# Patient Record
Sex: Female | Born: 1960 | Race: Black or African American | Hispanic: No | Smoking: Never smoker
Health system: Southern US, Community
[De-identification: ages and names within clinical notes are randomized; demographics above are authoritative.]

## PROBLEM LIST (undated history)

## (undated) DIAGNOSIS — K449 Diaphragmatic hernia without obstruction or gangrene: Secondary | ICD-10-CM

## (undated) DIAGNOSIS — K589 Irritable bowel syndrome without diarrhea: Secondary | ICD-10-CM

## (undated) DIAGNOSIS — K219 Gastro-esophageal reflux disease without esophagitis: Secondary | ICD-10-CM

## (undated) DIAGNOSIS — H66009 Acute suppurative otitis media without spontaneous rupture of ear drum, unspecified ear: Secondary | ICD-10-CM

## (undated) DIAGNOSIS — E8881 Metabolic syndrome: Secondary | ICD-10-CM

## (undated) DIAGNOSIS — M1990 Unspecified osteoarthritis, unspecified site: Secondary | ICD-10-CM

## (undated) DIAGNOSIS — I2510 Atherosclerotic heart disease of native coronary artery without angina pectoris: Secondary | ICD-10-CM

## (undated) DIAGNOSIS — D649 Anemia, unspecified: Secondary | ICD-10-CM

## (undated) DIAGNOSIS — T7840XA Allergy, unspecified, initial encounter: Secondary | ICD-10-CM

## (undated) DIAGNOSIS — R0600 Dyspnea, unspecified: Secondary | ICD-10-CM

## (undated) DIAGNOSIS — R160 Hepatomegaly, not elsewhere classified: Secondary | ICD-10-CM

## (undated) DIAGNOSIS — I5033 Acute on chronic diastolic (congestive) heart failure: Secondary | ICD-10-CM

## (undated) DIAGNOSIS — U071 COVID-19: Secondary | ICD-10-CM

## (undated) DIAGNOSIS — G43009 Migraine without aura, not intractable, without status migrainosus: Secondary | ICD-10-CM

## (undated) DIAGNOSIS — F419 Anxiety disorder, unspecified: Secondary | ICD-10-CM

## (undated) DIAGNOSIS — I5032 Chronic diastolic (congestive) heart failure: Secondary | ICD-10-CM

## (undated) DIAGNOSIS — G4733 Obstructive sleep apnea (adult) (pediatric): Secondary | ICD-10-CM

## (undated) DIAGNOSIS — K648 Other hemorrhoids: Secondary | ICD-10-CM

## (undated) DIAGNOSIS — J189 Pneumonia, unspecified organism: Secondary | ICD-10-CM

## (undated) DIAGNOSIS — J329 Chronic sinusitis, unspecified: Secondary | ICD-10-CM

## (undated) DIAGNOSIS — G4730 Sleep apnea, unspecified: Secondary | ICD-10-CM

## (undated) DIAGNOSIS — G5600 Carpal tunnel syndrome, unspecified upper limb: Secondary | ICD-10-CM

## (undated) DIAGNOSIS — I10 Essential (primary) hypertension: Secondary | ICD-10-CM

## (undated) DIAGNOSIS — I5030 Unspecified diastolic (congestive) heart failure: Secondary | ICD-10-CM

## (undated) DIAGNOSIS — K5792 Diverticulitis of intestine, part unspecified, without perforation or abscess without bleeding: Secondary | ICD-10-CM

## (undated) DIAGNOSIS — Z8679 Personal history of other diseases of the circulatory system: Secondary | ICD-10-CM

## (undated) DIAGNOSIS — E785 Hyperlipidemia, unspecified: Secondary | ICD-10-CM

## (undated) DIAGNOSIS — J449 Chronic obstructive pulmonary disease, unspecified: Secondary | ICD-10-CM

## (undated) DIAGNOSIS — U099 Post covid-19 condition, unspecified: Secondary | ICD-10-CM

## (undated) DIAGNOSIS — K760 Fatty (change of) liver, not elsewhere classified: Secondary | ICD-10-CM

## (undated) HISTORY — DX: Fatty (change of) liver, not elsewhere classified: K76.0

## (undated) HISTORY — DX: Allergy, unspecified, initial encounter: T78.40XA

## (undated) HISTORY — DX: Morbid (severe) obesity due to excess calories: E66.01

## (undated) HISTORY — DX: Gastro-esophageal reflux disease without esophagitis: K21.9

## (undated) HISTORY — DX: Chronic diastolic (congestive) heart failure: I50.32

## (undated) HISTORY — DX: Unspecified diastolic (congestive) heart failure: I50.30

## (undated) HISTORY — DX: Essential (primary) hypertension: I10

## (undated) HISTORY — DX: Anxiety disorder, unspecified: F41.9

## (undated) HISTORY — PX: UVULOPALATOPHARYNGOPLASTY: SHX827

## (undated) HISTORY — PX: TONSILLECTOMY: SUR1361

## (undated) HISTORY — PX: TUBAL LIGATION: SHX77

## (undated) HISTORY — PX: PARTIAL HYSTERECTOMY: SHX80

## (undated) HISTORY — DX: Atherosclerotic heart disease of native coronary artery without angina pectoris: I25.10

## (undated) HISTORY — DX: Carpal tunnel syndrome, unspecified upper limb: G56.00

## (undated) HISTORY — DX: Irritable bowel syndrome, unspecified: K58.9

## (undated) HISTORY — PX: NASAL SINUS SURGERY: SHX719

## (undated) HISTORY — DX: Obstructive sleep apnea (adult) (pediatric): G47.33

## (undated) HISTORY — PX: ABDOMINAL HYSTERECTOMY: SHX81

## (undated) HISTORY — DX: Diverticulitis of intestine, part unspecified, without perforation or abscess without bleeding: K57.92

## (undated) HISTORY — DX: Other hemorrhoids: K64.8

## (undated) HISTORY — DX: Unspecified osteoarthritis, unspecified site: M19.90

## (undated) HISTORY — DX: Diaphragmatic hernia without obstruction or gangrene: K44.9

## (undated) HISTORY — DX: Hepatomegaly, not elsewhere classified: R16.0

## (undated) HISTORY — PX: COLONOSCOPY: SHX174

## (undated) HISTORY — DX: COVID-19: U07.1

## (undated) HISTORY — DX: Hyperlipidemia, unspecified: E78.5

## (undated) HISTORY — DX: Anemia, unspecified: D64.9

## (undated) HISTORY — DX: Migraine without aura, not intractable, without status migrainosus: G43.009

## (undated) SURGERY — Surgical Case
Anesthesia: *Unknown

---

## 1996-06-22 ENCOUNTER — Encounter: Payer: Self-pay | Admitting: Internal Medicine

## 1997-07-06 ENCOUNTER — Emergency Department (HOSPITAL_COMMUNITY): Admission: EM | Admit: 1997-07-06 | Discharge: 1997-07-06 | Payer: Self-pay | Admitting: Emergency Medicine

## 1997-08-07 ENCOUNTER — Emergency Department (HOSPITAL_COMMUNITY): Admission: EM | Admit: 1997-08-07 | Discharge: 1997-08-07 | Payer: Self-pay

## 1998-02-11 ENCOUNTER — Emergency Department (HOSPITAL_COMMUNITY): Admission: EM | Admit: 1998-02-11 | Discharge: 1998-02-11 | Payer: Self-pay | Admitting: Emergency Medicine

## 1998-04-05 ENCOUNTER — Emergency Department (HOSPITAL_COMMUNITY): Admission: EM | Admit: 1998-04-05 | Discharge: 1998-04-05 | Payer: Self-pay | Admitting: Emergency Medicine

## 1998-04-20 ENCOUNTER — Emergency Department (HOSPITAL_COMMUNITY): Admission: EM | Admit: 1998-04-20 | Discharge: 1998-04-20 | Payer: Self-pay | Admitting: Emergency Medicine

## 1998-04-20 ENCOUNTER — Encounter: Payer: Self-pay | Admitting: Emergency Medicine

## 1998-05-06 ENCOUNTER — Emergency Department (HOSPITAL_COMMUNITY): Admission: EM | Admit: 1998-05-06 | Discharge: 1998-05-06 | Payer: Self-pay | Admitting: Emergency Medicine

## 1998-06-07 ENCOUNTER — Emergency Department (HOSPITAL_COMMUNITY): Admission: EM | Admit: 1998-06-07 | Discharge: 1998-06-07 | Payer: Self-pay | Admitting: Emergency Medicine

## 1998-08-18 ENCOUNTER — Encounter: Payer: Self-pay | Admitting: Endocrinology

## 1998-08-18 ENCOUNTER — Emergency Department (HOSPITAL_COMMUNITY): Admission: EM | Admit: 1998-08-18 | Discharge: 1998-08-18 | Payer: Self-pay | Admitting: Emergency Medicine

## 1998-11-23 ENCOUNTER — Encounter: Payer: Self-pay | Admitting: Internal Medicine

## 1998-11-23 ENCOUNTER — Encounter: Admission: RE | Admit: 1998-11-23 | Discharge: 1998-11-23 | Payer: Self-pay | Admitting: Internal Medicine

## 1999-04-01 ENCOUNTER — Emergency Department (HOSPITAL_COMMUNITY): Admission: EM | Admit: 1999-04-01 | Discharge: 1999-04-01 | Payer: Self-pay | Admitting: Emergency Medicine

## 1999-05-27 ENCOUNTER — Emergency Department (HOSPITAL_COMMUNITY): Admission: EM | Admit: 1999-05-27 | Discharge: 1999-05-27 | Payer: Self-pay | Admitting: Emergency Medicine

## 1999-06-19 ENCOUNTER — Emergency Department (HOSPITAL_COMMUNITY): Admission: EM | Admit: 1999-06-19 | Discharge: 1999-06-19 | Payer: Self-pay | Admitting: Internal Medicine

## 1999-11-29 ENCOUNTER — Ambulatory Visit (HOSPITAL_COMMUNITY): Admission: RE | Admit: 1999-11-29 | Discharge: 1999-11-30 | Payer: Self-pay | Admitting: *Deleted

## 1999-11-29 ENCOUNTER — Encounter: Payer: Self-pay | Admitting: *Deleted

## 1999-12-04 ENCOUNTER — Ambulatory Visit (HOSPITAL_BASED_OUTPATIENT_CLINIC_OR_DEPARTMENT_OTHER): Admission: RE | Admit: 1999-12-04 | Discharge: 1999-12-04 | Payer: Self-pay | Admitting: Internal Medicine

## 2000-01-19 ENCOUNTER — Emergency Department (HOSPITAL_COMMUNITY): Admission: EM | Admit: 2000-01-19 | Discharge: 2000-01-19 | Payer: Self-pay | Admitting: *Deleted

## 2000-01-20 ENCOUNTER — Emergency Department (HOSPITAL_COMMUNITY): Admission: EM | Admit: 2000-01-20 | Discharge: 2000-01-20 | Payer: Self-pay | Admitting: *Deleted

## 2000-01-31 ENCOUNTER — Emergency Department (HOSPITAL_COMMUNITY): Admission: EM | Admit: 2000-01-31 | Discharge: 2000-02-01 | Payer: Self-pay | Admitting: Emergency Medicine

## 2000-01-31 ENCOUNTER — Encounter: Payer: Self-pay | Admitting: Emergency Medicine

## 2001-04-13 ENCOUNTER — Encounter: Payer: Self-pay | Admitting: Internal Medicine

## 2001-04-13 ENCOUNTER — Ambulatory Visit (HOSPITAL_COMMUNITY): Admission: RE | Admit: 2001-04-13 | Discharge: 2001-04-13 | Payer: Self-pay | Admitting: Internal Medicine

## 2001-05-19 ENCOUNTER — Encounter: Payer: Self-pay | Admitting: Otolaryngology

## 2001-05-19 ENCOUNTER — Encounter: Admission: RE | Admit: 2001-05-19 | Discharge: 2001-05-19 | Payer: Self-pay | Admitting: Otolaryngology

## 2002-02-24 ENCOUNTER — Encounter: Admission: RE | Admit: 2002-02-24 | Discharge: 2002-02-24 | Payer: Self-pay | Admitting: Otolaryngology

## 2002-02-24 ENCOUNTER — Encounter: Payer: Self-pay | Admitting: Otolaryngology

## 2002-05-10 ENCOUNTER — Encounter: Payer: Self-pay | Admitting: Endocrinology

## 2002-05-10 ENCOUNTER — Ambulatory Visit (HOSPITAL_COMMUNITY): Admission: RE | Admit: 2002-05-10 | Discharge: 2002-05-10 | Payer: Self-pay | Admitting: Endocrinology

## 2002-07-19 ENCOUNTER — Encounter: Admission: RE | Admit: 2002-07-19 | Discharge: 2002-08-17 | Payer: Self-pay | Admitting: Orthopedic Surgery

## 2003-01-17 ENCOUNTER — Ambulatory Visit (HOSPITAL_BASED_OUTPATIENT_CLINIC_OR_DEPARTMENT_OTHER): Admission: RE | Admit: 2003-01-17 | Discharge: 2003-01-17 | Payer: Self-pay | Admitting: Critical Care Medicine

## 2003-01-17 ENCOUNTER — Encounter: Payer: Self-pay | Admitting: Pulmonary Disease

## 2003-04-23 ENCOUNTER — Emergency Department (HOSPITAL_COMMUNITY): Admission: EM | Admit: 2003-04-23 | Discharge: 2003-04-23 | Payer: Self-pay | Admitting: Emergency Medicine

## 2003-04-29 ENCOUNTER — Encounter (INDEPENDENT_AMBULATORY_CARE_PROVIDER_SITE_OTHER): Payer: Self-pay | Admitting: *Deleted

## 2003-04-29 ENCOUNTER — Ambulatory Visit (HOSPITAL_COMMUNITY): Admission: RE | Admit: 2003-04-29 | Discharge: 2003-04-30 | Payer: Self-pay | Admitting: Otolaryngology

## 2003-11-04 ENCOUNTER — Encounter: Payer: Self-pay | Admitting: Internal Medicine

## 2003-11-25 ENCOUNTER — Encounter: Admission: RE | Admit: 2003-11-25 | Discharge: 2003-11-25 | Payer: Self-pay | Admitting: Internal Medicine

## 2003-12-12 ENCOUNTER — Ambulatory Visit: Payer: Self-pay | Admitting: Endocrinology

## 2003-12-14 ENCOUNTER — Ambulatory Visit: Payer: Self-pay | Admitting: Endocrinology

## 2004-01-07 ENCOUNTER — Ambulatory Visit: Payer: Self-pay | Admitting: Family Medicine

## 2004-01-09 ENCOUNTER — Ambulatory Visit: Payer: Self-pay | Admitting: Endocrinology

## 2004-02-10 ENCOUNTER — Ambulatory Visit: Payer: Self-pay | Admitting: Endocrinology

## 2004-02-27 ENCOUNTER — Ambulatory Visit: Payer: Self-pay | Admitting: Endocrinology

## 2004-03-12 ENCOUNTER — Ambulatory Visit (HOSPITAL_COMMUNITY): Admission: RE | Admit: 2004-03-12 | Discharge: 2004-03-12 | Payer: Self-pay | Admitting: Internal Medicine

## 2004-03-12 ENCOUNTER — Ambulatory Visit: Payer: Self-pay | Admitting: Internal Medicine

## 2004-03-30 ENCOUNTER — Ambulatory Visit: Payer: Self-pay | Admitting: Cardiology

## 2004-03-31 ENCOUNTER — Ambulatory Visit: Payer: Self-pay | Admitting: Internal Medicine

## 2004-04-16 ENCOUNTER — Ambulatory Visit: Payer: Self-pay | Admitting: Cardiology

## 2004-04-23 ENCOUNTER — Ambulatory Visit: Payer: Self-pay | Admitting: Endocrinology

## 2004-05-16 ENCOUNTER — Encounter: Admission: RE | Admit: 2004-05-16 | Discharge: 2004-05-16 | Payer: Self-pay | Admitting: Endocrinology

## 2004-06-08 ENCOUNTER — Ambulatory Visit: Payer: Self-pay | Admitting: Endocrinology

## 2004-06-15 ENCOUNTER — Ambulatory Visit: Payer: Self-pay | Admitting: Cardiology

## 2004-06-28 ENCOUNTER — Ambulatory Visit: Payer: Self-pay | Admitting: Internal Medicine

## 2004-06-29 ENCOUNTER — Ambulatory Visit: Payer: Self-pay

## 2004-06-29 ENCOUNTER — Ambulatory Visit: Payer: Self-pay | Admitting: Cardiology

## 2004-07-06 ENCOUNTER — Ambulatory Visit: Payer: Self-pay | Admitting: Cardiology

## 2004-07-06 ENCOUNTER — Ambulatory Visit: Payer: Self-pay

## 2004-07-09 ENCOUNTER — Ambulatory Visit: Payer: Self-pay | Admitting: Cardiology

## 2004-07-16 ENCOUNTER — Ambulatory Visit: Payer: Self-pay | Admitting: Cardiology

## 2004-07-23 ENCOUNTER — Ambulatory Visit: Payer: Self-pay | Admitting: Cardiology

## 2004-07-31 ENCOUNTER — Ambulatory Visit: Payer: Self-pay | Admitting: Cardiology

## 2004-08-03 ENCOUNTER — Ambulatory Visit: Payer: Self-pay | Admitting: Endocrinology

## 2004-08-10 ENCOUNTER — Ambulatory Visit: Payer: Self-pay | Admitting: Endocrinology

## 2004-08-10 ENCOUNTER — Ambulatory Visit: Payer: Self-pay

## 2004-08-15 ENCOUNTER — Ambulatory Visit: Payer: Self-pay | Admitting: Cardiology

## 2004-08-17 ENCOUNTER — Ambulatory Visit: Payer: Self-pay | Admitting: Cardiology

## 2004-08-27 ENCOUNTER — Ambulatory Visit: Payer: Self-pay

## 2004-09-07 ENCOUNTER — Ambulatory Visit: Payer: Self-pay | Admitting: Pulmonary Disease

## 2004-09-10 ENCOUNTER — Ambulatory Visit: Payer: Self-pay | Admitting: Internal Medicine

## 2004-11-14 ENCOUNTER — Ambulatory Visit: Payer: Self-pay | Admitting: Endocrinology

## 2004-11-28 ENCOUNTER — Ambulatory Visit: Payer: Self-pay | Admitting: Endocrinology

## 2004-12-07 ENCOUNTER — Ambulatory Visit: Payer: Self-pay | Admitting: Cardiology

## 2005-01-14 ENCOUNTER — Emergency Department (HOSPITAL_COMMUNITY): Admission: EM | Admit: 2005-01-14 | Discharge: 2005-01-14 | Payer: Self-pay | Admitting: Emergency Medicine

## 2005-01-15 ENCOUNTER — Ambulatory Visit: Payer: Self-pay | Admitting: Endocrinology

## 2005-02-26 ENCOUNTER — Ambulatory Visit: Payer: Self-pay | Admitting: Endocrinology

## 2005-02-28 ENCOUNTER — Ambulatory Visit: Payer: Self-pay | Admitting: Internal Medicine

## 2005-03-11 ENCOUNTER — Ambulatory Visit: Payer: Self-pay | Admitting: Internal Medicine

## 2005-04-20 ENCOUNTER — Ambulatory Visit: Payer: Self-pay | Admitting: Family Medicine

## 2005-05-21 ENCOUNTER — Ambulatory Visit: Payer: Self-pay | Admitting: Internal Medicine

## 2005-06-07 ENCOUNTER — Ambulatory Visit: Payer: Self-pay | Admitting: Cardiology

## 2005-06-12 ENCOUNTER — Ambulatory Visit: Payer: Self-pay | Admitting: Endocrinology

## 2005-06-19 ENCOUNTER — Ambulatory Visit: Payer: Self-pay | Admitting: Cardiology

## 2005-08-02 ENCOUNTER — Ambulatory Visit: Payer: Self-pay | Admitting: Endocrinology

## 2005-08-07 ENCOUNTER — Ambulatory Visit: Payer: Self-pay | Admitting: Endocrinology

## 2005-08-23 ENCOUNTER — Ambulatory Visit: Payer: Self-pay | Admitting: Pulmonary Disease

## 2005-08-26 ENCOUNTER — Ambulatory Visit: Payer: Self-pay | Admitting: Internal Medicine

## 2005-08-30 ENCOUNTER — Ambulatory Visit: Payer: Self-pay | Admitting: Internal Medicine

## 2005-09-03 ENCOUNTER — Ambulatory Visit: Payer: Self-pay

## 2005-09-20 ENCOUNTER — Ambulatory Visit: Payer: Self-pay | Admitting: Cardiology

## 2005-11-11 ENCOUNTER — Ambulatory Visit: Payer: Self-pay | Admitting: Endocrinology

## 2005-12-11 ENCOUNTER — Ambulatory Visit: Payer: Self-pay | Admitting: Cardiology

## 2006-01-11 ENCOUNTER — Ambulatory Visit: Payer: Self-pay | Admitting: Internal Medicine

## 2006-01-13 ENCOUNTER — Ambulatory Visit: Payer: Self-pay | Admitting: Internal Medicine

## 2006-03-03 ENCOUNTER — Ambulatory Visit: Payer: Self-pay | Admitting: Endocrinology

## 2006-03-29 ENCOUNTER — Ambulatory Visit: Payer: Self-pay | Admitting: Family Medicine

## 2006-04-14 ENCOUNTER — Ambulatory Visit: Payer: Self-pay | Admitting: Endocrinology

## 2006-05-12 ENCOUNTER — Ambulatory Visit: Payer: Self-pay | Admitting: Endocrinology

## 2006-06-09 ENCOUNTER — Encounter: Payer: Self-pay | Admitting: Endocrinology

## 2006-06-09 DIAGNOSIS — G4733 Obstructive sleep apnea (adult) (pediatric): Secondary | ICD-10-CM | POA: Insufficient documentation

## 2006-06-09 DIAGNOSIS — I10 Essential (primary) hypertension: Secondary | ICD-10-CM | POA: Insufficient documentation

## 2006-06-09 DIAGNOSIS — J45909 Unspecified asthma, uncomplicated: Secondary | ICD-10-CM | POA: Insufficient documentation

## 2006-06-09 DIAGNOSIS — J309 Allergic rhinitis, unspecified: Secondary | ICD-10-CM | POA: Insufficient documentation

## 2006-06-09 DIAGNOSIS — G5600 Carpal tunnel syndrome, unspecified upper limb: Secondary | ICD-10-CM | POA: Insufficient documentation

## 2006-06-09 DIAGNOSIS — I252 Old myocardial infarction: Secondary | ICD-10-CM | POA: Insufficient documentation

## 2006-06-09 HISTORY — DX: Carpal tunnel syndrome, unspecified upper limb: G56.00

## 2006-08-04 ENCOUNTER — Ambulatory Visit: Payer: Self-pay | Admitting: Internal Medicine

## 2006-08-08 ENCOUNTER — Ambulatory Visit: Payer: Self-pay | Admitting: Endocrinology

## 2006-08-08 LAB — CONVERTED CEMR LAB
ALT: 20 units/L (ref 0–35)
AST: 23 units/L (ref 0–37)
Albumin: 3.3 g/dL — ABNORMAL LOW (ref 3.5–5.2)
Alkaline Phosphatase: 46 units/L (ref 39–117)
BUN: 9 mg/dL (ref 6–23)
Basophils Absolute: 0.1 10*3/uL (ref 0.0–0.1)
Basophils Relative: 0.6 % (ref 0.0–1.0)
Bilirubin Urine: NEGATIVE
Bilirubin, Direct: 0.1 mg/dL (ref 0.0–0.3)
CO2: 31 meq/L (ref 19–32)
Calcium: 9 mg/dL (ref 8.4–10.5)
Chloride: 104 meq/L (ref 96–112)
Cholesterol: 196 mg/dL (ref 0–200)
Creatinine, Ser: 0.5 mg/dL (ref 0.4–1.2)
Creatinine,U: 112.3 mg/dL
Eosinophils Absolute: 0.3 10*3/uL (ref 0.0–0.6)
Eosinophils Relative: 3.3 % (ref 0.0–5.0)
GFR calc Af Amer: 170 mL/min
GFR calc non Af Amer: 141 mL/min
Glucose, Bld: 94 mg/dL (ref 70–99)
HCT: 36.5 % (ref 36.0–46.0)
HDL: 42.9 mg/dL (ref >39.0)
Hemoglobin, Urine: NEGATIVE
Hemoglobin: 12.8 g/dL (ref 12.0–15.0)
Hgb A1c MFr Bld: 5.9 % (ref 4.6–6.0)
Ketones, ur: NEGATIVE mg/dL
LDL Cholesterol: 126 mg/dL — ABNORMAL HIGH (ref 0–99)
Leukocytes, UA: NEGATIVE
Lymphocytes Relative: 31 % (ref 12.0–46.0)
MCHC: 35.1 g/dL (ref 30.0–36.0)
MCV: 90.5 fL (ref 78.0–100.0)
Microalb Creat Ratio: 4.5 mg/g (ref 0.0–30.0)
Microalb, Ur: 0.5 mg/dL (ref 0.0–1.9)
Monocytes Absolute: 0.5 10*3/uL (ref 0.2–0.7)
Monocytes Relative: 4.9 % (ref 3.0–11.0)
Neutro Abs: 5.4 10*3/uL (ref 1.4–7.7)
Neutrophils Relative %: 60.2 % (ref 43.0–77.0)
Nitrite: NEGATIVE
Platelets: 257 10*3/uL (ref 150–400)
Potassium: 3.3 meq/L — ABNORMAL LOW (ref 3.5–5.1)
RBC: 4.03 M/uL (ref 3.87–5.11)
RDW: 12.8 % (ref 11.5–14.6)
Sodium: 141 meq/L (ref 135–145)
Specific Gravity, Urine: >=1.03 (ref 1.000–1.03)
TSH: 2.08 microintl units/mL (ref 0.35–5.50)
Total Bilirubin: 1 mg/dL (ref 0.3–1.2)
Total CHOL/HDL Ratio: 4.6
Total Protein, Urine: NEGATIVE mg/dL
Total Protein: 7.5 g/dL (ref 6.0–8.3)
Triglycerides: 134 mg/dL (ref 0–149)
Urine Glucose: NEGATIVE mg/dL
Urobilinogen, UA: 0.2 (ref 0.0–1.0)
VLDL: 27 mg/dL (ref 0–40)
WBC: 9.2 10*3/uL (ref 4.5–10.5)
pH: 5.5 (ref 5.0–8.0)

## 2006-08-11 ENCOUNTER — Ambulatory Visit: Payer: Self-pay | Admitting: Endocrinology

## 2006-08-19 ENCOUNTER — Ambulatory Visit: Payer: Self-pay | Admitting: Pulmonary Disease

## 2006-09-01 ENCOUNTER — Ambulatory Visit: Payer: Self-pay | Admitting: Endocrinology

## 2006-10-02 ENCOUNTER — Encounter: Payer: Self-pay | Admitting: Internal Medicine

## 2006-10-02 DIAGNOSIS — E785 Hyperlipidemia, unspecified: Secondary | ICD-10-CM | POA: Insufficient documentation

## 2006-10-04 ENCOUNTER — Ambulatory Visit: Payer: Self-pay | Admitting: Endocrinology

## 2006-10-21 ENCOUNTER — Ambulatory Visit: Payer: Self-pay | Admitting: Internal Medicine

## 2006-10-21 ENCOUNTER — Encounter: Payer: Self-pay | Admitting: Internal Medicine

## 2006-10-31 ENCOUNTER — Ambulatory Visit: Payer: Self-pay | Admitting: Internal Medicine

## 2006-10-31 ENCOUNTER — Encounter: Payer: Self-pay | Admitting: Internal Medicine

## 2006-10-31 DIAGNOSIS — M109 Gout, unspecified: Secondary | ICD-10-CM | POA: Insufficient documentation

## 2006-10-31 DIAGNOSIS — M722 Plantar fascial fibromatosis: Secondary | ICD-10-CM | POA: Insufficient documentation

## 2006-10-31 DIAGNOSIS — G43009 Migraine without aura, not intractable, without status migrainosus: Secondary | ICD-10-CM | POA: Insufficient documentation

## 2006-11-14 ENCOUNTER — Ambulatory Visit: Payer: Self-pay | Admitting: Pulmonary Disease

## 2006-11-21 ENCOUNTER — Encounter: Payer: Self-pay | Admitting: Internal Medicine

## 2006-12-13 ENCOUNTER — Ambulatory Visit: Payer: Self-pay | Admitting: Internal Medicine

## 2006-12-13 DIAGNOSIS — H6500 Acute serous otitis media, unspecified ear: Secondary | ICD-10-CM | POA: Insufficient documentation

## 2006-12-13 DIAGNOSIS — J019 Acute sinusitis, unspecified: Secondary | ICD-10-CM | POA: Insufficient documentation

## 2007-01-29 ENCOUNTER — Ambulatory Visit: Payer: Self-pay | Admitting: Endocrinology

## 2007-01-29 DIAGNOSIS — H60399 Other infective otitis externa, unspecified ear: Secondary | ICD-10-CM | POA: Insufficient documentation

## 2007-01-30 ENCOUNTER — Telehealth (INDEPENDENT_AMBULATORY_CARE_PROVIDER_SITE_OTHER): Payer: Self-pay | Admitting: *Deleted

## 2007-01-30 LAB — CONVERTED CEMR LAB: Hgb A1c MFr Bld: 5.8 % (ref 4.6–6.0)

## 2007-02-04 ENCOUNTER — Ambulatory Visit: Payer: Self-pay | Admitting: Endocrinology

## 2007-02-04 ENCOUNTER — Telehealth (INDEPENDENT_AMBULATORY_CARE_PROVIDER_SITE_OTHER): Payer: Self-pay | Admitting: *Deleted

## 2007-02-04 DIAGNOSIS — H66009 Acute suppurative otitis media without spontaneous rupture of ear drum, unspecified ear: Secondary | ICD-10-CM | POA: Insufficient documentation

## 2007-02-04 HISTORY — DX: Acute suppurative otitis media without spontaneous rupture of ear drum, unspecified ear: H66.009

## 2007-02-05 ENCOUNTER — Telehealth (INDEPENDENT_AMBULATORY_CARE_PROVIDER_SITE_OTHER): Payer: Self-pay | Admitting: *Deleted

## 2007-02-05 ENCOUNTER — Ambulatory Visit: Payer: Self-pay | Admitting: Internal Medicine

## 2007-02-06 ENCOUNTER — Telehealth (INDEPENDENT_AMBULATORY_CARE_PROVIDER_SITE_OTHER): Payer: Self-pay | Admitting: *Deleted

## 2007-02-07 ENCOUNTER — Ambulatory Visit: Payer: Self-pay | Admitting: Internal Medicine

## 2007-02-07 DIAGNOSIS — T887XXA Unspecified adverse effect of drug or medicament, initial encounter: Secondary | ICD-10-CM | POA: Insufficient documentation

## 2007-03-03 ENCOUNTER — Telehealth (INDEPENDENT_AMBULATORY_CARE_PROVIDER_SITE_OTHER): Payer: Self-pay | Admitting: *Deleted

## 2007-03-03 ENCOUNTER — Emergency Department (HOSPITAL_COMMUNITY): Admission: EM | Admit: 2007-03-03 | Discharge: 2007-03-04 | Payer: Self-pay | Admitting: Emergency Medicine

## 2007-03-05 ENCOUNTER — Ambulatory Visit: Payer: Self-pay | Admitting: Cardiology

## 2007-03-06 ENCOUNTER — Encounter: Admission: RE | Admit: 2007-03-06 | Discharge: 2007-03-06 | Payer: Self-pay | Admitting: Obstetrics and Gynecology

## 2007-03-06 LAB — CONVERTED CEMR LAB
BUN: 15 mg/dL (ref 6–23)
CO2: 32 meq/L (ref 19–32)
Calcium: 9.3 mg/dL (ref 8.4–10.5)
Chloride: 97 meq/L (ref 96–112)
Creatinine, Ser: 0.7 mg/dL (ref 0.4–1.2)
GFR calc Af Amer: 115 mL/min
GFR calc non Af Amer: 95 mL/min
Glucose, Bld: 95 mg/dL (ref 70–99)
Potassium: 3 meq/L — ABNORMAL LOW (ref 3.5–5.1)
Sodium: 137 meq/L (ref 135–145)

## 2007-03-13 ENCOUNTER — Encounter: Admission: RE | Admit: 2007-03-13 | Discharge: 2007-03-13 | Payer: Self-pay | Admitting: Obstetrics and Gynecology

## 2007-03-13 ENCOUNTER — Ambulatory Visit: Payer: Self-pay

## 2007-03-16 ENCOUNTER — Ambulatory Visit: Payer: Self-pay | Admitting: Internal Medicine

## 2007-03-16 LAB — CONVERTED CEMR LAB
BUN: 17 mg/dL (ref 6–23)
CO2: 29 meq/L (ref 19–32)
Calcium: 9.2 mg/dL (ref 8.4–10.5)
Chloride: 100 meq/L (ref 96–112)
Creatinine, Ser: 0.7 mg/dL (ref 0.4–1.2)
GFR calc Af Amer: 115 mL/min
GFR calc non Af Amer: 95 mL/min
Glucose, Bld: 96 mg/dL (ref 70–99)
Magnesium: 1.9 mg/dL (ref 1.5–2.5)
Potassium: 3.1 meq/L — ABNORMAL LOW (ref 3.5–5.1)
Sodium: 138 meq/L (ref 135–145)

## 2007-03-20 ENCOUNTER — Telehealth: Payer: Self-pay | Admitting: Endocrinology

## 2007-03-20 ENCOUNTER — Ambulatory Visit: Payer: Self-pay

## 2007-04-04 ENCOUNTER — Ambulatory Visit: Payer: Self-pay | Admitting: Family Medicine

## 2007-04-10 ENCOUNTER — Ambulatory Visit: Payer: Self-pay | Admitting: Cardiology

## 2007-04-10 ENCOUNTER — Encounter: Admission: RE | Admit: 2007-04-10 | Discharge: 2007-04-10 | Payer: Self-pay | Admitting: Otolaryngology

## 2007-04-10 LAB — CONVERTED CEMR LAB
BUN: 18 mg/dL (ref 6–23)
CO2: 34 meq/L — ABNORMAL HIGH (ref 19–32)
Calcium: 9.4 mg/dL (ref 8.4–10.5)
Chloride: 94 meq/L — ABNORMAL LOW (ref 96–112)
Creatinine, Ser: 0.8 mg/dL (ref 0.4–1.2)
GFR calc Af Amer: 98 mL/min
GFR calc non Af Amer: 81 mL/min
Glucose, Bld: 120 mg/dL — ABNORMAL HIGH (ref 70–99)
Potassium: 2.6 meq/L — CL (ref 3.5–5.1)
Sodium: 137 meq/L (ref 135–145)

## 2007-04-13 ENCOUNTER — Ambulatory Visit: Payer: Self-pay | Admitting: Cardiology

## 2007-04-13 LAB — CONVERTED CEMR LAB: Potassium: 3 meq/L — ABNORMAL LOW (ref 3.5–5.1)

## 2007-04-16 ENCOUNTER — Ambulatory Visit: Payer: Self-pay | Admitting: Cardiology

## 2007-04-16 LAB — CONVERTED CEMR LAB
Potassium: 3 meq/L — ABNORMAL LOW (ref 3.5–5.1)
Potassium: 2.7 meq/L — CL (ref 3.5–5.1)

## 2007-04-17 ENCOUNTER — Ambulatory Visit: Payer: Self-pay | Admitting: Endocrinology

## 2007-04-21 ENCOUNTER — Ambulatory Visit: Payer: Self-pay | Admitting: Cardiology

## 2007-04-27 ENCOUNTER — Ambulatory Visit: Payer: Self-pay | Admitting: Endocrinology

## 2007-04-29 ENCOUNTER — Ambulatory Visit: Payer: Self-pay | Admitting: Endocrinology

## 2007-05-29 ENCOUNTER — Ambulatory Visit: Payer: Self-pay | Admitting: Endocrinology

## 2007-06-04 ENCOUNTER — Telehealth: Payer: Self-pay | Admitting: Endocrinology

## 2007-06-05 ENCOUNTER — Telehealth: Payer: Self-pay | Admitting: Family Medicine

## 2007-06-05 ENCOUNTER — Telehealth (INDEPENDENT_AMBULATORY_CARE_PROVIDER_SITE_OTHER): Payer: Self-pay | Admitting: *Deleted

## 2007-07-17 ENCOUNTER — Telehealth (INDEPENDENT_AMBULATORY_CARE_PROVIDER_SITE_OTHER): Payer: Self-pay | Admitting: *Deleted

## 2007-07-19 ENCOUNTER — Encounter: Payer: Self-pay | Admitting: Endocrinology

## 2007-07-20 ENCOUNTER — Ambulatory Visit: Payer: Self-pay | Admitting: Endocrinology

## 2007-07-20 ENCOUNTER — Telehealth (INDEPENDENT_AMBULATORY_CARE_PROVIDER_SITE_OTHER): Payer: Self-pay | Admitting: *Deleted

## 2007-07-20 DIAGNOSIS — R209 Unspecified disturbances of skin sensation: Secondary | ICD-10-CM | POA: Insufficient documentation

## 2007-07-20 DIAGNOSIS — I509 Heart failure, unspecified: Secondary | ICD-10-CM | POA: Insufficient documentation

## 2007-07-22 LAB — CONVERTED CEMR LAB
Calcium: 9.2 mg/dL (ref 8.4–10.5)
Creatinine, Ser: 0.7 mg/dL (ref 0.4–1.2)
Folate: 6.6 ng/mL
GFR calc Af Amer: 115 mL/min
Glucose, Bld: 88 mg/dL (ref 70–99)
Pro B Natriuretic peptide (BNP): 15 pg/mL (ref 0.0–100.0)
Sodium: 139 meq/L (ref 135–145)

## 2007-07-30 ENCOUNTER — Ambulatory Visit: Payer: Self-pay | Admitting: Cardiology

## 2007-07-30 ENCOUNTER — Telehealth: Payer: Self-pay | Admitting: Endocrinology

## 2007-07-30 LAB — CONVERTED CEMR LAB
Calcium: 9.2 mg/dL (ref 8.4–10.5)
Creatinine, Ser: 0.7 mg/dL (ref 0.4–1.2)
GFR calc Af Amer: 115 mL/min
Glucose, Bld: 103 mg/dL — ABNORMAL HIGH (ref 70–99)
Sodium: 139 meq/L (ref 135–145)

## 2007-08-05 ENCOUNTER — Ambulatory Visit: Payer: Self-pay | Admitting: Endocrinology

## 2007-08-05 DIAGNOSIS — E876 Hypokalemia: Secondary | ICD-10-CM | POA: Insufficient documentation

## 2007-08-05 DIAGNOSIS — S40269A Insect bite (nonvenomous) of unspecified shoulder, initial encounter: Secondary | ICD-10-CM | POA: Insufficient documentation

## 2007-08-05 DIAGNOSIS — W57XXXA Bitten or stung by nonvenomous insect and other nonvenomous arthropods, initial encounter: Secondary | ICD-10-CM

## 2007-08-07 LAB — CONVERTED CEMR LAB
BUN: 12 mg/dL (ref 6–23)
Calcium: 9.4 mg/dL (ref 8.4–10.5)
GFR calc Af Amer: 115 mL/min
GFR calc non Af Amer: 95 mL/min
Potassium: 3.5 meq/L (ref 3.5–5.1)

## 2007-08-15 ENCOUNTER — Ambulatory Visit: Payer: Self-pay | Admitting: Family Medicine

## 2007-09-17 ENCOUNTER — Ambulatory Visit: Payer: Self-pay | Admitting: Endocrinology

## 2007-09-17 LAB — CONVERTED CEMR LAB
ALT: 22 units/L (ref 0–35)
AST: 25 units/L (ref 0–37)
Basophils Relative: 0.9 % (ref 0.0–3.0)
Bilirubin, Direct: 0.1 mg/dL (ref 0.0–0.3)
CO2: 29 meq/L (ref 19–32)
Calcium: 8.9 mg/dL (ref 8.4–10.5)
Chloride: 107 meq/L (ref 96–112)
Creatinine, Ser: 0.6 mg/dL (ref 0.4–1.2)
Eosinophils Relative: 3.6 % (ref 0.0–5.0)
Glucose, Bld: 107 mg/dL — ABNORMAL HIGH (ref 70–99)
Hemoglobin: 12.1 g/dL (ref 12.0–15.0)
Ketones, ur: NEGATIVE mg/dL
LDL Cholesterol: 116 mg/dL — ABNORMAL HIGH (ref 0–99)
Leukocytes, UA: NEGATIVE
Lymphocytes Relative: 31 % (ref 12.0–46.0)
Monocytes Relative: 5 % (ref 3.0–12.0)
Neutro Abs: 5.3 10*3/uL (ref 1.4–7.7)
Neutrophils Relative %: 59.5 % (ref 43.0–77.0)
Nitrite: NEGATIVE
RBC: 3.7 M/uL — ABNORMAL LOW (ref 3.87–5.11)
Specific Gravity, Urine: 1.025 (ref 1.000–1.03)
TSH: 1.56 microintl units/mL (ref 0.35–5.50)
Total Bilirubin: 0.9 mg/dL (ref 0.3–1.2)
Total CHOL/HDL Ratio: 4
Total Protein: 7.4 g/dL (ref 6.0–8.3)
Triglycerides: 145 mg/dL (ref 0–149)
Urobilinogen, UA: 0.2 (ref 0.0–1.0)
WBC: 8.8 10*3/uL (ref 4.5–10.5)
pH: 5.5 (ref 5.0–8.0)

## 2007-09-21 ENCOUNTER — Ambulatory Visit: Payer: Self-pay | Admitting: Endocrinology

## 2007-09-24 ENCOUNTER — Encounter: Payer: Self-pay | Admitting: Endocrinology

## 2007-10-01 ENCOUNTER — Encounter (INDEPENDENT_AMBULATORY_CARE_PROVIDER_SITE_OTHER): Payer: Self-pay | Admitting: *Deleted

## 2007-10-01 ENCOUNTER — Ambulatory Visit: Payer: Self-pay | Admitting: Endocrinology

## 2007-10-12 ENCOUNTER — Telehealth (INDEPENDENT_AMBULATORY_CARE_PROVIDER_SITE_OTHER): Payer: Self-pay | Admitting: *Deleted

## 2007-10-13 ENCOUNTER — Telehealth (INDEPENDENT_AMBULATORY_CARE_PROVIDER_SITE_OTHER): Payer: Self-pay | Admitting: *Deleted

## 2007-10-13 ENCOUNTER — Ambulatory Visit: Payer: Self-pay | Admitting: Endocrinology

## 2007-10-19 ENCOUNTER — Ambulatory Visit: Payer: Self-pay | Admitting: Endocrinology

## 2007-10-29 ENCOUNTER — Ambulatory Visit: Payer: Self-pay | Admitting: Internal Medicine

## 2007-10-29 LAB — CONVERTED CEMR LAB
Eosinophils Absolute: 0.3 10*3/uL (ref 0.0–0.7)
Eosinophils Relative: 3.6 % (ref 0.0–5.0)
HCT: 34.4 % — ABNORMAL LOW (ref 36.0–46.0)
IgE (Immunoglobulin E), Serum: 30.5 intl units/mL (ref 0.0–180.0)
MCV: 94.1 fL (ref 78.0–100.0)
Monocytes Absolute: 0.6 10*3/uL (ref 0.1–1.0)
Neutrophils Relative %: 58.6 % (ref 43.0–77.0)
Platelets: 272 10*3/uL (ref 150–400)
RDW: 13 % (ref 11.5–14.6)
WBC: 9 10*3/uL (ref 4.5–10.5)

## 2007-11-21 ENCOUNTER — Ambulatory Visit: Payer: Self-pay | Admitting: Family Medicine

## 2007-12-22 ENCOUNTER — Ambulatory Visit: Payer: Self-pay | Admitting: Endocrinology

## 2007-12-22 LAB — CONVERTED CEMR LAB: Hgb A1c MFr Bld: 6.2 % — ABNORMAL HIGH (ref 4.6–6.0)

## 2007-12-25 ENCOUNTER — Telehealth: Payer: Self-pay | Admitting: Endocrinology

## 2007-12-26 ENCOUNTER — Ambulatory Visit: Payer: Self-pay | Admitting: Family Medicine

## 2008-01-05 ENCOUNTER — Ambulatory Visit: Payer: Self-pay | Admitting: Family Medicine

## 2008-03-11 ENCOUNTER — Ambulatory Visit: Payer: Self-pay | Admitting: Endocrinology

## 2008-03-18 ENCOUNTER — Ambulatory Visit: Payer: Self-pay | Admitting: Endocrinology

## 2008-04-09 ENCOUNTER — Telehealth: Payer: Self-pay | Admitting: Family Medicine

## 2008-04-11 ENCOUNTER — Ambulatory Visit: Payer: Self-pay | Admitting: Endocrinology

## 2008-04-11 ENCOUNTER — Telehealth: Payer: Self-pay | Admitting: Family Medicine

## 2008-04-11 DIAGNOSIS — M79609 Pain in unspecified limb: Secondary | ICD-10-CM | POA: Insufficient documentation

## 2008-04-15 ENCOUNTER — Encounter: Admission: RE | Admit: 2008-04-15 | Discharge: 2008-04-15 | Payer: Self-pay | Admitting: Obstetrics and Gynecology

## 2008-04-15 ENCOUNTER — Ambulatory Visit: Payer: Self-pay

## 2008-04-27 ENCOUNTER — Ambulatory Visit: Payer: Self-pay | Admitting: Pulmonary Disease

## 2008-05-03 ENCOUNTER — Telehealth (INDEPENDENT_AMBULATORY_CARE_PROVIDER_SITE_OTHER): Payer: Self-pay | Admitting: *Deleted

## 2008-05-05 ENCOUNTER — Ambulatory Visit: Payer: Self-pay | Admitting: Endocrinology

## 2008-05-05 ENCOUNTER — Ambulatory Visit: Payer: Self-pay | Admitting: Internal Medicine

## 2008-05-08 ENCOUNTER — Encounter: Payer: Self-pay | Admitting: Endocrinology

## 2008-05-09 ENCOUNTER — Encounter: Payer: Self-pay | Admitting: Endocrinology

## 2008-05-20 ENCOUNTER — Telehealth (INDEPENDENT_AMBULATORY_CARE_PROVIDER_SITE_OTHER): Payer: Self-pay | Admitting: *Deleted

## 2008-07-14 ENCOUNTER — Encounter: Payer: Self-pay | Admitting: Pulmonary Disease

## 2008-07-19 ENCOUNTER — Telehealth: Payer: Self-pay | Admitting: Pulmonary Disease

## 2008-09-05 ENCOUNTER — Telehealth: Payer: Self-pay | Admitting: Cardiology

## 2008-09-14 ENCOUNTER — Ambulatory Visit: Payer: Self-pay | Admitting: Cardiology

## 2008-09-19 ENCOUNTER — Ambulatory Visit: Payer: Self-pay | Admitting: Cardiology

## 2008-09-21 LAB — CONVERTED CEMR LAB
Calcium: 9.7 mg/dL (ref 8.4–10.5)
Creatinine, Ser: 0.9 mg/dL (ref 0.4–1.2)
GFR calc non Af Amer: 85.29 mL/min (ref >60)
Sodium: 140 meq/L (ref 135–145)

## 2008-09-22 ENCOUNTER — Ambulatory Visit: Payer: Self-pay | Admitting: Cardiology

## 2008-09-22 LAB — CONVERTED CEMR LAB
Chloride: 101 meq/L (ref 96–112)
GFR calc non Af Amer: 136.18 mL/min (ref >60)
Potassium: 3.2 meq/L — ABNORMAL LOW (ref 3.5–5.1)
Sodium: 138 meq/L (ref 135–145)

## 2008-09-30 ENCOUNTER — Ambulatory Visit: Payer: Self-pay | Admitting: Cardiology

## 2008-10-05 LAB — CONVERTED CEMR LAB
CO2: 22 meq/L (ref 19–32)
Calcium: 9.6 mg/dL (ref 8.4–10.5)
Chloride: 104 meq/L (ref 96–112)
Glucose, Bld: 91 mg/dL (ref 70–99)
Potassium: 3.3 meq/L — ABNORMAL LOW (ref 3.5–5.3)
Sodium: 140 meq/L (ref 135–145)

## 2008-11-08 ENCOUNTER — Telehealth: Payer: Self-pay | Admitting: Cardiology

## 2008-11-12 ENCOUNTER — Emergency Department (HOSPITAL_COMMUNITY): Admission: EM | Admit: 2008-11-12 | Discharge: 2008-11-12 | Payer: Self-pay | Admitting: Emergency Medicine

## 2008-11-22 ENCOUNTER — Telehealth (INDEPENDENT_AMBULATORY_CARE_PROVIDER_SITE_OTHER): Payer: Self-pay | Admitting: *Deleted

## 2008-12-08 ENCOUNTER — Telehealth (INDEPENDENT_AMBULATORY_CARE_PROVIDER_SITE_OTHER): Payer: Self-pay | Admitting: *Deleted

## 2009-02-25 ENCOUNTER — Emergency Department (HOSPITAL_COMMUNITY): Admission: EM | Admit: 2009-02-25 | Discharge: 2009-02-25 | Payer: Self-pay | Admitting: Emergency Medicine

## 2009-02-27 ENCOUNTER — Telehealth (INDEPENDENT_AMBULATORY_CARE_PROVIDER_SITE_OTHER): Payer: Self-pay | Admitting: *Deleted

## 2009-03-01 ENCOUNTER — Ambulatory Visit: Payer: Self-pay | Admitting: Pulmonary Disease

## 2009-03-01 ENCOUNTER — Telehealth: Payer: Self-pay | Admitting: Internal Medicine

## 2009-03-01 DIAGNOSIS — J189 Pneumonia, unspecified organism: Secondary | ICD-10-CM

## 2009-03-01 HISTORY — DX: Pneumonia, unspecified organism: J18.9

## 2009-03-02 ENCOUNTER — Telehealth (INDEPENDENT_AMBULATORY_CARE_PROVIDER_SITE_OTHER): Payer: Self-pay | Admitting: *Deleted

## 2009-03-13 ENCOUNTER — Ambulatory Visit: Payer: Self-pay | Admitting: Pulmonary Disease

## 2009-03-16 ENCOUNTER — Encounter (INDEPENDENT_AMBULATORY_CARE_PROVIDER_SITE_OTHER): Payer: Self-pay | Admitting: *Deleted

## 2009-04-10 ENCOUNTER — Ambulatory Visit: Payer: Self-pay | Admitting: Cardiology

## 2009-05-10 ENCOUNTER — Encounter: Admission: RE | Admit: 2009-05-10 | Discharge: 2009-05-10 | Payer: Self-pay | Admitting: Obstetrics and Gynecology

## 2009-05-11 ENCOUNTER — Emergency Department (HOSPITAL_COMMUNITY): Admission: EM | Admit: 2009-05-11 | Discharge: 2009-05-11 | Payer: Self-pay | Admitting: Emergency Medicine

## 2009-05-17 ENCOUNTER — Encounter: Admission: RE | Admit: 2009-05-17 | Discharge: 2009-05-17 | Payer: Self-pay | Admitting: Obstetrics and Gynecology

## 2009-09-04 ENCOUNTER — Emergency Department (HOSPITAL_COMMUNITY): Admission: EM | Admit: 2009-09-04 | Discharge: 2009-09-04 | Payer: Self-pay | Admitting: Emergency Medicine

## 2009-10-30 ENCOUNTER — Encounter: Admission: RE | Admit: 2009-10-30 | Discharge: 2009-10-30 | Payer: Self-pay | Admitting: Obstetrics and Gynecology

## 2009-11-02 ENCOUNTER — Encounter (INDEPENDENT_AMBULATORY_CARE_PROVIDER_SITE_OTHER): Payer: Self-pay | Admitting: *Deleted

## 2009-11-02 ENCOUNTER — Emergency Department (HOSPITAL_COMMUNITY)
Admission: EM | Admit: 2009-11-02 | Discharge: 2009-11-02 | Payer: Self-pay | Source: Home / Self Care | Admitting: Emergency Medicine

## 2009-11-03 ENCOUNTER — Telehealth: Payer: Self-pay | Admitting: Internal Medicine

## 2009-11-06 ENCOUNTER — Ambulatory Visit: Payer: Self-pay | Admitting: Gastroenterology

## 2009-11-06 DIAGNOSIS — R1012 Left upper quadrant pain: Secondary | ICD-10-CM | POA: Insufficient documentation

## 2009-11-07 ENCOUNTER — Telehealth: Payer: Self-pay | Admitting: Physician Assistant

## 2009-11-09 ENCOUNTER — Telehealth: Payer: Self-pay | Admitting: Internal Medicine

## 2009-12-02 ENCOUNTER — Emergency Department (HOSPITAL_COMMUNITY): Admission: EM | Admit: 2009-12-02 | Discharge: 2009-12-02 | Payer: Self-pay | Admitting: Emergency Medicine

## 2009-12-25 ENCOUNTER — Ambulatory Visit: Payer: Self-pay | Admitting: Cardiology

## 2010-01-02 ENCOUNTER — Emergency Department (HOSPITAL_COMMUNITY)
Admission: EM | Admit: 2010-01-02 | Discharge: 2010-01-02 | Payer: Self-pay | Source: Home / Self Care | Admitting: Emergency Medicine

## 2010-01-02 ENCOUNTER — Telehealth: Payer: Self-pay | Admitting: Cardiology

## 2010-01-12 ENCOUNTER — Emergency Department (HOSPITAL_COMMUNITY)
Admission: EM | Admit: 2010-01-12 | Discharge: 2010-01-12 | Payer: Self-pay | Source: Home / Self Care | Admitting: Emergency Medicine

## 2010-01-16 ENCOUNTER — Emergency Department (HOSPITAL_COMMUNITY)
Admission: EM | Admit: 2010-01-16 | Discharge: 2010-01-16 | Payer: Self-pay | Source: Home / Self Care | Admitting: Emergency Medicine

## 2010-02-10 ENCOUNTER — Encounter: Payer: Self-pay | Admitting: Endocrinology

## 2010-02-10 ENCOUNTER — Encounter: Payer: Self-pay | Admitting: Internal Medicine

## 2010-02-12 ENCOUNTER — Emergency Department (HOSPITAL_COMMUNITY)
Admission: EM | Admit: 2010-02-12 | Discharge: 2010-02-12 | Payer: Self-pay | Source: Home / Self Care | Admitting: Emergency Medicine

## 2010-02-18 LAB — CONVERTED CEMR LAB
CO2: 30 meq/L (ref 19–32)
Calcium: 9.2 mg/dL (ref 8.4–10.5)
Creatinine, Ser: 0.6 mg/dL (ref 0.4–1.2)
Sodium: 141 meq/L (ref 135–145)

## 2010-02-20 NOTE — Progress Notes (Signed)
Summary: DRUG INTERACTION  Phone Note Call from Patient Call back at [phone]   Caller: [facility] Folks Call For: [email or web address] Reason for Call: Talk to Nurse Summary of Call: DRUG INTERACTION --- Avelox 400mg  , pt still has 1 day left on zpack they are on.  Do you want pt to delay Avelox? Initial call taken by: Eugene Gavia,  March 01, 2009 4:26 PM  Follow-up for Phone Call        ok, she can have doxycycline 100mg  by mouth two times a day x 6 days after meals instead of avelox Follow-up by: Kalman Shan MD,  March 01, 2009 4:35 PM  Additional Follow-up for Phone Call Additional follow up Details #1::        pharmacy notified. Carron Curie CMA  March 01, 2009 4:47 PM

## 2010-02-20 NOTE — Letter (Signed)
Summary: Appointment - Missed  [facility]     [place], [place]    Phone:   Fax:      March 16, 2009 MRN: [id number]   [facility] [address] [place], [place]  [address]   Dear Ms. Funez,  Our records indicate you missed your appointment on 03-14-2009  with  Dr.  Antoine Poche    It is very important that we reach you to reschedule this appointment. We look forward to participating in your health care needs. Please contact us at the number listed above at your earliest convenience to reschedule this appointment.     Sincerely,     Lorne Skeens   [facility] Scheduling Team

## 2010-02-20 NOTE — Progress Notes (Signed)
Summary: Wants to be seen this evening   Phone Note Call from Patient Call back at Home Phone [phone]   Call For: Dr Marina Goodell  Summary of Call: Left side of abd hurts says she went to ER. Wants to be seen this evening.Told me she had a Colon25yr ago with Dr Marina Goodell but I only see the one in [facility] back in 2005 no recent office visit since 2002.  Initial call taken by: Leanor Kail [facility],  November 03, 2009 9:33 AM  Follow-up for Phone Call        pt has had continued abd pain since ER visit. She says she was told she has Colitis.  She says she had colon with Dr Marina Goodell 1 year ago but no record of it is in [place] or EMR.  The char has been ordered.  An appt was offered to the pt for today with Amy but she declined due to transportation issues.  She was scheduled for Monday. Follow-up by: Chales Abrahams CMA Duncan Dull),  November 03, 2009 9:56 AM

## 2010-02-20 NOTE — Assessment & Plan Note (Signed)
Summary: HFU/ COUGH/ RECENT PNA ///kp   Visit Type:  Hospital Follow-up Copy to:  Dr Valma Cava ER Primary Provider/Referring Provider:  Dr Antoine Poche - cardiology., Dr. Shelle Iron - sleep  CC:  Pt here for hospital follow up. Pt c/o intermittent productive cough with small amounts of thick yellow mucus. Pt c/o rib pain from coughing .  History of Present Illness: IOV 03/01/2009. 50 year old obese, non-smoking female, with family hx of asthma, OSA and with prior hx of atypical chest pains and very minimal CAD. Went to [place] [facility] on 02/25/2009 with sudden onset of cough with sputum for 2 days and chills.  cXR showed RLL pneumonia. Was given 1 dose of IM antibioic and discharged on Zpak (today is day 5 of zpak).Iin office she could not complete her spirometry due to cough and in walking her she got wheezy.  Given doxy --> due to qT interaction with avelox & z-pak.  March 13, 2009 4:01 PM  Took doxy  - did not help, green phlegm +, occasional whezing but overall better. No fevers, chills, dyspnea improved  Current Medications (verified): 1)  Catapres 0.2 Mg  Tabs (Clonidine Hcl) .... Two Times A Day 2)  Furosemide 40 Mg Tabs (Furosemide) .... One By Mouth Two Times A Day 3)  Potassium Chloride Crys Cr 20 Meq Cr-Tabs (Potassium Chloride Crys Cr) .... Take 2 Tablets Daily 4)  Hyzaar 50-12.5 Mg Tabs (Losartan Potassium-Hctz) .... One By Mouth Once Daily 5)  Proair Hfa 108 (90 Base) Mcg/act Aers (Albuterol Sulfate) .... As Needed  Allergies (verified): 1)  ! Sulfa 2)  ! Neomycin 3)  ! Cipro 4)  ! Vicodin 5)  ! * Cortisporn 6)  ! * Hydrocortisone 7)  Doxycycline  Past History:  Past Medical History: Last updated: 09/14/2008 ROUTINE GENERAL MEDICAL [email or web address]  CARE FACL (ICD-V70.0) INSECT BITE, UPPER ARM (ICD-912.4) HYPOKALEMIA (ICD-276.8) NUMBNESS (ICD-782.0) CHF (ICD-428.0) (Preserved EF) UNS ADVRS EFF UNS RX MEDICINAL&BIOLOGICAL SBSTNC (ICD-995.20) OTITIS MEDIA, SUPPURATIVE, ACUTE,  BILATERAL (ICD-382.00) EXTERNAL OTITIS (ICD-380.10) SINUSITIS- ACUTE-NOS (ICD-461.9) OTITIS MEDIA, SEROUS, ACUTE (ICD-381.01) PLANTAR FASCIITIS (ICD-728.71) FAMILY HISTORY OF CAD FEMALE 1ST DEGREE RELATIVE <50 (ICD-V17.3) COMMON MIGRAINE (ICD-346.10) GOUT (ICD-274.9) MORBID OBESITY (ICD-278.01) DIABETES MELLITUS, TYPE II (ICD-250.00) HYPERLIPIDEMIA (ICD-272.4) STATUS, STERILIZATION, TUBAL LIGATION (ICD-V26.51) OBSTRUCTIVE SLEEP APNEA (ICD-327.23)- Dr. 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  The patient denies anorexia, fever, weight loss, weight gain, vision loss, decreased hearing, hoarseness, chest pain, syncope, dyspnea on exertion, peripheral edema, headaches, hemoptysis, abdominal pain, melena, hematochezia, severe indigestion/heartburn, hematuria, muscle weakness, suspicious skin lesions, difficulty walking, depression, unusual weight change, and abnormal bleeding.    Vital Signs:  Patient profile:   50 year old female Height:      63 inches Weight:      293 pounds O2 Sat:      97 % on Room air Temp:     97.8 degrees F oral Pulse rate:   76 / minute BP sitting:  160 / 94  (left arm) Cuff size:   large  Vitals Entered By: Zackery Barefoot CMA (March 13, 2009 3:41 PM)  O2 Flow:   Room air CC: Pt here for hospital follow up. Pt c/o intermittent productive cough with small amounts of thick yellow mucus. Pt c/o rib pain from coughing  Comments Medications reviewed with patient Verified contact number and pharmacy with patient Zackery Barefoot CMA  March 13, 2009 3:43 PM    Physical Exam  Additional Exam:  General: A/Ox3; pleasant and cooperative, NAD, obese SKIN: no rash, lesions NODES: no lymphadenopathy HEENT: [place]/AT, EOM- WNL, Conjuctivae- clear, PERRLA, TM-WNL, Nose- clear, Throat- clear and wnl, Melampatti III,  no stridor or erythema NECK: ,fat,JVD- none, normal carotid impulses w/o bruits Thyroid-  CHEST: Clear to P&A, decreased rt base HEART: RRR, no m/g/r heard ABDOMEN:  ZOX:WRUE, nl pulses, no edema         CXR  Procedure date:  03/13/2009  Findings:      Findings: There is cardiomegaly with vascular congestion.  No focal opacities currently.  No effusions or evidence of edema.  No acute bony abnormality.   IMPRESSION: Cardiomegaly, vascular congestion.  Impression & Recommendations:  Problem # 1:  PNEUMONIA, ORGAN UNSPECIFIED (ICD-486) -resolved on CXR but remians symptomatic, fear inadequate Abx Rx - will use avelox x 5 more days. The following medications were removed from the medication list:    Azithromycin 250 Mg Tabs (Azithromycin) .[name]... 2 tablets first day, one tablet daily until gone Her updated medication list for this problem includes:    Avelox 400 Mg Tabs (Moxifloxacin hcl) ..... Once daily  Orders: T-2 View CXR (71020TC) Est. Patient Level III (45409) Prescription Created Electronically [phone])  Problem # 2:  ASTHMA (ICD-493.90) no bspasm today Use cheratussin for cough. rescue inhaler as needed   Medications Added to Medication List This Visit: 1)  Potassium Chloride Crys Cr 20 Meq Cr-tabs (Potassium chloride crys cr) .... Take 2 tablets daily 2)  Avelox 400 Mg Tabs (Moxifloxacin hcl) .... Once daily 3)   Cheratussin Ac 100-10 Mg/84ml Syrp (Guaifenesin-codeine) .... 2.5-5 ml by mouth two times a day as needed  Patient Instructions: 1)  Please schedule a follow-up appointment in 2 months with dr Shelle Iron 2)  Take avelox x 5 days 3)  Cough syrup with codeine at bedtime  Prescriptions: CHERATUSSIN AC 100-10 MG/5ML SYRP (GUAIFENESIN-CODEINE) 2.5-5 ml by mouth two times a day as needed  #120 x 0   Entered and Authorized by:   Comer Locket Vassie Loll MD   Signed by:   Comer Locket Vassie Loll MD on 03/13/2009   Method used:   Print then Give to Patient   RxID:   [id number] AVELOX 400 MG TABS (MOXIFLOXACIN HCL) once daily  #5 x 0   Entered and Authorized by:   Comer Locket. Vassie Loll MD   Signed by:   Comer Locket Vassie Loll MD on 03/13/2009   Method used:   Electronically to        [id number] (retail)       [address] [place], [place]  [address]       Ph: [phone]       Fax: [phone]   RxID:   [id number]

## 2010-02-20 NOTE — Procedures (Signed)
Summary: [facility] COLON   Colonoscopy  Procedure date:  11/04/2003  Findings:      Location:  [facility].   Patient Name: [name], [name] MRN:  Procedure Procedures: Colonoscopy CPT: [phone].  Personnel: Endoscopist: Wilhemina Bonito. Marina Goodell, MD.  Referred By: Rosalyn Gess. Norins, MD.  Exam Location: Exam performed in Outpatient Clinic. Outpatient  Patient Consent: Procedure, Alternatives, Risks and Benefits discussed, consent obtained, from patient. Consent was obtained by the RN.  Indications Symptoms: Hematochezia.  History  Current Medications: Patient is not currently taking Coumadin.  Comments: Normal colonoscopy in 1998 Pre-Exam Physical: Performed Nov 04, 2003. Entire physical exam was normal.  Exam Exam: Extent of exam reached: Cecum, extent intended: Cecum.  The cecum was identified by appendiceal orifice and IC valve. Patient position: on left side. Colon retroflexion performed. Images taken. ASA Classification: II. Tolerance: excellent.  Monitoring: Pulse and BP monitoring, Oximetry used. Supplemental O2 given.  Colon Prep Used Miralax for colon prep. Prep results: excellent.  Sedation Meds: Patient assessed and found to be appropriate for moderate (conscious) sedation. Fentanyl 100 mcg. given IV. Versed 8 mg. given IV.  Findings NORMAL EXAM: Cecum to Rectum. Comments: small internal hemorrhoids.    Comments: NO POLYPS SEEN Assessment Normal examination.  Events  Unplanned Interventions: No intervention was required.  Unplanned Events: There were no complications. Plans Disposition: After procedure patient sent to recovery. After recovery patient sent home.  Comments: RETURN TO THE CARE OF DR.NORINS  cc: Rosalyn Gess. Norins, MD  This report was created from the original endoscopy report, which was reviewed and signed by the above listed endoscopist.

## 2010-02-20 NOTE — Assessment & Plan Note (Signed)
Summary: PULM CONST- PNEUMONIA //kp   Visit Type:  Follow-up Copy to:  Dr Valma Cava ER Primary Provider/Referring Provider:  Dr Antoine Poche - cardiology., Dr. Shelle Iron - sleep  CC:  Pt here for hfu for PNA. Pt state sshe feels better. Pt still has productive cough. .  History of Present Illness: IOV 03/01/2009. 50 year old obese, non-smoking female, with family hx of asthma, and with prior hx of atypical chest pains and very minimal CAD. Went to [facility] ER on 02/25/2009 with sudden onset of cough with sputum for 2 days. Cough associated with green yellow sputum at onse and wheeze and chills. Cough made worse by lying down.  Also had associated rest chest pain in central area that started along with cough and made worse by cough. Did not have associated fever, hemoptysis, edema, weight loss, rash, nausea, vomit, and diarrhea. In ER had cXR and it showed RLL pneumonia (see results section). Was reportedly told she was wheezing on exam as well. Was given nebs and discharged with diagnosis of 'pneumonia' and 'acute bronchitis'. Was given 1 dose of IM antibioic and discharged on Zpak (today is day 5 of zpak). Today she is here for post ER followup. States she is a bit better overall in terms of cough and chest pain but only modestly. However, spoutum is more productive now and when she walks gets wheezy which is new. In fact, in office she could not complete her spirometry due to cough and in walking her she got wheezy.   CardioPerfect Spirometry  ID: [id number] Patient: [name], [name] DOB: 1961/01/01 Age: 50 Years Old Sex: Female Race: Black Physician: Minus Breeding MD Height: 63 Weight: 296.50 Status: Unconfirmed Past Medical History:  ROUTINE GENERAL MEDICAL [email or web address]  CARE FACL (ICD-V70.0) INSECT BITE, UPPER ARM (ICD-912.4) HYPOKALEMIA (ICD-276.8) NUMBNESS (ICD-782.0) CHF (ICD-428.0) (Preserved EF) UNS ADVRS EFF UNS RX MEDICINAL&BIOLOGICAL SBSTNC (ICD-995.20) OTITIS MEDIA, SUPPURATIVE,  ACUTE, BILATERAL (ICD-382.00) EXTERNAL OTITIS (ICD-380.10) SINUSITIS- ACUTE-NOS (ICD-461.9) OTITIS MEDIA, SEROUS, ACUTE (ICD-381.01) PLANTAR FASCIITIS (ICD-728.71) FAMILY HISTORY OF CAD FEMALE 1ST DEGREE RELATIVE <50 (ICD-V17.3) COMMON MIGRAINE (ICD-346.10) GOUT (ICD-274.9) MORBID OBESITY (ICD-278.01) DIABETES MELLITUS, TYPE II (ICD-250.00) HYPERLIPIDEMIA (ICD-272.4) STATUS, STERILIZATION, TUBAL LIGATION (ICD-V26.51) OBSTRUCTIVE SLEEP APNEA (ICD-327.23)- Dr. Shelle Iron, UPPP CARPAL TUNNEL SYNDROME (ICD-354.0) MYOCARDIAL INFARCTION, HX OF (ICD-412) HYPERTENSION (ICD-401.9) CORONARY ARTERY DISEASE (ICD-414.00) (Non obstructive) ASTHMA (ICD-493.90) ALLERGIC RHINITIS (ICD-477.9)- Recorded: 03/01/2009 3:48 PM  Parameter  Measured Predicted %Predicted FVC     1.52        2.78        54.70 FEV1     1.38        2.24        61.60 FEV1%   90.53        81.46        111.10 PEF    2.14        6.03        35.50   Interpretation:  Current Medications (verified): 1)  Catapres 0.2 Mg  Tabs (Clonidine Hcl) .... Two Times A Day 2)  Furosemide 40 Mg Tabs (Furosemide) .... One By Mouth Two Times A Day 3)  Potassium Chloride Crys Cr 20 Meq Cr-Tabs (Potassium Chloride Crys Cr) .... Take 5 Tablets Daily 4)  Hyzaar 50-12.5 Mg Tabs (Losartan Potassium-Hctz) .... One By Mouth Once Daily 5)  Azithromycin 250 Mg Tabs (Azithromycin) .... 2 Tablets First Day, One Tablet Daily Until Gone 6)  Proair Hfa 108 (90 Base) Mcg/act Aers (Albuterol Sulfate) .... As Needed  Allergies (verified):  1)  ! Sulfa 2)  ! Neomycin 3)  ! Cipro 4)  ! Vicodin 5)  ! * Cortisporn 6)  ! 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 ROUTINE GENERAL MEDICAL EXAM@[email or web address]  CARE FACL (ICD-V70.0) INSECT BITE, UPPER ARM (ICD-912.4) HYPOKALEMIA (ICD-276.8) NUMBNESS (ICD-782.0) CHF (ICD-428.0) (Preserved EF) UNS ADVRS EFF UNS RX MEDICINAL&BIOLOGICAL SBSTNC (ICD-995.20) OTITIS MEDIA, SUPPURATIVE, ACUTE, BILATERAL (ICD-382.00) EXTERNAL OTITIS (ICD-380.10) SINUSITIS- ACUTE-NOS (ICD-461.9) OTITIS MEDIA, SEROUS, ACUTE (ICD-381.01) PLANTAR FASCIITIS (ICD-728.71) FAMILY HISTORY OF CAD FEMALE 1ST DEGREE RELATIVE <50 (ICD-V17.3) COMMON MIGRAINE (ICD-346.10) GOUT (ICD-274.9) MORBID OBESITY (ICD-278.01) DIABETES MELLITUS, TYPE II (ICD-250.00) HYPERLIPIDEMIA (ICD-272.4) STATUS, STERILIZATION, TUBAL LIGATION (ICD-V26.51) OBSTRUCTIVE SLEEP APNEA (ICD-327.23)- Dr. Shelle Iron, UPPP CARPAL TUNNEL SYNDROME (ICD-354.0) MYOCARDIAL INFARCTION, HX OF (ICD-412) HYPERTENSION (ICD-401.9) CORONARY ARTERY DISEASE (ICD-414.00) (Non obstructive) ASTHMA (ICD-493.90) ALLERGIC RHINITIS (ICD-477.9)-  Past Surgical History: Reviewed history from 09/13/2008 and no changes required. Tubal ligation Tonsillectomy/ UPPP Hysterectomy Caesarean section Sinus surgery  Family History: Reviewed history from 09/13/2008 and no changes required. Family History of CAD Female 1st degree relative <50 Aunt- breast cancer Mother- heart disease Allergies Asthma- sister  Social History: Reviewed history from 09/13/2008 and no changes required. 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  The patient denies  non-productive cough, coughing up blood, irregular heartbeats, acid heartburn, indigestion, loss of appetite, abdominal pain, difficulty swallowing, sore throat, tooth/dental problems, nasal congestion/difficulty breathing through nose, itching, ear ache, anxiety, depression, joint stiffness or pain, rash, and fever.    Vital Signs:  Patient profile:   50 year old female Height:      63 inches Weight:      296.50 pounds O2 Sat:      93 % on Room air Temp:     99.5 degrees F oral Pulse rate:   94 / minute BP sitting:   140 / 82  (right arm) Cuff size:   large  Vitals Entered By: Carron Curie CMA (March 01, 2009 3:17 PM)  O2 Flow:  Room air CC: Pt here for hfu for PNA. Pt state sshe feels better. Pt still has productive cough.  Comments Medications reviewed with patient Carron Curie CMA  March 01, 2009 3:20 PM Daytime phone number verified with patient.    Physical Exam  General:  morbidly obese female. COughs periodically Head:  normocephalic and atraumatic Ears:  rt ear canal is red but both drums are normal Nose:  no skin breakdown or pressure necrosis from the cpap mask Mouth:  no deformity or lesionsMelampatti Class III and Melampatti Class IV.   Neck:  no masses, thyromegaly, or abnormal cervical nodes. Short neck Chest Wall:  no deformities noted Lungs:  decreased BS bilateral and rales R base.   Heart:  regular rate and rhythm, S1, S2 without murmurs, rubs, gallops, or clicks Abdomen:  bowel sounds positive; abdomen soft and non-tender without  masses, or organomegaly Msk:  no deformity or scoliosis noted with normal posture Pulses:  pulses normal Extremities:  no clubbing, cyanosis, edema, or deformity noted Neurologic:  CN II-XII grossly intact with normal reflexes, coordination, muscle strength and tone Skin:  intact without lesions or rashes Cervical Nodes:  no significant adenopathy Psych:  alert and cooperative; normal mood and affect; normal  attention span and concentration   CXR  Procedure date:  02/25/2009  Findings:       Comparison: 03/03/2007    Findings: Heart size is at the upper limits of normal.  The aorta   is unfolded.  There is bronchial thickening consistent with   bronchitis.  I think there is patchy density in the lower lobes,   right more than left, consistent with mild pneumonia.  No dense   consolidation.  No effusions.  Bony structures are unremarkable.    IMPRESSION:   Bronchitis and patchy lower lobe pneumonia, right more than left.    Read By:  Thomasenia Sales,  M.D.   Released By:  Thomasenia Sales,  M.D.  Comments:      reviewed and agree  MISC. Report  Procedure date:  09/22/2008  Findings:      Findings  Result Name                              Result     Abnl   Normal Range     Units      Perf. Loc.  Sodium (NA)                              138               135-145          mEq/L  Potassium (K)                            3.2        l      3.5-5.1          mEq/L  Chloride                                 101               96-112           mEq/L  CO2                                      29                19-32            mEq/L  Glucose                                  103        h      70-99            mg/dL  BUN  14                6-23             mg/dL  Creatinine                               0.6               0.4-1.2          mg/dL  Calcium                                  8.9               8.4-10.5         mg/dL  GFR - [facility]                        136.18            >60              mL/min  Pulmonary Function Test Date: 03/01/2009 3:48 PM Gender: Female  Pre-Spirometry FVC    Value: 1.52 L/min   % Pred: 54.70 % FEV1    Value: 1.38 L     Pred: 2.24 L     % Pred: 61.60 % FEV1/FVC  Value: 90.53 %     % Pred: 111.10 %  Impression & Recommendations:  Problem # 1:  PNEUMONIA, ORGAN UNSPECIFIED (ICD-486) Assessment New  SHe is s/p RLL  pneumonia from 02/25/2009. However, clinically she is not much better with zpak and is wheezing on exertion and still with green sputum. She could not coopertae with spirometry for me to test if she would need steroids but she wheezed when we walked her in office. Bsae on this I will chnage her antibiotic to 6days of avelox and do prednisone burst (1 dose depot medrol now and then 8d prednisone from tomorow). IF she gets worse, then she will come in or go to er or call us. ROV in 10 days. AT follwoup need to esgtabish if she has asthma or not.  Her updated medication list for this problem includes:    Azithromycin 250 Mg Tabs (Azithromycin) .[name]... 2 tablets first day, one tablet daily until gone    Avelox 400 Mg Tabs (Moxifloxacin hcl) ..... By mouth daily  Orders: Est. Patient Level IV (32355)  Medications Added to Medication List This Visit: 1)  Azithromycin 250 Mg Tabs (Azithromycin) .... 2 tablets first day, one tablet daily until gone 2)  Proair Hfa 108 (90 Base) Mcg/act Aers (Albuterol sulfate) .... As needed 3)  Prednisone 10 Mg Tabs (Prednisone) .... 4 tabs daily x2 days, then 3 tablets daily x 2 days, then 2 tablets daily x2 days, then 1 tablet daily x2 days, then stop 4)  Avelox 400 Mg Tabs (Moxifloxacin hcl) .... By mouth daily  Patient Instructions: 1)  WE will give you 1 shot depot medrol 80mg  IM today 2)  Tomorow start prednisone taper for another 8 days 3)  Stop Zpak 4)  STart Avelox from today one daily for 6 days - take sample 5)  Return to see NP [name] or Dr Shelle Iron in 10 days 6)  Come sooner or call or go to ER if worse Prescriptions: AVELOX 400 MG  TABS (MOXIFLOXACIN HCL) By mouth daily  #  6 x 0   Entered and Authorized by:   Kalman Shan MD   Signed by:   Kalman Shan MD on 03/01/2009   Method used:   Electronically to        [facility] #[id number]* (retail)       [address] [place], [place]  [address]       Ph: [phone]       Fax: [phone]    RxID:   [id number] PREDNISONE 10 MG  TABS (PREDNISONE) 4 tabs daily x2 days, then 3 tablets daily x 2 days, then 2 tablets daily x2 days, then 1 tablet daily x2 days, then stop  #20 x 0   Entered and Authorized by:   Kalman Shan MD   Signed by:   Kalman Shan MD on 03/01/2009   Method used:   Electronically to        [facility] #[id number]* (retail)       [address] [place], [place]  [address]       Ph: [phone]       Fax: [phone]   RxID:   [id number]     Appended Document: PULM CONST- PNEUMONIA //kp Ambulatory Pulse Oximetry  Resting; HR__90___    02 Sat___97__  Lap1 (185 feet)   HR_108____   02 Sat___92__ Lap2 (185 feet)   HR__108___   02 Sat__92___    Lap3 (185 feet)   HR_____   02 Sat_____  ___Test Completed without Difficulty __x_Test Stopped due RW:[id number] to stop due to sob, wheezing and fatigue    Appended Document: depo injection     Clinical Lists Changes  Orders: Added new Service order of Depo- Medrol 80mg  (J1040) - Signed Added new Service order of Admin of Therapeutic Inj  intramuscular or subcutaneous (19509) - Signed       Medication Administration  Injection # 1:    Medication: Depo- Medrol 80mg     Diagnosis: PNEUMONIA, ORGAN UNSPECIFIED (ICD-486)    Route: IM    Site: RUOQ gluteus    Exp Date: 10/11    Lot #: [id number]    Mfr: teva    Patient tolerated injection without complications    Given by: Carron Curie CMA (March 01, 2009 5:24 PM)  Orders Added: 1)  Depo- Medrol 80mg  [J1040] 2)  Admin of Therapeutic Inj  intramuscular or subcutaneous [80998]

## 2010-02-20 NOTE — Progress Notes (Signed)
Summary: needs f/u w/ kc  Phone Note Call from Patient Call back at Home Phone [phone]   Caller: Patient Call For: clance Summary of Call: pt states that she needs to be seen for f/u. pt was at [facility] er this weekend diagnosed with pna. was told to f/u w/ her dr that she sees for sleep apnea. i'm not sure if this should be a new consult w/ kc or what. please advise and i will make this appt for pt.  Initial call taken by: e  Follow-up for Phone Call        this is already kc's pt for sleep so ok to schedule pt to see him for sleep.  Philipp Deputy [facility]  February 27, 2009 2:45 PM   Nicholos Johns is talking to pt now pt states she needs to be seen for pulmonary so i explained to [place] if it is for pulmonary it will be a new consult for 1st available doc. Follow-up by: Philipp Deputy CMA,  February 27, 2009 3:28 PM

## 2010-02-20 NOTE — Procedures (Signed)
Summary: Colonoscopy/[facility]  Colonoscopy/[facility]   Imported By: Sherian Rein 11/07/2009 11:32:55  _____________________________________________________________________  External Attachment:    Type:   Image     Comment:   External Document

## 2010-02-20 NOTE — Assessment & Plan Note (Signed)
Summary: abd pain f/u ER visit/pl                   perry pt   History of Present Illness Visit Type: Follow-up Visit Primary GI MD: Yancey Flemings MD Primary Provider: Deboraha Sprang [facility] Complaint: LUQ sharp pain that started last Thursday History of Present Illness:   PLEASANT 50 YO FEMALE KNOWN TO DR. PERRY FROM COLONOSCOPY IN 2005 WHICH WAS NORMAL.SHE HAS ALSO HAD AN EGD IN 1998-NORMAL. SHE COMES IN TODAY AFTER AN ER VISIT LAST WEEK WITH ABRUPT ONSET OF LUQ PAIN ON 11/02/09. SHE HAD LABS WHICH WERE UNREMARKABLE AND A CT ABD/PELVIS-ALSO NEGATIVE. SHE WAS GIVEN TYLENOL #3 WHICH HELPS BUT THE PAIN PERSISTS. SHE C/O INCREASED PAIN WITH MOVEMENT, UNCOMFORATBLE SLEEPING, WORSE WITH LAUGHING, COUGHING, ETC. HAS NOT NOTICED ANY CHANGE WITH by mouth INTAKE. NO N/V, APPETITE FINE. HER STOOLS HAVE BEEN A BIT  LOOSER, NO MELENA OR HEME. SHE RELATES A SIMILAR EPISODE IN JAN 2011- HOSPITALIZED IN [place] WITH PAIN AND WAS TOLD SHE HAD DIVERTICULITIS.RECORDS NOT AVAILABLE. SHE HAS NOT HAD ANY RECENT FALL OR INJURY BUT REMEMBERS REACHING FOR A CHILD AT HER SCHOOL TRYING TO PREVENT HIM FROM RUNNING IN TO SOMETHING AND WONDERS IF SHE PULLED SOMETHING THEN.   GI Review of Systems    Reports abdominal pain and  bloating.     Location of  Abdominal pain: LUQ.    Denies acid reflux, belching, chest pain, dysphagia with liquids, dysphagia with solids, heartburn, loss of appetite, nausea, vomiting, vomiting blood, weight loss, and  weight gain.      Reports diarrhea and  diverticulosis.     Denies anal fissure, black tarry stools, change in bowel habit, constipation, fecal incontinence, heme positive stool, hemorrhoids, irritable bowel syndrome, jaundice, light color stool, liver problems, rectal bleeding, and  rectal pain.   Current Medications (verified): 1)  Catapres 0.2 Mg  Tabs (Clonidine Hcl) .... Two Times A Day 2)  Furosemide 40 Mg Tabs (Furosemide) .... One By Mouth Once Daily 3)   Potassium Chloride Crys Cr 20 Meq Cr-Tabs (Potassium Chloride Crys Cr) .... Take 2 Tablets Daily 4)  Hyzaar 50-12.5 Mg Tabs (Losartan Potassium-Hctz) .... One By Mouth Once Daily 5)  Tylenol With Codeine #3 300-30 Mg Tabs (Acetaminophen-Codeine) .... As Needed For Pain  Allergies: 1)  ! Sulfa 2)  ! Neomycin 3)  ! Cipro 4)  ! * Cortisporn 5)  ! * Hydrocortisone 6)  Doxycycline  Past History:  Past Medical History: ROUTINE GENERAL MEDICAL EXAM@[email or web address]  CARE FACL (ICD-V70.0) INSECT BITE, UPPER ARM (ICD-912.4) HYPOKALEMIA (ICD-276.8) CHF (ICD-428.0) (Preserved EF) UNS ADVRS EFF UNS RX MEDICINAL&BIOLOGICAL SBSTNC (ICD-995.20) OTITIS MEDIA, SUPPURATIVE, ACUTE, BILATERAL (ICD-382.00) EXTERNAL OTITIS (ICD-380.10) SINUSITIS- ACUTE-NOS (ICD-461.9) OTITIS MEDIA, SEROUS, ACUTE (ICD-381.01) PLANTAR FASCIITIS (ICD-728.71) FAMILY HISTORY OF CAD FEMALE 1ST DEGREE RELATIVE <50 (ICD-V17.3) COMMON MIGRAINE (ICD-346.10) GOUT (ICD-274.9) MORBID OBESITY (ICD-278.01) DIABETES MELLITUS, TYPE II (ICD-250.00) HYPERLIPIDEMIA (ICD-272.4) STATUS, STERILIZATION, TUBAL LIGATION (ICD-V26.51) OBSTRUCTIVE SLEEP APNEA (ICD-327.23)- Dr. Shelle Iron, UPPP CARPAL TUNNEL SYNDROME (ICD-354.0) MYOCARDIAL INFARCTION, HX OF (ICD-412) HYPERTENSION (ICD-401.9) CORONARY ARTERY DISEASE (ICD-414.00) (Non obstructive) ASTHMA (ICD-493.90) ALLERGIC RHINITIS (ICD-477.9)-  Past Surgical History: Tubal ligation Tonsillectomy/ UPPP Hysterectomy partial Caesarean section Sinus surgery  Family History: Reviewed history from 09/13/2008 and no changes required. Family History of CAD Female 1st degree relative <50 Aunt- breast cancer Mother- heart disease Allergies Asthma- sister  Social History: Reviewed history from 09/13/2008 and no changes required. 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  The patient denies anemia, anxiety-new, arthritis/joint pain, back pain, blood in urine, breast changes/lumps, change in vision, confusion, cough, coughing up blood, depression-new, fainting, fatigue, fever, headaches-new, hearing problems, heart murmur, heart rhythm changes, itching, menstrual pain, muscle pains/cramps, night sweats, nosebleeds, pregnancy symptoms, skin rash, sore throat, swelling of feet/legs, swollen lymph glands, thirst - excessive , urination - excessive , urination changes/pain, urine leakage, vision changes, and voice change.         SEE HPI  Vital Signs:  Patient profile:   50 year old female Height:      63 inches Weight:      300.50 pounds BMI:     53.42 Pulse rate:   84 / minute Pulse rhythm:   regular BP sitting:   168 / 96  (left arm) Cuff size:   large  Vitals Entered By: June McMurray CMA Duncan Dull) (November 06, 2009 3:15 PM)  Physical Exam  General:  Well developed, well nourished, no acute distress. Head:  Normocephalic and atraumatic. Eyes:  PERRLA, no icterus. Lungs:  Clear throughout to auscultation. Heart:  Regular rate and rhythm; no murmurs, rubs,  or bruits. Abdomen:  OBESE, PROTUBERANT, NONTENDER, NO MASS OR HSM,BS+ SHE IS EXQUISITELY TENDER OVER THE LOWER RIBS ON THE LEFT, NO RASH Rectal:  NOT DONE Msk:  EXQUISITE TENDERNESS ALONG LEFT LOWER RIBS ANTERIORLY AND INTO FLANK. MOVEMENT  SLOW  Neurologic:  Alert and  oriented x4;  grossly normal neurologically. Psych:  Alert and cooperative. Normal mood and affect.  Impression & Recommendations:  Problem # 1:  ABDOMINAL PAIN-LUQ (ICD-789.02) Assessment New 50 YO FEMALE WITH 5-6 DAY HX OF INTENSE LUQ PAIN WITH NEGATIVE CT ABD/PELVIS AND UNREMARKABLE LABS 11/02/09. HER PAIN IS MUSCULOSKELETAL- R/O STRAIN, COSTOCHONDRITIS-POSSIBLE NON-ERUPTED SHINGLES. REFILL TYLENOL #3 PREDNISONE DOSE PAK X 6 DAYS MOIST HEAT/REST PT TO CALL BACK IN 7-10 DAYS IF NOT  SIGNIFICANTLY BETTER, OR IF DEVELOPS  RASH. FOLLOW UP WITH PCP IF THE PROBLEM IS NOT GI RELATED.  Patient Instructions: 1)  We will fax the Tylenol 3 prescription to [facility] 2)  We phoned in a prescription also for  Sterapred dosepak. 3)  Use Moist heat in the right upper abdominal area. 4)  Call us in 1 week if symtoms worsen. 5)  Copy sent to : [facility]. 6)  The medication list was reviewed and reconciled.  All changed / newly prescribed medications were explained.  A complete medication list was provided to the patient / caregiver.  Prescriptions: STERAPRED DOSEPAK, 10 MG, Take as directed for 6 days  # 1 dosepak x 0   Entered by:   Lowry Ram NCMA   Authorized by:   Sammuel Cooper PA-c   Signed by:   Sammuel Cooper PA-c on 11/06/2009   Method used:   Telephoned to ...       [facility] #[id number]* (retail)       [address] [place], [place]  [address]       Ph: [phone]       Fax: [phone]   RxID:   [id number] TYLENOL WITH CODEINE #3 300-30 MG TABS (ACETAMINOPHEN-CODEINE) as needed for pain  #30 x 0   Entered by:   Lowry Ram NCMA   Authorized  by:   Sammuel Cooper PA-c   Signed by:   Lowry Ram NCMA on 11/06/2009   Method used:   Printed then faxed to ...       [facility] #[id number]* (retail)       [address] [place], [place]  [address]       Ph: [phone]       Fax: [phone]   RxID:   [id number]

## 2010-02-20 NOTE — Progress Notes (Signed)
Summary: med concern   Phone Note Call from Patient Call back at [facility] Phone [phone]   Caller: Patient Call For: Dr. Marina Goodell Reason for Call: Talk to Nurse Summary of Call: Prednisone not helping Initial call taken by: Vallarie Mare,  November 09, 2009 9:18 AM  Follow-up for Phone Call        Pt continues to have sharp pains on the left side.  The pain becomes sharp with movement but is constant.  She is having normal bowel movements and is not running a fever, no nausea or vomiting.  The pain is no worse just no better.  She says the Tylenol #3 is not helping only makes her sleep.  Follow-up by: Chales Abrahams CMA Duncan Dull),  November 09, 2009 9:27 AM  Additional Follow-up for Phone Call Additional follow up Details #1::        HER PAIN IS MUSCULOSKELETAL,NOT GI. WOULD FINISH THE PREDNISONE DOSE PAK. SHE CAN TAKKE AN ANTIINFLAMMATORY LIKE ALEVE TWICE DAILY WITH FOOD. THIS IS LIKELY GOING TO BE A FEW WEEKS RESOLVING.SHE SHOULD SEE HER PRIMARY MD FOR FURTHER ADVICE. Additional Follow-up by: Peterson Ao,  November 09, 2009 10:06 AM    Additional Follow-up for Phone Call Additional follow up Details #2::    Pt given the above orders from Amy and she verbalized understanding and will call the PCP. Follow-up by: Chales Abrahams CMA Duncan Dull),  November 09, 2009 10:11 AM

## 2010-02-20 NOTE — Assessment & Plan Note (Signed)
Summary: rov. gd  Medications Added FUROSEMIDE 40 MG TABS (FUROSEMIDE) one by mouth once daily        Visit Type:  Follow-up Primary Provider:  [facility]  CC:  chest pain.  History of Present Illness: The patient presents for followup of chest pain. She was seen in the emergency room on February 5 for this. However, she was thought to have pneumonia and bronchitis. There was no evidence of cardiac involvement. She has since been treated for this. She says that she was hospitalized in [place] since I last saw her. She reports an allergic reaction to mushrooms. She also reports that her potassium was 2.0 necessitating hospitalization and IV potassium.  Since being seen in the emergency room in February she's had no acute complaints. She's had no presyncope or syncope. She's had no new shortness of breath, PND or orthopnea. She's had no chest discomfort, neck or   Problems Prior to Update: 1)  Pneumonia, Organ Unspecified  (ICD-486) 2)  Leg Pain, Bilateral  (ICD-729.5) 3)  Otitis Media, Suppurative, Acute  (ICD-382.00) 4)  Sinusitis- Acute-nos  (ICD-461.9) 5)  Routine General Medical Exam@health  Care Facl  (ICD-V70.0) 6)  Insect Bite, Upper Arm  (ICD-912.4) 7)  Hypokalemia  (ICD-276.8) 8)  Numbness  (ICD-782.0) 9)  CHF  (ICD-428.0) 10)  Uns Advrs Eff Uns Rx Medicinal&biological Sbstnc  (ICD-995.20) 11)  Otitis Media, Suppurative, Acute, Bilateral  (ICD-382.00) 12)  External Otitis  (ICD-380.10) 13)  Sinusitis- Acute-nos  (ICD-461.9) 14)  Otitis Media, Serous, Acute  (ICD-381.01) 15)  Plantar Fasciitis  (ICD-728.71) 16)  Family History of Cad Female 1st Degree Relative <50  (ICD-V17.3) 17)  Common Migraine  (ICD-346.10) 18)  Gout  (ICD-274.9) 19)  Morbid Obesity  (ICD-278.01) 20)  Diabetes Mellitus, Type II  (ICD-250.00) 21)  Hyperlipidemia  (ICD-272.4) 22)  Status, Sterilization, Tubal Ligation  (ICD-V26.51) 23)  Obstructive Sleep Apnea  (ICD-327.23) 24)   Carpal Tunnel Syndrome  (ICD-354.0) 25)  Myocardial Infarction, Hx of  (ICD-412) 26)  Hypertension  (ICD-401.9) 27)  Coronary Artery Disease  (ICD-414.00) 28)  Asthma  (ICD-493.90) 29)  Allergic Rhinitis  (ICD-477.9)  Current Medications (verified): 1)  Catapres 0.2 Mg  Tabs (Clonidine Hcl) .... Two Times A Day 2)  Furosemide 40 Mg Tabs (Furosemide) .... One By Mouth Once Daily 3)  Potassium Chloride Crys Cr 20 Meq Cr-Tabs (Potassium Chloride Crys Cr) .... Take 2 Tablets Daily 4)  Hyzaar 50-12.5 Mg Tabs (Losartan Potassium-Hctz) .... One By Mouth Once Daily  Allergies: 1)  ! Sulfa 2)  ! Neomycin 3)  ! Cipro 4)  ! Vicodin 5)  ! * Cortisporn 6)  ! * Hydrocortisone 7)  Doxycycline  Past History:  Past Medical History: Reviewed history from 09/14/2008 and no changes required. ROUTINE GENERAL MEDICAL EXAM@[email or web address]  CARE FACL (ICD-V70.0) INSECT BITE, UPPER ARM (ICD-912.4) HYPOKALEMIA (ICD-276.8) NUMBNESS (ICD-782.0) CHF (ICD-428.0) (Preserved EF) UNS ADVRS EFF UNS RX MEDICINAL&BIOLOGICAL SBSTNC (ICD-995.20) OTITIS MEDIA, SUPPURATIVE, ACUTE, BILATERAL (ICD-382.00) EXTERNAL OTITIS (ICD-380.10) SINUSITIS- ACUTE-NOS (ICD-461.9) OTITIS MEDIA, SEROUS, ACUTE (ICD-381.01) PLANTAR FASCIITIS (ICD-728.71) FAMILY HISTORY OF CAD FEMALE 1ST DEGREE RELATIVE <50 (ICD-V17.3) COMMON MIGRAINE (ICD-346.10) GOUT (ICD-274.9) MORBID OBESITY (ICD-278.01) DIABETES MELLITUS, TYPE II (ICD-250.00) HYPERLIPIDEMIA (ICD-272.4) STATUS, STERILIZATION, TUBAL LIGATION (ICD-V26.51) OBSTRUCTIVE SLEEP APNEA (ICD-327.23)- Dr. Shelle Iron, UPPP CARPAL TUNNEL SYNDROME (ICD-354.0) MYOCARDIAL INFARCTION, HX OF (ICD-412) HYPERTENSION (ICD-401.9) CORONARY ARTERY DISEASE (ICD-414.00) (Non obstructive) ASTHMA (ICD-493.90) ALLERGIC RHINITIS (ICD-477.9)-  Past Surgical History: Reviewed history from 09/13/2008 and no changes required. Tubal ligation  Tonsillectomy/ UPPP Hysterectomy Caesarean section Sinus surgery  Review  of Systems       As stated in the HPI and negative for all other systems.   Vital Signs:  Patient profile:   50 year old female Height:      63 inches Weight:      289 pounds BMI:     51.38 Pulse rate:   69 / minute BP sitting:   138 / 79  (left arm) Cuff size:   large  Vitals Entered By: Oswald Hillock (April 10, 2009 4:33 PM)  Physical Exam  General:  Well developed, well nourished, in no acute distress. Head:  normocephalic and atraumatic Eyes:  PERRLA/EOM intact; conjunctiva and lids normal. Neck:  Neck supple, no JVD. No masses, thyromegaly or abnormal cervical nodes. Chest Wall:  no deformities noted Lungs:  Clear bilaterally to auscultation and percussion. Heart:  Non-displaced PMI, chest non-tender; regular rate and rhythm, S1, S2 without murmurs, rubs or gallops. Carotid upstroke normal, no bruit. Normal abdominal aortic size, no bruits. Femorals normal pulses, no bruits. Pedals normal pulses. No edema, no varicosities. Abdomen:  Bowel sounds positive; abdomen soft and non-tender without masses, organomegaly, or hernias noted. No hepatosplenomegaly, morbidly obese Msk:  Back normal, normal gait. Muscle strength and tone normal. Extremities:  No clubbing or cyanosis. Neurologic:  Alert and oriented x 3. Skin:  Intact without lesions or rashes. Psych:  Normal affect.   EKG  Procedure date:  04/10/2009  Findings:      sinus rhythm, rate 69, axis within normal limits, intervals within normal limits, no acute ST-T wave changes.  Impression & Recommendations:  Problem # 1:  CHF (ICD-428.0) The patient has had CHF with a well-preserved ejection fraction. At this point no further cardiovascular testing is suggested. She will continue the meds as listed. Orders: TLB-BMP (Basic Metabolic Panel-BMET) (80048-METABOL)  Problem # 2:  HYPERTENSION (ICD-401.9) Her hypertension seems to be well treated. She will remain on the meds as listed. I will draw basic metabolic  profile today given her significant problems with hypokalemia.  Problem # 3:  MORBID OBESITY (ICD-278.01) She understands the need to lose weight with diet and exercise.  Other Orders: EKG w/ Interpretation (93000)  Patient Instructions: 1)  Your physician recommends that you schedule a follow-up appointment in: 12 months with Dr Antoine Poche 2)  Your physician recommends that you continue on your current medications as directed. Please refer to the Current Medication list given to you today.

## 2010-02-20 NOTE — Progress Notes (Signed)
Summary: Medication   Phone Note From Pharmacy   Caller: [facility] [phone] Call For: Mike Gip  Summary of Call: Calling about directions for Tylenol#3 Initial call taken by: Karna Christmas,  November 07, 2009 12:33 PM  Follow-up for Phone Call        I spoke to the pharmacist and advised him the the PA Zalika Tieszen wants to prescribe the Tylenol 3,take 1 tab by mouth every 4-6 hours as needed for pain.  Follow-up by: Joselyn Glassman,  November 07, 2009 1:20 PM

## 2010-02-20 NOTE — Progress Notes (Signed)
Summary: ? about medication-LMTCB x 1  Phone Note Call from Patient   Caller: Patient Call For: ramaswamy Summary of Call: pt want to no if dr Marchelle Gearing want her to take both antibiotics Initial call taken by: Rickard Patience,  March 02, 2009 8:50 AM  Follow-up for Phone Call        Looks like MR wanted pt to d/c zpack and he gave her avelox. Then, avelox was changed to doxycycline due to potentail interaction.  So, she needs to just be taking doxycycline. LMOMTCB Vernie Murders  March 02, 2009 10:30 AM   Spoke with pt and made aware of the above.  Pt will take the doxy only.  Follow-up by: Vernie Murders,  March 02, 2009 10:39 AM

## 2010-02-20 NOTE — Procedures (Signed)
Summary: Colonoscopy/[place]  Colonoscopy/   Imported By: Sherian Rein 11/07/2009 11:31:18  _____________________________________________________________________  External Attachment:    Type:   Image     Comment:   External Document

## 2010-02-22 NOTE — Progress Notes (Signed)
Summary: why she was told she had enlarge heart   Phone Note Call from Patient Call back at Home Phone [phone]   Caller: Patient Reason for Call: Talk to Nurse Summary of Call: pt went to [facility] this am . was told by md that pt has enlarge heart wondering why she was never told.  Initial call taken by: Lorne Skeens,  January 02, 2010 12:01 PM  Follow-up for Phone Call        Spoke with pt and explaied to her what the term "enlarged heart"comes from and that we have known for some time of the cardiomyopathy. Reviewed with her that she may recall being told she has a congestive heart failure or cardiomyothy/cardiomegaly which she does remember.  Review diagonisis - she denies any other questions and is aware to follow up in 03/2010 Follow-up by: Charolotte Capuchin, RN,  January 02, 2010 2:26 PM

## 2010-02-26 ENCOUNTER — Ambulatory Visit (INDEPENDENT_AMBULATORY_CARE_PROVIDER_SITE_OTHER): Payer: No Typology Code available for payment source | Admitting: Pulmonary Disease

## 2010-02-26 ENCOUNTER — Encounter: Payer: Self-pay | Admitting: Pulmonary Disease

## 2010-02-26 ENCOUNTER — Other Ambulatory Visit: Payer: Self-pay | Admitting: Pulmonary Disease

## 2010-02-26 DIAGNOSIS — J019 Acute sinusitis, unspecified: Secondary | ICD-10-CM

## 2010-02-26 DIAGNOSIS — J209 Acute bronchitis, unspecified: Secondary | ICD-10-CM | POA: Insufficient documentation

## 2010-02-27 ENCOUNTER — Ambulatory Visit (INDEPENDENT_AMBULATORY_CARE_PROVIDER_SITE_OTHER)
Admission: RE | Admit: 2010-02-27 | Discharge: 2010-02-27 | Disposition: A | Discharge status: Home / Self Care | Payer: No Typology Code available for payment source | Source: Ambulatory Visit | Attending: Pulmonary Disease | Admitting: Pulmonary Disease

## 2010-02-27 DIAGNOSIS — J019 Acute sinusitis, unspecified: Secondary | ICD-10-CM

## 2010-02-28 ENCOUNTER — Telehealth (INDEPENDENT_AMBULATORY_CARE_PROVIDER_SITE_OTHER): Payer: Self-pay | Admitting: *Deleted

## 2010-03-01 ENCOUNTER — Telehealth (INDEPENDENT_AMBULATORY_CARE_PROVIDER_SITE_OTHER): Payer: Self-pay | Admitting: *Deleted

## 2010-03-08 NOTE — Progress Notes (Signed)
Summary: pain med  LMTCBX1  Phone Note Call from Patient Call back at Home Phone [phone] P PH     Caller: Patient Call For: Clance Summary of Call: Pt would like something called in for pain.  pt currently has a sinus infection and c/o bad headaches and facial pressure.  pt has tried tylenol and advil with no relief of symptoms.  KC, please advise if we can call something in for her.  thanks.  Aundra Millet Reynolds LPN  February 28, 2010 9:34 AM  Initial call taken by: Arman Filter LPN,  February 28, 2010 9:34 AM  Follow-up for Phone Call        can try afrin 2 sprays each nostril am and pm for 3 DAYS ONLY.  THEN STOP. ok to call in vicodan 5mg   one every 6 hrs if needed for pain # 10, no fills. Follow-up by: Barbaraann Share MD,  February 28, 2010 10:16 AM  Additional Follow-up for Phone Call Additional follow up Details #1::        LMOMTCBX1.  Aundra Millet Reynolds LPN  February 28, 2010 10:19 AM     Additional Follow-up for Phone Call Additional follow up Details #2::    Spoke with pt and notified of the above recs per [facility].  Pt verbalized understanding.  Rx for vicodin called to pharm.  Follow-up by: Vernie Murders,  February 28, 2010 10:31 AM  New/Updated Medications: VICODIN 5-500 MG TABS (HYDROCODONE-ACETAMINOPHEN) 1 by mouth every 6 hrs as needed for pain Prescriptions: VICODIN 5-500 MG TABS (HYDROCODONE-ACETAMINOPHEN) 1 by mouth every 6 hrs as needed for pain  #10 x 0   Entered by:   Vernie Murders   Authorized by:   Barbaraann Share MD   Signed by:   Vernie Murders on 02/28/2010   Method used:   Telephoned to ...       [facility] [address] [phone]* (retail)       [address]       [place], [place]  [address]       Ph: [phone]       Fax: [phone]   RxID:   [id number]

## 2010-03-08 NOTE — Assessment & Plan Note (Signed)
Summary: acute sick visit for sinobronchitis   Copy to:  Dr Valma Cava ER Primary Provider/Referring Provider:  [facility] at [facility]  CC:  Sick visit.   Went to ER 2 weeks ago- had cxr and was dx with bronchitis- given steroid taper and albuterol hfa.   Pt c/o coughing up yellow to brown colored sputum and tightness in chest and "fluid in ears." .  History of Present Illness: the pt comes in today for an acute sick visit.  I follow her for osa, and have never seen her for pulmonary issues.  She has known chronic sinusitis, and has been followed by ENT in the past.  The question has been raised whether she may have asthma, but has never had testing.  Her medical record indicates multiple episodes of acute sinusitis, otitis, and bronchitis.  She comes in today for an acute sick visit with c/o sinus congestion, chest congestion, cough with purulent mucus.  She is unable to blow mucus from her nose, but feels very congested there.  She uses flonase regularly, as well as saline sinus rinses.  She has not had imaging of her sinuses or ENT f/u in quite some time.  She was recently in the ER approx 3 weeks ago where they gave her a prednisone pulse and albuterol inhaler for "asthma".  Her cxr at that time shows pulmonary venous htn, most c/w her chronic chf.  There was no pna seen.    Current Medications (verified): 1)  Catapres 0.2 Mg  Tabs (Clonidine Hcl) .... Two Times A Day 2)  Furosemide 40 Mg Tabs (Furosemide) .... One By Mouth Once Daily 3)  Potassium Chloride Crys Cr 20 Meq Cr-Tabs (Potassium Chloride Crys Cr) .... Take 2 Tablets Daily 4)  Hyzaar 50-12.5 Mg Tabs (Losartan Potassium-Hctz) .... One By Mouth Once Daily 5)  Ventolin Hfa 108 (90 Base) Mcg/act  Aers (Albuterol Sulfate) .[name].. 1-2 Puffs Every 4-6 Hours As Needed 6)  Flonase 50 Mcg/act  Susp (Fluticasone Propionate) .... Two Puffs Each Nostril Daily  Allergies (verified): 1)  ! Sulfa 2)  ! Neomycin 3)  ! Cipro 4)  ! *  Cortisporn 5)  ! * Hydrocortisone 6)  Doxycycline  Past History:  Past medical, surgical, family and social histories (including risk factors) reviewed, and no changes noted (except as noted below).  Past Medical History: Reviewed history from 11/06/2009 and no changes required. ROUTINE GENERAL MEDICAL EXAM@[email or web address]  CARE FACL (ICD-V70.0) INSECT BITE, UPPER ARM (ICD-912.4) HYPOKALEMIA (ICD-276.8) CHF (ICD-428.0) (Preserved EF) UNS ADVRS EFF UNS RX MEDICINAL&BIOLOGICAL SBSTNC (ICD-995.20) OTITIS MEDIA, SUPPURATIVE, ACUTE, BILATERAL (ICD-382.00) EXTERNAL OTITIS (ICD-380.10) SINUSITIS- ACUTE-NOS (ICD-461.9) OTITIS MEDIA, SEROUS, ACUTE (ICD-381.01) PLANTAR FASCIITIS (ICD-728.71) FAMILY HISTORY OF CAD FEMALE 1ST DEGREE RELATIVE <50 (ICD-V17.3) COMMON MIGRAINE (ICD-346.10) GOUT (ICD-274.9) MORBID OBESITY (ICD-278.01) DIABETES MELLITUS, TYPE II (ICD-250.00) HYPERLIPIDEMIA (ICD-272.4) STATUS, STERILIZATION, TUBAL LIGATION (ICD-V26.51) OBSTRUCTIVE SLEEP APNEA (ICD-327.23)- Dr. Shelle Iron, UPPP CARPAL TUNNEL SYNDROME (ICD-354.0) MYOCARDIAL INFARCTION, HX OF (ICD-412) HYPERTENSION (ICD-401.9) CORONARY ARTERY DISEASE (ICD-414.00) (Non obstructive) ASTHMA (ICD-493.90) ALLERGIC RHINITIS (ICD-477.9)-  Past Surgical History: Reviewed history from 11/06/2009 and no changes required. Tubal ligation Tonsillectomy/ UPPP Hysterectomy partial Caesarean section Sinus surgery  Family History: Reviewed history from 09/13/2008 and no changes required. Family History of CAD Female 1st degree relative <50 Aunt- breast cancer Mother- heart disease Allergies Asthma- sister  Social History: Reviewed history from 09/13/2008 and no changes required. 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  The patient denies shortness  of breath with activity, shortness of breath at rest, non-productive cough, coughing up blood, chest pain, irregular heartbeats, acid heartburn, indigestion, loss of appetite, weight change, abdominal pain, difficulty swallowing, sore throat, tooth/dental problems, nasal congestion/difficulty breathing through nose, sneezing, itching, anxiety, depression, hand/feet swelling, joint stiffness or pain, rash, and fever.    Vital Signs:  Patient profile:   50 year old female Height:      63 inches Weight:      302.25 pounds BMI:     53.73 O2 Sat:      95 % on Room air Temp:     97.8 degrees F oral Pulse rate:   96 / minute BP sitting:   160 / 94  (left arm) Cuff size:   large  Vitals Entered By: Arman Filter LPN (February 26, 2010 3:19 PM)  O2 Flow:  Room air CC: Sick visit.   Went to ER 2 weeks ago- had cxr and was dx with bronchitis- given steroid taper and albuterol hfa.   Pt c/o coughing up yellow to brown colored sputum and tightness in chest and "fluid in ears."  Comments Medications reviewed with patient Arman Filter LPN  February 26, 2010 3:21 PM    Physical Exam  General:  obese female in nad Nose:  deviated septum to left with mucosal edema and crusting Mouth:  clear, no exudates Lungs:  totally clear to auscultation Heart:  rrr, no mrg Extremities:  mild edema, no cyanosis  Neurologic:  alert, oriented, moves all 4.   Impression & Recommendations:  Problem # 1:  ACUTE BRONCHITIS (ICD-466.0) the pt is coughing up purulent mucus and has chest congestion, but this all started in her sinuses by her history.  Will treat with a course of abx for this.  She has no bronchospasm on exam, and has never really been tested for possible asthma.  Will arrange for f/u in 2 weeks with full pfts.    Problem # 2:  SINUSITIS- ACUTE-NOS (ICD-461.9) the pt has a history of chronic sinusitis, and currently has symptoms that suggest acute on chronic disease.  Will check ct sinuses, and if  abnormal, will need to go back to ENT.  I have asked her to continue with nasal ICS and sinus rinses.  Problem # 3:  OBSTRUCTIVE SLEEP APNEA (ICD-327.23) she is wearing cpap compliantly.  Stressed to her the need to work on weight reduction.  Medications Added to Medication List This Visit: 1)  Ventolin Hfa 108 (90 Base) Mcg/act Aers (Albuterol sulfate) .[name].. 1-2 puffs every 4-6 hours as needed 2)  Flonase 50 Mcg/act Susp (Fluticasone propionate) .... Two puffs each nostril daily 3)  Augmentin 875-125 Mg Tabs (Amoxicillin-pot clavulanate) .... By mouth twice daily  Other Orders: Est. Patient Level IV (57846) Pulmonary Referral (Pulmonary) Radiology Referral (Radiology)  Patient Instructions: 1)  will treat with augmentin 875mg  one am and pm on full stomach and large glass of water. 2)  continue on your sinus rinses and flonase  3)  will check a scan of your sinuses 4)  will see you back in 2 weeks with breathing tests to see if you have asthma.   Prescriptions: AUGMENTIN 875-125 MG  TABS (AMOXICILLIN-POT CLAVULANATE) By mouth twice daily  #20 x 0   Entered and Authorized by:   Barbaraann Share MD   Signed by:   Mellody Dance  Harle Stanford MD on 02/26/2010   Method used:   Print then Give to Patient   RxID:   [id number]

## 2010-03-13 ENCOUNTER — Ambulatory Visit: Payer: No Typology Code available for payment source | Admitting: Pulmonary Disease

## 2010-03-14 NOTE — Progress Notes (Signed)
Summary: Cough medicine > try robitussin dm  Phone Note Call from Patient Call back at Home Phone [phone]   Caller: Patient Call For: ramaswamy Summary of Call: Patient needs something prescribed for cough.  Pharmacy is [facility].  Please call patient to inform. Initial call taken by: Leonette Monarch,  March 01, 2010 3:09 PM  Follow-up for Phone Call        [facility] x1 Michel Bickers [facility]  March 01, 2010 3:54 PM  Called, spoke with pt.  She was seen by [facility] on 02/26/10 and was given augmentin.  She has started this but requesting something for cough.  States cough is worse at night.    [facility] [address] [facility] -- prefers no tesselon pearles bc they do not work  KC, pls advise.  Thanks! Follow-up by: Gweneth Dimitri RN,  March 02, 2010 5:27 PM  Additional Follow-up for Phone Call Additional follow up Details #1::        can take robitussin dm 10cc up to every 6 hrs if needed.  Cough should get better as her sinuses are treated.  If she continues to have a lot of sinus symptoms, will need to see ENT. Additional Follow-up by: Barbaraann Share MD,  March 05, 2010 4:37 PM    Additional Follow-up for Phone Call Additional follow up Details #2::    Called, spoke with pt.  She  was informed of above recs per [facility] and verbalized understanding. Follow-up by: Gweneth Dimitri RN,  March 05, 2010 4:47 PM

## 2010-03-27 ENCOUNTER — Emergency Department (HOSPITAL_COMMUNITY)
Admission: EM | Admit: 2010-03-27 | Discharge: 2010-03-27 | Disposition: A | Discharge status: Home / Self Care | Payer: No Typology Code available for payment source | Attending: Emergency Medicine | Admitting: Emergency Medicine

## 2010-03-27 ENCOUNTER — Ambulatory Visit: Payer: No Typology Code available for payment source | Admitting: Pulmonary Disease

## 2010-03-27 ENCOUNTER — Emergency Department (HOSPITAL_COMMUNITY): Payer: No Typology Code available for payment source

## 2010-03-27 DIAGNOSIS — R059 Cough, unspecified: Secondary | ICD-10-CM | POA: Insufficient documentation

## 2010-03-27 DIAGNOSIS — I10 Essential (primary) hypertension: Secondary | ICD-10-CM | POA: Insufficient documentation

## 2010-03-27 DIAGNOSIS — J40 Bronchitis, not specified as acute or chronic: Secondary | ICD-10-CM | POA: Insufficient documentation

## 2010-03-27 DIAGNOSIS — R05 Cough: Secondary | ICD-10-CM | POA: Insufficient documentation

## 2010-03-27 DIAGNOSIS — R062 Wheezing: Secondary | ICD-10-CM | POA: Insufficient documentation

## 2010-04-02 LAB — URINALYSIS, ROUTINE W REFLEX MICROSCOPIC
Bilirubin Urine: NEGATIVE
Nitrite: NEGATIVE
Specific Gravity, Urine: 1.013 (ref 1.005–1.030)
Urobilinogen, UA: 1 mg/dL (ref 0.0–1.0)
pH: 6.5 (ref 5.0–8.0)

## 2010-04-03 LAB — URINE CULTURE
Colony Count: >=100000
Culture  Setup Time: 201112131616

## 2010-04-03 LAB — URINE MICROSCOPIC-ADD ON

## 2010-04-03 LAB — URINALYSIS, ROUTINE W REFLEX MICROSCOPIC
Nitrite: NEGATIVE
Protein, ur: 30 mg/dL — AB
Urobilinogen, UA: 0.2 mg/dL (ref 0.0–1.0)

## 2010-04-05 LAB — CBC
HCT: 38.8 % (ref 36.0–46.0)
MCHC: 33.8 g/dL (ref 30.0–36.0)
MCV: 93.1 fL (ref 78.0–100.0)
RDW: 14 % (ref 11.5–15.5)

## 2010-04-05 LAB — URINALYSIS, ROUTINE W REFLEX MICROSCOPIC
Glucose, UA: NEGATIVE mg/dL
Hgb urine dipstick: NEGATIVE
Ketones, ur: NEGATIVE mg/dL
Protein, ur: NEGATIVE mg/dL

## 2010-04-05 LAB — DIFFERENTIAL
Lymphocytes Relative: 28 % (ref 12–46)
Lymphs Abs: 3.2 10*3/uL (ref 0.7–4.0)
Monocytes Relative: 7 % (ref 3–12)
Neutro Abs: 7 10*3/uL (ref 1.7–7.7)
Neutrophils Relative %: 62 % (ref 43–77)

## 2010-04-05 LAB — COMPREHENSIVE METABOLIC PANEL
BUN: 9 mg/dL (ref 6–23)
Calcium: 9.1 mg/dL (ref 8.4–10.5)
Glucose, Bld: 88 mg/dL (ref 70–99)
Total Protein: 7.9 g/dL (ref 6.0–8.3)

## 2010-04-18 ENCOUNTER — Other Ambulatory Visit: Payer: Self-pay | Admitting: Obstetrics and Gynecology

## 2010-04-18 DIAGNOSIS — Z1231 Encounter for screening mammogram for malignant neoplasm of breast: Secondary | ICD-10-CM

## 2010-05-07 ENCOUNTER — Encounter: Payer: Self-pay | Admitting: Pulmonary Disease

## 2010-05-08 ENCOUNTER — Ambulatory Visit: Payer: No Typology Code available for payment source | Admitting: Pulmonary Disease

## 2010-05-14 ENCOUNTER — Ambulatory Visit: Payer: No Typology Code available for payment source

## 2010-05-18 ENCOUNTER — Ambulatory Visit: Payer: No Typology Code available for payment source

## 2010-05-22 ENCOUNTER — Ambulatory Visit
Admission: RE | Admit: 2010-05-22 | Discharge: 2010-05-22 | Disposition: A | Discharge status: Home / Self Care | Payer: No Typology Code available for payment source | Source: Ambulatory Visit | Attending: Obstetrics and Gynecology | Admitting: Obstetrics and Gynecology

## 2010-05-22 DIAGNOSIS — Z1231 Encounter for screening mammogram for malignant neoplasm of breast: Secondary | ICD-10-CM

## 2010-05-24 ENCOUNTER — Ambulatory Visit: Payer: No Typology Code available for payment source | Admitting: Cardiology

## 2010-06-02 ENCOUNTER — Emergency Department (HOSPITAL_COMMUNITY)
Admission: EM | Admit: 2010-06-02 | Discharge: 2010-06-02 | Disposition: A | Discharge status: Home / Self Care | Payer: No Typology Code available for payment source | Attending: Emergency Medicine | Admitting: Emergency Medicine

## 2010-06-02 DIAGNOSIS — J3489 Other specified disorders of nose and nasal sinuses: Secondary | ICD-10-CM | POA: Insufficient documentation

## 2010-06-02 DIAGNOSIS — J069 Acute upper respiratory infection, unspecified: Secondary | ICD-10-CM | POA: Insufficient documentation

## 2010-06-02 DIAGNOSIS — I10 Essential (primary) hypertension: Secondary | ICD-10-CM | POA: Insufficient documentation

## 2010-06-05 NOTE — Assessment & Plan Note (Signed)
[facility]                            CARDIOLOGY OFFICE NOTE   [name], [name]                         MRN:          [id number]  DATE:03/05/2007                            DOB:          18-Sep-1960    PRIMARY:  Corwin Levins, MD.   REASON FOR PRESENTATION:  Evaluate patient with chest pain.   HISTORY OF PRESENT ILLNESS:  The patient developed some left arm  heaviness on Sunday.  On Monday she went to school and developed some  sharp chest pains, this was on the right side of her chest.  She took  some clonidine.  She went to lay down and actually felt like she was  gasping for air.  She went to the he ER, and the chest x-ray  demonstrated some questionable mild vascular congestion.  The emergency  room physician increased her Lasix from 40 b.i.d. to 80 b.i.d..  She was  also given a higher dose of potassium.  I do note that her potassium was  3.2 while she was seen in the emergency room.  Her D-dimer was negative.  I did not see a BMP level.  Point of care markers x1 were negative.   Since being home, the patient has had none of the chest discomfort.  She  thinks that her breathing is better than it had been.  She has not been  doing any exerting activity, but with her activities of daily living.  She denies any shortness of breath.  She did not have any PND or  orthopnea.  She is not having any chest pressure, neck or arm  discomfort.  She had not had any palpitations, presyncope or syncope.   PAST MEDICAL HISTORY:  1. Trivial coronary artery disease and catheterization in 2001.  2. Mild dyslipidemia.  3. Obesity.  4. Sleep apnea with CPAP.  5. Asthma.  6. Hypertension.  7. Migraines.  8. Sinus surgery.  9. Tonsillectomy.  10.C-section.   ALLERGIES:  SULFA, NEOMYCIN, CIPROFLOXACIN, VICODIN, and DOXYCYCLINE.   MEDICATIONS:  1. K-Dur 20 mEq b.i.d.  2. Clonidine 0.3 mg b.i.d.  3. Diovan HCT 160/12.5 b.i.d.  4. Lasix 80 mg b.i.d.   REVIEW OF SYSTEMS:  As stated in the HPI, otherwise negative for any  other systems.   PHYSICAL EXAMINATION:  The patient is in no acute distress.  Blood pressure 132/85, heart rate 92 and regular, weight 280 pounds,  body mass index 47.  HEENT:  Eyes are unremarkable.  Pupils equal, round, react to light.  Fundi not visualized.  Oral mucosa unremarkable.  NECK:  No jugular distention at 45 degrees.  Carotid upstroke brisk and  symmetrical.  No bruits, no thyromegaly.  LYMPHATICS:  No cervical, axillary, or inguinal adenopathy.  LUNGS:  Clear to auscultation bilaterally.  BACK:  No costovertebral angle tenderness.  CHEST:  Unremarkable.  HEART:  PMI not displaced or sustained, S1-S2 within normal limits.  No  S3-S4, no clicks, no rubs, no murmurs.  ABDOMEN:  Morbidly obese, and positive bowel sounds.  Normal in  frequency and pitch.  No bruits,  no rebound, no guarding in the midline.  No pulsatile mass.  No hepatosplenomegaly.  SKIN:  No rashes.  EXTREMITIES:  2+ pulses, edema, no cyanosis, no clubbing.  NEURO:  Oriented to person, place and time.  Cranial nerves II-XII  grossly intact, motor grossly intact.   EKG sinus rhythm, rate 92, right axis deviation, poor anterior R wave  progression, possible left atrial enlargement.  No acute ST-T wave  changes.   ASSESSMENT/PLAN:  1. The patient's chest discomfort is somewhat atypical.  I have like      to walk her on a treadmill, but she was unable to do this in the      past for unclear reasons.  She will need an adenosine Cardiolite.      Further evaluation will be based on these results.  2. Shortness of breath.  The patient did have some vague evidence of      edema on chest x-ray, and feels better after a higher dose of      diuretic.  She has high blood pressure, and this certainly is a set      up for diastolic dysfunction, which I think is probably playing a      role.  I am concerned about her potassium.  We will check a  BMET      today.  However, provided that her blood work tolerates it, and she      feels okay with it, I would continue the 80 mg b.i.d. of Lasix.      She will continue at this dose of potassium.  She needs weight      control and blood pressure control.  3. Hypertension.  Blood pressure is controlled today.  She needs      therapeutic lifestyle changes to maintain this.  4. Sleep apnea.  She is on CPAP and will continue this.   FOLLOWUP:  We will see the patient, again, in 3 months or sooner if  needed.     Rollene Rotunda, MD, [facility]  Electronically Signed    JH/MedQ  DD: 03/05/2007  DT: 03/07/2007  Job #: [id number]   cc:   Corwin Levins, MD

## 2010-06-05 NOTE — Letter (Signed)
April 28, 2007     RE:  [name], [name]  MRN:  [id number]  /  DOB:  April 30, 1960   To Whom It May Concern,   I recently saw [name] for evaluation of chest discomfort.  This is a  very atypical chest discomfort.  She did have a stress perfusion study  demonstrating that she had normal left ventricular function and no  evidence of obstructive coronary disease.  In the past, she has had a  catheterization which has demonstrated only minimal asymptomatic  coronary disease.  This is not uncommon in the general population.  This  should in no way preclude the patient from qualifying for insurance.   If you have any questions about this letter, please call my office.  Thank you for your attention in this matter.    Sincerely,      Rollene Rotunda, MD, [facility]  Electronically Signed    JH/MedQ  DD: 04/28/2007  DT: 04/29/2007  Job #: [id number]

## 2010-06-05 NOTE — Assessment & Plan Note (Signed)
[facility]                            CARDIOLOGY OFFICE NOTE   NAME:[name], [name]                       MRN:          [id number]  DATE:07/30/2007                            DOB:          20-Feb-1960    PRIMARY CARE PHYSICIAN:  Corwin Levins, MD.   REASON FOR PRESENTATION:  Evaluate the patient with chest pain and  shortness of breath.   HISTORY OF PRESENT ILLNESS:  The patient presents for followup of the  above.  At the last visit, I increased her diuretic because of some  shortness of breath and evidence of diastolic dysfunction.  She says she  has been less short of breath since that time.  She has had no PND or  orthopnea.  She has had none of the chest discomfort that she had in  February.  At that time, I did send her for a stress perfusion study  which demonstrated no evidence of ischemia or infarct.  She has had  continued weight gain which does not appear to be fluid.  She has had  pain in her legs.  This seems to be sharp pain radiating down from her  hips.  The patient did see Dr. Everardo All recently, and her blood pressures  are apparently running low.  He reduced her clonidine to 0.2 mg twice a  day.   PAST MEDICAL HISTORY:  1. Trivial coronary artery disease on catheterization in 2001.  2. Mild dyslipidemia.  3. Obesity.  4. Sleep apnea with CPAP.  5. Asthma.  6. Hypertension.  7. Migraines.  8. Sinus surgery.  9. Tonsillectomy.  10.C-section.   ALLERGIES:  1. SULFA.  2. NEOMYCIN.  3. CIPROFLOXACIN.  4. VICODIN.  5. DOXYCYCLINE.   MEDICATIONS:  1. Lasix 80 mg b.i.d.  2. Diovan HCT 160/12.5 b.i.d.  3. K-Dur 20 mEq b.i.d.  4. Clonidine 0.2 mg b.i.d..   REVIEW OF SYSTEMS:  As stated in the HPI and otherwise negative for  other systems.   PHYSICAL EXAMINATION:  GENERAL:  The patient is in no acute distress.  VITAL SIGNS:  Blood pressure 158/78, heart rate 94 and regular, weight  291 pounds, and body mass index 52.  NECK:  No obvious jugular venous distention at 45 degrees.  Carotid  upstrokes brisk and symmetrical.  No bruits.  No thyromegaly.  LYMPHATICS:  No adenopathy.  LUNGS:  Clear to auscultation bilaterally.  BACK:  No costovertebral angle tenderness.  CHEST:  Normal.  HEART:  PMI not displaced or sustained, S1 and S2 within normal.  No S3,  no S4, no clicks, no rubs, and no murmurs.  ABDOMEN:  Morbidly obese.  Positive bowel sounds.  Normal in frequency  and pitch.  No bruits, no rebound, no guarding, no midline pulsatile  mass, no hepatomegaly, and no splenomegaly.  SKIN:  No rashes and no nodules.  EXTREMITIES:  Pulses 2+.  No edema, no cyanosis, and no clubbing.  NEUROLOGIC:  Oriented to person, place, and time.  Cranial nerves II  through XII grossly intact.  Motor grossly intact.   ASSESSMENT AND PLAN:  1. Diastolic heart failure.  The patient has a well-preserved ejection      fraction but evidence of volume overload in the past.  She is doing      better with a higher dose of diuretic.  Control of her blood      pressure and management of her weight will be key.  She also needs      salt restriction which we have discussed.  She is going to get a      BMET today.  Provided this is okay, I will make no change in her      medical regimen.  2. Hypertension.  Blood pressure is slightly elevated today.  However,      she recently had her medications adjusted by Dr. Everardo All.  I will      make any other adjustments but would defer to one physician to      manage this.  3. Obesity.  This is the patient's most significant problem in my      mind.  However, she continues to gain weight rather than lose.  I      do not think she is going to do well long-term with her medical      problems.  Given this, I encourage weight loss with diet and      exercise.  4. Followup.  I will see her back in 1 year or sooner if needed.     Rollene Rotunda, MD, [facility]  Electronically Signed    JH/MedQ   DD: 07/30/2007  DT: 07/31/2007  Job #: [id number]

## 2010-06-08 NOTE — Assessment & Plan Note (Signed)
[facility]                                 ON-CALL NOTE   NAME:BURNSDenae, [name]                         MRN:          [id number]  DATE:03/29/2006                            DOB:          December 17, 1960    Patient of Dr. Everardo All.  Complaining of bilateral ear infection.  She  called back this morning from [phone] saying she would come into the  clinic to be evaluated.     Lelon Perla, DO  Electronically Signed    Shawnie Dapper  DD: 03/29/2006  DT: 03/29/2006  Job #: [id number]   cc:   Gregary Signs A. Everardo All, MD

## 2010-06-08 NOTE — H&P (Signed)
NAME:  [name], [name]                          ACCOUNT NO.:  [id number]   MEDICAL RECORD NO.:  [id number]                   PATIENT TYPE:  OIB   LOCATION:  3311                                 FACILITY:  [facility]   PHYSICIAN:  Hermelinda Medicus, M.D.                DATE OF BIRTH:  1960-04-02   DATE OF ADMISSION:  04/29/2003  DATE OF DISCHARGE:                                HISTORY & PHYSICAL   This patient is a 50 year old female who has had previous  palatopharyngoplasty.  She, however, has also had a severe sleep apnea  problem and has been on CPAP.  She has been very fatigued with the CPAP, has  not worked well for her, and she would like to try to further advance this  to see if she can avoid the CPAP in the future.  She has gained considerable  amount of weight in the past and now weighs 302 pounds.  She has also been  evaluated by Dr. Marcelyn Bruins and has had a full evaluation done March 10, 2003.  We did a second polysomnogram on January 17, 2003, and she came  up with deep sleep time of 32%, a 79% lowest O2 saturation.  CPAP titration  study reveals good control, but she has an obstructive sleep apnea of 18 cm  of water which is considerable pressure forcing air within her nose.  She is  in an effort to try to improve her CPAP functioning, and, with this, we hope  we can get her back to a more normal situation.  If we can get her to have  more effective CPA and lose some of the weight that she has gained recently,  then we can improved her whole lifestyle.   Her further problem is that she has had a coronary workup where she had a  history of hypertension and hyperlipidemia, had a cardiac catheterization  because of chest pain, and was found to have trivial coronary artery disease  with a normal left ventricular systolic function.  The ejection fraction was  greater than 60%.  No further cardiac workup was recommended, but she was  then referred to me for ENT further sleep  apnea surgery to see if she could  be free to the CPAP or at least set the stage so it would work more  effectively.   Her past history is remarkable for the fact that she takes Allegra D.  She  takes Diovan, hydrochlorothiazide 160.  She has been on Augmentin recently  for a sinus infection and takes Advil.   ALLERGIES:  SULFA, NEOMYCIN, and HYDROCORTISONE CREAM.   PAST SURGICAL HISTORY:  1. She has had a hysterectomy.  2. She has also had previous sinus surgery in an effort to clear her sinus     status.  3. Tubal ligation.  4. T&A.  5. Cardiac catheterization done in 2001 and then in  2004.   PHYSICAL EXAMINATION:  VITAL SIGNS:  Blood pressure 182/98.  She is 5 feet 3  inches and weights 302.  Respiratory rate 20, heart rate 98.  HEENT:  Ears are clear.  Tympanic membranes are clear.  The nose is clear  except she has very large turbinates and obstructive nasal airway.  She has  had septal surgery in the past, and her septum is quite straight.  CARDIAC:  No __________ murmurs, or gallops.  CHEST:  Clear.  No rales, rhonchi, or wheezes.  ABDOMEN:  Considerably obese.  EXTREMITIES:  Unremarkable.   INITIAL DIAGNOSES:  1. Sleep apnea.  2. History of palatopharyngoplasty.  3. History of sinusitis, having had functional endoscopic sinus surgery and     a septal reconstruction.  4. History of CPAP that has failed.  5. History of trivial coronary artery disease with normal left ventricular     function.  6. History of hysterectomy and bilateral tubal ligation.  7. History of hypercholesterolemia.                                                Hermelinda Medicus, M.D.    JC/MEDQ  D:  04/29/2003  T:  04/29/2003  Job:  [id number]   cc:   Gregary Signs A. Everardo All, M.D. [facility]   Marcelyn Bruins, M.D. [facility]

## 2010-06-08 NOTE — Assessment & Plan Note (Signed)
[facility]                                 ON-CALL NOTE   NAME:BURNSLoraina, [name]                         MRN:          [id number]  DATE:03/01/2006                            DOB:          December 02, 1960    [phone]   Patient of Dr. Everardo All.  Patient calling because she has a head  congestion and scratchy throat.  She has had it for 1 day.  Says she has  hypertension, does not know what over-the-counter medication to take.   I explained to her this is most likely a viral syndrome.  The treatment  is symptomatic with Tylenol, lots of liquids, and no over-the-counter  cough and cold and preparations because of her hypertension.     Jeffrey A. Tawanna Cooler, MD  Electronically Signed    JAT/MedQ  DD: 03/01/2006  DT: 03/01/2006  Job #: [id number]

## 2010-06-08 NOTE — Assessment & Plan Note (Signed)
[facility]                                 ON-CALL NOTE   NAME:BURNSRidhima, Golberg                       MRN:          [id number]  DATE:03/29/2006                            DOB:          August 24, 1960    She called at 8:16 a.m. on March 29, 2006 from [phone].  This is a  patient of Dr. George Hugh.  She called stating she has had ear pain for  over a month.  Was seen once and given Ceftin, and said she could not  get in Friday because of school, so was told to call today to come in to  [facility].  Patient will come in at 9 o'clock to be evaluated.     Lelon Perla, DO  Electronically Signed    Shawnie Dapper  DD: 03/29/2006  DT: 03/29/2006  Job #: [id number]   cc:   Gregary Signs A. Everardo All, MD

## 2010-06-08 NOTE — Assessment & Plan Note (Signed)
[facility]                                 ON-CALL NOTE   NAME:BURNSLavana, Huckeba                       MRN:          [id number]  DATE:01/11/2006                            DOB:          1960-08-03    Phone number [phone].  Patient of Dr. Everardo All.   She called on December 22 about 8 a.m.  [name] is having problems  with her foot.  She cannot really walk on it, it is a little bit  swollen, not really red.  She has a history of gout in the past.  She  did not really injure it.   PLAN:  She is going to come into the office today at about 11:30, and we  will take a look at it.     Karie Schwalbe, MD  Electronically Signed    RIL/MedQ  DD: 01/11/2006  DT: 01/12/2006  Job #: [id number]   cc:   Gregary Signs A. Everardo All, MD

## 2010-06-08 NOTE — Assessment & Plan Note (Signed)
[facility]                              CARDIOLOGY OFFICE NOTE   NAME:[name] [name] NEIDHARDT                       MRN:          [id number]  DATE:08/26/2005                            DOB:          1960/12/07    [name] [name] is a 50 year old female patient followed by Dr. Antoine Poche with a  history of trivial coronary disease by catheterization in 2001.  She  presents to the office today for followup.  The patient has had chronic  chest pain for quite some time now.  She was evaluated with a stress Myoview  study back in June of 2006.  This raised the question of a possibility of  some very localized mild ischemia in the apex.  Dr. Antoine Poche thought this  was low risk and she was treated medically.  The patient notes that she  recently saw Dr. Revonda Standard who told her cholesterol was high and started her  on Pravachol.  She wanted to make a followup appointment today as she  thought she may need to be set up for cardiac catheterization.  In talking  to her, her chest pain is about the same as it has been for quite some time  now.  She notes that she will occasionally awaken in the middle of the night  with this pain.  It is a sharp type of pain that lasts maybe 5 minutes.  She  feels it in her legs.  She denies any associated nausea or diaphoresis.  She  denies any shortness of breath.  She denies any syncope or presyncope.  She  says that she sleeps on 2-3 pillows, but denies any paroxysmal nocturnal  dyspnea.   CURRENT MEDICATIONS:  Lasix 40 mg twice a day, Diovan 160/12.5 mg b.i.d.,  Clonidine 0.3 mg b.i.d., K-Dur 20 mEq daily, Topamax 100 mg 2 q.h.s.[name]   ALLERGIES:  SULFA, ERYTHROMYCIN, CIPRO AND VICODIN.   SOCIAL HISTORY:  The patient denies any tobacco or alcohol abuse.   REVIEW OF SYSTEMS:  Per HPI.  She does admit to water brash symptoms.  Denies any dysphagia or odynophagia.  No hematochezia, hematuria or dysuria.  The rest of her review of symptoms are  negative.   PHYSICAL EXAMINATION:  GENERAL:  She is a well-nourished, well-developed  female in no acute distress.  VITAL SIGNS:  Blood pressure 160/69.  Pulse 64.  Weight 250 pounds.  HEENT:  Unremarkable.  NECK:  Without JVD, __________  1-2.  CARDIOVASCULAR:  Regular rate and rhythm.  Without murmurs, clicks, rubs or  gallops.  LUNGS:  Clear to auscultation bilaterally.  ABDOMEN:  Soft.  Normoactive bowel sounds.  Positive epigastric tenderness  to deep palpation, which is mild.  No organomegaly.  EXTREMITIES:  Without edema.  Calves are soft, nontender.  NEUROLOGIC:  She is alert and oriented x3.  Cranial nerves II-XII are  grossly intact.  No focal deficits noted.  SKIN:  Warm and dry.   Electrocardiogram reveals sinus rhythm with a heart rate of 69.  No  significant change in previous tracing.   IMPRESSION:  1.  Chest  discomfort.  2.  Trivial coronary disease by catheterization 2001.      1.  Low risk Myoview scan in June 2006.  3.  Good left ventricular function.  4.  Dyslipidemia.      1.  Statin recently started.  5.  Treated hypertension.  6.  Sleep apnea on CPAP.  7.  Asthma.  8.  Obesity.  9.  Migraine headaches.   PLAN:  The patient has symptoms of atypical chest pain.  These are the same  chest pains that she has had for quite some time now.  She has noted some  worsening symptoms here of late.  She does sometimes get it with exertion,  but she also gets it at rest.  She does admit to some water brash symptoms.  She has had acid reflux in the past.  I think it is worthwhile starting her  on a proton pump inhibitor at this point in time.  I discussed her case with  Dr. Antoine Poche as well.  I considered whether or not we should set her up with  a cardiac CT, but we are concerned that this would be abnormal given her  history of trivial CAD in the past.  Will go ahead and set her up for a  stress Myoview scan since it has been more than a year since she has had  her  last one.  I will make sure that she is on an aspirin a day.  Will place her  on Protonix 40 mg a day.  She will continue her other medications the same  at this point.  I will bring her back in followup with Dr. Antoine Poche in the  next 3-4 weeks or sooner if necessary.                                  [name] Newcomer, PA-C                            Rollene Rotunda, MD, [facility]   SW/MedQ  DD:  08/26/2005  DT:  08/26/2005  Job #:  [id number]   cc:   Gregary Signs A. Everardo All, MD

## 2010-06-08 NOTE — Assessment & Plan Note (Signed)
[facility] [place]                              CARDIOLOGY OFFICE NOTE   NAME:[name], [name]                       MRN:          [id number]  DATE:09/20/2005                            DOB:          21-Feb-1960    The primary is Corwin Levins, MD   REASON FOR PRESENTATION:  Evaluate patient for chest pain.   HISTORY OF PRESENT ILLNESS:  The patient was seen earlier this month for  chest discomfort.  It was atypical.  She had a stress perfusion study, which  demonstrated no evidence of ischemia or infarct.  The EF was 73%.  Since  then she said she has had no further chest discomfort.  She walks.  She does  not get any chest pressure, neck discomfort or arm discomfort.  She is not  complaining of palpitations, no presyncope or syncope.  She says she is  having constipation and weight gain and believes this to be related to her  Pravachol and is stopping this medication.   PAST MEDICAL HISTORY:  1. Trivial coronary artery disease on catheterization in 2001.  2. Mild dyslipidemia.  3. Obesity.  4. Sleep apnea with CPAP.  5. Asthma.  6. Hypertension.  7. Migraines.  8. Sinus surgery.  9. Tonsillectomy.  10.C-section.   ALLERGIES:  SULFA, NEOMYCIN, CIPROFLOXACIN, VICODIN, DOXYCYCLINE.   MEDICATIONS:  1. Lasix 40 mg b.i.d.  2. Diovan 160/12.5 mg b.i.d.  3. Clonidine 0.2 mg b.i.d.  4. K-Dur.  5. Topamax.  6. Aspirin.  7. Pravachol 40 mg q.h.s. (the patient stopped).   REVIEW OF SYSTEMS:  As stated in the HPI, otherwise negative for other  systems.   PHYSICAL EXAMINATION:  GENERAL:  The patient is in no distress.  VITAL SIGNS:  Blood pressure 124/79, heart rate 57 and regular, weight 255  pounds.  NECK:  No jugular venous distention.  Wave form within normal limits.  Carotid upstroke brisk and symmetric.  No bruits.  LUNGS:  Clear to auscultation bilaterally.  CHEST:  Unremarkable.  CARDIAC:  PMI not displaced or sustained.  S1 and S2 within  normal limits,  no S3, no S4, no murmurs.  ABDOMEN:  Obese, positive bowel sounds, without rebound or guarding.  EXTREMITIES:  2+ pulses, no edema.   ASSESSMENT AND PLAN:  1. Chest discomfort.  The patient had trivial coronary disease 6 years ago      on catheterization.  She had a negative stress perfusion study.  Her      symptoms are atypical.  There is no objective evidence of ischemia.  No      further cardiovascular testing is suggested.  She will follow up with      Dr. Everardo All if these complaints continue.  2. Hypertension.  Blood pressure is controlled.  She will continue      medications as listed.  3. Obesity.  She understands that you lose weight with diet and exercise.  4. Dyslipidemia.  She is not willing to take any statins.  She might be      able to control this with diet  and will follow with Dr. Everardo All.  5. Follow-up will be in this clinic as needed.                               Rollene Rotunda, MD, [facility]    JH/MedQ  DD:  09/20/2005  DT:  09/20/2005  Job #:  [id number]   cc:   Gregary Signs A. Everardo All, MD

## 2010-06-08 NOTE — Op Note (Signed)
NAME:  [name], [name]                          ACCOUNT NO.:  [id number]   MEDICAL RECORD NO.:  [id number]                   PATIENT TYPE:  OIB   LOCATION:                                       FACILITY:  [facility]   PHYSICIAN:  Hermelinda Medicus, M.D.                DATE OF BIRTH:  1960/04/05   DATE OF PROCEDURE:  DATE OF DISCHARGE:                                 OPERATIVE REPORT   This patient is a 50 year old female who now weighs 302 pounds and is having  extreme difficulty with her CPAP.  She has it turned up to 18 cm/H2O.  She  has had septal and sinus surgery in the past, but she has very large  turbinates blocking her nose.  She has also had a tonsillectomy in the past  and yet has considerable scar tissue around her palate and we will try to  improve this situation.   PREOPERATIVE DIAGNOSES:  1. Sleep apnea, continuous positive airway pressure (CPAP) failed, with     morbid obesity.  2. 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   POSTOPERATIVE DIAGNOSES:  1. Sleep apnea, CPAP failed, with morbid obesity.  2. 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   OPERATION:  A palatopharyngoplasty with reduction of turbinates.   OPERATOR:  Dr. Hermelinda Medicus.   ANESTHESIA:  General endotracheal with Dr. [name] Balsam.   PROCEDURE:  Patient placed in the supine position.  Under general  endotracheal anesthesia, the nose was anesthetized 1% Xylocaine with  epinephrine and topical cocaine 200 mg.  The turbinates were reduced  severely to the lateral in an effort to gain some further space;  however,  we did not remove any mucous membrane in effort to keep the nose well  humidified.  We were able to gain a great deal of space here.  The septum  was also straight, so the turbinate reduction would be all that we would do  here.  Intranasal dressing was placed.  We then approached the palatal  region where the tonsils had been removed, but the palate was low,  redundant, and there was considerable scar tissue and fibrous adhesions  around the tonsillar region.  We removed these fibrous adhesions which  limited her oropharynx and then did a palatopharyngoplasty where we shaved  all the mucous membrane but made some vertical cuts also in the palate each  side of the uvula.  We also trimmed the uvula as this was enlarged.  We  closed the mucous membrane of the uvula using 5-0 plain catgut.  Once this  was completed, the palate was considerably higher  and more space was afforded here in this area.  The nasopharynx was  suctioned. The oropharynx and stomach were suctioned.  The nasal dressing  was removed, and anesthesia trumpets were placed.  The patient tolerated the  procedure well and is doing well postop.  Hermelinda Medicus, M.D.    JC/MEDQ  D:  04/29/2003  T:  04/29/2003  Job:  [id number]   cc:   Gregary Signs A. Everardo All, M.D. [facility]   Marcelyn Bruins, M.D. [facility]

## 2010-06-08 NOTE — Assessment & Plan Note (Signed)
[facility]                              CARDIOLOGY OFFICE NOTE   NAME:Charlotte, [name]                       MRN:          [id number]  DATE:12/11/2005                            DOB:          October 08, 1960    PRIMARY CARE PHYSICIAN:  Corwin Levins, MD   REASON FOR PRESENTATION:  Evaluate patient with hypertension.   HISTORY OF PRESENT ILLNESS:  The patient returns for followup. She has had  no problems since I last saw her.  That is, she has had no chest pain or  shortness of breath.  She has had no palpitations, presyncope, or syncope.  She has had some burning in her stomach.   PAST MEDICAL HISTORY:  1. Trivial coronary artery disease in catheterization in 2001.  2. Mild dyslipidemia.  3. Obesity.  4. Sleep apnea with CPAP.  5. Asthma.  6. Hypertension.  7. Migraines.  8. Sinus surgery.  9. Tonsillectomy.  10.C section.   ALLERGIES:  SULFA, NEOMYCIN, CIPROFLOXACIN, VICODIN, DOXYCYCLINE.   MEDICATIONS:  1. Lasix 40 mg b.i.d.  2. Diovan/hydrochlorothiazide 160/12.5 b.i.d.  3. Clonidine 0.3 b.i.d.  4. Topamax 200 mg daily.   REVIEW OF SYSTEMS:  As stated in the HPI, otherwise negative for other  systems.   PHYSICAL EXAMINATION:  GENERAL:  The patient is in no distress.  VITAL SIGNS: Blood pressure 126/78, heart rate 59 and regular.  NECK:  No jugular venous distention at 45 degrees.  Carotid upstroke brisk  and symmetric.  No thyromegaly.  LYMPHATICS:  Normal.  LUNGS: Clear to auscultation bilaterally.  HEART: PMI not displaced or sustained.  S1 and S2 within normal limits.  No  S3, no S4, no murmurs.  ABDOMEN: Obese, positive bowel sounds normal in frequency and pitch.  No  bruits, rebound, guarding, and no midline pulsatile mass, no organomegaly.  SKIN: No rashes, no nodules.  EXTREMITIES: 2+ pulses, no edema.   EKG: Right axis deviation, low voltage in the limb leads, poor anterior R  wave progression, no acute ST-T wave  changes.   ASSESSMENT AND PLAN:  1. Hypertension.  Blood pressure is well controlled.  2. No further cardiovascular testing is suggested.  3. She can continue with this medication and therapeutic lifestyle      changes.  4. Followup:  Will see her back as needed in this clinic.     Rollene Rotunda, MD, [facility]  Electronically Signed    JH/MedQ  DD: 12/11/2005  DT: 12/11/2005  Job #: [id number]   cc:   Corwin Levins, MD

## 2010-06-08 NOTE — Discharge Summary (Signed)
[place]  Patient:    [name], [name]                       MRN: [id number] Adm. Date:  [id number] Disc. Date: 11/30/99 Attending:  Daisey Must Dictator:   Joellyn Rued, P.A.C. CC:         Corwin Levins, M.D. [facility]   Referring Physician Discharge [place]  DATE OF BIRTH:  12-06-1960  SUMMARY OF HISTORY:  Ms. Gintz is a 50 year old black female who is referred to cardiology for exertional chest discomfort and equivocal Persantine Cardiolite study.  She describes a one-year history of exertional dyspnea and associated chest pressure with occasional right upper extremity tingling and associated nausea and vomiting.  She has not had any progression of symptoms over the recent months.  Her symptoms are relieved within five minutes with rest.  She has not had any rest or nocturnal symptoms.  A Persantine Cardiolite was performed, but due to the patients size, it was felt to be limited.  There is an anterior apical defect, suggestive of breast attenuation and small apical redistribution, and it was recommended that she had diagnostic coronary angiogram for exclusion of ischemic heart disease.  It was also felt that she should have a follow-up mammogram in regards to increased activity in the left anterior chest.  Her risk factors for coronary disease are notable for hypertension, obesity, and chronic orthopnea.  LABORATORY DATA:  Admission PT was 12.2.  Sodium 138, potassium 3.4, BUN 8, creatinine 0.8.  PTT 26.  H&H 13.5 and 40.5, normal indices, platelets 314, WBC 12.5.  EKG shows normal sinus rhythm.  HOSPITAL COURSE:  Cardiac catheterization was performed on November 8 by Dr. Chales Abrahams.  According to the progress notes, her catheterization showed trivial coronary artery disease and an ejection fraction of 60%.  It was felt that her discomfort was not cardiac in etiology.  Post catheterization, she was complaining of cough and pleuritic chest  discomfort under her left breast.  It was noted that just prior to admission, she was begun on antibiotics for a recent upper respiratory infection.  An ABG was performed on room air that showed a pH of 7.46, PCO2 32.3, PO2 127.  Initially scheduled a CT and mammogram; however, after reviewing the chart on November 9, Dr. Gerri Spore felt that her symptoms were related to her upper respiratory infection, and with a normal ABG, a CT scan was not necessary, and she had already had an outpatient mammogram arranged for Tuesday.  Thus, it is felt that she can be discharged home.  DISCHARGE DIAGNOSES: 1. Noncardiac chest discomfort, trivial coronary artery disease with normal    ejection fraction on catheterization. 2. Recent upper respiratory infection, currently on antibiotics.  DISPOSITION:  She is discharged home.  MEDICATIONS:  She is asked to continue her home medications.  These include: 1. Biaxin XL 1000 mg q.d. 2. Lasix 40 mg q.d. 3. K-Dur was increased to 20 mEq b.i.d. 4. Flonase as previously. 5. Coated aspirin 325 q.d.  ACTIVITY:  She was advised no lifting, driving, sexual activity, or heavy exertion for two days.  DIET:  Maintain low salt/fat/cholesterol diet.  WOUND CARE:  If she had any problems with her catheterization site, she was asked to call our office immediately.  FOLLOW-UP:  She was asked to keep her appointment for her mammogram on Tuesday at [place].  She will also call Dr. Jonny Ruiz this afternoon to arrange a  follow-up appointment.DD:  11/30/99 TD:  11/30/99 Job: [id number]

## 2010-06-08 NOTE — Cardiovascular Report (Signed)
[place]  Patient:    [name], [name]                       MRN: [id number] Proc. Date: 11/29/99 Adm. Date:  [id number] Attending:  Daisey Must CC:         Rollene Rotunda, M.D. [facility] Darrelyn Hillock, M.D. [facility]   Cardiac Catheterization  PROCEDURES PERFORMED: 1. Left heart catheterization. 2. Left ventriculogram. 3. Selective coronary angiography.  DIAGNOSES: 1. Trivial coronary artery disease by angiogram. 2. Normal left ventricular systolic function.  INDICATIONS:  [name] is a 50 year old black female with a family history of coronary artery disease, who presents with a one-year history of progressive exertional chest discomfort and dyspnea.  The patient has also had occasional right upper extremity tingling, nausea and vomiting.  These symptoms have progressed some over the last several months and appear to be relieved by rest.  She underwent a stress imaging study suggesting an apical defect. Breast attenuation could not be excluded.  Due to symptoms and equivocal stress imaging study she presents now for further assessment of her coronary anatomy.  TECHNIQUE:  After informed consent was obtained, the patient was brought to the cardiac catheterization lab where both groins were sterilely prepped and draped.  Lidocaine 1% was used to infiltrate the right groin.  A 6 French sheath placed into the right femoral vein after this was cannulated and a SmartNeedle was used to identify the right femoral artery.  A 6 French sheath was then placed in this vessel.  A 6 [place] and JR4 catheters were then used to engage the left and right coronary arteries and selective angiography performed in various injections using manual injections of contrast.  A 6 French pigtail catheter was then advanced to the left ventricle and a left ventriculogram performed using power injections of contrast.  At the termination of the case, the catheters  and sheaths were removed and manual pressure applied until adequate hemostasis was achieved.  The patient tolerated the procedure well and was transferred to the floor in stable condition.  FINDINGS:  Findings are as follows:  Left main trunk:  The left main trunk is large caliber vessel, short, angiographically normal.  Left anterior descending:  This is a large caliber vessel that provides three small diagonal branches.  The LAD has trivial irregularities.  Left circumflex artery:  This is a medium caliber vessel that consists of two marginal branches that arise ______ in the vessel and a diminutive AV circumflex artery.  The left circumflex system has trivial irregularities.  Right coronary artery:  This is a medium caliber vessel that provides a small posterior descending artery and posterior ventricular branch in its terminal segment.  The right coronary artery has luminal irregularities.  LEFT VENTRICULOGRAM:  Normal end-systolic and end-diastolic dimensions. Overall left ventricular function is well preserved, ejection fraction of greater than 60%.  No mitral regurgitation.  LV pressure is 152/10, aortic is 152/95, LVEDP equals 28.  ASSESSMENT AND PLAN:  [name] is a 50 year old lady with trivial coronary artery disease and normal left ventricular function by angiogram.  At this point other causes of her symptoms will be investigated and attention paid to risk factor modification. DD:  11/29/99 TD:  11/29/99 Job: [id number]

## 2010-06-19 ENCOUNTER — Encounter: Payer: Self-pay | Admitting: Cardiology

## 2010-06-20 ENCOUNTER — Emergency Department (HOSPITAL_COMMUNITY): Payer: No Typology Code available for payment source

## 2010-06-20 ENCOUNTER — Emergency Department (HOSPITAL_COMMUNITY)
Admission: EM | Admit: 2010-06-20 | Discharge: 2010-06-20 | Disposition: A | Discharge status: Home / Self Care | Payer: No Typology Code available for payment source | Attending: Emergency Medicine | Admitting: Emergency Medicine

## 2010-06-20 DIAGNOSIS — R05 Cough: Secondary | ICD-10-CM | POA: Insufficient documentation

## 2010-06-20 DIAGNOSIS — J40 Bronchitis, not specified as acute or chronic: Secondary | ICD-10-CM | POA: Insufficient documentation

## 2010-06-20 DIAGNOSIS — R059 Cough, unspecified: Secondary | ICD-10-CM | POA: Insufficient documentation

## 2010-06-20 DIAGNOSIS — R062 Wheezing: Secondary | ICD-10-CM | POA: Insufficient documentation

## 2010-06-20 DIAGNOSIS — I10 Essential (primary) hypertension: Secondary | ICD-10-CM | POA: Insufficient documentation

## 2010-06-21 ENCOUNTER — Telehealth: Payer: Self-pay | Admitting: Pulmonary Disease

## 2010-06-21 ENCOUNTER — Telehealth: Payer: Self-pay | Admitting: Cardiology

## 2010-06-21 DIAGNOSIS — I10 Essential (primary) hypertension: Secondary | ICD-10-CM

## 2010-06-21 MED ORDER — CLONIDINE HCL 0.2 MG PO TABS: 0.2000 mg | ORAL_TABLET | Freq: Two times a day (BID) | ORAL | Status: DC

## 2010-06-21 NOTE — Telephone Encounter (Signed)
clondine 0.2 mg- [facility] on [address].   [phone].

## 2010-06-21 NOTE — Telephone Encounter (Signed)
Pt advised we do not have any samples of albuterol inhalers at this time. Carron Curie, CMA

## 2010-06-30 ENCOUNTER — Emergency Department (HOSPITAL_COMMUNITY)
Admission: EM | Admit: 2010-06-30 | Discharge: 2010-06-30 | Disposition: A | Discharge status: Home / Self Care | Payer: No Typology Code available for payment source | Attending: Emergency Medicine | Admitting: Emergency Medicine

## 2010-06-30 DIAGNOSIS — I10 Essential (primary) hypertension: Secondary | ICD-10-CM | POA: Insufficient documentation

## 2010-06-30 DIAGNOSIS — R059 Cough, unspecified: Secondary | ICD-10-CM | POA: Insufficient documentation

## 2010-06-30 DIAGNOSIS — R05 Cough: Secondary | ICD-10-CM | POA: Insufficient documentation

## 2010-06-30 DIAGNOSIS — R071 Chest pain on breathing: Secondary | ICD-10-CM | POA: Insufficient documentation

## 2010-07-03 ENCOUNTER — Ambulatory Visit: Payer: No Typology Code available for payment source | Admitting: Cardiology

## 2010-07-06 ENCOUNTER — Encounter: Payer: Self-pay | Admitting: Cardiology

## 2010-07-13 ENCOUNTER — Encounter: Payer: Self-pay | Admitting: Pulmonary Disease

## 2010-07-16 ENCOUNTER — Ambulatory Visit: Payer: No Typology Code available for payment source | Admitting: Pulmonary Disease

## 2010-07-28 ENCOUNTER — Emergency Department (HOSPITAL_COMMUNITY)
Admission: EM | Admit: 2010-07-28 | Discharge: 2010-07-28 | Disposition: A | Discharge status: Home / Self Care | Payer: No Typology Code available for payment source | Attending: Emergency Medicine | Admitting: Emergency Medicine

## 2010-07-28 ENCOUNTER — Emergency Department (HOSPITAL_COMMUNITY): Payer: No Typology Code available for payment source

## 2010-07-28 DIAGNOSIS — J4489 Other specified chronic obstructive pulmonary disease: Secondary | ICD-10-CM | POA: Insufficient documentation

## 2010-07-28 DIAGNOSIS — S139XXA Sprain of joints and ligaments of unspecified parts of neck, initial encounter: Secondary | ICD-10-CM | POA: Insufficient documentation

## 2010-07-28 DIAGNOSIS — T148XXA Other injury of unspecified body region, initial encounter: Secondary | ICD-10-CM | POA: Insufficient documentation

## 2010-07-28 DIAGNOSIS — I10 Essential (primary) hypertension: Secondary | ICD-10-CM | POA: Insufficient documentation

## 2010-07-28 DIAGNOSIS — Y92410 Unspecified street and highway as the place of occurrence of the external cause: Secondary | ICD-10-CM | POA: Insufficient documentation

## 2010-07-28 DIAGNOSIS — J449 Chronic obstructive pulmonary disease, unspecified: Secondary | ICD-10-CM | POA: Insufficient documentation

## 2010-07-28 DIAGNOSIS — M546 Pain in thoracic spine: Secondary | ICD-10-CM | POA: Insufficient documentation

## 2010-08-18 ENCOUNTER — Emergency Department (HOSPITAL_COMMUNITY)
Admission: EM | Admit: 2010-08-18 | Discharge: 2010-08-18 | Disposition: A | Discharge status: Home / Self Care | Payer: No Typology Code available for payment source | Attending: Emergency Medicine | Admitting: Emergency Medicine

## 2010-08-18 DIAGNOSIS — R51 Headache: Secondary | ICD-10-CM | POA: Insufficient documentation

## 2010-08-18 DIAGNOSIS — J329 Chronic sinusitis, unspecified: Secondary | ICD-10-CM | POA: Insufficient documentation

## 2010-08-18 DIAGNOSIS — I10 Essential (primary) hypertension: Secondary | ICD-10-CM | POA: Insufficient documentation

## 2010-08-18 DIAGNOSIS — H9209 Otalgia, unspecified ear: Secondary | ICD-10-CM | POA: Insufficient documentation

## 2010-09-20 ENCOUNTER — Encounter: Payer: Self-pay | Admitting: Cardiology

## 2010-09-21 NOTE — Progress Notes (Signed)
This encounter was created in error - please disregard.

## 2010-09-25 ENCOUNTER — Ambulatory Visit: Payer: Self-pay | Admitting: Cardiology

## 2010-10-04 ENCOUNTER — Emergency Department (HOSPITAL_COMMUNITY)
Admission: EM | Admit: 2010-10-04 | Discharge: 2010-10-04 | Disposition: A | Discharge status: Home / Self Care | Payer: No Typology Code available for payment source | Attending: Emergency Medicine | Admitting: Emergency Medicine

## 2010-10-04 DIAGNOSIS — H81319 Aural vertigo, unspecified ear: Secondary | ICD-10-CM | POA: Insufficient documentation

## 2010-10-04 DIAGNOSIS — I10 Essential (primary) hypertension: Secondary | ICD-10-CM | POA: Insufficient documentation

## 2010-10-12 LAB — DIFFERENTIAL
Basophils Relative: 0
Monocytes Absolute: 0.6
Monocytes Relative: 6
Neutro Abs: 6.3

## 2010-10-12 LAB — CBC
Hemoglobin: 12
MCHC: 34.2
RBC: 3.77 — ABNORMAL LOW

## 2010-10-12 LAB — BASIC METABOLIC PANEL
CO2: 30
Calcium: 8.6
Chloride: 99
GFR calc Af Amer: >60
Sodium: 134 — ABNORMAL LOW

## 2010-10-12 LAB — D-DIMER, QUANTITATIVE: D-Dimer, Quant: 0.36

## 2010-10-12 LAB — POCT CARDIAC MARKERS
CKMB, poc: 1.8
Troponin i, poc: <0.05

## 2010-10-25 ENCOUNTER — Encounter: Payer: Self-pay | Admitting: Cardiology

## 2010-10-26 ENCOUNTER — Emergency Department (HOSPITAL_COMMUNITY): Payer: No Typology Code available for payment source

## 2010-10-26 ENCOUNTER — Emergency Department (HOSPITAL_COMMUNITY)
Admission: EM | Admit: 2010-10-26 | Discharge: 2010-10-26 | Disposition: A | Discharge status: Home / Self Care | Payer: No Typology Code available for payment source | Attending: Emergency Medicine | Admitting: Emergency Medicine

## 2010-10-26 DIAGNOSIS — I10 Essential (primary) hypertension: Secondary | ICD-10-CM | POA: Insufficient documentation

## 2010-10-26 DIAGNOSIS — R059 Cough, unspecified: Secondary | ICD-10-CM | POA: Insufficient documentation

## 2010-10-26 DIAGNOSIS — J40 Bronchitis, not specified as acute or chronic: Secondary | ICD-10-CM | POA: Insufficient documentation

## 2010-10-26 DIAGNOSIS — R05 Cough: Secondary | ICD-10-CM | POA: Insufficient documentation

## 2010-11-10 ENCOUNTER — Emergency Department (HOSPITAL_COMMUNITY)
Admission: EM | Admit: 2010-11-10 | Discharge: 2010-11-10 | Disposition: A | Discharge status: Home / Self Care | Payer: No Typology Code available for payment source | Attending: Emergency Medicine | Admitting: Emergency Medicine

## 2010-11-10 DIAGNOSIS — I10 Essential (primary) hypertension: Secondary | ICD-10-CM | POA: Insufficient documentation

## 2010-11-10 DIAGNOSIS — R05 Cough: Secondary | ICD-10-CM | POA: Insufficient documentation

## 2010-11-10 DIAGNOSIS — J40 Bronchitis, not specified as acute or chronic: Secondary | ICD-10-CM | POA: Insufficient documentation

## 2010-11-10 DIAGNOSIS — R059 Cough, unspecified: Secondary | ICD-10-CM | POA: Insufficient documentation

## 2010-11-29 ENCOUNTER — Ambulatory Visit: Payer: Self-pay | Admitting: Cardiology

## 2010-12-03 ENCOUNTER — Encounter: Payer: Self-pay | Admitting: Cardiology

## 2010-12-03 ENCOUNTER — Ambulatory Visit: Payer: Self-pay | Admitting: Cardiology

## 2010-12-03 ENCOUNTER — Ambulatory Visit (INDEPENDENT_AMBULATORY_CARE_PROVIDER_SITE_OTHER): Payer: No Typology Code available for payment source | Admitting: Cardiology

## 2010-12-03 DIAGNOSIS — G4733 Obstructive sleep apnea (adult) (pediatric): Secondary | ICD-10-CM

## 2010-12-03 DIAGNOSIS — I10 Essential (primary) hypertension: Secondary | ICD-10-CM

## 2010-12-03 DIAGNOSIS — R9431 Abnormal electrocardiogram [ECG] [EKG]: Secondary | ICD-10-CM

## 2010-12-03 MED ORDER — LOSARTAN POTASSIUM-HCTZ 100-25 MG PO TABS: 1.0000 | ORAL_TABLET | Freq: Every day | ORAL | Status: DC

## 2010-12-03 MED ORDER — POTASSIUM CHLORIDE ER 10 MEQ PO TBCR: 10.0000 meq | EXTENDED_RELEASE_TABLET | Freq: Every day | ORAL | Status: DC

## 2010-12-03 NOTE — Assessment & Plan Note (Signed)
Wt Readings from Last 3 Encounters:  12/03/10 297 lb (134.718 kg)  02/26/10 302 lb 4 oz (137.1 kg)  11/06/09 300 lb 8 oz (136.306 kg)  She understands the need to lose weight with diet and exercise.

## 2010-12-03 NOTE — Assessment & Plan Note (Addendum)
Her blood pressure is not at all at target.  We discussed therapeutic lifestyle changes to include weight loss.  She needs increased medications and I will increase her Hyzaar to 100/25. She will get a basic metabolic profile in 2 weeks. I have instructed her to get a blood pressure diary. She will get a blood pressure check in 2 weeks as well. As her potassium tends to run low I will give her 10 mEq daily.  Of note she is on the spironolactone strictly for treatment of hypertension.

## 2010-12-03 NOTE — Progress Notes (Signed)
HPI The patient presents for follow up of HTN.  Since I last saw her she has done well.  She is working full time as a Runner, broadcasting/film/video.  The patient denies any new symptoms such as chest discomfort, neck or arm discomfort. There has been no new shortness of breath, PND or orthopnea. There have been no reported palpitations, presyncope or syncope.  She is not exercising.  She has lost no weight.  Allergies  Allergen Reactions  . Ciprofloxacin   . Doxycycline     REACTION: n/v  . Hydrocortisone     REACTION: rash  . Neomycin   . Sulfonamide Derivatives     Current Outpatient Prescriptions  Medication Sig Dispense Refill  . albuterol (VENTOLIN HFA) 108 (90 BASE) MCG/ACT inhaler Inhale 2 puffs into the lungs every 6 (six) hours as needed.        . cloNIDine (CATAPRES) 0.2 MG tablet Take 1 tablet (0.2 mg total) by mouth 2 (two) times daily.  60 tablet  11  . fluticasone (FLONASE) 50 MCG/ACT nasal spray 2 sprays by Nasal route daily.        [name] HYDROcodone-acetaminophen (VICODIN) 5-500 MG per tablet Take 1 tablet by mouth every 6 (six) hours as needed.        [name] losartan-hydrochlorothiazide (HYZAAR) 50-12.5 MG per tablet Take 1 tablet by mouth daily.          Past Medical History  Diagnosis Date  . Hypokalemia   . CHF (congestive heart failure)   . Unspecified adverse effect of unspecified drug, medicinal and biological substance   . Otitis media   . External otitis   . Sinusitis   . Plantar fasciitis   . Family history of ischemic heart disease   . Common migraine   . Morbid obesity   . Hyperlipidemia   . OSA (obstructive sleep apnea)   . Carpal tunnel syndrome   . Hypertension   . CAD (coronary artery disease)   . Asthma   . Allergic rhinitis   . Routine general medical examination at a health care facility   . Shoulder and upper arm, insect bite, nonvenomous, without mention of infection   . Unspecified adverse effect of unspecified drug, medicinal and biological substance   . Acute  serous otitis media   . Diabetes mellitus     type 2  . Tubal ligation status   . Old myocardial infarction     Past Surgical History  Procedure Date  . Tubal ligation   . Tonsillectomy   . Uvulopalatopharyngoplasty   . Cesarean section   . Nasal sinus surgery   . Partial hysterectomy     ROS:  As stated in the HPI and negative for all other systems.  PHYSICAL EXAM BP 196/99  Pulse 59  Ht 5\' 4"  (1.626 m)  Wt 297 lb (134.718 kg)  BMI 50.98 kg/m2 GENERAL:  Well appearing HEENT:  Pupils equal round and reactive, fundi not visualized, oral mucosa unremarkable NECK:  No jugular venous distention, waveform within normal limits, carotid upstroke brisk and symmetric, no bruits, no thyromegaly LYMPHATICS:  No cervical, inguinal adenopathy LUNGS:  Clear to auscultation bilaterally BACK:  No CVA tenderness CHEST:  Unremarkable HEART:  PMI not displaced or sustained,S1 and S2 within normal limits, no S3, no S4, no clicks, no rubs, no murmurs ABD:  Flat, positive bowel sounds normal in frequency in pitch, no bruits, no rebound, no guarding, no midline pulsatile mass, no hepatomegaly, no splenomegaly, obese EXT:  2  plus pulses throughout, no edema, no cyanosis no clubbing SKIN:  No rashes no nodules NEURO:  Cranial nerves II through XII grossly intact, motor grossly intact throughout PSYCH:  Cognitively intact, oriented to person place and time   EKG:  Sinus rhythm, rate 62, poor anterior R wave progression, lateral T wave inversions. Rate 62 12/03/2010   ASSESSMENT AND PLAN

## 2010-12-03 NOTE — Assessment & Plan Note (Signed)
She uses CPAP 

## 2010-12-03 NOTE — Assessment & Plan Note (Signed)
She has new T wave inversion in her lateral leads compared with previous. I suspect this is hypertension. She has no new symptoms. Once her blood pressure is controlled I would like to order an exercise treadmill test.

## 2010-12-03 NOTE — Patient Instructions (Addendum)
Please increase your Hyzaar (losartan - HCTZ) to 100/25 mg a day Please start potassium Chloride 10 MEQ a day  Have blood work and a blood pressure check in 2 weeks  Follow up with Dr Antoine Poche Tereso Newcomer, PA in 1 month

## 2010-12-13 ENCOUNTER — Other Ambulatory Visit: Payer: Self-pay | Admitting: Cardiology

## 2010-12-17 ENCOUNTER — Ambulatory Visit (INDEPENDENT_AMBULATORY_CARE_PROVIDER_SITE_OTHER): Payer: No Typology Code available for payment source | Admitting: *Deleted

## 2010-12-17 DIAGNOSIS — E876 Hypokalemia: Secondary | ICD-10-CM

## 2010-12-17 DIAGNOSIS — I10 Essential (primary) hypertension: Secondary | ICD-10-CM

## 2010-12-17 LAB — BASIC METABOLIC PANEL
CO2: 26 mEq/L (ref 19–32)
Calcium: 8.9 mg/dL (ref 8.4–10.5)
Chloride: 104 mEq/L (ref 96–112)
Creatinine, Ser: 0.6 mg/dL (ref 0.4–1.2)
Sodium: 140 mEq/L (ref 135–145)

## 2010-12-18 MED ORDER — POTASSIUM CHLORIDE ER 10 MEQ PO TBCR: 20.0000 meq | EXTENDED_RELEASE_TABLET | Freq: Every day | ORAL | Status: DC

## 2010-12-26 ENCOUNTER — Other Ambulatory Visit: Payer: No Typology Code available for payment source | Admitting: *Deleted

## 2011-01-07 ENCOUNTER — Ambulatory Visit (INDEPENDENT_AMBULATORY_CARE_PROVIDER_SITE_OTHER): Payer: No Typology Code available for payment source | Admitting: Cardiology

## 2011-01-07 ENCOUNTER — Ambulatory Visit: Payer: No Typology Code available for payment source | Admitting: Cardiology

## 2011-01-07 ENCOUNTER — Encounter: Payer: Self-pay | Admitting: Cardiology

## 2011-01-07 VITALS — BP 150/70 | HR 72 | Ht 64.0 in | Wt 306.8 lb

## 2011-01-07 DIAGNOSIS — I10 Essential (primary) hypertension: Secondary | ICD-10-CM

## 2011-01-07 DIAGNOSIS — I428 Other cardiomyopathies: Secondary | ICD-10-CM

## 2011-01-07 DIAGNOSIS — I509 Heart failure, unspecified: Secondary | ICD-10-CM

## 2011-01-07 NOTE — Progress Notes (Signed)
HPI The patient presents for follow up of HTN.  At the last visit I increased her Hyzaar. Her blood pressure appears to be better controlled today. I don't have any readings from home. She has tolerated the medication without any presyncope or syncope. She did have hypokalemia on followup labs. We supplemented this.  The patient denies any new symptoms such as chest discomfort, neck or arm discomfort. There has been no new shortness of breath, PND or orthopnea. There have been no reported palpitations, presyncope or syncope.   Allergies  Allergen Reactions  . Ciprofloxacin   . Doxycycline     REACTION: n/v  . Hydrocortisone     REACTION: rash  . Neomycin   . Sulfonamide Derivatives     Current Outpatient Prescriptions  Medication Sig Dispense Refill  . albuterol (VENTOLIN HFA) 108 (90 BASE) MCG/ACT inhaler Inhale 2 puffs into the lungs every 6 (six) hours as needed.        . cloNIDine (CATAPRES) 0.2 MG tablet Take 1 tablet (0.2 mg total) by mouth 2 (two) times daily.  60 tablet  11  . fluticasone (FLONASE) 50 MCG/ACT nasal spray 2 sprays by Nasal route daily.        [name] losartan-hydrochlorothiazide (HYZAAR) 100-25 MG per tablet Take 1 tablet by mouth daily.  30 tablet  11  . potassium chloride (K-DUR) 10 MEQ tablet Take 2 tablets (20 mEq total) by mouth daily.  60 tablet  11    Past Medical History  Diagnosis Date  . Hypokalemia   . CHF (congestive heart failure)   . Unspecified adverse effect of unspecified drug, medicinal and biological substance   . Otitis media   . External otitis   . Sinusitis   . Plantar fasciitis   . Family history of ischemic heart disease   . Common migraine   . Morbid obesity   . Hyperlipidemia   . OSA (obstructive sleep apnea)   . Carpal tunnel syndrome   . Hypertension   . CAD (coronary artery disease)   . Asthma   . Allergic rhinitis   . Routine general medical examination at a health care facility   . Shoulder and upper arm, insect bite,  nonvenomous, without mention of infection   . Unspecified adverse effect of unspecified drug, medicinal and biological substance   . Acute serous otitis media   . Diabetes mellitus     type 2  . Tubal ligation status   . Old myocardial infarction     Past Surgical History  Procedure Date  . Tubal ligation   . Tonsillectomy   . Uvulopalatopharyngoplasty   . Cesarean section   . Nasal sinus surgery   . Partial hysterectomy     ROS:  As stated in the HPI and negative for all other systems.  PHYSICAL EXAM BP 150/70  Pulse 72  Ht 5\' 4"  (1.626 m)  Wt 306 lb 12.8 oz (139.164 kg)  BMI 52.66 kg/m2 GENERAL:  Well appearing HEENT:  Pupils equal round and reactive, fundi not visualized, oral mucosa unremarkable NECK:  No jugular venous distention, waveform within normal limits, carotid upstroke brisk and symmetric, no bruits, no thyromegaly LYMPHATICS:  No cervical, inguinal adenopathy LUNGS:  Clear to auscultation bilaterally BACK:  No CVA tenderness CHEST:  Unremarkable HEART:  PMI not displaced or sustained,S1 and S2 within normal limits, no S3, no S4, no clicks, no rubs, no murmurs ABD:  Flat, positive bowel sounds normal in frequency in pitch, no bruits, no rebound,  no guarding, no midline pulsatile mass, no hepatomegaly, no splenomegaly, obese EXT:  2 plus pulses throughout, no edema, no cyanosis no clubbing SKIN:  No rashes no nodules NEURO:  Cranial nerves II through XII grossly intact, motor grossly intact throughout PSYCH:  Cognitively intact, oriented to person place and time   ASSESSMENT AND PLAN

## 2011-01-07 NOTE — Assessment & Plan Note (Signed)
She seems to be euvolemic. I have not seen a recent estimation of her ejection fraction. I would like to reassess this. 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 I would like to clarify this.

## 2011-01-07 NOTE — Assessment & Plan Note (Signed)
Her blood pressure is much better today. I will check a basic metabolic profile. I would like her to keep a blood pressure diary or to get it checked where she works. The next step might be the addition of spironolactone. I reported in error that she was on this in the last dictation.

## 2011-01-07 NOTE — Assessment & Plan Note (Signed)
She reports that she is compliant with her CPAP. She needs weight loss.

## 2011-01-07 NOTE — Assessment & Plan Note (Signed)
This is her most significant problem. She should continue with management of this.

## 2011-01-07 NOTE — Patient Instructions (Addendum)
Please have blood work today. (BMP)  Your physician has requested that you have an echocardiogram. Echocardiography is a painless test that uses sound waves to create images of your heart. It provides your doctor with information about the size and shape of your heart and how well your heart's chambers and valves are working. This procedure takes approximately one hour. There are no restrictions for this procedure.  The current medical regimen is effective;  continue present plan and medications.  Follow up will be based on the results of your testing.

## 2011-01-08 ENCOUNTER — Other Ambulatory Visit: Payer: Self-pay | Admitting: *Deleted

## 2011-01-08 DIAGNOSIS — I10 Essential (primary) hypertension: Secondary | ICD-10-CM

## 2011-01-08 DIAGNOSIS — E876 Hypokalemia: Secondary | ICD-10-CM

## 2011-01-08 LAB — BASIC METABOLIC PANEL
Chloride: 103 mEq/L (ref 96–112)
GFR: 105.94 mL/min (ref >60.00)
Glucose, Bld: 78 mg/dL (ref 70–99)
Potassium: 3.2 mEq/L — ABNORMAL LOW (ref 3.5–5.1)
Sodium: 139 mEq/L (ref 135–145)

## 2011-01-08 MED ORDER — POTASSIUM CHLORIDE CRYS ER 20 MEQ PO TBCR: 40.0000 meq | EXTENDED_RELEASE_TABLET | Freq: Every day | ORAL | Status: DC

## 2011-01-13 ENCOUNTER — Emergency Department (HOSPITAL_COMMUNITY)
Admission: EM | Admit: 2011-01-13 | Discharge: 2011-01-13 | Disposition: A | Discharge status: Home / Self Care | Payer: No Typology Code available for payment source | Attending: Emergency Medicine | Admitting: Emergency Medicine

## 2011-01-13 ENCOUNTER — Encounter (HOSPITAL_COMMUNITY): Payer: Self-pay

## 2011-01-13 DIAGNOSIS — R51 Headache: Secondary | ICD-10-CM | POA: Insufficient documentation

## 2011-01-13 DIAGNOSIS — J329 Chronic sinusitis, unspecified: Secondary | ICD-10-CM | POA: Insufficient documentation

## 2011-01-13 DIAGNOSIS — I252 Old myocardial infarction: Secondary | ICD-10-CM | POA: Insufficient documentation

## 2011-01-13 DIAGNOSIS — I2510 Atherosclerotic heart disease of native coronary artery without angina pectoris: Secondary | ICD-10-CM | POA: Insufficient documentation

## 2011-01-13 MED ORDER — AMOXICILLIN-POT CLAVULANATE 875-125 MG PO TABS: 1.0000 | ORAL_TABLET | Freq: Two times a day (BID) | ORAL | Status: AC

## 2011-01-13 MED ORDER — HYDROCODONE-HOMATROPINE 5-1.5 MG/5ML PO SYRP: 5.0000 mL | ORAL_SOLUTION | Freq: Four times a day (QID) | ORAL | Status: AC | PRN

## 2011-01-13 NOTE — ED Provider Notes (Signed)
Medical screening examination/treatment/procedure(s) were performed by non-physician practitioner and as supervising physician I was immediately available for consultation/collaboration.    Celene Kras, MD 01/13/11 [phone]

## 2011-01-13 NOTE — ED Provider Notes (Signed)
History     CSN: [id number]  Arrival date & time 01/13/11  4098   First MD Initiated Contact with Patient 01/13/11 [phone]      Chief Complaint  Patient presents with  . Facial Pain  . Nasal Congestion    (Consider location/radiation/quality/duration/timing/severity/associated sxs/prior treatment) HPI Comments: Chronic sinusitis and that this occurrence began about 7 days ago.  She has been using over-the-counter medications including oral decongestants, Nasonex, Flonase, and NSAIDs.  Patient states that she starting to have facial pain from the pressure on her frontal sinuses.  Patient denies any change in her vision, fevers, night sweats, chills.  Patient is a 50 y.o. female presenting with sinusitis. The history is provided by the patient.  Sinusitis  This is a chronic problem. The current episode started more than 1 week ago. The problem has been gradually worsening. There has been no fever. The pain is at a severity of 8/10. The pain has been constant since onset. Associated symptoms include congestion, ear pain and sinus pressure. Pertinent negatives include no chills, no sweats, no hoarse voice, no sore throat, no swollen glands, no cough and no shortness of breath. She has tried aspirin (Decongestants) for the symptoms. The treatment provided no relief.    Past Medical History  Diagnosis Date  . Hypokalemia   . CHF (congestive heart failure)   . Unspecified adverse effect of unspecified drug, medicinal and biological substance   . Otitis media   . External otitis   . Sinusitis   . Plantar fasciitis   . Family history of ischemic heart disease   . Common migraine   . Morbid obesity   . Hyperlipidemia   . OSA (obstructive sleep apnea)   . Carpal tunnel syndrome   . Hypertension   . CAD (coronary artery disease)   . Asthma   . Allergic rhinitis   . Routine general medical examination at a health care facility   . Shoulder and upper arm, insect bite, nonvenomous, without  mention of infection   . Unspecified adverse effect of unspecified drug, medicinal and biological substance   . Acute serous otitis media   . Diabetes mellitus     type 2  . Tubal ligation status   . Old myocardial infarction     Past Surgical History  Procedure Date  . Tubal ligation   . Tonsillectomy   . Uvulopalatopharyngoplasty   . Cesarean section   . Nasal sinus surgery   . Partial hysterectomy     Family History  Problem Relation Age of Onset  . Coronary artery disease      1st degree relatvie <50  . Breast cancer      aunt- ? paternal or maternal  . Asthma Sister   . Heart disease Mother     History  Substance Use Topics  . Smoking status: Never Smoker   . Smokeless tobacco: Never Used  . Alcohol Use: No    OB History    Grav Para Term Preterm Abortions TAB SAB Ect Mult Living                  Review of Systems  Constitutional: Negative for chills.  HENT: Positive for ear pain, congestion and sinus pressure. Negative for sore throat and hoarse voice.   Respiratory: Negative for cough and shortness of breath.   All other systems reviewed and are negative.    Allergies  Doxycycline; Hydrocortisone; Ciprofloxacin; Neomycin; and Sulfonamide derivatives  Home Medications   Current  Outpatient Rx  Name Route Sig Dispense Refill  . ALBUTEROL SULFATE HFA 108 (90 BASE) MCG/ACT IN AERS Inhalation Inhale 2 puffs into the lungs every 6 (six) hours as needed. For shortness of breath.    . CLONIDINE HCL 0.2 MG PO TABS Oral Take 0.2 mg by mouth 2 (two) times daily.      [name] DM-GUAIFENESIN ER 30-600 MG PO TB12 Oral Take 1 tablet by mouth every 12 (twelve) hours as needed. For congestion.     [name] FLUTICASONE PROPIONATE 50 MCG/ACT NA SUSP Nasal Place 2 sprays into the nose daily.     [name] LOSARTAN POTASSIUM-HCTZ 100-25 MG PO TABS Oral Take 1 tablet by mouth every morning.      [name] NAPROXEN SODIUM 220 MG PO TABS Oral Take 440 mg by mouth every 8 (eight) hours as needed. For  headache or pain.     [name] POTASSIUM CHLORIDE CRYS CR 20 MEQ PO TBCR Oral Take 20 mEq by mouth 2 (two) times daily.      . AMOXICILLIN-POT CLAVULANATE 875-125 MG PO TABS Oral Take 1 tablet by mouth every 12 (twelve) hours. 24 tablet 0  . HYDROCODONE-HOMATROPINE 5-1.5 MG/5ML PO SYRP Oral Take 5 mLs by mouth every 6 (six) hours as needed for cough or pain. 120 mL 0    BP 156/78  Pulse 64  Temp(Src) 97.9 F (36.6 C) (Oral)  Resp 18  SpO2 99%  Physical Exam  Constitutional: She is oriented to person, place, and time. She appears well-nourished. No distress.  HENT:  Head: Normocephalic and atraumatic. No trismus in the jaw.  Right Ear: External ear normal. No drainage or tenderness. No mastoid tenderness.  Left Ear: External ear normal. No drainage or tenderness. No mastoid tenderness.  Nose: Rhinorrhea present. No sinus tenderness, nasal deformity or nasal septal hematoma. Right sinus exhibits maxillary sinus tenderness and frontal sinus tenderness. Left sinus exhibits maxillary sinus tenderness and frontal sinus tenderness.  Mouth/Throat: Uvula is midline, oropharynx is clear and moist and mucous membranes are normal. No uvula swelling. No oropharyngeal exudate.  Eyes: Conjunctivae and EOM are normal. Right eye exhibits no discharge. Left eye exhibits no discharge. No scleral icterus.  Neck: Normal range of motion. Neck supple.  Cardiovascular: Normal rate, regular rhythm and normal heart sounds.   Pulmonary/Chest: Effort normal and breath sounds normal. No stridor. No respiratory distress. She has no wheezes. She exhibits no tenderness.  Abdominal: Soft. There is no tenderness.  Musculoskeletal: Normal range of motion.  Neurological: She is alert and oriented to person, place, and time.  Skin: Skin is warm and dry. No rash noted. She is not diaphoretic.  Psychiatric: She has a normal mood and affect. Her behavior is normal.    ED Course  Procedures (including critical care time)  Labs  Reviewed - No data to display No results found.   1. Sinusitis     Patient being treated with Augmentin for her sinusitis.  She's been given instructions to followup with her primary care Dr.doctor.  Sinusitis         [place], [place] 01/13/11 1448

## 2011-01-13 NOTE — ED Notes (Signed)
Pt complains of sinus pressure and congestion for one week

## 2011-01-21 ENCOUNTER — Ambulatory Visit (HOSPITAL_COMMUNITY): Payer: No Typology Code available for payment source | Attending: Cardiology | Admitting: Radiology

## 2011-01-21 ENCOUNTER — Other Ambulatory Visit (INDEPENDENT_AMBULATORY_CARE_PROVIDER_SITE_OTHER): Payer: No Typology Code available for payment source | Admitting: *Deleted

## 2011-01-21 DIAGNOSIS — I10 Essential (primary) hypertension: Secondary | ICD-10-CM

## 2011-01-21 DIAGNOSIS — E876 Hypokalemia: Secondary | ICD-10-CM

## 2011-01-21 DIAGNOSIS — E785 Hyperlipidemia, unspecified: Secondary | ICD-10-CM | POA: Insufficient documentation

## 2011-01-21 DIAGNOSIS — I428 Other cardiomyopathies: Secondary | ICD-10-CM | POA: Insufficient documentation

## 2011-01-21 LAB — BASIC METABOLIC PANEL
CO2: 28 mEq/L (ref 19–32)
Chloride: 104 mEq/L (ref 96–112)
Glucose, Bld: 118 mg/dL — ABNORMAL HIGH (ref 70–99)
Sodium: 140 mEq/L (ref 135–145)

## 2011-01-24 ENCOUNTER — Telehealth: Payer: Self-pay | Admitting: Cardiology

## 2011-01-24 NOTE — Telephone Encounter (Signed)
Reviewed results of 2 D Echo with pt.  She is aware that Dr Antoine Poche has not reviewed the results at this time.  Pt reports that on 01/21/11 she had chest pain on the left side of her chest under her breast that lasted from 1:30am to 8 pm.  She states the pains came every 2 minutes and they took her breath when it occurred.  She took "hydrocodone liquid" without relieve and later took Aleve that eased her pain.  She reports no further chest pain or SOB since that time. Of note:  Pt is asking about her records and why they state she has had an MI in the past.  I can not find any documentation demonstrating an MI and in fact pt had a cardiac cath in 2001 with trivial CAD noted.  The first mention of MI is 10/31/2006 in a note from Dr Oliver Barre.  I can not find anything in the hospital records supporting a history of MI.  Will review with Dr Antoine Poche.

## 2011-01-24 NOTE — Telephone Encounter (Signed)
New Msg: pt calling wanting to know results of pt ECHO. Pt also wanted to discuss some things that stated pt were present in pt medical record that pt wasn't aware of. Please return pt call to discuss further.

## 2011-01-28 NOTE — Telephone Encounter (Signed)
Medical records aware and they will look into the dictations being corrected.

## 2011-01-28 NOTE — Telephone Encounter (Signed)
Pt aware that there is no history of MI and that I will contact medical records to see what can be done about correcting this in her chart.  She was instructed to follow up with PCP for her non-cardiac chest discomfort.

## 2011-01-28 NOTE — Telephone Encounter (Signed)
No history of an MI.  Echo and labs reviewed.  Follow with primary MD for any chest pain as described.

## 2011-02-03 ENCOUNTER — Emergency Department (HOSPITAL_COMMUNITY)
Admission: EM | Admit: 2011-02-03 | Discharge: 2011-02-03 | Disposition: A | Discharge status: Home / Self Care | Payer: No Typology Code available for payment source | Attending: Emergency Medicine | Admitting: Emergency Medicine

## 2011-02-03 ENCOUNTER — Encounter (HOSPITAL_COMMUNITY): Payer: Self-pay | Admitting: Adult Health

## 2011-02-03 ENCOUNTER — Emergency Department (HOSPITAL_COMMUNITY): Payer: No Typology Code available for payment source

## 2011-02-03 ENCOUNTER — Other Ambulatory Visit: Payer: Self-pay

## 2011-02-03 DIAGNOSIS — R600 Localized edema: Secondary | ICD-10-CM

## 2011-02-03 DIAGNOSIS — Z79899 Other long term (current) drug therapy: Secondary | ICD-10-CM | POA: Insufficient documentation

## 2011-02-03 DIAGNOSIS — I2510 Atherosclerotic heart disease of native coronary artery without angina pectoris: Secondary | ICD-10-CM | POA: Insufficient documentation

## 2011-02-03 DIAGNOSIS — R059 Cough, unspecified: Secondary | ICD-10-CM | POA: Insufficient documentation

## 2011-02-03 DIAGNOSIS — E876 Hypokalemia: Secondary | ICD-10-CM

## 2011-02-03 DIAGNOSIS — I252 Old myocardial infarction: Secondary | ICD-10-CM | POA: Insufficient documentation

## 2011-02-03 DIAGNOSIS — I10 Essential (primary) hypertension: Secondary | ICD-10-CM | POA: Insufficient documentation

## 2011-02-03 DIAGNOSIS — E119 Type 2 diabetes mellitus without complications: Secondary | ICD-10-CM | POA: Insufficient documentation

## 2011-02-03 DIAGNOSIS — R609 Edema, unspecified: Secondary | ICD-10-CM | POA: Insufficient documentation

## 2011-02-03 DIAGNOSIS — E785 Hyperlipidemia, unspecified: Secondary | ICD-10-CM | POA: Insufficient documentation

## 2011-02-03 DIAGNOSIS — R0602 Shortness of breath: Secondary | ICD-10-CM | POA: Insufficient documentation

## 2011-02-03 DIAGNOSIS — M7989 Other specified soft tissue disorders: Secondary | ICD-10-CM | POA: Insufficient documentation

## 2011-02-03 DIAGNOSIS — R05 Cough: Secondary | ICD-10-CM | POA: Insufficient documentation

## 2011-02-03 DIAGNOSIS — I509 Heart failure, unspecified: Secondary | ICD-10-CM | POA: Insufficient documentation

## 2011-02-03 LAB — CBC
Hemoglobin: 12.9 g/dL (ref 12.0–15.0)
MCH: 30.1 pg (ref 26.0–34.0)
MCV: 90 fL (ref 78.0–100.0)
RBC: 4.29 MIL/uL (ref 3.87–5.11)

## 2011-02-03 LAB — POCT I-STAT TROPONIN I

## 2011-02-03 LAB — BASIC METABOLIC PANEL
BUN: 11 mg/dL (ref 6–23)
Calcium: 9.3 mg/dL (ref 8.4–10.5)
Creatinine, Ser: 0.5 mg/dL (ref 0.50–1.10)
GFR calc non Af Amer: >90 mL/min (ref >90)
Glucose, Bld: 97 mg/dL (ref 70–99)
Potassium: 3 mEq/L — ABNORMAL LOW (ref 3.5–5.1)

## 2011-02-03 LAB — DIFFERENTIAL
Eosinophils Absolute: 0.3 10*3/uL (ref 0.0–0.7)
Eosinophils Relative: 3 % (ref 0–5)
Lymphs Abs: 2.8 10*3/uL (ref 0.7–4.0)
Monocytes Absolute: 0.5 10*3/uL (ref 0.1–1.0)
Monocytes Relative: 5 % (ref 3–12)

## 2011-02-03 MED ORDER — IOHEXOL 300 MG/ML  SOLN
100.0000 mL | Freq: Once | INTRAMUSCULAR | Status: AC | PRN
  Administered 2011-02-03: 100 mL via INTRAVENOUS

## 2011-02-03 MED ORDER — POTASSIUM CHLORIDE 10 MEQ/100ML IV SOLN
10.0000 meq | Freq: Once | INTRAVENOUS | Status: AC
  Administered 2011-02-03: 10 meq via INTRAVENOUS
  Filled 2011-02-03: qty 100

## 2011-02-03 NOTE — ED Notes (Signed)
Pt ambulatory to BR without assistance.  

## 2011-02-03 NOTE — ED Provider Notes (Signed)
History     CSN: [id number]  Arrival date & time 02/03/11  1213   First MD Initiated Contact with Patient 02/03/11 1421      Chief Complaint  Patient presents with  . Cough    (Consider location/radiation/quality/duration/timing/severity/associated sxs/prior treatment) HPI Comments: Patient reports that she has been increasingly short of breath for the past week.  She states that she becomes short of breath with exertion.  She reports that she flew to [place] one week ago and returned earlier today.  She denies any chest pain.  She denies PND, orthopnea, or palpitations.  She also reports that she has had a productive cough for the past 5 days, which is becoming progressively worse.  She has had bilateral swelling of lower extremities for the past week also.  She currently does not smoke.   She has a history of MI and CHF documented in the chart.  However, she had a recent Echocardiogram done on 01/21/11 which showed a EF of 65-70%.  Her PCP is Dr. Alwyn Ren.  She also sees Dr. Caprice Red with [facility].  Patient is not on any hormone replacement therapy, no recent surgery, no prior history of DVT/PE.  Patient is a 51 y.o. female presenting with cough. The history is provided by the patient.  Cough This is a new problem. Episode onset: 5 days ago. The cough is productive of sputum. There has been no fever. Associated symptoms include shortness of breath. Pertinent negatives include no chest pain, no chills and no wheezing.    Past Medical History  Diagnosis Date  . Hypokalemia   . CHF (congestive heart failure)   . Unspecified adverse effect of unspecified drug, medicinal and biological substance   . Otitis media   . External otitis   . Sinusitis   . Plantar fasciitis   . Family history of ischemic heart disease   . Common migraine   . Morbid obesity   . Hyperlipidemia   . OSA (obstructive sleep apnea)   . Carpal tunnel syndrome   . Hypertension   . CAD (coronary artery  disease)   . Asthma   . Allergic rhinitis   . Routine general medical examination at a health care facility   . Shoulder and upper arm, insect bite, nonvenomous, without mention of infection   . Unspecified adverse effect of unspecified drug, medicinal and biological substance   . Acute serous otitis media   . Diabetes mellitus     type 2  . Tubal ligation status   . Old myocardial infarction     Past Surgical History  Procedure Date  . Tubal ligation   . Tonsillectomy   . Uvulopalatopharyngoplasty   . Cesarean section   . Nasal sinus surgery   . Partial hysterectomy     Family History  Problem Relation Age of Onset  . Coronary artery disease      1st degree relatvie <50  . Breast cancer      aunt- ? paternal or maternal  . Asthma Sister   . Heart disease Mother     History  Substance Use Topics  . Smoking status: Never Smoker   . Smokeless tobacco: Never Used  . Alcohol Use: No    OB History    Grav Para Term Preterm Abortions TAB SAB Ect Mult Living                  Review of Systems  Constitutional: Negative for fever and chills.  Respiratory:  Positive for cough and shortness of breath. Negative for wheezing.   Cardiovascular: Positive for leg swelling. Negative for chest pain.  Gastrointestinal: Negative for nausea and vomiting.  Neurological: Negative for dizziness, syncope and light-headedness.    Allergies  Doxycycline; Hydrocortisone; Ciprofloxacin; Neomycin; and Sulfonamide derivatives  Home Medications   Current Outpatient Rx  Name Route Sig Dispense Refill  . ALBUTEROL SULFATE HFA 108 (90 BASE) MCG/ACT IN AERS Inhalation Inhale 2 puffs into the lungs every 6 (six) hours as needed. For shortness of breath.    . CLONIDINE HCL 0.2 MG PO TABS Oral Take 0.2 mg by mouth 2 (two) times daily.      [name] DM-GUAIFENESIN ER 30-600 MG PO TB12 Oral Take 1 tablet by mouth every 12 (twelve) hours as needed. For congestion.    [name] FLUTICASONE PROPIONATE 50 MCG/ACT  NA SUSP Nasal Place 2 sprays into the nose daily.     [name] LOSARTAN POTASSIUM-HCTZ 100-25 MG PO TABS Oral Take 1 tablet by mouth every morning.      [name] NAPROXEN SODIUM 220 MG PO TABS Oral Take 440 mg by mouth every 8 (eight) hours as needed. For headache or pain.     [name] POTASSIUM CHLORIDE CRYS ER 20 MEQ PO TBCR Oral Take 20 mEq by mouth 2 (two) times daily.        BP 144/80  Pulse 99  Temp(Src) 99.1 F (37.3 C) (Oral)  Resp 20  SpO2 94%  Physical Exam  Nursing note and vitals reviewed. Constitutional: She is oriented to person, place, and time. She appears well-developed and well-nourished. No distress.  HENT:  Head: Normocephalic and atraumatic.  Right Ear: Tympanic membrane normal.  Left Ear: Tympanic membrane normal.  Nose: Nose normal.  Mouth/Throat: Uvula is midline, oropharynx is clear and moist and mucous membranes are normal.  Cardiovascular: Normal rate, regular rhythm and normal heart sounds.        1+ pitting edema bilaterally  Pulmonary/Chest: Effort normal and breath sounds normal. No respiratory distress. She has no wheezes.  Abdominal: Soft.  Neurological: She is alert and oriented to person, place, and time.  Skin: Skin is warm and dry. She is not diaphoretic.  Psychiatric: She has a normal mood and affect.    ED Course  Procedures (including critical care time)  Labs Reviewed - No data to display Dg Chest 2 View  02/03/2011  *RADIOLOGY REPORT*  Clinical Data: Congestion.  Morbid obesity.  CHEST - 2 VIEW  Comparison: 10/26/2010  Findings: Lateral view degraded by patient arm position.  Mild lower thoracic spondylosis. Midline trachea.  Mild cardiomegaly. No pleural effusion or pneumothorax.  Mild interstitial thickening is chronic. Clear lungs.  IMPRESSION: Cardiomegaly with chronic interstitial thickening.  Cannot exclude mild pulmonary venous congestion.  No evidence of pneumonia.  Original Report Authenticated By: Consuello Bossier, M.D.     No diagnosis  found.  Patient signed out to Lorenz Coaster, PA-C who assumes care of patient.  Patient awaiting CT angiogram of her chest.  MDM  Patient with recent travel and increasing shortness of breath.  Therefore, feel that she is at risk for PE.  Do not feel that patient can be adequately screened with d-dimer.  Therefore, CT angiogram was ordered to rule out PE.        Pascal Lux [facility] 02/03/11 1614

## 2011-02-03 NOTE — ED Notes (Signed)
MD at bedside. 

## 2011-02-03 NOTE — ED Notes (Signed)
Reports coughing up brown mucus since Wednesday, denies chest pain  And SOB.

## 2011-02-03 NOTE — ED Provider Notes (Signed)
4:30 PM Pt care assumed by myself at shift change. She is in the emergency department for evaluation of cough (new), progressive exertional dyspnea (new), and lower extremity edema (of which she has a hx). Recent prolonged air travel. Labs indicate hypokalemia and supplementation is ordered. She is awaiting a CT angiogram of the chest r/o PE as she is not a candidate for D-dimer r/o. On my assessment, she is resting comfortably in wheelchair, NAD. Heart RRR. Lungs CTAB. Abd soft, NT. 1+ pitting BLE edema. MAEW. Speech clear.   5:00 PM  Date: 02/03/2011  Rate: 69  Rhythm: sinus  QRS Axis: normal  Intervals: normal  ST/T Wave abnormalities: normal  Conduction Disutrbances:none  Narrative Interpretation: Anterior q-waves present on prior ECG  Old EKG Reviewed: unchanged    6:56 PM No pulmonary embolus identified on CTA. Will check pulseOx on ambulation and re-assess.    7:55 PM Pt with 100% O2 saturation on room air while ambulating, reports to me that she did not experience dyspnea on ambulation and overall feels better than when she came in to the emergency department. She is in no distress and appears well. Denies any current CP, SOB, N/V, chills. WIll be d/c home with instructions to follow-up closely with her primary doctor for re-evaluation. Advised to return to ED for any concerning symptom change/worsening.  [address] [place], [place] 02/03/11 [phone]

## 2011-02-03 NOTE — ED Provider Notes (Signed)
She is seen examined. Patient notes recent travel history as well as dyspnea on exertion. Concern for pulmonary embolism, patient to have chest CT to rule out pulmonary embolism  Toy Baker, MD 02/03/11 1520

## 2011-02-06 NOTE — ED Provider Notes (Signed)
Medical screening examination/treatment/procedure(s) were performed by non-physician practitioner and as supervising physician I was immediately available for consultation/collaboration.  Toy Baker, MD 02/06/11 [phone]

## 2011-02-06 NOTE — ED Provider Notes (Signed)
Medical screening examination/treatment/procedure(s) were performed by non-physician practitioner and as supervising physician I was immediately available for consultation/collaboration.  Lilymae Swiech T Saleen Peden, MD 02/06/11 1658 

## 2011-02-20 ENCOUNTER — Telehealth: Payer: Self-pay | Admitting: Cardiology

## 2011-02-20 NOTE — Telephone Encounter (Addendum)
New problem Pt said she needs rx for lasix called to [facility] please let her know

## 2011-02-20 NOTE — Telephone Encounter (Signed)
Per pt - she is having edema in feet and ankles bilaterally.  Has had it for about one week.  She denies any other symptoms.  She is requesting Furosemide 40 mg to be called in to her pharmacy.  I do not see this on her medication list however she reports she takes it as needed.  Will review with Dr Antoine Poche and call patient back with new orders.

## 2011-02-22 MED ORDER — FUROSEMIDE 40 MG PO TABS: ORAL_TABLET | ORAL | Status: DC

## 2011-02-22 NOTE — Telephone Encounter (Signed)
Pt aware of orders

## 2011-02-22 NOTE — Telephone Encounter (Signed)
OK to take Lasix 40 mg daily prn swelling.  Disp number 30.  3 refills. She should take 10 meq of KCL po daily with Lasix.  Disp number 3 with 3 refills.  If she takes this more than 3 days she needs to come in for a BMET.

## 2011-04-07 ENCOUNTER — Emergency Department (HOSPITAL_COMMUNITY)
Admission: EM | Admit: 2011-04-07 | Discharge: 2011-04-07 | Disposition: A | Discharge status: Home / Self Care | Payer: No Typology Code available for payment source | Attending: Emergency Medicine | Admitting: Emergency Medicine

## 2011-04-07 ENCOUNTER — Encounter (HOSPITAL_COMMUNITY): Payer: Self-pay

## 2011-04-07 DIAGNOSIS — I2510 Atherosclerotic heart disease of native coronary artery without angina pectoris: Secondary | ICD-10-CM | POA: Insufficient documentation

## 2011-04-07 DIAGNOSIS — Z881 Allergy status to other antibiotic agents status: Secondary | ICD-10-CM | POA: Insufficient documentation

## 2011-04-07 DIAGNOSIS — I10 Essential (primary) hypertension: Secondary | ICD-10-CM | POA: Insufficient documentation

## 2011-04-07 DIAGNOSIS — J45909 Unspecified asthma, uncomplicated: Secondary | ICD-10-CM | POA: Insufficient documentation

## 2011-04-07 DIAGNOSIS — J329 Chronic sinusitis, unspecified: Secondary | ICD-10-CM

## 2011-04-07 DIAGNOSIS — I252 Old myocardial infarction: Secondary | ICD-10-CM | POA: Insufficient documentation

## 2011-04-07 DIAGNOSIS — E119 Type 2 diabetes mellitus without complications: Secondary | ICD-10-CM | POA: Insufficient documentation

## 2011-04-07 DIAGNOSIS — G4733 Obstructive sleep apnea (adult) (pediatric): Secondary | ICD-10-CM | POA: Insufficient documentation

## 2011-04-07 DIAGNOSIS — Z836 Family history of other diseases of the respiratory system: Secondary | ICD-10-CM | POA: Insufficient documentation

## 2011-04-07 DIAGNOSIS — I509 Heart failure, unspecified: Secondary | ICD-10-CM | POA: Insufficient documentation

## 2011-04-07 DIAGNOSIS — Z882 Allergy status to sulfonamides status: Secondary | ICD-10-CM | POA: Insufficient documentation

## 2011-04-07 DIAGNOSIS — J309 Allergic rhinitis, unspecified: Secondary | ICD-10-CM | POA: Insufficient documentation

## 2011-04-07 DIAGNOSIS — Z8249 Family history of ischemic heart disease and other diseases of the circulatory system: Secondary | ICD-10-CM | POA: Insufficient documentation

## 2011-04-07 DIAGNOSIS — Z809 Family history of malignant neoplasm, unspecified: Secondary | ICD-10-CM | POA: Insufficient documentation

## 2011-04-07 DIAGNOSIS — E785 Hyperlipidemia, unspecified: Secondary | ICD-10-CM | POA: Insufficient documentation

## 2011-04-07 MED ORDER — AMOXICILLIN-POT CLAVULANATE 875-125 MG PO TABS: 1.0000 | ORAL_TABLET | Freq: Two times a day (BID) | ORAL | Status: AC

## 2011-04-07 NOTE — Discharge Instructions (Signed)

## 2011-04-07 NOTE — ED Notes (Signed)
Patient here with cough and congestion x 2 weeks. Reports frontal headache and sinus drainage for same. Taking zyrtec and flonase w/o relief

## 2011-04-07 NOTE — ED Provider Notes (Signed)
History     CSN: [id number]  Arrival date & time 04/07/11  5621   First MD Initiated Contact with Patient 04/07/11 [phone]      Chief Complaint  Patient presents with  . Nasal Congestion    (Consider location/radiation/quality/duration/timing/severity/associated sxs/prior treatment) HPI Comments: To me, the patient reports that she has a long-standing history of sinus problems has been using Allegra, Flonase, saline spray 4 proximally 2 weeks or slightly longer. Patient reports that she has a frontal headache, but no blurred vision, visual loss. She's had some subjective fevers. She reports that she feels like she is having postnasal drip and some drainage and she reports that she has a lot of swelling around her left posterior lower jaw and cheek. She denies any specific dental pain. She feels like her ears are muffled but no hearing loss. She denies a stiff neck or rash. She reports in the past, when she has a sinus infection she was treated with Augmentin for 2-3 weeks. She is not seen her primary care physician or ENT physician for her symptoms. She denies any chest pain or shortness of breath to me. She did tell the nurse that she's had some coughing as well. Patient has had a history of bronchitis in the past and uses a inhaler at home as needed. She reports that she has not required use of the inhaler recently.  The history is provided by the patient.    Past Medical History  Diagnosis Date  . Hypokalemia   . CHF (congestive heart failure)   . Unspecified adverse effect of unspecified drug, medicinal and biological substance   . Otitis media   . External otitis   . Sinusitis   . Plantar fasciitis   . Family history of ischemic heart disease   . Common migraine   . Morbid obesity   . Hyperlipidemia   . OSA (obstructive sleep apnea)   . Carpal tunnel syndrome   . Hypertension   . CAD (coronary artery disease)   . Asthma   . Allergic rhinitis   . Routine general medical  examination at a health care facility   . Shoulder and upper arm, insect bite, nonvenomous, without mention of infection   . Unspecified adverse effect of unspecified drug, medicinal and biological substance   . Acute serous otitis media   . Diabetes mellitus     type 2  . Tubal ligation status   . Old myocardial infarction     Past Surgical History  Procedure Date  . Tubal ligation   . Tonsillectomy   . Uvulopalatopharyngoplasty   . Cesarean section   . Nasal sinus surgery   . Partial hysterectomy     Family History  Problem Relation Age of Onset  . Coronary artery disease      1st degree relatvie <50  . Breast cancer      aunt- ? paternal or maternal  . Asthma Sister   . Heart disease Mother     History  Substance Use Topics  . Smoking status: Never Smoker   . Smokeless tobacco: Never Used  . Alcohol Use: No    OB History    Grav Para Term Preterm Abortions TAB SAB Ect Mult Living                  Review of Systems  Constitutional: Positive for fever. Negative for chills.  HENT: Positive for congestion and sinus pressure. Negative for neck pain, neck stiffness and dental problem.  Eyes: Negative for photophobia, pain and visual disturbance.  Respiratory: Positive for cough. Negative for chest tightness and shortness of breath.   Cardiovascular: Negative for chest pain.  Gastrointestinal: Negative for nausea and vomiting.  Neurological: Positive for headaches. Negative for weakness and numbness.    Allergies  Doxycycline; Hydrocortisone; Ciprofloxacin; Neomycin; and Sulfonamide derivatives  Home Medications   Current Outpatient Rx  Name Route Sig Dispense Refill  . ALBUTEROL SULFATE HFA 108 (90 BASE) MCG/ACT IN AERS Inhalation Inhale 2 puffs into the lungs every 6 (six) hours as needed. For shortness of breath.    . CLONIDINE HCL 0.2 MG PO TABS Oral Take 0.2 mg by mouth 2 (two) times daily.      [name] FEXOFENADINE-PSEUDOEPHED ER 180-240 MG PO TB24 Oral Take  1 tablet by mouth every morning.    [name] FLUTICASONE PROPIONATE 50 MCG/ACT NA SUSP Nasal Place 2 sprays into the nose daily.     . FUROSEMIDE 40 MG PO TABS Oral Take 40 mg by mouth daily as needed. For swelling or fluid retention.    . GUAIFENESIN ER 600 MG PO TB12 Oral Take 600 mg by mouth every evening.    . IBUPROFEN 200 MG PO TABS Oral Take 800 mg by mouth every 6 (six) hours as needed. For headache.    [name] LOSARTAN POTASSIUM-HCTZ 100-25 MG PO TABS Oral Take 1 tablet by mouth every morning.      [name] OXYMETAZOLINE HCL 0.05 % NA SOLN Nasal Place 2 sprays into the nose 2 (two) times daily as needed. For nasal congestion.    [name] POTASSIUM CHLORIDE CRYS ER 20 MEQ PO TBCR Oral Take 40 mEq by mouth daily.     . AMOXICILLIN-POT CLAVULANATE 875-125 MG PO TABS Oral Take 1 tablet by mouth every 12 (twelve) hours. 28 tablet 0    BP 186/108  Pulse 74  Temp(Src) 98.7 F (37.1 C) (Oral)  Resp 20  Ht 5\' 4"  (1.626 m)  SpO2 95%  Physical Exam  Vitals reviewed. Constitutional: She appears well-developed and well-nourished. No distress.  HENT:  Head: Normocephalic and atraumatic.  Right Ear: Hearing and external ear normal.  Left Ear: Hearing and tympanic membrane normal.  Nose: No mucosal edema or rhinorrhea. Right sinus exhibits frontal sinus tenderness. Right sinus exhibits no maxillary sinus tenderness. Left sinus exhibits maxillary sinus tenderness and frontal sinus tenderness.  Mouth/Throat: Uvula is midline, oropharynx is clear and moist and mucous membranes are normal.  Eyes: Conjunctivae and EOM are normal. Pupils are equal, round, and reactive to light. No scleral icterus.    ED Course  Procedures (including critical care time)  Labs Reviewed - No data to display No results found.   1. Sinusitis       MDM  HTN, but no evidence of end organ failuer.  PT with sinus infectious symptoms, HA, chills and sinus pressure for >2 weeks, will start on oral abx and refer her back to her ENT for  follow up.         Gavin Pound. Keelen Quevedo, MD 04/11/11 1653

## 2011-05-09 ENCOUNTER — Encounter (HOSPITAL_COMMUNITY): Payer: Self-pay | Admitting: Emergency Medicine

## 2011-05-09 ENCOUNTER — Emergency Department (HOSPITAL_COMMUNITY)
Admission: EM | Admit: 2011-05-09 | Discharge: 2011-05-09 | Disposition: A | Discharge status: Home / Self Care | Payer: No Typology Code available for payment source | Attending: Emergency Medicine | Admitting: Emergency Medicine

## 2011-05-09 DIAGNOSIS — I252 Old myocardial infarction: Secondary | ICD-10-CM | POA: Insufficient documentation

## 2011-05-09 DIAGNOSIS — I509 Heart failure, unspecified: Secondary | ICD-10-CM | POA: Insufficient documentation

## 2011-05-09 DIAGNOSIS — E785 Hyperlipidemia, unspecified: Secondary | ICD-10-CM | POA: Insufficient documentation

## 2011-05-09 DIAGNOSIS — I2510 Atherosclerotic heart disease of native coronary artery without angina pectoris: Secondary | ICD-10-CM | POA: Insufficient documentation

## 2011-05-09 DIAGNOSIS — J45909 Unspecified asthma, uncomplicated: Secondary | ICD-10-CM | POA: Insufficient documentation

## 2011-05-09 DIAGNOSIS — J3489 Other specified disorders of nose and nasal sinuses: Secondary | ICD-10-CM | POA: Insufficient documentation

## 2011-05-09 DIAGNOSIS — R0981 Nasal congestion: Secondary | ICD-10-CM

## 2011-05-09 DIAGNOSIS — R0982 Postnasal drip: Secondary | ICD-10-CM | POA: Insufficient documentation

## 2011-05-09 MED ORDER — AMOXICILLIN-POT CLAVULANATE 875-125 MG PO TABS
1.0000 | ORAL_TABLET | Freq: Once | ORAL | Status: AC
  Administered 2011-05-09: 1 via ORAL
  Filled 2011-05-09: qty 1

## 2011-05-09 MED ORDER — AMOXICILLIN-POT CLAVULANATE 875-125 MG PO TABS: 1.0000 | ORAL_TABLET | Freq: Two times a day (BID) | ORAL | Status: AC

## 2011-05-09 MED ORDER — PREDNISONE 20 MG PO TABS: 60.0000 mg | ORAL_TABLET | Freq: Every day | ORAL | Status: AC

## 2011-05-09 MED ORDER — PREDNISONE 20 MG PO TABS
60.0000 mg | ORAL_TABLET | ORAL | Status: AC
  Administered 2011-05-09: 60 mg via ORAL
  Filled 2011-05-09: qty 3

## 2011-05-09 NOTE — ED Notes (Signed)
Pt. C/o of congestion x 2 weeks. Reports coughing up yellow phlegm with sore throat and pressure on the left side of her face. States pain is an 8. Pt. States she has been taking Claritin x 2 weeks with no relief. Has also been using Flonase and Mucinex. Denies sob or trouble breathing. Lung sounds clear.

## 2011-05-09 NOTE — Discharge Instructions (Signed)
As we discussed, it is very important to continue having your condition managed by your primary care physician.  Please make sure to take all medication as directed.  Please return to emergency department for any concerning changes in your condition, such as new difficulty breathing difficulty swallowing, chest pain or any other concerning changes.  If your condition is not improved in one week, please be sure to follow up with your specialist, as discussed.

## 2011-05-09 NOTE — ED Provider Notes (Signed)
History     CSN: [id number]  Arrival date & time 05/09/11  4098   First MD Initiated Contact with Patient 05/09/11 [phone]      Chief Complaint  Patient presents with  . Sinusitis    HPI The patient presents with persistent concerns of facial discomfort, rhinorrhea, postnasal drip.  She notes a years long history of sinus issues, for which is taking multiple medication, seen a specialist, and was recently started on Claritin, Flonase, Mucinex.  She has now taken these meds for two weeks with no relief.  She notes that her discomfort is primarily about the superior face and left maxilla.  She denies any new fevers, chills, vomiting, dyspnea, chest pain.   Past Medical History  Diagnosis Date  . Hypokalemia   . CHF (congestive heart failure)   . Unspecified adverse effect of unspecified drug, medicinal and biological substance   . Otitis media   . External otitis   . Sinusitis   . Plantar fasciitis   . Family history of ischemic heart disease   . Common migraine   . Morbid obesity   . Hyperlipidemia   . OSA (obstructive sleep apnea)   . Carpal tunnel syndrome   . Hypertension   . CAD (coronary artery disease)   . Asthma   . Allergic rhinitis   . Routine general medical examination at a health care facility   . Shoulder and upper arm, insect bite, nonvenomous, without mention of infection   . Unspecified adverse effect of unspecified drug, medicinal and biological substance   . Acute serous otitis media   . Tubal ligation status   . Old myocardial infarction     Past Surgical History  Procedure Date  . Tubal ligation   . Tonsillectomy   . Uvulopalatopharyngoplasty   . Cesarean section   . Nasal sinus surgery   . Partial hysterectomy     Family History  Problem Relation Age of Onset  . Coronary artery disease      1st degree relatvie <50  . Breast cancer      aunt- ? paternal or maternal  . Asthma Sister   . Heart disease Mother     History  Substance Use  Topics  . Smoking status: Never Smoker   . Smokeless tobacco: Never Used  . Alcohol Use: No    OB History    Grav Para Term Preterm Abortions TAB SAB Ect Mult Living                  Review of Systems  Constitutional:       HPI  HENT:       HPI otherwise negative  Eyes: Negative.   Respiratory:       HPI, otherwise negative  Cardiovascular:       HPI, otherwise nmegative  Gastrointestinal: Negative for vomiting.  Genitourinary:       HPI, otherwise negative  Musculoskeletal:       HPI, otherwise negative  Skin: Negative.   Neurological: Negative for syncope.    Allergies  Doxycycline; Hydrocortisone; Ciprofloxacin; Neomycin; and Sulfonamide derivatives  Home Medications   Current Outpatient Rx  Name Route Sig Dispense Refill  . ALBUTEROL SULFATE HFA 108 (90 BASE) MCG/ACT IN AERS Inhalation Inhale 2 puffs into the lungs every 6 (six) hours as needed. For shortness of breath.    . AMOXICILLIN-POT CLAVULANATE 875-125 MG PO TABS Oral Take 1 tablet by mouth 2 (two) times daily. 14 tablet 0  .  CLONIDINE HCL 0.2 MG PO TABS Oral Take 0.2 mg by mouth 2 (two) times daily.      [name] FEXOFENADINE-PSEUDOEPHED ER 180-240 MG PO TB24 Oral Take 1 tablet by mouth every morning.    [name] FLUTICASONE PROPIONATE 50 MCG/ACT NA SUSP Nasal Place 2 sprays into the nose daily.     . FUROSEMIDE 40 MG PO TABS Oral Take 40 mg by mouth daily as needed. For swelling or fluid retention.    . GUAIFENESIN ER 600 MG PO TB12 Oral Take 600 mg by mouth every evening.    . IBUPROFEN 200 MG PO TABS Oral Take 800 mg by mouth every 6 (six) hours as needed. For headache.    [name] LOSARTAN POTASSIUM-HCTZ 100-25 MG PO TABS Oral Take 1 tablet by mouth every morning.      [name] OXYMETAZOLINE HCL 0.05 % NA SOLN Nasal Place 2 sprays into the nose 2 (two) times daily as needed. For nasal congestion.    [name] POTASSIUM CHLORIDE CRYS ER 20 MEQ PO TBCR Oral Take 40 mEq by mouth daily.     [name] PREDNISONE 20 MG PO TABS Oral Take 3 tablets (60  mg total) by mouth daily. 18 tablet 0    BP 120/52  Pulse 72  Temp 97.9 F (36.6 C)  Resp 22  SpO2 98%  Physical Exam  Vitals reviewed. Constitutional: She is oriented to person, place, and time. She appears well-developed and well-nourished. No distress.  HENT:  Head: Normocephalic and atraumatic.  Right Ear: Hearing and external ear normal.  Left Ear: Hearing normal. No drainage, swelling or tenderness. No mastoid tenderness. Tympanic membrane is not injected, not erythematous, not retracted and not bulging. A middle ear effusion is present. No hemotympanum.  Nose: No mucosal edema or rhinorrhea. Right sinus exhibits frontal sinus tenderness. Right sinus exhibits no maxillary sinus tenderness. Left sinus exhibits maxillary sinus tenderness and frontal sinus tenderness.  Mouth/Throat: Uvula is midline, oropharynx is clear and moist and mucous membranes are normal.  Eyes: Conjunctivae and EOM are normal. Pupils are equal, round, and reactive to light. No scleral icterus.  Neck: Neck supple. No rigidity. No tracheal deviation, no edema and no erythema present. No mass present.  Cardiovascular: Normal rate and regular rhythm.   Pulmonary/Chest: Effort normal. No stridor. No respiratory distress. She has no wheezes. She has no rales.  Lymphadenopathy:    She has no cervical adenopathy.  Neurological: She is alert and oriented to person, place, and time.  Skin: Skin is warm and dry.    ED Course  Procedures (including critical care time)  Labs Reviewed - No data to display No results found.   1. Sinus congestion       MDM  This obese female with chronic sinus issues, now presents with ongoing discomfort.  On exam the patient is in no distress with unremarkable vital signs, mild left middle ear effusion.  The patient has seemingly done a reasonable job of the attempting medical therapy for this acute on chronic complaint.  She requests ABX, although she was counseled on the  prevalence of viral causes of her condition.  Absent any acute findings, abnormal VS, patient description of dyspnea or other life-threatening condition, she was d/c in stable condition to f/u w PMD and ENT (as needed).       Gerhard Munch, MD 05/09/11 [phone]

## 2011-06-21 ENCOUNTER — Other Ambulatory Visit: Payer: Self-pay | Admitting: Cardiology

## 2011-07-01 ENCOUNTER — Telehealth: Payer: Self-pay | Admitting: Cardiology

## 2011-07-01 DIAGNOSIS — M79606 Pain in leg, unspecified: Secondary | ICD-10-CM

## 2011-07-01 DIAGNOSIS — E876 Hypokalemia: Secondary | ICD-10-CM

## 2011-07-01 NOTE — Telephone Encounter (Signed)
New Problem:    Patient called in because her potassium chloride SA (K-DUR,KLOR-CON) 20 MEQ tablet is not working because her legs are still aching.  Please call back.

## 2011-07-01 NOTE — Telephone Encounter (Signed)
Patient states her potassium pills are not working because she  continues to have LE pain. Patient was in the ER with diagnosis of cough, hypokalemia and lower extremities pain on 02/03/11, labs were done then.  Potassium level was 3.0 , level was treated . She was recommended to have labs re checked. Patient has not have blood work since then Patient is taken  potasium 40 meg daily. She said today has taken up to 80 MEQ  because of LE pain. Patient said she know her body and she knows that the potassium medication is not working because she continue having  leg pain . Patient is aware to come for labs tomorrow AM for   BMET. Appointment and orders are in, Patient aware.

## 2011-07-02 ENCOUNTER — Telehealth: Payer: Self-pay | Admitting: Cardiology

## 2011-07-02 ENCOUNTER — Emergency Department (HOSPITAL_COMMUNITY)
Admission: EM | Admit: 2011-07-02 | Discharge: 2011-07-02 | Disposition: A | Discharge status: Home / Self Care | Payer: No Typology Code available for payment source | Attending: Emergency Medicine | Admitting: Emergency Medicine

## 2011-07-02 ENCOUNTER — Encounter (HOSPITAL_COMMUNITY): Payer: Self-pay | Admitting: *Deleted

## 2011-07-02 ENCOUNTER — Other Ambulatory Visit: Payer: No Typology Code available for payment source

## 2011-07-02 DIAGNOSIS — J45909 Unspecified asthma, uncomplicated: Secondary | ICD-10-CM | POA: Insufficient documentation

## 2011-07-02 DIAGNOSIS — I10 Essential (primary) hypertension: Secondary | ICD-10-CM

## 2011-07-02 DIAGNOSIS — H6690 Otitis media, unspecified, unspecified ear: Secondary | ICD-10-CM

## 2011-07-02 DIAGNOSIS — E785 Hyperlipidemia, unspecified: Secondary | ICD-10-CM | POA: Insufficient documentation

## 2011-07-02 DIAGNOSIS — Z79899 Other long term (current) drug therapy: Secondary | ICD-10-CM

## 2011-07-02 DIAGNOSIS — I509 Heart failure, unspecified: Secondary | ICD-10-CM | POA: Insufficient documentation

## 2011-07-02 DIAGNOSIS — G4733 Obstructive sleep apnea (adult) (pediatric): Secondary | ICD-10-CM | POA: Insufficient documentation

## 2011-07-02 DIAGNOSIS — I2510 Atherosclerotic heart disease of native coronary artery without angina pectoris: Secondary | ICD-10-CM | POA: Insufficient documentation

## 2011-07-02 DIAGNOSIS — J329 Chronic sinusitis, unspecified: Secondary | ICD-10-CM

## 2011-07-02 LAB — DIFFERENTIAL
Basophils Relative: 0 % (ref 0–1)
Monocytes Absolute: 0.4 10*3/uL (ref 0.1–1.0)
Monocytes Relative: 5 % (ref 3–12)
Neutro Abs: 4.6 10*3/uL (ref 1.7–7.7)

## 2011-07-02 LAB — CBC
HCT: 37.7 % (ref 36.0–46.0)
Hemoglobin: 12.7 g/dL (ref 12.0–15.0)
MCHC: 33.7 g/dL (ref 30.0–36.0)

## 2011-07-02 LAB — COMPREHENSIVE METABOLIC PANEL
Albumin: 3.2 g/dL — ABNORMAL LOW (ref 3.5–5.2)
BUN: 12 mg/dL (ref 6–23)
CO2: 26 mEq/L (ref 19–32)
Chloride: 100 mEq/L (ref 96–112)
Creatinine, Ser: 0.56 mg/dL (ref 0.50–1.10)
GFR calc Af Amer: >90 mL/min (ref >90)
GFR calc non Af Amer: >90 mL/min (ref >90)
Total Bilirubin: 0.4 mg/dL (ref 0.3–1.2)

## 2011-07-02 LAB — POCT I-STAT, CHEM 8
BUN: 12 mg/dL (ref 6–23)
Calcium, Ion: 1.17 mmol/L (ref 1.12–1.32)
Chloride: 102 mEq/L (ref 96–112)
Glucose, Bld: 111 mg/dL — ABNORMAL HIGH (ref 70–99)

## 2011-07-02 MED ORDER — FEXOFENADINE-PSEUDOEPHED ER 60-120 MG PO TB12: 1.0000 | ORAL_TABLET | Freq: Two times a day (BID) | ORAL | Status: DC

## 2011-07-02 MED ORDER — AMOXICILLIN-POT CLAVULANATE 250-125 MG PO TABS: 1.0000 | ORAL_TABLET | Freq: Two times a day (BID) | ORAL | Status: AC

## 2011-07-02 MED ORDER — FLUTICASONE PROPIONATE 50 MCG/ACT NA SUSP: 2.0000 | Freq: Every day | NASAL | Status: DC

## 2011-07-02 MED ORDER — POTASSIUM CHLORIDE ER 10 MEQ PO CPCR: ORAL_CAPSULE | ORAL | Status: DC

## 2011-07-02 NOTE — Telephone Encounter (Signed)
Will have to address with Dr Antoine Poche and call pt back.  Pt taking KDur 80 meq daily

## 2011-07-02 NOTE — Telephone Encounter (Signed)
Pt went to hospital.

## 2011-07-02 NOTE — ED Notes (Signed)
Pt states she is having nasal congestion and she feels as if her ears are stopped up. Pt states she is having mucus and this is been cont's for 10 days. Pt also c/o leg cramping she states she thinks her potassium maybe high

## 2011-07-02 NOTE — ED Provider Notes (Signed)
History     CSN: [id number]  Arrival date & time 07/02/11  0930   First MD Initiated Contact with Patient 07/02/11 1014      Chief Complaint  Patient presents with  . Nasal Congestion    (Consider location/radiation/quality/duration/timing/severity/associated sxs/prior treatment) HPI Comments: Patient with a history of chronic sinusitis, CHF, hypertension, and asthma presents emergency department with a chief complaint of sinus congestion.  Onset of symptoms was 12 days ago and included nasal congestion, headache, sinus pressure, and bilateral ear pain.  Patient denies any tinnitus, fever, night sweats, chills, change in vision, shortness of breath, cough, hemoptysis, chest pain, wheezing.  In addition patient states that she has some bilateral upper thigh cramping.  She states this happens when her potassium is to elevated.  Patient has no other complaints this time.  The history is provided by the patient.    Past Medical History  Diagnosis Date  . Hypokalemia   . CHF (congestive heart failure)   . Unspecified adverse effect of unspecified drug, medicinal and biological substance   . Otitis media   . External otitis   . Sinusitis   . Plantar fasciitis   . Family history of ischemic heart disease   . Common migraine   . Morbid obesity   . Hyperlipidemia   . OSA (obstructive sleep apnea)   . Carpal tunnel syndrome   . Hypertension   . CAD (coronary artery disease)   . Asthma   . Allergic rhinitis   . Routine general medical examination at a health care facility   . Shoulder and upper arm, insect bite, nonvenomous, without mention of infection   . Unspecified adverse effect of unspecified drug, medicinal and biological substance   . Acute serous otitis media   . Tubal ligation status   . Old myocardial infarction     Past Surgical History  Procedure Date  . Tubal ligation   . Tonsillectomy   . Uvulopalatopharyngoplasty   . Cesarean section   . Nasal sinus surgery     . Partial hysterectomy     Family History  Problem Relation Age of Onset  . Coronary artery disease      1st degree relatvie <50  . Breast cancer      aunt- ? paternal or maternal  . Asthma Sister   . Heart disease Mother     History  Substance Use Topics  . Smoking status: Never Smoker   . Smokeless tobacco: Never Used  . Alcohol Use: No    OB History    Grav Para Term Preterm Abortions TAB SAB Ect Mult Living                  Review of Systems  Constitutional: Negative for fever, chills, appetite change and fatigue.  HENT: Positive for congestion, rhinorrhea and sinus pressure. Negative for hearing loss, ear pain, nosebleeds, sore throat, sneezing, trouble swallowing, neck stiffness, voice change, postnasal drip, tinnitus and ear discharge.   Eyes: Negative for photophobia and visual disturbance.  Respiratory: Negative for apnea, cough, choking, chest tightness, shortness of breath, wheezing and stridor.   Cardiovascular: Negative for chest pain, palpitations and leg swelling.  Gastrointestinal: Negative for nausea, vomiting, abdominal pain, diarrhea and constipation.  Genitourinary: Negative for dysuria, urgency and flank pain.  Musculoskeletal: Negative for myalgias.  Neurological: Positive for headaches. Negative for dizziness, seizures, syncope, weakness, light-headedness and numbness.  Psychiatric/Behavioral: Negative for behavioral problems and confusion.  All other systems reviewed and are negative.  Allergies  Doxycycline; Ciprofloxacin; Hydrocortisone; Neomycin; and Sulfonamide derivatives  Home Medications   Current Outpatient Rx  Name Route Sig Dispense Refill  . ACETAMINOPHEN 500 MG PO TABS Oral Take 500 mg by mouth every 6 (six) hours as needed. For pain    . ALBUTEROL SULFATE HFA 108 (90 BASE) MCG/ACT IN AERS Inhalation Inhale 2 puffs into the lungs every 6 (six) hours as needed. For shortness of breath.    . CLONIDINE HCL 0.2 MG PO TABS Oral Take  0.2 mg by mouth 2 (two) times daily.      . FUROSEMIDE 40 MG PO TABS Oral Take 40 mg by mouth daily as needed. For swelling or fluid retention.    . GUAIFENESIN ER 600 MG PO TB12 Oral Take 600 mg by mouth every evening.    [name] LOSARTAN POTASSIUM-HCTZ 100-25 MG PO TABS Oral Take 1 tablet by mouth every morning.      [name] POTASSIUM CHLORIDE CRYS ER 20 MEQ PO TBCR Oral Take 80 mEq by mouth daily.     [name] OXYMETAZOLINE HCL 0.05 % NA SOLN Nasal Place 2 sprays into the nose 2 (two) times daily as needed. For nasal congestion.      BP 148/79  Pulse 73  Temp(Src) 98.8 F (37.1 C) (Oral)  Resp 18  SpO2 94%  Physical Exam  Constitutional: She is oriented to person, place, and time. She appears well-developed and well-nourished. No distress.  HENT:  Head: Normocephalic and atraumatic. No trismus in the jaw.  Right Ear: No drainage or tenderness. No mastoid tenderness.  Left Ear: No drainage or tenderness. No mastoid tenderness.  Nose: Rhinorrhea present. No sinus tenderness.  Mouth/Throat: Uvula is midline, oropharynx is clear and moist and mucous membranes are normal. No uvula swelling. No oropharyngeal exudate.       Ext canals erythematous bilaterally. No bulging TM. Uvula midline, airway intact, oropharynx clear and moist. ttp over frontal and maxillary sinuses  Eyes: Conjunctivae and EOM are normal. Right eye exhibits no discharge. Left eye exhibits no discharge. No scleral icterus.  Neck: Normal range of motion. Neck supple.  Cardiovascular: Normal rate, regular rhythm and normal heart sounds.   Pulmonary/Chest: Effort normal and breath sounds normal. No stridor. No respiratory distress. She has no wheezes. She exhibits no tenderness.  Abdominal: Soft. There is no tenderness.  Musculoskeletal: Normal range of motion.  Neurological: She is alert and oriented to person, place, and time.  Skin: Skin is warm and dry. No rash noted. She is not diaphoretic.  Psychiatric: She has a normal mood and  affect. Her behavior is normal.    ED Course  Procedures (including critical care time)  Labs Reviewed  POCT I-STAT, CHEM 8 - Abnormal; Notable for the following:    Potassium 3.4 (*)    Glucose, Bld 111 (*)    All other components within normal limits  CBC  DIFFERENTIAL  COMPREHENSIVE METABOLIC PANEL   No results found.   No diagnosis found.    MDM  Sinusitis, otitis   Pt will be discharged with symptomatic treatment & abx verbalizes understanding and is agreeable with plan. Pt is hemodynamically stable & in NAD prior to dc.Advised to follow up w her ENT Dr. Hilma Favors, PA-C 07/02/11 1030

## 2011-07-02 NOTE — Telephone Encounter (Signed)
Pt aware and she will increase K= as ordered - she will be blood work in 10 days.

## 2011-07-02 NOTE — ED Provider Notes (Signed)
Medical screening examination/treatment/procedure(s) were performed by non-physician practitioner and as supervising physician I was immediately available for consultation/collaboration.   Lyanne Co, MD 07/02/11 [phone]

## 2011-07-02 NOTE — Telephone Encounter (Signed)
New msg Pt called and said she had potassium level done at ED. She wanted to know should she up potassium dose.

## 2011-07-02 NOTE — Telephone Encounter (Signed)
Increase KDur to 120 meq daily and repeat the BMET in 10 days.

## 2011-07-02 NOTE — ED Notes (Signed)
Pt states "the congestion has been going on 12 days, I took the sudafed PE and Mucinex"

## 2011-07-12 ENCOUNTER — Other Ambulatory Visit: Payer: No Typology Code available for payment source

## 2011-09-28 ENCOUNTER — Emergency Department (HOSPITAL_COMMUNITY): Payer: No Typology Code available for payment source

## 2011-09-28 ENCOUNTER — Encounter (HOSPITAL_COMMUNITY): Payer: Self-pay | Admitting: Emergency Medicine

## 2011-09-28 ENCOUNTER — Emergency Department (HOSPITAL_COMMUNITY)
Admission: EM | Admit: 2011-09-28 | Discharge: 2011-09-28 | Disposition: A | Discharge status: Home / Self Care | Payer: No Typology Code available for payment source | Attending: Emergency Medicine | Admitting: Emergency Medicine

## 2011-09-28 DIAGNOSIS — I10 Essential (primary) hypertension: Secondary | ICD-10-CM | POA: Insufficient documentation

## 2011-09-28 DIAGNOSIS — Z803 Family history of malignant neoplasm of breast: Secondary | ICD-10-CM | POA: Insufficient documentation

## 2011-09-28 DIAGNOSIS — J45909 Unspecified asthma, uncomplicated: Secondary | ICD-10-CM | POA: Insufficient documentation

## 2011-09-28 DIAGNOSIS — M25569 Pain in unspecified knee: Secondary | ICD-10-CM | POA: Insufficient documentation

## 2011-09-28 DIAGNOSIS — M25469 Effusion, unspecified knee: Secondary | ICD-10-CM | POA: Insufficient documentation

## 2011-09-28 DIAGNOSIS — Z825 Family history of asthma and other chronic lower respiratory diseases: Secondary | ICD-10-CM | POA: Insufficient documentation

## 2011-09-28 DIAGNOSIS — Z8249 Family history of ischemic heart disease and other diseases of the circulatory system: Secondary | ICD-10-CM | POA: Insufficient documentation

## 2011-09-28 DIAGNOSIS — I2510 Atherosclerotic heart disease of native coronary artery without angina pectoris: Secondary | ICD-10-CM | POA: Insufficient documentation

## 2011-09-28 DIAGNOSIS — Z91040 Latex allergy status: Secondary | ICD-10-CM | POA: Insufficient documentation

## 2011-09-28 DIAGNOSIS — M25462 Effusion, left knee: Secondary | ICD-10-CM

## 2011-09-28 DIAGNOSIS — G4733 Obstructive sleep apnea (adult) (pediatric): Secondary | ICD-10-CM | POA: Insufficient documentation

## 2011-09-28 DIAGNOSIS — Z888 Allergy status to other drugs, medicaments and biological substances status: Secondary | ICD-10-CM | POA: Insufficient documentation

## 2011-09-28 DIAGNOSIS — Z881 Allergy status to other antibiotic agents status: Secondary | ICD-10-CM | POA: Insufficient documentation

## 2011-09-28 DIAGNOSIS — M25562 Pain in left knee: Secondary | ICD-10-CM

## 2011-09-28 DIAGNOSIS — Z882 Allergy status to sulfonamides status: Secondary | ICD-10-CM | POA: Insufficient documentation

## 2011-09-28 HISTORY — DX: Sleep apnea, unspecified: G47.30

## 2011-09-28 MED ORDER — MELOXICAM 7.5 MG PO TABS: 7.5000 mg | ORAL_TABLET | Freq: Every day | ORAL | Status: DC

## 2011-09-28 NOTE — ED Notes (Signed)
Swelling to left knee for 2 weeks, no known injury, pain with ambulation, and flexion.

## 2011-09-28 NOTE — ED Notes (Signed)
Pt refusing knee sleeve due to sleeve rolling down on her leg. Irving Burton Pa notified and aware of situation no new orders at this time

## 2011-09-28 NOTE — ED Provider Notes (Signed)
History     CSN: [id number]  Arrival date & time 09/28/11  4098   First MD Initiated Contact with Patient 09/28/11 0805      Chief Complaint  Patient presents with  . Knee Pain    (Consider location/radiation/quality/duration/timing/severity/associated sxs/prior treatment) HPI Comments: Patient reports she has had two weeks of left knee pain and swelling.  Pain is located over the left lateral knee.  States the pain is deep and throbbing, worse at night.  Also worse with palpation and ambulation.  Denies any injury to the knee.  No prior knee surgeries, no hx gout, no other arthropathies.  Pt has been taking tylenol, ibuprofen, and aleve, which have helped with the pain.  Denies fevers, chills, recent illness, numbness or weakness of the joint.   Pt also notes that when she swallows she feels like she has fluid in her ears.  Denies ear pain, sore throat, difficulty swallowing or breathing.  Does admit to post-nasal drip, denies sinus pressure or congestion.     The history is provided by the patient.    Past Medical History  Diagnosis Date  . Hypokalemia   . CHF (congestive heart failure)   . Unspecified adverse effect of unspecified drug, medicinal and biological substance   . Otitis media   . External otitis   . Sinusitis   . Plantar fasciitis   . Family history of ischemic heart disease   . Common migraine   . Morbid obesity   . Hyperlipidemia   . OSA (obstructive sleep apnea)   . Carpal tunnel syndrome   . Hypertension   . CAD (coronary artery disease)   . Asthma   . Allergic rhinitis   . Routine general medical examination at a health care facility   . Shoulder and upper arm, insect bite, nonvenomous, without mention of infection   . Unspecified adverse effect of unspecified drug, medicinal and biological substance   . Acute serous otitis media   . Tubal ligation status   . Old myocardial infarction   . CPAP (continuous positive airway pressure) dependence   .  Sleep apnea     Past Surgical History  Procedure Date  . Tubal ligation   . Tonsillectomy   . Uvulopalatopharyngoplasty   . Cesarean section   . Nasal sinus surgery   . Partial hysterectomy     Family History  Problem Relation Age of Onset  . Coronary artery disease      1st degree relatvie <50  . Breast cancer      aunt- ? paternal or maternal  . Asthma Sister   . Heart disease Mother     History  Substance Use Topics  . Smoking status: Never Smoker   . Smokeless tobacco: Never Used  . Alcohol Use: No    OB History    Grav Para Term Preterm Abortions TAB SAB Ect Mult Living                  Review of Systems  Constitutional: Negative for fever and chills.  HENT: Negative for ear pain, sore throat and trouble swallowing.   Respiratory: Negative for cough.   Genitourinary: Negative for vaginal discharge.  Neurological: Negative for weakness and numbness.    Allergies  Doxycycline; Latex; Ciprofloxacin; Hydrocortisone; Neomycin; and Sulfonamide derivatives  Home Medications   Current Outpatient Rx  Name Route Sig Dispense Refill  . ACETAMINOPHEN 500 MG PO TABS Oral Take 500-1,000 mg by mouth every 6 (six) hours as  needed. For pain    . CLONIDINE HCL 0.2 MG PO TABS Oral Take 0.2 mg by mouth 2 (two) times daily.     [name] FEXOFENADINE HCL 180 MG PO TABS Oral Take 180 mg by mouth daily as needed. For allergies    . FLUTICASONE PROPIONATE 50 MCG/ACT NA SUSP Nasal Place 2 sprays into the nose daily as needed. For allergies    . FUROSEMIDE 40 MG PO TABS Oral Take 80 mg by mouth daily as needed. For swelling or fluid retention.    . GUAIFENESIN ER 600 MG PO TB12 Oral Take 600 mg by mouth 2 (two) times daily as needed. For congestion    . LOSARTAN POTASSIUM-HCTZ 100-25 MG PO TABS Oral Take 1 tablet by mouth every morning.     [name] OXYMETAZOLINE HCL 0.05 % NA SOLN Nasal Place 2 sprays into the nose 2 (two) times daily as needed. For nasal congestion.    [name] POTASSIUM CHLORIDE  CRYS ER 20 MEQ PO TBCR Oral Take 80 mEq by mouth daily.       BP 124/67  Pulse 83  Temp 98.5 F (36.9 C) (Oral)  Resp 18  SpO2 97%  Physical Exam  Nursing note and vitals reviewed. Constitutional: She appears well-developed and well-nourished. No distress.  HENT:  Head: Normocephalic and atraumatic.  Right Ear: Tympanic membrane and external ear normal.  Left Ear: Tympanic membrane and external ear normal.  Mouth/Throat: Oropharynx is clear and moist. No oropharyngeal exudate.  Neck: Neck supple.  Pulmonary/Chest: Effort normal.  Musculoskeletal:       Right knee: Normal.       Left knee: She exhibits effusion. She exhibits normal range of motion, no ecchymosis, no deformity, no laceration, no erythema and normal alignment.       Legs: Neurological: She is alert.  Skin: She is not diaphoretic.  Lower extremities: sensation intact, distal pulses intact.    ED Course  Procedures (including critical care time)  Labs Reviewed - No data to display Dg Knee Complete 4 Views Left  09/28/2011  *RADIOLOGY REPORT*  Clinical Data: Left knee pain.  No known injury.  LEFT KNEE - COMPLETE 4+ VIEW  Comparison: None  Findings: There are tricompartmental degenerative changes with joint space narrowing and osteophytic spurring.  No acute fracture or osteochondral lesion.  There is a suprapatellar knee joint effusion.  IMPRESSION:  1.  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 2.  Suprapatellar knee joint effusion.   Original Report Authenticated By: P. Loralie Champagne, M.D.    8:49 AM Discussed patient with Dr Fonnie Jarvis.  1. Left knee pain   2. Knee effusion, left       MDM  Pt with nontraumatic knee effusion in the setting of tricompatmental degenerative changes on xray.  Pt is afebrile, nontoxic.  Knee is only mildly tender over inferolateral aspect.  No erythema or warmth.  Doubt septic joint or gout.  Neurovascularly intact.  Discussed with Dr Fonnie Jarvis - given  degenerative changes on xray, 2 weeks of consistent symptoms, no fever, no clinical concern for septic joint, will not perform arthrocentesis today.  Pt has PCP, previously seen at [facility] primary care and is currently between PCPs, does see [facility].  I have encouraged her to follow up with the [facility] primary care office and find a new provider for close follow up.  Recommended RICE treatment, ordered knee sleeve, though patient is morbidly obese and the sleeve "rolls down" and may not be effective.  D/C home with mobic.  Pt informed that she should stop taking ibuprofen and aleve.  Discussed all results with patient.  Pt given return precautions.  Pt verbalizes understanding and agrees with plan.           [place], [place] 09/28/11 [phone]

## 2011-09-28 NOTE — ED Provider Notes (Signed)
Medical screening examination/treatment/procedure(s) were performed by non-physician practitioner and as supervising physician I was immediately available for consultation/collaboration.   Aliani Caccavale M Sky Primo, MD 09/28/11 2043 

## 2011-11-16 ENCOUNTER — Emergency Department (HOSPITAL_COMMUNITY)
Admission: EM | Admit: 2011-11-16 | Discharge: 2011-11-16 | Disposition: A | Discharge status: Home / Self Care | Payer: No Typology Code available for payment source | Attending: Emergency Medicine | Admitting: Emergency Medicine

## 2011-11-16 ENCOUNTER — Encounter (HOSPITAL_COMMUNITY): Payer: Self-pay | Admitting: Emergency Medicine

## 2011-11-16 DIAGNOSIS — Z87828 Personal history of other (healed) physical injury and trauma: Secondary | ICD-10-CM | POA: Insufficient documentation

## 2011-11-16 DIAGNOSIS — Z8709 Personal history of other diseases of the respiratory system: Secondary | ICD-10-CM | POA: Insufficient documentation

## 2011-11-16 DIAGNOSIS — Z79899 Other long term (current) drug therapy: Secondary | ICD-10-CM | POA: Insufficient documentation

## 2011-11-16 DIAGNOSIS — I252 Old myocardial infarction: Secondary | ICD-10-CM | POA: Insufficient documentation

## 2011-11-16 DIAGNOSIS — Z8639 Personal history of other endocrine, nutritional and metabolic disease: Secondary | ICD-10-CM | POA: Insufficient documentation

## 2011-11-16 DIAGNOSIS — I10 Essential (primary) hypertension: Secondary | ICD-10-CM | POA: Insufficient documentation

## 2011-11-16 DIAGNOSIS — R05 Cough: Secondary | ICD-10-CM | POA: Insufficient documentation

## 2011-11-16 DIAGNOSIS — R059 Cough, unspecified: Secondary | ICD-10-CM | POA: Insufficient documentation

## 2011-11-16 DIAGNOSIS — Z862 Personal history of diseases of the blood and blood-forming organs and certain disorders involving the immune mechanism: Secondary | ICD-10-CM | POA: Insufficient documentation

## 2011-11-16 DIAGNOSIS — J45909 Unspecified asthma, uncomplicated: Secondary | ICD-10-CM | POA: Insufficient documentation

## 2011-11-16 DIAGNOSIS — E785 Hyperlipidemia, unspecified: Secondary | ICD-10-CM | POA: Insufficient documentation

## 2011-11-16 DIAGNOSIS — I509 Heart failure, unspecified: Secondary | ICD-10-CM | POA: Insufficient documentation

## 2011-11-16 DIAGNOSIS — I2510 Atherosclerotic heart disease of native coronary artery without angina pectoris: Secondary | ICD-10-CM | POA: Insufficient documentation

## 2011-11-16 DIAGNOSIS — J019 Acute sinusitis, unspecified: Secondary | ICD-10-CM | POA: Insufficient documentation

## 2011-11-16 DIAGNOSIS — G5600 Carpal tunnel syndrome, unspecified upper limb: Secondary | ICD-10-CM | POA: Insufficient documentation

## 2011-11-16 MED ORDER — AMOXICILLIN-POT CLAVULANATE 875-125 MG PO TABS: 1.0000 | ORAL_TABLET | Freq: Two times a day (BID) | ORAL | Status: DC

## 2011-11-16 NOTE — ED Provider Notes (Signed)
History     CSN: [id number]  Arrival date & time 11/16/11  8469   First MD Initiated Contact with Patient 11/16/11 [phone]      Chief Complaint  Patient presents with  . Nasal Congestion  . Cough    (Consider location/radiation/quality/duration/timing/severity/associated sxs/prior treatment) HPIBertha DERIYAH KUNATH is a 51 y.o. female presents with a nine-day history of facial pressure and nasal congestion. Patient has tried several over-the-counter remedies including antihistamines, phenylephrine, pseudoephedrine, guaifenesin without relief. Over the course of the last 2 days, her sinus drainage has turned green, it is foul tasting, it is increased in volume and is your teacher her throat. She acknowledges having chills yesterday. Her facial pressure is over the maxillary and frontal sinuses. Pain is moderate, nonradiating, she is not complaining about headache, no stiff neck, no vomiting, abdominal pain or diarrhea.    Past Medical History  Diagnosis Date  . Hypokalemia   . CHF (congestive heart failure)   . Unspecified adverse effect of unspecified drug, medicinal and biological substance   . Otitis media   . External otitis   . Sinusitis   . Plantar fasciitis   . Family history of ischemic heart disease   . Common migraine   . Morbid obesity   . Hyperlipidemia   . OSA (obstructive sleep apnea)   . Carpal tunnel syndrome   . Hypertension   . CAD (coronary artery disease)   . Asthma   . Allergic rhinitis   . Routine general medical examination at a health care facility   . Shoulder and upper arm, insect bite, nonvenomous, without mention of infection(912.4)   . Unspecified adverse effect of unspecified drug, medicinal and biological substance   . Acute serous otitis media   . Tubal ligation status   . Old myocardial infarction   . CPAP (continuous positive airway pressure) dependence   . Sleep apnea     Past Surgical History  Procedure Date  . Tubal ligation   .  Tonsillectomy   . Uvulopalatopharyngoplasty   . Cesarean section   . Nasal sinus surgery   . Partial hysterectomy     Family History  Problem Relation Age of Onset  . Coronary artery disease      1st degree relatvie <50  . Breast cancer      aunt- ? paternal or maternal  . Asthma Sister   . Heart disease Mother     History  Substance Use Topics  . Smoking status: Never Smoker   . Smokeless tobacco: Never Used  . Alcohol Use: No    OB History    Grav Para Term Preterm Abortions TAB SAB Ect Mult Living                  Review of Systems At least 10pt or greater review of systems completed and are negative except where specified in the HPI.  Allergies  Doxycycline; Latex; Ciprofloxacin; Hydrocortisone; Neomycin; and Sulfonamide derivatives  Home Medications   Current Outpatient Rx  Name Route Sig Dispense Refill  . ACETAMINOPHEN 500 MG PO TABS Oral Take 500-1,000 mg by mouth every 6 (six) hours as needed. For pain    . AMOXICILLIN-POT CLAVULANATE 875-125 MG PO TABS Oral Take 1 tablet by mouth every 12 (twelve) hours. 14 tablet 0  . CLONIDINE HCL 0.2 MG PO TABS Oral Take 0.2 mg by mouth 2 (two) times daily.     [name] FEXOFENADINE HCL 180 MG PO TABS Oral Take 180 mg by mouth  daily as needed. For allergies    . FLUTICASONE PROPIONATE 50 MCG/ACT NA SUSP Nasal Place 2 sprays into the nose daily as needed. For allergies    . FUROSEMIDE 40 MG PO TABS Oral Take 80 mg by mouth daily as needed. For swelling or fluid retention.    . GUAIFENESIN ER 600 MG PO TB12 Oral Take 600 mg by mouth 2 (two) times daily as needed. For congestion    . LOSARTAN POTASSIUM-HCTZ 100-25 MG PO TABS Oral Take 1 tablet by mouth every morning.     [name] MELOXICAM 7.5 MG PO TABS Oral Take 1 tablet (7.5 mg total) by mouth daily. 30 tablet 0  . OXYMETAZOLINE HCL 0.05 % NA SOLN Nasal Place 2 sprays into the nose 2 (two) times daily as needed. For nasal congestion.    [name] POTASSIUM CHLORIDE CRYS ER 20 MEQ PO TBCR Oral  Take 80 mEq by mouth daily.       BP 190/91  Pulse 65  Temp 98 F (36.7 C) (Oral)  Resp 20  SpO2 98%  Physical Exam  Nursing notes reviewed.  Electronic medical record reviewed. VITAL SIGNS:   Filed Vitals:   11/16/11 0756  BP: 190/91  Pulse: 65  Temp: 98 F (36.7 C)  TempSrc: Oral  Resp: 20  SpO2: 98%   CONSTITUTIONAL: Awake, oriented, appears non-toxic HENT: Atraumatic, normocephalic, oral mucosa pink and moist, airway patent. Nares patent with yellow discharge. Tenderness to percussion over maxillary and frontal sinuses. External ears normal, scant fluid behind left TM. Otherwise TMs are normal without erythema. EYES: Conjunctiva clear, EOMI, PERRLA NECK: Trachea midline, non-tender, supple CARDIOVASCULAR: Normal heart rate, Normal rhythm, No murmurs, rubs, gallops PULMONARY/CHEST: Clear to auscultation, no rhonchi, wheezes, or rales. Symmetrical breath sounds. Non-tender. ABDOMINAL: Non-distended, soft, non-tender - no rebound or guarding.  BS normal. NEUROLOGIC: Non-focal, moving all four extremities, no gross sensory or motor deficits. EXTREMITIES: No clubbing, cyanosis, or edema SKIN: Warm, Dry, No erythema, No rash  ED Course  Procedures (including critical care time)  Labs Reviewed - No data to display No results found.   1. Acute sinusitis       MDM  AMMY LIENHARD is a 51 y.o. female presenting with acute sinusitis. Patient has an AAO-HNS sinusitis score of 5.  Patient has tried multiple over-the-counter remedies including her Allegra without relief. Due to the change acutely of her drainage - feel antibiotic treatment with Augmentin is appropriate at this time. She is nontoxic, afebrile, vital signs stable  - she is hypertensive however is on medication for that. No complaints of severe headache or chest pain.     Followup with primary care physician as needed.   I explained the diagnosis and have given explicit precautions to return to the ER including  severe headache, stiff neck, elevated fever, severe diarrhea, bloody diarrhea or abdominal pain or any other new or worsening symptoms. The patient understands and accepts the medical plan as it's been dictated and I have answered their questions. Discharge instructions concerning home care and prescriptions have been given.  The patient is STABLE and is discharged to home in good condition.          Jones Skene, MD 11/16/11 [phone]

## 2011-11-16 NOTE — ED Notes (Signed)
Pt presents w/ 10 day hx of facial pressure and nasal congestion. Now w/ 2 day hx of coughing up green mucous, continues to have face pressure and nasal congestion. Rx'ed w/ Tylenol Sinus, Mussinex and Allegra at home w/o any improvement in symptoms.

## 2011-12-08 ENCOUNTER — Encounter (HOSPITAL_COMMUNITY): Payer: Self-pay | Admitting: Emergency Medicine

## 2011-12-08 ENCOUNTER — Emergency Department (HOSPITAL_COMMUNITY)
Admission: EM | Admit: 2011-12-08 | Discharge: 2011-12-08 | Disposition: A | Discharge status: Home / Self Care | Payer: No Typology Code available for payment source | Attending: Emergency Medicine | Admitting: Emergency Medicine

## 2011-12-08 DIAGNOSIS — Z8669 Personal history of other diseases of the nervous system and sense organs: Secondary | ICD-10-CM | POA: Insufficient documentation

## 2011-12-08 DIAGNOSIS — Z8739 Personal history of other diseases of the musculoskeletal system and connective tissue: Secondary | ICD-10-CM | POA: Insufficient documentation

## 2011-12-08 DIAGNOSIS — J019 Acute sinusitis, unspecified: Secondary | ICD-10-CM | POA: Insufficient documentation

## 2011-12-08 DIAGNOSIS — Z79899 Other long term (current) drug therapy: Secondary | ICD-10-CM | POA: Insufficient documentation

## 2011-12-08 DIAGNOSIS — I10 Essential (primary) hypertension: Secondary | ICD-10-CM | POA: Insufficient documentation

## 2011-12-08 DIAGNOSIS — I509 Heart failure, unspecified: Secondary | ICD-10-CM | POA: Insufficient documentation

## 2011-12-08 DIAGNOSIS — J3489 Other specified disorders of nose and nasal sinuses: Secondary | ICD-10-CM | POA: Insufficient documentation

## 2011-12-08 DIAGNOSIS — J45909 Unspecified asthma, uncomplicated: Secondary | ICD-10-CM | POA: Insufficient documentation

## 2011-12-08 DIAGNOSIS — G5600 Carpal tunnel syndrome, unspecified upper limb: Secondary | ICD-10-CM | POA: Insufficient documentation

## 2011-12-08 DIAGNOSIS — I2510 Atherosclerotic heart disease of native coronary artery without angina pectoris: Secondary | ICD-10-CM | POA: Insufficient documentation

## 2011-12-08 DIAGNOSIS — E785 Hyperlipidemia, unspecified: Secondary | ICD-10-CM | POA: Insufficient documentation

## 2011-12-08 MED ORDER — AMOXICILLIN-POT CLAVULANATE 875-125 MG PO TABS: 1.0000 | ORAL_TABLET | Freq: Two times a day (BID) | ORAL | Status: DC

## 2011-12-08 MED ORDER — FLUTICASONE PROPIONATE 50 MCG/ACT NA SUSP: 2.0000 | Freq: Every day | NASAL | Status: DC

## 2011-12-08 NOTE — ED Notes (Signed)
Pt has sx of sinus pressure x 1 week. Hx of recurrent infections

## 2011-12-08 NOTE — ED Provider Notes (Signed)
Medical screening examination/treatment/procedure(s) were performed by non-physician practitioner and as supervising physician I was immediately available for consultation/collaboration. Devoria Albe, MD, Armando Gang   Ward Givens, MD 12/08/11 [phone]

## 2011-12-08 NOTE — ED Provider Notes (Signed)
History     CSN: [id number]  Arrival date & time 12/08/11  1017   First MD Initiated Contact with Patient 12/08/11 1027      Chief Complaint  Patient presents with  . Nasal Congestion    sinus pressure and nasal congestion x1 week  . Headache    (Consider location/radiation/quality/duration/timing/severity/associated sxs/prior treatment) HPI Comments: Patient presents today with a chief complaint of sinus pressure.  She reports that she has had pressure in her frontal sinuses for the past week, which is gradually worsening.  She has also had post nasal drip, congestion, and fullness in her ear.  She was seen in the ED for similar symptoms on 11/16/11.  At that time she was given a course of Augmentin, which she reports helped.  However, the sinus infection returned.  She has a history of recurrent Sinusitis.  She has not taken any medication for this sinus infection.  Patient is a 51 y.o. female presenting with headaches. The history is provided by the patient.  Headache  Pertinent negatives include no fever and no shortness of breath.    Past Medical History  Diagnosis Date  . Hypokalemia   . CHF (congestive heart failure)   . Unspecified adverse effect of unspecified drug, medicinal and biological substance   . Otitis media   . External otitis   . Sinusitis   . Plantar fasciitis   . Family history of ischemic heart disease   . Common migraine   . Morbid obesity   . Hyperlipidemia   . OSA (obstructive sleep apnea)   . Carpal tunnel syndrome   . Hypertension   . CAD (coronary artery disease)   . Asthma   . Allergic rhinitis   . Routine general medical examination at a health care facility   . Shoulder and upper arm, insect bite, nonvenomous, without mention of infection(912.4)   . Unspecified adverse effect of unspecified drug, medicinal and biological substance   . Acute serous otitis media   . Tubal ligation status   . Old myocardial infarction   . CPAP (continuous  positive airway pressure) dependence   . Sleep apnea     Past Surgical History  Procedure Date  . Tubal ligation   . Tonsillectomy   . Uvulopalatopharyngoplasty   . Cesarean section   . Nasal sinus surgery   . Partial hysterectomy     Family History  Problem Relation Age of Onset  . Coronary artery disease      1st degree relatvie <50  . Breast cancer      aunt- ? paternal or maternal  . Asthma Sister   . Heart disease Mother     History  Substance Use Topics  . Smoking status: Never Smoker   . Smokeless tobacco: Never Used  . Alcohol Use: No    OB History    Grav Para Term Preterm Abortions TAB SAB Ect Mult Living                  Review of Systems  Constitutional: Negative for fever and chills.  HENT: Positive for congestion, rhinorrhea and sinus pressure. Negative for neck pain and neck stiffness.        Ear fullness  Eyes: Negative for visual disturbance.  Respiratory: Negative for cough and shortness of breath.   Neurological: Positive for headaches.    Allergies  Doxycycline; Latex; Ciprofloxacin; Hydrocortisone; Neomycin; and Sulfonamide derivatives  Home Medications   Current Outpatient Rx  Name  Route  Sig  Dispense  Refill  . ACETAMINOPHEN 500 MG PO TABS   Oral   Take 500-1,000 mg by mouth every 6 (six) hours as needed. For pain         . CLONIDINE HCL 0.2 MG PO TABS   Oral   Take 0.2 mg by mouth 2 (two) times daily.          [name] FEXOFENADINE HCL 180 MG PO TABS   Oral   Take 180 mg by mouth daily as needed. For allergies         . FUROSEMIDE 40 MG PO TABS   Oral   Take 80 mg by mouth daily as needed. For swelling or fluid retention.         . GUAIFENESIN ER 600 MG PO TB12   Oral   Take 600 mg by mouth 2 (two) times daily as needed. For congestion         . LOSARTAN POTASSIUM-HCTZ 100-25 MG PO TABS   Oral   Take 1 tablet by mouth every morning.          [name] POTASSIUM CHLORIDE CRYS ER 20 MEQ PO TBCR   Oral   Take 40 mEq by  mouth daily.          [name] FLUTICASONE PROPIONATE 50 MCG/ACT NA SUSP   Nasal   Place 2 sprays into the nose daily as needed. For allergies           BP 141/74  Pulse 69  Temp 98.1 F (36.7 C)  Resp 20  SpO2 97%  Physical Exam  Nursing note and vitals reviewed. Constitutional: She appears well-developed and well-nourished.  HENT:  Head: Normocephalic and atraumatic.  Right Ear: Tympanic membrane and ear canal normal.  Left Ear: Tympanic membrane and ear canal normal.  Nose: Mucosal edema and rhinorrhea present. Right sinus exhibits frontal sinus tenderness. Right sinus exhibits no maxillary sinus tenderness. Left sinus exhibits frontal sinus tenderness. Left sinus exhibits no maxillary sinus tenderness.  Mouth/Throat: Uvula is midline, oropharynx is clear and moist and mucous membranes are normal.  Neck: Normal range of motion. Neck supple.  Cardiovascular: Normal rate, regular rhythm and normal heart sounds.   Pulmonary/Chest: Effort normal and breath sounds normal. No respiratory distress. She has no wheezes. She has no rales.  Lymphadenopathy:    She has cervical adenopathy.  Neurological: She is alert.  Skin: Skin is warm and dry.  Psychiatric: She has a normal mood and affect.    ED Course  Procedures (including critical care time)  Labs Reviewed - No data to display No results found.   No diagnosis found.    MDM  Signs and symptoms consistent with Acute Sinusitis.  Patient given Rx for Augmentin and Flonase.  Patient afebrile.  Patient instructed to follow up with PCP if symptoms do not improve.  Return precautions discussed with the patient.        Pascal Lux Gorham, PA-C 12/08/11 [phone]

## 2011-12-30 ENCOUNTER — Other Ambulatory Visit: Payer: Self-pay | Admitting: *Deleted

## 2011-12-30 MED ORDER — LOSARTAN POTASSIUM-HCTZ 100-25 MG PO TABS: 1.0000 | ORAL_TABLET | ORAL | Status: DC

## 2012-01-06 ENCOUNTER — Encounter (HOSPITAL_COMMUNITY): Payer: Self-pay | Admitting: Emergency Medicine

## 2012-01-06 ENCOUNTER — Emergency Department (HOSPITAL_COMMUNITY)
Admission: EM | Admit: 2012-01-06 | Discharge: 2012-01-06 | Disposition: A | Discharge status: Home / Self Care | Payer: No Typology Code available for payment source | Attending: Emergency Medicine | Admitting: Emergency Medicine

## 2012-01-06 DIAGNOSIS — G4733 Obstructive sleep apnea (adult) (pediatric): Secondary | ICD-10-CM | POA: Insufficient documentation

## 2012-01-06 DIAGNOSIS — G43909 Migraine, unspecified, not intractable, without status migrainosus: Secondary | ICD-10-CM | POA: Insufficient documentation

## 2012-01-06 DIAGNOSIS — E785 Hyperlipidemia, unspecified: Secondary | ICD-10-CM | POA: Insufficient documentation

## 2012-01-06 DIAGNOSIS — H6690 Otitis media, unspecified, unspecified ear: Secondary | ICD-10-CM | POA: Insufficient documentation

## 2012-01-06 DIAGNOSIS — I252 Old myocardial infarction: Secondary | ICD-10-CM | POA: Insufficient documentation

## 2012-01-06 DIAGNOSIS — J45909 Unspecified asthma, uncomplicated: Secondary | ICD-10-CM | POA: Insufficient documentation

## 2012-01-06 DIAGNOSIS — G4730 Sleep apnea, unspecified: Secondary | ICD-10-CM | POA: Insufficient documentation

## 2012-01-06 DIAGNOSIS — H6692 Otitis media, unspecified, left ear: Secondary | ICD-10-CM

## 2012-01-06 DIAGNOSIS — Z79899 Other long term (current) drug therapy: Secondary | ICD-10-CM | POA: Insufficient documentation

## 2012-01-06 DIAGNOSIS — R059 Cough, unspecified: Secondary | ICD-10-CM | POA: Insufficient documentation

## 2012-01-06 DIAGNOSIS — I2510 Atherosclerotic heart disease of native coronary artery without angina pectoris: Secondary | ICD-10-CM | POA: Insufficient documentation

## 2012-01-06 DIAGNOSIS — Z9851 Tubal ligation status: Secondary | ICD-10-CM | POA: Insufficient documentation

## 2012-01-06 DIAGNOSIS — I10 Essential (primary) hypertension: Secondary | ICD-10-CM | POA: Insufficient documentation

## 2012-01-06 DIAGNOSIS — R05 Cough: Secondary | ICD-10-CM | POA: Insufficient documentation

## 2012-01-06 DIAGNOSIS — I509 Heart failure, unspecified: Secondary | ICD-10-CM | POA: Insufficient documentation

## 2012-01-06 MED ORDER — AMOXICILLIN-POT CLAVULANATE 875-125 MG PO TABS: 1.0000 | ORAL_TABLET | Freq: Two times a day (BID) | ORAL | Status: DC

## 2012-01-06 MED ORDER — AMOXICILLIN-POT CLAVULANATE 875-125 MG PO TABS
1.0000 | ORAL_TABLET | Freq: Once | ORAL | Status: AC
  Administered 2012-01-06: 1 via ORAL
  Filled 2012-01-06: qty 1

## 2012-01-06 NOTE — ED Notes (Signed)
Pt presenting to ed with c/o left side ear pain x 4 days. Pt states she also has drainage from her throat that's productive thick, white and bitter tasting with odor. Pt denies fever and sore throat at this time.

## 2012-01-06 NOTE — ED Provider Notes (Signed)
History     CSN: [id number]  Arrival date & time 01/06/12  8295   First MD Initiated Contact with Patient 01/06/12 0745      Chief Complaint  Patient presents with  . Otalgia    (Consider location/radiation/quality/duration/timing/severity/associated sxs/prior treatment) The history is provided by the patient.  [name] is a 51 y.o. female hx of otitis media, CHF, hypertension, OSA here presenting with left ear pain. Left ear pain and sinus congestion for 4 days. She has been trying to use Sudafed and nasal saline with minimal improvement. Denies any fevers or chills. She has frequent otitis media and sinusitis and was recently on Augmentin a month ago appear. She also notes that she has been coughing up some whitish sputum. Denies any abdominal pain or vomiting.   Past Medical History  Diagnosis Date  . Hypokalemia   . CHF (congestive heart failure)   . Unspecified adverse effect of unspecified drug, medicinal and biological substance   . Otitis media   . External otitis   . Sinusitis   . Plantar fasciitis   . Family history of ischemic heart disease   . Common migraine   . Morbid obesity   . Hyperlipidemia   . OSA (obstructive sleep apnea)   . Carpal tunnel syndrome   . Hypertension   . CAD (coronary artery disease)   . Asthma   . Allergic rhinitis   . Routine general medical examination at a health care facility   . Shoulder and upper arm, insect bite, nonvenomous, without mention of infection(912.4)   . Unspecified adverse effect of unspecified drug, medicinal and biological substance   . Acute serous otitis media   . Tubal ligation status   . Old myocardial infarction   . CPAP (continuous positive airway pressure) dependence   . Sleep apnea     Past Surgical History  Procedure Date  . Tubal ligation   . Tonsillectomy   . Uvulopalatopharyngoplasty   . Cesarean section   . Nasal sinus surgery   . Partial hysterectomy     Family History  Problem  Relation Age of Onset  . Coronary artery disease      1st degree relatvie <50  . Breast cancer      aunt- ? paternal or maternal  . Asthma Sister   . Heart disease Mother     History  Substance Use Topics  . Smoking status: Never Smoker   . Smokeless tobacco: Never Used  . Alcohol Use: No    OB History    Grav Para Term Preterm Abortions TAB SAB Ect Mult Living                  Review of Systems  HENT: Positive for ear pain.   Respiratory: Positive for cough.   All other systems reviewed and are negative.    Allergies  Doxycycline; Latex; Ciprofloxacin; Hydrocortisone; Neomycin; and Sulfonamide derivatives  Home Medications   Current Outpatient Rx  Name  Route  Sig  Dispense  Refill  . ACETAMINOPHEN 500 MG PO TABS   Oral   Take 500-1,000 mg by mouth every 6 (six) hours as needed. For pain         . AMOXICILLIN-POT CLAVULANATE 875-125 MG PO TABS   Oral   Take 1 tablet by mouth 2 (two) times daily.   20 tablet   0   . CLONIDINE HCL 0.2 MG PO TABS   Oral   Take 0.2 mg by mouth  2 (two) times daily.          [name] FEXOFENADINE HCL 180 MG PO TABS   Oral   Take 180 mg by mouth daily as needed. For allergies         . FLUTICASONE PROPIONATE 50 MCG/ACT NA SUSP   Nasal   Place 2 sprays into the nose daily as needed. For allergies         . FLUTICASONE PROPIONATE 50 MCG/ACT NA SUSP   Nasal   Place 2 sprays into the nose daily.   16 g   2   . FUROSEMIDE 40 MG PO TABS   Oral   Take 80 mg by mouth daily as needed. For swelling or fluid retention.         . GUAIFENESIN ER 600 MG PO TB12   Oral   Take 600 mg by mouth 2 (two) times daily as needed. For congestion         . LOSARTAN POTASSIUM-HCTZ 100-25 MG PO TABS   Oral   Take 1 tablet by mouth every morning.   30 tablet   3   . POTASSIUM CHLORIDE CRYS ER 20 MEQ PO TBCR   Oral   Take 40 mEq by mouth daily.            BP 181/86  Pulse 62  Temp 98.6 F (37 C) (Oral)  Resp 20  SpO2  99%  Physical Exam  Nursing note and vitals reviewed. Constitutional: She is oriented to person, place, and time. She appears well-developed and well-nourished.  HENT:  Head: Normocephalic.  Mouth/Throat: Oropharynx is clear and moist.       L TM with extensive scarring, ? Effusion. R TM nl. OP not red, no tonsils (had tonsillectomy in the past).   Eyes: Conjunctivae normal are normal. Pupils are equal, round, and reactive to light.  Neck: Normal range of motion. Neck supple.       No cervical LAD   Cardiovascular: Normal rate, regular rhythm and normal heart sounds.   Pulmonary/Chest: Effort normal and breath sounds normal. No respiratory distress. She has no wheezes. She has no rales.  Abdominal: Soft. Bowel sounds are normal. She exhibits no distension. There is no tenderness. There is no rebound.  Musculoskeletal: Normal range of motion.  Neurological: She is alert and oriented to person, place, and time.  Skin: Skin is warm and dry.  Psychiatric: She has a normal mood and affect. Her behavior is normal. Judgment and thought content normal.    ED Course  Procedures (including critical care time)  Labs Reviewed - No data to display No results found.   No diagnosis found.    MDM  [name] is a 51 y.o. female here with L ear pain and cough. L ear has chronic scarring so exam is difficult. There may be some effusion and she has multiple otitis media in the past. Will start on a course of augmentin for now. I do not appreciate signs of pneumonia on exam so xray not performed.          Richardean Canal, MD 01/06/12 [phone]

## 2012-02-24 ENCOUNTER — Emergency Department (HOSPITAL_COMMUNITY)
Admission: EM | Admit: 2012-02-24 | Discharge: 2012-02-24 | Disposition: A | Discharge status: Home / Self Care | Payer: No Typology Code available for payment source | Attending: Emergency Medicine | Admitting: Emergency Medicine

## 2012-02-24 ENCOUNTER — Encounter (HOSPITAL_COMMUNITY): Payer: Self-pay | Admitting: Emergency Medicine

## 2012-02-24 ENCOUNTER — Emergency Department (HOSPITAL_COMMUNITY): Payer: No Typology Code available for payment source

## 2012-02-24 DIAGNOSIS — Z9851 Tubal ligation status: Secondary | ICD-10-CM | POA: Insufficient documentation

## 2012-02-24 DIAGNOSIS — IMO0002 Reserved for concepts with insufficient information to code with codable children: Secondary | ICD-10-CM | POA: Insufficient documentation

## 2012-02-24 DIAGNOSIS — J3489 Other specified disorders of nose and nasal sinuses: Secondary | ICD-10-CM | POA: Insufficient documentation

## 2012-02-24 DIAGNOSIS — J45909 Unspecified asthma, uncomplicated: Secondary | ICD-10-CM | POA: Insufficient documentation

## 2012-02-24 DIAGNOSIS — G4733 Obstructive sleep apnea (adult) (pediatric): Secondary | ICD-10-CM | POA: Insufficient documentation

## 2012-02-24 DIAGNOSIS — Z8739 Personal history of other diseases of the musculoskeletal system and connective tissue: Secondary | ICD-10-CM | POA: Insufficient documentation

## 2012-02-24 DIAGNOSIS — Z8669 Personal history of other diseases of the nervous system and sense organs: Secondary | ICD-10-CM | POA: Insufficient documentation

## 2012-02-24 DIAGNOSIS — Z87828 Personal history of other (healed) physical injury and trauma: Secondary | ICD-10-CM | POA: Insufficient documentation

## 2012-02-24 DIAGNOSIS — J069 Acute upper respiratory infection, unspecified: Secondary | ICD-10-CM | POA: Insufficient documentation

## 2012-02-24 DIAGNOSIS — Z8619 Personal history of other infectious and parasitic diseases: Secondary | ICD-10-CM | POA: Insufficient documentation

## 2012-02-24 DIAGNOSIS — Z8709 Personal history of other diseases of the respiratory system: Secondary | ICD-10-CM | POA: Insufficient documentation

## 2012-02-24 DIAGNOSIS — I252 Old myocardial infarction: Secondary | ICD-10-CM | POA: Insufficient documentation

## 2012-02-24 DIAGNOSIS — Z8679 Personal history of other diseases of the circulatory system: Secondary | ICD-10-CM | POA: Insufficient documentation

## 2012-02-24 DIAGNOSIS — I2510 Atherosclerotic heart disease of native coronary artery without angina pectoris: Secondary | ICD-10-CM | POA: Insufficient documentation

## 2012-02-24 DIAGNOSIS — E785 Hyperlipidemia, unspecified: Secondary | ICD-10-CM | POA: Insufficient documentation

## 2012-02-24 DIAGNOSIS — I10 Essential (primary) hypertension: Secondary | ICD-10-CM | POA: Insufficient documentation

## 2012-02-24 DIAGNOSIS — I509 Heart failure, unspecified: Secondary | ICD-10-CM | POA: Insufficient documentation

## 2012-02-24 DIAGNOSIS — Z79899 Other long term (current) drug therapy: Secondary | ICD-10-CM | POA: Insufficient documentation

## 2012-02-24 LAB — CBC WITH DIFFERENTIAL/PLATELET
Basophils Absolute: 0 10*3/uL (ref 0.0–0.1)
Basophils Relative: 0 % (ref 0–1)
Eosinophils Relative: 4 % (ref 0–5)
HCT: 38.1 % (ref 36.0–46.0)
MCHC: 33.6 g/dL (ref 30.0–36.0)
MCV: 89.6 fL (ref 78.0–100.0)
Monocytes Absolute: 0.6 10*3/uL (ref 0.1–1.0)
RDW: 13.8 % (ref 11.5–15.5)

## 2012-02-24 LAB — BASIC METABOLIC PANEL
CO2: 25 mEq/L (ref 19–32)
Calcium: 8.9 mg/dL (ref 8.4–10.5)
Creatinine, Ser: 0.55 mg/dL (ref 0.50–1.10)

## 2012-02-24 MED ORDER — BENZONATATE 200 MG PO CAPS: 200.0000 mg | ORAL_CAPSULE | Freq: Three times a day (TID) | ORAL | Status: DC | PRN

## 2012-02-24 MED ORDER — LOSARTAN POTASSIUM-HCTZ 100-25 MG PO TABS: 1.0000 | ORAL_TABLET | Freq: Every day | ORAL | Status: DC

## 2012-02-24 MED ORDER — AMOXICILLIN 500 MG PO CAPS: 500.0000 mg | ORAL_CAPSULE | Freq: Three times a day (TID) | ORAL | Status: DC

## 2012-02-24 NOTE — ED Notes (Signed)
Patient transported to X-ray 

## 2012-02-24 NOTE — ED Notes (Signed)
Pt states has had a congested cough w/ green/yellow sputum and sore throat x 5 days, also has high blood pressure states took clonidine but not her other blood pressure medication, states she usually gets a headache when BP is high but denies headache.

## 2012-02-24 NOTE — ED Notes (Signed)
Pt complains of coughing x 5 days and sore throat. Pt has elevated blood pressure at this time.

## 2012-02-24 NOTE — ED Provider Notes (Signed)
History     CSN: [id number]  Arrival date & time 02/24/12  1624   First MD Initiated Contact with Patient 02/24/12 1731      Chief Complaint  Patient presents with  . Cough    (Consider location/radiation/quality/duration/timing/severity/associated sxs/prior treatment) HPI  KASUMI DITULLIO is a 52 y.o. female past medical history significant for CHF, CAD, morbid obesity, hypertension complaining of productive, positional cough, nasal congestion and rhinorrhea x 5 days. Denies SOB, Chest pain, palpitations, fever, abdominal pain, nausea vomiting, change in bowel or bladder habits.   Past Medical History  Diagnosis Date  . Hypokalemia   . CHF (congestive heart failure)   . Unspecified adverse effect of unspecified drug, medicinal and biological substance   . Otitis media   . External otitis   . Sinusitis   . Plantar fasciitis   . Family history of ischemic heart disease   . Common migraine   . Morbid obesity   . Hyperlipidemia   . OSA (obstructive sleep apnea)   . Carpal tunnel syndrome   . Hypertension   . CAD (coronary artery disease)   . Asthma   . Allergic rhinitis   . Routine general medical examination at a health care facility   . Shoulder and upper arm, insect bite, nonvenomous, without mention of infection(912.4)   . Unspecified adverse effect of unspecified drug, medicinal and biological substance   . Acute serous otitis media   . Tubal ligation status   . Old myocardial infarction   . CPAP (continuous positive airway pressure) dependence   . Sleep apnea     Past Surgical History  Procedure Date  . Tubal ligation   . Tonsillectomy   . Uvulopalatopharyngoplasty   . Cesarean section   . Nasal sinus surgery   . Partial hysterectomy     Family History  Problem Relation Age of Onset  . Coronary artery disease      1st degree relatvie <50  . Breast cancer      aunt- ? paternal or maternal  . Asthma Sister   . Heart disease Mother     History   Substance Use Topics  . Smoking status: Never Smoker   . Smokeless tobacco: Never Used  . Alcohol Use: No    OB History    Grav Para Term Preterm Abortions TAB SAB Ect Mult Living                  Review of Systems  Constitutional: Negative for fever.  HENT: Positive for rhinorrhea.   Respiratory: Positive for cough. Negative for shortness of breath.   Cardiovascular: Negative for chest pain.  Gastrointestinal: Negative for nausea, vomiting, abdominal pain and diarrhea.  All other systems reviewed and are negative.    Allergies  Doxycycline; Latex; Ciprofloxacin; Hydrocortisone; Neomycin; and Sulfonamide derivatives  Home Medications   Current Outpatient Rx  Name  Route  Sig  Dispense  Refill  . ACETAMINOPHEN 500 MG PO TABS   Oral   Take 500-1,000 mg by mouth every 6 (six) hours as needed. For pain         . CLONIDINE HCL 0.2 MG PO TABS   Oral   Take 0.2 mg by mouth 2 (two) times daily.          [name] FEXOFENADINE HCL 180 MG PO TABS   Oral   Take 180 mg by mouth daily as needed. For allergies         . FLUTICASONE PROPIONATE 50  MCG/ACT NA SUSP   Nasal   Place 2 sprays into the nose daily. For allergies         . LOSARTAN POTASSIUM-HCTZ 100-25 MG PO TABS   Oral   Take 1 tablet by mouth every morning.         [name] PHENYLEPHRINE HCL 10 MG PO TABS   Oral   Take 10 mg by mouth every 4 (four) hours as needed. For congestion         . FUROSEMIDE 40 MG PO TABS   Oral   Take 80 mg by mouth daily as needed. For swelling or fluid retention.           BP 188/98  Pulse 86  Temp 98.7 F (37.1 C) (Oral)  Resp 18  SpO2 98%  Physical Exam  Nursing note and vitals reviewed. Constitutional: She is oriented to person, place, and time. She appears well-developed and well-nourished. No distress.  HENT:  Head: Normocephalic.  Eyes: Conjunctivae normal and EOM are normal. Pupils are equal, round, and reactive to light.       No tenderness to palpation of  maxillary sinuses.  Neck: Normal range of motion. Neck supple.  Cardiovascular: Normal rate, regular rhythm, normal heart sounds and intact distal pulses.   Pulmonary/Chest: Effort normal and breath sounds normal. No stridor. No respiratory distress. She has no wheezes. She has no rales. She exhibits no tenderness.  Abdominal: Soft. Bowel sounds are normal. She exhibits no distension and no mass. There is no tenderness. There is no rebound and no guarding.  Musculoskeletal: Normal range of motion.  Neurological: She is alert and oriented to person, place, and time.  Psychiatric: She has a normal mood and affect.    ED Course  Procedures (including critical care time)  Labs Reviewed - No data to display Dg Chest 2 View  02/24/2012  *RADIOLOGY REPORT*  Clinical Data: Cough and fever.  CHEST - 2 VIEW  Comparison: 02/03/2011  Findings: Cardiomegaly noted with cephalization of blood flow. Thoracic spondylosis is present.  No blunted costophrenic angle to suggest pleural effusion.  Suspected linear subsegmental atelectasis noted in the left lower lobe.  IMPRESSION:  1.  Cardiomegaly with pulmonary venous hypertension. 2.  Left lower lobe subsegmental atelectasis. 3.  Thoracic spondylosis.   Original Report Authenticated By: Gaylyn Rong, M.D.      1. URI, acute       MDM  Patient was Raynaud's, congestion, productive cough.  No other associated symptoms.  At any acute process at this time.  The cough is likely secondary to postnasal drip.  Precautions given patient has excellent outpatient followup.   Pt verbalized understanding and agrees with care plan. Outpatient follow-up and return precautions given.    New Prescriptions   AMOXICILLIN (AMOXIL) 500 MG CAPSULE    Take 1 capsule (500 mg total) by mouth 3 (three) times daily.   BENZONATATE (TESSALON) 200 MG CAPSULE    Take 1 capsule (200 mg total) by mouth 3 (three) times daily as needed for cough.   LOSARTAN-HYDROCHLOROTHIAZIDE  (HYZAAR) 100-25 MG PER TABLET    Take 1 tablet by mouth daily.        Wynetta Emery, PA-C 02/27/12 1230

## 2012-02-27 NOTE — ED Provider Notes (Signed)
Medical screening examination/treatment/procedure(s) were performed by non-physician practitioner and as supervising physician I was immediately available for consultation/collaboration.  Ethelda Chick, MD 02/27/12 [phone]

## 2012-03-08 ENCOUNTER — Emergency Department (HOSPITAL_COMMUNITY): Payer: No Typology Code available for payment source

## 2012-03-08 ENCOUNTER — Encounter (HOSPITAL_COMMUNITY): Payer: Self-pay | Admitting: Emergency Medicine

## 2012-03-08 ENCOUNTER — Emergency Department (HOSPITAL_COMMUNITY)
Admission: EM | Admit: 2012-03-08 | Discharge: 2012-03-08 | Disposition: A | Discharge status: Home / Self Care | Payer: No Typology Code available for payment source | Attending: Emergency Medicine | Admitting: Emergency Medicine

## 2012-03-08 DIAGNOSIS — Z9889 Other specified postprocedural states: Secondary | ICD-10-CM | POA: Insufficient documentation

## 2012-03-08 DIAGNOSIS — G4733 Obstructive sleep apnea (adult) (pediatric): Secondary | ICD-10-CM | POA: Insufficient documentation

## 2012-03-08 DIAGNOSIS — R0602 Shortness of breath: Secondary | ICD-10-CM | POA: Insufficient documentation

## 2012-03-08 DIAGNOSIS — Z862 Personal history of diseases of the blood and blood-forming organs and certain disorders involving the immune mechanism: Secondary | ICD-10-CM | POA: Insufficient documentation

## 2012-03-08 DIAGNOSIS — IMO0002 Reserved for concepts with insufficient information to code with codable children: Secondary | ICD-10-CM | POA: Insufficient documentation

## 2012-03-08 DIAGNOSIS — I509 Heart failure, unspecified: Secondary | ICD-10-CM | POA: Insufficient documentation

## 2012-03-08 DIAGNOSIS — I2510 Atherosclerotic heart disease of native coronary artery without angina pectoris: Secondary | ICD-10-CM | POA: Insufficient documentation

## 2012-03-08 DIAGNOSIS — J45909 Unspecified asthma, uncomplicated: Secondary | ICD-10-CM | POA: Insufficient documentation

## 2012-03-08 DIAGNOSIS — R51 Headache: Secondary | ICD-10-CM | POA: Insufficient documentation

## 2012-03-08 DIAGNOSIS — I10 Essential (primary) hypertension: Secondary | ICD-10-CM | POA: Insufficient documentation

## 2012-03-08 DIAGNOSIS — J069 Acute upper respiratory infection, unspecified: Secondary | ICD-10-CM

## 2012-03-08 DIAGNOSIS — J3489 Other specified disorders of nose and nasal sinuses: Secondary | ICD-10-CM | POA: Insufficient documentation

## 2012-03-08 DIAGNOSIS — I252 Old myocardial infarction: Secondary | ICD-10-CM | POA: Insufficient documentation

## 2012-03-08 DIAGNOSIS — E785 Hyperlipidemia, unspecified: Secondary | ICD-10-CM | POA: Insufficient documentation

## 2012-03-08 DIAGNOSIS — Z8709 Personal history of other diseases of the respiratory system: Secondary | ICD-10-CM | POA: Insufficient documentation

## 2012-03-08 DIAGNOSIS — Z79899 Other long term (current) drug therapy: Secondary | ICD-10-CM | POA: Insufficient documentation

## 2012-03-08 DIAGNOSIS — Z8639 Personal history of other endocrine, nutritional and metabolic disease: Secondary | ICD-10-CM | POA: Insufficient documentation

## 2012-03-08 DIAGNOSIS — Z9981 Dependence on supplemental oxygen: Secondary | ICD-10-CM | POA: Insufficient documentation

## 2012-03-08 DIAGNOSIS — Z8739 Personal history of other diseases of the musculoskeletal system and connective tissue: Secondary | ICD-10-CM | POA: Insufficient documentation

## 2012-03-08 DIAGNOSIS — Z8669 Personal history of other diseases of the nervous system and sense organs: Secondary | ICD-10-CM | POA: Insufficient documentation

## 2012-03-08 DIAGNOSIS — Z8679 Personal history of other diseases of the circulatory system: Secondary | ICD-10-CM | POA: Insufficient documentation

## 2012-03-08 MED ORDER — AEROCHAMBER PLUS FLO-VU MEDIUM MISC
1.0000 | Freq: Once | Status: AC
  Administered 2012-03-08: 1

## 2012-03-08 MED ORDER — ALBUTEROL SULFATE HFA 108 (90 BASE) MCG/ACT IN AERS
2.0000 | INHALATION_SPRAY | RESPIRATORY_TRACT | Status: DC | PRN
  Administered 2012-03-08: 2 via RESPIRATORY_TRACT
  Filled 2012-03-08: qty 6.7

## 2012-03-08 MED ORDER — HYDROCOD POLST-CHLORPHEN POLST 10-8 MG/5ML PO LQCR: 5.0000 mL | Freq: Two times a day (BID) | ORAL | Status: DC | PRN

## 2012-03-08 NOTE — ED Notes (Signed)
Pt reports persistent, productive cough x 2 weeks. Stated that mucus is green. Denies fever. No wheezing noted

## 2012-03-08 NOTE — ED Provider Notes (Signed)
History     CSN: [id number]  Arrival date & time 03/08/12  0272   First MD Initiated Contact with Patient 03/08/12 [phone]      Chief Complaint  Patient presents with  . Cough  . Nasal Congestion    Increased congestion over last week  . Headache    (Consider location/radiation/quality/duration/timing/severity/associated sxs/prior treatment) Patient is a 52 y.o. female presenting with cough and headaches.  Cough Associated symptoms: headaches and shortness of breath   Associated symptoms: no fever and no sore throat   Headache Associated symptoms: cough and sinus pressure   Associated symptoms: no abdominal pain, no fatigue, no fever and no sore throat    patient presents with cough and headache. She's also had URI symptoms for the last week or 2. She's been on antibiotics for it. She reportedly had an x-ray in ER. She states her sputum has turned from white to green. Her fever is cleared up. She's had some sinus drainage. No chest pain. No abdominal pain. The lightheadedness or dizziness.  Past Medical History  Diagnosis Date  . Hypokalemia   . CHF (congestive heart failure)   . Unspecified adverse effect of unspecified drug, medicinal and biological substance   . Otitis media   . External otitis   . Sinusitis   . Plantar fasciitis   . Family history of ischemic heart disease   . Common migraine   . Morbid obesity   . Hyperlipidemia   . OSA (obstructive sleep apnea)   . Carpal tunnel syndrome   . Hypertension   . CAD (coronary artery disease)   . Asthma   . Allergic rhinitis   . Routine general medical examination at a health care facility   . Shoulder and upper arm, insect bite, nonvenomous, without mention of infection(912.4)   . Unspecified adverse effect of unspecified drug, medicinal and biological substance   . Acute serous otitis media   . Tubal ligation status   . Old myocardial infarction   . CPAP (continuous positive airway pressure) dependence   . Sleep  apnea     Past Surgical History  Procedure Laterality Date  . Tubal ligation    . Tonsillectomy    . Uvulopalatopharyngoplasty    . Cesarean section    . Nasal sinus surgery    . Partial hysterectomy      Family History  Problem Relation Age of Onset  . Coronary artery disease      1st degree relatvie <50  . Breast cancer      aunt- ? paternal or maternal  . Asthma Sister   . Heart disease Mother     History  Substance Use Topics  . Smoking status: Never Smoker   . Smokeless tobacco: Never Used  . Alcohol Use: No    OB History   Grav Para Term Preterm Abortions TAB SAB Ect Mult Living                  Review of Systems  Constitutional: Negative for fever and fatigue.  HENT: Positive for sinus pressure. Negative for nosebleeds, sore throat, mouth sores and trouble swallowing.   Respiratory: Positive for cough and shortness of breath.   Gastrointestinal: Negative for abdominal pain.  Genitourinary: Negative for flank pain.  Neurological: Positive for headaches.    Allergies  Doxycycline; Latex; Ciprofloxacin; Hydrocortisone; Neomycin; and Sulfonamide derivatives  Home Medications   Current Outpatient Rx  Name  Route  Sig  Dispense  Refill  . acetaminophen (  TYLENOL) 500 MG tablet   Oral   Take 500-1,000 mg by mouth every 6 (six) hours as needed. For pain         . amoxicillin (AMOXIL) 500 MG capsule   Oral   Take 1 capsule (500 mg total) by mouth 3 (three) times daily.   21 capsule   0   . benzonatate (TESSALON) 200 MG capsule   Oral   Take 1 capsule (200 mg total) by mouth 3 (three) times daily as needed for cough.   20 capsule   0   . cloNIDine (CATAPRES) 0.2 MG tablet   Oral   Take 0.2 mg by mouth 2 (two) times daily.          . fexofenadine (ALLEGRA) 180 MG tablet   Oral   Take 180 mg by mouth daily as needed. For allergies         . fluticasone (FLONASE) 50 MCG/ACT nasal spray   Nasal   Place 2 sprays into the nose daily. For  allergies         . furosemide (LASIX) 40 MG tablet   Oral   Take 80 mg by mouth daily as needed. For swelling or fluid retention.         [name] losartan-hydrochlorothiazide (HYZAAR) 100-25 MG per tablet   Oral   Take 1 tablet by mouth every morning.         [name] losartan-hydrochlorothiazide (HYZAAR) 100-25 MG per tablet   Oral   Take 1 tablet by mouth daily.   30 tablet   0   . phenylephrine (SUDAFED PE) 10 MG TABS   Oral   Take 10 mg by mouth every 4 (four) hours as needed. For congestion           BP 162/76  Pulse 70  Temp(Src) 98.2 F (36.8 C)  SpO2 97%  Physical Exam  Constitutional: She is oriented to person, place, and time. She appears well-developed and well-nourished.  Patient is morbidly obese  HENT:  Head: Normocephalic and atraumatic.  Mouth/Throat: Oropharynx is clear and moist. No oropharyngeal exudate.  Mild tenderness over sinuses.  Eyes: Pupils are equal, round, and reactive to light.  Neck: Normal range of motion. Neck supple.  Cardiovascular: Normal rate and regular rhythm.   Pulmonary/Chest: Effort normal and breath sounds normal.  Abdominal: Soft. Bowel sounds are normal.  Musculoskeletal: Normal range of motion.  Neurological: She is alert and oriented to person, place, and time.    ED Course  Procedures (including critical care time)  Labs Reviewed - No data to display No results found.   No diagnosis found.    MDM  Patient presented after a URI symptoms for the last 2 weeks. It has been on antibiotics. She continues to feel bad. Mild tenderness over the sinuses also. X-ray was repeated and does not show pneumonia. She'll be discharged home follow up with her primary care Dr. No antibiotics at this time, however patient was given an inhaler.        Juliet Rude. Rubin Payor, MD 03/08/12 1534

## 2012-03-09 ENCOUNTER — Telehealth: Payer: Self-pay | Admitting: Pulmonary Disease

## 2012-03-09 NOTE — Telephone Encounter (Signed)
Called, spoke with pt.  Pt states she has been seen a few times recently in the ER d/t recurrent URIs.  She is requesting OV with [facility] this week to follow up on this.  She was last seen by [facility] on Feb 26, 2010.  We have scheduled her to [facility] on tomorrow, Mar 10, 2012 at 9am -- pt aware and voiced no further questions or concerns at this time.

## 2012-03-10 ENCOUNTER — Ambulatory Visit (INDEPENDENT_AMBULATORY_CARE_PROVIDER_SITE_OTHER): Payer: No Typology Code available for payment source | Admitting: Pulmonary Disease

## 2012-03-10 ENCOUNTER — Encounter: Payer: Self-pay | Admitting: Pulmonary Disease

## 2012-03-10 VITALS — BP 130/92 | HR 73 | Temp 98.1°F | Ht 63.0 in | Wt 306.8 lb

## 2012-03-10 DIAGNOSIS — J329 Chronic sinusitis, unspecified: Secondary | ICD-10-CM | POA: Insufficient documentation

## 2012-03-10 DIAGNOSIS — J019 Acute sinusitis, unspecified: Secondary | ICD-10-CM

## 2012-03-10 HISTORY — DX: Chronic sinusitis, unspecified: J32.9

## 2012-03-10 MED ORDER — AMOXICILLIN-POT CLAVULANATE 875-125 MG PO TABS: 1.0000 | ORAL_TABLET | Freq: Two times a day (BID) | ORAL | Status: DC

## 2012-03-10 NOTE — Assessment & Plan Note (Signed)
The patient's history is most consistent with another episode of acute on chronic sinusitis.  She has had multiple ER visits for the same, but has never been treated with an appropriately extended course of antibiotics.  Her lungs are totally clear today on exam, and her history has never suggested asthma.  I tried to get spirometry today for airflow documentation, that she was not able to follow instructions properly despite numerous attempts by different nurses.  I have also discussed with her the importance of using saline rinses.  Finally, I have asked her to followup with her otolaryngologist, and she was under the assumption that he left town because his office moved.

## 2012-03-10 NOTE — Addendum Note (Signed)
Addended by: Nita Sells on: 03/10/2012 09:56 AM   Modules accepted: Orders

## 2012-03-10 NOTE — Progress Notes (Signed)
  Subjective:    Patient ID: [name], female    DOB: 1960-04-30, 52 y.o.   MRN: [id number]  HPI The patient comes in today for acute sick visit.  She was last seen in 2012 where she was having recurrent episodes of acute sinusitis and bronchitis.  She had a CT of her sinuses done which showed acute and chronic sinusitis.  She was referred to otolaryngology, and asked to followup with me in 2 weeks for pulmonary function studies to evaluate for possible airways disease.  The patient never made it back to her appointment, and never followed up with her otolaryngologist Dr. Lazarus Salines.  Most recently, she has had recurrent ER visits with acute sinusitis, and unfortunately has only been treated with 5-7 days of antibiotics.  She was in the emergency room 2 days ago with similar symptoms, and a chest x-ray showed prominent bronchovascular markings, but no acute process.  She's actually had a CT of her chest in January of last year that showed no parenchymal lung disease, and therefore this is not an interstitial process.  The patient is complaining of purulence from her nose, sinus pressure, as well as mild epistaxis.  She is having significant postnasal drip collecting in her throat, and describes classic upper airway pseudo-wheezing.  She is having some chest congestion, and is coughing up purulent mucus as well.  She does have increased shortness of breath, but is morbidly obese.   Review of Systems  Constitutional: Negative for fever and unexpected weight change.  HENT: Negative for ear pain, nosebleeds, congestion, sore throat, rhinorrhea, sneezing, trouble swallowing, dental problem, postnasal drip and sinus pressure.   Eyes: Negative for redness and itching.  Respiratory: Positive for cough, chest tightness, shortness of breath and wheezing.   Cardiovascular: Negative for palpitations and leg swelling.  Gastrointestinal: Negative for nausea and vomiting.  Genitourinary: Negative for dysuria.   Musculoskeletal: Negative for joint swelling.  Skin: Negative for rash.  Neurological: Negative for headaches.  Hematological: Does not bruise/bleed easily.  Psychiatric/Behavioral: Negative for dysphoric mood. The patient is not nervous/anxious.        Objective:   Physical Exam Morbidly obese female in no acute distress Nose with erythematous and edematous mucosa, as well as purulent secretions in the airway. Oropharynx clear without exudates Neck without lymphadenopathy or thyromegaly Chest totally clear to auscultation with no true wheezing Cardiac exam with regular rate and rhythm Lower extremities with mild edema, no cyanosis Alert and oriented, moves all 4 extremities.       Assessment & Plan:

## 2012-03-10 NOTE — Patient Instructions (Addendum)
Will treat with augmentin 875mg  one in am and pm with a large glass of water for 2 weeks. Do neilmed sinus rinses am and pm for next 2 weeks Can stay on mucinex if it helps. You need to schedule an apptm with Dr. Lazarus Salines to keep an eye on your chronic sinus disease.  followup with me in one year for your sleep apnea, or sooner if having worsening breathing issues.

## 2012-03-12 ENCOUNTER — Other Ambulatory Visit: Payer: Self-pay | Admitting: Otolaryngology

## 2012-03-12 DIAGNOSIS — J329 Chronic sinusitis, unspecified: Secondary | ICD-10-CM

## 2012-03-18 ENCOUNTER — Ambulatory Visit
Admission: RE | Admit: 2012-03-18 | Discharge: 2012-03-18 | Disposition: A | Discharge status: Home / Self Care | Payer: No Typology Code available for payment source | Source: Ambulatory Visit | Attending: Otolaryngology | Admitting: Otolaryngology

## 2012-03-18 DIAGNOSIS — J329 Chronic sinusitis, unspecified: Secondary | ICD-10-CM

## 2012-06-29 ENCOUNTER — Other Ambulatory Visit: Payer: Self-pay

## 2012-06-29 MED ORDER — CLONIDINE HCL 0.2 MG PO TABS: 0.2000 mg | ORAL_TABLET | Freq: Two times a day (BID) | ORAL | Status: DC

## 2012-06-29 MED ORDER — FUROSEMIDE 40 MG PO TABS: 80.0000 mg | ORAL_TABLET | Freq: Every day | ORAL | Status: DC | PRN

## 2012-06-29 NOTE — Telephone Encounter (Signed)
..   Requested Prescriptions   Signed Prescriptions Disp Refills  . cloNIDine (CATAPRES) 0.2 MG tablet 60 tablet 6    Sig: Take 1 tablet (0.2 mg total) by mouth 2 (two) times daily.    Authorizing Provider: Rollene Rotunda    Ordering User: Christella Hartigan, Matin Mattioli Judie Petit

## 2012-07-03 ENCOUNTER — Telehealth: Payer: Self-pay | Admitting: Physician Assistant

## 2012-07-03 NOTE — Telephone Encounter (Signed)
Pt called the answering service re: bilateral leg swelling. She reports that her legs became more swollen 3-4 days ago. She takes Lasix PRN for this reason. She denies associates SOB/DOE, PND or orthopnea. She took Lasix as prescribed (80mg ) x 4 days and her swelling improved; however now she notices her legs are weak and "burning." She did not supplement with KCl. She is on Hyzaar. Per prior telephone notes, she has had LE pain in the setting of hypokalemia in the past. Will call in KDur to pharmacy (prior Rx expired), advise to take 40 mEq today and tomorrow, then PRN with Lasix and check BMET on Monday.    Jacqulyn Bath, PA-C 07/03/2012 7:41 PM

## 2012-07-06 ENCOUNTER — Other Ambulatory Visit (INDEPENDENT_AMBULATORY_CARE_PROVIDER_SITE_OTHER): Payer: No Typology Code available for payment source

## 2012-07-06 ENCOUNTER — Telehealth: Payer: Self-pay | Admitting: Cardiology

## 2012-07-06 DIAGNOSIS — Z79899 Other long term (current) drug therapy: Secondary | ICD-10-CM

## 2012-07-06 DIAGNOSIS — M7989 Other specified soft tissue disorders: Secondary | ICD-10-CM

## 2012-07-06 NOTE — Telephone Encounter (Signed)
Pt is aware and will be coming into the office today for bloodwork.

## 2012-07-06 NOTE — Telephone Encounter (Signed)
New problem    Per after hour voice mail     Patient need to have lab work done today - BMET . Call the home left message on home voice mail.

## 2012-07-07 LAB — BASIC METABOLIC PANEL
CO2: 29 mEq/L (ref 19–32)
Calcium: 9.6 mg/dL (ref 8.4–10.5)
Chloride: 99 mEq/L (ref 96–112)
Creatinine, Ser: 1 mg/dL (ref 0.4–1.2)
GFR: 77.07 mL/min (ref >60.00)
Potassium: 2.8 mEq/L — CL (ref 3.5–5.1)

## 2012-07-09 ENCOUNTER — Other Ambulatory Visit: Payer: Self-pay | Admitting: *Deleted

## 2012-07-09 DIAGNOSIS — E876 Hypokalemia: Secondary | ICD-10-CM

## 2012-07-13 ENCOUNTER — Other Ambulatory Visit: Payer: No Typology Code available for payment source

## 2012-07-14 ENCOUNTER — Other Ambulatory Visit: Payer: Self-pay | Admitting: *Deleted

## 2012-07-14 ENCOUNTER — Telehealth: Payer: Self-pay | Admitting: Cardiology

## 2012-07-14 ENCOUNTER — Other Ambulatory Visit (INDEPENDENT_AMBULATORY_CARE_PROVIDER_SITE_OTHER): Payer: No Typology Code available for payment source

## 2012-07-14 DIAGNOSIS — E876 Hypokalemia: Secondary | ICD-10-CM

## 2012-07-14 LAB — BASIC METABOLIC PANEL
Calcium: 9.4 mg/dL (ref 8.4–10.5)
Creatinine, Ser: 0.8 mg/dL (ref 0.4–1.2)

## 2012-07-14 MED ORDER — POTASSIUM CHLORIDE CRYS ER 20 MEQ PO TBCR: EXTENDED_RELEASE_TABLET | ORAL | Status: DC

## 2012-07-14 NOTE — Telephone Encounter (Signed)
New Problem  Pt is calling wanting the results of her lab work.

## 2012-07-14 NOTE — Telephone Encounter (Signed)
Spoke with pt, she reports her tablets are 20 meq and she is taking 8 tablets everyday. Per dr hoichrein she will increase by 20 meq daily. New script sent to pharm. She will return on Friday this week for repeat blood work.

## 2012-07-14 NOTE — Telephone Encounter (Signed)
Patient called for the lab results. Pt K+ level was 3.0 on today's results. Pt  has been taken K-Dur 160 mg  ( takes 8/ 20 mEq pills ) daily  since 07/06/12 as she was told per nurse.. Pt would like to have the Potassium medication to be given IV. Pt  was told by the ER that it will be the only way that it will work fast for her.

## 2012-07-17 ENCOUNTER — Other Ambulatory Visit (INDEPENDENT_AMBULATORY_CARE_PROVIDER_SITE_OTHER): Payer: No Typology Code available for payment source

## 2012-07-17 DIAGNOSIS — E876 Hypokalemia: Secondary | ICD-10-CM

## 2012-07-17 LAB — BASIC METABOLIC PANEL
GFR: 105.32 mL/min (ref >60.00)
Potassium: 3.4 mEq/L — ABNORMAL LOW (ref 3.5–5.1)
Sodium: 139 mEq/L (ref 135–145)

## 2012-08-04 ENCOUNTER — Other Ambulatory Visit: Payer: Self-pay

## 2012-08-04 MED ORDER — LOSARTAN POTASSIUM-HCTZ 100-25 MG PO TABS: 1.0000 | ORAL_TABLET | Freq: Every day | ORAL | Status: DC

## 2012-08-05 ENCOUNTER — Other Ambulatory Visit: Payer: Self-pay

## 2012-08-05 MED ORDER — LOSARTAN POTASSIUM-HCTZ 100-25 MG PO TABS: 1.0000 | ORAL_TABLET | Freq: Every day | ORAL | Status: DC

## 2012-08-23 ENCOUNTER — Encounter (HOSPITAL_COMMUNITY): Payer: Self-pay

## 2012-08-23 ENCOUNTER — Emergency Department (HOSPITAL_COMMUNITY)
Admission: EM | Admit: 2012-08-23 | Discharge: 2012-08-23 | Disposition: A | Discharge status: Home / Self Care | Payer: No Typology Code available for payment source | Attending: Emergency Medicine | Admitting: Emergency Medicine

## 2012-08-23 DIAGNOSIS — Z8679 Personal history of other diseases of the circulatory system: Secondary | ICD-10-CM | POA: Insufficient documentation

## 2012-08-23 DIAGNOSIS — Z8639 Personal history of other endocrine, nutritional and metabolic disease: Secondary | ICD-10-CM | POA: Insufficient documentation

## 2012-08-23 DIAGNOSIS — Z8739 Personal history of other diseases of the musculoskeletal system and connective tissue: Secondary | ICD-10-CM | POA: Insufficient documentation

## 2012-08-23 DIAGNOSIS — Z8709 Personal history of other diseases of the respiratory system: Secondary | ICD-10-CM | POA: Insufficient documentation

## 2012-08-23 DIAGNOSIS — IMO0002 Reserved for concepts with insufficient information to code with codable children: Secondary | ICD-10-CM | POA: Insufficient documentation

## 2012-08-23 DIAGNOSIS — R5381 Other malaise: Secondary | ICD-10-CM | POA: Insufficient documentation

## 2012-08-23 DIAGNOSIS — Z862 Personal history of diseases of the blood and blood-forming organs and certain disorders involving the immune mechanism: Secondary | ICD-10-CM | POA: Insufficient documentation

## 2012-08-23 DIAGNOSIS — Z8669 Personal history of other diseases of the nervous system and sense organs: Secondary | ICD-10-CM | POA: Insufficient documentation

## 2012-08-23 DIAGNOSIS — J029 Acute pharyngitis, unspecified: Secondary | ICD-10-CM | POA: Insufficient documentation

## 2012-08-23 DIAGNOSIS — Z9889 Other specified postprocedural states: Secondary | ICD-10-CM | POA: Insufficient documentation

## 2012-08-23 DIAGNOSIS — I252 Old myocardial infarction: Secondary | ICD-10-CM | POA: Insufficient documentation

## 2012-08-23 DIAGNOSIS — G4733 Obstructive sleep apnea (adult) (pediatric): Secondary | ICD-10-CM | POA: Insufficient documentation

## 2012-08-23 DIAGNOSIS — I2510 Atherosclerotic heart disease of native coronary artery without angina pectoris: Secondary | ICD-10-CM | POA: Insufficient documentation

## 2012-08-23 DIAGNOSIS — Z79899 Other long term (current) drug therapy: Secondary | ICD-10-CM | POA: Insufficient documentation

## 2012-08-23 DIAGNOSIS — R5383 Other fatigue: Secondary | ICD-10-CM | POA: Insufficient documentation

## 2012-08-23 DIAGNOSIS — I509 Heart failure, unspecified: Secondary | ICD-10-CM | POA: Insufficient documentation

## 2012-08-23 DIAGNOSIS — Z9981 Dependence on supplemental oxygen: Secondary | ICD-10-CM | POA: Insufficient documentation

## 2012-08-23 NOTE — ED Notes (Signed)
Patient presents today with swollen throat and swollen lymph nodes. Throat is hot and tender to the touch. Has been coughing up thick yellow sputum. Also complaining of ear ache. Ears have a full feeling. Patient has hx of chronic sinus infections. Complains of pain when swallowing

## 2012-08-23 NOTE — ED Provider Notes (Signed)
CSN: [id number]     Arrival date & time 08/23/12  0714 History     First MD Initiated Contact with Patient 08/23/12 [phone]     Chief Complaint  Patient presents with  . Sore Throat   (Consider location/radiation/quality/duration/timing/severity/associated sxs/prior Treatment) HPI Comments: Patient presents with a four-day history of sore throat. She states it hurts when she swallows. She denies any shortness of breath or significant throat swelling. She feels like her glands are swollen in her neck. She denies any runny nose or nasal congestion. She denies any chest congestion. She states when she clears her throat she had some thick white mucus. She had a subjective fever yesterday. She denies any rashes. She denies any nausea vomiting or myalgias.  Patient is a 52 y.o. female presenting with pharyngitis.  Sore Throat Pertinent negatives include no chest pain, no abdominal pain, no headaches and no shortness of breath.    Past Medical History  Diagnosis Date  . Hypokalemia   . CHF (congestive heart failure)   . Unspecified adverse effect of unspecified drug, medicinal and biological substance   . Otitis media   . External otitis   . Sinusitis   . Plantar fasciitis   . Family history of ischemic heart disease   . Common migraine   . Morbid obesity   . Hyperlipidemia   . OSA (obstructive sleep apnea)   . Carpal tunnel syndrome   . Hypertension   . CAD (coronary artery disease)   . Allergic rhinitis   . Routine general medical examination at a health care facility   . Shoulder and upper arm, insect bite, nonvenomous, without mention of infection(912.4)   . Unspecified adverse effect of unspecified drug, medicinal and biological substance   . Acute serous otitis media   . Tubal ligation status   . Old myocardial infarction   . CPAP (continuous positive airway pressure) dependence   . Sleep apnea    Past Surgical History  Procedure Laterality Date  . Tubal ligation    .  Tonsillectomy    . Uvulopalatopharyngoplasty    . Cesarean section    . Nasal sinus surgery    . Partial hysterectomy     Family History  Problem Relation Age of Onset  . Coronary artery disease      1st degree relatvie <50  . Breast cancer      aunt- ? paternal or maternal  . Asthma Sister   . Heart disease Mother    History  Substance Use Topics  . Smoking status: Never Smoker   . Smokeless tobacco: Never Used  . Alcohol Use: No   OB History   Grav Para Term Preterm Abortions TAB SAB Ect Mult Living                 Review of Systems  Constitutional: Positive for fatigue. Negative for fever, chills and diaphoresis.  HENT: Positive for sore throat. Negative for congestion, rhinorrhea, sneezing, trouble swallowing and voice change.   Eyes: Negative.   Respiratory: Negative for cough, chest tightness and shortness of breath.   Cardiovascular: Negative for chest pain and leg swelling.  Gastrointestinal: Negative for nausea, vomiting, abdominal pain, diarrhea and blood in stool.  Genitourinary: Negative for frequency, hematuria, flank pain and difficulty urinating.  Musculoskeletal: Negative for back pain and arthralgias.  Skin: Negative for rash.  Neurological: Negative for dizziness, speech difficulty, weakness, numbness and headaches.    Allergies  Doxycycline; Latex; Ciprofloxacin; Fish allergy; Hydrocortisone; Neomycin; and  Sulfonamide derivatives  Home Medications   Current Outpatient Rx  Name  Route  Sig  Dispense  Refill  . albuterol (PROVENTIL HFA;VENTOLIN HFA) 108 (90 BASE) MCG/ACT inhaler   Inhalation   Inhale 2 puffs into the lungs every 4 (four) hours as needed for wheezing.         [name] amoxicillin-clavulanate (AUGMENTIN) 875-125 MG per tablet   Oral   Take 1 tablet by mouth 2 (two) times daily. For 2 weeks.   28 tablet   0   . chlorpheniramine-HYDROcodone (TUSSIONEX PENNKINETIC ER) 10-8 MG/5ML LQCR   Oral   Take 5 mLs by mouth every 12 (twelve)  hours as needed.   140 mL   0   . cloNIDine (CATAPRES) 0.2 MG tablet   Oral   Take 1 tablet (0.2 mg total) by mouth 2 (two) times daily.   60 tablet   6   . dextromethorphan-guaiFENesin (MUCINEX DM) 30-600 MG per 12 hr tablet   Oral   Take 1 tablet by mouth every 12 (twelve) hours.         . fluticasone (FLONASE) 50 MCG/ACT nasal spray   Nasal   Place 2 sprays into the nose daily. For allergies         . furosemide (LASIX) 40 MG tablet   Oral   Take 2 tablets (80 mg total) by mouth daily as needed. For swelling or fluid retention.   30 tablet   6   . losartan-hydrochlorothiazide (HYZAAR) 100-25 MG per tablet   Oral   Take 1 tablet by mouth daily.   90 tablet   0     .Marland KitchenPatient needs to contact office to schedule  App ...   . phenylephrine (SUDAFED PE) 10 MG TABS   Oral   Take 10 mg by mouth every 4 (four) hours as needed. For congestion         . potassium chloride SA (K-DUR,KLOR-CON) 20 MEQ tablet      Take 5 tablets every morning and 4 tablets every afternoon   270 tablet   12    BP 115/53  Pulse 93  Temp(Src) 99.2 F (37.3 C) (Oral)  Resp 15  SpO2 99% Physical Exam  Constitutional: She is oriented to person, place, and time. She appears well-developed and well-nourished.  HENT:  Head: Normocephalic and atraumatic.  Eyes: Pupils are equal, round, and reactive to light.  Neck: Normal range of motion. Neck supple.  Mild erythema to the posterior pharynx. There is no exudates. Uvula is midline. There is no trismus.  Cardiovascular: Normal rate, regular rhythm and normal heart sounds.   Pulmonary/Chest: Effort normal and breath sounds normal. No respiratory distress. She has no wheezes. She has no rales. She exhibits no tenderness.  Abdominal: Soft. Bowel sounds are normal. There is no tenderness. There is no rebound and no guarding.  Musculoskeletal: Normal range of motion. She exhibits no edema.  Lymphadenopathy:    She has no cervical adenopathy.   Neurological: She is alert and oriented to person, place, and time.  Skin: Skin is warm and dry. No rash noted.  Psychiatric: She has a normal mood and affect.    ED Course   Procedures (including critical care time)  Results for orders placed during the hospital encounter of 08/23/12  RAPID STREP SCREEN      Result Value Range   Streptococcus, Group A Screen (Direct) NEGATIVE  NEGATIVE   No results found.   No results found. 1. Pharyngitis  MDM  Appetite is negative. Throat culture will be done. Her symptoms are more consistent with a viral pharyngitis. She has no evidence of airway compromise or significant edema. There is no unilateral swelling suspicious for peritonsillar abscess. She was discharged home advised in symptomatic care. She was advised to followup with her primary care physician within 2-3 days if her symptoms are not improving.  Rolan Bucco, MD 08/23/12 0900

## 2012-08-23 NOTE — ED Notes (Signed)
MD at bedside. 

## 2012-08-25 LAB — CULTURE, GROUP A STREP

## 2012-09-29 ENCOUNTER — Other Ambulatory Visit: Payer: Self-pay | Admitting: *Deleted

## 2012-09-29 MED ORDER — LOSARTAN POTASSIUM-HCTZ 100-25 MG PO TABS: 1.0000 | ORAL_TABLET | Freq: Every day | ORAL | Status: DC

## 2012-10-18 ENCOUNTER — Emergency Department (HOSPITAL_COMMUNITY)
Admission: EM | Admit: 2012-10-18 | Discharge: 2012-10-18 | Disposition: A | Discharge status: Home / Self Care | Payer: No Typology Code available for payment source | Attending: Emergency Medicine | Admitting: Emergency Medicine

## 2012-10-18 ENCOUNTER — Encounter (HOSPITAL_COMMUNITY): Payer: Self-pay

## 2012-10-18 DIAGNOSIS — H9209 Otalgia, unspecified ear: Secondary | ICD-10-CM | POA: Insufficient documentation

## 2012-10-18 DIAGNOSIS — I509 Heart failure, unspecified: Secondary | ICD-10-CM | POA: Insufficient documentation

## 2012-10-18 DIAGNOSIS — R059 Cough, unspecified: Secondary | ICD-10-CM | POA: Insufficient documentation

## 2012-10-18 DIAGNOSIS — J019 Acute sinusitis, unspecified: Secondary | ICD-10-CM

## 2012-10-18 DIAGNOSIS — G4733 Obstructive sleep apnea (adult) (pediatric): Secondary | ICD-10-CM | POA: Insufficient documentation

## 2012-10-18 DIAGNOSIS — E785 Hyperlipidemia, unspecified: Secondary | ICD-10-CM | POA: Insufficient documentation

## 2012-10-18 DIAGNOSIS — I10 Essential (primary) hypertension: Secondary | ICD-10-CM | POA: Insufficient documentation

## 2012-10-18 DIAGNOSIS — IMO0002 Reserved for concepts with insufficient information to code with codable children: Secondary | ICD-10-CM | POA: Insufficient documentation

## 2012-10-18 DIAGNOSIS — Z8709 Personal history of other diseases of the respiratory system: Secondary | ICD-10-CM | POA: Insufficient documentation

## 2012-10-18 DIAGNOSIS — Z91040 Latex allergy status: Secondary | ICD-10-CM | POA: Insufficient documentation

## 2012-10-18 DIAGNOSIS — R05 Cough: Secondary | ICD-10-CM | POA: Insufficient documentation

## 2012-10-18 DIAGNOSIS — I2510 Atherosclerotic heart disease of native coronary artery without angina pectoris: Secondary | ICD-10-CM | POA: Insufficient documentation

## 2012-10-18 DIAGNOSIS — Z79899 Other long term (current) drug therapy: Secondary | ICD-10-CM | POA: Insufficient documentation

## 2012-10-18 DIAGNOSIS — E876 Hypokalemia: Secondary | ICD-10-CM | POA: Insufficient documentation

## 2012-10-18 MED ORDER — LEVOFLOXACIN 500 MG PO TABS: 500.0000 mg | ORAL_TABLET | Freq: Every day | ORAL | Status: DC

## 2012-10-18 MED ORDER — CLINDAMYCIN HCL 150 MG PO CAPS: 150.0000 mg | ORAL_CAPSULE | Freq: Three times a day (TID) | ORAL | Status: DC

## 2012-10-18 NOTE — ED Notes (Signed)
She c/o facial (sinus area) pain and pressure x 1 month.  She also mentions occasional, minimally productive cough.  She denies fever/n/v/d and is in no distress.

## 2012-10-18 NOTE — ED Provider Notes (Addendum)
CSN: [id number]     Arrival date & time 10/18/12  4098 History   First MD Initiated Contact with Patient 10/18/12 0747     Chief Complaint  Patient presents with  . Facial Pain   (Consider location/radiation/quality/duration/timing/severity/associated sxs/prior Treatment) HPI Comments: 52 year old female with history of chronic sinusitis presents with worsening sinus pressure/facial pain x1 month. She states she was seen here one month ago and followed with her PCP but her symptoms keep getting worse. She's been having yellow nasal discharge during this time as well. She produces a minimally productive yellow cough. Denies any wheezing or shortness of breath. She states that she is now feeling ear fullness bilaterally as well. Denies any sore throat or neck pain. When asked why her blood pressure is so high she states she always gets elevated blood pressure with being acutely sick. She denies any headaches, neck pain, chest pain, shortness of breath, abdominal pain, or numbness or weakness. She did not take her medicines this morning for her blood pressure  The history is provided by the patient.    Past Medical History  Diagnosis Date  . Hypokalemia   . CHF (congestive heart failure)   . Unspecified adverse effect of unspecified drug, medicinal and biological substance   . Otitis media   . External otitis   . Sinusitis   . Plantar fasciitis   . Family history of ischemic heart disease   . Common migraine   . Morbid obesity   . Hyperlipidemia   . OSA (obstructive sleep apnea)   . Carpal tunnel syndrome   . Hypertension   . CAD (coronary artery disease)   . Allergic rhinitis   . Routine general medical examination at a health care facility   . Shoulder and upper arm, insect bite, nonvenomous, without mention of infection(912.4)   . Unspecified adverse effect of unspecified drug, medicinal and biological substance   . Acute serous otitis media   . Tubal ligation status   . Old  myocardial infarction   . CPAP (continuous positive airway pressure) dependence   . Sleep apnea    Past Surgical History  Procedure Laterality Date  . Tubal ligation    . Tonsillectomy    . Uvulopalatopharyngoplasty    . Cesarean section    . Nasal sinus surgery    . Partial hysterectomy     Family History  Problem Relation Age of Onset  . Coronary artery disease      1st degree relatvie <50  . Breast cancer      aunt- ? paternal or maternal  . Asthma Sister   . Heart disease Mother    History  Substance Use Topics  . Smoking status: Never Smoker   . Smokeless tobacco: Never Used  . Alcohol Use: No   OB History   Grav Para Term Preterm Abortions TAB SAB Ect Mult Living                 Review of Systems  Constitutional: Negative for fever and chills.  HENT: Positive for ear pain, congestion, rhinorrhea and sinus pressure. Negative for sore throat, facial swelling, trouble swallowing and neck pain.   Eyes: Negative for visual disturbance.  Respiratory: Positive for cough. Negative for shortness of breath.   Cardiovascular: Negative for chest pain and leg swelling.  Gastrointestinal: Negative for vomiting and abdominal pain.  Genitourinary: Negative for dysuria.  Musculoskeletal: Negative for back pain.  Neurological: Negative for weakness, numbness and headaches.  All other systems  reviewed and are negative.    Allergies  Doxycycline; Latex; Ciprofloxacin; Fish allergy; Hydrocortisone; Neomycin; and Sulfonamide derivatives  Home Medications   Current Outpatient Rx  Name  Route  Sig  Dispense  Refill  . albuterol (PROVENTIL HFA;VENTOLIN HFA) 108 (90 BASE) MCG/ACT inhaler   Inhalation   Inhale 2 puffs into the lungs every 4 (four) hours as needed for wheezing.         [name] amoxicillin-clavulanate (AUGMENTIN) 875-125 MG per tablet   Oral   Take 1 tablet by mouth 2 (two) times daily. For 2 weeks.   28 tablet   0   . chlorpheniramine-HYDROcodone (TUSSIONEX  PENNKINETIC ER) 10-8 MG/5ML LQCR   Oral   Take 5 mLs by mouth every 12 (twelve) hours as needed.   140 mL   0   . cloNIDine (CATAPRES) 0.2 MG tablet   Oral   Take 1 tablet (0.2 mg total) by mouth 2 (two) times daily.   60 tablet   6   . dextromethorphan-guaiFENesin (MUCINEX DM) 30-600 MG per 12 hr tablet   Oral   Take 1 tablet by mouth every 12 (twelve) hours.         . fluticasone (FLONASE) 50 MCG/ACT nasal spray   Nasal   Place 2 sprays into the nose daily. For allergies         . furosemide (LASIX) 40 MG tablet   Oral   Take 2 tablets (80 mg total) by mouth daily as needed. For swelling or fluid retention.   30 tablet   6   . losartan-hydrochlorothiazide (HYZAAR) 100-25 MG per tablet   Oral   Take 1 tablet by mouth daily.   30 tablet   0     .Marland KitchenPatient needs to contact office to schedule  App ...   . phenylephrine (SUDAFED PE) 10 MG TABS   Oral   Take 10 mg by mouth every 4 (four) hours as needed. For congestion         . potassium chloride SA (K-DUR,KLOR-CON) 20 MEQ tablet      Take 5 tablets every morning and 4 tablets every afternoon   270 tablet   12    BP 221/101  Pulse 71  Temp(Src) 98.1 F (36.7 C) (Oral)  Resp 20  SpO2 98% Physical Exam  Nursing note and vitals reviewed. Constitutional: She is oriented to person, place, and time. She appears well-developed and well-nourished. No distress.  HENT:  Head: Normocephalic and atraumatic.  Right Ear: External ear normal.  Left Ear: External ear normal.  Nose: Right sinus exhibits maxillary sinus tenderness and frontal sinus tenderness. Left sinus exhibits maxillary sinus tenderness and frontal sinus tenderness.  Mouth/Throat: Uvula is midline, oropharynx is clear and moist and mucous membranes are normal. No oropharyngeal exudate.  Eyes: EOM are normal. Pupils are equal, round, and reactive to light. Right eye exhibits no discharge. Left eye exhibits no discharge.  Cardiovascular: Normal rate,  regular rhythm and normal heart sounds.   Pulmonary/Chest: Effort normal and breath sounds normal. She has no wheezes. She has no rales.  Abdominal: Soft. There is no tenderness.  Neurological: She is alert and oriented to person, place, and time. No cranial nerve deficit or sensory deficit. She exhibits normal muscle tone. Gait normal. GCS eye subscore is 4. GCS verbal subscore is 5. GCS motor subscore is 6.  Skin: Skin is warm and dry.    ED Course  Procedures (including critical care time) Labs Review Labs Reviewed -  No data to display Imaging Review No results found.  MDM   1. Acute sinusitis   2. Hypertension    Patient symptoms are consistent with acute on chronic sinusitis. Patient is followed by ENT she gets this often. She states that Dr. Lazarus Salines told her never to receive amoxicillin or Augmentin as she is resistant. Last time she was treated with Clinda and Levaquin with resolution of her symptoms. Will treat with the same regimen for 2 weeks. She states that she is able to take Levaquin without difficulties despite having a Cipro allergy. Also follow up with her ENT during this time. He has no signs of lower respiratory tract disease, has normal lung exam and has no shortness of breath or wheezing. As for her hypertension she states that she did not take her medicines this morning. She denies any signs of endorgan disease such as visual symptoms, headaches, chest pain, abdominal pain, or urinary symptoms. She has a normal exam here, I recommended that she take her morning but pressure medicines as it is possible. If you have any of these other symptoms though she should return immediately to the ER.  Audree Camel, MD 10/18/12 0830

## 2012-10-19 ENCOUNTER — Other Ambulatory Visit: Payer: Self-pay | Admitting: *Deleted

## 2012-10-19 MED ORDER — LOSARTAN POTASSIUM-HCTZ 100-25 MG PO TABS: 1.0000 | ORAL_TABLET | Freq: Every day | ORAL | Status: DC

## 2012-10-23 ENCOUNTER — Other Ambulatory Visit: Payer: Self-pay | Admitting: *Deleted

## 2012-10-23 MED ORDER — LOSARTAN POTASSIUM-HCTZ 100-25 MG PO TABS: 1.0000 | ORAL_TABLET | Freq: Every day | ORAL | Status: DC

## 2012-10-31 ENCOUNTER — Encounter (HOSPITAL_COMMUNITY): Payer: Self-pay | Admitting: Emergency Medicine

## 2012-10-31 ENCOUNTER — Emergency Department (HOSPITAL_COMMUNITY)
Admission: EM | Admit: 2012-10-31 | Discharge: 2012-10-31 | Disposition: A | Discharge status: Home / Self Care | Payer: No Typology Code available for payment source | Attending: Emergency Medicine | Admitting: Emergency Medicine

## 2012-10-31 DIAGNOSIS — I252 Old myocardial infarction: Secondary | ICD-10-CM | POA: Insufficient documentation

## 2012-10-31 DIAGNOSIS — G4730 Sleep apnea, unspecified: Secondary | ICD-10-CM | POA: Insufficient documentation

## 2012-10-31 DIAGNOSIS — Z8669 Personal history of other diseases of the nervous system and sense organs: Secondary | ICD-10-CM | POA: Insufficient documentation

## 2012-10-31 DIAGNOSIS — G43909 Migraine, unspecified, not intractable, without status migrainosus: Secondary | ICD-10-CM | POA: Insufficient documentation

## 2012-10-31 DIAGNOSIS — R071 Chest pain on breathing: Secondary | ICD-10-CM | POA: Insufficient documentation

## 2012-10-31 DIAGNOSIS — R1110 Vomiting, unspecified: Secondary | ICD-10-CM | POA: Insufficient documentation

## 2012-10-31 DIAGNOSIS — Z9989 Dependence on other enabling machines and devices: Secondary | ICD-10-CM | POA: Insufficient documentation

## 2012-10-31 DIAGNOSIS — Z79899 Other long term (current) drug therapy: Secondary | ICD-10-CM | POA: Insufficient documentation

## 2012-10-31 DIAGNOSIS — Z9851 Tubal ligation status: Secondary | ICD-10-CM | POA: Insufficient documentation

## 2012-10-31 DIAGNOSIS — IMO0002 Reserved for concepts with insufficient information to code with codable children: Secondary | ICD-10-CM | POA: Insufficient documentation

## 2012-10-31 DIAGNOSIS — I509 Heart failure, unspecified: Secondary | ICD-10-CM | POA: Insufficient documentation

## 2012-10-31 DIAGNOSIS — I10 Essential (primary) hypertension: Secondary | ICD-10-CM | POA: Insufficient documentation

## 2012-10-31 DIAGNOSIS — Z792 Long term (current) use of antibiotics: Secondary | ICD-10-CM | POA: Insufficient documentation

## 2012-10-31 DIAGNOSIS — Z91040 Latex allergy status: Secondary | ICD-10-CM | POA: Insufficient documentation

## 2012-10-31 DIAGNOSIS — I2510 Atherosclerotic heart disease of native coronary artery without angina pectoris: Secondary | ICD-10-CM | POA: Insufficient documentation

## 2012-10-31 DIAGNOSIS — J329 Chronic sinusitis, unspecified: Secondary | ICD-10-CM

## 2012-10-31 MED ORDER — ONDANSETRON HCL 4 MG PO TABS: 4.0000 mg | ORAL_TABLET | Freq: Four times a day (QID) | ORAL | Status: DC

## 2012-10-31 NOTE — ED Notes (Signed)
Pt from home reports she was seen at this facility and dx with URI x2 weeks ago. Pt c/o "vomiting mucous" and nasal congestion. Pt denies diarrhea, fever. Pt is using Flonase, Sudafed PE, Afrin and Mucinex. Pt is A&O and in NAD

## 2012-10-31 NOTE — ED Provider Notes (Signed)
CSN: [id number]     Arrival date & time 10/31/12  0807 History   First MD Initiated Contact with Patient 10/31/12 [phone]     Chief Complaint  Patient presents with  . Emesis  . Nasal Congestion   (Consider location/radiation/quality/duration/timing/severity/associated sxs/prior Treatment) HPI  This is a 52 year old female with multiple medical problems including chronic sinusitis who presents with posttussive emesis. Patient was seen here on September 28 for chronic sinusitis symptoms. At that time she was given clindamycin and Levaquin. She reports that she began to get better on these antibiotics. She finished them on Thursday. Patient reports 3 days of posttussive emesis which is waking her up at night. She denies any fevers or shortness of breath. She states she coughs a lot and then vomits up white mucus. She is taking Flonase, Sudafed, Afrin, and Mucinex.  She states that she can't sleep at night because of the posttussive emesis.  Patient states that she tried to see her ENT doctor but he did not have any available appointments. She does report anterior chest pain which is sharp in nature during these coughing episodes.  She's currently chest pain-free.  Past Medical History  Diagnosis Date  . Hypokalemia   . CHF (congestive heart failure)   . Unspecified adverse effect of unspecified drug, medicinal and biological substance   . Otitis media   . External otitis   . Sinusitis   . Plantar fasciitis   . Family history of ischemic heart disease   . Common migraine   . Morbid obesity   . Hyperlipidemia   . OSA (obstructive sleep apnea)   . Carpal tunnel syndrome   . Hypertension   . CAD (coronary artery disease)   . Allergic rhinitis   . Routine general medical examination at a health care facility   . Shoulder and upper arm, insect bite, nonvenomous, without mention of infection(912.4)   . Unspecified adverse effect of unspecified drug, medicinal and biological substance   . Acute  serous otitis media   . Tubal ligation status   . Old myocardial infarction   . CPAP (continuous positive airway pressure) dependence   . Sleep apnea    Past Surgical History  Procedure Laterality Date  . Tubal ligation    . Tonsillectomy    . Uvulopalatopharyngoplasty    . Cesarean section    . Nasal sinus surgery    . Partial hysterectomy     Family History  Problem Relation Age of Onset  . Coronary artery disease      1st degree relatvie <50  . Breast cancer      aunt- ? paternal or maternal  . Asthma Sister   . Heart disease Mother    History  Substance Use Topics  . Smoking status: Never Smoker   . Smokeless tobacco: Never Used  . Alcohol Use: No   OB History   Grav Para Term Preterm Abortions TAB SAB Ect Mult Living                 Review of Systems  Constitutional: Negative for fever.  HENT: Positive for ear pain.   Respiratory: Positive for cough. Negative for chest tightness and shortness of breath.   Cardiovascular: Negative for chest pain.  Gastrointestinal: Positive for vomiting. Negative for nausea and abdominal pain.  Genitourinary: Negative for dysuria.  Musculoskeletal: Negative for myalgias.  Neurological: Negative for headaches.  All other systems reviewed and are negative.    Allergies  Doxycycline; Latex; Ciprofloxacin; Fish  allergy; Hydrocortisone; Neomycin; and Sulfonamide derivatives  Home Medications   Current Outpatient Rx  Name  Route  Sig  Dispense  Refill  . albuterol (PROVENTIL HFA;VENTOLIN HFA) 108 (90 BASE) MCG/ACT inhaler   Inhalation   Inhale 2 puffs into the lungs every 4 (four) hours as needed for wheezing.         [name] amoxicillin-clavulanate (AUGMENTIN) 875-125 MG per tablet   Oral   Take 1 tablet by mouth 2 (two) times daily. For 2 weeks.   28 tablet   0   . chlorpheniramine-HYDROcodone (TUSSIONEX PENNKINETIC ER) 10-8 MG/5ML LQCR   Oral   Take 5 mLs by mouth every 12 (twelve) hours as needed.   140 mL   0   .  clindamycin (CLEOCIN) 150 MG capsule   Oral   Take 1 capsule (150 mg total) by mouth 3 (three) times daily.   42 capsule   0   . cloNIDine (CATAPRES) 0.2 MG tablet   Oral   Take 1 tablet (0.2 mg total) by mouth 2 (two) times daily.   60 tablet   6   . dextromethorphan-guaiFENesin (MUCINEX DM) 30-600 MG per 12 hr tablet   Oral   Take 1 tablet by mouth every 12 (twelve) hours.         . fluticasone (FLONASE) 50 MCG/ACT nasal spray   Nasal   Place 2 sprays into the nose daily. For allergies         . furosemide (LASIX) 40 MG tablet   Oral   Take 2 tablets (80 mg total) by mouth daily as needed. For swelling or fluid retention.   30 tablet   6   . levofloxacin (LEVAQUIN) 500 MG tablet   Oral   Take 1 tablet (500 mg total) by mouth daily.   14 tablet   0   . losartan-hydrochlorothiazide (HYZAAR) 100-25 MG per tablet   Oral   Take 1 tablet by mouth daily.   90 tablet   0     No more refills available until patient has been s ...   . ondansetron (ZOFRAN) 4 MG tablet   Oral   Take 1 tablet (4 mg total) by mouth every 6 (six) hours.   12 tablet   0   . phenylephrine (SUDAFED PE) 10 MG TABS   Oral   Take 10 mg by mouth every 4 (four) hours as needed. For congestion         . potassium chloride SA (K-DUR,KLOR-CON) 20 MEQ tablet      Take 5 tablets every morning and 4 tablets every afternoon   270 tablet   12    BP 174/114  Pulse 65  Temp(Src) 98.4 F (36.9 C) (Oral)  Resp 20  SpO2 97% Physical Exam  Nursing note and vitals reviewed. Constitutional: She is oriented to person, place, and time.  Obese, nontoxic, in no acute distress  HENT:  Head: Normocephalic and atraumatic.  Right Ear: External ear normal.  Left Ear: External ear normal.  Nose: Nose normal.  Mouth/Throat: Oropharynx is clear and moist.  Tenderness to palpation over the bilateral maxillary sinuses, no frontal sinus tenderness  Eyes: Pupils are equal, round, and reactive to light.   Neck: Neck supple.  Cardiovascular: Normal rate, regular rhythm and normal heart sounds.   No murmur heard. Pulmonary/Chest: Effort normal and breath sounds normal. No respiratory distress. She has no wheezes.  Abdominal: Soft. There is no tenderness.  Neurological: She is alert and  oriented to person, place, and time.  Skin: Skin is warm and dry. No rash noted.  Psychiatric: She has a normal mood and affect.    ED Course  Procedures (including critical care time) Labs Review Labs Reviewed - No data to display Imaging Review No results found.  EKG Interpretation   None       MDM   1. Chronic sinusitis   2. Post-tussive emesis    This is a 52 year old female who presents with post tussive emesis in the setting of chronic sinusitis. She is nontoxic-appearing on exam. Vital signs are notable for a blood pressure 174/114. Patient is currently taking Sudafed which is likely exacerbating her blood pressures.  She reports taking her blood pressure medications this morning. I encouraged her to discontinue Sudafed use at this time. She is nontoxic and has been afebrile.  I spoke with Dr. Pollyann Kennedy who is on call for [facility] who agrees that given that the patient is afebrile she should not be restarted on antibody to this time. She needs to followup in ENT clinic.  She should call Monday for an appointment. I will give the patient Zofran given her posttussive emesis. At this time I do not feel further workup is necessary and the patient is safe for discharge home.  After history, exam, and medical workup I feel the patient has been appropriately medically screened and is safe for discharge home. Pertinent diagnoses were discussed with the patient. Patient was given return precautions.     Shon Baton, MD 10/31/12 [phone]

## 2012-11-05 ENCOUNTER — Ambulatory Visit (INDEPENDENT_AMBULATORY_CARE_PROVIDER_SITE_OTHER): Payer: No Typology Code available for payment source | Admitting: Internal Medicine

## 2012-11-05 ENCOUNTER — Encounter: Payer: Self-pay | Admitting: Internal Medicine

## 2012-11-05 VITALS — BP 152/90 | HR 67 | Temp 97.2°F | Ht 63.0 in | Wt 316.6 lb

## 2012-11-05 DIAGNOSIS — R05 Cough: Secondary | ICD-10-CM

## 2012-11-05 DIAGNOSIS — R053 Chronic cough: Secondary | ICD-10-CM | POA: Insufficient documentation

## 2012-11-05 DIAGNOSIS — R059 Cough, unspecified: Secondary | ICD-10-CM

## 2012-11-05 MED ORDER — PANTOPRAZOLE SODIUM 40 MG PO TBEC: 40.0000 mg | DELAYED_RELEASE_TABLET | Freq: Every day | ORAL | Status: DC

## 2012-11-05 MED ORDER — TRAMADOL HCL 50 MG PO TABS: ORAL_TABLET | ORAL | Status: DC

## 2012-11-05 MED ORDER — FAMOTIDINE 20 MG PO TABS: ORAL_TABLET | ORAL | Status: DC

## 2012-11-05 MED ORDER — PREDNISONE (PAK) 10 MG PO TABS: ORAL_TABLET | ORAL | Status: DC

## 2012-11-05 MED ORDER — AMOXICILLIN-POT CLAVULANATE 875-125 MG PO TABS: 1.0000 | ORAL_TABLET | Freq: Two times a day (BID) | ORAL | Status: DC

## 2012-11-05 NOTE — Progress Notes (Signed)
Subjective:     Patient ID: [name], female   DOB: 09/25/1960  MRN: [id number]  HPI   21 yobf never smoker pt of Drs Shelle Iron [facility] with recurrent cough attributed to upper airway cough syndrome since her 40's  11/05/2012 1st   office visit/ Jarred Purtee acute work in cc abrupt onset recurrent cough white thick with yellow mixed in  while on flonase then ER 9/28 and 10/11 levaquin and clindamycin and no better and cough to point of vomiting and urinary incont. Cough no better with saba.    No obvious day to day or daytime variabilty or assoc pleuritic  cp or  subjective wheeze overt sinus or hb symptoms. No unusual exp hx or h/o childhood pna/ asthma or knowledge of premature birth.  Sleeping ok without nocturnal  or early am exacerbation  of respiratory  c/o's or need for noct saba. Also denies any obvious fluctuation of symptoms with weather or environmental changes or other aggravating or alleviating factors except as outlined above   Current Medications, Allergies, Complete Past Medical History, Past Surgical History, Family History, and Social History were reviewed in [employer] record.  ROS  The following are not active complaints unless bolded sore throat, dysphagia, dental problems, itching, sneezing,  nasal congestion or excess/ purulent secretions, ear ache,   fever, chills, sweats, unintended wt loss, pleuritic or exertional cp, hemoptysis,  orthopnea pnd or leg swelling, presyncope, palpitations, heartburn, abdominal pain, anorexia, nausea, vomiting, diarrhea  or change in bowel or urinary habits, change in stools or urine, dysuria,hematuria,  rash, arthralgias, visual complaints, headache, numbness weakness or ataxia or problems with walking or coordination,  change in mood/affect or memory.          Review of Systems     Objective:   Physical Exam Hoarse obese  amb bf nad with harsh upper airway cough and prominent pseudowheeze  Wt Readings from  Last 3 Encounters:  11/05/12 316 lb 9.6 oz (143.609 kg)  03/10/12 306 lb 12.8 oz (139.164 kg)  01/07/11 306 lb 12.8 oz (139.164 kg)      HEENT: nl dentition, turbinates, and orophanx. Nl external ear canals without cough reflex   NECK :  without JVD/Nodes/TM/ nl carotid upstrokes bilaterally   LUNGS: no acc muscle use, clear to A and P bilaterally without cough on insp or exp maneuvers   CV:  RRR  no s3 or murmur or increase in P2, no edema   ABD:  soft and nontender with nl excursion in the supine position. No bruits or organomegaly, bowel sounds nl  MS:  warm without deformities, calf tenderness, cyanosis or clubbing  SKIN: warm and dry without lesions    NEURO:  alert, approp, no deficits    Sinus ct 03/18/12  Evidence of chronic sphenoid sinusitis with small fluid levels  in the right sphenoid and right maxillary sinuses.     Assessment:

## 2012-11-05 NOTE — Assessment & Plan Note (Addendum)
Classic Upper airway cough syndrome, so named because it's frequently impossible to sort out how much is  CR/sinusitis with freq throat clearing (which can be related to primary GERD)   vs  causing  secondary (" extra esophageal")  GERD from wide swings in gastric pressure that occur with throat clearing, often  promoting self use of mint and menthol lozenges that reduce the lower esophageal sphincter tone and exacerbate the problem further in a cyclical fashion.   These are the same pts (now being labeled as having "irritable larynx syndrome" by some cough centers) who not infrequently have a history of having failed to tolerate ace inhibitors,  dry powder inhalers or biphosphonates or report having atypical reflux symptoms that don't respond to standard doses of PPI , and are easily confused as having aecopd or asthma flares by even experienced allergists/ pulmonologists.    rec max rx for gerd/ cyclical cough and 14 days of augmentin then regroup re issue of additonal sinus eval/ rx per Clance/Wolicki

## 2012-11-05 NOTE — Patient Instructions (Addendum)
augmentin 875 mg twice daily x 14 days  Prednisone 10 mg take  4 each am x 2 days,   2 each am x 2 days,  1 each am x 2 days and stop   Pantoprazole (protonix) 40 mg   Take 30-60 min before first meal of the day and Pepcid 20 mg one bedtime until return to office - this is the best way to tell whether stomach acid is contributing to your problem.    Take mucinex dm 2  every 12 hours and supplement if needed with  tramadol 50 mg up to 2 every 4 hours to suppress the urge to cough. Swallowing water or using ice chips/non mint and menthol containing candies (such as lifesavers or sugarless jolly ranchers) are also effective.  You should rest your voice and avoid activities that you know make you cough.  Once you have eliminated the cough for 3 straight days try reducing the tramadol first,  then the mucinex dm GERD (REFLUX)  is an extremely common cause of respiratory symptoms, many times with no significant heartburn at all.    It can be treated with medication, but also with lifestyle changes including avoidance of late meals, excessive alcohol, smoking cessation, and avoid fatty foods, chocolate, peppermint, colas, red wine, and acidic juices such as orange juice.  NO MINT OR MENTHOL PRODUCTS SO NO COUGH DROPS  USE SUGARLESS CANDY INSTEAD ( sugarless jolley ranchers or Stover's)  NO OIL BASED VITAMINS - use powdered substitutes.     Please schedule a follow up office visit in 2 weeks, sooner if needed with Dr Shelle Iron to regroup re longterm treatment options to prevent this from recurring

## 2012-11-25 ENCOUNTER — Encounter: Payer: Self-pay | Admitting: Pulmonary Disease

## 2012-11-25 ENCOUNTER — Ambulatory Visit (INDEPENDENT_AMBULATORY_CARE_PROVIDER_SITE_OTHER)
Admission: RE | Admit: 2012-11-25 | Discharge: 2012-11-25 | Disposition: A | Discharge status: Home / Self Care | Payer: No Typology Code available for payment source | Source: Ambulatory Visit | Attending: Pulmonary Disease | Admitting: Pulmonary Disease

## 2012-11-25 ENCOUNTER — Ambulatory Visit (INDEPENDENT_AMBULATORY_CARE_PROVIDER_SITE_OTHER): Payer: No Typology Code available for payment source | Admitting: Pulmonary Disease

## 2012-11-25 VITALS — BP 138/102 | HR 78 | Temp 98.0°F | Ht 63.0 in | Wt 314.0 lb

## 2012-11-25 DIAGNOSIS — R059 Cough, unspecified: Secondary | ICD-10-CM

## 2012-11-25 DIAGNOSIS — R05 Cough: Secondary | ICD-10-CM

## 2012-11-25 MED ORDER — PREDNISONE 10 MG PO TABS: ORAL_TABLET | ORAL | Status: DC

## 2012-11-25 MED ORDER — TRAMADOL HCL 50 MG PO TABS: ORAL_TABLET | ORAL | Status: DC

## 2012-11-25 NOTE — Assessment & Plan Note (Signed)
The patient has had recurrence of her cough with associated shortness of breath.  She has known chronic sinusitis with recurrent flares, and this often leads to her worsening cyclical coughing.  The question has been raised whether she may have reactive airways associated with her chronic sinus disease, or whether she may have true asthma.  It is been in possible to document because she cannot follow instructions for simple spirometry.  Because her cough responds so well to prednisone, it certainly raises the question whether she may have airways disease.  I have always been hesitant to start her on inhaled corticosteroids because of her upper airway component to the cough, but will try her on Qvar with a spacer currently.  We'll also treat her with a prolonged course of prednisone to see if we can improve things.  Finally, I think she needs to continue aggressive treatment for acid reflux disease.

## 2012-11-25 NOTE — Patient Instructions (Addendum)
Will treat with prednisone.  Start 40mg  each day for 3 days, then 30mg  each day for 3 days, then 20mg  each day for 3 days, then 10mg  a day until next visit with me. Qvar , take 2 inhalations am and pm everyday no matter what with spacer.  Rinse mouth well. Stay on your acid reflux medication Can use tramadol for cough until the prednisone kicks in. Will schedule for ct chest to evaluate for bronchiectasis.  followup with me in 2-3 weeks, but call if not getting better.

## 2012-11-25 NOTE — Progress Notes (Signed)
  Subjective:    Patient ID: [name], female    DOB: 1960-08-12, 52 y.o.   MRN: [id number]  HPI Patient comes in today for an acute sick visit.  She has known chronic sinusitis, as well as classic cyclical coughing.  It is been very difficult to sort out whether she may have superimposed asthma, since she has not been able to follow any instructions for spirometry for documentation.  To date, she has not been felt to have asthma, but may possibly have reactive airways associated with acute on chronic sinusitis.  She was recently seen by one of my partners, and treated with Augmentin as well as prednisone.  She states that her cough rapidly improved and resolved while taking the prednisone, and her breathing improved as well.  Since being off the medication, she has had increasing cough with scant mucus, increasing sinus symptoms, and increasing shortness of breath.  She did have a chest x-ray today that showed no evidence of pneumonia, but did show some possible pleural thickening versus overlapping shadows on the lateral view.   Review of Systems  Constitutional: Positive for fatigue. Negative for fever and unexpected weight change.  HENT: Positive for congestion ( chest congestion green mucus). Negative for dental problem, ear pain, nosebleeds, postnasal drip, rhinorrhea, sinus pressure, sneezing, sore throat and trouble swallowing.   Eyes: Negative for redness and itching.  Respiratory: Positive for cough, chest tightness, shortness of breath and wheezing.   Cardiovascular: Positive for chest pain. Negative for palpitations and leg swelling.  Gastrointestinal: Negative for nausea and vomiting.  Genitourinary: Negative for dysuria.  Musculoskeletal: Negative for joint swelling.  Skin: Negative for rash.  Neurological: Negative for headaches.  Hematological: Does not bruise/bleed easily.  Psychiatric/Behavioral: Negative for dysphoric mood. The patient is not nervous/anxious.         Objective:   Physical Exam Morbidly obese female in no acute distress  Nose with erythematous mucosa and mild purulent discharge, but no gross pus. Oropharynx clear Neck without lymphadenopathy or thyromegaly Chest with very loud upper airway wheezing, excellent airflow, no crackles. Cardiac exam with regular rate and rhythm Lower extremities with mild edema, no cyanosis alert and oriented, moves all 4 extremities.       Assessment & Plan:

## 2012-11-30 ENCOUNTER — Encounter: Payer: Self-pay | Admitting: Pulmonary Disease

## 2012-12-01 ENCOUNTER — Ambulatory Visit (INDEPENDENT_AMBULATORY_CARE_PROVIDER_SITE_OTHER)
Admission: RE | Admit: 2012-12-01 | Discharge: 2012-12-01 | Disposition: A | Discharge status: Home / Self Care | Payer: No Typology Code available for payment source | Source: Ambulatory Visit | Attending: Pulmonary Disease | Admitting: Pulmonary Disease

## 2012-12-01 ENCOUNTER — Telehealth: Payer: Self-pay | Admitting: Pulmonary Disease

## 2012-12-01 DIAGNOSIS — R05 Cough: Secondary | ICD-10-CM

## 2012-12-01 DIAGNOSIS — R059 Cough, unspecified: Secondary | ICD-10-CM

## 2012-12-01 NOTE — Telephone Encounter (Signed)
Let her know that if prednisone and qvar did not improve her symptoms, then this is not asthma.  I did review the records from Dr. Lazarus Salines, and he did think she had chronic sinusitis.  He treated her with clinda and levaquin in April for a prolonged period, and she was supposed to f/u with him in 6 weeks for a repeat sinus scan.  Did she ever do this??? I will review ct scan and call her to discuss.

## 2012-12-01 NOTE — Telephone Encounter (Signed)
Spoke with pt and she just finished with Chest Ct.  Pt states that she doesn't feel any better since last week.  Pt still c/o chest hurting with deep breath.  SOB.  Prod cough (white).  Denies fever.  Pt on Pred 30mg /day and Qvar bid and Tramadol prn.  Results of Ct will be ready this afternoon.  Please advise

## 2012-12-02 NOTE — Telephone Encounter (Signed)
Patient call back about the same.

## 2012-12-02 NOTE — Telephone Encounter (Signed)
Noted  

## 2012-12-02 NOTE — Telephone Encounter (Signed)
Called, spoke with pt.  Informed her of below per [facility].  Pt states when she went back to see Dr. Lazarus Salines, he told her that "everything was ok."  Pt reports he didn't mention having a repeat ct sinus scan.  Pt states "I have no reason to lie about this."  She feels people are "passing the buck" as "one is saying this, and one is saying that."  Pt states she is "so tired of this "going back and forth like you are doing an experiment on me" and is thinking about calling the dr she seen up [place] to see if he can figure it out.  Pt was very frustrated with this, but did states she is willing to do another CT Sinus if [facility] wants her to have this done.  Pt was very upset that Dr. Lazarus Salines "told you lies about me" and states that she will call them about this.  Pt states the lady who did her CT yesterday advised 'it does show something is going on that I'm not allowed to tell you."  Pt is requesting [facility] call her with these results today.  Spoke with [facility] about above.  Per [facility], pt will need to come in to discuss this within the next few days.  Called, spoke with pt.  Informed her of this. Pt is in agreement with this.  We have scheduled her to see [facility] on Friday, Nov 14 at 9:30 am.  Pt aware and is in agreement with this plan.  She voiced no further questions or concerns at this time.

## 2012-12-04 ENCOUNTER — Encounter: Payer: Self-pay | Admitting: Pulmonary Disease

## 2012-12-04 ENCOUNTER — Ambulatory Visit (INDEPENDENT_AMBULATORY_CARE_PROVIDER_SITE_OTHER): Payer: No Typology Code available for payment source | Admitting: Pulmonary Disease

## 2012-12-04 ENCOUNTER — Telehealth: Payer: Self-pay | Admitting: Pulmonary Disease

## 2012-12-04 VITALS — HR 67 | Temp 97.8°F | Ht 63.0 in | Wt 315.0 lb

## 2012-12-04 DIAGNOSIS — R059 Cough, unspecified: Secondary | ICD-10-CM

## 2012-12-04 DIAGNOSIS — J329 Chronic sinusitis, unspecified: Secondary | ICD-10-CM

## 2012-12-04 DIAGNOSIS — R05 Cough: Secondary | ICD-10-CM

## 2012-12-04 NOTE — Telephone Encounter (Signed)
I spoke with Almyra Free. She has already spoken with pt. She is going to work on this on Monday.

## 2012-12-04 NOTE — Telephone Encounter (Signed)
Pt has appt with dr Thayer Ohm newman 12/09/12@9 :30am pt is aware [name]

## 2012-12-04 NOTE — Telephone Encounter (Signed)
Called, spoke with Joyce Gross at Dr. Allayne Stack office.  Reports she spoke with someone earlier and advised that they could see pt, but the pt would have to pay her old balance first.  Per Joyce Gross, the person she spoke with advised we would call [facility].  After this conversation, Joyce Gross states she received records on this pt.  She would like to make sure the patient is being taken care of.  Did we call [facility]?  Or, will pt need an appt with Dr. Allayne Stack office?  Joyce Gross states they are closing at 12:30 today.  Please call them back on Monday after 9am.  PCCs, I don't see documentation in the order yet.  Please advise.  Thank you.

## 2012-12-04 NOTE — Telephone Encounter (Signed)
Pt is very mad. She wants to know what she is suppose to do until she hears from dr crossley's office. She wants something now.  Please call pt back.

## 2012-12-04 NOTE — Addendum Note (Signed)
Addended by: Maisie Fus on: 12/04/2012 12:02 PM   Modules accepted: Orders

## 2012-12-04 NOTE — Telephone Encounter (Signed)
Returning call.Juanita S Davis ° °

## 2012-12-04 NOTE — Telephone Encounter (Signed)
Pt returning call can be reached at [phone].[name]

## 2012-12-04 NOTE — Telephone Encounter (Signed)
[name] will call back on Monday [name]

## 2012-12-04 NOTE — Assessment & Plan Note (Signed)
The patient feels the prednisone taper and Qvar has helped her cough, but it continues.  Again, I suspect most of the cough is coming from her chronic sinus disease, and she may have reactive airways disease associated with this.  On the other hand, I cannot totally exclude a component of asthma, but she does have a history of normal spirometry in the past.  I would like to keep her on Qvar for now, and work on her chronic sinus disease.  If this is taken care of, I suspect everything else will improve.

## 2012-12-04 NOTE — Telephone Encounter (Signed)
Pt called back and she reports Almyra Free told her to call back with this # [phone]. She reports Thursday is not work for her. Please advise Almyra Free thanks

## 2012-12-04 NOTE — Telephone Encounter (Signed)
Called this# and they do no participate with her ins and do no do sinus issues either  lmfptcb [name]

## 2012-12-04 NOTE — Assessment & Plan Note (Signed)
CT sinuses 02/2012:  Chronic sinusitis with small A/F levels >> rx by ENT with prolonged augmentin F/u ct sinuses 04/2012:  Persistent sinus thickening >> rx with levaquin and clinda for 30 days.  Told to return for sinus scan in 6 weeks >> no showed for followup visit.   The patient continues to have a cough with purulent mucus, and has known history of chronic sinusitis.  Her CT chest did not show any significant pulmonary pathology, and therefore I think the vast majority of this is coming from her upper airway.  The patient has not been happy with her prior otolaryngologist, and we'll therefore send her for a second opinion.

## 2012-12-04 NOTE — Progress Notes (Signed)
  Subjective:    Patient ID: [name], female    DOB: 1960/12/20, 52 y.o.   MRN: [id number]  HPI Patient comes in today for followup of her chronic cough.  She is felt to have chronic sinusitis as the etiology, and a recent CT chest was unremarkable.  She was recently started on a course of prednisone and Qvar for possible asthma, and the patient thinks this has helped.  She tells me that she has a history of childhood asthma, but this may simply be reactive airways disease related to her chronic sinus disease.  Since the last visit, I have received records from her prior otolaryngologist.  The patient was treated with a prolonged antibiotic course in April, and was supposed to followup in 6 weeks with a CT sinus.  She never did this.   Review of Systems  Constitutional: Negative for fever and unexpected weight change.  HENT: Negative for congestion, dental problem, ear pain, nosebleeds, postnasal drip, rhinorrhea, sinus pressure, sneezing, sore throat and trouble swallowing.   Eyes: Negative for redness and itching.  Respiratory: Positive for cough, shortness of breath and wheezing. Negative for chest tightness.   Cardiovascular: Negative for palpitations and leg swelling.  Gastrointestinal: Negative for nausea and vomiting.  Genitourinary: Negative for dysuria.  Musculoskeletal: Negative for joint swelling.  Skin: Negative for rash.  Neurological: Negative for headaches.  Hematological: Does not bruise/bleed easily.  Psychiatric/Behavioral: Negative for dysphoric mood. The patient is not nervous/anxious.        Objective:   Physical Exam Morbidly obese female in no acute distress Nose without purulent discharge noted Neck without lymphadenopathy or thyromegaly Chest totally clear to auscultation, loud upper airway pseudo-wheezes Cardiac exam with regular rate and rhythm Lower extremities with edema, no cyanosis Alert and oriented, moves all 4 extremities.        Assessment  & Plan:

## 2012-12-04 NOTE — Patient Instructions (Signed)
Continue on qvar 2 puffs am and pm everyday for now, for possible asthma.  I think it is likely you can stop this once we get your sinus disease taken care of. Stay on your acid reflux medication each day.  Will refer you to Dr. Haroldine Laws for a second opinion.  followup with me in 4mos if doing well.

## 2012-12-05 ENCOUNTER — Emergency Department (HOSPITAL_COMMUNITY)
Admission: EM | Admit: 2012-12-05 | Discharge: 2012-12-05 | Disposition: A | Discharge status: Home / Self Care | Payer: No Typology Code available for payment source | Attending: Emergency Medicine | Admitting: Emergency Medicine

## 2012-12-05 ENCOUNTER — Encounter (HOSPITAL_COMMUNITY): Payer: Self-pay | Admitting: Emergency Medicine

## 2012-12-05 DIAGNOSIS — R05 Cough: Secondary | ICD-10-CM | POA: Insufficient documentation

## 2012-12-05 DIAGNOSIS — R0682 Tachypnea, not elsewhere classified: Secondary | ICD-10-CM | POA: Insufficient documentation

## 2012-12-05 DIAGNOSIS — IMO0002 Reserved for concepts with insufficient information to code with codable children: Secondary | ICD-10-CM | POA: Insufficient documentation

## 2012-12-05 DIAGNOSIS — I2510 Atherosclerotic heart disease of native coronary artery without angina pectoris: Secondary | ICD-10-CM | POA: Insufficient documentation

## 2012-12-05 DIAGNOSIS — I509 Heart failure, unspecified: Secondary | ICD-10-CM | POA: Insufficient documentation

## 2012-12-05 DIAGNOSIS — Z79899 Other long term (current) drug therapy: Secondary | ICD-10-CM | POA: Insufficient documentation

## 2012-12-05 DIAGNOSIS — J40 Bronchitis, not specified as acute or chronic: Secondary | ICD-10-CM

## 2012-12-05 DIAGNOSIS — G4733 Obstructive sleep apnea (adult) (pediatric): Secondary | ICD-10-CM | POA: Insufficient documentation

## 2012-12-05 DIAGNOSIS — Z91040 Latex allergy status: Secondary | ICD-10-CM | POA: Insufficient documentation

## 2012-12-05 DIAGNOSIS — R0602 Shortness of breath: Secondary | ICD-10-CM | POA: Insufficient documentation

## 2012-12-05 DIAGNOSIS — R059 Cough, unspecified: Secondary | ICD-10-CM | POA: Insufficient documentation

## 2012-12-05 DIAGNOSIS — E669 Obesity, unspecified: Secondary | ICD-10-CM | POA: Insufficient documentation

## 2012-12-05 DIAGNOSIS — I10 Essential (primary) hypertension: Secondary | ICD-10-CM | POA: Insufficient documentation

## 2012-12-05 DIAGNOSIS — J329 Chronic sinusitis, unspecified: Secondary | ICD-10-CM | POA: Insufficient documentation

## 2012-12-05 DIAGNOSIS — E785 Hyperlipidemia, unspecified: Secondary | ICD-10-CM | POA: Insufficient documentation

## 2012-12-05 MED ORDER — IPRATROPIUM BROMIDE 0.02 % IN SOLN
1.0000 mg | Freq: Once | RESPIRATORY_TRACT | Status: AC
  Administered 2012-12-05: 1 mg via RESPIRATORY_TRACT
  Filled 2012-12-05: qty 5

## 2012-12-05 MED ORDER — IBUPROFEN 800 MG PO TABS
800.0000 mg | ORAL_TABLET | Freq: Once | ORAL | Status: AC
  Administered 2012-12-05: 800 mg via ORAL
  Filled 2012-12-05: qty 1

## 2012-12-05 MED ORDER — ALBUTEROL SULFATE (5 MG/ML) 0.5% IN NEBU
5.0000 mg | INHALATION_SOLUTION | Freq: Once | RESPIRATORY_TRACT | Status: AC
  Administered 2012-12-05: 5 mg via RESPIRATORY_TRACT
  Filled 2012-12-05: qty 1

## 2012-12-05 MED ORDER — ALBUTEROL SULFATE HFA 108 (90 BASE) MCG/ACT IN AERS
2.0000 | INHALATION_SPRAY | Freq: Once | RESPIRATORY_TRACT | Status: AC
  Administered 2012-12-05: 2 via RESPIRATORY_TRACT
  Filled 2012-12-05: qty 6.7

## 2012-12-05 NOTE — ED Notes (Signed)
Pt c/o bilateral ear pain and productive cough x 2 wks.

## 2012-12-05 NOTE — ED Provider Notes (Signed)
CSN: [id number]     Arrival date & time 12/05/12  4098 History   First MD Initiated Contact with Patient 12/05/12 [phone]     Chief Complaint  Patient presents with  . Cough   (Consider location/radiation/quality/duration/timing/severity/associated sxs/prior Treatment) The history is provided by the patient.  [name] is a 52 y.o. female hx of CHF, chronic sinusitis, obesity, OSA here with SOB and cough. She has been having productive cough for the last several months and worse in the last 2 weeks. Also some chronic sinus congestion that is getting worse. The last week she had some bilateral ear pain when she coughs. She has been followup with ENT as well as pulmonology for this. He was diagnosed with chronic sinusitis and just finished 2 weeks of steroids taper.  She is also currently on Qvar as well as Flonase. She is scheduled to see another ENT specialist for second opinion and had a chest x-ray last week that was normal. However over the last few days she has been having more productive cough but denies any fever. She states that her medications hasn't been helping her. Denies chest pain or increased leg swelling.    Past Medical History  Diagnosis Date  . Hypokalemia   . CHF (congestive heart failure)   . Unspecified adverse effect of unspecified drug, medicinal and biological substance   . Otitis media   . External otitis   . Sinusitis   . Plantar fasciitis   . Family history of ischemic heart disease   . Common migraine   . Morbid obesity   . Hyperlipidemia   . OSA (obstructive sleep apnea)   . Carpal tunnel syndrome   . Hypertension   . CAD (coronary artery disease)   . Allergic rhinitis   . Routine general medical examination at a health care facility   . Shoulder and upper arm, insect bite, nonvenomous, without mention of infection(912.4)   . Unspecified adverse effect of unspecified drug, medicinal and biological substance   . Acute serous otitis media   . Tubal  ligation status   . Old myocardial infarction   . CPAP (continuous positive airway pressure) dependence   . Sleep apnea    Past Surgical History  Procedure Laterality Date  . Tubal ligation    . Tonsillectomy    . Uvulopalatopharyngoplasty    . Cesarean section    . Nasal sinus surgery    . Partial hysterectomy     Family History  Problem Relation Age of Onset  . Coronary artery disease      1st degree relatvie <50  . Breast cancer      aunt- ? paternal or maternal  . Asthma Sister   . Heart disease Mother    History  Substance Use Topics  . Smoking status: Never Smoker   . Smokeless tobacco: Never Used  . Alcohol Use: No   OB History   Grav Para Term Preterm Abortions TAB SAB Ect Mult Living                 Review of Systems  Respiratory: Positive for cough.   All other systems reviewed and are negative.    Allergies  Doxycycline; Latex; Ciprofloxacin; Fish allergy; Hydrocortisone; Neomycin; and Sulfonamide derivatives  Home Medications   Current Outpatient Rx  Name  Route  Sig  Dispense  Refill  . albuterol (PROVENTIL HFA;VENTOLIN HFA) 108 (90 BASE) MCG/ACT inhaler   Inhalation   Inhale 2 puffs into the lungs  every 4 (four) hours as needed for wheezing.         . beclomethasone (QVAR) 80 MCG/ACT inhaler   Inhalation   Inhale 2 puffs into the lungs 2 (two) times daily.         . cloNIDine (CATAPRES) 0.2 MG tablet   Oral   Take 1 tablet (0.2 mg total) by mouth 2 (two) times daily.   60 tablet   6   . famotidine (PEPCID) 20 MG tablet      One at bedtime   30 tablet   11   . fluticasone (FLONASE) 50 MCG/ACT nasal spray   Nasal   Place 2 sprays into the nose daily. For allergies         . furosemide (LASIX) 40 MG tablet   Oral   Take 2 tablets (80 mg total) by mouth daily as needed. For swelling or fluid retention.   30 tablet   6   . losartan-hydrochlorothiazide (HYZAAR) 100-25 MG per tablet   Oral   Take 1 tablet by mouth daily.    90 tablet   0     No more refills available until patient has been s ...   . pantoprazole (PROTONIX) 40 MG tablet   Oral   Take 1 tablet (40 mg total) by mouth daily. Take 30-60 min before first meal of the day   30 tablet   2   . potassium chloride SA (K-DUR,KLOR-CON) 20 MEQ tablet      Take 5 tablets every morning and 4 tablets every afternoon   270 tablet   12   . predniSONE (DELTASONE) 10 MG tablet      Take 40 mg x 3days, 30 mg x 3 days, 20 mg x 3 days, then 10 mg daily until next OV (12-11-12)   34 tablet   0   . traMADol (ULTRAM) 50 MG tablet      1-2 every 6 hours as needed for cough or pain   30 tablet   0    SpO2 98% Physical Exam  Nursing note and vitals reviewed. Constitutional: She is oriented to person, place, and time. She appears well-nourished.  Coughing, but talking in full sentences. Obese   HENT:  Head: Normocephalic.  Right Ear: External ear normal.  Left Ear: External ear normal.  Mouth/Throat: Oropharynx is clear and moist.  + bilateral sinus pressure   Eyes: Conjunctivae are normal. Pupils are equal, round, and reactive to light.  Neck: Normal range of motion. Neck supple.  Cardiovascular: Normal rate, regular rhythm and normal heart sounds.   Pulmonary/Chest:  Slightly tachypneic. Wheezing with coughing. No crackles. Not using accessory muscles   Abdominal: Soft. Bowel sounds are normal. She exhibits no distension. There is no tenderness. There is no rebound.  Musculoskeletal: Normal range of motion.  Neurological: She is alert and oriented to person, place, and time.  Skin: Skin is warm and dry.  Psychiatric: She has a normal mood and affect. Her behavior is normal. Judgment and thought content normal.    ED Course  Procedures (including critical care time) Labs Review Labs Reviewed - No data to display Imaging Review No results found.  EKG Interpretation   None       MDM  No diagnosis found. [name] is a 52 y.o.  female here with cough, wheezing. Likely bronchitis vs congestion from chronic sinusitis. She had a cxr several days ago that was unremarkable. Will give albuterol for symptomatic treatment. Not  septic, not concerned for pneumonia.   8:51 AM Felt better with 1 neb. Minimal wheezing now. Likely bronchitis. No need for steroids. Will d/c home with prn albuterol.    Richardean Canal, MD 12/05/12 [phone]

## 2012-12-07 ENCOUNTER — Ambulatory Visit: Payer: No Typology Code available for payment source | Admitting: Cardiology

## 2012-12-07 ENCOUNTER — Telehealth: Payer: Self-pay | Admitting: Pulmonary Disease

## 2012-12-07 NOTE — Telephone Encounter (Signed)
I called and spoke with pt. She reports she went to the ED 12/05/12. Was giving albuterol neb tx. She was giving 4 vials pt reports. She was told to call us to see if she needs this at home. Pt also went to see Dr. Brion Aliment today and was placed on ABX-augmentin x 10 days. Pt reports he listened to her and reports he thought this was asthma. She was also told to call us bc she has been to the ed 4 times. Pt aware she will not get a call back until tomorrow. Please advise KC thanks

## 2012-12-08 NOTE — Telephone Encounter (Signed)
If the pt sees no change in breathing with inhaled steroids and oral prednisone, and has never had airflow obstruction on any of her breathing tests, it is very difficult to call this asthma.  I think it is upper airway issue related to untreated chronic sinus disease.   I would like to see how she does with more aggressive treatment of her sinus disease.

## 2012-12-08 NOTE — Telephone Encounter (Signed)
Spoke with patient- aware of recs from [facility]. Will speak with Dr Allayne Stack office tomorrow. Nothing more needed at this time.

## 2012-12-08 NOTE — Telephone Encounter (Signed)
I spoke with the pt and advised that we have sent a message to Dr. Shelle Iron to address and we will call her before we leave today. Carron Curie, CMA

## 2012-12-08 NOTE — Telephone Encounter (Signed)
Pt is calling back to check on the status of this message left yesterday.  Would like a returned call asap.  Pt can be reached at [phone].  [name]

## 2012-12-11 ENCOUNTER — Ambulatory Visit: Payer: No Typology Code available for payment source | Admitting: Pulmonary Disease

## 2012-12-15 ENCOUNTER — Ambulatory Visit: Payer: No Typology Code available for payment source | Admitting: Pulmonary Disease

## 2012-12-29 ENCOUNTER — Emergency Department (HOSPITAL_COMMUNITY)
Admission: EM | Admit: 2012-12-29 | Discharge: 2012-12-29 | Disposition: A | Discharge status: Home / Self Care | Payer: No Typology Code available for payment source | Attending: Emergency Medicine | Admitting: Emergency Medicine

## 2012-12-29 ENCOUNTER — Encounter (HOSPITAL_COMMUNITY): Payer: Self-pay | Admitting: Emergency Medicine

## 2012-12-29 ENCOUNTER — Emergency Department (HOSPITAL_COMMUNITY): Payer: No Typology Code available for payment source

## 2012-12-29 DIAGNOSIS — Z9889 Other specified postprocedural states: Secondary | ICD-10-CM | POA: Insufficient documentation

## 2012-12-29 DIAGNOSIS — G43009 Migraine without aura, not intractable, without status migrainosus: Secondary | ICD-10-CM | POA: Insufficient documentation

## 2012-12-29 DIAGNOSIS — Z791 Long term (current) use of non-steroidal anti-inflammatories (NSAID): Secondary | ICD-10-CM | POA: Insufficient documentation

## 2012-12-29 DIAGNOSIS — Z91040 Latex allergy status: Secondary | ICD-10-CM | POA: Insufficient documentation

## 2012-12-29 DIAGNOSIS — Z8669 Personal history of other diseases of the nervous system and sense organs: Secondary | ICD-10-CM | POA: Insufficient documentation

## 2012-12-29 DIAGNOSIS — IMO0002 Reserved for concepts with insufficient information to code with codable children: Secondary | ICD-10-CM | POA: Insufficient documentation

## 2012-12-29 DIAGNOSIS — I10 Essential (primary) hypertension: Secondary | ICD-10-CM | POA: Insufficient documentation

## 2012-12-29 DIAGNOSIS — I509 Heart failure, unspecified: Secondary | ICD-10-CM | POA: Insufficient documentation

## 2012-12-29 DIAGNOSIS — Z8739 Personal history of other diseases of the musculoskeletal system and connective tissue: Secondary | ICD-10-CM | POA: Insufficient documentation

## 2012-12-29 DIAGNOSIS — J029 Acute pharyngitis, unspecified: Secondary | ICD-10-CM | POA: Insufficient documentation

## 2012-12-29 DIAGNOSIS — Z79899 Other long term (current) drug therapy: Secondary | ICD-10-CM | POA: Insufficient documentation

## 2012-12-29 DIAGNOSIS — J329 Chronic sinusitis, unspecified: Secondary | ICD-10-CM | POA: Insufficient documentation

## 2012-12-29 DIAGNOSIS — Z862 Personal history of diseases of the blood and blood-forming organs and certain disorders involving the immune mechanism: Secondary | ICD-10-CM | POA: Insufficient documentation

## 2012-12-29 DIAGNOSIS — I2510 Atherosclerotic heart disease of native coronary artery without angina pectoris: Secondary | ICD-10-CM | POA: Insufficient documentation

## 2012-12-29 DIAGNOSIS — I252 Old myocardial infarction: Secondary | ICD-10-CM | POA: Insufficient documentation

## 2012-12-29 DIAGNOSIS — J310 Chronic rhinitis: Secondary | ICD-10-CM

## 2012-12-29 DIAGNOSIS — Z8639 Personal history of other endocrine, nutritional and metabolic disease: Secondary | ICD-10-CM | POA: Insufficient documentation

## 2012-12-29 NOTE — ED Provider Notes (Signed)
CSN: [id number]     Arrival date & time 12/29/12  2536 History   None    Chief Complaint  Patient presents with  . Cough   (Consider location/radiation/quality/duration/timing/severity/associated sxs/prior Treatment) HPI Comments: Ms. Wilinski is a 52 year old woman with a PMH of CHF, obesity, HLD, CAD, allergic rhinitis and OSA.  She presents with a 1 week complaint for B/L ear fullness, maxillary sinus pressure and productive cough.  The cough was initially productive of clear mucous but is now yellow.  She denies fevers, chills, CP/SOB, myalgias, confusion, change in bowel or bladder.  Her son is sick.  She continues to use her albuterol inhaler twice daily and has not increased the use.  She also uses Flonase.    Patient is a 52 y.o. female presenting with cough.  Cough Associated symptoms: sore throat   Associated symptoms: no chest pain, no chills, no fever, no myalgias and no shortness of breath     Past Medical History  Diagnosis Date  . Hypokalemia   . CHF (congestive heart failure)   . Unspecified adverse effect of unspecified drug, medicinal and biological substance   . Otitis media   . External otitis   . Sinusitis   . Plantar fasciitis   . Family history of ischemic heart disease   . Common migraine   . Morbid obesity   . Hyperlipidemia   . OSA (obstructive sleep apnea)   . Carpal tunnel syndrome   . Hypertension   . CAD (coronary artery disease)   . Allergic rhinitis   . Routine general medical examination at a health care facility   . Shoulder and upper arm, insect bite, nonvenomous, without mention of infection(912.4)   . Unspecified adverse effect of unspecified drug, medicinal and biological substance   . Acute serous otitis media   . Tubal ligation status   . Old myocardial infarction   . CPAP (continuous positive airway pressure) dependence   . Sleep apnea    Past Surgical History  Procedure Laterality Date  . Tubal ligation    . Tonsillectomy    .  Uvulopalatopharyngoplasty    . Cesarean section    . Nasal sinus surgery    . Partial hysterectomy     Family History  Problem Relation Age of Onset  . Coronary artery disease      1st degree relatvie <50  . Breast cancer      aunt- ? paternal or maternal  . Asthma Sister   . Heart disease Mother    History  Substance Use Topics  . Smoking status: Never Smoker   . Smokeless tobacco: Never Used  . Alcohol Use: No   OB History   Grav Para Term Preterm Abortions TAB SAB Ect Mult Living                 Review of Systems  Constitutional: Negative for fever, chills and appetite change.  HENT: Positive for congestion, sinus pressure and sore throat.   Respiratory: Positive for cough. Negative for chest tightness and shortness of breath.   Cardiovascular: Negative for chest pain.  Gastrointestinal: Negative for diarrhea and constipation.  Genitourinary: Negative for dysuria.  Musculoskeletal: Negative for myalgias.    Allergies  Doxycycline; Latex; Ciprofloxacin; Fish allergy; Hydrocortisone; Neomycin; and Sulfonamide derivatives  Home Medications   Current Outpatient Rx  Name  Route  Sig  Dispense  Refill  . albuterol (PROVENTIL HFA;VENTOLIN HFA) 108 (90 BASE) MCG/ACT inhaler   Inhalation  Inhale 2 puffs into the lungs every 4 (four) hours as needed for wheezing.         . cloNIDine (CATAPRES) 0.2 MG tablet   Oral   Take 1 tablet (0.2 mg total) by mouth 2 (two) times daily.   60 tablet   6   . famotidine (PEPCID) 20 MG tablet      One at bedtime   30 tablet   11   . fexofenadine-pseudoephedrine (ALLEGRA-D 24) 180-240 MG per 24 hr tablet   Oral   Take 1 tablet by mouth daily.         . fluticasone (FLONASE) 50 MCG/ACT nasal spray   Nasal   Place 2 sprays into the nose daily. For allergies         . furosemide (LASIX) 40 MG tablet   Oral   Take 2 tablets (80 mg total) by mouth daily as needed. For swelling or fluid retention.   30 tablet   6   .  losartan-hydrochlorothiazide (HYZAAR) 100-25 MG per tablet   Oral   Take 1 tablet by mouth daily.   90 tablet   0     No more refills available until patient has been s ...   . naproxen sodium (ANAPROX) 220 MG tablet   Oral   Take 440 mg by mouth 2 (two) times daily with a meal.          BP 149/74  Pulse 76  Temp(Src) 97.9 F (36.6 C)  Resp 20  SpO2 97% Physical Exam  Constitutional: She is oriented to person, place, and time. No distress.  HENT:  Head: Normocephalic and atraumatic.  Right Ear: External ear normal.  Left Ear: External ear normal.  Mouth/Throat: Oropharynx is clear and moist. No oropharyngeal exudate.  maxillary sinuses TTP TMs + cone of light B/L  Eyes: EOM are normal. Pupils are equal, round, and reactive to light. No scleral icterus.  Neck: Neck supple.  Cardiovascular: Normal rate, regular rhythm and normal heart sounds.   Pulmonary/Chest: No respiratory distress. She has no wheezes. She has no rales.  Abdominal: Soft. She exhibits no distension. There is no tenderness.  Musculoskeletal: Normal range of motion. She exhibits no tenderness.  2+ B/L pretibial edema   Lymphadenopathy:    She has no cervical adenopathy.  Neurological: She is alert and oriented to person, place, and time.  Skin: Skin is warm. She is not diaphoretic.  Psychiatric: She has a normal mood and affect. Her behavior is normal.    ED Course  Procedures (including critical care time) Labs Review Labs Reviewed - No data to display Imaging Review No results found.  EKG Interpretation   None       MDM  52 year old woman with PMH of CHF, asthma, OSA reports with 1 week history bilateral ear and sinus pressure as well as productive cough.  Maxillary pressure on exam; ears clear and TMs with good cone of light.  Lungs CTA, no wheezing or crackles.  This is likely viral versus allergic rhinosinusitis.  Will obtain CXR to rule out lower respiratory infection/inflammation.     CXR - no acute cardiopulmonary abnormality. She is stable for discharge home.   Patient advised to continue Flonase and and Allegra D.  She has been advised to follow-up with her PCP if her symptoms do not resolved in the next few days.  She has been advised to return to the ED with worsening or new symptoms including fever/chills, facial  swelling, purulent nasal discharge.   Evelena Peat, DO 12/29/12 1040

## 2012-12-29 NOTE — ED Provider Notes (Signed)
I saw and evaluated the patient, reviewed the resident's note and I agree with the findings and plan.  EKG Interpretation   None      This 52 year old female has a history of chronic intermittent sinus congestion and pressure, as well as intermittent cough, last month she had been on steroids for exacerbation of cough with sinus congestion, she was well for a couple weeks and now she has another week of cough and sinus pressure with ear pressure and fullness bilaterally, no nasal discharge, she has no fever and altered mental status no shortness of breath no chest pain her lungs are clear to auscultation unlabored no crackles no wheezing no retractions no rhonchi no accessory muscle usage, speech is normal with pulse oximetry normal on room air, bilateral tympanic membranes are clear.  Hurman Horn, MD 12/31/12 [phone]

## 2012-12-29 NOTE — ED Notes (Signed)
Pt c/o fluid in ears; pressure in ears; cough productive yellow sputum x 1 wk; no fever

## 2013-01-19 ENCOUNTER — Telehealth: Payer: Self-pay | Admitting: Cardiology

## 2013-01-19 MED ORDER — CLONIDINE HCL 0.2 MG PO TABS: 0.2000 mg | ORAL_TABLET | Freq: Two times a day (BID) | ORAL | Status: DC

## 2013-01-19 MED ORDER — LOSARTAN POTASSIUM-HCTZ 100-25 MG PO TABS: 1.0000 | ORAL_TABLET | Freq: Every day | ORAL | Status: DC

## 2013-01-19 NOTE — Telephone Encounter (Signed)
Spoke with pt who states she can not be here 1/8 for her scheduled appt and would like to reschedule.  appt scheduled to 02/26/2013 at 9:30 am.  Pt aware.  Will refill medication until appt.

## 2013-01-19 NOTE — Telephone Encounter (Signed)
New Problem:  Pt is requesting a sooner appt. Pt does not want to see the PA or NP... Pt wants to know what is going to be done with her BP meds. Pt states our office canceled her Nov appt. Pt wants to be seen asap and would like to speak to the nurse about being worked in.

## 2013-01-26 ENCOUNTER — Telehealth: Payer: Self-pay

## 2013-01-26 NOTE — Telephone Encounter (Signed)
Patient called but call was lost i called the patient back to let patient know that her rx was filled on 12/2012 with 1 refill and I also let her know that she need to keep her appointment on 2.6.15 with Dr Percival Spanish

## 2013-01-28 ENCOUNTER — Ambulatory Visit: Payer: No Typology Code available for payment source | Admitting: Cardiology

## 2013-02-09 ENCOUNTER — Emergency Department (HOSPITAL_COMMUNITY): Payer: No Typology Code available for payment source

## 2013-02-09 ENCOUNTER — Encounter (HOSPITAL_COMMUNITY): Payer: Self-pay | Admitting: Emergency Medicine

## 2013-02-09 ENCOUNTER — Telehealth: Payer: Self-pay | Admitting: Cardiology

## 2013-02-09 ENCOUNTER — Emergency Department (HOSPITAL_COMMUNITY)
Admission: EM | Admit: 2013-02-09 | Discharge: 2013-02-09 | Disposition: A | Discharge status: Home / Self Care | Payer: No Typology Code available for payment source | Attending: Emergency Medicine | Admitting: Emergency Medicine

## 2013-02-09 DIAGNOSIS — I252 Old myocardial infarction: Secondary | ICD-10-CM | POA: Insufficient documentation

## 2013-02-09 DIAGNOSIS — R61 Generalized hyperhidrosis: Secondary | ICD-10-CM | POA: Insufficient documentation

## 2013-02-09 DIAGNOSIS — R0989 Other specified symptoms and signs involving the circulatory and respiratory systems: Secondary | ICD-10-CM | POA: Insufficient documentation

## 2013-02-09 DIAGNOSIS — R0609 Other forms of dyspnea: Secondary | ICD-10-CM | POA: Insufficient documentation

## 2013-02-09 DIAGNOSIS — Z79899 Other long term (current) drug therapy: Secondary | ICD-10-CM | POA: Insufficient documentation

## 2013-02-09 DIAGNOSIS — R609 Edema, unspecified: Secondary | ICD-10-CM | POA: Insufficient documentation

## 2013-02-09 DIAGNOSIS — Z91040 Latex allergy status: Secondary | ICD-10-CM | POA: Insufficient documentation

## 2013-02-09 DIAGNOSIS — R0602 Shortness of breath: Secondary | ICD-10-CM | POA: Insufficient documentation

## 2013-02-09 DIAGNOSIS — I10 Essential (primary) hypertension: Secondary | ICD-10-CM | POA: Insufficient documentation

## 2013-02-09 DIAGNOSIS — Z791 Long term (current) use of non-steroidal anti-inflammatories (NSAID): Secondary | ICD-10-CM | POA: Insufficient documentation

## 2013-02-09 DIAGNOSIS — G4733 Obstructive sleep apnea (adult) (pediatric): Secondary | ICD-10-CM | POA: Insufficient documentation

## 2013-02-09 DIAGNOSIS — M1710 Unilateral primary osteoarthritis, unspecified knee: Secondary | ICD-10-CM | POA: Insufficient documentation

## 2013-02-09 DIAGNOSIS — Z9989 Dependence on other enabling machines and devices: Secondary | ICD-10-CM | POA: Insufficient documentation

## 2013-02-09 DIAGNOSIS — IMO0002 Reserved for concepts with insufficient information to code with codable children: Secondary | ICD-10-CM | POA: Insufficient documentation

## 2013-02-09 DIAGNOSIS — Z9981 Dependence on supplemental oxygen: Secondary | ICD-10-CM | POA: Insufficient documentation

## 2013-02-09 DIAGNOSIS — R0600 Dyspnea, unspecified: Secondary | ICD-10-CM

## 2013-02-09 DIAGNOSIS — I2510 Atherosclerotic heart disease of native coronary artery without angina pectoris: Secondary | ICD-10-CM | POA: Insufficient documentation

## 2013-02-09 DIAGNOSIS — I509 Heart failure, unspecified: Secondary | ICD-10-CM | POA: Insufficient documentation

## 2013-02-09 DIAGNOSIS — E119 Type 2 diabetes mellitus without complications: Secondary | ICD-10-CM | POA: Insufficient documentation

## 2013-02-09 DIAGNOSIS — G43009 Migraine without aura, not intractable, without status migrainosus: Secondary | ICD-10-CM | POA: Insufficient documentation

## 2013-02-09 DIAGNOSIS — R5381 Other malaise: Secondary | ICD-10-CM | POA: Insufficient documentation

## 2013-02-09 DIAGNOSIS — R1110 Vomiting, unspecified: Secondary | ICD-10-CM | POA: Insufficient documentation

## 2013-02-09 DIAGNOSIS — R5383 Other fatigue: Secondary | ICD-10-CM

## 2013-02-09 DIAGNOSIS — J811 Chronic pulmonary edema: Secondary | ICD-10-CM | POA: Insufficient documentation

## 2013-02-09 DIAGNOSIS — Z9851 Tubal ligation status: Secondary | ICD-10-CM | POA: Insufficient documentation

## 2013-02-09 LAB — COMPREHENSIVE METABOLIC PANEL
ALBUMIN: 3 g/dL — AB (ref 3.5–5.2)
ALK PHOS: 64 U/L (ref 39–117)
ALT: 19 U/L (ref 0–35)
AST: 28 U/L (ref 0–37)
BUN: 15 mg/dL (ref 6–23)
CALCIUM: 9.1 mg/dL (ref 8.4–10.5)
CO2: 24 mEq/L (ref 19–32)
Chloride: 102 mEq/L (ref 96–112)
Creatinine, Ser: 0.57 mg/dL (ref 0.50–1.10)
GFR calc non Af Amer: >90 mL/min (ref >90)
Glucose, Bld: 120 mg/dL — ABNORMAL HIGH (ref 70–99)
Potassium: 3.6 mEq/L — ABNORMAL LOW (ref 3.7–5.3)
SODIUM: 141 meq/L (ref 137–147)
TOTAL PROTEIN: 7.1 g/dL (ref 6.0–8.3)
Total Bilirubin: 0.3 mg/dL (ref 0.3–1.2)

## 2013-02-09 LAB — CBC WITH DIFFERENTIAL/PLATELET
BASOS ABS: 0 10*3/uL (ref 0.0–0.1)
Basophils Relative: 0 % (ref 0–1)
Eosinophils Absolute: 0.4 10*3/uL (ref 0.0–0.7)
Eosinophils Relative: 4 % (ref 0–5)
HEMATOCRIT: 36.2 % (ref 36.0–46.0)
Hemoglobin: 12 g/dL (ref 12.0–15.0)
LYMPHS PCT: 30 % (ref 12–46)
Lymphs Abs: 3.2 10*3/uL (ref 0.7–4.0)
MCH: 30.3 pg (ref 26.0–34.0)
MCHC: 33.1 g/dL (ref 30.0–36.0)
MCV: 91.4 fL (ref 78.0–100.0)
MONO ABS: 0.6 10*3/uL (ref 0.1–1.0)
Monocytes Relative: 6 % (ref 3–12)
Neutro Abs: 6.4 10*3/uL (ref 1.7–7.7)
Neutrophils Relative %: 60 % (ref 43–77)
Platelets: 273 10*3/uL (ref 150–400)
RBC: 3.96 MIL/uL (ref 3.87–5.11)
RDW: 13.6 % (ref 11.5–15.5)
WBC: 10.5 10*3/uL (ref 4.0–10.5)

## 2013-02-09 LAB — URINALYSIS, ROUTINE W REFLEX MICROSCOPIC
Bilirubin Urine: NEGATIVE
Glucose, UA: NEGATIVE mg/dL
Hgb urine dipstick: NEGATIVE
Ketones, ur: NEGATIVE mg/dL
Leukocytes, UA: NEGATIVE
Nitrite: NEGATIVE
Protein, ur: NEGATIVE mg/dL
SPECIFIC GRAVITY, URINE: 1.013 (ref 1.005–1.030)
UROBILINOGEN UA: 0.2 mg/dL (ref 0.0–1.0)
pH: 6.5 (ref 5.0–8.0)

## 2013-02-09 LAB — POCT I-STAT TROPONIN I: Troponin i, poc: 0.01 ng/mL (ref 0.00–0.08)

## 2013-02-09 LAB — GLUCOSE, CAPILLARY: Glucose-Capillary: 111 mg/dL — ABNORMAL HIGH (ref 70–99)

## 2013-02-09 LAB — D-DIMER, QUANTITATIVE (NOT AT ARMC): D-Dimer, Quant: 0.74 ug/mL-FEU — ABNORMAL HIGH (ref 0.00–0.48)

## 2013-02-09 LAB — TROPONIN I

## 2013-02-09 LAB — PRO B NATRIURETIC PEPTIDE: Pro B Natriuretic peptide (BNP): 193.2 pg/mL — ABNORMAL HIGH (ref 0–125)

## 2013-02-09 MED ORDER — FUROSEMIDE 10 MG/ML IJ SOLN
20.0000 mg | INTRAMUSCULAR | Status: AC
  Administered 2013-02-09: 20 mg via INTRAVENOUS
  Filled 2013-02-09: qty 4

## 2013-02-09 MED ORDER — CLONIDINE HCL 0.1 MG PO TABS
0.3000 mg | ORAL_TABLET | Freq: Once | ORAL | Status: AC
  Administered 2013-02-09: 0.3 mg via ORAL
  Filled 2013-02-09: qty 3

## 2013-02-09 MED ORDER — ALBUTEROL SULFATE HFA 108 (90 BASE) MCG/ACT IN AERS
2.0000 | INHALATION_SPRAY | RESPIRATORY_TRACT | Status: DC | PRN
  Administered 2013-02-09: 2 via RESPIRATORY_TRACT
  Filled 2013-02-09: qty 6.7

## 2013-02-09 MED ORDER — FUROSEMIDE 10 MG/ML IJ SOLN
40.0000 mg | Freq: Once | INTRAMUSCULAR | Status: AC
  Administered 2013-02-09: 40 mg via INTRAVENOUS
  Filled 2013-02-09: qty 4

## 2013-02-09 MED ORDER — IOHEXOL 350 MG/ML SOLN
100.0000 mL | Freq: Once | INTRAVENOUS | Status: AC | PRN
  Administered 2013-02-09: 100 mL via INTRAVENOUS

## 2013-02-09 MED ORDER — ALBUTEROL SULFATE (2.5 MG/3ML) 0.083% IN NEBU
2.5000 mg | INHALATION_SOLUTION | Freq: Once | RESPIRATORY_TRACT | Status: AC
  Administered 2013-02-09: 2.5 mg via RESPIRATORY_TRACT
  Filled 2013-02-09: qty 3

## 2013-02-09 NOTE — Discharge Instructions (Signed)
Your caregiver has diagnosed you as having chest pain that is not specific for one problem, but does not require admission.  You are at low risk for an acute heart condition or other serious illness. Chest pain comes from many different causes.  SEEK IMMEDIATE MEDICAL ATTENTION IF: You have severe chest pain, especially if the pain is crushing or pressure-like and spreads to the arms, back, neck, or jaw, or if you have sweating, nausea (feeling sick to your stomach), or shortness of breath. THIS IS AN EMERGENCY. Don't wait to see if the pain will go away. Get medical help at once. Call 911 or 0 (operator). DO NOT drive yourself to the hospital.  Your chest pain gets worse and does not go away with rest.  You have an attack of chest pain lasting longer than usual, despite rest and treatment with the medications your caregiver has prescribed.  You wake from sleep with chest pain or shortness of breath.  You feel dizzy or faint.  You have chest pain not typical of your usual pain for which you originally saw your caregiver.  Please take you medicaitons as directed.  Hypertension As your heart beats, it forces blood through your arteries. This force is your blood pressure. If the pressure is too high, it is called hypertension (HTN) or high blood pressure. HTN is dangerous because you may have it and not know it. High blood pressure may mean that your heart has to work harder to pump blood. Your arteries may be narrow or stiff. The extra work puts you at risk for heart disease, stroke, and other problems.  Blood pressure consists of two numbers, a higher number over a lower, 110/72, for example. It is stated as "110 over 72." The ideal is below 120 for the top number (systolic) and under 80 for the bottom (diastolic). Write down your blood pressure today. You should pay close attention to your blood pressure if you have certain conditions such as:  Heart failure.  Prior heart  attack.  Diabetes  Chronic kidney disease.  Prior stroke.  Multiple risk factors for heart disease. To see if you have HTN, your blood pressure should be measured while you are seated with your arm held at the level of the heart. It should be measured at least twice. A one-time elevated blood pressure reading (especially in the Emergency Department) does not mean that you need treatment. There may be conditions in which the blood pressure is different between your right and left arms. It is important to see your caregiver soon for a recheck. Most people have essential hypertension which means that there is not a specific cause. This type of high blood pressure may be lowered by changing lifestyle factors such as:  Stress.  Smoking.  Lack of exercise.  Excessive weight.  Drug/tobacco/alcohol use.  Eating less salt. Most people do not have symptoms from high blood pressure until it has caused damage to the body. Effective treatment can often prevent, delay or reduce that damage. TREATMENT  When a cause has been identified, treatment for high blood pressure is directed at the cause. There are a large number of medications to treat HTN. These fall into several categories, and your caregiver will help you select the medicines that are best for you. Medications may have side effects. You should review side effects with your caregiver. If your blood pressure stays high after you have made lifestyle changes or started on medicines,   Your medication(s) may need to  be changed.  Other problems may need to be addressed.  Be certain you understand your prescriptions, and know how and when to take your medicine.  Be sure to follow up with your caregiver within the time frame advised (usually within two weeks) to have your blood pressure rechecked and to review your medications.  If you are taking more than one medicine to lower your blood pressure, make sure you know how and at what times they  should be taken. Taking two medicines at the same time can result in blood pressure that is too low. SEEK IMMEDIATE MEDICAL CARE IF:  You develop a severe headache, blurred or changing vision, or confusion.  You have unusual weakness or numbness, or a faint feeling.  You have severe chest or abdominal pain, vomiting, or breathing problems. MAKE SURE YOU:   Understand these instructions.  Will watch your condition.  Will get help right away if you are not doing well or get worse. Document Released: 01/07/2005 Document Revised: 04/01/2011 Document Reviewed: 08/28/2007 [facility] Patient Information 2014 [place].  Pulmonary Edema Pulmonary edema (PE) is a condition in which fluid collects in the lungs. This makes it hard to breathe. PE may be a result of the heart not pumping very well or a result of injury.  CAUSES   Coronary artery disease causes blockages in the arteries of the heart. This deprives the heart muscle of oxygen and weakens the muscle. A heart attack is a form of coronary artery disease.  High blood pressure causes the heart muscle to work harder than usual. Over time, the heart muscle may get stiff, and it starts to work less efficiently. It may also fatigue and weaken.  Viral infection of the heart (myocarditis) may weaken the heart muscle.  Metabolic conditions such as thyroid disease, excessive alcohol use, certain vitamin deficiencies, or diabetes may also weaken the heart muscle.  Leaky or stiff heart valves may impair normal heart function.  Lung disease may strain the heart muscle.  Excessive demands on the heart such as too much salt or fluid intake.  Failure to take prescribed medicines.  Lung injury from heat or toxins, such as poisonous gas.  Infection in the lungs or other parts of the body.  Fluid overload caused by kidney failure or medicines. SYMPTOMS   Shortness of breath at rest or with exertion.  Grunting, wheezing, or gurgling  while breathing.  Feeling like you cannot get enough air.  Breaths are shallow and fast.  A lot of coughing with frothy or bloody mucus.  Skin may become cool, damp, and turn a pale or bluish color. DIAGNOSIS  Initial diagnosis may be based on your history, symptoms, and a physical examination. Additional tests for PE may include:  Electrocardiography.  Chest X-ray.  Blood tests.  Stress test.  Ultrasound evaluation of the heart (echocardiography).  Evaluation by a heart doctor (cardiologist).  Test of the heart arteries to look for blockages (angiography).  Check of blood oxygen. TREATMENT  Treatment of PE will depend on the underlying cause and will focus first on relieving the symptoms.   Extra oxygen to make breathing easier and assist with removing mucus. This may include breathing treatments or a tube into the lungs and a breathing machine.  Medicine to help the body get rid of extra water, usually through an IV tube.  Medicine to help the heart pump better.  If poor heart function is the cause, treatment may include:  Procedures to open blocked arteries,  repair damaged heart valves, or remove some of the damaged heart muscle.  A pacemaker to help the heart pump with less effort. HOME CARE INSTRUCTIONS   Your health care provider will help you determine what type of exercise program may be helpful. It is important to maintain strength and increase it if possible. Pace your activities to avoid shortness of breath or chest pain. Rest for at least 1 hour before and after meals. Cardiac rehabilitation programs are available in some locations.  Eat a heart healthy diet low in salt, saturated fat, and cholesterol. Ask for help with choices.  Make a list of every medicine, vitamin, or herbal supplement you are taking. Keep the list with you at all times. Show it to your health care provider at every visit and before starting a new medicine. Keep the list up to  date.  Ask your health care provider or pharmacist to help you write a plan or schedule so that you know things about each medicine such as:  Why you are taking it.  The possible side effects.  The best time of day to take it.  Foods to take with it or avoid.  When to stop taking it.  Record your hospital or clinic weight. When you get home, compare it to your scale and record your weight. Then, weigh yourself first thing in the morning daily, and record the weights. You should weigh yourself every morning after you urinate and before you eat breakfast. Wear the same amount of clothing each time you weigh yourself. Provide your health care provider with your weight record. Daily weights are important in the early recognition of excess fluid. Tell your health care provider right away if you have gained 03 lb/1.4 kg in 1 day, 05 lb/2.3 kg in a week or as directed by your health care provider. Your medicines may need to be adjusted.  Blood pressure monitoring should be done as often as directed. You can get a home blood pressure cuff at your drugstore. Record these values and bring them with you for your clinic visits. Notify your health care provider if you become dizzy or lightheaded when standing up.  If you are currently a smoker, it is time to quit. Nicotine makes your heart work harder and is one of the leading causes of cardiac deaths. Do not use nicotine gum or patches before talking to your doctor.  Make a follow-up appointment with your health care provider as directed.  Ask your health care provider for a copy of your latest heart tracing (ECG) and keep a copy with you at all times. SEEK IMMEDIATE MEDICAL CARE IF:   You have severe chest pain, especially if the pain is crushing or pressure-like and spreads to the arms, back, neck, or jaw. THIS IS AN EMERGENCY. Do not wait to see if the pain will go away. Call for local emergency medical help. Do not drive yourself to the  hospital.  You have sweating, feel sick to your stomach (nauseous), or are experiencing shortness of breath.  Your weight increases by 03 lb/1.4 kg in 1 day or 05 lb/2.3 kg in a week.  You notice increasing shortness of breath that is unusual for you. This may happen during rest, sleep, or with activity.  You develop chest pain (angina) or pain that is unusual for you.  You notice more swelling in your hands, feet, ankles or abdomen.  You notice lasting (persistent) dizziness, blurred vision, headache, or unsteadiness.  You begin to  cough up bloody mucus (sputum).  You are unable to sleep because it is hard to breathe.  You begin to feel a "jumping" or "fluttering" sensation (palpitations) in the chest that is unusual for you. MAKE SURE YOU:  Understand these instructions.  Will watch your condition  Will get help right away if you are not doing well or get worse. Document Released: 03/30/2002 Document Revised: 10/28/2012 Document Reviewed: 09/14/2012 [facility] Patient Information 2014 [place].  Shortness of Breath Shortness of breath means you have trouble breathing. Shortness of breath may indicate that you have a medical problem. You should seek immediate medical care for shortness of breath. CAUSES   Not enough oxygen in the air (as with high altitudes or a smoke-filled room).  Short-term (acute) lung disease, including:  Infections, such as pneumonia.  Fluid in the lungs, such as heart failure.  A blood clot in the lungs (pulmonary embolism).  Long-term (chronic) lung diseases.  Heart disease (heart attack, angina, heart failure, and others).  Low red blood cells (anemia).  Poor physical fitness. This can cause shortness of breath when you exercise.  Chest or back injuries or stiffness.  Being overweight.  Smoking.  Anxiety. This can make you feel like you are not getting enough air. DIAGNOSIS  Serious medical problems can usually be found during  your physical exam. Tests may also be done to determine why you are having shortness of breath. Tests may include:  Chest X-rays.  Lung function tests.  Blood tests.  Electrocardiography.  Exercise testing.  Echocardiography.  Imaging scans. Your caregiver may not be able to find a cause for your shortness of breath after your exam. In this case, it is important to have a follow-up exam with your caregiver as directed.  TREATMENT  Treatment for shortness of breath depends on the cause of your symptoms and can vary greatly. HOME CARE INSTRUCTIONS   Do not smoke. Smoking is a common cause of shortness of breath. If you smoke, ask for help to quit.  Avoid being around chemicals or things that may bother your breathing, such as paint fumes and dust.  Rest as needed. Slowly resume your usual activities.  If medicines were prescribed, take them as directed for the full length of time directed. This includes oxygen and any inhaled medicines.  Keep all follow-up appointments as directed by your caregiver. SEEK MEDICAL CARE IF:   Your condition does not improve in the time expected.  You have a hard time doing your normal activities even with rest.  You have any side effects or problems with the medicines prescribed.  You develop any new symptoms. SEEK IMMEDIATE MEDICAL CARE IF:   Your shortness of breath gets worse.  You feel lightheaded, faint, or develop a cough not controlled with medicines.  You start coughing up blood.  You have pain with breathing.  You have chest pain or pain in your arms, shoulders, or abdomen.  You have a fever.  You are unable to walk up stairs or exercise the way you normally do. MAKE SURE YOU:  Understand these instructions.  Will watch your condition.  Will get help right away if you are not doing well or get worse. Document Released: 10/02/2000 Document Revised: 07/09/2011 Document Reviewed: 03/25/2011 [facility] Patient Information  2014 [place].

## 2013-02-09 NOTE — ED Notes (Signed)
Pt states that she went to bed feeling fine, woke up about 2am short of breath and her chest was tight

## 2013-02-09 NOTE — Telephone Encounter (Signed)
New problem   Pt need to speak to nurse concerning her visit to ER on today.

## 2013-02-09 NOTE — ED Notes (Signed)
Ambulated patient o2 96.

## 2013-02-09 NOTE — ED Provider Notes (Signed)
Medical screening examination/treatment/procedure(s) were conducted as a shared visit with non-physician practitioner(s) and myself.  I personally evaluated the patient during the encounter.  EKG Interpretation   None       Patient here with SOB. Seen earlier in the ED - given 40 mg of lasix, had BNP < 200, remainder of labs normal. Returned with persistent SOB. Patient had positive dimer here, negative CT PE study. Another dose of lasix given. Patient with stable vitals, states she is feeling better. Stable for discharge.   Osvaldo Shipper, MD 02/09/13 [phone]

## 2013-02-09 NOTE — Telephone Encounter (Signed)
Calling stating she was in ER this AM as was diagnosed with pulmonary edema.  States they gave her IV Lasix at hospital.  Calling now stating she is more SOB since she got home.  Advised that she needs to go back to ER.  She does state that she has someone with her that can take her.  She understands and will return to ER.

## 2013-02-09 NOTE — ED Provider Notes (Signed)
CSN: [id number]     Arrival date & time 02/09/13  0539 History   First MD Initiated Contact with Patient 02/09/13 [phone]     Chief Complaint  Patient presents with  . Shortness of Breath  . Hypertension   (Consider location/radiation/quality/duration/timing/severity/associated sxs/prior Treatment) HPI   this is a 53 year old female with a past medical history of morbid obesity,obstructive sleep apnea, diabetes mellitus, CHF, hypertension, history of MI, osteoarthritis of the knees.  Patient presents with acute onset dyspnea.  Patient states she was sleeping with her CPAP when she was awoken from sleep at approximately 2 AM with a feeling of being stifled.  The patient sat up and was able to breathe a bit better.  She complains of severe dyspnea with exertion, feeling of left-sided chest pressure, discomfort with deep inhalation, mild nausea.  The patient denies chest pain, wheezing, vomiting, diaphoresis.  She denies any recent history of upper respiratory infection, history of DVT, unilateral leg swelling, exogenous estrogens, smoking, recent surgeries.  The patient denies any worsening dyspnea over the past few weeks.  The patient does state that about one week ago she was begun on Mobic for her knee pain.  She states that her blood pressures have been elevated since that time.  She has been taking her other medications as directed.  The patient denies any recent peripheral edema worse than her usual.  Denies fevers, chills, myalgias, arthralgias. Denies dysuria, flank pain, suprapubic pain, frequency, urgency, or hematuria. Denies headaches, light headedness, weakness, visual disturbances. Denies abdominal pain, nausea, vomiting, diarrhea or constipation.     Past Medical History  Diagnosis Date  . Hypokalemia   . CHF (congestive heart failure)   . Unspecified adverse effect of unspecified drug, medicinal and biological substance   . Otitis media   . External otitis   . Sinusitis   .  Plantar fasciitis   . Family history of ischemic heart disease   . Common migraine   . Morbid obesity   . Hyperlipidemia   . OSA (obstructive sleep apnea)   . Carpal tunnel syndrome   . Hypertension   . CAD (coronary artery disease)   . Allergic rhinitis   . Routine general medical examination at a health care facility   . Shoulder and upper arm, insect bite, nonvenomous, without mention of infection(912.4)   . Unspecified adverse effect of unspecified drug, medicinal and biological substance   . Acute serous otitis media   . Tubal ligation status   . Old myocardial infarction   . CPAP (continuous positive airway pressure) dependence   . Sleep apnea    Past Surgical History  Procedure Laterality Date  . Tubal ligation    . Tonsillectomy    . Uvulopalatopharyngoplasty    . Cesarean section    . Nasal sinus surgery    . Partial hysterectomy     Family History  Problem Relation Age of Onset  . Coronary artery disease      1st degree relatvie <50  . Breast cancer      aunt- ? paternal or maternal  . Asthma Sister   . Heart disease Mother    History  Substance Use Topics  . Smoking status: Never Smoker   . Smokeless tobacco: Never Used  . Alcohol Use: No   OB History   Grav Para Term Preterm Abortions TAB SAB Ect Mult Living                 Review of Systems Ten  systems reviewed and are negative for acute change, except as noted in the HPI.   Allergies  Doxycycline; Latex; Ciprofloxacin; Fish allergy; Hydrocortisone; Neomycin; and Sulfonamide derivatives  Home Medications   Current Outpatient Rx  Name  Route  Sig  Dispense  Refill  . albuterol (PROVENTIL HFA;VENTOLIN HFA) 108 (90 BASE) MCG/ACT inhaler   Inhalation   Inhale 2 puffs into the lungs every 4 (four) hours as needed for wheezing.         . cloNIDine (CATAPRES) 0.2 MG tablet   Oral   Take 1 tablet (0.2 mg total) by mouth 2 (two) times daily.   60 tablet   1   . famotidine (PEPCID) 20 MG  tablet      One at bedtime   30 tablet   11   . fexofenadine-pseudoephedrine (ALLEGRA-D 24) 180-240 MG per 24 hr tablet   Oral   Take 1 tablet by mouth daily.         . fluticasone (FLONASE) 50 MCG/ACT nasal spray   Nasal   Place 2 sprays into the nose daily. For allergies         . furosemide (LASIX) 40 MG tablet   Oral   Take 2 tablets (80 mg total) by mouth daily as needed. For swelling or fluid retention.   30 tablet   6   . losartan-hydrochlorothiazide (HYZAAR) 100-25 MG per tablet   Oral   Take 1 tablet by mouth daily.   30 tablet   1   . naproxen sodium (ANAPROX) 220 MG tablet   Oral   Take 440 mg by mouth 2 (two) times daily with a meal.          BP 211/109  Pulse 73  Temp(Src) 98 F (36.7 C) (Oral)  Resp 32  SpO2 98% Physical Exam Physical Exam  Nursing note and vitals reviewed. Constitutional: She is oriented to person, place, and time.  Morbidly obese female tachypneic, mild respiratory distress. HENT:  Head: Normocephalic and atraumatic.  bilateral Peri-Orbital and facial edema. Eyes: Conjunctivae normal and EOM are normal. Pupils are equal, round, and reactive to light. No scleral icterus.  Neck: Normal range of motion.  Cardiovascular: Normal rate, regular rhythm and normal heart sounds.  Exam reveals no gallop and no friction rub.  Bilateral 1+ pitting peripheral edema.  No calf tenderness. No murmur heard. Pulmonary/Chest: Tachypneic, increased effort, no wheezes, slight crackles in the left lung base. Abdominal: Soft. Bowel sounds are normal. She exhibits no distension and no mass. There is no tenderness. There is no guarding.  Neurological: She is alert and oriented to person, place, and time.  Skin: Skin is warm and dry. She is not diaphoretic.    ED Course  Procedures (including critical care time) Labs Review Labs Reviewed  PRO B NATRIURETIC PEPTIDE - Abnormal; Notable for the following:    Pro B Natriuretic peptide (BNP) 193.2 (*)     All other components within normal limits  COMPREHENSIVE METABOLIC PANEL - Abnormal; Notable for the following:    Potassium 3.6 (*)    Glucose, Bld 120 (*)    Albumin 3.0 (*)    All other components within normal limits  GLUCOSE, CAPILLARY - Abnormal; Notable for the following:    Glucose-Capillary 111 (*)    All other components within normal limits  CBC WITH DIFFERENTIAL  URINALYSIS, ROUTINE W REFLEX MICROSCOPIC  TROPONIN I  POCT I-STAT TROPONIN I   Imaging Review No results found.  EKG Interpretation  None       Date: 02/09/2013  Rate: 67  Rhythm: normal sinus rhythm  QRS Axis: normal  Intervals: normal  ST/T Wave abnormalities: normal  Conduction Disutrbances:none  Narrative Interpretation:   Old EKG Reviewed: unchanged and old anteroseptal infarct    MDM  No diagnosis found. Filed Vitals:   02/09/13 0542  BP: 211/109  Pulse: 73  Temp: 98 F (36.7 C)  TempSrc: Oral  Resp: 32  SpO2: 98%    Patient with acute onset shortness of breath and hypertension Differential includes MI, pulmonary edema, pulmonary embolus, hypertensive emergency. Patient is seen in chair visit with Dr.Nanavati. Awaiting CXR . Labs in place.  EKG unchanged. I personally reviewed the images using our PACS system. PE is very low on my differential. She is Wells low risk. I have discussed with my attending physician who feels we do not need to pursue this work up currently.   Filed Vitals:   02/09/13 0930 02/09/13 1000 02/09/13 1015 02/09/13 1030  BP: 140/67 133/78 134/53 130/59  Pulse: 71 76 74 71  Temp:      TempSrc:      Resp: 18 17 16 15   SpO2: 97% 99% 96% 98%   Patient delta troponin negative. Able to ambulate on RA at 96% 02 sats. Lung sounds clear,normal RR. Pateint looks to be breathing well.  She reports being more comfortable. Her BP has decreased sig.  Mild hyperglycemia. Labs/ekg/cxr otherwise unremarkable. The patient appears reasonably screened and/or  stabilized for discharge and I doubt any other medical condition or other [facility] requiring further screening, evaluation, or treatment in the ED at this time prior to discharge.   Margarita Mail, PA-C 02/10/13 [phone]

## 2013-02-09 NOTE — Discharge Instructions (Signed)
Shortness of Breath  Shortness of breath means you have trouble breathing. Shortness of breath needs medical care right away.  HOME CARE   · Do not smoke.  · Avoid being around chemicals or things (paint fumes, dust) that may bother your breathing.  · Rest as needed. Slowly begin your normal activities.  · Only take medicines as told by your doctor.  · Keep all doctor visits as told.  GET HELP RIGHT AWAY IF:   · Your shortness of breath gets worse.  · You feel lightheaded, pass out (faint), or have a cough that is not helped by medicine.  · You cough up blood.  · You have pain with breathing.  · You have pain in your chest, arms, shoulders, or belly (abdomen).  · You have a fever.  · You cannot walk up stairs or exercise the way you normally do.  · You do not get better in the time expected.  · You have a hard time doing normal activities even with rest.  · You have problems with your medicines.  · You have any new symptoms.  MAKE SURE YOU:  · Understand these instructions.  · Will watch your condition.  · Will get help right away if you are not doing well or get worse.  Document Released: 06/26/2007 Document Revised: 07/09/2011 Document Reviewed: 03/25/2011  Exit[employer]® Patient Information ©2014 [employer].

## 2013-02-09 NOTE — Telephone Encounter (Signed)
Follow up       Pt seen Dr Percival Spanish in the ER and is now having problems breathing. Transferred call to triage.

## 2013-02-09 NOTE — ED Provider Notes (Signed)
CSN: [id number]     Arrival date & time 02/09/13  1422 History   First MD Initiated Contact with Patient 02/09/13 1512     Chief Complaint  Patient presents with  . Shortness of Breath   (Consider location/radiation/quality/duration/timing/severity/associated sxs/prior Treatment) HPI Pt is a morbidly obese 53yo female with hx of CHF, sleep apnea, CAD, and HTN was seen earlier this morning for SOB, shown to have mild interstitial edema.  Pt had a negative troponin, normal EKG, and only mildy elevated BNP.  Pt was discharged home 3 hours PTA. Pt experienced worsening SOB after leaving ED so she called her cardiologist, Dr. Percival Spanish, who advised her to return to the ED. Pt states doctor told her that we "didn't take enough fluid off."  Reports symptoms started just this morning, awaking her from sleep.  Denies previous hospitalization for CHF. Denies hx of DVT, PE, recent surgery. Does report train ride to and from [place] on Jan 8th returned Jan 14th.  Denies increased leg pain or swelling.    Past Medical History  Diagnosis Date  . Hypokalemia   . CHF (congestive heart failure)   . Unspecified adverse effect of unspecified drug, medicinal and biological substance   . Otitis media   . External otitis   . Sinusitis   . Plantar fasciitis   . Family history of ischemic heart disease   . Common migraine   . Morbid obesity   . Hyperlipidemia   . OSA (obstructive sleep apnea)   . Carpal tunnel syndrome   . Hypertension   . CAD (coronary artery disease)   . Allergic rhinitis   . Routine general medical examination at a health care facility   . Shoulder and upper arm, insect bite, nonvenomous, without mention of infection(912.4)   . Unspecified adverse effect of unspecified drug, medicinal and biological substance   . Acute serous otitis media   . Tubal ligation status   . Old myocardial infarction   . CPAP (continuous positive airway pressure) dependence   . Sleep apnea    Past Surgical  History  Procedure Laterality Date  . Tubal ligation    . Tonsillectomy    . Uvulopalatopharyngoplasty    . Cesarean section    . Nasal sinus surgery    . Partial hysterectomy     Family History  Problem Relation Age of Onset  . Coronary artery disease      1st degree relatvie <50  . Breast cancer      aunt- ? paternal or maternal  . Asthma Sister   . Heart disease Mother    History  Substance Use Topics  . Smoking status: Never Smoker   . Smokeless tobacco: Never Used  . Alcohol Use: No   OB History   Grav Para Term Preterm Abortions TAB SAB Ect Mult Living                 Review of Systems  Constitutional: Positive for fatigue. Negative for fever, chills and diaphoresis.  Respiratory: Positive for shortness of breath. Negative for cough.   Cardiovascular: Negative for chest pain.  Gastrointestinal: Negative for nausea, vomiting, abdominal pain and diarrhea.  All other systems reviewed and are negative.    Allergies  Doxycycline; Latex; Ciprofloxacin; Fish allergy; Hydrocortisone; Neomycin; and Sulfonamide derivatives  Home Medications   Current Outpatient Rx  Name  Route  Sig  Dispense  Refill  . acetaminophen (TYLENOL) 325 MG tablet   Oral   Take 650 mg by  mouth every 6 (six) hours as needed.         [name] albuterol (PROVENTIL HFA;VENTOLIN HFA) 108 (90 BASE) MCG/ACT inhaler   Inhalation   Inhale 2 puffs into the lungs every 4 (four) hours as needed for wheezing.         . cloNIDine (CATAPRES) 0.2 MG tablet   Oral   Take 1 tablet (0.2 mg total) by mouth 2 (two) times daily.   60 tablet   1   . fluticasone (FLONASE) 50 MCG/ACT nasal spray   Nasal   Place 2 sprays into the nose daily. For allergies         . furosemide (LASIX) 40 MG tablet   Oral   Take 2 tablets (80 mg total) by mouth daily as needed. For swelling or fluid retention.   30 tablet   6   . HYDROcodone-acetaminophen (NORCO/VICODIN) 5-325 MG per tablet   Oral   Take 1 tablet by  mouth every 6 (six) hours as needed for moderate pain (pain).         [name] losartan-hydrochlorothiazide (HYZAAR) 100-25 MG per tablet   Oral   Take 1 tablet by mouth daily.   30 tablet   1   . Multiple Vitamins-Minerals (MULTIVITAMIN PO)   Oral   Take 1 tablet by mouth daily.         . traMADol (ULTRAM) 50 MG tablet   Oral   Take 50 mg by mouth every 6 (six) hours as needed (pain).          BP 164/74  Pulse 60  Temp(Src) 98.2 F (36.8 C) (Oral)  Resp 21  SpO2 94% Physical Exam  Nursing note and vitals reviewed. Constitutional: She appears well-developed and well-nourished. No distress.  Morbidly obese female, lying in exam bed. Mild increased respiratory effort.  HENT:  Head: Normocephalic and atraumatic.  Eyes: Conjunctivae are normal. No scleral icterus.  Neck: Normal range of motion.  Cardiovascular: Normal rate, regular rhythm and normal heart sounds.   Pulmonary/Chest: Effort normal and breath sounds normal. No respiratory distress. She has no wheezes. She has no rales. She exhibits no tenderness.  Abdominal: Soft. Bowel sounds are normal. She exhibits no distension and no mass. There is no tenderness. There is no rebound and no guarding.  Musculoskeletal: Normal range of motion. She exhibits edema ( 1+ bilateral pitting edema). She exhibits no tenderness.  Neurological: She is alert.  Skin: Skin is warm and dry. She is not diaphoretic.    ED Course  Procedures (including critical care time) Labs Review Labs Reviewed  D-DIMER, QUANTITATIVE - Abnormal; Notable for the following:    D-Dimer, Quant 0.74 (*)    All other components within normal limits   Imaging Review Dg Chest 2 View  02/09/2013   CLINICAL DATA:  Chest tightness, shortness of breath and cough.  EXAM: CHEST  2 VIEW  COMPARISON:  Chest radiograph performed 12/29/2012  FINDINGS: The lungs are well-aerated. Vascular congestion is noted. Mildly increased interstitial markings are seen, raising concern  for mild interstitial edema. There is no evidence of pleural effusion or pneumothorax.  The heart is mildly enlarged. No acute osseous abnormalities are seen.  IMPRESSION: Vascular congestion and mild cardiomegaly, with mildly increased interstitial markings, raising concern for mild interstitial edema.   Electronically Signed   By: Garald Balding M.D.   On: 02/09/2013 07:02    EKG Interpretation   None       MDM   1. SOB (  shortness of breath)    Pt c/o SOB, was worked up earlier this morning, found to have mild interstitial edema.  Medical records reviewed.  Not concerned for ACS at this time or pneumonia. Will work up for PE. Will give 20mg  lasix as cardiologist advised pt we "didn't take off enough fluid."  Will also give albuterol tx and get D-dimer as this was the only lab not performed this morning.  Besides elevated BP, vitals unremarkable, not a large concern for PE, however pt still c/o SOB and has hx of traveling on train 2 weeks ago to and from [place].    Pt does state she feels better after breathing tx and lasix, however D-dimer came back mildly elevated at 0.74  (range: 0.0-0.48)  Will get CT angio chest to check for PE.    CT angio chest: no evidence of pulmonary embolism. No acute chest findings.    All labs/imaging/findings discussed with patient. All questions answered and concerns addressed. Will discharge pt home and have pt f/u with her PCP later this week and to call her cardiologist, Dr. Percival Spanish tomorrow to discuss possible medication change. Rx: albuterol inhaler. Return precautions given. Pt verbalized understanding and agreement with tx plan. Vitals: unremarkable. Discharged in stable condition.    Discussed pt with Dr. Mingo Amber during ED encounter and agrees with plan.     Noland Fordyce, PA-C 02/09/13 [place], PA-C 02/09/13 1857

## 2013-02-09 NOTE — Telephone Encounter (Signed)
Returned call to patient she stated she went to [facility] ER this morning with chest pain.Stated lab work came back normal. Stated ER Dr.advised her to take Lasix 40 mg daily.Patient wanted to know if ok with Dr.Hochrein.Patient has a follow up appointment with Dr.Hochrein 02/26/13.Advised to keep appointment .Message sent to [place].

## 2013-02-09 NOTE — ED Notes (Signed)
Pt states that she was seen here today and was d/c about 3 hrs ago.  Called her primary doctor and told her that she didn't feel any better so they told her to come back to the ER.  Pt c/o SOB.  Her doctor told her that we "didn't take enough fluid off".

## 2013-02-10 ENCOUNTER — Encounter: Payer: Self-pay | Admitting: Internal Medicine

## 2013-02-10 NOTE — Telephone Encounter (Signed)
Error

## 2013-02-10 NOTE — ED Provider Notes (Signed)
Medical screening examination/treatment/procedure(s) were conducted as a shared visit with non-physician practitioner(s) and myself.  I personally evaluated the patient during the encounter.  EKG Interpretation   None       Pt comes in with cc of dib. She is noted to be hypertensive, and reports acute dyspnea. Pt's lung exam eas benign, CXR shows mild congestion. She is breathing a lot comfortable on my reassessment. Doubt PE - as the sx are acute. Will get better BP control. Lasix will be given in the ED Abigail harris to reassess her clinically prior to making a final disposition. If patient looks comfortable. BP better, and she can ambulate - she will be discharged.   Varney Biles, MD 02/10/13 [phone]

## 2013-02-11 ENCOUNTER — Emergency Department (HOSPITAL_COMMUNITY)
Admission: EM | Admit: 2013-02-11 | Discharge: 2013-02-11 | Disposition: A | Discharge status: Home / Self Care | Payer: No Typology Code available for payment source | Attending: Emergency Medicine | Admitting: Emergency Medicine

## 2013-02-11 ENCOUNTER — Emergency Department (HOSPITAL_COMMUNITY): Payer: No Typology Code available for payment source

## 2013-02-11 ENCOUNTER — Telehealth: Payer: Self-pay | Admitting: Internal Medicine

## 2013-02-11 ENCOUNTER — Encounter (HOSPITAL_COMMUNITY): Payer: Self-pay | Admitting: Emergency Medicine

## 2013-02-11 ENCOUNTER — Other Ambulatory Visit: Payer: Self-pay | Admitting: Cardiology

## 2013-02-11 DIAGNOSIS — I2510 Atherosclerotic heart disease of native coronary artery without angina pectoris: Secondary | ICD-10-CM | POA: Insufficient documentation

## 2013-02-11 DIAGNOSIS — I509 Heart failure, unspecified: Secondary | ICD-10-CM | POA: Insufficient documentation

## 2013-02-11 DIAGNOSIS — R1084 Generalized abdominal pain: Secondary | ICD-10-CM | POA: Insufficient documentation

## 2013-02-11 DIAGNOSIS — G8929 Other chronic pain: Secondary | ICD-10-CM | POA: Insufficient documentation

## 2013-02-11 DIAGNOSIS — I10 Essential (primary) hypertension: Secondary | ICD-10-CM | POA: Insufficient documentation

## 2013-02-11 DIAGNOSIS — Z8709 Personal history of other diseases of the respiratory system: Secondary | ICD-10-CM | POA: Insufficient documentation

## 2013-02-11 DIAGNOSIS — R109 Unspecified abdominal pain: Secondary | ICD-10-CM

## 2013-02-11 DIAGNOSIS — Z91040 Latex allergy status: Secondary | ICD-10-CM | POA: Insufficient documentation

## 2013-02-11 DIAGNOSIS — G43009 Migraine without aura, not intractable, without status migrainosus: Secondary | ICD-10-CM | POA: Insufficient documentation

## 2013-02-11 DIAGNOSIS — Z79899 Other long term (current) drug therapy: Secondary | ICD-10-CM | POA: Insufficient documentation

## 2013-02-11 DIAGNOSIS — R079 Chest pain, unspecified: Secondary | ICD-10-CM | POA: Insufficient documentation

## 2013-02-11 DIAGNOSIS — Z9851 Tubal ligation status: Secondary | ICD-10-CM | POA: Insufficient documentation

## 2013-02-11 DIAGNOSIS — I252 Old myocardial infarction: Secondary | ICD-10-CM | POA: Insufficient documentation

## 2013-02-11 DIAGNOSIS — IMO0002 Reserved for concepts with insufficient information to code with codable children: Secondary | ICD-10-CM | POA: Insufficient documentation

## 2013-02-11 DIAGNOSIS — G4733 Obstructive sleep apnea (adult) (pediatric): Secondary | ICD-10-CM | POA: Insufficient documentation

## 2013-02-11 DIAGNOSIS — Z8739 Personal history of other diseases of the musculoskeletal system and connective tissue: Secondary | ICD-10-CM | POA: Insufficient documentation

## 2013-02-11 DIAGNOSIS — R110 Nausea: Secondary | ICD-10-CM | POA: Insufficient documentation

## 2013-02-11 LAB — POCT I-STAT TROPONIN I: TROPONIN I, POC: 0.02 ng/mL (ref 0.00–0.08)

## 2013-02-11 LAB — COMPREHENSIVE METABOLIC PANEL
ALT: 22 U/L (ref 0–35)
AST: 24 U/L (ref 0–37)
Albumin: 4.2 g/dL (ref 3.5–5.2)
Alkaline Phosphatase: 77 U/L (ref 39–117)
BUN: 6 mg/dL (ref 6–23)
CO2: 29 mEq/L (ref 19–32)
CREATININE: 0.57 mg/dL (ref 0.50–1.10)
Calcium: 9.6 mg/dL (ref 8.4–10.5)
Chloride: 96 mEq/L (ref 96–112)
GFR calc Af Amer: >90 mL/min (ref >90)
GFR calc non Af Amer: >90 mL/min (ref >90)
Glucose, Bld: 106 mg/dL — ABNORMAL HIGH (ref 70–99)
Potassium: 3.1 mEq/L — ABNORMAL LOW (ref 3.7–5.3)
Sodium: 140 mEq/L (ref 137–147)
Total Bilirubin: 1 mg/dL (ref 0.3–1.2)
Total Protein: 9.4 g/dL — ABNORMAL HIGH (ref 6.0–8.3)

## 2013-02-11 LAB — URINALYSIS, ROUTINE W REFLEX MICROSCOPIC
BILIRUBIN URINE: NEGATIVE
GLUCOSE, UA: NEGATIVE mg/dL
Hgb urine dipstick: NEGATIVE
KETONES UR: NEGATIVE mg/dL
LEUKOCYTES UA: NEGATIVE
Nitrite: NEGATIVE
PROTEIN: NEGATIVE mg/dL
Specific Gravity, Urine: 1.007 (ref 1.005–1.030)
Urobilinogen, UA: 0.2 mg/dL (ref 0.0–1.0)
pH: 7.5 (ref 5.0–8.0)

## 2013-02-11 LAB — CBC WITH DIFFERENTIAL/PLATELET
BASOS PCT: 0 % (ref 0–1)
Basophils Absolute: 0 10*3/uL (ref 0.0–0.1)
EOS PCT: 2 % (ref 0–5)
Eosinophils Absolute: 0.3 10*3/uL (ref 0.0–0.7)
HEMATOCRIT: 41.5 % (ref 36.0–46.0)
Hemoglobin: 13.9 g/dL (ref 12.0–15.0)
Lymphocytes Relative: 24 % (ref 12–46)
Lymphs Abs: 3.2 10*3/uL (ref 0.7–4.0)
MCH: 31 pg (ref 26.0–34.0)
MCHC: 33.5 g/dL (ref 30.0–36.0)
MCV: 92.6 fL (ref 78.0–100.0)
MONO ABS: 0.8 10*3/uL (ref 0.1–1.0)
MONOS PCT: 6 % (ref 3–12)
NEUTROS ABS: 9.2 10*3/uL — AB (ref 1.7–7.7)
Neutrophils Relative %: 68 % (ref 43–77)
Platelets: 292 10*3/uL (ref 150–400)
RBC: 4.48 MIL/uL (ref 3.87–5.11)
RDW: 14.1 % (ref 11.5–15.5)
WBC: 13.5 10*3/uL — ABNORMAL HIGH (ref 4.0–10.5)

## 2013-02-11 LAB — LIPASE, BLOOD: LIPASE: 23 U/L (ref 11–59)

## 2013-02-11 MED ORDER — LOSARTAN POTASSIUM-HCTZ 100-25 MG PO TABS
1.0000 | ORAL_TABLET | Freq: Every day | ORAL | Status: DC
Start: 2013-02-11 — End: 2013-03-26

## 2013-02-11 MED ORDER — PROMETHAZINE HCL 25 MG PO TABS: 25.0000 mg | ORAL_TABLET | Freq: Three times a day (TID) | ORAL | Status: DC | PRN

## 2013-02-11 MED ORDER — IOHEXOL 300 MG/ML  SOLN
50.0000 mL | Freq: Once | INTRAMUSCULAR | Status: AC | PRN
  Administered 2013-02-11: 50 mL via ORAL

## 2013-02-11 MED ORDER — CLONIDINE HCL 0.2 MG PO TABS: 0.2000 mg | ORAL_TABLET | Freq: Two times a day (BID) | ORAL | Status: DC

## 2013-02-11 MED ORDER — OXYCODONE-ACETAMINOPHEN 5-325 MG PO TABS: 1.0000 | ORAL_TABLET | Freq: Four times a day (QID) | ORAL | Status: DC | PRN

## 2013-02-11 MED ORDER — MORPHINE SULFATE 4 MG/ML IJ SOLN
6.0000 mg | Freq: Once | INTRAMUSCULAR | Status: AC
  Administered 2013-02-11: 6 mg via INTRAVENOUS
  Filled 2013-02-11: qty 2

## 2013-02-11 MED ORDER — IOHEXOL 300 MG/ML  SOLN
100.0000 mL | Freq: Once | INTRAMUSCULAR | Status: AC | PRN
  Administered 2013-02-11: 100 mL via INTRAVENOUS

## 2013-02-11 NOTE — Telephone Encounter (Signed)
Rx sent to [facility] on [address] for Clonidine 0.2 mg, two times daily by mouth and losartan-hydrochlorothiazide 100-25 mg per tablet daily by mouth for 15 day supply until she can afford to pick up her 65 supply from [facility] in early February.

## 2013-02-11 NOTE — Telephone Encounter (Signed)
Patient c/o abdominal pain.  LUQ.  She was scheduled to see Dr. Henrene Pastor for constipation, but she denies constipation.  Is using stool softeners and having daily BM.  She is offered an appt for Nicoletta Ba PA tomorrow at 10:30.

## 2013-02-11 NOTE — Discharge Instructions (Signed)
Followup with your primary care doctor, soon as possible.  Also follow up with her GI doctor.  Return here as needed.  Her CT scan did not show any abnormality

## 2013-02-11 NOTE — ED Notes (Signed)
Patient's son on the way to pick her up Patient in wheelchair out in ED WR

## 2013-02-11 NOTE — ED Notes (Signed)
Pt reports she has been having generalized lower abdominal pain that began on Tuesday. Pt reports having normal bowel movements. Pt reports nausea with PO intake, pt denies emesis or diarrhea. Pt is A/O x4, in NAD, and vital signs are WDL.

## 2013-02-11 NOTE — Telephone Encounter (Signed)
New Problem:  Pt states she has questions about her meds after her recent hospital visit. Pt would like a call back from the nurse.

## 2013-02-11 NOTE — Telephone Encounter (Signed)
Patient states she is out of her BP meds. She was seen in ED on Tuesday for SOB/elevated BP. States she does not have finances for refills of her medications. Wants to know if she should go to ED to get BP meds. This office does not carry samples of these medications. She states she can not afford any medication until January 30th. Medication copay are at a minimum already. Offered to see what resources are there for patient. Patient is experiencing headache and generalized discomfort - "I can tell my pressure is high". Patient has appointment in office on 02/26/13. Offered to assist her in contacting a few resources to see if her medications can be found less expensive. Her insurance benefits only provide patient's medications in 90 day supply and they will not offer them up to be sold in smaller amounts, per pt. (and then verified by pharmacy).  I attempted to call her insurance company, [employer], to reach out to see if there were any programs or resources to assist her with drug coverage or financial assistance. They stated they would not be able to discuss the patient with anyone. I called [employer] in [facility], however they stated they work with Medicare patients only. I called the Education officer, museum for [facility] ED and left VM. Partnership for [facility] only assist patients with no insurance. Called [facility] to do price comparison to [facility] on [facility] price. [facility] prices for the medications through Feb 1st are: $27.39/$11.99.  [facility] prices are: $25.35/$4.00.  Spoke to patient again. Patient states she can afford to pick up the Clonidine from [place]. Encouraged her to check her BP while she is there too. Advised her that if BP is elevated, headache continues or she experiences other symptoms of high blood pressure (dizzy, chest pain, worsening headache), she should proceed to Emergency Department for care.  Patient verbalized understanding and agreement with plan.   Patient voiced appreciation for follow up and assistance. Reiterated importance of going to Emergency Department for elevated BP, especially when other symptoms present, such as her headache.  Patient states she will pick up the Clonidine and check her pressure and she will go to ED if she experiences elevated BP reading or worsening symptoms.  Routed to Dr. Percival Spanish for medication review to see if other meds may be more affordable.

## 2013-02-11 NOTE — ED Provider Notes (Signed)
CSN: [id number]     Arrival date & time 02/11/13  1557 History   First MD Initiated Contact with Patient 02/11/13 1855     Chief Complaint  Patient presents with  . Abdominal Pain  . Chest Pain   (Consider location/radiation/quality/duration/timing/severity/associated sxs/prior Treatment) HPI Patient presents emergency department with abdominal pain, with nausea.  Patient, states, that this is a chronic problem, but is flared up over the last few days.  Patient denies chest pain, shortness breath, headache, blurred vision, weakness, dizziness, fever, or syncope, dysuria, back pain, neck pain rash, or decreased appetite.  The patient, states, that eating and drinking make her nausea, worse.  Patient, states nothing seems to make her condition, better and stated that she was seen in the emergency room x2 for similar pain. Past Medical History  Diagnosis Date  . Hypokalemia   . CHF (congestive heart failure)   . Unspecified adverse effect of unspecified drug, medicinal and biological substance   . Otitis media   . External otitis   . Sinusitis   . Plantar fasciitis   . Family history of ischemic heart disease   . Common migraine   . Morbid obesity   . Hyperlipidemia   . OSA (obstructive sleep apnea)   . Carpal tunnel syndrome   . Hypertension   . CAD (coronary artery disease)   . Allergic rhinitis   . Routine general medical examination at a health care facility   . Shoulder and upper arm, insect bite, nonvenomous, without mention of infection(912.4)   . Unspecified adverse effect of unspecified drug, medicinal and biological substance   . Acute serous otitis media   . Tubal ligation status   . Old myocardial infarction   . CPAP (continuous positive airway pressure) dependence   . Sleep apnea    Past Surgical History  Procedure Laterality Date  . Tubal ligation    . Tonsillectomy    . Uvulopalatopharyngoplasty    . Cesarean section    . Nasal sinus surgery    . Partial  hysterectomy     Family History  Problem Relation Age of Onset  . Coronary artery disease      1st degree relatvie <50  . Breast cancer      aunt- ? paternal or maternal  . Asthma Sister   . Heart disease Mother    History  Substance Use Topics  . Smoking status: Never Smoker   . Smokeless tobacco: Never Used  . Alcohol Use: No   OB History   Grav Para Term Preterm Abortions TAB SAB Ect Mult Living                 Review of Systems All other systems negative except as documented in the HPI. All pertinent positives and negatives as reviewed in the HPI. Allergies  Doxycycline; Latex; Ciprofloxacin; Fish allergy; Hydrocortisone; Neomycin; and Sulfonamide derivatives  Home Medications   Current Outpatient Rx  Name  Route  Sig  Dispense  Refill  . albuterol (PROVENTIL HFA;VENTOLIN HFA) 108 (90 BASE) MCG/ACT inhaler   Inhalation   Inhale 2 puffs into the lungs every 4 (four) hours as needed for wheezing.         . cloNIDine (CATAPRES) 0.2 MG tablet   Oral   Take 1 tablet (0.2 mg total) by mouth 2 (two) times daily.   30 tablet   1   . fluticasone (FLONASE) 50 MCG/ACT nasal spray   Nasal   Place 2 sprays into the  nose daily. For allergies         . furosemide (LASIX) 40 MG tablet   Oral   Take 2 tablets (80 mg total) by mouth daily as needed. For swelling or fluid retention.   30 tablet   6   . HYDROcodone-acetaminophen (NORCO/VICODIN) 5-325 MG per tablet   Oral   Take 1 tablet by mouth every 6 (six) hours as needed for moderate pain (pain).         [name] losartan-hydrochlorothiazide (HYZAAR) 100-25 MG per tablet   Oral   Take 1 tablet by mouth daily.   15 tablet   1   . Multiple Vitamins-Minerals (MULTIVITAMIN PO)   Oral   Take 1 tablet by mouth daily.         . traMADol (ULTRAM) 50 MG tablet   Oral   Take 50 mg by mouth every 6 (six) hours as needed (pain).         [name] acetaminophen (TYLENOL) 325 MG tablet   Oral   Take 650 mg by mouth every 6  (six) hours as needed.          BP 170/87  Pulse 88  Temp(Src) 98.7 F (37.1 C) (Oral)  Resp 20  SpO2 98% Physical Exam  Nursing note and vitals reviewed. Constitutional: She is oriented to person, place, and time. She appears well-developed and well-nourished. No distress.  HENT:  Head: Normocephalic and atraumatic.  Mouth/Throat: Oropharynx is clear and moist.  Eyes: Pupils are equal, round, and reactive to light.  Neck: Normal range of motion. Neck supple.  Cardiovascular: Normal rate, regular rhythm and normal heart sounds.  Exam reveals no gallop and no friction rub.   No murmur heard. Pulmonary/Chest: Effort normal and breath sounds normal. No respiratory distress.  Abdominal: Soft. Normal appearance and bowel sounds are normal. There is generalized tenderness. There is no rigidity, no rebound and no guarding.    Neurological: She is alert and oriented to person, place, and time. She exhibits normal muscle tone. Coordination normal.  Skin: Skin is warm and dry. No rash noted. No erythema.    ED Course  Procedures (including critical care time) Labs Review Labs Reviewed  CBC WITH DIFFERENTIAL - Abnormal; Notable for the following:    WBC 13.5 (*)    Neutro Abs 9.2 (*)    All other components within normal limits  COMPREHENSIVE METABOLIC PANEL - Abnormal; Notable for the following:    Potassium 3.1 (*)    Glucose, Bld 106 (*)    Total Protein 9.4 (*)    All other components within normal limits  URINALYSIS, ROUTINE W REFLEX MICROSCOPIC  LIPASE, BLOOD  POCT I-STAT TROPONIN I   Imaging Review Dg Chest 2 View  02/11/2013   CLINICAL DATA:  Cough, hypertension  EXAM: CHEST  2 VIEW  COMPARISON:  02/09/2013  FINDINGS: Cardiomegaly with slight vascular prominence. No definite CHF, pneumonia, collapse or consolidation. No effusion or pneumothorax. Trachea midline.  IMPRESSION: Cardiomegaly without CHF or pneumonia.  Vascular congestion.   Electronically Signed   By: Daryll Brod M.D.   On: 02/11/2013 17:18   Ct Abdomen Pelvis W Contrast  02/11/2013   CLINICAL DATA:  Left lower quadrant pain for 2 days.  EXAM: CT ABDOMEN AND PELVIS WITH CONTRAST  TECHNIQUE: Multidetector CT imaging of the abdomen and pelvis was performed using the standard protocol following bolus administration of intravenous contrast.  CONTRAST:  125mL OMNIPAQUE IOHEXOL 300 MG/ML  SOLN  COMPARISON:  CT  ABD/PELVIS W CM dated 11/02/2009  FINDINGS: Lower Chest: Clear lung bases. Small hiatal hernia. Moderate cardiomegaly.  Abdomen/Pelvis: Hepatomegaly, 18.5 cm. Possible mild hepatic steatosis. No focal liver lesion. Normal spleen, distal stomach, pancreas. Minimal motion degradation in the mid abdomen. Normal gallbladder, biliary tract, adrenal glands, kidneys. A calcification along the tract of the right ureter is favored to be in the gonadal vein given lack of proximal obstruction. This is unchanged since the prior exam.  No retroperitoneal or retrocrural adenopathy. Scattered colonic diverticula. Normal terminal ileum. Appendix is not visualized but there is no evidence of right lower quadrant inflammation. Normal small bowel without abdominal ascites. No pelvic adenopathy. Hysterectomy. Normal urinary bladder. No adnexal mass or significant free fluid. Tiny fat containing umbilical hernia.  Bones/Musculoskeletal:  No acute osseous abnormality.  IMPRESSION: 1.  No acute process in the abdomen or pelvis. 2. Hepatomegaly.  Probable hepatic steatosis. 3. Small hiatal hernia.   Electronically Signed   By: Abigail Miyamoto M.D.   On: 02/11/2013 22:02    Patient is stable here in the emergency department.  She is feeling better following IV pain medication.  She is asked to followup with her GI Dr. told to return here as needed.  The patient is not having significant findings on CT scan.  This most likely related to her chronic abdominal pain   Brent General, PA-C 02/15/13 1424

## 2013-02-12 ENCOUNTER — Ambulatory Visit: Payer: 59 | Admitting: Physician Assistant

## 2013-02-12 ENCOUNTER — Telehealth: Payer: Self-pay | Admitting: *Deleted

## 2013-02-12 ENCOUNTER — Other Ambulatory Visit: Payer: Self-pay | Admitting: *Deleted

## 2013-02-12 MED ORDER — POTASSIUM CHLORIDE ER 10 MEQ PO TBCR: 10.0000 meq | EXTENDED_RELEASE_TABLET | Freq: Two times a day (BID) | ORAL | Status: DC

## 2013-02-12 NOTE — Telephone Encounter (Signed)
Per Amy Esterwood PA-C, advised the patient her potassium was a little low. We sent a prescription to [facility] for K-Dur 10 MEQ, Take 1 tab twice daily for 5 days.   Also increase your water intake for the constipation.  Take 3 doses of Miralax at one time. Drink 8 oz of water with each dose of 17 grams.  If you move your bowels, then take 1 dose of Miralax daily.  If you don't you can repeat the 3 doses of Miralax either the same day or the next day until you move your bowels.   She said the MD at the ER said she had an infection in the lining of her stomach and he told her to stay on a bland diet for now.  She asked for suggestions as to what she could eat.  I told her, baked potatoes, baked sweet potatoes,  Apple sauce, grits, bananas. Some skinless baked chicken.  Chicken noodle soup.   I told her to keep her appointment with Dr. Henrene Pastor on 03-05-2013 at 9:30 am.   I also told her to call us sooner if she has not mproved.

## 2013-02-14 ENCOUNTER — Telehealth: Payer: Self-pay | Admitting: Internal Medicine

## 2013-02-14 NOTE — Telephone Encounter (Signed)
The patient call with her same abdominal pain. Was recently seen in the ER. Given potassium by our physician assistant. Set up for appointment next month. No severe issues at this time. I recommended that she contact the office tomorrow during regular hours and make arrangements to be seen by an extender this week.

## 2013-02-15 ENCOUNTER — Telehealth: Payer: Self-pay | Admitting: Internal Medicine

## 2013-02-15 MED ORDER — POTASSIUM CHLORIDE ER 10 MEQ PO TBCR
10.0000 meq | EXTENDED_RELEASE_TABLET | Freq: Two times a day (BID) | ORAL | Status: DC
Start: 2013-02-15 — End: 2013-02-19

## 2013-02-15 NOTE — Telephone Encounter (Signed)
OV scheduled, see other phone note.

## 2013-02-15 NOTE — Telephone Encounter (Signed)
Pt scheduled to see Alonza Bogus PA tomorrow at 3pm. Pt aware of appt. Potassium script sent to the incorrect pharmacy. Script sent to [facility] on [address].

## 2013-02-15 NOTE — ED Provider Notes (Signed)
Medical screening examination/treatment/procedure(s) were performed by non-physician practitioner and as supervising physician I was immediately available for consultation/collaboration.  EKG Interpretation    Date/Time:  Thursday February 11 2013 17:00:13 EST Ventricular Rate:  96 PR Interval:  175 QRS Duration: 92 QT Interval:  362 QTC Calculation: 457 R Axis:   141 Text Interpretation:  Sinus rhythm Low voltage with right axis deviation Anteroseptal infarct, old ED PHYSICIAN INTERPRETATION AVAILABLE IN [place] Confirmed by TEST, RECORD ([id number]) on 02/13/2013 12:19:16 PM             Jasper Riling. Alvino Chapel, MD 02/15/13 0932

## 2013-02-16 ENCOUNTER — Encounter: Payer: Self-pay | Admitting: Physician Assistant

## 2013-02-16 ENCOUNTER — Other Ambulatory Visit (INDEPENDENT_AMBULATORY_CARE_PROVIDER_SITE_OTHER): Payer: No Typology Code available for payment source

## 2013-02-16 ENCOUNTER — Ambulatory Visit: Payer: No Typology Code available for payment source | Admitting: Gastroenterology

## 2013-02-16 ENCOUNTER — Ambulatory Visit (INDEPENDENT_AMBULATORY_CARE_PROVIDER_SITE_OTHER): Payer: No Typology Code available for payment source | Admitting: Physician Assistant

## 2013-02-16 VITALS — BP 122/82 | HR 88 | Ht 63.0 in | Wt 302.8 lb

## 2013-02-16 DIAGNOSIS — R109 Unspecified abdominal pain: Secondary | ICD-10-CM

## 2013-02-16 DIAGNOSIS — R1032 Left lower quadrant pain: Secondary | ICD-10-CM

## 2013-02-16 DIAGNOSIS — K5732 Diverticulitis of large intestine without perforation or abscess without bleeding: Secondary | ICD-10-CM

## 2013-02-16 LAB — BASIC METABOLIC PANEL
BUN: 12 mg/dL (ref 6–23)
CALCIUM: 9.4 mg/dL (ref 8.4–10.5)
CO2: 33 mEq/L — ABNORMAL HIGH (ref 19–32)
Chloride: 98 mEq/L (ref 96–112)
Creatinine, Ser: 0.7 mg/dL (ref 0.4–1.2)
GFR: 108.46 mL/min (ref >60.00)
GLUCOSE: 119 mg/dL — AB (ref 70–99)
Potassium: 3.2 mEq/L — ABNORMAL LOW (ref 3.5–5.1)
Sodium: 140 mEq/L (ref 135–145)

## 2013-02-16 LAB — CBC WITH DIFFERENTIAL/PLATELET
BASOS PCT: 0.3 % (ref 0.0–3.0)
Basophils Absolute: 0 10*3/uL (ref 0.0–0.1)
EOS PCT: 2.5 % (ref 0.0–5.0)
Eosinophils Absolute: 0.3 10*3/uL (ref 0.0–0.7)
HEMATOCRIT: 41.3 % (ref 36.0–46.0)
HEMOGLOBIN: 13.8 g/dL (ref 12.0–15.0)
Lymphocytes Relative: 28.1 % (ref 12.0–46.0)
Lymphs Abs: 3.7 10*3/uL (ref 0.7–4.0)
MCHC: 33.4 g/dL (ref 30.0–36.0)
MCV: 91.4 fl (ref 78.0–100.0)
Monocytes Absolute: 1 10*3/uL (ref 0.1–1.0)
Monocytes Relative: 7.6 % (ref 3.0–12.0)
Neutro Abs: 8 10*3/uL — ABNORMAL HIGH (ref 1.4–7.7)
Neutrophils Relative %: 61.5 % (ref 43.0–77.0)
Platelets: 293 10*3/uL (ref 150.0–400.0)
RBC: 4.52 Mil/uL (ref 3.87–5.11)
RDW: 14.2 % (ref 11.5–14.6)
WBC: 13 10*3/uL — ABNORMAL HIGH (ref 4.5–10.5)

## 2013-02-16 MED ORDER — AMOXICILLIN-POT CLAVULANATE 875-125 MG PO TABS: 1.0000 | ORAL_TABLET | Freq: Two times a day (BID) | ORAL | Status: AC

## 2013-02-16 MED ORDER — DICYCLOMINE HCL 10 MG PO CAPS: ORAL_CAPSULE | ORAL | Status: DC

## 2013-02-16 NOTE — Patient Instructions (Signed)
Please go to the basement level to have your labs drawn.  We sent prescriptions to [facility].  1. Augmentin 2. Bentyl ( Dicyclomine)   Soft bland diet.  We made you a follow up appointment with Nicoletta Ba PA-C on 03-01-2013 at 1:30 PM.

## 2013-02-16 NOTE — Progress Notes (Signed)
Subjective:    Patient ID: [name], female    DOB: 07/13/1960, 53 y.o.   MRN: [id number]  HPI  [name] is a pleasant 53 year old African American female known to Dr. Scarlette Shorts. She had remote colonoscopy in 2005 which was normal. She does have history of recurrent sinusitis, hypertension, a mild cardiomyopathy with EF of 65-70%. She also has history of coronary artery disease and previous MI, hypertension, and morbid obesity as well as sleep apnea, and diabetes She had an emergency room visit on 02/11/2013 with abdominal pain and says she has now been hurting for about 9 days. Her pain is worse postprandially and she describes this as aching fairly constant and present in her left lower abdomen. She says if she doesn't eat she feels best. She's not had any documented fever or chills. No diarrhea melena or hematochezia and her bowels have been moving. She denies any nausea or vomiting no dysuria urgency or frequency.  Workup in the emergency room on 02/11/2013 with CT scan of the abdomen and pelvis with contrast showed scattered diverticulosis and hepatomegaly but no acute findings to explain her pain. She also a small umbilical hernia containing fat. Labs showed a WBC elevated at 13.5 hemoglobin 13.9 hematocrit of 41 potassium was 3.1 liver function studies normal.    Review of Systems  Constitutional: Negative.   HENT: Negative.   Eyes: Negative.   Respiratory: Negative.   Cardiovascular: Negative.   Endocrine: Negative.   Genitourinary: Negative.   Musculoskeletal: Negative.   Allergic/Immunologic: Negative.   Psychiatric/Behavioral: Negative.    Outpatient Prescriptions Prior to Visit  Medication Sig Dispense Refill  . acetaminophen (TYLENOL) 325 MG tablet Take 650 mg by mouth every 6 (six) hours as needed.      [name] albuterol (PROVENTIL HFA;VENTOLIN HFA) 108 (90 BASE) MCG/ACT inhaler Inhale 2 puffs into the lungs every 4 (four) hours as needed for wheezing.      . cloNIDine  (CATAPRES) 0.2 MG tablet Take 1 tablet (0.2 mg total) by mouth 2 (two) times daily.  30 tablet  1  . fluticasone (FLONASE) 50 MCG/ACT nasal spray Place 2 sprays into the nose daily. For allergies      . furosemide (LASIX) 40 MG tablet Take 2 tablets (80 mg total) by mouth daily as needed. For swelling or fluid retention.  30 tablet  6  . HYDROcodone-acetaminophen (NORCO/VICODIN) 5-325 MG per tablet Take 1 tablet by mouth every 6 (six) hours as needed for moderate pain (pain).      [name] losartan-hydrochlorothiazide (HYZAAR) 100-25 MG per tablet Take 1 tablet by mouth daily.  15 tablet  1  . Multiple Vitamins-Minerals (MULTIVITAMIN PO) Take 1 tablet by mouth daily.      [name] oxyCODONE-acetaminophen (PERCOCET/ROXICET) 5-325 MG per tablet Take 1 tablet by mouth every 6 (six) hours as needed for severe pain.  15 tablet  0  . potassium chloride (K-DUR) 10 MEQ tablet Take 1 tablet (10 mEq total) by mouth 2 (two) times daily.  10 tablet  0  . traMADol (ULTRAM) 50 MG tablet Take 50 mg by mouth every 6 (six) hours as needed (pain).      . promethazine (PHENERGAN) 25 MG tablet Take 1 tablet (25 mg total) by mouth every 8 (eight) hours as needed for nausea or vomiting.  15 tablet  0   No facility-administered medications prior to visit.   Allergies  Allergen Reactions  . Doxycycline Nausea And Vomiting  . Latex Hives  . Ciprofloxacin  Rash  . Fish Allergy Rash    Only reaction to Mackerel.  No other issues with any type of fish or shellfish currently   . Hydrocortisone Rash  . Neomycin Rash  . Sulfonamide Derivatives Swelling and Rash   Patient Active Problem List   Diagnosis Date Noted  . Cough 11/05/2012  . Acute sinusitis 03/10/2012  . Sinusitis, chronic 03/10/2012  . Abnormal EKG 12/03/2010  . ACUTE BRONCHITIS 02/26/2010  . PNEUMONIA, ORGAN UNSPECIFIED 03/01/2009  . LEG PAIN, BILATERAL 04/11/2008  . HYPOKALEMIA 08/05/2007  . INSECT BITE, UPPER ARM 08/05/2007  . CHF 07/20/2007  . NUMBNESS  07/20/2007  . UNS ADVRS EFF UNS RX MEDICINAL&BIOLOGICAL SBSTNC 02/07/2007  . Acute Suppurative Otitis Media without Spontaneous Rupture of Eardrum 02/04/2007  . EXTERNAL OTITIS 01/29/2007  . OTITIS MEDIA, SEROUS, ACUTE 12/13/2006  . SINUSITIS- ACUTE-NOS 12/13/2006  . GOUT 10/31/2006  . COMMON MIGRAINE 10/31/2006  . PLANTAR FASCIITIS 10/31/2006  . DIABETES MELLITUS, TYPE II 10/02/2006  . HYPERLIPIDEMIA 10/02/2006  . Morbid obesity 10/02/2006  . OBSTRUCTIVE SLEEP APNEA 06/09/2006  . CARPAL TUNNEL SYNDROME 06/09/2006  . HYPERTENSION 06/09/2006  . MYOCARDIAL INFARCTION, HX OF 06/09/2006  . CORONARY ARTERY DISEASE 06/09/2006  . ALLERGIC RHINITIS 06/09/2006   History  Substance Use Topics  . Smoking status: Never Smoker   . Smokeless tobacco: Never Used  . Alcohol Use: No      family history includes Asthma in her sister; Breast cancer in an other family member; Coronary artery disease in an other family member; Heart disease in her mother.  Objective:   Physical Exam well-developed obese African American female in no acute distress, pleasant blood pressure 122/82 pulse 88 height 5 foot 3 weight 302, BMI 53.6. HEENT; nontraumatic normocephalic EOMI PERRLA sclera anicteric, Supple; no JVD, Cardiovascular ;regular rate and rhythm with S1-S2 no murmur or gallop, Pulmonary; clear bilaterally, Abdomen ;morbidly obese soft midline lower incisional scar she is tender in the left lower quadrant left mid quadrant no guarding or rebound no palpable mass or hepatosplenomegaly bowel sounds are present, Rectal; exam not done, Extremities; no clubbing cyanosis or edema skin warm and dry, Psych; mood and affect appropriate        Assessment & Plan:  #81  53 year old female with multiple medical problems with complaints of left lower abdominal pain x9 days. Her exam is most consistent with diverticulitis. She did not have diverticulitis on CT scan a week ago however she did have leukocytosis and her  pain persists and we'll treat for probable diverticulitis. #2 morbid obesity #3 diabetes mellitus #4 sleep apnea #5 coronary artery disease status post MI #6 mild cardiomyopathy #8 hypertension  Plan; start Augmentin 875 mg by mouth twice daily with food x10 days Start bentyl 10 mg 2-3 times daily as needed for cramping and abdominal pain Repeat CBC with differential Repeat Bmet and replace potassium as needed Plan office followup in 10-14 days

## 2013-02-16 NOTE — Progress Notes (Signed)
Agree with initial assessment and plans as outlined 

## 2013-02-26 ENCOUNTER — Encounter: Payer: Self-pay | Admitting: Cardiology

## 2013-02-26 ENCOUNTER — Ambulatory Visit (INDEPENDENT_AMBULATORY_CARE_PROVIDER_SITE_OTHER): Payer: No Typology Code available for payment source | Admitting: Cardiology

## 2013-02-26 VITALS — BP 150/100 | HR 76 | Ht 63.0 in | Wt 307.0 lb

## 2013-02-26 DIAGNOSIS — I10 Essential (primary) hypertension: Secondary | ICD-10-CM

## 2013-02-26 MED ORDER — AMLODIPINE BESYLATE 5 MG PO TABS: 5.0000 mg | ORAL_TABLET | Freq: Every day | ORAL | Status: DC

## 2013-02-26 NOTE — Progress Notes (Signed)
HPI  Pt presents to f/u on her ER visits. Was seen on the 20th for dyspnea, and had CT chest which ruled out PE. CXR showed possible vascular congestion. No changes were made to her med list at that visit. Pt reports that since going to the ER she has been feeling better but is still quite fatigued. She has not had any chest pain since that visit. Has been taking lasix 80mg  daily. Dyspnea and lower extremity edema have also improved. Is urinating well. She does not check her BP at home.  Allergies  Allergen Reactions  . Doxycycline Nausea And Vomiting  . Latex Hives  . Ciprofloxacin Rash  . Fish Allergy Rash    Only reaction to Mackerel.  No other issues with any type of fish or shellfish currently   . Hydrocortisone Rash  . Neomycin Rash  . Sulfonamide Derivatives Swelling and Rash    Current Outpatient Prescriptions  Medication Sig Dispense Refill  . acetaminophen (TYLENOL) 325 MG tablet Take 650 mg by mouth every 6 (six) hours as needed.      [name] albuterol (PROVENTIL HFA;VENTOLIN HFA) 108 (90 BASE) MCG/ACT inhaler Inhale 2 puffs into the lungs every 4 (four) hours as needed for wheezing.      [name] amoxicillin-clavulanate (AUGMENTIN) 875-125 MG per tablet Take 1 tablet by mouth 2 (two) times daily.  20 tablet  0  . cloNIDine (CATAPRES) 0.2 MG tablet Take 1 tablet (0.2 mg total) by mouth 2 (two) times daily.  30 tablet  1  . dicyclomine (BENTYL) 10 MG capsule Take 1 tab 2-3 times daily  90 capsule  1  . Docusate Calcium (STOOL SOFTENER PO) Take by mouth.      . fluticasone (FLONASE) 50 MCG/ACT nasal spray Place 2 sprays into the nose daily. For allergies      . furosemide (LASIX) 40 MG tablet Take 2 tablets (80 mg total) by mouth daily as needed. For swelling or fluid retention.  30 tablet  6  . HYDROcodone-acetaminophen (NORCO/VICODIN) 5-325 MG per tablet Take 1 tablet by mouth every 6 (six) hours as needed for moderate pain (pain).      [name] losartan-hydrochlorothiazide (HYZAAR) 100-25 MG  per tablet Take 1 tablet by mouth daily.  15 tablet  1  . Multiple Vitamins-Minerals (MULTIVITAMIN PO) Take 1 tablet by mouth daily.      . pantoprazole (PROTONIX) 40 MG tablet Take 40 mg by mouth daily.      . potassium chloride (K-DUR,KLOR-CON) 10 MEQ tablet Take 10 mEq by mouth 2 (two) times daily.      . traMADol (ULTRAM) 50 MG tablet Take 50 mg by mouth every 6 (six) hours as needed (pain).       No current facility-administered medications for this visit.    Past Medical History  Diagnosis Date  . Hypokalemia   . CHF (congestive heart failure)   . Unspecified adverse effect of unspecified drug, medicinal and biological substance   . Sinusitis   . Plantar fasciitis   . Common migraine   . Morbid obesity   . Hyperlipidemia   . OSA (obstructive sleep apnea)   . Carpal tunnel syndrome   . Hypertension   . CAD (coronary artery disease)     Trivial by cath 2001  . Allergic rhinitis   . CPAP (continuous positive airway pressure) dependence     Past Surgical History  Procedure Laterality Date  . Tubal ligation    . Tonsillectomy    .  Uvulopalatopharyngoplasty    . Cesarean section    . Nasal sinus surgery    . Partial hysterectomy      ROS:  As stated in the HPI and negative for all other systems.  PHYSICAL EXAM BP 150/100  Pulse 76  Ht 5\' 3"  (1.6 m)  Wt 307 lb (139.254 kg)  BMI 54.40 kg/m2 GENERAL:  Well appearing HEENT:  NCAT NECK:  No jugular venous distention, waveform within normal limits, carotid upstroke brisk and symmetric, no bruits, no thyromegaly LYMPHATICS:  No cervical, inguinal adenopathy LUNGS:  Clear to auscultation bilaterally BACK:  No CVA tenderness CHEST:  Unremarkable HEART:  PMI not displaced or sustained,S1 and S2 within normal limits, no S3, no S4, no clicks, no rubs, no murmurs ABD:  Obese, positive bowel sounds normal in frequency in pitch, no bruits, no rebound, no guarding, no midline pulsatile mass, no hepatomegaly, no  splenomegaly EXT:  2 plus pulses throughout, no edema, no cyanosis no clubbing SKIN:  No rashes no nodules NEURO:  Cranial nerves II through XII grossly intact, motor grossly intact throughout PSYCH:  Cognitively intact, oriented to person place and time   EKG:  Sinus rhythm, rate 96, right axis deviation, probable old anteroseptal microinfarction, intervals within normal limits, no acute ST-T wave changes. 02/14/13.  ASSESSMENT AND PLAN  CHEST PAIN: no further episodes of CP since ED visit. Had trivial CAD by cath in 2001. Doubt significant ischemic disease. Continue to monitor.  DYSPNEA: Improved since ED visit, on lasix 80mg  daily. Mild edema today but no signs of decompensated CHF. Discussed in detail the need to avoid salt.  HTN: BP poorly controlled. Will add amlodipine 5mg  daily and have pt f/u in 1 month to recheck BP. Advised pt get home BP cuff to check her BP outside of office visits.  HYPOKALEMIA: recheck BMET today, supplement K as needed.

## 2013-02-26 NOTE — Patient Instructions (Addendum)
Will obtain labs today and call you with the results (BMET)  ADD NORVASC 5 MG DAILY, RX SENT TO PHARMACY  Your physician recommends that you schedule a follow-up appointment in: [place]  Monitor your blood pressure at home

## 2013-03-01 ENCOUNTER — Ambulatory Visit (INDEPENDENT_AMBULATORY_CARE_PROVIDER_SITE_OTHER): Payer: No Typology Code available for payment source | Admitting: Physician Assistant

## 2013-03-01 ENCOUNTER — Telehealth: Payer: Self-pay | Admitting: *Deleted

## 2013-03-01 ENCOUNTER — Encounter: Payer: Self-pay | Admitting: Physician Assistant

## 2013-03-01 DIAGNOSIS — K5732 Diverticulitis of large intestine without perforation or abscess without bleeding: Secondary | ICD-10-CM

## 2013-03-01 DIAGNOSIS — E876 Hypokalemia: Secondary | ICD-10-CM

## 2013-03-01 LAB — BASIC METABOLIC PANEL
BUN: 11 mg/dL (ref 6–23)
CHLORIDE: 106 meq/L (ref 96–112)
CO2: 28 mEq/L (ref 19–32)
Calcium: 9.3 mg/dL (ref 8.4–10.5)
Creatinine, Ser: 0.7 mg/dL (ref 0.4–1.2)
GFR: 119.9 mL/min (ref >60.00)
Glucose, Bld: 86 mg/dL (ref 70–99)
POTASSIUM: 3 meq/L — AB (ref 3.5–5.1)
SODIUM: 141 meq/L (ref 135–145)

## 2013-03-01 MED ORDER — FLUCONAZOLE 150 MG PO TABS: ORAL_TABLET | ORAL | Status: DC

## 2013-03-01 MED ORDER — AMOXICILLIN-POT CLAVULANATE 875-125 MG PO TABS: 1.0000 | ORAL_TABLET | Freq: Two times a day (BID) | ORAL | Status: AC

## 2013-03-01 NOTE — Telephone Encounter (Signed)
K+ is 3.0 - per Dr Percival Spanish pt to take potassium 80 MEQ today and then take 20 MEQ BID thereafter.  She should repeat lab on Friday.  Left message for pt to call office back to discuss lab results and new medication orders.

## 2013-03-01 NOTE — Telephone Encounter (Signed)
I called the [facility] for a referral for the patient. I left a message for [name] Himmelrich at [phone].

## 2013-03-01 NOTE — Patient Instructions (Addendum)
We sent prescriptions to [facility] [place] 1. augmentin 2. Diflucan for yeast infections  We will call you with a referral to  Nutritionist.

## 2013-03-01 NOTE — Progress Notes (Signed)
Subjective:    Patient ID: [name], female    DOB: 1960/05/04, 53 y.o.   MRN: [id number]  HPI Meredith comes back in today for followup of diverticulitis. She used was seen in the office on January 27 and started on a 10 day course of Augmentin for diverticulitis. She says that she definitely feels better but that the pain is not completely resolved. She has no complaints of fever or chills, no diarrhea melena or hematochezia. She did develop a vaginal candidiasis and treated this with over-the-counter Monistat. Her last colonoscopy was done in 2005 per Dr. Henrene Pastor and this was a normal exam.    Review of Systems  Constitutional: Negative.   HENT: Negative.   Eyes: Negative.   Respiratory: Negative.   Cardiovascular: Negative.   Gastrointestinal: Positive for abdominal pain.  Endocrine: Negative.   Genitourinary: Negative.   Musculoskeletal: Negative.   Skin: Negative.   Allergic/Immunologic: Negative.   Neurological: Negative.   Hematological: Negative.   Psychiatric/Behavioral: Negative.    Outpatient Prescriptions Prior to Visit  Medication Sig Dispense Refill  . albuterol (PROVENTIL HFA;VENTOLIN HFA) 108 (90 BASE) MCG/ACT inhaler Inhale 2 puffs into the lungs every 4 (four) hours as needed for wheezing.      [name] amLODipine (NORVASC) 5 MG tablet Take 1 tablet (5 mg total) by mouth daily.  30 tablet  1  . cloNIDine (CATAPRES) 0.2 MG tablet Take 1 tablet (0.2 mg total) by mouth 2 (two) times daily.  30 tablet  1  . dicyclomine (BENTYL) 10 MG capsule Take 1 tab 2-3 times daily  90 capsule  1  . fluticasone (FLONASE) 50 MCG/ACT nasal spray Place 2 sprays into the nose daily. For allergies      . furosemide (LASIX) 40 MG tablet Take 2 tablets (80 mg total) by mouth daily as needed. For swelling or fluid retention.  30 tablet  6  . losartan-hydrochlorothiazide (HYZAAR) 100-25 MG per tablet Take 1 tablet by mouth daily.  15 tablet  1  . pantoprazole (PROTONIX) 40 MG tablet Take 40 mg  by mouth daily.      [name] acetaminophen (TYLENOL) 325 MG tablet Take 650 mg by mouth every 6 (six) hours as needed.      Mariane Baumgarten Calcium (STOOL SOFTENER PO) Take by mouth.      [name] HYDROcodone-acetaminophen (NORCO/VICODIN) 5-325 MG per tablet Take 1 tablet by mouth every 6 (six) hours as needed for moderate pain (pain).      . Multiple Vitamins-Minerals (MULTIVITAMIN PO) Take 1 tablet by mouth daily.      . potassium chloride (K-DUR,KLOR-CON) 10 MEQ tablet Take 10 mEq by mouth 2 (two) times daily.      . traMADol (ULTRAM) 50 MG tablet Take 50 mg by mouth every 6 (six) hours as needed (pain).       No facility-administered medications prior to visit.   Allergies  Allergen Reactions  . Doxycycline Nausea And Vomiting  . Latex Hives  . Ciprofloxacin Rash  . Fish Allergy Rash    Only reaction to Mackerel.  No other issues with any type of fish or shellfish currently   . Hydrocortisone Rash  . Neomycin Rash  . Sulfonamide Derivatives Swelling and Rash   Patient Active Problem List   Diagnosis Date Noted  . Diverticulitis of colon without hemorrhage 03/01/2013  . Cough 11/05/2012  . Acute sinusitis 03/10/2012  . Sinusitis, chronic 03/10/2012  . Abnormal EKG 12/03/2010  . ACUTE BRONCHITIS 02/26/2010  .  PNEUMONIA, ORGAN UNSPECIFIED 03/01/2009  . LEG PAIN, BILATERAL 04/11/2008  . HYPOKALEMIA 08/05/2007  . CHF 07/20/2007  . NUMBNESS 07/20/2007  . Acute Suppurative Otitis Media without Spontaneous Rupture of Eardrum 02/04/2007  . EXTERNAL OTITIS 01/29/2007  . OTITIS MEDIA, SEROUS, ACUTE 12/13/2006  . SINUSITIS- ACUTE-NOS 12/13/2006  . GOUT 10/31/2006  . COMMON MIGRAINE 10/31/2006  . PLANTAR FASCIITIS 10/31/2006  . DIABETES MELLITUS, TYPE II 10/02/2006  . HYPERLIPIDEMIA 10/02/2006  . Morbid obesity 10/02/2006  . OBSTRUCTIVE SLEEP APNEA 06/09/2006  . CARPAL TUNNEL SYNDROME 06/09/2006  . HYPERTENSION 06/09/2006  . MYOCARDIAL INFARCTION, HX OF 06/09/2006  . CORONARY ARTERY DISEASE  06/09/2006  . ALLERGIC RHINITIS 06/09/2006   History  Substance Use Topics  . Smoking status: Never Smoker   . Smokeless tobacco: Never Used  . Alcohol Use: No   family history includes Asthma in her sister; Breast cancer in an other family member; Coronary artery disease in an other family member; Heart disease in her mother.    Objective:   Physical Exam  well-developed we'll be 5 female in no acute distress, quite pleasant blood pressure 130/82 pulse 66 height 5 foot 3 weight 313. HEENT nontraumatic normocephalic EOMI PERRLA sclera anicteric, Supple no JVD, Cardiovascular regular rate and rhythm with S1-S2 no murmur or gallop, Pulmonary clear bilaterally, Abdomen obese ,soft, she has minimal tenderness in the left lower quadrant there is no guarding or rebound no palpable mass or hepatosplenomegaly bowel sounds are active, Rectal exam not done, Extremities no clubbing cyanosis or edema skin warm and dry, Psych mood and affect appropriate        Assessment & Plan:   #52  53 year old female with acute diverticulitis mild refractory symptoms after 10 days of Augmentin. #2 morbid obesity-BMI 53.6 #3 diabetes mellitus #4 sleep apnea #5 coronary artery disease status post MI #6 mild cardiomyopathy #7 hypertension  Plan; will treat with another 7 days of Augmentin 875 mg by mouth twice daily Have also called and Diflucan 150 mg x1 and repeat in 3 days if needed for candidiasis She will continue prune juice or MiraLax on a daily basis for constipation She will be due for followup colonoscopy at some point in 2015. She will call back to schedule. This will need to be done at the hospital due to BMI

## 2013-03-01 NOTE — Progress Notes (Signed)
Agree with initial assessment and plans 

## 2013-03-02 NOTE — Telephone Encounter (Signed)
Pt to take 120 MEQ for 3 days then decrease to 100 MEQ daily.  Repeat lab work on Monday.

## 2013-03-02 NOTE — Telephone Encounter (Signed)
Called to inform pt of how to take K+ - she now states she is and has been taking 20 MEQ tablets 5 in the am and 4 in the pm. (100 MEQ/80MEQ)  Fredia Beets, RN aware and will discuss with Dr Percival Spanish.

## 2013-03-02 NOTE — Telephone Encounter (Signed)
Per Dr Percival Spanish - pt to take 120 MEQ BID for 2 days then 100 MEQ BID - repeat lab in 1 week.  Pt aware - lab order placed - she will call when she needs a refill.

## 2013-03-02 NOTE — Telephone Encounter (Signed)
Melissa fron the [facility]. She saw the referral I put in and she will call the patient.

## 2013-03-05 ENCOUNTER — Ambulatory Visit: Payer: No Typology Code available for payment source | Admitting: Internal Medicine

## 2013-03-09 ENCOUNTER — Other Ambulatory Visit: Payer: No Typology Code available for payment source

## 2013-03-15 ENCOUNTER — Other Ambulatory Visit (INDEPENDENT_AMBULATORY_CARE_PROVIDER_SITE_OTHER): Payer: No Typology Code available for payment source

## 2013-03-15 ENCOUNTER — Telehealth: Payer: Self-pay | Admitting: Cardiology

## 2013-03-15 DIAGNOSIS — E876 Hypokalemia: Secondary | ICD-10-CM

## 2013-03-15 LAB — BASIC METABOLIC PANEL
BUN: 14 mg/dL (ref 6–23)
CO2: 26 meq/L (ref 19–32)
Calcium: 9.3 mg/dL (ref 8.4–10.5)
Chloride: 105 mEq/L (ref 96–112)
Creatinine, Ser: 0.6 mg/dL (ref 0.4–1.2)
GFR: 147.96 mL/min (ref >60.00)
Glucose, Bld: 116 mg/dL — ABNORMAL HIGH (ref 70–99)
POTASSIUM: 3.7 meq/L (ref 3.5–5.1)
SODIUM: 138 meq/L (ref 135–145)

## 2013-03-15 NOTE — Telephone Encounter (Signed)
New message   Medication recently change . C/O difficulty walking & standing.

## 2013-03-15 NOTE — Telephone Encounter (Signed)
Per pt call - legs are "really hurting, really, really bad".  Reports skin on right foot "really tight"  And feels like she has weights on her ankles when she walks.  Unable to be on feet more than 5 to 10 mins at a time d/t pain.  She has not been taking her Furosemide because the pharmacist told her not to since her K+ was low.  She has not had repeat lab work as ordered but will go today.  Aware we will call back with orders once we receive lab results.  Aware I will forward information to Dr Percival Spanish for his review of her leg pain.

## 2013-03-17 ENCOUNTER — Telehealth: Payer: Self-pay | Admitting: Physician Assistant

## 2013-03-17 NOTE — Telephone Encounter (Signed)
Pt aware of results and will restart Furosemide as directed.

## 2013-03-17 NOTE — Telephone Encounter (Signed)
I spoke to the patient Monday, 03/15/13, evening at 9pm.  She was calling for potassium lab results.  It was WNL.  She reports that she was taking 124meq twice daily but stopped taking her lasix(80mg  PRN) about two weeks ago.  She reports her legs were swelling up.  She does not check her weight on a daily basis.  i instructed her to check her weight daily.  If it increases by 2 pounds in a day or 5 pounds in a week to take the lasix until weight returns to baseline.  She will take 61meq of K+ daily and increase to BID if she takes lasix.  [name] 8:14 AM

## 2013-03-26 ENCOUNTER — Ambulatory Visit (INDEPENDENT_AMBULATORY_CARE_PROVIDER_SITE_OTHER): Payer: No Typology Code available for payment source | Admitting: Physician Assistant

## 2013-03-26 ENCOUNTER — Encounter: Payer: Self-pay | Admitting: Physician Assistant

## 2013-03-26 ENCOUNTER — Telehealth: Payer: Self-pay | Admitting: *Deleted

## 2013-03-26 ENCOUNTER — Other Ambulatory Visit: Payer: No Typology Code available for payment source

## 2013-03-26 VITALS — BP 92/60 | HR 68 | Ht 63.0 in | Wt 311.1 lb

## 2013-03-26 DIAGNOSIS — I10 Essential (primary) hypertension: Secondary | ICD-10-CM

## 2013-03-26 DIAGNOSIS — I5032 Chronic diastolic (congestive) heart failure: Secondary | ICD-10-CM

## 2013-03-26 DIAGNOSIS — E876 Hypokalemia: Secondary | ICD-10-CM

## 2013-03-26 LAB — BASIC METABOLIC PANEL
BUN: 16 mg/dL (ref 6–23)
CHLORIDE: 104 meq/L (ref 96–112)
CO2: 28 meq/L (ref 19–32)
Calcium: 9.1 mg/dL (ref 8.4–10.5)
Creatinine, Ser: 0.6 mg/dL (ref 0.4–1.2)
GFR: 136.43 mL/min (ref >60.00)
Glucose, Bld: 113 mg/dL — ABNORMAL HIGH (ref 70–99)
Potassium: 3.3 mEq/L — ABNORMAL LOW (ref 3.5–5.1)
Sodium: 140 mEq/L (ref 135–145)

## 2013-03-26 MED ORDER — FUROSEMIDE 40 MG PO TABS: 80.0000 mg | ORAL_TABLET | Freq: Every day | ORAL | Status: DC

## 2013-03-26 MED ORDER — LOSARTAN POTASSIUM-HCTZ 50-12.5 MG PO TABS: 1.0000 | ORAL_TABLET | Freq: Every day | ORAL | Status: DC

## 2013-03-26 MED ORDER — POTASSIUM CHLORIDE CRYS ER 20 MEQ PO TBCR: 60.0000 meq | EXTENDED_RELEASE_TABLET | Freq: Two times a day (BID) | ORAL | Status: DC

## 2013-03-26 NOTE — Progress Notes (Signed)
[place], [place], [place]  [id number] Phone: [phone] Fax:  [phone]  Date:  03/26/2013   ID:  [name], DOB 1960/09/22, MRN [id number]  PCP:  No PCP Per Patient  Cardiologist:  Dr. Minus Breeding     History of Present Illness: [name] is a 53 y.o. female with a history of diastolic CHF, HTN, HL, sleep apnea. Cardiac catheterization in 2001 demonstrated no significant CAD. She was seen Dr. Percival Spanish 02/26/13 in follow up after a recent trip to emergency room for chest pain. There was possible vascular congestion on chest x-ray. Chest CT was negative for pulmonary embolism. Patient's blood pressure was markedly elevated. Amlodipine was added to her medical regimen.  She is doing well. She feels much better when she takes Lasix on a daily basis.  She denies LE edema.  No chest pain, dyspnea, syncope.  No orthopnea, PND.    Recent Labs: 02/09/2013: Pro B Natriuretic peptide (BNP) 193.2*  02/11/2013: ALT 22  02/16/2013: Hemoglobin 13.8  03/15/2013: Creatinine 0.6; Potassium 3.7   Wt Readings from Last 3 Encounters:  03/26/13 311 lb 1.9 oz (141.123 kg)  03/01/13 313 lb 9.6 oz (142.248 kg)  02/26/13 307 lb (139.254 kg)     Past Medical History  Diagnosis Date  . Common migraine   . Morbid obesity   . Hyperlipidemia   . OSA (obstructive sleep apnea)     CPAP dependent  . Carpal tunnel syndrome   . Hypertension   . Allergic rhinitis   . CAD (coronary artery disease)     LHC (11/29/1999):  EF 60%; no significant CAD.  [name] Chronic diastolic CHF (congestive heart failure)     a. Echo (01/21/11):  Vigorous LVF, EF 65-70%, no RWMA, Gr 2 DD.     Current Outpatient Prescriptions  Medication Sig Dispense Refill  . albuterol (PROVENTIL HFA;VENTOLIN HFA) 108 (90 BASE) MCG/ACT inhaler Inhale 2 puffs into the lungs every 4 (four) hours as needed for wheezing.      [name] amLODipine (NORVASC) 5 MG tablet Take 1 tablet (5 mg total) by mouth daily.  30 tablet  1  .  cloNIDine (CATAPRES) 0.2 MG tablet Take 1 tablet (0.2 mg total) by mouth 2 (two) times daily.  30 tablet  1  . dicyclomine (BENTYL) 10 MG capsule Take 1 tab 2-3 times daily  90 capsule  1  . fluconazole (DIFLUCAN) 150 MG tablet Take 1 tab as needed for yeast infection.  May repeat in 3 days.  2 tablet  0  . fluticasone (FLONASE) 50 MCG/ACT nasal spray Place 2 sprays into the nose daily. For allergies      . furosemide (LASIX) 40 MG tablet Take 2 tablets (80 mg total) by mouth daily as needed. For swelling or fluid retention.  30 tablet  6  . losartan-hydrochlorothiazide (HYZAAR) 100-25 MG per tablet Take 1 tablet by mouth daily.  15 tablet  1  . pantoprazole (PROTONIX) 40 MG tablet Take 40 mg by mouth daily.      . potassium chloride SA (K-DUR,KLOR-CON) 20 MEQ tablet Take 100 mEq by mouth 2 (two) times daily.       No current facility-administered medications for this visit.    Allergies:   Doxycycline; Latex; Ciprofloxacin; Fish allergy; Hydrocortisone; Neomycin; and Sulfonamide derivatives   Social History:  The patient  reports that she has never smoked. She has never used smokeless tobacco. She reports that she does not drink alcohol or  use illicit drugs.   Family History:  The patient's family history includes Asthma in her sister; Breast cancer in an other family member; Coronary artery disease in an other family member; Heart disease in her mother.   ROS:  Please see the history of present illness.      All other systems reviewed and negative.   PHYSICAL EXAM: VS:  BP 92/60  Pulse 68  Ht 5\' 3"  (1.6 m)  Wt 311 lb 1.9 oz (141.123 kg)  BMI 55.13 kg/m2 Well nourished, well developed, in no acute distress HEENT: normal Neck: I cannot assess JVD Cardiac:  normal S1, S2; RRR; no murmur Lungs:  clear to auscultation bilaterally, no wheezing, rhonchi or rales Abd: soft, nontender, no hepatomegaly Ext: no edema Skin: warm and dry Neuro:  CNs 2-12 intact, no focal abnormalities note       ASSESSMENT AND PLAN:  1. Hypertension:  BP is now low.  She does feel somewhat sluggish.  I will decrease Losartan/HCTZ to 50/12.5 mg QD.  Check BMET in 1 week with BP check. 2. Chronic Diastolic CHF:  Volume stable.  Continue current Rx.  3. Hypokalemia:  Check BMET today and repeat in 1 week. 4. Disposition:  F/u 3 mos with me.  Signed, Richardson Dopp, PA-C  03/26/2013 8:48 AM

## 2013-03-26 NOTE — Telephone Encounter (Signed)
pt notified about lab results and to increase K+ to 60 meq BID, bmet 3/13 when she comes in for BP check. Pt verbalized understanding to Plan of Care, said she does not need K+ refilled at this time.

## 2013-03-26 NOTE — Patient Instructions (Addendum)
DECREASE LOSARTAN/HCTZ TO 50/12.5 MG DAILY; A NEW RX WAS SENT IN TODAY  LAB WORK TODAY; BMET  REPEAT LAB WORK BMET WITH A NURSE VISIT FOR A  BLOOD PRESSURE CHECK TO BE DONE IN 1 WEEK  Your physician recommends that you schedule a follow-up appointment in: [place], [facility]

## 2013-03-31 ENCOUNTER — Emergency Department (HOSPITAL_COMMUNITY): Payer: No Typology Code available for payment source

## 2013-03-31 ENCOUNTER — Emergency Department (HOSPITAL_COMMUNITY)
Admission: EM | Admit: 2013-03-31 | Discharge: 2013-03-31 | Disposition: A | Discharge status: Home / Self Care | Payer: No Typology Code available for payment source | Attending: Emergency Medicine | Admitting: Emergency Medicine

## 2013-03-31 ENCOUNTER — Encounter (HOSPITAL_COMMUNITY): Payer: Self-pay | Admitting: Emergency Medicine

## 2013-03-31 DIAGNOSIS — Z9981 Dependence on supplemental oxygen: Secondary | ICD-10-CM | POA: Insufficient documentation

## 2013-03-31 DIAGNOSIS — M1710 Unilateral primary osteoarthritis, unspecified knee: Secondary | ICD-10-CM | POA: Insufficient documentation

## 2013-03-31 DIAGNOSIS — Z91040 Latex allergy status: Secondary | ICD-10-CM | POA: Insufficient documentation

## 2013-03-31 DIAGNOSIS — IMO0002 Reserved for concepts with insufficient information to code with codable children: Secondary | ICD-10-CM | POA: Insufficient documentation

## 2013-03-31 DIAGNOSIS — Z79899 Other long term (current) drug therapy: Secondary | ICD-10-CM | POA: Insufficient documentation

## 2013-03-31 DIAGNOSIS — Z8709 Personal history of other diseases of the respiratory system: Secondary | ICD-10-CM | POA: Insufficient documentation

## 2013-03-31 DIAGNOSIS — I10 Essential (primary) hypertension: Secondary | ICD-10-CM | POA: Insufficient documentation

## 2013-03-31 DIAGNOSIS — G4733 Obstructive sleep apnea (adult) (pediatric): Secondary | ICD-10-CM | POA: Insufficient documentation

## 2013-03-31 DIAGNOSIS — I5032 Chronic diastolic (congestive) heart failure: Secondary | ICD-10-CM | POA: Insufficient documentation

## 2013-03-31 DIAGNOSIS — I2510 Atherosclerotic heart disease of native coronary artery without angina pectoris: Secondary | ICD-10-CM | POA: Insufficient documentation

## 2013-03-31 DIAGNOSIS — M179 Osteoarthritis of knee, unspecified: Secondary | ICD-10-CM

## 2013-03-31 MED ORDER — HYDROCODONE-ACETAMINOPHEN 5-325 MG PO TABS
2.0000 | ORAL_TABLET | Freq: Four times a day (QID) | ORAL | Status: DC | PRN
Start: 2013-03-31 — End: 2013-04-11

## 2013-03-31 NOTE — ED Provider Notes (Signed)
Medical screening examination/treatment/procedure(s) were performed by non-physician practitioner and as supervising physician I was immediately available for consultation/collaboration.   EKG Interpretation None        Blanchie Dessert, MD 03/31/13 2325

## 2013-03-31 NOTE — ED Provider Notes (Signed)
CSN: [id number]     Arrival date & time 03/31/13  2012 History  This chart was scribed for Junius Creamer, NP, working with Blanchie Dessert, MD, by Sydell Axon, ED Scribe. This patient was seen in room WTR8/WTR8 and the patient's care was started at 10:03 PM.   The history is provided by the patient. No language interpreter was used.   HPI Comments: [name] is a 53 y.o. Female, with a history of chronic arthritis, who presents to the Emergency Department with a chief complaint of L knee pain with associated swelling with onset 1 month. Patient characterizes her pain as sharp/shooting that is constant and non-changing. She states her pain is made worse with movement and ambulation. Patient states she has had injections for this condition and was recommended a knee replacement by an orthopedist. She states she has taken tramadol, diclofenac and applied an icepack with minimal and temporary relief. She states her last injection occurred on 02/03/2013 as a last resort before surgery.  Past Medical History  Diagnosis Date  . Common migraine   . Morbid obesity   . Hyperlipidemia   . OSA (obstructive sleep apnea)     CPAP dependent  . Carpal tunnel syndrome   . Hypertension   . Allergic rhinitis   . CAD (coronary artery disease)     LHC (11/29/1999):  EF 60%; no significant CAD.  [name] Chronic diastolic CHF (congestive heart failure)     a. Echo (01/21/11):  Vigorous LVF, EF 65-70%, no RWMA, Gr 2 DD.    Past Surgical History  Procedure Laterality Date  . Tubal ligation    . Tonsillectomy    . Uvulopalatopharyngoplasty    . Cesarean section    . Nasal sinus surgery    . Partial hysterectomy     Family History  Problem Relation Age of Onset  . Coronary artery disease      1st degree relatvie <50  . Breast cancer      aunt- ? paternal or maternal  . Asthma Sister   . Heart disease Mother    History  Substance Use Topics  . Smoking status: Never Smoker   . Smokeless tobacco: Never  Used  . Alcohol Use: No   OB History   Grav Para Term Preterm Abortions TAB SAB Ect Mult Living                 Review of Systems  Constitutional: Negative for chills.  Respiratory: Negative for shortness of breath.   Gastrointestinal: Negative for nausea and vomiting.  Musculoskeletal: Positive for arthralgias (L knee pain) and joint swelling.  Skin: Negative for rash and wound.  Neurological: Negative for weakness.  All other systems reviewed and are negative.   Allergies  Doxycycline; Latex; Ciprofloxacin; Fish allergy; Hydrocortisone; Neomycin; and Sulfonamide derivatives  Home Medications   Current Outpatient Rx  Name  Route  Sig  Dispense  Refill  . albuterol (PROVENTIL HFA;VENTOLIN HFA) 108 (90 BASE) MCG/ACT inhaler   Inhalation   Inhale 2 puffs into the lungs every 4 (four) hours as needed for wheezing.         [name] amLODipine (NORVASC) 5 MG tablet   Oral   Take 1 tablet (5 mg total) by mouth daily.   30 tablet   1   . amoxicillin-clavulanate (AUGMENTIN) 875-125 MG per tablet   Oral   Take 1 tablet by mouth 2 (two) times daily. For 7 days         .  cloNIDine (CATAPRES) 0.2 MG tablet   Oral   Take 1 tablet (0.2 mg total) by mouth 2 (two) times daily.   30 tablet   1   . diclofenac (VOLTAREN) 75 MG EC tablet   Oral   Take 75 mg by mouth 2 (two) times daily as needed (inflammation).         [name] dicyclomine (BENTYL) 10 MG capsule      Take 1 tab 2-3 times daily   90 capsule   1   . fluconazole (DIFLUCAN) 150 MG tablet      Take 1 tab as needed for yeast infection.  May repeat in 3 days.   2 tablet   0   . furosemide (LASIX) 40 MG tablet   Oral   Take 2 tablets (80 mg total) by mouth daily. For swelling or fluid retention.   60 tablet   11   . ibuprofen (ADVIL,MOTRIN) 200 MG tablet   Oral   Take 400 mg by mouth every 6 (six) hours as needed for moderate pain.         [name] losartan-hydrochlorothiazide (HYZAAR) 50-12.5 MG per tablet   Oral    Take 1 tablet by mouth daily.   30 tablet   11   . pantoprazole (PROTONIX) 40 MG tablet   Oral   Take 40 mg by mouth daily.         . potassium chloride SA (K-DUR,KLOR-CON) 20 MEQ tablet   Oral   Take 80 mEq by mouth 2 (two) times daily.         [name] HYDROcodone-acetaminophen (NORCO/VICODIN) 5-325 MG per tablet   Oral   Take 2 tablets by mouth every 6 (six) hours as needed.   20 tablet   0    Triage Vitals: BP 153/74  Pulse 75  Temp(Src) 98.7 F (37.1 C) (Oral)  Resp 22  Wt 310 lb (140.615 kg)  SpO2 97%  Physical Exam  Nursing note and vitals reviewed. Constitutional: She is oriented to person, place, and time. She appears well-developed and well-nourished. No distress.  HENT:  Head: Normocephalic and atraumatic.  Eyes: EOM are normal.  Neck: Neck supple. No tracheal deviation present.  Cardiovascular: Normal rate.   Pulmonary/Chest: Effort normal. No respiratory distress.  Musculoskeletal: She exhibits tenderness. She exhibits no edema.  Swelling to L knee   Neurological: She is alert and oriented to person, place, and time.  Skin: Skin is warm and dry.  Psychiatric: She has a normal mood and affect. Her behavior is normal.    ED Course  Procedures (including critical care time)  DIAGNOSTIC STUDIES: Oxygen Saturation is 97% on room air, adequate by my interpretation.    COORDINATION OF CARE: 10:06 PM-Discussed the chronicity of patient's condition and likelihood for surgery. Recommended patient to talk with her orthopedist. Treatment plan discussed with patient and patient agrees.  Labs Review Labs Reviewed - No data to display Imaging Review Dg Knee Complete 4 Views Left  03/31/2013   CLINICAL DATA Ongoing left knee pain for 1 year.  Medial knee pain.  EXAM LEFT KNEE - COMPLETE 4+ VIEW  COMPARISON Its are prior  FINDINGS Tricompartmental osteoarthritis is present, severe in the medial compartment. There is no fracture or acute osseous abnormality. No  effusion.  IMPRESSION Severe tricompartmental osteoarthritis, worse in the medial compartment which appears similar to the prior MRI from 2013 allowing for differences in technique.  SIGNATURE  Electronically Signed   By: Arvilla Market.D.  On: 03/31/2013 21:57     EKG Interpretation None      MDM  I recommend patient followup with her orthopedist to again discuss knee replacement.  I recommend she stop taking her common on given her prescription for Vicodin.  This is only a temporary measure Final diagnoses:  Osteoarthritis of knee       I personally performed the services described in this documentation, which was scribed in my presence. The recorded information has been reviewed and is accurate.   Garald Balding, NP 03/31/13 2233

## 2013-03-31 NOTE — Discharge Instructions (Signed)
Wear and Tear Disorders of the Knee (Arthritis, Osteoarthritis)  Everyone will experience wear and tear injuries (arthritis, osteoarthritis) of the knee. These are the changes we all get as we age. They come from the joint stress of daily living. The amount of cartilage damage in your knee and your symptoms determine if you need surgery. Mild problems require approximately two months recovery time. More severe problems take several months to recover. With mild problems, your surgeon may find worn and rough cartilage surfaces. With severe changes, your surgeon may find cartilage that has completely worn away and exposed the bone. Loose bodies of bone and cartilage, bone spurs (excess bone growth), and injuries to the menisci (cushions between the large bones of your leg) are also common. All of these problems can cause pain.  For a mild wear and tear problem, rough cartilage may simply need to be shaved and smoothed. For more severe problems with areas of exposed bone, your surgeon may use an instrument for roughing up the bone surfaces to stimulate new cartilage growth. Loose bodies are usually removed. Torn menisci may be trimmed or repaired.  ABOUT THE ARTHROSCOPIC PROCEDURE  Arthroscopy is a surgical technique. It allows your orthopedic surgeon to diagnose and treat your knee injury with accuracy. The surgeon looks into your knee through a small scope. The scope is like a small (pencil-sized) telescope. Arthroscopy is less invasive than open knee surgery. You can expect a more rapid recovery. After the procedure, you will be moved to a recovery area until most of the effects of the medication have worn off. Your caregiver will discuss the test results with you.  RECOVERY  The severity of the arthritis and the type of procedure performed will determine recovery time. Other important factors include age, physical condition, medical conditions, and the type of rehabilitation program. Strengthening your muscles after  arthroscopy helps guarantee a better recovery. Follow your caregiver's instructions. Use crutches, rest, elevate, ice, and do knee exercises as instructed. Your caregivers will help you and instruct you with exercises and other physical therapy required to regain your mobility, muscle strength, and functioning following surgery. Only take over-the-counter or prescription medicines for pain, discomfort, or fever as directed by your caregiver.   SEEK MEDICAL CARE IF:   · There is increased bleeding (more than a small spot) from the wound.  · You notice redness, swelling, or increasing pain in the wound.  · Pus is coming from wound.  · You develop an unexplained oral temperature above 102° F (38.9° C) , or as your caregiver suggests.  · You notice a foul smell coming from the wound or dressing.  · You have severe pain with motion of the knee.  SEEK IMMEDIATE MEDICAL CARE IF:   · You develop a rash.  · You have difficulty breathing.  · You have any allergic problems.  MAKE SURE YOU:   · Understand these instructions.  · Will watch your condition.  · Will get help right away if you are not doing well or get worse.  Document Released: 01/05/2000 Document Revised: 04/01/2011 Document Reviewed: 06/03/2007  [employer]® Patient Information ©2014 [employer].

## 2013-03-31 NOTE — ED Notes (Signed)
Pt w/ non-traumatic left knee pain x1 week progressively worse, pt w/ arthritis in the left knee which pt has gotten injections to the to assist w/ pain management - pt taking tramadol and voltaren w/o relief. Pain worse w/ movement and ambulation. No redness noted on exam.

## 2013-04-01 ENCOUNTER — Other Ambulatory Visit: Payer: Self-pay

## 2013-04-01 MED ORDER — CLONIDINE HCL 0.2 MG PO TABS: 0.2000 mg | ORAL_TABLET | Freq: Two times a day (BID) | ORAL | Status: DC

## 2013-04-02 ENCOUNTER — Other Ambulatory Visit: Payer: No Typology Code available for payment source

## 2013-04-05 ENCOUNTER — Ambulatory Visit: Payer: No Typology Code available for payment source | Admitting: Pulmonary Disease

## 2013-04-11 ENCOUNTER — Encounter (HOSPITAL_COMMUNITY): Payer: Self-pay | Admitting: Emergency Medicine

## 2013-04-11 ENCOUNTER — Emergency Department (HOSPITAL_COMMUNITY)
Admission: EM | Admit: 2013-04-11 | Discharge: 2013-04-11 | Disposition: A | Discharge status: Home / Self Care | Payer: No Typology Code available for payment source | Attending: Emergency Medicine | Admitting: Emergency Medicine

## 2013-04-11 ENCOUNTER — Emergency Department (HOSPITAL_COMMUNITY): Payer: No Typology Code available for payment source

## 2013-04-11 DIAGNOSIS — G4733 Obstructive sleep apnea (adult) (pediatric): Secondary | ICD-10-CM | POA: Insufficient documentation

## 2013-04-11 DIAGNOSIS — R609 Edema, unspecified: Secondary | ICD-10-CM | POA: Insufficient documentation

## 2013-04-11 DIAGNOSIS — R0602 Shortness of breath: Secondary | ICD-10-CM | POA: Insufficient documentation

## 2013-04-11 DIAGNOSIS — I5032 Chronic diastolic (congestive) heart failure: Secondary | ICD-10-CM | POA: Insufficient documentation

## 2013-04-11 DIAGNOSIS — E785 Hyperlipidemia, unspecified: Secondary | ICD-10-CM | POA: Insufficient documentation

## 2013-04-11 DIAGNOSIS — R059 Cough, unspecified: Secondary | ICD-10-CM | POA: Insufficient documentation

## 2013-04-11 DIAGNOSIS — R062 Wheezing: Secondary | ICD-10-CM | POA: Insufficient documentation

## 2013-04-11 DIAGNOSIS — R05 Cough: Secondary | ICD-10-CM | POA: Insufficient documentation

## 2013-04-11 DIAGNOSIS — I2510 Atherosclerotic heart disease of native coronary artery without angina pectoris: Secondary | ICD-10-CM | POA: Insufficient documentation

## 2013-04-11 DIAGNOSIS — I10 Essential (primary) hypertension: Secondary | ICD-10-CM | POA: Insufficient documentation

## 2013-04-11 DIAGNOSIS — Z91040 Latex allergy status: Secondary | ICD-10-CM | POA: Insufficient documentation

## 2013-04-11 DIAGNOSIS — Z79899 Other long term (current) drug therapy: Secondary | ICD-10-CM | POA: Insufficient documentation

## 2013-04-11 DIAGNOSIS — E669 Obesity, unspecified: Secondary | ICD-10-CM | POA: Insufficient documentation

## 2013-04-11 DIAGNOSIS — J019 Acute sinusitis, unspecified: Secondary | ICD-10-CM | POA: Insufficient documentation

## 2013-04-11 DIAGNOSIS — R0789 Other chest pain: Secondary | ICD-10-CM | POA: Insufficient documentation

## 2013-04-11 DIAGNOSIS — IMO0002 Reserved for concepts with insufficient information to code with codable children: Secondary | ICD-10-CM | POA: Insufficient documentation

## 2013-04-11 LAB — CBC WITH DIFFERENTIAL/PLATELET
Basophils Absolute: 0 10*3/uL (ref 0.0–0.1)
Basophils Relative: 0 % (ref 0–1)
Eosinophils Absolute: 0.3 10*3/uL (ref 0.0–0.7)
Eosinophils Relative: 3 % (ref 0–5)
HCT: 38.3 % (ref 36.0–46.0)
HEMOGLOBIN: 12.7 g/dL (ref 12.0–15.0)
LYMPHS ABS: 2.7 10*3/uL (ref 0.7–4.0)
Lymphocytes Relative: 28 % (ref 12–46)
MCH: 30.6 pg (ref 26.0–34.0)
MCHC: 33.2 g/dL (ref 30.0–36.0)
MCV: 92.3 fL (ref 78.0–100.0)
MONOS PCT: 9 % (ref 3–12)
Monocytes Absolute: 0.9 10*3/uL (ref 0.1–1.0)
NEUTROS PCT: 60 % (ref 43–77)
Neutro Abs: 5.8 10*3/uL (ref 1.7–7.7)
PLATELETS: 259 10*3/uL (ref 150–400)
RBC: 4.15 MIL/uL (ref 3.87–5.11)
RDW: 14.1 % (ref 11.5–15.5)
WBC: 9.8 10*3/uL (ref 4.0–10.5)

## 2013-04-11 LAB — BASIC METABOLIC PANEL
BUN: 11 mg/dL (ref 6–23)
CO2: 27 meq/L (ref 19–32)
Calcium: 9.4 mg/dL (ref 8.4–10.5)
Chloride: 101 mEq/L (ref 96–112)
Creatinine, Ser: 0.65 mg/dL (ref 0.50–1.10)
GFR calc Af Amer: >90 mL/min (ref >90)
GFR calc non Af Amer: >90 mL/min (ref >90)
GLUCOSE: 111 mg/dL — AB (ref 70–99)
POTASSIUM: 3.2 meq/L — AB (ref 3.7–5.3)
SODIUM: 142 meq/L (ref 137–147)

## 2013-04-11 LAB — PRO B NATRIURETIC PEPTIDE: PRO B NATRI PEPTIDE: 25.6 pg/mL (ref 0–125)

## 2013-04-11 LAB — TROPONIN I: Troponin I: <0.3 ng/mL (ref <0.30)

## 2013-04-11 MED ORDER — ACETAMINOPHEN 500 MG PO TABS
1000.0000 mg | ORAL_TABLET | Freq: Once | ORAL | Status: AC
Start: 2013-04-11 — End: 2013-04-11
  Administered 2013-04-11: 1000 mg via ORAL
  Filled 2013-04-11: qty 2

## 2013-04-11 MED ORDER — LIDOCAINE VISCOUS 2 % MT SOLN: 15.0000 mL | Freq: Once | OROMUCOSAL | Status: DC

## 2013-04-11 MED ORDER — BENZOCAINE 20 % MT SOLN: Freq: Once | OROMUCOSAL | Status: DC

## 2013-04-11 MED ORDER — ALBUTEROL SULFATE HFA 108 (90 BASE) MCG/ACT IN AERS
4.0000 | INHALATION_SPRAY | Freq: Once | RESPIRATORY_TRACT | Status: AC
  Administered 2013-04-11: 4 via RESPIRATORY_TRACT
  Filled 2013-04-11: qty 6.7

## 2013-04-11 NOTE — Discharge Instructions (Signed)

## 2013-04-11 NOTE — ED Notes (Signed)
Per tech, she attempted to get lab from pt.  Pt jerked away from her and then began telling her to "get someone else who knows how to stick".  I went in to room and pt was raising voice at me telling me that she was not sticking her again.  That "everyone" gets that vein and she shouldn't have had a problem.

## 2013-04-11 NOTE — ED Notes (Signed)
She c/o left-sided facial/sinus area pain/pressure plus much "yellow" nasal drainage; x 3-4 days.  She also tells me she has had brief intermittent episodes of "stabbing" pain in central chest x 2 days.  She is in no distress.

## 2013-04-11 NOTE — ED Notes (Signed)
Patient refused to sign at discharge and states she will not leave until she talks to Dr Doy Mince to "give him the message from my ENT". Dr Doy Mince notified.

## 2013-04-11 NOTE — ED Notes (Signed)
Patient transported to X-ray 

## 2013-04-11 NOTE — ED Notes (Signed)
Pt ambulated to BR w/o assistance. Pt denied SOB during ambulation. Pt in NAD

## 2013-04-11 NOTE — ED Provider Notes (Signed)
CSN: [id number]     Arrival date & time 04/11/13  0539 History   First MD Initiated Contact with Patient 04/11/13 [phone]     Chief Complaint  Patient presents with  . Facial Pain  . Cough  . Shortness of Breath  . Chest Pain     (Consider location/radiation/quality/duration/timing/severity/associated sxs/prior Treatment) Patient is a 53 y.o. female presenting with URI.  URI Presenting symptoms: congestion, cough and facial pain   Presenting symptoms: no fever (chills and sweats)   Severity:  Moderate Onset quality:  Gradual Duration:  5 days Timing:  Constant Progression:  Worsening Chronicity:  Recurrent Relieved by: neti pot. Ineffective treatments: decongestants, afrin. Associated symptoms: sinus pain   Associated symptoms comment:  Mild cough with wheezing and intermittent chest tightness   Past Medical History  Diagnosis Date  . Common migraine   . Morbid obesity   . Hyperlipidemia   . OSA (obstructive sleep apnea)     CPAP dependent  . Carpal tunnel syndrome   . Hypertension   . Allergic rhinitis   . CAD (coronary artery disease)     LHC (11/29/1999):  EF 60%; no significant CAD.  [name] Chronic diastolic CHF (congestive heart failure)     a. Echo (01/21/11):  Vigorous LVF, EF 65-70%, no RWMA, Gr 2 DD.    Past Surgical History  Procedure Laterality Date  . Tubal ligation    . Tonsillectomy    . Uvulopalatopharyngoplasty    . Cesarean section    . Nasal sinus surgery    . Partial hysterectomy     Family History  Problem Relation Age of Onset  . Coronary artery disease      1st degree relatvie <50  . Breast cancer      aunt- ? paternal or maternal  . Asthma Sister   . Heart disease Mother    History  Substance Use Topics  . Smoking status: Never Smoker   . Smokeless tobacco: Never Used  . Alcohol Use: No   OB History   Grav Para Term Preterm Abortions TAB SAB Ect Mult Living                 Review of Systems  Constitutional: Negative for fever  (chills and sweats).  HENT: Positive for congestion.   Respiratory: Positive for cough and shortness of breath.   Cardiovascular: Positive for chest pain.  All other systems reviewed and are negative.      Allergies  Doxycycline; Latex; Ciprofloxacin; Fish allergy; Hydrocortisone; Neomycin; and Sulfonamide derivatives  Home Medications   Current Outpatient Rx  Name  Route  Sig  Dispense  Refill  . albuterol (PROVENTIL HFA;VENTOLIN HFA) 108 (90 BASE) MCG/ACT inhaler   Inhalation   Inhale 2 puffs into the lungs every 4 (four) hours as needed for wheezing.         [name] amLODipine (NORVASC) 5 MG tablet   Oral   Take 1 tablet (5 mg total) by mouth daily.   30 tablet   1   . cloNIDine (CATAPRES) 0.2 MG tablet   Oral   Take 1 tablet (0.2 mg total) by mouth 2 (two) times daily.   60 tablet   3   . diclofenac (VOLTAREN) 75 MG EC tablet   Oral   Take 75 mg by mouth 2 (two) times daily as needed (inflammation).         [name] dicyclomine (BENTYL) 10 MG capsule      Take 1 tab 2-3 times  daily   90 capsule   1   . fluticasone (FLONASE) 50 MCG/ACT nasal spray   Each Nare   Place 1 spray into both nostrils daily.         . furosemide (LASIX) 40 MG tablet   Oral   Take 2 tablets (80 mg total) by mouth daily. For swelling or fluid retention.   60 tablet   11   . guaifenesin (HUMIBID E) 400 MG TABS tablet   Oral   Take 400 mg by mouth every 4 (four) hours.         [name] ibuprofen (ADVIL,MOTRIN) 200 MG tablet   Oral   Take 400 mg by mouth every 6 (six) hours as needed for moderate pain.         [name] losartan-hydrochlorothiazide (HYZAAR) 50-12.5 MG per tablet   Oral   Take 1 tablet by mouth daily.   30 tablet   11   . oxymetazoline (AFRIN) 0.05 % nasal spray   Each Nare   Place 1 spray into both nostrils 2 (two) times daily.         . phenylephrine (SUDAFED PE) 10 MG TABS tablet   Oral   Take 10 mg by mouth every 4 (four) hours as needed (congestion).         .  potassium chloride SA (K-DUR,KLOR-CON) 20 MEQ tablet   Oral   Take 80 mEq by mouth 2 (two) times daily.         . sodium chloride (OCEAN) 0.65 % SOLN nasal spray   Each Nare   Place 1 spray into both nostrils as needed for congestion.          BP 170/87  Pulse 96  Temp(Src) 98.6 F (37 C)  Resp 25  SpO2 97% Physical Exam  Nursing note and vitals reviewed. Constitutional: She is oriented to person, place, and time. She appears well-developed and well-nourished. No distress.  Obese  HENT:  Head: Normocephalic and atraumatic.  Nose: Right sinus exhibits no maxillary sinus tenderness and no frontal sinus tenderness. Left sinus exhibits maxillary sinus tenderness. Left sinus exhibits no frontal sinus tenderness.  Mouth/Throat: Oropharynx is clear and moist.  Mild facial swelling. Worsen on left.  Eyes: Conjunctivae are normal. Pupils are equal, round, and reactive to light. No scleral icterus.  Neck: Neck supple.  Cardiovascular: Normal rate, regular rhythm, normal heart sounds and intact distal pulses.   No murmur heard. Pulmonary/Chest: Effort normal and breath sounds normal. No stridor. No respiratory distress. She has no rales.  Abdominal: Soft. Bowel sounds are normal. She exhibits no distension. There is no tenderness.  Musculoskeletal: Normal range of motion.  Neurological: She is alert and oriented to person, place, and time.  Skin: Skin is warm and dry. No rash noted.  Psychiatric: She has a normal mood and affect. Her behavior is normal.    ED Course  Procedures (including critical care time) Labs Review Labs Reviewed  BASIC METABOLIC PANEL - Abnormal; Notable for the following:    Potassium 3.2 (*)    Glucose, Bld 111 (*)    All other components within normal limits  CBC WITH DIFFERENTIAL  TROPONIN I  PRO B NATRIURETIC PEPTIDE   Imaging Review Dg Chest 2 View  04/11/2013   CLINICAL DATA:  Cough, fever  EXAM: CHEST  2 VIEW  COMPARISON:  02/11/2013  FINDINGS:  Lungs are clear.  No pleural effusion or pneumothorax.  Heart is top-normal in size.  Degenerative changes of  the visualized thoracolumbar spine.  IMPRESSION: No evidence of acute cardiopulmonary disease.   Electronically Signed   By: Julian Hy M.D.   On: 04/11/2013 10:12  All radiology studies independently viewed by me.      EKG Interpretation   Date/Time:  Sunday April 11 2013 08:49:10 EDT Ventricular Rate:  92 PR Interval:  175 QRS Duration: 84 QT Interval:  365 QTC Calculation: 451 R Axis:   125 Text Interpretation:  Sinus rhythm Right axis deviation Low voltage,  precordial leads Probable anteroseptal infarct, old No significant change  was found Confirmed by [facility]  MD, TREY ([id number]) on 04/11/2013 9:13:11 AM      MDM   Final diagnoses:  Acute sinusitis    53 yo female presenting with the CC of sinus pressure and drainage.  No fevers, but sweats and chills.  Mild cough with some chest tightness.  Had minimal wheeze on exam, but was otherwise well appearing, not distressed, sitting upright on side of stretcher.  I do not think her CP is indicative of ACS or PE.  Her symptoms point strongly towards acute sinusitis.  Likely viral.  Advised supportive care and follow up.  Advised to stop using decongestants     Houston Siren III, MD 04/11/13 [phone]

## 2013-05-07 ENCOUNTER — Other Ambulatory Visit: Payer: Self-pay

## 2013-05-07 MED ORDER — AMLODIPINE BESYLATE 5 MG PO TABS: 5.0000 mg | ORAL_TABLET | Freq: Every day | ORAL | Status: DC

## 2013-05-12 ENCOUNTER — Telehealth: Payer: Self-pay | Admitting: *Deleted

## 2013-05-12 NOTE — Telephone Encounter (Signed)
Needs OV with [facility] for Surgical Clearance per [facility]/W[facility] ([phone])  LMOM x 1

## 2013-05-13 NOTE — Telephone Encounter (Signed)
Patient scheduled for Surgical Clearance with Dr Gwenette Greet 08/09/13 at 10:30a Pt stated that surgery is not until Sept d/t her being in school. Appt reminder mailed to patient per patient request.  Nothing further needed.

## 2013-05-19 ENCOUNTER — Telehealth: Payer: Self-pay | Admitting: Internal Medicine

## 2013-05-19 MED ORDER — AMOXICILLIN-POT CLAVULANATE 875-125 MG PO TABS: 875.0000 mg | ORAL_TABLET | Freq: Two times a day (BID) | ORAL | Status: DC

## 2013-05-19 MED ORDER — AMOXICILLIN-POT CLAVULANATE 875-125 MG PO TABS: 1.0000 | ORAL_TABLET | Freq: Two times a day (BID) | ORAL | Status: DC

## 2013-05-19 NOTE — Telephone Encounter (Signed)
Ok to call in Augmentin 875 mg twice daily x 10 days, and ask her to take a probiotic  for 2 weeks like Align or Culturelle. She should be seen if pain worsens or if pain doesn't resolve  after finishing antibiotics

## 2013-05-19 NOTE — Telephone Encounter (Signed)
Pt states she is having pain in her left side again and thinks she is having a diverticulitis flare. Pt states she saw Amy for this in the past. Please advise.

## 2013-05-19 NOTE — Telephone Encounter (Signed)
Left message for pt to call back.  Spoke with pt and she is aware. Script sent to the pharmacy.

## 2013-05-19 NOTE — Addendum Note (Signed)
Addended by: Rosanne Sack R on: 05/19/2013 01:05 PM   Modules accepted: Orders

## 2013-05-25 ENCOUNTER — Ambulatory Visit: Payer: No Typology Code available for payment source | Admitting: Dietician

## 2013-06-04 ENCOUNTER — Emergency Department (HOSPITAL_COMMUNITY)
Admission: EM | Admit: 2013-06-04 | Discharge: 2013-06-04 | Disposition: A | Discharge status: Home / Self Care | Payer: No Typology Code available for payment source | Attending: Emergency Medicine | Admitting: Emergency Medicine

## 2013-06-04 ENCOUNTER — Encounter (HOSPITAL_COMMUNITY): Payer: Self-pay | Admitting: Emergency Medicine

## 2013-06-04 ENCOUNTER — Emergency Department (HOSPITAL_COMMUNITY): Payer: No Typology Code available for payment source

## 2013-06-04 DIAGNOSIS — I2510 Atherosclerotic heart disease of native coronary artery without angina pectoris: Secondary | ICD-10-CM | POA: Insufficient documentation

## 2013-06-04 DIAGNOSIS — Z79899 Other long term (current) drug therapy: Secondary | ICD-10-CM | POA: Insufficient documentation

## 2013-06-04 DIAGNOSIS — Z91040 Latex allergy status: Secondary | ICD-10-CM | POA: Insufficient documentation

## 2013-06-04 DIAGNOSIS — G4733 Obstructive sleep apnea (adult) (pediatric): Secondary | ICD-10-CM | POA: Insufficient documentation

## 2013-06-04 DIAGNOSIS — IMO0002 Reserved for concepts with insufficient information to code with codable children: Secondary | ICD-10-CM | POA: Insufficient documentation

## 2013-06-04 DIAGNOSIS — Z791 Long term (current) use of non-steroidal anti-inflammatories (NSAID): Secondary | ICD-10-CM | POA: Insufficient documentation

## 2013-06-04 DIAGNOSIS — Z9981 Dependence on supplemental oxygen: Secondary | ICD-10-CM | POA: Insufficient documentation

## 2013-06-04 DIAGNOSIS — Z9889 Other specified postprocedural states: Secondary | ICD-10-CM | POA: Insufficient documentation

## 2013-06-04 DIAGNOSIS — J189 Pneumonia, unspecified organism: Secondary | ICD-10-CM

## 2013-06-04 DIAGNOSIS — G43909 Migraine, unspecified, not intractable, without status migrainosus: Secondary | ICD-10-CM | POA: Insufficient documentation

## 2013-06-04 DIAGNOSIS — J159 Unspecified bacterial pneumonia: Secondary | ICD-10-CM | POA: Insufficient documentation

## 2013-06-04 DIAGNOSIS — I10 Essential (primary) hypertension: Secondary | ICD-10-CM | POA: Insufficient documentation

## 2013-06-04 DIAGNOSIS — I5032 Chronic diastolic (congestive) heart failure: Secondary | ICD-10-CM | POA: Insufficient documentation

## 2013-06-04 DIAGNOSIS — Z9089 Acquired absence of other organs: Secondary | ICD-10-CM | POA: Insufficient documentation

## 2013-06-04 MED ORDER — PREDNISONE (PAK) 10 MG PO TABS: ORAL_TABLET | Freq: Every day | ORAL | Status: DC

## 2013-06-04 MED ORDER — ALBUTEROL SULFATE HFA 108 (90 BASE) MCG/ACT IN AERS
2.0000 | INHALATION_SPRAY | Freq: Once | RESPIRATORY_TRACT | Status: AC
  Administered 2013-06-04: 2 via RESPIRATORY_TRACT
  Filled 2013-06-04: qty 6.7

## 2013-06-04 MED ORDER — ALBUTEROL SULFATE (2.5 MG/3ML) 0.083% IN NEBU
5.0000 mg | INHALATION_SOLUTION | Freq: Once | RESPIRATORY_TRACT | Status: AC
  Administered 2013-06-04: 5 mg via RESPIRATORY_TRACT
  Filled 2013-06-04: qty 6

## 2013-06-04 MED ORDER — HYDROCODONE-HOMATROPINE 5-1.5 MG/5ML PO SYRP: 5.0000 mL | ORAL_SOLUTION | Freq: Four times a day (QID) | ORAL | Status: DC | PRN

## 2013-06-04 MED ORDER — AZITHROMYCIN 250 MG PO TABS: 250.0000 mg | ORAL_TABLET | Freq: Every day | ORAL | Status: DC

## 2013-06-04 NOTE — ED Provider Notes (Signed)
CSN: [id number]     Arrival date & time 06/04/13  1555 History   First MD Initiated Contact with Patient 06/04/13 1610     Chief Complaint  Patient presents with  . Cough     (Consider location/radiation/quality/duration/timing/severity/associated sxs/prior Treatment) HPI  Patient presents with c/o productive cough, SOB, chest soreness with coughing.  She first developed symptoms one week ago with nasal congestion, followed by sore throat, now with cough.  Cough is productive of green and occasionally blood tinged-sputum.  States her "chest feels full of fluid."  Is taking her lasix as prescribed.  Has sick contact in 40 year old granddaughter who was diagnosed with bronchitis last week.  Denies fevers, leg swelling, abdominal pain.   No recent immobilization, no exogenous estrogen, no hx blood clots.   Past Medical History  Diagnosis Date  . Common migraine   . Morbid obesity   . Hyperlipidemia   . OSA (obstructive sleep apnea)     CPAP dependent  . Carpal tunnel syndrome   . Hypertension   . Allergic rhinitis   . CAD (coronary artery disease)     LHC (11/29/1999):  EF 60%; no significant CAD.  [name] Chronic diastolic CHF (congestive heart failure)     a. Echo (01/21/11):  Vigorous LVF, EF 65-70%, no RWMA, Gr 2 DD.    Past Surgical History  Procedure Laterality Date  . Tubal ligation    . Tonsillectomy    . Uvulopalatopharyngoplasty    . Cesarean section    . Nasal sinus surgery    . Partial hysterectomy     Family History  Problem Relation Age of Onset  . Coronary artery disease      1st degree relatvie <50  . Breast cancer      aunt- ? paternal or maternal  . Asthma Sister   . Heart disease Mother    History  Substance Use Topics  . Smoking status: Never Smoker   . Smokeless tobacco: Never Used  . Alcohol Use: No   OB History   Grav Para Term Preterm Abortions TAB SAB Ect Mult Living                 Review of Systems  Constitutional: Negative for fever and  chills.  HENT: Positive for congestion and sore throat.   Eyes: Negative for discharge.  Respiratory: Positive for cough and shortness of breath.   Gastrointestinal: Positive for nausea and vomiting. Negative for abdominal pain, diarrhea, constipation and blood in stool.  Genitourinary: Negative for dysuria, urgency and frequency.  Musculoskeletal: Negative for neck pain and neck stiffness.  Neurological: Positive for light-headedness.  All other systems reviewed and are negative.     Allergies  Doxycycline; Latex; Ciprofloxacin; Fish allergy; Hydrocortisone; Neomycin; and Sulfonamide derivatives  Home Medications   Prior to Admission medications   Medication Sig Start Date End Date Taking? Authorizing Provider  albuterol (PROVENTIL HFA;VENTOLIN HFA) 108 (90 BASE) MCG/ACT inhaler Inhale 2 puffs into the lungs every 4 (four) hours as needed for wheezing.    Historical Provider, MD  amLODipine (NORVASC) 5 MG tablet Take 1 tablet (5 mg total) by mouth daily. 05/07/13   Minus Breeding, MD  diclofenac (VOLTAREN) 75 MG EC tablet Take 75 mg by mouth 2 (two) times daily as needed (inflammation).    Historical Provider, MD  dicyclomine (BENTYL) 10 MG capsule Take 1 tab 2-3 times daily 02/16/13   Amy S Esterwood, PA-C  fluticasone (FLONASE) 50 MCG/ACT nasal spray Place  1 spray into both nostrils daily.    Historical Provider, MD  furosemide (LASIX) 40 MG tablet Take 2 tablets (80 mg total) by mouth daily. For swelling or fluid retention. 03/26/13   Liliane Shi, PA-C  guaifenesin (HUMIBID E) 400 MG TABS tablet Take 400 mg by mouth every 4 (four) hours.    Historical Provider, MD  ibuprofen (ADVIL,MOTRIN) 200 MG tablet Take 400 mg by mouth every 6 (six) hours as needed for moderate pain.    Historical Provider, MD  losartan-hydrochlorothiazide (HYZAAR) 50-12.5 MG per tablet Take 1 tablet by mouth daily. 03/26/13   Liliane Shi, PA-C  oxymetazoline (AFRIN) 0.05 % nasal spray Place 1 spray into both  nostrils 2 (two) times daily.    Historical Provider, MD  phenylephrine (SUDAFED PE) 10 MG TABS tablet Take 10 mg by mouth every 4 (four) hours as needed (congestion).    Historical Provider, MD  potassium chloride SA (K-DUR,KLOR-CON) 20 MEQ tablet Take 80 mEq by mouth 2 (two) times daily.    Historical Provider, MD  sodium chloride (OCEAN) 0.65 % SOLN nasal spray Place 1 spray into both nostrils as needed for congestion.    Historical Provider, MD   BP 111/93  Pulse 112  Temp(Src) 99 F (37.2 C) (Oral)  Resp 20  SpO2 98% Physical Exam  Nursing note and vitals reviewed. Constitutional: She appears well-developed and well-nourished. No distress.  HENT:  Head: Normocephalic and atraumatic.  Eyes: Conjunctivae are normal.  Neck: Neck supple.  Cardiovascular: Normal rate, regular rhythm and intact distal pulses.   Pulmonary/Chest: Effort normal and breath sounds normal. No respiratory distress. She has no wheezes. She has no rales.  Abdominal: Soft. She exhibits no distension. There is no tenderness. There is no rebound and no guarding.  Musculoskeletal: She exhibits no edema.  Neurological: She is alert.  Skin: She is not diaphoretic.    ED Course  Procedures (including critical care time) Labs Review Labs Reviewed - No data to display  Imaging Review Dg Chest 2 View  06/04/2013   CLINICAL DATA:  Shortness of 10 chest pain with productive cough  EXAM: CHEST  2 VIEW  COMPARISON:  DG CHEST 2 VIEW dated 04/11/2013  FINDINGS: The lungs are adequately inflated. The interstitial markings are mildly increased diffusely. This is not a new finding. The cardiopericardial silhouette is mildly enlarged. The pulmonary vascularity is not clearly engorged. There is mild tortuosity of the descending thoracic aorta. There is no pleural effusion or pneumothorax or pneumomediastinum. The observed portions of the bony thorax exhibit no acute abnormalities. There is curvature of the mid thoracic spine with  convexity towards the right.  IMPRESSION: There are diffusely increased interstitial markings bilaterally which are not new. This may reflect an underlying pulmonary fibrotic process, although low-grade compensated CHF or mild interstitial pneumonia could produce similar findings. Given the lack of significant change since previous studies, chronic processes new are felt most likely.   Electronically Signed   By: David  Martinique   On: 06/04/2013 17:55     EKG Interpretation None      Filed Vitals:   06/04/13 1819  BP: 133/70  Pulse: 108  Temp: 98.8 F (37.1 C)  Resp: 18    MDM   Final diagnoses:  CAP (community acquired pneumonia)    Afebrile, nontoxic with 7 days of URI, then cough productive of green and occasionally blood streaked sputum.  She has a known close sick contact with similar symptoms.  She is  low risk for PE.  She actually denies that she has CHF but does take Lasix.  I doubt ACS.  CXR shows what are likely chronic changes but may be small pneumonia.  Will d/c home with z-pak, otherwise treatment for what is clinically bronchitis.  She is tachycardic but this improved with rest, despite albuterol neb being used. D/C home PCP follow up.  Discussed result, findings, treatment, and follow up  with patient.  Pt given return precautions.  Pt verbalizes understanding and agrees with plan.       Clayton Bibles, PA-C 06/04/13 1845

## 2013-06-04 NOTE — ED Notes (Signed)
Pt alert, arrives from home, c/o cough, sob, onset was last week, states "not getting any better", tolerating oral secretions well, resp even unlabored

## 2013-06-04 NOTE — Discharge Instructions (Signed)
Read the information below.  Use the prescribed medication as directed.  Please discuss all new medications with your pharmacist.  You may return to the Emergency Department at any time for worsening condition or any new symptoms that concern you.  If you develop chest pain, worsening shortness of breath, fever, you pass out, or become weak or dizzy, return to the ER for a recheck.      Pneumonia, Adult Pneumonia is an infection of the lungs.  CAUSES Pneumonia may be caused by bacteria or a virus. Usually, these infections are caused by breathing infectious particles into the lungs (respiratory tract). SYMPTOMS   Cough.  Fever.  Chest pain.  Increased rate of breathing.  Wheezing.  Mucus production. DIAGNOSIS  If you have the common symptoms of pneumonia, your caregiver will typically confirm the diagnosis with a chest X-ray. The X-ray will show an abnormality in the lung (pulmonary infiltrate) if you have pneumonia. Other tests of your blood, urine, or sputum may be done to find the specific cause of your pneumonia. Your caregiver may also do tests (blood gases or pulse oximetry) to see how well your lungs are working. TREATMENT  Some forms of pneumonia may be spread to other people when you cough or sneeze. You may be asked to wear a mask before and during your exam. Pneumonia that is caused by bacteria is treated with antibiotic medicine. Pneumonia that is caused by the influenza virus may be treated with an antiviral medicine. Most other viral infections must run their course. These infections will not respond to antibiotics.  PREVENTION A pneumococcal shot (vaccine) is available to prevent a common bacterial cause of pneumonia. This is usually suggested for:  People over 1 years old.  Patients on chemotherapy.  People with chronic lung problems, such as bronchitis or emphysema.  People with immune system problems. If you are over 65 or have a high risk condition, you may  receive the pneumococcal vaccine if you have not received it before. In some countries, a routine influenza vaccine is also recommended. This vaccine can help prevent some cases of pneumonia.You may be offered the influenza vaccine as part of your care. If you smoke, it is time to quit. You may receive instructions on how to stop smoking. Your caregiver can provide medicines and counseling to help you quit. HOME CARE INSTRUCTIONS   Cough suppressants may be used if you are losing too much rest. However, coughing protects you by clearing your lungs. You should avoid using cough suppressants if you can.  Your caregiver may have prescribed medicine if he or she thinks your pneumonia is caused by a bacteria or influenza. Finish your medicine even if you start to feel better.  Your caregiver may also prescribe an expectorant. This loosens the mucus to be coughed up.  Only take over-the-counter or prescription medicines for pain, discomfort, or fever as directed by your caregiver.  Do not smoke. Smoking is a common cause of bronchitis and can contribute to pneumonia. If you are a smoker and continue to smoke, your cough may last several weeks after your pneumonia has cleared.  A cold steam vaporizer or humidifier in your room or home may help loosen mucus.  Coughing is often worse at night. Sleeping in a semi-upright position in a recliner or using a couple pillows under your head will help with this.  Get rest as you feel it is needed. Your body will usually let you know when you need to rest. SEEK  IMMEDIATE MEDICAL CARE IF:   Your illness becomes worse. This is especially true if you are elderly or weakened from any other disease.  You cannot control your cough with suppressants and are losing sleep.  You begin coughing up blood.  You develop pain which is getting worse or is uncontrolled with medicines.  You have a fever.  Any of the symptoms which initially brought you in for treatment  are getting worse rather than better.  You develop shortness of breath or chest pain. MAKE SURE YOU:   Understand these instructions.  Will watch your condition.  Will get help right away if you are not doing well or get worse. Document Released: 01/07/2005 Document Revised: 04/01/2011 Document Reviewed: 03/29/2010 [facility] Patient Information 2014 [place], [place].

## 2013-06-04 NOTE — ED Notes (Signed)
RT informed of breathing tx. Will be down shortly.

## 2013-06-04 NOTE — ED Provider Notes (Signed)
Medical screening examination/treatment/procedure(s) were conducted as a shared visit with non-physician practitioner(s) and myself.  I personally evaluated the patient during the encounter.   EKG Interpretation None     Presents with cough and congestion. Cough is productive of green sputum. Patient has had recent contact with her granddaughter who was diagnosed with bronchitis. Patient hypoxic. Chest x-ray does not show any evidence of fluid overload or pneumonia. Outpatient treatment for bronchitis.  Orpah Greek, MD 06/04/13 [phone]

## 2013-06-09 ENCOUNTER — Other Ambulatory Visit: Payer: Self-pay | Admitting: *Deleted

## 2013-06-09 MED ORDER — AMLODIPINE BESYLATE 5 MG PO TABS: 5.0000 mg | ORAL_TABLET | Freq: Every day | ORAL | Status: DC

## 2013-06-10 ENCOUNTER — Encounter: Payer: Self-pay | Admitting: Adult Health

## 2013-06-10 ENCOUNTER — Ambulatory Visit (INDEPENDENT_AMBULATORY_CARE_PROVIDER_SITE_OTHER): Payer: No Typology Code available for payment source | Admitting: Adult Health

## 2013-06-10 ENCOUNTER — Encounter: Payer: Self-pay | Admitting: Critical Care Medicine

## 2013-06-10 ENCOUNTER — Telehealth: Payer: Self-pay | Admitting: Pulmonary Disease

## 2013-06-10 ENCOUNTER — Ambulatory Visit (INDEPENDENT_AMBULATORY_CARE_PROVIDER_SITE_OTHER)
Admission: RE | Admit: 2013-06-10 | Discharge: 2013-06-10 | Disposition: A | Discharge status: Home / Self Care | Payer: No Typology Code available for payment source | Source: Ambulatory Visit | Attending: Adult Health | Admitting: Adult Health

## 2013-06-10 VITALS — BP 124/86 | HR 73 | Temp 97.8°F | Ht 63.0 in | Wt 313.2 lb

## 2013-06-10 DIAGNOSIS — J189 Pneumonia, unspecified organism: Secondary | ICD-10-CM

## 2013-06-10 DIAGNOSIS — J209 Acute bronchitis, unspecified: Secondary | ICD-10-CM

## 2013-06-10 MED ORDER — LEVALBUTEROL HCL 0.63 MG/3ML IN NEBU
0.6300 mg | INHALATION_SOLUTION | Freq: Once | RESPIRATORY_TRACT | Status: AC
  Administered 2013-06-10: 0.63 mg via RESPIRATORY_TRACT

## 2013-06-10 MED ORDER — AMOXICILLIN-POT CLAVULANATE 875-125 MG PO TABS: 1.0000 | ORAL_TABLET | Freq: Two times a day (BID) | ORAL | Status: AC

## 2013-06-10 NOTE — Assessment & Plan Note (Addendum)
Slow to resolve flare  CXR w/ out acute changes -increased interstitial markings. Recent CT chest 01/2013 showed no acute lung process W/ low lung volumes with patchy areas of atelectasis. Does not appear to be in fluid overload.   Plan   Augmentin 875mg  Twice daily  For 7 days  Mucinex DM Twice daily  As needed  Cough/congestion.  Fluids and rest  Chest xray today .  Begin Symbicort 80/4.29mcg 2 puffs Twice daily  - rinse after use.  Please contact office for sooner follow up if symptoms do not improve or worsen or seek emergency care  follow up Dr. Gwenette Greet in 3-4 weeks and As needed

## 2013-06-10 NOTE — Patient Instructions (Signed)
Augmentin 875mg  Twice daily  For 7 days  Mucinex DM Twice daily  As needed  Cough/congestion.  Fluids and rest  Chest xray today .  Begin Symbicort 80/4.76mcg 2 puffs Twice daily  - rinse after use.  Please contact office for sooner follow up if symptoms do not improve or worsen or seek emergency care  follow up Dr. Gwenette Greet in 3-4 weeks and As needed

## 2013-06-10 NOTE — Telephone Encounter (Signed)
Called and spoke with pt and she stated that she has been trying to get her pantoprazole 40 mg  1 daily refilled since last week.  She stated that the pharmacy has called to get this refilled for her but they have not heard back from [place].  Pt stated that she really needs this refill sent to the pharmacy.  TP please advise if ok to send in the refill for the pt.  Pt was seen by you this am.  Thanks  Allergies  Allergen Reactions  . Doxycycline Nausea And Vomiting  . Latex Hives  . Ciprofloxacin Rash  . Fish Allergy Rash    Only reaction to Mackerel.  No other issues with any type of fish or shellfish currently   . Hydrocortisone Rash  . Neomycin Rash  . Sulfonamide Derivatives Swelling and Rash     Current Outpatient Prescriptions on File Prior to Visit  Medication Sig Dispense Refill  . albuterol (PROVENTIL HFA;VENTOLIN HFA) 108 (90 BASE) MCG/ACT inhaler Inhale 2 puffs into the lungs every 4 (four) hours as needed for wheezing.      [name] amLODipine (NORVASC) 5 MG tablet Take 1 tablet (5 mg total) by mouth daily.  90 tablet  0  . amoxicillin-clavulanate (AUGMENTIN) 875-125 MG per tablet Take 1 tablet by mouth 2 (two) times daily.  14 tablet  0  . diclofenac (VOLTAREN) 75 MG EC tablet Take 75 mg by mouth 2 (two) times daily as needed (inflammation).      [name] dicyclomine (BENTYL) 10 MG capsule Take 1 tab 2-3 times daily  90 capsule  1  . fluticasone (FLONASE) 50 MCG/ACT nasal spray Place 1 spray into both nostrils daily.      . furosemide (LASIX) 40 MG tablet Take 2 tablets (80 mg total) by mouth daily. For swelling or fluid retention.  60 tablet  11  . guaifenesin (HUMIBID E) 400 MG TABS tablet Take 400 mg by mouth every 4 (four) hours.      [name] HYDROcodone-homatropine (HYCODAN) 5-1.5 MG/5ML syrup Take 5 mLs by mouth every 6 (six) hours as needed for cough.  120 mL  0  . losartan-hydrochlorothiazide (HYZAAR) 50-12.5 MG per tablet Take 1 tablet by mouth daily.  30 tablet  11  . oxymetazoline (AFRIN)  0.05 % nasal spray Place 1 spray into both nostrils 2 (two) times daily.      . phenylephrine (SUDAFED PE) 10 MG TABS tablet Take 10 mg by mouth every 4 (four) hours as needed (congestion).      . potassium chloride SA (K-DUR,KLOR-CON) 20 MEQ tablet Take 80 mEq by mouth 2 (two) times daily.      . sodium chloride (OCEAN) 0.65 % SOLN nasal spray Place 1 spray into both nostrils as needed for congestion.       No current facility-administered medications on file prior to visit.

## 2013-06-10 NOTE — Progress Notes (Signed)
   Subjective:    Patient ID: [name], female    DOB: 1961-01-12, 53 y.o.   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 CXR w/ no sign of acute changes, noted diffuse interstitial markings. Says got only minimally improved.  Complains of prod cough with green mucus, wheezing, dyspnea, tightness.  finished zpak and pred pak yesterday.  would like to discuss beginning nebs, says RT in ER said she should be on this at home.  Was started on QVAR last ov but not taking .  Patient denies any sinus congesiton,  hemoptysis, orthopnea, PND, or increased leg swelling .   Review of Systems Constitutional:   No  weight loss, night sweats,  Fevers, chills, fatigue, or  lassitude.  HEENT:   No headaches,  Difficulty swallowing,  Tooth/dental problems, or  Sore throat,                No sneezing, itching, ear ache, nasal congestion, post nasal drip,   CV:  No chest pain,  Orthopnea, PND, swelling in lower extremities, anasarca, dizziness, palpitations, syncope.   GI  No heartburn, indigestion, abdominal pain, nausea, vomiting, diarrhea, change in bowel habits, loss of appetite, bloody stools.   Resp: No shortness of breath with exertion or at rest.  No excess mucus, no productive cough,  No non-productive cough,  No coughing up of blood.  No change in color of mucus.  No wheezing.  No chest wall deformity  Skin: no rash or lesions.  GU: no dysuria, change in color of urine, no urgency or frequency.  No flank pain, no hematuria   MS:  No joint pain or swelling.  No decreased range of motion.  No back pain.  Psych:  No change in mood or affect. No depression or anxiety.  No memory loss.         Objective:   Physical Exam GEN: A/Ox3; pleasant , NAD, obese female   HEENT:  [place]/AT,  EACs-clear, TMs-wnl, NOSE-clear, THROAT-clear, no lesions, no postnasal  drip or exudate noted.   NECK:  Supple w/ fair ROM; no JVD; normal carotid impulses w/o bruits; no thyromegaly or nodules palpated; no lymphadenopathy.  RESP  Diminished BS in bases, w/o, wheezes/ rales/ or rhonchi.no accessory muscle use, no dullness to percussion  CARD:  RRR, no m/r/g  ,tr peripheral edema, pulses intact, no cyanosis or clubbing.  GI:   Soft & nt; nml bowel sounds; no organomegaly or masses detected.  Musco: Warm bil, no deformities or joint swelling noted.   Neuro: alert, no focal deficits noted.    Skin: Warm, no lesions or rashes         Assessment & Plan:

## 2013-06-11 NOTE — Progress Notes (Signed)
Quick Note:  LMTCB x1 per 5.21.15 phone note ______

## 2013-06-11 NOTE — Progress Notes (Signed)
Ov reviewed.  [name], please see if we can get records from ENT  ([place]) about this pt so I can review next week.

## 2013-06-11 NOTE — Telephone Encounter (Signed)
Protonix not active on med list Spoke with TP, okay to add and refill LMOM TCB x1 for pt to make her aware - was also going to notify her of her 5/21 cxr results:  Result Notes    Notes Recorded by Melvenia Needles, NP on 06/11/2013 at 5:15 PM No sign of PNA  Cont w/ ov recs  Mild congestion noted ? Fluid  follow up as planned and As needed  Please contact office for sooner follow up if symptoms do not improve or worsen or seek emergency care        **pt does have Lasix on file...how often does she take?

## 2013-06-15 ENCOUNTER — Telehealth: Payer: Self-pay | Admitting: Adult Health

## 2013-06-15 MED ORDER — PANTOPRAZOLE SODIUM 40 MG PO TBEC: 40.0000 mg | DELAYED_RELEASE_TABLET | Freq: Every day | ORAL | Status: DC

## 2013-06-15 NOTE — Telephone Encounter (Signed)
Results have been explained to patient, pt expressed understanding. Pantoprazole 40mg  - 1 po QD #30 x 6RF sent to [place] Nothing further needed.

## 2013-06-15 NOTE — Telephone Encounter (Signed)
Pt calling for cxr results--made aware of results. Notes Recorded by Melvenia Needles, NP on 06/11/2013 at 5:15 PM No sign of PNA  Cont w/ ov recs  Mild congestion noted ? Fluid  follow up as planned and As needed  Please contact office for sooner follow up if symptoms do not improve or worsen or seek emergency care   Pt refused to make sooner appt with [facility] to f/u on cxr-- patient chose to keep 08/09/13 appt with [facility] at 10:30 Pt states that she is going to be out of town for most of June.  Pantoprazole 40mg  - 1 po QD #30 x 6RF sent to [place]  Nothing further needed.

## 2013-06-18 MED ORDER — PANTOPRAZOLE SODIUM 40 MG PO TBEC: 40.0000 mg | DELAYED_RELEASE_TABLET | Freq: Every day | ORAL | Status: DC

## 2013-06-18 NOTE — Telephone Encounter (Signed)
Received call from pharmacist Heidi at [facility] > pt's insurance dictates that she receive the Pantoprazole given below at #90.  Verbal authorization given to [facility] for this and med list updated for #90 with 1 additional refill.

## 2013-06-18 NOTE — Addendum Note (Signed)
Addended by: Parke Poisson E on: 06/18/2013 12:28 PM   Modules accepted: Orders

## 2013-06-24 NOTE — Progress Notes (Signed)
Cardiology Office Note   Date:  06/25/2013   ID:  [name], DOB 1960-06-22, MRN [id number]  PCP:  No PCP Per Patient  Cardiologist:  Dr. Minus Breeding     History of Present Illness: [name] is a 53 y.o. female with a history of diastolic CHF, HTN, HL, sleep apnea. Cardiac catheterization in 2001 demonstrated no significant CAD. She returns for follow up.  She was recently treated for pneumonia/bronchitis.  She followed up with pulmonary and a repeat CXR demonstrated some vascular congestion but no infiltrate.  She was continued on Augmentin for one more week. She feels like she has been slow to recover. She notes a mild cough.  She denies fever.  She ran out of Lasix for a few days.  Her weight went up 5 lbs.  She resumed this yesterday.  She notes 2 pillow orthopnea and has had what sounds like PND.  No chest pain or syncope.    Recent Labs: 02/11/2013: ALT 22  04/11/2013: Creatinine 0.65; Hemoglobin 12.7; Potassium 3.2*; Pro B Natriuretic peptide (BNP) 25.6   CXR (06/10/13): IMPRESSION: Pulmonary vascular congestion suggesting mild fluid overload.   Wt Readings from Last 3 Encounters:  06/25/13 307 lb (139.254 kg)  06/10/13 313 lb 3.2 oz (142.067 kg)  03/31/13 310 lb (140.615 kg)     Past Medical History  Diagnosis Date  . Common migraine   . Morbid obesity   . Hyperlipidemia   . OSA (obstructive sleep apnea)     CPAP dependent  . Carpal tunnel syndrome   . Hypertension   . Allergic rhinitis   . CAD (coronary artery disease)     LHC (11/29/1999):  EF 60%; no significant CAD.  [name] Chronic diastolic CHF (congestive heart failure)     a. Echo (01/21/11):  Vigorous LVF, EF 65-70%, no RWMA, Gr 2 DD.     Current Outpatient Prescriptions  Medication Sig Dispense Refill  . amLODipine (NORVASC) 5 MG tablet Take 1 tablet (5 mg total) by mouth daily.  90 tablet  0  . diclofenac (VOLTAREN) 75 MG EC tablet Take 75 mg by mouth 2 (two) times daily as needed  (inflammation).      [name] dicyclomine (BENTYL) 10 MG capsule Take 1 tab 2-3 times daily  90 capsule  1  . fluticasone (FLONASE) 50 MCG/ACT nasal spray Place 1 spray into both nostrils daily.      . furosemide (LASIX) 40 MG tablet Take 2 tablets (80 mg total) by mouth daily. For swelling or fluid retention.  60 tablet  11  . guaifenesin (HUMIBID E) 400 MG TABS tablet Take 400 mg by mouth every 4 (four) hours.      [name] losartan-hydrochlorothiazide (HYZAAR) 50-12.5 MG per tablet Take 1 tablet by mouth daily.  30 tablet  11  . pantoprazole (PROTONIX) 40 MG tablet Take 1 tablet (40 mg total) by mouth daily.  90 tablet  1  . potassium chloride SA (K-DUR,KLOR-CON) 20 MEQ tablet Take 80 mEq by mouth 2 (two) times daily.      . sodium chloride (OCEAN) 0.65 % SOLN nasal spray Place 1 spray into both nostrils as needed for congestion.       No current facility-administered medications for this visit.    Allergies:   Doxycycline; Latex; Ciprofloxacin; Fish allergy; Hydrocortisone; Neomycin; and Sulfonamide derivatives   Social History:  The patient  reports that she has never smoked. She has never used smokeless tobacco. She reports that she does  not drink alcohol or use illicit drugs.   Family History:  The patient's family history includes Asthma in her sister; Breast cancer in an other family member; Coronary artery disease in an other family member; Heart disease in her mother.   ROS:  Please see the history of present illness.        All other systems reviewed and negative.   PHYSICAL EXAM: VS:  BP 138/88  Pulse 72  Ht 5\' 3"  (1.6 m)  Wt 307 lb (139.254 kg)  BMI 54.40 kg/m2 Well nourished, well developed, in no acute distress HEENT: normal Neck: I cannot assess JVD Cardiac:  normal S1, S2; RRR; no murmur Lungs:  clear to auscultation bilaterally, no wheezing, rhonchi or rales Abd: soft, nontender, no hepatomegaly Ext: very trace bilateral edema Skin: warm and dry Neuro:  CNs 2-12 intact, no  focal abnormalities note     ECG:  NSR, HR 72, normal axis, NSSTTW changes  ASSESSMENT AND PLAN:  1. Hypertension:  Fair control. 2. Chronic Diastolic CHF:  I believe she is somewhat volume overloaded.  I will increase her Lasix to 80 in AM and 40 in PM for 2 days with extra K+ 40 mEq x 2 days.  Check BMET today.  If K+ running low, I will consider changing HCTZ to Spironolactone with close follow up in renal fxn and K+.    3. Bronchitis:  F/u with pulmonary as planned.  4. Disposition:  F/u with me in 1 month.  Signed, Richardson Dopp, PA-C  06/25/2013 8:44 AM

## 2013-06-25 ENCOUNTER — Telehealth: Payer: Self-pay | Admitting: *Deleted

## 2013-06-25 ENCOUNTER — Encounter: Payer: Self-pay | Admitting: Physician Assistant

## 2013-06-25 ENCOUNTER — Ambulatory Visit (INDEPENDENT_AMBULATORY_CARE_PROVIDER_SITE_OTHER): Payer: No Typology Code available for payment source | Admitting: Physician Assistant

## 2013-06-25 VITALS — BP 138/88 | HR 72 | Ht 63.0 in | Wt 307.0 lb

## 2013-06-25 DIAGNOSIS — I509 Heart failure, unspecified: Secondary | ICD-10-CM

## 2013-06-25 DIAGNOSIS — I10 Essential (primary) hypertension: Secondary | ICD-10-CM

## 2013-06-25 DIAGNOSIS — I5032 Chronic diastolic (congestive) heart failure: Secondary | ICD-10-CM

## 2013-06-25 LAB — BASIC METABOLIC PANEL
BUN: 9 mg/dL (ref 6–23)
CHLORIDE: 102 meq/L (ref 96–112)
CO2: 29 meq/L (ref 19–32)
Calcium: 9.1 mg/dL (ref 8.4–10.5)
Creatinine, Ser: 0.5 mg/dL (ref 0.4–1.2)
GFR: 154.26 mL/min (ref >60.00)
Glucose, Bld: 98 mg/dL (ref 70–99)
Potassium: 3.2 mEq/L — ABNORMAL LOW (ref 3.5–5.1)
SODIUM: 139 meq/L (ref 135–145)

## 2013-06-25 MED ORDER — SPIRONOLACTONE 25 MG PO TABS: 25.0000 mg | ORAL_TABLET | Freq: Every day | ORAL | Status: DC

## 2013-06-25 MED ORDER — LOSARTAN POTASSIUM 50 MG PO TABS: 50.0000 mg | ORAL_TABLET | Freq: Every day | ORAL | Status: DC

## 2013-06-25 NOTE — Telephone Encounter (Signed)
pt notified about lab results and med changes and repeat bmet's with verbal understanding

## 2013-06-25 NOTE — Patient Instructions (Signed)
INCREASE LASIX TO 80 MG IN THE MORNING AND 40 MG IN THE PM FOR 2 DAYS; AFTER THE 2 DAYS GO BACK TO YOUR REGULAR DOSE OF LASIX 80 MG DAILY  TAKE AN EXTRA 40 MEQ OF POTASSIUM FOR 2 DAYS THEN GO BACK TO YOUR REGULAR DOSE OF POTASSIUM OF 22 MEQ TWICE DAILY  LAB WORK TODAY; BMET  Your physician recommends that you schedule a follow-up appointment in: [place], [facility]

## 2013-06-28 ENCOUNTER — Telehealth: Payer: Self-pay | Admitting: *Deleted

## 2013-06-28 ENCOUNTER — Other Ambulatory Visit (INDEPENDENT_AMBULATORY_CARE_PROVIDER_SITE_OTHER): Payer: No Typology Code available for payment source

## 2013-06-28 DIAGNOSIS — I10 Essential (primary) hypertension: Secondary | ICD-10-CM

## 2013-06-28 DIAGNOSIS — I509 Heart failure, unspecified: Secondary | ICD-10-CM

## 2013-06-28 LAB — BASIC METABOLIC PANEL
BUN: 16 mg/dL (ref 6–23)
CHLORIDE: 103 meq/L (ref 96–112)
CO2: 29 meq/L (ref 19–32)
Calcium: 9.4 mg/dL (ref 8.4–10.5)
Creatinine, Ser: 0.5 mg/dL (ref 0.4–1.2)
GFR: 154.26 mL/min (ref >60.00)
GLUCOSE: 100 mg/dL — AB (ref 70–99)
POTASSIUM: 3.3 meq/L — AB (ref 3.5–5.1)
Sodium: 140 mEq/L (ref 135–145)

## 2013-06-28 MED ORDER — POTASSIUM CHLORIDE CRYS ER 20 MEQ PO TBCR: EXTENDED_RELEASE_TABLET | ORAL | Status: DC

## 2013-06-28 NOTE — Telephone Encounter (Signed)
pt advised per Brynda Rim. PA to change K+ to 60 meq AM; 40 meq PM. Pt asked for BMET to be changes to 6/17 from 6/16. I went over the instructions with the pt x 2 with verbal understanding from pt.

## 2013-06-28 NOTE — Telephone Encounter (Signed)
s/w pt about her lab results and advised to increase K+ to 100 meq BID. During my conversation pt states she has only been taking the 80 meq daily and not BID as listed on med list. I advised not to make increase yet to 100 meq BID and let me d/w PA and I will cb to advise of recommendations from [place] said ok and thank you.

## 2013-07-06 ENCOUNTER — Other Ambulatory Visit: Payer: No Typology Code available for payment source

## 2013-07-07 ENCOUNTER — Other Ambulatory Visit: Payer: No Typology Code available for payment source

## 2013-07-09 ENCOUNTER — Ambulatory Visit (INDEPENDENT_AMBULATORY_CARE_PROVIDER_SITE_OTHER): Payer: No Typology Code available for payment source | Admitting: *Deleted

## 2013-07-09 DIAGNOSIS — I10 Essential (primary) hypertension: Secondary | ICD-10-CM

## 2013-07-09 DIAGNOSIS — I509 Heart failure, unspecified: Secondary | ICD-10-CM

## 2013-07-09 LAB — BASIC METABOLIC PANEL
BUN: 17 mg/dL (ref 6–23)
CO2: 22 mEq/L (ref 19–32)
Calcium: 9.6 mg/dL (ref 8.4–10.5)
Chloride: 107 mEq/L (ref 96–112)
Creat: 0.58 mg/dL (ref 0.50–1.10)
GLUCOSE: 120 mg/dL — AB (ref 70–99)
Potassium: 4.1 mEq/L (ref 3.5–5.3)
SODIUM: 136 meq/L (ref 135–145)

## 2013-07-12 ENCOUNTER — Ambulatory Visit: Payer: No Typology Code available for payment source | Admitting: Dietician

## 2013-07-12 ENCOUNTER — Other Ambulatory Visit: Payer: No Typology Code available for payment source

## 2013-07-16 ENCOUNTER — Other Ambulatory Visit: Payer: No Typology Code available for payment source

## 2013-07-22 ENCOUNTER — Ambulatory Visit: Payer: No Typology Code available for payment source | Admitting: Physician Assistant

## 2013-08-09 ENCOUNTER — Ambulatory Visit: Payer: No Typology Code available for payment source | Admitting: Pulmonary Disease

## 2013-08-10 ENCOUNTER — Telehealth: Payer: Self-pay | Admitting: Pulmonary Disease

## 2013-08-10 NOTE — Telephone Encounter (Signed)
I received a surgical clearance for the pt. She had an appt on 08-09-13 but cancelled this. I have LMTCBx1 to get pt rescheduled for this. [place] Bing, CMA

## 2013-08-12 ENCOUNTER — Ambulatory Visit: Payer: No Typology Code available for payment source | Admitting: Physician Assistant

## 2013-08-24 ENCOUNTER — Telehealth: Payer: Self-pay | Admitting: Internal Medicine

## 2013-08-24 NOTE — Telephone Encounter (Signed)
Pt states that she is still having problems with abdominal pain. Pt requests to be seen 09/02/13. Pt scheduled to see Amy Esterwood PA that day at 2pm. Pt aware of appt.

## 2013-08-26 NOTE — Telephone Encounter (Signed)
Pt returned call- [phone]

## 2013-08-26 NOTE — Telephone Encounter (Signed)
LMTCB

## 2013-08-26 NOTE — Telephone Encounter (Signed)
Patient made appt on 9/3 with [facility].

## 2013-08-26 NOTE — Telephone Encounter (Signed)
LMTCBx2. Kassey Laforest, CMA  

## 2013-08-26 NOTE — Telephone Encounter (Signed)
°  Pt returned call- [phone]

## 2013-08-26 NOTE — Telephone Encounter (Signed)
Called and left detailed msg for pt asking her to call back and schedule ov with [facility] if surgical clearance still needed

## 2013-09-02 ENCOUNTER — Ambulatory Visit: Payer: No Typology Code available for payment source | Admitting: Physician Assistant

## 2013-09-17 ENCOUNTER — Ambulatory Visit (INDEPENDENT_AMBULATORY_CARE_PROVIDER_SITE_OTHER): Payer: No Typology Code available for payment source | Admitting: Internal Medicine

## 2013-09-17 ENCOUNTER — Encounter: Payer: Self-pay | Admitting: Internal Medicine

## 2013-09-17 VITALS — BP 140/76 | HR 61 | Temp 98.8°F | Ht 62.5 in | Wt 303.8 lb

## 2013-09-17 DIAGNOSIS — R05 Cough: Secondary | ICD-10-CM

## 2013-09-17 DIAGNOSIS — R059 Cough, unspecified: Secondary | ICD-10-CM

## 2013-09-17 MED ORDER — TRAMADOL HCL 50 MG PO TABS: ORAL_TABLET | ORAL | Status: DC

## 2013-09-17 MED ORDER — PANTOPRAZOLE SODIUM 40 MG PO TBEC: 40.0000 mg | DELAYED_RELEASE_TABLET | Freq: Every day | ORAL | Status: DC

## 2013-09-17 MED ORDER — FAMOTIDINE 20 MG PO TABS
ORAL_TABLET | ORAL | Status: DC
Start: 2013-09-17 — End: 2013-12-28

## 2013-09-17 MED ORDER — PREDNISONE 10 MG PO TABS: ORAL_TABLET | ORAL | Status: DC

## 2013-09-17 MED ORDER — AMOXICILLIN-POT CLAVULANATE 875-125 MG PO TABS: 1.0000 | ORAL_TABLET | Freq: Two times a day (BID) | ORAL | Status: DC

## 2013-09-17 NOTE — Patient Instructions (Addendum)
Augmentin 875 mg twice daily x 10 days  Prednisone 10 mg take  4 each am x 2 days,   2 each am x 2 days,  1 each am x 2 days and stop   Pantoprazole (protonix) 40 mg   Take 30-60 min before first meal of the day and Pepcid 20 mg one bedtime until return to office - this is the best way to tell whether stomach acid is contributing to your problem.    Take mucinex dm 2  every 12 hours and supplement if needed with  tramadol 50 mg up to 2 every 4 hours to suppress the urge to cough. Swallowing water or using ice chips/non mint and menthol containing candies (such as lifesavers or sugarless jolly ranchers) are also effective.  You should rest your voice and avoid activities that you know make you cough.  Once you have eliminated the cough for 3 straight days try reducing the tramadol first,  then the mucinex dm GERD (REFLUX)  is an extremely common cause of respiratory symptoms, many times with no significant heartburn at all.    It can be treated with medication, but also with lifestyle changes including avoidance of late meals, excessive alcohol, smoking cessation, and avoid fatty foods, chocolate, peppermint, colas, red wine, and acidic juices such as orange juice.  NO MINT OR MENTHOL PRODUCTS SO NO COUGH DROPS  USE SUGARLESS CANDY INSTEAD ( sugarless jolley ranchers or Stover's)  NO OIL BASED VITAMINS - use powdered substitutes.   Symbicort 80 via spacer 80 Take 2 puffs first thing in am and then another 2 puffs about 12 hours later via spacer sample    Please schedule a follow up office visit in 2 weeks, sooner if needed with Dr Gwenette Greet to regroup re longterm treatment options to prevent this from recurring

## 2013-09-17 NOTE — Assessment & Plan Note (Addendum)
Sinus Ct 03/18/12: Evidence of chronic sphenoid sinusitis with small fluid levels in the right sphenoid and right maxillary sinuses. CT chest 11/2012:  No bronchiectasis, fibrosis, or other pathological finding.  Arlyce Harman 02/2012: normal +h/o asthma as a child.  +response to prednisone. Started qvar trial 11/2012.> not compliant  09/17/2013 p extensive coaching HFA effectiveness  < 10%   Most likely this is  Classic Upper airway cough syndrome, so named because it's frequently impossible to sort out how much is  CR/sinusitis with freq throat clearing (which can be related to primary GERD)   vs  causing  secondary (" extra esophageal")  GERD from wide swings in gastric pressure that occur with throat clearing, often  promoting self use of mint and menthol lozenges that reduce the lower esophageal sphincter tone and exacerbate the problem further in a cyclical fashion.   These are the same pts (now being labeled as having "irritable larynx syndrome" by some cough centers) who not infrequently have a history of having failed to tolerate ace inhibitors,  dry powder inhalers(or even hfa ICS like symbicort, esp in the higher dose ) or biphosphonates or report having atypical reflux symptoms that don't respond to standard doses of PPI , and are easily confused as having aecopd or asthma flares by even experienced allergists/ pulmonologists.   rec rechallange with  1) intentense gerd rx and continue as maint for now if she'll take it 2) just use symbicort 80 2bid via spacer with low threshold to stop it 3) empirically rx sinus dz with 10 d of augmentin and low threshold to repeat sinus ct  4) control cyclical cough with mucinex dm and tramadol     Each maintenance medication was reviewed in detail including most importantly the difference between maintenance and as needed and under what circumstances the prns are to be used.  Please see instructions for details which were reviewed in writing and the patient  given a copy.

## 2013-09-17 NOTE — Progress Notes (Signed)
Subjective:    Patient ID: [name], female    DOB: [birth year]   MRN: [id number]  Brief patient profile:  27 yobf never smoker  with known hx of Chronic cough, sinusitis and qestion RAD    History of Present Illness  06/10/2013 ER follow up  Was seen in ER on 5/15 for bronchitis , tx w/ Zpack and pred taper. CXR w/ no sign of acute changes, noted diffuse interstitial markings. Says got only minimally improved.  Complains of prod cough with green mucus, wheezing, dyspnea, tightness.  finished zpak and pred pak yesterday.  would like to discuss beginning nebs, says RT in ER said she should be on this at home.  Was started on QVAR last ov but not taking .   rec Augmentin 875mg  Twice daily  For 7 days  Mucinex DM Twice daily  As needed  Cough/congestion.  Fluids and rest   Begin Symbicort 80/4.41mcg 2 puffs Twice daily  - rinse after use.      09/17/2013  Acute extended  ov/Somaly Marteney re: asthma vs uacs Chief Complaint  Patient presents with  . Acute Visit    Pt c/o increased SOB, wheezing and prod cough with large amounts of white sputum x 1 wk.   Sinus problems by Lucia Gaskins x 5 years and problems with cough/wheezing x 3 years. Cough is worse p lie down and try to put on cpap  Much worse x one week but was much better p last ov and when I saw her in 10/2012 and rx as rhinitis/ gerd, not asthma. She has been "maintaining " on an empty symbicort and qvar via spacer. Really only sob at rest when coughing   No obvious day to day or daytime variabilty or assoc  cp or chest tightness, subjective wheeze overt sinus or hb symptoms. No unusual exp hx or h/o childhood pna/ asthma or knowledge of premature birth.   Also denies any obvious fluctuation of symptoms with weather or environmental changes or other aggravating or alleviating factors except as outlined above   Current Medications, Allergies, Complete Past Medical History, Past Surgical History, Family History, and Social History were  reviewed in [employer] record.  ROS  The following are not active complaints unless bolded sore throat, dysphagia, dental problems, itching, sneezing,  nasal congestion or excess/ purulent secretions, ear ache,   fever, chills, sweats, unintended wt loss, pleuritic or exertional cp, hemoptysis,  orthopnea pnd or leg swelling, presyncope, palpitations, heartburn, abdominal pain, anorexia, nausea, vomiting, diarrhea  or change in bowel or urinary habits, change in stools or urine, dysuria,hematuria,  rash, arthralgias, visual complaints, headache, numbness weakness or ataxia or problems with walking or coordination,  change in mood/affect or memory.            Objective:   Physical Exam  GEN: A/Ox3; pleasant , NAD, obese female nad until starts coughing fit to point of gagging.  Wt Readings from Last 3 Encounters:  09/17/13 303 lb 12.8 oz (137.803 kg)  06/25/13 307 lb (139.254 kg)  06/10/13 313 lb 3.2 oz (142.067 kg)      HEENT:  [place]/AT,  EACs-clear, TMs-wnl, NOSE-clear, THROAT-clear, no lesions, no postnasal drip or exudate noted.   NECK:  Supple w/ fair ROM; no JVD; normal carotid impulses w/o bruits; no thyromegaly or nodules palpated; no lymphadenopathy.  RESP  Diminished BS in bases, w/o, wheezes/ rales/ or rhonchi.no accessory muscle use, no dullness to percussion  CARD:  RRR, no m/r/g  ,  tr peripheral edema, pulses intact, no cyanosis or clubbing.  GI:   Soft & nt; nml bowel sounds; no organomegaly or masses detected.  Musco: Warm bil, no deformities or joint swelling noted.   Neuro: alert, no focal deficits noted.    Skin: Warm, no lesions or rashes         Assessment & Plan:

## 2013-09-20 ENCOUNTER — Ambulatory Visit: Payer: No Typology Code available for payment source | Admitting: Physician Assistant

## 2013-09-23 ENCOUNTER — Ambulatory Visit: Payer: No Typology Code available for payment source | Admitting: Pulmonary Disease

## 2013-10-07 ENCOUNTER — Ambulatory Visit: Payer: No Typology Code available for payment source | Admitting: Pulmonary Disease

## 2013-10-18 ENCOUNTER — Other Ambulatory Visit: Payer: Self-pay | Admitting: *Deleted

## 2013-10-18 MED ORDER — POTASSIUM CHLORIDE CRYS ER 20 MEQ PO TBCR: EXTENDED_RELEASE_TABLET | ORAL | Status: DC

## 2013-11-02 ENCOUNTER — Other Ambulatory Visit: Payer: Self-pay | Admitting: *Deleted

## 2013-11-02 ENCOUNTER — Telehealth: Payer: Self-pay

## 2013-11-02 DIAGNOSIS — I10 Essential (primary) hypertension: Secondary | ICD-10-CM

## 2013-11-02 MED ORDER — LOSARTAN POTASSIUM 50 MG PO TABS: 50.0000 mg | ORAL_TABLET | Freq: Every day | ORAL | Status: DC

## 2013-11-02 NOTE — Telephone Encounter (Signed)
Patient call for a refill on her losartan Mindy filled it for 30 days because the patient would cancel her appts and had a lot of no shows. The patient stated that [facility] told [facility] that it had to be a 90 day RX I called [facility] and the Pharm -D told me that it will pay for 30 days. I explain to the patient that [facility] told me that it would pay for 30 days and that she needed to keep her appointment. Do to checking her blood and because Scott changed some of her meds on her last visit

## 2013-11-23 ENCOUNTER — Ambulatory Visit: Payer: No Typology Code available for payment source | Admitting: Physician Assistant

## 2013-12-02 ENCOUNTER — Other Ambulatory Visit: Payer: Self-pay | Admitting: Family

## 2013-12-02 ENCOUNTER — Telehealth: Payer: Self-pay | Admitting: Pulmonary Disease

## 2013-12-02 DIAGNOSIS — J329 Chronic sinusitis, unspecified: Secondary | ICD-10-CM

## 2013-12-02 NOTE — Telephone Encounter (Signed)
Mindy, see if we can get this pt into the office for an OV.   ?next week.

## 2013-12-02 NOTE — Telephone Encounter (Signed)
Called pt. appt scheduled to see [facility] 11/16 at 4:30. Nothing further needed

## 2013-12-06 ENCOUNTER — Ambulatory Visit
Admission: RE | Admit: 2013-12-06 | Discharge: 2013-12-06 | Disposition: A | Discharge status: Home / Self Care | Payer: No Typology Code available for payment source | Source: Ambulatory Visit | Attending: Family | Admitting: Family

## 2013-12-06 ENCOUNTER — Telehealth: Payer: Self-pay | Admitting: Pulmonary Disease

## 2013-12-06 ENCOUNTER — Ambulatory Visit (INDEPENDENT_AMBULATORY_CARE_PROVIDER_SITE_OTHER): Payer: No Typology Code available for payment source | Admitting: Pulmonary Disease

## 2013-12-06 ENCOUNTER — Encounter: Payer: Self-pay | Admitting: Pulmonary Disease

## 2013-12-06 VITALS — BP 122/64 | HR 110 | Temp 97.8°F | Ht 63.0 in

## 2013-12-06 DIAGNOSIS — R059 Cough, unspecified: Secondary | ICD-10-CM

## 2013-12-06 DIAGNOSIS — J329 Chronic sinusitis, unspecified: Secondary | ICD-10-CM

## 2013-12-06 DIAGNOSIS — J0140 Acute pansinusitis, unspecified: Secondary | ICD-10-CM

## 2013-12-06 DIAGNOSIS — R05 Cough: Secondary | ICD-10-CM

## 2013-12-06 MED ORDER — PREDNISONE 10 MG PO TABS: ORAL_TABLET | ORAL | Status: DC

## 2013-12-06 NOTE — Assessment & Plan Note (Signed)
The patient had a CT of her sinuses today that shows acute on chronic sinusitis. He has scattered air-fluid levels and thickening noted. I really think this is the primary issue driving her cough, and not lower airways disease. She has been started on antibiotics, and will add nasal saline rinses to her regimen.

## 2013-12-06 NOTE — Assessment & Plan Note (Signed)
The patient has had acceleration of her cough, but I truly believe this is coming from her upper airway. Her lungs are totally clear today, and she does have some upper airway pseudo wheezing. Her spirometry shows no airflow obstruction even in the face of ongoing symptoms. Because of this, I have asked her to discontinue all of her inhaled medications, which can often irritate her upper airway further. I am concerned about the possibility of ongoing reflux, and we'll therefore treat her with twice a day proton pump inhibitor until we can sort all of this out. Her CT of her sinuses today shows pansinusitis with acute on chronic changes. 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 I would like to see her back in a short time period to see how she is doing.

## 2013-12-06 NOTE — Progress Notes (Signed)
   Subjective:    Patient ID: [name], female    DOB: Mar 08, 1960, 53 y.o.   MRN: [id number]  HPI The patient comes in today for an acute sick visit. She has known acute and chronic sinusitis, with severe upper airway cough that has been ongoing over the last 6 months. It is felt her issues are coming primarily from her upper airway, though we have not been able to exclude completely the possibility of asthma. I have felt that she primarily has reactive airways disease secondary to her chronic sinus disease. She has recently seen escalation of her cough with ongoing postnasal drip and purulence. She had a CT of her sinuses today that shows acute and chronic sinusitis with air-fluid levels noted. I have also done spirometry today that shows no airflow obstruction.   Review of Systems  Constitutional: Negative for fever and unexpected weight change.  HENT: Positive for congestion. Negative for dental problem, ear pain, nosebleeds, postnasal drip, rhinorrhea, sinus pressure, sneezing, sore throat and trouble swallowing.   Eyes: Negative for redness and itching.  Respiratory: Positive for cough, chest tightness, shortness of breath and wheezing.   Cardiovascular: Positive for chest pain. Negative for palpitations and leg swelling.  Gastrointestinal: Negative for nausea and vomiting.  Genitourinary: Negative for dysuria.  Musculoskeletal: Negative for joint swelling.  Skin: Negative for rash.  Neurological: Negative for headaches.  Hematological: Does not bruise/bleed easily.  Psychiatric/Behavioral: Negative for dysphoric mood. The patient is not nervous/anxious.        Objective:   Physical Exam Morbidly obese female in no acute distress Nose without purulence or discharge noted except on the left Neck without lymphadenopathy or thyromegaly Chest totally clear to auscultation, mild upper airway pseudo wheezing Cardiac exam with regular rate and rhythm Lower extremities with mild  edema, no cyanosis Alert and oriented, moves all 4 extremities.       Assessment & Plan:

## 2013-12-06 NOTE — Patient Instructions (Addendum)
Will treat for reflux more aggressively.  Stop pepcid for now, and take nexium 40mg  am and pm before eating.  Finish up your augmentin as prescribed. Will treat with an 8 day course of prednisone to help with your sinus inflammation.  Try using neil med sinus rinses am and pm until you finish your antibiotics. Will stop symbicort since your breathing tests are normal.   Will see you back in 2 weeks to check on things, and we can discuss ENT evaluation further.

## 2013-12-06 NOTE — Telephone Encounter (Signed)
lmomtcb x1 for pt 

## 2013-12-06 NOTE — Telephone Encounter (Signed)
Let pt know that I forgot to give her something for her cough. Ok to call in tramadol 50mg  one every 6 hrs if needed for cough.  #30, no fills.

## 2013-12-07 MED ORDER — TRAMADOL HCL 50 MG PO TABS: 50.0000 mg | ORAL_TABLET | Freq: Four times a day (QID) | ORAL | Status: DC | PRN

## 2013-12-07 NOTE — Telephone Encounter (Signed)
Pt returning call.[name] ° °

## 2013-12-07 NOTE — Telephone Encounter (Signed)
Called spoke with pt. Aware we are calling in tramadol for her. Nothing further needed

## 2013-12-09 ENCOUNTER — Ambulatory Visit: Payer: No Typology Code available for payment source | Admitting: Cardiology

## 2013-12-22 ENCOUNTER — Telehealth: Payer: Self-pay | Admitting: Pulmonary Disease

## 2013-12-22 ENCOUNTER — Ambulatory Visit: Payer: No Typology Code available for payment source | Admitting: Pulmonary Disease

## 2013-12-22 NOTE — Telephone Encounter (Signed)
I think she needs to see ENT, because this is coming from her sinus disease.  Please see if she has seen ENT and who, and call and see if they can get her in for an acute sick visit.  Let them know she has had a recent scan of sinuses.

## 2013-12-22 NOTE — Telephone Encounter (Signed)
Per [place]. This is all related to her sinus disease and she does not need to see him until after she see's ENT. He reports to cancel tomorrow's visit and if she has an ENT doc she needs to see them asap. If not, then we can refer her to ENT. Either way no need for appt with [facility] until she gets evaluated from ENT.  Called pt and LMTCB x1

## 2013-12-22 NOTE — Telephone Encounter (Signed)
Pt ccalled back. She does see ENT and will call them for appt. If she has any problem getting an appt she will call us back. appt cancelled.

## 2013-12-22 NOTE — Telephone Encounter (Signed)
Pt states that she is coughing with thick yellow colored mucus and wheezing. Some chest pain/tightness with cough Denies fever.  Pt reports still using Nexium qd and Tramadol. Completed both Augmentin and Prednisone.   Patient Instructions     Will treat for reflux more aggressively. Stop pepcid for now, and take nexium 40mg  am and pm before eating.  Finish up your augmentin as prescribed. Will treat with an 8 day course of prednisone to help with your sinus inflammation.  Try using neil med sinus rinses am and pm until you finish your antibiotics. Will stop symbicort since your breathing tests are normal.  Will see you back in 2 weeks to check on things, and we can discuss ENT evaluation further.   Allergies  Allergen Reactions  . Doxycycline Nausea And Vomiting  . Latex Hives  . Ciprofloxacin Rash  . Fish Allergy Rash    Only reaction to Mackerel.  No other issues with any type of fish or shellfish currently   . Hydrocortisone Rash  . Neomycin Rash  . Sulfonamide Derivatives Swelling and Rash   Please advise Dr Gwenette Greet. Thanks.

## 2013-12-23 ENCOUNTER — Ambulatory Visit: Payer: No Typology Code available for payment source | Admitting: Pulmonary Disease

## 2013-12-24 ENCOUNTER — Encounter: Payer: Self-pay | Admitting: Pulmonary Disease

## 2013-12-28 ENCOUNTER — Ambulatory Visit (INDEPENDENT_AMBULATORY_CARE_PROVIDER_SITE_OTHER): Payer: No Typology Code available for payment source | Admitting: Physician Assistant

## 2013-12-28 ENCOUNTER — Other Ambulatory Visit: Payer: Self-pay | Admitting: *Deleted

## 2013-12-28 ENCOUNTER — Encounter: Payer: Self-pay | Admitting: Physician Assistant

## 2013-12-28 VITALS — BP 118/70 | HR 120 | Ht 63.0 in | Wt 307.0 lb

## 2013-12-28 DIAGNOSIS — I10 Essential (primary) hypertension: Secondary | ICD-10-CM

## 2013-12-28 DIAGNOSIS — R079 Chest pain, unspecified: Secondary | ICD-10-CM

## 2013-12-28 DIAGNOSIS — R000 Tachycardia, unspecified: Secondary | ICD-10-CM

## 2013-12-28 DIAGNOSIS — I471 Supraventricular tachycardia: Secondary | ICD-10-CM

## 2013-12-28 DIAGNOSIS — R0600 Dyspnea, unspecified: Secondary | ICD-10-CM

## 2013-12-28 DIAGNOSIS — E876 Hypokalemia: Secondary | ICD-10-CM

## 2013-12-28 DIAGNOSIS — I5032 Chronic diastolic (congestive) heart failure: Secondary | ICD-10-CM

## 2013-12-28 MED ORDER — SPIRONOLACTONE 25 MG PO TABS: 25.0000 mg | ORAL_TABLET | Freq: Every day | ORAL | Status: DC

## 2013-12-28 NOTE — Patient Instructions (Signed)
LAB WORK TODAY; BMET, CBC W/DIFF, TSH, BNP AND A STAT D-DIMER  Your physician has requested that you have an echocardiogram. Echocardiography is a painless test that uses sound waves to create images of your heart. It provides your doctor with information about the size and shape of your heart and how well your heart's chambers and valves are working. This procedure takes approximately one hour. There are no restrictions for this procedure.   A chest x-ray AT [facility] [place] takes a picture of the organs and structures inside the chest, including the heart, lungs, and blood vessels. This test can show several things, including, whether the heart is enlarges; whether fluid is building up in the lungs; and whether pacemaker / defibrillator leads are still in place.  Your physician recommends that you schedule a follow-up appointment in: [place], [facility]

## 2013-12-28 NOTE — Progress Notes (Signed)
Cardiology Office Note   Date:  12/28/2013   ID:  [name], DOB [birth year], MRN [id number]  PCP:  Smothers, Andree Elk, NP  Cardiologist:  Dr. Minus Breeding     History of Present Illness: [name] is a 53 y.o. female with a history of diastolic CHF, HTN, HL, sleep apnea. Cardiac catheterization in 2001 demonstrated no significant CAD. I saw her in 06/2013.  She was somewhat volume overloaded. She had recently recovered from acute bronchitis. Lasix was adjusted. Follow-up labs demonstrated hypokalemia. Spironolactone was added to her medical regimen. She has a history of cough with chronic sinus disease. She has seen pulmonology and has been referred to ENT.    In the office today, the patient is noted to be tachycardic. She can tell that her heart rate is fast. She feels that this has been ongoing for the past 2-3 weeks. She does note that she's had increased coughing over the past couple of months with her sinus disease. She has developed some chest discomfort. She notes this more with deep breaths as well as with lying flat. She did take a train to [place] over Thanksgiving. She does note dyspnea with exertion. This may be somewhat worse. She is NYHA 2-2b. Denies syncope. She does note some lightheadedness at times. She sleeps on 3 pillows chronically. She denies significant LE edema.  Recent Labs: 02/11/2013: ALT 22 04/11/2013: Hemoglobin 12.7; Pro B Natriuretic peptide (BNP) 25.6 07/09/2013: BUN 17; Creatinine 0.58; Potassium 4.1; Sodium 136     Wt Readings from Last 3 Encounters:  12/28/13 307 lb (139.254 kg)  09/17/13 303 lb 12.8 oz (137.803 kg)  06/25/13 307 lb (139.254 kg)     Past Medical History  Diagnosis Date  . Common migraine   . Morbid obesity   . Hyperlipidemia   . OSA (obstructive sleep apnea)     CPAP dependent  . Carpal tunnel syndrome   . Hypertension   . Allergic rhinitis   . CAD (coronary artery disease)     LHC (11/29/1999):  EF 60%; no  significant CAD.  [name] Chronic diastolic CHF (congestive heart failure)     a. Echo (01/21/11):  Vigorous LVF, EF 65-70%, no RWMA, Gr 2 DD.     Current Outpatient Prescriptions  Medication Sig Dispense Refill  . amLODipine (NORVASC) 5 MG tablet Take 1 tablet (5 mg total) by mouth daily. 90 tablet 0  . dextromethorphan-guaiFENesin (MUCINEX DM) 30-600 MG per 12 hr tablet Take 1 tablet by mouth every 4 (four) hours.    [name] esomeprazole (NEXIUM) 40 MG capsule Take 40 mg by mouth daily.    . fluticasone (FLONASE) 50 MCG/ACT nasal spray Place 1 spray into both nostrils daily.    . furosemide (LASIX) 40 MG tablet Take 2 tablets (80 mg total) by mouth daily. For swelling or fluid retention. 60 tablet 11  . levofloxacin (LEVAQUIN) 750 MG tablet Take 750 mg by mouth daily. Take for 5 days ONLY    . losartan (COZAAR) 50 MG tablet Take 1 tablet (50 mg total) by mouth daily. 30 tablet 0  . metroNIDAZOLE (FLAGYL) 500 MG tablet Take 500 mg by mouth 2 (two) times daily.     . potassium chloride SA (K-DUR,KLOR-CON) 20 MEQ tablet Take 60 meq in the AM; 40 meq in the PM 150 tablet 0  . sodium chloride (OCEAN) 0.65 % SOLN nasal spray Place 1 spray into both nostrils 2 (two) times daily.     [name]  spironolactone (ALDACTONE) 25 MG tablet Take 1 tablet (25 mg total) by mouth daily. 30 tablet 11  . traMADol (ULTRAM) 50 MG tablet Take 1 tablet (50 mg total) by mouth every 6 (six) hours as needed (for cough). 30 tablet 0   No current facility-administered medications for this visit.     Allergies:   Doxycycline; Latex; Ciprofloxacin; Fish allergy; Hydrocortisone; Neomycin; and Sulfonamide derivatives   Social History:  The patient  reports that she has never smoked. She has never used smokeless tobacco. She reports that she does not drink alcohol or use illicit drugs.   Family History:  The patient's family history includes Asthma in her sister; Breast cancer in an other family member; Coronary artery disease in an other  family member; Heart attack in her mother; Heart disease in her mother; Hypertension in her mother. There is no history of Stroke.    ROS:  Please see the history of present illness.   She notes occasional headaches.   All other systems reviewed and negative.    PHYSICAL EXAM: VS:  BP 118/70 mmHg  Pulse 120  Ht 5\' 3"  (1.6 m)  Wt 307 lb (139.254 kg)  BMI 54.40 kg/m2  SpO2 97% Well nourished, well developed, in no acute distress HEENT: normal Neck: no JVD Cardiac:  normal S1, S2;  RRR; no murmur   Chest: Somewhat tender to palpation Lungs:   clear to auscultation bilaterally, no wheezing, rhonchi or rales Abd: soft, nontender, no hepatomegaly Ext: no edema Skin: warm and dry Neuro:  CNs 2-12 intact, no focal abnormalities noted  EKG:  Sinus tachycardia, HR 109, rightward axis, nonspecific ST-T wave changes      ASSESSMENT AND PLAN:  1.  Chest pain:  Chest discomfort is somewhat atypical. I suspect it is all related to her increased coughing. She is tachycardic. She did take a trip to [place] recently.    -  Obtain echocardiogram as her symptoms are made worse with lying flat.    -  Obtain chest x-ray.    -  Obtain d-dimer. If elevated proceed with chest CTA. 2.  Sinus tachycardia:  I suspect that this is all related to her prolonged sinus illness.    -  Check BMET, CBC, TSH, d-dimer.    -  Obtain echocardiogram. 3.  Chronic diastolic CHF (congestive heart failure):  Volume appears stable. I will obtain a BNP today. Adjust Lasix if necessary. Follow-up echocardiogram, 4.  Essential hypertension:  Controlled. 5.  Hypokalemia:  Obtain follow-up BMET   Disposition:   FU with me in 2 weeks.   Signed, Versie Starks, MHS 12/28/2013 12:13 PM    [facility] [address], [place], [place]  [address] Phone: [phone]; Fax: [phone]

## 2013-12-30 ENCOUNTER — Telehealth: Payer: Self-pay | Admitting: *Deleted

## 2013-12-30 ENCOUNTER — Ambulatory Visit
Admission: RE | Admit: 2013-12-30 | Discharge: 2013-12-30 | Disposition: A | Discharge status: Home / Self Care | Payer: No Typology Code available for payment source | Source: Ambulatory Visit | Attending: Physician Assistant | Admitting: Physician Assistant

## 2013-12-30 ENCOUNTER — Other Ambulatory Visit: Payer: Self-pay | Admitting: *Deleted

## 2013-12-30 ENCOUNTER — Other Ambulatory Visit (INDEPENDENT_AMBULATORY_CARE_PROVIDER_SITE_OTHER): Payer: No Typology Code available for payment source | Admitting: *Deleted

## 2013-12-30 DIAGNOSIS — R0600 Dyspnea, unspecified: Secondary | ICD-10-CM

## 2013-12-30 DIAGNOSIS — I5032 Chronic diastolic (congestive) heart failure: Secondary | ICD-10-CM

## 2013-12-30 DIAGNOSIS — I10 Essential (primary) hypertension: Secondary | ICD-10-CM

## 2013-12-30 DIAGNOSIS — R0602 Shortness of breath: Secondary | ICD-10-CM

## 2013-12-30 DIAGNOSIS — R079 Chest pain, unspecified: Secondary | ICD-10-CM

## 2013-12-30 LAB — BASIC METABOLIC PANEL
BUN: 16 mg/dL (ref 6–23)
CALCIUM: 9.1 mg/dL (ref 8.4–10.5)
CHLORIDE: 98 meq/L (ref 96–112)
CO2: 29 mEq/L (ref 19–32)
Creatinine, Ser: 0.7 mg/dL (ref 0.4–1.2)
GFR: 119.53 mL/min (ref >60.00)
GLUCOSE: 127 mg/dL — AB (ref 70–99)
Potassium: 2.5 mEq/L — CL (ref 3.5–5.1)
SODIUM: 135 meq/L (ref 135–145)

## 2013-12-30 MED ORDER — POTASSIUM CHLORIDE CRYS ER 20 MEQ PO TBCR: EXTENDED_RELEASE_TABLET | ORAL | Status: DC

## 2013-12-30 MED ORDER — LOSARTAN POTASSIUM 50 MG PO TABS: 50.0000 mg | ORAL_TABLET | Freq: Every day | ORAL | Status: DC

## 2013-12-30 NOTE — Telephone Encounter (Signed)
Pt notified about all lab results and med changes. Pt aware to have labs 12/11 @ [facility] on [address]. per [employer]. PA, CBC w/diff, UA, UA Culture, Ferritin, Sttol card, TIBC, CXR. Hold lisinopril, stop lasix, eat 1 banana daily. Pt agreeable to Plan of Care. Pt denies any fever's or chills or dysuria. Pt states she is on prednisone but could not remember the dose. Pt coming in to see Brynda Rim. PA 12/16 12:10, cath cancelled per Brynda Rim. PA, pt aware.

## 2013-12-30 NOTE — Telephone Encounter (Signed)
Patient would like to speak with you regarding her recent office visit. She asked that I send you a message to call her. She can be reached at [phone]. Please advise. Thanks, MI

## 2013-12-31 ENCOUNTER — Telehealth: Payer: Self-pay | Admitting: Physician Assistant

## 2013-12-31 ENCOUNTER — Emergency Department (HOSPITAL_COMMUNITY)
Admission: EM | Admit: 2013-12-31 | Discharge: 2013-12-31 | Discharge status: Absent without leave | Payer: No Typology Code available for payment source

## 2013-12-31 ENCOUNTER — Ambulatory Visit (HOSPITAL_COMMUNITY)
Admission: RE | Admit: 2013-12-31 | Discharge: 2013-12-31 | Disposition: A | Discharge status: Home / Self Care | Payer: No Typology Code available for payment source | Source: Ambulatory Visit | Attending: Physician Assistant | Admitting: Physician Assistant

## 2013-12-31 ENCOUNTER — Telehealth: Payer: Self-pay | Admitting: *Deleted

## 2013-12-31 ENCOUNTER — Other Ambulatory Visit (INDEPENDENT_AMBULATORY_CARE_PROVIDER_SITE_OTHER): Payer: No Typology Code available for payment source

## 2013-12-31 ENCOUNTER — Other Ambulatory Visit: Payer: Self-pay | Admitting: Physician Assistant

## 2013-12-31 DIAGNOSIS — R7989 Other specified abnormal findings of blood chemistry: Secondary | ICD-10-CM

## 2013-12-31 DIAGNOSIS — I10 Essential (primary) hypertension: Secondary | ICD-10-CM

## 2013-12-31 DIAGNOSIS — R0602 Shortness of breath: Secondary | ICD-10-CM

## 2013-12-31 DIAGNOSIS — K760 Fatty (change of) liver, not elsewhere classified: Secondary | ICD-10-CM | POA: Insufficient documentation

## 2013-12-31 DIAGNOSIS — R899 Unspecified abnormal finding in specimens from other organs, systems and tissues: Secondary | ICD-10-CM

## 2013-12-31 LAB — CBC WITH DIFFERENTIAL/PLATELET
BASOS PCT: 0.4 % (ref 0.0–3.0)
Basophils Absolute: 0 10*3/uL (ref 0.0–0.1)
Eosinophils Absolute: 0.5 10*3/uL (ref 0.0–0.7)
Eosinophils Relative: 5.2 % — ABNORMAL HIGH (ref 0.0–5.0)
HCT: 35.6 % — ABNORMAL LOW (ref 36.0–46.0)
HEMOGLOBIN: 11.9 g/dL — AB (ref 12.0–15.0)
LYMPHS PCT: 29.1 % (ref 12.0–46.0)
Lymphs Abs: 2.7 10*3/uL (ref 0.7–4.0)
MCHC: 33.3 g/dL (ref 30.0–36.0)
MCV: 90.4 fl (ref 78.0–100.0)
Monocytes Absolute: 0.8 10*3/uL (ref 0.1–1.0)
Monocytes Relative: 8.9 % (ref 3.0–12.0)
NEUTROS ABS: 5.2 10*3/uL (ref 1.4–7.7)
Neutrophils Relative %: 56.4 % (ref 43.0–77.0)
Platelets: 254 10*3/uL (ref 150.0–400.0)
RBC: 3.94 Mil/uL (ref 3.87–5.11)
RDW: 15 % (ref 11.5–15.5)
WBC: 9.2 10*3/uL (ref 4.0–10.5)

## 2013-12-31 LAB — BASIC METABOLIC PANEL
BUN: 15 mg/dL (ref 6–23)
CHLORIDE: 101 meq/L (ref 96–112)
CO2: 29 meq/L (ref 19–32)
Calcium: 9.2 mg/dL (ref 8.4–10.5)
Creatinine, Ser: 0.6 mg/dL (ref 0.4–1.2)
GFR: 128.47 mL/min (ref >60.00)
Glucose, Bld: 122 mg/dL — ABNORMAL HIGH (ref 70–99)
POTASSIUM: 3.2 meq/L — AB (ref 3.5–5.1)
SODIUM: 137 meq/L (ref 135–145)

## 2013-12-31 LAB — D-DIMER, QUANTITATIVE: D-Dimer, Quant: 1.02 ug/mL-FEU — ABNORMAL HIGH (ref 0.00–0.48)

## 2013-12-31 LAB — TSH: TSH: 2.18 u[IU]/mL (ref 0.35–4.50)

## 2013-12-31 MED ORDER — IOHEXOL 350 MG/ML SOLN
100.0000 mL | Freq: Once | INTRAVENOUS | Status: AC | PRN
  Administered 2013-12-31: 100 mL via INTRAVENOUS

## 2013-12-31 NOTE — Telephone Encounter (Signed)
D-dimer results reviewed with Kerin Ransom, PA. Pt needs CTAngio of chest to rule out PE today. No openings at [facility] today. Radiology at [facility] do as out patient as we are unable to get pre certified prior to scheduling appt.  Pt will need to go to ED at [facility]. I have placed call to pt and left message to call back. Will continue to try to reach pt

## 2013-12-31 NOTE — Telephone Encounter (Signed)
Spoke with pt and gave her instructions from [place], [place]. I stressed to pt importance of doing CT today but she has no transportation tonight so will go tomorrow morning. She would like to have done at [facility] so will go to [facility] ED tomorrow morning.

## 2013-12-31 NOTE — Telephone Encounter (Signed)
Patient thought someone had called her and was returning their call. [name] had talked to her yesterday. Patient had labs this morning drawn. Doctor has not reviewed labs at this point. Informed patient when labs have been reviewed she will receive a call.

## 2013-12-31 NOTE — Telephone Encounter (Signed)
Patient given lab results, per Richardson Dopp, TSH normal, Creatinine normal, Potassium improved, Hemoglobin slightly low, Hemoglobin does not likely explain increased heart rate. However, I would like for her to follow-up with primary care to investigate her anemia further, Continue K+ 80 mEq twice a day and Check BMET Monday or Tuesday next week. Patient coming to get her BMET on Monday when she comes in for her scheduled ECHO. Patient will call PCP, labs have been routed to her PCP. Patient verbalized understanding of instructions. No other questions at this time.

## 2013-12-31 NOTE — Telephone Encounter (Signed)
S/w pt and K+ called in

## 2013-12-31 NOTE — Telephone Encounter (Signed)
New Msg   Pt returning call and can be reached at [phone].

## 2013-12-31 NOTE — Telephone Encounter (Signed)
New Msg   Pt returning call. May be reached at [phone]

## 2014-01-01 ENCOUNTER — Telehealth: Payer: Self-pay | Admitting: Internal Medicine

## 2014-01-01 ENCOUNTER — Emergency Department (HOSPITAL_COMMUNITY): Payer: No Typology Code available for payment source

## 2014-01-01 ENCOUNTER — Encounter (HOSPITAL_COMMUNITY): Payer: Self-pay | Admitting: *Deleted

## 2014-01-01 ENCOUNTER — Emergency Department (HOSPITAL_COMMUNITY)
Admission: EM | Admit: 2014-01-01 | Discharge: 2014-01-01 | Disposition: A | Discharge status: Home / Self Care | Payer: No Typology Code available for payment source | Attending: Emergency Medicine | Admitting: Emergency Medicine

## 2014-01-01 DIAGNOSIS — Z8709 Personal history of other diseases of the respiratory system: Secondary | ICD-10-CM | POA: Diagnosis not present

## 2014-01-01 DIAGNOSIS — Z9981 Dependence on supplemental oxygen: Secondary | ICD-10-CM | POA: Diagnosis not present

## 2014-01-01 DIAGNOSIS — Z7951 Long term (current) use of inhaled steroids: Secondary | ICD-10-CM | POA: Insufficient documentation

## 2014-01-01 DIAGNOSIS — R109 Unspecified abdominal pain: Secondary | ICD-10-CM | POA: Insufficient documentation

## 2014-01-01 DIAGNOSIS — R079 Chest pain, unspecified: Secondary | ICD-10-CM | POA: Diagnosis present

## 2014-01-01 DIAGNOSIS — G4733 Obstructive sleep apnea (adult) (pediatric): Secondary | ICD-10-CM | POA: Insufficient documentation

## 2014-01-01 DIAGNOSIS — I10 Essential (primary) hypertension: Secondary | ICD-10-CM | POA: Insufficient documentation

## 2014-01-01 DIAGNOSIS — R0789 Other chest pain: Secondary | ICD-10-CM | POA: Insufficient documentation

## 2014-01-01 DIAGNOSIS — I2510 Atherosclerotic heart disease of native coronary artery without angina pectoris: Secondary | ICD-10-CM | POA: Insufficient documentation

## 2014-01-01 DIAGNOSIS — Z792 Long term (current) use of antibiotics: Secondary | ICD-10-CM | POA: Diagnosis not present

## 2014-01-01 DIAGNOSIS — E876 Hypokalemia: Secondary | ICD-10-CM | POA: Insufficient documentation

## 2014-01-01 DIAGNOSIS — Z91040 Latex allergy status: Secondary | ICD-10-CM | POA: Diagnosis not present

## 2014-01-01 DIAGNOSIS — I5032 Chronic diastolic (congestive) heart failure: Secondary | ICD-10-CM | POA: Diagnosis not present

## 2014-01-01 DIAGNOSIS — Z79899 Other long term (current) drug therapy: Secondary | ICD-10-CM | POA: Insufficient documentation

## 2014-01-01 LAB — BASIC METABOLIC PANEL
Anion gap: 15 (ref 5–15)
BUN: 13 mg/dL (ref 6–23)
CHLORIDE: 97 meq/L (ref 96–112)
CO2: 27 mEq/L (ref 19–32)
CREATININE: 0.6 mg/dL (ref 0.50–1.10)
Calcium: 10 mg/dL (ref 8.4–10.5)
GFR calc Af Amer: >90 mL/min (ref >90)
GFR calc non Af Amer: >90 mL/min (ref >90)
GLUCOSE: 111 mg/dL — AB (ref 70–99)
Potassium: 3 mEq/L — ABNORMAL LOW (ref 3.7–5.3)
Sodium: 139 mEq/L (ref 137–147)

## 2014-01-01 LAB — CBC
HEMATOCRIT: 39.2 % (ref 36.0–46.0)
Hemoglobin: 13.1 g/dL (ref 12.0–15.0)
MCH: 30.7 pg (ref 26.0–34.0)
MCHC: 33.4 g/dL (ref 30.0–36.0)
MCV: 91.8 fL (ref 78.0–100.0)
Platelets: 253 10*3/uL (ref 150–400)
RBC: 4.27 MIL/uL (ref 3.87–5.11)
RDW: 14.8 % (ref 11.5–15.5)
WBC: 8.9 10*3/uL (ref 4.0–10.5)

## 2014-01-01 MED ORDER — POTASSIUM CHLORIDE CRYS ER 20 MEQ PO TBCR
40.0000 meq | EXTENDED_RELEASE_TABLET | Freq: Once | ORAL | Status: AC
  Administered 2014-01-01: 40 meq via ORAL
  Filled 2014-01-01: qty 2

## 2014-01-01 MED ORDER — POTASSIUM CHLORIDE ER 10 MEQ PO TBCR: 10.0000 meq | EXTENDED_RELEASE_TABLET | Freq: Every day | ORAL | Status: DC

## 2014-01-01 MED ORDER — KETOROLAC TROMETHAMINE 30 MG/ML IJ SOLN
30.0000 mg | Freq: Once | INTRAMUSCULAR | Status: AC
  Administered 2014-01-01: 30 mg via INTRAVENOUS
  Filled 2014-01-01: qty 1

## 2014-01-01 NOTE — Discharge Instructions (Signed)

## 2014-01-01 NOTE — ED Notes (Signed)
MD at bedside. 

## 2014-01-01 NOTE — Telephone Encounter (Signed)
She called because she had undergone CT-PA to evaluate for PE earlier at [facility] but was unaware of results. She still has sharp pleuritic CP worse on deep inspiration. I informed her that the radiology read of the CT was negative for PE. If she had new or worsening symptoms or if she was concerned she should come to the ED for formal evaluation.  Raliegh Ip, MD MPH

## 2014-01-01 NOTE — ED Provider Notes (Signed)
CSN: [id number]     Arrival date & time 01/01/14  0935 History   First MD Initiated Contact with Patient 01/01/14 1111     Chief Complaint  Patient presents with  . Abdominal Pain  . Chest Pain     (Consider location/radiation/quality/duration/timing/severity/associated sxs/prior Treatment) HPI Comments: The patient is a 53 year old female, she has a recent history of a sinus infection which has been treated by her ear nose and throat doctor with Levaquin, Flagyl and a probiotic. She has recently developed right-sided chest pain which was worked up by her doctor, d-dimer was elevated, CT scan was negative for acute pulmonary embolism and the patient was given reassurance. She has now developed several episodes of diarrhea which she describes as watery and was told by her family doctor that she may have a low hemoglobin. She is scheduled for an echocardiogram on Monday. She continues to have a sharp right-sided chest pain which is intermittent, seems to come on with movement, deep breathing and sometimes spontaneously, feels like an electric shock and is very short-lived, less than 1 second in duration. Not associated with fevers nausea vomiting and there is minimal coughing.  Patient is a 53 y.o. female presenting with abdominal pain and chest pain. The history is provided by the patient and medical records.  Abdominal Pain Associated symptoms: chest pain   Chest Pain Associated symptoms: abdominal pain     Past Medical History  Diagnosis Date  . Common migraine   . Morbid obesity   . Hyperlipidemia   . OSA (obstructive sleep apnea)     CPAP dependent  . Carpal tunnel syndrome   . Hypertension   . Allergic rhinitis   . CAD (coronary artery disease)     LHC (11/29/1999):  EF 60%; no significant CAD.  [name] Chronic diastolic CHF (congestive heart failure)     a. Echo (01/21/11):  Vigorous LVF, EF 65-70%, no RWMA, Gr 2 DD.    Past Surgical History  Procedure Laterality Date  . Tubal  ligation    . Tonsillectomy    . Uvulopalatopharyngoplasty    . Cesarean section    . Nasal sinus surgery    . Partial hysterectomy     Family History  Problem Relation Age of Onset  . Coronary artery disease      1st degree relatvie <50  . Breast cancer      aunt- ? paternal or maternal  . Asthma Sister   . Heart disease Mother   . Heart attack Mother   . Hypertension Mother   . Stroke Neg Hx    History  Substance Use Topics  . Smoking status: Never Smoker   . Smokeless tobacco: Never Used  . Alcohol Use: No   OB History    No data available     Review of Systems  Cardiovascular: Positive for chest pain.  Gastrointestinal: Positive for abdominal pain.  All other systems reviewed and are negative.     Allergies  Doxycycline; Latex; Ciprofloxacin; Fish allergy; Hydrocortisone; Neomycin; and Sulfonamide derivatives  Home Medications   Prior to Admission medications   Medication Sig Start Date End Date Taking? Authorizing Provider  dextromethorphan-guaiFENesin (MUCINEX DM) 30-600 MG per 12 hr tablet Take 1 tablet by mouth every 4 (four) hours.   Yes Historical Provider, MD  esomeprazole (NEXIUM) 40 MG capsule Take 40 mg by mouth daily.   Yes Historical Provider, MD  fluticasone (FLONASE) 50 MCG/ACT nasal spray Place 1 spray into both nostrils daily.  Yes Historical Provider, MD  furosemide (LASIX) 40 MG tablet Take 2 tablets (80 mg total) by mouth daily. For swelling or fluid retention. 03/26/13  Yes Scott Joylene Draft, PA-C  lansoprazole (PREVACID) 15 MG capsule Take 15 mg by mouth daily at 12 noon.   Yes Historical Provider, MD  losartan (COZAAR) 50 MG tablet Take 1 tablet (50 mg total) by mouth daily. 12/30/13  Yes Minus Breeding, MD  metroNIDAZOLE (FLAGYL) 500 MG tablet Take 500 mg by mouth 2 (two) times daily.  12/27/13  Yes Historical Provider, MD  potassium chloride SA (K-DUR,KLOR-CON) 20 MEQ tablet Take 80 meq NOW; take 80 meq 3 HOURS LATER 12/30/13  Yes Scott T  Kathlen Mody, PA-C  traMADol (ULTRAM) 50 MG tablet Take 1 tablet (50 mg total) by mouth every 6 (six) hours as needed (for cough). 12/07/13  Yes Kathee Delton, MD  levofloxacin (LEVAQUIN) 750 MG tablet Take 750 mg by mouth daily. Take for 5 days ONLY 12/27/13   Historical Provider, MD  potassium chloride (K-DUR) 10 MEQ tablet Take 1 tablet (10 mEq total) by mouth daily. 01/01/14   Johnna Acosta, MD  spironolactone (ALDACTONE) 25 MG tablet Take 1 tablet (25 mg total) by mouth daily. Patient not taking: Reported on 01/01/2014 12/28/13   Minus Breeding, MD   BP 124/71 mmHg  Pulse 88  Temp(Src) 98.3 F (36.8 C) (Oral)  Resp 20  SpO2 98% Physical Exam  Constitutional: She appears well-developed and well-nourished. No distress.  HENT:  Head: Normocephalic and atraumatic.  Mouth/Throat: Oropharynx is clear and moist. No oropharyngeal exudate.  Eyes: Conjunctivae and EOM are normal. Pupils are equal, round, and reactive to light. Right eye exhibits no discharge. Left eye exhibits no discharge. No scleral icterus.  Neck: Normal range of motion. Neck supple. No JVD present. No thyromegaly present.  Cardiovascular: Normal rate, regular rhythm, normal heart sounds and intact distal pulses.  Exam reveals no gallop and no friction rub.   No murmur heard. Pulmonary/Chest: Effort normal and breath sounds normal. No respiratory distress. She has no wheezes. She has no rales. She exhibits tenderness ( Reproducible right-sided chest tenderness over the lower right ribs, tenderness at the costal margin).  Abdominal: Soft. Bowel sounds are normal. She exhibits no distension and no mass. There is no tenderness.  Morbidly obese, no abdominal tenderness, very soft  Musculoskeletal: Normal range of motion. She exhibits no edema or tenderness.  Lymphadenopathy:    She has no cervical adenopathy.  Neurological: She is alert. Coordination normal.  Skin: Skin is warm and dry. No rash noted. No erythema.  Psychiatric: She  has a normal mood and affect. Her behavior is normal.  Nursing note and vitals reviewed.   ED Course  Procedures (including critical care time) Labs Review Labs Reviewed  BASIC METABOLIC PANEL - Abnormal; Notable for the following:    Potassium 3.0 (*)    Glucose, Bld 111 (*)    All other components within normal limits  CBC    Imaging Review Dg Chest 2 View  01/01/2014   CLINICAL DATA:  Right-sided chest and back pain for 3 days.  Cough.  EXAM: CHEST  2 VIEW  COMPARISON:  12/30/2013  FINDINGS: The heart size and mediastinal contours are within normal limits. Both lungs are clear. The visualized skeletal structures are unremarkable.  IMPRESSION: No active cardiopulmonary disease.   Electronically Signed   By: Earle Gell M.D.   On: 01/01/2014 12:37   Ct Angio Chest W/cm &/or Wo  Cm  12/31/2013   CLINICAL DATA:  53 year old female with shortness of breath and elevated D-dimer. Evaluate for pulmonary embolus  EXAM: CT ANGIOGRAPHY CHEST WITH CONTRAST  TECHNIQUE: Multidetector CT imaging of the chest was performed using the standard protocol during bolus administration of intravenous contrast. Multiplanar CT image reconstructions and MIPs were obtained to evaluate the vascular anatomy.  CONTRAST:  139mL OMNIPAQUE IOHEXOL 350 MG/ML SOLN  COMPARISON:  Chest x-ray obtained yesterday 12/30/2013; most recent prior chest CT 02/09/2013  FINDINGS: Mediastinum: Unremarkable CT appearance of the thyroid gland. No suspicious mediastinal or hilar adenopathy. No soft tissue mediastinal mass. Small hiatal hernia.  Heart/Vascular: Adequate opacification of the pulmonary arteries to the proximal subsegmental level. No central filling defect to suggest acute pulmonary embolus. Conventional 3 vessel aortic arch. No aneurysmal dilatation or evidence of dissection. Borderline cardiomegaly. No pericardial effusion.  Lungs/Pleura: Negative for pleural effusion. The lungs are clear. Subpleural lipomatosis in the inferior  left hemi thorax noted incidentally.  Bones/Soft Tissues: No acute fracture or aggressive appearing lytic or blastic osseous lesion.  Upper Abdomen: Hepatic steatosis. Otherwise, the visualized upper abdomen is unremarkable.  Review of the MIP images confirms the above findings.  IMPRESSION: 1. Negative for pulmonary embolus, pneumonia or other acute cardiopulmonary process. 2. Borderline cardiomegaly. 3. Hepatic steatosis.   Electronically Signed   By: Jacqulynn Cadet M.D.   On: 12/31/2013 20:15     EKG Interpretation   Date/Time:  Saturday January 01 2014 10:35:40 EST Ventricular Rate:  85 PR Interval:  189 QRS Duration: 91 QT Interval:  385 QTC Calculation: 458 R Axis:   99 Text Interpretation:  Sinus rhythm Low voltage with right axis deviation  Anteroseptal infarct, old Confirmed by KNAPP  MD-J, JON ([id number]) on  01/01/2014 10:41:08 AM      MDM   Final diagnoses:  Chest pain  Hypokalemia    The patient has normal vital signs, her EKG is also unremarkable with no signs of ischemia or tachycardia, will check chest x-ray to rule out pneumothorax, will also check CBC to confirm patient reported anemia, she also reports recent hypokalemia. We'll confirm. Otherwise patient appears stable, pain medications ordered as below.  Labs show ongoing hypokalemia, chest x-ray negative, vital signs normal, EKG unremarkable. Patient informed, stable for discharge  Meds given in ED:  Medications  potassium chloride SA (K-DUR,KLOR-CON) CR tablet 40 mEq (not administered)  ketorolac (TORADOL) 30 MG/ML injection 30 mg (30 mg Intravenous Given 01/01/14 1238)    New Prescriptions   POTASSIUM CHLORIDE (K-DUR) 10 MEQ TABLET    Take 1 tablet (10 mEq total) by mouth daily.        Johnna Acosta, MD 01/01/14 [id number]

## 2014-01-01 NOTE — ED Notes (Addendum)
Pt c/o abd pain radiating from naval to bilateral flanks. Sts sometimes it radiates to her chest. Reports shob only when taking a deep breath. Had CT Angio yesterday, neg for PE. Scheduled for echo Monday. Reports pain worse this am 0500, called cardiology, advised to come to ED. Pt also had 4 episodes diarrhea today.

## 2014-01-02 ENCOUNTER — Telehealth: Payer: Self-pay | Admitting: Physician Assistant

## 2014-01-02 NOTE — Telephone Encounter (Signed)
Agree Richardson Dopp, PA-C   01/02/2014 8:47 PM

## 2014-01-03 ENCOUNTER — Other Ambulatory Visit (INDEPENDENT_AMBULATORY_CARE_PROVIDER_SITE_OTHER): Payer: No Typology Code available for payment source | Admitting: *Deleted

## 2014-01-03 ENCOUNTER — Ambulatory Visit (HOSPITAL_COMMUNITY): Payer: No Typology Code available for payment source | Attending: Cardiology | Admitting: Cardiology

## 2014-01-03 ENCOUNTER — Telehealth: Payer: Self-pay | Admitting: *Deleted

## 2014-01-03 ENCOUNTER — Encounter: Payer: Self-pay | Admitting: Physician Assistant

## 2014-01-03 ENCOUNTER — Telehealth: Payer: Self-pay | Admitting: Physician Assistant

## 2014-01-03 DIAGNOSIS — I10 Essential (primary) hypertension: Secondary | ICD-10-CM | POA: Insufficient documentation

## 2014-01-03 DIAGNOSIS — R079 Chest pain, unspecified: Secondary | ICD-10-CM

## 2014-01-03 DIAGNOSIS — I2510 Atherosclerotic heart disease of native coronary artery without angina pectoris: Secondary | ICD-10-CM | POA: Diagnosis not present

## 2014-01-03 DIAGNOSIS — R899 Unspecified abnormal finding in specimens from other organs, systems and tissues: Secondary | ICD-10-CM

## 2014-01-03 DIAGNOSIS — R0600 Dyspnea, unspecified: Secondary | ICD-10-CM

## 2014-01-03 DIAGNOSIS — R000 Tachycardia, unspecified: Secondary | ICD-10-CM | POA: Diagnosis not present

## 2014-01-03 DIAGNOSIS — I509 Heart failure, unspecified: Secondary | ICD-10-CM | POA: Insufficient documentation

## 2014-01-03 DIAGNOSIS — E785 Hyperlipidemia, unspecified: Secondary | ICD-10-CM | POA: Diagnosis not present

## 2014-01-03 LAB — BASIC METABOLIC PANEL
BUN: 15 mg/dL (ref 6–23)
CHLORIDE: 101 meq/L (ref 96–112)
CO2: 29 mEq/L (ref 19–32)
CREATININE: 0.7 mg/dL (ref 0.4–1.2)
Calcium: 9 mg/dL (ref 8.4–10.5)
GFR: 117.47 mL/min (ref >60.00)
Glucose, Bld: 119 mg/dL — ABNORMAL HIGH (ref 70–99)
Potassium: 3.1 mEq/L — ABNORMAL LOW (ref 3.5–5.1)
Sodium: 137 mEq/L (ref 135–145)

## 2014-01-03 MED ORDER — SPIRONOLACTONE 25 MG PO TABS
25.0000 mg | ORAL_TABLET | Freq: Every day | ORAL | Status: DC
Start: 2014-01-03 — End: 2014-01-10

## 2014-01-03 MED ORDER — POTASSIUM CHLORIDE ER 20 MEQ PO TBCR: 100.0000 meq | EXTENDED_RELEASE_TABLET | Freq: Two times a day (BID) | ORAL | Status: DC

## 2014-01-03 NOTE — Telephone Encounter (Signed)
Walk In pt form " Questions about ER Visit this Weekend" gave to Carol/Scott/KM

## 2014-01-03 NOTE — Telephone Encounter (Signed)
Pt notified about echo and lab results with verbal understanding to increase K+ to 100 meq BID, bmet 1 week. Pt needed to rsc appt 12/21 to 12/17 due to will be out of town on 12/21.

## 2014-01-03 NOTE — Telephone Encounter (Signed)
Pt came this AM for an echo she gave a message to the front desk secretary letting her know that she was in the ER this weekend with Muscle spasm of chest wall and wants medication that her GI doctor called in for her and for [place] to check her chart. I called; Pt was made aware that she needs to call her PCP for muscle spasm and her GI doctor for the prescribed medication. Pt's preliminary echo results given . She is aware that her cardiologist have to review the echo results and make recommendations if needed. Pt verbalized understanding.

## 2014-01-03 NOTE — Telephone Encounter (Signed)
Per Nicoletta Ba PA, I can send refill for the Bentyl for the patient.

## 2014-01-03 NOTE — Progress Notes (Signed)
Echo performed. 

## 2014-01-06 ENCOUNTER — Encounter: Payer: No Typology Code available for payment source | Admitting: Physician Assistant

## 2014-01-06 NOTE — Progress Notes (Signed)
This encounter was created in error - please disregard.

## 2014-01-06 NOTE — Progress Notes (Deleted)
Cardiology Office Note   Date:  01/06/2014   ID:  [name], DOB 02/25/1960, MRN [id number]  PCP:  Smothers, Andree Elk, NP  Cardiologist:  Dr. Minus Breeding     History of Present Illness: [name] is a 53 y.o. female with a history of diastolic CHF, HTN, HL, sleep apnea. Cardiac catheterization in 2001 demonstrated no significant CAD. I saw her in 06/2013.  She was somewhat volume overloaded. She had recently recovered from acute bronchitis. Lasix was adjusted. Follow-up labs demonstrated hypokalemia. Spironolactone was added to her medical regimen. She has a history of cough with chronic sinus disease. She has seen pulmonology and has been referred to ENT.    ***In the office today, the patient is noted to be tachycardic. She can tell that her heart rate is fast. She feels that this has been ongoing for the past 2-3 weeks. She does note that she's had increased coughing over the past couple of months with her sinus disease. She has developed some chest discomfort. She notes this more with deep breaths as well as with lying flat. She did take a train to [place] over Thanksgiving. She does note dyspnea with exertion. This may be somewhat worse. She is NYHA 2-2b. Denies syncope. She does note some lightheadedness at times. She sleeps on 3 pillows chronically. She denies significant LE edema.***   Echocardiogram (01/03/14): Study Conclusions  - Left ventricle: The cavity size was normal. Wall thickness was increased in a pattern of moderate LVH. Systolic function was normal. The estimated ejection fraction was in the range of 60% to 65%. Although no diagnostic regional wall motion abnormality was identified, this possibility cannot be completely excluded on the basis of this study. Features are consistent with a pseudonormal left ventricular filling pattern, with concomitant abnormal relaxation and increased filling pressure (grade 2 diastolic dysfunction). - Aortic valve:  Poorly visualized. Probably trileaflet. There was no stenosis. - Mitral valve: Mildly calcified annulus. There was no significant regurgitation. - Left atrium: The atrium was mildly dilated. - Right ventricle: The cavity size was normal. Systolic function was normal. - Pulmonary arteries: No complete TR doppler jet so unable to estimate PA systolic pressure. - Systemic veins: IVC not visualized.  Impressions: - Technically difficult study with poor acoustic windows. Normal LV size with moderate LV hypertrophy. Moderate diastolic dysfunction. EF 60-65%. Normal RV size and systolic function. No significant valvular abnormalities.   Recent Labs: 02/11/2013: ALT 22 04/11/2013: Pro B Natriuretic peptide (BNP) 25.6 12/31/2013: TSH 2.18 01/01/2014: Hemoglobin 13.1 01/03/2014: BUN 15; Creatinine 0.7; Potassium 3.1*; Sodium 137   Radiology: Dg Chest 2 View  01/01/2014    IMPRESSION: No active cardiopulmonary disease.   Electronically Signed   By: Earle Gell M.D.   On: 01/01/2014 12:37   Dg Chest 2 View  12/30/2013   IMPRESSION: No acute cardiopulmonary disease.  Stable mild cardiomegaly.   Electronically Signed   By: Marin Olp M.D.   On: 12/30/2013 08:59   Ct Angio Chest W/cm &/or Wo Cm  12/31/2013 IMPRESSION: 1. Negative for pulmonary embolus, pneumonia or other acute cardiopulmonary process. 2. Borderline cardiomegaly. 3. Hepatic steatosis.   Electronically Signed   By: Jacqulynn Cadet M.D.   On: 12/31/2013 20:15     Wt Readings from Last 3 Encounters:  12/28/13 307 lb (139.254 kg)  09/17/13 303 lb 12.8 oz (137.803 kg)  06/25/13 307 lb (139.254 kg)     Past Medical History  Diagnosis Date  .  Common migraine   . Morbid obesity   . Hyperlipidemia   . OSA (obstructive sleep apnea)     CPAP dependent  . Carpal tunnel syndrome   . Hypertension   . Allergic rhinitis   . CAD (coronary artery disease)     LHC (11/29/1999):  EF 60%; no significant CAD.  [name] Chronic diastolic CHF  (congestive heart failure)     a. Echo (01/21/11):  Vigorous LVF, EF 65-70%, no RWMA, Gr 2 DD. ;  b.  Echo (12/15): Moderate LVH, EF 21-19%, grade 2 diastolic dysfunction, mild LAE, normal RV function    Current Outpatient Prescriptions  Medication Sig Dispense Refill  . dextromethorphan-guaiFENesin (MUCINEX DM) 30-600 MG per 12 hr tablet Take 1 tablet by mouth every 4 (four) hours.    [name] dicyclomine (BENTYL) 10 MG capsule TAKE 1 CAPSULE BY MOUTH 2 TO 3 TIMES DAILY 90 capsule 0  . esomeprazole (NEXIUM) 40 MG capsule Take 40 mg by mouth daily.    . fluticasone (FLONASE) 50 MCG/ACT nasal spray Place 1 spray into both nostrils daily.    . furosemide (LASIX) 40 MG tablet Take 2 tablets (80 mg total) by mouth daily. For swelling or fluid retention. 60 tablet 11  . lansoprazole (PREVACID) 15 MG capsule Take 15 mg by mouth daily at 12 noon.    [name] levofloxacin (LEVAQUIN) 750 MG tablet Take 750 mg by mouth daily. Take for 5 days ONLY    . losartan (COZAAR) 50 MG tablet Take 1 tablet (50 mg total) by mouth daily. 90 tablet 0  . metroNIDAZOLE (FLAGYL) 500 MG tablet Take 500 mg by mouth 2 (two) times daily.     . potassium chloride (K-DUR) 10 MEQ tablet Take 1 tablet (10 mEq total) by mouth daily. 10 tablet 0  . Potassium Chloride ER 20 MEQ TBCR Take 100 mEq by mouth 2 (two) times daily. 280 tablet 11  . spironolactone (ALDACTONE) 25 MG tablet Take 1 tablet (25 mg total) by mouth daily.    . traMADol (ULTRAM) 50 MG tablet Take 1 tablet (50 mg total) by mouth every 6 (six) hours as needed (for cough). 30 tablet 0   No current facility-administered medications for this visit.     Allergies:   Doxycycline; Latex; Ciprofloxacin; Fish allergy; Hydrocortisone; Neomycin; and Sulfonamide derivatives   Social History:  The patient  reports that she has never smoked. She has never used smokeless tobacco. She reports that she does not drink alcohol or use illicit drugs.   Family History:  The patient's family  history includes Asthma in her sister; Breast cancer in an other family member; Coronary artery disease in an other family member; Heart attack in her mother; Heart disease in her mother; Hypertension in her mother. There is no history of Stroke.    ROS:  Please see the history of present illness.  ***  All other systems reviewed and negative.    PHYSICAL EXAM: VS:  There were no vitals taken for this visit. Well nourished, well developed, in no acute distress HEENT: normal Neck: no JVD Cardiac:  normal S1, S2;  RRR; no murmur   Chest: Somewhat tender to palpation Lungs:   clear to auscultation bilaterally, no wheezing, rhonchi or rales Abd: soft, nontender, no hepatomegaly Ext: no edema Skin: warm and dry Neuro:  CNs 2-12 intact, no focal abnormalities noted  EKG:  ***      ASSESSMENT AND PLAN:  1.  Chest pain:  *** 2.  Sinus tachycardia: ***  3.  Chronic diastolic CHF (congestive heart failure):  *** 4.  Essential hypertension:  *** Controlled. 5.  Hypokalemia:  Obtain follow-up BMET***   Disposition:   FU with ***   Signed, Richardson Dopp, PA-C, MHS 01/06/2014 2:32 PM    [facility] [address], [place], [place]  [address] Phone: [phone]; Fax: [phone]

## 2014-01-09 NOTE — Progress Notes (Signed)
Cardiology Office Note   Date:  01/10/2014   ID:  [name], DOB 1960/10/04, MRN [id number]  PCP:  Smothers, Andree Elk, NP  Cardiologist:  Dr. Minus Breeding     History of Present Illness: [name] is a 53 y.o. female with a history of diastolic CHF, HTN, HL, sleep apnea. Cardiac catheterization in 2001 demonstrated no significant CAD.  She has a history of cough with chronic sinus disease. She has seen pulmonology and has been referred to ENT.    I saw her 12/28/13.  She was noted to be tachycardic. She had developed some chest discomfort.  Labs demonstrated significant hypokalemia which was replaced.  DDimer was elevated but CTA of the chest was negative for pulmonary embolism.   Echo demonstrated normal LV and RV function.  She returns for FU.  She is feeling better.  She is now off Levaquin and Flagyl.  She also stopped taking Spironolactone.  She denies chest pain. She does note RUQ pain.  She got a Rx for Bentyl from her GI.  This helps some.  She denies orthopnea, PND. She denies LE edema.  She denies syncope.  She is taking Hydrocodone syrup which is helping her cough.     Echocardiogram (01/03/14): Study Conclusions  - Left ventricle: The cavity size was normal. Wall thickness was increased in a pattern of moderate LVH. Systolic function was normal. The estimated ejection fraction was in the range of 60% to 65%. Although no diagnostic regional  wall motion abnormalitywas identified, this possibility cannot be completely excluded onthe basis of this study. Features are consistent with a pseudonormal left ventricular filling pattern, with concomitant abnormal relaxation and increased filling pressure (grade 2 diastolic dysfunction). - Aortic valve: Poorly visualized. Probably trileaflet. There was no stenosis. - Mitral valve: Mildly calcified annulus. There was no significant regurgitation. - Left atrium: The atrium was mildly dilated. - Right ventricle: The cavity size  was normal. Systolic function was normal. - Pulmonary arteries: No complete TR doppler jet so unable to estimate PA systolic pressure. - Systemic veins: IVC not visualized. - no Pericardial effusion   Recent Labs: 02/11/2013: ALT 22 04/11/2013: Pro B Natriuretic peptide (BNP) 25.6 12/31/2013: TSH 2.18 01/01/2014: Hemoglobin 13.1 01/03/2014: BUN 15; Creatinine 0.7; Potassium 3.1*; Sodium 137  04/11/2013: Troponin I <0.30    Dg Chest 2 View  01/01/2014    IMPRESSION: No active cardiopulmonary disease.   Electronically Signed   By: Earle Gell M.D.   On: 01/01/2014 12:37   Ct Angio Chest W/cm &/or Wo Cm  12/31/2013     IMPRESSION: 1. Negative for pulmonary embolus, pneumonia or other acute cardiopulmonary process. 2. Borderline cardiomegaly. 3. Hepatic steatosis.   Electronically Signed   By: Jacqulynn Cadet M.D.   On: 12/31/2013 20:15       Wt Readings from Last 3 Encounters:  01/10/14 307 lb (139.254 kg)  12/28/13 307 lb (139.254 kg)  09/17/13 303 lb 12.8 oz (137.803 kg)     Past Medical History  Diagnosis Date  . Common migraine   . Morbid obesity   . Hyperlipidemia   . OSA (obstructive sleep apnea)     CPAP dependent  . Carpal tunnel syndrome   . Hypertension   . Allergic rhinitis   . CAD (coronary artery disease)     LHC (11/29/1999):  EF 60%; no significant CAD.  [name] Chronic diastolic CHF (congestive heart failure)     a. Echo (01/21/11):  Vigorous LVF, EF  65-70%, no RWMA, Gr 2 DD. ;  b.  Echo (12/15): Moderate LVH, EF 41-93%, grade 2 diastolic dysfunction, mild LAE, normal RV function    Current Outpatient Prescriptions  Medication Sig Dispense Refill  . chlorpheniramine-HYDROcodone (TUSSIONEX) 10-8 MG/5ML LQCR Take 5 mLs by mouth Nightly.     [name] dextromethorphan-guaiFENesin (MUCINEX DM) 30-600 MG per 12 hr tablet Take 1 tablet by mouth every 4 (four) hours.    [name] dicyclomine (BENTYL) 10 MG capsule TAKE 1 CAPSULE BY MOUTH 2 TO 3 TIMES DAILY 90 capsule 0  . esomeprazole  (NEXIUM) 40 MG capsule Take 40 mg by mouth daily.    . fluticasone (FLONASE) 50 MCG/ACT nasal spray Place 1 spray into both nostrils daily.    . furosemide (LASIX) 40 MG tablet Take 2 tablets (80 mg total) by mouth daily. For swelling or fluid retention. 60 tablet 11  . lansoprazole (PREVACID) 15 MG capsule Take 15 mg by mouth daily at 12 noon.    [name] losartan (COZAAR) 50 MG tablet Take 1 tablet (50 mg total) by mouth daily. 90 tablet 0  . potassium chloride SA (K-DUR,KLOR-CON) 20 MEQ tablet Take 20 mEq by mouth 2 (two) times daily.     [name] spironolactone (ALDACTONE) 25 MG tablet Take 1 tablet (25 mg total) by mouth daily.    . traMADol (ULTRAM) 50 MG tablet Take 1 tablet (50 mg total) by mouth every 6 (six) hours as needed (for cough). 30 tablet 0   No current facility-administered medications for this visit.     Allergies:   Doxycycline; Latex; Ciprofloxacin; Fish allergy; Hydrocortisone; Neomycin; and Sulfonamide derivatives   Social History:  The patient  reports that she has never smoked. She has never used smokeless tobacco. She reports that she does not drink alcohol or use illicit drugs.   Family History:  The patient's family history includes Asthma in her sister; Breast cancer in an other family member; Coronary artery disease in an other family member; Heart attack in her mother; Heart disease in her mother; Hypertension in her mother. There is no history of Stroke.    ROS:  Please see the history of present illness.   No bleeding problems.   All other systems reviewed and negative.    PHYSICAL EXAM: VS:  BP 150/80 mmHg  Pulse 72  Ht 5\' 3"  (1.6 m)  Wt 307 lb (139.254 kg)  BMI 54.40 kg/m2 Well nourished, well developed, in no acute distress HEENT: normal Neck: no JVD Cardiac:  normal S1, S2;  RRR; no murmur   Lungs:   clear to auscultation bilaterally, no wheezing, rhonchi or rales Abd: soft, no hepatomegaly, mild RUQ tenderness to palpation.  Ext: no edema Skin: warm and  dry Neuro:  CNs 2-12 intact, no focal abnormalities noted  EKG:  NSR ,HR 72, rightward axis, low voltage, PRWP, NSSTTW changes, no change since prior tracings (HR improved)   ASSESSMENT AND PLAN:  1.  Chest pain:  Atypical.  Chest CTA neg for PE.  She is really having more RUQ pain than anything else.  I do not think she needs further cardiac workup.  I do think she should FU with GI.  She still has her GB.  She may need evaluation for GB disease contributing to her symptoms.   2.  Sinus tachycardia:  Resolved.  Question if this was related to a combination of her cough and medications she was taking. 3.  Chronic diastolic CHF (congestive heart failure):  Volume stable.  Check BMET today to recheck K+. 4.  Essential hypertension:  No medications yet today.  this is usually well Controlled. 5.  Hypokalemia:  She stopped Spironolactone for unclear reasons.  She will have a repeat BMET today.    Disposition:   FU with me 4 mos and Dr. Minus Breeding 8 mos.    Signed, Versie Starks, MHS 01/10/2014 12:16 PM    [facility] [place], [place], [place]  [address] Phone: [phone]; Fax: [phone]

## 2014-01-10 ENCOUNTER — Ambulatory Visit (INDEPENDENT_AMBULATORY_CARE_PROVIDER_SITE_OTHER): Payer: No Typology Code available for payment source | Admitting: Physician Assistant

## 2014-01-10 ENCOUNTER — Encounter: Payer: Self-pay | Admitting: Physician Assistant

## 2014-01-10 VITALS — BP 150/80 | HR 72 | Ht 63.0 in | Wt 307.0 lb

## 2014-01-10 DIAGNOSIS — I10 Essential (primary) hypertension: Secondary | ICD-10-CM

## 2014-01-10 DIAGNOSIS — R079 Chest pain, unspecified: Secondary | ICD-10-CM

## 2014-01-10 DIAGNOSIS — R000 Tachycardia, unspecified: Secondary | ICD-10-CM

## 2014-01-10 DIAGNOSIS — I471 Supraventricular tachycardia: Secondary | ICD-10-CM

## 2014-01-10 DIAGNOSIS — I5032 Chronic diastolic (congestive) heart failure: Secondary | ICD-10-CM

## 2014-01-10 DIAGNOSIS — E876 Hypokalemia: Secondary | ICD-10-CM

## 2014-01-10 LAB — BASIC METABOLIC PANEL
BUN: 13 mg/dL (ref 6–23)
CO2: 27 mEq/L (ref 19–32)
Calcium: 8.8 mg/dL (ref 8.4–10.5)
Chloride: 106 mEq/L (ref 96–112)
Creatinine, Ser: 0.6 mg/dL (ref 0.4–1.2)
GFR: 128.46 mL/min (ref >60.00)
Glucose, Bld: 84 mg/dL (ref 70–99)
Potassium: 3.7 mEq/L (ref 3.5–5.1)
Sodium: 141 mEq/L (ref 135–145)

## 2014-01-10 NOTE — Patient Instructions (Signed)
LAB WORK TODAY; BMET  PER SCOTT WEAVER, PAC TO FOLLOW UP WITH DR. PERRY ABOUT PAIN ON THE RIGHT SIDE, MAYBE YOU GALLBLADDER  Your physician recommends that you schedule a follow-up appointment in: [facility]  Your physician wants you to follow-up in: [place] DR. HOCHREIN You will receive a reminder letter in the mail two months in advance. If you don't receive a letter, please call our office to schedule the follow-up appointment.

## 2014-01-11 ENCOUNTER — Telehealth: Payer: Self-pay

## 2014-01-11 ENCOUNTER — Other Ambulatory Visit: Payer: Self-pay | Admitting: *Deleted

## 2014-01-11 MED ORDER — POTASSIUM CHLORIDE CRYS ER 20 MEQ PO TBCR: 100.0000 meq | EXTENDED_RELEASE_TABLET | Freq: Two times a day (BID) | ORAL | Status: DC

## 2014-01-11 NOTE — Telephone Encounter (Signed)
SPOKE WITH PATIENT ABOUT LAB RESULTS. PT VERBALIZED UNDERSTANDING THAT RESULTS WERE NORMAL. PT STATED THAT SHE NEEDED MORE POTASSIUM PILLS TO CONTINUE TREATMENT PLAN. NEW SCRIPT SENT TO PHARMACY. PT WILL CONTINUE ON CURRENT TREATMENT PLAN

## 2014-02-01 ENCOUNTER — Ambulatory Visit (INDEPENDENT_AMBULATORY_CARE_PROVIDER_SITE_OTHER): Payer: No Typology Code available for payment source | Admitting: Internal Medicine

## 2014-02-01 ENCOUNTER — Encounter: Payer: Self-pay | Admitting: Internal Medicine

## 2014-02-01 VITALS — BP 150/80 | HR 73 | Ht 63.0 in | Wt 309.0 lb

## 2014-02-01 DIAGNOSIS — K5732 Diverticulitis of large intestine without perforation or abscess without bleeding: Secondary | ICD-10-CM

## 2014-02-01 DIAGNOSIS — K589 Irritable bowel syndrome without diarrhea: Secondary | ICD-10-CM

## 2014-02-01 DIAGNOSIS — R1084 Generalized abdominal pain: Secondary | ICD-10-CM

## 2014-02-01 MED ORDER — RIFAXIMIN 550 MG PO TABS: 550.0000 mg | ORAL_TABLET | Freq: Two times a day (BID) | ORAL | Status: DC

## 2014-02-01 NOTE — Progress Notes (Signed)
HISTORY OF PRESENT ILLNESS:  [name] is a 54 y.o. female with multiple significant medical problems including hypertension, diastolic heart failure, obstructive sleep apnea, hyperlipidemia, and morbid obesity with BMI greater than 50. The patient was last seen in the office February 2015 for possible diverticulitis for which she was treated with Augmentin. However, a few weeks prior to that visit, she had a negative contrast-enhanced CT scan of the abdomen and pelvis. Scattered colonic diverticula noted but no diverticulitis. In any event, she presents today with ongoing complaints since that time. She reports greater than one year history of postprandial abdominal cramping followed by urgency and loose stools. Recently prescribed dicyclomine 10 mg which she takes before meals and notices that this helps. She was told by one of her providers at her problem may be gallbladder as she occasionally notices pain favoring the right side of the abdomen. She denies nausea, vomiting, or weight loss. No hematochezia. Review of outside blood work from December shows normal hemoglobin at 13.1. The patient did undergo complete colonoscopy October 2005. This was normal except for small internal hemorrhoids.  REVIEW OF SYSTEMS:  All non-GI ROS negative except for arthritis, muscle cramps  Past Medical History  Diagnosis Date  . Common migraine   . Morbid obesity   . Hyperlipidemia   . OSA (obstructive sleep apnea)     CPAP dependent  . Carpal tunnel syndrome   . Hypertension   . Allergic rhinitis   . CAD (coronary artery disease)     LHC (11/29/1999):  EF 60%; no significant CAD.  [name] Chronic diastolic CHF (congestive heart failure)     a. Echo (01/21/11):  Vigorous LVF, EF 65-70%, no RWMA, Gr 2 DD. ;  b.  Echo (12/15): Moderate LVH, EF 78-29%, grade 2 diastolic dysfunction, mild LAE, normal RV function  . Diverticulitis     Past Surgical History  Procedure Laterality Date  . Tubal ligation    .  Tonsillectomy    . Uvulopalatopharyngoplasty    . Cesarean section    . Nasal sinus surgery    . Partial hysterectomy      Social History [name]  reports that she has never smoked. She has never used smokeless tobacco. She reports that she does not drink alcohol or use illicit drugs.  family history includes Asthma in her sister; Breast cancer in an other family member; Coronary artery disease in an other family member; Heart attack in her mother; Heart disease in her mother; Hypertension in her mother. There is no history of Stroke.  Allergies  Allergen Reactions  . Doxycycline Nausea And Vomiting  . Latex Hives  . Ciprofloxacin Rash  . Fish Allergy Rash    Only reaction to Mackerel.  No other issues with any type of fish or shellfish currently   . Hydrocortisone Rash  . Neomycin Rash  . Sulfonamide Derivatives Swelling and Rash       PHYSICAL EXAMINATION: Vital signs: BP 150/80 mmHg  Pulse 73  Ht 5\' 3"  (1.6 m)  Wt 309 lb (140.161 kg)  BMI 54.75 kg/m2  SpO2 98% General: Morbidly obese, Well-developed, well-nourished, no acute distress HEENT: Sclerae are anicteric, conjunctiva pink. Oral mucosa intact Lungs: Clear Heart: Regular Abdomen: soft, obese, nontender, nondistended, no obvious ascites, no peritoneal signs, normal bowel sounds. No organomegaly. Extremities: Trace edema bilaterally Psychiatric: alert and oriented x3. Cooperative    ASSESSMENT:  #1. Chronic postprandial abdominal cramping with urgency and loose stools. Picture most consistent with IBS #  2. Known colonic diverticulosis. Query previous bout of diverticulitis (CT negative) #3. Morbid obesity #4. Negative colonoscopy 2005   PLAN:  #1. Schedule abdominal ultrasound to evaluate right-sided pain #2. Continue dicyclomine before meals as needed. Currently taking 10 mg, may take 20 mg if needed. #3. A course of Xifaxan 550 mg by mouth twice a day 2 weeks for diarrhea predominant IBS #4. The  importance of weight loss strongly stressed. #5. Routine GI office follow-up 3 months.

## 2014-02-01 NOTE — Patient Instructions (Signed)
We have sent the following medications to your pharmacy for you to pick up at your convenience:  [place] have been scheduled for an abdominal ultrasound at [facility] (1st floor of hospital) on 02/04/2014 at 9:30am. Please arrive 15 minutes prior to your appointment for registration. Make certain not to have anything to eat or drink 6 hours prior to your appointment. Should you need to reschedule your appointment, please contact radiology at [phone]. This test typically takes about 30 minutes to perform.   Please follow up with Dr. Henrene Pastor in 3 months

## 2014-02-04 ENCOUNTER — Telehealth: Payer: Self-pay

## 2014-02-04 ENCOUNTER — Ambulatory Visit (HOSPITAL_COMMUNITY)
Admission: RE | Admit: 2014-02-04 | Discharge: 2014-02-04 | Disposition: A | Discharge status: Home / Self Care | Payer: No Typology Code available for payment source | Source: Ambulatory Visit | Attending: Internal Medicine | Admitting: Internal Medicine

## 2014-02-04 DIAGNOSIS — R1084 Generalized abdominal pain: Secondary | ICD-10-CM

## 2014-02-04 DIAGNOSIS — K589 Irritable bowel syndrome without diarrhea: Secondary | ICD-10-CM

## 2014-02-04 DIAGNOSIS — K760 Fatty (change of) liver, not elsewhere classified: Secondary | ICD-10-CM | POA: Diagnosis not present

## 2014-02-04 NOTE — Telephone Encounter (Signed)
Pt called back after receiving [place] results over the phone. Pt wants to know if there is a test she can have done to see if her gallbladder is working. Pt states she is still having a lot of pain on her left side. Please advise.

## 2014-02-06 NOTE — Telephone Encounter (Signed)
That test did look at her gallbladder and it was ok.

## 2014-02-07 ENCOUNTER — Other Ambulatory Visit: Payer: Self-pay

## 2014-02-07 ENCOUNTER — Telehealth: Payer: Self-pay | Admitting: Cardiology

## 2014-02-07 DIAGNOSIS — R1084 Generalized abdominal pain: Secondary | ICD-10-CM

## 2014-02-07 NOTE — Telephone Encounter (Signed)
Waiting for response from Dr. Percival Spanish

## 2014-02-07 NOTE — Telephone Encounter (Signed)
Pt scheduled for hida scan at [facility] 02/14/14@9 :30am, pt to arrive there at 9:15am. Pt to be NPO after midnight. Pt aware of appt.

## 2014-02-07 NOTE — Telephone Encounter (Signed)
New problem   Pt want to know status of sx clearance that she stated was faxed over 1.11.16 for knee surgery from [facility]. Please advise pt.

## 2014-02-07 NOTE — Telephone Encounter (Signed)
Pt states she had discussed with cardiologist having a test done that tests the functioning of her gallbladder and wants to know if she can have a test done for this. Please advise.

## 2014-02-07 NOTE — Telephone Encounter (Signed)
My index of suspicion of gallbladder disease is quite low. She is talking about a HIDA scan. Proceed with HIDA scan

## 2014-02-14 ENCOUNTER — Ambulatory Visit (HOSPITAL_COMMUNITY): Payer: No Typology Code available for payment source

## 2014-02-15 NOTE — Telephone Encounter (Signed)
Called no answer LMTCB

## 2014-02-16 NOTE — Telephone Encounter (Signed)
Pt. States knee surgery was moved out to august , pt. Is going to see Richardson Dopp PA before that date and we can then make a decision about surgery clearance, pt stated understanding of instructions

## 2014-02-25 ENCOUNTER — Ambulatory Visit (HOSPITAL_COMMUNITY): Payer: No Typology Code available for payment source

## 2014-03-20 ENCOUNTER — Emergency Department (HOSPITAL_COMMUNITY): Payer: No Typology Code available for payment source

## 2014-03-20 ENCOUNTER — Encounter (HOSPITAL_COMMUNITY): Payer: Self-pay | Admitting: Emergency Medicine

## 2014-03-20 ENCOUNTER — Emergency Department (HOSPITAL_COMMUNITY)
Admission: EM | Admit: 2014-03-20 | Discharge: 2014-03-20 | Disposition: A | Discharge status: Home / Self Care | Payer: No Typology Code available for payment source | Attending: Emergency Medicine | Admitting: Emergency Medicine

## 2014-03-20 DIAGNOSIS — I2510 Atherosclerotic heart disease of native coronary artery without angina pectoris: Secondary | ICD-10-CM | POA: Diagnosis not present

## 2014-03-20 DIAGNOSIS — Z9981 Dependence on supplemental oxygen: Secondary | ICD-10-CM | POA: Diagnosis not present

## 2014-03-20 DIAGNOSIS — J40 Bronchitis, not specified as acute or chronic: Secondary | ICD-10-CM

## 2014-03-20 DIAGNOSIS — I10 Essential (primary) hypertension: Secondary | ICD-10-CM | POA: Insufficient documentation

## 2014-03-20 DIAGNOSIS — I5032 Chronic diastolic (congestive) heart failure: Secondary | ICD-10-CM | POA: Insufficient documentation

## 2014-03-20 DIAGNOSIS — G4733 Obstructive sleep apnea (adult) (pediatric): Secondary | ICD-10-CM | POA: Diagnosis not present

## 2014-03-20 DIAGNOSIS — Z91040 Latex allergy status: Secondary | ICD-10-CM | POA: Insufficient documentation

## 2014-03-20 DIAGNOSIS — Z79899 Other long term (current) drug therapy: Secondary | ICD-10-CM | POA: Insufficient documentation

## 2014-03-20 DIAGNOSIS — Z8719 Personal history of other diseases of the digestive system: Secondary | ICD-10-CM | POA: Diagnosis not present

## 2014-03-20 DIAGNOSIS — R042 Hemoptysis: Secondary | ICD-10-CM | POA: Diagnosis present

## 2014-03-20 MED ORDER — ALBUTEROL SULFATE HFA 108 (90 BASE) MCG/ACT IN AERS: 1.0000 | INHALATION_SPRAY | Freq: Four times a day (QID) | RESPIRATORY_TRACT | Status: DC | PRN

## 2014-03-20 MED ORDER — PREDNISONE 50 MG PO TABS: ORAL_TABLET | ORAL | Status: DC

## 2014-03-20 MED ORDER — IPRATROPIUM-ALBUTEROL 0.5-2.5 (3) MG/3ML IN SOLN
3.0000 mL | Freq: Once | RESPIRATORY_TRACT | Status: AC
  Administered 2014-03-20: 3 mL via RESPIRATORY_TRACT
  Filled 2014-03-20: qty 3

## 2014-03-20 MED ORDER — ALBUTEROL SULFATE HFA 108 (90 BASE) MCG/ACT IN AERS
2.0000 | INHALATION_SPRAY | Freq: Once | RESPIRATORY_TRACT | Status: AC
  Administered 2014-03-20: 2 via RESPIRATORY_TRACT
  Filled 2014-03-20: qty 6.7

## 2014-03-20 MED ORDER — PREDNISONE 20 MG PO TABS
60.0000 mg | ORAL_TABLET | Freq: Once | ORAL | Status: AC
  Administered 2014-03-20: 60 mg via ORAL
  Filled 2014-03-20: qty 3

## 2014-03-20 NOTE — ED Notes (Signed)
Respiratory at bedside.

## 2014-03-20 NOTE — ED Provider Notes (Signed)
CSN: [id number]     Arrival date & time 03/20/14  0706 History   First MD Initiated Contact with Patient 03/20/14 [phone]     Chief Complaint  Patient presents with  . Hemoptysis  . Shortness of Breath     Patient is a 54 y.o. female presenting with cough. The history is provided by the patient.  Cough Cough characteristics:  Productive Severity:  Moderate Onset quality:  Gradual Duration:  8 days Timing:  Intermittent Progression:  Worsening Chronicity:  New Smoker: no   Relieved by:  Nothing Worsened by:  Nothing tried Ineffective treatments: OTC meds. Associated symptoms: shortness of breath   Associated symptoms: no chest pain and no fever   Patient reports 8 days of cough/congestion She reports she has had green sputum This morning she reports she coughed up small amt of blood in her mucous No CP No back pain She reports she "hears wheezing" She reports dyspnea on exertion No new LE edema She denies fever She denies known h/o CHF/CAD/PE  She reports she has seen pulmonology previously but does not use home albuterol  Past Medical History  Diagnosis Date  . Common migraine   . Morbid obesity   . Hyperlipidemia   . OSA (obstructive sleep apnea)     CPAP dependent  . Carpal tunnel syndrome   . Hypertension   . Allergic rhinitis   . CAD (coronary artery disease)     LHC (11/29/1999):  EF 60%; no significant CAD.  [name] Chronic diastolic CHF (congestive heart failure)     a. Echo (01/21/11):  Vigorous LVF, EF 65-70%, no RWMA, Gr 2 DD. ;  b.  Echo (12/15): Moderate LVH, EF 10-27%, grade 2 diastolic dysfunction, mild LAE, normal RV function  . Diverticulitis    Past Surgical History  Procedure Laterality Date  . Tubal ligation    . Tonsillectomy    . Uvulopalatopharyngoplasty    . Cesarean section    . Nasal sinus surgery    . Partial hysterectomy     Family History  Problem Relation Age of Onset  . Coronary artery disease      1st degree relatvie <50  . Breast  cancer      aunt- ? paternal or maternal  . Asthma Sister   . Heart disease Mother   . Heart attack Mother   . Hypertension Mother   . Stroke Neg Hx    History  Substance Use Topics  . Smoking status: Never Smoker   . Smokeless tobacco: Never Used  . Alcohol Use: No   OB History    No data available     Review of Systems  Constitutional: Negative for fever.  HENT: Negative for nosebleeds.   Respiratory: Positive for cough and shortness of breath.   Cardiovascular: Negative for chest pain and leg swelling.  All other systems reviewed and are negative.     Allergies  Doxycycline; Latex; Ciprofloxacin; Fish allergy; Hydrocortisone; Neomycin; and Sulfonamide derivatives  Home Medications   Prior to Admission medications   Medication Sig Start Date End Date Taking? Authorizing Provider  dextromethorphan-guaiFENesin (MUCINEX DM) 30-600 MG per 12 hr tablet Take 1 tablet by mouth 2 (two) times daily.   Yes Historical Provider, MD  dicyclomine (BENTYL) 10 MG capsule TAKE 1 CAPSULE BY MOUTH 2 TO 3 TIMES DAILY 01/03/14  Yes Amy S Esterwood, PA-C  furosemide (LASIX) 40 MG tablet Take 2 tablets (80 mg total) by mouth daily. For swelling or fluid retention. 03/26/13  Yes Liliane Shi, PA-C  losartan (COZAAR) 50 MG tablet Take 1 tablet (50 mg total) by mouth daily. 12/30/13  Yes Minus Breeding, MD  naproxen sodium (ANAPROX) 220 MG tablet Take 220 mg by mouth 2 (two) times daily with a meal.   Yes Historical Provider, MD  potassium chloride SA (K-DUR,KLOR-CON) 20 MEQ tablet Take 5 tablets (100 mEq total) by mouth 2 (two) times daily. 01/11/14  Yes Scott Joylene Draft, PA-C  rifaximin (XIFAXAN) 550 MG TABS tablet Take 1 tablet (550 mg total) by mouth 2 (two) times daily. 02/01/14  Yes Irene Shipper, MD  traMADol (ULTRAM) 50 MG tablet Take 1 tablet (50 mg total) by mouth every 6 (six) hours as needed (for cough). 12/07/13  Yes Kathee Delton, MD   BP 159/82 mmHg  Pulse 86  Temp(Src) 98.4 F  (36.9 C) (Oral)  Resp 20  SpO2 98% Physical Exam CONSTITUTIONAL: Well developed/well nourished, pt is obese HEAD: Normocephalic/atraumatic EYES: EOMI/PERRL ENMT: Mucous membranes moist NECK: supple no meningeal signs SPINE/BACK:entire spine nontender CV: S1/S2 noted, no murmurs/rubs/gallops noted LUNGS: scattered wheeze, no apparent distress ABDOMEN: soft, nontender, no rebound or guarding, bowel sounds noted throughout abdomen GU:no cva tenderness NEURO: Pt is awake/alert/appropriate, moves all extremitiesx4.  No facial droop.   EXTREMITIES: pulses normal/equal, full ROM. No pitting edema noted.  No calf tenderness.  Scattered varicose veins noted SKIN: warm, color normal PSYCH: no abnormalities of mood noted, alert and oriented to situation  ED Course  Procedures   8:07 AM Pt denied known h/o CHF but chart reports h/o diastolic CHF She had negative CT PE study last month, doubt PE at this time CXR pending I have low suspicion for ACS at this time 10:10 AM Pt ambulated without dyspnea/cp She requested nebulizer/prednisone even though only minimal wheeze She is well appearing I have low suspicion for acute PE or decompensated CHF Suspect viral URI (pt had cough/congestion recently) Appropriate for d/c home BP 159/82 mmHg  Pulse 86  Temp(Src) 98.4 F (36.9 C) (Oral)  Resp 20  SpO2 98%  Imaging Review Dg Chest 2 View (if Patient Has Fever And/or Copd)  03/20/2014   CLINICAL DATA:  54 year old female with 2 week history of shortness of breath progressive yesterday.  EXAM: CHEST  2 VIEW  COMPARISON:  Prior chest x-ray 01/01/2014  FINDINGS: Borderline cardiomegaly. Tortuous thoracic aorta. Mild vascular congestion without overt pulmonary edema. No focal airspace consolidation, pleural effusion or pneumothorax. No suspicious pulmonary nodule or mass. No acute osseous abnormality.  IMPRESSION: 1. Borderline cardiomegaly and pulmonary vascular congestion without overt edema. 2.  Stable chest x-ray.   Electronically Signed   By: Jacqulynn Cadet M.D.   On: 03/20/2014 09:07     EKG Interpretation   Date/Time:  Sunday March 20 2014 07:55:59 EST Ventricular Rate:  68 PR Interval:  169 QRS Duration: 99 QT Interval:  422 QTC Calculation: 449 R Axis:   89 Text Interpretation:  Sinus rhythm Low voltage, precordial leads  Anteroseptal infarct, old Nonspecific T abnormalities, lateral leads No  significant change since last tracing Confirmed by Christy Gentles  MD, Guillermo Difrancesco  [phone]) on 03/20/2014 8:02:22 AM     Medications  albuterol (PROVENTIL HFA;VENTOLIN HFA) 108 (90 BASE) MCG/ACT inhaler 2 puff (2 puffs Inhalation Given 03/20/14 0758)  ipratropium-albuterol (DUONEB) 0.5-2.5 (3) MG/3ML nebulizer solution 3 mL (3 mLs Nebulization Given 03/20/14 1005)  predniSONE (DELTASONE) tablet 60 mg (60 mg Oral Given 03/20/14 1002)    MDM   Final diagnoses:  Bronchitis    Nursing notes including past medical history and social history reviewed and considered in documentation xrays/imaging reviewed by myself and considered during evaluation     Sharyon Cable, MD 03/20/14 1013

## 2014-03-20 NOTE — ED Notes (Signed)
MD at bedside. 

## 2014-03-20 NOTE — ED Notes (Signed)
While receiving medication, pt asked "how long after this breathing treatment will I get my discharge papers?"  Pt informed that the MD would need to reassess after the treatment.

## 2014-03-20 NOTE — ED Notes (Signed)
Pt escorted to discharge window. Verbalized understanding discharge instructions. In no acute distress.   

## 2014-03-20 NOTE — ED Notes (Signed)
Patient transported to X-ray 

## 2014-03-20 NOTE — ED Notes (Signed)
Pt reports for the last week she has felt short of breath and this am she cleared her throat and noticed blood in her sputum. Denies fever or asthma. She reports her pulmonologist discontinued her inhaler.

## 2014-03-20 NOTE — ED Notes (Signed)
Prior to providing inhaler, Pt reported that it would "do nothing for me. They always have to do the mask one when I'm upstairs.  My doctor told me to tell everyone that I need the mask one."  After providing inhaler, Pt report "see, it didn't do anything.  When do I get other thing.  Do I have to wait on the doctor?"  This RN informed the Pt that myself and the MD would reassess shortly.  MD notified.    Lung sounds clear and dry cough noted.  Pt reports coughing up "a little blood" this morning.

## 2014-03-21 ENCOUNTER — Telehealth: Payer: Self-pay | Admitting: Pulmonary Disease

## 2014-03-21 MED ORDER — AMOXICILLIN-POT CLAVULANATE 875-125 MG PO TABS: 1.0000 | ORAL_TABLET | Freq: Two times a day (BID) | ORAL | Status: DC

## 2014-03-21 NOTE — Telephone Encounter (Signed)
Let her know that I have reviewed ER visit and cxr.  You cannot diagnosis bronchitis on a cxr, and her xray was actually clear.  More than likely this is all coming from her sinuses again, as it always does.  She does not need inhaler since we have never diagnosed her with asthma, but the prednisone should definitely help with the cough.  I will give her an abx for sinus infection since this has been an ongoing issue for her.  Can try delsym for cough, but let us know if not improving in 48 hrs  Ok to call in augmentin [phone] one bid for 7 days.

## 2014-03-21 NOTE — Telephone Encounter (Signed)
Rx sent to pharmacy.  Patient notified.  Patient will call back if she is not any better.  Nothing further needed.

## 2014-03-21 NOTE — Telephone Encounter (Signed)
Patient says she went to ER yesterday, she was coughing up blood, she was wheezing for 7 days.  CXR showed bronchitis.  Patient says she is on Prednisone taper now.  She needs a rescue inhaler, patient not able to use inhaler properly even with spacer.  Patient needs a cough medication to help her sleep at night.  Patient says she is coughing up green mucus.  She was not given an antibiotic in ER, only Prednisone.  [place], please advise.

## 2014-03-24 ENCOUNTER — Telehealth: Payer: Self-pay | Admitting: Pulmonary Disease

## 2014-03-24 ENCOUNTER — Other Ambulatory Visit: Payer: Self-pay | Admitting: Cardiology

## 2014-03-24 MED ORDER — PREDNISONE 10 MG PO TABS: ORAL_TABLET | ORAL | Status: DC

## 2014-03-24 MED ORDER — FLUCONAZOLE 100 MG PO TABS: 100.0000 mg | ORAL_TABLET | Freq: Every day | ORAL | Status: DC

## 2014-03-24 NOTE — Telephone Encounter (Signed)
Offer prednisone 10 mg   3 daily x 3 days, 2 daily x 3 days, 1 daily x 3 days   # 18           Diflucan 100 mg, # 7, 1 daily

## 2014-03-24 NOTE — Telephone Encounter (Signed)
Rx refill sent to patient pharmacy   

## 2014-03-24 NOTE — Telephone Encounter (Signed)
Spoke with the pt  She states that she has finished her prednisone the ED gave her, still taking the augmentin that [facility] called in  She is overall better, but still has some minimal wheezing and breathing is not quite back to her normal baseline  She is coughing less, but able to produce more sputum, light yellow in color  She c/o vaginal yeast infection from taking abx  She is asking for more prednisone and something for yeast  Allergies  Allergen Reactions  . Doxycycline Nausea And Vomiting  . Latex Hives  . Ciprofloxacin Rash  . Fish Allergy Rash    Only reaction to Mackerel.  No other issues with any type of fish or shellfish currently   . Hydrocortisone Rash  . Neomycin Rash  . Sulfonamide Derivatives Swelling and Rash    Current Outpatient Prescriptions on File Prior to Visit  Medication Sig Dispense Refill  . albuterol (PROVENTIL HFA;VENTOLIN HFA) 108 (90 BASE) MCG/ACT inhaler Inhale 1-2 puffs into the lungs every 6 (six) hours as needed for wheezing or shortness of breath. 1 Inhaler 0  . amoxicillin-clavulanate (AUGMENTIN) 875-125 MG per tablet Take 1 tablet by mouth 2 (two) times daily. 14 tablet 0  . dextromethorphan-guaiFENesin (MUCINEX DM) 30-600 MG per 12 hr tablet Take 1 tablet by mouth 2 (two) times daily.    [name] dicyclomine (BENTYL) 10 MG capsule TAKE 1 CAPSULE BY MOUTH 2 TO 3 TIMES DAILY 90 capsule 0  . furosemide (LASIX) 40 MG tablet Take 2 tablets (80 mg total) by mouth daily. For swelling or fluid retention. 60 tablet 11  . naproxen sodium (ANAPROX) 220 MG tablet Take 220 mg by mouth 2 (two) times daily with a meal.    . potassium chloride SA (K-DUR,KLOR-CON) 20 MEQ tablet Take 5 tablets (100 mEq total) by mouth 2 (two) times daily. 280 tablet 3  . predniSONE (DELTASONE) 50 MG tablet 1 tablet PO QD X4 days 4 tablet 0  . rifaximin (XIFAXAN) 550 MG TABS tablet Take 1 tablet (550 mg total) by mouth 2 (two) times daily. 14 tablet 0  . traMADol (ULTRAM) 50 MG tablet Take 1  tablet (50 mg total) by mouth every 6 (six) hours as needed (for cough). 30 tablet 0   No current facility-administered medications on file prior to visit.    Please advise, thanks!

## 2014-03-24 NOTE — Telephone Encounter (Signed)
Pt informed of Dr Janee Morn recommendations.  Rx sent to pharmacy.

## 2014-04-11 ENCOUNTER — Inpatient Hospital Stay (HOSPITAL_COMMUNITY)
Admission: EM | Admit: 2014-04-11 | Discharge: 2014-04-13 | DRG: 292 | Disposition: A | Discharge status: Home / Self Care | Payer: No Typology Code available for payment source | Attending: Internal Medicine | Admitting: Internal Medicine

## 2014-04-11 ENCOUNTER — Emergency Department (HOSPITAL_COMMUNITY): Payer: No Typology Code available for payment source

## 2014-04-11 ENCOUNTER — Encounter (HOSPITAL_COMMUNITY): Payer: Self-pay | Admitting: Oncology

## 2014-04-11 DIAGNOSIS — T502X5A Adverse effect of carbonic-anhydrase inhibitors, benzothiadiazides and other diuretics, initial encounter: Secondary | ICD-10-CM | POA: Diagnosis present

## 2014-04-11 DIAGNOSIS — I10 Essential (primary) hypertension: Secondary | ICD-10-CM

## 2014-04-11 DIAGNOSIS — E785 Hyperlipidemia, unspecified: Secondary | ICD-10-CM | POA: Diagnosis present

## 2014-04-11 DIAGNOSIS — K588 Other irritable bowel syndrome: Secondary | ICD-10-CM | POA: Diagnosis present

## 2014-04-11 DIAGNOSIS — I509 Heart failure, unspecified: Secondary | ICD-10-CM

## 2014-04-11 DIAGNOSIS — Z6841 Body Mass Index (BMI) 40.0 and over, adult: Secondary | ICD-10-CM | POA: Diagnosis not present

## 2014-04-11 DIAGNOSIS — G4733 Obstructive sleep apnea (adult) (pediatric): Secondary | ICD-10-CM | POA: Diagnosis not present

## 2014-04-11 DIAGNOSIS — M79609 Pain in unspecified limb: Secondary | ICD-10-CM

## 2014-04-11 DIAGNOSIS — E876 Hypokalemia: Secondary | ICD-10-CM | POA: Diagnosis present

## 2014-04-11 DIAGNOSIS — K7581 Nonalcoholic steatohepatitis (NASH): Secondary | ICD-10-CM | POA: Diagnosis present

## 2014-04-11 DIAGNOSIS — R0602 Shortness of breath: Secondary | ICD-10-CM | POA: Diagnosis present

## 2014-04-11 DIAGNOSIS — E662 Morbid (severe) obesity with alveolar hypoventilation: Secondary | ICD-10-CM | POA: Diagnosis present

## 2014-04-11 DIAGNOSIS — I5033 Acute on chronic diastolic (congestive) heart failure: Principal | ICD-10-CM | POA: Diagnosis present

## 2014-04-11 DIAGNOSIS — K5792 Diverticulitis of intestine, part unspecified, without perforation or abscess without bleeding: Secondary | ICD-10-CM | POA: Diagnosis not present

## 2014-04-11 DIAGNOSIS — Z8249 Family history of ischemic heart disease and other diseases of the circulatory system: Secondary | ICD-10-CM

## 2014-04-11 DIAGNOSIS — I5032 Chronic diastolic (congestive) heart failure: Secondary | ICD-10-CM

## 2014-04-11 DIAGNOSIS — Z803 Family history of malignant neoplasm of breast: Secondary | ICD-10-CM | POA: Diagnosis not present

## 2014-04-11 DIAGNOSIS — Z825 Family history of asthma and other chronic lower respiratory diseases: Secondary | ICD-10-CM

## 2014-04-11 DIAGNOSIS — J329 Chronic sinusitis, unspecified: Secondary | ICD-10-CM

## 2014-04-11 DIAGNOSIS — K589 Irritable bowel syndrome without diarrhea: Secondary | ICD-10-CM | POA: Diagnosis present

## 2014-04-11 DIAGNOSIS — I272 Other secondary pulmonary hypertension: Secondary | ICD-10-CM | POA: Diagnosis present

## 2014-04-11 DIAGNOSIS — I2510 Atherosclerotic heart disease of native coronary artery without angina pectoris: Secondary | ICD-10-CM | POA: Diagnosis present

## 2014-04-11 DIAGNOSIS — K760 Fatty (change of) liver, not elsewhere classified: Secondary | ICD-10-CM

## 2014-04-11 DIAGNOSIS — R079 Chest pain, unspecified: Secondary | ICD-10-CM | POA: Diagnosis present

## 2014-04-11 DIAGNOSIS — R0789 Other chest pain: Secondary | ICD-10-CM | POA: Diagnosis not present

## 2014-04-11 DIAGNOSIS — I252 Old myocardial infarction: Secondary | ICD-10-CM

## 2014-04-11 HISTORY — DX: Chronic sinusitis, unspecified: J32.9

## 2014-04-11 HISTORY — DX: Acute on chronic diastolic (congestive) heart failure: I50.33

## 2014-04-11 HISTORY — DX: Acute suppurative otitis media without spontaneous rupture of ear drum, unspecified ear: H66.009

## 2014-04-11 HISTORY — DX: Pneumonia, unspecified organism: J18.9

## 2014-04-11 LAB — URINALYSIS, ROUTINE W REFLEX MICROSCOPIC
Bilirubin Urine: NEGATIVE
Glucose, UA: NEGATIVE mg/dL
Hgb urine dipstick: NEGATIVE
Ketones, ur: NEGATIVE mg/dL
LEUKOCYTES UA: NEGATIVE
NITRITE: NEGATIVE
PROTEIN: NEGATIVE mg/dL
Specific Gravity, Urine: 1.019 (ref 1.005–1.030)
Urobilinogen, UA: 1 mg/dL (ref 0.0–1.0)
pH: 6 (ref 5.0–8.0)

## 2014-04-11 LAB — COMPREHENSIVE METABOLIC PANEL
ALBUMIN: 3 g/dL — AB (ref 3.5–5.2)
ALK PHOS: 68 U/L (ref 39–117)
ALT: 33 U/L (ref 0–35)
AST: 44 U/L — ABNORMAL HIGH (ref 0–37)
Anion gap: 9 (ref 5–15)
BUN: 10 mg/dL (ref 6–23)
CHLORIDE: 101 mmol/L (ref 96–112)
CO2: 25 mmol/L (ref 19–32)
Calcium: 8.4 mg/dL (ref 8.4–10.5)
Creatinine, Ser: 0.71 mg/dL (ref 0.50–1.10)
GFR calc Af Amer: >90 mL/min (ref >90)
GFR calc non Af Amer: >90 mL/min (ref >90)
Glucose, Bld: 117 mg/dL — ABNORMAL HIGH (ref 70–99)
POTASSIUM: 4 mmol/L (ref 3.5–5.1)
SODIUM: 135 mmol/L (ref 135–145)
Total Bilirubin: 1.7 mg/dL — ABNORMAL HIGH (ref 0.3–1.2)
Total Protein: 6.7 g/dL (ref 6.0–8.3)

## 2014-04-11 LAB — I-STAT TROPONIN, ED
TROPONIN I, POC: 0 ng/mL (ref 0.00–0.08)
Troponin i, poc: 0 ng/mL (ref 0.00–0.08)

## 2014-04-11 LAB — CBC WITH DIFFERENTIAL/PLATELET
Basophils Absolute: 0 10*3/uL (ref 0.0–0.1)
Basophils Relative: 0 % (ref 0–1)
Eosinophils Absolute: 0.4 10*3/uL (ref 0.0–0.7)
Eosinophils Relative: 5 % (ref 0–5)
HCT: 39.6 % (ref 36.0–46.0)
Hemoglobin: 12.8 g/dL (ref 12.0–15.0)
LYMPHS PCT: 33 % (ref 12–46)
Lymphs Abs: 2.6 10*3/uL (ref 0.7–4.0)
MCH: 30.5 pg (ref 26.0–34.0)
MCHC: 32.3 g/dL (ref 30.0–36.0)
MCV: 94.5 fL (ref 78.0–100.0)
MONO ABS: 0.6 10*3/uL (ref 0.1–1.0)
Monocytes Relative: 7 % (ref 3–12)
Neutro Abs: 4.3 10*3/uL (ref 1.7–7.7)
Neutrophils Relative %: 55 % (ref 43–77)
Platelets: 200 10*3/uL (ref 150–400)
RBC: 4.19 MIL/uL (ref 3.87–5.11)
RDW: 13.6 % (ref 11.5–15.5)
WBC: 7.8 10*3/uL (ref 4.0–10.5)

## 2014-04-11 LAB — LIPASE, BLOOD: LIPASE: 26 U/L (ref 11–59)

## 2014-04-11 LAB — BRAIN NATRIURETIC PEPTIDE: B Natriuretic Peptide: 31 pg/mL (ref 0.0–100.0)

## 2014-04-11 MED ORDER — DOCUSATE SODIUM 100 MG PO CAPS
300.0000 mg | ORAL_CAPSULE | Freq: Every day | ORAL | Status: DC | PRN
  Administered 2014-04-11: 300 mg via ORAL
  Filled 2014-04-11: qty 3

## 2014-04-11 MED ORDER — PERFLUTREN LIPID MICROSPHERE
1.0000 mL | INTRAVENOUS | Status: AC | PRN
  Filled 2014-04-11: qty 10

## 2014-04-11 MED ORDER — ASPIRIN 81 MG PO CHEW
324.0000 mg | CHEWABLE_TABLET | Freq: Once | ORAL | Status: AC
  Administered 2014-04-11: 324 mg via ORAL
  Filled 2014-04-11: qty 4

## 2014-04-11 MED ORDER — LOSARTAN POTASSIUM 50 MG PO TABS
100.0000 mg | ORAL_TABLET | Freq: Every day | ORAL | Status: DC
  Administered 2014-04-11 – 2014-04-13 (×3): 100 mg via ORAL
  Filled 2014-04-11: qty 2
  Filled 2014-04-11: qty 1
  Filled 2014-04-11 (×2): qty 2

## 2014-04-11 MED ORDER — FUROSEMIDE 10 MG/ML IJ SOLN
80.0000 mg | Freq: Two times a day (BID) | INTRAMUSCULAR | Status: DC
  Administered 2014-04-11 – 2014-04-13 (×5): 80 mg via INTRAVENOUS
  Filled 2014-04-11 (×5): qty 8

## 2014-04-11 MED ORDER — ACETAMINOPHEN 325 MG PO TABS
650.0000 mg | ORAL_TABLET | Freq: Once | ORAL | Status: AC
  Administered 2014-04-11: 650 mg via ORAL
  Filled 2014-04-11: qty 2

## 2014-04-11 MED ORDER — SODIUM CHLORIDE 0.9 % IV SOLN: 250.0000 mL | INTRAVENOUS | Status: DC | PRN

## 2014-04-11 MED ORDER — SODIUM CHLORIDE 0.9 % IJ SOLN: 3.0000 mL | INTRAMUSCULAR | Status: DC | PRN

## 2014-04-11 MED ORDER — RIFAXIMIN 550 MG PO TABS
550.0000 mg | ORAL_TABLET | Freq: Two times a day (BID) | ORAL | Status: DC
  Administered 2014-04-11 – 2014-04-13 (×5): 550 mg via ORAL
  Filled 2014-04-11 (×5): qty 1

## 2014-04-11 MED ORDER — DM-GUAIFENESIN ER 30-600 MG PO TB12
1.0000 | ORAL_TABLET | Freq: Two times a day (BID) | ORAL | Status: DC
  Administered 2014-04-11 – 2014-04-13 (×5): 1 via ORAL
  Filled 2014-04-11 (×5): qty 1

## 2014-04-11 MED ORDER — NITROGLYCERIN 0.4 MG SL SUBL
0.4000 mg | SUBLINGUAL_TABLET | SUBLINGUAL | Status: AC | PRN
  Administered 2014-04-11 (×3): 0.4 mg via SUBLINGUAL
  Filled 2014-04-11: qty 1

## 2014-04-11 MED ORDER — TRAMADOL HCL 50 MG PO TABS
50.0000 mg | ORAL_TABLET | Freq: Four times a day (QID) | ORAL | Status: DC | PRN
  Administered 2014-04-11 (×2): 50 mg via ORAL
  Filled 2014-04-11 (×2): qty 1

## 2014-04-11 MED ORDER — ACETAMINOPHEN 325 MG PO TABS
650.0000 mg | ORAL_TABLET | ORAL | Status: DC | PRN
  Filled 2014-04-11: qty 2

## 2014-04-11 MED ORDER — DICYCLOMINE HCL 10 MG PO CAPS
10.0000 mg | ORAL_CAPSULE | Freq: Three times a day (TID) | ORAL | Status: DC
  Administered 2014-04-11 – 2014-04-13 (×5): 10 mg via ORAL
  Filled 2014-04-11 (×7): qty 1

## 2014-04-11 MED ORDER — SPIRONOLACTONE 12.5 MG HALF TABLET
12.5000 mg | ORAL_TABLET | Freq: Every day | ORAL | Status: DC
  Administered 2014-04-11 – 2014-04-13 (×3): 12.5 mg via ORAL
  Filled 2014-04-11 (×3): qty 1

## 2014-04-11 MED ORDER — HYDROCODONE-ACETAMINOPHEN 5-325 MG PO TABS
1.0000 | ORAL_TABLET | Freq: Once | ORAL | Status: AC
  Administered 2014-04-11: 1 via ORAL
  Filled 2014-04-11: qty 1

## 2014-04-11 MED ORDER — SODIUM CHLORIDE 0.9 % IJ SOLN
3.0000 mL | Freq: Two times a day (BID) | INTRAMUSCULAR | Status: DC
  Administered 2014-04-11 – 2014-04-12 (×3): 3 mL via INTRAVENOUS

## 2014-04-11 MED ORDER — ENOXAPARIN SODIUM 60 MG/0.6ML ~~LOC~~ SOLN
60.0000 mg | SUBCUTANEOUS | Status: DC
  Administered 2014-04-11 – 2014-04-12 (×2): 60 mg via SUBCUTANEOUS
  Filled 2014-04-11 (×3): qty 0.6

## 2014-04-11 MED ORDER — ONDANSETRON HCL 4 MG/2ML IJ SOLN: 4.0000 mg | Freq: Four times a day (QID) | INTRAMUSCULAR | Status: DC | PRN

## 2014-04-11 NOTE — ED Notes (Signed)
Pt was waken up by pain in her chest 7/10 in intensity, tightness in nature.  Pt has had a cold x 8 days.  Pt is speaking in full sentences.

## 2014-04-11 NOTE — ED Provider Notes (Signed)
CSN: [id number]     Arrival date & time 04/11/14  2355 History   First MD Initiated Contact with Patient 04/11/14 0631     Chief Complaint  Patient presents with  . Shortness of Breath     (Consider location/radiation/quality/duration/timing/severity/associated sxs/prior Treatment) HPI  [name] is a 54 y.o. female with past medical history significant for morbid obesity, obstructive sleep apnea, CAD (clean cath in 7322), diastolic CHF with normal EF, hyperlipidemia complaining of acute onset of left-sided, nonradiating chest pain described as "someone standing on my chest" rated at 7 out of 10, onset at 4 AM associated with shortness of breath which woke her from sleep. Patient does not take a daily aspirin. She gave herself a breathing treatment with minimal relief. Never had a pain like before. Patient's no 6 she's had increasing peripheral edema which is now resolved. She normally takes 80 mg of Lasix, she increased her dose to 120 for 4 days ending 3 days ago. Patient has a stable two-pillow orthopnea. She endorses her baseline dyspnea on exertion. She notes a productive cough for 7 days and tactile fever which resolved 6 days ago. Patient denies nausea, vomiting, change in bowel or bladder habits. On review of systems she notes a left flank pain which he states is new for her. No history of kidney stones. No hematuria.  Cardiologist: Hochrein PCP smothers  Past Medical History  Diagnosis Date  . Common migraine   . Morbid obesity   . Hyperlipidemia   . OSA (obstructive sleep apnea)     CPAP dependent  . Carpal tunnel syndrome   . Hypertension   . Allergic rhinitis   . CAD (coronary artery disease)     LHC (11/29/1999):  EF 60%; no significant CAD.  [name] Chronic diastolic CHF (congestive heart failure)     a. Echo (01/21/11):  Vigorous LVF, EF 65-70%, no RWMA, Gr 2 DD. ;  b.  Echo (12/15): Moderate LVH, EF 02-54%, grade 2 diastolic dysfunction, mild LAE, normal RV function  .  Diverticulitis    Past Surgical History  Procedure Laterality Date  . Tubal ligation    . Tonsillectomy    . Uvulopalatopharyngoplasty    . Cesarean section    . Nasal sinus surgery    . Partial hysterectomy     Family History  Problem Relation Age of Onset  . Coronary artery disease      1st degree relatvie <50  . Breast cancer      aunt- ? paternal or maternal  . Asthma Sister   . Heart disease Mother   . Heart attack Mother   . Hypertension Mother   . Stroke Neg Hx    History  Substance Use Topics  . Smoking status: Never Smoker   . Smokeless tobacco: Never Used  . Alcohol Use: No   OB History    No data available     Review of Systems  10 systems reviewed and found to be negative, except as noted in the HPI.   Allergies  Doxycycline; Latex; Ciprofloxacin; Fish allergy; Hydrocortisone; Neomycin; and Sulfonamide derivatives  Home Medications   Prior to Admission medications   Medication Sig Start Date End Date Taking? Authorizing Provider  albuterol (PROVENTIL HFA;VENTOLIN HFA) 108 (90 BASE) MCG/ACT inhaler Inhale 1-2 puffs into the lungs every 6 (six) hours as needed for wheezing or shortness of breath. 03/20/14  Yes Ripley Fraise, MD  dextromethorphan-guaiFENesin [facility] DM) 30-600 MG per 12 hr tablet Take 1 tablet  by mouth 2 (two) times daily.   Yes Historical Provider, MD  dicyclomine (BENTYL) 10 MG capsule TAKE 1 CAPSULE BY MOUTH 2 TO 3 TIMES DAILY 01/03/14  Yes Amy S Esterwood, PA-C  furosemide (LASIX) 40 MG tablet Take 2 tablets (80 mg total) by mouth daily. For swelling or fluid retention. 03/26/13  Yes Scott T Kathlen Mody, PA-C  losartan (COZAAR) 50 MG tablet TAKE 1 TABLET (50 MG TOTAL) BY MOUTH DAILY. 03/24/14  Yes Minus Breeding, MD  naproxen sodium (ANAPROX) 220 MG tablet Take 220 mg by mouth 2 (two) times daily with a meal.   Yes Historical Provider, MD  potassium chloride SA (K-DUR,KLOR-CON) 20 MEQ tablet Take 5 tablets (100 mEq total) by mouth 2 (two) times  daily. 01/11/14  Yes Scott Joylene Draft, PA-C  rifaximin (XIFAXAN) 550 MG TABS tablet Take 1 tablet (550 mg total) by mouth 2 (two) times daily. 02/01/14  Yes Irene Shipper, MD  traMADol (ULTRAM) 50 MG tablet Take 1 tablet (50 mg total) by mouth every 6 (six) hours as needed (for cough). 12/07/13  Yes Kathee Delton, MD  amoxicillin-clavulanate (AUGMENTIN) 875-125 MG per tablet Take 1 tablet by mouth 2 (two) times daily. Patient not taking: Reported on 04/11/2014 03/21/14   Kathee Delton, MD  fluconazole (DIFLUCAN) 100 MG tablet Take 1 tablet (100 mg total) by mouth daily. Patient not taking: Reported on 04/11/2014 03/24/14   Deneise Lever, MD  predniSONE (DELTASONE) 10 MG tablet Take 3 tabs/day for 3 days, 2 tabs/day for 3 days, 1 tab/day for 3 days then stop. Patient not taking: Reported on 04/11/2014 03/24/14   Deneise Lever, MD  predniSONE (DELTASONE) 50 MG tablet 1 tablet PO QD X4 days Patient not taking: Reported on 04/11/2014 03/20/14   Ripley Fraise, MD   BP 110/69 mmHg  Pulse 94  Temp(Src) 98.6 F (37 C) (Oral)  Resp 20  Ht 5\' 3"  (1.6 m)  Wt 280 lb (127.007 kg)  BMI 49.61 kg/m2  SpO2 96% Physical Exam  Constitutional: She is oriented to person, place, and time. She appears well-developed and well-nourished. No distress.  obese  HENT:  Head: Normocephalic.  Eyes: Conjunctivae and EOM are normal. Pupils are equal, round, and reactive to light.  Cardiovascular: Normal rate, regular rhythm and intact distal pulses.   Pulmonary/Chest: Effort normal and breath sounds normal. No stridor. No respiratory distress. She has no wheezes. She has no rales. She exhibits no tenderness.  Abdominal: Soft. Bowel sounds are normal. She exhibits no distension and no mass. There is no tenderness. There is no rebound and no guarding.  Musculoskeletal: Normal range of motion. She exhibits no edema or tenderness.  Neurological: She is alert and oriented to person, place, and time.  Psychiatric: She has a  normal mood and affect.  Nursing note and vitals reviewed.   ED Course  Procedures (including critical care time) Labs Review Labs Reviewed  URINALYSIS, ROUTINE W REFLEX MICROSCOPIC - Abnormal; Notable for the following:    APPearance CLOUDY (*)    All other components within normal limits  COMPREHENSIVE METABOLIC PANEL - Abnormal; Notable for the following:    Glucose, Bld 117 (*)    Albumin 3.0 (*)    AST 44 (*)    Total Bilirubin 1.7 (*)    All other components within normal limits  URINE CULTURE  BRAIN NATRIURETIC PEPTIDE  CBC WITH DIFFERENTIAL/PLATELET  LIPASE, BLOOD  I-STAT TROPOININ, ED  Randolm Idol, ED    Imaging  Review Dg Chest 2 View  04/11/2014   CLINICAL DATA:  Acute onset chest pain.  Congestion past 8 days  EXAM: CHEST  2 VIEW  COMPARISON:  March 20, 2014  FINDINGS: Lungs are clear. Heart is borderline enlarged but stable. Pulmonary vascularity is within normal limits. No adenopathy. No bone lesions. No pneumothorax.  IMPRESSION: Stable borderline cardiomegaly.  No edema or consolidation.   Electronically Signed   By: Lowella Grip III M.D.   On: 04/11/2014 07:20     EKG Interpretation   Date/Time:  Monday April 11 2014 07:22:35 EDT Ventricular Rate:  73 PR Interval:  162 QRS Duration: 101 QT Interval:  396 QTC Calculation: 436 R Axis:   54 Text Interpretation:  Sinus rhythm Borderline low voltage, extremity leads  Nonspecific T abnormalities, lateral leads No significant change since  last tracing Confirmed by DOCHERTY  MD, MEGAN ([id number]) on 04/11/2014 7:56:16  AM      MDM   Final diagnoses:  SOB (shortness of breath)  Chest pain, unspecified chest pain type    Filed Vitals:   04/11/14 0622 04/11/14 0629 04/11/14 0738 04/11/14 0800  BP: 182/87 153/74 133/63 110/69  Pulse: 73 73 94 94  Temp: 97.8 F (36.6 C) 98.6 F (37 C)    TempSrc: Oral Oral    Resp: 20 22 20 20   Height:  5\' 3"  (1.6 m)    Weight:  280 lb (127.007 kg)    SpO2:  98% 97% 97% 96%    Medications  aspirin chewable tablet 324 mg (324 mg Oral Given 04/11/14 0653)  nitroGLYCERIN (NITROSTAT) SL tablet 0.4 mg (0.4 mg Sublingual Given 04/11/14 0739)  acetaminophen (TYLENOL) tablet 650 mg (650 mg Oral Given 04/11/14 0755)  HYDROcodone-acetaminophen (NORCO/VICODIN) 5-325 MG per tablet 1 tablet (1 tablet Oral Given 04/11/14 0900)    [name] is a pleasant 54 y.o. female presenting with left-sided chest pain and left flank pain associated with shortness of breath, this woke her from sleep at 4 AM this morning. Patient does not appear volume overloaded, no edema, lung sounds are clear to auscultation.  Plan full cardiac workup. EKG is unchanged.  Patient reports significant improvement in pain after 3 sublingual nitroglycerin. She has a headache, will give her acetaminophen. Patient reports the left flank pain has now moved to the right side, rated at 5 out of 10, states that her chest pain is 1 out of 10 and that the shortness of breath have completely resolved with nitroglycerin. Blood work, urinalysis, chest x-ray with no acute abnormalities.  Patient is moderate risk by HEART score. Considering that this pain is atypical for her, relieved with nitroglycerin and last cath was >50yrs.  I think she warrants a observation admission.  He admitted to a telemetry bed by Dr. Maurice Small Woodson Macha, PA-C 04/11/14 6203  Ernestina Patches, MD 04/14/14 0040

## 2014-04-11 NOTE — Consult Note (Signed)
CARDIOLOGY CONSULT NOTE   Patient ID: [name] MRN: [id number] DOB/AGE: November 01, 1960 54 y.o.  Admit Date: 04/11/2014  Primary Physician: Theodis Aguas Andree Elk, NP  Primary Cardiologist:   Hochrein   Clinical Summary Ms. Mcnay is a 54 y.o.female. She is admitted with chest heaviness and shortness of breath. She also had some edema. She is been diuresis and feeling much better. We know historically that she has normal systolic function. Her last echo was in 2015. This will be repeated. She has pulmonary difficulties and a cyclical cough. I had a careful discussion with her about salt and fluid intake. She says that she does follow her salt intake. However she drinks significant excess free water. There is no acute EKG change. Troponins are normal so far.   Allergies  Allergen Reactions  . Doxycycline Nausea And Vomiting  . Latex Hives  . Ciprofloxacin Rash  . Fish Allergy Rash    Only reaction to Mackerel.  No other issues with any type of fish or shellfish currently   . Hydrocortisone Rash  . Neomycin Rash  . Sulfonamide Derivatives Swelling and Rash    Medications Scheduled Medications: . dextromethorphan-guaiFENesin  1 tablet Oral BID  . dicyclomine  10 mg Oral TID AC  . enoxaparin (LOVENOX) injection  60 mg Subcutaneous Q24H  . furosemide  80 mg Intravenous BID  . losartan  100 mg Oral Daily  . rifaximin  550 mg Oral BID  . sodium chloride  3 mL Intravenous Q12H  . spironolactone  12.5 mg Oral Daily     Infusions:     PRN Medications:  sodium chloride, acetaminophen, ondansetron (ZOFRAN) IV, sodium chloride, traMADol   Past Medical History  Diagnosis Date  . Common migraine   . Morbid obesity   . Hyperlipidemia   . OSA (obstructive sleep apnea)     CPAP dependent  . Carpal tunnel syndrome   . Hypertension   . Allergic rhinitis   . CAD (coronary artery disease)     LHC (11/29/1999):  EF 60%; no significant CAD.  [name] Chronic diastolic CHF (congestive  heart failure)     a. Echo (01/21/11):  Vigorous LVF, EF 65-70%, no RWMA, Gr 2 DD. ;  b.  Echo (12/15): Moderate LVH, EF 50-09%, grade 2 diastolic dysfunction, mild LAE, normal RV function  . Diverticulitis     Past Surgical History  Procedure Laterality Date  . Tubal ligation    . Tonsillectomy    . Uvulopalatopharyngoplasty    . Cesarean section    . Nasal sinus surgery    . Partial hysterectomy      Family History  Problem Relation Age of Onset  . Coronary artery disease      1st degree relatvie <50  . Breast cancer      aunt- ? paternal or maternal  . Asthma Sister   . Heart disease Mother   . Heart attack Mother   . Hypertension Mother   . Stroke Neg Hx     Social History Ms. Boardman reports that she has never smoked. She has never used smokeless tobacco. Ms. Sterling reports that she does not drink alcohol.  Review of Systems  Patient denies fever, chills, headache, sweats, rash, change in vision, change in hearing, nausea vomiting, urinary symptoms. All other systems are reviewed and are negative.  Physical Examination Blood pressure 146/65, pulse 83, temperature 98 F (36.7 C), temperature source Oral, resp. rate 20, height 5\' 3"  (1.6 m), weight 280  lb (127.007 kg), SpO2 96 %.  Intake/Output Summary (Last 24 hours) at 04/11/14 1549 Last data filed at 04/11/14 1300  Gross per 24 hour  Intake    240 ml  Output      0 ml  Net    240 ml   Patient feels much better as she is diuresis. She is now able to lie flat in bed. Head is atraumatic. Sclera and conjunctiva are normal. She is oriented to person time and place. Affect is normal. Neck exam is quite limited. Lungs reveal scattered rhonchi. Cardiac exam reveals S1 and S2. The abdomen is soft. There is now 1+ peripheral edema. She says that it is already markedly improved. There are no skin rashes. There are no musculoskeletal deformities. She is significantly overweight. Neurologic is grossly intact.  Prior Cardiac  Testing/Procedures  Lab Results  Basic Metabolic Panel:  Recent Labs Lab 04/11/14 0758  NA 135  K 4.0  CL 101  CO2 25  GLUCOSE 117*  BUN 10  CREATININE 0.71  CALCIUM 8.4    Liver Function Tests:  Recent Labs Lab 04/11/14 0758  AST 44*  ALT 33  ALKPHOS 68  BILITOT 1.7*  PROT 6.7  ALBUMIN 3.0*    CBC:  Recent Labs Lab 04/11/14 0703  WBC 7.8  NEUTROABS 4.3  HGB 12.8  HCT 39.6  MCV 94.5  PLT 200    Cardiac Enzymes: No results for input(s): CKTOTAL, CKMB, CKMBINDEX, TROPONINI in the last 168 hours.  BNP: Invalid input(s): POCBNP   Radiology: Dg Chest 2 View  04/11/2014   CLINICAL DATA:  Acute onset chest pain.  Congestion past 8 days  EXAM: CHEST  2 VIEW  COMPARISON:  March 20, 2014  FINDINGS: Lungs are clear. Heart is borderline enlarged but stable. Pulmonary vascularity is within normal limits. No adenopathy. No bone lesions. No pneumothorax.  IMPRESSION: Stable borderline cardiomegaly.  No edema or consolidation.   Electronically Signed   By: Lowella Grip III M.D.   On: 04/11/2014 07:20     ECG: I have reviewed old and current EKGs. There are no acute changes.   Impression and Recommendations    Chest pain     At this time I feel that there is no evidence of an acute coronary syndrome. I believe that her chest heaviness is related to her CHF.    Morbid obesity   Obstructive sleep apnea   LEG PAIN, BILATERAL   Sinusitis, chronic   Diverticulitis    Acute on chronic diastolic CHF (congestive heart failure)      Despite the fact that the patient's chest x-ray did not show any marked abnormalities, I feel that her symptoms are related to volume overload. She had worsening edema. She's had a very significant rapid response with diuresis. There are no significant EKG changes and no significant troponin abnormalities. I would not recommend further inpatient ischemic workup. I had a long discussion with her encouraging her to be careful and limit  both her salt intake and her total fluid intake including water.   Daryel November, MD 04/11/2014, 3:49 PM

## 2014-04-11 NOTE — H&P (Signed)
. [facility] History and Physical  [name] ZOX:[id number] DOB: Jun 21, 1960 DOA: 04/11/2014  Referring physician: ED PCP: Junie Panning, NP  Specialists: Cardiology  Chief Complaint: Shortness of breath chest pain  HPI: 54 y/o ? c prior atypical CP documented on varied OV notes [see 81/19/14 note] Diastolic HF +/- ?Cardiomyopathy, Clean cardiac cath ~ 2001,OSA/OHSS on CPAP, chronic sinusitis + cyclical cough, Remote colonoscopy 2005 wnl, Steatosis on [place] abd 02/04/14, Diverticulitis Rx 02/2013 and possible IBS presented to [facility] 04/11/14 Recent Rx prednisone taper + Augmentin 03/24/14 after emergency room visit 03/20/14 where she had dyspnea on exertion and wheezing States that she started feeling poorly once again 04/04/14, noted lower extremity swelling and discomfort and decided to ? Lasix 80 twice a day-->Lasix 120 twice a day until 3/18. She was having ongoing left cough discomfort with ambulation which resolved subsequent to this increase. Also noted ? Dyspnea on exertion--usually works at a pretty physical and active job at [employer] and is able to walk around the campus in about 5-10 minutes. This has not changed recently. Uses her CPAP for the past 4 years after uvuloplasty~2012 states that device has not been adjusted and also sees Dr. Gwenette Greet as well as Dr. Ernesto Rutherford for cyclical cough as well as? Sinusitis Does not weigh herself at home Ultimately started feeling chest discomfort tightness at 0 400 3/21, pain in the left breast, reproducible with pressure and when she laid down she felt as if she was gasping for air. Placed herself on CPAP, then got up and used albuterol nebulization without much relief Character of pain defined as sharp, constant and started over the weekend on 3/19. In emergency room and given nitroglycerin X2 which completely relieved the pain Notes indicate has had workup in the past for pulmonary embolism as late as December of 2015 and that was  negative  States increasing sputum at around the time that she had the antibiotics given to her on the 29th which was initially white and thick but then turned less tenacious and more yellow.   Emergency room workup EKG sinus rhythm, PR interval 0.20, QRS axis 10, no ST-T wave changes no inversions, there is a chronic V5 and V6 T-wave inversion which is chronic when compared to EKG done in 03/21/58 Chem-7 is normal, point-of-care troponin is normal AST 44, total bilirubin 1.7 CBC and differential normal CXR 2 view = stable borderline cardiomegaly   Review of Systems:   Past Medical History  Diagnosis Date  . Common migraine   . Morbid obesity   . Hyperlipidemia   . OSA (obstructive sleep apnea)     CPAP dependent  . Carpal tunnel syndrome   . Hypertension   . Allergic rhinitis   . CAD (coronary artery disease)     LHC (11/29/1999):  EF 60%; no significant CAD.  [name] Chronic diastolic CHF (congestive heart failure)     a. Echo (01/21/11):  Vigorous LVF, EF 65-70%, no RWMA, Gr 2 DD. ;  b.  Echo (12/15): Moderate LVH, EF 78-29%, grade 2 diastolic dysfunction, mild LAE, normal RV function  . Diverticulitis    Past Surgical History  Procedure Laterality Date  . Tubal ligation    . Tonsillectomy    . Uvulopalatopharyngoplasty    . Cesarean section    . Nasal sinus surgery    . Partial hysterectomy     Social History:  History   Social History Narrative   Charity fundraiser, Oceanographer.   Widowed, lives  with son.    Allergies  Allergen Reactions  . Doxycycline Nausea And Vomiting  . Latex Hives  . Ciprofloxacin Rash  . Fish Allergy Rash    Only reaction to Mackerel.  No other issues with any type of fish or shellfish currently   . Hydrocortisone Rash  . Neomycin Rash  . Sulfonamide Derivatives Swelling and Rash    Family History  Problem Relation Age of Onset  . Coronary artery disease      1st degree relatvie <50  . Breast cancer      aunt- ? paternal or  maternal  . Asthma Sister   . Heart disease Mother   . Heart attack Mother   . Hypertension Mother   . Stroke Neg Hx     Prior to Admission medications   Medication Sig Start Date End Date Taking? Authorizing Provider  albuterol (PROVENTIL HFA;VENTOLIN HFA) 108 (90 BASE) MCG/ACT inhaler Inhale 1-2 puffs into the lungs every 6 (six) hours as needed for wheezing or shortness of breath. 03/20/14  Yes Ripley Fraise, MD  dextromethorphan-guaiFENesin [facility] DM) 30-600 MG per 12 hr tablet Take 1 tablet by mouth 2 (two) times daily.   Yes Historical Provider, MD  dicyclomine (BENTYL) 10 MG capsule TAKE 1 CAPSULE BY MOUTH 2 TO 3 TIMES DAILY 01/03/14  Yes Amy S Esterwood, PA-C  furosemide (LASIX) 40 MG tablet Take 2 tablets (80 mg total) by mouth daily. For swelling or fluid retention. 03/26/13  Yes Scott T Kathlen Mody, PA-C  losartan (COZAAR) 50 MG tablet TAKE 1 TABLET (50 MG TOTAL) BY MOUTH DAILY. 03/24/14  Yes Minus Breeding, MD  naproxen sodium (ANAPROX) 220 MG tablet Take 220 mg by mouth 2 (two) times daily with a meal.   Yes Historical Provider, MD  potassium chloride SA (K-DUR,KLOR-CON) 20 MEQ tablet Take 5 tablets (100 mEq total) by mouth 2 (two) times daily. 01/11/14  Yes Scott Joylene Draft, PA-C  rifaximin (XIFAXAN) 550 MG TABS tablet Take 1 tablet (550 mg total) by mouth 2 (two) times daily. 02/01/14  Yes Irene Shipper, MD  traMADol (ULTRAM) 50 MG tablet Take 1 tablet (50 mg total) by mouth every 6 (six) hours as needed (for cough). 12/07/13  Yes Kathee Delton, MD  amoxicillin-clavulanate (AUGMENTIN) 875-125 MG per tablet Take 1 tablet by mouth 2 (two) times daily. Patient not taking: Reported on 04/11/2014 03/21/14   Kathee Delton, MD  fluconazole (DIFLUCAN) 100 MG tablet Take 1 tablet (100 mg total) by mouth daily. Patient not taking: Reported on 04/11/2014 03/24/14   Deneise Lever, MD  predniSONE (DELTASONE) 10 MG tablet Take 3 tabs/day for 3 days, 2 tabs/day for 3 days, 1 tab/day for 3 days then  stop. Patient not taking: Reported on 04/11/2014 03/24/14   Deneise Lever, MD  predniSONE (DELTASONE) 50 MG tablet 1 tablet PO QD X4 days Patient not taking: Reported on 04/11/2014 03/20/14   Ripley Fraise, MD   Physical Exam: Filed Vitals:   04/11/14 0738 04/11/14 0800 04/11/14 1000 04/11/14 1021  BP: 133/63 110/69 107/86 157/74  Pulse: 94 94 65 67  Temp:      TempSrc:      Resp: 20 20 20 24   Height:      Weight:      SpO2: 97% 96% 97% 96%    Pleasant Body mass index is 49.61 kg/(m^2). female  no pallor no icterus, Mallampati 4 Thick neck precluding JVD exam, no bruit Chest wall tenderness reproducible under left  rest Chest is clear to auscultation S1-S2 no murmur rub or gallop Obese nontender nondistended no rebound no guarding No lower extremity edema, pulses are intact No rash   Labs on Admission:  Basic Metabolic Panel:  Recent Labs Lab 04/11/14 0758  NA 135  K 4.0  CL 101  CO2 25  GLUCOSE 117*  BUN 10  CREATININE 0.71  CALCIUM 8.4   Liver Function Tests:  Recent Labs Lab 04/11/14 0758  AST 44*  ALT 33  ALKPHOS 68  BILITOT 1.7*  PROT 6.7  ALBUMIN 3.0*    Recent Labs Lab 04/11/14 0758  LIPASE 26   No results for input(s): AMMONIA in the last 168 hours. CBC:  Recent Labs Lab 04/11/14 0703  WBC 7.8  NEUTROABS 4.3  HGB 12.8  HCT 39.6  MCV 94.5  PLT 200   Cardiac Enzymes: No results for input(s): CKTOTAL, CKMB, CKMBINDEX, TROPONINI in the last 168 hours.  BNP (last 3 results)  Recent Labs  04/11/14 0703  BNP 31.0    ProBNP (last 3 results) No results for input(s): PROBNP in the last 8760 hours.  CBG: No results for input(s): GLUCAP in the last 168 hours.  Radiological Exams on Admission: Dg Chest 2 View  04/11/2014   CLINICAL DATA:  Acute onset chest pain.  Congestion past 8 days  EXAM: CHEST  2 VIEW  COMPARISON:  March 20, 2014  FINDINGS: Lungs are clear. Heart is borderline enlarged but stable. Pulmonary vascularity is  within normal limits. No adenopathy. No bone lesions. No pneumothorax.  IMPRESSION: Stable borderline cardiomegaly.  No edema or consolidation.   Electronically Signed   By: Lowella Grip III M.D.   On: 04/11/2014 07:20    EKG: Independently reviewed. As above  Assessment/Plan Principal Problem:   Chest pain + multifactorial dyspnea-BNP of 31, echocardiogram pending-prior history of diastolic dysfunction -she has also been diagnosed with cyclical cough, chronic sinusitis and has a possible restrictive component to her breathing. She will need to be ruled out because of a heart score of about 4 by cardiac enzymes and I have asked cardiology to see her in consult as she has last had a cath in 2001 which was clean Her main risk factors are family history, but she tells me that her mother developed a heart attack at age 70 when she gave birth-I'm not really sure what the rest of the details are and we'll have to investigate this further. I have asked cardiology to see her and risk stratify her. We will get a lipid panel--we will allow a diet today I have ordered an echocardiogram to compare with prior I will diuresis with 2-2-1/2 times home dose of her Lasix as 80 mg IV twice a day    Chronic sinusitis + cyclical cough, + upper airway reactivity-usually states that when she has this issue she takes steroids and albuterol inhalers and is better. At some point we may need to enlist the help of pulmonology to determine how to best help her.  I will cc this to her regular pulmonologist Dr. Gwenette Greet as an [place] obesity-her most likely limiting disease is probably this    Obstructive sleep apnea- we will have respiratory auto titrate the patient on CPAP tonight. She may bring her own machine if she is able to   MYOCARDIAL INFARCTION, HX OF-I'm unable to find any objective evidence as to what infarction she had    LEG PAIN, BILATERAL-likely related to obesity as well  as tissue fluid. BNP in her  case    Hepatic steatosis-likely secondary to [place]-We will also start Aldactone as a component of her issues may be steatosis I will get an INR as well in the morning   Presumed full code  No family at bedside  Time spent: [place], [facility] Pager [phone]  If 7PM-7AM, please contact night-coverage [email or web address] Password [facility] 04/11/2014, 10:42 AM

## 2014-04-11 NOTE — ED Notes (Signed)
Patient transported to X-ray 

## 2014-04-11 NOTE — ED Notes (Signed)
MD at bedside. 

## 2014-04-11 NOTE — Progress Notes (Signed)
  Echocardiogram 2D Echocardiogram has been performed.  [name] 04/11/2014, 12:17 PM

## 2014-04-12 DIAGNOSIS — I5033 Acute on chronic diastolic (congestive) heart failure: Principal | ICD-10-CM

## 2014-04-12 DIAGNOSIS — I252 Old myocardial infarction: Secondary | ICD-10-CM

## 2014-04-12 LAB — BASIC METABOLIC PANEL
Anion gap: 9 (ref 5–15)
BUN: 12 mg/dL (ref 6–23)
CHLORIDE: 101 mmol/L (ref 96–112)
CO2: 30 mmol/L (ref 19–32)
CREATININE: 0.73 mg/dL (ref 0.50–1.10)
Calcium: 8.9 mg/dL (ref 8.4–10.5)
GFR calc Af Amer: >90 mL/min (ref >90)
GFR calc non Af Amer: >90 mL/min (ref >90)
Glucose, Bld: 121 mg/dL — ABNORMAL HIGH (ref 70–99)
POTASSIUM: 3.3 mmol/L — AB (ref 3.5–5.1)
Sodium: 140 mmol/L (ref 135–145)

## 2014-04-12 MED ORDER — POTASSIUM CHLORIDE CRYS ER 20 MEQ PO TBCR
40.0000 meq | EXTENDED_RELEASE_TABLET | Freq: Every day | ORAL | Status: DC
  Administered 2014-04-12 – 2014-04-13 (×2): 40 meq via ORAL
  Filled 2014-04-12 (×2): qty 2

## 2014-04-12 NOTE — Care Management Note (Addendum)
    Page 1 of 1   04/13/2014     11:10:02 AM CARE MANAGEMENT NOTE 04/13/2014  Patient:  [name], [name]   Account Number:  [id number]  Date Initiated:  04/12/2014  Documentation initiated by:  Dessa Phi  Subjective/Objective Assessment:   54 y/o f admitted w/CHF.     Action/Plan:   From home.   Anticipated DC Date:  04/13/2014   Anticipated DC Plan:  [place]  CM consult      Choice offered to / List presented to:             Status of service:  Completed, signed off Medicare Important Message given?   (If response is "NO", the following Medicare IM given date fields will be blank) Date Medicare IM given:   Medicare IM given by:   Date Additional Medicare IM given:   Additional Medicare IM given by:    Discharge Disposition:  HOME/SELF CARE  Per UR Regulation:  Reviewed for med. necessity/level of care/duration of stay  If discussed at [place] of Stay Meetings, dates discussed:    Comments:  04/13/14 Dessa Phi RN BSN NCM [phone] d/c home no needs or orders.  04/12/14 Dessa Phi RN BSN NCM [phone] No anticipated d/c needs.

## 2014-04-12 NOTE — Progress Notes (Addendum)
[name] GYK:[id number] DOB: 1960-04-15 DOA: 04/11/2014 PCP: Junie Panning, NP  Brief narrative: 54 y/o ? c prior atypical CP documented on varied OV notes [see 79/39/03 note] Diastolic HF +/- ?Cardiomyopathy, Clean cardiac cath ~ 2001,OSA/OHSS on CPAP, chronic sinusitis + cyclical cough, Remote colonoscopy 2005 wnl, Steatosis on [place] abd 02/04/14, Diverticulitis Rx 02/2013 and possible IBS presented to [facility] 04/11/14 With dyspnea on exertion, wheezing and recent increase in her Lasix dose by herself.  Past medical history-As per Problem list Chart reviewed as below- reviewed  Consultants:  cardiology  Procedures:  none  Antibiotics:  none   Subjective   Doing well. Wants to go home and wants to understand why she is here. I provided her with a copy of her echocardiogram as she did not believe me when I told her she had heart failure --- She had previously told by her primary cardiologist Dr. Percival Spanish that she didn't have heart failure in 12/2013 She is compliant on fluid restriction to some extent She has no further chest pain   Objective    Interim History: none  Telemetry: Sinus tachycardia + pacs nsr   Objective: Filed Vitals:   04/11/14 1405 04/11/14 2100 04/11/14 2141 04/12/14 0516  BP: 146/65 142/65  129/66  Pulse: 83 98 94 63  Temp: 98 F (36.7 C) 98 F (36.7 C)  97.9 F (36.6 C)  TempSrc: Oral Oral  Oral  Resp: 20 19 18 18   Height:      Weight:    141.568 kg (312 lb 1.6 oz)  SpO2: 96% 95% 96% 99%    Intake/Output Summary (Last 24 hours) at 04/12/14 1714 Last data filed at 04/12/14 0852  Gross per 24 hour  Intake    360 ml  Output      0 ml  Net    360 ml    Exam:  General: Morbid obesity, Body mass index is 55.3 kg/(m^2). pleasant Cardiovascular: s1 s2 tachy ~120, cannot appreciate JVD Respiratory: Clinically clear, no added sound Abdomen: Soft nontender nondistended no rebound Skin no lower extremity edema Neuro  intact  Data Reviewed: Basic Metabolic Panel:  Recent Labs Lab 04/11/14 0758 04/12/14 0555  NA 135 140  K 4.0 3.3*  CL 101 101  CO2 25 30  GLUCOSE 117* 121*  BUN 10 12  CREATININE 0.71 0.73  CALCIUM 8.4 8.9   Liver Function Tests:  Recent Labs Lab 04/11/14 0758  AST 44*  ALT 33  ALKPHOS 68  BILITOT 1.7*  PROT 6.7  ALBUMIN 3.0*    Recent Labs Lab 04/11/14 0758  LIPASE 26   No results for input(s): AMMONIA in the last 168 hours. CBC:  Recent Labs Lab 04/11/14 0703  WBC 7.8  NEUTROABS 4.3  HGB 12.8  HCT 39.6  MCV 94.5  PLT 200   Cardiac Enzymes: No results for input(s): CKTOTAL, CKMB, CKMBINDEX, TROPONINI in the last 168 hours. BNP: Invalid input(s): POCBNP CBG: No results for input(s): GLUCAP in the last 168 hours.  Recent Results (from the past 240 hour(s))  Urine culture     Status: None (Preliminary result)   Collection Time: 04/11/14  7:06 AM  Result Value Ref Range Status   Specimen Description URINE, CLEAN CATCH  Final   Special Requests NONE  Final   Colony Count   Final    >=100,000 COLONIES/ML Performed at [facility] Performed at [facility]    Report Status PENDING  Incomplete     Studies:              All Imaging reviewed and is as per above notation   Scheduled Meds: . dextromethorphan-guaiFENesin  1 tablet Oral BID  . dicyclomine  10 mg Oral TID AC  . enoxaparin (LOVENOX) injection  60 mg Subcutaneous Q24H  . furosemide  80 mg Intravenous BID  . losartan  100 mg Oral Daily  . potassium chloride  40 mEq Oral Daily  . rifaximin  550 mg Oral BID  . sodium chloride  3 mL Intravenous Q12H  . spironolactone  12.5 mg Oral Daily   Continuous Infusions:    Assessment/Plan: 1. Acute decompensated grade 2 diastolic dysfunction per echocardiogram documented 3/21-continue IV Lasix 80 twice a day. We will also continue Aldactone 12.5 daily. Because of her tachyarrhythmia  I feel we should start low-dose metoprolol tartrate 12.5 twice a day, continue losartan 100 daily  We will defer to the judgment of cardiology in the morning to make further adjustments. 2. Clean cardiac cath 2001-no current role for repeat cath according to cardiology from an ACS standpoint-feels that patient may benefit from right and left-sided heart cath as the patient most likely has pulmonary hypertension 3. OSA/OHS S on CPAP-continue CPAP per home regimen 4. Hepatic steatosis-continue Aldactone low-dose that was started this admission 5. Prior history diverticulitis-monitor-currently stable. Continue other meds 6. Possible IBS -continue rifaximin 550 twice a day, dicyclomine 10 3 times a day before meals 7. Asymptomatic bacteriuria on clean catch urine-do not treat as no fever  Code Status: Full Family Communication: No family at bedside Disposition Plan: Pending resolution   Verneita Griffes, MD  [facility] Pager [phone] 04/12/2014, 5:14 PM    LOS: 1 day

## 2014-04-13 ENCOUNTER — Encounter (HOSPITAL_COMMUNITY): Payer: Self-pay | Admitting: Internal Medicine

## 2014-04-13 DIAGNOSIS — K589 Irritable bowel syndrome without diarrhea: Secondary | ICD-10-CM

## 2014-04-13 DIAGNOSIS — E876 Hypokalemia: Secondary | ICD-10-CM

## 2014-04-13 DIAGNOSIS — K588 Other irritable bowel syndrome: Secondary | ICD-10-CM | POA: Diagnosis present

## 2014-04-13 DIAGNOSIS — K760 Fatty (change of) liver, not elsewhere classified: Secondary | ICD-10-CM

## 2014-04-13 LAB — BASIC METABOLIC PANEL
ANION GAP: 12 (ref 5–15)
BUN: 17 mg/dL (ref 6–23)
CHLORIDE: 101 mmol/L (ref 96–112)
CO2: 28 mmol/L (ref 19–32)
Calcium: 9.1 mg/dL (ref 8.4–10.5)
Creatinine, Ser: 0.82 mg/dL (ref 0.50–1.10)
GFR calc non Af Amer: 79 mL/min — ABNORMAL LOW (ref >90)
Glucose, Bld: 122 mg/dL — ABNORMAL HIGH (ref 70–99)
POTASSIUM: 3.4 mmol/L — AB (ref 3.5–5.1)
SODIUM: 141 mmol/L (ref 135–145)

## 2014-04-13 LAB — URINE CULTURE

## 2014-04-13 MED ORDER — LOSARTAN POTASSIUM 100 MG PO TABS: 100.0000 mg | ORAL_TABLET | Freq: Every day | ORAL | Status: DC

## 2014-04-13 MED ORDER — ENOXAPARIN SODIUM 80 MG/0.8ML ~~LOC~~ SOLN
70.0000 mg | SUBCUTANEOUS | Status: DC
  Filled 2014-04-13: qty 0.8

## 2014-04-13 MED ORDER — SPIRONOLACTONE 25 MG PO TABS: 12.5000 mg | ORAL_TABLET | Freq: Every day | ORAL | Status: DC

## 2014-04-13 MED ORDER — DICYCLOMINE HCL 10 MG PO CAPS: ORAL_CAPSULE | ORAL | Status: DC

## 2014-04-13 MED ORDER — POTASSIUM CHLORIDE CRYS ER 20 MEQ PO TBCR
20.0000 meq | EXTENDED_RELEASE_TABLET | Freq: Two times a day (BID) | ORAL | Status: DC
Start: 2014-04-13 — End: 2014-04-28

## 2014-04-13 MED ORDER — FUROSEMIDE 40 MG PO TABS: 80.0000 mg | ORAL_TABLET | Freq: Two times a day (BID) | ORAL | Status: DC

## 2014-04-13 MED ORDER — RIFAXIMIN 550 MG PO TABS: 550.0000 mg | ORAL_TABLET | Freq: Two times a day (BID) | ORAL | Status: DC

## 2014-04-13 NOTE — Progress Notes (Signed)
Patient ID: [name], female   DOB: 1960/08/21, 54 y.o.   MRN: [id number]    Subjective:  Wants to go home   Objective:  Filed Vitals:   04/11/14 2141 04/12/14 0516 04/12/14 2104 04/13/14 0433  BP:  129/66 115/69 111/57  Pulse: 94 63 99 85  Temp:  97.9 F (36.6 C) 98.8 F (37.1 C) 98 F (36.7 C)  TempSrc:  Oral Oral Oral  Resp: 18 18 18 18   Height:      Weight:  312 lb 1.6 oz (141.568 kg)  310 lb 14.4 oz (141.023 kg)  SpO2: 96% 99% 99% 97%    Intake/Output from previous day:  Intake/Output Summary (Last 24 hours) at 04/13/14 7510 Last data filed at 04/12/14 2130  Gross per 24 hour  Intake   1020 ml  Output   1450 ml  Net   -430 ml    Physical Exam: Affect appropriate Obese black female  HEENT: normal Neck supple with no adenopathy JVP normal no bruits no thyromegaly Lungs clear with no wheezing and good diaphragmatic motion Heart:  S1/S2 no murmur, no rub, gallop or click PMI normal Abdomen: benighn, BS positve, no tenderness, no AAA no bruit.  No HSM or HJR Distal pulses intact with no bruits Plus one bilateral  edema Neuro non-focal Skin warm and dry No muscular weakness   Lab Results: Basic Metabolic Panel:  Recent Labs  04/12/14 0555 04/13/14 0518  NA 140 141  K 3.3* 3.4*  CL 101 101  CO2 30 28  GLUCOSE 121* 122*  BUN 12 17  CREATININE 0.73 0.82  CALCIUM 8.9 9.1   Liver Function Tests:  Recent Labs  04/11/14 0758  AST 44*  ALT 33  ALKPHOS 68  BILITOT 1.7*  PROT 6.7  ALBUMIN 3.0*    Recent Labs  04/11/14 0758  LIPASE 26   CBC:  Recent Labs  04/11/14 0703  WBC 7.8  NEUTROABS 4.3  HGB 12.8  HCT 39.6  MCV 94.5  PLT 200    Imaging: No results found.  Cardiac Studies:  ECG:  SR low voltage nonspecific ST changes    Telemetry:  NSR 04/13/2014   Echo:  3/21  EF 60-65%  pseudonormal diastolic dysfunction    Medications:   . dextromethorphan-guaiFENesin  1 tablet Oral BID  . dicyclomine  10 mg Oral TID AC  .  enoxaparin (LOVENOX) injection  60 mg Subcutaneous Q24H  . furosemide  80 mg Intravenous BID  . losartan  100 mg Oral Daily  . potassium chloride  40 mEq Oral Daily  . rifaximin  550 mg Oral BID  . sodium chloride  3 mL Intravenous Q12H  . spironolactone  12.5 mg Oral Daily       Assessment/Plan:  CHF:   Also discussed diastolic vs systolic dysfunction with patient.  Her issue is morbid obesity with likely OSA and secondary pulmonary HTN.  D/C with lasix 80 bid And Aldactone 25 mg  She was on a lot of K prior to admission and I would send home on only 20 meQ with BMET to be checked Monday given postassium sparing  Effects of aldactone  Losartan dose increased to 100  Would not add beta blocker until  She is on stable dose of aldactone in case she develops hyperkalemia Dr Percival Spanish can address this as outpatient  Will arrange f/u with him   Jenkins Rouge 04/13/2014, 8:29 AM

## 2014-04-13 NOTE — Discharge Summary (Signed)
Physician Discharge Summary  [name] UUV:[id number] DOB: Sep 09, 1960 DOA: 04/11/2014  PCP: Junie Panning, NP  Admit date: 04/11/2014 Discharge date: 04/13/2014   Recommendations for Outpatient Follow-Up:   1. Dr. Johnsie Cancel will arrange outpatient F/U with Dr. Percival Spanish, the patient's cardiologist. 2. Please check electrolytes post discharge as her medications have been adjusted with the addition of spironolactone.   Discharge Diagnosis:   Principal Problem:    Chest pain secondary to Acute on chronic diastolic CHF (congestive heart failure) Active Problems:    Morbid obesity    Obstructive sleep apnea    Hypokalemia    Asymptomatic bacteriuria    Irritable bowel syndrome    Hepatic steatosis   Discharge Condition: Improved.  Diet recommendation: Low sodium, heart healthy.     History of Present Illness:   The patient is a 54 year old female with a PMH of atypical chest pain, chronic diastolic CHF with echocardiogram 01/03/14 showing an EF of 60-65 percent and grade 2 diastolic dysfunction who was admitted with chest pain and dyspnea in the setting of significant fluid intake.   Hospital Course by Problem:   Principal Problem:   Chest pain secondary to Acute on chronic diastolic CHF (congestive heart failure) - Repeat echocardiogram done 04/11/14 shows EF 60-65 percent with grade 2 diastolic dysfunction, similar to prior. - Troponins negative 2, BNP 31. - Weight 143.38 kg on admission to discharge weight of 141.02 kg with diuresis. - Discharged on Lasix 80 mg twice a day, spironolactone 12.5 mg daily. - No current indication for beta blocker therapy until stable per cardiology recommendations. - Patient was educated about weighing herself daily, adhering to fluid restrictions and salt restrictions to prevent decompensated heart failure.  Active Problems:   Hypokalemia - Secondary to diuretics. Was taking 100 mEq of oral potassium prior to admission. -  Still mildly hypokalemic and although cardiology recommends discharging on 20 mEq daily, would use 20 mEq twice a day. - We'll need close follow-up given introduction of spironolactone which may help her hold onto potassium better.    Morbid obesity / hepatic steatosis - Recommend ongoing diet education and encouragement for weight loss efforts.    Obstructive sleep apnea - Continue C Pap per home regimen.    Irritable bowel syndrome - Continue Bentyl and rifaximin.    Asymptomatic bacteriuria - No antibiotics were prescribed. Cultures positive for Enterobacter.  Medical Consultants:    Dr. Dola Argyle, Cardiology   Discharge Exam:   Filed Vitals:   04/13/14 0433  BP: 111/57  Pulse: 85  Temp: 98 F (36.7 C)  Resp: 18   Filed Vitals:   04/11/14 2141 04/12/14 0516 04/12/14 2104 04/13/14 0433  BP:  129/66 115/69 111/57  Pulse: 94 63 99 85  Temp:  97.9 F (36.6 C) 98.8 F (37.1 C) 98 F (36.7 C)  TempSrc:  Oral Oral Oral  Resp: 18 18 18 18   Height:      Weight:  141.568 kg (312 lb 1.6 oz)  141.023 kg (310 lb 14.4 oz)  SpO2: 96% 99% 99% 97%    Gen:  NAD Cardiovascular:  RRR, No M/R/G Respiratory: Lungs CTAB Gastrointestinal: Abdomen soft, NT/ND with normal active bowel sounds. Extremities: No C/E/C   The results of significant diagnostics from this hospitalization (including imaging, microbiology, ancillary and laboratory) are listed below for reference.     Procedures and Diagnostic Studies:   Dg Chest 2 View 04/11/2014: Stable borderline cardiomegaly.  No edema or consolidation.  2-D echocardiogram 04/11/14: EF 60-65 percent. No regional wall motion abnormalities. Grade 2 diastolic dysfunction.  Labs:   Basic Metabolic Panel:  Recent Labs Lab 04/11/14 0758 04/12/14 0555 04/13/14 0518  NA 135 140 141  K 4.0 3.3* 3.4*  CL 101 101 101  CO2 25 30 28   GLUCOSE 117* 121* 122*  BUN 10 12 17   CREATININE 0.71 0.73 0.82  CALCIUM 8.4 8.9 9.1    GFR Estimated Creatinine Clearance: 107.4 mL/min (by C-G formula based on Cr of 0.82). Liver Function Tests:  Recent Labs Lab 04/11/14 0758  AST 44*  ALT 33  ALKPHOS 68  BILITOT 1.7*  PROT 6.7  ALBUMIN 3.0*    Recent Labs Lab 04/11/14 0758  LIPASE 26   CBC:  Recent Labs Lab 04/11/14 0703  WBC 7.8  NEUTROABS 4.3  HGB 12.8  HCT 39.6  MCV 94.5  PLT 200   Microbiology Recent Results (from the past 240 hour(s))  Urine culture     Status: None   Collection Time: 04/11/14  7:06 AM  Result Value Ref Range Status   Specimen Description URINE, CLEAN CATCH  Final   Special Requests NONE  Final   Colony Count   Final    >=100,000 COLONIES/ML Performed at [facility]    Culture   Final    ENTEROBACTER AEROGENES Performed at [facility]    Report Status 04/13/2014 FINAL  Final   Organism ID, Bacteria ENTEROBACTER AEROGENES  Final      Susceptibility   Enterobacter aerogenes - MIC*    CEFAZOLIN >=64 RESISTANT Resistant     CEFTRIAXONE <=1 SENSITIVE Sensitive     CIPROFLOXACIN <=0.25 SENSITIVE Sensitive     GENTAMICIN <=1 SENSITIVE Sensitive     LEVOFLOXACIN <=0.12 SENSITIVE Sensitive     NITROFURANTOIN 128 RESISTANT Resistant     TOBRAMYCIN <=1 SENSITIVE Sensitive     TRIMETH/SULFA <=20 SENSITIVE Sensitive     PIP/TAZO 8 SENSITIVE Sensitive     * ENTEROBACTER AEROGENES     Discharge Instructions:       Discharge Instructions    (HEART FAILURE PATIENTS) Call MD:  Anytime you have any of the following symptoms: 1) 3 pound weight gain in 24 hours or 5 pounds in 1 week 2) shortness of breath, with or without a dry hacking cough 3) swelling in the hands, feet or stomach 4) if you have to sleep on extra pillows at night in order to breathe.    Complete by:  As directed      Call MD for:  extreme fatigue    Complete by:  As directed      Diet - low sodium heart healthy    Complete by:  As directed      Discharge instructions    Complete by:   As directed   You were treated for heart failure in the hospital.  To prevent exacerbations of your heart failure, it is important that you check your weight at the same time every day, and that if you gain over 2 pounds, OR you develop worsening swelling to the legs, experience more shortness of breath or chest pain, you call your Primary MD immediately.   Follow a heart healthy, low salt diet and restrict your fluid intake to 1200 cc (about 5 cups) a day or less.  Try Ms. Dash for seasoning.     Increase activity slowly    Complete by:  As directed  Medication List    STOP taking these medications        amoxicillin-clavulanate 875-125 MG per tablet  Commonly known as:  AUGMENTIN     fluconazole 100 MG tablet  Commonly known as:  DIFLUCAN     predniSONE 10 MG tablet  Commonly known as:  DELTASONE     predniSONE 50 MG tablet  Commonly known as:  DELTASONE      TAKE these medications        albuterol 108 (90 BASE) MCG/ACT inhaler  Commonly known as:  PROVENTIL HFA;VENTOLIN HFA  Inhale 1-2 puffs into the lungs every 6 (six) hours as needed for wheezing or shortness of breath.     dextromethorphan-guaiFENesin 30-600 MG per 12 hr tablet  Commonly known as:  MUCINEX DM  Take 1 tablet by mouth 2 (two) times daily.     dicyclomine 10 MG capsule  Commonly known as:  BENTYL  TAKE 1 CAPSULE BY MOUTH 2 TO 3 TIMES DAILY for intestinal cramps     furosemide 40 MG tablet  Commonly known as:  LASIX  Take 2 tablets (80 mg total) by mouth 2 (two) times daily. For swelling or fluid retention.     losartan 100 MG tablet  Commonly known as:  COZAAR  Take 1 tablet (100 mg total) by mouth daily.     naproxen sodium 220 MG tablet  Commonly known as:  ANAPROX  Take 220 mg by mouth 2 (two) times daily with a meal.     potassium chloride SA 20 MEQ tablet  Commonly known as:  K-DUR,KLOR-CON  Take 1 tablet (20 mEq total) by mouth 2 (two) times daily.     rifaximin 550 MG  Tabs tablet  Commonly known as:  XIFAXAN  Take 1 tablet (550 mg total) by mouth 2 (two) times daily.     spironolactone 25 MG tablet  Commonly known as:  ALDACTONE  Take 0.5 tablets (12.5 mg total) by mouth daily.     traMADol 50 MG tablet  Commonly known as:  ULTRAM  Take 1 tablet (50 mg total) by mouth every 6 (six) hours as needed (for cough).          Time coordinating discharge: 35 minutes.  Signed:  Naleah Kofoed  Pager [phone] [facility] 04/13/2014, 10:07 AM

## 2014-04-27 ENCOUNTER — Ambulatory Visit (INDEPENDENT_AMBULATORY_CARE_PROVIDER_SITE_OTHER): Payer: No Typology Code available for payment source | Admitting: Physician Assistant

## 2014-04-27 ENCOUNTER — Encounter: Payer: Self-pay | Admitting: Physician Assistant

## 2014-04-27 VITALS — BP 100/70 | HR 100 | Ht 65.5 in | Wt 305.0 lb

## 2014-04-27 DIAGNOSIS — G4733 Obstructive sleep apnea (adult) (pediatric): Secondary | ICD-10-CM

## 2014-04-27 DIAGNOSIS — Z01810 Encounter for preprocedural cardiovascular examination: Secondary | ICD-10-CM

## 2014-04-27 DIAGNOSIS — I10 Essential (primary) hypertension: Secondary | ICD-10-CM | POA: Diagnosis not present

## 2014-04-27 DIAGNOSIS — I5032 Chronic diastolic (congestive) heart failure: Secondary | ICD-10-CM | POA: Diagnosis not present

## 2014-04-27 LAB — BASIC METABOLIC PANEL
BUN: 16 mg/dL (ref 6–23)
CALCIUM: 9.7 mg/dL (ref 8.4–10.5)
CO2: 30 meq/L (ref 19–32)
Chloride: 100 mEq/L (ref 96–112)
Creatinine, Ser: 0.68 mg/dL (ref 0.40–1.20)
GFR: 115.35 mL/min (ref >60.00)
Glucose, Bld: 98 mg/dL (ref 70–99)
POTASSIUM: 3.1 meq/L — AB (ref 3.5–5.1)
SODIUM: 137 meq/L (ref 135–145)

## 2014-04-27 LAB — MAGNESIUM: MAGNESIUM: 1.8 mg/dL (ref 1.5–2.5)

## 2014-04-27 NOTE — Patient Instructions (Addendum)
FOLLOW UP WITH DR. HOCHREIN IN 4 MONTHS  YOU MAY DRINK UP TO 60 OZ OF FLUID PER DAY  LAB WORK TODAY; BMET, MAGNESIUM LEVEL  MAKE SURE TO WEIGH DAILY AND IF YOUR WEIGHT IS UP 3 LB'S IN 1 DAY OR 5 LB'S IN 1 WEEK THEN YOU MAY TAKE AN EXTRA 40 MG LASIX AND CALL THE OFFICE TO LET [place], [place] KNOW [phone]

## 2014-04-27 NOTE — Progress Notes (Signed)
Cardiology Office Note   Date:  04/27/2014   ID:  MICHALLA RINGER, DOB 11-07-1960, MRN [id number]  PCP:  Smothers, Andree Elk, NP  Cardiologist:  Dr. Minus Breeding     Chief Complaint  Patient presents with  . Congestive Heart Failure    follow up     History of Present Illness: [name] is a 54 y.o. female with a hx of diastolic CHF, HTN, HL, sleep apnea. Cardiac catheterization in 2001 demonstrated no significant CAD. She has a history of cough with chronic sinus disease. She has seen pulmonology and has been referred to ENT. I saw her in December 2015 and she was tachycardic and had chest pain.  Labs demonstrated significant hypokalemia which was replaced. DDimer was elevated but CTA of the chest was negative for pulmonary embolism. Echo demonstrated normal LV and RV function.   Admitted in 03/2014 with chest pain in the setting of a/c diastolic HF.  Of note, BNP was normal.  She was diuresed.  She was Discharged on Lasix 80 mg twice a day, spironolactone 12.5 mg daily, K+ 20 mEq Twice daily.  She never FU for a repeat BMET.    Since DC, she has been doing well.  She denies any chest pain or significant DOE. She is able to achieve 4 METs. She sleeps on 2 pillows chronically.  She denies PND.  She sleeps with CPAP nightly.  She denies syncope.  She has noted some LE burning recently.  She has been weighing daily.  Weights have been stable.  She has been restricting her fluids.    Studies/Reports Reviewed Today:  Echo 04/11/14 - Mod LVH. EF 60% to 65%. Wall motion was normal. Grade 2 diastolic dysfunction    Past Medical History  Diagnosis Date  . Common migraine   . Morbid obesity   . Hyperlipidemia   . OSA (obstructive sleep apnea)     CPAP dependent  . Carpal tunnel syndrome   . Hypertension   . Allergic rhinitis   . CAD (coronary artery disease)     LHC (11/29/1999):  EF 60%; no significant CAD.  [name] Chronic diastolic CHF (congestive heart failure)     a. Echo  (01/21/11):  Vigorous LVF, EF 65-70%, no RWMA, Gr 2 DD. ;  b.  Echo (12/15): Moderate LVH, EF 12-75%, grade 2 diastolic dysfunction, mild LAE, normal RV function  . Diverticulitis   . Acute on chronic diastolic CHF (congestive heart failure) 04/11/2014  . Sinusitis, chronic 03/10/2012    CT sinuses 02/2012:  Chronic sinusitis with small A/F levels >> rx by ENT with prolonged augmentin F/u ct sinuses 04/2012:  Persistent sinus thickening >> rx with levaquin and clinda for 30 days.  Told to return for sinus scan in 6 weeks >> no showed for followup visit.  CT sinuses 11/2013:  Acute and chronic sinusitis noted.    [name] PNEUMONIA, ORGAN UNSPECIFIED 03/01/2009    Qualifier: Diagnosis of  By: [name] CMA, [name]    . Acute suppurative otitis media without spontaneous rupture of eardrum 02/04/2007    Centricity Description: OTITIS MEDIA, SUPPURATIVE, ACUTE, BILATERAL Qualifier: Diagnosis of  By: [name] MD, [name]  Centricity Description: OTITIS MEDIA, SUPPURATIVE, ACUTE Qualifier: Diagnosis of  By: [name] MD, [name]   . Carpal tunnel syndrome 06/09/2006    Qualifier: Diagnosis of  By: Larose Kells      Past Surgical History  Procedure Laterality Date  . Tubal ligation    .  Tonsillectomy    . Uvulopalatopharyngoplasty    . Cesarean section    . Nasal sinus surgery    . Partial hysterectomy       Current Outpatient Prescriptions  Medication Sig Dispense Refill  . albuterol (PROVENTIL HFA;VENTOLIN HFA) 108 (90 BASE) MCG/ACT inhaler Inhale 1-2 puffs into the lungs every 6 (six) hours as needed for wheezing or shortness of breath. 1 Inhaler 0  . dextromethorphan-guaiFENesin (MUCINEX DM) 30-600 MG per 12 hr tablet Take 1 tablet by mouth 2 (two) times daily.    [name] dicyclomine (BENTYL) 10 MG capsule TAKE 1 CAPSULE BY MOUTH 2 TO 3 TIMES DAILY for intestinal cramps 90 capsule 0  . furosemide (LASIX) 40 MG tablet Take 2 tablets (80 mg total) by mouth 2 (two) times daily. For swelling or fluid retention. 120  tablet 3  . losartan (COZAAR) 100 MG tablet Take 1 tablet (100 mg total) by mouth daily. 30 tablet 3  . naproxen sodium (ANAPROX) 220 MG tablet Take 220 mg by mouth 2 (two) times daily with a meal.    . potassium chloride SA (K-DUR,KLOR-CON) 20 MEQ tablet Take 1 tablet (20 mEq total) by mouth 2 (two) times daily. 280 tablet 3  . rifaximin (XIFAXAN) 550 MG TABS tablet Take 1 tablet (550 mg total) by mouth 2 (two) times daily. 60 tablet 0  . spironolactone (ALDACTONE) 25 MG tablet Take 0.5 tablets (12.5 mg total) by mouth daily. 30 tablet 3  . traMADol (ULTRAM) 50 MG tablet Take 1 tablet (50 mg total) by mouth every 6 (six) hours as needed (for cough). 30 tablet 0   No current facility-administered medications for this visit.    Allergies:   Doxycycline; Latex; Ciprofloxacin; Fish allergy; Hydrocortisone; Neomycin; and Sulfonamide derivatives    Social History:  The patient  reports that she has never smoked. She has never used smokeless tobacco. She reports that she does not drink alcohol or use illicit drugs.   Family History:  The patient's family history includes Asthma in her sister; Breast cancer in an other family member; Coronary artery disease in an other family member; Heart attack in her mother; Heart disease in her mother; Hypertension in her mother. There is no history of Stroke.    ROS:   Please see the history of present illness.   Review of Systems  Cardiovascular: Positive for chest pain, leg swelling, orthopnea and paroxysmal nocturnal dyspnea.  All other systems reviewed and are negative.    PHYSICAL EXAM: VS:  BP 100/70 mmHg  Pulse 100  Ht 5' 5.5" (1.664 m)  Wt 305 lb (138.347 kg)  BMI 49.96 kg/m2    Wt Readings from Last 3 Encounters:  04/27/14 305 lb (138.347 kg)  04/13/14 310 lb 14.4 oz (141.023 kg)  02/01/14 309 lb (140.161 kg)     GEN: Well nourished, well developed, in no acute distress HEENT: normal Neck: I cannot assess JVD, no masses Cardiac:   Normal S1/S2, RRR; no murmur ,  no rubs or gallops, trace edema  Respiratory:  clear to auscultation bilaterally, no wheezing, rhonchi or rales. GI: soft, nontender, nondistended, + BS MS: no deformity or atrophy Skin: warm and dry  Neuro:  CNs II-XII intact, Strength and sensation are intact Psych: Normal affect   EKG:  EKG is not ordered today.  It demonstrates:   n/a   Recent Labs: 12/31/2013: TSH 2.18 04/11/2014: ALT 33; B Natriuretic Peptide 31.0; Hemoglobin 12.8; Platelets 200 04/13/2014: BUN 17; Creatinine 0.82; Potassium 3.4*;  Sodium 141    Lipid Panel    Component Value Date/Time   CHOL 193 09/17/2007 0920   TRIG 145 09/17/2007 0920   HDL 48.5 09/17/2007 0920   CHOLHDL 4.0 CALC 09/17/2007 0920   VLDL 29 09/17/2007 0920   LDLCALC 116* 09/17/2007 0920      ASSESSMENT AND PLAN:  Chronic diastolic heart failure She is overall stable.  Volume remains stable.  She is doing well with managing her fluids and monitoring her weight.  She will remain on her current dose of Lasix, Spironolactone, K+.  Check BMET, Mg2+ today (complaining of cramps).    Essential hypertension  Controlled.  Continue current regimen.  Obstructive sleep apnea She is adherent to wearing CPAP nightly.  I suspect she has an element of OHS as well.   Pre-operative cardiovascular examination  She needs L TKR in August.  She does not have any unstable cardiac conditions.  The main issue would be to keep a close eye on her volume during surgery.  At this point, she does not require stress testing.     Current medicines are reviewed at length with the patient today.  The patient does not have concerns regarding medicines.  The following changes have been made:  no change  Labs/ tests ordered today include:  Orders Placed This Encounter  Procedures  . Basic Metabolic Panel (BMET)  . Magnesium    Disposition:   FU with Dr. Minus Breeding 4 mos.    Signed, Versie Starks, MHS 04/27/2014 2:46  PM    [facility] [address], [place], [place]  [address] Phone: [phone]; Fax: [phone]

## 2014-04-28 ENCOUNTER — Telehealth: Payer: Self-pay | Admitting: Physician Assistant

## 2014-04-28 DIAGNOSIS — E876 Hypokalemia: Secondary | ICD-10-CM

## 2014-04-28 MED ORDER — POTASSIUM CHLORIDE CRYS ER 20 MEQ PO TBCR: 40.0000 meq | EXTENDED_RELEASE_TABLET | Freq: Two times a day (BID) | ORAL | Status: DC

## 2014-04-28 NOTE — Telephone Encounter (Signed)
Provided lab results and provider notations/medication changes and recommendation. Ordered BMET for next week. Patient verbalized understanding and agreement with changes.

## 2014-04-28 NOTE — Telephone Encounter (Signed)
New Message  Pt returning Rn phone call concerning lab results. Please call back and discuss.

## 2014-04-28 NOTE — Telephone Encounter (Signed)
Left message to call back  

## 2014-05-18 ENCOUNTER — Other Ambulatory Visit: Payer: Self-pay

## 2014-05-18 MED ORDER — SPIRONOLACTONE 25 MG PO TABS
12.5000 mg | ORAL_TABLET | Freq: Every day | ORAL | Status: DC
Start: 2014-05-18 — End: 2015-02-23

## 2014-05-20 ENCOUNTER — Telehealth: Payer: Self-pay | Admitting: Physician Assistant

## 2014-05-20 DIAGNOSIS — I5032 Chronic diastolic (congestive) heart failure: Secondary | ICD-10-CM

## 2014-05-20 NOTE — Telephone Encounter (Signed)
New Message  Pt called to notify Scott's office that she has retained 3 lbs this week. Also to let us know that she has taken extra fluid pill this week. Please call back and discuss.

## 2014-05-23 ENCOUNTER — Other Ambulatory Visit (INDEPENDENT_AMBULATORY_CARE_PROVIDER_SITE_OTHER): Payer: No Typology Code available for payment source | Admitting: *Deleted

## 2014-05-23 DIAGNOSIS — I5032 Chronic diastolic (congestive) heart failure: Secondary | ICD-10-CM

## 2014-05-23 LAB — BASIC METABOLIC PANEL
BUN: 18 mg/dL (ref 6–23)
CO2: 26 mEq/L (ref 19–32)
Calcium: 9.9 mg/dL (ref 8.4–10.5)
Chloride: 100 mEq/L (ref 96–112)
Creatinine, Ser: 1.02 mg/dL (ref 0.40–1.20)
GFR: 72.23 mL/min (ref >60.00)
Glucose, Bld: 140 mg/dL — ABNORMAL HIGH (ref 70–99)
Potassium: 3.8 mEq/L (ref 3.5–5.1)
Sodium: 136 mEq/L (ref 135–145)

## 2014-05-23 MED ORDER — METOLAZONE 2.5 MG PO TABS: 2.5000 mg | ORAL_TABLET | Freq: Every day | ORAL | Status: DC

## 2014-05-23 NOTE — Telephone Encounter (Signed)
Ptcb and said she had gained 2.5-3 lb's per her PCP last week at her physical. She states PCP thought that cardiology may need to put her on another fluid pill. She is on lasix 80 BID, spironolactone 12.5 mg daily. Advised pt Giles off however; I will d/w Truitt Merle, NP who is looking after Scott's things right now. Advised I will cb later today with recommendations. Pt denies any sob, chest pain, she does feel she has swelling. Her weight today is 305.

## 2014-05-23 NOTE — Telephone Encounter (Signed)
BMET today Zaroxolyn 2.5 mg for 2 days BMET in one week. Continue other medicines Salt restriction

## 2014-05-23 NOTE — Telephone Encounter (Signed)
ptcb and has been notified of recommendations per Tera Helper. NP, bmet today, start metolazone 2.5 mg x 2 days, then bmet 5/11. Pt verbalized understanding to plan of care by phone.

## 2014-05-23 NOTE — Telephone Encounter (Signed)
lmptcb on home # , could not lmom on cell voice mail box not set up. Tried to call pt in regards to her call Friday. I did also state I am in the [place] office and one of the nurses in [place] can help.

## 2014-05-23 NOTE — Telephone Encounter (Signed)
lmptcb to go advise of recommendations from Truitt Merle, NP

## 2014-05-26 ENCOUNTER — Telehealth: Payer: Self-pay | Admitting: Internal Medicine

## 2014-06-01 ENCOUNTER — Telehealth: Payer: Self-pay | Admitting: *Deleted

## 2014-06-01 ENCOUNTER — Telehealth: Payer: Self-pay | Admitting: Pulmonary Disease

## 2014-06-01 ENCOUNTER — Other Ambulatory Visit (INDEPENDENT_AMBULATORY_CARE_PROVIDER_SITE_OTHER): Payer: No Typology Code available for payment source

## 2014-06-01 DIAGNOSIS — E876 Hypokalemia: Secondary | ICD-10-CM

## 2014-06-01 LAB — BASIC METABOLIC PANEL
BUN: 16 mg/dL (ref 6–23)
CO2: 29 mEq/L (ref 19–32)
Calcium: 9.5 mg/dL (ref 8.4–10.5)
Chloride: 102 mEq/L (ref 96–112)
Creatinine, Ser: 0.85 mg/dL (ref 0.40–1.20)
GFR: 89.13 mL/min (ref >60.00)
Glucose, Bld: 98 mg/dL (ref 70–99)
POTASSIUM: 3.8 meq/L (ref 3.5–5.1)
Sodium: 138 mEq/L (ref 135–145)

## 2014-06-01 NOTE — Telephone Encounter (Signed)
lmtcb X1 to relay recs to patient.

## 2014-06-01 NOTE — Telephone Encounter (Signed)
Pt notified about lab results by phone with verbal understanding.

## 2014-06-01 NOTE — Telephone Encounter (Signed)
Spoke with pt  She rescheduled ov with [facility] for surgery clearance for 06/23/14  Surgery scheduled for 09/13/14- knee replacement She is asking if [facility] thinks her appt with him is too far away from her surgery date  She is also wanting to know what [facility] will be referring her to since he is leaving  Please advise thanks

## 2014-06-01 NOTE — Telephone Encounter (Signed)
Her scheduled visit is fine. Will discuss with her at visit who to see for followup

## 2014-06-03 ENCOUNTER — Ambulatory Visit: Payer: No Typology Code available for payment source | Admitting: Pulmonary Disease

## 2014-06-03 NOTE — Telephone Encounter (Signed)
Pt aware. Nothing further needed 

## 2014-06-10 ENCOUNTER — Telehealth: Payer: Self-pay | Admitting: Physician Assistant

## 2014-06-10 NOTE — Telephone Encounter (Signed)
Follow Up  Pt c/o medication issue:  1. Name of Medication: Metoprolol 2.5mg   2. How are you currently taking this medication (dosage and times per day)? Pt was only given the med one time   3. Are you having a reaction (difficulty breathing--STAT)?   4. What is your medication issue? Pt called states that she has gained 5 lbs. And was told to call in if this happens again. Pt also states that she is only given 2 pills at a time and she would like to have that increased. Please call    Pt states that the name of her pharmacy was [facility] on [address] the last time [facility] was [facility].

## 2014-06-10 NOTE — Telephone Encounter (Signed)
I cb pt about her weight gain of 5 #. Pt states this has been since yesterday and she took the extra lasix 40 mg yesterday and today. I asked pt why did she not call yesterday, she said she wanted to se eif edema would go down. I advised pt she has HF and that we needed to know this yesterday. I stated dr gone for the weekend already. I advised pt to go to the ED or to Urgent care since this is a significant weight gain. I did schedule pt to see Brynda Rim. PA 06/13/14 @ 9:30, I offered 12:10 though pt did not think she could do that and needed earlier time. I stressed the importance of going to the ED or Urgent Care tonight. Pt said she might not have a ride since her son is not home. I then advised pt to call the On-Call Provider for recommendations if she cannot get a ride to the ED. Pt agreeable to plan of care. I stated that I will d/w Brynda Rim. PA as well.

## 2014-06-12 NOTE — Progress Notes (Signed)
Cardiology Office Note   Date:  06/13/2014   ID:  [name], DOB 05-Dec-1960, MRN [id number]  PCP:  Smothers, Andree Elk, NP  Cardiologist:  Dr. Minus Breeding     Chief Complaint  Patient presents with  . Congestive Heart Failure     History of Present Illness: [name] is a 54 y.o. female with a hx of diastolic CHF, HTN, HL, sleep apnea. Cardiac catheterization in 2001 demonstrated no significant CAD. She has a history of cough with chronic sinus disease. She has seen pulmonology and has been referred to ENT. I saw her in December 2015 and she was tachycardic and had chest pain.  Labs demonstrated significant hypokalemia which was replaced. DDimer was elevated but CTA of the chest was negative for pulmonary embolism. Echo demonstrated normal LV and RV function.   Admitted in 03/2014 with chest pain in the setting of a/c diastolic HF.  Of note, BNP was normal.  She was diuresed.  She was Discharged on Lasix 80 mg twice a day, spironolactone 12.5 mg daily, K+ 20 mEq Twice daily.    She called in recently with a 5 lb weight gain.  She comes in for evaluation.  She increased Lasix on her own to 120 mg twice a day. Her weight is now back to baseline. She resumed her 80 mg dose Twice daily today.  Prior to increasing Lasix, her breathing was worse and she noted orthopnea as well as LE edema. Her breathing is now improved and she is NYHA 2b. Most of her physical limitations are related to knee arthritis. She did note a cough with white sputum. This is resolved. She denies fever or hemoptysis. She sleeps with CPAP. She denies syncope. She denies chest discomfort.  She is still planning on getting a knee replacement in August.    Studies/Reports Reviewed Today:  Echo 04/11/14 - Mod LVH. EF 60% to 65%. Wall motion was normal. Grade 2 diastolic dysfunction    Past Medical History  Diagnosis Date  . Common migraine   . Morbid obesity   . Hyperlipidemia   . OSA (obstructive  sleep apnea)     CPAP dependent  . Carpal tunnel syndrome   . Hypertension   . Allergic rhinitis   . CAD (coronary artery disease)     LHC (11/29/1999):  EF 60%; no significant CAD.  [name] Chronic diastolic CHF (congestive heart failure)     a. Echo (01/21/11):  Vigorous LVF, EF 65-70%, no RWMA, Gr 2 DD. ;  b.  Echo (12/15): Moderate LVH, EF 19-37%, grade 2 diastolic dysfunction, mild LAE, normal RV function  . Diverticulitis   . Acute on chronic diastolic CHF (congestive heart failure) 04/11/2014  . Sinusitis, chronic 03/10/2012    CT sinuses 02/2012:  Chronic sinusitis with small A/F levels >> rx by ENT with prolonged augmentin F/u ct sinuses 04/2012:  Persistent sinus thickening >> rx with levaquin and clinda for 30 days.  Told to return for sinus scan in 6 weeks >> no showed for followup visit.  CT sinuses 11/2013:  Acute and chronic sinusitis noted.    [name] PNEUMONIA, ORGAN UNSPECIFIED 03/01/2009    Qualifier: Diagnosis of  By: [name] CMA, [name]    . Acute suppurative otitis media without spontaneous rupture of eardrum 02/04/2007    Centricity Description: OTITIS MEDIA, SUPPURATIVE, ACUTE, BILATERAL Qualifier: Diagnosis of  By: [name] MD, [name]  Centricity Description: OTITIS MEDIA, SUPPURATIVE, ACUTE Qualifier: Diagnosis of  By: [name] MD,  Sean A   . Carpal tunnel syndrome 06/09/2006    Qualifier: Diagnosis of  By: Larose Kells      Past Surgical History  Procedure Laterality Date  . Tubal ligation    . Tonsillectomy    . Uvulopalatopharyngoplasty    . Cesarean section    . Nasal sinus surgery    . Partial hysterectomy       Current Outpatient Prescriptions  Medication Sig Dispense Refill  . albuterol (PROVENTIL HFA;VENTOLIN HFA) 108 (90 BASE) MCG/ACT inhaler Inhale 1-2 puffs into the lungs every 6 (six) hours as needed for wheezing or shortness of breath. 1 Inhaler 0  . calcium carbonate (TUMS - DOSED IN MG ELEMENTAL CALCIUM) 500 MG chewable tablet Chew 1 tablet by mouth daily.     [name] dextromethorphan-guaiFENesin (MUCINEX DM) 30-600 MG per 12 hr tablet Take 1 tablet by mouth 2 (two) times daily.    [name] dicyclomine (BENTYL) 10 MG capsule TAKE 1 CAPSULE BY MOUTH 2 TO 3 TIMES DAILY for intestinal cramps 90 capsule 0  . Docusate Calcium (STOOL SOFTENER PO) Take 1 tablet by mouth as needed.    . famotidine (PEPCID) 20 MG tablet Take 20 mg by mouth at bedtime.    . furosemide (LASIX) 40 MG tablet Take 2 tablets (80 mg total) by mouth 2 (two) times daily. For swelling or fluid retention. 120 tablet 3  . losartan (COZAAR) 100 MG tablet Take 1 tablet (100 mg total) by mouth daily. 30 tablet 3  . metolazone (ZAROXOLYN) 2.5 MG tablet Take 1 tablet (2.5 mg total) by mouth daily as needed (take only for weight increase that does improve with extra Lasix). Take 30 minutes before morning lasix 30 tablet 1  . naproxen sodium (ANAPROX) 220 MG tablet Take 220 mg by mouth 2 (two) times daily with a meal.    . potassium chloride SA (KLOR-CON M20) 20 MEQ tablet Take 5 tablets (100 mEq total) by mouth daily.    . rifaximin (XIFAXAN) 550 MG TABS tablet Take 1 tablet (550 mg total) by mouth 2 (two) times daily. 60 tablet 0  . spironolactone (ALDACTONE) 25 MG tablet Take 0.5 tablets (12.5 mg total) by mouth daily. 45 tablet 3  . traMADol (ULTRAM) 50 MG tablet Take 1 tablet (50 mg total) by mouth every 6 (six) hours as needed (for cough). 30 tablet 0   No current facility-administered medications for this visit.    Allergies:   Doxycycline; Latex; Ciprofloxacin; Fish allergy; Hydrocortisone; Neomycin; and Sulfonamide derivatives    Social History:  The patient  reports that she has never smoked. She has never used smokeless tobacco. She reports that she does not drink alcohol or use illicit drugs.   Family History:  The patient's family history includes Asthma in her sister; Breast cancer in an other family member; Coronary artery disease in an other family member; Heart attack in her mother; Heart  disease in her mother; Hypertension in her mother. There is no history of Stroke.    ROS:   Please see the history of present illness.   Review of Systems  Constitution: Positive for weight gain.  Cardiovascular: Positive for leg swelling.  Respiratory: Positive for cough and wheezing.   Musculoskeletal: Positive for joint pain.  All other systems reviewed and are negative.    PHYSICAL EXAM: VS:  BP 124/64 mmHg  Pulse 71  Ht 5' 5.5" (1.664 m)  Wt 305 lb 12.8 oz (138.71 kg)  BMI 50.10 kg/m2  Wt Readings from Last 3 Encounters:  06/13/14 305 lb 12.8 oz (138.71 kg)  04/27/14 305 lb (138.347 kg)  04/13/14 310 lb 14.4 oz (141.023 kg)     GEN: Well nourished, well developed, in no acute distress HEENT: normal Neck: I cannot assess JVD, no masses Cardiac:  Normal S1/S2, RRR; no murmur ,  no rubs or gallops, very trace edema  Respiratory:  clear to auscultation bilaterally, no wheezing, rhonchi or rales. GI: soft, nontender, nondistended, + BS MS: no deformity or atrophy Skin: warm and dry  Neuro:  CNs II-XII intact, Strength and sensation are intact Psych: Normal affect   EKG:  EKG is ordered today.  It demonstrates:   NSR, HR 71, normal axis, NSSTTW changes.    Recent Labs: 12/31/2013: TSH 2.18 04/11/2014: ALT 33; B Natriuretic Peptide 31.0; Hemoglobin 12.8; Platelets 200 04/27/2014: Magnesium 1.8 06/13/2014: BUN 15; Creatinine 0.69; Potassium 3.5; Sodium 139    Lipid Panel    Component Value Date/Time   CHOL 193 09/17/2007 0920   TRIG 145 09/17/2007 0920   HDL 48.5 09/17/2007 0920   CHOLHDL 4.0 CALC 09/17/2007 0920   VLDL 29 09/17/2007 0920   LDLCALC 116* 09/17/2007 0920      ASSESSMENT AND PLAN:  Chronic diastolic heart failure Volume stable.  She correctly took extra Lasix and she is back to baseline.  Check BMET today.  We discussed how to take extra Lasix for weight gain.  She had better results with Metolazone in the past.  If she does not get significant  weight reduction with extra Lasix, she may take 1 dose of Metolazone 2.5 mg.  She should call us when she does this given her hx of hypokalemia.     Essential hypertension  Controlled.  Continue current regimen.   Obstructive sleep apnea She is adherent to wearing CPAP nightly.  I suspect she has an element of OHS as well.    Surgical Clearance:  She does not have any unstable cardiac conditions.  She may proceed with her knee surgery at acceptable risk. Close attention will need to be paid to volume status in the peri-operative period.     Current medicines are reviewed at length with the patient today.  Any concerns are as outlined above. The following changes have been made:    Metolazone 2.5 mg as needed  Labs/ tests ordered today include:  Orders Placed This Encounter  Procedures  . Basic metabolic panel  . EKG 12-Lead    Disposition:   FU Dr. Minus Breeding as planned.    Signed, [name], MHS 06/13/2014 10:03 PM    [facility] [address], [place], [place]  [address] Phone: [phone]; Fax: [phone]

## 2014-06-13 ENCOUNTER — Ambulatory Visit (INDEPENDENT_AMBULATORY_CARE_PROVIDER_SITE_OTHER): Payer: No Typology Code available for payment source | Admitting: Physician Assistant

## 2014-06-13 ENCOUNTER — Encounter: Payer: Self-pay | Admitting: Physician Assistant

## 2014-06-13 ENCOUNTER — Telehealth: Payer: Self-pay | Admitting: *Deleted

## 2014-06-13 ENCOUNTER — Other Ambulatory Visit: Payer: Self-pay | Admitting: Internal Medicine

## 2014-06-13 VITALS — BP 124/64 | HR 71 | Ht 65.5 in | Wt 305.8 lb

## 2014-06-13 DIAGNOSIS — I5032 Chronic diastolic (congestive) heart failure: Secondary | ICD-10-CM | POA: Diagnosis not present

## 2014-06-13 DIAGNOSIS — I10 Essential (primary) hypertension: Secondary | ICD-10-CM | POA: Diagnosis not present

## 2014-06-13 DIAGNOSIS — G4733 Obstructive sleep apnea (adult) (pediatric): Secondary | ICD-10-CM

## 2014-06-13 LAB — BASIC METABOLIC PANEL
BUN: 15 mg/dL (ref 6–23)
CALCIUM: 9.5 mg/dL (ref 8.4–10.5)
CHLORIDE: 105 meq/L (ref 96–112)
CO2: 27 mEq/L (ref 19–32)
CREATININE: 0.69 mg/dL (ref 0.40–1.20)
GFR: 113.37 mL/min (ref >60.00)
Glucose, Bld: 107 mg/dL — ABNORMAL HIGH (ref 70–99)
POTASSIUM: 3.5 meq/L (ref 3.5–5.1)
SODIUM: 139 meq/L (ref 135–145)

## 2014-06-13 MED ORDER — POTASSIUM CHLORIDE CRYS ER 20 MEQ PO TBCR: 100.0000 meq | EXTENDED_RELEASE_TABLET | Freq: Every day | ORAL | Status: DC

## 2014-06-13 MED ORDER — METOLAZONE 2.5 MG PO TABS: 2.5000 mg | ORAL_TABLET | Freq: Every day | ORAL | Status: DC | PRN

## 2014-06-13 NOTE — Telephone Encounter (Signed)
See office note from today. Richardson Dopp, PA-C   06/13/2014 5:06 PM

## 2014-06-13 NOTE — Patient Instructions (Signed)
Medication Instructions:  Weigh daily.  If your weight is up 3 lbs or more in one day, take an extra Lasix that day.  If your weight is not back down the next day, take a Metolazone 2.5 mg 30 minutes before the morning Lasix.  If your weight does not come back down, call us.  Labwork: BMET today.  Testing/Procedures: None   Follow-Up: Schedule follow up with Dr. Minus Breeding in July or August.  Any Other Special Instructions Will Be Listed Below (If Applicable).

## 2014-06-13 NOTE — Telephone Encounter (Signed)
lmom lab results ok. If any questions cb [phone].

## 2014-06-14 ENCOUNTER — Telehealth: Payer: Self-pay | Admitting: Cardiology

## 2014-06-14 NOTE — Telephone Encounter (Signed)
Returned call to patient Dr.Hochrein advised does not need antibiotic before dental extractions note faxed to [facility] at fax # [phone].

## 2014-06-14 NOTE — Telephone Encounter (Signed)
Pt. Called in stated she needs an antibiotic because shea having a tooth extracted, please advise

## 2014-06-14 NOTE — Telephone Encounter (Signed)
Pt called in stating that a form needs to be faxed to [facility] indicating that she does not need pre meds prior to having her toothe extracted. The fax number is [phone] and the pt would like to be contacted once this fax has been sent.  Thank you

## 2014-06-14 NOTE — Telephone Encounter (Signed)
She does not need an antibiotic. 

## 2014-06-14 NOTE — Telephone Encounter (Signed)
Pt called in stating that she will be needing an antibiotic because she will be having a tooth extracted on 5/31. Please f/u with the pt   Thanks

## 2014-06-14 NOTE — Telephone Encounter (Signed)
Pt. Informed she didn't need an antibiotic

## 2014-06-16 ENCOUNTER — Telehealth: Payer: Self-pay | Admitting: Physician Assistant

## 2014-06-16 ENCOUNTER — Other Ambulatory Visit: Payer: Self-pay | Admitting: Internal Medicine

## 2014-06-16 NOTE — Telephone Encounter (Signed)
Walk in pt form-[facility]-paper dropped off Carol/Scott back Friday ( 5.27.16) 5.26.16/km

## 2014-06-21 ENCOUNTER — Telehealth: Payer: Self-pay | Admitting: Internal Medicine

## 2014-06-21 MED ORDER — DICYCLOMINE HCL 10 MG PO CAPS: 10.0000 mg | ORAL_CAPSULE | Freq: Three times a day (TID) | ORAL | Status: DC

## 2014-06-21 NOTE — Telephone Encounter (Signed)
Script refilled. Colon needs to be done at [facility]. Left message for pt that I will look at days available and call her back.

## 2014-06-23 ENCOUNTER — Ambulatory Visit: Payer: No Typology Code available for payment source | Admitting: Pulmonary Disease

## 2014-07-07 ENCOUNTER — Telehealth: Payer: Self-pay | Admitting: *Deleted

## 2014-07-07 NOTE — Telephone Encounter (Signed)
Requesting surgical clearance:   1. Type of surgery: Left partial vs total knee replacement  2. Surgeon: Edmonia Lynch  3. Surgical date: 09/13/2014  4. Medications that need to be help: None   Please refer to [facility] office note for clearance  Scott's note from 06/13/14 faxed to Dr Percell Miller and Claiborne Billings at [phone]

## 2014-07-26 NOTE — Telephone Encounter (Signed)
She should have followed up in the office. Have her do that (no overbook, no urgency). We can discuss routine colon cancer screening options, particularly given her high risk body habitus.

## 2014-07-26 NOTE — Telephone Encounter (Signed)
Left message for pt to call back  °

## 2014-07-26 NOTE — Telephone Encounter (Signed)
Pts last colon done 10/2003, recall needs to be done at hospital. Dr. Henrene Pastor is it ok to schedule pt on 08/09/14@[facility] . Please advise.

## 2014-07-27 NOTE — Telephone Encounter (Signed)
Pt scheduled to see Dr. Henrene Pastor 09/21/14@8 :30am. Pt aware of appt.

## 2014-08-16 ENCOUNTER — Ambulatory Visit: Payer: No Typology Code available for payment source | Admitting: Emergency Medicine

## 2014-08-29 ENCOUNTER — Ambulatory Visit: Payer: No Typology Code available for payment source | Admitting: Cardiology

## 2014-08-31 ENCOUNTER — Other Ambulatory Visit: Payer: Self-pay | Admitting: Internal Medicine

## 2014-08-31 ENCOUNTER — Telehealth: Payer: Self-pay | Admitting: Internal Medicine

## 2014-08-31 ENCOUNTER — Other Ambulatory Visit: Payer: Self-pay | Admitting: Cardiology

## 2014-08-31 DIAGNOSIS — I5032 Chronic diastolic (congestive) heart failure: Secondary | ICD-10-CM

## 2014-08-31 DIAGNOSIS — I10 Essential (primary) hypertension: Secondary | ICD-10-CM

## 2014-08-31 MED ORDER — DICYCLOMINE HCL 10 MG PO CAPS: 10.0000 mg | ORAL_CAPSULE | Freq: Three times a day (TID) | ORAL | Status: DC

## 2014-08-31 NOTE — Telephone Encounter (Signed)
Please advise, patient knows that she needs an appointment before medication is refilled.

## 2014-08-31 NOTE — Telephone Encounter (Signed)
Lm on vm - sent Bentyl to [facility]

## 2014-08-31 NOTE — Telephone Encounter (Signed)
°  1. Which medications need to be refilled? Furosemide-pt said she saw Richardson Dopp in [place] said she could not get her medicine,until she see Dr Abbey Chatters needs her medicine,she has CHF  2. Which pharmacy is medication to be sent to?[facility]-[phone]  3. Do they need a 30 day or 90 day supply? #120 and refills  4. Would they like a call back once the medication has been sent to the pharmacy? yes

## 2014-09-01 MED ORDER — FUROSEMIDE 40 MG PO TABS: 80.0000 mg | ORAL_TABLET | Freq: Two times a day (BID) | ORAL | Status: DC

## 2014-09-01 NOTE — Telephone Encounter (Signed)
Pt stated that she is not in [place] right now and can not make an appt at this time .

## 2014-09-01 NOTE — Telephone Encounter (Signed)
°  1. Which medications need to be refilled? Furosemide   2. Which pharmacy is medication to be sent to?[facility] on [address]   3. Do they need a 30 day or 90 day supply? 90  4. Would they like a call back once the medication has been sent to the pharmacy? Yes

## 2014-09-01 NOTE — Telephone Encounter (Signed)
refill sent to the pharmacy electronically

## 2014-09-02 ENCOUNTER — Inpatient Hospital Stay (HOSPITAL_COMMUNITY)
Admission: RE | Admit: 2014-09-02 | Discharge: 2014-09-02 | Disposition: A | Discharge status: Home / Self Care | Payer: No Typology Code available for payment source | Source: Ambulatory Visit

## 2014-09-07 ENCOUNTER — Encounter: Payer: Self-pay | Admitting: Cardiology

## 2014-09-08 ENCOUNTER — Telehealth: Payer: Self-pay | Admitting: Internal Medicine

## 2014-09-09 NOTE — Telephone Encounter (Signed)
Duplicate messgae

## 2014-09-09 NOTE — Telephone Encounter (Signed)
Pt was given xifaxan one time by GI and she took it. She asked her PCP to prescribe it another time. Pt called the other day for a refill on her bentyl because she was having some discomfort on her left side. Pt states she had one xifaxan pill left from her PCP and she took it and her stomach stopped hurting. Discussed with pt that Xifaxan is used to treat diarrhea. Asked pt is she is having diarrhea and she states when she goes to the bathroom it is diarrhea. Dr. Henrene Pastor do you want to refill this Xifaxan? Please advise.

## 2014-09-09 NOTE — Telephone Encounter (Signed)
Refill one time would be okay if she feels this helps her.

## 2014-09-12 MED ORDER — RIFAXIMIN 550 MG PO TABS: 550.0000 mg | ORAL_TABLET | Freq: Two times a day (BID) | ORAL | Status: DC

## 2014-09-12 NOTE — Telephone Encounter (Signed)
Xifaxan refilled per Dr. Henrene Pastor

## 2014-09-13 ENCOUNTER — Encounter (HOSPITAL_COMMUNITY): Admission: RE | Payer: Self-pay | Source: Ambulatory Visit

## 2014-09-13 ENCOUNTER — Inpatient Hospital Stay (HOSPITAL_COMMUNITY)
Admission: RE | Admit: 2014-09-13 | Payer: No Typology Code available for payment source | Source: Ambulatory Visit | Admitting: Orthopedic Surgery

## 2014-09-13 SURGERY — ARTHROPLASTY, KNEE, UNICOMPARTMENTAL
Anesthesia: General | Site: Knee | Laterality: Left

## 2014-09-20 ENCOUNTER — Inpatient Hospital Stay: Admit: 2014-09-20 | Payer: Self-pay | Admitting: Orthopedic Surgery

## 2014-09-20 SURGERY — ARTHROPLASTY, KNEE, TOTAL
Anesthesia: General | Laterality: Left

## 2014-09-21 ENCOUNTER — Ambulatory Visit: Payer: No Typology Code available for payment source | Admitting: Internal Medicine

## 2014-10-13 ENCOUNTER — Other Ambulatory Visit: Payer: Self-pay | Admitting: Cardiology

## 2014-10-13 MED ORDER — LOSARTAN POTASSIUM 100 MG PO TABS: 100.0000 mg | ORAL_TABLET | Freq: Every day | ORAL | Status: DC

## 2014-10-13 NOTE — Telephone Encounter (Signed)
°  1. Which medications need to be refilled? Losartan   2. Which pharmacy is [facility] to [facility] [address]   3. Do they need a 30 day or 90 day supply? 90  4. Would they like a call back once the medication has been sent to the pharmacy? Yes

## 2014-10-13 NOTE — Telephone Encounter (Signed)
Refill submitted to patient's preferred pharmacy. Pt called, VM not set up, unable to LM.

## 2014-10-24 ENCOUNTER — Telehealth: Payer: Self-pay | Admitting: Internal Medicine

## 2014-10-24 ENCOUNTER — Ambulatory Visit: Payer: No Typology Code available for payment source | Admitting: Internal Medicine

## 2014-10-24 NOTE — Telephone Encounter (Signed)
No charge. 

## 2014-10-27 ENCOUNTER — Ambulatory Visit: Payer: No Typology Code available for payment source | Admitting: Cardiology

## 2014-11-02 ENCOUNTER — Other Ambulatory Visit: Payer: Self-pay | Admitting: Cardiology

## 2014-11-07 ENCOUNTER — Telehealth: Payer: Self-pay | Admitting: Cardiology

## 2014-11-07 ENCOUNTER — Other Ambulatory Visit: Payer: Self-pay

## 2014-11-07 MED ORDER — METOLAZONE 2.5 MG PO TABS: 2.5000 mg | ORAL_TABLET | Freq: Every day | ORAL | Status: DC | PRN

## 2014-11-07 NOTE — Telephone Encounter (Signed)
  STAT if patient is at the pharmacy , call can be transferred to refill team.   1. Which medications need to be refilled? metolaovone   2. Which pharmacy/location is medication to be sent to? [facility] on [address]  3. Do they need a 30 day or 90 day supply? 30 day

## 2014-11-12 ENCOUNTER — Other Ambulatory Visit: Payer: Self-pay | Admitting: Interventional Cardiology

## 2014-11-29 ENCOUNTER — Ambulatory Visit (INDEPENDENT_AMBULATORY_CARE_PROVIDER_SITE_OTHER): Payer: No Typology Code available for payment source | Admitting: Cardiology

## 2014-11-29 ENCOUNTER — Encounter: Payer: Self-pay | Admitting: Cardiology

## 2014-11-29 ENCOUNTER — Telehealth: Payer: Self-pay | Admitting: Cardiology

## 2014-11-29 VITALS — BP 158/94 | HR 65 | Ht 63.0 in | Wt 313.5 lb

## 2014-11-29 DIAGNOSIS — R0602 Shortness of breath: Secondary | ICD-10-CM

## 2014-11-29 DIAGNOSIS — Z79899 Other long term (current) drug therapy: Secondary | ICD-10-CM | POA: Diagnosis not present

## 2014-11-29 LAB — BASIC METABOLIC PANEL
BUN: 13 mg/dL (ref 7–25)
CALCIUM: 9.4 mg/dL (ref 8.6–10.4)
CO2: 27 mmol/L (ref 20–31)
Chloride: 106 mmol/L (ref 98–110)
Creat: 0.57 mg/dL (ref 0.50–1.05)
Glucose, Bld: 89 mg/dL (ref 65–99)
POTASSIUM: 3.8 mmol/L (ref 3.5–5.3)
SODIUM: 145 mmol/L (ref 135–146)

## 2014-11-29 MED ORDER — METOLAZONE 2.5 MG PO TABS: 2.5000 mg | ORAL_TABLET | Freq: Every day | ORAL | Status: DC | PRN

## 2014-11-29 NOTE — Patient Instructions (Addendum)
Your physician recommends that you schedule a follow-up appointment in: 1 Month with [place] physician recommends that you return for lab work BMP Today  Your physician has recommended you make the following change in your medication: TAKE Matolazone 2.5 mg taday and Friday, also take an extra tablet of Potassium while taking Metolazone

## 2014-11-29 NOTE — Progress Notes (Signed)
HPI  Pt presents for follow up of follow-up shortness of breath. She has a history of diastolic dysfunction. She was hospitalized in March for this. She has had increase in her weight and she wanted some Zaroxolyn but didn't have a renewal on this. She doesn't weigh herself daily. She does watch her salt however. Her weights are up about 8 pounds since her last visit. She does have some mild increased shortness of breath with this. She's had some increased lower extremity swelling. She denies any chest pressure, neck or arm discomfort. She doesn't any palpitations, presyncope or syncope. He's had no PND or orthopnea.  Allergies  Allergen Reactions  . Doxycycline Nausea And Vomiting  . Latex Hives  . Ciprofloxacin Rash  . Fish Allergy Rash    Only reaction to Mackerel.  No other issues with any type of fish or shellfish currently   . Hydrocortisone Rash  . Neomycin Rash  . Sulfonamide Derivatives Swelling and Rash    Current Outpatient Prescriptions  Medication Sig Dispense Refill  . albuterol (PROVENTIL HFA;VENTOLIN HFA) 108 (90 BASE) MCG/ACT inhaler Inhale 1-2 puffs into the lungs every 6 (six) hours as needed for wheezing or shortness of breath. 1 Inhaler 0  . calcium carbonate (TUMS - DOSED IN MG ELEMENTAL CALCIUM) 500 MG chewable tablet Chew 1 tablet by mouth daily.    [name] dextromethorphan-guaiFENesin (MUCINEX DM) 30-600 MG per 12 hr tablet Take 1 tablet by mouth 2 (two) times daily.    Mariane Baumgarten Calcium (STOOL SOFTENER PO) Take 1 tablet by mouth as needed.    . famotidine (PEPCID) 20 MG tablet Take 20 mg by mouth at bedtime.    . furosemide (LASIX) 40 MG tablet Take 2 tablets (80 mg total) by mouth 2 (two) times daily. For swelling or fluid retention. 360 tablet 3  . losartan (COZAAR) 100 MG tablet Take 1 tablet (100 mg total) by mouth daily. 90 tablet 1  . naproxen sodium (ANAPROX) 220 MG tablet Take 220 mg by mouth 2 (two) times daily with a meal.    . potassium chloride SA  (KLOR-CON M20) 20 MEQ tablet Take 5 tablets (100 mEq total) by mouth daily.    [name] spironolactone (ALDACTONE) 25 MG tablet Take 0.5 tablets (12.5 mg total) by mouth daily. 45 tablet 3   No current facility-administered medications for this visit.    Past Medical History  Diagnosis Date  . Common migraine   . Morbid obesity   . Hyperlipidemia   . OSA (obstructive sleep apnea)     CPAP dependent  . Carpal tunnel syndrome   . Hypertension   . Allergic rhinitis   . CAD (coronary artery disease)     LHC (11/29/1999):  EF 60%; no significant CAD.  [name] Chronic diastolic CHF (congestive heart failure)     a. Echo (01/21/11):  Vigorous LVF, EF 65-70%, no RWMA, Gr 2 DD. ;  b.  Echo (12/15): Moderate LVH, EF 39-76%, grade 2 diastolic dysfunction, mild LAE, normal RV function  . Diverticulitis   . Acute on chronic diastolic CHF (congestive heart failure) 04/11/2014  . Sinusitis, chronic 03/10/2012    CT sinuses 02/2012:  Chronic sinusitis with small A/F levels >> rx by ENT with prolonged augmentin F/u ct sinuses 04/2012:  Persistent sinus thickening >> rx with levaquin and clinda for 30 days.  Told to return for sinus scan in 6 weeks >> no showed for followup visit.  CT sinuses 11/2013:  Acute and chronic sinusitis  noted.    [name] PNEUMONIA, ORGAN UNSPECIFIED 03/01/2009    Qualifier: Diagnosis of  By: [name] CMA, [name]    . Acute suppurative otitis media without spontaneous rupture of eardrum 02/04/2007    Centricity Description: OTITIS MEDIA, SUPPURATIVE, ACUTE, BILATERAL Qualifier: Diagnosis of  By: [name] MD, [name]  Centricity Description: OTITIS MEDIA, SUPPURATIVE, ACUTE Qualifier: Diagnosis of  By: [name] MD, [name]   . Carpal tunnel syndrome 06/09/2006    Qualifier: Diagnosis of  By: Marca Ancona RMA, Lucy    . IBS (irritable bowel syndrome)   . Diastolic heart failure   . Hepatic steatosis   . Hiatal hernia   . Hepatomegaly   . Internal hemorrhoids     Past Surgical History  Procedure Laterality  Date  . Tubal ligation    . Tonsillectomy    . Uvulopalatopharyngoplasty    . Cesarean section    . Nasal sinus surgery    . Partial hysterectomy      ROS:  As stated in the HPI and negative for all other systems.  PHYSICAL EXAM BP 158/94 mmHg  Pulse 65  Ht 5\' 3"  (1.6 m)  Wt 313 lb 8 oz (142.203 kg)  BMI 55.55 kg/m2 GENERAL:  Well appearing NECK:  No jugular venous distention, waveform within normal limits, carotid upstroke brisk and symmetric, no bruits, no thyromegaly LUNGS:  Clear to auscultation bilaterally BACK:  No CVA tenderness CHEST:  Unremarkable HEART:  PMI not displaced or sustained,S1 and S2 within normal limits, no S3, no S4, no clicks, no rubs, no murmurs ABD:  Obese, positive bowel sounds normal in frequency in pitch, no bruits, no rebound, no guarding, no midline pulsatile mass, no hepatomegaly, no splenomegaly EXT:  2 plus pulses throughout, mild /traceedema, no cyanosis no clubbing   EKG:  Sinus rhythm, rate 65, right axis deviation, probable old anteroseptal microinfarction, intervals within normal limits, no acute ST-T wave changes. 11/29/2014   ASSESSMENT AND PLAN   Chronic diastolic heart failure We had a long discussion about this. She's not try to start weighing herself daily. Give her Zaroxolyn 2.5 mg today and again on her next day off school which is Friday. She'll check an basic metabolic profile today. He is to take an extra potassium every time she takes a Futures trader. She's been continue to remain strict salt and let me know if her weights don't come down.  Essential hypertension  Her blood pressure is slightly elevated however, this is unusual and we will follow this  Obstructive sleep apnea She is managed for this.

## 2014-11-29 NOTE — Telephone Encounter (Signed)
Pt explains insurance would not fill further if not written for 90-day dispense - - [facility] - I submitted #90 refill on metolazone. No further needs.  Did instruct pt to call if any further issues or separate concerns.

## 2014-11-29 NOTE — Telephone Encounter (Signed)
Please call, concerning her Matolazone.

## 2014-11-30 ENCOUNTER — Ambulatory Visit: Payer: No Typology Code available for payment source | Admitting: Internal Medicine

## 2014-12-02 ENCOUNTER — Telehealth: Payer: Self-pay | Admitting: Cardiology

## 2014-12-02 ENCOUNTER — Emergency Department (HOSPITAL_COMMUNITY)
Admission: EM | Admit: 2014-12-02 | Discharge: 2014-12-02 | Disposition: A | Discharge status: Home / Self Care | Payer: No Typology Code available for payment source | Attending: Emergency Medicine | Admitting: Emergency Medicine

## 2014-12-02 ENCOUNTER — Encounter (HOSPITAL_COMMUNITY): Payer: Self-pay | Admitting: Emergency Medicine

## 2014-12-02 DIAGNOSIS — Z8719 Personal history of other diseases of the digestive system: Secondary | ICD-10-CM | POA: Diagnosis not present

## 2014-12-02 DIAGNOSIS — Z9981 Dependence on supplemental oxygen: Secondary | ICD-10-CM | POA: Diagnosis not present

## 2014-12-02 DIAGNOSIS — I5033 Acute on chronic diastolic (congestive) heart failure: Secondary | ICD-10-CM | POA: Insufficient documentation

## 2014-12-02 DIAGNOSIS — Z79899 Other long term (current) drug therapy: Secondary | ICD-10-CM | POA: Insufficient documentation

## 2014-12-02 DIAGNOSIS — Z8701 Personal history of pneumonia (recurrent): Secondary | ICD-10-CM | POA: Diagnosis not present

## 2014-12-02 DIAGNOSIS — J329 Chronic sinusitis, unspecified: Secondary | ICD-10-CM | POA: Diagnosis not present

## 2014-12-02 DIAGNOSIS — R51 Headache: Secondary | ICD-10-CM | POA: Diagnosis present

## 2014-12-02 DIAGNOSIS — I10 Essential (primary) hypertension: Secondary | ICD-10-CM | POA: Insufficient documentation

## 2014-12-02 DIAGNOSIS — Z791 Long term (current) use of non-steroidal anti-inflammatories (NSAID): Secondary | ICD-10-CM | POA: Insufficient documentation

## 2014-12-02 DIAGNOSIS — I2510 Atherosclerotic heart disease of native coronary artery without angina pectoris: Secondary | ICD-10-CM | POA: Insufficient documentation

## 2014-12-02 DIAGNOSIS — G4733 Obstructive sleep apnea (adult) (pediatric): Secondary | ICD-10-CM | POA: Diagnosis not present

## 2014-12-02 DIAGNOSIS — Z91040 Latex allergy status: Secondary | ICD-10-CM | POA: Diagnosis not present

## 2014-12-02 MED ORDER — AMOXICILLIN-POT CLAVULANATE 875-125 MG PO TABS: 1.0000 | ORAL_TABLET | Freq: Two times a day (BID) | ORAL | Status: DC

## 2014-12-02 NOTE — Telephone Encounter (Signed)
Returning your call. °

## 2014-12-02 NOTE — Telephone Encounter (Signed)
Patient notified preliminary lab results are all normal. She is aware she will get call back if MD says changes need to be made to her meds.   Routed to [employer] as Juluis Rainier

## 2014-12-02 NOTE — Telephone Encounter (Signed)
LMTCB

## 2014-12-02 NOTE — ED Notes (Signed)
Pt reports facial pain with radiation to both ears along with slight facial redness for 9 days. Pt thinks she may have a sinus infection.

## 2014-12-02 NOTE — Discharge Instructions (Signed)
Your symptoms are likely due to sinusitis. You'll be treated for this with antibiotics. Please follow-up with your doctor in 3 days for reevaluation. Take all of your antibiotics as prescribed. Do not save or share them. Return to ED for any new or worsening symptoms.  Sinusitis, Adult Sinusitis is redness, soreness, and inflammation of the paranasal sinuses. Paranasal sinuses are air pockets within the bones of your face. They are located beneath your eyes, in the middle of your forehead, and above your eyes. In healthy paranasal sinuses, mucus is able to drain out, and air is able to circulate through them by way of your nose. However, when your paranasal sinuses are inflamed, mucus and air can become trapped. This can allow bacteria and other germs to grow and cause infection. Sinusitis can develop quickly and last only a short time (acute) or continue over a long period (chronic). Sinusitis that lasts for more than 12 weeks is considered chronic. CAUSES Causes of sinusitis include:  Allergies.  Structural abnormalities, such as displacement of the cartilage that separates your nostrils (deviated septum), which can decrease the air flow through your nose and sinuses and affect sinus drainage.  Functional abnormalities, such as when the small hairs (cilia) that line your sinuses and help remove mucus do not work properly or are not present. SIGNS AND SYMPTOMS Symptoms of acute and chronic sinusitis are the same. The primary symptoms are pain and pressure around the affected sinuses. Other symptoms include:  Upper toothache.  Earache.  Headache.  Bad breath.  Decreased sense of smell and taste.  A cough, which worsens when you are lying flat.  Fatigue.  Fever.  Thick drainage from your nose, which often is green and may contain pus (purulent).  Swelling and warmth over the affected sinuses. DIAGNOSIS Your health care provider will perform a physical exam. During your exam, your  health care provider may perform any of the following to help determine if you have acute sinusitis or chronic sinusitis:  Look in your nose for signs of abnormal growths in your nostrils (nasal polyps).  Tap over the affected sinus to check for signs of infection.  View the inside of your sinuses using an imaging device that has a light attached (endoscope). If your health care provider suspects that you have chronic sinusitis, one or more of the following tests may be recommended:  Allergy tests.  Nasal culture. A sample of mucus is taken from your nose, sent to a lab, and screened for bacteria.  Nasal cytology. A sample of mucus is taken from your nose and examined by your health care provider to determine if your sinusitis is related to an allergy. TREATMENT Most cases of acute sinusitis are related to a viral infection and will resolve on their own within 10 days. Sometimes, medicines are prescribed to help relieve symptoms of both acute and chronic sinusitis. These may include pain medicines, decongestants, nasal steroid sprays, or saline sprays. However, for sinusitis related to a bacterial infection, your health care provider will prescribe antibiotic medicines. These are medicines that will help kill the bacteria causing the infection. Rarely, sinusitis is caused by a fungal infection. In these cases, your health care provider will prescribe antifungal medicine. For some cases of chronic sinusitis, surgery is needed. Generally, these are cases in which sinusitis recurs more than 3 times per year, despite other treatments. HOME CARE INSTRUCTIONS  Drink plenty of water. Water helps thin the mucus so your sinuses can drain more easily.  Use  a humidifier.  Inhale steam 3-4 times a day (for example, sit in the bathroom with the shower running).  Apply a warm, moist washcloth to your face 3-4 times a day, or as directed by your health care provider.  Use saline nasal sprays to help  moisten and clean your sinuses.  Take medicines only as directed by your health care provider.  If you were prescribed either an antibiotic or antifungal medicine, finish it all even if you start to feel better. SEEK IMMEDIATE MEDICAL CARE IF:  You have increasing pain or severe headaches.  You have nausea, vomiting, or drowsiness.  You have swelling around your face.  You have vision problems.  You have a stiff neck.  You have difficulty breathing.   This information is not intended to replace advice given to you by your health care provider. Make sure you discuss any questions you have with your health care provider.   Document Released: 01/07/2005 Document Revised: 01/28/2014 Document Reviewed: 01/22/2011 Elsevier Interactive Patient Education [employer].

## 2014-12-02 NOTE — Telephone Encounter (Signed)
Spoke with patient and told her recent labs were all WNL

## 2014-12-02 NOTE — ED Provider Notes (Signed)
CSN: [id number]     Arrival date & time 12/02/14  0848 History   First MD Initiated Contact with Patient 12/02/14 [phone]     Chief Complaint  Patient presents with  . Facial Pain     (Consider location/radiation/quality/duration/timing/severity/associated sxs/prior Treatment) HPI [name] is a 54 y.o. female history of heart failure, comes in for evaluation of facial pain. Patient reports for the past 10 days she has had facial pain with warmth and swelling. Discomfort radiates into bilateral ears. She is tried Tylenol, ibuprofen, Mucinex DM without relief of her symptoms. She thinks she may have a sinus infection. She denies any fevers, chills, purulent nasal drainage, sore throat, eye pain, headache, numbness or weakness, chest pain or shortness of breath. Nothing makes her problems worse. No other aggravating or modifying factors. Patient denies any allergies  Past Medical History  Diagnosis Date  . Common migraine   . Morbid obesity ([place])   . Hyperlipidemia   . OSA (obstructive sleep apnea)     CPAP dependent  . Carpal tunnel syndrome   . Hypertension   . Allergic rhinitis   . CAD (coronary artery disease)     LHC (11/29/1999):  EF 60%; no significant CAD.  [name] Chronic diastolic CHF (congestive heart failure) ([place])     a. Echo (01/21/11):  Vigorous LVF, EF 65-70%, no RWMA, Gr 2 DD. ;  b.  Echo (12/15): Moderate LVH, EF 123456, grade 2 diastolic dysfunction, mild LAE, normal RV function  . Diverticulitis   . Acute on chronic diastolic CHF (congestive heart failure) ([place]) 04/11/2014  . Sinusitis, chronic 03/10/2012    CT sinuses 02/2012:  Chronic sinusitis with small A/F levels >> rx by ENT with prolonged augmentin F/u ct sinuses 04/2012:  Persistent sinus thickening >> rx with levaquin and clinda for 30 days.  Told to return for sinus scan in 6 weeks >> no showed for followup visit.  CT sinuses 11/2013:  Acute and chronic sinusitis noted.    [name] PNEUMONIA, ORGAN UNSPECIFIED 03/01/2009   Qualifier: Diagnosis of  By: [name] CMA, [name]    . Acute suppurative otitis media without spontaneous rupture of eardrum 02/04/2007    Centricity Description: OTITIS MEDIA, SUPPURATIVE, ACUTE, BILATERAL Qualifier: Diagnosis of  By: [name] MD, [name]  Centricity Description: OTITIS MEDIA, SUPPURATIVE, ACUTE Qualifier: Diagnosis of  By: [name] MD, [name]   . Carpal tunnel syndrome 06/09/2006    Qualifier: Diagnosis of  By: Marca Ancona RMA, Lucy    . IBS (irritable bowel syndrome)   . Diastolic heart failure ([place])   . Hepatic steatosis   . Hiatal hernia   . Hepatomegaly   . Internal hemorrhoids    Past Surgical History  Procedure Laterality Date  . Tubal ligation    . Tonsillectomy    . Uvulopalatopharyngoplasty    . Cesarean section    . Nasal sinus surgery    . Partial hysterectomy     Family History  Problem Relation Age of Onset  . Coronary artery disease      1st degree relatvie <50  . Breast cancer      aunt- ? paternal or maternal  . Asthma Sister   . Heart disease Mother   . Heart attack Mother   . Hypertension Mother   . Stroke Neg Hx    Social History  Substance Use Topics  . Smoking status: Never Smoker   . Smokeless tobacco: Never Used  . Alcohol Use: No   OB History  No data available     Review of Systems A 10 point review of systems was completed and was negative except for pertinent positives and negatives as mentioned in the history of present illness     Allergies  Doxycycline; Latex; Ciprofloxacin; Fish allergy; Hydrocortisone; Neomycin; and Sulfonamide derivatives  Home Medications   Prior to Admission medications   Medication Sig Start Date End Date Taking? Authorizing Provider  albuterol (PROVENTIL HFA;VENTOLIN HFA) 108 (90 BASE) MCG/ACT inhaler Inhale 1-2 puffs into the lungs every 6 (six) hours as needed for wheezing or shortness of breath. 03/20/14   Ripley Fraise, MD  amoxicillin-clavulanate (AUGMENTIN) 875-125 MG tablet Take 1 tablet  by mouth every 12 (twelve) hours. 12/02/14   Comer Locket, PA-C  calcium carbonate (TUMS - DOSED IN MG ELEMENTAL CALCIUM) 500 MG chewable tablet Chew 1 tablet by mouth daily.    Historical Provider, MD  dextromethorphan-guaiFENesin (MUCINEX DM) 30-600 MG per 12 hr tablet Take 1 tablet by mouth 2 (two) times daily.    Historical Provider, MD  Docusate Calcium (STOOL SOFTENER PO) Take 1 tablet by mouth as needed.    Historical Provider, MD  famotidine (PEPCID) 20 MG tablet Take 20 mg by mouth at bedtime.    Historical Provider, MD  furosemide (LASIX) 40 MG tablet Take 2 tablets (80 mg total) by mouth 2 (two) times daily. For swelling or fluid retention. 09/01/14   Minus Breeding, MD  losartan (COZAAR) 100 MG tablet Take 1 tablet (100 mg total) by mouth daily. 10/13/14   Minus Breeding, MD  metolazone (ZAROXOLYN) 2.5 MG tablet Take 1 tablet (2.5 mg total) by mouth daily as needed. see notes 11/29/14   Minus Breeding, MD  naproxen sodium (ANAPROX) 220 MG tablet Take 220 mg by mouth 2 (two) times daily with a meal.    Historical Provider, MD  potassium chloride SA (KLOR-CON M20) 20 MEQ tablet Take 5 tablets (100 mEq total) by mouth daily. 06/13/14   Liliane Shi, PA-C  spironolactone (ALDACTONE) 25 MG tablet Take 0.5 tablets (12.5 mg total) by mouth daily. 05/18/14   Minus Breeding, MD   BP 164/89 mmHg  Pulse 61  Temp(Src) 98.2 F (36.8 C) (Oral)  Resp 16  SpO2 97% Physical Exam  Constitutional:  Awake, alert, nontoxic appearance.  HENT:  Head: Atraumatic.  Right Ear: External ear normal.  Left Ear: External ear normal.  Mouth/Throat: Oropharynx is clear and moist. No oropharyngeal exudate.  Diffuse tenderness throughout maxillary and frontal sinuses. TMs clear bilaterally  Eyes: Right eye exhibits no discharge. Left eye exhibits no discharge.  Neck: Normal range of motion. Neck supple.  Pulmonary/Chest: Effort normal. She exhibits no tenderness.  Abdominal: Soft. There is no tenderness.  There is no rebound.  Musculoskeletal: She exhibits no tenderness.  Baseline ROM, no obvious new focal weakness.  Lymphadenopathy:    She has no cervical adenopathy.  Neurological:  Mental status and motor strength appears baseline for patient and situation.  Skin: No rash noted.  Psychiatric: She has a normal mood and affect.  Nursing note and vitals reviewed.   ED Course  Procedures (including critical care time) Labs Review Labs Reviewed - No data to display  Imaging Review No results found. I have personally reviewed and evaluated these images and lab results as part of my medical decision-making.   EKG Interpretation None     Meds given in ED:  Medications - No data to display  New Prescriptions   AMOXICILLIN-CLAVULANATE (AUGMENTIN) 875-125 MG TABLET  Take 1 tablet by mouth every 12 (twelve) hours.   Filed Vitals:   12/02/14 0904  BP: 164/89  Pulse: 61  Temp: 98.2 F (36.8 C)  TempSrc: Oral  Resp: 16  SpO2: 97%    MDM  [name] is a 54 y.o. female presents for evaluation of facial pain. Symptoms ongoing for 10 days and consistent with sinusitis. We'll treat empirically with Augmentin. Discussed follow-up with PCP in 3 days for reevaluation. Strict return precautions given. No evidence of other acute or emergent pathology. Overall, patient is well, nontoxic, hemodynamically stable and appropriate for discharge. Final diagnoses:  Sinusitis, unspecified chronicity, unspecified location        Comer Locket, PA-C 12/02/14 0930  Debby Freiberg, MD 12/10/14 [phone]

## 2014-12-02 NOTE — Telephone Encounter (Signed)
Calling to get her lab results from Tuesday

## 2014-12-02 NOTE — Telephone Encounter (Signed)
[name] is returning a call .[name] Please call   Thanks

## 2014-12-30 ENCOUNTER — Ambulatory Visit: Payer: No Typology Code available for payment source | Admitting: Physician Assistant

## 2015-01-02 ENCOUNTER — Telehealth: Payer: Self-pay | Admitting: Pulmonary Disease

## 2015-01-02 NOTE — Telephone Encounter (Signed)
Spoke with patient-she is actually able to see TP on Wednesday 01-04-15 at 9:30am. Nothing more needed at this time.

## 2015-01-02 NOTE — Telephone Encounter (Signed)
[name]- see if you can get her in with me or TP

## 2015-01-02 NOTE — Telephone Encounter (Signed)
Pt needs CPAP tubing/supplies Pt was advised by DME that she need to see Dr Annamaria Boots because they need a face-to-face and a Rx in order to give this to her.  Will send to Dr Annamaria Boots for an appt earlier than January 2017 Pt refused appt offered 01/05/15  Please advise CY. Thanks.

## 2015-01-04 ENCOUNTER — Ambulatory Visit (INDEPENDENT_AMBULATORY_CARE_PROVIDER_SITE_OTHER): Payer: No Typology Code available for payment source | Admitting: Adult Health

## 2015-01-04 ENCOUNTER — Telehealth: Payer: Self-pay | Admitting: Adult Health

## 2015-01-04 ENCOUNTER — Encounter: Payer: Self-pay | Admitting: Adult Health

## 2015-01-04 VITALS — BP 132/82 | HR 63 | Temp 97.7°F | Ht 63.0 in | Wt 306.0 lb

## 2015-01-04 DIAGNOSIS — G4733 Obstructive sleep apnea (adult) (pediatric): Secondary | ICD-10-CM | POA: Diagnosis not present

## 2015-01-04 NOTE — Assessment & Plan Note (Signed)
Wt loss encouarged  

## 2015-01-04 NOTE — Progress Notes (Signed)
Subjective:    Patient ID: [name], female    DOB: October 25, 1960, 54 y.o.   MRN: [id number]  HPI  54 yo female with OSA  Former patient of Dr. Gwenette Greet followed for chronic sinusitis and cough .   TEST  2004   01/04/2015 Follow up : OSA  Pt returns for yearly follow up for sleep apnea.  Says she has been on CPAP or BIPAP for many years.  It appears she was started on CPAP in 2004 . I have looked back through the records but unable to find  When she was changed to BIPAP .  Says she wears BIPAP 6-7 hr each night. Does not miss any nights.  Mask fits fine. DME called and pt is on IPAP/EPAP 15/11 . She does feel her pressures have not been as good lately and she feels more tired during daytime.  Machine does not have a chip . We have requested a download from DME.    Past Medical History  Diagnosis Date  . Common migraine   . Morbid obesity ([place])   . Hyperlipidemia   . OSA (obstructive sleep apnea)     CPAP dependent  . Carpal tunnel syndrome   . Hypertension   . Allergic rhinitis   . CAD (coronary artery disease)     LHC (11/29/1999):  EF 60%; no significant CAD.  [name] Chronic diastolic CHF (congestive heart failure) ([place])     a. Echo (01/21/11):  Vigorous LVF, EF 65-70%, no RWMA, Gr 2 DD. ;  b.  Echo (12/15): Moderate LVH, EF 123456, grade 2 diastolic dysfunction, mild LAE, normal RV function  . Diverticulitis   . Acute on chronic diastolic CHF (congestive heart failure) ([place]) 04/11/2014  . Sinusitis, chronic 03/10/2012    CT sinuses 02/2012:  Chronic sinusitis with small A/F levels >> rx by ENT with prolonged augmentin F/u ct sinuses 04/2012:  Persistent sinus thickening >> rx with levaquin and clinda for 30 days.  Told to return for sinus scan in 6 weeks >> no showed for followup visit.  CT sinuses 11/2013:  Acute and chronic sinusitis noted.    [name] PNEUMONIA, ORGAN UNSPECIFIED 03/01/2009    Qualifier: Diagnosis of  By: [name] CMA, [name]    . Acute suppurative otitis media  without spontaneous rupture of eardrum 02/04/2007    Centricity Description: OTITIS MEDIA, SUPPURATIVE, ACUTE, BILATERAL Qualifier: Diagnosis of  By: [name] MD, [name]  Centricity Description: OTITIS MEDIA, SUPPURATIVE, ACUTE Qualifier: Diagnosis of  By: [name] MD, [name]   . Carpal tunnel syndrome 06/09/2006    Qualifier: Diagnosis of  By: Marca Ancona RMA, Lucy    . IBS (irritable bowel syndrome)   . Diastolic heart failure ([place])   . Hepatic steatosis   . Hiatal hernia   . Hepatomegaly   . Internal hemorrhoids    Current Outpatient Prescriptions on File Prior to Visit  Medication Sig Dispense Refill  . albuterol (PROVENTIL HFA;VENTOLIN HFA) 108 (90 BASE) MCG/ACT inhaler Inhale 1-2 puffs into the lungs every 6 (six) hours as needed for wheezing or shortness of breath. 1 Inhaler 0  . calcium carbonate (TUMS - DOSED IN MG ELEMENTAL CALCIUM) 500 MG chewable tablet Chew 1 tablet by mouth daily.    [name] dextromethorphan-guaiFENesin (MUCINEX DM) 30-600 MG per 12 hr tablet Take 1 tablet by mouth 2 (two) times daily.    Mariane Baumgarten Calcium (STOOL SOFTENER PO) Take 1 tablet by mouth as needed.    . famotidine (PEPCID) 20  MG tablet Take 20 mg by mouth at bedtime.    . furosemide (LASIX) 40 MG tablet Take 2 tablets (80 mg total) by mouth 2 (two) times daily. For swelling or fluid retention. 360 tablet 3  . losartan (COZAAR) 100 MG tablet Take 1 tablet (100 mg total) by mouth daily. 90 tablet 1  . metolazone (ZAROXOLYN) 2.5 MG tablet Take 1 tablet (2.5 mg total) by mouth daily as needed. see notes 90 tablet 1  . naproxen sodium (ANAPROX) 220 MG tablet Take 220 mg by mouth 2 (two) times daily with a meal.    . potassium chloride SA (KLOR-CON M20) 20 MEQ tablet Take 5 tablets (100 mEq total) by mouth daily.    [name] spironolactone (ALDACTONE) 25 MG tablet Take 0.5 tablets (12.5 mg total) by mouth daily. 45 tablet 3   No current facility-administered medications on file prior to visit.     Review of  Systems Constitutional:   No  weight loss, night sweats,  Fevers, chills,  +fatigue, or  lassitude.  HEENT:   No headaches,  Difficulty swallowing,  Tooth/dental problems, or  Sore throat,                No sneezing, itching, ear ache, nasal congestion, post nasal drip,   CV:  No chest pain,  Orthopnea, PND, swelling in lower extremities, anasarca, dizziness, palpitations, syncope.   GI  No heartburn, indigestion, abdominal pain, nausea, vomiting, diarrhea, change in bowel habits, loss of appetite, bloody stools.   Resp: No shortness of breath with exertion or at rest.  No excess mucus, no productive cough,  No non-productive cough,  No coughing up of blood.  No change in color of mucus.  No wheezing.  No chest wall deformity  Skin: no rash or lesions.  GU: no dysuria, change in color of urine, no urgency or frequency.  No flank pain, no hematuria   MS:  No joint pain or swelling.  No decreased range of motion.  No back pain.  Psych:  No change in mood or affect. No depression or anxiety.  No memory loss.         Objective:   Physical Exam  Filed Vitals:   01/04/15 0938  BP: 132/82  Pulse: 63  Temp: 97.7 F (36.5 C)  TempSrc: Oral  Height: 5\' 3"  (1.6 m)  Weight: 306 lb (138.801 kg)  SpO2: 98%    GEN: A/Ox3; pleasant , NAD, obese   HEENT:  [place]/AT,  EACs-clear, TMs-wnl, NOSE-clear, THROAT-clear, no lesions, no postnasal drip or exudate noted. Class 2-3 MP airway   NECK:  Supple w/ fair ROM; no JVD; normal carotid impulses w/o bruits; no thyromegaly or nodules palpated; no lymphadenopathy.  RESP  Clear  P & A; w/o, wheezes/ rales/ or rhonchi.no accessory muscle use, no dullness to percussion  CARD:  RRR, no m/r/g  , tr  peripheral edema, pulses intact, no cyanosis or clubbing.  GI:   Soft & nt; nml bowel sounds; no organomegaly or masses detected.  Musco: Warm bil, no deformities or joint swelling noted.   Neuro: alert, no focal deficits noted.    Skin: Warm, no  lesions or rashes         Assessment & Plan:

## 2015-01-04 NOTE — Telephone Encounter (Addendum)
Spoke with pt. States that [facility] needs her settings for BiPAP. Advised her that we are doing auto setting for 2 weeks. Once this is done then we will determine what her BiPAP needs to be set at. Nothing further was needed.

## 2015-01-04 NOTE — Patient Instructions (Signed)
Continue on BiPAP at night Goals to wear for at least 6 hours each night. Work on weight loss. Do not drive a sleepy Order has been sent to your home care company for a auto BiPAP titration for 2 weeks  Download requested.  Follow up Dr. [name]  In 1 year and As needed

## 2015-01-04 NOTE — Assessment & Plan Note (Signed)
DME contacted , will need download .  Also place on BIPAP autotitration for 2 weeks with download.   Plan  Continue on BiPAP at night Goals to wear for at least 6 hours each night. Work on weight loss. Do not drive a sleepy Order has been sent to your home care company for a auto BiPAP titration for 2 weeks  Download requested.  Follow up Dr. [name]  In 1 year and As needed

## 2015-01-11 ENCOUNTER — Telehealth: Payer: Self-pay | Admitting: Adult Health

## 2015-01-11 MED ORDER — AMOXICILLIN-POT CLAVULANATE 875-125 MG PO TABS: 1.0000 | ORAL_TABLET | Freq: Two times a day (BID) | ORAL | Status: DC

## 2015-01-11 NOTE — Telephone Encounter (Signed)
TP out of office this afternoon. Will send to MW per TP's request.  MW please advise.

## 2015-01-11 NOTE — Telephone Encounter (Signed)
Augmentin 875 mg take one pill twice daily  X 10 days - take at breakfast and supper with large glass of water.  It would help reduce the usual side effects (diarrhea and yeast infections) if you ate cultured yogurt at lunch.  If not 10% better p 10 days needs repeat sinus CT as the record suggests she never cleared it completely before and that may be the source for her recurrent coughing

## 2015-01-11 NOTE — Telephone Encounter (Signed)
Spoke with pt. States that she thinks she has bronchitis. Reports having "chunks of mucus" coming up in her throat when she lays down. Denies cough, chest tightness, SOB, wheezing or fever. Onset was 5 days. Would like to know what TP recommends for this.  TP - please advise. Thanks.

## 2015-01-11 NOTE — Telephone Encounter (Signed)
Spoke with pt. She is aware of MW's recommendation. Rx has been sent in. Nothing further was needed. 

## 2015-01-13 ENCOUNTER — Ambulatory Visit: Payer: No Typology Code available for payment source | Admitting: Physician Assistant

## 2015-01-19 ENCOUNTER — Ambulatory Visit: Payer: No Typology Code available for payment source | Admitting: Internal Medicine

## 2015-02-09 ENCOUNTER — Ambulatory Visit: Payer: No Typology Code available for payment source | Admitting: Internal Medicine

## 2015-02-19 ENCOUNTER — Observation Stay (HOSPITAL_COMMUNITY)
Admission: EM | Admit: 2015-02-19 | Discharge: 2015-02-23 | Disposition: A | Discharge status: Home / Self Care | Payer: No Typology Code available for payment source | Attending: Internal Medicine | Admitting: Internal Medicine

## 2015-02-19 ENCOUNTER — Emergency Department (HOSPITAL_COMMUNITY): Payer: No Typology Code available for payment source

## 2015-02-19 ENCOUNTER — Encounter (HOSPITAL_COMMUNITY): Payer: Self-pay

## 2015-02-19 DIAGNOSIS — R7303 Prediabetes: Secondary | ICD-10-CM | POA: Insufficient documentation

## 2015-02-19 DIAGNOSIS — Z881 Allergy status to other antibiotic agents status: Secondary | ICD-10-CM | POA: Diagnosis not present

## 2015-02-19 DIAGNOSIS — R079 Chest pain, unspecified: Secondary | ICD-10-CM | POA: Diagnosis present

## 2015-02-19 DIAGNOSIS — I110 Hypertensive heart disease with heart failure: Principal | ICD-10-CM | POA: Insufficient documentation

## 2015-02-19 DIAGNOSIS — Z7982 Long term (current) use of aspirin: Secondary | ICD-10-CM | POA: Diagnosis not present

## 2015-02-19 DIAGNOSIS — R51 Headache: Secondary | ICD-10-CM | POA: Insufficient documentation

## 2015-02-19 DIAGNOSIS — R9431 Abnormal electrocardiogram [ECG] [EKG]: Secondary | ICD-10-CM | POA: Diagnosis not present

## 2015-02-19 DIAGNOSIS — Z6841 Body Mass Index (BMI) 40.0 and over, adult: Secondary | ICD-10-CM | POA: Insufficient documentation

## 2015-02-19 DIAGNOSIS — E782 Mixed hyperlipidemia: Secondary | ICD-10-CM | POA: Diagnosis not present

## 2015-02-19 DIAGNOSIS — Z91040 Latex allergy status: Secondary | ICD-10-CM | POA: Insufficient documentation

## 2015-02-19 DIAGNOSIS — Z882 Allergy status to sulfonamides status: Secondary | ICD-10-CM | POA: Insufficient documentation

## 2015-02-19 DIAGNOSIS — E1169 Type 2 diabetes mellitus with other specified complication: Secondary | ICD-10-CM

## 2015-02-19 DIAGNOSIS — I509 Heart failure, unspecified: Secondary | ICD-10-CM

## 2015-02-19 DIAGNOSIS — I5033 Acute on chronic diastolic (congestive) heart failure: Secondary | ICD-10-CM

## 2015-02-19 DIAGNOSIS — I5042 Chronic combined systolic (congestive) and diastolic (congestive) heart failure: Secondary | ICD-10-CM

## 2015-02-19 DIAGNOSIS — R0789 Other chest pain: Secondary | ICD-10-CM

## 2015-02-19 DIAGNOSIS — Z23 Encounter for immunization: Secondary | ICD-10-CM | POA: Diagnosis not present

## 2015-02-19 DIAGNOSIS — Z888 Allergy status to other drugs, medicaments and biological substances status: Secondary | ICD-10-CM | POA: Insufficient documentation

## 2015-02-19 DIAGNOSIS — K760 Fatty (change of) liver, not elsewhere classified: Secondary | ICD-10-CM | POA: Diagnosis not present

## 2015-02-19 DIAGNOSIS — I10 Essential (primary) hypertension: Secondary | ICD-10-CM | POA: Diagnosis present

## 2015-02-19 DIAGNOSIS — I5032 Chronic diastolic (congestive) heart failure: Secondary | ICD-10-CM

## 2015-02-19 DIAGNOSIS — G4733 Obstructive sleep apnea (adult) (pediatric): Secondary | ICD-10-CM | POA: Insufficient documentation

## 2015-02-19 DIAGNOSIS — E876 Hypokalemia: Secondary | ICD-10-CM | POA: Diagnosis not present

## 2015-02-19 DIAGNOSIS — E785 Hyperlipidemia, unspecified: Secondary | ICD-10-CM | POA: Diagnosis present

## 2015-02-19 LAB — TROPONIN I

## 2015-02-19 LAB — CBC
HCT: 37.9 % (ref 36.0–46.0)
HEMOGLOBIN: 12.1 g/dL (ref 12.0–15.0)
MCH: 29.9 pg (ref 26.0–34.0)
MCHC: 31.9 g/dL (ref 30.0–36.0)
MCV: 93.6 fL (ref 78.0–100.0)
PLATELETS: 290 10*3/uL (ref 150–400)
RBC: 4.05 MIL/uL (ref 3.87–5.11)
RDW: 14.8 % (ref 11.5–15.5)
WBC: 8.8 10*3/uL (ref 4.0–10.5)

## 2015-02-19 LAB — HEPATIC FUNCTION PANEL
ALBUMIN: 3.2 g/dL — AB (ref 3.5–5.0)
ALK PHOS: 77 U/L (ref 38–126)
ALT: 20 U/L (ref 14–54)
AST: 24 U/L (ref 15–41)
Bilirubin, Direct: 0.2 mg/dL (ref 0.1–0.5)
Indirect Bilirubin: 0.7 mg/dL (ref 0.3–0.9)
TOTAL PROTEIN: 7.6 g/dL (ref 6.5–8.1)
Total Bilirubin: 0.9 mg/dL (ref 0.3–1.2)

## 2015-02-19 LAB — BASIC METABOLIC PANEL
Anion gap: 9 (ref 5–15)
BUN: 14 mg/dL (ref 6–20)
CHLORIDE: 103 mmol/L (ref 101–111)
CO2: 27 mmol/L (ref 22–32)
CREATININE: 0.56 mg/dL (ref 0.44–1.00)
Calcium: 8.6 mg/dL — ABNORMAL LOW (ref 8.9–10.3)
GFR calc non Af Amer: >60 mL/min (ref >60)
Glucose, Bld: 112 mg/dL — ABNORMAL HIGH (ref 65–99)
Potassium: 3.3 mmol/L — ABNORMAL LOW (ref 3.5–5.1)
Sodium: 139 mmol/L (ref 135–145)

## 2015-02-19 LAB — I-STAT TROPONIN, ED: Troponin i, poc: 0.01 ng/mL (ref 0.00–0.08)

## 2015-02-19 LAB — BRAIN NATRIURETIC PEPTIDE: B Natriuretic Peptide: 52 pg/mL (ref 0.0–100.0)

## 2015-02-19 MED ORDER — NAPROXEN 250 MG PO TABS
250.0000 mg | ORAL_TABLET | Freq: Two times a day (BID) | ORAL | Status: DC
  Administered 2015-02-19 – 2015-02-20 (×2): 250 mg via ORAL
  Filled 2015-02-19 (×4): qty 1

## 2015-02-19 MED ORDER — LOSARTAN POTASSIUM 50 MG PO TABS
100.0000 mg | ORAL_TABLET | Freq: Once | ORAL | Status: AC
  Administered 2015-02-19: 100 mg via ORAL
  Filled 2015-02-19: qty 2

## 2015-02-19 MED ORDER — METOLAZONE 2.5 MG PO TABS: 2.5000 mg | ORAL_TABLET | Freq: Every day | ORAL | Status: DC | PRN

## 2015-02-19 MED ORDER — ACETAMINOPHEN 500 MG PO TABS
1000.0000 mg | ORAL_TABLET | Freq: Once | ORAL | Status: AC
  Administered 2015-02-19: 1000 mg via ORAL
  Filled 2015-02-19: qty 2

## 2015-02-19 MED ORDER — ALBUTEROL SULFATE (2.5 MG/3ML) 0.083% IN NEBU: 2.5000 mg | INHALATION_SOLUTION | Freq: Four times a day (QID) | RESPIRATORY_TRACT | Status: DC | PRN

## 2015-02-19 MED ORDER — SODIUM CHLORIDE 0.9% FLUSH
3.0000 mL | Freq: Two times a day (BID) | INTRAVENOUS | Status: DC
  Administered 2015-02-19 – 2015-02-23 (×9): 3 mL via INTRAVENOUS

## 2015-02-19 MED ORDER — POTASSIUM CHLORIDE CRYS ER 20 MEQ PO TBCR
40.0000 meq | EXTENDED_RELEASE_TABLET | Freq: Three times a day (TID) | ORAL | Status: DC
  Administered 2015-02-19 – 2015-02-23 (×13): 40 meq via ORAL
  Filled 2015-02-19 (×13): qty 2

## 2015-02-19 MED ORDER — ISOSORB DINITRATE-HYDRALAZINE 20-37.5 MG PO TABS
1.0000 | ORAL_TABLET | Freq: Two times a day (BID) | ORAL | Status: DC
  Administered 2015-02-19 – 2015-02-20 (×3): 1 via ORAL
  Filled 2015-02-19 (×3): qty 1

## 2015-02-19 MED ORDER — ACETAMINOPHEN 325 MG PO TABS
650.0000 mg | ORAL_TABLET | ORAL | Status: DC | PRN
  Administered 2015-02-19: 650 mg via ORAL
  Filled 2015-02-19: qty 2

## 2015-02-19 MED ORDER — ASPIRIN 81 MG PO CHEW
324.0000 mg | CHEWABLE_TABLET | Freq: Once | ORAL | Status: AC
  Administered 2015-02-19: 324 mg via ORAL
  Filled 2015-02-19: qty 4

## 2015-02-19 MED ORDER — FUROSEMIDE 10 MG/ML IJ SOLN
80.0000 mg | Freq: Two times a day (BID) | INTRAMUSCULAR | Status: DC
  Administered 2015-02-19 – 2015-02-22 (×6): 80 mg via INTRAVENOUS
  Filled 2015-02-19 (×6): qty 8

## 2015-02-19 MED ORDER — ASPIRIN EC 81 MG PO TBEC
81.0000 mg | DELAYED_RELEASE_TABLET | Freq: Every day | ORAL | Status: DC
  Administered 2015-02-20 – 2015-02-23 (×4): 81 mg via ORAL
  Filled 2015-02-19 (×4): qty 1

## 2015-02-19 MED ORDER — ONDANSETRON HCL 4 MG/2ML IJ SOLN: 4.0000 mg | Freq: Four times a day (QID) | INTRAMUSCULAR | Status: DC | PRN

## 2015-02-19 MED ORDER — DM-GUAIFENESIN ER 30-600 MG PO TB12
1.0000 | ORAL_TABLET | Freq: Two times a day (BID) | ORAL | Status: DC
  Administered 2015-02-19 – 2015-02-23 (×9): 1 via ORAL
  Filled 2015-02-19 (×9): qty 1

## 2015-02-19 MED ORDER — BISACODYL 5 MG PO TBEC: 15.0000 mg | DELAYED_RELEASE_TABLET | Freq: Every day | ORAL | Status: DC | PRN

## 2015-02-19 MED ORDER — LOSARTAN POTASSIUM 50 MG PO TABS
100.0000 mg | ORAL_TABLET | Freq: Every day | ORAL | Status: DC
  Administered 2015-02-20 – 2015-02-23 (×4): 100 mg via ORAL
  Filled 2015-02-19 (×6): qty 2

## 2015-02-19 MED ORDER — NITROGLYCERIN 2 % TD OINT
1.0000 [in_us] | TOPICAL_OINTMENT | Freq: Once | TRANSDERMAL | Status: AC
  Administered 2015-02-19: 1 [in_us] via TOPICAL
  Filled 2015-02-19: qty 1

## 2015-02-19 MED ORDER — SPIRONOLACTONE 25 MG PO TABS
12.5000 mg | ORAL_TABLET | Freq: Every day | ORAL | Status: DC
Start: 2015-02-20 — End: 2015-02-20
  Administered 2015-02-20: 12.5 mg via ORAL
  Filled 2015-02-19: qty 1

## 2015-02-19 MED ORDER — FUROSEMIDE 80 MG PO TABS: 80.0000 mg | ORAL_TABLET | Freq: Two times a day (BID) | ORAL | Status: DC

## 2015-02-19 MED ORDER — SODIUM CHLORIDE 0.9 % IV SOLN: 250.0000 mL | INTRAVENOUS | Status: DC | PRN

## 2015-02-19 MED ORDER — SODIUM CHLORIDE 0.9% FLUSH: 3.0000 mL | INTRAVENOUS | Status: DC | PRN

## 2015-02-19 MED ORDER — MORPHINE SULFATE (PF) 4 MG/ML IV SOLN
4.0000 mg | INTRAVENOUS | Status: DC | PRN
  Administered 2015-02-19 – 2015-02-20 (×3): 4 mg via INTRAVENOUS
  Filled 2015-02-19 (×3): qty 1

## 2015-02-19 NOTE — Consult Note (Addendum)
CARDIOLOGY CONSULT NOTE  Referring Physician: [place] Primary Cardiologist: [place] Reason for Consultation: CP and SOB   HPI:  [name] is a 55 y.o. female with a hx of morbid obesity, diastolic CHF, HTN, HL, sleep apnea. Cardiac catheterization in 2001 demonstrated no significant CAD. We are asked to see due to CP and SOB.   In December 2015 and she was tachycardic and had chest pain. DDimer was elevated but CTA of the chest was negative for pulmonary embolism. Echo demonstrated normal LV and RV function.   Admitted in 03/2014 with chest pain in the setting of a/c diastolic HF. Of note, BNP was normal. EF 60-65 grade 2 DD. RV normal. She was diuresed. She was Discharged on Lasix 80 mg twice a day, spironolactone 12.5 mg daily, K+ 20 mEq Twice daily.   She says her chest pressure began 5 days ago when she was walking from her car into work. Her chest became very heavy and she felt like there was something sitting on it. She felt very short of breath. Once she got into work, she had to rest for about 10 minutes before she could resume her activities. She reports that she has been getting increasing shortness of breath and has had cough productive of white phlegm. She reports she has increased her Lasix dose over the past 4 days but is not getting improvement in her shortness of breath. She  has gone from taking 160 mg of Lasix daily to 200 mg daily. Last night with multiple episodes of orthopnea. Finally awoke with chest pressure  at 3:00 a.m., and sat up for 3 hours. Took an extra fluid pill without relief. Feels like she cannot take a deep breath in. Troponin is normal. BNP 52. Weight up 7 pounds she says  ECG NSR 74. Nonspecific ST-T abnormalities.     Review of Systems:     Cardiac Review of Systems: {Y] = yes [ ]  = no  Chest Pain [  y  ]  Resting SOB [ y  ] Exertional SOB  Blue.Reese  ]  Orthopnea [  ]   Pedal Edema [   ]    Palpitations [  ] Syncope  [  ]   Presyncope [    ]  General Review of Systems: [Y] = yes [  ]=no Constitional: recent weight change Blue.Reese  ]; anorexia [  ]; fatigue [  y]; nausea [  ]; night sweats [  ]; fever [  ]; or chills [  ];                                                                     Eyes : blurred vision [  ]; diplopia [   ]; vision changes [  ];  Amaurosis fugax[  ]; Resp: cough [  ];  wheezing[  ];  hemoptysis[  ];  PND [  ];  GI:  gallstones[  ], vomiting[  ];  dysphagia[  ]; melena[  ];  hematochezia [  ]; heartburn[  ];   GU: kidney stones [  ]; hematuria[  ];   dysuria [  ];  nocturia[  ]; incontinence [  ];  Skin: rash, swelling[  ];, hair loss[  ];  peripheral edema[y  ];  or itching[  ]; Musculosketetal: myalgias[  ];  joint swelling[  ];  joint erythema[  ];  joint pain[y  ];  back pain[  ];  Heme/Lymph: bruising[  ];  bleeding[  ];  anemia[  ];  Neuro: TIA[  ];  headaches[  ];  stroke[  ];  vertigo[  ];  seizures[  ];   paresthesias[  ];  difficulty walking[  ];  Psych:depression[  ]; anxiety[  ];  Endocrine: diabetes[  ];  thyroid dysfunction[  ];  Other:  Past Medical History  Diagnosis Date  . Common migraine   . Morbid obesity ([place])   . Hyperlipidemia   . OSA (obstructive sleep apnea)     CPAP dependent  . Carpal tunnel syndrome   . Hypertension   . Allergic rhinitis   . CAD (coronary artery disease)     LHC (11/29/1999):  EF 60%; no significant CAD.  [name] Chronic diastolic CHF (congestive heart failure) ([place])     a. Echo (01/21/11):  Vigorous LVF, EF 65-70%, no RWMA, Gr 2 DD. ;  b.  Echo (12/15): Moderate LVH, EF 123456, grade 2 diastolic dysfunction, mild LAE, normal RV function  . Diverticulitis   . Acute on chronic diastolic CHF (congestive heart failure) ([place]) 04/11/2014  . Sinusitis, chronic 03/10/2012    CT sinuses 02/2012:  Chronic sinusitis with small A/F levels >> rx by ENT with prolonged augmentin F/u ct sinuses 04/2012:  Persistent sinus thickening >> rx with levaquin and clinda for 30  days.  Told to return for sinus scan in 6 weeks >> no showed for followup visit.  CT sinuses 11/2013:  Acute and chronic sinusitis noted.    [name] PNEUMONIA, ORGAN UNSPECIFIED 03/01/2009    Qualifier: Diagnosis of  By: [name] CMA, [name]    . Acute suppurative otitis media without spontaneous rupture of eardrum 02/04/2007    Centricity Description: OTITIS MEDIA, SUPPURATIVE, ACUTE, BILATERAL Qualifier: Diagnosis of  By: [name] MD, [name]  Centricity Description: OTITIS MEDIA, SUPPURATIVE, ACUTE Qualifier: Diagnosis of  By: [name] MD, [name]   . Carpal tunnel syndrome 06/09/2006    Qualifier: Diagnosis of  By: Marca Ancona RMA, Lucy    . IBS (irritable bowel syndrome)   . Diastolic heart failure ([place])   . Hepatic steatosis   . Hiatal hernia   . Hepatomegaly   . Internal hemorrhoids     Medications Prior to Admission  Medication Sig Dispense Refill  . albuterol (PROVENTIL HFA;VENTOLIN HFA) 108 (90 BASE) MCG/ACT inhaler Inhale 1-2 puffs into the lungs every 6 (six) hours as needed for wheezing or shortness of breath. 1 Inhaler 0  . bisacodyl (DULCOLAX) 5 MG EC tablet Take 15 mg by mouth daily as needed for moderate constipation.    [name] dextromethorphan-guaiFENesin (MUCINEX DM) 30-600 MG per 12 hr tablet Take 1 tablet by mouth 2 (two) times daily.    . furosemide (LASIX) 40 MG tablet Take 2 tablets (80 mg total) by mouth 2 (two) times daily. For swelling or fluid retention. 360 tablet 3  . losartan (COZAAR) 100 MG tablet Take 1 tablet (100 mg total) by mouth daily. 90 tablet 1  . metolazone (ZAROXOLYN) 2.5 MG tablet Take 1 tablet (2.5 mg total) by mouth daily as needed. see notes 90 tablet 1  . naproxen sodium (ANAPROX) 220 MG tablet Take 220 mg by mouth 2 (two) times daily with a meal.    .  potassium chloride SA (KLOR-CON M20) 20 MEQ tablet Take 5 tablets (100 mEq total) by mouth daily.    [name] amoxicillin-clavulanate (AUGMENTIN) 875-125 MG tablet Take 1 tablet by mouth 2 (two) times daily. (Patient not  taking: Reported on 02/19/2015) 20 tablet 0  . spironolactone (ALDACTONE) 25 MG tablet Take 0.5 tablets (12.5 mg total) by mouth daily. (Patient not taking: Reported on 02/19/2015) 45 tablet 3     . [START ON 02/20/2015] aspirin EC  81 mg Oral Daily  . dextromethorphan-guaiFENesin  1 tablet Oral BID  . furosemide  80 mg Oral BID  . isosorbide-hydrALAZINE  1 tablet Oral BID  . losartan  100 mg Oral Daily  . naproxen  250 mg Oral BID WC  . potassium chloride SA  40 mEq Oral TID  . sodium chloride flush  3 mL Intravenous Q12H  . [START ON 02/20/2015] spironolactone  12.5 mg Oral Daily    Infusions:    Allergies  Allergen Reactions  . Doxycycline Nausea And Vomiting  . Latex Hives  . Ciprofloxacin Rash  . Fish Allergy Rash    Only reaction to Mackerel.  No other issues with any type of fish or shellfish currently   . Hydrocortisone Rash  . Neomycin Rash  . Sulfonamide Derivatives Swelling and Rash    Social History   Social History  . Marital Status: Widowed    Spouse Name: N/A  . Number of Children: 2  . Years of Education: N/A   Occupational History  . Retired    Social History Main Topics  . Smoking status: Never Smoker   . Smokeless tobacco: Never Used  . Alcohol Use: No  . Drug Use: No  . Sexual Activity: Not on file   Other Topics Concern  . Not on file   Social History Narrative   Charity fundraiser, Oceanographer.   Widowed, lives with son.    Family History  Problem Relation Age of Onset  . Coronary artery disease      1st degree relatvie <50  . Breast cancer      aunt- ? paternal or maternal  . Asthma Sister   . Heart disease Mother   . Heart attack Mother   . Hypertension Mother   . Stroke Neg Hx     PHYSICAL EXAM: Filed Vitals:   02/19/15 1230 02/19/15 1300  BP: 143/65 191/94  Pulse: 71 63  Temp:  98 F (36.7 C)  Resp: 23 20    No intake or output data in the 24 hours ending 02/19/15 1423  General:  Morbidly obese woman sitting  up on side of bed using her phone Talking in full sentences. No respiratory difficulty HEENT: normal Neck: supple. Thick JVP hard to see appears about 8. Carotids 2+ bilat; no bruits. No lymphadenopathy or thryomegaly appreciated. Cor: PMI nondisplaced. Regular rate & rhythm. 2/6 SEM at rusb Lungs: clear Abdomen: markedly obese soft, nontender, nondistended. Unable to assess for hepatosplenomegaly. No bruits or masses. Good bowel sounds. Extremities: no cyanosis, clubbing, rash, 1+ edema Neuro: alert & oriented x 3, cranial nerves grossly intact. moves all 4 extremities w/o difficulty. Affect pleasant.   Results for orders placed or performed during the hospital encounter of 02/19/15 (from the past 24 hour(s))  Basic metabolic panel     Status: Abnormal   Collection Time: 02/19/15  9:10 AM  Result Value Ref Range   Sodium 139 135 - 145 mmol/L   Potassium 3.3 (L) 3.5 - 5.1 mmol/L  Chloride 103 101 - 111 mmol/L   CO2 27 22 - 32 mmol/L   Glucose, Bld 112 (H) 65 - 99 mg/dL   BUN 14 6 - 20 mg/dL   Creatinine, Ser 0.56 0.44 - 1.00 mg/dL   Calcium 8.6 (L) 8.9 - 10.3 mg/dL   GFR calc non Af Amer >60 >60 mL/min   GFR calc Af Amer >60 >60 mL/min   Anion gap 9 5 - 15  CBC     Status: None   Collection Time: 02/19/15  9:10 AM  Result Value Ref Range   WBC 8.8 4.0 - 10.5 K/uL   RBC 4.05 3.87 - 5.11 MIL/uL   Hemoglobin 12.1 12.0 - 15.0 g/dL   HCT 37.9 36.0 - 46.0 %   MCV 93.6 78.0 - 100.0 fL   MCH 29.9 26.0 - 34.0 pg   MCHC 31.9 30.0 - 36.0 g/dL   RDW 14.8 11.5 - 15.5 %   Platelets 290 150 - 400 K/uL  Brain natriuretic peptide     Status: None   Collection Time: 02/19/15  9:10 AM  Result Value Ref Range   B Natriuretic Peptide 52.0 0.0 - 100.0 pg/mL  I-stat troponin, ED (not at [facility], [facility])     Status: None   Collection Time: 02/19/15  9:15 AM  Result Value Ref Range   Troponin i, poc 0.01 0.00 - 0.08 ng/mL   Comment 3          Troponin I (q 6hr x 3)     Status: None   Collection  Time: 02/19/15 12:20 PM  Result Value Ref Range   Troponin I <0.03 <0.031 ng/mL   Dg Chest 2 View  02/19/2015  CLINICAL DATA:  Midchest pain for 5 days. EXAM: CHEST  2 VIEW COMPARISON:  04/11/2014. FINDINGS: The cardio pericardial silhouette is enlarged. There is pulmonary vascular congestion without overt pulmonary edema. No overt airspace pulmonary edema or focal lung consolidation. No pleural effusion. The visualized bony structures of the thorax are intact. Telemetry leads overlie the chest. IMPRESSION: Stable.  Cardiomegaly with vascular congestion. Electronically Signed   By: Misty Stanley M.D.   On: 02/19/2015 09:59     ASSESSMENT: 1. Chest pain   --normal cath 2001   --ECG and troponin normal 2. Acute on chronic diastolic HF 3. Morbid obesity 4. HTN 5. OSA  6. Hypokalemia  PLAN/DISCUSSION:  I suspect her symptoms are mostly related to mild HF. Her BNP may be artificially low due to her obesity. Will start with lasix 80 IV bid and see how she responds. Will continue to cycle troponins. If troponins + or CP persists may warrant repeat coronary angiography. Continue with CPAP. Continue ASA and statin. No objective evidence of ischemia so would not start heparin. Needs weight loss. Supp K+.  Milon Dethloff,MD 2:47 PM

## 2015-02-19 NOTE — ED Notes (Addendum)
Pt c/o L side chest pressure and SOB x 5 days.  Pain score 8/10.  Pt reports SOB increases w/ exertion and lying.  Hx of sleep apnea, CHF and HTN.  Sts this feels like an exacerbation.  Pt reports "my doctor told me that I should double my Lasix, if my fluid gets bad and I did.  It hasn't helped."

## 2015-02-19 NOTE — ED Notes (Signed)
Pt reports that she has increased her Lasix and have been urinating more frequently and feels that it may have helped some. Pt is A&O and in NAD. Dr Vallery Ridge at bedside

## 2015-02-19 NOTE — ED Provider Notes (Signed)
CSN: [id number]     Arrival date & time 02/19/15  [id number] History   First MD Initiated Contact with Patient 02/19/15 [phone]     Chief Complaint  Patient presents with  . Chest Pain  . Shortness of Breath     (Consider location/radiation/quality/duration/timing/severity/associated sxs/prior Treatment) HPI Patient reports that 5 days ago when she was walking from her car into work, her chest became very heavy and she felt like there was something sitting on it. She felt very short of breath. Once she got into work, she had to rest for about 10 minutes before she could resume her activities. She reports that she has been getting increasing shortness of breath and cough productive of white phlegm. She reports she has a history of congestive heart failure and she thought that was the problem. She reports she has increased her Lasix dose over the past 4 days but is not getting improvement in her shortness of breath. She reports she has gone from taking 160 mg of Lasix daily to 200 mg daily. Patient reported that her weight was 303 pounds when she increased her Lasix dosing and she estimates she's had a 1 pound weight loss. She states she has had swelling in her legs. Chest pain has been intermittent in nature on the left side of the chest. It's more pronounced with shortness of breath. Patient has not taken today's medications. She reports she has been otherwise compliant with regular medications. Past Medical History  Diagnosis Date  . Common migraine   . Morbid obesity ([place])   . Hyperlipidemia   . OSA (obstructive sleep apnea)     CPAP dependent  . Carpal tunnel syndrome   . Hypertension   . Allergic rhinitis   . CAD (coronary artery disease)     LHC (11/29/1999):  EF 60%; no significant CAD.  [name] Chronic diastolic CHF (congestive heart failure) ([place])     a. Echo (01/21/11):  Vigorous LVF, EF 65-70%, no RWMA, Gr 2 DD. ;  b.  Echo (12/15): Moderate LVH, EF 123456, grade 2 diastolic dysfunction, mild  LAE, normal RV function  . Diverticulitis   . Acute on chronic diastolic CHF (congestive heart failure) ([place]) 04/11/2014  . Sinusitis, chronic 03/10/2012    CT sinuses 02/2012:  Chronic sinusitis with small A/F levels >> rx by ENT with prolonged augmentin F/u ct sinuses 04/2012:  Persistent sinus thickening >> rx with levaquin and clinda for 30 days.  Told to return for sinus scan in 6 weeks >> no showed for followup visit.  CT sinuses 11/2013:  Acute and chronic sinusitis noted.    [name] PNEUMONIA, ORGAN UNSPECIFIED 03/01/2009    Qualifier: Diagnosis of  By: [name] CMA, [name]    . Acute suppurative otitis media without spontaneous rupture of eardrum 02/04/2007    Centricity Description: OTITIS MEDIA, SUPPURATIVE, ACUTE, BILATERAL Qualifier: Diagnosis of  By: [name] MD, [name]  Centricity Description: OTITIS MEDIA, SUPPURATIVE, ACUTE Qualifier: Diagnosis of  By: [name] MD, [name]   . Carpal tunnel syndrome 06/09/2006    Qualifier: Diagnosis of  By: Marca Ancona RMA, Lucy    . IBS (irritable bowel syndrome)   . Diastolic heart failure ([place])   . Hepatic steatosis   . Hiatal hernia   . Hepatomegaly   . Internal hemorrhoids    Past Surgical History  Procedure Laterality Date  . Tubal ligation    . Tonsillectomy    . Uvulopalatopharyngoplasty    . Cesarean section    .  Nasal sinus surgery    . Partial hysterectomy     Family History  Problem Relation Age of Onset  . Coronary artery disease      1st degree relatvie <50  . Breast cancer      aunt- ? paternal or maternal  . Asthma Sister   . Heart disease Mother   . Heart attack Mother   . Hypertension Mother   . Stroke Neg Hx    Social History  Substance Use Topics  . Smoking status: Never Smoker   . Smokeless tobacco: Never Used  . Alcohol Use: No   OB History    No data available     Review of Systems  10 Systems reviewed and are negative for acute change except as noted in the HPI.   Allergies  Doxycycline; Latex;  Ciprofloxacin; Fish allergy; Hydrocortisone; Neomycin; and Sulfonamide derivatives  Home Medications   Prior to Admission medications   Medication Sig Start Date End Date Taking? Authorizing Provider  albuterol (PROVENTIL HFA;VENTOLIN HFA) 108 (90 BASE) MCG/ACT inhaler Inhale 1-2 puffs into the lungs every 6 (six) hours as needed for wheezing or shortness of breath. 03/20/14  Yes Ripley Fraise, MD  bisacodyl (DULCOLAX) 5 MG EC tablet Take 15 mg by mouth daily as needed for moderate constipation.   Yes Historical Provider, MD  dextromethorphan-guaiFENesin (MUCINEX DM) 30-600 MG per 12 hr tablet Take 1 tablet by mouth 2 (two) times daily.   Yes Historical Provider, MD  furosemide (LASIX) 40 MG tablet Take 2 tablets (80 mg total) by mouth 2 (two) times daily. For swelling or fluid retention. 09/01/14  Yes Minus Breeding, MD  losartan (COZAAR) 100 MG tablet Take 1 tablet (100 mg total) by mouth daily. 10/13/14  Yes Minus Breeding, MD  metolazone (ZAROXOLYN) 2.5 MG tablet Take 1 tablet (2.5 mg total) by mouth daily as needed. see notes 11/29/14  Yes Minus Breeding, MD  naproxen sodium (ANAPROX) 220 MG tablet Take 220 mg by mouth 2 (two) times daily with a meal.   Yes Historical Provider, MD  potassium chloride SA (KLOR-CON M20) 20 MEQ tablet Take 5 tablets (100 mEq total) by mouth daily. 06/13/14  Yes Scott T Kathlen Mody, PA-C  amoxicillin-clavulanate (AUGMENTIN) 875-125 MG tablet Take 1 tablet by mouth 2 (two) times daily. Patient not taking: Reported on 02/19/2015 01/11/15   Tanda Rockers, MD  spironolactone (ALDACTONE) 25 MG tablet Take 0.5 tablets (12.5 mg total) by mouth daily. Patient not taking: Reported on 02/19/2015 05/18/14   Minus Breeding, MD   BP 139/59 mmHg  Pulse 81  Temp(Src) 98.3 F (36.8 C) (Oral)  Resp 17  SpO2 97% Physical Exam  Constitutional: She is oriented to person, place, and time.  Patient is morbidly obese. She is alert and nontoxic. No acute respiratory distress at rest.   HENT:  Head: Normocephalic and atraumatic.  Mouth/Throat: Oropharynx is clear and moist.  Eyes: EOM are normal. Pupils are equal, round, and reactive to light.  Neck: Neck supple.  Cardiovascular: Normal rate, regular rhythm, normal heart sounds and intact distal pulses.   Pulmonary/Chest: Effort normal and breath sounds normal.  Abdominal: Soft. Bowel sounds are normal. She exhibits no distension. There is no tenderness.  Musculoskeletal: Normal range of motion. She exhibits edema.  Patient has trace edema at bilateral lower extremities.   Neurological: She is alert and oriented to person, place, and time. She has normal strength. Coordination normal. GCS eye subscore is 4. GCS verbal subscore is 5. GCS  motor subscore is 6.  Skin: Skin is warm, dry and intact.  Psychiatric: She has a normal mood and affect.    ED Course  Procedures (including critical care time) Labs Review Labs Reviewed  BASIC METABOLIC PANEL - Abnormal; Notable for the following:    Potassium 3.3 (*)    Glucose, Bld 112 (*)    Calcium 8.6 (*)    All other components within normal limits  CBC  BRAIN NATRIURETIC PEPTIDE  I-STAT TROPOININ, ED    Imaging Review Dg Chest 2 View  02/19/2015  CLINICAL DATA:  Midchest pain for 5 days. EXAM: CHEST  2 VIEW COMPARISON:  04/11/2014. FINDINGS: The cardio pericardial silhouette is enlarged. There is pulmonary vascular congestion without overt pulmonary edema. No overt airspace pulmonary edema or focal lung consolidation. No pleural effusion. The visualized bony structures of the thorax are intact. Telemetry leads overlie the chest. IMPRESSION: Stable.  Cardiomegaly with vascular congestion. Electronically Signed   By: Misty Stanley M.D.   On: 02/19/2015 09:59   I have personally reviewed and evaluated these images and lab results as part of my medical decision-making.   EKG Interpretation   Date/Time:  Sunday February 19 2015 08:52:58 EST Ventricular Rate:  74 PR  Interval:  175 QRS Duration: 89 QT Interval:  399 QTC Calculation: 443 R Axis:   111 Text Interpretation:  Sinus rhythm Right axis deviation Low voltage,  precordial leads Nonspecific T abnormalities, lateral leads agree. no sig  change from previous. Confirmed by Johnney Killian, MD, Jeannie Done [phone]) on  02/19/2015 10:52:54 AM     Consult: His case was reviewed with Dr. Tamala Julian of cardiology. At this time he advises patient be transferred to [facility] for admission to hospitalist. Consult: Hospitalist for admission. MDM   Final diagnoses:  Chest pain, unspecified chest pain type  Acute on chronic congestive heart failure, unspecified congestive heart failure type ([place])  Morbid obesity, unspecified obesity type [facility])   Patient presents with exertional chest pressure and increasing dyspnea. She does have history of congestive heart failure and has increased Lasix doses at home without improvement of symptoms. Since a significant episode of chest pain 5 days ago, her dyspnea has increased and the chest pain has been waxing and waning. Days pressure is not as severe. Patient presents hypertensive but has not taken home medications. Patient has responded positively to nitroglycerin and administration of her losartan. With the nitroglycerin she reported no chest discomfort. She did however insisted it be removed due to headache.     Charlesetta Shanks, MD 02/19/15 [phone]

## 2015-02-19 NOTE — H&P (Signed)
History and Physical:    [name]   [id number] DOB: 1960/08/03 DOA: 02/19/2015  Referring MD/provider: Dr. Johnney Killian PCP: Bartholome Bill, MD   Chief Complaint: Chest pressure  History of Present Illness:   [name] is an 55 y.o. female with a PMH of chronic diastolic CHF, last echo done 03/2014 which showed EF 60-65 percent and grade 2 diastolic dysfunction who presents today for evaluation of chest pressure which began 5 days ago when she was walking from her car into work.  She states that her chest became very heavy and she felt like there was something sitting on it. She felt very short of breath. Once she got into work, she had to rest for about 10 minutes before she could resume her activities. She reports that she has been getting increasing shortness of breath and has had cough productive of white phlegm. She reports she has increased her Lasix dose over the past 4 days but is not getting improvement in her shortness of breath. She reports she has gone from taking 160 mg of Lasix daily to 200 mg daily. Patient reported that her weight was 303 pounds when she increased her Lasix dosing and she estimates she's had a 1 pound weight loss. She states she has had swelling in her legs. The patient awoke with chest pressure last night at 3:00 a.m., and sat up for 3 hours.  Took an extra fluid pill without relief.  Feels like she cannot take a deep breath in.  Taking deep breaths in eases pain. Of note, the patient was taken off Spironolactone 2 months ago by Dr. Kathlen Mody. The EDP consulted Dr. Tamala Julian of cardiology requests transfer to [facility] for further evaluation.  ROS:   Review of Systems  Constitutional: Positive for malaise/fatigue. Negative for fever, chills and weight loss.  Eyes: Negative.   Respiratory: Positive for cough, sputum production and shortness of breath.   Cardiovascular: Positive for chest pain, orthopnea, leg swelling and PND. Negative for palpitations and  claudication.  Gastrointestinal: Negative for heartburn, nausea, vomiting, abdominal pain, diarrhea, constipation, blood in stool and melena.  Genitourinary: Negative.   Musculoskeletal:       Left leg "got numb" today.  Skin: Negative.   Neurological: Positive for tingling, weakness and headaches. Negative for speech change and focal weakness.       Left leg  Endo/Heme/Allergies: Negative.   Psychiatric/Behavioral: Negative.      Past Medical History:   Past Medical History  Diagnosis Date  . Common migraine   . Morbid obesity ([place])   . Hyperlipidemia   . OSA (obstructive sleep apnea)     CPAP dependent  . Carpal tunnel syndrome   . Hypertension   . Allergic rhinitis   . CAD (coronary artery disease)     LHC (11/29/1999):  EF 60%; no significant CAD.  [name] Chronic diastolic CHF (congestive heart failure) ([place])     a. Echo (01/21/11):  Vigorous LVF, EF 65-70%, no RWMA, Gr 2 DD. ;  b.  Echo (12/15): Moderate LVH, EF 123456, grade 2 diastolic dysfunction, mild LAE, normal RV function  . Diverticulitis   . Acute on chronic diastolic CHF (congestive heart failure) ([place]) 04/11/2014  . Sinusitis, chronic 03/10/2012    CT sinuses 02/2012:  Chronic sinusitis with small A/F levels >> rx by ENT with prolonged augmentin F/u ct sinuses 04/2012:  Persistent sinus thickening >> rx with levaquin and clinda for 30 days.  Told to return for  sinus scan in 6 weeks >> no showed for followup visit.  CT sinuses 11/2013:  Acute and chronic sinusitis noted.    [name] PNEUMONIA, ORGAN UNSPECIFIED 03/01/2009    Qualifier: Diagnosis of  By: [name] CMA, [name]    . Acute suppurative otitis media without spontaneous rupture of eardrum 02/04/2007    Centricity Description: OTITIS MEDIA, SUPPURATIVE, ACUTE, BILATERAL Qualifier: Diagnosis of  By: [name] MD, [name]  Centricity Description: OTITIS MEDIA, SUPPURATIVE, ACUTE Qualifier: Diagnosis of  By: [name] MD, [name]   . Carpal tunnel syndrome 06/09/2006    Qualifier:  Diagnosis of  By: Marca Ancona RMA, Lucy    . IBS (irritable bowel syndrome)   . Diastolic heart failure ([place])   . Hepatic steatosis   . Hiatal hernia   . Hepatomegaly   . Internal hemorrhoids     Past Surgical History:   Past Surgical History  Procedure Laterality Date  . Tubal ligation    . Tonsillectomy    . Uvulopalatopharyngoplasty    . Cesarean section    . Nasal sinus surgery    . Partial hysterectomy      Social History:   Social History   Social History  . Marital Status: Widowed    Spouse Name: N/A  . Number of Children: 2  . Years of Education: N/A   Occupational History  . Retired    Social History Main Topics  . Smoking status: Never Smoker   . Smokeless tobacco: Never Used  . Alcohol Use: No  . Drug Use: No  . Sexual Activity: Not on file   Other Topics Concern  . Not on file   Social History Narrative   Charity fundraiser, Oceanographer.   Widowed, lives with son.    Family history:   Family History  Problem Relation Age of Onset  . Coronary artery disease      1st degree relatvie <50  . Breast cancer      aunt- ? paternal or maternal  . Asthma Sister   . Heart disease Mother   . Heart attack Mother   . Hypertension Mother   . Stroke Neg Hx     Allergies   Doxycycline; Latex; Ciprofloxacin; Fish allergy; Hydrocortisone; Neomycin; and Sulfonamide derivatives  Current Medications:   Prior to Admission medications   Medication Sig Start Date End Date Taking? Authorizing Provider  albuterol (PROVENTIL HFA;VENTOLIN HFA) 108 (90 BASE) MCG/ACT inhaler Inhale 1-2 puffs into the lungs every 6 (six) hours as needed for wheezing or shortness of breath. 03/20/14  Yes Ripley Fraise, MD  bisacodyl (DULCOLAX) 5 MG EC tablet Take 15 mg by mouth daily as needed for moderate constipation.   Yes Historical Provider, MD  dextromethorphan-guaiFENesin (MUCINEX DM) 30-600 MG per 12 hr tablet Take 1 tablet by mouth 2 (two) times daily.   Yes Historical  Provider, MD  furosemide (LASIX) 40 MG tablet Take 2 tablets (80 mg total) by mouth 2 (two) times daily. For swelling or fluid retention. 09/01/14  Yes Minus Breeding, MD  losartan (COZAAR) 100 MG tablet Take 1 tablet (100 mg total) by mouth daily. 10/13/14  Yes Minus Breeding, MD  metolazone (ZAROXOLYN) 2.5 MG tablet Take 1 tablet (2.5 mg total) by mouth daily as needed. see notes 11/29/14  Yes Minus Breeding, MD  naproxen sodium (ANAPROX) 220 MG tablet Take 220 mg by mouth 2 (two) times daily with a meal.   Yes Historical Provider, MD  potassium chloride SA (KLOR-CON M20) 20 MEQ tablet  Take 5 tablets (100 mEq total) by mouth daily. 06/13/14  Yes Scott T Kathlen Mody, PA-C  amoxicillin-clavulanate (AUGMENTIN) 875-125 MG tablet Take 1 tablet by mouth 2 (two) times daily. Patient not taking: Reported on 02/19/2015 01/11/15   Tanda Rockers, MD  spironolactone (ALDACTONE) 25 MG tablet Take 0.5 tablets (12.5 mg total) by mouth daily. Patient not taking: Reported on 02/19/2015 05/18/14   Minus Breeding, MD    Physical Exam:   Filed Vitals:   02/19/15 1100 02/19/15 1115 02/19/15 1130 02/19/15 1200  BP: 149/90  139/59 138/65  Pulse: 78 68 81 71  Temp:      TempSrc:      Resp: 20 23 17 22   SpO2: 95% 96% 97% 97%     Physical Exam: Blood pressure 138/65, pulse 71, temperature 98.3 F (36.8 C), temperature source Oral, resp. rate 22, SpO2 97 %. Gen: Mild respiratory distress with movement.  Morbidly obese. Head: Normocephalic, atraumatic. Eyes: PERRL, EOMI, sclerae nonicteric. Mouth: Oropharynx clear, good dentition. Neck: Supple, no thyromegaly, no lymphadenopathy, no jugular venous distention. Chest: Lungs CTAB. CV: Heart sounds are regular.  No M/R/G. Abdomen: Soft, nontender, nondistended with normal active bowel sounds. Extremities: Extremities show 1+ edema. Skin: Warm and dry. Neuro: Alert and oriented times 3; grossly nonfocal. Psych: Mood and affect normal.   Data Review:     Labs: Basic Metabolic Panel:  Recent Labs Lab 02/19/15 0910  NA 139  K 3.3*  CL 103  CO2 27  GLUCOSE 112*  BUN 14  CREATININE 0.56  CALCIUM 8.6*   Liver Function Tests: No results for input(s): AST, ALT, ALKPHOS, BILITOT, PROT, ALBUMIN in the last 168 hours. No results for input(s): LIPASE, AMYLASE in the last 168 hours. No results for input(s): AMMONIA in the last 168 hours. CBC:  Recent Labs Lab 02/19/15 0910  WBC 8.8  HGB 12.1  HCT 37.9  MCV 93.6  PLT 290    Radiographic Studies: Dg Chest 2 View  02/19/2015  CLINICAL DATA:  Midchest pain for 5 days. EXAM: CHEST  2 VIEW COMPARISON:  04/11/2014. FINDINGS: The cardio pericardial silhouette is enlarged. There is pulmonary vascular congestion without overt pulmonary edema. No overt airspace pulmonary edema or focal lung consolidation. No pleural effusion. The visualized bony structures of the thorax are intact. Telemetry leads overlie the chest. IMPRESSION: Stable.  Cardiomegaly with vascular congestion. Electronically Signed   By: Misty Stanley M.D.   On: 02/19/2015 09:59   *I have personally reviewed the images above*  EKG: Independently reviewed. Sinus rhythm at 74 bpm. Nonspecific T-wave abnormalities in lateral leads. No significant change from prior.   Assessment/Plan:   Principal Problem:   Chest pressure/tightness in a patient with Diastolic CHF, acute on chronic ([place]) with abnormal EKG - CXR shows: Vascular congestion.  BNP not elevated at 52. - Evaluate for ACS with EKG/serial troponins. - EKG personally reviewed and is unchanged from baseline. - Last 2 D Echocardiogram done 03/2014 and showed an EF of 60-65 %, grade 2 diastolic dysfunction. - Provide supplemental oxygen to maintain oxygen saturations of >90%. - Patient unable to tolerate nitroglycerin secondary to headache, will start BiDil. - Start lasix at  80 mg IV Q 12.  (Home regimen: 80 mg twice a day) - We'll transfer to [facility] for cardiology  consultation at the request of Dr. Tamala Julian. - Continue spironolactone, Zaroxolyn and Cozaar. Add aspirin.  Active Problems:   ? History of Type 2 diabetes mellitus (HCC)/morbid obesity - Does not appear  to be on any medications to treat diabetes. Last hemoglobin A1c was done in 2009 and was 6.2%. - Repeat hemoglobin A1c.    Hyperlipidemia - Does not appear to be on any medications to treat this. Check fasting lipid panel in the morning.    HYPOKALEMIA - Place on supplementation.    Essential hypertension - Monitor blood pressure. Continue Lasix, Cozaar, Zaroxolyn and spironolactone. - Consider adding a beta blocker.    DVT prophylaxis - Lovenox ordered.  Code Status / Family Communication / Disposition Plan:   Code Status: Full. Family Communication: No family present at the bedside. [id number], Artist, son, his emergency contact. Disposition Plan: Home when source of chest pressure only sedated and fully treated, likely a three-day hospital stay.  Attestation regarding necessity of inpatient status:   The appropriate admission status for this patient is INPATIENT. Inpatient status is judged to be reasonable and necessary in order to provide the required intensity of service to ensure the patient's safety. The patient's presenting symptoms, physical exam findings, and initial radiographic and laboratory data in the context of their chronic comorbidities is felt to place them at high risk for further clinical deterioration. Furthermore, it is not anticipated that the patient will be medically stable for discharge from the hospital within 2 midnights of admission. The following factors support the admission status of inpatient.   -The patient's presenting symptoms include chest pressure/tightness accompanied by shortness of breath. - The worrisome physical exam findings include morbid obesity, dyspnea with minimal exertion. - The initial radiographic and laboratory data are worrisome  because of interstitial edema on chest x-ray, hypokalemia. - The chronic co-morbidities include chronic grade 2 diastolic CHF, morbid obesity, hypertension, hyperlipidemia. - Patient requires inpatient status due to high intensity of service, high risk for further deterioration and high frequency of surveillance required. - I certify that at the point of admission it is my clinical judgment that the patient will require inpatient hospital care spanning beyond 2 midnights from the point of admission.   Time spent: 70 minutes.  RAMA,CHRISTINA [facility] Pager [phone] Cell: [phone]   If 7PM-7AM, please contact night-coverage [email or web address] Password [facility] 02/19/2015, 12:43 PM

## 2015-02-19 NOTE — Progress Notes (Signed)
New pt admission from [facility] ED. Pt brought to the floor in stable condition via [facility]. Vitals taken. Initial Assessment done. All immediate pertinent needs to patient addressed. Patient Guide given to patient. Important safety instructions relating to hospitalization reviewed with patient. Patient verbalized understanding. Will continue to monitor pt.  Maurene Capes RN

## 2015-02-19 NOTE — ED Notes (Signed)
Attempted to call report to 3E at [facility], was told the nurse is at lunch and will call back

## 2015-02-19 NOTE — ED Notes (Signed)
Called [facility] to transport pt. 

## 2015-02-19 NOTE — Progress Notes (Signed)
RT setup patient on 12cmH2O CPAP via [facility]. Patient is tolerating at this time. RT will continue to monitor

## 2015-02-20 ENCOUNTER — Inpatient Hospital Stay (HOSPITAL_COMMUNITY): Payer: No Typology Code available for payment source

## 2015-02-20 DIAGNOSIS — E785 Hyperlipidemia, unspecified: Secondary | ICD-10-CM | POA: Diagnosis not present

## 2015-02-20 DIAGNOSIS — I509 Heart failure, unspecified: Secondary | ICD-10-CM | POA: Diagnosis not present

## 2015-02-20 DIAGNOSIS — I10 Essential (primary) hypertension: Secondary | ICD-10-CM

## 2015-02-20 DIAGNOSIS — R0789 Other chest pain: Secondary | ICD-10-CM | POA: Diagnosis not present

## 2015-02-20 DIAGNOSIS — I5033 Acute on chronic diastolic (congestive) heart failure: Secondary | ICD-10-CM | POA: Diagnosis not present

## 2015-02-20 LAB — BASIC METABOLIC PANEL
Anion gap: 12 (ref 5–15)
BUN: 18 mg/dL (ref 6–20)
CHLORIDE: 101 mmol/L (ref 101–111)
CO2: 27 mmol/L (ref 22–32)
CREATININE: 0.87 mg/dL (ref 0.44–1.00)
Calcium: 8.8 mg/dL — ABNORMAL LOW (ref 8.9–10.3)
GFR calc Af Amer: >60 mL/min (ref >60)
GLUCOSE: 124 mg/dL — AB (ref 65–99)
POTASSIUM: 3.5 mmol/L (ref 3.5–5.1)
SODIUM: 140 mmol/L (ref 135–145)

## 2015-02-20 LAB — LIPID PANEL
CHOL/HDL RATIO: 4.8 ratio
CHOLESTEROL: 169 mg/dL (ref 0–200)
HDL: 35 mg/dL — ABNORMAL LOW (ref >40)
LDL Cholesterol: 72 mg/dL (ref 0–99)
Triglycerides: 310 mg/dL — ABNORMAL HIGH (ref <150)
VLDL: 62 mg/dL — AB (ref 0–40)

## 2015-02-20 LAB — HEMOGLOBIN A1C
Hgb A1c MFr Bld: 5.8 % — ABNORMAL HIGH (ref 4.8–5.6)
Mean Plasma Glucose: 120 mg/dL

## 2015-02-20 LAB — TROPONIN I: Troponin I: <0.03 ng/mL (ref <0.031)

## 2015-02-20 MED ORDER — PERFLUTREN LIPID MICROSPHERE
1.0000 mL | INTRAVENOUS | Status: AC | PRN
  Administered 2015-02-20: 2 mL via INTRAVENOUS
  Filled 2015-02-20: qty 10

## 2015-02-20 MED ORDER — OMEGA-3-ACID ETHYL ESTERS 1 G PO CAPS
1.0000 g | ORAL_CAPSULE | Freq: Two times a day (BID) | ORAL | Status: DC
  Administered 2015-02-20 – 2015-02-23 (×7): 1 g via ORAL
  Filled 2015-02-20 (×7): qty 1

## 2015-02-20 MED ORDER — ROSUVASTATIN CALCIUM 20 MG PO TABS
20.0000 mg | ORAL_TABLET | Freq: Every day | ORAL | Status: DC
  Administered 2015-02-20 – 2015-02-22 (×3): 20 mg via ORAL
  Filled 2015-02-20 (×3): qty 1

## 2015-02-20 MED ORDER — ENOXAPARIN SODIUM 80 MG/0.8ML ~~LOC~~ SOLN
70.0000 mg | SUBCUTANEOUS | Status: DC
  Administered 2015-02-20 – 2015-02-22 (×3): 70 mg via SUBCUTANEOUS
  Filled 2015-02-20 (×3): qty 0.8

## 2015-02-20 MED ORDER — FLUTICASONE PROPIONATE 50 MCG/ACT NA SUSP
2.0000 | Freq: Two times a day (BID) | NASAL | Status: DC | PRN
  Filled 2015-02-20: qty 16

## 2015-02-20 MED ORDER — MORPHINE SULFATE (PF) 2 MG/ML IV SOLN
2.0000 mg | INTRAVENOUS | Status: DC | PRN
  Administered 2015-02-20 – 2015-02-23 (×4): 2 mg via INTRAVENOUS
  Filled 2015-02-20 (×5): qty 1

## 2015-02-20 MED ORDER — TRAMADOL HCL 50 MG PO TABS
50.0000 mg | ORAL_TABLET | Freq: Four times a day (QID) | ORAL | Status: DC | PRN
  Administered 2015-02-20 – 2015-02-21 (×3): 50 mg via ORAL
  Filled 2015-02-20 (×5): qty 1

## 2015-02-20 MED ORDER — SPIRONOLACTONE 25 MG PO TABS
12.5000 mg | ORAL_TABLET | Freq: Two times a day (BID) | ORAL | Status: DC
  Administered 2015-02-20 – 2015-02-23 (×6): 12.5 mg via ORAL
  Filled 2015-02-20 (×6): qty 1

## 2015-02-20 MED ORDER — ISOSORB DINITRATE-HYDRALAZINE 20-37.5 MG PO TABS
1.0000 | ORAL_TABLET | Freq: Three times a day (TID) | ORAL | Status: DC
  Administered 2015-02-20 – 2015-02-23 (×10): 1 via ORAL
  Filled 2015-02-20 (×10): qty 1

## 2015-02-20 NOTE — Progress Notes (Signed)
Subjective:  Breathing much better with diuresis; no chest pain  Objective:   Vital Signs : Filed Vitals:   02/20/15 0036 02/20/15 0531 02/20/15 1129 02/20/15 1131  BP: 149/57 109/44 112/87 121/61  Pulse: 74 63 63   Temp: 97.8 F (36.6 C) 97.4 F (36.3 C) 97.9 F (36.6 C)   TempSrc: Oral Oral Oral   Resp: _0 Height:      Weight:  310 lb 8 oz (140.842 kg)    SpO2: 94% 93% 93%     Intake/Output from previous day:  Intake/Output Summary (Last 24 hours) at 02/20/15 1141 Last data filed at 02/20/15 1117  Gross per 24 hour  Intake    960 ml  Output   3250 ml  Net  -2290 ml    I/O since admission: -2290  Wt Readings from Last 3 Encounters:  02/20/15 310 lb 8 oz (140.842 kg)  01/04/15 306 lb (138.801 kg)  11/29/14 313 lb 8 oz (142.203 kg)    Medications: . aspirin EC  81 mg Oral Daily  . dextromethorphan-guaiFENesin  1 tablet Oral BID  . furosemide  80 mg Intravenous BID  . isosorbide-hydrALAZINE  1 tablet Oral BID  . losartan  100 mg Oral Daily  . naproxen  250 mg Oral BID WC  . potassium chloride SA  40 mEq Oral TID  . sodium chloride flush  3 mL Intravenous Q12H  . spironolactone  12.5 mg Oral Daily       Physical Exam:   General appearance: alert, cooperative and no distress; super morbid obesity Neck: no adenopathy, no carotid bruit, no JVD, supple, symmetrical, trachea midline and thyroid not enlarged, symmetric, no tenderness/mass/nodules Lungs: no wheezing or rhochi Heart: regular rate and rhythm; 1/6 sem; no rub or diastolic murmur Abdomen: significant central adiposity; BS+; nontender Extremities: no edema, redness or tenderness in the calves or thighs Pulses: 2+ and symmetric Neurologic: Grossly normal   Rate: 75  Rhythm: normal sinus rhythm  ECG (independently read by me): NSR at 74; Right axis deviation; nonspecific T changes  Lab Results:   Recent Labs  02/19/15 0910 02/20/15 0440  NA 139 140  K 3.3* 3.5  CL 103 101  CO2  27 27  GLUCOSE 112* 124*  BUN 14 18  CREATININE 0.56 0.87  CALCIUM 8.6* 8.8*    Hepatic Function Latest Ref Rng 02/19/2015 04/11/2014 02/11/2013  Total Protein 6.5 - 8.1 g/dL 7.6 6.7 9.4(H)  Albumin 3.5 - 5.0 g/dL 3.2(L) 3.0(L) 4.2  AST 15 - 41 U/L 24 44(H) 24  ALT 14 - 54 U/L 20 33 22  Alk Phosphatase 38 - 126 U/L 77 68 77  Total Bilirubin 0.3 - 1.2 mg/dL 0.9 1.7(H) 1.0  Bilirubin, Direct 0.1 - 0.5 mg/dL 0.2 - -     Recent Labs  02/19/15 0910  WBC 8.8  HGB 12.1  HCT 37.9  MCV 93.6  PLT 290     Recent Labs  02/19/15 1220 02/19/15 1904 02/19/15 2342  TROPONINI <0.03 <0.03 <0.03    Lab Results  Component Value Date   TSH 2.18 12/31/2013    Recent Labs  02/19/15 1450  HGBA1C 5.8*     Recent Labs  02/19/15 1450  PROT 7.6  ALBUMIN 3.2*  AST 24  ALT 20  ALKPHOS 77  BILITOT 0.9  BILIDIR 0.2  IBILI 0.7   No results for input(s): INR in the last 72 hours. BNP (last 3 results)  Recent Labs  04/11/14 0703 02/19/15 0910  BNP 31.0 52.0    ProBNP (last 3 results) No results for input(s): PROBNP in the last 8760 hours.   Lipid Panel     Component Value Date/Time   CHOL 169 02/20/2015 0440   TRIG 310* 02/20/2015 0440   HDL 35* 02/20/2015 0440   CHOLHDL 4.8 02/20/2015 0440   VLDL 62* 02/20/2015 0440   LDLCALC 72 02/20/2015 0440     Imaging:  Dg Chest 2 View  02/19/2015  CLINICAL DATA:  Midchest pain for 5 days. EXAM: CHEST  2 VIEW COMPARISON:  04/11/2014. FINDINGS: The cardio pericardial silhouette is enlarged. There is pulmonary vascular congestion without overt pulmonary edema. No overt airspace pulmonary edema or focal lung consolidation. No pleural effusion. The visualized bony structures of the thorax are intact. Telemetry leads overlie the chest. IMPRESSION: Stable.  Cardiomegaly with vascular congestion. Electronically Signed   By: Misty Stanley M.D.   On: 02/19/2015 09:59      Assessment/Plan:   Principal Problem:   Diastolic CHF,  acute on chronic [facility]) Active Problems:   Type 2 diabetes mellitus (HCC)   Hyperlipidemia   HYPOKALEMIA   Morbid obesity (HCC)   Essential hypertension   Abnormal EKG   Chest tightness or pressure  1. Diastolic CHF acute on chronic 2. Morbid Obesity 3. OSA on CPAP 4. Essential HTN 5. Hypokalemia: 3.3 to 3.5 6. Mixed hyperlipidemia  Excellent diuresis with net -2290 since admission.  Suspect diastolic heart failure contributing to symptoms; no recurrent chest pain.  Prior to admission, patient had experienced PND and orthopnea for several nights. Slept much better last evening.  Will DC NSAID Rx. Will increase spironolactone to 12.5 mg bid. Continue lasix;  change bidil to tid from bid. For echo to re-assess LV systolic and diastolic function. Will start statin and fish oil with atherogenic dyslipidemic lipid panel.  Troy Sine, MD, [facility] 02/20/2015, 11:41 AM

## 2015-02-20 NOTE — Progress Notes (Addendum)
  Echocardiogram 2D Echocardiogram with Definity has been performed.  Jennette Dubin 02/20/2015, 3:12 PM

## 2015-02-20 NOTE — Care Management Note (Signed)
Case Management Note  Patient Details  Name: [name] MRN: [id number] Date of Birth: 19-May-1960  Subjective/Objective:        CHF, SOB, Chest pain            Action/Plan:  UR review complete. NCM will continue to follow for dc needs.   Expected Discharge Date:  02/21/2015              Expected Discharge Plan:  Home/Self Care  In-House Referral:  NA  Discharge planning Services  CM Consult  Post Acute Care Choice:  NA Choice offered to:  NA  DME Arranged:  N/A DME Agency:  NA  HH Arranged:  NA HH Agency:  NA  Status of Service:  Completed, signed off  Medicare Important Message Given:    Date Medicare IM Given:    Medicare IM give by:    Date Additional Medicare IM Given:    Additional Medicare Important Message give by:     If discussed at [place] of Stay Meetings, dates discussed:    Additional Comments:  Erenest Rasher, RN 02/20/2015, 11:31 AM

## 2015-02-20 NOTE — Progress Notes (Addendum)
[facility] Progress Note   [name]  [id number]  DOB: December 06, 1960  DOA: 02/19/2015 PCP: Bartholome Bill, MD  Brief narrative: [name] is a 55 y.o. female with grade 2 diastolic dysfunction and morbid obesity who presents with progressive shortness of breath noted on exertion. Despite increasing her daily Lasix dose, her dyspnea continued. She woke up last night with chest pressure which did not improve and she therefore presented to the hospital. She was admitted for CHF exacerbation. She states that she was taken off of spironolactone about 2 months ago.  Subjective: Chest tightness has resolved. No dyspnea at rest. Has pedal edema and a mild dry cough.  Assessment/Plan: Principal Problem:   Chest tightness expected to be secondary to Diastolic CHF, acute on chronic  -continue to diuresis per cardiology-in negative balance -Troponin 3 sets negative -Continue BiDil, Lasix, metolazone, Aldactone and potassium -Naproxen (mentioned on home medication list) should be discontinued completely  Active Problems: Glucose intolerance  -Last A1c 6.2-A1c yesterday 5.8     HYPOKALEMIA --Replaced-continue to follow     Morbid obesity -Body mass index is 55.02 kg/(m^2).    Essential hypertension -Normotensive-continue BiDil and losartan  Hypertriglyceridemia/low HDL -Has been started on fish oil and statin   Antibiotics: Anti-infectives    None     Code Status:     Code Status Orders        Start     Ordered   02/19/15 1342  Full code   Continuous     02/19/15 1341    Code Status History    Date Active Date Inactive Code Status Order ID Comments User Context   04/11/2014 11:14 AM 04/13/2014  1:30 PM Full Code [id number]  Nita Sells, MD Inpatient     Family Communication: Disposition Plan:  home in 1-2 days DVT prophylaxis: Lovenox  Consultants:  cardiology Procedures:     Objective: Filed Weights   02/19/15 1300 02/20/15 0531   Weight: 139.4 kg (307 lb 5.1 oz) 140.842 kg (310 lb 8 oz)    Intake/Output Summary (Last 24 hours) at 02/20/15 1213 Last data filed at 02/20/15 1117  Gross per 24 hour  Intake    960 ml  Output   3250 ml  Net  -2290 ml     Vitals Filed Vitals:   02/20/15 0036 02/20/15 0531 02/20/15 1129 02/20/15 1131  BP: 149/57 109/44 112/87 121/61  Pulse: 74 63 63   Temp: 97.8 F (36.6 C) 97.4 F (36.3 C) 97.9 F (36.6 C)   TempSrc: Oral Oral Oral   Resp: 19 18 18    Height:      Weight:  140.842 kg (310 lb 8 oz)    SpO2: 94% 93% 93%     Exam:  General:  Pt is alert, not in acute distress  HEENT: No icterus, No thrush, oral mucosa moist  Cardiovascular: regular rate and rhythm, S1/S2 No murmur  Respiratory: clear to auscultation bilaterally   Abdomen: Soft, +Bowel sounds, non tender, non distended, no guarding  MSK: No cyanosis or clubbing- + pedal edema   Data Reviewed: Basic Metabolic Panel:  Recent Labs Lab 02/19/15 0910 02/20/15 0440  NA 139 140  K 3.3* 3.5  CL 103 101  CO2 27 27  GLUCOSE 112* 124*  BUN 14 18  CREATININE 0.56 0.87  CALCIUM 8.6* 8.8*   Liver Function Tests:  Recent Labs Lab 02/19/15 1450  AST 24  ALT 20  ALKPHOS 77  BILITOT 0.9  PROT  7.6  ALBUMIN 3.2*   No results for input(s): LIPASE, AMYLASE in the last 168 hours. No results for input(s): AMMONIA in the last 168 hours. CBC:  Recent Labs Lab 02/19/15 0910  WBC 8.8  HGB 12.1  HCT 37.9  MCV 93.6  PLT 290   Cardiac Enzymes:  Recent Labs Lab 02/19/15 1220 02/19/15 1904 02/19/15 2342  TROPONINI <0.03 <0.03 <0.03   BNP (last 3 results)  Recent Labs  04/11/14 0703 02/19/15 0910  BNP 31.0 52.0    ProBNP (last 3 results) No results for input(s): PROBNP in the last 8760 hours.  CBG: No results for input(s): GLUCAP in the last 168 hours.  No results found for this or any previous visit (from the past 240 hour(s)).   Studies: Dg Chest 2 View  02/19/2015   CLINICAL DATA:  Midchest pain for 5 days. EXAM: CHEST  2 VIEW COMPARISON:  04/11/2014. FINDINGS: The cardio pericardial silhouette is enlarged. There is pulmonary vascular congestion without overt pulmonary edema. No overt airspace pulmonary edema or focal lung consolidation. No pleural effusion. The visualized bony structures of the thorax are intact. Telemetry leads overlie the chest. IMPRESSION: Stable.  Cardiomegaly with vascular congestion. Electronically Signed   By: Misty Stanley M.D.   On: 02/19/2015 09:59    Scheduled Meds:  Scheduled Meds: . aspirin EC  81 mg Oral Daily  . dextromethorphan-guaiFENesin  1 tablet Oral BID  . furosemide  80 mg Intravenous BID  . isosorbide-hydrALAZINE  1 tablet Oral TID  . losartan  100 mg Oral Daily  . omega-3 acid ethyl esters  1 g Oral BID  . potassium chloride SA  40 mEq Oral TID  . rosuvastatin  20 mg Oral q1800  . sodium chloride flush  3 mL Intravenous Q12H  . spironolactone  12.5 mg Oral BID   Continuous Infusions:   Time spent on care of this patient:  35  min   [place], MD 02/20/2015, 12:13 PM  LOS: 1 day   [facility] Office  [phone] Pager - Text Page per [email or web address] If 7PM-7AM, please contact night-coverage [email or web address]

## 2015-02-21 DIAGNOSIS — R0789 Other chest pain: Secondary | ICD-10-CM | POA: Diagnosis not present

## 2015-02-21 DIAGNOSIS — E785 Hyperlipidemia, unspecified: Secondary | ICD-10-CM | POA: Diagnosis not present

## 2015-02-21 DIAGNOSIS — I10 Essential (primary) hypertension: Secondary | ICD-10-CM | POA: Diagnosis not present

## 2015-02-21 DIAGNOSIS — I5033 Acute on chronic diastolic (congestive) heart failure: Secondary | ICD-10-CM | POA: Diagnosis not present

## 2015-02-21 LAB — BASIC METABOLIC PANEL
Anion gap: 7 (ref 5–15)
BUN: 19 mg/dL (ref 6–20)
CALCIUM: 8.8 mg/dL — AB (ref 8.9–10.3)
CO2: 26 mmol/L (ref 22–32)
Chloride: 105 mmol/L (ref 101–111)
Creatinine, Ser: 0.7 mg/dL (ref 0.44–1.00)
GFR calc Af Amer: >60 mL/min (ref >60)
GLUCOSE: 121 mg/dL — AB (ref 65–99)
Potassium: 3.9 mmol/L (ref 3.5–5.1)
SODIUM: 138 mmol/L (ref 135–145)

## 2015-02-21 MED ORDER — LORATADINE 10 MG PO TABS
10.0000 mg | ORAL_TABLET | Freq: Every day | ORAL | Status: DC
  Administered 2015-02-21 – 2015-02-23 (×3): 10 mg via ORAL
  Filled 2015-02-21 (×3): qty 1

## 2015-02-21 NOTE — Progress Notes (Signed)
[facility] Progress Note   [name]  [id number]  DOB: 12-06-1960  DOA: 02/19/2015 PCP: Bartholome Bill, MD  Brief narrative: [name] is a 55 y.o. female with grade 2 diastolic dysfunction and morbid obesity who presents with progressive shortness of breath noted on exertion. Despite increasing her daily Lasix dose, her dyspnea continued. She woke up last night with chest pressure which did not improve and she therefore presented to the hospital. She was admitted for CHF exacerbation. She states that she was taken off of spironolactone about 2 months ago.  Subjective: Chest tightness has not recurred. Has a headache and ear fullness. No fever or cough.   Assessment/Plan: Principal Problem:   Chest tightness expected to be secondary to Diastolic CHF, acute on chronic  -continue to diuresis per cardiology-in negative balance by about 4 L -Troponin 3 sets negative -Continue BiDil, Lasix, metolazone, Aldactone and potassium -Naproxen (mentioned on home medication list) should be discontinued completely  Active Problems: Frontal headache and ear fullness - possibly sinus? Allergies- tympanic membranes apear normal on exam - already on Flonase - add Claritin  Glucose intolerance  -Last A1c 6.2-A1c yesterday 5.8     HYPOKALEMIA --Replaced-continue to follow     Morbid obesity -Body mass index is 54.4 kg/(m^2).    Essential hypertension -Normotensive-continue BiDil and losartan  Hypertriglyceridemia/low HDL -Has been started on fish oil and statin   Antibiotics: Anti-infectives    None     Code Status:     Code Status Orders        Start     Ordered   02/19/15 1342  Full code   Continuous     02/19/15 1341    Code Status History    Date Active Date Inactive Code Status Order ID Comments User Context   04/11/2014 11:14 AM 04/13/2014  1:30 PM Full Code [id number]  Nita Sells, MD Inpatient     Family Communication: Disposition  Plan:  home in 1-2 days DVT prophylaxis: Lovenox  Consultants:  cardiology Procedures:     Objective: Filed Weights   02/19/15 1300 02/20/15 0531 02/21/15 0536  Weight: 139.4 kg (307 lb 5.1 oz) 140.842 kg (310 lb 8 oz) 139.254 kg (307 lb)    Intake/Output Summary (Last 24 hours) at 02/21/15 1632 Last data filed at 02/21/15 1325  Gross per 24 hour  Intake   1062 ml  Output   2902 ml  Net  -1840 ml     Vitals Filed Vitals:   02/21/15 0116 02/21/15 0536 02/21/15 0832 02/21/15 1243  BP: 107/45 125/71 129/68 123/75  Pulse: 75 76 70 87  Temp: 98.6 F (37 C) 98.4 F (36.9 C) 99 F (37.2 C) 98.3 F (36.8 C)  TempSrc: Oral Oral Oral Oral  Resp: 18 18 18 18   Height:      Weight:  139.254 kg (307 lb)    SpO2: 98% 97% 92% 96%    Exam:  General:  Pt is alert, not in acute distress  HEENT: No icterus, No thrush, oral mucosa moist  Cardiovascular: regular rate and rhythm, S1/S2 No murmur  Respiratory: clear to auscultation bilaterally   Abdomen: Soft, +Bowel sounds, non tender, non distended, no guarding  MSK: No cyanosis or clubbing- + pedal edema   Data Reviewed: Basic Metabolic Panel:  Recent Labs Lab 02/19/15 0910 02/20/15 0440 02/21/15 0444  NA 139 140 138  K 3.3* 3.5 3.9  CL 103 101 105  CO2 27 27 26   GLUCOSE  112* 124* 121*  BUN 14 18 19   CREATININE 0.56 0.87 0.70  CALCIUM 8.6* 8.8* 8.8*   Liver Function Tests:  Recent Labs Lab 02/19/15 1450  AST 24  ALT 20  ALKPHOS 77  BILITOT 0.9  PROT 7.6  ALBUMIN 3.2*   No results for input(s): LIPASE, AMYLASE in the last 168 hours. No results for input(s): AMMONIA in the last 168 hours. CBC:  Recent Labs Lab 02/19/15 0910  WBC 8.8  HGB 12.1  HCT 37.9  MCV 93.6  PLT 290   Cardiac Enzymes:  Recent Labs Lab 02/19/15 1220 02/19/15 1904 02/19/15 2342  TROPONINI <0.03 <0.03 <0.03   BNP (last 3 results)  Recent Labs  04/11/14 0703 02/19/15 0910  BNP 31.0 52.0    ProBNP (last 3  results) No results for input(s): PROBNP in the last 8760 hours.  CBG: No results for input(s): GLUCAP in the last 168 hours.  No results found for this or any previous visit (from the past 240 hour(s)).   Studies: No results found.  Scheduled Meds:  Scheduled Meds: . aspirin EC  81 mg Oral Daily  . dextromethorphan-guaiFENesin  1 tablet Oral BID  . enoxaparin (LOVENOX) injection  70 mg Subcutaneous Q24H  . furosemide  80 mg Intravenous BID  . isosorbide-hydrALAZINE  1 tablet Oral TID  . loratadine  10 mg Oral Daily  . losartan  100 mg Oral Daily  . omega-3 acid ethyl esters  1 g Oral BID  . potassium chloride SA  40 mEq Oral TID  . rosuvastatin  20 mg Oral q1800  . sodium chloride flush  3 mL Intravenous Q12H  . spironolactone  12.5 mg Oral BID   Continuous Infusions:   Time spent on care of this patient:  35  min   Cunningham, MD 02/21/2015, 4:32 PM  LOS: 2 days   [facility] Office  [phone] Pager - Text Page per [email or web address] If 7PM-7AM, please contact night-coverage [email or web address]

## 2015-02-21 NOTE — Progress Notes (Signed)
Subjective:  Breathing much better with diuresis; no chest pain  Objective:   Vital Signs : Filed Vitals:   02/21/15 0116 02/21/15 0536 02/21/15 0832 02/21/15 1243  BP: 107/45 125/71 129/68 123/75  Pulse: 75 76 70 87  Temp: 98.6 F (37 C) 98.4 F (36.9 C) 99 F (37.2 C) 98.3 F (36.8 C)  TempSrc: Oral Oral Oral Oral  Resp: _0 Height:      Weight:  307 lb (139.254 kg)    SpO2: 98% 97% 92% 96%    Intake/Output from previous day:  Intake/Output Summary (Last 24 hours) at 02/21/15 1441 Last data filed at 02/21/15 1325  Gross per 24 hour  Intake   1062 ml  Output   2902 ml  Net  -1840 ml    I/O since admission: -4090  Wt Readings from Last 3 Encounters:  02/21/15 307 lb (139.254 kg)  01/04/15 306 lb (138.801 kg)  11/29/14 313 lb 8 oz (142.203 kg)    Medications: . aspirin EC  81 mg Oral Daily  . dextromethorphan-guaiFENesin  1 tablet Oral BID  . enoxaparin (LOVENOX) injection  70 mg Subcutaneous Q24H  . furosemide  80 mg Intravenous BID  . isosorbide-hydrALAZINE  1 tablet Oral TID  . loratadine  10 mg Oral Daily  . losartan  100 mg Oral Daily  . omega-3 acid ethyl esters  1 g Oral BID  . potassium chloride SA  40 mEq Oral TID  . rosuvastatin  20 mg Oral q1800  . sodium chloride flush  3 mL Intravenous Q12H  . spironolactone  12.5 mg Oral BID       Physical Exam:   General appearance: alert, cooperative and no distress; super morbid obesity Neck: no adenopathy, no carotid bruit, no JVD, supple, symmetrical, trachea midline and thyroid not enlarged, symmetric, no tenderness/mass/nodules Lungs: no wheezing or rhochi Heart: regular rate and rhythm; 1/6 sem; no rub or diastolic murmur Abdomen: significant central adiposity; BS+; nontender Extremities: no edema, redness or tenderness in the calves or thighs Pulses: 2+ and symmetric Neurologic: Grossly normal   Rate: 70  Rhythm: normal sinus rhythm  ECG (independently read by me): NSR at 74;  Right axis deviation; nonspecific T changes  Lab Results:   Recent Labs  02/19/15 0910 02/20/15 0440 02/21/15 0444  NA 139 140 138  K 3.3* 3.5 3.9  CL 103 101 105  CO2 _1 GLUCOSE 112* 124* 121*  BUN _2 CREATININE 0.56 0.87 0.70  CALCIUM 8.6* 8.8* 8.8*    Hepatic Function Latest Ref Rng 02/19/2015 04/11/2014 02/11/2013  Total Protein 6.5 - 8.1 g/dL 7.6 6.7 9.4(H)  Albumin 3.5 - 5.0 g/dL 3.2(L) 3.0(L) 4.2  AST 15 - 41 U/L 24 44(H) 24  ALT 14 - 54 U/L 20 33 22  Alk Phosphatase 38 - 126 U/L 77 68 77  Total Bilirubin 0.3 - 1.2 mg/dL 0.9 1.7(H) 1.0  Bilirubin, Direct 0.1 - 0.5 mg/dL 0.2 - -     Recent Labs  02/19/15 0910  WBC 8.8  HGB 12.1  HCT 37.9  MCV 93.6  PLT 290     Recent Labs  02/19/15 1220 02/19/15 1904 02/19/15 2342  TROPONINI <0.03 <0.03 <0.03    Lab Results  Component Value Date   TSH 2.18 12/31/2013    Recent Labs  02/19/15 1450  HGBA1C 5.8*     Recent Labs  02/19/15 1450  PROT 7.6  ALBUMIN 3.2*  AST 24  ALT 20  ALKPHOS 77  BILITOT 0.9  BILIDIR 0.2  IBILI 0.7   No results for input(s): INR in the last 72 hours. BNP (last 3 results)  Recent Labs  04/11/14 0703 02/19/15 0910  BNP 31.0 52.0    ProBNP (last 3 results) No results for input(s): PROBNP in the last 8760 hours.   Lipid Panel     Component Value Date/Time   CHOL 169 02/20/2015 0440   TRIG 310* 02/20/2015 0440   HDL 35* 02/20/2015 0440   CHOLHDL 4.8 02/20/2015 0440   VLDL 62* 02/20/2015 0440   LDLCALC 72 02/20/2015 0440     Imaging:  No results found.    Assessment/Plan:   Principal Problem:   Diastolic CHF, acute on chronic (HCC) Active Problems:   Type 2 diabetes mellitus (HCC)   Hyperlipidemia   HYPOKALEMIA   Morbid obesity (HCC)   Essential hypertension   Abnormal EKG   Chest tightness or pressure   1. Diastolic CHF acute on chronic; excellent diuresis - 4090 since admision. 2. Morbid Obesity 3. OSA on CPAP 4.  Essential HTN 5. Hypokalemia: 3.3 to 3.5 to 3.9 today 6. Mixed hyperlipidemia  Excellent diuresis with net -4090 since admission.  Acute on chronic diastolic heart failure contributing to symptoms; no recurrent chest pain.  Prior to admission, patient had experienced PND and orthopnea for several nights. Slept much better last evening.  Tolerating increase nitrates/hydralazine and increased spironolactone.  Cr 0.7. Now on statin. No myalgias.   Troy Sine, MD, [facility] 02/21/2015, 2:41 PM

## 2015-02-22 DIAGNOSIS — R9431 Abnormal electrocardiogram [ECG] [EKG]: Secondary | ICD-10-CM | POA: Diagnosis not present

## 2015-02-22 DIAGNOSIS — I5033 Acute on chronic diastolic (congestive) heart failure: Secondary | ICD-10-CM | POA: Diagnosis not present

## 2015-02-22 DIAGNOSIS — R079 Chest pain, unspecified: Secondary | ICD-10-CM

## 2015-02-22 DIAGNOSIS — I509 Heart failure, unspecified: Secondary | ICD-10-CM | POA: Diagnosis not present

## 2015-02-22 DIAGNOSIS — R0789 Other chest pain: Secondary | ICD-10-CM | POA: Diagnosis not present

## 2015-02-22 DIAGNOSIS — I5040 Unspecified combined systolic (congestive) and diastolic (congestive) heart failure: Secondary | ICD-10-CM | POA: Diagnosis not present

## 2015-02-22 DIAGNOSIS — I10 Essential (primary) hypertension: Secondary | ICD-10-CM | POA: Diagnosis not present

## 2015-02-22 LAB — BASIC METABOLIC PANEL
Anion gap: 10 (ref 5–15)
BUN: 18 mg/dL (ref 6–20)
CHLORIDE: 104 mmol/L (ref 101–111)
CO2: 25 mmol/L (ref 22–32)
CREATININE: 0.72 mg/dL (ref 0.44–1.00)
Calcium: 9.2 mg/dL (ref 8.9–10.3)
GFR calc Af Amer: >60 mL/min (ref >60)
GFR calc non Af Amer: >60 mL/min (ref >60)
GLUCOSE: 114 mg/dL — AB (ref 65–99)
POTASSIUM: 4.5 mmol/L (ref 3.5–5.1)
Sodium: 139 mmol/L (ref 135–145)

## 2015-02-22 MED ORDER — FUROSEMIDE 40 MG PO TABS
60.0000 mg | ORAL_TABLET | Freq: Two times a day (BID) | ORAL | Status: DC
  Administered 2015-02-22 – 2015-02-23 (×2): 60 mg via ORAL
  Filled 2015-02-22 (×2): qty 1

## 2015-02-22 MED ORDER — OXYCODONE-ACETAMINOPHEN 5-325 MG PO TABS
1.0000 | ORAL_TABLET | ORAL | Status: DC | PRN
  Administered 2015-02-22 (×3): 1 via ORAL
  Administered 2015-02-23 (×2): 2 via ORAL
  Filled 2015-02-22 (×2): qty 2
  Filled 2015-02-22 (×3): qty 1

## 2015-02-22 MED ORDER — INFLUENZA VAC SPLIT QUAD 0.5 ML IM SUSY
0.5000 mL | PREFILLED_SYRINGE | INTRAMUSCULAR | Status: AC
  Administered 2015-02-23: 0.5 mL via INTRAMUSCULAR
  Filled 2015-02-22: qty 0.5

## 2015-02-22 NOTE — Progress Notes (Signed)
Patient states she takes 80 mg PO lasix at home and was requesting home dosage (now taking 60mg  BID). PA notified. Noted that aldactone is increased to BID instead of daily. PA stated will allow Cardiologist to address in the morning.

## 2015-02-22 NOTE — Progress Notes (Signed)
Subjective:  Breathing much better with diuresis; no chest pain  Objective:   Vital Signs : Filed Vitals:   02/21/15 1243 02/21/15 2014 02/21/15 2239 02/22/15 0621  BP: 123/75 123/66  123/55  Pulse: 87  89 67  Temp: 98.3 F (36.8 C) 98.3 F (36.8 C)  98.4 F (36.9 C)  TempSrc: Oral Oral  Oral  Resp: _0 Height:      Weight:    305 lb 3.2 oz (138.438 kg)  SpO2: 96% 96% 96% 95%    Intake/Output from previous day:  Intake/Output Summary (Last 24 hours) at 02/22/15 1126 Last data filed at 02/22/15 1108  Gross per 24 hour  Intake    825 ml  Output   3050 ml  Net  -2225 ml    I/O since admission: -6287  Wt Readings from Last 3 Encounters:  02/22/15 305 lb 3.2 oz (138.438 kg)  01/04/15 306 lb (138.801 kg)  11/29/14 313 lb 8 oz (142.203 kg)    Medications: . aspirin EC  81 mg Oral Daily  . dextromethorphan-guaiFENesin  1 tablet Oral BID  . enoxaparin (LOVENOX) injection  70 mg Subcutaneous Q24H  . furosemide  80 mg Intravenous BID  . [START ON 02/23/2015] Influenza vac split quadrivalent PF  0.5 mL Intramuscular Tomorrow-1000  . isosorbide-hydrALAZINE  1 tablet Oral TID  . loratadine  10 mg Oral Daily  . losartan  100 mg Oral Daily  . omega-3 acid ethyl esters  1 g Oral BID  . potassium chloride SA  40 mEq Oral TID  . rosuvastatin  20 mg Oral q1800  . sodium chloride flush  3 mL Intravenous Q12H  . spironolactone  12.5 mg Oral BID       Physical Exam:   General appearance: alert, cooperative and no distress; super morbid obesity Neck: no adenopathy, no carotid bruit, no JVD, supple, symmetrical, trachea midline and thyroid not enlarged, symmetric, no tenderness/mass/nodules Lungs: no wheezing or rhochi Heart: regular rate and rhythm; 1/6 sem; no rub or diastolic murmur Abdomen: significant central adiposity; BS+; nontender Extremities: no edema, redness or tenderness in the calves or thighs Pulses: 2+ and symmetric Neurologic: Grossly normal   Rate: 70  Rhythm: normal sinus rhythm  ECG (independently read by me): NSR at 74; Right axis deviation; nonspecific T changes  Lab Results:   Recent Labs  02/20/15 0440 02/21/15 0444 02/22/15 0420  NA 140 138 139  K 3.5 3.9 4.5  CL 101 105 104  CO2 _1 GLUCOSE 124* 121* 114*  BUN _2 CREATININE 0.87 0.70 0.72  CALCIUM 8.8* 8.8* 9.2    Hepatic Function Latest Ref Rng 02/19/2015 04/11/2014 02/11/2013  Total Protein 6.5 - 8.1 g/dL 7.6 6.7 9.4(H)  Albumin 3.5 - 5.0 g/dL 3.2(L) 3.0(L) 4.2  AST 15 - 41 U/L 24 44(H) 24  ALT 14 - 54 U/L 20 33 22  Alk Phosphatase 38 - 126 U/L 77 68 77  Total Bilirubin 0.3 - 1.2 mg/dL 0.9 1.7(H) 1.0  Bilirubin, Direct 0.1 - 0.5 mg/dL 0.2 - -    No results for input(s): WBC, NEUTROABS, HGB, HCT, MCV, PLT in the last 72 hours.   Recent Labs  02/19/15 1220 02/19/15 1904 02/19/15 2342  TROPONINI <0.03 <0.03 <0.03    Lab Results  Component Value Date   TSH 2.18 12/31/2013    Recent Labs  02/19/15 1450  HGBA1C 5.8*     Recent Labs  02/19/15 1450  PROT 7.6  ALBUMIN 3.2*  AST 24  ALT 20  ALKPHOS 77  BILITOT 0.9  BILIDIR 0.2  IBILI 0.7   No results for input(s): INR in the last 72 hours. BNP (last 3 results)  Recent Labs  04/11/14 0703 02/19/15 0910  BNP 31.0 52.0    ProBNP (last 3 results) No results for input(s): PROBNP in the last 8760 hours.   Lipid Panel     Component Value Date/Time   CHOL 169 02/20/2015 0440   TRIG 310* 02/20/2015 0440   HDL 35* 02/20/2015 0440   CHOLHDL 4.8 02/20/2015 0440   VLDL 62* 02/20/2015 0440   LDLCALC 72 02/20/2015 0440     Imaging:  No results found.    Assessment/Plan:   Principal Problem:   Diastolic CHF, acute on chronic (HCC) Active Problems:   Type 2 diabetes mellitus (HCC)   Hyperlipidemia   HYPOKALEMIA   Morbid obesity (HCC)   Essential hypertension   Abnormal EKG   Chest tightness or pressure   1. Diastolic CHF acute on chronic; excellent  diuresis - 6287 since admision. 2. Morbid Obesity 3. OSA on CPAP 4. Essential HTN 5. Hypokalemia: 3.3 to 3.5 to 3.9 -->4.5 6. Mixed hyperlipidemia  Excellent diuresis with net -6287 since admission.  Acute on chronic diastolic heart failure contributing to symptoms; no recurrent chest pain.  Prior to admission, patient had experienced PND and orthopnea for several nights. Slept much better last evening.  Tolerating increase nitrates/hydralazine and increased spironolactone.  Cr 0.72. Now on statin. No myalgias.  Would change iv furosemide to oral. She had been on 40 mg PTA but will change to 60 mg po bid today; Keep tonight and probable dc in am.   Troy Sine, MD, [facility] 02/22/2015, 11:26 AM

## 2015-02-22 NOTE — Progress Notes (Signed)
[facility]                                                                              Patient Demographics  [name], is a 55 y.o. female, DOB - 29-Oct-1960, [id number]  Admit date - 02/19/2015   Admitting Physician Venetia Maxon Rama, MD  Outpatient Primary MD for the patient is Bartholome Bill, MD  LOS - 3   Chief Complaint  Patient presents with  . Chest Pain  . Shortness of Breath       Brief HPI   [name] is a 55 y.o. female with grade 2 diastolic dysfunction and morbid obesity who presents with progressive shortness of breath noted on exertion. Despite increasing her daily Lasix dose, her dyspnea continued. She woke up last night with chest pressure which did not improve and she therefore presented to the hospital. She was admitted for CHF exacerbation. She states that she was taken off of spironolactone about 2 months ago.   Assessment & Plan   Principal Problem:  Acute on chronic diastolic CHF , chest Tightness -continue to diuresis per cardiology -Negative balance of 6.28 L, weight down from 310-> 305lbs -Troponin 3 sets negative -Continue BiDil, Lasix, metolazone, Aldactone and potassium -Naproxen (mentioned on home medication list) should be discontinued completely - 2-D echo showed EF of 60-65%, mild LVH  Active Problems: Frontal headache and ear fullness - possibly sinus? Allergies- tympanic membranes apear normal on exam -  continue Claritin, Flonase - Patient also reporting that pain worse after taking BiDil  Glucose intolerance  Hemoglobin A1c 5.8   HYPOKALEMIA --Replaced-continue to follow    Morbid obesity -Body mass index is 54.4 kg/(m^2). Patient counseled strongly on diet and weight control, regular exercise   Essential hypertension -Normotensive-continue BiDil and losartan  Hypertriglyceridemia/low HDL -Has been started on fish oil and statin    Code Status: Full code  Family Communication:  Discussed in detail with the patient, all imaging results, lab results explained to the patient  and sister at the bedside    Disposition Plan:   Time Spent in minutes   25 minutes  Procedures  echo  Consults   cards  DVT Prophylaxis  Lovenox  Medications  Scheduled Meds: . aspirin EC  81 mg Oral Daily  . dextromethorphan-guaiFENesin  1 tablet Oral BID  . enoxaparin (LOVENOX) injection  70 mg Subcutaneous Q24H  . furosemide  60 mg Oral BID  . [START ON 02/23/2015] Influenza vac split quadrivalent PF  0.5 mL Intramuscular Tomorrow-1000  . isosorbide-hydrALAZINE  1 tablet Oral TID  . loratadine  10 mg Oral Daily  . losartan  100 mg Oral Daily  . omega-3 acid ethyl esters  1 g Oral BID  . potassium chloride SA  40 mEq Oral TID  . rosuvastatin  20 mg Oral q1800  . sodium chloride flush  3 mL Intravenous Q12H  . spironolactone  12.5 mg Oral BID   Continuous Infusions:  PRN Meds:.sodium chloride, acetaminophen, albuterol, bisacodyl, fluticasone, metolazone, morphine injection, ondansetron (ZOFRAN) IV, oxyCODONE-acetaminophen, sodium chloride flush   Antibiotics   Anti-infectives    None  Subjective:   [name] was seen and examined today.Complaining of headache, no chest pain. Shortness of breath is improving. Patient denies dizziness,  abdominal pain, N/V/D/C, new weakness, numbess, tingling. No acute events overnight.    Objective:   Blood pressure 128/71, pulse 88, temperature 97.8 F (36.6 C), temperature source Oral, resp. rate 20, height 5\' 3"  (1.6 m), weight 138.438 kg (305 lb 3.2 oz), SpO2 97 %.  Wt Readings from Last 3 Encounters:  02/22/15 138.438 kg (305 lb 3.2 oz)  01/04/15 138.801 kg (306 lb)  11/29/14 142.203 kg (313 lb 8 oz)     Intake/Output Summary (Last 24 hours) at 02/22/15 1343 Last data filed at 02/22/15 1108  Gross per 24 hour  Intake    603 ml  Output   2800 ml  Net  -2197 ml    Exam  General: Alert and oriented x 3,  NAD  HEENT:  PERRLA, EOMI, Anicteric Sclera, mucous membranes moist.   Neck: Supple, no JVD, no masses  CVS: S1 S2 auscultated, no rubs, murmurs or gallops. Regular rate and rhythm.  Respiratory: Clear to auscultation bilaterally, no wheezing, rales or rhonchi  Abdomen:  morbidly obese, Soft, nontender, nondistended, + bowel sounds  Ext: no cyanosis clubbing, 2 + edema  Neuro: AAOx3, Cr N's II- XII. Strength 5/5 upper and lower extremities bilaterally  Skin: No rashes  Psych: Normal affect and demeanor, alert and oriented x3    Data Review   Micro Results No results found for this or any previous visit (from the past 240 hour(s)).  Radiology Reports Dg Chest 2 View  02/19/2015  CLINICAL DATA:  Midchest pain for 5 days. EXAM: CHEST  2 VIEW COMPARISON:  04/11/2014. FINDINGS: The cardio pericardial silhouette is enlarged. There is pulmonary vascular congestion without overt pulmonary edema. No overt airspace pulmonary edema or focal lung consolidation. No pleural effusion. The visualized bony structures of the thorax are intact. Telemetry leads overlie the chest. IMPRESSION: Stable.  Cardiomegaly with vascular congestion. Electronically Signed   By: Misty Stanley M.D.   On: 02/19/2015 09:59    CBC  Recent Labs Lab 02/19/15 0910  WBC 8.8  HGB 12.1  HCT 37.9  PLT 290  MCV 93.6  MCH 29.9  MCHC 31.9  RDW 14.8    Chemistries   Recent Labs Lab 02/19/15 0910 02/19/15 1450 02/20/15 0440 02/21/15 0444 02/22/15 0420  NA 139  --  140 138 139  K 3.3*  --  3.5 3.9 4.5  CL 103  --  101 105 104  CO2 27  --  27 26 25   GLUCOSE 112*  --  124* 121* 114*  BUN 14  --  18 19 18   CREATININE 0.56  --  0.87 0.70 0.72  CALCIUM 8.6*  --  8.8* 8.8* 9.2  AST  --  24  --   --   --   ALT  --  20  --   --   --   ALKPHOS  --  77  --   --   --   BILITOT  --  0.9  --   --   --     ------------------------------------------------------------------------------------------------------------------ estimated creatinine clearance is 107.6 mL/min (by C-G formula based on Cr of 0.72). ------------------------------------------------------------------------------------------------------------------  Recent Labs  02/19/15 1450  HGBA1C 5.8*   ------------------------------------------------------------------------------------------------------------------  Recent Labs  02/20/15 0440  CHOL 169  HDL 35*  LDLCALC 72  TRIG 310*  CHOLHDL 4.8   ------------------------------------------------------------------------------------------------------------------ No results for  input(s): TSH, T4TOTAL, T3FREE, THYROIDAB in the last 72 hours.  Invalid input(s): FREET3 ------------------------------------------------------------------------------------------------------------------ No results for input(s): VITAMINB12, FOLATE, FERRITIN, TIBC, IRON, RETICCTPCT in the last 72 hours.  Coagulation profile No results for input(s): INR, PROTIME in the last 168 hours.  No results for input(s): DDIMER in the last 72 hours.  Cardiac Enzymes  Recent Labs Lab 02/19/15 1220 02/19/15 1904 02/19/15 2342  TROPONINI <0.03 <0.03 <0.03   ------------------------------------------------------------------------------------------------------------------ Invalid input(s): POCBNP  No results for input(s): GLUCAP in the last 72 hours.   Colby Catanese M.D. [facility] 02/22/2015, 1:43 PM  Pager: [phone] Between 7am to 7pm - call Pager - [phone]  After 7pm go to [email or web address] - password [email or web address]  Call night coverage person covering after 7pm

## 2015-02-23 ENCOUNTER — Other Ambulatory Visit: Payer: Self-pay | Admitting: Cardiology

## 2015-02-23 DIAGNOSIS — E876 Hypokalemia: Secondary | ICD-10-CM

## 2015-02-23 DIAGNOSIS — I5040 Unspecified combined systolic (congestive) and diastolic (congestive) heart failure: Secondary | ICD-10-CM | POA: Diagnosis not present

## 2015-02-23 DIAGNOSIS — E785 Hyperlipidemia, unspecified: Secondary | ICD-10-CM | POA: Diagnosis not present

## 2015-02-23 DIAGNOSIS — R0789 Other chest pain: Secondary | ICD-10-CM | POA: Diagnosis not present

## 2015-02-23 DIAGNOSIS — I10 Essential (primary) hypertension: Secondary | ICD-10-CM | POA: Diagnosis not present

## 2015-02-23 DIAGNOSIS — R9431 Abnormal electrocardiogram [ECG] [EKG]: Secondary | ICD-10-CM | POA: Diagnosis not present

## 2015-02-23 DIAGNOSIS — I5033 Acute on chronic diastolic (congestive) heart failure: Secondary | ICD-10-CM | POA: Diagnosis not present

## 2015-02-23 MED ORDER — POTASSIUM CHLORIDE CRYS ER 20 MEQ PO TBCR: 40.0000 meq | EXTENDED_RELEASE_TABLET | Freq: Three times a day (TID) | ORAL | Status: DC

## 2015-02-23 MED ORDER — FLUTICASONE PROPIONATE 50 MCG/ACT NA SUSP: 2.0000 | Freq: Two times a day (BID) | NASAL | Status: DC | PRN

## 2015-02-23 MED ORDER — LORATADINE 10 MG PO TABS: 10.0000 mg | ORAL_TABLET | Freq: Every day | ORAL | Status: DC

## 2015-02-23 MED ORDER — OXYCODONE-ACETAMINOPHEN 5-325 MG PO TABS: 1.0000 | ORAL_TABLET | ORAL | Status: DC | PRN

## 2015-02-23 MED ORDER — ASPIRIN 81 MG PO TBEC: 81.0000 mg | DELAYED_RELEASE_TABLET | Freq: Every day | ORAL | Status: DC

## 2015-02-23 MED ORDER — ROSUVASTATIN CALCIUM 20 MG PO TABS: 20.0000 mg | ORAL_TABLET | Freq: Every day | ORAL | Status: DC

## 2015-02-23 MED ORDER — FUROSEMIDE 40 MG PO TABS: 80.0000 mg | ORAL_TABLET | Freq: Two times a day (BID) | ORAL | Status: DC

## 2015-02-23 MED ORDER — ISOSORB DINITRATE-HYDRALAZINE 20-37.5 MG PO TABS: 1.0000 | ORAL_TABLET | Freq: Three times a day (TID) | ORAL | Status: DC

## 2015-02-23 MED ORDER — OMEGA-3-ACID ETHYL ESTERS 1 G PO CAPS: 1.0000 g | ORAL_CAPSULE | Freq: Two times a day (BID) | ORAL | Status: DC

## 2015-02-23 MED ORDER — SPIRONOLACTONE 25 MG PO TABS: 12.5000 mg | ORAL_TABLET | Freq: Two times a day (BID) | ORAL | Status: DC

## 2015-02-23 NOTE — Progress Notes (Signed)
Pt placed on Cpap 12 CMH20.  Tolerating well. No issues to report. Rt to monitor.

## 2015-02-23 NOTE — Progress Notes (Addendum)
Patient Name: [name] Date of Encounter: 02/23/2015   SUBJECTIVE  Feeling well. No chest pain, sob or palpitations.   CURRENT MEDS . aspirin EC  81 mg Oral Daily  . dextromethorphan-guaiFENesin  1 tablet Oral BID  . enoxaparin (LOVENOX) injection  70 mg Subcutaneous Q24H  . furosemide  60 mg Oral BID  . isosorbide-hydrALAZINE  1 tablet Oral TID  . loratadine  10 mg Oral Daily  . losartan  100 mg Oral Daily  . omega-3 acid ethyl esters  1 g Oral BID  . potassium chloride SA  40 mEq Oral TID  . rosuvastatin  20 mg Oral q1800  . sodium chloride flush  3 mL Intravenous Q12H  . spironolactone  12.5 mg Oral BID    OBJECTIVE  Filed Vitals:   02/22/15 1214 02/22/15 2052 02/23/15 0029 02/23/15 0627  BP: 128/71 122/74 123/65 127/65  Pulse: 88 103 99 73  Temp: 97.8 F (36.6 C) 98.6 F (37 C)  98.5 F (36.9 C)  TempSrc: Oral Oral  Oral  Resp: 20 18 22 18   Height:      Weight:    304 lb (137.893 kg)  SpO2: 97% 98% 96% 98%    Intake/Output Summary (Last 24 hours) at 02/23/15 1103 Last data filed at 02/23/15 1024  Gross per 24 hour  Intake   1310 ml  Output   3450 ml  Net  -2140 ml   Filed Weights   02/21/15 0536 02/22/15 0621 02/23/15 0627  Weight: 307 lb (139.254 kg) 305 lb 3.2 oz (138.438 kg) 304 lb (137.893 kg)    PHYSICAL EXAM  General: Pleasant, NAD. Neuro: Alert and oriented X 3. Moves all extremities spontaneously. Psych: Normal affect. HEENT:  Normal  Neck: Supple without bruits or JVD. Lungs:  Resp regular and unlabored, CTA. Heart: RRR no s3, s4, or murmurs. Abdomen: Soft, non-tender, non-distended, BS + x 4.  Extremities: No clubbing, cyanosis or edema. DP/PT/Radials 2+ and equal bilaterally.  Accessory Clinical Findings  CBC No results for input(s): WBC, NEUTROABS, HGB, HCT, MCV, PLT in the last 72 hours. Basic Metabolic Panel  Recent Labs  02/21/15 0444 02/22/15 0420  NA 138 139  K 3.9 4.5  CL 105 104  CO2 26 25  GLUCOSE 121* 114*    BUN 19 18  CREATININE 0.70 0.72  CALCIUM 8.8* 9.2    TELE  Sinus rhythm   Radiology/Studies  Dg Chest 2 View  02/19/2015  CLINICAL DATA:  Midchest pain for 5 days. EXAM: CHEST  2 VIEW COMPARISON:  04/11/2014. FINDINGS: The cardio pericardial silhouette is enlarged. There is pulmonary vascular congestion without overt pulmonary edema. No overt airspace pulmonary edema or focal lung consolidation. No pleural effusion. The visualized bony structures of the thorax are intact. Telemetry leads overlie the chest. IMPRESSION: Stable.  Cardiomegaly with vascular congestion. Electronically Signed   By: Misty Stanley M.D.   On: 02/19/2015 09:59    ASSESSMENT AND PLAN Principal Problem:   Diastolic CHF, acute on chronic (HCC) Active Problems:   Type 2 diabetes mellitus (HCC)   Hyperlipidemia   HYPOKALEMIA   Morbid obesity (HCC)   Essential hypertension   Abnormal EKG   Chest tightness or pressure   Acute on chronic congestive heart failure (HCC)   Pain in the chest    Plan: diuresed negative 7.7L with 3lb weight loss. Initially diuresed with IV lasix 80mg  BID then changed to po lasix 60mg  BID yesterday. She was taking lasix po  80mg  BID at home. Prior to admission, patient had experienced PND and orthopnea for several nights. Creatinine and BP are stable. Tolerating increase nitrates/hydralazine and increased spironolactone.   MD to decide discharge dose of lasix. Likely need 80mg  BID. F/u has been scheduled with Dr. Percival Spanish.   Jarrett Soho PA-C Pager [phone]   Patient seen and examined. Agree with assessment and plan. I/O -[id number].  Weight reduced from 310 to 304.   Breathing well. According to pharmacy she was on lasix 80 mg bid at home dose.   OK to dc today with lasix 80 mg bid oral dose, increased nitrates/hydralazine and spironolactone. F/U in office with Dr. Percival Spanish.    Troy Sine, MD, [facility] 02/23/2015 11:13 AM

## 2015-02-23 NOTE — Evaluation (Signed)
Physical Therapy Evaluation Patient Details Name: [name] MRN: [id number] DOB: April 27, 1960 Today's Date: 02/23/2015   History of Present Illness  Patient is a 55 y/o female with hx of HTN, HLD, OSA, CAD, CHF presents with SOB on exertion and chest pressure. Admitted for CHF exacerbation. CXR shows: Vascular congestion.  Clinical Impression  Patient presents with dyspnea on exertion, decreased endurance, activity tolerance and chronic left knee pain impacting mobility. Tolerated ambulation with supervision progressing to Mod I. Requires rest breaks due to SOB. Education re: getting on home exercise program and performing short bouts of activity at home with longer rest breaks to improve tolerance. Discussed pulmonary rehab to improve endurance and activity tolerance. Pt does not require further skilled therapy services as pt plans to d/c home today. Pt functioning close to baseline and safe to d/c home with family. Discharge from therapy.    Follow Up Recommendations Outpatient PT (pulmonary rehab)    Equipment Recommendations  None recommended by PT    Recommendations for Other Services       Precautions / Restrictions Precautions Precautions: None Restrictions Weight Bearing Restrictions: No      Mobility  Bed Mobility               General bed mobility comments: Sitting EOB upon PT arrival.   Transfers Overall transfer level: Needs assistance Equipment used: None Transfers: Sit to/from Stand Sit to Stand: Modified independent (Device/Increase time)         General transfer comment: Able to stand without assist. Stood from EOB x1, from toilet x1.   Ambulation/Gait Ambulation/Gait assistance: Supervision;Modified independent (Device/Increase time) Ambulation Distance (Feet): 150 Feet Assistive device: None Gait Pattern/deviations: Step-through pattern;Decreased stride length;Wide base of support Gait velocity: decreased   General Gait Details: Slow, steady,  "waddling like" gait pattern due to wide BoS. 3/4 DOE - reports as premorbid. 2 standing rest breaks. HR up to 139 bpm. Resolved quickly with rest break.  Stairs            Wheelchair Mobility    Modified Rankin (Stroke Patients Only)       Balance Overall balance assessment: Needs assistance Sitting-balance support: Feet supported;No upper extremity supported Sitting balance-Leahy Scale: Normal     Standing balance support: During functional activity Standing balance-Leahy Scale: Good                               Pertinent Vitals/Pain Pain Assessment: Faces Faces Pain Scale: Hurts little more Pain Location: left knee- chronic Pain Descriptors / Indicators: Sore Pain Intervention(s): Monitored during session;Repositioned    Home Living Family/patient expects to be discharged to:: Private residence Living Arrangements: Children Available Help at Discharge: Family;Available PRN/intermittently Type of Home: House Home Access: Level entry     Home Layout: One level Home Equipment: None      Prior Function Level of Independence: Independent         Comments: Works as Chief Technology Officer. Drives.      Hand Dominance        Extremity/Trunk Assessment   Upper Extremity Assessment: Defer to OT evaluation           Lower Extremity Assessment: Overall WFL for tasks assessed      Cervical / Trunk Assessment: Normal  Communication   Communication: No difficulties  Cognition Arousal/Alertness: Awake/alert Behavior During Therapy: WFL for tasks assessed/performed Overall Cognitive Status: Within Functional Limits for tasks assessed  General Comments General comments (skin integrity, edema, etc.): Daughter present in room. Discussed exercise program, activity modification. safety techniques at home, energy conservation techniques, pulmonary rehab etc.     Exercises        Assessment/Plan    PT  Assessment All further PT needs can be met in the next venue of care  PT Diagnosis Difficulty walking   PT Problem List    PT Treatment Interventions     PT Goals (Current goals can be found in the Care Plan section) Acute Rehab PT Goals Patient Stated Goal: to be able to walk more PT Goal Formulation: All assessment and education complete, DC therapy    Frequency     Barriers to discharge        Co-evaluation               End of Session   Activity Tolerance: Patient tolerated treatment well Patient left: in bed;with call bell/phone within reach;with family/visitor present Nurse Communication: Mobility status    Functional Assessment Tool Used: clinical judgment Functional Limitation: Mobility: Walking and moving around Mobility: Walking and Moving Around Current Status (P1225): At least 1 percent but less than 20 percent impaired, limited or restricted Mobility: Walking and Moving Around Goal Status [phone]): At least 1 percent but less than 20 percent impaired, limited or restricted Mobility: Walking and Moving Around Discharge Status [phone]): At least 1 percent but less than 20 percent impaired, limited or restricted    Time: 1145-1201 PT Time Calculation (min) (ACUTE ONLY): 16 min   Charges:   PT Evaluation $PT Eval Moderate Complexity: 1 Procedure     PT G Codes:   PT G-Codes **NOT FOR INPATIENT CLASS** Functional Assessment Tool Used: clinical judgment Functional Limitation: Mobility: Walking and moving around Mobility: Walking and Moving Around Current Status (G5271): At least 1 percent but less than 20 percent impaired, limited or restricted Mobility: Walking and Moving Around Goal Status [phone]): At least 1 percent but less than 20 percent impaired, limited or restricted Mobility: Walking and Moving Around Discharge Status [phone]): At least 1 percent but less than 20 percent impaired, limited or restricted    [place] 02/23/2015, 12:12 PM Wray Kearns, [place], DPT [phone]

## 2015-02-23 NOTE — Progress Notes (Signed)
Orders received for pt discharge.  Discharge summary printed and reviewed with pt.  Explained medication regimen, and pt had no further questions at this time.  IV removed and site remains clean, dry, intact.  Telemetry removed.  Pt in stable condition and awaiting transport. 

## 2015-02-23 NOTE — Discharge Summary (Signed)
Physician Discharge Summary   Patient ID: [name] MRN: [id number] DOB/AGE: Mar 18, 1960 55 y.o.  Admit date: 02/19/2015 Discharge date: 02/23/2015  Primary Care Physician:  Bartholome Bill, MD  Discharge Diagnoses:    . Diastolic CHF, acute on chronic ([place]) . Abnormal EKG . Chest tightness or pressure . Essential hypertension . Hyperlipidemia . HYPOKALEMIA . Morbid obesity ([place])  Consults: Cardiology   Recommendations for Outpatient Follow-up:  1. Outpatient PT 2. Please repeat BMET at next visit   DIET: Heart healthy diet    Allergies:   Allergies  Allergen Reactions  . Doxycycline Nausea And Vomiting  . Latex Hives  . Ciprofloxacin Rash  . Hydrocortisone Rash  . Neomycin Rash  . Sulfonamide Derivatives Swelling and Rash     DISCHARGE MEDICATIONS: Current Discharge Medication List    START taking these medications   Details  aspirin EC 81 MG EC tablet Take 1 tablet (81 mg total) by mouth daily. Qty: 30 tablet, Refills: 3    fluticasone (FLONASE) 50 MCG/ACT nasal spray Place 2 sprays into both nostrils 2 (two) times daily as needed for allergies or rhinitis. Qty: 16 g, Refills: 2    isosorbide-hydrALAZINE (BIDIL) 20-37.5 MG tablet Take 1 tablet by mouth 3 (three) times daily. Qty: 90 tablet, Refills: 3    loratadine (CLARITIN) 10 MG tablet Take 1 tablet (10 mg total) by mouth daily. Qty: 30 tablet, Refills: 3    omega-3 acid ethyl esters (LOVAZA) 1 g capsule Take 1 capsule (1 g total) by mouth 2 (two) times daily. Qty: 60 capsule, Refills: 3    oxyCODONE-acetaminophen (PERCOCET/ROXICET) 5-325 MG tablet Take 1 tablet by mouth every 4 (four) hours as needed for moderate pain or severe pain. Qty: 30 tablet, Refills: 0    rosuvastatin (CRESTOR) 20 MG tablet Take 1 tablet (20 mg total) by mouth at bedtime. Qty: 30 tablet, Refills: 3      CONTINUE these medications which have CHANGED   Details  furosemide (LASIX) 40 MG tablet Take 2 tablets  (80 mg total) by mouth 2 (two) times daily. For swelling or fluid retention. Qty: 360 tablet, Refills: 3   Associated Diagnoses: Essential hypertension; Chronic diastolic heart failure (HCC)    potassium chloride SA (KLOR-CON M20) 20 MEQ tablet Take 2 tablets (40 mEq total) by mouth 3 (three) times daily. Qty: 180 tablet, Refills: 3    spironolactone (ALDACTONE) 25 MG tablet Take 0.5 tablets (12.5 mg total) by mouth 2 (two) times daily. Qty: 60 tablet, Refills: 3      CONTINUE these medications which have NOT CHANGED   Details  albuterol (PROVENTIL HFA;VENTOLIN HFA) 108 (90 BASE) MCG/ACT inhaler Inhale 1-2 puffs into the lungs every 6 (six) hours as needed for wheezing or shortness of breath. Qty: 1 Inhaler, Refills: 0    bisacodyl (DULCOLAX) 5 MG EC tablet Take 15 mg by mouth daily as needed for moderate constipation.    dextromethorphan-guaiFENesin (MUCINEX DM) 30-600 MG per 12 hr tablet Take 1 tablet by mouth 2 (two) times daily.    losartan (COZAAR) 100 MG tablet Take 1 tablet (100 mg total) by mouth daily. Qty: 90 tablet, Refills: 1    metolazone (ZAROXOLYN) 2.5 MG tablet Take 1 tablet (2.5 mg total) by mouth daily as needed. see notes Qty: 90 tablet, Refills: 1      STOP taking these medications     naproxen sodium (ANAPROX) 220 MG tablet      amoxicillin-clavulanate (AUGMENTIN) 875-125 MG tablet  Brief H and P: For complete details please refer to admission H and P, but in brief [name] is a 55 y.o. female with grade 2 diastolic dysfunction and morbid obesity who presents with progressive shortness of breath noted on exertion. Despite increasing her daily Lasix dose, her dyspnea continued. She woke up last night with chest pressure which did not improve and she therefore presented to the hospital. She was admitted for CHF exacerbation. She states that she was taken off of spironolactone about 2 months ago.  Hospital Course:  Acute on chronic diastolic  CHF , chest Tightness - Cardiology was consulted and patient was placed on aggressive IV diuresis with Lasix. -Negative balance of 7.78  L, weight down from 310-> 304lbs -Troponin 3 sets negative -Continue BiDil, Lasix, metolazone, Aldactone and potassium. Spironolactone was increased to 12.5 mg twice a day -Naproxen (mentioned on home medication list) should be discontinued completely - 2-D echo showed EF of 60-65%, mild LVH  Frontal headache and ear fullness improved - possibly sinus? Allergies- tympanic membranes apear normal on exam - continue Claritin, Flonase - Patient also reporting that pain worse after taking BiDil  Glucose intolerance  Hemoglobin A1c 5.8   HYPOKALEMIA --Replaced-continue to follow    Morbid obesity -Body mass index is 54.4 kg/(m^2). Patient counseled strongly on diet and weight control, regular exercise   Essential hypertension -Normotensive-continue BiDil and losartan  Hypertriglyceridemia/low HDL -Has been started on fish oil and statin   Day of Discharge BP 117/55 mmHg  Pulse 91  Temp(Src) 98 F (36.7 C) (Oral)  Resp 18  Ht 5\' 3"  (1.6 m)  Wt 137.893 kg (304 lb)  BMI 53.86 kg/m2  SpO2 97%  Physical Exam: General: Alert and awake oriented x3 not in any acute distress. HEENT: anicteric sclera, pupils reactive to light and accommodation CVS: S1-S2 clear no murmur rubs or gallops Chest: clear to auscultation bilaterally, no wheezing rales or rhonchi Abdomen: Morbid obesity, soft nontender, nondistended, normal bowel sounds Extremities: no cyanosis, clubbing or edema noted bilaterally Neuro: Cranial nerves II-XII intact, no focal neurological deficits   The results of significant diagnostics from this hospitalization (including imaging, microbiology, ancillary and laboratory) are listed below for reference.    LAB RESULTS: Basic Metabolic Panel:  Recent Labs Lab 02/21/15 0444 02/22/15 0420  NA 138 139  K 3.9 4.5  CL 105 104   CO2 26 25  GLUCOSE 121* 114*  BUN 19 18  CREATININE 0.70 0.72  CALCIUM 8.8* 9.2   Liver Function Tests:  Recent Labs Lab 02/19/15 1450  AST 24  ALT 20  ALKPHOS 77  BILITOT 0.9  PROT 7.6  ALBUMIN 3.2*   No results for input(s): LIPASE, AMYLASE in the last 168 hours. No results for input(s): AMMONIA in the last 168 hours. CBC:  Recent Labs Lab 02/19/15 0910  WBC 8.8  HGB 12.1  HCT 37.9  MCV 93.6  PLT 290   Cardiac Enzymes:  Recent Labs Lab 02/19/15 1904 02/19/15 2342  TROPONINI <0.03 <0.03   BNP: Invalid input(s): POCBNP CBG: No results for input(s): GLUCAP in the last 168 hours.  Significant Diagnostic Studies:  Dg Chest 2 View  02/19/2015  CLINICAL DATA:  Midchest pain for 5 days. EXAM: CHEST  2 VIEW COMPARISON:  04/11/2014. FINDINGS: The cardio pericardial silhouette is enlarged. There is pulmonary vascular congestion without overt pulmonary edema. No overt airspace pulmonary edema or focal lung consolidation. No pleural effusion. The visualized bony structures of the thorax are intact.  Telemetry leads overlie the chest. IMPRESSION: Stable.  Cardiomegaly with vascular congestion. Electronically Signed   By: Misty Stanley M.D.   On: 02/19/2015 09:59    2D ECHO: Study Conclusions  - Left ventricle: The cavity size was normal. Wall thickness was increased in a pattern of mild LVH. Systolic function was normal. The estimated ejection fraction was in the range of 60% to 65%.  Disposition and Follow-up: Discharge Instructions    (HEART FAILURE PATIENTS) Call MD:  Anytime you have any of the following symptoms: 1) 3 pound weight gain in 24 hours or 5 pounds in 1 week 2) shortness of breath, with or without a dry hacking cough 3) swelling in the hands, feet or stomach 4) if you have to sleep on extra pillows at night in order to breathe.    Complete by:  As directed      Diet - low sodium heart healthy    Complete by:  As directed      Increase activity  slowly    Complete by:  As directed             DISPOSITION: Home   DISCHARGE FOLLOW-UP Follow-up Information    Follow up with Bartholome Bill, MD. Schedule an appointment as soon as possible for a visit in 2 weeks.   Specialty:  Family Medicine   Why:  for hospital follow-up   Contact information:   [address] [place] [address] [phone]       Follow up with Minus Breeding, MD On 03/10/2015.   Specialty:  Cardiology   Why:  @4 :30pm for post hospital   Contact information:   [place] [address] [phone]        Time spent on Discharge: 35 minutes  Signed:   RAI,RIPUDEEP M.D. [facility] 02/23/2015, 1:14 PM Pager: [phone]

## 2015-02-24 NOTE — Telephone Encounter (Signed)
Rx request sent to pharmacy.  

## 2015-03-01 ENCOUNTER — Ambulatory Visit: Payer: No Typology Code available for payment source | Admitting: Internal Medicine

## 2015-03-10 ENCOUNTER — Ambulatory Visit (INDEPENDENT_AMBULATORY_CARE_PROVIDER_SITE_OTHER): Payer: No Typology Code available for payment source | Admitting: Cardiology

## 2015-03-10 ENCOUNTER — Encounter: Payer: Self-pay | Admitting: Cardiology

## 2015-03-10 VITALS — BP 102/62 | HR 92 | Ht 63.0 in | Wt 296.2 lb

## 2015-03-10 DIAGNOSIS — I10 Essential (primary) hypertension: Secondary | ICD-10-CM

## 2015-03-10 MED ORDER — ADULT BLOOD PRESSURE CUFF LG KIT: 1.0000 | PACK | Freq: Every day | Status: DC

## 2015-03-10 NOTE — Progress Notes (Signed)
HPI  Pt presents for follow up of follow-up of acute on chronic diastolic HF.  The patient was hospitalized earlier this month with acute on chronic heart failure. I reviewed these hospital records. He was hypertensive. She was sent home on BiDil. She did have a headache with this. However, this has now improved. She finally got a scale and is weighing herself. She says she is avoiding salt. It seems like her weight at home is about 292 pounds for her "dry" weight. She's not having any new chest pressure, neck or arm discomfort. She's not having any new palpitations, presyncope or syncope. She has no new PND or orthopnea.scomfort. She doesn't any palpitations, presyncope or syncope. He's had no PND or orthopnea.  Allergies  Allergen Reactions  . Doxycycline Nausea And Vomiting  . Latex Hives  . Ciprofloxacin Rash  . Hydrocortisone Rash  . Neomycin Rash  . Sulfonamide Derivatives Swelling and Rash    Current Outpatient Prescriptions  Medication Sig Dispense Refill  . albuterol (PROVENTIL HFA;VENTOLIN HFA) 108 (90 BASE) MCG/ACT inhaler Inhale 1-2 puffs into the lungs every 6 (six) hours as needed for wheezing or shortness of breath. 1 Inhaler 0  . aspirin EC 81 MG EC tablet Take 1 tablet (81 mg total) by mouth daily. 30 tablet 3  . bisacodyl (DULCOLAX) 5 MG EC tablet Take 15 mg by mouth daily as needed for moderate constipation.    . fluticasone (FLONASE) 50 MCG/ACT nasal spray Place 2 sprays into both nostrils 2 (two) times daily as needed for allergies or rhinitis. 16 g 2  . furosemide (LASIX) 40 MG tablet Take 2 tablets (80 mg total) by mouth 2 (two) times daily. For swelling or fluid retention. 360 tablet 3  . isosorbide-hydrALAZINE (BIDIL) 20-37.5 MG tablet Take 1 tablet by mouth 3 (three) times daily. 90 tablet 3  . losartan (COZAAR) 100 MG tablet TAKE 1 TABLET (100 MG TOTAL) BY MOUTH DAILY. 90 tablet 0  . metolazone (ZAROXOLYN) 2.5 MG tablet Take 1 tablet (2.5 mg total) by mouth  daily as needed. see notes 90 tablet 1  . omega-3 acid ethyl esters (LOVAZA) 1 g capsule Take 1 capsule (1 g total) by mouth 2 (two) times daily. 60 capsule 3  . potassium chloride SA (KLOR-CON M20) 20 MEQ tablet Take 2 tablets (40 mEq total) by mouth 3 (three) times daily. 180 tablet 3  . rosuvastatin (CRESTOR) 20 MG tablet Take 1 tablet (20 mg total) by mouth at bedtime. 30 tablet 3  . spironolactone (ALDACTONE) 25 MG tablet Take 0.5 tablets (12.5 mg total) by mouth 2 (two) times daily. 60 tablet 3   No current facility-administered medications for this visit.    Past Medical History  Diagnosis Date  . Common migraine   . Morbid obesity ([place])   . Hyperlipidemia   . OSA (obstructive sleep apnea)     CPAP dependent  . Carpal tunnel syndrome   . Hypertension   . Allergic rhinitis   . CAD (coronary artery disease)     LHC (11/29/1999):  EF 60%; no significant CAD.  [name] Chronic diastolic CHF (congestive heart failure) ([place])     a. Echo (01/21/11):  Vigorous LVF, EF 65-70%, no RWMA, Gr 2 DD. ;  b.  Echo (12/15): Moderate LVH, EF 123456, grade 2 diastolic dysfunction, mild LAE, normal RV function  . Diverticulitis   . Acute on chronic diastolic CHF (congestive heart failure) ([place]) 04/11/2014  . Sinusitis, chronic 03/10/2012  CT sinuses 02/2012:  Chronic sinusitis with small A/F levels >> rx by ENT with prolonged augmentin F/u ct sinuses 04/2012:  Persistent sinus thickening >> rx with levaquin and clinda for 30 days.  Told to return for sinus scan in 6 weeks >> no showed for followup visit.  CT sinuses 11/2013:  Acute and chronic sinusitis noted.    [name] PNEUMONIA, ORGAN UNSPECIFIED 03/01/2009    Qualifier: Diagnosis of  By: [name] CMA, [name]    . Acute suppurative otitis media without spontaneous rupture of eardrum 02/04/2007    Centricity Description: OTITIS MEDIA, SUPPURATIVE, ACUTE, BILATERAL Qualifier: Diagnosis of  By: [name] MD, [name]  Centricity Description: OTITIS MEDIA, SUPPURATIVE,  ACUTE Qualifier: Diagnosis of  By: [name] MD, [name]   . Carpal tunnel syndrome 06/09/2006    Qualifier: Diagnosis of  By: Marca Ancona RMA, Lucy    . IBS (irritable bowel syndrome)   . Diastolic heart failure ([place])   . Hepatic steatosis   . Hiatal hernia   . Hepatomegaly   . Internal hemorrhoids     Past Surgical History  Procedure Laterality Date  . Tubal ligation    . Tonsillectomy    . Uvulopalatopharyngoplasty    . Cesarean section    . Nasal sinus surgery    . Partial hysterectomy      ROS:  As stated in the HPI and negative for all other systems.  PHYSICAL EXAM BP 102/62 mmHg  Pulse 92  Ht 5\' 3"  (1.6 m)  Wt 296 lb 4 oz (134.378 kg)  BMI 52.49 kg/m2 GENERAL:  Well appearing NECK:  No jugular venous distention, waveform within normal limits, carotid upstroke brisk and symmetric, no bruits, no thyromegaly LUNGS:  Clear to auscultation bilaterally BACK:  No CVA tenderness CHEST:  Unremarkable HEART:  PMI not displaced or sustained,S1 and S2 within normal limits, no S3, no S4, no clicks, no rubs, no murmurs ABD:  Obese, positive bowel sounds normal in frequency in pitch, no bruits, no rebound, no guarding, no midline pulsatile mass, no hepatomegaly, no splenomegaly EXT:  2 plus pulses throughout, no edema, no cyanosis no clubbing    ASSESSMENT AND PLAN   Chronic diastolic heart failure I reviewed her hospital records. I reviewed with her daily weights and when necessary dosing of her diuretic. At this point no change in her therapy as otherwise indicated. Dry weight is around 292 pounds.  Essential hypertension  Her blood pressure is currently controlled. I gave her prescription for blood pressure cuff.  Obstructive sleep apnea I instructed her to follow-up with pulmonary who has been managing this.  Morbid Obesity She understands the need to lose weight with diet and exercise.

## 2015-03-10 NOTE — Patient Instructions (Signed)
Medication Instructions:   TAKE 40 MG OF FUROSEMIDE IF WEIGHT IS UP 3 LBS IN ONE DAY  Follow-Up:  Your physician recommends that you schedule a follow-up appointment in: [facility]

## 2015-03-13 ENCOUNTER — Telehealth: Payer: Self-pay | Admitting: Cardiology

## 2015-03-13 DIAGNOSIS — I10 Essential (primary) hypertension: Secondary | ICD-10-CM

## 2015-03-13 MED ORDER — ADULT BLOOD PRESSURE CUFF LG KIT: 1.0000 | PACK | Freq: Every day | Status: DC

## 2015-03-13 NOTE — Telephone Encounter (Signed)
Spoke with patient and informed her that a printed prescription for the BP cuff can be picked up or mailed to her. She prefers it to be mailed. Will be given to MD tomorrow 2/21 to have signed.

## 2015-03-13 NOTE — Telephone Encounter (Signed)
Pt called in stating that a prescription for a Blood pressure cuff was called in to a pharmacy and she was told by the pharmacy that they do not take care of that. It needed to be sent to a medical supply company. Please f/u with the pt  Thanks

## 2015-03-25 ENCOUNTER — Telehealth: Payer: Self-pay | Admitting: Physician Assistant

## 2015-03-25 NOTE — Telephone Encounter (Signed)
The patient called to report her weight went up from 298 to 300 #.  She took metolazone 2.5 yesterday and this morning her weight was 301.   She also is feeling tired and dizzy with position change.  I told her no metolazone tomorrow and to call me back in the morning.  Tarri Fuller  PAC

## 2015-03-26 ENCOUNTER — Telehealth: Payer: Self-pay | Admitting: Physician Assistant

## 2015-03-26 NOTE — Telephone Encounter (Signed)
I spoke to [name] today. Her weight is down to 296 pounds. She also says she's been coughing up some thick mucus and 40she has sinusitis because of sinus pressure that she is experiencing. She been taking some Mucinex and Flonase for that.  Recommended if it's not cleared up in 7-10 days then see her primary doctor.  Tarri Fuller PAC

## 2015-03-27 ENCOUNTER — Emergency Department (HOSPITAL_COMMUNITY): Payer: No Typology Code available for payment source

## 2015-03-27 ENCOUNTER — Ambulatory Visit: Payer: No Typology Code available for payment source | Admitting: Internal Medicine

## 2015-03-27 ENCOUNTER — Encounter (HOSPITAL_COMMUNITY): Payer: Self-pay | Admitting: *Deleted

## 2015-03-27 ENCOUNTER — Inpatient Hospital Stay (HOSPITAL_COMMUNITY): Payer: No Typology Code available for payment source

## 2015-03-27 ENCOUNTER — Inpatient Hospital Stay (HOSPITAL_COMMUNITY)
Admission: EM | Admit: 2015-03-27 | Discharge: 2015-03-31 | DRG: 292 | Disposition: A | Discharge status: Home / Self Care | Payer: No Typology Code available for payment source | Attending: Internal Medicine | Admitting: Internal Medicine

## 2015-03-27 ENCOUNTER — Telehealth: Payer: Self-pay | Admitting: Cardiology

## 2015-03-27 DIAGNOSIS — Z79899 Other long term (current) drug therapy: Secondary | ICD-10-CM | POA: Diagnosis not present

## 2015-03-27 DIAGNOSIS — E785 Hyperlipidemia, unspecified: Secondary | ICD-10-CM | POA: Diagnosis present

## 2015-03-27 DIAGNOSIS — E871 Hypo-osmolality and hyponatremia: Secondary | ICD-10-CM | POA: Diagnosis present

## 2015-03-27 DIAGNOSIS — I5031 Acute diastolic (congestive) heart failure: Secondary | ICD-10-CM | POA: Diagnosis not present

## 2015-03-27 DIAGNOSIS — Z825 Family history of asthma and other chronic lower respiratory diseases: Secondary | ICD-10-CM

## 2015-03-27 DIAGNOSIS — J440 Chronic obstructive pulmonary disease with acute lower respiratory infection: Secondary | ICD-10-CM | POA: Diagnosis present

## 2015-03-27 DIAGNOSIS — Z90710 Acquired absence of both cervix and uterus: Secondary | ICD-10-CM

## 2015-03-27 DIAGNOSIS — Z8249 Family history of ischemic heart disease and other diseases of the circulatory system: Secondary | ICD-10-CM

## 2015-03-27 DIAGNOSIS — J209 Acute bronchitis, unspecified: Secondary | ICD-10-CM | POA: Diagnosis present

## 2015-03-27 DIAGNOSIS — R1310 Dysphagia, unspecified: Secondary | ICD-10-CM

## 2015-03-27 DIAGNOSIS — R0600 Dyspnea, unspecified: Secondary | ICD-10-CM | POA: Insufficient documentation

## 2015-03-27 DIAGNOSIS — E876 Hypokalemia: Secondary | ICD-10-CM | POA: Diagnosis present

## 2015-03-27 DIAGNOSIS — D72829 Elevated white blood cell count, unspecified: Secondary | ICD-10-CM | POA: Diagnosis present

## 2015-03-27 DIAGNOSIS — I509 Heart failure, unspecified: Secondary | ICD-10-CM

## 2015-03-27 DIAGNOSIS — I5033 Acute on chronic diastolic (congestive) heart failure: Secondary | ICD-10-CM | POA: Diagnosis present

## 2015-03-27 DIAGNOSIS — J329 Chronic sinusitis, unspecified: Secondary | ICD-10-CM | POA: Diagnosis present

## 2015-03-27 DIAGNOSIS — I10 Essential (primary) hypertension: Secondary | ICD-10-CM | POA: Diagnosis present

## 2015-03-27 DIAGNOSIS — G4733 Obstructive sleep apnea (adult) (pediatric): Secondary | ICD-10-CM | POA: Diagnosis present

## 2015-03-27 DIAGNOSIS — R0609 Other forms of dyspnea: Secondary | ICD-10-CM | POA: Insufficient documentation

## 2015-03-27 DIAGNOSIS — N179 Acute kidney failure, unspecified: Secondary | ICD-10-CM | POA: Diagnosis present

## 2015-03-27 DIAGNOSIS — J441 Chronic obstructive pulmonary disease with (acute) exacerbation: Secondary | ICD-10-CM | POA: Diagnosis present

## 2015-03-27 DIAGNOSIS — Z6841 Body Mass Index (BMI) 40.0 and over, adult: Secondary | ICD-10-CM | POA: Diagnosis not present

## 2015-03-27 DIAGNOSIS — I2510 Atherosclerotic heart disease of native coronary artery without angina pectoris: Secondary | ICD-10-CM | POA: Diagnosis present

## 2015-03-27 DIAGNOSIS — I5032 Chronic diastolic (congestive) heart failure: Secondary | ICD-10-CM | POA: Diagnosis present

## 2015-03-27 DIAGNOSIS — E66813 Obesity, class 3: Secondary | ICD-10-CM | POA: Diagnosis present

## 2015-03-27 LAB — TSH: TSH: 1.546 u[IU]/mL (ref 0.350–4.500)

## 2015-03-27 LAB — URINALYSIS, ROUTINE W REFLEX MICROSCOPIC
BILIRUBIN URINE: NEGATIVE
GLUCOSE, UA: NEGATIVE mg/dL
HGB URINE DIPSTICK: NEGATIVE
Ketones, ur: NEGATIVE mg/dL
Leukocytes, UA: NEGATIVE
Nitrite: NEGATIVE
PH: 6 (ref 5.0–8.0)
Protein, ur: NEGATIVE mg/dL
SPECIFIC GRAVITY, URINE: 1.017 (ref 1.005–1.030)

## 2015-03-27 LAB — RAPID URINE DRUG SCREEN, HOSP PERFORMED
Amphetamines: NOT DETECTED
BARBITURATES: NOT DETECTED
Benzodiazepines: NOT DETECTED
Cocaine: NOT DETECTED
Opiates: POSITIVE — AB
TETRAHYDROCANNABINOL: NOT DETECTED

## 2015-03-27 LAB — CBC WITH DIFFERENTIAL/PLATELET
Basophils Absolute: 0 10*3/uL (ref 0.0–0.1)
Basophils Relative: 0 %
Eosinophils Absolute: 0.2 10*3/uL (ref 0.0–0.7)
Eosinophils Relative: 1 %
HEMATOCRIT: 38.3 % (ref 36.0–46.0)
HEMOGLOBIN: 12.4 g/dL (ref 12.0–15.0)
LYMPHS ABS: 2.9 10*3/uL (ref 0.7–4.0)
LYMPHS PCT: 18 %
MCH: 30.2 pg (ref 26.0–34.0)
MCHC: 32.4 g/dL (ref 30.0–36.0)
MCV: 93.4 fL (ref 78.0–100.0)
Monocytes Absolute: 0.8 10*3/uL (ref 0.1–1.0)
Monocytes Relative: 5 %
NEUTROS ABS: 12.6 10*3/uL — AB (ref 1.7–7.7)
NEUTROS PCT: 76 %
Platelets: 231 10*3/uL (ref 150–400)
RBC: 4.1 MIL/uL (ref 3.87–5.11)
RDW: 13.8 % (ref 11.5–15.5)
WBC: 16.5 10*3/uL — AB (ref 4.0–10.5)

## 2015-03-27 LAB — BASIC METABOLIC PANEL
ANION GAP: 12 (ref 5–15)
BUN: 46 mg/dL — ABNORMAL HIGH (ref 6–20)
CHLORIDE: 88 mmol/L — AB (ref 101–111)
CO2: 32 mmol/L (ref 22–32)
Calcium: 9.1 mg/dL (ref 8.9–10.3)
Creatinine, Ser: 1.57 mg/dL — ABNORMAL HIGH (ref 0.44–1.00)
GFR calc non Af Amer: 36 mL/min — ABNORMAL LOW (ref >60)
GFR, EST AFRICAN AMERICAN: 41 mL/min — AB (ref >60)
GLUCOSE: 133 mg/dL — AB (ref 65–99)
Potassium: 2.7 mmol/L — CL (ref 3.5–5.1)
Sodium: 132 mmol/L — ABNORMAL LOW (ref 135–145)

## 2015-03-27 LAB — EXPECTORATED SPUTUM ASSESSMENT W REFEX TO RESP CULTURE

## 2015-03-27 LAB — CBC
HEMATOCRIT: 39.6 % (ref 36.0–46.0)
HEMOGLOBIN: 12.9 g/dL (ref 12.0–15.0)
MCH: 30.5 pg (ref 26.0–34.0)
MCHC: 32.6 g/dL (ref 30.0–36.0)
MCV: 93.6 fL (ref 78.0–100.0)
Platelets: 230 10*3/uL (ref 150–400)
RBC: 4.23 MIL/uL (ref 3.87–5.11)
RDW: 13.8 % (ref 11.5–15.5)
WBC: 16.5 10*3/uL — AB (ref 4.0–10.5)

## 2015-03-27 LAB — CREATININE, SERUM
CREATININE: 1.3 mg/dL — AB (ref 0.44–1.00)
GFR, EST AFRICAN AMERICAN: 52 mL/min — AB (ref >60)
GFR, EST NON AFRICAN AMERICAN: 45 mL/min — AB (ref >60)

## 2015-03-27 LAB — BRAIN NATRIURETIC PEPTIDE: B NATRIURETIC PEPTIDE 5: 20 pg/mL (ref 0.0–100.0)

## 2015-03-27 LAB — EXPECTORATED SPUTUM ASSESSMENT W GRAM STAIN, RFLX TO RESP C

## 2015-03-27 LAB — TROPONIN I: Troponin I: <0.03 ng/mL (ref <0.031)

## 2015-03-27 MED ORDER — IPRATROPIUM-ALBUTEROL 0.5-2.5 (3) MG/3ML IN SOLN
3.0000 mL | Freq: Four times a day (QID) | RESPIRATORY_TRACT | Status: DC
  Administered 2015-03-27: 3 mL via RESPIRATORY_TRACT
  Filled 2015-03-27: qty 3

## 2015-03-27 MED ORDER — IPRATROPIUM-ALBUTEROL 0.5-2.5 (3) MG/3ML IN SOLN: 3.0000 mL | RESPIRATORY_TRACT | Status: DC | PRN

## 2015-03-27 MED ORDER — DIPHENHYDRAMINE HCL 50 MG/ML IJ SOLN: 25.0000 mg | Freq: Four times a day (QID) | INTRAMUSCULAR | Status: DC | PRN

## 2015-03-27 MED ORDER — ACETAMINOPHEN 325 MG PO TABS: 650.0000 mg | ORAL_TABLET | Freq: Four times a day (QID) | ORAL | Status: DC | PRN

## 2015-03-27 MED ORDER — PERFLUTREN LIPID MICROSPHERE
1.0000 mL | INTRAVENOUS | Status: DC | PRN
  Administered 2015-03-27: 4 mL via INTRAVENOUS
  Filled 2015-03-27: qty 10

## 2015-03-27 MED ORDER — BISACODYL 5 MG PO TBEC
15.0000 mg | DELAYED_RELEASE_TABLET | Freq: Every day | ORAL | Status: DC | PRN
  Administered 2015-03-27: 15 mg via ORAL
  Filled 2015-03-27: qty 3

## 2015-03-27 MED ORDER — FUROSEMIDE 10 MG/ML IJ SOLN
40.0000 mg | Freq: Two times a day (BID) | INTRAMUSCULAR | Status: DC
Start: 2015-03-28 — End: 2015-03-30
  Administered 2015-03-28 – 2015-03-29 (×4): 40 mg via INTRAVENOUS
  Filled 2015-03-27 (×4): qty 4

## 2015-03-27 MED ORDER — ISOSORB DINITRATE-HYDRALAZINE 20-37.5 MG PO TABS
1.0000 | ORAL_TABLET | Freq: Three times a day (TID) | ORAL | Status: DC
  Administered 2015-03-27 – 2015-03-30 (×12): 1 via ORAL
  Filled 2015-03-27 (×12): qty 1

## 2015-03-27 MED ORDER — FLUTICASONE PROPIONATE 50 MCG/ACT NA SUSP
2.0000 | Freq: Two times a day (BID) | NASAL | Status: DC | PRN
  Filled 2015-03-27: qty 16

## 2015-03-27 MED ORDER — ROSUVASTATIN CALCIUM 20 MG PO TABS
20.0000 mg | ORAL_TABLET | Freq: Every day | ORAL | Status: DC
  Administered 2015-03-27 – 2015-03-30 (×4): 20 mg via ORAL
  Filled 2015-03-27 (×4): qty 1

## 2015-03-27 MED ORDER — ONDANSETRON HCL 4 MG/2ML IJ SOLN: 4.0000 mg | Freq: Four times a day (QID) | INTRAMUSCULAR | Status: DC | PRN

## 2015-03-27 MED ORDER — CARVEDILOL 3.125 MG PO TABS
3.1250 mg | ORAL_TABLET | Freq: Two times a day (BID) | ORAL | Status: DC
  Administered 2015-03-27 – 2015-03-31 (×9): 3.125 mg via ORAL
  Filled 2015-03-27 (×9): qty 1

## 2015-03-27 MED ORDER — PERFLUTREN LIPID MICROSPHERE
INTRAVENOUS | Status: AC
  Filled 2015-03-27: qty 10

## 2015-03-27 MED ORDER — ENOXAPARIN SODIUM 80 MG/0.8ML ~~LOC~~ SOLN
65.0000 mg | SUBCUTANEOUS | Status: DC
  Administered 2015-03-27 – 2015-03-30 (×4): 65 mg via SUBCUTANEOUS
  Filled 2015-03-27 (×4): qty 0.8

## 2015-03-27 MED ORDER — TRAMADOL HCL 50 MG PO TABS
50.0000 mg | ORAL_TABLET | Freq: Four times a day (QID) | ORAL | Status: DC | PRN
  Administered 2015-03-27 – 2015-03-31 (×2): 50 mg via ORAL
  Filled 2015-03-27 (×2): qty 1

## 2015-03-27 MED ORDER — ASPIRIN EC 81 MG PO TBEC
81.0000 mg | DELAYED_RELEASE_TABLET | Freq: Every day | ORAL | Status: DC
  Administered 2015-03-27 – 2015-03-30 (×4): 81 mg via ORAL
  Filled 2015-03-27 (×4): qty 1

## 2015-03-27 MED ORDER — HYDROMORPHONE HCL 1 MG/ML IJ SOLN
1.0000 mg | INTRAMUSCULAR | Status: DC | PRN
  Administered 2015-03-27 – 2015-03-30 (×11): 1 mg via INTRAVENOUS
  Filled 2015-03-27 (×11): qty 1

## 2015-03-27 MED ORDER — SPIRONOLACTONE 25 MG PO TABS
12.5000 mg | ORAL_TABLET | Freq: Two times a day (BID) | ORAL | Status: DC
  Administered 2015-03-27 – 2015-03-31 (×9): 12.5 mg via ORAL
  Filled 2015-03-27 (×9): qty 1

## 2015-03-27 MED ORDER — SODIUM CHLORIDE 0.9% FLUSH
3.0000 mL | Freq: Two times a day (BID) | INTRAVENOUS | Status: DC
  Administered 2015-03-27 – 2015-03-30 (×7): 3 mL via INTRAVENOUS

## 2015-03-27 MED ORDER — POTASSIUM CHLORIDE CRYS ER 20 MEQ PO TBCR
40.0000 meq | EXTENDED_RELEASE_TABLET | Freq: Three times a day (TID) | ORAL | Status: DC
  Administered 2015-03-27 – 2015-03-30 (×12): 40 meq via ORAL
  Filled 2015-03-27 (×12): qty 2

## 2015-03-27 MED ORDER — GUAIFENESIN ER 600 MG PO TB12
600.0000 mg | ORAL_TABLET | Freq: Two times a day (BID) | ORAL | Status: DC
  Administered 2015-03-27 – 2015-03-30 (×7): 600 mg via ORAL
  Filled 2015-03-27 (×7): qty 1

## 2015-03-27 MED ORDER — ENOXAPARIN SODIUM 40 MG/0.4ML ~~LOC~~ SOLN
40.0000 mg | SUBCUTANEOUS | Status: DC
Start: 2015-03-27 — End: 2015-03-27

## 2015-03-27 MED ORDER — ONDANSETRON HCL 4 MG PO TABS: 4.0000 mg | ORAL_TABLET | Freq: Four times a day (QID) | ORAL | Status: DC | PRN

## 2015-03-27 MED ORDER — DEXTROSE 5 % IV SOLN
500.0000 mg | INTRAVENOUS | Status: DC
  Administered 2015-03-27 – 2015-03-29 (×3): 500 mg via INTRAVENOUS
  Filled 2015-03-27 (×3): qty 500

## 2015-03-27 MED ORDER — DEXTROSE 5 % IV SOLN
1.0000 g | INTRAVENOUS | Status: DC
  Administered 2015-03-27 – 2015-03-30 (×4): 1 g via INTRAVENOUS
  Filled 2015-03-27 (×4): qty 10

## 2015-03-27 MED ORDER — POTASSIUM CHLORIDE CRYS ER 20 MEQ PO TBCR
40.0000 meq | EXTENDED_RELEASE_TABLET | Freq: Once | ORAL | Status: DC
  Filled 2015-03-27: qty 2

## 2015-03-27 MED ORDER — FUROSEMIDE 10 MG/ML IJ SOLN: 20.0000 mg | Freq: Every day | INTRAMUSCULAR | Status: DC

## 2015-03-27 NOTE — Consult Note (Signed)
CARDIOLOGY CONSULT NOTE   Patient ID: [name] MRN: [id number] DOB/AGE: [birth year] 57 y.o.  Admit date: 03/27/2015  Primary Physician   Bartholome Bill, MD Primary Cardiologist   Dr Percival Spanish Reason for Consultation   CHF  [id number] [name] is a 55 y.o. year old female with a history of D-CHF, HTN, morbid obesity, OSA on CPAP, minimal CAD by remote cath. Dry weight about 292 lbs.  Phone notes over the weekend regarding weight gain to 301>>metolazone 2.5 mg>>light-headed and metolazone held, cough w/ thick mucus and sinus pressure. Symptoms worsened and pt came to ER.  [name] states that she is doing her best to be compliant with dietary and fluid restrictions. She drinks 12-4 ounce glasses of water daily. She has not been eating any foods with salt added, and is using Mrs. Dash when she cooks. She states her by mouth intake has been poor recently because she has not felt like eating. She had some watermelon and grapes the other day. But not much else. She is compliant with her medications.  She describes orthopnea, PND, and states that even with CPAP, she could not get her breath very well. She has not had significant lower extremity edema. Her weight this morning on her home scales was 294 pounds    her only other major complaint is that of sinus congestion and headache from this. She has not had palpitations or any bleeding issues. She has IBS and struggles with constipation. She does 5000 steps a day during the week and about 2000 per day on the weekends.   Past Medical History  Diagnosis Date  . Common migraine   . Morbid obesity ([place])   . Hyperlipidemia   . OSA (obstructive sleep apnea)     CPAP dependent  . Carpal tunnel syndrome   . Hypertension   . Allergic rhinitis   . CAD (coronary artery disease)     LHC (11/29/1999):  EF 60%; no significant CAD.  [name] Chronic diastolic CHF (congestive heart failure) ([place])     a. Echo (01/21/11):  Vigorous LVF, EF  65-70%, no RWMA, Gr 2 DD. ;  b.  Echo (12/15): Moderate LVH, EF 71-24%, grade 2 diastolic dysfunction, mild LAE, normal RV function  . Diverticulitis   . Acute on chronic diastolic CHF (congestive heart failure) ([place]) 04/11/2014  . Sinusitis, chronic 03/10/2012    CT sinuses 02/2012:  Chronic sinusitis with small A/F levels >> rx by ENT with prolonged augmentin F/u ct sinuses 04/2012:  Persistent sinus thickening >> rx with levaquin and clinda for 30 days.  Told to return for sinus scan in 6 weeks >> no showed for followup visit.  CT sinuses 11/2013:  Acute and chronic sinusitis noted.    [name] PNEUMONIA, ORGAN UNSPECIFIED 03/01/2009    Qualifier: Diagnosis of  By: [name] CMA, [name]    . Acute suppurative otitis media without spontaneous rupture of eardrum 02/04/2007    Centricity Description: OTITIS MEDIA, SUPPURATIVE, ACUTE, BILATERAL Qualifier: Diagnosis of  By: [name] MD, [name]  Centricity Description: OTITIS MEDIA, SUPPURATIVE, ACUTE Qualifier: Diagnosis of  By: [name] MD, [name]   . Carpal tunnel syndrome 06/09/2006    Qualifier: Diagnosis of  By: Marca Ancona RMA, Lucy    . IBS (irritable bowel syndrome)   . Diastolic heart failure ([place])   . Hepatic steatosis   . Hiatal hernia   . Hepatomegaly   . Internal hemorrhoids      Past Surgical  History  Procedure Laterality Date  . Tubal ligation    . Tonsillectomy    . Uvulopalatopharyngoplasty    . Cesarean section    . Nasal sinus surgery    . Partial hysterectomy      Allergies  Allergen Reactions  . Sulfonamide Derivatives Anaphylaxis, Swelling and Rash    Swelling tongue and ear   . Doxycycline Nausea And Vomiting  . Latex Hives  . Ciprofloxacin Rash  . Hydrocortisone Rash  . Neomycin Rash    I have reviewed the patient's current medications . potassium chloride  40 mEq Oral Once       Medication Sig  albuterol (PROVENTIL HFA;VENTOLIN HFA) 108 (90 BASE) MCG/ACT inhaler Inhale 1-2 puffs into the lungs every 6 (six) hours as  needed for wheezing or shortness of breath.  aspirin EC 81 MG EC tablet Take 1 tablet (81 mg total) by mouth daily.  bisacodyl (DULCOLAX) 5 MG EC tablet Take 15 mg by mouth daily as needed for moderate constipation.  Blood Pressure Monitoring (ADULT BLOOD PRESSURE CUFF LG) KIT 1 kit by Does not apply route daily.  dextromethorphan-guaiFENesin (MUCINEX DM) 30-600 MG 12hr tablet Take 1 tablet by mouth every 12 (twelve) hours.  fluticasone (FLONASE) 50 MCG/ACT nasal spray Place 2 sprays into both nostrils 2 (two) times daily as needed for allergies or rhinitis.  furosemide (LASIX) 40 MG tablet Take 2 tablets (80 mg total) by mouth 2 (two) times daily. For swelling or fluid retention.  isosorbide-hydrALAZINE (BIDIL) 20-37.5 MG tablet Take 1 tablet by mouth 3 (three) times daily.  losartan (COZAAR) 100 MG tablet TAKE 1 TABLET (100 MG TOTAL) BY MOUTH DAILY.  metolazone (ZAROXOLYN) 2.5 MG tablet Take 1 tablet (2.5 mg total) by mouth daily as needed. see notes Patient taking differently: Take 2.5 mg by mouth daily as needed (for fluid).   omega-3 acid ethyl esters (LOVAZA) 1 g capsule Take 1 capsule (1 g total) by mouth 2 (two) times daily. Patient taking differently: Take 1 g by mouth 3 (three) times daily.   potassium chloride SA (KLOR-CON M20) 20 MEQ tablet Take 2 tablets (40 mEq total) by mouth 3 (three) times daily.  rosuvastatin (CRESTOR) 20 MG tablet Take 1 tablet (20 mg total) by mouth at bedtime.  spironolactone (ALDACTONE) 25 MG tablet Take 0.5 tablets (12.5 mg total) by mouth 2 (two) times daily.  topiramate (TOPAMAX) 25 MG tablet Take 25 mg by mouth 2 (two) times daily as needed (for headaches).      Social History   Social History  . Marital Status: Widowed    Spouse Name: N/A  . Number of Children: 2  . Years of Education: N/A   Occupational History  . Retired    Social History Main Topics  . Smoking status: Never Smoker   . Smokeless tobacco: Never Used  . Alcohol Use: No  .  Drug Use: No  . Sexual Activity: Not on file   Other Topics Concern  . Not on file   Social History Narrative   Charity fundraiser, Oceanographer.   Widowed, lives with son.    Family Status  Relation Status Death Age  . Mother Alive   . Father Alive    Family History  Problem Relation Age of Onset  . Coronary artery disease      1st degree relatvie <50  . Breast cancer      aunt- ? paternal or maternal  . Asthma Sister   . Heart disease Mother   .  Heart attack Mother   . Hypertension Mother   . Stroke Neg Hx      ROS:  Full 14 point review of systems complete and found to be negative unless listed above.  Physical Exam: Blood pressure 119/70, pulse 111, temperature 98.2 F (36.8 C), temperature source Oral, resp. rate 27, weight 298 lb 6.4 oz (135.353 kg), SpO2 98 %.  General: Well developed, well nourished, female in no acute distress at rest  Head: Eyes PERRLA, No xanthomas.   Normocephalic and atraumatic, oropharynx without edema or exudate. Dentition: good  Lungs: decreased breath sounds bases, few rales  Heart: HRRR S1 S2, no rub/gallop,  2/6 murmur, pulses are 2+ all 4  extrem.   Neck: No carotid bruits. No lymphadenopathy.  JVD - could not assess secondary to body habitus . Abdomen: Bowel sounds present, abdomen soft and non-tender without masses or hernias noted. Msk:  No spine or cva tenderness. No weakness, no joint deformities or effusions. Extremities: No clubbing or cyanosis.  No  edema.  Neuro: Alert and oriented X 3. No focal deficits noted. Psych:  Good affect, responds appropriately Skin: No rashes or lesions noted.  Labs:   Lab Results  Component Value Date   WBC 16.5* 03/27/2015   HGB 12.4 03/27/2015   HCT 38.3 03/27/2015   MCV 93.4 03/27/2015   PLT 231 03/27/2015    Recent Labs Lab 03/27/15 0652  NA 132*  K 2.7*  CL 88*  CO2 32  BUN 46*  CREATININE 1.57*  CALCIUM 9.1  GLUCOSE 133*    Recent Labs  03/27/15 0652  TROPONINI  <0.03   B NATRIURETIC PEPTIDE  Date/Time Value Ref Range Status  03/27/2015 06:52 AM 20.0 0.0 - 100.0 pg/mL Final  02/19/2015 09:10 AM 52.0 0.0 - 100.0 pg/mL Final   Lab Results  Component Value Date   CHOL 169 02/20/2015   HDL 35* 02/20/2015   LDLCALC 72 02/20/2015   TRIG 310* 02/20/2015    Echo: 02/20/2015 - Left ventricle: The cavity size was normal. Wall thickness was increased in a pattern of mild LVH. Systolic function was normal. The estimated ejection fraction was in the range of 60% to 65%.  ECG: 03/27/2015 SR, rate 94, IVCD w/ QRS 114 ms  Radiology:  Dg Chest 2 View 03/27/2015  CLINICAL DATA:  Worsening cough, congestion, shortness of breath, and low grade fever since Friday. EXAM: CHEST  2 VIEW COMPARISON:  02/19/2015 FINDINGS: Normal heart size and pulmonary vascularity. No focal airspace disease or consolidation in the lungs. No blunting of costophrenic angles. No pneumothorax. Mediastinal contours appear intact. Degenerative changes in the spine and shoulders. Tortuous aorta. IMPRESSION: No active cardiopulmonary disease. Electronically Signed   By: Lucienne Capers M.D.   On: 03/27/2015 06:34   Ct Chest Wo Contrast 03/27/2015  CLINICAL DATA:  Shortness of breath with productive cough. Oral swelling. EXAM: CT CHEST WITHOUT CONTRAST TECHNIQUE: Multidetector CT imaging of the chest was performed following the standard protocol without IV contrast. COMPARISON:  12/31/2013 FINDINGS: The central airways are patent. The lungs are clear. There is no pleural effusion or pneumothorax. There are no pathologically enlarged axillary, hilar or mediastinal lymph nodes. The heart size is normal. There is no pericardial effusion. The thoracic aorta is normal in caliber. Review of bone windows demonstrates no focal lytic or sclerotic lesions. Limited non-contrast images of the upper abdomen were obtained. The adrenal glands appear normal. There is a small hiatal hernia. IMPRESSION: No acute  cardiopulmonary disease for  his Electronically Signed   By: Kathreen Devoid   On: 03/27/2015 09:37     ASSESSMENT AND PLAN:   The patient was seen today by Dr Johnsie Cancel, the patient evaluated and the data reviewed.   1. Acute on chronic diastolic CHF:  - Her weight is minimally above her dry weight. She has no lower extremity edema, no CHF on her chest x-ray or chest CT and her BNP is normal. - Her BUN and creatinine are elevated above her baseline. - I do not feel she is significantly volume overloaded at this time and would not diurese.  2. Hypokalemia: - She has been given 40 mEq 1. - additional 40 mEq is ordered - Recheck per primary care, ordered  3. Shortness of breath - I am not finding volume overload on exam. - There is no significant abnormality by chest x-ray or chest CT - However, her white count is elevated with a mild left shift - She has been working to increase her activity so I am not sure that she has significant risk factors for PE - As her weight has not significantly changed, and her symptoms are sudden in onset, obesity hypoventilation seems unlikely -  per IM  Active Problems:   Acute on chronic diastolic CHF (congestive heart failure), NYHA class 2 ([place])   SignedRosaria Ferries, PA-C 03/27/2015 9:01 AM Beeper [phone]  Co-Sign MD  Patient examined chart reviewed.  Obese black female Complaining of feeling cold and chills Headache and sinus congestion.  At dry weight CXR no CHF and BNP normal no signs of Diastolic heart failure.  Would further w/u infection with chills and elevated WBC.  CT with no  PE or acute chest findings.  No further cardiac w/u needed  Will sign off  Jenkins Rouge

## 2015-03-27 NOTE — ED Notes (Signed)
Walked in hall PT stat at 97% pulse 117

## 2015-03-27 NOTE — ED Notes (Signed)
Patient had an additional complaint of intermittent right sided body numbness x2

## 2015-03-27 NOTE — ED Provider Notes (Signed)
CSN: [id number]     Arrival date & time 03/27/15  [id number] History   First MD Initiated Contact with Patient 03/27/15 [phone]     Chief Complaint  Patient presents with  . Shortness of Breath     (Consider location/radiation/quality/duration/timing/severity/associated sxs/prior Treatment) HPI Patient reports that she's been short of breath for several days now. She reports that she has increased her Lasix dose over the weekend.  She states however she continues to feel short of breath. She does not have a  Chest pain.  Shortness of breath is worse with lying flat. She reports she is getting some amount of cough  That is mild. No fever. No increased swelling of her legs. She reports she has generalized headache and would like some pain medicine for it.No fever or neck stiffness. Past Medical History  Diagnosis Date  . Common migraine   . Morbid obesity ([place])   . Hyperlipidemia   . OSA (obstructive sleep apnea)     CPAP dependent  . Carpal tunnel syndrome   . Hypertension   . Allergic rhinitis   . CAD (coronary artery disease)     LHC (11/29/1999):  EF 60%; no significant CAD.  [name] Chronic diastolic CHF (congestive heart failure) ([place])     a. Echo (01/21/11):  Vigorous LVF, EF 65-70%, no RWMA, Gr 2 DD. ;  b.  Echo (12/15): Moderate LVH, EF 123456, grade 2 diastolic dysfunction, mild LAE, normal RV function  . Diverticulitis   . Acute on chronic diastolic CHF (congestive heart failure) ([place]) 04/11/2014  . Sinusitis, chronic 03/10/2012    CT sinuses 02/2012:  Chronic sinusitis with small A/F levels >> rx by ENT with prolonged augmentin F/u ct sinuses 04/2012:  Persistent sinus thickening >> rx with levaquin and clinda for 30 days.  Told to return for sinus scan in 6 weeks >> no showed for followup visit.  CT sinuses 11/2013:  Acute and chronic sinusitis noted.    [name] PNEUMONIA, ORGAN UNSPECIFIED 03/01/2009    Qualifier: Diagnosis of  By: [name] CMA, [name]    . Acute suppurative otitis media without  spontaneous rupture of eardrum 02/04/2007    Centricity Description: OTITIS MEDIA, SUPPURATIVE, ACUTE, BILATERAL Qualifier: Diagnosis of  By: [name] MD, [name]  Centricity Description: OTITIS MEDIA, SUPPURATIVE, ACUTE Qualifier: Diagnosis of  By: [name] MD, [name]   . Carpal tunnel syndrome 06/09/2006    Qualifier: Diagnosis of  By: Marca Ancona RMA, Lucy    . IBS (irritable bowel syndrome)   . Diastolic heart failure ([place])   . Hepatic steatosis   . Hiatal hernia   . Hepatomegaly   . Internal hemorrhoids    Past Surgical History  Procedure Laterality Date  . Tubal ligation    . Tonsillectomy    . Uvulopalatopharyngoplasty    . Cesarean section    . Nasal sinus surgery    . Partial hysterectomy     Family History  Problem Relation Age of Onset  . Coronary artery disease      1st degree relatvie <50  . Breast cancer      aunt- ? paternal or maternal  . Asthma Sister   . Heart disease Mother   . Heart attack Mother   . Hypertension Mother   . Stroke Neg Hx    Social History  Substance Use Topics  . Smoking status: Never Smoker   . Smokeless tobacco: Never Used  . Alcohol Use: No   OB History    No  data available     Review of Systems 10 Systems reviewed and are negative for acute change except as noted in the HPI.    Allergies  Sulfonamide derivatives; Doxycycline; Latex; Ciprofloxacin; Hydrocortisone; and Neomycin  Home Medications   Prior to Admission medications   Medication Sig Start Date End Date Taking? Authorizing Provider  albuterol (PROVENTIL HFA;VENTOLIN HFA) 108 (90 BASE) MCG/ACT inhaler Inhale 1-2 puffs into the lungs every 6 (six) hours as needed for wheezing or shortness of breath. 03/20/14  Yes Ripley Fraise, MD  aspirin EC 81 MG EC tablet Take 1 tablet (81 mg total) by mouth daily. 02/23/15  Yes Ripudeep Krystal Eaton, MD  dextromethorphan-guaiFENesin (MUCINEX DM) 30-600 MG 12hr tablet Take 1 tablet by mouth every 12 (twelve) hours.   Yes Historical Provider, MD   fluticasone (FLONASE) 50 MCG/ACT nasal spray Place 2 sprays into both nostrils 2 (two) times daily as needed for allergies or rhinitis. 02/23/15  Yes Ripudeep Krystal Eaton, MD  furosemide (LASIX) 40 MG tablet Take 2 tablets (80 mg total) by mouth 2 (two) times daily. For swelling or fluid retention. 02/23/15  Yes Ripudeep Krystal Eaton, MD  isosorbide-hydrALAZINE (BIDIL) 20-37.5 MG tablet Take 1 tablet by mouth 3 (three) times daily. 02/23/15  Yes Ripudeep Krystal Eaton, MD  losartan (COZAAR) 100 MG tablet TAKE 1 TABLET (100 MG TOTAL) BY MOUTH DAILY. 02/24/15  Yes Minus Breeding, MD  metolazone (ZAROXOLYN) 2.5 MG tablet Take 1 tablet (2.5 mg total) by mouth daily as needed. see notes Patient taking differently: Take 2.5 mg by mouth daily as needed (for fluid).  11/29/14  Yes Minus Breeding, MD  omega-3 acid ethyl esters (LOVAZA) 1 g capsule Take 1 capsule (1 g total) by mouth 2 (two) times daily. Patient taking differently: Take 1 g by mouth 3 (three) times daily.  02/23/15  Yes Ripudeep Krystal Eaton, MD  rosuvastatin (CRESTOR) 20 MG tablet Take 1 tablet (20 mg total) by mouth at bedtime. 02/23/15  Yes Ripudeep Krystal Eaton, MD  spironolactone (ALDACTONE) 25 MG tablet Take 0.5 tablets (12.5 mg total) by mouth 2 (two) times daily. 02/23/15  Yes Ripudeep Krystal Eaton, MD  topiramate (TOPAMAX) 25 MG tablet Take 25 mg by mouth 2 (two) times daily as needed (for headaches).  03/03/15  Yes Historical Provider, MD  cefUROXime (CEFTIN) 500 MG tablet Take 1 tablet (500 mg total) by mouth 2 (two) times daily with a meal. 03/31/15   Kelvin Cellar, MD  potassium chloride SA (KLOR-CON M20) 20 MEQ tablet 2 tablets three times daily and one extra tablet as needed 03/31/15   Minus Breeding, MD  traMADol (ULTRAM) 50 MG tablet Take 1 tablet (50 mg total) by mouth every 6 (six) hours as needed for moderate pain. 03/31/15   Kelvin Cellar, MD   BP 137/86 mmHg  Pulse 84  Temp(Src) 98.2 F (36.8 C) (Oral)  Resp 20  Ht 5\' 3"  (1.6 m)  Wt 296 lb (134.265 kg)  BMI 52.45 kg/m2   SpO2 95% Physical Exam  Constitutional: She is oriented to person, place, and time.  Patient is morbidly obese. Nontoxic alert. No respiratory distress. Mental status is clear.  HENT:  Head: Normocephalic and atraumatic.  Right Ear: External ear normal.  Left Ear: External ear normal.  Mouth/Throat: Oropharyngeal exudate present.  Eyes: EOM are normal. Pupils are equal, round, and reactive to light.  Neck: Neck supple.  Cardiovascular: Normal rate, regular rhythm, normal heart sounds and intact distal pulses.   Pulmonary/Chest: Effort normal  and breath sounds normal.  Abdominal: Soft. Bowel sounds are normal. She exhibits no distension. There is no tenderness.  Musculoskeletal: She exhibits no edema or tenderness.  Neurological: She is alert and oriented to person, place, and time. No cranial nerve deficit. She exhibits normal muscle tone. Coordination normal.  Skin: Skin is warm and dry.  Psychiatric: She has a normal mood and affect.    ED Course  Procedures (including critical care time) Labs Review Labs Reviewed  BASIC METABOLIC PANEL - Abnormal; Notable for the following:    Sodium 132 (*)    Potassium 2.7 (*)    Chloride 88 (*)    Glucose, Bld 133 (*)    BUN 46 (*)    Creatinine, Ser 1.57 (*)    GFR calc non Af Amer 36 (*)    GFR calc Af Amer 41 (*)    All other components within normal limits  CBC WITH DIFFERENTIAL/PLATELET - Abnormal; Notable for the following:    WBC 16.5 (*)    Neutro Abs 12.6 (*)    All other components within normal limits  CBC - Abnormal; Notable for the following:    WBC 16.5 (*)    All other components within normal limits  CREATININE, SERUM - Abnormal; Notable for the following:    Creatinine, Ser 1.30 (*)    GFR calc non Af Amer 45 (*)    GFR calc Af Amer 52 (*)    All other components within normal limits  BASIC METABOLIC PANEL - Abnormal; Notable for the following:    Sodium 134 (*)    Potassium 2.8 (*)    Chloride 92 (*)     Glucose, Bld 135 (*)    BUN 30 (*)    All other components within normal limits  CBC - Abnormal; Notable for the following:    WBC 22.9 (*)    Hemoglobin 11.6 (*)    All other components within normal limits  URINE RAPID DRUG SCREEN, HOSP PERFORMED - Abnormal; Notable for the following:    Opiates POSITIVE (*)    All other components within normal limits  MAGNESIUM - Abnormal; Notable for the following:    Magnesium 2.5 (*)    All other components within normal limits  BASIC METABOLIC PANEL - Abnormal; Notable for the following:    Potassium 2.9 (*)    Chloride 89 (*)    CO2 34 (*)    Glucose, Bld 144 (*)    BUN 26 (*)    All other components within normal limits  BASIC METABOLIC PANEL - Abnormal; Notable for the following:    Chloride 95 (*)    Glucose, Bld 124 (*)    BUN 22 (*)    All other components within normal limits  CBC WITH DIFFERENTIAL/PLATELET - Abnormal; Notable for the following:    WBC 16.2 (*)    RBC 3.81 (*)    Hemoglobin 11.5 (*)    HCT 35.6 (*)    Neutro Abs 10.6 (*)    Monocytes Absolute 1.2 (*)    All other components within normal limits  CBC - Abnormal; Notable for the following:    WBC 11.7 (*)    RBC 3.78 (*)    Hemoglobin 11.3 (*)    HCT 35.8 (*)    All other components within normal limits  BASIC METABOLIC PANEL - Abnormal; Notable for the following:    Chloride 98 (*)    Glucose, Bld 119 (*)    BUN  24 (*)    All other components within normal limits  GLUCOSE, CAPILLARY - Abnormal; Notable for the following:    Glucose-Capillary 170 (*)    All other components within normal limits  CULTURE, EXPECTORATED SPUTUM-ASSESSMENT  CULTURE, RESPIRATORY (NON-EXPECTORATED)  URINE CULTURE  C DIFFICILE QUICK SCREEN W PCR REFLEX  TROPONIN I  BRAIN NATRIURETIC PEPTIDE  TSH  URINALYSIS, ROUTINE W REFLEX MICROSCOPIC (NOT AT [facility])  LEGIONELLA ANTIGEN, URINE  STREP PNEUMONIAE URINARY ANTIGEN  INFLUENZA PANEL BY PCR (TYPE A & B, H1N1)    Imaging  Review No results found. I have personally reviewed and evaluated these images and lab results as part of my medical decision-making.   EKG Interpretation   Date/Time:  Monday March 27 2015 05:53:54 EST Ventricular Rate:  94 PR Interval:  187 QRS Duration: 114 QT Interval:  398 QTC Calculation: 498 R Axis:   82 Text Interpretation:  Sinus rhythm Borderline intraventricular conduction  delay Low voltage with right axis deviation Borderline prolonged QT  interval no acute changes No significant change since last tracing  Confirmed by Kathrynn Humble, MD, Thelma Comp [phone]) on 03/27/2015 7:15:21 AM     Consult: Hospitalist consult for admission. MDM   Final diagnoses:  AKI (acute kidney injury) ([place])  Acute diastolic CHF (congestive heart failure), NYHA class 2 ([facility])  Hypokalemia   Patient with history of congestive heart failure. Increasing dyspnea despite increased Lasix consumption over weekend. Was admitted for dyspnea with severe comorbid illness and obesity.   Charlesetta Shanks, MD 04/02/15 1352

## 2015-03-27 NOTE — ED Notes (Signed)
Patient is alert and oriented x4.  She is complaining of shortness of breath with a productive cough and oral swelling that has been on going for a week.  Patient has a history of CHF.  Currently she has a headache 8 of 10 pain

## 2015-03-27 NOTE — Telephone Encounter (Signed)
Paged by Ms.Junkin. She reports ongoing SOB, weight up to 301 despite being down to 296 yesterday with metolazone. Describing orthopnea and dyspnea with walking around the house. Also having ongoing cough with sputum production. She believes she has gotten worse over the past few days. We discussed that she should be seen today and that if she was feeling up to it she could wait to be seen during normal office hours. She said that she felt too poorly and indicated she would rather go to the ER now for evaluation.

## 2015-03-27 NOTE — H&P (Addendum)
[facility] History and Physical  HIRA TRENT OFB:[id number] DOB: Jul 19, 1960 DOA: 03/27/2015  Referring physician: ED physician, Dr. Leonides Schanz  PCP: Bartholome Bill, MD   Chief Complaint:   HPI:  Patient is 55 year old female with known history of diastolic CHF, hypertension, morbid obesity, obstructive sleep apnea on CPAP, presented to [facility] ED with main concern of several days duration of progressively worsening dyspnea, productive cough of white and yellow sputum, subjective chills, difficulty swallowing due to phlegm, weight gain over 10 pounds. Patient denies fevers, no sick contacts or exposures. Reports she was otherwise feeling at her baseline. Currently denies chest pain, no specific urinary or abdominal concerns. She also denies lightheadedness, dizziness, dictations, headaches.  In emergency department, patient noted to be hemodynamically stable, heart rate up to 110, respiratory rate up to 27, blood pressure 165/53. WBCs 16 K, potassium 2.7,  creatinine 1.57. Cardiology consulted and TRH asked to admit for further evaluation.  Assessment and Plan:  1. Dyspnea due to Acute on chronic diastolic CHF, ? Acute COPD with bronchitis, in pt with known OSA/OHS - Per cardiology patient not significantly volume overloaded - pt certainly wheezing on exam so COPD is possible etiology  - Echocardiogram has been requested will follow-up on results - Placed on Lasix 40 mg IV BID - Weight on admission to 295 pounds, monitor daily weights, strict intake and output - CT chest pending - place on BD's scheduled and as needed, pt does not want steroids, says she is allergic to it  - place on empiric Zithromax and Rocephin for now, follow up on sputum analysis   2. Hypokalemia: - She has been given 40 mEq 1. - additional 40 mEq is ordered - BMP in the morning  3. Leukocytosis, ? COPD with bronchitis  - Unclear etiology, CT chest pending - We'll place on empiric antibiotics as noted  above  - Sputum analysis requested - Reevaluate clinical status in the morning  4. Morbid obesity - Body mass index is 52.3 kg/(m^2).  5. Acute kidney injury - Creatinine trending down, monitor with daily BMP while inpatient  6. Hyponatremia   - Mild, repeat BMP in the morning   7. Essential HTN - continue home medical regimen   8. Dysphagia - SLP requested   Lovenox SQ for DVT prophylaxis   Radiological Exams on Admission: Dg Chest 2 View 03/27/2015  No active cardiopulmonary disease.   Code Status: Full Family Communication: Pt at bedside Disposition Plan: Admit for further evaluation    Mart Piggs [facility] [phone]   Review of Systems:  Constitutional: Negative for fever, chills. Negative for diaphoresis.  HENT: Negative for hearing loss, tinnitus and ear discharge.   Eyes: Negative for blurred vision, double vision, photophobia, pain, discharge and redness.  Respiratory: Per history of present illness Cardiovascular: Negative for claudication and leg swelling.  Gastrointestinal: Negative for nausea, vomiting and abdominal pain.  Genitourinary: Negative for dysuria, urgency, frequency, hematuria and flank pain.  Musculoskeletal: Negative for myalgias, back pain, joint pain and falls.  Skin: Negative for itching and rash.  Neurological: Negative for dizziness and weakness.  Endo/Heme/Allergies: Negative for environmental allergies and polydipsia. Does not bruise/bleed easily.  Psychiatric/Behavioral: Negative for suicidal ideas. The patient is not nervous/anxious.      Past Medical History  Diagnosis Date  . Common migraine   . Morbid obesity ([place])   . Hyperlipidemia   . OSA (obstructive sleep apnea)     CPAP dependent  . Carpal tunnel syndrome   .  Hypertension   . Allergic rhinitis   . CAD (coronary artery disease)     LHC (11/29/1999):  EF 60%; no significant CAD.  [name] Chronic diastolic CHF (congestive heart failure) ([place])     a. Echo (01/21/11):  Vigorous  LVF, EF 65-70%, no RWMA, Gr 2 DD. ;  b.  Echo (12/15): Moderate LVH, EF 84-16%, grade 2 diastolic dysfunction, mild LAE, normal RV function  . Diverticulitis   . Acute on chronic diastolic CHF (congestive heart failure) ([place]) 04/11/2014  . Sinusitis, chronic 03/10/2012    CT sinuses 02/2012:  Chronic sinusitis with small A/F levels >> rx by ENT with prolonged augmentin F/u ct sinuses 04/2012:  Persistent sinus thickening >> rx with levaquin and clinda for 30 days.  Told to return for sinus scan in 6 weeks >> no showed for followup visit.  CT sinuses 11/2013:  Acute and chronic sinusitis noted.    [name] PNEUMONIA, ORGAN UNSPECIFIED 03/01/2009    Qualifier: Diagnosis of  By: [name] CMA, [name]    . Acute suppurative otitis media without spontaneous rupture of eardrum 02/04/2007    Centricity Description: OTITIS MEDIA, SUPPURATIVE, ACUTE, BILATERAL Qualifier: Diagnosis of  By: [name] MD, [name]  Centricity Description: OTITIS MEDIA, SUPPURATIVE, ACUTE Qualifier: Diagnosis of  By: [name] MD, [name]   . Carpal tunnel syndrome 06/09/2006    Qualifier: Diagnosis of  By: Marca Ancona RMA, Lucy    . IBS (irritable bowel syndrome)   . Diastolic heart failure ([place])   . Hepatic steatosis   . Hiatal hernia   . Hepatomegaly   . Internal hemorrhoids     Past Surgical History  Procedure Laterality Date  . Tubal ligation    . Tonsillectomy    . Uvulopalatopharyngoplasty    . Cesarean section    . Nasal sinus surgery    . Partial hysterectomy      Social History:  reports that she has never smoked. She has never used smokeless tobacco. She reports that she does not drink alcohol or use illicit drugs.  Allergies  Allergen Reactions  . Doxycycline Nausea And Vomiting  . Latex Hives  . Ciprofloxacin Rash  . Hydrocortisone Rash  . Neomycin Rash  . Sulfonamide Derivatives Swelling and Rash    Family History  Problem Relation Age of Onset  . Coronary artery disease      1st degree relatvie <50  . Breast  cancer      aunt- ? paternal or maternal  . Asthma Sister   . Heart disease Mother   . Heart attack Mother   . Hypertension Mother   . Stroke Neg Hx     Prior to Admission medications   Medication Sig Start Date End Date Taking? Authorizing Provider  albuterol (PROVENTIL HFA;VENTOLIN HFA) 108 (90 BASE) MCG/ACT inhaler Inhale 1-2 puffs into the lungs every 6 (six) hours as needed for wheezing or shortness of breath. 03/20/14   Ripley Fraise, MD  aspirin EC 81 MG EC tablet Take 1 tablet (81 mg total) by mouth daily. 02/23/15   Ripudeep Krystal Eaton, MD  bisacodyl (DULCOLAX) 5 MG EC tablet Take 15 mg by mouth daily as needed for moderate constipation.    Historical Provider, MD  Blood Pressure Monitoring (ADULT BLOOD PRESSURE CUFF LG) KIT 1 kit by Does not apply route daily. 03/13/15   Minus Breeding, MD  fluticasone (FLONASE) 50 MCG/ACT nasal spray Place 2 sprays into both nostrils 2 (two) times daily as needed for allergies or rhinitis. 02/23/15  Ripudeep Krystal Eaton, MD  furosemide (LASIX) 40 MG tablet Take 2 tablets (80 mg total) by mouth 2 (two) times daily. For swelling or fluid retention. 02/23/15   Ripudeep Krystal Eaton, MD  isosorbide-hydrALAZINE (BIDIL) 20-37.5 MG tablet Take 1 tablet by mouth 3 (three) times daily. 02/23/15   Ripudeep Krystal Eaton, MD  losartan (COZAAR) 100 MG tablet TAKE 1 TABLET (100 MG TOTAL) BY MOUTH DAILY. 02/24/15   Minus Breeding, MD  metolazone (ZAROXOLYN) 2.5 MG tablet Take 1 tablet (2.5 mg total) by mouth daily as needed. see notes 11/29/14   Minus Breeding, MD  omega-3 acid ethyl esters (LOVAZA) 1 g capsule Take 1 capsule (1 g total) by mouth 2 (two) times daily. 02/23/15   Ripudeep Krystal Eaton, MD  potassium chloride SA (KLOR-CON M20) 20 MEQ tablet Take 2 tablets (40 mEq total) by mouth 3 (three) times daily. 02/23/15   Ripudeep Krystal Eaton, MD  rosuvastatin (CRESTOR) 20 MG tablet Take 1 tablet (20 mg total) by mouth at bedtime. 02/23/15   Ripudeep Krystal Eaton, MD  spironolactone (ALDACTONE) 25 MG tablet Take 0.5  tablets (12.5 mg total) by mouth 2 (two) times daily. 02/23/15   Ripudeep Krystal Eaton, MD    Physical Exam: Filed Vitals:   03/27/15 6578 03/27/15 0730 03/27/15 0800 03/27/15 0828  BP:  106/57 115/55 119/70  Pulse:    111  Temp:      TempSrc:      Resp:  _0 Weight: 135.353 kg (298 lb 6.4 oz)     SpO2:    98%    Physical Exam  Constitutional: Appears well-developed and well-nourished, morbidly obese HENT: Normocephalic. External right and left ear normal. Oropharynx is clear and moist.  Eyes: Conjunctivae and EOM are normal. PERRLA, no scleral icterus.  Neck: Normal ROM. Neck supple. No JVD. No tracheal deviation. No thyromegaly.  CVS: RRR, S1/S2 +,  no gallops, no carotid bruit.  Pulmonary: diminished breath sounds at bases with expiratory wheezing  Abdominal: Soft. BS +,  no distension, tenderness, rebound or guarding.  Musculoskeletal: Normal range of motion.  Lymphadenopathy: No lymphadenopathy noted, cervical, inguinal. Neuro: Alert. Normal reflexes, muscle tone coordination. No cranial nerve deficit. Skin: Skin is warm and dry. No rash noted. Not diaphoretic. No erythema. No pallor.  Psychiatric: Normal mood and affect. Behavior, judgment, thought content normal.   Labs on Admission:  Basic Metabolic Panel:  Recent Labs Lab 03/27/15 0652  NA 132*  K 2.7*  CL 88*  CO2 32  GLUCOSE 133*  BUN 46*  CREATININE 1.57*  CALCIUM 9.1   CBC:  Recent Labs Lab 03/27/15 0652  WBC 16.5*  NEUTROABS 12.6*  HGB 12.4  HCT 38.3  MCV 93.4  PLT 231   Cardiac Enzymes:  Recent Labs Lab 03/27/15 0652  TROPONINI <0.03    EKG: Pending   If 7PM-7AM, please contact night-coverage [email or web address] Password [email or web address] 03/27/2015, 8:29 AM

## 2015-03-27 NOTE — Progress Notes (Signed)
Echocardiogram 2D Echocardiogram with Definity has been performed.  [name] 03/27/2015, 3:35 PM

## 2015-03-27 NOTE — ED Notes (Signed)
Patient ambulated to bathroom with assistance. Patient ambulated to room unassisted.

## 2015-03-27 NOTE — ED Notes (Signed)
Patient transported to X-ray 

## 2015-03-28 ENCOUNTER — Inpatient Hospital Stay (HOSPITAL_COMMUNITY): Payer: No Typology Code available for payment source

## 2015-03-28 LAB — BASIC METABOLIC PANEL
Anion gap: 11 (ref 5–15)
Anion gap: 13 (ref 5–15)
BUN: 30 mg/dL — AB (ref 6–20)
BUN: 26 mg/dL — AB (ref 6–20)
CHLORIDE: 92 mmol/L — AB (ref 101–111)
CHLORIDE: 89 mmol/L — AB (ref 101–111)
CO2: 31 mmol/L (ref 22–32)
CO2: 34 mmol/L — AB (ref 22–32)
Calcium: 9.2 mg/dL (ref 8.9–10.3)
Calcium: 9.5 mg/dL (ref 8.9–10.3)
Creatinine, Ser: 0.79 mg/dL (ref 0.44–1.00)
Creatinine, Ser: 0.77 mg/dL (ref 0.44–1.00)
GFR calc Af Amer: >60 mL/min (ref >60)
GFR calc Af Amer: >60 mL/min (ref >60)
GFR calc non Af Amer: >60 mL/min (ref >60)
GLUCOSE: 135 mg/dL — AB (ref 65–99)
GLUCOSE: 144 mg/dL — AB (ref 65–99)
POTASSIUM: 2.8 mmol/L — AB (ref 3.5–5.1)
POTASSIUM: 2.9 mmol/L — AB (ref 3.5–5.1)
SODIUM: 136 mmol/L (ref 135–145)
Sodium: 134 mmol/L — ABNORMAL LOW (ref 135–145)

## 2015-03-28 LAB — CBC
HCT: 36.5 % (ref 36.0–46.0)
Hemoglobin: 11.6 g/dL — ABNORMAL LOW (ref 12.0–15.0)
MCH: 30 pg (ref 26.0–34.0)
MCHC: 31.8 g/dL (ref 30.0–36.0)
MCV: 94.3 fL (ref 78.0–100.0)
PLATELETS: 225 10*3/uL (ref 150–400)
RBC: 3.87 MIL/uL (ref 3.87–5.11)
RDW: 14.1 % (ref 11.5–15.5)
WBC: 22.9 10*3/uL — AB (ref 4.0–10.5)

## 2015-03-28 LAB — C DIFFICILE QUICK SCREEN W PCR REFLEX
C DIFFICLE (CDIFF) ANTIGEN: NEGATIVE
C Diff interpretation: NEGATIVE
C Diff toxin: NEGATIVE

## 2015-03-28 LAB — LEGIONELLA ANTIGEN, URINE

## 2015-03-28 LAB — STREP PNEUMONIAE URINARY ANTIGEN: Strep Pneumo Urinary Antigen: NEGATIVE

## 2015-03-28 LAB — MAGNESIUM: Magnesium: 2.5 mg/dL — ABNORMAL HIGH (ref 1.7–2.4)

## 2015-03-28 MED ORDER — POTASSIUM CHLORIDE CRYS ER 20 MEQ PO TBCR
40.0000 meq | EXTENDED_RELEASE_TABLET | Freq: Two times a day (BID) | ORAL | Status: AC
  Administered 2015-03-28 (×2): 40 meq via ORAL
  Filled 2015-03-28 (×2): qty 2

## 2015-03-28 MED ORDER — ALBUTEROL SULFATE (2.5 MG/3ML) 0.083% IN NEBU
2.5000 mg | INHALATION_SOLUTION | Freq: Two times a day (BID) | RESPIRATORY_TRACT | Status: DC
  Administered 2015-03-28 – 2015-03-30 (×6): 2.5 mg via RESPIRATORY_TRACT
  Filled 2015-03-28 (×6): qty 3

## 2015-03-28 MED ORDER — IOHEXOL 300 MG/ML  SOLN
100.0000 mL | Freq: Once | INTRAMUSCULAR | Status: AC | PRN
  Administered 2015-03-28: 100 mL via INTRAVENOUS

## 2015-03-28 MED ORDER — IOHEXOL 300 MG/ML  SOLN
25.0000 mL | INTRAMUSCULAR | Status: AC
  Administered 2015-03-28: 25 mL via ORAL

## 2015-03-28 NOTE — Progress Notes (Signed)
Patient ID: [name], female   DOB: 09-18-1960, 55 y.o.   MRN: [id number]  [facility] PROGRESS NOTE  [name] [id number] DOB: Dec 25, 1960 DOA: 03/27/2015 PCP: Bartholome Bill, MD   Brief narrative:    Patient is 55 year old female with known history of diastolic CHF, hypertension, morbid obesity, obstructive sleep apnea on CPAP, presented to [facility] ED with main concern of several days duration of progressively worsening dyspnea, productive cough of white and yellow sputum, subjective chills, difficulty swallowing due to phlegm, weight gain over 10 pounds. Patient denies fevers, no sick contacts or exposures. Reports she was otherwise feeling at her baseline. Currently denies chest pain, no specific urinary or abdominal concerns. She also denies lightheadedness, dizziness, dictations, headaches.  In emergency department, patient noted to be hemodynamically stable, heart rate up to 110, respiratory rate up to 27, blood pressure 165/53. WBCs 16 K, potassium 2.7, creatinine 1.57. Cardiology consulted and TRH asked to admit for further evaluation.  Assessment/Plan:    1. Dyspnea due to Acute on chronic diastolic CHF, ? Acute COPD with bronchitis, in pt with known OSA/OHS - Per cardiology patient not significantly volume overloaded - pt was wheezing on exam on admission so COPD considered possible etiology  - Echocardiogram has been requested will follow-up on results - Placed on Lasix 40 mg IV BID, cr is stable and WNL this AM, plan to change to PO Lasix in next 24 hours  - Weight on admission to 295 pounds --> 292 lbs this AM - continue to monitor daily weights, strict intake and output - continue BD's scheduled and as needed - continue Zithromax and Rocephin day #/5  2. Hypokalemia, severe - She has been given 40 mEq 1. - continue to supplement, Mg is WNL  - BMP in the morning  3. Leukocytosis, ? COPD with bronchitis  - Unclear etiology, CT abd pending, C. Diff  pending  - UA and CT chest clear - some diarrhea this AM so C. Diff requested   4. Morbid obesity - Body mass index is 52.3 kg/(m^2).  5. Acute kidney injury - Creatinine trending down and is WNL   6. Hyponatremia  - Mild, repeat BMP in the morning   7. Essential HTN - continue home medical regimen   8. Dysphagia - SLP requested   Lovenox SQ for DVT prophylaxis   Code Status: Full.  Family Communication:  plan of care discussed with the patient Disposition Plan: Home when stable.   IV access:  Peripheral IV  Procedures and diagnostic studies:    Dg Chest 2 View 03/27/2015  No active cardiopulmonary disease.   Ct Chest Wo Contrast 03/27/2015   No acute cardiopulmonary disease  Medical Consultants:  Cardiology - signed off   Other Consultants:  PT SLP  IAnti-Infectives:   Zithromax 3/6 --> Rocephin 3/6 -->  [name], MD  [facility] Pager [phone]  If 7PM-7AM, please contact night-coverage [email or web address] Password [email or web address] 03/28/2015, 12:19 PM   LOS: 1 day   HPI/Subjective: No events overnight.   Objective: Filed Vitals:   03/27/15 2237 03/28/15 0553 03/28/15 0603 03/28/15 1055  BP:  134/75    Pulse: 91 90    Temp:  98.5 F (36.9 C)    TempSrc:  Oral    Resp: 22 20    Height:      Weight:   132.9 kg (292 lb 15.9 oz)   SpO2:  96%  94%    Intake/Output Summary (Last 24 hours) at 03/28/15 1219  Last data filed at 03/28/15 0554  Gross per 24 hour  Intake    300 ml  Output    725 ml  Net   -425 ml    Exam:   General:  Pt is alert, follows commands appropriately, not in acute distress  Cardiovascular: Regular rate and rhythm, no rubs, no gallops  Respiratory: Clear to auscultation bilaterally, diminished breath sounds bilaterally with mild exp wheezing  Abdomen: Soft, non tender, non distended, bowel sounds present, no guarding  Extremities: pulses DP and PT palpable bilaterally  Neuro: Grossly nonfocal  Data Reviewed: Basic Metabolic  Panel:  Recent Labs Lab 03/27/15 0652 03/27/15 1028 03/28/15 0428  NA 132*  --  134*  K 2.7*  --  2.8*  CL 88*  --  92*  CO2 32  --  31  GLUCOSE 133*  --  135*  BUN 46*  --  30*  CREATININE 1.57* 1.30* 0.79  CALCIUM 9.1  --  9.2  MG  --   --  2.5*   CBC:  Recent Labs Lab 03/27/15 0652 03/27/15 1028 03/28/15 0428  WBC 16.5* 16.5* 22.9*  NEUTROABS 12.6*  --   --   HGB 12.4 12.9 11.6*  HCT 38.3 39.6 36.5  MCV 93.4 93.6 94.3  PLT 231 230 225   Cardiac Enzymes:  Recent Labs Lab 03/27/15 0652  TROPONINI <0.03    Recent Results (from the past 240 hour(s))  Culture, sputum-assessment     Status: None   Collection Time: 03/27/15 12:50 PM  Result Value Ref Range Status   Specimen Description SPUTUM  Final   Special Requests NONE  Final   Sputum evaluation   Final    THIS SPECIMEN IS ACCEPTABLE. RESPIRATORY CULTURE REPORT TO FOLLOW.   Report Status 03/27/2015 FINAL  Final  Culture, respiratory (NON-Expectorated)     Status: None (Preliminary result)   Collection Time: 03/27/15  1:00 PM  Result Value Ref Range Status   Specimen Description SPUTUM  Final   Special Requests NONE  Final   Gram Stain   Final    ABUNDANT WBC PRESENT, PREDOMINANTLY PMN MODERATE SQUAMOUS EPITHELIAL CELLS PRESENT ABUNDANT GRAM POSITIVE COCCI IN PAIRS IN CHAINS FEW GRAM NEGATIVE RODS THIS SPECIMEN IS ACCEPTABLE FOR SPUTUM CULTURE Performed at [facility]    Culture PENDING  Incomplete   Report Status PENDING  Incomplete     Scheduled Meds: . albuterol  2.5 mg Nebulization BID  . aspirin EC  81 mg Oral Daily  . azithromycin  500 mg Intravenous Q24H  . carvedilol  3.125 mg Oral BID WC  . cefTRIAXone (ROCEPHIN)  IV  1 g Intravenous Q24H  . enoxaparin (LOVENOX) injection  65 mg Subcutaneous Q24H  . furosemide  40 mg Intravenous Q12H  . guaiFENesin  600 mg Oral BID  . iohexol  25 mL Oral Q1 Hr x 2  . isosorbide-hydrALAZINE  1 tablet Oral TID  . potassium chloride SA  40 mEq  Oral TID  . potassium chloride  40 mEq Oral BID  . rosuvastatin  20 mg Oral QHS  . sodium chloride flush  3 mL Intravenous Q12H  . spironolactone  12.5 mg Oral BID   Continuous Infusions:

## 2015-03-28 NOTE — Evaluation (Signed)
Clinical/Bedside Swallow Evaluation Patient Details  Name: SHAHERAH FERRIS MRN: [id number] Date of Birth: [birth year]  Today's Date: 03/28/2015 Time: SLP Start Time (ACUTE ONLY): 77 SLP Stop Time (ACUTE ONLY): 1000 SLP Time Calculation (min) (ACUTE ONLY): 25 min  Past Medical History:  Past Medical History  Diagnosis Date  . Common migraine   . Morbid obesity ([place])   . Hyperlipidemia   . OSA (obstructive sleep apnea)     CPAP dependent  . Carpal tunnel syndrome   . Hypertension   . Allergic rhinitis   . CAD (coronary artery disease)     LHC (11/29/1999):  EF 60%; no significant CAD.  [name] Chronic diastolic CHF (congestive heart failure) ([place])     a. Echo (01/21/11):  Vigorous LVF, EF 65-70%, no RWMA, Gr 2 DD. ;  b.  Echo (12/15): Moderate LVH, EF 123456, grade 2 diastolic dysfunction, mild LAE, normal RV function  . Diverticulitis   . Acute on chronic diastolic CHF (congestive heart failure) ([place]) 04/11/2014  . Sinusitis, chronic 03/10/2012    CT sinuses 02/2012:  Chronic sinusitis with small A/F levels >> rx by ENT with prolonged augmentin F/u ct sinuses 04/2012:  Persistent sinus thickening >> rx with levaquin and clinda for 30 days.  Told to return for sinus scan in 6 weeks >> no showed for followup visit.  CT sinuses 11/2013:  Acute and chronic sinusitis noted.    [name] PNEUMONIA, ORGAN UNSPECIFIED 03/01/2009    Qualifier: Diagnosis of  By: [name] CMA, [name]    . Acute suppurative otitis media without spontaneous rupture of eardrum 02/04/2007    Centricity Description: OTITIS MEDIA, SUPPURATIVE, ACUTE, BILATERAL Qualifier: Diagnosis of  By: [name] MD, [name]  Centricity Description: OTITIS MEDIA, SUPPURATIVE, ACUTE Qualifier: Diagnosis of  By: [name] MD, [name]   . Carpal tunnel syndrome 06/09/2006    Qualifier: Diagnosis of  By: Marca Ancona RMA, Lucy    . IBS (irritable bowel syndrome)   . Diastolic heart failure ([place])   . Hepatic steatosis   . Hiatal hernia   . Hepatomegaly   . Internal  hemorrhoids    Past Surgical History:  Past Surgical History  Procedure Laterality Date  . Tubal ligation    . Tonsillectomy    . Uvulopalatopharyngoplasty    . Cesarean section    . Nasal sinus surgery    . Partial hysterectomy     HPI:  55 yo female adm to [facility] with CHF.  PMH + for sleep apnea- uses CPAP, COPD, oral swelling, CHF, small hiatal hernia.  Pt has reflux issues food dependent -  she reports she takes Tums when symptoms occur - infrequently.      Assessment / Plan / Recommendation Clinical Impression  Pt reports sudden onset of dysphagia Friday associated with throat pain with swallowing.  Pt CN exam unremarkable except sluggish palatal elevation but pt reports h/o uvulectomy surgery.  No indications of airway compromise with intake - nor complaint of residuals with icee or juice.  Pt politely declined to consume solids.  She admits to sensation of pills lodging in throat that require more liquids to clear.  Advised her to consider taking them with food if helpful.  Given negative CN exam and sudden onset of deficits, suspect possible edema and sinus drainage contributing to dysphagia symptoms and except resolution as pt medically improves.  Pt reports cold liquids easier for her to manage/soothing.  Advised pt to continue diet with precautions = consuming what she is able.  Advised her to monitor swallowing and if it does not improve to alert MD.  No followup indicated.      Aspiration Risk  No limitations    Diet Recommendation Regular;Thin liquid   Liquid Administration via: Straw;Cup Medication Administration: Whole meds with liquid Supervision: Patient able to self feed Compensations: Slow rate;Small sips/bites Postural Changes: Seated upright at 90 degrees;Remain upright for at least 30 minutes after po intake    Other  Recommendations Oral Care Recommendations: Oral care BID Other Recommendations: Clarify dietary restrictions   Follow up Recommendations  None     Frequency and Duration            Prognosis        Swallow Study   General Date of Onset: 03/28/15 HPI: 55 yo female adm to [facility] with CHF.  PMH + for sleep apnea- uses CPAP, COPD, oral swelling, CHF, small hiatal hernia.  Pt has reflux issues food dependent -  she reports she takes Tums when symptoms occur - infrequently.    Type of Study: Bedside Swallow Evaluation Diet Prior to this Study: Regular;Thin liquids Temperature Spikes Noted: Yes (low grade) Respiratory Status: Room air History of Recent Intubation: No Behavior/Cognition: Alert;Cooperative;Pleasant mood Oral Cavity Assessment: Within Functional Limits Oral Care Completed by SLP: No Oral Cavity - Dentition: Adequate natural dentition Vision: Functional for self-feeding Self-Feeding Abilities: Able to feed self Patient Positioning: Upright in bed Baseline Vocal Quality: Low vocal intensity Volitional Cough: Strong Volitional Swallow: Able to elicit    Oral/Motor/Sensory Function Overall Oral Motor/Sensory Function: Within functional limits   Ice Chips Ice chips: Not tested   Thin Liquid Thin Liquid: Within functional limits Presentation: Self Fed;Straw    Nectar Thick Nectar Thick Liquid: Not tested   Honey Thick Honey Thick Liquid: Not tested   Puree Puree: Within functional limits Presentation: Self Fed;Spoon Other Comments: strawberry icee   Solid   GO   Solid: Not tested        Luanna Salk, [facility] SLP [phone]

## 2015-03-29 LAB — BASIC METABOLIC PANEL
ANION GAP: 10 (ref 5–15)
BUN: 22 mg/dL — ABNORMAL HIGH (ref 6–20)
CHLORIDE: 95 mmol/L — AB (ref 101–111)
CO2: 31 mmol/L (ref 22–32)
Calcium: 9.1 mg/dL (ref 8.9–10.3)
Creatinine, Ser: 0.81 mg/dL (ref 0.44–1.00)
GFR calc non Af Amer: >60 mL/min (ref >60)
Glucose, Bld: 124 mg/dL — ABNORMAL HIGH (ref 65–99)
POTASSIUM: 3.7 mmol/L (ref 3.5–5.1)
SODIUM: 136 mmol/L (ref 135–145)

## 2015-03-29 LAB — CBC WITH DIFFERENTIAL/PLATELET
Basophils Absolute: 0 10*3/uL (ref 0.0–0.1)
Basophils Relative: 0 %
EOS ABS: 0.4 10*3/uL (ref 0.0–0.7)
Eosinophils Relative: 2 %
HEMATOCRIT: 35.6 % — AB (ref 36.0–46.0)
HEMOGLOBIN: 11.5 g/dL — AB (ref 12.0–15.0)
LYMPHS ABS: 3.9 10*3/uL (ref 0.7–4.0)
LYMPHS PCT: 24 %
MCH: 30.2 pg (ref 26.0–34.0)
MCHC: 32.3 g/dL (ref 30.0–36.0)
MCV: 93.4 fL (ref 78.0–100.0)
Monocytes Absolute: 1.2 10*3/uL — ABNORMAL HIGH (ref 0.1–1.0)
Monocytes Relative: 8 %
NEUTROS ABS: 10.6 10*3/uL — AB (ref 1.7–7.7)
NEUTROS PCT: 66 %
Platelets: 219 10*3/uL (ref 150–400)
RBC: 3.81 MIL/uL — AB (ref 3.87–5.11)
RDW: 13.7 % (ref 11.5–15.5)
WBC: 16.2 10*3/uL — AB (ref 4.0–10.5)

## 2015-03-29 LAB — URINE CULTURE

## 2015-03-29 LAB — INFLUENZA PANEL BY PCR (TYPE A & B)
H1N1 flu by pcr: NOT DETECTED
INFLAPCR: NEGATIVE
Influenza B By PCR: NEGATIVE

## 2015-03-29 NOTE — Progress Notes (Signed)
Patient ID: ARLOA EWALD, female   DOB: 1960-05-26, 55 y.o.   MRN: [id number]  [facility] PROGRESS NOTE  MALLIE BARISH [id number] DOB: Sep 10, 1960 DOA: 03/27/2015 PCP: Bartholome Bill, MD   Brief narrative:    Patient is 55 year old female with known history of diastolic CHF, hypertension, morbid obesity, obstructive sleep apnea on CPAP, presented to [facility] ED with main concern of several days duration of progressively worsening dyspnea, productive cough of white and yellow sputum, subjective chills, difficulty swallowing due to phlegm, weight gain over 10 pounds. Patient denies fevers, no sick contacts or exposures. Reports she was otherwise feeling at her baseline. Currently denies chest pain, no specific urinary or abdominal concerns. She also denies lightheadedness, dizziness, dictations, headaches.  In emergency department, patient noted to be hemodynamically stable, heart rate up to 110, respiratory rate up to 27, blood pressure 165/53. WBCs 16 K, potassium 2.7, creatinine 1.57. Cardiology consulted and [facility] asked to admit for further evaluation.  Assessment/Plan:    1. Dyspnea due to Acute on chronic diastolic CHF, ? Acute COPD with bronchitis, in pt with known OSA/OHS - Per cardiology patient not significantly volume overloaded - pt was wheezing on exam on admission so COPD considered possible etiology  - Echocardiogram has been done but image quality very limited and not all cardiac fields visualized well  - Placed on Lasix 40 mg IV BID, cr is stable and WNL this AM, plan to change to PO Lasix in AM - Weight on admission to 295 pounds --> 292 --> 293 lbs this AM - continue to monitor daily weights, strict intake and output - continue BD's scheduled and as needed - continue Zithromax and Rocephin day #3/5  2. Hypokalemia, severe - continue to supplement as indicated, Mg is WNL  - BMP in the morning  3. Leukocytosis, ? COPD with bronchitis  - Unclear etiology, CT abd  unremarkable, C. Diff negative  - UA and CT chest clear - prelim sputum culture with ? Strep, so possibly contributing - will continue Zithromax and rocephin for now day #3 and will follow up on final sputum analysis   4. Morbid obesity - Body mass index is 52.3 kg/(m^2).  5. Acute kidney injury - Creatinine remains WNL   6. Hyponatremia  - Mild, resolved  7. Essential HTN - continue home medical regimen   8. Dysphagia - resolved   Lovenox SQ for DVT prophylaxis   Code Status: Full.  Family Communication:  plan of care discussed with the patient Disposition Plan: Home by 3/10.   IV access:  Peripheral IV  Procedures and diagnostic studies:    Dg Chest 2 View 03/27/2015  No active cardiopulmonary disease.   Ct Chest Wo Contrast 03/27/2015   No acute cardiopulmonary disease  Medical Consultants:  Cardiology - signed off   Other Consultants:  PT SLP  IAnti-Infectives:   Zithromax 3/6 --> Rocephin 3/6 -->  [name], MD  [facility] Pager [phone]  If 7PM-7AM, please contact night-coverage [email or web address] Password [email or web address] 03/29/2015, 10:07 AM   LOS: 2 days   HPI/Subjective: No events overnight. Reports nausea this AM.  Objective: Filed Vitals:   03/28/15 2247 03/29/15 0313 03/29/15 0655 03/29/15 1002  BP:   128/73   Pulse: 87  81   Temp:   97.5 F (36.4 C)   TempSrc:   Oral   Resp: 20  20   Height:      Weight:  133.3 kg (293 lb 14 oz)    SpO2:  97%  98% 99%    Intake/Output Summary (Last 24 hours) at 03/29/15 1007 Last data filed at 03/29/15 0310  Gross per 24 hour  Intake    540 ml  Output   1160 ml  Net   -620 ml    Exam:   General:  Pt is alert, follows commands appropriately, not in acute distress  Cardiovascular: Regular rate and rhythm, no rubs, no gallops  Respiratory: Clear to auscultation bilaterally, diminished breath sounds bilaterally, no wheezing   Abdomen: Soft, non tender, non distended, bowel sounds present, no  guarding  Extremities: pulses DP and PT palpable bilaterally  Neuro: Grossly nonfocal  Data Reviewed: Basic Metabolic Panel:  Recent Labs Lab 03/27/15 0652 03/27/15 1028 03/28/15 0428 03/28/15 1155 03/29/15 0453  NA 132*  --  134* 136 136  K 2.7*  --  2.8* 2.9* 3.7  CL 88*  --  92* 89* 95*  CO2 32  --  31 34* 31  GLUCOSE 133*  --  135* 144* 124*  BUN 46*  --  30* 26* 22*  CREATININE 1.57* 1.30* 0.79 0.77 0.81  CALCIUM 9.1  --  9.2 9.5 9.1  MG  --   --  2.5*  --   --    CBC:  Recent Labs Lab 03/27/15 0652 03/27/15 1028 03/28/15 0428 03/29/15 0453  WBC 16.5* 16.5* 22.9* 16.2*  NEUTROABS 12.6*  --   --  10.6*  HGB 12.4 12.9 11.6* 11.5*  HCT 38.3 39.6 36.5 35.6*  MCV 93.4 93.6 94.3 93.4  PLT 231 230 225 219   Cardiac Enzymes:  Recent Labs Lab 03/27/15 0652  TROPONINI <0.03    Recent Results (from the past 240 hour(s))  Culture, sputum-assessment     Status: None   Collection Time: 03/27/15 12:50 PM  Result Value Ref Range Status   Specimen Description SPUTUM  Final   Special Requests NONE  Final   Sputum evaluation   Final    THIS SPECIMEN IS ACCEPTABLE. RESPIRATORY CULTURE REPORT TO FOLLOW.   Report Status 03/27/2015 FINAL  Final  Culture, respiratory (NON-Expectorated)     Status: None (Preliminary result)   Collection Time: 03/27/15  1:00 PM  Result Value Ref Range Status   Specimen Description SPUTUM  Final   Special Requests NONE  Final   Gram Stain   Final    ABUNDANT WBC PRESENT, PREDOMINANTLY PMN MODERATE SQUAMOUS EPITHELIAL CELLS PRESENT ABUNDANT GRAM POSITIVE COCCI IN PAIRS IN CHAINS FEW GRAM NEGATIVE RODS THIS SPECIMEN IS ACCEPTABLE FOR SPUTUM CULTURE Performed at [facility]    Culture   Final    ABUNDANT GROUP A STREP (S.PYOGENES) ISOLATED Note: Beta hemolytic streptococci are predictably susceptible to penicillin and other beta lactams. Susceptibility testing not routinely performed. Performed at [facility]     Report Status PENDING  Incomplete  C difficile quick scan w PCR reflex     Status: None   Collection Time: 03/28/15  8:45 PM  Result Value Ref Range Status   C Diff antigen NEGATIVE NEGATIVE Final   C Diff toxin NEGATIVE NEGATIVE Final   C Diff interpretation Negative for toxigenic C. difficile  Final     Scheduled Meds: . albuterol  2.5 mg Nebulization BID  . aspirin EC  81 mg Oral Daily  . azithromycin  500 mg Intravenous Q24H  . carvedilol  3.125 mg Oral BID WC  . cefTRIAXone (ROCEPHIN)  IV  1 g Intravenous Q24H  . enoxaparin (LOVENOX) injection  65 mg Subcutaneous Q24H  . furosemide  40 mg Intravenous Q12H  . guaiFENesin  600 mg Oral BID  . isosorbide-hydrALAZINE  1 tablet Oral TID  . potassium chloride SA  40 mEq Oral TID  . rosuvastatin  20 mg Oral QHS  . sodium chloride flush  3 mL Intravenous Q12H  . spironolactone  12.5 mg Oral BID   Continuous Infusions:

## 2015-03-29 NOTE — Evaluation (Signed)
Physical Therapy One Time Evaluation Patient Details Name: [name] MRN: [id number] DOB: Mar 19, 1960 Today's Date: 03/29/2015   History of Present Illness  55 year old female with known history of diastolic CHF, hypertension, morbid obesity, obstructive sleep apnea on CPAP admitted 03/27/15 with dyspnea due to acute on chronic diastolic CHF  Clinical Impression  Patient evaluated by Physical Therapy with no further acute PT needs identified. All education has been completed and the patient has no further questions.  See below for any follow-up Physical Therapy or equipment needs. PT is signing off. Thank you for this referral.     Follow Up Recommendations No PT follow up    Equipment Recommendations  None recommended by PT    Recommendations for Other Services       Precautions / Restrictions Precautions Precautions: None      Mobility  Bed Mobility Overal bed mobility: Modified Independent                Transfers Overall transfer level: Needs assistance Equipment used: None Transfers: Sit to/from Stand Sit to Stand: Supervision            Ambulation/Gait Ambulation/Gait assistance: Supervision Ambulation Distance (Feet): 160 Feet Assistive device: None Gait Pattern/deviations: Step-through pattern;Wide base of support     General Gait Details: slow but steady gait, 2 rest breaks standing due to fatigue, SpO2 briefly dropped to 85% on room air however quickly increased back to 90s (mostly remained 95% or above room air)  Stairs            Wheelchair Mobility    Modified Rankin (Stroke Patients Only)       Balance                                             Pertinent Vitals/Pain Pain Assessment: No/denies pain    Home Living Family/patient expects to be discharged to:: Private residence Living Arrangements: Children Available Help at Discharge: Family;Available PRN/intermittently (alone during the day) Type of  Home: House Home Access: Stairs to enter   [employer] of Steps: a couple Home Layout: One level Home Equipment: None      Prior Function Level of Independence: Independent               Hand Dominance        Extremity/Trunk Assessment               Lower Extremity Assessment: Generalized weakness         Communication   Communication: No difficulties  Cognition Arousal/Alertness: Awake/alert Behavior During Therapy: WFL for tasks assessed/performed Overall Cognitive Status: Within Functional Limits for tasks assessed                      General Comments      Exercises        Assessment/Plan    PT Assessment Patent does not need any further PT services  PT Diagnosis Difficulty walking   PT Problem List    PT Treatment Interventions     PT Goals (Current goals can be found in the Care Plan section) Acute Rehab PT Goals PT Goal Formulation: All assessment and education complete, DC therapy    Frequency     Barriers to discharge        Co-evaluation  End of Session   Activity Tolerance: Patient tolerated treatment well Patient left: in bed;with call bell/phone within reach;with bed alarm set           Time: [id number] PT Time Calculation (min) (ACUTE ONLY): 18 min   Charges:   PT Evaluation $PT Eval Low Complexity: 1 Procedure     PT G Codes:        [name],KATHrine E 03/29/2015, 3:37 PM Carmelia Bake, PT, DPT 03/29/2015 Pager: [phone]

## 2015-03-29 NOTE — Progress Notes (Signed)
Pt is NIV at this time tolerating it well.  

## 2015-03-30 DIAGNOSIS — R0600 Dyspnea, unspecified: Secondary | ICD-10-CM | POA: Insufficient documentation

## 2015-03-30 DIAGNOSIS — R0609 Other forms of dyspnea: Secondary | ICD-10-CM | POA: Insufficient documentation

## 2015-03-30 DIAGNOSIS — D72829 Elevated white blood cell count, unspecified: Secondary | ICD-10-CM

## 2015-03-30 DIAGNOSIS — E876 Hypokalemia: Secondary | ICD-10-CM

## 2015-03-30 LAB — CBC
HEMATOCRIT: 35.8 % — AB (ref 36.0–46.0)
HEMOGLOBIN: 11.3 g/dL — AB (ref 12.0–15.0)
MCH: 29.9 pg (ref 26.0–34.0)
MCHC: 31.6 g/dL (ref 30.0–36.0)
MCV: 94.7 fL (ref 78.0–100.0)
Platelets: 238 10*3/uL (ref 150–400)
RBC: 3.78 MIL/uL — AB (ref 3.87–5.11)
RDW: 13.7 % (ref 11.5–15.5)
WBC: 11.7 10*3/uL — AB (ref 4.0–10.5)

## 2015-03-30 LAB — CULTURE, RESPIRATORY W GRAM STAIN

## 2015-03-30 LAB — CULTURE, RESPIRATORY

## 2015-03-30 LAB — BASIC METABOLIC PANEL
ANION GAP: 11 (ref 5–15)
BUN: 24 mg/dL — ABNORMAL HIGH (ref 6–20)
CHLORIDE: 98 mmol/L — AB (ref 101–111)
CO2: 30 mmol/L (ref 22–32)
Calcium: 9.4 mg/dL (ref 8.9–10.3)
Creatinine, Ser: 0.8 mg/dL (ref 0.44–1.00)
GFR calc non Af Amer: >60 mL/min (ref >60)
Glucose, Bld: 119 mg/dL — ABNORMAL HIGH (ref 65–99)
Potassium: 3.9 mmol/L (ref 3.5–5.1)
Sodium: 139 mmol/L (ref 135–145)

## 2015-03-30 LAB — GLUCOSE, CAPILLARY: Glucose-Capillary: 170 mg/dL — ABNORMAL HIGH (ref 65–99)

## 2015-03-30 MED ORDER — GI COCKTAIL ~~LOC~~
30.0000 mL | Freq: Once | ORAL | Status: AC
  Administered 2015-03-30: 30 mL via ORAL
  Filled 2015-03-30: qty 30

## 2015-03-30 MED ORDER — FUROSEMIDE 40 MG PO TABS
80.0000 mg | ORAL_TABLET | Freq: Two times a day (BID) | ORAL | Status: DC
Start: 2015-03-30 — End: 2015-03-31
  Administered 2015-03-30 – 2015-03-31 (×3): 80 mg via ORAL
  Filled 2015-03-30 (×3): qty 2

## 2015-03-30 MED ORDER — FAMOTIDINE 20 MG PO TABS
20.0000 mg | ORAL_TABLET | Freq: Two times a day (BID) | ORAL | Status: DC
  Administered 2015-03-30 (×2): 20 mg via ORAL
  Filled 2015-03-30 (×2): qty 1

## 2015-03-30 MED ORDER — FAMOTIDINE 20 MG PO TABS
20.0000 mg | ORAL_TABLET | Freq: Once | ORAL | Status: AC
  Administered 2015-03-30: 20 mg via ORAL
  Filled 2015-03-30: qty 1

## 2015-03-30 MED ORDER — ALUM & MAG HYDROXIDE-SIMETH 200-200-20 MG/5ML PO SUSP
30.0000 mL | ORAL | Status: DC | PRN
  Filled 2015-03-30: qty 30

## 2015-03-30 MED ORDER — AZITHROMYCIN 250 MG PO TABS
500.0000 mg | ORAL_TABLET | ORAL | Status: DC
  Administered 2015-03-30: 500 mg via ORAL
  Filled 2015-03-30: qty 2

## 2015-03-30 NOTE — Progress Notes (Signed)
Patient ID: ELAIYA NOSAL, female DOB: 13-Sep-1960, 55 y.o. MRN: [id number]  [facility] PROGRESS NOTE  ADAMARY BAYNE [id number] DOB: February 14, 1960 DOA: 03/27/2015 PCP: Bartholome Bill, MD   Brief narrative:    Patient is 55 year old female with known history of diastolic CHF, hypertension, morbid obesity, obstructive sleep apnea on CPAP, presented to [facility] ED with main concern of several days duration of progressively worsening dyspnea, productive cough of white and yellow sputum, subjective chills, difficulty swallowing due to phlegm, weight gain over 10 pounds. Patient denies fevers, no sick contacts or exposures. Reports she was otherwise feeling at her baseline. Currently denies chest pain, no specific urinary or abdominal concerns. She also denies lightheadedness, dizziness, dictations, headaches.  In emergency department, patient noted to be hemodynamically stable, heart rate up to 110, respiratory rate up to 27, blood pressure 165/53. WBCs 16 K, potassium 2.7, creatinine 1.57. Cardiology consulted and TR[facility] asked to admit for further evaluation.  Assessment/Plan:    1. Dyspnea due to Acute on chronic diastolic CHF, ? Acute COPD with bronchitis, in pt with known OSA/OHS - Per cardiology patient not significantly volume overloaded  - pt was wheezing on exam on admission so COPD considered possible etiology  - Echocardiogram has been done but image quality very limited and not all cardiac fields visualized well  - Placed on Lasix 40 mg IV BID, cr is stable and WNL this AM, Lasix converted back to PO - Weight on admission to 295 pounds --> 292 --> 293 lbs this AM, stable - continue to monitor daily weights, strict intake and output; net neg 2855 cc - continue BD's scheduled and as needed - continue Zithromax and Rocephin day #4/5  2. Hypokalemia, severe - continue to supplement as indicated, Mg is WNL  - BMP in the morning  3. Leukocytosis, ? COPD with bronchitis   - Unclear etiology, CT abd unremarkable, C. Diff negative  - UA and CT chest clear - prelim sputum culture with ? Strep, so possibly contributing - will continue Zithromax and rocephin for now day #4 and will follow up on final sputum analysis   4. Morbid obesity - Body mass index is 52.3 kg/(m^2).  5. Acute kidney injury - Creatinine remains WNL   6. Hyponatremia  - Mild, resolved  7. Essential HTN - continue home medical regimen   8. Dysphagia - resolved   Lovenox SQ for DVT prophylaxis   Code Status: Full.  Family Communication: plan of care discussed with the patient Disposition Plan: Home 3/10.   IV access:  Peripheral IV  Procedures and diagnostic studies:   Dg Chest 2 View 03/27/2015 No active cardiopulmonary disease.   Ct Chest Wo Contrast 03/27/2015 No acute cardiopulmonary disease  Medical Consultants:  Cardiology - signed off   Other Consultants:  PT SLP  IAnti-Infectives:   Zithromax 3/6 --> Rocephin 3/6 -->   HPI/Subjective: No events overnight. Reports heartburn this AM.  Objective: Filed Vitals:   03/28/15 2247 03/29/15 0313 03/29/15 0655 03/29/15 1002  BP:   128/73   Pulse: 87  81   Temp:   97.5 F (36.4 C)   TempSrc:   Oral   Resp: 20  20   Height:      Weight:  133.3 kg (293 lb 14 oz)    SpO2: 97%  98% 99%    Intake/Output Summary (Last 24 hours) at 03/29/15 1007 Last data filed at 03/29/15 0310  Gross per 24 hour  Intake  540 ml  Output  1160 ml  Net  -620 ml    Exam:   General: Pt is alert, follows commands appropriately, not in acute distress  Cardiovascular: Regular rate and rhythm, no rubs, no gallops  Respiratory: Clear to auscultation bilaterally, diminished breath sounds bilaterally, no wheezing   Abdomen: Soft, non tender, non distended, bowel sounds present, no guarding  Extremities: pulses DP and PT palpable  bilaterally  Neuro: Grossly nonfocal  Data Reviewed: Basic Metabolic Panel:  Last Labs      Recent Labs Lab 03/27/15 0652 03/27/15 1028 03/28/15 0428 03/28/15 1155 03/29/15 0453  NA 132* --  134* 136 136  K 2.7* --  2.8* 2.9* 3.7  CL 88* --  92* 89* 95*  CO2 32 --  31 34* 31  GLUCOSE 133* --  135* 144* 124*  BUN 46* --  30* 26* 22*  CREATININE 1.57* 1.30* 0.79 0.77 0.81  CALCIUM 9.1 --  9.2 9.5 9.1  MG --  --  2.5* --  --      CBC:  Last Labs      Recent Labs Lab 03/27/15 0652 03/27/15 1028 03/28/15 0428 03/29/15 0453  WBC 16.5* 16.5* 22.9* 16.2*  NEUTROABS 12.6* --  --  10.6*  HGB 12.4 12.9 11.6* 11.5*  HCT 38.3 39.6 36.5 35.6*  MCV 93.4 93.6 94.3 93.4  PLT 231 230 225 219     Cardiac Enzymes:  Last Labs      Recent Labs Lab 03/27/15 0652  TROPONINI <0.03      Recent Results (from the past 240 hour(s))  Culture, sputum-assessment Status: None   Collection Time: 03/27/15 12:50 PM  Result Value Ref Range Status   Specimen Description SPUTUM  Final   Special Requests NONE  Final   Sputum evaluation   Final    THIS SPECIMEN IS ACCEPTABLE. RESPIRATORY CULTURE REPORT TO FOLLOW.   Report Status 03/27/2015 FINAL  Final  Culture, respiratory (NON-Expectorated) Status: None (Preliminary result)   Collection Time: 03/27/15 1:00 PM  Result Value Ref Range Status   Specimen Description SPUTUM  Final   Special Requests NONE  Final   Gram Stain   Final    ABUNDANT WBC PRESENT, PREDOMINANTLY PMN MODERATE SQUAMOUS EPITHELIAL CELLS PRESENT ABUNDANT GRAM POSITIVE COCCI IN PAIRS IN CHAINS FEW GRAM NEGATIVE RODS THIS SPECIMEN IS ACCEPTABLE FOR SPUTUM CULTURE Performed at [facility]    Culture   Final    ABUNDANT GROUP A STREP (S.PYOGENES) ISOLATED Note: Beta hemolytic  streptococci are predictably susceptible to penicillin and other beta lactams. Susceptibility testing not routinely performed. Performed at [facility]    Report Status PENDING  Incomplete  C difficile quick scan w PCR reflex Status: None   Collection Time: 03/28/15 8:45 PM  Result Value Ref Range Status   C Diff antigen NEGATIVE NEGATIVE Final   C Diff toxin NEGATIVE NEGATIVE Final   C Diff interpretation Negative for toxigenic C. difficile  Final     Scheduled Meds: . albuterol 2.5 mg Nebulization BID  . aspirin EC 81 mg Oral Daily  . azithromycin 500 mg Intravenous Q24H  . carvedilol 3.125 mg Oral BID WC  . cefTRIAXone (ROCEPHIN) IV 1 g Intravenous Q24H  . enoxaparin (LOVENOX) injection 65 mg Subcutaneous Q24H  . furosemide 40 mg Intravenous Q12H  . guaiFENesin 600 mg Oral BID  . isosorbide-hydrALAZINE 1 tablet Oral TID  . potassium chloride SA 40 mEq Oral TID  . rosuvastatin 20 mg Oral QHS  .  sodium chloride flush 3 mL Intravenous Q12H  . spironolactone 12.5 mg Oral BID   Continuous Infusions:

## 2015-03-30 NOTE — Progress Notes (Signed)
PHARMACIST - PHYSICIAN COMMUNICATION DR:   Opyd CONCERNING: Antibiotic IV to Oral Route Change Policy  RECOMMENDATION: This patient is receiving Azithromycin by the intravenous route.  Based on criteria approved by the Pharmacy and Therapeutics Committee, the antibiotic(s) is/are being converted to the equivalent oral dose form(s).   DESCRIPTION: These criteria include:  Patient being treated for a respiratory tract infection, urinary tract infection, cellulitis or clostridium difficile associated diarrhea if on metronidazole  The patient is not neutropenic and does not exhibit a GI malabsorption state  The patient is eating (either orally or via tube) and/or has been taking other orally administered medications for a least 24 hours  The patient is improving clinically and has a Tmax < 100.5  If you have questions about this conversion, please contact the Pharmacy Department  []   [phone] )  [facility] PharmD, [place] Pager [phone] 03/30/2015 1:32 PM

## 2015-03-31 ENCOUNTER — Telehealth: Payer: Self-pay | Admitting: Cardiology

## 2015-03-31 ENCOUNTER — Other Ambulatory Visit: Payer: Self-pay | Admitting: Internal Medicine

## 2015-03-31 ENCOUNTER — Telehealth: Payer: Self-pay | Admitting: Internal Medicine

## 2015-03-31 DIAGNOSIS — R0600 Dyspnea, unspecified: Secondary | ICD-10-CM

## 2015-03-31 DIAGNOSIS — G4733 Obstructive sleep apnea (adult) (pediatric): Secondary | ICD-10-CM

## 2015-03-31 DIAGNOSIS — E876 Hypokalemia: Secondary | ICD-10-CM

## 2015-03-31 DIAGNOSIS — E871 Hypo-osmolality and hyponatremia: Secondary | ICD-10-CM

## 2015-03-31 DIAGNOSIS — N179 Acute kidney failure, unspecified: Secondary | ICD-10-CM

## 2015-03-31 DIAGNOSIS — J441 Chronic obstructive pulmonary disease with (acute) exacerbation: Secondary | ICD-10-CM

## 2015-03-31 MED ORDER — ALBUTEROL SULFATE (2.5 MG/3ML) 0.083% IN NEBU: 2.5000 mg | INHALATION_SOLUTION | RESPIRATORY_TRACT | Status: DC | PRN

## 2015-03-31 MED ORDER — POTASSIUM CHLORIDE CRYS ER 20 MEQ PO TBCR: EXTENDED_RELEASE_TABLET | ORAL | Status: DC

## 2015-03-31 MED ORDER — CEFUROXIME AXETIL 500 MG PO TABS: 500.0000 mg | ORAL_TABLET | Freq: Two times a day (BID) | ORAL | Status: DC

## 2015-03-31 MED ORDER — TRAMADOL HCL 50 MG PO TABS: 50.0000 mg | ORAL_TABLET | Freq: Four times a day (QID) | ORAL | Status: DC | PRN

## 2015-03-31 NOTE — Telephone Encounter (Signed)
Follow Up  Pt called back to discuss potassium.

## 2015-03-31 NOTE — Telephone Encounter (Signed)
Spoke with pt, questions regarding potassium and lab work answered.

## 2015-03-31 NOTE — Telephone Encounter (Signed)
Pt missed appt last week due to being in the hospital. Appt rescheduled to Tuesday at 9:15am. Pt aware of appt.

## 2015-03-31 NOTE — Discharge Summary (Addendum)
Physician Discharge Summary  [name] LPF:[id number] DOB: 07-Nov-1960 DOA: 03/27/2015  PCP: Bartholome Bill, MD  Admit date: 03/27/2015 Discharge date: 04/11/2015  Time spent: 35 minutes  Recommendations for Outpatient Follow-up:  1. Please follow-up on volume status, she was discharged on diuretics 2. Please follow up on BMP on hospital follow-up visit 3. During this hospitalization he was treated for community-acquired pneumonia/COPD   Discharge Diagnoses:  Principal Problem:   Acute on chronic diastolic CHF (congestive heart failure), NYHA class 2 (HCC) Active Problems:   Hyperlipidemia   Obstructive sleep apnea   Essential hypertension   Sinusitis, chronic   Morbid obesity due to excess calories (HCC)   Dysphagia   Hyponatremia   Acute kidney injury (HCC)   Hypokalemia   Leukocytosis   COPD exacerbation (HCC)   Dyspnea   Bronchitis, chronic obstructive w acute bronchitis ([place])   Discharge Condition: Stable  Diet recommendation: Heart healthy  Filed Weights   03/29/15 0313 03/30/15 0614 03/31/15 0549  Weight: 133.3 kg (293 lb 14 oz) 133.131 kg (293 lb 8 oz) 134.265 kg (296 lb)    History of present illness:  Patient is 55 year old female with known history of diastolic CHF, hypertension, morbid obesity, obstructive sleep apnea on CPAP, presented to [facility] ED with main concern of several days duration of progressively worsening dyspnea, productive cough of white and yellow sputum, subjective chills, difficulty swallowing due to phlegm, weight gain over 10 pounds. Patient denies fevers, no sick contacts or exposures. Reports she was otherwise feeling at her baseline. Currently denies chest pain, no specific urinary or abdominal concerns. She also denies lightheadedness, dizziness, dictations, headaches.  In emergency department, patient noted to be hemodynamically stable, heart rate up to 110, respiratory rate up to 27, blood pressure 165/53. WBCs 16 K, potassium 2.7,  creatinine 1.57. Cardiology consulted and [facility] asked to admit for further evaluation.   Hospital Course:  1. Dyspnea due to Acute on chronic diastolic CHF, ? Acute COPD with bronchitis, in pt with known OSA/OHS - Per cardiology patient not significantly volume overloaded  - pt was wheezing on exam on admission so COPD considered possible etiology  - Echocardiogram has been done but image quality very limited and not all cardiac fields visualized well  - Placed initially on Lasix 40 mg IV BID, cr is stable and converted to her home diuretic regimen during this hospitalization - On day of discharge he had a net negative fluid balance of 3.8 L - Renal function remains stable, on 03/30/2015 had a creatinine of 0.8 and BUN of 24  2. Hypokalemia, severe - continue to supplement as indicated, Mg is WNL  - BMP in the morning  3. Leukocytosis, ? COPD with bronchitis  - Unclear etiology, CT abd unremarkable, C. Diff negative  - UA and CT chest clear -During this hospitalization she was treated with IV azithromycin and ceftriaxone -She showed clinical improvement, by 03/30/2015 her white count had come down to 11,700 from 22,900 on 03/28/2015.  4. Morbid obesity - Body mass index is 52.3 kg/(m^2).  5. Acute kidney injury - Creatinine remains WNL   6. Hyponatremia  - Mild, resolved  7. Essential HTN - continue home medical regimen   Procedures:  Transthoracic echocardiogram Impression: - Extremely poor acoustic windows limit study.  With echocontrast LVEF and RVEF appear to be normal. Cannot fully  evaluate all regional wall segments.  Transthoracic echocardiography. M-mode, complete 2D, spectral Doppler, and color Doppler. Birthdate: Patient birthdate: 05/10/1960. Age: Patient is  55 yr old. Sex: Gender: female. BMI: 52.9 kg/m^2. Blood pressure: 119/70 Patient status: Inpatient. Study date: Study date: 03/27/2015. Study time: 01:59 PM. Location:  Bedside.  Consultations:  Cardiology  Discharge Exam: Filed Vitals:   03/30/15 2242 03/31/15 0549  BP: 138/70 137/86  Pulse: 88 84  Temp: 98.4 F (36.9 C) 98.2 F (36.8 C)  Resp: 20 20     General: Pt is alert, follows commands appropriately, not in acute distress  Cardiovascular: Regular rate and rhythm, no rubs, no gallops  Respiratory: Clear to auscultation bilaterally, diminished breath sounds bilaterally, no wheezing   Abdomen: Soft, non tender, non distended, bowel sounds present, no guarding  Extremities: pulses DP and PT palpable bilaterally  Neuro: Grossly nonfocal  Discharge Instructions   Discharge Instructions    Call MD for:  difficulty breathing, headache or visual disturbances    Complete by:  As directed      Call MD for:  extreme fatigue    Complete by:  As directed      Call MD for:  hives    Complete by:  As directed      Call MD for:  persistant dizziness or light-headedness    Complete by:  As directed      Call MD for:  persistant nausea and vomiting    Complete by:  As directed      Call MD for:  redness, tenderness, or signs of infection (pain, swelling, redness, odor or green/yellow discharge around incision site)    Complete by:  As directed      Call MD for:  severe uncontrolled pain    Complete by:  As directed      Call MD for:  temperature >100.4    Complete by:  As directed      Call MD for:    Complete by:  As directed      Diet - low sodium heart healthy    Complete by:  As directed      Increase activity slowly    Complete by:  As directed           Discharge Medication List as of 03/31/2015  9:07 AM    START taking these medications   Details  cefUROXime (CEFTIN) 500 MG tablet Take 1 tablet (500 mg total) by mouth 2 (two) times daily with a meal., Starting 03/31/2015, Until Discontinued, Print    traMADol (ULTRAM) 50 MG tablet Take 1 tablet (50 mg total) by mouth every 6 (six) hours as needed for moderate pain.,  Starting 03/31/2015, Until Discontinued, Print      CONTINUE these medications which have NOT CHANGED   Details  aspirin EC 81 MG EC tablet Take 1 tablet (81 mg total) by mouth daily., Starting 02/23/2015, Until Discontinued, Print    dextromethorphan-guaiFENesin (MUCINEX DM) 30-600 MG 12hr tablet Take 1 tablet by mouth every 12 (twelve) hours., Until Discontinued, Historical Med    fluticasone (FLONASE) 50 MCG/ACT nasal spray Place 2 sprays into both nostrils 2 (two) times daily as needed for allergies or rhinitis., Starting 02/23/2015, Until Discontinued, Print    furosemide (LASIX) 40 MG tablet Take 2 tablets (80 mg total) by mouth 2 (two) times daily. For swelling or fluid retention., Starting 02/23/2015, Until Discontinued, Print    isosorbide-hydrALAZINE (BIDIL) 20-37.5 MG tablet Take 1 tablet by mouth 3 (three) times daily., Starting 02/23/2015, Until Discontinued, Print    losartan (COZAAR) 100 MG tablet TAKE 1 TABLET (100 MG TOTAL) BY MOUTH DAILY.,  Normal    metolazone (ZAROXOLYN) 2.5 MG tablet Take 1 tablet (2.5 mg total) by mouth daily as needed. see notes, Starting 11/29/2014, Until Discontinued, Normal    rosuvastatin (CRESTOR) 20 MG tablet Take 1 tablet (20 mg total) by mouth at bedtime., Starting 02/23/2015, Until Discontinued, Print    spironolactone (ALDACTONE) 25 MG tablet Take 0.5 tablets (12.5 mg total) by mouth 2 (two) times daily., Starting 02/23/2015, Until Discontinued, Print    topiramate (TOPAMAX) 25 MG tablet Take 25 mg by mouth 2 (two) times daily as needed (for headaches). , Starting 03/03/2015, Until Discontinued, Historical Med    albuterol (PROVENTIL HFA;VENTOLIN HFA) 108 (90 BASE) MCG/ACT inhaler Inhale 1-2 puffs into the lungs every 6 (six) hours as needed for wheezing or shortness of breath., Starting 03/20/2014, Until Discontinued, Print    omega-3 acid ethyl esters (LOVAZA) 1 g capsule Take 1 capsule (1 g total) by mouth 2 (two) times daily., Starting 02/23/2015, Until  Discontinued, Print    potassium chloride SA (KLOR-CON M20) 20 MEQ tablet Take 2 tablets (40 mEq total) by mouth 3 (three) times daily., Starting 02/23/2015, Until Discontinued, Print      STOP taking these medications     bisacodyl (DULCOLAX) 5 MG EC tablet      Blood Pressure Monitoring (ADULT BLOOD PRESSURE CUFF LG) KIT        Allergies  Allergen Reactions  . Sulfonamide Derivatives Anaphylaxis, Swelling and Rash    Swelling tongue and ear   . Doxycycline Nausea And Vomiting  . Latex Hives  . Ciprofloxacin Rash  . Hydrocortisone Rash  . Neomycin Rash   Follow-up Information    Follow up with Scarlette Shorts, MD In 4 days.   Specialty:  Gastroenterology   Contact information:   [address] [place] [address] [phone]       Follow up with Bartholome Bill, MD In 1 week.   Specialty:  Family Medicine   Contact information:   [address] [place] [address] [phone]        The results of significant diagnostics from this hospitalization (including imaging, microbiology, ancillary and laboratory) are listed below for reference.    Significant Diagnostic Studies: Dg Chest 2 View  03/27/2015  CLINICAL DATA:  Worsening cough, congestion, shortness of breath, and low grade fever since Friday. EXAM: CHEST  2 VIEW COMPARISON:  02/19/2015 FINDINGS: Normal heart size and pulmonary vascularity. No focal airspace disease or consolidation in the lungs. No blunting of costophrenic angles. No pneumothorax. Mediastinal contours appear intact. Degenerative changes in the spine and shoulders. Tortuous aorta. IMPRESSION: No active cardiopulmonary disease. Electronically Signed   By: Lucienne Capers M.D.   On: 03/27/2015 06:34   Ct Chest Wo Contrast  03/27/2015  CLINICAL DATA:  Shortness of breath with productive cough. Oral swelling. EXAM: CT CHEST WITHOUT CONTRAST TECHNIQUE: Multidetector CT imaging of the chest was performed following the standard protocol without  IV contrast. COMPARISON:  12/31/2013 FINDINGS: The central airways are patent. The lungs are clear. There is no pleural effusion or pneumothorax. There are no pathologically enlarged axillary, hilar or mediastinal lymph nodes. The heart size is normal. There is no pericardial effusion. The thoracic aorta is normal in caliber. Review of bone windows demonstrates no focal lytic or sclerotic lesions. Limited non-contrast images of the upper abdomen were obtained. The adrenal glands appear normal. There is a small hiatal hernia. IMPRESSION: No acute cardiopulmonary disease for his Electronically Signed   By: Elbert Ewings  Patel   On: 03/27/2015 09:37   Ct Abdomen Pelvis W Contrast  03/28/2015  CLINICAL DATA:  Leukocytosis, fever, chills. Blood in the stool starting 2 weeks ago. EXAM: CT ABDOMEN AND PELVIS WITH CONTRAST TECHNIQUE: Multidetector CT imaging of the abdomen and pelvis was performed using the standard protocol following bolus administration of intravenous contrast. CONTRAST:  16m OMNIPAQUE IOHEXOL 300 MG/ML  SOLN COMPARISON:  February 11, 2013 FINDINGS: Lower chest:  No acute findings. Hepatobiliary: No masses or other significant abnormality. The gallbladder is normal. There is mild diffuse low density of the liver. Pancreas: No mass, inflammatory changes, or other significant abnormality. Spleen: Within normal limits in size and appearance. Adrenals/Urinary Tract: No masses identified. No evidence of hydronephrosis. The bladder is normal. Stomach/Bowel: No evidence of obstruction, inflammatory process, or abnormal fluid collections. The appendix is not seen but no inflammation is noted around the cecum to suggest appendicitis. Vascular/Lymphatic: No pathologically enlarged lymph nodes. No evidence of abdominal aortic aneurysm. Reproductive: No mass or other significant abnormality. Patient status post prior hysterectomy. Other: Right gonadal vein calcifications is identified on image 45 unchanged compared to  prior CT. There is umbilical herniation of mesenteric fat. Musculoskeletal:  No acute abnormality is identified in the bones. IMPRESSION: No acute abnormality identified in the abdomen and pelvis. Fatty infiltration of liver. Electronically Signed   By: WAbelardo DieselM.D.   On: 03/28/2015 16:40    Microbiology: No results found for this or any previous visit (from the past 240 hour(s)).   Labs: Basic Metabolic Panel: No results for input(s): NA, K, CL, CO2, GLUCOSE, BUN, CREATININE, CALCIUM, MG, PHOS in the last 168 hours. Liver Function Tests: No results for input(s): AST, ALT, ALKPHOS, BILITOT, PROT, ALBUMIN in the last 168 hours. No results for input(s): LIPASE, AMYLASE in the last 168 hours. No results for input(s): AMMONIA in the last 168 hours. CBC: No results for input(s): WBC, NEUTROABS, HGB, HCT, MCV, PLT in the last 168 hours. Cardiac Enzymes: No results for input(s): CKTOTAL, CKMB, CKMBINDEX, TROPONINI in the last 168 hours. BNP: BNP (last 3 results)  Recent Labs  02/19/15 0910 03/27/15 0652  BNP 52.0 20.0    ProBNP (last 3 results) No results for input(s): PROBNP in the last 8760 hours.  CBG: No results for input(s): GLUCAP in the last 168 hours.     Signed:  ZKelvin CellarMD.  [facility] 04/11/2015, 4:01 PM

## 2015-03-31 NOTE — Telephone Encounter (Signed)
Left message for pt to call back  °

## 2015-03-31 NOTE — Telephone Encounter (Signed)
Pt was just discharged from the hospital today, She was told to call here,her potassium is low and they increased her potassium medicine.Please call to advise.

## 2015-04-04 ENCOUNTER — Encounter: Payer: Self-pay | Admitting: Internal Medicine

## 2015-04-04 ENCOUNTER — Inpatient Hospital Stay: Payer: No Typology Code available for payment source | Admitting: Adult Health

## 2015-04-04 ENCOUNTER — Ambulatory Visit (INDEPENDENT_AMBULATORY_CARE_PROVIDER_SITE_OTHER): Payer: No Typology Code available for payment source | Admitting: Pulmonary Disease

## 2015-04-04 ENCOUNTER — Encounter: Payer: Self-pay | Admitting: Pulmonary Disease

## 2015-04-04 ENCOUNTER — Telehealth: Payer: Self-pay | Admitting: Pulmonary Disease

## 2015-04-04 ENCOUNTER — Ambulatory Visit (INDEPENDENT_AMBULATORY_CARE_PROVIDER_SITE_OTHER): Payer: No Typology Code available for payment source | Admitting: Internal Medicine

## 2015-04-04 VITALS — BP 122/76 | HR 61 | Ht 63.0 in | Wt 298.0 lb

## 2015-04-04 VITALS — BP 122/76 | HR 72 | Ht 63.0 in | Wt 298.0 lb

## 2015-04-04 DIAGNOSIS — K5909 Other constipation: Secondary | ICD-10-CM

## 2015-04-04 DIAGNOSIS — G4733 Obstructive sleep apnea (adult) (pediatric): Secondary | ICD-10-CM

## 2015-04-04 DIAGNOSIS — Z1211 Encounter for screening for malignant neoplasm of colon: Secondary | ICD-10-CM | POA: Diagnosis not present

## 2015-04-04 DIAGNOSIS — K219 Gastro-esophageal reflux disease without esophagitis: Secondary | ICD-10-CM

## 2015-04-04 DIAGNOSIS — R059 Cough, unspecified: Secondary | ICD-10-CM

## 2015-04-04 DIAGNOSIS — J329 Chronic sinusitis, unspecified: Secondary | ICD-10-CM | POA: Diagnosis not present

## 2015-04-04 DIAGNOSIS — R05 Cough: Secondary | ICD-10-CM | POA: Diagnosis not present

## 2015-04-04 DIAGNOSIS — K648 Other hemorrhoids: Secondary | ICD-10-CM | POA: Diagnosis not present

## 2015-04-04 DIAGNOSIS — J441 Chronic obstructive pulmonary disease with (acute) exacerbation: Secondary | ICD-10-CM

## 2015-04-04 MED ORDER — ALBUTEROL SULFATE (2.5 MG/3ML) 0.083% IN NEBU: 2.5000 mg | INHALATION_SOLUTION | Freq: Four times a day (QID) | RESPIRATORY_TRACT | Status: DC | PRN

## 2015-04-04 NOTE — Addendum Note (Signed)
Addended by: Parke Poisson E on: 04/04/2015 04:46 PM   Modules accepted: Orders

## 2015-04-04 NOTE — Patient Instructions (Signed)
Was started on Pepcid 20 mg twice a day. Continue using the Flonase. We'll start you on albuterol neb as it to be used as needed.  We will contact [facility] to get the autodownload from the [place].  Return to clinic in 6 months

## 2015-04-04 NOTE — Progress Notes (Signed)
Subjective:    Patient ID: [name], female    DOB: [birth year], 55 y.o.   MRN: [id number]  PROBLEM LIST Chronic cough. Sinusitis GERD ? Reactive airway disease Recurrent diastolic heart failure. OSA on BiPAP 15/11 Obesity  HPI [name] is here for follow-up. She has chronic cough which is thought to be secondary to sinusitis, GERD, upper airway syndrome. She may have reactive airway disease but spirometry in the past showed only varying degrees of restriction and no obstruction. She is on BiPAP 15/11 for her sleep apnea. An autodownload was requested from [facility] but not done yet.  She has been hospitalized twice this year already from 1/29 to 02/23/15 and from 3/6 to 03/31/15 acute on chronic diastolic heart failure. In office today she does not report any dyspnea. She does have chronic cough and reports thin white mucus production.  DATA: Chest x-ray 03/27/15 No acute cardiopulmonary disease  PFTs 12/06/13 FVC 1.7 (66%) FEV1 1.52 (72%] F/F 88  PFTs 03/10/12 FVC 0.4 (50%) FEV1 0.39 [18%) F/F 98  CTA 12/31/13 No PE, no pulmonary infiltrate or acute cardiopulmonary process.   CT sinus 12/06/13 Airfluid levels in maxillary, sphenoid sinuses. Mild ethmoid and inferior maxillary sinus mucosal thickening  Social History: She is a never smoker, no alcohol, drug use.  Family History: Mother-heart disease, hypertension. Sister-asthma  Past Medical History  Diagnosis Date  . Common migraine   . Morbid obesity ([place])   . Hyperlipidemia   . OSA (obstructive sleep apnea)     CPAP dependent  . Carpal tunnel syndrome   . Hypertension   . Allergic rhinitis   . CAD (coronary artery disease)     LHC (11/29/1999):  EF 60%; no significant CAD.  [name] Chronic diastolic CHF (congestive heart failure) ([place])     a. Echo (01/21/11):  Vigorous LVF, EF 65-70%, no RWMA, Gr 2 DD. ;  b.  Echo (12/15): Moderate LVH, EF 123456, grade 2 diastolic dysfunction, mild LAE, normal RV function  .  Diverticulitis   . Acute on chronic diastolic CHF (congestive heart failure) ([place]) 04/11/2014  . Sinusitis, chronic 03/10/2012    CT sinuses 02/2012:  Chronic sinusitis with small A/F levels >> rx by ENT with prolonged augmentin F/u ct sinuses 04/2012:  Persistent sinus thickening >> rx with levaquin and clinda for 30 days.  Told to return for sinus scan in 6 weeks >> no showed for followup visit.  CT sinuses 11/2013:  Acute and chronic sinusitis noted.    [name] PNEUMONIA, ORGAN UNSPECIFIED 03/01/2009    Qualifier: Diagnosis of  By: [name] CMA, [name]    . Acute suppurative otitis media without spontaneous rupture of eardrum 02/04/2007    Centricity Description: OTITIS MEDIA, SUPPURATIVE, ACUTE, BILATERAL Qualifier: Diagnosis of  By: [name] MD, [name]  Centricity Description: OTITIS MEDIA, SUPPURATIVE, ACUTE Qualifier: Diagnosis of  By: [name] MD, [name]   . Carpal tunnel syndrome 06/09/2006    Qualifier: Diagnosis of  By: Marca Ancona RMA, Lucy    . IBS (irritable bowel syndrome)   . Diastolic heart failure ([place])   . Hepatic steatosis   . Hiatal hernia   . Hepatomegaly   . Internal hemorrhoids     Current outpatient prescriptions:  .  aspirin EC 81 MG EC tablet, Take 1 tablet (81 mg total) by mouth daily., Disp: 30 tablet, Rfl: 3 .  dextromethorphan-guaiFENesin (MUCINEX DM) 30-600 MG 12hr tablet, Take 1 tablet by mouth every 12 (twelve) hours., Disp: , Rfl:  .  fluticasone (FLONASE) 50 MCG/ACT nasal spray, Place 2 sprays into both nostrils 2 (two) times daily as needed for allergies or rhinitis., Disp: 16 g, Rfl: 2 .  furosemide (LASIX) 40 MG tablet, Take 2 tablets (80 mg total) by mouth 2 (two) times daily. For swelling or fluid retention., Disp: 360 tablet, Rfl: 3 .  isosorbide-hydrALAZINE (BIDIL) 20-37.5 MG tablet, Take 1 tablet by mouth 3 (three) times daily., Disp: 90 tablet, Rfl: 3 .  losartan (COZAAR) 100 MG tablet, TAKE 1 TABLET (100 MG TOTAL) BY MOUTH DAILY., Disp: 90 tablet, Rfl: 0 .   metolazone (ZAROXOLYN) 2.5 MG tablet, Take 1 tablet (2.5 mg total) by mouth daily as needed. see notes (Patient taking differently: Take 2.5 mg by mouth daily as needed (for fluid). ), Disp: 90 tablet, Rfl: 1 .  omega-3 acid ethyl esters (LOVAZA) 1 g capsule, Take 1 capsule (1 g total) by mouth 2 (two) times daily. (Patient taking differently: Take 1 g by mouth 3 (three) times daily. ), Disp: 60 capsule, Rfl: 3 .  potassium chloride SA (KLOR-CON M20) 20 MEQ tablet, 2 tablets three times daily and one extra tablet as needed, Disp: 217 tablet, Rfl: 3 .  rosuvastatin (CRESTOR) 20 MG tablet, Take 1 tablet (20 mg total) by mouth at bedtime., Disp: 30 tablet, Rfl: 3 .  spironolactone (ALDACTONE) 25 MG tablet, Take 0.5 tablets (12.5 mg total) by mouth 2 (two) times daily., Disp: 60 tablet, Rfl: 3 .  topiramate (TOPAMAX) 25 MG tablet, Take 25 mg by mouth 2 (two) times daily as needed (for headaches). , Disp: , Rfl:  .  traMADol (ULTRAM) 50 MG tablet, Take 1 tablet (50 mg total) by mouth every 6 (six) hours as needed for moderate pain., Disp: 15 tablet, Rfl: 0 .  albuterol (PROVENTIL HFA;VENTOLIN HFA) 108 (90 BASE) MCG/ACT inhaler, Inhale 1-2 puffs into the lungs every 6 (six) hours as needed for wheezing or shortness of breath. (Patient not taking: Reported on 04/04/2015), Disp: 1 Inhaler, Rfl: 0  Review of Systems Cough, white sputum production. Denies any dyspnea, wheezing, hemoptysis. Denies any nausea, vomiting, diarrhea, constipation Denies any fevers, chills, loss of weight, loss of appetite. Denies any chest pain, palpitation. All other ROS are negative.     Objective:   Physical Exam Blood pressure 122/76, pulse 61, height 5\' 3"  (1.6 m), weight 298 lb (135.172 kg), SpO2 98 %. Gen: Obese, No apparent distress Neuro: No gross focal deficits. Neck: No JVD, lymphadenopathy, thyromegaly. RS: Clear, No wheeze or crackles CVS: S1-S2 heard, no murmurs rubs gallops. Abdomen: Soft, positive bowel  sounds. Extremities: No edema.    Assessment & Plan:  #1 Acute resp failure She's had recurrent respiratory failure secondary to diastolic heart failure. She was recently discharged last week and is maintaining her weight with Lasix and has follow-up with cardiology.   #2 Chronic cough Secondary to chronic sinusitis, GERD. She is on Flonase but has stopped taking her acid suppression medications.  I will resume her pepcid 20 mg bid. If her symptoms persist then she may need an ENT referral. She may have reactive airway disease but is unable to use the inhalers correctly as she cannot "inhale". I will switch her to nebulizers  #3 OSA on Bipap I dont have her sleep study to review and cannot determine why she is on Bipap. This may be due to her heart failure. I will get the autotitration dowload from [facility] to review the pressures she is getting.  Plan: - Continue  flonase, start pepcid - Start albuterol nebs - Review Bipap setting with [facility]  Marshell Garfinkel MD [facility] Pager [phone] If no answer or after 3pm call: [phone] 04/04/2015, 11:36 AM

## 2015-04-04 NOTE — Telephone Encounter (Addendum)
Have called [facility] and spoken with RT Elmyra Ricks who reported that they have ATC pt to pick up her old machine but pt was out of town.  Per Elmyra Ricks pt does qualify for a new machine and she will attempt to contact pt again regarding the above and will also work on obtaining auto-titration - results to be forwarded to PM.  Albuterol neb has been sent to pt's local pharmacy   Called spoke with patient to discuss the above.  Pt voiced her understanding, however she would like to receive her neb machine tonight.  Advised pt will speak with our liaison to see if this can be accomodated.  Called spoke with [facility] @ [place] [phone]) who reported that pt can receive her machine today.  Insurance and pt's contact number verified w/ Mandy.    Called spoke with patient, she is aware of the above.  Nothing further needed; will sign off.

## 2015-04-04 NOTE — Patient Instructions (Signed)
You will receive a phone call from [facility] within a week or so to help you get started with your Cologuard

## 2015-04-04 NOTE — Progress Notes (Signed)
HISTORY OF PRESENT ILLNESS:  [name] is a 55 y.o. female with MULTIPLE SIGNIFICANT medical problems including but not limited to morbid obesity with a BMI of greater than 50, hypertension, congestive heart failure, obstructive sleep apnea requiring CPAP, and hyperlipidemia. Recently hospitalized with community-acquired pneumonia (hospital discharge summary, CT scan, and laboratories reviewed). Patient presents today with a chief complaint of constipation. As well, she is interested in screening colonoscopy. She was last evaluated in this office for chronic postprandial cramping with loose stools. See that dictation for details. Patient reports that she has chronic stable constipation. Takes 3 stool softeners daily. Occasional milk of magnesia. At times, she does notice blood on the tissue with straining. She has known to have hemorrhoids. Patient actually underwent complete colonoscopy October 2005 to evaluate rectal bleeding. At that time examination was normal except for internal hemorrhoids. Recent CT scan of the abdomen and pelvis was without acute abnormalities. Incidental umbilical hernia noted. Discharge laboratories with creatinine 0.8 and white blood cell count 11.7. Hemoglobin 11.3. Patient's GI review of systems is remarkable for intermittent problems with acid reflux and bloating.  REVIEW OF SYSTEMS:  All non-GI ROS negative except for sinus and allergy, arthritis, cough, headaches, shortness of breath, ankle edema  Past Medical History  Diagnosis Date  . Common migraine   . Morbid obesity ([place])   . Hyperlipidemia   . OSA (obstructive sleep apnea)     CPAP dependent  . Carpal tunnel syndrome   . Hypertension   . Allergic rhinitis   . CAD (coronary artery disease)     LHC (11/29/1999):  EF 60%; no significant CAD.  [name] Chronic diastolic CHF (congestive heart failure) ([place])     a. Echo (01/21/11):  Vigorous LVF, EF 65-70%, no RWMA, Gr 2 DD. ;  b.  Echo (12/15): Moderate LVH, EF  123456, grade 2 diastolic dysfunction, mild LAE, normal RV function  . Diverticulitis   . Acute on chronic diastolic CHF (congestive heart failure) ([place]) 04/11/2014  . Sinusitis, chronic 03/10/2012    CT sinuses 02/2012:  Chronic sinusitis with small A/F levels >> rx by ENT with prolonged augmentin F/u ct sinuses 04/2012:  Persistent sinus thickening >> rx with levaquin and clinda for 30 days.  Told to return for sinus scan in 6 weeks >> no showed for followup visit.  CT sinuses 11/2013:  Acute and chronic sinusitis noted.    [name] PNEUMONIA, ORGAN UNSPECIFIED 03/01/2009    Qualifier: Diagnosis of  By: [name] CMA, [name]    . Acute suppurative otitis media without spontaneous rupture of eardrum 02/04/2007    Centricity Description: OTITIS MEDIA, SUPPURATIVE, ACUTE, BILATERAL Qualifier: Diagnosis of  By: [name] MD, [name]  Centricity Description: OTITIS MEDIA, SUPPURATIVE, ACUTE Qualifier: Diagnosis of  By: [name] MD, [name]   . Carpal tunnel syndrome 06/09/2006    Qualifier: Diagnosis of  By: Marca Ancona RMA, Lucy    . IBS (irritable bowel syndrome)   . Diastolic heart failure ([place])   . Hepatic steatosis   . Hiatal hernia   . Hepatomegaly   . Internal hemorrhoids     Past Surgical History  Procedure Laterality Date  . Tubal ligation    . Tonsillectomy    . Uvulopalatopharyngoplasty    . Cesarean section    . Nasal sinus surgery    . Partial hysterectomy      Social History LATIVIA WENDORF  reports that she has never smoked. She has never used smokeless tobacco. She reports that  she does not drink alcohol or use illicit drugs.  family history includes Asthma in her sister; Heart attack in her mother; Heart disease in her mother; Hypertension in her mother. There is no history of Stroke.  Allergies  Allergen Reactions  . Sulfonamide Derivatives Anaphylaxis, Swelling and Rash    Swelling tongue and ear   . Doxycycline Nausea And Vomiting  . Latex Hives  . Ciprofloxacin Rash  . Hydrocortisone  Rash  . Neomycin Rash       PHYSICAL EXAMINATION: Vital signs: BP 122/76 mmHg  Pulse 72  Ht 5\' 3"  (1.6 m)  Wt 298 lb (135.172 kg)  BMI 52.80 kg/m2  Constitutional: Obese, unhealthy appearing, no acute distress Psychiatric: alert and oriented x3, cooperative Eyes: extraocular movements intact, anicteric, conjunctiva pink Mouth: oral pharynx moist, no lesions Neck: supple without thyromegaly Lymph: no lymphadenopathy Cardiovascular: heart regular rate and rhythm, no murmur Lungs: clear to auscultation bilaterally with distant breath sounds Abdomen: soft, obese, nontender, nondistended, no obvious ascites, no peritoneal signs, normal bowel sounds, no organomegaly. Small umbilical hernia Rectal: Deferred Extremities: no clubbing cyanosis or significant lower extremity edema bilaterally Skin: no lesions on visible extremities Neuro: No focal deficits. Cranial nerves intact. Normal DTRs.    ASSESSMENT:  1. Functional constipation 2. Rare minor rectal bleeding likely secondary to known hemorrhoids 3. Colon cancer screening. Baseline risk for neoplasia. High risk for procedure 4. IBS 5. GERD 6. MULTIPLE SIGNIFICANT medical problems ongoing with recent pneumonia    PLAN:  1. Recommend continuing stool softeners for constipation. Could use milk of magnesia more regularly to achieve 1-2 bowel movements daily. Hold for diarrhea 2. Discuss colon cancer screening strategies. Given her high risk for procedural work, will start with Cologuard. Explained her the basic background of this test and plans regarding positive and negative results 3. Reflux precautions with attention to weight loss 4. Ongoing general medical care with PCP and multiple specialists 25 minutes was spent face-to-face with the patient. Greater than 50% of the time use for counseling regarding her multiple GI complaints and colon cancer screening strategy

## 2015-04-06 ENCOUNTER — Telehealth: Payer: Self-pay | Admitting: Pulmonary Disease

## 2015-04-06 NOTE — Telephone Encounter (Signed)
Called [facility] and [name] was NA. Called [facility] ([facility]). They are needing an order with pt BIPAP settings, etc sent over to them. They are faxing over pt BIPAP titration report for Dr. Vaughan Browner to review prior to placing this order. Will hold until report is received.

## 2015-04-06 NOTE — Telephone Encounter (Signed)
Download received and given to [place]. Please advise Dr. Vaughan Browner thanks

## 2015-04-07 ENCOUNTER — Ambulatory Visit (INDEPENDENT_AMBULATORY_CARE_PROVIDER_SITE_OTHER): Payer: No Typology Code available for payment source | Admitting: Internal Medicine

## 2015-04-07 ENCOUNTER — Encounter: Payer: Self-pay | Admitting: Internal Medicine

## 2015-04-07 VITALS — BP 124/68 | HR 69 | Ht 63.0 in | Wt 297.0 lb

## 2015-04-07 DIAGNOSIS — R05 Cough: Secondary | ICD-10-CM

## 2015-04-07 DIAGNOSIS — R058 Other specified cough: Secondary | ICD-10-CM

## 2015-04-07 MED ORDER — PREDNISONE 10 MG PO TABS: ORAL_TABLET | ORAL | Status: DC

## 2015-04-07 MED ORDER — AMOXICILLIN-POT CLAVULANATE 875-125 MG PO TABS: 1.0000 | ORAL_TABLET | Freq: Two times a day (BID) | ORAL | Status: DC

## 2015-04-07 MED ORDER — PANTOPRAZOLE SODIUM 40 MG PO TBEC: 40.0000 mg | DELAYED_RELEASE_TABLET | Freq: Every day | ORAL | Status: DC

## 2015-04-07 MED ORDER — FAMOTIDINE 20 MG PO TABS: ORAL_TABLET | ORAL | Status: DC

## 2015-04-07 MED ORDER — TRAMADOL HCL 50 MG PO TABS: 50.0000 mg | ORAL_TABLET | Freq: Four times a day (QID) | ORAL | Status: DC | PRN

## 2015-04-07 NOTE — Progress Notes (Signed)
Subjective:    Patient ID: [name], female    DOB: [birth year]   MRN: [id number]  Brief patient profile:  55 yobf never smoker  with known hx of Chronic cough, sinusitis and qestion RAD    History of Present Illness  06/10/2013 ER follow up  Was seen in ER on 5/15 for bronchitis , tx w/ Zpack and pred taper. CXR w/ no sign of acute changes, noted diffuse interstitial markings. Says got only minimally improved.  Complains of prod cough with green mucus, wheezing, dyspnea, tightness.  finished zpak and pred pak yesterday.  would like to discuss beginning nebs, says RT in ER said she should be on this at home.  Was started on QVAR last ov but not taking .   rec Augmentin 875mg  Twice daily  For 7 days  Mucinex DM Twice daily  As needed  Cough/congestion.  Fluids and rest   Begin Symbicort 80/4.53mcg 2 puffs Twice daily  - rinse after use.      09/17/2013  Acute extended  ov/Vallorie Niccoli re: asthma vs uacs Chief Complaint  Patient presents with  . Acute Visit    Pt c/o increased SOB, wheezing and prod cough with large amounts of white sputum x 1 wk.   Sinus problems by Lucia Gaskins x 5 years and problems with cough/wheezing x 3 years. Cough is worse p lie down and try to put on cpap  Much worse x one week but was much better p last ov and when I saw her in 10/2012 and rx as rhinitis/ gerd, not asthma. She has been "maintaining " on an empty symbicort and qvar via spacer. Really only sob at rest when coughing  rec Augmentin 875 mg twice daily x 10 days Prednisone 10 mg take  4 each am x 2 days,   2 each am x 2 days,  1 each am x 2 days and stop  Pantoprazole (protonix) 40 mg   Take 30-60 min before first meal of the day and Pepcid 20 mg one bedtime until return to office - this is the best way to tell whether stomach acid is contributing to your problem.   Take mucinex dm 2  every 12 hours and supplement if needed with  tramadol 50 mg up to 2 every 4 hours  GERD diet   Symbicort 80 via spacer  80 Take 2 puffs first thing in am and then another 2 puffs about 12 hours later via spacer sample    04/07/2015 acute extended ov/Charron Coultas re: chronic cough / min hemoptysis  Chief Complaint  Patient presents with  . Acute Visit    Pt c/o with blood streaked sputum- started this am. She has some nasal congestion.    cough since Jan 2017 mostly dry / hacking day > noct assoc with nasal congestion but no epistaxis or excess/ purulent sputum or mucus plugs    No obvious day to day or daytime variabilty or assoc sob unless coughing or  cp or chest tightness, subjective wheeze overt sinus or hb symptoms. No unusual exp hx or h/o childhood pna/ asthma or knowledge of premature birth.   Also denies any obvious fluctuation of symptoms with weather or environmental changes or other aggravating or alleviating factors except as outlined above   Current Medications, Allergies, Complete Past Medical History, Past Surgical History, Family History, and Social History were reviewed in [employer] record.  ROS  The following are not active complaints unless bolded sore throat,  dysphagia, dental problems, itching, sneezing,  nasal congestion or excess/ purulent secretions, ear ache,   fever, chills, sweats, unintended wt loss, pleuritic or exertional cp, hemoptysis,  orthopnea pnd or leg swelling, presyncope, palpitations, heartburn, abdominal pain, anorexia, nausea, vomiting, diarrhea  or change in bowel or urinary habits, change in stools or urine, dysuria,hematuria,  rash, arthralgias, visual complaints, headache, numbness weakness or ataxia or problems with walking or coordination,  change in mood/affect or memory.            Objective:   Physical Exam  GEN: A/Ox3; pleasant , NAD, massively obese female nad  Harsh upper airway cough   04/07/2015        297   09/17/13 303 lb 12.8 oz (137.803 kg)  06/25/13 307 lb (139.254 kg)  06/10/13 313 lb 3.2 oz (142.067 kg)      HEENT:  [place]/AT,   EACs-clear, TMs-wnl, NOSE-clear, THROAT-clear, no lesions, no postnasal drip or exudate noted.   NECK:  Supple w/ fair ROM; no JVD; normal carotid impulses w/o bruits; no thyromegaly or nodules palpated; no lymphadenopathy.  RESP  Diminished BS in bases, w/o, wheezes/ rales/ or rhonchi.no accessory muscle use, no dullness to percussion  CARD:  RRR, no m/r/g  ,tr peripheral edema, pulses intact, no cyanosis or clubbing.  GI:   Soft & nt; nml bowel sounds; no organomegaly or masses detected.  Musco: Warm bil, no deformities or joint swelling noted.   Neuro: alert, no focal deficits noted.    Skin: Warm, no lesions or rashes      I personally reviewed images and agree with radiology impression as follows:  CT Chest   03/27/15 No acute cardiopulmonary disease           Assessment & Plan:

## 2015-04-07 NOTE — Telephone Encounter (Signed)
Dr. Mannam please advise. °

## 2015-04-07 NOTE — Patient Instructions (Signed)
Augmentin 875 mg take one pill twice daily  X 10 days - take at breakfast and supper with large glass of water.  It would help reduce the usual side effects (diarrhea and yeast infections) if you ate cultured yogurt at lunch.   Prednisone 10 mg take  4 each am x 2 days,   2 each am x 2 days,  1 each am x 2 days and stop   Pantoprazole (protonix) 40 mg   Take 30-60 min before first meal of the day and Pepcid 20 mg one bedtime until return to office - this is the best way to tell whether stomach acid is contributing to your problem.    Take mucinex dm 2  every 12 hours and supplement if needed with  tramadol 50 mg up to 2 every 4 hours to suppress the urge to cough. Swallowing water or using ice chips/non mint and menthol containing candies (such as lifesavers or sugarless jolly ranchers) are also effective.  You should rest your voice and avoid activities that you know make you cough.  Once you have eliminated the cough for 3 straight days try reducing the tramadol first,  then the mucinex dm GERD (REFLUX)  is an extremely common cause of respiratory symptoms, many times with no significant heartburn at all.    It can be treated with medication, but also with lifestyle changes including avoidance of late meals, excessive alcohol, smoking cessation, and avoid fatty foods, chocolate, peppermint, colas, red wine, and acidic juices such as orange juice.  NO MINT OR MENTHOL PRODUCTS SO NO COUGH DROPS  USE SUGARLESS CANDY INSTEAD ( sugarless jolley ranchers or Stover's)  NO OIL BASED VITAMINS - use powdered substitutes.  Keep previous appointment to see Dr Vaughan Browner

## 2015-04-07 NOTE — Telephone Encounter (Signed)
Autodownload load reviewed. Good compliance.  Continue auto level Bipap at current settings.

## 2015-04-07 NOTE — Telephone Encounter (Signed)
Called and spoke with pt. Informed of PM's recs. She c/o a prod cough with brown mucus, sinus drainage/congestion. Denies any chest congestion/tightness, SOB, wheezing, fever, nausea or vomiting. Offered ov with MW on 04/07/15. Scheduled her with MW on 04/07/15 at 3:30pm. She voiced understanding and had no further questions.

## 2015-04-11 DIAGNOSIS — R05 Cough: Secondary | ICD-10-CM | POA: Insufficient documentation

## 2015-04-11 DIAGNOSIS — J440 Chronic obstructive pulmonary disease with acute lower respiratory infection: Secondary | ICD-10-CM | POA: Diagnosis present

## 2015-04-11 DIAGNOSIS — R058 Other specified cough: Secondary | ICD-10-CM | POA: Insufficient documentation

## 2015-04-11 NOTE — Assessment & Plan Note (Signed)
Body mass index is 52.62   Lab Results  Component Value Date   TSH 1.546 03/27/2015     Contributing to gerd tendency/ doe/reviewed the need and the process to achieve and maintain neg calorie balance > defer f/u primary care including intermittently monitoring thyroid status

## 2015-04-11 NOTE — Assessment & Plan Note (Signed)
The most common causes of chronic cough in immunocompetent adults include the following: upper airway cough syndrome (UACS), previously referred to as postnasal drip syndrome (PNDS), which is caused by variety of rhinosinus conditions; (2) asthma; (3) GERD; (4) chronic bronchitis from cigarette smoking or other inhaled environmental irritants; (5) nonasthmatic eosinophilic bronchitis; and (6) bronchiectasis.   These conditions, singly or in combination, have accounted for up to 94% of the causes of chronic cough in prospective studies.   Other conditions have constituted no >6% of the causes in prospective studies These have included bronchogenic carcinoma, chronic interstitial pneumonia, sarcoidosis, left ventricular failure, ACEI-induced cough, and aspiration from a condition associated with pharyngeal dysfunction.    Chronic cough is often simultaneously caused by more than one condition. A single cause has been found from 38 to 82% of the time, multiple causes from 18 to 62%. Multiply caused cough has been the result of three diseases up to 42% of the time.       Based on hx and exam, this is most likely:  Classic Upper airway cough syndrome, so named because it's frequently impossible to sort out how much is  CR/sinusitis with freq throat clearing (which can be related to primary GERD)   vs  causing  secondary (" extra esophageal")  GERD from wide swings in gastric pressure that occur with throat clearing, often  promoting self use of mint and menthol lozenges that reduce the lower esophageal sphincter tone and exacerbate the problem further in a cyclical fashion.   These are the same pts (now being labeled as having "irritable larynx syndrome" by some cough centers) who not infrequently have a history of having failed to tolerate ace inhibitors,  dry powder inhalers or biphosphonates or report having atypical reflux symptoms that don't respond to standard doses of PPI , and are easily confused as  having aecopd or asthma flares by even experienced allergists/ pulmonologists.   The first step is to maximize acid suppression / treat for possible sinusitis and eliminate cyclical coughing then regroup with Dr Vaughan Browner, perhaps with sinus ct next step.  I had an extended discussion with the patient reviewing all relevant studies completed to date and  lasting 15 to 20 minutes of a 25 minute visit on the following ongoing concerns:   Each maintenance medication was reviewed in detail including most importantly the difference between maintenance and prns and under what circumstances the prns are to be triggered using an action plan format that is not reflected in the computer generated alphabetically organized AVS.    Please see instructions for details which were reviewed in writing and the patient given a copy highlighting the part that I personally wrote and discussed at today's ov.   See instructions for specific recommendations which were reviewed directly with the patient who was given a copy with highlighter outlining the key components.

## 2015-04-14 ENCOUNTER — Ambulatory Visit: Payer: No Typology Code available for payment source | Admitting: Physician Assistant

## 2015-04-23 NOTE — Progress Notes (Signed)
HPI  Pt presents for follow up of follow-up of acute on chronic diastolic HF.  The patient was hospitalized earlier this month with acute on chronic heart failure.  This was probably multifactorial with some thought that she also had bronchitis.  I reviewed these hospital records.  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 Also since she got out of the hospital her youngest sister died suddenly of pneumonia. The patient actually is having some weakness and fatigue but she's not having any new shortness of breath, PND or orthopnea. She's not having any palpitations, presyncope or syncope. She's had some slow steady weight loss and is down 25 pounds from her peak. She is trying to avoid salt. She started to limit her fluids more carefully. Of note she did have an echo in the hospital and her EF was said to be normal although these were poor acoustic windows.   Allergies  Allergen Reactions  . Sulfonamide Derivatives Anaphylaxis, Swelling and Rash    Swelling tongue and ear   . Doxycycline Nausea And Vomiting  . Latex Hives  . Ciprofloxacin Rash  . Hydrocortisone Rash  . Neomycin Rash    Current Outpatient Prescriptions  Medication Sig Dispense Refill  . albuterol (PROVENTIL) (2.5 MG/3ML) 0.083% nebulizer solution Take 3 mLs (2.5 mg total) by nebulization every 6 (six) hours as needed for wheezing or shortness of breath (Dx: J44.1). 75 mL 3  . aspirin EC 81 MG EC tablet Take 1 tablet (81 mg total) by mouth daily. 30 tablet 3  . dextromethorphan-guaiFENesin (MUCINEX DM) 30-600 MG 12hr tablet Take 1 tablet by mouth every 12 (twelve) hours.    . famotidine (PEPCID) 20 MG tablet One at bedtime    . fluticasone (FLONASE) 50 MCG/ACT nasal spray Place 2 sprays into both nostrils 2 (two) times daily as needed for allergies or rhinitis. 16 g 2  . furosemide (LASIX) 40 MG tablet Take 2 tablets (80 mg total) by mouth 2 (two) times  daily. For swelling or fluid retention. 360 tablet 3  . isosorbide-hydrALAZINE (BIDIL) 20-37.5 MG tablet Take 1 tablet by mouth 3 (three) times daily. 90 tablet 3  . Linaclotide (LINZESS) 145 MCG CAPS capsule Take 145 mcg by mouth daily.    [name] losartan (COZAAR) 100 MG tablet TAKE 1 TABLET (100 MG TOTAL) BY MOUTH DAILY. 90 tablet 0  . metolazone (ZAROXOLYN) 2.5 MG tablet Take 1 tablet (2.5 mg total) by mouth daily as needed. see notes (Patient taking differently: Take 2.5 mg by mouth daily as needed (for fluid). ) 90 tablet 1  . pantoprazole (PROTONIX) 40 MG tablet Take 1 tablet (40 mg total) by mouth daily. Take 30-60 min before first meal of the day 30 tablet 2  . potassium chloride SA (KLOR-CON M20) 20 MEQ tablet 2 tablets three times daily and one extra tablet as needed 217 tablet 3  . rosuvastatin (CRESTOR) 20 MG tablet Take 1 tablet (20 mg total) by mouth at bedtime. 30 tablet 3  . spironolactone (ALDACTONE) 25 MG tablet Take 0.5 tablets (12.5 mg total) by mouth 2 (two) times daily. 60 tablet 3  . topiramate (TOPAMAX) 25 MG tablet Take 25 mg by mouth 2 (two) times daily as needed (for headaches).     . traMADol (ULTRAM) 50 MG tablet Take 1 tablet (50 mg total) by mouth every 6 (six) hours as needed for moderate pain. 15 tablet 0  No current facility-administered medications for this visit.    Past Medical History  Diagnosis Date  . Common migraine   . Morbid obesity ([place])   . Hyperlipidemia   . OSA (obstructive sleep apnea)     CPAP dependent  . Carpal tunnel syndrome   . Hypertension   . Allergic rhinitis   . CAD (coronary artery disease)     LHC (11/29/1999):  EF 60%; no significant CAD.  [name] Chronic diastolic CHF (congestive heart failure) ([place])     a. Echo (01/21/11):  Vigorous LVF, EF 65-70%, no RWMA, Gr 2 DD. ;  b.  Echo (12/15): Moderate LVH, EF 123456, grade 2 diastolic dysfunction, mild LAE, normal RV function  . Diverticulitis   . Acute on chronic diastolic CHF (congestive  heart failure) ([place]) 04/11/2014  . Sinusitis, chronic 03/10/2012    CT sinuses 02/2012:  Chronic sinusitis with small A/F levels >> rx by ENT with prolonged augmentin F/u ct sinuses 04/2012:  Persistent sinus thickening >> rx with levaquin and clinda for 30 days.  Told to return for sinus scan in 6 weeks >> no showed for followup visit.  CT sinuses 11/2013:  Acute and chronic sinusitis noted.    [name] PNEUMONIA, ORGAN UNSPECIFIED 03/01/2009    Qualifier: Diagnosis of  By: [name] CMA, [name]    . Acute suppurative otitis media without spontaneous rupture of eardrum 02/04/2007    Centricity Description: OTITIS MEDIA, SUPPURATIVE, ACUTE, BILATERAL Qualifier: Diagnosis of  By: [name] MD, [name]  Centricity Description: OTITIS MEDIA, SUPPURATIVE, ACUTE Qualifier: Diagnosis of  By: [name] MD, [name]   . Carpal tunnel syndrome 06/09/2006    Qualifier: Diagnosis of  By: Marca Ancona RMA, Lucy    . IBS (irritable bowel syndrome)   . Diastolic heart failure ([place])   . Hepatic steatosis   . Hiatal hernia   . Hepatomegaly   . Internal hemorrhoids     Past Surgical History  Procedure Laterality Date  . Tubal ligation    . Tonsillectomy    . Uvulopalatopharyngoplasty    . Cesarean section    . Nasal sinus surgery    . Partial hysterectomy      ROS:  As stated in the HPI and negative for all other systems.  PHYSICAL EXAM BP 120/60 mmHg  Pulse 88  Ht 5\' 3"  (1.6 m)  Wt 289 lb (131.09 kg)  BMI 51.21 kg/m2 GENERAL:  Well appearing NECK:  No jugular venous distention, waveform within normal limits, carotid upstroke brisk and symmetric, no bruits, no thyromegaly LUNGS:  Clear to auscultation bilaterally BACK:  No CVA tenderness CHEST:  Unremarkable HEART:  PMI not displaced or sustained,S1 and S2 within normal limits, no S3, no S4, no clicks, no rubs, no murmurs ABD:  Obese, positive bowel sounds normal in frequency in pitch, no bruits, no rebound, no guarding, no midline pulsatile mass, no hepatomegaly, no  splenomegaly EXT:  2 plus pulses throughout, no edema, no cyanosis no clubbing    ASSESSMENT AND PLAN   Chronic diastolic heart failure I reviewed her hospital records. I reviewed with her daily weights and when necessary dosing of her diuretic. At this point no change in her therapy as otherwise indicated.   We reviewed fluid restriction.  Essential hypertension  The blood pressure is at target. No change in medications is indicated. We will continue with therapeutic lifestyle changes (TLC).  Obstructive sleep apnea I instructed her to follow-up with pulmonary who has been managing this.  Morbid Obesity  She understands the need to lose weight with diet and exercise.   I applauded her weight loss.  Hypokalemia She did have this in the hospital and I will check a basic metabolic profile.  Hospital records reviewed

## 2015-04-24 ENCOUNTER — Ambulatory Visit (INDEPENDENT_AMBULATORY_CARE_PROVIDER_SITE_OTHER): Payer: No Typology Code available for payment source | Admitting: Cardiology

## 2015-04-24 ENCOUNTER — Ambulatory Visit: Payer: No Typology Code available for payment source | Admitting: Physician Assistant

## 2015-04-24 ENCOUNTER — Encounter: Payer: Self-pay | Admitting: Cardiology

## 2015-04-24 VITALS — BP 120/60 | HR 88 | Ht 63.0 in | Wt 289.0 lb

## 2015-04-24 DIAGNOSIS — Z79899 Other long term (current) drug therapy: Secondary | ICD-10-CM

## 2015-04-24 LAB — BASIC METABOLIC PANEL
BUN: 30 mg/dL — AB (ref 7–25)
CALCIUM: 9.5 mg/dL (ref 8.6–10.4)
CO2: 28 mmol/L (ref 20–31)
Chloride: 100 mmol/L (ref 98–110)
Creat: 1.07 mg/dL — ABNORMAL HIGH (ref 0.50–1.05)
GLUCOSE: 156 mg/dL — AB (ref 65–99)
POTASSIUM: 3.2 mmol/L — AB (ref 3.5–5.3)
Sodium: 139 mmol/L (ref 135–146)

## 2015-04-24 NOTE — Patient Instructions (Signed)
Your physician wants you to follow-up in: 4 Months. You will receive a reminder letter in the mail two months in advance. If you don't receive a letter, please call our office to schedule the follow-up appointment.  Your physician recommends that you return for lab work BMP

## 2015-04-25 ENCOUNTER — Telehealth: Payer: Self-pay | Admitting: Internal Medicine

## 2015-04-25 ENCOUNTER — Other Ambulatory Visit: Payer: Self-pay

## 2015-04-25 DIAGNOSIS — E876 Hypokalemia: Secondary | ICD-10-CM

## 2015-04-26 NOTE — Telephone Encounter (Signed)
We do not carry samples of Linzess  TRIED TO CONTACT PT  [facility] TIME-WARNER PHONE SERVICE NOT ACCEPTING MESSAGES  We do not carry samples of Linzess  We can give her a savings card

## 2015-05-03 ENCOUNTER — Encounter: Payer: Self-pay | Admitting: Internal Medicine

## 2015-05-08 ENCOUNTER — Telehealth: Payer: Self-pay | Admitting: Internal Medicine

## 2015-05-08 NOTE — Telephone Encounter (Signed)
Patient claims that even with a copay card price for Linzess would be $100.  I told her I would contact the rep and make sure that was accurate

## 2015-05-12 NOTE — Telephone Encounter (Signed)
Upon further review of patient's chart, I realized that she has only been prescribed Linzess by her PCP, Dr. Luciana Axe.  However, Lennie Odor, the rep, also works with [employer] and will investigate ways to help patient get this medication.  I spoke with Rip Harbour, Dr. Merilyn Baba CMA, and told her that Wille Glaser would call her so they could continue trying to help patient.  She agreed.

## 2015-05-29 ENCOUNTER — Other Ambulatory Visit: Payer: Self-pay

## 2015-06-01 ENCOUNTER — Ambulatory Visit (INDEPENDENT_AMBULATORY_CARE_PROVIDER_SITE_OTHER): Payer: No Typology Code available for payment source | Admitting: Internal Medicine

## 2015-06-01 ENCOUNTER — Encounter: Payer: Self-pay | Admitting: Internal Medicine

## 2015-06-01 ENCOUNTER — Other Ambulatory Visit (INDEPENDENT_AMBULATORY_CARE_PROVIDER_SITE_OTHER): Payer: No Typology Code available for payment source

## 2015-06-01 VITALS — BP 130/68 | HR 91 | Ht 63.0 in | Wt 294.0 lb

## 2015-06-01 DIAGNOSIS — R05 Cough: Secondary | ICD-10-CM

## 2015-06-01 DIAGNOSIS — R058 Other specified cough: Secondary | ICD-10-CM

## 2015-06-01 DIAGNOSIS — I5033 Acute on chronic diastolic (congestive) heart failure: Secondary | ICD-10-CM

## 2015-06-01 DIAGNOSIS — E876 Hypokalemia: Secondary | ICD-10-CM | POA: Diagnosis not present

## 2015-06-01 LAB — CBC WITH DIFFERENTIAL/PLATELET
BASOS ABS: 0 10*3/uL (ref 0.0–0.1)
Basophils Relative: 0.2 % (ref 0.0–3.0)
EOS ABS: 0.4 10*3/uL (ref 0.0–0.7)
Eosinophils Relative: 2.9 % (ref 0.0–5.0)
HEMATOCRIT: 33.3 % — AB (ref 36.0–46.0)
HEMOGLOBIN: 11.4 g/dL — AB (ref 12.0–15.0)
LYMPHS PCT: 27.8 % (ref 12.0–46.0)
Lymphs Abs: 3.6 10*3/uL (ref 0.7–4.0)
MCHC: 34.2 g/dL (ref 30.0–36.0)
MCV: 89.6 fl (ref 78.0–100.0)
Monocytes Absolute: 0.8 10*3/uL (ref 0.1–1.0)
Monocytes Relative: 5.8 % (ref 3.0–12.0)
Neutro Abs: 8.3 10*3/uL — ABNORMAL HIGH (ref 1.4–7.7)
Neutrophils Relative %: 63.3 % (ref 43.0–77.0)
Platelets: 276 10*3/uL (ref 150.0–400.0)
RBC: 3.71 Mil/uL — AB (ref 3.87–5.11)
RDW: 15.6 % — ABNORMAL HIGH (ref 11.5–15.5)
WBC: 13.1 10*3/uL — ABNORMAL HIGH (ref 4.0–10.5)

## 2015-06-01 LAB — BASIC METABOLIC PANEL
BUN: 28 mg/dL — ABNORMAL HIGH (ref 6–23)
CALCIUM: 9.6 mg/dL (ref 8.4–10.5)
CO2: 29 mEq/L (ref 19–32)
CREATININE: 1.04 mg/dL (ref 0.40–1.20)
Chloride: 98 mEq/L (ref 96–112)
GFR: 70.36 mL/min (ref >60.00)
GLUCOSE: 109 mg/dL — AB (ref 70–99)
Potassium: 3.2 mEq/L — ABNORMAL LOW (ref 3.5–5.1)
SODIUM: 138 meq/L (ref 135–145)

## 2015-06-01 LAB — NITRIC OXIDE: Nitric Oxide: 8

## 2015-06-01 MED ORDER — MONTELUKAST SODIUM 10 MG PO TABS: 10.0000 mg | ORAL_TABLET | Freq: Every day | ORAL | Status: DC

## 2015-06-01 MED ORDER — PREDNISONE 10 MG PO TABS: ORAL_TABLET | ORAL | Status: DC

## 2015-06-01 NOTE — Patient Instructions (Addendum)
Please see patient coordinator before you leave today  to schedule sinus CT  Please remember to go to the lab   department downstairs for your tests - we will call you with the results when they are available.     Prednisone 10 mg take  4 each am x 2 days,   2 each am x 2 days,  1 each am x 2 days and stop   Continue Pantoprazole (protonix) 40 mg   Take 30-60 min before first meal of the day and Pepcid 20 mg one bedtime until return    Add montelukast 10 mg at bedtime   Please schedule a follow up office visit in 6 weeks, call sooner if needed

## 2015-06-01 NOTE — Progress Notes (Signed)
Subjective:    Patient ID: [name], female    DOB: Mar 22, 1960   MRN: [id number]  Brief patient profile:  54 yobf never smoker  with known hx of Chronic cough, sinusitis and ? Asthma with recurrent severe cough since her 40's "every few months"    History of Present Illness  06/10/2013 ER follow up  Was seen in ER on 5/15 for bronchitis , tx w/ Zpack and pred taper. CXR w/ no sign of acute changes, noted diffuse interstitial markings. Says got only minimally improved.  Complains of prod cough with green mucus, wheezing, dyspnea, tightness.  finished zpak and pred pak yesterday.  would like to discuss beginning nebs, says RT in ER said she should be on this at home.  Was started on QVAR last ov but not taking .   rec Augmentin 875mg  Twice daily  For 7 days  Mucinex DM Twice daily  As needed  Cough/congestion.  Fluids and rest   Begin Symbicort 80/4.31mcg 2 puffs Twice daily  - rinse after use.      09/17/2013  Acute extended  ov/Jacqulynn Shappell re: asthma vs uacs Chief Complaint  Patient presents with  . Acute Visit    Pt c/o increased SOB, wheezing and prod cough with large amounts of white sputum x 1 wk.   Sinus problems by Lucia Gaskins x 2012  years and problems with cough/wheezing x a decade. Cough is worse p lie down and try to put on cpap  Much worse x one week but was much better p last ov and when I saw her in 10/2012 and rx as rhinitis/ gerd, not asthma. She has been "maintaining " on an empty symbicort and qvar via spacer. Really only sob at rest when coughing  rec Augmentin 875 mg twice daily x 10 days Prednisone 10 mg take  4 each am x 2 days,   2 each am x 2 days,  1 each am x 2 days and stop  Pantoprazole (protonix) 40 mg   Take 30-60 min before first meal of the day and Pepcid 20 mg one bedtime until return to office - this is the best way to tell whether stomach acid is contributing to your problem.   Take mucinex dm 2  every 12 hours and supplement if needed with  tramadol 50  mg up to 2 every 4 hours  GERD diet   Symbicort 80 via spacer 80 Take 2 puffs first thing in am and then another 2 puffs about 12 hours later via spacer sample    04/07/2015 acute extended ov/Chandelle Harkey re: chronic cough / min hemoptysis  Chief Complaint  Patient presents with  . Acute Visit    Pt c/o with blood streaked sputum- started this am. She has some nasal congestion.   cough recurrent since Jan 2017 mostly dry / hacking day > noct assoc with nasal congestion but no epistaxis or excess/ purulent sputum or mucus plugs   rec Augmentin 875 mg take one pill twice daily  X 10 days -  Prednisone 10 mg take  4 each am x 2 days,   2 each am x 2 days,  1 each am x 2 days and stop  Pantoprazole (protonix) 40 mg   Take 30-60 min before first meal of the day and Pepcid 20 mg one bedtime until return to office  Take mucinex dm 2  every 12 hours and supplement if needed with  tramadol 50 mg up to 2 every 4  hours to suppress the urge to cough. Swallowing water or using ice chips/non mint and menthol containing candies (such as lifesavers or sugarless jolly ranchers) are also effective.  You should rest your voice and avoid activities that you know make you cough. GERD diet   06/01/2015  Acute extended ov/Lemoyne Nestor re: recurrent cough maint on acid suppression/ diet  Chief Complaint  Patient presents with  . Acute Visit    Pt c/o waking up in the night "throwing up mucus" x 5 days. She also c/o wheezing- using neb 2 x daily on average.   did get a lot  better p last ov, no need for albuterol neb then worse x 2 weeks  Cough is now noct/ worse with CPAP   No obvious day to day or daytime variabilty or assoc sob unless coughing or  cp or chest tightness, subjective wheeze overt sinus or hb symptoms. No unusual exp hx or h/o childhood pna/ asthma or knowledge of premature birth.   Also denies any obvious fluctuation of symptoms with weather or environmental changes or other aggravating or alleviating factors  except as outlined above   Current Medications, Allergies, Complete Past Medical History, Past Surgical History, Family History, and Social History were reviewed in [employer] record.  ROS  The following are not active complaints unless bolded sore throat, dysphagia, dental problems, itching, sneezing,  nasal congestion or excess/ purulent secretions, ear ache,   fever, chills, sweats, unintended wt loss, pleuritic or exertional cp, hemoptysis,  orthopnea pnd or leg swelling, presyncope, palpitations, heartburn, abdominal pain, anorexia, nausea, vomiting, diarrhea  or change in bowel or urinary habits, change in stools or urine, dysuria,hematuria,  rash, arthralgias, visual complaints, headache, numbness weakness or ataxia or problems with walking or coordination,  change in mood/affect or memory.            Objective:   Physical Exam  GEN: A/Ox3; pleasant , NAD, massively obese female nad    06/01/2015        294   04/07/2015        297   09/17/13 303 lb 12.8 oz (137.803 kg)  06/25/13 307 lb (139.254 kg)  06/10/13 313 lb 3.2 oz (142.067 kg)   Vital signs reviewed     HEENT:  [place]/AT,  EACs-clear, TMs-wnl, NOSE-clear, THROAT-clear, no lesions, no postnasal drip or exudate noted.   NECK:  Supple w/ fair ROM; no JVD; normal carotid impulses w/o bruits; no thyromegaly or nodules palpated; no lymphadenopathy.  RESP  Diminished BS in bases, w/o, wheezes/ rales/ or rhonchi.no accessory muscle use, no dullness to percussion  CARD:  RRR, no m/r/g  ,  1+  lower ext bilateral pitting edema/ pulses intact, no cyanosis or clubbing.  GI:   Soft & nt; nml bowel sounds; no organomegaly or masses detected.  Musco: Warm bil, no deformities or joint swelling noted.   Neuro: alert, no focal deficits noted.    Skin: Warm, no lesions or rashes      I personally reviewed images and agree with radiology impression as follows:  CT Chest   03/27/15 No acute cardiopulmonary  disease    Labs ordered/ reviewed:      Chemistry      Component Value Date/Time   NA 138 06/01/2015 1303   K 3.2* 06/01/2015 1303   CL 98 06/01/2015 1303   CO2 29 06/01/2015 1303   BUN 28* 06/01/2015 1303   CREATININE 1.04 06/01/2015 1303   CREATININE 1.07* 04/24/2015  1218      Component Value Date/Time   CALCIUM 9.6 06/01/2015 1303   ALKPHOS 77 02/19/2015 1450   AST 24 02/19/2015 1450   ALT 20 02/19/2015 1450   BILITOT 0.9 02/19/2015 1450        Lab Results  Component Value Date   WBC 13.1* 06/01/2015   HGB 11.4* 06/01/2015   HCT 33.3* 06/01/2015   MCV 89.6 06/01/2015   PLT 276.0 06/01/2015       EOS                       0.4     Lab Results  Component Value Date   TSH 1.546 03/27/2015                  Assessment & Plan:

## 2015-06-02 ENCOUNTER — Telehealth: Payer: Self-pay | Admitting: Internal Medicine

## 2015-06-02 NOTE — Telephone Encounter (Signed)
Gave appt info. She states she will need to reschedule CT. I gave her phone # to [facility] so she can call to reschedule. Nothing further needed.

## 2015-06-02 NOTE — Telephone Encounter (Signed)
lmtcb [name] ° °

## 2015-06-02 NOTE — Assessment & Plan Note (Signed)
Seems well compensated but hypokalemic, will add one extra kdur and rec f/u

## 2015-06-02 NOTE — Assessment & Plan Note (Addendum)
-   Spirometry 06/01/2015  No obstruction/ poor effort/ reproducibility  NO  06/01/2015   =  8 Allergy profile 06/01/2015 >  Eos 0.4 /  IgE   - trial of singulair 06/01/2015 >>>  Recurrent refractory cough c/w DDX of  difficult airways management almost all start with A and  include Adherence, Ace Inhibitors, Acid Reflux, Active Sinus Disease, Alpha 1 Antitripsin deficiency, Anxiety masquerading as Airways dz,  ABPA,  Allergy(esp in young), Aspiration (esp in elderly), Adverse effects of meds,  Active smokers, A bunch of PE's (a small clot burden can't cause this syndrome unless there is already severe underlying pulm or vascular dz with poor reserve) plus two Bs  = Bronchiectasis and Beta blocker use..and one C= CHF   Adherence is always the initial "prime suspect" and is a multilayered concern that requires a "trust but verify" approach in every patient - starting with knowing how to use medications, especially inhalers, correctly, keeping up with refills and understanding the fundamental difference between maintenance and prns vs those medications only taken for a very short course and then stopped and not refilled.  - def need a trust but verify approach here -  To keep things simple, I have asked the patient to first separate medicines that are perceived as maintenance, that is to be taken daily "no matter what", from those medicines that are taken on only on an as-needed basis and I have given the patient examples of both, and then return to see our NP to generate a  detailed  medication calendar which should be followed until the next physician sees the patient and updates it.    ? Allergy > check allergy profile, start singulair  ? Asthma > very very unlikely based on failure to resp to qvar/symbicort and NO so low rules out allergic asthma  ? Active sinus dz/ has h/o > check sinus CT but hold abx for now   ? Acid (or non-acid) GERD > always difficult to exclude as up to 75% of pts in some series  report no assoc GI/ Heartburn symptoms> rec continue max (24h)  acid suppression and diet restrictions/ reviewed     ? chf > appears well compensated, no evidence of cardiac asthma  I had an extended discussion with the patient reviewing all relevant studies completed to date and  lasting 25 minutes of a 40  minute extended acute office visit    Each maintenance medication was reviewed in detail including most importantly the difference between maintenance and prns and under what circumstances the prns are to be triggered using an action plan format that is not reflected in the computer generated alphabetically organized AVS.    Please see instructions for details which were reviewed in writing and the patient given a copy highlighting the part that I personally wrote and discussed at today's ov.

## 2015-06-02 NOTE — Progress Notes (Signed)
Quick Note:  Spoke with pt and notified of results per Dr. Wert. Pt verbalized understanding and denied any questions.  ______ 

## 2015-06-02 NOTE — Assessment & Plan Note (Signed)
Body mass index is 52.09    Lab Results  Component Value Date   TSH 1.546 03/27/2015     Contributing to gerd tendency/ doe/reviewed the need and the process to achieve and maintain neg calorie balance > defer f/u primary care including intermittently monitoring thyroid status

## 2015-06-02 NOTE — Telephone Encounter (Signed)
Called spoke with pt. She states that the CT that was scheduled 07/06/15 will not work. She states that due to work she will need to reschedule the CT. She explained that Wednesday are better days for her due to her work schedule. I explained to her that I would send a message to [facility] and they would contact her to reschedule. She voiced understanding and had no further questions.   Will route to [facility]

## 2015-06-05 ENCOUNTER — Inpatient Hospital Stay: Admission: RE | Admit: 2015-06-05 | Payer: No Typology Code available for payment source | Source: Ambulatory Visit

## 2015-06-05 ENCOUNTER — Other Ambulatory Visit: Payer: Self-pay | Admitting: *Deleted

## 2015-06-05 LAB — RESPIRATORY ALLERGY PROFILE REGION II ~~LOC~~
Allergen, Cedar tree, t12: <0.1 kU/L
Allergen, Comm Silver Birch, t9: <0.1 kU/L
Allergen, Cottonwood, t14: <0.1 kU/L
Allergen, Mouse Urine Protein, e78: <0.1 kU/L
Allergen, Oak,t7: <0.1 kU/L
Bermuda Grass: <0.1 kU/L
Box Elder IgE: <0.1 kU/L
Cat Dander: <0.1 kU/L
Cladosporium Herbarum: <0.1 kU/L
Cockroach: <0.1 kU/L
Dog Dander: <0.1 kU/L
Elm IgE: <0.1 kU/L
IgE (Immunoglobulin E), Serum: 35 kU/L (ref <115)
Sheep Sorrel IgE: <0.1 kU/L

## 2015-06-05 MED ORDER — ISOSORB DINITRATE-HYDRALAZINE 20-37.5 MG PO TABS: 1.0000 | ORAL_TABLET | Freq: Three times a day (TID) | ORAL | Status: DC

## 2015-06-05 NOTE — Progress Notes (Signed)
Quick Note:  Spoke with pt and notified of results per Dr. Wert. Pt verbalized understanding and denied any questions.  ______ 

## 2015-06-07 ENCOUNTER — Ambulatory Visit (INDEPENDENT_AMBULATORY_CARE_PROVIDER_SITE_OTHER)
Admission: RE | Admit: 2015-06-07 | Discharge: 2015-06-07 | Disposition: A | Discharge status: Home / Self Care | Payer: No Typology Code available for payment source | Source: Ambulatory Visit | Attending: Internal Medicine | Admitting: Internal Medicine

## 2015-06-07 DIAGNOSIS — R05 Cough: Secondary | ICD-10-CM | POA: Diagnosis not present

## 2015-06-07 NOTE — Progress Notes (Signed)
Quick Note:  Spoke with pt and notified of results per Dr. Wert. Pt verbalized understanding and denied any questions.  ______ 

## 2015-06-09 ENCOUNTER — Telehealth: Payer: Self-pay

## 2015-06-09 NOTE — Telephone Encounter (Signed)
Patient was ordered a cologuard test on 04-04-2015 during her office visit. This test was cancelled by [employer] due to inactivity.

## 2015-06-09 NOTE — Telephone Encounter (Signed)
Please inform patient

## 2015-06-09 NOTE — Telephone Encounter (Signed)
Pt contacted. She state she will call the [phone] number provided and get them to resent her sample kit.  

## 2015-06-12 ENCOUNTER — Telehealth: Payer: Self-pay | Admitting: Cardiology

## 2015-06-12 NOTE — Telephone Encounter (Signed)
New MEssage  Pt c/o feeling "hot" and sluggish. Pt requested to speak w/ RN. Please call back and discuss.

## 2015-06-12 NOTE — Telephone Encounter (Signed)
Follow up   Pt is calling because she wants a new prescription for a generic brand of Bidil unknown of how many mg  Please call pt

## 2015-06-12 NOTE — Telephone Encounter (Signed)
SPOKE TO PATIENT - SHE STATES MEDICATION FOR BIDIL IS $300 FOR A 90 DAY SUPPLY SHE ONLY HAS 1 DAY SUPPLY LEFT SHE STATES SHE WILL NEED THE GENERIC DOSE TO AFFORD MEDICATION.   [place] HO T, BLURRY VISION ON Friday  and Saturday - patient states she increase intake by using some Pedialyte and Gatorade Weight is is till under 300 lbs - today's weight is 291 , yesterday 290  she states she has been taking furosemide and used metolazone on Sunday. When she started urine was dark but day it is clear. Patient states she is on fluid restriction of 3 bottles a day.  Will defer  Dr Percival Spanish to change BIDIL- AND IF ANY CHANGES TO MEDICATION

## 2015-06-13 MED ORDER — ISOSORBIDE DINITRATE 20 MG PO TABS: 20.0000 mg | ORAL_TABLET | Freq: Three times a day (TID) | ORAL | Status: DC

## 2015-06-13 MED ORDER — HYDRALAZINE HCL 25 MG PO TABS: 37.5000 mg | ORAL_TABLET | Freq: Three times a day (TID) | ORAL | Status: DC

## 2015-06-13 NOTE — Telephone Encounter (Signed)
Patient has copay card good for $25/month, but her insurance requires she use mail order and they won't honor card. Will switch to isosorbide and hydralazine tid until fall when patient states she will have new insurance

## 2015-06-13 NOTE — Telephone Encounter (Signed)
I will send to our pharmacy to see if they can help.

## 2015-06-13 NOTE — Telephone Encounter (Signed)
Follow Up   Pt is calling to follow up on the previous message. Pt states that today is her last day taking the medication. Please call

## 2015-06-26 ENCOUNTER — Other Ambulatory Visit: Payer: Self-pay | Admitting: Cardiology

## 2015-06-28 ENCOUNTER — Ambulatory Visit: Payer: No Typology Code available for payment source | Admitting: Internal Medicine

## 2015-07-03 ENCOUNTER — Telehealth: Payer: Self-pay | Admitting: Cardiology

## 2015-07-03 ENCOUNTER — Telehealth: Payer: Self-pay | Admitting: Internal Medicine

## 2015-07-03 ENCOUNTER — Other Ambulatory Visit: Payer: Self-pay | Admitting: Physician Assistant

## 2015-07-03 MED ORDER — ASPIRIN 81 MG PO TBEC: 81.0000 mg | DELAYED_RELEASE_TABLET | Freq: Every day | ORAL | Status: DC

## 2015-07-03 MED ORDER — ASPIRIN 81 MG PO TBEC
81.0000 mg | DELAYED_RELEASE_TABLET | Freq: Every day | ORAL | Status: DC
Start: 2015-07-03 — End: 2015-07-03

## 2015-07-03 NOTE — Telephone Encounter (Signed)
Can add in daily MiraLAX, as much as needed, to achieve daily bowel movement

## 2015-07-03 NOTE — Telephone Encounter (Signed)
Returned call to pt. She was requesting to have refills for Aspirin sent to her pharmacy. Rx sent to verified pharmacy.

## 2015-07-03 NOTE — Telephone Encounter (Signed)
Spoke with pt and she is aware.

## 2015-07-03 NOTE — Telephone Encounter (Signed)
New Message  Pt requested to speak w/ RN concerning her Rx for Aspirin. Please call back and discuss.

## 2015-07-03 NOTE — Telephone Encounter (Signed)
Patient of Dr. Henrene Pastor Sent to me by mistake. I will forward

## 2015-07-03 NOTE — Telephone Encounter (Signed)
Left message for pt to call back.  Pt states her PCP prescribed linzess 145 daily and that she has been on this for about a month and a half. States it has stopped working and she wants to know what to do. Asked if pt had called her PCP and she states she was told to call our office. Pt states she took MOM on Saturday and did have a BM on Sunday. Please advise.

## 2015-07-27 ENCOUNTER — Other Ambulatory Visit: Payer: Self-pay | Admitting: Internal Medicine

## 2015-07-31 ENCOUNTER — Encounter: Payer: Self-pay | Admitting: Internal Medicine

## 2015-07-31 ENCOUNTER — Ambulatory Visit (INDEPENDENT_AMBULATORY_CARE_PROVIDER_SITE_OTHER): Payer: No Typology Code available for payment source | Admitting: Internal Medicine

## 2015-07-31 VITALS — BP 122/84 | HR 83 | Ht 63.0 in | Wt 295.0 lb

## 2015-07-31 DIAGNOSIS — J45991 Cough variant asthma: Secondary | ICD-10-CM

## 2015-07-31 DIAGNOSIS — R05 Cough: Secondary | ICD-10-CM

## 2015-07-31 DIAGNOSIS — R058 Other specified cough: Secondary | ICD-10-CM

## 2015-07-31 MED ORDER — PREDNISONE 10 MG PO TABS: ORAL_TABLET | ORAL | Status: DC

## 2015-07-31 MED ORDER — BUDESONIDE-FORMOTEROL FUMARATE 80-4.5 MCG/ACT IN AERO: INHALATION_SPRAY | RESPIRATORY_TRACT | Status: DC

## 2015-07-31 MED ORDER — TRAMADOL HCL 50 MG PO TABS: 50.0000 mg | ORAL_TABLET | Freq: Four times a day (QID) | ORAL | Status: DC | PRN

## 2015-07-31 NOTE — Progress Notes (Signed)
Subjective:   Patient ID: [name], female    DOB: 17-Jun-1960   MRN: [id number]  Brief patient profile:  11 yobf never smoker  with known hx of Chronic cough, sinusitis and ? Asthma with recurrent severe cough since her 40's "every few months"    History of Present Illness  06/10/2013 ER follow up  Was seen in ER on 5/15 for bronchitis , tx w/ Zpack and pred taper. CXR w/ no sign of acute changes, noted diffuse interstitial markings. Says got only minimally improved.  Complains of prod cough with green mucus, wheezing, dyspnea, tightness.  finished zpak and pred pak yesterday.  would like to discuss beginning nebs, says RT in ER said she should be on this at home.  Was started on QVAR last ov but not taking .   rec Augmentin 875mg  Twice daily  For 7 days  Mucinex DM Twice daily  As needed  Cough/congestion.  Fluids and rest   Begin Symbicort 80/4.30mcg 2 puffs Twice daily  - rinse after use.      09/17/2013  Acute extended  ov/Querida Beretta re: asthma vs uacs Chief Complaint  Patient presents with  . Acute Visit    Pt c/o increased SOB, wheezing and prod cough with large amounts of white sputum x 1 wk.   Sinus problems by Lucia Gaskins x 2012  years and problems with cough/wheezing x a decade. Cough is worse p lie down and try to put on cpap  Much worse x one week but was much better p last ov and when I saw her in 10/2012 and rx as rhinitis/ gerd, not asthma. She has been "maintaining " on an empty symbicort and qvar via spacer. Really only sob at rest when coughing  rec Augmentin 875 mg twice daily x 10 days Prednisone 10 mg take  4 each am x 2 days,   2 each am x 2 days,  1 each am x 2 days and stop  Pantoprazole (protonix) 40 mg   Take 30-60 min before first meal of the day and Pepcid 20 mg one bedtime until return to office - this is the best way to tell whether stomach acid is contributing to your problem.   Take mucinex dm 2  every 12 hours and supplement if needed with  tramadol 50 mg  up to 2 every 4 hours  GERD diet   Symbicort 80 via spacer 80 Take 2 puffs first thing in am and then another 2 puffs about 12 hours later via spacer sample    04/07/2015 acute extended ov/Eldredge Veldhuizen re: chronic cough / min hemoptysis  Chief Complaint  Patient presents with  . Acute Visit    Pt c/o with blood streaked sputum- started this am. She has some nasal congestion.   cough recurrent since Jan 2017 mostly dry / hacking day > noct assoc with nasal congestion but no epistaxis or excess/ purulent sputum or mucus plugs   rec Augmentin 875 mg take one pill twice daily  X 10 days -  Prednisone 10 mg take  4 each am x 2 days,   2 each am x 2 days,  1 each am x 2 days and stop  Pantoprazole (protonix) 40 mg   Take 30-60 min before first meal of the day and Pepcid 20 mg one bedtime until return to office  Take mucinex dm 2  every 12 hours and supplement if needed with  tramadol 50 mg up to 2 every 4 hours  to suppress the urge to cough. GERD diet   06/01/2015  Acute extended ov/Maylon Sailors re: recurrent cough maint on acid suppression/ diet  Chief Complaint  Patient presents with  . Acute Visit    Pt c/o waking up in the night "throwing up mucus" x 5 days. She also c/o wheezing- using neb 2 x daily on average.   did get a lot  better p last ov, no need for albuterol neb then worse x 2 weeks prior to OV   Cough is now noct/ worse with CPAP  rec schedule sinus CT> min debris sphenoid sinus  Please remember to go to the lab   department  Prednisone 10 mg take  4 each am x 2 days,   2 each am x 2 days,  1 each am x 2 days and stop  Continue Pantoprazole (protonix) 40 mg   Take 30-60 min before first meal of the day and Pepcid 20 mg one bedtime until return   Add montelukast 10 mg at bedtime     07/31/2015  f/u ov/Sharah Finnell re: chronic cough ? transiently better p prednisone x 2 weeks / off symbicort / on singulair Chief Complaint  Patient presents with  . Follow-up    Increased cough and SOB x 2 wks.  Cough is prod with yellow to white sputum "feels like there is something in my throat".  She is using albuterol neb 2 x daily on average.   cough onset was Jan 2017 / ? Only really better until tramadol runs out?   No obvious day to day or daytime variabilty or assoc sob unless coughing or  cp or chest tightness, subjective wheeze overt sinus or hb symptoms. No unusual exp hx or h/o childhood pna/ asthma or knowledge of premature birth.   Also denies any obvious fluctuation of symptoms with weather or environmental changes or other aggravating or alleviating factors except as outlined above   Current Medications, Allergies, Complete Past Medical History, Past Surgical History, Family History, and Social History were reviewed in [employer] record.  ROS  The following are not active complaints unless bolded sore throat, dysphagia, dental problems, itching, sneezing,  nasal congestion or excess/ purulent secretions, ear ache,   fever, chills, sweats, unintended wt loss, pleuritic or exertional cp, hemoptysis,  orthopnea pnd or leg swelling, presyncope, palpitations, heartburn, abdominal pain, anorexia, nausea, vomiting, diarrhea  or change in bowel or urinary habits, change in stools or urine, dysuria,hematuria,  rash, arthralgias, visual complaints, headache, numbness weakness or ataxia or problems with walking or coordination,  change in mood/affect or memory.            Objective:   Physical Exam  GEN: A/Ox3; pleasant , NAD, massively obese female nad    06/01/2015        294  > 07/31/2015   295  04/07/2015        297   09/17/13 303 lb 12.8 oz (137.803 kg)  06/25/13 307 lb (139.254 kg)  06/10/13 313 lb 3.2 oz (142.067 kg)   Vital signs reviewed     HEENT:  [place]/AT,  EACs-clear, TMs-wnl, NOSE-clear, THROAT-clear, no lesions, no postnasal drip or exudate noted.   NECK:  Supple w/ fair ROM; no JVD; normal carotid impulses w/o bruits; no thyromegaly or nodules palpated;  no lymphadenopathy.  RESP  Diminished BS in bases, w/o, wheezes/ rales/ or rhonchi.no accessory muscle use, no dullness to percussion  CARD:  RRR, no m/r/g  ,  1+  lower ext bilateral pitting edema/ pulses intact, no cyanosis or clubbing.  GI:   Soft & nt; nml bowel sounds; no organomegaly or masses detected.  Musco: Warm bil, no deformities or joint swelling noted.   Neuro: alert, no focal deficits noted.    Skin: Warm, no lesions or rashes      I personally reviewed images and agree with radiology impression as follows:  CT Chest   03/27/15 No acute cardiopulmonary disease    Labs ordered/ reviewed:      Chemistry      Component Value Date/Time   NA 138 06/01/2015 1303   K 3.2* 06/01/2015 1303   CL 98 06/01/2015 1303   CO2 29 06/01/2015 1303   BUN 28* 06/01/2015 1303   CREATININE 1.04 06/01/2015 1303   CREATININE 1.07* 04/24/2015 1218      Component Value Date/Time   CALCIUM 9.6 06/01/2015 1303   ALKPHOS 77 02/19/2015 1450   AST 24 02/19/2015 1450   ALT 20 02/19/2015 1450   BILITOT 0.9 02/19/2015 1450        Lab Results  Component Value Date   WBC 13.1* 06/01/2015   HGB 11.4* 06/01/2015   HCT 33.3* 06/01/2015   MCV 89.6 06/01/2015   PLT 276.0 06/01/2015       EOS                       0.4     Lab Results  Component Value Date   TSH 1.546 03/27/2015                  Assessment & Plan:

## 2015-07-31 NOTE — Patient Instructions (Addendum)
Plan A = Automatic =  symbicort 80 Take 2 puffs first thing in am and then another 2 puffs about 12 hours later - add the spacer if needed - stop it if the cough comes back again while on symbicort  Prednisone 10 mg take  4 each am x 2 days,   2 each am x 2 days,  1 each am x 2 days and stop   Plan B = Backup Only use your albuterol as a rescue medication to be used if you can't catch your breath by resting or doing a relaxed purse lip breathing pattern.  - The less you use it, the better it will work when you need it. - Ok to use the inhaler up to 2 puffs  every 4 hours if you must but call for appointment if use goes up over your usual need - Don't leave home without it !!  (think of it like the spare tire for your car)    For drainage / throat tickle try take CHLORPHENIRAMINE  4 mg - take one every 4 hours as needed - available over the counter- may cause drowsiness so start with just a bedtime dose or two and see how you tolerate it before trying in daytime    Take delsym two tsp every 12 hours and supplement if needed with  tramadol 50 mg up to 2 every 4 hours to suppress the urge to cough. Swallowing water or using ice chips/non mint and menthol containing candies (such as lifesavers or sugarless jolly ranchers) are also effective.  You should rest your voice and avoid activities that you know make you cough.  Once you have eliminated the cough for 3 straight days try stopping the tramadol first,  then the delsym as tolerated.    See [name] NP 4 weeks with all your medications, even over the counter meds, separated in two separate bags, the ones you take no matter what vs the ones you stop once you feel better and take only as needed when you feel you need them.   [name]  will generate for you a new user friendly medication calendar that will put [place] all on the same page re: your medication use.     Without this process, it simply isn't possible to assure that we are providing  your outpatient  care  with  the attention to detail we feel you deserve.   If we cannot assure that you're getting that kind of care,  then we cannot manage your problem effectively from this clinic.  Once you have seen [name] and we are sure that we're all on the same page with your medication use she will arrange follow up with me.

## 2015-08-03 ENCOUNTER — Telehealth: Payer: Self-pay | Admitting: Internal Medicine

## 2015-08-03 NOTE — Telephone Encounter (Signed)
Received PA for Symbicort 80/4.53mcg. Called [facility] >> [phone]. Pt ID #: [id number]. Spoke with Mo. PA has been initiated and sent for clinical review. We will have an approval or denial in 24-48 hours. Will route to [place] for follow up.

## 2015-08-05 NOTE — Assessment & Plan Note (Signed)
07/31/2015  After extensive coaching HFA effectiveness =    25% > try symbicort 80 2bid and add spacer   If cough flares on symbicort when not taking tramadol this would be good evidence she does not have asthma but would probably need MCT to be sure.

## 2015-08-05 NOTE — Assessment & Plan Note (Signed)
Body mass index is 52.27 kg/(m^2).  Lab Results  Component Value Date   TSH 1.546 03/27/2015     Contributing to gerd tendency/ doe/reviewed the need and the process to achieve and maintain neg calorie balance > defer f/u primary care including intermittently monitoring thyroid status

## 2015-08-05 NOTE — Assessment & Plan Note (Addendum)
NO  06/01/2015   =  8 - Spirometry 06/01/2015  No obstruction/ poor effort/ reproducibility  Allergy profile 06/01/2015 >  Eos 0.4 /  IgE  35 neg RAST - trial of singulair 06/01/2015 >>> - Sinus CT 06/07/2015 > No definite sinusitis. Only a minimal amount of debris is noted within the right partition of the sphenoid sinu  Still not clear as to what extent this is uacs vs asthma vs both but we won't get the answer if she doesn't follow the instructions more diligently and stop the tramadol after 3-5 days of complete suppression   I had an extended discussion with the patient reviewing all relevant studies completed to date and  lasting 15 to 20 minutes of a 25 minute visit on the following ongoing concerns:   The standardized cough guidelines published in Chest by Lissa Morales in 2006 are still the best available and consist of a multiple step process (up to 12!) , not a single office visit,  and are intended  to address this problem logically,  with an alogrithm dependent on response to empiric treatment at  each progressive step  to determine a specific diagnosis with  minimal addtional testing needed. Therefore if adherence is an issue or can't be accurately verified,  it's very unlikely the standard evaluation and treatment will be successful here.    Furthermore, response to therapy (other than acute cough suppression, which should only be used short term with avoidance of narcotic containing cough syrups if possible), can be a gradual process for which the patient is not likely to  perceive immediate benefit.  Unlike going to an eye doctor where the best perscription is almost always the first one and is immediately effective, this is almost never the case in the management of chronic cough syndromes. Therefore the patient needs to commit up front to consistently adhere to recommendations  for up to 6 weeks of therapy directed at the likely underlying problem(s) before the response can be reasonably  evaluated.     [name]

## 2015-08-08 MED ORDER — MOMETASONE FURO-FORMOTEROL FUM 100-5 MCG/ACT IN AERO
2.0000 | INHALATION_SPRAY | Freq: Two times a day (BID) | RESPIRATORY_TRACT | Status: DC
Start: 2015-08-08 — End: 2015-08-25

## 2015-08-08 NOTE — Telephone Encounter (Signed)
Called [facility] and spoke with Katharine Look. Symbicort has been denied. Pt must try and fail one of the covered alternatives. Covered alternatives are Advair, Breo and Dulera.  MW - please advise. Thanks.

## 2015-08-08 NOTE — Telephone Encounter (Signed)
Spoke with pt and advised that Symbicort denied. MW requests to change to [facility]. Pt agrees. Rx sent. Nothing further needed.

## 2015-08-08 NOTE — Telephone Encounter (Signed)
dulera 100 Take 2 puffs first thing in am and then another 2 puffs about 12 hours later.

## 2015-08-11 ENCOUNTER — Other Ambulatory Visit: Payer: Self-pay | Admitting: Family Medicine

## 2015-08-11 DIAGNOSIS — Z1231 Encounter for screening mammogram for malignant neoplasm of breast: Secondary | ICD-10-CM

## 2015-08-14 ENCOUNTER — Other Ambulatory Visit: Payer: Self-pay | Admitting: Family Medicine

## 2015-08-14 DIAGNOSIS — R229 Localized swelling, mass and lump, unspecified: Secondary | ICD-10-CM

## 2015-08-16 ENCOUNTER — Ambulatory Visit
Admission: RE | Admit: 2015-08-16 | Discharge: 2015-08-16 | Disposition: A | Discharge status: Home / Self Care | Payer: 59 | Source: Ambulatory Visit | Attending: Family Medicine | Admitting: Family Medicine

## 2015-08-16 ENCOUNTER — Other Ambulatory Visit: Payer: No Typology Code available for payment source

## 2015-08-16 ENCOUNTER — Ambulatory Visit: Payer: No Typology Code available for payment source

## 2015-08-16 DIAGNOSIS — R229 Localized swelling, mass and lump, unspecified: Secondary | ICD-10-CM

## 2015-08-23 ENCOUNTER — Telehealth: Payer: Self-pay | Admitting: Hematology and Oncology

## 2015-08-23 ENCOUNTER — Encounter: Payer: Self-pay | Admitting: Hematology and Oncology

## 2015-08-23 NOTE — Telephone Encounter (Signed)
Appointment scheduled with Alvy Bimler on 8/4 at 1:30pm. Patient awware to arrive 30 minutes early. Demographics verified. Letter to the referring.

## 2015-08-23 NOTE — Progress Notes (Signed)
HPI  Pt presents for follow up of follow-up of acute on chronic diastolic HF.  The patient was hospitalized in March with acute on chronic heart failure.  This was probably multifactorial with some thought that she also had bronchitis.  She says that since that time she has had some dyspnea. She's had mostly cough with mucous that she has not been able to clear.    She is seeing Dr. Melvyn [name].  She wonders if her fluid medicines working as she is up about 5 pounds. She doesn't think she urinates as much as she should.  She is watching her fluid intake and her salt intake.she is not describing chest pressure, neck or arm discomfort. She's not having any palpitations, presyncope or syncope.   Allergies  Allergen Reactions  . Sulfonamide Derivatives Anaphylaxis, Swelling and Rash    Swelling tongue and ear   . Doxycycline Nausea And Vomiting  . Latex Hives  . Ciprofloxacin Rash  . Hydrocortisone Rash  . Neomycin Rash    Current Outpatient Prescriptions  Medication Sig Dispense Refill  . albuterol (PROVENTIL) (2.5 MG/3ML) 0.083% nebulizer solution Take 3 mLs (2.5 mg total) by nebulization every 6 (six) hours as needed for wheezing or shortness of breath (Dx: J44.1). 75 mL 3  . aspirin 81 MG EC tablet Take 1 tablet (81 mg total) by mouth daily. 90 tablet 3  . dextromethorphan-guaiFENesin (MUCINEX DM) 30-600 MG 12hr tablet Take 1 tablet by mouth 2 (two) times daily as needed.     . famotidine (PEPCID) 20 MG tablet One at bedtime (Patient taking differently: prn)    . hydrALAZINE (APRESOLINE) 25 MG tablet Take 1.5 tablets (37.5 mg total) by mouth 3 (three) times daily. 135 tablet 5  . isosorbide dinitrate (ISORDIL) 20 MG tablet Take 1 tablet (20 mg total) by mouth 3 (three) times daily. 90 tablet 5  . losartan (COZAAR) 100 MG tablet TAKE 1 TABLET (100 MG TOTAL) BY MOUTH DAILY. 90 tablet 0  . metolazone (ZAROXOLYN) 2.5 MG tablet Take 1 tablet (2.5 mg total) by mouth daily as needed. see notes  (Patient taking differently: Take 2.5 mg by mouth daily as needed (for fluid). ) 90 tablet 1  . montelukast (SINGULAIR) 10 MG tablet Take 1 tablet (10 mg total) by mouth at bedtime. (Patient taking differently: Take 10 mg by mouth as needed. ) 30 tablet 11  . pantoprazole (PROTONIX) 40 MG tablet Take 1 tablet (40 mg total) by mouth daily. Take 30-60 min before first meal of the day 30 tablet 2  . potassium chloride SA (KLOR-CON M20) 20 MEQ tablet 2 tablets three times daily and one extra tablet as needed 217 tablet 3  . spironolactone (ALDACTONE) 25 MG tablet Take 0.5 tablets (12.5 mg total) by mouth 2 (two) times daily. 60 tablet 3  . topiramate (TOPAMAX) 25 MG tablet Take 25 mg by mouth 2 (two) times daily as needed (for headaches).     . traMADol (ULTRAM) 50 MG tablet Take 1 tablet (50 mg total) by mouth every 6 (six) hours as needed for moderate pain. 40 tablet 0  . torsemide (DEMADEX) 20 MG tablet Take 2 tablets (40 mg total) by mouth 2 (two) times daily. 120 tablet 9   No current facility-administered medications for this visit.     Past Medical History:  Diagnosis Date  . Acute on chronic diastolic CHF (congestive heart failure) ([place]) 04/11/2014  . Acute suppurative otitis media without spontaneous rupture of eardrum 02/04/2007  Centricity Description: OTITIS MEDIA, SUPPURATIVE, ACUTE, BILATERAL Qualifier: Diagnosis of  By: [name] MD, [name]  Centricity Description: OTITIS MEDIA, SUPPURATIVE, ACUTE Qualifier: Diagnosis of  By: [name] MD, [name]   . Allergic rhinitis   . CAD (coronary artery disease)    LHC (11/29/1999):  EF 60%; no significant CAD.  [name] Carpal tunnel syndrome   . Carpal tunnel syndrome 06/09/2006   Qualifier: Diagnosis of  By: Marca Ancona RMA, Lucy    . Chronic diastolic CHF (congestive heart failure) ([place])    a. Echo (01/21/11):  Vigorous LVF, EF 65-70%, no RWMA, Gr 2 DD. ;  b.  Echo (12/15): Moderate LVH, EF 123456, grade 2 diastolic dysfunction, mild LAE, normal RV function    . Common migraine   . Diastolic heart failure ([place])   . Diverticulitis   . Hepatic steatosis   . Hepatomegaly   . Hiatal hernia   . Hyperlipidemia   . Hypertension   . IBS (irritable bowel syndrome)   . Internal hemorrhoids   . Morbid obesity ([place])   . OSA (obstructive sleep apnea)    CPAP dependent  . PNEUMONIA, ORGAN UNSPECIFIED 03/01/2009   Qualifier: Diagnosis of  By: [name] CMA, [name]    . Sinusitis, chronic 03/10/2012   CT sinuses 02/2012:  Chronic sinusitis with small A/F levels >> rx by ENT with prolonged augmentin F/u ct sinuses 04/2012:  Persistent sinus thickening >> rx with levaquin and clinda for 30 days.  Told to return for sinus scan in 6 weeks >> no showed for followup visit.  CT sinuses 11/2013:  Acute and chronic sinusitis noted.      Past Surgical History:  Procedure Laterality Date  . CESAREAN SECTION    . NASAL SINUS SURGERY    . PARTIAL HYSTERECTOMY    . TONSILLECTOMY    . TUBAL LIGATION    . UVULOPALATOPHARYNGOPLASTY      ROS:  As stated in the HPI and negative for all other systems.  PHYSICAL EXAM BP 137/78   Pulse 85   Ht 5\' 3"  (1.6 m)   Wt 297 lb 9.6 oz (135 kg)   BMI 52.72 kg/m  GENERAL:  Well appearing NECK:  No jugular venous distention, waveform within normal limits, carotid upstroke brisk and symmetric, no bruits, no thyromegaly LUNGS:  Clear to auscultation bilaterally BACK:  No CVA tenderness CHEST:  Unremarkable HEART:  PMI not displaced or sustained,S1 and S2 within normal limits, no S3, no S4, no clicks, no rubs, no murmurs ABD:  Obese, positive bowel sounds normal in frequency in pitch, no bruits, no rebound, no guarding, no midline pulsatile mass, no hepatomegaly, no splenomegaly EXT:  2 plus pulses throughout, no edema, no cyanosis no clubbing    ASSESSMENT AND PLAN   Chronic diastolic heart failure I reviewed her hospital records.  I am going to change her to Torsemide and stop the Lasix.  I did see that her potassium was  low at the last check but her potassium was increased.  I will check a BMET today and again in two weeks.  She will continue with her daily weights.    Essential hypertension  The blood pressure is at target. No change in medications is indicated. We will continue with therapeutic lifestyle changes (TLC).  Obstructive sleep apnea I instructed her to follow-up with pulmonary who has been managing this.  Morbid Obesity She understands the need to lose weight with diet and exercise.    Hypokalemia She did have this  in the hospital and I will check a basic metabolic profile.  Hospital records reviewed

## 2015-08-25 ENCOUNTER — Encounter: Payer: Self-pay | Admitting: Cardiology

## 2015-08-25 ENCOUNTER — Encounter: Payer: Self-pay | Admitting: Hematology and Oncology

## 2015-08-25 ENCOUNTER — Ambulatory Visit (INDEPENDENT_AMBULATORY_CARE_PROVIDER_SITE_OTHER): Payer: 59 | Admitting: Cardiology

## 2015-08-25 ENCOUNTER — Telehealth: Payer: Self-pay | Admitting: Cardiology

## 2015-08-25 ENCOUNTER — Ambulatory Visit (HOSPITAL_BASED_OUTPATIENT_CLINIC_OR_DEPARTMENT_OTHER): Payer: 59 | Admitting: Hematology and Oncology

## 2015-08-25 VITALS — BP 137/78 | HR 85 | Ht 63.0 in | Wt 297.6 lb

## 2015-08-25 DIAGNOSIS — Z79899 Other long term (current) drug therapy: Secondary | ICD-10-CM

## 2015-08-25 DIAGNOSIS — J45991 Cough variant asthma: Secondary | ICD-10-CM | POA: Diagnosis not present

## 2015-08-25 DIAGNOSIS — R0602 Shortness of breath: Secondary | ICD-10-CM | POA: Diagnosis not present

## 2015-08-25 DIAGNOSIS — E876 Hypokalemia: Secondary | ICD-10-CM

## 2015-08-25 DIAGNOSIS — D72829 Elevated white blood cell count, unspecified: Secondary | ICD-10-CM | POA: Diagnosis not present

## 2015-08-25 DIAGNOSIS — J329 Chronic sinusitis, unspecified: Secondary | ICD-10-CM

## 2015-08-25 DIAGNOSIS — I5032 Chronic diastolic (congestive) heart failure: Secondary | ICD-10-CM

## 2015-08-25 DIAGNOSIS — I10 Essential (primary) hypertension: Secondary | ICD-10-CM

## 2015-08-25 LAB — BASIC METABOLIC PANEL
BUN: 12 mg/dL (ref 7–25)
CHLORIDE: 100 mmol/L (ref 98–110)
CO2: 24 mmol/L (ref 20–31)
Calcium: 9 mg/dL (ref 8.6–10.4)
Creat: 0.81 mg/dL (ref 0.50–1.05)
Glucose, Bld: 108 mg/dL — ABNORMAL HIGH (ref 65–99)
POTASSIUM: 3.1 mmol/L — AB (ref 3.5–5.3)
SODIUM: 140 mmol/L (ref 135–146)

## 2015-08-25 MED ORDER — TORSEMIDE 20 MG PO TABS: 40.0000 mg | ORAL_TABLET | Freq: Two times a day (BID) | ORAL | 9 refills | Status: DC

## 2015-08-25 MED ORDER — LOSARTAN POTASSIUM 100 MG PO TABS: 100.0000 mg | ORAL_TABLET | Freq: Every day | ORAL | 1 refills | Status: DC

## 2015-08-25 MED ORDER — LOSARTAN POTASSIUM 100 MG PO TABS
100.0000 mg | ORAL_TABLET | Freq: Every day | ORAL | 1 refills | Status: DC
Start: 2015-08-25 — End: 2015-08-25

## 2015-08-25 NOTE — Telephone Encounter (Signed)
rx sent to pharmacy

## 2015-08-25 NOTE — Patient Instructions (Signed)
Medication Instructions:  STOP Furosemide and START Torsemide 40 mg twice a day  Labwork: BMP today and BMP in 2 weeks  Testing/Procedures: NONE  Follow-Up: Your physician recommends that you schedule a follow-up appointment in: 2 Months with Suanne Marker or Lurena Joiner   Any Other Special Instructions Will Be Listed Below (If Applicable).   If you need a refill on your cardiac medications before your next appointment, please call your pharmacy.

## 2015-08-25 NOTE — Telephone Encounter (Signed)
New Message   *STAT* If patient is at the pharmacy, call can be transferred to refill team.   1. Which medications need to be refilled? (please list name of each medication and dose if known) Losartan  2. Which pharmacy/location (including street and city if local pharmacy) is medication to be sent to? [facility] [place], [phone]  3. Do they need a 30 day or 90 day supply?  90 day  They're stating there aren't any more refills left, pt has only 5-6 pills left.

## 2015-08-25 NOTE — Assessment & Plan Note (Signed)
She have chronic sinusitis and had recent CT done. She follows with ENT surgeon who is managing her sinus drainage conservatively This is a most likely cause of her chronic, recurrent leukocytosis

## 2015-08-25 NOTE — Assessment & Plan Note (Signed)
She has chronic intermittent leukocytosis likely reactive in nature from infection and chronic nasal drainage. This is a benign, reactive and she does not need further follow-up or workup

## 2015-08-25 NOTE — Assessment & Plan Note (Signed)
She was evaluated in the pulmonology office and have chronic reflux/GERD which probably exacerbated her cough. Again, as above, recommend proton pump inhibitor

## 2015-08-25 NOTE — Progress Notes (Signed)
[place] NOTE  Patient Care Team: [name] Arn Medal, MD as PCP - General (Family Medicine)  CHIEF COMPLAINTS/PURPOSE OF CONSULTATION:  Chronic intermittent leukocytosis  HISTORY OF PRESENTING ILLNESS:  [name] 55 y.o. female is here because of elevated WBC.  She was found to have abnormal CBC from routine blood work monitoring. According to electronic records, she have chronic intermittent leukocytosis since October 2011. Her blood count fluctuated from 7.8-22.9 She denies recent infection. The last prescription antibiotics was less than 3 months ago There is reported symptoms of chronic sinus congestion & nasal drainage and chronic cough. She denies urinary frequency/urgency or dysuria, diarrhea, joint swelling/pain or abnormal skin rash. Her cough is worse at nighttime when she tried July down. It is typically nonproductive in nature. She has extensive evaluation on this. She had CT imaging study done several months ago which show evidence of hiatal hernia but no significant lung infiltrate She had CT sinus done recently which show evidence of sinusitis  She had no prior history or diagnosis of cancer. Her age appropriate screening programs are up-to-date. The patient has no prior diagnosis of autoimmune disease and was not prescribed corticosteroids related products. She is a non-smoker  MEDICAL HISTORY:  Past Medical History:  Diagnosis Date  . Acute on chronic diastolic CHF (congestive heart failure) ([place]) 04/11/2014  . Acute suppurative otitis media without spontaneous rupture of eardrum 02/04/2007   Centricity Description: OTITIS MEDIA, SUPPURATIVE, ACUTE, BILATERAL Qualifier: Diagnosis of  By: [name] MD, [name]  Centricity Description: OTITIS MEDIA, SUPPURATIVE, ACUTE Qualifier: Diagnosis of  By: [name] MD, [name]   . Allergic rhinitis   . CAD (coronary artery disease)    LHC (11/29/1999):  EF 60%; no significant CAD.  [name] Carpal tunnel syndrome    . Carpal tunnel syndrome 06/09/2006   Qualifier: Diagnosis of  By: Marca Ancona RMA, Lucy    . Chronic diastolic CHF (congestive heart failure) ([place])    a. Echo (01/21/11):  Vigorous LVF, EF 65-70%, no RWMA, Gr 2 DD. ;  b.  Echo (12/15): Moderate LVH, EF 123456, grade 2 diastolic dysfunction, mild LAE, normal RV function  . Common migraine   . Diastolic heart failure ([place])   . Diverticulitis   . Hepatic steatosis   . Hepatomegaly   . Hiatal hernia   . Hyperlipidemia   . Hypertension   . IBS (irritable bowel syndrome)   . Internal hemorrhoids   . Morbid obesity ([place])   . OSA (obstructive sleep apnea)    CPAP dependent  . PNEUMONIA, ORGAN UNSPECIFIED 03/01/2009   Qualifier: Diagnosis of  By: [name] CMA, [name]    . Sinusitis, chronic 03/10/2012   CT sinuses 02/2012:  Chronic sinusitis with small A/F levels >> rx by ENT with prolonged augmentin F/u ct sinuses 04/2012:  Persistent sinus thickening >> rx with levaquin and clinda for 30 days.  Told to return for sinus scan in 6 weeks >> no showed for followup visit.  CT sinuses 11/2013:  Acute and chronic sinusitis noted.      SURGICAL HISTORY: Past Surgical History:  Procedure Laterality Date  . CESAREAN SECTION    . NASAL SINUS SURGERY    . PARTIAL HYSTERECTOMY    . TONSILLECTOMY    . TUBAL LIGATION    . UVULOPALATOPHARYNGOPLASTY      SOCIAL HISTORY: Social History   Social History  . Marital status: Widowed    Spouse name: N/A  . Number of children: 2  .  Years of education: N/A   Occupational History  . Retired    Social History Main Topics  . Smoking status: Never Smoker  . Smokeless tobacco: Never Used  . Alcohol use No  . Drug use: No  . Sexual activity: Not on file   Other Topics Concern  . Not on file   Social History Narrative   Charity fundraiser, Oceanographer.   Widowed, lives with son.    FAMILY HISTORY: Family History  Problem Relation Age of Onset  . Heart disease Mother   . Heart attack Mother    . Hypertension Mother   . Coronary artery disease      1st degree relatvie <50  . Breast cancer      aunt- ? paternal or maternal  . Asthma Sister   . Stroke Neg Hx     ALLERGIES:  is allergic to sulfonamide derivatives; doxycycline; latex; ciprofloxacin; hydrocortisone; and neomycin.  MEDICATIONS:  Current Outpatient Prescriptions  Medication Sig Dispense Refill  . albuterol (PROVENTIL) (2.5 MG/3ML) 0.083% nebulizer solution Take 3 mLs (2.5 mg total) by nebulization every 6 (six) hours as needed for wheezing or shortness of breath (Dx: J44.1). 75 mL 3  . aspirin 81 MG EC tablet Take 1 tablet (81 mg total) by mouth daily. 90 tablet 3  . dextromethorphan-guaiFENesin (MUCINEX DM) 30-600 MG 12hr tablet Take 1 tablet by mouth 2 (two) times daily as needed.     . famotidine (PEPCID) 20 MG tablet One at bedtime (Patient taking differently: prn)    . hydrALAZINE (APRESOLINE) 25 MG tablet Take 1.5 tablets (37.5 mg total) by mouth 3 (three) times daily. 135 tablet 5  . isosorbide dinitrate (ISORDIL) 20 MG tablet Take 1 tablet (20 mg total) by mouth 3 (three) times daily. 90 tablet 5  . losartan (COZAAR) 100 MG tablet Take 1 tablet (100 mg total) by mouth daily. 90 tablet 1  . metolazone (ZAROXOLYN) 2.5 MG tablet Take 1 tablet (2.5 mg total) by mouth daily as needed. see notes (Patient taking differently: Take 2.5 mg by mouth daily as needed (for fluid). ) 90 tablet 1  . montelukast (SINGULAIR) 10 MG tablet Take 1 tablet (10 mg total) by mouth at bedtime. (Patient taking differently: Take 10 mg by mouth as needed. ) 30 tablet 11  . pantoprazole (PROTONIX) 40 MG tablet Take 1 tablet (40 mg total) by mouth daily. Take 30-60 min before first meal of the day 30 tablet 2  . potassium chloride SA (KLOR-CON M20) 20 MEQ tablet 2 tablets three times daily and one extra tablet as needed 217 tablet 3  . spironolactone (ALDACTONE) 25 MG tablet Take 0.5 tablets (12.5 mg total) by mouth 2 (two) times daily. 60  tablet 3  . topiramate (TOPAMAX) 25 MG tablet Take 25 mg by mouth 2 (two) times daily as needed (for headaches).     . torsemide (DEMADEX) 20 MG tablet Take 2 tablets (40 mg total) by mouth 2 (two) times daily. 120 tablet 9  . traMADol (ULTRAM) 50 MG tablet Take 1 tablet (50 mg total) by mouth every 6 (six) hours as needed for moderate pain. 40 tablet 0   No current facility-administered medications for this visit.     REVIEW OF SYSTEMS:   Constitutional: Denies fevers, chills or abnormal night sweats Eyes: Denies blurriness of vision, double vision or watery eyes Ears, nose, mouth, throat, and face: Denies mucositis or sore throat Cardiovascular: Denies palpitation, chest discomfort or lower extremity swelling Gastrointestinal:  Denies nausea, heartburn or change in bowel habits Skin: Denies abnormal skin rashes Lymphatics: Denies new lymphadenopathy or easy bruising Neurological:Denies numbness, tingling or new weaknesses Behavioral/Psych: Mood is stable, no new changes  All other systems were reviewed with the patient and are negative.  PHYSICAL EXAMINATION: ECOG PERFORMANCE STATUS: 1 - Symptomatic but completely ambulatory  Vitals:   08/25/15 1347  BP: 126/77  Pulse: 97  Resp: 20  Temp: 98.8 F (37.1 C)   Filed Weights   08/25/15 1347  Weight: 295 lb (133.8 kg)    GENERAL:alert, no distress and comfortable. She is morbidly obese SKIN: skin color, texture, turgor are normal, no rashes or significant lesions EYES: normal, conjunctiva are pink and non-injected, sclera clear OROPHARYNX:no exudate, no erythema and lips, buccal mucosa, and tongue normal  NECK: supple, thyroid normal size, non-tender, without nodularity LYMPH:  no palpable lymphadenopathy in the cervical, axillary or inguinal LUNGS: clear to auscultation and percussion with normal breathing effort HEART: regular rate & rhythm and no murmurs and no lower extremity edema ABDOMEN:abdomen soft, non-tender and  normal bowel sounds Musculoskeletal:no cyanosis of digits and no clubbing  PSYCH: alert & oriented x 3 with fluent speech NEURO: no focal motor/sensory deficits  LABORATORY DATA:  I have reviewed the data as listed Recent Results (from the past 2160 hour(s))  Nitric oxide     Status: None   Collection Time: 06/01/15 12:58 PM  Result Value Ref Range   Nitric Oxide 8   CBC with Differential/Platelet     Status: Abnormal   Collection Time: 06/01/15  1:00 PM  Result Value Ref Range   WBC 13.1 (H) 4.0 - 10.5 K/uL   RBC 3.71 (L) 3.87 - 5.11 Mil/uL   Hemoglobin 11.4 (L) 12.0 - 15.0 g/dL   HCT 33.3 (L) 36.0 - 46.0 %   MCV 89.6 78.0 - 100.0 fl   MCHC 34.2 30.0 - 36.0 g/dL   RDW 15.6 (H) 11.5 - 15.5 %   Platelets 276.0 150.0 - 400.0 K/uL   Neutrophils Relative % 63.3 43.0 - 77.0 %   Lymphocytes Relative 27.8 12.0 - 46.0 %   Monocytes Relative 5.8 3.0 - 12.0 %   Eosinophils Relative 2.9 0.0 - 5.0 %   Basophils Relative 0.2 0.0 - 3.0 %   Neutro Abs 8.3 (H) 1.4 - 7.7 K/uL   Lymphs Abs 3.6 0.7 - 4.0 K/uL   Monocytes Absolute 0.8 0.1 - 1.0 K/uL   Eosinophils Absolute 0.4 0.0 - 0.7 K/uL   Basophils Absolute 0.0 0.0 - 0.1 K/uL  Resp Allergy Profile Regn2DC DE MD  VA     Status: None   Collection Time: 06/01/15  1:00 PM  Result Value Ref Range   Allergen, D pternoyssinus,d7 <0.10 kU/L   D. farinae <0.10 kU/L   Cat Dander <0.10 kU/L   Dog Dander <0.10 kU/L   [place] Grass <0.10 kU/L   Timothy Grass <0.10 kU/L   Johnson Grass <0.10 kU/L   Cockroach <0.10 kU/L   Penicillium Notatum <0.10 kU/L   Cladosporium Herbarum <0.10 kU/L   Aspergillus fumigatus, m3 <0.10 kU/L   Alternaria Alternata <0.10 kU/L   Box Elder IgE <0.10 kU/L   Allergen, Comm Silver Wendee Copp, t9 <0.10 kU/L   Allergen, Cedar tree, t12 <0.10 kU/L   Allergen, Oak,t7 <0.10 kU/L   Elm IgE <0.10 kU/L   Allergen, Cottonwood, t14 <0.10 kU/L   Pecan/Hickory Tree IgE <0.10 kU/L   Allergen, Mulberry, t76 <0.10 kU/L   Common  Ragweed <0.10 kU/L   Rough Pigweed  IgE <0.10 kU/L   Sheep Sorrel IgE <0.10 kU/L   IgE (Immunoglobulin E), Serum 35 <115 kU/L   Allergen, Mouse Urine Protein, e78 <0.10 kU/L    Comment:   Footnotes:  (1)        ----------------------------------------------------      Class   Specific IgE (kU/L)   Level      ----------------------------------------------------      0       <0.10                 Absent or undetectable      0/1     0.10  -   0.34        Equivocal/Borderline      1       0.35  -   0.69        Low      2       0.70  -   3.49        Moderate      3       3.50  -  17.49        High      4       17.50 -  49.99        Very High      5       50.00 - 100.00        Very High      6       >100.00               Very High   Basic metabolic panel     Status: Abnormal   Collection Time: 06/01/15  1:03 PM  Result Value Ref Range   Sodium 138 135 - 145 mEq/L   Potassium 3.2 (L) 3.5 - 5.1 mEq/L   Chloride 98 96 - 112 mEq/L   CO2 29 19 - 32 mEq/L   Glucose, Bld 109 (H) 70 - 99 mg/dL   BUN 28 (H) 6 - 23 mg/dL   Creatinine, Ser 1.04 0.40 - 1.20 mg/dL   Calcium 9.6 8.4 - 10.5 mg/dL   GFR 70.36 >60.00 mL/min    RADIOGRAPHIC STUDIES: I review her prior CT scan I have personally reviewed the radiological images as listed and agreed with the findings in the report. [place] Extrem Low Left Ltd  Result Date: 08/16/2015 CLINICAL DATA:  Palpable abnormality in the left lateral thigh proximally in this patient of large body habitus EXAM: ULTRASOUND left LOWER EXTREMITY LIMITED TECHNIQUE: Ultrasound examination of the lower extremity soft tissues was performed in the area of clinical concern. COMPARISON:  None FINDINGS: Ultrasound over the area in question was performed. By ultrasound there are at least 4 oval areas in the region of the palpable abnormality. These lesions are less echogenic than adjacent fat, but they could represent lipomas. No internal blood flow is demonstrated. Most likely  these represent a benign process possibly fat necrosis as well. No suspicious features are seen by ultrasound. Followup clinically is recommended. IMPRESSION: At the site of the palpable abnormality there are at least 4 oval soft tissue structures present without internal blood flow. No suspicious ultrasound features are seen and these most likely are benign, representing either somewhat atypical lipomas or possibly areas of fat necrosis. Electronically Signed   By: Ivar Drape M.D.   On: 08/16/2015 12:12   ASSESSMENT & PLAN  Leukocytosis She has  chronic intermittent leukocytosis likely reactive in nature from infection and chronic nasal drainage. This is a benign, reactive and she does not need further follow-up or workup  Sinusitis, chronic She have chronic sinusitis and had recent CT done. She follows with ENT surgeon who is managing her sinus drainage conservatively This is a most likely cause of her chronic, recurrent leukocytosis  Morbid obesity due to excess calories ([place]) She is morbidly obese and has significant reflux. CT imaging showed possible hiatal hernia. We discussed treatment with proton pump inhibitor and recommend weight loss program through her primary care doctor  Cough variant asthma She was evaluated in the pulmonology office and have chronic reflux/GERD which probably exacerbated her cough. Again, as above, recommend proton pump inhibitor   No orders of the defined types were placed in this encounter.   All questions were answered. The patient knows to call the clinic with any problems, questions or concerns. I spent 30 minutes counseling the patient face to face. The total time spent in the appointment was 40 minutes and more than 50% was on counseling.     [facility], Shateria Paternostro, MD 08/25/2015 6:14 PM

## 2015-08-25 NOTE — Assessment & Plan Note (Signed)
She is morbidly obese and has significant reflux. CT imaging showed possible hiatal hernia. We discussed treatment with proton pump inhibitor and recommend weight loss program through her primary care doctor

## 2015-08-28 ENCOUNTER — Telehealth: Payer: Self-pay | Admitting: *Deleted

## 2015-08-28 ENCOUNTER — Encounter: Payer: No Typology Code available for payment source | Admitting: Adult Health

## 2015-08-28 DIAGNOSIS — Z79899 Other long term (current) drug therapy: Secondary | ICD-10-CM

## 2015-08-28 MED ORDER — POTASSIUM CHLORIDE CRYS ER 20 MEQ PO TBCR: EXTENDED_RELEASE_TABLET | ORAL | 6 refills | Status: DC

## 2015-08-28 NOTE — Telephone Encounter (Signed)
Pt aware of her blood work result, new rx for potassium send into pt pharmacy, BMP ordered.

## 2015-08-28 NOTE — Telephone Encounter (Signed)
-----   Message from Minus Breeding, MD sent at 08/27/2015 11:05 AM EDT ----- She needs to increase her potassium by taking an extra 20 meq of KCL twice daily in addition to what she is already taking.  Move up the next BMET in 7 days.   Call Ms. Goodall with the results and send results to Bartholome Bill, MD

## 2015-08-29 ENCOUNTER — Telehealth: Payer: Self-pay | Admitting: *Deleted

## 2015-08-29 NOTE — Telephone Encounter (Signed)
-----   Message from Minus Breeding, MD sent at 08/29/2015  8:46 AM EDT ----- Have it done Friday but we need to have a way of getting in touch with her.  ----- Message ----- From: Vennie Homans Sent: 08/28/2015   2:28 PM To: Minus Breeding, MD  You want pt to have blood work 7 days after her increase in potassium, pt stated she is leaving to go out of town this weekend and she won't be back until after Labor day. Can pt have blood work done this Friday or should she wait until she get back?

## 2015-08-29 NOTE — Telephone Encounter (Signed)
Spoke with pt to get blood work done on Friday, pt stated her cell # is the number to reach her.

## 2015-09-07 ENCOUNTER — Other Ambulatory Visit: Payer: Self-pay | Admitting: Cardiology

## 2015-09-07 NOTE — Telephone Encounter (Signed)
Ph[facility] [place] for [facility] day supply w/no refills.  Patient notified.

## 2015-09-07 NOTE — Telephone Encounter (Signed)
°*  STAT* If patient is at the pharmacy, call can be transferred to refill team.   1. Which medications need to be refilled? (please list name of each medication and dose if known) Hydralazine 25mg    2. Which pharmacy/location (including street and city if local pharmacy) is [facility] to [facility] [place] [facility] ph[facility][phone]  3. Do they need a 30 day or 90 day supply?90  Needs a new prescription sent .. ( she has only one pill left ) would like someone to call heer to let her know that it has been sent to that pharmacy

## 2015-09-23 ENCOUNTER — Other Ambulatory Visit: Payer: Self-pay | Admitting: Cardiology

## 2015-09-26 NOTE — Telephone Encounter (Signed)
Rx(s) sent to pharmacy electronically.  

## 2015-09-29 ENCOUNTER — Other Ambulatory Visit: Payer: Self-pay

## 2015-09-29 ENCOUNTER — Telehealth: Payer: Self-pay | Admitting: Cardiology

## 2015-09-29 MED ORDER — HYDRALAZINE HCL 25 MG PO TABS: 37.5000 mg | ORAL_TABLET | Freq: Three times a day (TID) | ORAL | 1 refills | Status: DC

## 2015-09-29 NOTE — Telephone Encounter (Signed)
New message    Pharmacist states that the pt needs a 90 day supply. Please call.

## 2015-10-04 ENCOUNTER — Other Ambulatory Visit: Payer: Self-pay | Admitting: Cardiology

## 2015-10-04 NOTE — Telephone Encounter (Signed)
°*  STAT* If patient is at the pharmacy, call can be transferred to refill team.   1. Which medications need to be refilled? (please list name of each medication and dose if known) Need prescription for 90 days so insurance will pay-Hydralazine  2. Which pharmacy/location (including street and city if local pharmacy) is [phone]  3. Do they need a 30 day or 90 day supply? 90 days

## 2015-10-05 ENCOUNTER — Other Ambulatory Visit: Payer: Self-pay

## 2015-10-05 MED ORDER — HYDRALAZINE HCL 25 MG PO TABS: ORAL_TABLET | ORAL | 3 refills | Status: DC

## 2015-10-05 NOTE — Telephone Encounter (Signed)
CALLED TO VERIFY PHARMACY, PT IS BACK IN [place], [facility] [address].

## 2015-10-07 ENCOUNTER — Telehealth: Payer: Self-pay | Admitting: Pulmonary Disease

## 2015-10-07 NOTE — Telephone Encounter (Signed)
Cough worse, cannot use CPAP machine Requesting codeine cough syrup  - OK not to use CPAP until cough better -asked to use delsym 85ml bid -Office visit on Monday - [name] to decide if [facility] for codeine

## 2015-10-09 ENCOUNTER — Encounter: Payer: Self-pay | Admitting: Adult Health

## 2015-10-09 ENCOUNTER — Other Ambulatory Visit: Payer: Self-pay | Admitting: Cardiology

## 2015-10-09 ENCOUNTER — Other Ambulatory Visit: Payer: Self-pay | Admitting: *Deleted

## 2015-10-09 ENCOUNTER — Ambulatory Visit (INDEPENDENT_AMBULATORY_CARE_PROVIDER_SITE_OTHER): Payer: 59 | Admitting: Adult Health

## 2015-10-09 DIAGNOSIS — J45991 Cough variant asthma: Secondary | ICD-10-CM

## 2015-10-09 DIAGNOSIS — R058 Other specified cough: Secondary | ICD-10-CM

## 2015-10-09 DIAGNOSIS — R05 Cough: Secondary | ICD-10-CM | POA: Diagnosis not present

## 2015-10-09 MED ORDER — MONTELUKAST SODIUM 10 MG PO TABS: 10.0000 mg | ORAL_TABLET | Freq: Every day | ORAL | 2 refills | Status: DC

## 2015-10-09 MED ORDER — AMOXICILLIN-POT CLAVULANATE 875-125 MG PO TABS: 1.0000 | ORAL_TABLET | Freq: Two times a day (BID) | ORAL | 0 refills | Status: DC

## 2015-10-09 MED ORDER — PROMETHAZINE-CODEINE 6.25-10 MG/5ML PO SYRP: 5.0000 mL | ORAL_SOLUTION | Freq: Four times a day (QID) | ORAL | 0 refills | Status: DC | PRN

## 2015-10-09 MED ORDER — ALBUTEROL SULFATE (2.5 MG/3ML) 0.083% IN NEBU: 2.5000 mg | INHALATION_SOLUTION | Freq: Four times a day (QID) | RESPIRATORY_TRACT | 3 refills | Status: DC | PRN

## 2015-10-09 NOTE — Patient Instructions (Addendum)
Augmentin 875mg  Twice daily  For 7 days ,take with food Mucinex DM Twice daily  As needed  Cough/congestion  Phenergan w/ codeine 1 tsp every 6hr As needed  Cough , may make you sleepy Saline nasal rinses As needed   follow up Dr. [name]  In 3 months and As needed   Please contact office for sooner follow up if symptoms do not improve or worsen or seek emergency care

## 2015-10-09 NOTE — Progress Notes (Signed)
Subjective:    Patient ID: [name], female    DOB: 1960/05/02, 55 y.o.   MRN: [id number]  HPI 55 yo female with chronic cough , ?asthma and OSA on CPAP   10/09/2015 Acute OV  Pt presents for an acute office visit.  Complains of 1 week of thick mucus with yellow color, sob, and wheezing .  Has been using delsym without any help.  Cough is keeping her up at night .  Hard to wear CPAP mask due to coughing.  No recent travel or abx use.  Denies chest pain, orthopnea, edema or hemoptyiss. No fever.r      Past Medical History:  Diagnosis Date  . Acute on chronic diastolic CHF (congestive heart failure) ([place]) 04/11/2014  . Acute suppurative otitis media without spontaneous rupture of eardrum 02/04/2007   Centricity Description: OTITIS MEDIA, SUPPURATIVE, ACUTE, BILATERAL Qualifier: Diagnosis of  By: [name] MD, [name]  Centricity Description: OTITIS MEDIA, SUPPURATIVE, ACUTE Qualifier: Diagnosis of  By: [name] MD, [name]   . Allergic rhinitis   . CAD (coronary artery disease)    LHC (11/29/1999):  EF 60%; no significant CAD.  [name] Carpal tunnel syndrome   . Carpal tunnel syndrome 06/09/2006   Qualifier: Diagnosis of  By: Marca Ancona RMA, Lucy    . Chronic diastolic CHF (congestive heart failure) ([place])    a. Echo (01/21/11):  Vigorous LVF, EF 65-70%, no RWMA, Gr 2 DD. ;  b.  Echo (12/15): Moderate LVH, EF 123456, grade 2 diastolic dysfunction, mild LAE, normal RV function  . Common migraine   . Diastolic heart failure ([place])   . Diverticulitis   . Hepatic steatosis   . Hepatomegaly   . Hiatal hernia   . Hyperlipidemia   . Hypertension   . IBS (irritable bowel syndrome)   . Internal hemorrhoids   . Morbid obesity ([place])   . OSA (obstructive sleep apnea)    CPAP dependent  . PNEUMONIA, ORGAN UNSPECIFIED 03/01/2009   Qualifier: Diagnosis of  By: [name] CMA, [name]    . Sinusitis, chronic 03/10/2012   CT sinuses 02/2012:  Chronic sinusitis with small A/F levels >> rx by ENT with  prolonged augmentin F/u ct sinuses 04/2012:  Persistent sinus thickening >> rx with levaquin and clinda for 30 days.  Told to return for sinus scan in 6 weeks >> no showed for followup visit.  CT sinuses 11/2013:  Acute and chronic sinusitis noted.     Current Outpatient Prescriptions on File Prior to Visit  Medication Sig Dispense Refill  . albuterol (PROVENTIL) (2.5 MG/3ML) 0.083% nebulizer solution Take 3 mLs (2.5 mg total) by nebulization every 6 (six) hours as needed for wheezing or shortness of breath (Dx: J44.1). 75 mL 3  . aspirin 81 MG EC tablet Take 1 tablet (81 mg total) by mouth daily. 90 tablet 3  . dextromethorphan-guaiFENesin (MUCINEX DM) 30-600 MG 12hr tablet Take 1 tablet by mouth 2 (two) times daily as needed.     . famotidine (PEPCID) 20 MG tablet One at bedtime (Patient taking differently: prn)    . hydrALAZINE (APRESOLINE) 25 MG tablet TAKE 1&1/2 TABLET BY MOUTH 3 TIMES A DAY 405 tablet 3  . isosorbide dinitrate (ISORDIL) 20 MG tablet Take 1 tablet (20 mg total) by mouth 3 (three) times daily. 90 tablet 5  . losartan (COZAAR) 100 MG tablet TAKE 1 TABLET (100 MG TOTAL) BY MOUTH DAILY. 90 tablet 1  . metolazone (ZAROXOLYN) 2.5 MG tablet Take 1 tablet (  2.5 mg total) by mouth daily as needed. see notes (Patient taking differently: Take 2.5 mg by mouth daily as needed (for fluid). ) 90 tablet 1  . montelukast (SINGULAIR) 10 MG tablet Take 1 tablet (10 mg total) by mouth at bedtime. (Patient taking differently: Take 10 mg by mouth as needed. ) 30 tablet 11  . pantoprazole (PROTONIX) 40 MG tablet Take 1 tablet (40 mg total) by mouth daily. Take 30-60 min before first meal of the day 30 tablet 2  . potassium chloride SA (KLOR-CON M20) 20 MEQ tablet 3 tablets three times daily 270 tablet 6  . spironolactone (ALDACTONE) 25 MG tablet Take 0.5 tablets (12.5 mg total) by mouth 2 (two) times daily. 60 tablet 3  . topiramate (TOPAMAX) 25 MG tablet Take 25 mg by mouth 2 (two) times daily as  needed (for headaches).     . torsemide (DEMADEX) 20 MG tablet Take 2 tablets (40 mg total) by mouth 2 (two) times daily. 120 tablet 9  . traMADol (ULTRAM) 50 MG tablet Take 1 tablet (50 mg total) by mouth every 6 (six) hours as needed for moderate pain. 40 tablet 0   No current facility-administered medications on file prior to visit.      Review of Systems Constitutional:   No  weight loss, night sweats,  Fevers, chills, +fatigue, or  lassitude.  HEENT:   No headaches,  Difficulty swallowing,  Tooth/dental problems, or  Sore throat,                No sneezing, itching, ear ache, + nasal congestion, post nasal drip,   CV:  No chest pain,  Orthopnea, PND, swelling in lower extremities, anasarca, dizziness, palpitations, syncope.   GI  No heartburn, indigestion, abdominal pain, nausea, vomiting, diarrhea, change in bowel habits, loss of appetite, bloody stools.   Resp:    No chest wall deformity  Skin: no rash or lesions.  GU: no dysuria, change in color of urine, no urgency or frequency.  No flank pain, no hematuria   MS:  No joint pain or swelling.  No decreased range of motion.  No back pain.  Psych:  No change in mood or affect. No depression or anxiety.  No memory loss.         Objective:   Physical Exam Vitals:   10/09/15 1643  BP: 114/74  Pulse: 83  Temp: 98.4 F (36.9 C)  TempSrc: Oral  SpO2: 97%  Weight: 290 lb (131.5 kg)  Height: 5\' 3"  (1.6 m)   .GEN: A/Ox3; pleasant , NAD, obese    HEENT:  [place]/AT,  EACs-clear, TMs-wnl, NOSE-clear drainage , THROAT-clear, no lesions, no postnasal drip or exudate noted.   NECK:  Supple w/ fair ROM; no JVD; normal carotid impulses w/o bruits; no thyromegaly or nodules palpated; no lymphadenopathy.    RESP  Clear  P & A; w/o, wheezes/ rales/ or rhonchi. no accessory muscle use, no dullness to percussion  CARD:  RRR, no m/r/g  , no peripheral edema, pulses intact, no cyanosis or clubbing.  GI:   Soft & nt; nml bowel  sounds; no organomegaly or masses detected.   Musco: Warm bil, no deformities or joint swelling noted.   Neuro: alert, no focal deficits noted.    Skin: Warm, no lesions or rashes  Charlotte Griego NP-C  [facility]  10/09/2015        Assessment & Plan:

## 2015-10-09 NOTE — Addendum Note (Signed)
Addended by: Osa Craver on: 10/09/2015 05:14 PM   Modules accepted: Orders

## 2015-10-09 NOTE — Assessment & Plan Note (Signed)
Flare with URI   Plan  Patient Instructions  Augmentin 875mg  Twice daily  For 7 days ,take with food Mucinex DM Twice daily  As needed  Cough/congestion  Phenergan w/ codeine 1 tsp every 6hr As needed  Cough , may make you sleepy Saline nasal rinses As needed   follow up Dr. [name]  In 3 months and As needed   Please contact office for sooner follow up if symptoms do not improve or worsen or seek emergency care

## 2015-10-09 NOTE — Progress Notes (Signed)
Chart and office note reviewed in detail  > agree with a/p as outlined    

## 2015-10-10 MED ORDER — SPIRONOLACTONE 25 MG PO TABS: 12.5000 mg | ORAL_TABLET | Freq: Two times a day (BID) | ORAL | 3 refills | Status: DC

## 2015-10-10 NOTE — Telephone Encounter (Signed)
Rx has been sent to the pharmacy electronically. ° °

## 2015-10-12 ENCOUNTER — Telehealth: Payer: Self-pay | Admitting: Pulmonary Disease

## 2015-10-12 ENCOUNTER — Telehealth: Payer: Self-pay

## 2015-10-12 DIAGNOSIS — R058 Other specified cough: Secondary | ICD-10-CM

## 2015-10-12 DIAGNOSIS — R05 Cough: Secondary | ICD-10-CM

## 2015-10-12 NOTE — Telephone Encounter (Signed)
Last ov on 10/09/15 Instructions      Return in about 3 months (around 01/08/2016) for Follow up with Dr. [name].  Augmentin 875mg  Twice daily  For 7 days ,take with food Mucinex DM Twice daily  As needed  Cough/congestion  Phenergan w/ codeine 1 tsp every 6hr As needed  Cough , may make you sleepy Saline nasal rinses As needed   follow up Dr. [name]  In 3 months and As needed   Please contact office for sooner follow up if symptoms do not improve or worsen or seek emergency care    Pt called c/o wheezing at times and prod cough with yellow mucus. Denies any sinus drainage/pressure, chest congestion/tightness, fever, nausea or vomiting. She states she will complete the Augmentin on 10/14/15 that was given at her ov on 10/09/15 and is currently taking Mucinex DM. Verified pharmacy as [facility] on [address]. Pt would like a message sent to TP for further recs. She voiced understanding and had no further questions.   TP please advise

## 2015-10-12 NOTE — Telephone Encounter (Signed)
atc pt to relay recs, pt's vm not yet set up.  Wcb.

## 2015-10-12 NOTE — Telephone Encounter (Signed)
Would finish abx , if still having sx on Monday call back , can extend abx  And consider adding prednisone if needed Please contact office for sooner follow up if symptoms do not improve or worsen or seek emergency care

## 2015-10-12 NOTE — Telephone Encounter (Signed)
Called by Berenice Primas who relates that she has been on Augmentin, Mucinex and inhaled bronchodilator per instructions when seen in clinic Monday. She states that she is not getting better. She still c/o cough and SOB associated with some wheezing. She does have a history of grade II diastolic heart failure. It is hard to adequately evaluate her over the telephone as there may be some element of heart failure involved as well.   I informed her that if she was not improved with the antibiotic regimen that she is currently on  that she needed to come to the emergency department to be evaluated for possible hospital admission and IV antibiotics and steroids. She appears to be reluctant to come to the ED and she will see how she feels later tonight.

## 2015-10-13 NOTE — Telephone Encounter (Signed)
See other phone note dated 10/12/15. Pt call answering service. Per TP response: [name] S Parrett, NP      8:52 AM  Note    Needs ov to sort out  Please contact office for sooner follow up if symptoms do not improve or worsen or seek emergency care    ---   ATC pt, NA, VM not set up. WCB

## 2015-10-13 NOTE — Telephone Encounter (Addendum)
ATC pt, NA, VM not set up. WCB See phone note 10/12/15

## 2015-10-13 NOTE — Telephone Encounter (Signed)
Needs ov to sort out  Please contact office for sooner follow up if symptoms do not improve or worsen or seek emergency care

## 2015-10-14 MED ORDER — ALBUTEROL SULFATE (2.5 MG/3ML) 0.083% IN NEBU: 2.5000 mg | INHALATION_SOLUTION | Freq: Four times a day (QID) | RESPIRATORY_TRACT | 3 refills | Status: DC | PRN

## 2015-10-14 MED ORDER — MONTELUKAST SODIUM 10 MG PO TABS: 10.0000 mg | ORAL_TABLET | Freq: Every day | ORAL | 2 refills | Status: DC

## 2015-10-14 MED ORDER — AMOXICILLIN-POT CLAVULANATE 875-125 MG PO TABS: 1.0000 | ORAL_TABLET | Freq: Two times a day (BID) | ORAL | 0 refills | Status: AC

## 2015-10-14 MED ORDER — AMOXICILLIN-POT CLAVULANATE 875-125 MG PO TABS: 1.0000 | ORAL_TABLET | Freq: Two times a day (BID) | ORAL | 0 refills | Status: DC

## 2015-10-14 NOTE — Telephone Encounter (Signed)
Call received from Ms. Hogge re augmentin, albuterol and singulair. She needs them sent to [facility], not [facility].  Rx sent per last note from our office.

## 2015-10-17 ENCOUNTER — Encounter: Payer: 59 | Admitting: Adult Health

## 2015-10-27 ENCOUNTER — Ambulatory Visit: Payer: 59 | Admitting: Physician Assistant

## 2015-10-29 ENCOUNTER — Emergency Department (HOSPITAL_COMMUNITY)
Admission: EM | Admit: 2015-10-29 | Discharge: 2015-10-29 | Disposition: A | Discharge status: Home / Self Care | Payer: No Typology Code available for payment source | Attending: Emergency Medicine | Admitting: Emergency Medicine

## 2015-10-29 ENCOUNTER — Encounter (HOSPITAL_COMMUNITY): Payer: Self-pay | Admitting: Emergency Medicine

## 2015-10-29 ENCOUNTER — Emergency Department (HOSPITAL_COMMUNITY): Payer: No Typology Code available for payment source

## 2015-10-29 DIAGNOSIS — I2510 Atherosclerotic heart disease of native coronary artery without angina pectoris: Secondary | ICD-10-CM | POA: Diagnosis not present

## 2015-10-29 DIAGNOSIS — Z91040 Latex allergy status: Secondary | ICD-10-CM | POA: Insufficient documentation

## 2015-10-29 DIAGNOSIS — I110 Hypertensive heart disease with heart failure: Secondary | ICD-10-CM | POA: Diagnosis not present

## 2015-10-29 DIAGNOSIS — R0602 Shortness of breath: Secondary | ICD-10-CM | POA: Diagnosis present

## 2015-10-29 DIAGNOSIS — R7989 Other specified abnormal findings of blood chemistry: Secondary | ICD-10-CM | POA: Insufficient documentation

## 2015-10-29 DIAGNOSIS — Z7982 Long term (current) use of aspirin: Secondary | ICD-10-CM | POA: Diagnosis not present

## 2015-10-29 DIAGNOSIS — I5033 Acute on chronic diastolic (congestive) heart failure: Secondary | ICD-10-CM | POA: Insufficient documentation

## 2015-10-29 DIAGNOSIS — J449 Chronic obstructive pulmonary disease, unspecified: Secondary | ICD-10-CM | POA: Insufficient documentation

## 2015-10-29 LAB — CBC
HEMATOCRIT: 36 % (ref 36.0–46.0)
HEMOGLOBIN: 11.8 g/dL — AB (ref 12.0–15.0)
MCH: 30.3 pg (ref 26.0–34.0)
MCHC: 32.8 g/dL (ref 30.0–36.0)
MCV: 92.3 fL (ref 78.0–100.0)
Platelets: 308 10*3/uL (ref 150–400)
RBC: 3.9 MIL/uL (ref 3.87–5.11)
RDW: 14.4 % (ref 11.5–15.5)
WBC: 12 10*3/uL — ABNORMAL HIGH (ref 4.0–10.5)

## 2015-10-29 LAB — URINALYSIS, ROUTINE W REFLEX MICROSCOPIC
BILIRUBIN URINE: NEGATIVE
Glucose, UA: NEGATIVE mg/dL
HGB URINE DIPSTICK: NEGATIVE
Ketones, ur: NEGATIVE mg/dL
Leukocytes, UA: NEGATIVE
Nitrite: NEGATIVE
PH: 6 (ref 5.0–8.0)
Protein, ur: NEGATIVE mg/dL
SPECIFIC GRAVITY, URINE: 1.01 (ref 1.005–1.030)

## 2015-10-29 LAB — D-DIMER, QUANTITATIVE: D-Dimer, Quant: 1.08 ug/mL-FEU — ABNORMAL HIGH (ref 0.00–0.50)

## 2015-10-29 LAB — COMPREHENSIVE METABOLIC PANEL
ALBUMIN: 3.9 g/dL (ref 3.5–5.0)
ALK PHOS: 81 U/L (ref 38–126)
ALT: 24 U/L (ref 14–54)
ANION GAP: 10 (ref 5–15)
AST: 25 U/L (ref 15–41)
BUN: 28 mg/dL — ABNORMAL HIGH (ref 6–20)
CALCIUM: 9.6 mg/dL (ref 8.9–10.3)
CO2: 29 mmol/L (ref 22–32)
Chloride: 96 mmol/L — ABNORMAL LOW (ref 101–111)
Creatinine, Ser: 1.23 mg/dL — ABNORMAL HIGH (ref 0.44–1.00)
GFR calc Af Amer: 56 mL/min — ABNORMAL LOW (ref >60)
GFR calc non Af Amer: 48 mL/min — ABNORMAL LOW (ref >60)
GLUCOSE: 163 mg/dL — AB (ref 65–99)
Potassium: 2.9 mmol/L — ABNORMAL LOW (ref 3.5–5.1)
SODIUM: 135 mmol/L (ref 135–145)
Total Bilirubin: 0.8 mg/dL (ref 0.3–1.2)
Total Protein: 9 g/dL — ABNORMAL HIGH (ref 6.5–8.1)

## 2015-10-29 LAB — LIPASE, BLOOD: LIPASE: 72 U/L — AB (ref 11–51)

## 2015-10-29 LAB — TROPONIN I: Troponin I: <0.03 ng/mL (ref <0.03)

## 2015-10-29 LAB — BRAIN NATRIURETIC PEPTIDE: B Natriuretic Peptide: 25.2 pg/mL (ref 0.0–100.0)

## 2015-10-29 MED ORDER — IOPAMIDOL (ISOVUE-370) INJECTION 76%
100.0000 mL | Freq: Once | INTRAVENOUS | Status: AC | PRN
  Administered 2015-10-29: 100 mL via INTRAVENOUS

## 2015-10-29 MED ORDER — POTASSIUM CHLORIDE CRYS ER 20 MEQ PO TBCR
40.0000 meq | EXTENDED_RELEASE_TABLET | Freq: Once | ORAL | Status: AC
  Administered 2015-10-29: 40 meq via ORAL
  Filled 2015-10-29: qty 2

## 2015-10-29 MED ORDER — ALBUTEROL SULFATE (2.5 MG/3ML) 0.083% IN NEBU
5.0000 mg | INHALATION_SOLUTION | Freq: Once | RESPIRATORY_TRACT | Status: AC
  Administered 2015-10-29: 5 mg via RESPIRATORY_TRACT
  Filled 2015-10-29: qty 6

## 2015-10-29 MED ORDER — SODIUM CHLORIDE 0.9 % IV BOLUS (SEPSIS)
1000.0000 mL | Freq: Once | INTRAVENOUS | Status: AC
  Administered 2015-10-29: 1000 mL via INTRAVENOUS

## 2015-10-29 NOTE — ED Notes (Signed)
Patient transported to CT 

## 2015-10-29 NOTE — Discharge Instructions (Signed)
Continue taking your home medications as prescribed. I recommend using her home albuterol treatments as needed for shortness of breath/wheezing. Continue to drink fluids at home to remain hydrated. I recommend calling your primary care provider's office tomorrow morning to schedule follow-up appointment to be seen in the next 48 hours regarding her shortness of breath. I recommended having her labs rechecked due to your elevated creatinine seen in the ED today at a value of 1.23. Her potassium should also be rechecked (K 2.9 in the ED).  Please return to the Emergency Department if symptoms worsen or new onset of fever, headache, lightheadedness, dizziness, chest pain, difficulty breathing, coughing up blood, numbness, tickling, weakness, syncope, leg swelling.

## 2015-10-29 NOTE — ED Provider Notes (Signed)
[place] DEPT Provider Note   CSN: [id number] Arrival date & time: 10/29/15  1148     History   Chief Complaint Chief Complaint  Patient presents with  . Shortness of Breath    HPI [name] is a 55 y.o. female.  Patient is a 37 showed female with history of hypertension, hyperlipidemia, OSA, CAD and CHF who presents the ED with complaint of shortness of breath. Patient reports over the past 2 weeks she has had shortness of breath which she notes is worse when laying supine or while ambulating. Endorses dyspnea on exertion. She notes she was seen by her PCP initially and was diagnosed with a URI and started on Augmentin. She notes due to her symptoms not improving after the first week of antibiotics they added on another week of Augmentin. Patient reports completing her antibiotics to ask days ago but notes she continued to have shortness of breath. Endorses associated wheezing, productive cough with clear sputum. She notes she has been taking her albuterol neb treatments at home with mild intermittent relief. Patient states she gave herself an neb treatment around 9 AM this morning with improvement of her shortness of breath and denies any wheezing since arrival to the ED. Denies fever, chills, headache, nasal congestion, rhinorrhea, ear pain, sore throat, hemoptysis, chest pain, palpitations, abdominal pain, nausea, vomiting, diarrhea, urinary symptoms, leg swelling, lightheadedness, dizziness. Patient denies any recent hospitalizations or surgeries, denies any recent immobilization, denies history of DVT, denies active cancer.      Past Medical History:  Diagnosis Date  . Acute on chronic diastolic CHF (congestive heart failure) ([place]) 04/11/2014  . Acute suppurative otitis media without spontaneous rupture of eardrum 02/04/2007   Centricity Description: OTITIS MEDIA, SUPPURATIVE, ACUTE, BILATERAL Qualifier: Diagnosis of  By: [name] MD, [name]  Centricity Description: OTITIS  MEDIA, SUPPURATIVE, ACUTE Qualifier: Diagnosis of  By: [name] MD, [name]   . Allergic rhinitis   . CAD (coronary artery disease)    LHC (11/29/1999):  EF 60%; no significant CAD.  [name] Carpal tunnel syndrome   . Carpal tunnel syndrome 06/09/2006   Qualifier: Diagnosis of  By: Marca Ancona RMA, Lucy    . Chronic diastolic CHF (congestive heart failure) ([place])    a. Echo (01/21/11):  Vigorous LVF, EF 65-70%, no RWMA, Gr 2 DD. ;  b.  Echo (12/15): Moderate LVH, EF 123456, grade 2 diastolic dysfunction, mild LAE, normal RV function  . Common migraine   . Diastolic heart failure ([place])   . Diverticulitis   . Hepatic steatosis   . Hepatomegaly   . Hiatal hernia   . Hyperlipidemia   . Hypertension   . IBS (irritable bowel syndrome)   . Internal hemorrhoids   . Morbid obesity ([place])   . OSA (obstructive sleep apnea)    CPAP dependent  . PNEUMONIA, ORGAN UNSPECIFIED 03/01/2009   Qualifier: Diagnosis of  By: [name] CMA, [name]    . Sinusitis, chronic 03/10/2012   CT sinuses 02/2012:  Chronic sinusitis with small A/F levels >> rx by ENT with prolonged augmentin F/u ct sinuses 04/2012:  Persistent sinus thickening >> rx with levaquin and clinda for 30 days.  Told to return for sinus scan in 6 weeks >> no showed for followup visit.  CT sinuses 11/2013:  Acute and chronic sinusitis noted.      Patient Active Problem List   Diagnosis Date Noted  . Cough variant asthma 07/31/2015  . Upper airway cough syndrome 04/11/2015  . Severe obesity (  BMI >= 40) ([place]) 04/11/2015  . Bronchitis, chronic obstructive w acute bronchitis ([place]) 04/11/2015  . Dyspnea   . Acute on chronic diastolic CHF (congestive heart failure), NYHA class 2 ([place]) 03/27/2015  . Morbid obesity due to excess calories ([place]) 03/27/2015  . Dysphagia 03/27/2015  . Hyponatremia 03/27/2015  . Acute kidney injury ([place]) 03/27/2015  . Hypokalemia 03/27/2015  . Leukocytosis 03/27/2015  . COPD exacerbation ([place]) 03/27/2015  . Sinusitis, chronic  03/10/2012  . Hyperlipidemia 10/02/2006  . Obstructive sleep apnea 06/09/2006  . Essential hypertension 06/09/2006    Past Surgical History:  Procedure Laterality Date  . CESAREAN SECTION    . NASAL SINUS SURGERY    . PARTIAL HYSTERECTOMY    . TONSILLECTOMY    . TUBAL LIGATION    . UVULOPALATOPHARYNGOPLASTY      OB History    No data available       Home Medications    Prior to Admission medications   Medication Sig Start Date End Date Taking? Authorizing Provider  albuterol (PROVENTIL) (2.5 MG/3ML) 0.083% nebulizer solution Take 3 mLs (2.5 mg total) by nebulization every 6 (six) hours as needed for wheezing or shortness of breath (Dx: J44.1). 10/14/15  Yes Juanito Doom, MD  aspirin 81 MG EC tablet Take 1 tablet (81 mg total) by mouth daily. 07/03/15  Yes Almyra Deforest, PA  BIDIL 20-37.5 MG tablet Take 1 tablet by mouth 3 (three) times daily. 09/11/15  Yes Historical Provider, MD  budesonide-formoterol (SYMBICORT) 80-4.5 MCG/ACT inhaler Inhale 2 puffs into the lungs 2 (two) times daily as needed (For shortness of breath.).    Yes Historical Provider, MD  chlorpheniramine (CHLOR-TRIMETON) 4 MG tablet Take 4 mg by mouth at bedtime as needed for allergies.   Yes Historical Provider, MD  dextromethorphan-guaiFENesin (MUCINEX DM) 30-600 MG 12hr tablet Take 1 tablet by mouth 2 (two) times daily as needed.    Yes Historical Provider, MD  famotidine (PEPCID) 20 MG tablet One at bedtime Patient taking differently: Take 20 mg by mouth 2 (two) times daily as needed for heartburn.  04/07/15  Yes Tanda Rockers, MD  LITTLE NOSES SALINE NA Place 1 spray into the nose as needed (For congestion.).    Yes Historical Provider, MD  losartan (COZAAR) 100 MG tablet TAKE 1 TABLET (100 MG TOTAL) BY MOUTH DAILY. 09/26/15  Yes Minus Breeding, MD  meclizine (ANTIVERT) 25 MG tablet Take 25 mg by mouth 3 (three) times daily as needed for dizziness.   Yes Historical Provider, MD  metolazone (ZAROXOLYN) 2.5 MG  tablet Take 1 tablet (2.5 mg total) by mouth daily as needed. see notes Patient taking differently: Take 2.5 mg by mouth daily as needed (For fluid.).  11/29/14  Yes Minus Breeding, MD  montelukast (SINGULAIR) 10 MG tablet Take 1 tablet (10 mg total) by mouth at bedtime. 10/14/15  Yes Juanito Doom, MD  pantoprazole (PROTONIX) 40 MG tablet Take 1 tablet (40 mg total) by mouth daily. Take 30-60 min before first meal of the day 04/07/15  Yes Tanda Rockers, MD  potassium chloride SA (K-DUR,KLOR-CON) 20 MEQ tablet TAKE 2 TABLET BY MOUTH THREE TIMES DAILY AND 1 TABLET EXTRA AS NEEDED Patient taking differently: TAKE 2 TABLET BY MOUTH THREE TIMES DAILY AND 1 TABLET EXTRA AS NEEDED FOR POTASSIUM LOSS. 10/10/15  Yes Minus Breeding, MD  promethazine-codeine (PHENERGAN WITH CODEINE) 6.25-10 MG/5ML syrup Take 5 mLs by mouth every 6 (six) hours as needed for cough. 10/09/15  Yes [name]  S Parrett, NP  spironolactone (ALDACTONE) 25 MG tablet Take 0.5 tablets (12.5 mg total) by mouth 2 (two) times daily. 10/10/15  Yes Minus Breeding, MD  Tetrahydroz-Dextran-PEG-Povid (EYE DROPS ADVANCED RELIEF OP) Place 1 drop into both eyes as needed (For eye irritation.).   Yes Historical Provider, MD  topiramate (TOPAMAX) 25 MG tablet Take 25 mg by mouth 2 (two) times daily as needed (for headaches).  03/03/15  Yes Historical Provider, MD  torsemide (DEMADEX) 20 MG tablet Take 2 tablets (40 mg total) by mouth 2 (two) times daily. 08/25/15 11/23/15 Yes Minus Breeding, MD  traMADol (ULTRAM) 50 MG tablet Take 1 tablet (50 mg total) by mouth every 6 (six) hours as needed for moderate pain. 07/31/15  Yes Tanda Rockers, MD    Family History Family History  Problem Relation Age of Onset  . Heart disease Mother   . Heart attack Mother   . Hypertension Mother   . Coronary artery disease      1st degree relatvie <50  . Breast cancer      aunt- ? paternal or maternal  . Asthma Sister   . Stroke Neg Hx     Social History Social  History  Substance Use Topics  . Smoking status: Never Smoker  . Smokeless tobacco: Never Used  . Alcohol use No     Allergies   Sulfonamide derivatives; Doxycycline; Latex; Ciprofloxacin; Fish allergy; Hydrocortisone; and Neomycin   Review of Systems Review of Systems  Respiratory: Positive for cough (productive), shortness of breath and wheezing.   All other systems reviewed and are negative.    Physical Exam Updated Vital Signs BP 107/90   Pulse 94   Temp 98.1 F (36.7 C) (Oral)   Resp 18   SpO2 97%   Physical Exam  Constitutional: She is oriented to person, place, and time. She appears well-developed and well-nourished.  Morbidly obese female who does not appear in respiratory distress.  HENT:  Head: Normocephalic and atraumatic.  Nose: Nose normal. Right sinus exhibits no maxillary sinus tenderness and no frontal sinus tenderness. Left sinus exhibits no maxillary sinus tenderness and no frontal sinus tenderness.  Mouth/Throat: Uvula is midline, oropharynx is clear and moist and mucous membranes are normal. No oropharyngeal exudate, posterior oropharyngeal edema, posterior oropharyngeal erythema or tonsillar abscesses. No tonsillar exudate.  Eyes: Conjunctivae and EOM are normal. Pupils are equal, round, and reactive to light. Right eye exhibits no discharge. Left eye exhibits no discharge. No scleral icterus.  Neck: Normal range of motion. Neck supple.  Cardiovascular: Normal rate, regular rhythm, normal heart sounds and intact distal pulses.   HR 93  Pulmonary/Chest: Effort normal and breath sounds normal. No respiratory distress. She has no wheezes. She has no rales. She exhibits no tenderness.  Abdominal: Soft. Bowel sounds are normal. She exhibits no distension and no mass. There is no tenderness. There is no rebound and no guarding.  Protuberant abdomen  Musculoskeletal: Normal range of motion. She exhibits no edema.  Neurological: She is alert and oriented to  person, place, and time.  Skin: Skin is warm and dry. Capillary refill takes less than 2 seconds.  Nursing note and vitals reviewed.    ED Treatments / Results  Labs (all labs ordered are listed, but only abnormal results are displayed) Labs Reviewed  LIPASE, BLOOD - Abnormal; Notable for the following:       Result Value   Lipase 72 (*)    All other components within normal limits  COMPREHENSIVE METABOLIC PANEL - Abnormal; Notable for the following:    Potassium 2.9 (*)    Chloride 96 (*)    Glucose, Bld 163 (*)    BUN 28 (*)    Creatinine, Ser 1.23 (*)    Total Protein 9.0 (*)    GFR calc non Af Amer 48 (*)    GFR calc Af Amer 56 (*)    All other components within normal limits  CBC - Abnormal; Notable for the following:    WBC 12.0 (*)    Hemoglobin 11.8 (*)    All other components within normal limits  D-DIMER, QUANTITATIVE (NOT AT [facility]) - Abnormal; Notable for the following:    D-Dimer, Quant 1.08 (*)    All other components within normal limits  URINALYSIS, ROUTINE W REFLEX MICROSCOPIC (NOT AT [facility])  BRAIN NATRIURETIC PEPTIDE  TROPONIN I    EKG  EKG Interpretation None       Radiology Dg Chest 2 View  Result Date: 10/29/2015 CLINICAL DATA:  Shortness of breath. EXAM: CHEST  2 VIEW COMPARISON:  March 27, 2015 FINDINGS: The heart size and mediastinal contours are within normal limits. Both lungs are clear. The visualized skeletal structures are unremarkable. IMPRESSION: No active cardiopulmonary disease. Electronically Signed   By: Dorise Bullion III M.D   On: 10/29/2015 12:29   Ct Angio Chest Pe W And/or Wo Contrast  Result Date: 10/29/2015 CLINICAL DATA:  Subacute onset of shortness of breath and right-sided abdominal pain. Initial encounter. EXAM: CT ANGIOGRAPHY CHEST WITH CONTRAST TECHNIQUE: Multidetector CT imaging of the chest was performed using the standard protocol during bolus administration of intravenous contrast. Multiplanar CT image reconstructions  and MIPs were obtained to evaluate the vascular anatomy. CONTRAST:  100 mL of Isovue 370 IV contrast COMPARISON:  CT of the chest performed 03/27/2015, and chest radiograph performed earlier today at 12:21 p.m. FINDINGS: Cardiovascular:  There is no evidence of pulmonary embolus. The heart is unremarkable in appearance. Scattered coronary artery calcifications seen. The heart is normal in size. The thoracic aorta is grossly unremarkable. The great vessels are within normal limits. Mediastinum/Nodes: The mediastinum is unremarkable in appearance. No mediastinal lymphadenopathy is seen. No pericardial effusion is identified. The visualized portions of the thyroid gland are unremarkable. Lungs/Pleura: The lungs appear clear bilaterally. No focal consolidation, pleural effusion or pneumothorax is seen. No masses are seen. Upper Abdomen: The spleen and visualized portions of the liver are unremarkable. The visualized portions of the pancreas, adrenal glands and left kidney are within normal limits. A tiny hiatal hernia is noted. Musculoskeletal: No acute osseous abnormalities are identified. The visualized musculature is unremarkable in appearance. Review of the MIP images confirms the above findings. IMPRESSION: 1. No evidence of pulmonary embolus. 2. Lungs clear bilaterally. 3. Scattered coronary artery calcifications seen. 4. Tiny hiatal hernia noted. Electronically Signed   By: Garald Balding M.D.   On: 10/29/2015 21:14    Procedures Procedures (including critical care time)  Medications Ordered in ED Medications  albuterol (PROVENTIL) (2.5 MG/3ML) 0.083% nebulizer solution 5 mg (5 mg Nebulization Given 10/29/15 1319)  potassium chloride SA (K-DUR,KLOR-CON) CR tablet 40 mEq (40 mEq Oral Given 10/29/15 1636)  sodium chloride 0.9 % bolus 1,000 mL (1,000 mLs Intravenous New Bag/Given 10/29/15 1752)  iopamidol (ISOVUE-370) 76 % injection 100 mL (100 mLs Intravenous Contrast Given 10/29/15 2036)     Initial  Impression / Assessment and Plan / ED Course  I have reviewed the triage vital signs and the  nursing notes.  Pertinent labs & imaging results that were available during my care of the patient were reviewed by me and considered in my medical decision making (see chart for details).  Clinical Course   Patient presents with shortness of breath that has been present for the past 2 weeks. She states she was initially seen by her PCP, diagnosed with URI and given Augmentin for 2 weeks. Denies improvement. Endorses associated intermittent wheezing and cough. Denies fever or chest pain. VSS. Exam unremarkable. Lungs clear to auscultation bilaterally.  Troponin negative. BMP 25. Potassium 2.9, patient given oral potassium supplement in the ED. Creatinine mildly elevated at 1.23 (chart review shows baseline 0.8-1.0). WBC 12 (chart review shows mild leukocytosis at baseline). Chest x-ray negative. EKG showed sinus tachycardia, rate 106, ST elevation noted in inferior leads appears chronic and unchanged from prior. Discussed case with Dr. Sherry Ruffing who evaluated the patient in the ED. Will order d-dimer to evaluate for PE.   On reevaluation patient is resting comfortably in bed and reports her shortness of breath has improved. Denies any pain or complaints at this time.  VSS, HR 88. D-dimer elevated at 1.08. CT chest angiogram ordered for further evaluation.  CT chest negative for PE. On reevaluation patient is resting comfortably in bed and continues to report improvement of her breathing. VSS. Discussed results and plan for discharge with patient. Plan to discharge patient home with symptomatically treatment advised her to continue taking her home medications as prescribed including using her albuterol inhaler as needed for shortness of breath/wheezing. Advised patient to follow up with her PCP in the next 48 hours to have her labs rechecked regarding her elevated creatinine and hypokalemia. Discussed strict return  precautions.  Final Clinical Impressions(s) / ED Diagnoses   Final diagnoses:  SOB (shortness of breath)  Elevated serum creatinine    New Prescriptions New Prescriptions   No medications on file     Nona Dell, [place] 10/29/15 2123    Gwenyth Allegra Tegeler, MD 10/30/15 [phone]

## 2015-10-29 NOTE — ED Triage Notes (Signed)
Pt c/o SOB onset several weeks ago, right side abdominal pain onset 2 days ago. No emesis, diarrhea. Pt being treated for URI, finished Augmentin Tuesday. Pt took albuterol nebulizer tx today at 0900. Pt having problems with her "sleep apnea" machine.

## 2015-10-30 ENCOUNTER — Ambulatory Visit: Payer: 59 | Admitting: Physician Assistant

## 2015-11-04 ENCOUNTER — Other Ambulatory Visit: Payer: Self-pay | Admitting: Internal Medicine

## 2015-11-06 ENCOUNTER — Other Ambulatory Visit: Payer: Self-pay | Admitting: Family Medicine

## 2015-11-06 DIAGNOSIS — R600 Localized edema: Secondary | ICD-10-CM

## 2015-11-07 ENCOUNTER — Ambulatory Visit (INDEPENDENT_AMBULATORY_CARE_PROVIDER_SITE_OTHER): Payer: 59 | Admitting: Physician Assistant

## 2015-11-07 ENCOUNTER — Encounter: Payer: Self-pay | Admitting: Physician Assistant

## 2015-11-07 ENCOUNTER — Telehealth: Payer: Self-pay | Admitting: Adult Health

## 2015-11-07 VITALS — BP 116/72 | HR 98 | Ht 63.0 in | Wt 293.8 lb

## 2015-11-07 DIAGNOSIS — I5032 Chronic diastolic (congestive) heart failure: Secondary | ICD-10-CM

## 2015-11-07 DIAGNOSIS — I739 Peripheral vascular disease, unspecified: Secondary | ICD-10-CM

## 2015-11-07 DIAGNOSIS — I10 Essential (primary) hypertension: Secondary | ICD-10-CM

## 2015-11-07 DIAGNOSIS — E876 Hypokalemia: Secondary | ICD-10-CM

## 2015-11-07 MED ORDER — PANTOPRAZOLE SODIUM 40 MG PO TBEC: DELAYED_RELEASE_TABLET | ORAL | 3 refills | Status: DC

## 2015-11-07 NOTE — Patient Instructions (Addendum)
Medication Instructions:  Your physician recommends that you continue on your current medications as directed. Please refer to the Current Medication list given to you today.  Labwork: None-will request labs from pcp  Testing/Procedures: Your physician has requested that you have a lower extremity arterial duplex. This test is an ultrasound of the arteries in the legs. It looks at arterial blood flow in the legs. Allow one hour for Lower Arterial scans. There are no restrictions or special instructions   Follow-Up: Your physician wants you to follow-up in: 6 months with Dr Percival Spanish. You will receive a reminder letter in the mail two months in advance. If you don't receive a letter, please call our office to schedule the follow-up appointment.   Any Other Special Instructions Will Be Listed Below (If Applicable).  Continue Checking your weight Continue your low sodium diet  Continue Volume management  If you need a refill on your cardiac medications before your next appointment, please call your pharmacy.

## 2015-11-07 NOTE — Progress Notes (Signed)
Cardiology Office Note   Date:  11/07/2015   ID:  JEVONNE FRASE, DOB 1960/03/15, MRN [id number]  PCP:  Bartholome Bill, MD  Cardiologist:  Dr Percival Spanish 08/2015 Charlotte Ferries, PA-C   Chief Complaint  Patient presents with  . Follow-up    medication evaluation    History of Present Illness: [name] is a 55 y.o. female with a history of HTN, HLD, OSA, D-CHF, no sig CAD at cath 2001, ?asthma  09/18 Rexene Edison NP saw for URI, started on Aumentin, albuterol MDI and Mucinex, codeine cough syrup 10/08 ER visit for SOB, K+ 2.9, CT neg PE, No clear cause found symptoms improved by discharge  [name] presents for Medication management.  Her breathing has begun to improve. She feels like her respiratory status is at baseline. She is weighing herself daily and takes metolazone about once a week when her weight goes up 3 pounds. She is eating less and watching her fluid intake. She gets thirsty at times, but will eat Dorcas Mcmurray Fruit, which helps. She dislikes taking the metolazone because it makes her so tired.   She was trying to do PFTs at the pulmonologist's office, but could not complete them. 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 She has not had any exertional chest pain. She reports nighttime burning and numbness in her legs and feet at times. She also notes a cramping pain in her calves when she walks. When she eats too much, she gets constipated. She is managing this by eating less. She hopes to lose weight.   Past Medical History:  Diagnosis Date  . Acute on chronic diastolic CHF (congestive heart failure) ([place]) 04/11/2014  . Acute suppurative otitis media without spontaneous rupture of eardrum 02/04/2007   Centricity Description: OTITIS MEDIA, SUPPURATIVE, ACUTE, BILATERAL Qualifier: Diagnosis of  By:  [name] MD, [name]  Centricity Description: OTITIS MEDIA, SUPPURATIVE, ACUTE Qualifier: Diagnosis of  By: [name] MD, [name]   . Allergic rhinitis   . CAD (coronary artery disease)    LHC (11/29/1999):  EF 60%; no significant CAD.  [name] Carpal tunnel syndrome   . Carpal tunnel syndrome 06/09/2006   Qualifier: Diagnosis of  By: Marca Ancona RMA, Lucy    . Chronic diastolic CHF (congestive heart failure) ([place])    a. Echo (01/21/11):  Vigorous LVF, EF 65-70%, no RWMA, Gr 2 DD. ;  b.  Echo (12/15): Moderate LVH, EF 123456, grade 2 diastolic dysfunction, mild LAE, normal RV function  . Common migraine   . Diastolic heart failure ([place])   . Diverticulitis   . Hepatic steatosis   . Hepatomegaly   . Hiatal hernia   . Hyperlipidemia   . Hypertension   . IBS (irritable bowel syndrome)   . Internal hemorrhoids   . Morbid obesity ([place])   . OSA (obstructive sleep apnea)    CPAP dependent  . PNEUMONIA, ORGAN UNSPECIFIED 03/01/2009   Qualifier: Diagnosis of  By: [name] CMA, [name]    . Sinusitis, chronic 03/10/2012   CT sinuses 02/2012:  Chronic sinusitis with small A/F levels >> rx by ENT with prolonged augmentin F/u ct sinuses 04/2012:  Persistent sinus thickening >> rx with levaquin and clinda for 30 days.  Told to return for sinus scan in 6 weeks >> no showed for followup  visit.  CT sinuses 11/2013:  Acute and chronic sinusitis noted.      Past Surgical History:  Procedure Laterality Date  . CESAREAN SECTION    . NASAL SINUS SURGERY    . PARTIAL HYSTERECTOMY    . TONSILLECTOMY    . TUBAL LIGATION    . UVULOPALATOPHARYNGOPLASTY      Current Outpatient Prescriptions  Medication Sig Dispense Refill  . albuterol (PROVENTIL) (2.5 MG/3ML) 0.083% nebulizer solution Take 3 mLs (2.5 mg total) by nebulization every 6 (six) hours as needed for wheezing or shortness of breath (Dx: J44.1). 75 mL 3  . aspirin 81 MG EC tablet Take 1 tablet (81 mg total) by mouth daily. 90 tablet 3  . BIDIL 20-37.5 MG tablet Take 1  tablet by mouth 3 (three) times daily.    . budesonide-formoterol (SYMBICORT) 80-4.5 MCG/ACT inhaler Inhale 2 puffs into the lungs 2 (two) times daily as needed (For shortness of breath.).     [name] chlorpheniramine (CHLOR-TRIMETON) 4 MG tablet Take 4 mg by mouth at bedtime as needed for allergies.    [name] dextromethorphan-guaiFENesin (MUCINEX DM) 30-600 MG 12hr tablet Take 1 tablet by mouth 2 (two) times daily as needed.     . famotidine (PEPCID) 20 MG tablet One at bedtime (Patient taking differently: Take 20 mg by mouth 2 (two) times daily as needed for heartburn. )    . LITTLE NOSES SALINE NA Place 1 spray into the nose as needed (For congestion.).     [name] losartan (COZAAR) 100 MG tablet TAKE 1 TABLET (100 MG TOTAL) BY MOUTH DAILY. 90 tablet 1  . meclizine (ANTIVERT) 25 MG tablet Take 25 mg by mouth 3 (three) times daily as needed for dizziness.    . metolazone (ZAROXOLYN) 2.5 MG tablet Take 1 tablet (2.5 mg total) by mouth daily as needed. see notes (Patient taking differently: Take 2.5 mg by mouth daily as needed (For fluid.). ) 90 tablet 1  . montelukast (SINGULAIR) 10 MG tablet Take 1 tablet (10 mg total) by mouth at bedtime. 90 tablet 2  . pantoprazole (PROTONIX) 40 MG tablet TAKE 1 TABLET BY MOUTH DAILY, TAKE 30 TO 60 MINUTES BEFORE FIRST MEAL OF THE DAY 30 tablet 5  . potassium chloride SA (K-DUR,KLOR-CON) 20 MEQ tablet TAKE 2 TABLET BY MOUTH THREE TIMES DAILY AND 1 TABLET EXTRA AS NEEDED (Patient taking differently: TAKE 2 TABLET BY MOUTH THREE TIMES DAILY AND 1 TABLET EXTRA AS NEEDED FOR POTASSIUM LOSS.) 217 tablet 2  . promethazine-codeine (PHENERGAN WITH CODEINE) 6.25-10 MG/5ML syrup Take 5 mLs by mouth every 6 (six) hours as needed for cough. 200 mL 0  . spironolactone (ALDACTONE) 25 MG tablet Take 0.5 tablets (12.5 mg total) by mouth 2 (two) times daily. 90 tablet 3  . Tetrahydroz-Dextran-PEG-Povid (EYE DROPS ADVANCED RELIEF OP) Place 1 drop into both eyes as needed (For eye irritation.).    [name]  topiramate (TOPAMAX) 25 MG tablet Take 25 mg by mouth 2 (two) times daily as needed (for headaches).     . torsemide (DEMADEX) 20 MG tablet Take 2 tablets (40 mg total) by mouth 2 (two) times daily. 120 tablet 9  . traMADol (ULTRAM) 50 MG tablet Take 1 tablet (50 mg total) by mouth every 6 (six) hours as needed for moderate pain. 40 tablet 0   No current facility-administered medications for this visit.     Allergies:   Sulfonamide derivatives; Doxycycline; Latex; Ciprofloxacin; Fish allergy; Hydrocortisone; and Neomycin    Social History:  The patient  reports that she has never smoked. She has never used smokeless tobacco. She reports that she does not drink alcohol or use drugs.   Family History:  The patient's family history includes Asthma in her sister; Heart attack in her mother; Heart disease in her mother; Hypertension in her mother.    ROS:  Please see the history of present illness. All other systems are reviewed and negative.    PHYSICAL EXAM: VS:  BP 116/72   Pulse 98   Ht 5\' 3"  (1.6 m)   Wt 293 lb 12.8 oz (133.3 kg)   BMI 52.04 kg/m  , BMI Body mass index is 52.04 kg/m. GEN: Well nourished, well developed, female in no acute distress  HEENT: normal for age  Neck: no JVDSeen but difficult to assess secondary to body habitus, no carotid bruit, no masses Cardiac: RRR; faint murmur, no rubs, or gallops Respiratory:  Decreased BS bases but clear bilaterally, normal work of breathing GI: soft, nontender, nondistended, + BS MS: no deformity or atrophy; no edema; distal pulses are 2+ in all 4 extremities   Skin: warm and dry, no rash Neuro:  Strength and sensation are intact Psych: euthymic mood, full affect   EKG:  EKG is not ordered today.  Recent Labs: 03/27/2015: TSH 1.546 03/28/2015: Magnesium 2.5 10/29/2015: ALT 24; B Natriuretic Peptide 25.2; BUN 28; Creatinine, Ser 1.23; Hemoglobin 11.8; Platelets 308; Potassium 2.9; Sodium 135    Lipid Panel    Component  Value Date/Time   CHOL 169 02/20/2015 0440   TRIG 310 (H) 02/20/2015 0440   HDL 35 (L) 02/20/2015 0440   CHOLHDL 4.8 02/20/2015 0440   VLDL 62 (H) 02/20/2015 0440   LDLCALC 72 02/20/2015 0440     Wt Readings from Last 3 Encounters:  11/07/15 293 lb 12.8 oz (133.3 kg)  10/09/15 290 lb (131.5 kg)  08/25/15 295 lb (133.8 kg)     Other studies Reviewed: Additional studies/ records that were reviewed today include: Office notes, hospital records and testing.  ASSESSMENT AND PLAN:  1.  Chronic diastolic CHF: Her weight is up 3 pounds in the past month, but the patient did not take metolazone this a.m. because of her appointment here today. She is encouraged to continue to be compliant with the Demadex twice a day and metolazone when necessary.  2. Hypokalemia: She requires significant potassium supplementation and is currently taking 100 mEq daily. Her potassium level was 2.9 in a recent ER visit and 100 milliequivalents may not be enough with the metolazone being taken when necessary. She is to obtain copies of the recent labs from her PCP.  3. Claudication symptoms: The burning and numbness she describes at bedtime sound more like neuropathy. However, the cramping pain she gets in her calves with ambulation is concerning. She has palpable distal pulses, but they are a little weak. Her primary care physician was going to obtain lower extremity arterial Doppler's and ABIs. We will be happy to obtain these in our office in follow-up on the results.  4. Hypertension: Her blood pressures under good control now. She had some headaches initially when she started taking the BiDil, but those have improved. She understands the need to take the diuretics despite her high potassium needs. She is not having any side effects from the losartan. Continue current therapy.  Current medicines are reviewed at length with the patient today.  The patient does not have concerns regarding medicines.  The following  changes have been  made:  no change  Labs/ tests ordered today include:   lower extremity arterial Dopplers  Disposition:   FU with Dr. Percival Spanish  Signed, Charlotte Ferries, PA-C  11/07/2015 10:26 AM    [place] Phone: [phone]; Fax: [phone]  This note was written with the assistance of speech recognition software. Please excuse any transcriptional errors.

## 2015-11-07 NOTE — Telephone Encounter (Signed)
Patient states that she did not receive her Pantoprazole.  Noticed that medication was sent to [facility] instead of [facility].  Patient's insurance requires that patient get medications 90 days at [facility].  Rx sent to [facility].  Patient aware.  Nothing further needed.

## 2015-11-20 ENCOUNTER — Ambulatory Visit
Admission: RE | Admit: 2015-11-20 | Discharge: 2015-11-20 | Disposition: A | Discharge status: Home / Self Care | Payer: 59 | Source: Ambulatory Visit | Attending: Family Medicine | Admitting: Family Medicine

## 2015-11-20 DIAGNOSIS — Z1231 Encounter for screening mammogram for malignant neoplasm of breast: Secondary | ICD-10-CM

## 2015-11-27 ENCOUNTER — Encounter (HOSPITAL_COMMUNITY): Payer: 59

## 2015-11-28 ENCOUNTER — Other Ambulatory Visit: Payer: Self-pay | Admitting: Cardiology

## 2015-11-29 ENCOUNTER — Other Ambulatory Visit: Payer: Self-pay | Admitting: Physician Assistant

## 2015-11-29 ENCOUNTER — Ambulatory Visit (HOSPITAL_COMMUNITY)
Admission: RE | Admit: 2015-11-29 | Discharge: 2015-11-29 | Disposition: A | Discharge status: Home / Self Care | Payer: 59 | Source: Ambulatory Visit | Attending: Cardiovascular Disease | Admitting: Cardiovascular Disease

## 2015-11-29 DIAGNOSIS — I739 Peripheral vascular disease, unspecified: Secondary | ICD-10-CM | POA: Diagnosis not present

## 2015-11-29 DIAGNOSIS — I10 Essential (primary) hypertension: Secondary | ICD-10-CM | POA: Insufficient documentation

## 2015-11-29 DIAGNOSIS — E785 Hyperlipidemia, unspecified: Secondary | ICD-10-CM | POA: Diagnosis not present

## 2015-11-29 DIAGNOSIS — J449 Chronic obstructive pulmonary disease, unspecified: Secondary | ICD-10-CM | POA: Insufficient documentation

## 2015-12-03 ENCOUNTER — Other Ambulatory Visit: Payer: Self-pay | Admitting: Internal Medicine

## 2015-12-03 ENCOUNTER — Other Ambulatory Visit: Payer: Self-pay | Admitting: Pulmonary Disease

## 2015-12-03 ENCOUNTER — Other Ambulatory Visit: Payer: Self-pay | Admitting: Cardiology

## 2015-12-03 DIAGNOSIS — I5032 Chronic diastolic (congestive) heart failure: Secondary | ICD-10-CM

## 2015-12-03 DIAGNOSIS — I10 Essential (primary) hypertension: Secondary | ICD-10-CM

## 2015-12-04 ENCOUNTER — Other Ambulatory Visit: Payer: Self-pay | Admitting: Internal Medicine

## 2015-12-06 ENCOUNTER — Encounter (HOSPITAL_COMMUNITY): Payer: Self-pay

## 2015-12-06 ENCOUNTER — Emergency Department (HOSPITAL_COMMUNITY)
Admission: EM | Admit: 2015-12-06 | Discharge: 2015-12-06 | Disposition: A | Discharge status: Home / Self Care | Payer: No Typology Code available for payment source | Attending: Emergency Medicine | Admitting: Emergency Medicine

## 2015-12-06 ENCOUNTER — Emergency Department (HOSPITAL_COMMUNITY): Payer: No Typology Code available for payment source

## 2015-12-06 DIAGNOSIS — J449 Chronic obstructive pulmonary disease, unspecified: Secondary | ICD-10-CM | POA: Diagnosis not present

## 2015-12-06 DIAGNOSIS — Z7982 Long term (current) use of aspirin: Secondary | ICD-10-CM | POA: Insufficient documentation

## 2015-12-06 DIAGNOSIS — I110 Hypertensive heart disease with heart failure: Secondary | ICD-10-CM | POA: Insufficient documentation

## 2015-12-06 DIAGNOSIS — Z91040 Latex allergy status: Secondary | ICD-10-CM | POA: Diagnosis not present

## 2015-12-06 DIAGNOSIS — I5033 Acute on chronic diastolic (congestive) heart failure: Secondary | ICD-10-CM | POA: Diagnosis not present

## 2015-12-06 DIAGNOSIS — R109 Unspecified abdominal pain: Secondary | ICD-10-CM

## 2015-12-06 DIAGNOSIS — R1084 Generalized abdominal pain: Secondary | ICD-10-CM | POA: Diagnosis not present

## 2015-12-06 DIAGNOSIS — I2510 Atherosclerotic heart disease of native coronary artery without angina pectoris: Secondary | ICD-10-CM | POA: Diagnosis not present

## 2015-12-06 DIAGNOSIS — R1031 Right lower quadrant pain: Secondary | ICD-10-CM | POA: Diagnosis present

## 2015-12-06 LAB — COMPREHENSIVE METABOLIC PANEL
ALT: 18 U/L (ref 14–54)
AST: 28 U/L (ref 15–41)
Albumin: 3.4 g/dL — ABNORMAL LOW (ref 3.5–5.0)
Alkaline Phosphatase: 76 U/L (ref 38–126)
Anion gap: 8 (ref 5–15)
BUN: 15 mg/dL (ref 6–20)
CO2: 26 mmol/L (ref 22–32)
Calcium: 9 mg/dL (ref 8.9–10.3)
Chloride: 104 mmol/L (ref 101–111)
Creatinine, Ser: 0.94 mg/dL (ref 0.44–1.00)
GFR calc Af Amer: >60 mL/min (ref >60)
GFR calc non Af Amer: >60 mL/min (ref >60)
Glucose, Bld: 140 mg/dL — ABNORMAL HIGH (ref 65–99)
Potassium: 3.4 mmol/L — ABNORMAL LOW (ref 3.5–5.1)
Sodium: 138 mmol/L (ref 135–145)
Total Bilirubin: 0.8 mg/dL (ref 0.3–1.2)
Total Protein: 7.5 g/dL (ref 6.5–8.1)

## 2015-12-06 LAB — URINALYSIS, ROUTINE W REFLEX MICROSCOPIC
Bilirubin Urine: NEGATIVE
Glucose, UA: NEGATIVE mg/dL
Hgb urine dipstick: NEGATIVE
Ketones, ur: NEGATIVE mg/dL
Leukocytes, UA: NEGATIVE
Nitrite: NEGATIVE
Protein, ur: NEGATIVE mg/dL
Specific Gravity, Urine: 1.016 (ref 1.005–1.030)
pH: 6 (ref 5.0–8.0)

## 2015-12-06 LAB — CBC
HCT: 31.4 % — ABNORMAL LOW (ref 36.0–46.0)
Hemoglobin: 10.4 g/dL — ABNORMAL LOW (ref 12.0–15.0)
MCH: 30.6 pg (ref 26.0–34.0)
MCHC: 33.1 g/dL (ref 30.0–36.0)
MCV: 92.4 fL (ref 78.0–100.0)
Platelets: 280 10*3/uL (ref 150–400)
RBC: 3.4 MIL/uL — ABNORMAL LOW (ref 3.87–5.11)
RDW: 13.9 % (ref 11.5–15.5)
WBC: 10.2 10*3/uL (ref 4.0–10.5)

## 2015-12-06 LAB — LIPASE, BLOOD: Lipase: 54 U/L — ABNORMAL HIGH (ref 11–51)

## 2015-12-06 LAB — POC URINE PREG, ED: Preg Test, Ur: NEGATIVE

## 2015-12-06 MED ORDER — IOPAMIDOL (ISOVUE-300) INJECTION 61%
100.0000 mL | Freq: Once | INTRAVENOUS | Status: AC | PRN
  Administered 2015-12-06: 100 mL via INTRAVENOUS

## 2015-12-06 MED ORDER — IOPAMIDOL (ISOVUE-300) INJECTION 61%
30.0000 mL | Freq: Once | INTRAVENOUS | Status: AC | PRN
  Administered 2015-12-06: 30 mL via ORAL

## 2015-12-06 MED ORDER — MORPHINE SULFATE (PF) 4 MG/ML IV SOLN
4.0000 mg | Freq: Once | INTRAVENOUS | Status: AC
  Administered 2015-12-06: 4 mg via INTRAVENOUS
  Filled 2015-12-06: qty 1

## 2015-12-06 NOTE — ED Notes (Signed)
Patient transported to CT 

## 2015-12-06 NOTE — ED Notes (Signed)
Pt wants to take her home meds, OK per PA. Pt given ginger ale per her request.

## 2015-12-06 NOTE — ED Triage Notes (Signed)
She reports right-sided abd. Pain x 2 days. She states this is very similar to pain she felt about a year ago, at which time she was dx with diverticulitis.  She denies vomiting/diarrhea and is in no distress.

## 2015-12-06 NOTE — ED Notes (Signed)
Pt refused discharge vital signs, she is upset that she is being discharged, sts she called her PCP and they questioned why wasn't she admitted. Explained to pt based on her exam, blood work and CT results, pt does not need admission.

## 2015-12-06 NOTE — Discharge Instructions (Signed)
Please read attached information. If you experience any new or worsening signs or symptoms please return to the emergency room for evaluation. Please follow-up with your primary care provider or specialist as discussed.  °

## 2015-12-06 NOTE — ED Provider Notes (Signed)
[facility] DEPT Provider Note   CSN: [id number] Arrival date & time: 12/06/15  0712     History   Chief Complaint Chief Complaint  Patient presents with  . Abdominal Pain    HPI [name] is a 55 y.o. female.  HPI   17 YOF presents today with abdominal pain. Pt started Having symptoms around 70 at 3 AM patient reports that she was sleeping and was woken up with a sharp pain in her right side. She notes both right upper quadrant and right lower quadrant abdominal pain. She reports this feels similar to previous episode of diverticulitis, but that was located on her left side. Patient denies any nausea or vomiting, denies any chest pain or shortness of breath. She denied any dysuria, fever, chills, constipation, diarrhea. She reports a history of acid reflux, thought initially that that was the cause of her symptoms, taken an antacid which did not improve her symptoms. She reports the pain is worse with palpation, worse with different positioning.    Past Medical History:  Diagnosis Date  . Acute on chronic diastolic CHF (congestive heart failure) ([place]) 04/11/2014  . Acute suppurative otitis media without spontaneous rupture of eardrum 02/04/2007   Centricity Description: OTITIS MEDIA, SUPPURATIVE, ACUTE, BILATERAL Qualifier: Diagnosis of  By: [name] MD, [name]  Centricity Description: OTITIS MEDIA, SUPPURATIVE, ACUTE Qualifier: Diagnosis of  By: [name] MD, [name]   . Allergic rhinitis   . CAD (coronary artery disease)    LHC (11/29/1999):  EF 60%; no significant CAD.  [name] Carpal tunnel syndrome   . Carpal tunnel syndrome 06/09/2006   Qualifier: Diagnosis of  By: Marca Ancona RMA, Lucy    . Chronic diastolic CHF (congestive heart failure) ([place])    a. Echo (01/21/11):  Vigorous LVF, EF 65-70%, no RWMA, Gr 2 DD. ;  b.  Echo (12/15): Moderate LVH, EF 123456, grade 2 diastolic dysfunction, mild LAE, normal RV function  . Common migraine   . Diastolic heart failure ([place])   .  Diverticulitis   . Hepatic steatosis   . Hepatomegaly   . Hiatal hernia   . Hyperlipidemia   . Hypertension   . IBS (irritable bowel syndrome)   . Internal hemorrhoids   . Morbid obesity ([place])   . OSA (obstructive sleep apnea)    CPAP dependent  . PNEUMONIA, ORGAN UNSPECIFIED 03/01/2009   Qualifier: Diagnosis of  By: [name] CMA, [name]    . Sinusitis, chronic 03/10/2012   CT sinuses 02/2012:  Chronic sinusitis with small A/F levels >> rx by ENT with prolonged augmentin F/u ct sinuses 04/2012:  Persistent sinus thickening >> rx with levaquin and clinda for 30 days.  Told to return for sinus scan in 6 weeks >> no showed for followup visit.  CT sinuses 11/2013:  Acute and chronic sinusitis noted.      Patient Active Problem List   Diagnosis Date Noted  . Cough variant asthma 07/31/2015  . Upper airway cough syndrome 04/11/2015  . Severe obesity (BMI >= 40) ([place]) 04/11/2015  . Bronchitis, chronic obstructive w acute bronchitis ([place]) 04/11/2015  . Dyspnea   . Acute on chronic diastolic CHF (congestive heart failure), NYHA class 2 ([place]) 03/27/2015  . Morbid obesity due to excess calories ([place]) 03/27/2015  . Dysphagia 03/27/2015  . Hyponatremia 03/27/2015  . Acute kidney injury ([place]) 03/27/2015  . Hypokalemia 03/27/2015  . Leukocytosis 03/27/2015  . COPD exacerbation ([place]) 03/27/2015  . Sinusitis, chronic 03/10/2012  . Hyperlipidemia 10/02/2006  .  Obstructive sleep apnea 06/09/2006  . Essential hypertension 06/09/2006    Past Surgical History:  Procedure Laterality Date  . CESAREAN SECTION    . NASAL SINUS SURGERY    . PARTIAL HYSTERECTOMY    . TONSILLECTOMY    . TUBAL LIGATION    . UVULOPALATOPHARYNGOPLASTY      OB History    No data available       Home Medications    Prior to Admission medications   Medication Sig Start Date End Date Taking? Authorizing Provider  albuterol (PROVENTIL) (2.5 MG/3ML) 0.083% nebulizer solution USE 3MLS (1 VIAL ) VIA NEBULIZER EVERY 6  HOURS FOR SHORTNESS OF BREATH OR WHEEZING 12/04/15  Yes Juanito Doom, MD  aspirin 81 MG EC tablet Take 1 tablet (81 mg total) by mouth daily. 07/03/15  Yes Almyra Deforest, PA  BIDIL 20-37.5 MG tablet Take 1 tablet by mouth 3 (three) times daily. 09/11/15  Yes Historical Provider, MD  budesonide-formoterol (SYMBICORT) 80-4.5 MCG/ACT inhaler Inhale 2 puffs into the lungs 2 (two) times daily as needed (For shortness of breath.).    Yes Historical Provider, MD  chlorpheniramine (CHLOR-TRIMETON) 4 MG tablet Take 4 mg by mouth at bedtime as needed for allergies.   Yes Historical Provider, MD  famotidine (PEPCID) 20 MG tablet One at bedtime Patient taking differently: Take 20 mg by mouth 2 (two) times daily as needed for heartburn.  04/07/15  Yes Tanda Rockers, MD  lactulose (CHRONULAC) 10 GM/15ML solution Take 15 mLs by mouth 2 (two) times daily as needed for constipation. 12/04/15  Yes Historical Provider, MD  linaclotide (LINZESS) 145 MCG CAPS capsule Take 145 mcg by mouth daily as needed (constipation).   Yes Historical Provider, MD  LITTLE NOSES SALINE NA Place 1 spray into the nose every other day.    Yes Historical Provider, MD  losartan (COZAAR) 100 MG tablet TAKE 1 TABLET (100 MG TOTAL) BY MOUTH DAILY. 09/26/15  Yes Minus Breeding, MD  meclizine (ANTIVERT) 25 MG tablet Take 25 mg by mouth 3 (three) times daily as needed for dizziness.   Yes Historical Provider, MD  metolazone (ZAROXOLYN) 2.5 MG tablet TAKE 1 TABLET DAILY 30 MIN. BEFORE LASIX(TAKE ONLY IF WEIGHT INCREASE NOT IMPROVED WITH LASIX) 11/28/15  Yes Minus Breeding, MD  montelukast (SINGULAIR) 10 MG tablet Take 1 tablet (10 mg total) by mouth at bedtime. 10/14/15  Yes Juanito Doom, MD  pantoprazole (PROTONIX) 40 MG tablet TAKE 1 TABLET BY MOUTH DAILY, TAKE 30 TO 60 MINUTES BEFORE FIRST MEAL OF THE DAY 11/07/15  Yes Tanda Rockers, MD  potassium chloride SA (K-DUR,KLOR-CON) 20 MEQ tablet TAKE 2 TABLET BY MOUTH THREE TIMES DAILY AND 1 TABLET  EXTRA AS NEEDED Patient taking differently: TAKE 2 TABLET BY MOUTH THREE TIMES DAILY AND 1 TABLET EXTRA AS NEEDED FOR POTASSIUM LOSS. 10/10/15  Yes Minus Breeding, MD  spironolactone (ALDACTONE) 25 MG tablet Take 0.5 tablets (12.5 mg total) by mouth 2 (two) times daily. 10/10/15  Yes Minus Breeding, MD  Tetrahydroz-Dextran-PEG-Povid (EYE DROPS ADVANCED RELIEF OP) Place 1 drop into both eyes as needed (For eye irritation.).   Yes Historical Provider, MD  topiramate (TOPAMAX) 25 MG tablet Take 25 mg by mouth 2 (two) times daily as needed (for headaches).  03/03/15  Yes Historical Provider, MD  torsemide (DEMADEX) 20 MG tablet Take 2 tablets (40 mg total) by mouth 2 (two) times daily. 08/25/15 12/06/15 Yes Minus Breeding, MD  traMADol (ULTRAM) 50 MG tablet Take 1 tablet (  50 mg total) by mouth every 6 (six) hours as needed for moderate pain. 07/31/15  Yes Tanda Rockers, MD  dicyclomine (BENTYL) 10 MG capsule TAKE 1 CAPSULE(10 MG) BY MOUTH FOUR TIMES DAILY BEFORE MEALS AND AT BEDTIME Patient not taking: Reported on 12/06/2015 12/04/15   Irene Shipper, MD  promethazine-codeine [facility] WITH CODEINE) 6.25-10 MG/5ML syrup Take 5 mLs by mouth every 6 (six) hours as needed for cough. Patient not taking: Reported on 12/06/2015 10/09/15   Melvenia Needles, NP    Family History Family History  Problem Relation Age of Onset  . Heart disease Mother   . Heart attack Mother   . Hypertension Mother   . Coronary artery disease      1st degree relatvie <50  . Breast cancer      aunt- ? paternal or maternal  . Asthma Sister   . Stroke Neg Hx     Social History Social History  Substance Use Topics  . Smoking status: Never Smoker  . Smokeless tobacco: Never Used  . Alcohol use No     Allergies   Sulfonamide derivatives; Doxycycline; Latex; Ciprofloxacin; Fish allergy; Hydrocortisone; and Neomycin   Review of Systems Review of Systems  All other systems reviewed and are negative.    Physical  Exam Updated Vital Signs BP 105/55   Pulse 70   Temp 98.5 F (36.9 C) (Oral)   Resp 16   SpO2 97%   Physical Exam  Constitutional: She is oriented to person, place, and time. She appears well-developed and well-nourished.  HENT:  Head: Normocephalic and atraumatic.  Eyes: Conjunctivae are normal. Pupils are equal, round, and reactive to light. Right eye exhibits no discharge. Left eye exhibits no discharge. No scleral icterus.  Neck: Normal range of motion. No JVD present. No tracheal deviation present.  Pulmonary/Chest: Effort normal. No stridor.  Abdominal:  Diffuse abdominal TTP, Worse in RLQ  Neurological: She is alert and oriented to person, place, and time. Coordination normal.  Psychiatric: She has a normal mood and affect. Her behavior is normal. Judgment and thought content normal.  Nursing note and vitals reviewed.    ED Treatments / Results  Labs (all labs ordered are listed, but only abnormal results are displayed) Labs Reviewed  LIPASE, BLOOD - Abnormal; Notable for the following:       Result Value   Lipase 54 (*)    All other components within normal limits  COMPREHENSIVE METABOLIC PANEL - Abnormal; Notable for the following:    Potassium 3.4 (*)    Glucose, Bld 140 (*)    Albumin 3.4 (*)    All other components within normal limits  CBC - Abnormal; Notable for the following:    RBC 3.40 (*)    Hemoglobin 10.4 (*)    HCT 31.4 (*)    All other components within normal limits  URINALYSIS, ROUTINE W REFLEX MICROSCOPIC (NOT AT [facility]) - Abnormal; Notable for the following:    APPearance CLOUDY (*)    All other components within normal limits  POC URINE PREG, ED    EKG  EKG Interpretation None       Radiology Ct Abdomen Pelvis W Contrast  Result Date: 12/06/2015 CLINICAL DATA:  Acute right lower quadrant abdominal pain. EXAM: CT ABDOMEN AND PELVIS WITH CONTRAST TECHNIQUE: Multidetector CT imaging of the abdomen and pelvis was performed using the  standard protocol following bolus administration of intravenous contrast. CONTRAST:  130mL ISOVUE-300 IOPAMIDOL (ISOVUE-300) INJECTION 61% COMPARISON:  CT scan of March 28, 2015. FINDINGS: Lower chest: Visualized lung bases are unremarkable. Hepatobiliary: No gallstones are noted.  Liver appears normal. Pancreas: Normal. Spleen: Normal. Adrenals/Urinary Tract: Adrenal glands and kidneys appear normal. No hydronephrosis or renal obstruction is noted. No renal or ureteral calculi are noted. Urinary bladder appears normal. Stomach/Bowel: The appendix appears normal. There is no evidence of bowel obstruction. Vascular/Lymphatic: Stable phlebolith is noted in right ovarian vein. No significant adenopathy is noted. Reproductive: Status post hysterectomy.  Ovaries are unremarkable. Other: Stable fat containing periumbilical hernia is noted. Musculoskeletal: Degenerative changes seen involving the lower lumbar spine. IMPRESSION: Stable fat containing periumbilical hernia. No acute abnormality seen in the abdomen or pelvis. Electronically Signed   By: Marijo Conception, M.D.   On: 12/06/2015 10:38    Procedures Procedures (including critical care time)  Medications Ordered in ED Medications  morphine 4 MG/ML injection 4 mg (4 mg Intravenous Given 12/06/15 0919)  iopamidol (ISOVUE-300) 61 % injection 30 mL (30 mLs Oral Contrast Given 12/06/15 0852)  iopamidol (ISOVUE-300) 61 % injection 100 mL (100 mLs Intravenous Contrast Given 12/06/15 1005)     Initial Impression / Assessment and Plan / ED Course  I have reviewed the triage vital signs and the nursing notes.  Pertinent labs & imaging results that were available during my care of the patient were reviewed by me and considered in my medical decision making (see chart for details).  Clinical Course      Final Clinical Impressions(s) / ED Diagnoses   Final diagnoses:  Abdominal pain, unspecified abdominal location   Labs:CBC, CMP, lipase,  urinalysis  Imaging:CT abdomen and pelvis  Consults:  Therapeutics:  Discharge Meds:   Assessment/Plan:   55 year old female presents today with initial complaint of abdominal pain. Patient reports this was 10 out of 10, she was diffusely tender upon my initial evaluation. She was more tender in the right lower quadrant. Patient is afebrile nontoxic in no acute distress. Basic labs and CT scan ordered. Patient's labs returned showing no significant abnormality, CT scan showed no significant abnormality. I discussed the patient's results with her, she reports that this is diverticulitis. She has no signs or symptoms that would be consistent with diverticulitis. Upon reevaluation patient had tenderness along the right lateral ribs and soft tissue, uncertain etiology of patient's pain today, likely musculoskeletal. Patient was encouraged to take Tylenol, she became very upset and told me that she needed real pain medication. Patient appeared to be in no acute distress, I informed her that that would not be appropriate in this setting, she again continued to be upset and requested to see a medical doctor. I informed Dr. Wilson Singer about patient's care and all relevant information, evaluated patient and agreed to my assessment and plan.   Looking through the [name] drug database patient has numerous prescription of Ultram most recently filled on 1020 749D tramadol, again on 1010 by another provider for another 90 tramadol. Patient is displaying drug-seeking behavior. No acute life-threatening etiology require further evaluation or management here in the ED setting. She discharged home with primary care follow-up.    New Prescriptions Discharge Medication List as of 12/06/2015 12:14 PM       Okey Regal, PA-C 12/06/15 1412    Virgel Manifold, MD 12/09/15 1057

## 2015-12-07 ENCOUNTER — Telehealth: Payer: Self-pay | Admitting: Adult Health

## 2015-12-07 MED ORDER — FAMOTIDINE 20 MG PO TABS: ORAL_TABLET | ORAL | 1 refills | Status: DC

## 2015-12-07 NOTE — Telephone Encounter (Signed)
Last ov 9.18.17 w/ TP Called spoke with patient, verified requested medication name/strength/instructions, pharmacy and quantity. Rx sent Nothing further needed; will sign off

## 2016-01-08 ENCOUNTER — Encounter: Payer: Self-pay | Admitting: Adult Health

## 2016-01-08 ENCOUNTER — Ambulatory Visit (INDEPENDENT_AMBULATORY_CARE_PROVIDER_SITE_OTHER): Payer: No Typology Code available for payment source | Admitting: Adult Health

## 2016-01-08 DIAGNOSIS — J45991 Cough variant asthma: Secondary | ICD-10-CM

## 2016-01-08 DIAGNOSIS — J328 Other chronic sinusitis: Secondary | ICD-10-CM | POA: Diagnosis not present

## 2016-01-08 MED ORDER — FLUTICASONE PROPIONATE 50 MCG/ACT NA SUSP: 2.0000 | Freq: Every day | NASAL | 1 refills | Status: DC

## 2016-01-08 NOTE — Assessment & Plan Note (Signed)
Compensated without flare  Patient's medications were reviewed today and patient education was given. Computerized medication calendar was adjusted/completed   Plan  . Patient Instructions  Continue on current regimen  Follow med calendar closely and bring to each visit .  Follow up with Dr. [name]  In 3-4 months and As needed

## 2016-01-08 NOTE — Progress Notes (Signed)
Subjective:    Patient ID: [name], female    DOB: [birth year], 55 y.o.   MRN: [id number]  HPI 55 yo female with chronic cough , ?asthma and OSA on CPAP   01/08/2016 Follow up : Asthma /UACS./OSA  Pt returns for 3 month follow up and med review  We reviewed all her meds and organized them into a med calendar with pt education   Says asthma is doing okay with no flare of wheezing  Does get winded intermittently especially with prolonged walking.   Chronic cough is doing okay , worse at night .  Tessalon helps her cough .  No fever or discolored mucus .   Does have stuffy nose and drainage on/off. Worse in mornings.  Would like refill of flonase as this helps. Ran out of this.       Past Medical History:  Diagnosis Date  . Acute on chronic diastolic CHF (congestive heart failure) ([place]) 04/11/2014  . Acute suppurative otitis media without spontaneous rupture of eardrum 02/04/2007   Centricity Description: OTITIS MEDIA, SUPPURATIVE, ACUTE, BILATERAL Qualifier: Diagnosis of  By: [name] MD, [name]  Centricity Description: OTITIS MEDIA, SUPPURATIVE, ACUTE Qualifier: Diagnosis of  By: [name] MD, [name]   . Allergic rhinitis   . CAD (coronary artery disease)    LHC (11/29/1999):  EF 60%; no significant CAD.  [name] Carpal tunnel syndrome   . Carpal tunnel syndrome 06/09/2006   Qualifier: Diagnosis of  By: Marca Ancona RMA, Lucy    . Chronic diastolic CHF (congestive heart failure) ([place])    a. Echo (01/21/11):  Vigorous LVF, EF 65-70%, no RWMA, Gr 2 DD. ;  b.  Echo (12/15): Moderate LVH, EF 123456, grade 2 diastolic dysfunction, mild LAE, normal RV function  . Common migraine   . Diastolic heart failure ([place])   . Diverticulitis   . Hepatic steatosis   . Hepatomegaly   . Hiatal hernia   . Hyperlipidemia   . Hypertension   . IBS (irritable bowel syndrome)   . Internal hemorrhoids   . Morbid obesity ([place])   . OSA (obstructive sleep apnea)    CPAP dependent  . PNEUMONIA, ORGAN  UNSPECIFIED 03/01/2009   Qualifier: Diagnosis of  By: [name] CMA, [name]    . Sinusitis, chronic 03/10/2012   CT sinuses 02/2012:  Chronic sinusitis with small A/F levels >> rx by ENT with prolonged augmentin F/u ct sinuses 04/2012:  Persistent sinus thickening >> rx with levaquin and clinda for 30 days.  Told to return for sinus scan in 6 weeks >> no showed for followup visit.  CT sinuses 11/2013:  Acute and chronic sinusitis noted.     Current Outpatient Prescriptions on File Prior to Visit  Medication Sig Dispense Refill  . albuterol (PROVENTIL) (2.5 MG/3ML) 0.083% nebulizer solution USE 3MLS (1 VIAL ) VIA NEBULIZER EVERY 6 HOURS FOR SHORTNESS OF BREATH OR WHEEZING 75 mL 3  . aspirin 81 MG EC tablet Take 1 tablet (81 mg total) by mouth daily. 90 tablet 3  . BIDIL 20-37.5 MG tablet Take 1 tablet by mouth 3 (three) times daily.    . budesonide-formoterol (SYMBICORT) 80-4.5 MCG/ACT inhaler Inhale 2 puffs into the lungs 2 (two) times daily as needed (For shortness of breath.).     [name] chlorpheniramine (CHLOR-TRIMETON) 4 MG tablet Take 4 mg by mouth at bedtime as needed for allergies.    [name] dicyclomine (BENTYL) 10 MG capsule TAKE 1 CAPSULE(10 MG) BY MOUTH FOUR TIMES DAILY BEFORE  MEALS AND AT BEDTIME 90 capsule 0  . famotidine (PEPCID) 20 MG tablet One at bedtime 90 tablet 1  . lactulose (CHRONULAC) 10 GM/15ML solution Take 15 mLs by mouth 2 (two) times daily as needed for constipation.    [name] linaclotide (LINZESS) 145 MCG CAPS capsule Take 145 mcg by mouth daily as needed (constipation).    [name] LITTLE NOSES SALINE NA Place 1 spray into the nose every other day.     . losartan (COZAAR) 100 MG tablet TAKE 1 TABLET (100 MG TOTAL) BY MOUTH DAILY. 90 tablet 1  . meclizine (ANTIVERT) 25 MG tablet Take 25 mg by mouth 3 (three) times daily as needed for dizziness.    . metolazone (ZAROXOLYN) 2.5 MG tablet TAKE 1 TABLET DAILY 30 MIN. BEFORE LASIX(TAKE ONLY IF WEIGHT INCREASE NOT IMPROVED WITH LASIX) 30 tablet 2  .  montelukast (SINGULAIR) 10 MG tablet Take 1 tablet (10 mg total) by mouth at bedtime. 90 tablet 2  . pantoprazole (PROTONIX) 40 MG tablet TAKE 1 TABLET BY MOUTH DAILY, TAKE 30 TO 60 MINUTES BEFORE FIRST MEAL OF THE DAY 90 tablet 3  . potassium chloride SA (K-DUR,KLOR-CON) 20 MEQ tablet TAKE 2 TABLET BY MOUTH THREE TIMES DAILY AND 1 TABLET EXTRA AS NEEDED (Patient taking differently: TAKE 2 TABLET BY MOUTH THREE TIMES DAILY AND 1 TABLET EXTRA AS NEEDED FOR POTASSIUM LOSS.) 217 tablet 2  . promethazine-codeine (PHENERGAN WITH CODEINE) 6.25-10 MG/5ML syrup Take 5 mLs by mouth every 6 (six) hours as needed for cough. 200 mL 0  . spironolactone (ALDACTONE) 25 MG tablet Take 0.5 tablets (12.5 mg total) by mouth 2 (two) times daily. 90 tablet 3  . Tetrahydroz-Dextran-PEG-Povid (EYE DROPS ADVANCED RELIEF OP) Place 1 drop into both eyes as needed (For eye irritation.).    [name] topiramate (TOPAMAX) 25 MG tablet Take 25 mg by mouth 2 (two) times daily as needed (for headaches).     . traMADol (ULTRAM) 50 MG tablet Take 1 tablet (50 mg total) by mouth every 6 (six) hours as needed for moderate pain. 40 tablet 0  . torsemide (DEMADEX) 20 MG tablet Take 2 tablets (40 mg total) by mouth 2 (two) times daily. 120 tablet 9   No current facility-administered medications on file prior to visit.      Review of Systems Constitutional:   No  weight loss, night sweats,  Fevers, chills, +fatigue, or  lassitude.  HEENT:   No headaches,  Difficulty swallowing,  Tooth/dental problems, or  Sore throat,                No sneezing, itching, ear ache, + nasal congestion, post nasal drip,   CV:  No chest pain,  Orthopnea, PND, swelling in lower extremities, anasarca, dizziness, palpitations, syncope.   GI  No heartburn, indigestion, abdominal pain, nausea, vomiting, diarrhea, change in bowel habits, loss of appetite, bloody stools.   Resp:    No chest wall deformity  Skin: no rash or lesions.  GU: no dysuria, change in  color of urine, no urgency or frequency.  No flank pain, no hematuria   MS:  No joint pain or swelling.  No decreased range of motion.  No back pain.  Psych:  No change in mood or affect. No depression or anxiety.  No memory loss.         Objective:   Physical Exam Vitals:   01/08/16 0927  BP: 114/66  Pulse: 76  SpO2: 96%  Weight: (!) 300 lb  3.2 oz (136.2 kg)  Height: 5\' 3"  (1.6 m)   GEN: A/Ox3; pleasant , NAD, obese    HEENT:  [place]/AT,  EACs-clear, TMs-wnl, NOSE-clear, THROAT-clear, no lesions, no postnasal drip or exudate noted. Class 2-3 MP airway   NECK:  Supple w/ fair ROM; no JVD; normal carotid impulses w/o bruits; no thyromegaly or nodules palpated; no lymphadenopathy.    RESP  Clear  P & A; w/o, wheezes/ rales/ or rhonchi. no accessory muscle use, no dullness to percussion  CARD:  RRR, no m/r/g  , tr peripheral edema, pulses intact, no cyanosis or clubbing.  GI:   Soft & nt; nml bowel sounds; no organomegaly or masses detected.   Musco: Warm bil, no deformities or joint swelling noted.   Neuro: alert, no focal deficits noted.    Skin: Warm, no lesions or rashes  Margie Brink NP-C  [facility]  01/08/2016       Assessment & Plan:

## 2016-01-08 NOTE — Patient Instructions (Signed)
Continue on current regimen .  Follow med calendar closely and bring to each visit.  Follow up with Dr. Wert  In 3-4 months and As needed   

## 2016-01-08 NOTE — Addendum Note (Signed)
Addended by: Parke Poisson E on: 01/08/2016 10:09 AM   Modules accepted: Orders

## 2016-01-08 NOTE — Progress Notes (Signed)
Chart and office note reviewed in detail  > agree with a/p as outlined    

## 2016-01-08 NOTE — Assessment & Plan Note (Signed)
CR - restart flonase  May use saline nasal rinses  Patient's medications were reviewed today and patient education was given. Computerized medication calendar was adjusted/completed   Plan  Patient Instructions  Continue on current regimen  Follow med calendar closely and bring to each visit .  Follow up with Dr. [name]  In 3-4 months and As needed

## 2016-01-17 ENCOUNTER — Other Ambulatory Visit: Payer: Self-pay | Admitting: Cardiology

## 2016-01-24 ENCOUNTER — Telehealth: Payer: Self-pay | Admitting: Internal Medicine

## 2016-01-24 DIAGNOSIS — G4733 Obstructive sleep apnea (adult) (pediatric): Secondary | ICD-10-CM

## 2016-01-24 NOTE — Telephone Encounter (Signed)
PM  Please Advise-  Pt. Is aware that you would not be in the office the rest of the week.  Spoke with pt and informed her that we have not seen her in 6 months and around this time you wanted her to return to follow up on her BiPAP, pt. Currently does not have any upcoming appointments to follow up on her sleep, I informed the pt. That it would be good to for her to get a follow up appointment but she wanted to see first if an order for her to receive a new machine can be placed first.

## 2016-01-29 NOTE — Telephone Encounter (Signed)
PM please advise. Thanks  

## 2016-01-30 NOTE — Telephone Encounter (Signed)
Ok to keep appt with MW as scheduled. Make sure we can get the machine download on day of visit.

## 2016-01-30 NOTE — Telephone Encounter (Signed)
When do you want her to follow up?  She has a scheduled appt with MW (Pulm f/u) on 04/08/16.   Order placed for BiPAP machine.  Spoke with [employer] to get current settings of BiPAP from most recent Auto Titration -- 20/18, PS 3  Please advise Dr Vaughan Browner. Thanks.

## 2016-01-30 NOTE — Telephone Encounter (Signed)
Ok to place order for Bipap first. Please make sure she has follow up in clinic as well. Thanks

## 2016-01-30 NOTE — Telephone Encounter (Signed)
lmtcb for pt.  

## 2016-01-31 NOTE — Telephone Encounter (Signed)
Pt returning call and says please leave msg told her that we tried to leave msg before and there was no option, she says yes it was, anyway please call her back.[name]

## 2016-01-31 NOTE — Telephone Encounter (Signed)
Spoke with the pt and notified her PM says to keep scheduled ov with MW  Nothing further needed

## 2016-01-31 NOTE — Telephone Encounter (Signed)
Attempted to contact pt. No answer, no option to leave a message. Will try back.  

## 2016-02-05 ENCOUNTER — Telehealth: Payer: Self-pay | Admitting: Pulmonary Disease

## 2016-02-05 NOTE — Telephone Encounter (Signed)
Pt states she spoke with [facility], who states the order that was placed from our office on 01-30-16, was to repair pt's machine and not replace it. I explained to pt exactly what we had sent to [facility]. Pt states she then received another call from [facility], who states that they had received our order for a new cpap machine. Nothing further needed.

## 2016-02-06 ENCOUNTER — Telehealth: Payer: Self-pay | Admitting: Internal Medicine

## 2016-02-06 NOTE — Telephone Encounter (Signed)
Can't treat a cough like this over the phone and using stronger abx risks C diff so needs ov first with all meds in hand and go from there/ only other option is go to UC or back to PCP  In future for flare of resp symptoms of any source she should be or one of the NP's so we don't get caught in same situation

## 2016-02-06 NOTE — Telephone Encounter (Signed)
Spoke with pt, who states she has had a prod cough with yellow mucus since christmas. Pt seen her PCP on 01/23/16 and was dx with bronchitis. Pt was given zpak with no improvement. Pt is requesting an stronger abx to be sent in for this lingering cough. Pt denies any other symptoms  Pt taking mucinex & flonase bid with no improvement.  MW please advise. Thanks.    Instructions      Return in about 3 months (around 01/08/2016) for Follow up with Dr. [name].  Augmentin 875mg  Twice daily  For 7 days ,take with food Mucinex DM Twice daily  As needed  Cough/congestion  Phenergan w/ codeine 1 tsp every 6hr As needed  Cough , may make you sleepy Saline nasal rinses As needed   follow up Dr. [name]  In 3 months and As needed   Please contact office for sooner follow up if symptoms do not improve or worsen or seek emergency care

## 2016-02-06 NOTE — Telephone Encounter (Signed)
No that's fine

## 2016-02-06 NOTE — Telephone Encounter (Signed)
Pt aware of office visit.  Will close encounter.

## 2016-02-06 NOTE — Addendum Note (Signed)
Addended by: Doroteo Glassman D on: 02/06/2016 11:08 AM   Modules accepted: Orders

## 2016-02-06 NOTE — Telephone Encounter (Signed)
Spoke with pt and made her aware of MW's recommendations.  Pt was offered an appointment for Thursday with SG @ 11:30. Pt states she needed an appointment after 3:30 due to not having a ride. Pt has been scheduled with TP on 02-13-16 @ 4:30. MW please advise if this is okay, or if you would like to fit pt in your schedule this week. Thanks.

## 2016-02-13 ENCOUNTER — Ambulatory Visit: Payer: No Typology Code available for payment source | Admitting: Adult Health

## 2016-02-25 NOTE — Progress Notes (Signed)
HPI  Pt presents for follow up of follow-up of acute on chronic diastolic HF.   Since I last saw her she has done well.  She has intermittent SOB but no acute decompensated symptoms.  The patient denies any new symptoms such as  neck or arm discomfort. There has been no new shortness of breath, PND or orthopnea. There have been no reported palpitations, presyncope or syncope.  Allergies  Allergen Reactions  . Sulfonamide Derivatives Anaphylaxis, Swelling, Rash and Other (See Comments)    Swelling tongue and ear   . Doxycycline Nausea And Vomiting  . Latex Hives  . Ciprofloxacin Rash  . Fish Allergy Rash and Other (See Comments)    Only reaction to Mackerel.  No other issues with any type of fish or shellfish currently   . Hydrocortisone Rash  . Neomycin Rash    Current Outpatient Prescriptions  Medication Sig Dispense Refill  . Ascorbic Acid (VITAMIN C) 100 MG tablet Take 100 mg by mouth daily.    [name] aspirin 81 MG EC tablet Take 1 tablet (81 mg total) by mouth daily. 90 tablet 3  . benzonatate (TESSALON) 100 MG capsule Take by mouth 3 (three) times daily as needed for cough.    [name] BIDIL 20-37.5 MG tablet Take 1 tablet by mouth 3 (three) times daily.    . budesonide-formoterol (SYMBICORT) 80-4.5 MCG/ACT inhaler Inhale 2 puffs into the lungs 2 (two) times daily as needed (For shortness of breath.).     [name] chlorpheniramine (CHLOR-TRIMETON) 4 MG tablet Take 4 mg by mouth every 4 (four) hours as needed for allergies.     [name] dicyclomine (BENTYL) 10 MG capsule TAKE 1 CAPSULE(10 MG) BY MOUTH FOUR TIMES DAILY BEFORE MEALS AND AT BEDTIME 90 capsule 0  . famotidine (PEPCID) 20 MG tablet One at bedtime 90 tablet 1  . fluticasone (FLONASE) 50 MCG/ACT nasal spray Place 2 sprays into both nostrils daily. 48 g 1  . lactulose (CHRONULAC) 10 GM/15ML solution Take 15 mLs by mouth 2 (two) times daily as needed for constipation.    [name] linaclotide (LINZESS) 145 MCG CAPS capsule Take 145 mcg by mouth daily as  needed (constipation).    [name] losartan (COZAAR) 100 MG tablet TAKE 1 TABLET (100 MG TOTAL) BY MOUTH DAILY. 90 tablet 1  . meclizine (ANTIVERT) 25 MG tablet Take 25 mg by mouth 3 (three) times daily as needed for dizziness.    . metolazone (ZAROXOLYN) 2.5 MG tablet TAKE 1 TABLET DAILY 30 MIN. BEFORE LASIX(TAKE ONLY IF WEIGHT INCREASE NOT IMPROVED WITH LASIX) 90 tablet 1  . montelukast (SINGULAIR) 10 MG tablet Take 1 tablet (10 mg total) by mouth at bedtime. 90 tablet 2  . pantoprazole (PROTONIX) 40 MG tablet TAKE 1 TABLET BY MOUTH DAILY, TAKE 30 TO 60 MINUTES BEFORE FIRST MEAL OF THE DAY 90 tablet 3  . potassium chloride SA (K-DUR,KLOR-CON) 20 MEQ tablet Take 60 mEq by mouth 3 (three) times daily.    . pramoxine-hydrocortisone (EPIFOAM) 1-1 % foam Use as directed    . spironolactone (ALDACTONE) 25 MG tablet Take 0.5 tablets (12.5 mg total) by mouth 2 (two) times daily. 90 tablet 3  . topiramate (TOPAMAX) 25 MG tablet Take 25 mg by mouth 2 (two) times daily as needed (for headaches).     . torsemide (DEMADEX) 20 MG tablet Take 2 tablets (40 mg total) by mouth 2 (two) times daily. 120 tablet 9  . traMADol (ULTRAM) 50 MG tablet Take 1 tablet (  50 mg total) by mouth every 6 (six) hours as needed for moderate pain. 40 tablet 0   No current facility-administered medications for this visit.     Past Medical History:  Diagnosis Date  . Acute on chronic diastolic CHF (congestive heart failure) ([place]) 04/11/2014  . Acute suppurative otitis media without spontaneous rupture of eardrum 02/04/2007   Centricity Description: OTITIS MEDIA, SUPPURATIVE, ACUTE, BILATERAL Qualifier: Diagnosis of  By: [name] MD, [name]  Centricity Description: OTITIS MEDIA, SUPPURATIVE, ACUTE Qualifier: Diagnosis of  By: [name] MD, [name]   . Allergic rhinitis   . CAD (coronary artery disease)    LHC (11/29/1999):  EF 60%; no significant CAD.  [name] Carpal tunnel syndrome   . Carpal tunnel syndrome 06/09/2006   Qualifier: Diagnosis of   By: Marca Ancona RMA, Lucy    . Chronic diastolic CHF (congestive heart failure) ([place])    a. Echo (01/21/11):  Vigorous LVF, EF 65-70%, no RWMA, Gr 2 DD. ;  b.  Echo (12/15): Moderate LVH, EF 123456, grade 2 diastolic dysfunction, mild LAE, normal RV function  . Common migraine   . Diastolic heart failure ([place])   . Diverticulitis   . Hepatic steatosis   . Hepatomegaly   . Hiatal hernia   . Hyperlipidemia   . Hypertension   . IBS (irritable bowel syndrome)   . Internal hemorrhoids   . Morbid obesity ([place])   . OSA (obstructive sleep apnea)    CPAP dependent  . PNEUMONIA, ORGAN UNSPECIFIED 03/01/2009   Qualifier: Diagnosis of  By: [name] CMA, [name]    . Sinusitis, chronic 03/10/2012   CT sinuses 02/2012:  Chronic sinusitis with small A/F levels >> rx by ENT with prolonged augmentin F/u ct sinuses 04/2012:  Persistent sinus thickening >> rx with levaquin and clinda for 30 days.  Told to return for sinus scan in 6 weeks >> no showed for followup visit.  CT sinuses 11/2013:  Acute and chronic sinusitis noted.      Past Surgical History:  Procedure Laterality Date  . CESAREAN SECTION    . NASAL SINUS SURGERY    . PARTIAL HYSTERECTOMY    . TONSILLECTOMY    . TUBAL LIGATION    . UVULOPALATOPHARYNGOPLASTY      ROS:    As stated in the HPI and negative for all other systems.  PHYSICAL EXAM BP 122/64   Pulse 88   Ht 5\' 3"  (1.6 m)   Wt (!) 303 lb (137.4 kg)   BMI 53.67 kg/m  GENERAL:  Well appearing NECK:  No jugular venous distention, waveform within normal limits, carotid upstroke brisk and symmetric, no bruits, no thyromegaly LUNGS:  Clear to auscultation bilaterally CHEST:  Unremarkable HEART:  PMI not displaced or sustained,S1 and S2 within normal limits, no S3, no S4, no clicks, no rubs, no murmurs ABD:  Obese, positive bowel sounds normal in frequency in pitch, no bruits, no rebound, no guarding, no midline pulsatile mass, no hepatomegaly, no splenomegaly EXT:  2 plus pulses  throughout, no edema, no cyanosis no clubbing    ASSESSMENT AND PLAN   Chronic diastolic heart failure She seems to be euvolemic.  She has no decompensated symptoms.  She understands salt and fluid management.  She had a potassium that was recently checked by renal and it was 3.4.  She is going to increase the potassium containing foods in her diet.  She has this followed by renal and Bartholome Bill, MD  Essential hypertension  The  blood pressure is at target. No change in medications is indicated. We will continue with therapeutic lifestyle changes (TLC).  Obstructive sleep apnea I instructed her to follow-up with pulmonary who has been managing this.  She did complain of phlegm when she wears her CPAP.    Morbid Obesity She understands the need to lose weight with diet and exercise.

## 2016-02-26 ENCOUNTER — Encounter: Payer: Self-pay | Admitting: Cardiology

## 2016-02-26 ENCOUNTER — Other Ambulatory Visit: Payer: Self-pay | Admitting: Cardiology

## 2016-02-26 ENCOUNTER — Ambulatory Visit (INDEPENDENT_AMBULATORY_CARE_PROVIDER_SITE_OTHER): Payer: Managed Care, Other (non HMO) | Admitting: Cardiology

## 2016-02-26 VITALS — BP 122/64 | HR 88 | Ht 63.0 in | Wt 303.0 lb

## 2016-02-26 DIAGNOSIS — R0602 Shortness of breath: Secondary | ICD-10-CM

## 2016-02-26 DIAGNOSIS — J441 Chronic obstructive pulmonary disease with (acute) exacerbation: Secondary | ICD-10-CM

## 2016-02-26 NOTE — Patient Instructions (Addendum)
Medication Instructions:  Continue current medications  Labwork: None Ordered  Testing/Procedures: None Ordered  Follow-Up:  You have been referred to Dr Vaughan Browner Pulmonologist  Your physician recommends that you schedule a follow-up appointment in: 3 Months with Suanne Marker   Any Other Special Instructions Will Be Listed Below (If Applicable).   If you need a refill on your cardiac medications before your next appointment, please call your pharmacy.

## 2016-02-27 NOTE — Telephone Encounter (Signed)
Furosemide changed to torsemide 08/2015

## 2016-03-05 ENCOUNTER — Other Ambulatory Visit: Payer: Self-pay | Admitting: Cardiology

## 2016-03-05 NOTE — Telephone Encounter (Signed)
REFILL 

## 2016-04-01 ENCOUNTER — Encounter: Payer: Self-pay | Admitting: Internal Medicine

## 2016-04-02 ENCOUNTER — Telehealth: Payer: Self-pay | Admitting: Adult Health

## 2016-04-02 DIAGNOSIS — G4733 Obstructive sleep apnea (adult) (pediatric): Secondary | ICD-10-CM

## 2016-04-02 NOTE — Telephone Encounter (Signed)
Spoke with Jonni Sanger at [facility], states that pt called [facility] stating that she is not tolerating her new bipap pressure well.  Pt just received a new bipap with pressure of 18-20, pressure support of 3.  Since getting new machine pt has had to take mask off frequently through the night d/t "getting too much air in".  Pt is requesting that we lower pressure settings.    Pt's last bipap pressure settings were set at Auto 14-20, pressure support of 4.   Download printed off of [employer].    MW please advise on recs.  Thanks!   Last avs from 10/09/15 ov with TP-    Return in about 3 months (around 01/08/2016) for Follow up with Dr. [name].  Augmentin 875mg  Twice daily  For 7 days ,take with food Mucinex DM Twice daily  As needed  Cough/congestion  Phenergan w/ codeine 1 tsp every 6hr As needed  Cough , may make you sleepy Saline nasal rinses As needed   follow up Dr. [name]  In 3 months and As needed   Please contact office for sooner follow up if symptoms do not improve or worsen or seek emergency care

## 2016-04-02 NOTE — Telephone Encounter (Signed)
Download 04/01/16  60% use on  Vauto  20 I/ 18 e and PS 1 with AHI  1.6 > rec refer  to sleep medicine as Dr Gwenette Greet is no longer with practice   In meantime ok to change back to previous settings

## 2016-04-02 NOTE — Telephone Encounter (Signed)
Order placed for new bipap settings. lmtcb X1 to schedule office visit with a sleep doctor.  Wcb.

## 2016-04-03 NOTE — Telephone Encounter (Signed)
Called and spoke to pt. Pt already has a sleep consult appt with Dr. [name], pt aware. Nothing further needed at this time.

## 2016-04-08 ENCOUNTER — Ambulatory Visit: Payer: No Typology Code available for payment source | Admitting: Internal Medicine

## 2016-04-10 ENCOUNTER — Ambulatory Visit: Payer: Managed Care, Other (non HMO) | Admitting: Pulmonary Disease

## 2016-04-11 ENCOUNTER — Ambulatory Visit: Payer: Managed Care, Other (non HMO) | Admitting: Pulmonary Disease

## 2016-04-12 ENCOUNTER — Encounter (HOSPITAL_COMMUNITY): Payer: Self-pay | Admitting: *Deleted

## 2016-04-12 ENCOUNTER — Emergency Department (HOSPITAL_COMMUNITY)
Admission: EM | Admit: 2016-04-12 | Discharge: 2016-04-13 | Disposition: A | Discharge status: Home / Self Care | Payer: Managed Care, Other (non HMO) | Attending: Emergency Medicine | Admitting: Emergency Medicine

## 2016-04-12 ENCOUNTER — Emergency Department (HOSPITAL_COMMUNITY): Payer: Managed Care, Other (non HMO)

## 2016-04-12 DIAGNOSIS — Z79899 Other long term (current) drug therapy: Secondary | ICD-10-CM | POA: Diagnosis not present

## 2016-04-12 DIAGNOSIS — Z91040 Latex allergy status: Secondary | ICD-10-CM | POA: Diagnosis not present

## 2016-04-12 DIAGNOSIS — I2510 Atherosclerotic heart disease of native coronary artery without angina pectoris: Secondary | ICD-10-CM | POA: Insufficient documentation

## 2016-04-12 DIAGNOSIS — I5033 Acute on chronic diastolic (congestive) heart failure: Secondary | ICD-10-CM | POA: Diagnosis not present

## 2016-04-12 DIAGNOSIS — J449 Chronic obstructive pulmonary disease, unspecified: Secondary | ICD-10-CM | POA: Insufficient documentation

## 2016-04-12 DIAGNOSIS — Z7982 Long term (current) use of aspirin: Secondary | ICD-10-CM | POA: Insufficient documentation

## 2016-04-12 DIAGNOSIS — K219 Gastro-esophageal reflux disease without esophagitis: Secondary | ICD-10-CM | POA: Diagnosis not present

## 2016-04-12 DIAGNOSIS — I110 Hypertensive heart disease with heart failure: Secondary | ICD-10-CM | POA: Diagnosis not present

## 2016-04-12 DIAGNOSIS — R1084 Generalized abdominal pain: Secondary | ICD-10-CM | POA: Diagnosis present

## 2016-04-12 DIAGNOSIS — E876 Hypokalemia: Secondary | ICD-10-CM

## 2016-04-12 LAB — CBC
HEMATOCRIT: 35.4 % — AB (ref 36.0–46.0)
HEMOGLOBIN: 11.4 g/dL — AB (ref 12.0–15.0)
MCH: 29.3 pg (ref 26.0–34.0)
MCHC: 32.2 g/dL (ref 30.0–36.0)
MCV: 91 fL (ref 78.0–100.0)
Platelets: 327 10*3/uL (ref 150–400)
RBC: 3.89 MIL/uL (ref 3.87–5.11)
RDW: 14.3 % (ref 11.5–15.5)
WBC: 11.3 10*3/uL — ABNORMAL HIGH (ref 4.0–10.5)

## 2016-04-12 LAB — TROPONIN I

## 2016-04-12 LAB — COMPREHENSIVE METABOLIC PANEL
ALK PHOS: 83 U/L (ref 38–126)
ALT: 20 U/L (ref 14–54)
ANION GAP: 13 (ref 5–15)
AST: 30 U/L (ref 15–41)
Albumin: 3.7 g/dL (ref 3.5–5.0)
BUN: 34 mg/dL — ABNORMAL HIGH (ref 6–20)
CALCIUM: 9.1 mg/dL (ref 8.9–10.3)
CO2: 29 mmol/L (ref 22–32)
CREATININE: 1.12 mg/dL — AB (ref 0.44–1.00)
Chloride: 90 mmol/L — ABNORMAL LOW (ref 101–111)
GFR calc non Af Amer: 53 mL/min — ABNORMAL LOW (ref >60)
Glucose, Bld: 141 mg/dL — ABNORMAL HIGH (ref 65–99)
Potassium: 2.6 mmol/L — CL (ref 3.5–5.1)
Sodium: 132 mmol/L — ABNORMAL LOW (ref 135–145)
TOTAL PROTEIN: 8.4 g/dL — AB (ref 6.5–8.1)
Total Bilirubin: 0.8 mg/dL (ref 0.3–1.2)

## 2016-04-12 LAB — URINALYSIS, ROUTINE W REFLEX MICROSCOPIC
Bilirubin Urine: NEGATIVE
GLUCOSE, UA: NEGATIVE mg/dL
Hgb urine dipstick: NEGATIVE
Ketones, ur: NEGATIVE mg/dL
LEUKOCYTES UA: NEGATIVE
NITRITE: NEGATIVE
PH: 6 (ref 5.0–8.0)
Protein, ur: NEGATIVE mg/dL
Specific Gravity, Urine: 1.008 (ref 1.005–1.030)

## 2016-04-12 LAB — LIPASE, BLOOD: Lipase: 26 U/L (ref 11–51)

## 2016-04-12 LAB — MAGNESIUM: Magnesium: 1.9 mg/dL (ref 1.7–2.4)

## 2016-04-12 LAB — BRAIN NATRIURETIC PEPTIDE: B Natriuretic Peptide: 14.5 pg/mL (ref 0.0–100.0)

## 2016-04-12 MED ORDER — SPIRONOLACTONE 12.5 MG HALF TABLET
12.5000 mg | ORAL_TABLET | Freq: Once | ORAL | Status: AC
  Administered 2016-04-12: 12.5 mg via ORAL
  Filled 2016-04-12: qty 1

## 2016-04-12 MED ORDER — SODIUM CHLORIDE 0.9 % IV SOLN
30.0000 meq | Freq: Once | INTRAVENOUS | Status: AC
  Administered 2016-04-12: 30 meq via INTRAVENOUS
  Filled 2016-04-12: qty 15

## 2016-04-12 MED ORDER — POTASSIUM CHLORIDE CRYS ER 20 MEQ PO TBCR
80.0000 meq | EXTENDED_RELEASE_TABLET | Freq: Once | ORAL | Status: AC
  Administered 2016-04-12: 80 meq via ORAL
  Filled 2016-04-12: qty 4

## 2016-04-12 MED ORDER — TORSEMIDE 20 MG PO TABS
40.0000 mg | ORAL_TABLET | Freq: Every day | ORAL | Status: DC
Start: 2016-04-13 — End: 2016-04-13

## 2016-04-12 MED ORDER — GI COCKTAIL ~~LOC~~
30.0000 mL | Freq: Once | ORAL | Status: AC
  Administered 2016-04-12: 30 mL via ORAL
  Filled 2016-04-12: qty 30

## 2016-04-12 MED ORDER — SODIUM CHLORIDE 0.9 % IV SOLN: 30.0000 meq | Freq: Once | INTRAVENOUS | Status: DC

## 2016-04-12 MED ORDER — ISOSORB DINITRATE-HYDRALAZINE 20-37.5 MG PO TABS
1.0000 | ORAL_TABLET | Freq: Once | ORAL | Status: AC
  Administered 2016-04-12: 1 via ORAL
  Filled 2016-04-12: qty 1

## 2016-04-12 NOTE — ED Notes (Signed)
Delay in blood draw due to IVK  not completed.

## 2016-04-12 NOTE — ED Notes (Signed)
Troponin redrawn and sent to lab

## 2016-04-12 NOTE — ED Provider Notes (Signed)
[facility] DEPT Provider Note   CSN: [id number] Arrival date & time: 04/12/16  1357     History   Chief Complaint Chief Complaint  Patient presents with  . Shortness of Breath  . Abdominal Pain    HPI [name] is a 56 y.o. female.  HPI  56 year old female who presents with abdominal pain and dyspnea. History of chronic diastolic Heart failure, COPD, hypertension, hyperlipidemia, IBS and morbid obesity. Has a history of partial hysterectomy, cesarean section and tubal ligation. States that 3 days ago was feeling constipated, but it was relieved after taking MiraLAX. Afterwards, she began to feel intermittent abdominal cramping which feels like spasms consistent with that of her IBS. Pain comes and goes. Not associated with eating. States that she took Bentyl which did not improve her symptoms. Also describes burning abdominal pain diffusely not improved by her home famotidine. States that whenever she would eat or drink something she would start gagging, and feel short of breath like food was clogging her airway. Sleeps with CPAP, but also states that she did wake up feeling short of breath overnight. He feels abdominal distention but denies any lower extremity edema. No chest pain, fevers, coughing. Had a normal bowel movement today. No dysuria or urinary frequency.   Past Medical History:  Diagnosis Date  . Acute on chronic diastolic CHF (congestive heart failure) ([place]) 04/11/2014  . Acute suppurative otitis media without spontaneous rupture of eardrum 02/04/2007   Centricity Description: OTITIS MEDIA, SUPPURATIVE, ACUTE, BILATERAL Qualifier: Diagnosis of  By: [name] MD, [name]  Centricity Description: OTITIS MEDIA, SUPPURATIVE, ACUTE Qualifier: Diagnosis of  By: [name] MD, [name]   . Allergic rhinitis   . CAD (coronary artery disease)    LHC (11/29/1999):  EF 60%; no significant CAD.  [name] Carpal tunnel syndrome   . Carpal tunnel syndrome 06/09/2006   Qualifier: Diagnosis of   By: Marca Ancona RMA, Lucy    . Chronic diastolic CHF (congestive heart failure) ([place])    a. Echo (01/21/11):  Vigorous LVF, EF 65-70%, no RWMA, Gr 2 DD. ;  b.  Echo (12/15): Moderate LVH, EF 75-91%, grade 2 diastolic dysfunction, mild LAE, normal RV function  . Common migraine   . Diastolic heart failure ([place])   . Diverticulitis   . Hepatic steatosis   . Hepatomegaly   . Hiatal hernia   . Hyperlipidemia   . Hypertension   . IBS (irritable bowel syndrome)   . Internal hemorrhoids   . Morbid obesity ([place])   . OSA (obstructive sleep apnea)    CPAP dependent  . PNEUMONIA, ORGAN UNSPECIFIED 03/01/2009   Qualifier: Diagnosis of  By: [name] CMA, [name]    . Sinusitis, chronic 03/10/2012   CT sinuses 02/2012:  Chronic sinusitis with small A/F levels >> rx by ENT with prolonged augmentin F/u ct sinuses 04/2012:  Persistent sinus thickening >> rx with levaquin and clinda for 30 days.  Told to return for sinus scan in 6 weeks >> no showed for followup visit.  CT sinuses 11/2013:  Acute and chronic sinusitis noted.      Patient Active Problem List   Diagnosis Date Noted  . Cough variant asthma 07/31/2015  . Upper airway cough syndrome 04/11/2015  . Severe obesity (BMI >= 40) ([place]) 04/11/2015  . Bronchitis, chronic obstructive w acute bronchitis ([place]) 04/11/2015  . Dyspnea   . Acute on chronic diastolic CHF (congestive heart failure), NYHA class 2 ([place]) 03/27/2015  . Morbid obesity due to  excess calories ([place]) 03/27/2015  . Dysphagia 03/27/2015  . Hyponatremia 03/27/2015  . Acute kidney injury ([place]) 03/27/2015  . Hypokalemia 03/27/2015  . Leukocytosis 03/27/2015  . COPD exacerbation ([place]) 03/27/2015  . Sinusitis, chronic 03/10/2012  . Hyperlipidemia 10/02/2006  . Obstructive sleep apnea 06/09/2006  . Essential hypertension 06/09/2006    Past Surgical History:  Procedure Laterality Date  . CESAREAN SECTION    . NASAL SINUS SURGERY    . PARTIAL HYSTERECTOMY    . TONSILLECTOMY    . TUBAL  LIGATION    . UVULOPALATOPHARYNGOPLASTY      OB History    No data available       Home Medications    Prior to Admission medications   Medication Sig Start Date End Date Taking? Authorizing Provider  Ascorbic Acid (VITAMIN C) 100 MG tablet Take 100 mg by mouth daily.   Yes Historical Provider, MD  aspirin 81 MG EC tablet Take 1 tablet (81 mg total) by mouth daily. 07/03/15  Yes Almyra Deforest, PA  benzonatate (TESSALON) 100 MG capsule Take by mouth 3 (three) times daily as needed for cough.   Yes Historical Provider, MD  BIDIL 20-37.5 MG tablet Take 1 tablet by mouth 3 (three) times daily. 09/11/15  Yes Historical Provider, MD  budesonide-formoterol (SYMBICORT) 80-4.5 MCG/ACT inhaler Inhale 2 puffs into the lungs 2 (two) times daily as needed (For shortness of breath.).    Yes Historical Provider, MD  chlorpheniramine (CHLOR-TRIMETON) 4 MG tablet Take 4 mg by mouth every 4 (four) hours as needed for allergies.    Yes Historical Provider, MD  dicyclomine (BENTYL) 10 MG capsule TAKE 1 CAPSULE(10 MG) BY MOUTH FOUR TIMES DAILY BEFORE MEALS AND AT BEDTIME 12/04/15  Yes Irene Shipper, MD  famotidine (PEPCID) 20 MG tablet One at bedtime 12/07/15  Yes Tanda Rockers, MD  fluticasone [facility] 50 MCG/ACT nasal spray Place 2 sprays into both nostrils daily. 01/08/16  Yes [name] S Parrett, NP  lactulose (CHRONULAC) 10 GM/15ML solution Take 15 mLs by mouth 2 (two) times daily as needed for constipation. 12/04/15  Yes Historical Provider, MD  linaclotide (LINZESS) 145 MCG CAPS capsule Take 145 mcg by mouth daily as needed (constipation).   Yes Historical Provider, MD  losartan (COZAAR) 100 MG tablet TAKE 1 TABLET (100 MG TOTAL) BY MOUTH DAILY. 03/05/16  Yes Minus Breeding, MD  meclizine (ANTIVERT) 25 MG tablet Take 25 mg by mouth 3 (three) times daily as needed for dizziness.   Yes Historical Provider, MD  metolazone (ZAROXOLYN) 2.5 MG tablet TAKE 1 TABLET DAILY 30 MIN. BEFORE LASIX(TAKE ONLY IF WEIGHT INCREASE  NOT IMPROVED WITH LASIX) Patient taking differently: TAKE 1 TABLET DAILY AS NEEDED FOR WEIGHT GAIN OR LEG SWELLING. 01/17/16  Yes Minus Breeding, MD  montelukast (SINGULAIR) 10 MG tablet Take 1 tablet (10 mg total) by mouth at bedtime. 10/14/15  Yes Juanito Doom, MD  pantoprazole (PROTONIX) 40 MG tablet TAKE 1 TABLET BY MOUTH DAILY, TAKE 30 TO 60 MINUTES BEFORE FIRST MEAL OF THE DAY 11/07/15  Yes Tanda Rockers, MD  potassium chloride SA (K-DUR,KLOR-CON) 20 MEQ tablet Take 60 mEq by mouth 3 (three) times daily.   Yes Historical Provider, MD  pramoxine-hydrocortisone (EPIFOAM) 1-1 % foam Apply 1 applicator topically 3 (three) times daily as needed (hemorrhoids). Use as directed    Yes Historical Provider, MD  spironolactone (ALDACTONE) 25 MG tablet Take 0.5 tablets (12.5 mg total) by mouth 2 (two) times daily. 10/10/15  Yes  Minus Breeding, MD  topiramate (TOPAMAX) 25 MG tablet Take 25 mg by mouth 2 (two) times daily as needed (for headaches).  03/03/15  Yes Historical Provider, MD  torsemide (DEMADEX) 20 MG tablet Take 2 tablets (40 mg total) by mouth 2 (two) times daily. 08/25/15 04/12/16 Yes Minus Breeding, MD  traMADol (ULTRAM) 50 MG tablet Take 1 tablet (50 mg total) by mouth every 6 (six) hours as needed for moderate pain. 07/31/15  Yes Tanda Rockers, MD    Family History Family History  Problem Relation Age of Onset  . Heart disease Mother   . Heart attack Mother   . Hypertension Mother   . Coronary artery disease      1st degree relatvie <50  . Breast cancer      aunt- ? paternal or maternal  . Asthma Sister   . Stroke Neg Hx     Social History Social History  Substance Use Topics  . Smoking status: Never Smoker  . Smokeless tobacco: Never Used  . Alcohol use No     Allergies   Sulfonamide derivatives; Doxycycline; Latex; Ciprofloxacin; Fish allergy; Hydrocortisone; and Neomycin   Review of Systems Review of Systems 10/14 systems reviewed and are negative other than  those stated in the HPI   Physical Exam Updated Vital Signs BP (!) 124/56 (BP Location: Right Arm)   Pulse 100   Temp 98.1 F (36.7 C) (Oral)   Resp 20   Ht 5\' 3"  (1.6 m)   Wt 290 lb (131.5 kg)   SpO2 98%   BMI 51.37 kg/m   Physical Exam Physical Exam  Nursing note and vitals reviewed. Constitutional: chronically ill appearing, well nourished, non-toxic, and in no acute distress Head: Normocephalic and atraumatic.  Mouth/Throat: Oropharynx is clear and moist.  Neck: Normal range of motion. Neck supple.  Cardiovascular: Normal rate and regular rhythm.   Pulmonary/Chest: Effort normal and breath sounds normal.  Abdominal: Soft. Morbidly obese. There is no significant tenderness to palpation, although reports burning pain diffusely. There is no rebound and no guarding.  Musculoskeletal: Normal range of motion. No LE edema Neurological: Alert, no facial droop, fluent speech, moves all extremities symmetrically Skin: Skin is warm and dry.  Psychiatric: Cooperative   ED Treatments / Results  Labs (all labs ordered are listed, but only abnormal results are displayed) Labs Reviewed  COMPREHENSIVE METABOLIC PANEL - Abnormal; Notable for the following:       Result Value   Sodium 132 (*)    Potassium 2.6 (*)    Chloride 90 (*)    Glucose, Bld 141 (*)    BUN 34 (*)    Creatinine, Ser 1.12 (*)    Total Protein 8.4 (*)    GFR calc non Af Amer 53 (*)    All other components within normal limits  CBC - Abnormal; Notable for the following:    WBC 11.3 (*)    Hemoglobin 11.4 (*)    HCT 35.4 (*)    All other components within normal limits  URINALYSIS, ROUTINE W REFLEX MICROSCOPIC - Abnormal; Notable for the following:    Color, Urine STRAW (*)    All other components within normal limits  POTASSIUM - Abnormal; Notable for the following:    Potassium 3.2 (*)    All other components within normal limits  LIPASE, BLOOD  BRAIN NATRIURETIC PEPTIDE  MAGNESIUM  TROPONIN I     EKG  EKG Interpretation  Date/Time:  Friday April 12 2016 18:14:25  EDT Ventricular Rate:  97 PR Interval:    QRS Duration: 94 QT Interval:  370 QTC Calculation: 470 R Axis:   127 Text Interpretation:  Sinus rhythm Low voltage with right axis deviation Probable anteroseptal infarct, old Confirmed by Yeira Gulden MD, Luby Seamans [phone]) on 04/12/2016 11:56:40 PM       Radiology Dg Chest 2 View  Result Date: 04/12/2016 CLINICAL DATA:  Chronic shortness of Breath EXAM: CHEST  2 VIEW COMPARISON:  10/29/2015 FINDINGS: Cardiac shadow is mildly enlarged but stable. The lungs are well aerated bilaterally. No focal infiltrate or sizable effusion is seen. Degenerative changes of thoracic spine are noted. IMPRESSION: No active cardiopulmonary disease. Electronically Signed   By: Inez Catalina M.D.   On: 04/12/2016 14:47    Procedures Procedures (including critical care time)  Medications Ordered in ED Medications  torsemide (DEMADEX) tablet 40 mg (not administered)  gi cocktail (Maalox,Lidocaine,Donnatal) (30 mLs Oral Given 04/12/16 1539)  potassium chloride SA (K-DUR,KLOR-CON) CR tablet 80 mEq (80 mEq Oral Given 04/12/16 1627)  potassium chloride 30 mEq in sodium chloride 0.9 % 265 mL (KCL MULTIRUN) IVPB (30 mEq Intravenous Given 04/12/16 1953)  isosorbide-hydrALAZINE (BIDIL) 20-37.5 MG per tablet 1 tablet (1 tablet Oral Given 04/12/16 2248)  spironolactone (ALDACTONE) tablet 12.5 mg (12.5 mg Oral Given 04/12/16 2248)     Initial Impression / Assessment and Plan / ED Course  I have reviewed the triage vital signs and the nursing notes.  Pertinent labs & imaging results that were available during my care of the patient were reviewed by me and considered in my medical decision making (see chart for details).     56 year old female who presents with intermittent abdominal pain and shortness of breath when eating. Well appearing with stable vitals. Soft and nonsurgical abdomen. No significant tenderness on  exam. No concerns for serious intraabdominal process at this time. Clinically w/o symptoms of obstruction and exam not concerning for infection such as diverticulitis or appendicitis or other acute processes. Does not look fluid overloaded. CXR visualized and w/o edema or infiltrate. BNP normal. Not likely CHF symptoms. Symptoms more suggestive of reflux indigestion as dyspnea only occurs while eating. Given GI cocktail and feels significantly improved. Has been eating drinking normally in ED w/o complaints.   With hypokalemia, and some QTc prolongation. This is improved after repletion and repeat potassium 3.2 which is her baseline. Felt to be stable for discharge home. Strict return and follow-up instructions reviewed. She expressed understanding of all discharge instructions and felt comfortable with the plan of care.    Final Clinical Impressions(s) / ED Diagnoses   Final diagnoses:  Hypokalemia  Gastroesophageal reflux disease, esophagitis presence not specified    New Prescriptions Discharge Medication List as of 04/13/2016 12:28 AM       Forde Dandy, MD 04/13/16 [phone]

## 2016-04-12 NOTE — ED Triage Notes (Signed)
Pt complains of abdominal pain and swelling for the past 4 days, shortness of breath since yesterday. Pt took a breathing treatment yesterday and 1 hour prior to arrival to ED, which she states helped.

## 2016-04-13 LAB — POTASSIUM: POTASSIUM: 3.2 mmol/L — AB (ref 3.5–5.1)

## 2016-04-13 NOTE — Discharge Instructions (Signed)
Please follow-up with your pcp or heart doctor for potassium recheck in 1 week. Continue taking your medications as prescribed. Return for worsening symptoms, including fever, intractable vomiting, or any other symptoms concerning to you.

## 2016-04-17 ENCOUNTER — Telehealth: Payer: Self-pay | Admitting: Internal Medicine

## 2016-04-17 ENCOUNTER — Ambulatory Visit: Payer: Managed Care, Other (non HMO) | Admitting: Internal Medicine

## 2016-04-17 NOTE — Telephone Encounter (Signed)
Pt states she has been seeing her PCP for IBS constipation and it is not working. States she was on linzess for 1 month but then she started bleeding. PCP then told her to take miralax daily but she doesn't think she should have to do this, states she has to take 2-3 doses to have a BM. Also states her colon starts having spasms. Pt scheduled to see Amy Esterwood PA 05/01/16@2pm , pt aware of appt.

## 2016-04-22 ENCOUNTER — Other Ambulatory Visit: Payer: Self-pay | Admitting: Cardiology

## 2016-04-22 NOTE — Telephone Encounter (Signed)
Rx request sent to pharmacy.  

## 2016-05-01 ENCOUNTER — Ambulatory Visit (INDEPENDENT_AMBULATORY_CARE_PROVIDER_SITE_OTHER): Payer: Managed Care, Other (non HMO) | Admitting: Physician Assistant

## 2016-05-01 ENCOUNTER — Encounter: Payer: Self-pay | Admitting: Physician Assistant

## 2016-05-01 ENCOUNTER — Other Ambulatory Visit (INDEPENDENT_AMBULATORY_CARE_PROVIDER_SITE_OTHER): Payer: Managed Care, Other (non HMO)

## 2016-05-01 VITALS — BP 100/64 | HR 88 | Ht 63.0 in | Wt 309.4 lb

## 2016-05-01 DIAGNOSIS — K602 Anal fissure, unspecified: Secondary | ICD-10-CM | POA: Diagnosis not present

## 2016-05-01 DIAGNOSIS — K5900 Constipation, unspecified: Secondary | ICD-10-CM | POA: Diagnosis not present

## 2016-05-01 LAB — BASIC METABOLIC PANEL
BUN: 17 mg/dL (ref 6–23)
CHLORIDE: 102 meq/L (ref 96–112)
CO2: 27 mEq/L (ref 19–32)
CREATININE: 0.99 mg/dL (ref 0.40–1.20)
Calcium: 9.2 mg/dL (ref 8.4–10.5)
GFR: 74.24 mL/min (ref >60.00)
Glucose, Bld: 98 mg/dL (ref 70–99)
POTASSIUM: 3.9 meq/L (ref 3.5–5.1)
SODIUM: 139 meq/L (ref 135–145)

## 2016-05-01 LAB — TSH: TSH: 1.05 u[IU]/mL (ref 0.35–4.50)

## 2016-05-01 MED ORDER — PEG-KCL-NACL-NASULF-NA ASC-C 100 G PO SOLR: 1.0000 | ORAL | 0 refills | Status: DC

## 2016-05-01 MED ORDER — DILTIAZEM GEL 2 %: 1.0000 "application " | Freq: Two times a day (BID) | CUTANEOUS | 3 refills | Status: DC

## 2016-05-01 NOTE — Progress Notes (Signed)
Subjective:    Patient ID: [name], female    DOB: 03-05-1960, 56 y.o.   MRN: [id number]  HPI [name] is a pleasant 56 year old African-American female known to Dr. Scarlette Shorts [facility] She was last seen in our office about a year ago with complaints of mild constipation and chronic GERD. Colonoscopy was discussed but she was felt to be higher risk for sedation and decision was made to screen with Cologuard. Patient did not follow through with Cologuard screening. She says she decided she did not want to do that, and  wants to have a colonoscopy. She states that she discussed this with her cardiologist Dr. Johnney Ou , who also advised she should be considered for colonoscopy. She has history of hypertension, morbid obesity, with BMI of 54, congestive heart failure with normal EF, obstructive sleep apnea with no oxygen use, COPD, history of chronic renal insufficiency. She did have colonoscopy in 2005 which was a normal exam with the exception of small internal hemorrhoids. Patient says she has been having worsening problems with constipation over the past year. She is now having a bowel movement every 2-4 days. She says if she goes more than a couple of days between bowel movements she gets lower abdominal cramping and pain and is very uncomfortable. She says over the past month she has been having anorectal pain "swelling" internally. She has been having pain with defecation. She had been seen by her PCP and had been given Proctofoam in the past which did not seem to help. She was given a another hemorrhoid cream which caused burning with application. She is currently using 2 doses of MiraLAX daily in about 20 ounces of fluid and hasn't noticed any significant improvement. Did try Linzess, briefly but caused diarrhea. She restarted and says it didn't work well so stopped it. She has been seeing some intermittent bright red blood on the tissue. She is on dicyclomine which she uses for cramping 4 times  daily. No prior abdominal surgery. Family history negative for colon cancer and IBD as far she is aware.   Review of Systems Pertinent positive and negative review of systems were noted in the above HPI section.  All other review of systems was otherwise negative.  Outpatient Encounter Prescriptions as of 05/01/2016  Medication Sig  . aspirin 81 MG EC tablet Take 1 tablet (81 mg total) by mouth daily.  [name] BIDIL 20-37.5 MG tablet Take 1 tablet by mouth 3 (three) times daily.  . budesonide-formoterol (SYMBICORT) 80-4.5 MCG/ACT inhaler Inhale 2 puffs into the lungs 2 (two) times daily as needed (For shortness of breath.).   [name] chlorpheniramine (CHLOR-TRIMETON) 4 MG tablet Take 4 mg by mouth every 4 (four) hours as needed for allergies.   [name] dicyclomine (BENTYL) 10 MG capsule TAKE 1 CAPSULE(10 MG) BY MOUTH FOUR TIMES DAILY BEFORE MEALS AND AT BEDTIME  . famotidine (PEPCID) 20 MG tablet One at bedtime  . fluticasone (FLONASE) 50 MCG/ACT nasal spray Place 2 sprays into both nostrils daily.  [name] losartan (COZAAR) 100 MG tablet TAKE 1 TABLET (100 MG TOTAL) BY MOUTH DAILY.  . meclizine (ANTIVERT) 25 MG tablet Take 25 mg by mouth 3 (three) times daily as needed for dizziness.  . metolazone (ZAROXOLYN) 2.5 MG tablet TAKE 1 TABLET DAILY 30 MIN. BEFORE LASIX(TAKE ONLY IF WEIGHT INCREASE NOT IMPROVED WITH LASIX) (Patient taking differently: TAKE 1 TABLET DAILY AS NEEDED FOR WEIGHT GAIN OR LEG SWELLING.)  . montelukast (SINGULAIR) 10 MG tablet Take 1 tablet (  10 mg total) by mouth at bedtime.  . pantoprazole (PROTONIX) 40 MG tablet TAKE 1 TABLET BY MOUTH DAILY, TAKE 30 TO 60 MINUTES BEFORE FIRST MEAL OF THE DAY  . polyethylene glycol powder (GLYCOLAX/MIRALAX) powder Take 68 g by mouth daily.  . potassium chloride SA (K-DUR,KLOR-CON) 20 MEQ tablet TAKE 2 TABLET BY MOUTH THREE TIMES DAILY AND 1 TABLET EXTRA AS NEEDED  . spironolactone (ALDACTONE) 25 MG tablet Take 0.5 tablets (12.5 mg total) by mouth 2 (two) times  daily.  [name] topiramate (TOPAMAX) 25 MG tablet Take 25 mg by mouth 2 (two) times daily as needed (for headaches).   . traMADol (ULTRAM) 50 MG tablet Take 1 tablet (50 mg total) by mouth every 6 (six) hours as needed for moderate pain.  [name] diltiazem 2 % GEL Apply 1 application topically 2 (two) times daily.  . peg 3350 powder (MOVIPREP) 100 g SOLR Take 1 kit (200 g total) by mouth as directed.  . torsemide (DEMADEX) 20 MG tablet Take 2 tablets (40 mg total) by mouth 2 (two) times daily.  . [DISCONTINUED] Ascorbic Acid (VITAMIN C) 100 MG tablet Take 100 mg by mouth daily.  . [DISCONTINUED] benzonatate (TESSALON) 100 MG capsule Take by mouth 3 (three) times daily as needed for cough.  . [DISCONTINUED] lactulose (CHRONULAC) 10 GM/15ML solution Take 15 mLs by mouth 2 (two) times daily as needed for constipation.  . [DISCONTINUED] linaclotide (LINZESS) 145 MCG CAPS capsule Take 145 mcg by mouth daily as needed (constipation).  . [DISCONTINUED] potassium chloride SA (K-DUR,KLOR-CON) 20 MEQ tablet Take 60 mEq by mouth 3 (three) times daily.  . [DISCONTINUED] pramoxine-hydrocortisone (EPIFOAM) 1-1 % foam Apply 1 applicator topically 3 (three) times daily as needed (hemorrhoids). Use as directed    No facility-administered encounter medications on file as of 05/01/2016.    Allergies  Allergen Reactions  . Sulfonamide Derivatives Anaphylaxis, Swelling, Rash and Other (See Comments)    Swelling tongue and ear   . Doxycycline Nausea And Vomiting  . Latex Hives  . Ciprofloxacin Rash  . Fish Allergy Rash and Other (See Comments)    Only reaction to Mackerel.  No other issues with any type of fish or shellfish currently   . Hydrocortisone Rash  . Neomycin Rash   Patient Active Problem List   Diagnosis Date Noted  . Cough variant asthma 07/31/2015  . Upper airway cough syndrome 04/11/2015  . Severe obesity (BMI >= 40) ([place]) 04/11/2015  . Bronchitis, chronic obstructive w acute bronchitis ([place]) 04/11/2015    . Dyspnea   . Acute on chronic diastolic CHF (congestive heart failure), NYHA class 2 ([place]) 03/27/2015  . Morbid obesity due to excess calories ([place]) 03/27/2015  . Dysphagia 03/27/2015  . Hyponatremia 03/27/2015  . Acute kidney injury ([place]) 03/27/2015  . Hypokalemia 03/27/2015  . Leukocytosis 03/27/2015  . COPD exacerbation ([place]) 03/27/2015  . Sinusitis, chronic 03/10/2012  . Hyperlipidemia 10/02/2006  . Obstructive sleep apnea 06/09/2006  . Essential hypertension 06/09/2006   Social History   Social History  . Marital status: Widowed    Spouse name: N/A  . Number of children: 2  . Years of education: N/A   Occupational History  . Retired    Social History Main Topics  . Smoking status: Never Smoker  . Smokeless tobacco: Never Used  . Alcohol use No  . Drug use: No  . Sexual activity: Not on file   Other Topics Concern  . Not on file   Social History Narrative  Charity fundraiser, Oceanographer.   Widowed, lives with son.    Ms. Delgadillo family history includes Asthma in her sister; Heart attack in her mother; Heart disease in her mother; Hypertension in her mother.      Objective:    Vitals:   05/01/16 1352  BP: 100/64  Pulse: 88    Physical Exam well-developed morbidly obese African-American female in no acute distress, pleasant blood pressure 100/64, pulse 88, height 5 foot 3, weight 309, BMI 54.8. HEENT; nontraumatic normocephalic EOMI PERRLA sclera anicteric, Cardiovascular ;regular rate and rhythm with S1-S2 no murmur or gallop, Pulmonary; clear bilaterally, Abdomen ;soft, she is mild tenderness in the lower abdomen and left lower quadrant there is no guarding or rebound no palpable mass or hepatosplenomegaly bowel sounds present, Rectal ;exam no external hemorrhoids noted she has a small sentinel tag and is exquisitely tender on anal exam anteriorly consistent with an anal fissure she could not tolerate further digital exam, Extremities ;no clubbing  cyanosis or edema skin warm and dry, Neuropsych ;mood and affect appropriate       Assessment & Plan:   #70 56 year old female with worsening constipation 1 year probably combination of functional and medication induced, on multiple meds Rule out occult colon lesion #2 anal pain consistent with anal fissure on exam #3 morbid obesity-BMI 54 #4 sleep apnea with CPAP used no oxygen #5 congestive heart failure/normal EF #6 hypertension   #7 chronic renal insufficiency #8 COPD  Plan; Patient will be scheduled for colonoscopy with Dr. Henrene Pastor at [facility] due to BMI over 50. (Procedure scheduled for Thursday ,June 28/hospital week). Procedure discussed in detail with patient including risks and benefits and she is agreeable to proceed.  Start diltiazem gel 2% apply to anal fissure 3-4 times daily, also advised to start recticare/lidocaine 5% 3-4 times daily as needed for discomfort and We discussed the natural course of prolonged healing with anal fissures and advised her to expect this to take 3-4 months to completely clear  She will continue MiraLAX, but increase to 17 g in 8 ounces of water 3 doses daily, and if this is ineffective 4 doses daily.  BMET, TSH.     Amy Genia Harold PA-C 05/01/2016   Cc: Bartholome Bill, MD

## 2016-05-01 NOTE — Patient Instructions (Signed)
You have been scheduled for a colonoscopy. Please follow written instructions given to you at your visit today.  Please pick up your prep supplies at the pharmacy within the next 1-3 days. If you use inhalers (even only as needed), please bring them with you on the day of your procedure. Your physician has requested that you go to www.[email or web address].com and enter the access code given to you at your visit today. This web site gives a general overview about your procedure. However, you should still follow specific instructions given to you by our office regarding your preparation for the procedure.  We have sent a prescription for Diltiazem 2% gel to [facility]. You should apply a pea size amount to your rectum three times daily x 6-8 weeks.  [facility]'s information is below: Address: [address], [place] [address]  [phone]  Increase Miralax to 2 doses twice daily.   Please purchase the following medications over the counter and take as directed: Reticare apply to anus 3-4 daily as needed for discomfort.   Your physician has requested that you go to the basement for lab work before leaving today.

## 2016-05-02 ENCOUNTER — Telehealth: Payer: Self-pay | Admitting: Physician Assistant

## 2016-05-02 MED ORDER — NITROGLYCERIN 0.4 % RE OINT: 1.0000 "application " | TOPICAL_OINTMENT | Freq: Two times a day (BID) | RECTAL | 3 refills | Status: DC

## 2016-05-02 NOTE — Telephone Encounter (Signed)
Spoke to patient and sent Rx to pharmacy.

## 2016-05-02 NOTE — Progress Notes (Signed)
Assessment and plans for colonoscopy at hospital in high risk patient noted

## 2016-05-02 NOTE — Telephone Encounter (Signed)
Patient states the [place] Lighter is too expensive over 700 and Nitroglycerin is 40. Patient is wanting alternative options. Best call back [phone].

## 2016-05-02 NOTE — Telephone Encounter (Signed)
Can try Rectiv 0.4% nitroglycerin ointment twice daily x 10 weeks

## 2016-05-03 NOTE — Telephone Encounter (Signed)
Spoke to patient she states her Rx was only $40 and she did get it from the pharmacy. She also would like to cancel her colonoscopy appointment because her daughter would like to be with her and she is not available until July. Either the 9th-16th or 20th-31st. I informed her that we did not have the hospital schedule for July yet and I will call her back probably at the end of the month.

## 2016-05-20 ENCOUNTER — Telehealth: Payer: Self-pay | Admitting: Adult Health

## 2016-05-20 ENCOUNTER — Telehealth: Payer: Self-pay | Admitting: Cardiology

## 2016-05-20 NOTE — Telephone Encounter (Signed)
Can't help her over the phone as already on all the usual meds > ov with all meds in hand asap, ok to add on

## 2016-05-20 NOTE — Telephone Encounter (Signed)
Pt c/o worsening chest congestion, prod cough with clear/white mucus X3 days.  Denies fever, chest pain, sinus congestion.    Pt taking mucinex dm, robitussin dm- these are causing nausea.  Verified pt is taking these meds with food.  Pt also using neb tx.  Requesting further recs.    Pt uses [facility] on [address].    MW please advise on recs.  Thanks!   10/09/15 AVS with TP: Instructions     Return in about 3 months (around 01/08/2016) for Follow up with Dr. [name].  Augmentin 875mg  Twice daily  For 7 days ,take with food Mucinex DM Twice daily  As needed  Cough/congestion  Phenergan w/ codeine 1 tsp every 6hr As needed  Cough , may make you sleepy Saline nasal rinses As needed   follow up Dr. [name]  In 3 months and As needed   Please contact office for sooner follow up if symptoms do not improve or worsen or seek emergency care

## 2016-05-20 NOTE — Telephone Encounter (Signed)
Spoke with pt, c/o worsening chest congestion, prod cough with clear/white mucus X3 days.  Denies fever, chest pain, sinus congestion.  pt has already called pulmonary. Message noted per Dr [name] message:Can't help her over the phone as already on all the usual meds > ov with all meds in hand asap, ok to add on. Informed pt and told her to call there ASAP, pt verbalizes understanding

## 2016-05-20 NOTE — Telephone Encounter (Signed)
Patient calling, states that she is "spitting out clear fluid." Patient also states that she has " a pill for emergencies for extra fluid." Patient would like to know if she needs to take that medication. Thanks.

## 2016-05-20 NOTE — Telephone Encounter (Signed)
Spoke with pt, aware of recs.  Scheduled to see MW tomorrow at 12:00.  Nothing further needed.

## 2016-05-21 ENCOUNTER — Ambulatory Visit (INDEPENDENT_AMBULATORY_CARE_PROVIDER_SITE_OTHER): Payer: Managed Care, Other (non HMO) | Admitting: Internal Medicine

## 2016-05-21 ENCOUNTER — Encounter: Payer: Self-pay | Admitting: Internal Medicine

## 2016-05-21 VITALS — BP 106/60 | HR 99 | Ht 63.0 in | Wt 298.8 lb

## 2016-05-21 DIAGNOSIS — R05 Cough: Secondary | ICD-10-CM

## 2016-05-21 DIAGNOSIS — J329 Chronic sinusitis, unspecified: Secondary | ICD-10-CM

## 2016-05-21 DIAGNOSIS — R058 Other specified cough: Secondary | ICD-10-CM

## 2016-05-21 DIAGNOSIS — J45991 Cough variant asthma: Secondary | ICD-10-CM | POA: Diagnosis not present

## 2016-05-21 MED ORDER — MECLIZINE HCL 25 MG PO TABS: 25.0000 mg | ORAL_TABLET | Freq: Three times a day (TID) | ORAL | 11 refills | Status: DC | PRN

## 2016-05-21 MED ORDER — BENZONATATE 200 MG PO CAPS: 200.0000 mg | ORAL_CAPSULE | Freq: Three times a day (TID) | ORAL | 0 refills | Status: DC | PRN

## 2016-05-21 MED ORDER — TRAMADOL HCL 50 MG PO TABS: 50.0000 mg | ORAL_TABLET | Freq: Four times a day (QID) | ORAL | 0 refills | Status: DC | PRN

## 2016-05-21 NOTE — Assessment & Plan Note (Signed)
Body mass index is 52.93 kg/m.  -  trending up Lab Results  Component Value Date   TSH 1.05 05/01/2016     Contributing to gerd risk/ doe/reviewed the need and the process to achieve and maintain neg calorie balance > defer f/u primary care including intermittently monitoring thyroid status

## 2016-05-21 NOTE — Assessment & Plan Note (Signed)
Refractory since Jan 2017  NO  06/01/2015   =  8 - Spirometry 06/01/2015  No obstruction/ poor effort/ reproducibility  Allergy profile 06/01/2015 >  Eos 0.4 /  IgE  35 neg RAST - trial of singulair 06/01/2015 >>> - Sinus CT 06/07/2015 > No definite sinusitis. Only a minimal amount of debris is noted within the right partition of the sphenoid sinus.  - Re CT sinus 05/21/2016 >>>    I had an extended discussion with the patient reviewing all relevant studies completed to date and  lasting 25 minutes of a 40  minute acute  visit addressing acute on chronic  severe non-specific but potentially very serious refractory respiratory symptoms of uncertain and potentially multiple  etiologies.   Explained chronic cough philosphy: The standardized cough guidelines published in Chest by Lissa Morales in 2006 are still the best available and consist of a multiple step process (up to 12!) , not a single office visit,  and are intended  to address this problem logically,  with an alogrithm dependent on response to empiric treatment at  each progressive step  to determine a specific diagnosis with  minimal addtional testing needed. Therefore if adherence is an issue or can't be accurately verified,  it's very unlikely the standard evaluation and treatment will be successful here.    Furthermore, response to therapy (other than acute cough suppression, which should only be used short term with avoidance of narcotic containing cough syrups if possible), can be a gradual process for which the patient is not likely to  perceive immediate benefit.  Unlike going to an eye doctor where the best perscription is almost always the first one and is immediately effective, this is almost never the case in the management of chronic cough syndromes. Therefore the patient needs to commit up front to consistently adhere to recommendations  for up to 6 weeks of therapy directed at the likely underlying problem(s) before the response can  be reasonably evaluated.   Will need to first return with all meds in hand using a trust but verify approach to confirm accurate Medication  Reconciliation The principal here is that until we are certain that the  patients are doing what we've asked, it makes no sense to ask them to do more.   In meantime repeat the sinus ct to complete the w/u  Consider trial of gabapentin to reduce dependence on tramadol  Each maintenance medication was reviewed in detail including most importantly the difference between maintenance and prns and under what circumstances the prns are to be triggered using an action plan format that is not reflected in the computer generated alphabetically organized AVS.    Please see AVS for specific instructions unique to this office visit that I personally wrote and verbalized to the the pt in detail and then reviewed with pt  by my nurse highlighting any changes in therapy/plan of care  recommended at today's visit.

## 2016-05-21 NOTE — Patient Instructions (Signed)
Please see patient coordinator before you leave today  to schedule sinus CT   See calendar for specific medication instructions and bring it back for each and every office visit for every healthcare provider you see.  Without it,  you may not receive the best quality medical care that we feel you deserve.  You will note that the calendar groups together  your maintenance  medications that are timed at particular times of the day.  Think of this as your checklist for what your doctor has instructed you to do until your next evaluation to see what benefit  there is  to staying on a consistent group of medications intended to keep you well.  The other group at the bottom is entirely up to you to use as you see fit  for specific symptoms that may arise between visits that require you to treat them on an as needed basis.  Think of this as your action plan or "what if" list.   Separating the top medications from the bottom group is fundamental to providing you adequate care going forward.     See [name] NP win 4 weeks with all your medications, even over the counter meds, separated in two separate bags, the ones you take no matter what vs the ones you stop once you feel better and take only as needed when you feel you need them.   [name]  will generate for you a new user friendly medication calendar that will put [place] all on the same page re: your medication use.     Without this process, it simply isn't possible to assure that we are providing  your outpatient care  with  the attention to detail we feel you deserve.   If we cannot assure that you're getting that kind of care,  then we cannot manage your problem effectively from this clinic.  Once you have seen [name] and we are sure that we're all on the same page with your medication use she will arrange follow up with me.

## 2016-05-21 NOTE — Assessment & Plan Note (Signed)
07/31/2015  After extensive coaching HFA effectiveness =    25% > try symbicort 80 2bid and add spacer  - pt off symbicort 05/21/2016 x one month s any discernible airflow obst on spirometry > leave off   Very unlikely this is asthma/ cough variant or otherwise

## 2016-05-21 NOTE — Progress Notes (Signed)
Subjective:   Patient ID: [name], female    DOB: [birth year]   MRN: [id number]  Brief patient profile:  26 yobf never smoker  with known hx of Chronic cough, sinusitis and ? Asthma with recurrent severe cough since her 40's "every few months"    History of Present Illness  06/10/2013 ER follow up  Was seen in ER on 5/15 for bronchitis , tx w/ Zpack and pred taper. CXR w/ no sign of acute changes, noted diffuse interstitial markings. Says got only minimally improved.  Complains of prod cough with green mucus, wheezing, dyspnea, tightness.  finished zpak and pred pak yesterday.  would like to discuss beginning nebs, says RT in ER said she should be on this at home.  Was started on QVAR last ov but not taking .   rec Augmentin 875mg  Twice daily  For 7 days  Mucinex DM Twice daily  As needed  Cough/congestion.  Fluids and rest   Begin Symbicort 80/4.32mcg 2 puffs Twice daily  - rinse after use.      09/17/2013  Acute extended  ov/[name] re: asthma vs uacs Chief Complaint  Patient presents with  . Acute Visit    Pt c/o increased SOB, wheezing and prod cough with large amounts of white sputum x 1 wk.   Sinus problems by Lucia Gaskins x 2012  years and problems with cough/wheezing x a decade. Cough is worse p lie down and try to put on cpap  Much worse x one week but was much better p last ov and when I saw her in 10/2012 and rx as rhinitis/ gerd, not asthma. She has been "maintaining " on an empty symbicort and qvar via spacer. Really only sob at rest when coughing  rec Augmentin 875 mg twice daily x 10 days Prednisone 10 mg take  4 each am x 2 days,   2 each am x 2 days,  1 each am x 2 days and stop  Pantoprazole (protonix) 40 mg   Take 30-60 min before first meal of the day and Pepcid 20 mg one bedtime until return to office - this is the best way to tell whether stomach acid is contributing to your problem.   Take mucinex dm 2  every 12 hours and supplement if needed with  tramadol 50 mg  up to 2 every 4 hours  GERD diet   Symbicort 80 via spacer 80 Take 2 puffs first thing in am and then another 2 puffs about 12 hours later via spacer sample    04/07/2015 acute extended ov/[name] re: chronic cough / min hemoptysis  Chief Complaint  Patient presents with  . Acute Visit    Pt c/o with blood streaked sputum- started this am. She has some nasal congestion.   cough recurrent since Jan 2017 mostly dry / hacking day > noct assoc with nasal congestion but no epistaxis or excess/ purulent sputum or mucus plugs   rec Augmentin 875 mg take one pill twice daily  X 10 days -  Prednisone 10 mg take  4 each am x 2 days,   2 each am x 2 days,  1 each am x 2 days and stop  Pantoprazole (protonix) 40 mg   Take 30-60 min before first meal of the day and Pepcid 20 mg one bedtime until return to office  Take mucinex dm 2  every 12 hours and supplement if needed with  tramadol 50 mg up to 2 every 4 hours  to suppress the urge to cough. GERD diet   06/01/2015  Acute extended ov/[name] re: recurrent cough maint on acid suppression/ diet  Chief Complaint  Patient presents with  . Acute Visit    Pt c/o waking up in the night "throwing up mucus" x 5 days. She also c/o wheezing- using neb 2 x daily on average.   did get a lot  better p last ov, no need for albuterol neb then worse x 2 weeks prior to OV   Cough is now noct/ worse with CPAP  rec schedule sinus CT> min debris sphenoid sinus  Please remember to go to the lab   department  Prednisone 10 mg take  4 each am x 2 days,   2 each am x 2 days,  1 each am x 2 days and stop  Continue Pantoprazole (protonix) 40 mg   Take 30-60 min before first meal of the day and Pepcid 20 mg one bedtime until return   Add montelukast 10 mg at bedtime     07/31/2015  f/u ov/[name] re: chronic cough ? transiently better p prednisone x 2 weeks / off symbicort / on singulair Chief Complaint  Patient presents with  . Follow-up    Increased cough and SOB x 2 wks.  Cough is prod with yellow to white sputum "feels like there is something in my throat".  She is using albuterol neb 2 x daily on average.   cough onset was Jan 2017 / ? Only really better until tramadol runs out?   rec Plan A = Automatic =  symbicort 80 Take 2 puffs first thing in am and then another 2 puffs about 12 hours later - add the spacer if needed - stop it if the cough comes back again while on symbicort Prednisone 10 mg take  4 each am x 2 days,   2 each am x 2 days,  1 each am x 2 days and stop  Plan B = Backup Only use your albuterol as a rescue medication  For drainage / throat tickle try take CHLORPHENIRAMINE  4 mg      05/21/2016  Acute exteneded  ov/[name] re:  Refractory cough since Jan 2017 : uacs/ very little evidence for asthma  Chief Complaint  Patient presents with  . Acute Visit    Increased cough with light yellow sputum x 6 days. She is also having increased SOB and wheezing.   only really better on tramadol/ non adherent with symb/ using alb qid but did not disclose it previously Cough to point of gag/ vomit at hs worse sob/coughing fits x one week   Did not bring meds or med calendar as instructed    No obvious day to day or daytime variability or assoc  mucus plugs or hemoptysis or cp or chest tightness,  or overt sinus or hb symptoms. No unusual exp hx or h/o childhood pna/ asthma or knowledge of premature birth.   Also denies any obvious fluctuation of symptoms with weather or environmental changes or other aggravating or alleviating factors except as outlined above   Current Medications, Allergies, Complete Past Medical History, Past Surgical History, Family History, and Social History were reviewed in [employer] record.  ROS  The following are not active complaints unless bolded sore throat, dysphagia, dental problems, itching, sneezing,  nasal congestion or excess/ purulent secretions, ear ache,   fever, chills, sweats, unintended wt  loss, classically pleuritic or exertional cp,  orthopnea pnd  or leg swelling, presyncope, palpitations, abdominal pain, anorexia, nausea, vomiting, diarrhea  or change in bowel or bladder habits, change in stools or urine, dysuria,hematuria,  rash, arthralgias, visual complaints, headache, numbness, weakness or ataxia or problems with walking or coordination,  change in mood/affect or memory.             Objective:   Physical Exam  amb massively obese bf nad/ flat affect   06/01/2015        294  > 07/31/2015   295 >   05/21/2016   298  04/07/2015        297   09/17/13 303 lb 12.8 oz (137.803 kg)  06/25/13 307 lb (139.254 kg)  06/10/13 313 lb 3.2 oz (142.067 kg)   Vital signs reviewed   - Note on arrival 02 sats  96% on RA      HEENT:  [place]/AT,  EACs-clear, TMs-wnl, NOSE-clear, THROAT-clear, no lesions, no postnasal drip or exudate noted.   NECK:  Supple w/ fair ROM; no JVD; normal carotid impulses w/o bruits; no thyromegaly or nodules palpated; no lymphadenopathy.    RESP  Completely clear to A and P with no cough on insp/exp   CARD:  RRR, no m/r/g  ,  Trace   lower ext bilateral sym  pitting edema/ pulses intact, no cyanosis or clubbing.  GI:   Soft & nt; nml bowel sounds; no organomegaly or masses detected.   Musco: Warm bil, no deformities or joint swelling noted.   Neuro: alert, no focal deficits noted.    Skin: Warm, no lesions or rashes       I personally reviewed images and agree with radiology impression as follows:  CXR:   04/12/16 No active cardiopulmonary disease.                       Assessment & Plan:

## 2016-05-21 NOTE — Assessment & Plan Note (Signed)
CT sinuses 02/2012:  Chronic sinusitis with small A/F levels >> rx by ENT with prolonged augmentin F/u ct sinuses 04/2012:  Persistent sinus thickening >> rx with levaquin and clinda for 30 days.  Told to return for sinus scan in 6 weeks >> no showed for followup visit.  CT sinuses 11/2013:  Acute and chronic sinusitis noted.   - repeat CT sinus indicated for refractory hs cough > ordered

## 2016-05-22 ENCOUNTER — Other Ambulatory Visit: Payer: Managed Care, Other (non HMO)

## 2016-05-22 ENCOUNTER — Ambulatory Visit: Payer: Managed Care, Other (non HMO) | Admitting: Internal Medicine

## 2016-05-23 ENCOUNTER — Ambulatory Visit
Admission: RE | Admit: 2016-05-23 | Discharge: 2016-05-23 | Disposition: A | Discharge status: Home / Self Care | Payer: Managed Care, Other (non HMO) | Source: Ambulatory Visit | Attending: Internal Medicine | Admitting: Internal Medicine

## 2016-05-23 ENCOUNTER — Telehealth: Payer: Self-pay | Admitting: Internal Medicine

## 2016-05-23 DIAGNOSIS — J329 Chronic sinusitis, unspecified: Secondary | ICD-10-CM

## 2016-05-23 NOTE — Telephone Encounter (Signed)
Spoke with pt, states she was told by her insurance that there is "no date" on her CT scan ordered..I advised pt that I would call [facility] to see what further is needed regarding this. Called [facility], states that our office will need to call [employer] to see what is needed for CT to be authorized, as it is not currently authorized. Per [place], they will be calling pt to cx today's appt and reschedule CT.    Forwarding to PCC's to follow up on- PCC's please advise.  Thanks.

## 2016-05-23 NOTE — Telephone Encounter (Signed)
There is a Therapist, sports from [employer] on this referral I have called the patient and made her aware.

## 2016-05-23 NOTE — Telephone Encounter (Signed)
Patient is calling back about this stating [facility] is calling her.  CB is [phone]. Marked as URGENT.

## 2016-05-24 ENCOUNTER — Telehealth: Payer: Self-pay | Admitting: Internal Medicine

## 2016-05-24 DIAGNOSIS — R05 Cough: Secondary | ICD-10-CM

## 2016-05-24 DIAGNOSIS — R058 Other specified cough: Secondary | ICD-10-CM

## 2016-05-24 MED ORDER — CEFDINIR 300 MG PO CAPS: 300.0000 mg | ORAL_CAPSULE | Freq: Two times a day (BID) | ORAL | 0 refills | Status: DC

## 2016-05-24 NOTE — Telephone Encounter (Signed)
MW  Please Advise-  Pt called and stated she is still not better. She states she still has a productive cough with thick yellowish phlegm,slight wheezing,coughing spells causing her increase sob,giving herself a breathing treatment to try to help,sinus pressure, Denies fever,chest tightness   Allergies  Allergen Reactions  . Sulfonamide Derivatives Anaphylaxis, Swelling, Rash and Other (See Comments)    Swelling tongue and ear   . Doxycycline Nausea And Vomiting  . Latex Hives  . Ciprofloxacin Rash  . Fish Allergy Rash and Other (See Comments)    Only reaction to Mackerel.  No other issues with any type of fish or shellfish currently   . Hydrocortisone Rash  . Neomycin Rash   Editor: Tanda Rockers, MD (Physician)    Please see patient coordinator before you leave today  to schedule sinus CT   See calendar for specific medication instructions and bring it back for each and every office visit for every healthcare provider you see.  Without it,  you may not receive the best quality medical care that we feel you deserve.  You will note that the calendar groups together  your maintenance  medications that are timed at particular times of the day.  Think of this as your checklist for what your doctor has instructed you to do until your next evaluation to see what benefit  there is  to staying on a consistent group of medications intended to keep you well.  The other group at the bottom is entirely up to you to use as you see fit  for specific symptoms that may arise between visits that require you to treat them on an as needed basis.  Think of this as your action plan or "what if" list.   Separating the top medications from the bottom group is fundamental to providing you adequate care going forward.     See [name] NP win 4 weeks with all your medications, even over the counter meds, separated in two separate bags, the ones you take no matter what vs the ones you stop once you feel  better and take only as needed when you feel you need them.   [name]  will generate for you a new user friendly medication calendar that will put [place] all on the same page re: your medication use.     Without this process, it simply isn't possible to assure that we are providing  your outpatient care  with  the attention to detail we feel you deserve.   If we cannot assure that you're getting that kind of care,  then we cannot manage your problem effectively from this clinic.  Once you have seen [name] and we are sure that we're all on the same page with your medication use she will arrange follow up with me.

## 2016-05-24 NOTE — Telephone Encounter (Signed)
No further recs then

## 2016-05-24 NOTE — Telephone Encounter (Signed)
Based on her CT sinus which is negative the best option for cough and face pain  is gabapentin  100 tid   For yellow mucus can do omnicef 300 mg bid x 10 days only   Can't do any thing further over the phone until returns for full medication reconciliation as rec at last ov

## 2016-05-24 NOTE — Progress Notes (Signed)
Spoke with pt and notified of results per Dr. Wert. Pt verbalized understanding and denied any questions. 

## 2016-05-24 NOTE — Telephone Encounter (Signed)
Called and spoke with pt and she is aware of MW recs.  Nothing further is needed 

## 2016-05-24 NOTE — Telephone Encounter (Signed)
Pt is aware of MW's recommendations. Pt states she has been on gabapentin previously and it was d/c by her GI and urologist due to her dx of IBS. Rx for omnicef has been sent to preferred pharmacy.  MW please advise. Thanks.

## 2016-06-19 ENCOUNTER — Ambulatory Visit: Payer: Managed Care, Other (non HMO) | Admitting: Student

## 2016-06-20 ENCOUNTER — Encounter: Payer: Managed Care, Other (non HMO) | Admitting: Adult Health

## 2016-06-20 ENCOUNTER — Telehealth: Payer: Self-pay | Admitting: Cardiology

## 2016-06-20 ENCOUNTER — Ambulatory Visit: Payer: Managed Care, Other (non HMO) | Admitting: Pulmonary Disease

## 2016-06-20 NOTE — Telephone Encounter (Signed)
New Message     Pt is very tired and sluggish and having hot flashes , does she need to go to ER.  She gave herself a breathing treatment and it helped a little bit. Pt states she is not having any chest pains or sob, but she has not felt well for 3 days and thinks its her potassium

## 2016-06-20 NOTE — Telephone Encounter (Signed)
Spoke with patient and she stated she was just feeling sluggish and tired, no other symptoms. Per patient she is taking 9 K+ a day  Advised patient to call PCP to see if she could be evaluated

## 2016-06-21 ENCOUNTER — Telehealth: Payer: Self-pay | Admitting: Cardiology

## 2016-06-21 ENCOUNTER — Ambulatory Visit: Payer: Managed Care, Other (non HMO) | Admitting: *Deleted

## 2016-06-21 VITALS — BP 117/71 | HR 87

## 2016-06-21 DIAGNOSIS — I10 Essential (primary) hypertension: Secondary | ICD-10-CM

## 2016-06-21 DIAGNOSIS — Z79899 Other long term (current) drug therapy: Secondary | ICD-10-CM

## 2016-06-21 MED ORDER — BLOOD PRESSURE MONITORING DEVI: 1.0000 | Freq: Every morning | 0 refills | Status: DC

## 2016-06-21 MED ORDER — LOSARTAN POTASSIUM 100 MG PO TABS: 50.0000 mg | ORAL_TABLET | Freq: Every day | ORAL | 2 refills | Status: DC

## 2016-06-21 NOTE — Telephone Encounter (Signed)
Patient would like to come by the office for [place] to get an accurate bp reading. Okay given for her to come to the office for check.

## 2016-06-21 NOTE — Telephone Encounter (Signed)
Spoke to patient-aware rx was sentfor BP device.  Also states she was instructed to have her cardiologist look at her blood work from yesterday ASAP as her kidney function was elevated.  Routed to Dr. Percival Spanish to review.  (Labs in care everywhere)

## 2016-06-21 NOTE — Telephone Encounter (Signed)
Patient arrived for BP check-Bp Right arm 127/76 HR 91 Left arm 117/71 HR .  Patient took 1/2 losartan and Bidil this AM.     Spoke to Dr. Percival Spanish, advised to continue 1/2 tablet (50mg ) losartan and continue current dose of Bidil.   Patient verbalized understanding.  Spoke with patient in depth in regards to HF and prevention but also staying hydrated as well.  Patient aware to follow 1.5L-2L daily and restrict salt intake.  Also advised to continue to monitor BP and HR.  Also offerred first avalaible appt with APP-appt made at checkout.    Patient also requesting RX for blood pressure cuff.  Rx sent to pharmacy to see if can assist in getting machine.

## 2016-06-21 NOTE — Progress Notes (Signed)
Patient arrived for BP check-Bp Right arm 127/76 HR 91 Left arm 117/71 HR 87 .  Patient took 1/2 losartan and Bidil this AM.     Spoke to Dr. Percival Spanish, advised to continue 1/2 tablet (50mg ) losartan and continue current dose of Bidil.   Patient verbalized understanding.  Spoke with patient in depth in regards to HF and prevention but also staying hydrated as well.  Patient aware to follow 1.5L-2L daily and restrict salt intake.  Also advised to continue to monitor BP and HR.  Also offerred first avalaible appt with APP-appt made at checkout.    Patient also requesting RX for blood pressure cuff.  Rx sent to pharmacy to see if can assist in getting machine.

## 2016-06-21 NOTE — Telephone Encounter (Signed)
Pt verbalized that she is to call in her bp reading   136/60  Hr 115

## 2016-06-21 NOTE — Telephone Encounter (Signed)
New message      Pt c/o BP issue: STAT if pt c/o blurred vision, one-sided weakness or slurred speech  1. What are your last 5 BP readings?  54/53 and 98/56 at PCP office yesterday (pt stayed at PCP office for 2 hrs trying to get bp up) 2. Are you having any other symptoms (ex. Dizziness, headache, blurred vision, passed out)?  Fatigue 3. What is your BP issue?  Bp is too low.  PCP instructed pt to not take bp meds today.  Please call before noon today

## 2016-06-21 NOTE — Telephone Encounter (Signed)
Per Dr. Percival Spanish who reviewed labs from PCP-hold diuretics at this time and repeat BMET on Tuesday.  Patient aware and will have labs drawn at [facility].   Order placed and appt scheduled.

## 2016-06-21 NOTE — Telephone Encounter (Signed)
Follow up      Pt forgot to get presc for bp machine so that she can check her bp at home.  Also, pt received a phone call from Loretto Parrett's office stating that her kidney function is very low and want pt to have Dr Percival Spanish look at the lab and call pt to adjust medication if needed.  Please call pt

## 2016-06-21 NOTE — Telephone Encounter (Signed)
Spoke with pt, she has not taken any of her medications since yesterday morning. Cautioned patient about increasing fluid intake due to heart failure. She denies SOB, edema or weight gain. She is unable to check her bp at home and will go to the pharmacy to check it around 12 noon today. Discussed with dr hochrein, patient instructed to take her bidil and 1/2 of the dose of losartan this morning. She will call after checking her bp to let us know what it is so we can figure out what to do about her medications over the weekend.

## 2016-06-23 NOTE — Telephone Encounter (Signed)
This has been addressed with the patient and her meds adjusted and she is to have repeat labs early this week.

## 2016-06-24 ENCOUNTER — Telehealth: Payer: Self-pay | Admitting: Cardiology

## 2016-06-24 NOTE — Telephone Encounter (Signed)
Returned call to patient. Charlotte Deer, RN instructions from 6/1 regarding medications. Patient states she was told to have lab work on Tuesday 6/5 (scheduled for [employer]). Patient aware she will not need to be fasting.   She also states she cannot make her appt on 6/7 with [place], [place] @ 1030 as she does not have a ride. She has an appt with B. Stader, PA on 6/18. She will wait results of labs to see if MD would like her to come in sooner than 6/18.

## 2016-06-24 NOTE — Telephone Encounter (Signed)
LMTCB

## 2016-06-24 NOTE — Telephone Encounter (Signed)
New message    Pt is calling for RN. She is asking for a call back.

## 2016-06-24 NOTE — Telephone Encounter (Signed)
New message    Pt is calling for call back from RN. She states she spoke with [facility] about instructions and she has some questions.

## 2016-06-25 ENCOUNTER — Other Ambulatory Visit: Payer: Managed Care, Other (non HMO)

## 2016-06-25 NOTE — Telephone Encounter (Signed)
Patient here for lab work, bp 160/60. She feels okay but does have a small amount of swelling in her ankles. She was told to continue her medications as currently taking them until we call her. Patient voiced understanding  Will make dr hochrein aware.

## 2016-06-25 NOTE — Telephone Encounter (Addendum)
Spoke with pt, she wants to have her bp checked when she comes for lab work today. She reports no SOB or edema but her weight is up about 2 lbs. Will forward to [address] as she is coming there for lab work.

## 2016-06-26 ENCOUNTER — Emergency Department (HOSPITAL_COMMUNITY)
Admission: EM | Admit: 2016-06-26 | Discharge: 2016-06-26 | Disposition: A | Discharge status: Home / Self Care | Payer: Managed Care, Other (non HMO) | Attending: Emergency Medicine | Admitting: Emergency Medicine

## 2016-06-26 ENCOUNTER — Emergency Department (HOSPITAL_COMMUNITY): Payer: Managed Care, Other (non HMO)

## 2016-06-26 ENCOUNTER — Encounter (HOSPITAL_COMMUNITY): Payer: Self-pay

## 2016-06-26 ENCOUNTER — Telehealth (HOSPITAL_COMMUNITY): Payer: Self-pay | Admitting: Cardiology

## 2016-06-26 DIAGNOSIS — J441 Chronic obstructive pulmonary disease with (acute) exacerbation: Secondary | ICD-10-CM | POA: Diagnosis not present

## 2016-06-26 DIAGNOSIS — I2510 Atherosclerotic heart disease of native coronary artery without angina pectoris: Secondary | ICD-10-CM | POA: Insufficient documentation

## 2016-06-26 DIAGNOSIS — R1900 Intra-abdominal and pelvic swelling, mass and lump, unspecified site: Secondary | ICD-10-CM

## 2016-06-26 DIAGNOSIS — Z91040 Latex allergy status: Secondary | ICD-10-CM | POA: Diagnosis not present

## 2016-06-26 DIAGNOSIS — Z7982 Long term (current) use of aspirin: Secondary | ICD-10-CM | POA: Insufficient documentation

## 2016-06-26 DIAGNOSIS — R600 Localized edema: Secondary | ICD-10-CM | POA: Insufficient documentation

## 2016-06-26 DIAGNOSIS — I5033 Acute on chronic diastolic (congestive) heart failure: Secondary | ICD-10-CM | POA: Diagnosis not present

## 2016-06-26 DIAGNOSIS — R1909 Other intra-abdominal and pelvic swelling, mass and lump: Secondary | ICD-10-CM | POA: Diagnosis not present

## 2016-06-26 DIAGNOSIS — R0602 Shortness of breath: Secondary | ICD-10-CM | POA: Diagnosis present

## 2016-06-26 DIAGNOSIS — I110 Hypertensive heart disease with heart failure: Secondary | ICD-10-CM | POA: Diagnosis not present

## 2016-06-26 DIAGNOSIS — R05 Cough: Secondary | ICD-10-CM | POA: Insufficient documentation

## 2016-06-26 DIAGNOSIS — Z79899 Other long term (current) drug therapy: Secondary | ICD-10-CM | POA: Diagnosis not present

## 2016-06-26 DIAGNOSIS — R531 Weakness: Secondary | ICD-10-CM | POA: Insufficient documentation

## 2016-06-26 DIAGNOSIS — R059 Cough, unspecified: Secondary | ICD-10-CM

## 2016-06-26 LAB — CBC
HCT: 33.3 % — ABNORMAL LOW (ref 36.0–46.0)
Hemoglobin: 10.6 g/dL — ABNORMAL LOW (ref 12.0–15.0)
MCH: 29.8 pg (ref 26.0–34.0)
MCHC: 31.8 g/dL (ref 30.0–36.0)
MCV: 93.5 fL (ref 78.0–100.0)
PLATELETS: 283 10*3/uL (ref 150–400)
RBC: 3.56 MIL/uL — ABNORMAL LOW (ref 3.87–5.11)
RDW: 13.8 % (ref 11.5–15.5)
WBC: 10.9 10*3/uL — AB (ref 4.0–10.5)

## 2016-06-26 LAB — URINALYSIS, ROUTINE W REFLEX MICROSCOPIC
Bilirubin Urine: NEGATIVE
GLUCOSE, UA: NEGATIVE mg/dL
Hgb urine dipstick: NEGATIVE
KETONES UR: NEGATIVE mg/dL
Leukocytes, UA: NEGATIVE
Nitrite: NEGATIVE
PH: 5 (ref 5.0–8.0)
PROTEIN: NEGATIVE mg/dL
Specific Gravity, Urine: 1.02 (ref 1.005–1.030)

## 2016-06-26 LAB — BASIC METABOLIC PANEL
Anion gap: 9 (ref 5–15)
BUN: 14 mg/dL (ref 6–20)
CALCIUM: 9.2 mg/dL (ref 8.9–10.3)
CO2: 20 mmol/L — ABNORMAL LOW (ref 22–32)
CREATININE: 0.83 mg/dL (ref 0.44–1.00)
Chloride: 107 mmol/L (ref 101–111)
GFR calc Af Amer: >60 mL/min (ref >60)
GLUCOSE: 107 mg/dL — AB (ref 65–99)
Potassium: 4.1 mmol/L (ref 3.5–5.1)
SODIUM: 136 mmol/L (ref 135–145)

## 2016-06-26 LAB — I-STAT TROPONIN, ED
Troponin i, poc: 0 ng/mL (ref 0.00–0.08)
Troponin i, poc: 0 ng/mL (ref 0.00–0.08)

## 2016-06-26 LAB — BRAIN NATRIURETIC PEPTIDE: B NATRIURETIC PEPTIDE 5: 31.5 pg/mL (ref 0.0–100.0)

## 2016-06-26 MED ORDER — IOPAMIDOL (ISOVUE-300) INJECTION 61%
INTRAVENOUS | Status: AC
  Administered 2016-06-26: 100 mL
  Filled 2016-06-26: qty 100

## 2016-06-26 MED ORDER — OXYCODONE-ACETAMINOPHEN 5-325 MG PO TABS
1.0000 | ORAL_TABLET | Freq: Once | ORAL | Status: AC
  Administered 2016-06-26: 1 via ORAL
  Filled 2016-06-26: qty 1

## 2016-06-26 MED ORDER — FUROSEMIDE 10 MG/ML IJ SOLN
40.0000 mg | Freq: Once | INTRAMUSCULAR | Status: AC
  Administered 2016-06-26: 40 mg via INTRAVENOUS
  Filled 2016-06-26: qty 4

## 2016-06-26 MED ORDER — OXYCODONE-ACETAMINOPHEN 5-325 MG PO TABS: 1.0000 | ORAL_TABLET | Freq: Four times a day (QID) | ORAL | 0 refills | Status: AC | PRN

## 2016-06-26 NOTE — Telephone Encounter (Signed)
Patient called in to triage w/complaints of shortness of breath. Returned call to patient and she is currently in route to [facility] ED. Outreach to nurse first made to notify them of patient's impending arrival.

## 2016-06-26 NOTE — ED Notes (Signed)
ED Provider at bedside. 

## 2016-06-26 NOTE — Telephone Encounter (Signed)
Returned call to patient. She states she is on her way to [facility] ED for evaluation. Advised patient that I will call [place] and notify them of her impending arrival.   Called and notified nurse first at [facility] ED

## 2016-06-26 NOTE — ED Triage Notes (Signed)
Patient complains of 1 week of increased chest heaviness and shortness of breath with increased edema since seeing doctor last week and they told her to stop all her daily meds for her weakness. Speaking complete sentences on assessment.

## 2016-06-26 NOTE — Telephone Encounter (Signed)
New message   Pt c/o Shortness Of Breath: STAT if SOB developed within the last 24 hours or pt is noticeably SOB on the phone  1. Are you currently SOB (can you hear that pt is SOB on the phone)? yes  2. How long have you been experiencing SOB? Pt states Dr. Percival Spanish took her off of all of her meds and now she is having a very hard time breathing. She would like someone to call her back. She was advised that when she came in for labs that if she began having trouble breathing - to call our office.  3. Are you SOB when sitting or when up moving around? All the time  4. Are you currently experiencing any other symptoms? no  Tried to reach triage 3x no answer, sending message

## 2016-06-26 NOTE — ED Notes (Signed)
Pt returns from radiology pt cont. On monitor.

## 2016-06-26 NOTE — ED Provider Notes (Signed)
[facility] DEPT Provider Note   CSN: [id number] Arrival date & time: 06/26/16  0932     History   Chief Complaint No chief complaint on file.   HPI [name] is a 56 y.o. female.  HPI  56 y.o. female with a hx of Diastolic CHF, CAD, HTN, HLD, , presents to the Emergency Department today complaining of increased chest heaviness and shortness of breath x 1 week. Worse with exertion. Denies chest pain currently. No N/V. No diaphoresis. Pt states she was seen by both PCP and Cardiology in the past week for similar symptoms. Pt states she was told to stop CHF medications on Thursday. Normally on Torsemide 20mg  BID. Pt states swelling has gotten worse in BLE. Shortness of breath getting worse. No fevers. No URI symptoms. Does endorse abdominal pain on lower right side. No dysuria, but states she has odor with urine as well as dark urine color. No vaginal bleeding/discharge. No other symptoms noted.    Past Medical History:  Diagnosis Date  . Acute on chronic diastolic CHF (congestive heart failure) ([place]) 04/11/2014  . Acute suppurative otitis media without spontaneous rupture of eardrum 02/04/2007   Centricity Description: OTITIS MEDIA, SUPPURATIVE, ACUTE, BILATERAL Qualifier: Diagnosis of  By: [name] MD, [name]  Centricity Description: OTITIS MEDIA, SUPPURATIVE, ACUTE Qualifier: Diagnosis of  By: [name] MD, [name]   . Allergic rhinitis   . CAD (coronary artery disease)    LHC (11/29/1999):  EF 60%; no significant CAD.  [name] Carpal tunnel syndrome   . Carpal tunnel syndrome 06/09/2006   Qualifier: Diagnosis of  By: Marca Ancona RMA, Lucy    . Chronic diastolic CHF (congestive heart failure) ([place])    a. Echo (01/21/11):  Vigorous LVF, EF 65-70%, no RWMA, Gr 2 DD. ;  b.  Echo (12/15): Moderate LVH, EF 73-22%, grade 2 diastolic dysfunction, mild LAE, normal RV function  . Common migraine   . Diastolic heart failure ([place])   . Diverticulitis   . Hepatic steatosis   . Hepatomegaly   . Hiatal  hernia   . Hyperlipidemia   . Hypertension   . IBS (irritable bowel syndrome)   . Internal hemorrhoids   . Morbid obesity ([place])   . OSA (obstructive sleep apnea)    CPAP dependent  . PNEUMONIA, ORGAN UNSPECIFIED 03/01/2009   Qualifier: Diagnosis of  By: [name] CMA, [name]    . Sinusitis, chronic 03/10/2012   CT sinuses 02/2012:  Chronic sinusitis with small A/F levels >> rx by ENT with prolonged augmentin F/u ct sinuses 04/2012:  Persistent sinus thickening >> rx with levaquin and clinda for 30 days.  Told to return for sinus scan in 6 weeks >> no showed for followup visit.  CT sinuses 11/2013:  Acute and chronic sinusitis noted.      Patient Active Problem List   Diagnosis Date Noted  . Cough variant asthma 07/31/2015  . Upper airway cough syndrome 04/11/2015  . Severe obesity (BMI >= 40) ([place]) 04/11/2015  . Bronchitis, chronic obstructive w acute bronchitis ([place]) 04/11/2015  . Dyspnea   . Acute on chronic diastolic CHF (congestive heart failure), NYHA class 2 ([place]) 03/27/2015  . Morbid obesity due to excess calories ([place]) 03/27/2015  . Dysphagia 03/27/2015  . Hyponatremia 03/27/2015  . Acute kidney injury ([place]) 03/27/2015  . Hypokalemia 03/27/2015  . Leukocytosis 03/27/2015  . COPD exacerbation ([place]) 03/27/2015  . Sinusitis, chronic 03/10/2012  . Hyperlipidemia 10/02/2006  . Obstructive sleep apnea 06/09/2006  . Essential hypertension  06/09/2006    Past Surgical History:  Procedure Laterality Date  . CESAREAN SECTION    . NASAL SINUS SURGERY    . PARTIAL HYSTERECTOMY    . TONSILLECTOMY    . TUBAL LIGATION    . UVULOPALATOPHARYNGOPLASTY      OB History    No data available       Home Medications    Prior to Admission medications   Medication Sig Start Date End Date Taking? Authorizing Provider  albuterol (ACCUNEB) 1.25 MG/3ML nebulizer solution Take 1 ampule by nebulization every 6 (six) hours as needed for wheezing.    [provider]  aspirin 81 MG  EC tablet Take 1 tablet (81 mg total) by mouth daily. 07/03/15   Almyra Deforest, PA  benzonatate (TESSALON) 200 MG capsule Take 1 capsule (200 mg total) by mouth 3 (three) times daily as needed for cough. 05/21/16   Tanda Rockers, MD  BIDIL 20-37.5 MG tablet Take 1 tablet by mouth 3 (three) times daily. 09/11/15   [provider]  Blood Pressure Monitoring DEVI 1 each by Does not apply route every morning. 06/21/16   Minus Breeding, MD  budesonide-formoterol (SYMBICORT) 80-4.5 MCG/ACT inhaler Inhale 2 puffs into the lungs 2 (two) times daily as needed (For shortness of breath.).     [provider]  cefdinir (OMNICEF) 300 MG capsule Take 1 capsule (300 mg total) by mouth 2 (two) times daily. 05/24/16   Tanda Rockers, MD  chlorpheniramine (CHLOR-TRIMETON) 4 MG tablet Take 4 mg by mouth every 4 (four) hours as needed for allergies.     [provider]  dicyclomine (BENTYL) 10 MG capsule TAKE 1 CAPSULE(10 MG) BY MOUTH FOUR TIMES DAILY BEFORE MEALS AND AT BEDTIME 12/04/15   Irene Shipper, MD  diltiazem 2 % GEL Apply 1 application topically 2 (two) times daily. 05/01/16   Esterwood, Amy S, PA-C  famotidine (PEPCID) 20 MG tablet One at bedtime 12/07/15   Tanda Rockers, MD  fluticasone [facility]) 50 MCG/ACT nasal spray Place 2 sprays into both nostrils daily. 01/08/16   Parrett, Fonnie Mu, NP  losartan (COZAAR) 100 MG tablet Take 0.5 tablets (50 mg total) by mouth daily. 06/21/16   Minus Breeding, MD  meclizine (ANTIVERT) 25 MG tablet Take 1 tablet (25 mg total) by mouth 3 (three) times daily as needed for dizziness. 05/21/16   Tanda Rockers, MD  metolazone (ZAROXOLYN) 2.5 MG tablet TAKE 1 TABLET DAILY 30 MIN. BEFORE LASIX(TAKE ONLY IF WEIGHT INCREASE NOT IMPROVED WITH LASIX) Patient taking differently: TAKE 1 TABLET DAILY AS NEEDED FOR WEIGHT GAIN OR LEG SWELLING. 01/17/16   Minus Breeding, MD  montelukast (SINGULAIR) 10 MG tablet Take 1 tablet (10 mg total) by mouth at bedtime. 10/14/15    Juanito Doom, MD  pantoprazole (PROTONIX) 40 MG tablet TAKE 1 TABLET BY MOUTH DAILY, TAKE 30 TO 60 MINUTES BEFORE FIRST MEAL OF THE DAY 11/07/15   Tanda Rockers, MD  polyethylene glycol powder (GLYCOLAX/MIRALAX) powder Take 68 g by mouth daily.    [provider]  potassium chloride SA (K-DUR,KLOR-CON) 20 MEQ tablet TAKE 2 TABLET BY MOUTH THREE TIMES DAILY AND 1 TABLET EXTRA AS NEEDED 04/22/16   Minus Breeding, MD  Probiotic Product (PROBIOTIC PO) Take 1 tablet by mouth daily.    [provider]  spironolactone (ALDACTONE) 25 MG tablet Take 0.5 tablets (12.5 mg total) by mouth 2 (two) times daily. 10/10/15   Minus Breeding, MD  topiramate (  TOPAMAX) 25 MG tablet Take 25 mg by mouth 2 (two) times daily as needed (for headaches).  03/03/15   [provider]  torsemide (DEMADEX) 20 MG tablet Take 2 tablets (40 mg total) by mouth 2 (two) times daily. 08/25/15 05/21/16  Minus Breeding, MD  traMADol (ULTRAM) 50 MG tablet Take 1 tablet (50 mg total) by mouth every 6 (six) hours as needed for moderate pain. 05/21/16   Tanda Rockers, MD    Family History Family History  Problem Relation Age of Onset  . Heart disease Mother   . Heart attack Mother   . Hypertension Mother   . Coronary artery disease Unknown        1st degree relatvie <50  . Breast cancer Unknown        aunt- ? paternal or maternal  . Asthma Sister   . Stroke Neg Hx     Social History Social History  Substance Use Topics  . Smoking status: Never Smoker  . Smokeless tobacco: Never Used  . Alcohol use No     Allergies   Sulfonamide derivatives; Doxycycline; Latex; Ciprofloxacin; Fish allergy; Hydrocortisone; and Neomycin   Review of Systems Review of Systems ROS reviewed and all are negative for acute change except as noted in the HPI.  Physical Exam Updated Vital Signs BP 137/78 (BP Location: Left Arm)   Pulse 65   Temp 99 F (37.2 C) (Oral)   Resp 20   Ht 5\' 3"  (1.6 m)   Wt (!) 140.6  kg (310 lb)   SpO2 99%   BMI 54.91 kg/m   Physical Exam  Constitutional: She is oriented to person, place, and time. Vital signs are normal. She appears well-developed and well-nourished.  NAD  HENT:  Head: Normocephalic and atraumatic.  Right Ear: Hearing normal.  Left Ear: Hearing normal.  Eyes: Conjunctivae and EOM are normal. Pupils are equal, round, and reactive to light.  Neck: Normal range of motion. Neck supple.  Cardiovascular: Normal rate, regular rhythm, normal heart sounds and intact distal pulses.   Pulmonary/Chest: Effort normal. She has decreased breath sounds in the right upper field, the right lower field, the left upper field and the left lower field. She has no wheezes. She has no rhonchi. She has no rales.  Abdominal: Soft. Bowel sounds are normal. There is no tenderness. There is no rigidity, no rebound, no guarding, no CVA tenderness, no tenderness at McBurney's point and negative Murphy's sign.  Morbidly obese.   Musculoskeletal: Normal range of motion.  3+ pitting edema bilaterally to knee  Neurological: She is alert and oriented to person, place, and time.  Skin: Skin is warm and dry.  Psychiatric: She has a normal mood and affect. Her speech is normal and behavior is normal. Thought content normal.  Nursing note and vitals reviewed.  ED Treatments / Results  Labs (all labs ordered are listed, but only abnormal results are displayed) Labs Reviewed  BASIC METABOLIC PANEL - Abnormal; Notable for the following:       Result Value   CO2 20 (*)    Glucose, Bld 107 (*)    All other components within normal limits  CBC - Abnormal; Notable for the following:    WBC 10.9 (*)    RBC 3.56 (*)    Hemoglobin 10.6 (*)    HCT 33.3 (*)    All other components within normal limits  BRAIN NATRIURETIC PEPTIDE  URINALYSIS, ROUTINE W REFLEX MICROSCOPIC  I-STAT TROPOININ, ED  Randolm Idol, ED    EKG  EKG Interpretation  Date/Time:  Wednesday June 26 2016  09:35:54 EDT Ventricular Rate:  61 PR Interval:  170 QRS Duration: 86 QT Interval:  396 QTC Calculation: 398 R Axis:   101 Text Interpretation:  Normal sinus rhythm Rightward axis Low voltage QRS Cannot rule out Anterior infarct , age undetermined Abnormal ECG Confirmed by Charlesetta Shanks [phone]) on 06/26/2016 9:47:52 AM Also confirmed by Charlesetta Shanks [phone]), editor Drema Pry [phone])  on 06/26/2016 11:44:25 AM       Radiology Dg Chest 2 View  Result Date: 06/26/2016 CLINICAL DATA:  Chest pain and shortness of breath for several days. EXAM: CHEST  2 VIEW COMPARISON:  04/12/2016 FINDINGS: The heart size and mediastinal contours are within normal limits. Both lungs are clear. The visualized skeletal structures are unremarkable. IMPRESSION: Stable exam.  No active cardiopulmonary disease. Electronically Signed   By: Earle Gell M.D.   On: 06/26/2016 10:20   Ct Abdomen Pelvis W Contrast  Result Date: 06/26/2016 CLINICAL DATA:  Right flank pain over the last week with nausea and vomiting. EXAM: CT ABDOMEN AND PELVIS WITH CONTRAST TECHNIQUE: Multidetector CT imaging of the abdomen and pelvis was performed using the standard protocol following bolus administration of intravenous contrast. CONTRAST:  129mL ISOVUE-300 IOPAMIDOL (ISOVUE-300) INJECTION 61% COMPARISON:  12/06/2015 and multiple previous as distant as 2011. FINDINGS: Lower chest: No active disease in the lower chest. Small hiatal hernia. Prominent pleural fat. Hepatobiliary: No focal liver lesion. Tiny echogenic focus in the gallbladder that could be sludge or tiny stone. No CT evidence of cholecystitis. Pancreas: Normal Spleen: Normal Adrenals/Urinary Tract: Adrenal glands are normal except for a tiny adenoma on the left. Left kidney is normal. No cyst, mass, stone or hydronephrosis. Right renal parenchyma appears normal. There is mild fullness the right renal collecting system. There is a 10 x 5 mm calcification in the  retroperitoneum on the right at the level of the lower pole the right kidney. This has been present previously. I think this could be in the gonadal vein or could possibly be a chronic proximal ureteral stone. It very difficult to separate this out on the imaging due to the proximity of the ureter in the retroperitoneal veins. No stone in the bladder. Stomach/Bowel: Periumbilical hernia containing fat. No significant bowel finding. Vascular/Lymphatic: Aorta and IVC appear normal. No retroperitoneal mass or adenopathy. Reproductive: Previous hysterectomy.  No pelvic mass. Other: No free fluid or air. Musculoskeletal: Ordinary lower lumbar degenerative changes. IMPRESSION: Chronic 10 x 5 mm calcification in the right retroperitoneum at the level of the lower pole of the right kidney. Difficult to determine if this is in a vein or in the ureter. Previously, this was presumed to be venous. There is mild fullness the right renal collecting system which was not seen previously, elevating the possibility that this could be in the ureter. An additional possibility would be that even if this were in a vein, it is exerting mild extrinsic mass effect upon the ureter. No other finding to explain the presenting symptoms. Electronically Signed   By: Nelson Chimes M.D.   On: 06/26/2016 12:35    Procedures Procedures (including critical care time)  Medications Ordered in ED Medications  furosemide (LASIX) injection 40 mg (not administered)  iopamidol (ISOVUE-300) 61 % injection (not administered)   Initial Impression / Assessment and Plan / ED Course  I have reviewed the triage vital signs and the nursing notes.  Pertinent labs &  imaging results that were available during my care of the patient were reviewed by me and considered in my medical decision making (see chart for details).  Final Clinical Impressions(s) / ED Diagnoses  {I have reviewed and evaluated the relevant laboratory values. {I have reviewed and  evaluated the relevant imaging studies. {I have interpreted the relevant EKG. {I have reviewed the relevant previous healthcare records. {I have reviewed EMS Documentation. {I obtained HPI from historian. {Patient discussed with supervising physician.  ED Course:  Assessment: Pt is a 56 y.o. female with hx Diastolic CHF, CAD, HTN, HLD who presents with shortness of breath and weakness x 1 week. Seen by PCP and Cardiology for same. Pt was told to stop Torsemide on Thursday, but I see no records of this. No fevers. No N/V. No CP. No URI symptoms. Does endorse right sided abdominal pain. No vaginal bleeding/discharge. On exam, pt in NAD. Nontoxic/nonseptic appearing. VSS. Afebrile. Lungs CTA. Heart RRR. Abdomen nontender soft. 3+ pitting edema noted BLE to knees. CBC with mild leukocytosis. BMP unremarkable. Trop negative x2. BNP WNL. CXR unremarkable. CT abdomen/Pelvis showed 10x34mm calcification to right retroperitoneum, vein vs ureter. Could explain pt abdominal discomfort symptoms on right aspect. UA negative. Given 40mg  IV Lasix in ED. I have reviewed the [place] Controlled Substance Reporting System. Given Rx #10 Percocet. Plan is to DC home with follow up to PCP. Encouraged to take Lasix. At time of discharge, Patient is in no acute distress. Vital Signs are stable. Patient is able to ambulate. Patient able to tolerate PO.   Disposition/Plan:  DC Home Additional Verbal discharge instructions given and discussed with patient.  Pt Instructed to f/u with PCP in the next week for evaluation and treatment of symptoms. Return precautions given Pt acknowledges and agrees with plan  Supervising Physician Charlesetta Shanks, MD  Final diagnoses:  Bilateral lower extremity edema  Weakness  Cough  Retroperitoneal mass    New Prescriptions New Prescriptions   No medications on file       Shary Decamp, Hershal Coria 06/26/16 [place], [place], MD 07/01/16 [phone]

## 2016-06-26 NOTE — Discharge Instructions (Addendum)
Please read and follow all provided instructions.  Your diagnoses today include:  1. Bilateral lower extremity edema   2. Weakness   3. Cough   4. Retroperitoneal mass     Tests performed today include: Vital signs. See below for your results today.   CT Abdomen/Pelvis IMPRESSION: Chronic 10 x 5 mm calcification in the right retroperitoneum at the level of the lower pole of the right kidney. Difficult to determine if this is in a vein or in the ureter. Previously, this was presumed to be venous. There is mild fullness the right renal collecting system which was not seen previously, elevating the possibility that this could be in the ureter. An additional possibility would be that even if this were in a vein, it is exerting mild extrinsic mass effect upon the ureter.  Medications prescribed:  Take as prescribed. Continue withFluid pill   Home care instructions:  Follow any educational materials contained in this packet.  Follow-up instructions: Please follow-up with your primary care provider for further evaluation of symptoms and treatment   Return instructions:  Please return to the Emergency Department if you do not get better, if you get worse, or new symptoms OR  - Fever (temperature greater than 101.82F)  - Bleeding that does not stop with holding pressure to the area    -Severe pain (please note that you may be more sore the day after your accident)  - Chest Pain  - Difficulty breathing  - Severe nausea or vomiting  - Inability to tolerate food and liquids  - Passing out  - Skin becoming red around your wounds  - Change in mental status (confusion or lethargy)  - New numbness or weakness    Please return if you have any other emergent concerns.  Additional Information:  Your vital signs today were: BP (!) 114/53    Pulse 70    Temp 99 F (37.2 C) (Oral)    Resp 19    Ht 5\' 3"  (1.6 m)    Wt (!) 140.6 kg (310 lb)    SpO2 99%    BMI 54.91 kg/m  If your blood  pressure (BP) was elevated above 135/85 this visit, please have this repeated by your doctor within one month. ---------------

## 2016-06-27 ENCOUNTER — Encounter: Payer: Managed Care, Other (non HMO) | Admitting: Adult Health

## 2016-06-27 ENCOUNTER — Telehealth: Payer: Self-pay | Admitting: Cardiology

## 2016-06-27 ENCOUNTER — Ambulatory Visit: Payer: Managed Care, Other (non HMO) | Admitting: Physician Assistant

## 2016-06-27 ENCOUNTER — Telehealth: Payer: Self-pay | Admitting: Internal Medicine

## 2016-06-27 MED ORDER — BENZONATATE 200 MG PO CAPS: 200.0000 mg | ORAL_CAPSULE | Freq: Three times a day (TID) | ORAL | 3 refills | Status: DC | PRN

## 2016-06-27 NOTE — Telephone Encounter (Signed)
Spoke with pt, her weight is down from 312 to 303 lb and her swelling is gone. She has a cough that she was told was from the reflux from the fluid being drained off so quickly. She will take the protonix for that. She was given IV lasix in the ER and took 40 mg of torsemide last night and was told to f/u with cardiology today to find out how to take her medications from here. Will forward to dr hochrein.

## 2016-06-27 NOTE — Telephone Encounter (Signed)
Discussed with hao meng pa, the patient was instructed to restart torsemide as previously taking until seen 07-08-16. She is to call if her weight does not continue to go down.

## 2016-06-27 NOTE — Telephone Encounter (Signed)
That's fine - needs refills x 3

## 2016-06-27 NOTE — Telephone Encounter (Signed)
rx sent to preferred pharmacy.  Pt aware.  Nothing further needed.  

## 2016-06-27 NOTE — Telephone Encounter (Signed)
Spoke with pt. She is requesting a refill on Benzonatate. States that she is still having issues with chest tightness and rib pain from coughing. Pt has an upcoming appointment with TP for a med calendar.  MW - please advise. Thanks.  Patient Instructions    Please see patient coordinator before you leave today  to schedule sinus CT   See calendar for specific medication instructions and bring it back for each and every office visit for every healthcare provider you see.  Without it,  you may not receive the best quality medical care that we feel you deserve.  You will note that the calendar groups together  your maintenance  medications that are timed at particular times of the day.  Think of this as your checklist for what your doctor has instructed you to do until your next evaluation to see what benefit  there is  to staying on a consistent group of medications intended to keep you well.  The other group at the bottom is entirely up to you to use as you see fit  for specific symptoms that may arise between visits that require you to treat them on an as needed basis.  Think of this as your action plan or "what if" list.   Separating the top medications from the bottom group is fundamental to providing you adequate care going forward.     See [name] NP win 4 weeks with all your medications, even over the counter meds, separated in two separate bags, the ones you take no matter what vs the ones you stop once you feel better and take only as needed when you feel you need them.   [name]  will generate for you a new user friendly medication calendar that will put [place] all on the same page re: your medication use.     Without this process, it simply isn't possible to assure that we are providing  your outpatient care  with  the attention to detail we feel you deserve.   If we cannot assure that you're getting that kind of care,  then we cannot manage your problem effectively from this  clinic.  Once you have seen [name] and we are sure that we're all on the same page with your medication use she will arrange follow up with me.

## 2016-06-27 NOTE — Telephone Encounter (Signed)
New Message  Pt call requesting to speak with RN. Pt went to the ER for SOB. Pt states she was taken off her Fluid pill... Pt would like to discuss how she needs to take current medication. Pt also states she was to get lab results read to her. Please call back to discuss.

## 2016-06-28 ENCOUNTER — Telehealth: Payer: Self-pay | Admitting: Cardiology

## 2016-06-28 MED ORDER — BLOOD PRESSURE MONITOR/ARM DEVI: 1.0000 | Freq: Every day | 0 refills | Status: DC

## 2016-06-28 NOTE — Telephone Encounter (Signed)
Pt calling regarding wrong rx being sent to [facility] , she needed a BP cuff and was sent rx for diabetic needles, pt would like a call when done and to get lab results

## 2016-06-28 NOTE — Telephone Encounter (Signed)
Resent rx

## 2016-07-07 NOTE — Progress Notes (Signed)
Cardiology Office Note    Date:  07/08/2016   ID:  KRISTA GODSIL, DOB 17-Jun-1960, MRN [id number]  PCP:  Bartholome Bill, MD  Cardiologist: Previously Dr. Percival Spanish --> Switching to Dr. Oval Linsey  Chief Complaint  Patient presents with  . Follow-up    4 months    History of Present Illness:    [name] is a 56 y.o. female with past medical history of chronic diastolic CHF, HTN, HLD, OSA, and morbid obeseity who presents to the office today for 42-month follow-up.   She was last examined by Dr. Percival Spanish in 02/2016 and reported doing well from a cardiac perspective at that time. Reported intermittent episodes of dyspnea on exertion but no acute changes in her symptoms. Weight was stable at 303 lbs, therefore she was continued on her current dose of Torsemide 40mg  BID along with PRN Metolazone.   She developed acute dyspnea on 06/26/2016 and went to [facility] ED for further evaluation. Her Torsemide had been held since 06/21/2016 for elevated creatinine (these labs are not available for review). Labs at the time of her ER visit showed a BNP of 31, creatinine 0.83, with initial and delta troponin values being negative. CXR showed no active cardiopulmonary disease. Abdominal CT showed a chronic 10 x 5 mm calcification in the right retroperitoneum at the level of the lower pole of the right kidney. She was given IV Lasix and informed to follow-up with Cardiology and her PCP for further evaluation.  In talking with the patient today, she reports doing well since her recent ER visit. She was evaluated by Nephrology and restarted on Torsemide 40 mg twice a day. Since resuming this, her weight has decreased from 313 lbs to 303 lbs which is close to her baseline. She reports significant improvement in her orthopnea and lower extremity edema. She denies any recent chest discomfort, palpitations, dyspnea on exertion, or presyncope.   She was previously on BiDil but is unable to afford this  secondary to her co-pay of over $200 per month. She has been switched to Hydralazine 37.5mg  TID and Isordil Dinitrate 20mg  TID. She reports mild dizziness after taking her afternoon dose but this resolves within several minutes.    Past Medical History:  Diagnosis Date  . Acute on chronic diastolic CHF (congestive heart failure) ([place]) 04/11/2014  . Acute suppurative otitis media without spontaneous rupture of eardrum 02/04/2007   Centricity Description: OTITIS MEDIA, SUPPURATIVE, ACUTE, BILATERAL Qualifier: Diagnosis of  By: [name] MD, [name]  Centricity Description: OTITIS MEDIA, SUPPURATIVE, ACUTE Qualifier: Diagnosis of  By: [name] MD, [name]   . Allergic rhinitis   . CAD (coronary artery disease)    LHC (11/29/1999):  EF 60%; no significant CAD.  [name] Carpal tunnel syndrome   . Carpal tunnel syndrome 06/09/2006   Qualifier: Diagnosis of  By: Marca Ancona RMA, Lucy    . Chronic diastolic CHF (congestive heart failure) ([place])    a. Echo (01/21/11): Vigorous LVF, EF 65-70%, no RWMA, Gr 2 DD. b.  Echo (12/15): Moderate LVH, EF 27-03%, grade 2 diastolic dysfunction, mild LAE, normal RV function c. 01/2015: echo showing EF of 60-65% with mild LVH  . Common migraine   . Diastolic heart failure ([place])   . Diverticulitis   . Hepatic steatosis   . Hepatomegaly   . Hiatal hernia   . Hyperlipidemia   . Hypertension   . IBS (irritable bowel syndrome)   . Internal hemorrhoids   . Morbid obesity ([place])   .  OSA (obstructive sleep apnea)    CPAP dependent  . PNEUMONIA, ORGAN UNSPECIFIED 03/01/2009   Qualifier: Diagnosis of  By: [name] CMA, [name]    . Sinusitis, chronic 03/10/2012   CT sinuses 02/2012:  Chronic sinusitis with small A/F levels >> rx by ENT with prolonged augmentin F/u ct sinuses 04/2012:  Persistent sinus thickening >> rx with levaquin and clinda for 30 days.  Told to return for sinus scan in 6 weeks >> no showed for followup visit.  CT sinuses 11/2013:  Acute and chronic sinusitis noted.       Past Surgical History:  Procedure Laterality Date  . CESAREAN SECTION    . NASAL SINUS SURGERY    . PARTIAL HYSTERECTOMY    . TONSILLECTOMY    . TUBAL LIGATION    . UVULOPALATOPHARYNGOPLASTY      Current Medications: Outpatient Medications Prior to Visit  Medication Sig Dispense Refill  . albuterol (ACCUNEB) 1.25 MG/3ML nebulizer solution Take 1 ampule by nebulization every 6 (six) hours as needed for wheezing.    . benzonatate (TESSALON) 200 MG capsule Take 1 capsule (200 mg total) by mouth 3 (three) times daily as needed for cough. 30 capsule 3  . Blood Pressure Monitoring (BLOOD PRESSURE MONITOR/ARM) DEVI 1 each by Does not apply route daily. 1 Device 0  . chlorpheniramine (CHLOR-TRIMETON) 4 MG tablet Take 4 mg by mouth every 4 (four) hours as needed for allergies.     [name] dicyclomine (BENTYL) 10 MG capsule TAKE 1 CAPSULE(10 MG) BY MOUTH FOUR TIMES DAILY BEFORE MEALS AND AT BEDTIME 90 capsule 0  . diltiazem 2 % GEL Apply 1 application topically 2 (two) times daily. 1 Package 3  . famotidine (PEPCID) 20 MG tablet One at bedtime (Patient taking differently: Take 20 mg by mouth at bedtime. One at bedtime) 90 tablet 1  . fluticasone (FLONASE) 50 MCG/ACT nasal spray Place 2 sprays into both nostrils daily. (Patient taking differently: Place 2 sprays into both nostrils daily as needed for allergies. ) 48 g 1  . loratadine (CLARITIN) 10 MG tablet Take 10 mg by mouth daily.    [name] losartan (COZAAR) 100 MG tablet Take 0.5 tablets (50 mg total) by mouth daily. 90 tablet 2  . meclizine (ANTIVERT) 25 MG tablet Take 1 tablet (25 mg total) by mouth 3 (three) times daily as needed for dizziness. 30 tablet 11  . metolazone (ZAROXOLYN) 2.5 MG tablet TAKE 1 TABLET DAILY 30 MIN. BEFORE LASIX(TAKE ONLY IF WEIGHT INCREASE NOT IMPROVED WITH LASIX) 90 tablet 1  . montelukast (SINGULAIR) 10 MG tablet Take 1 tablet (10 mg total) by mouth at bedtime. 90 tablet 2  . pantoprazole (PROTONIX) 40 MG tablet TAKE 1  TABLET BY MOUTH DAILY, TAKE 30 TO 60 MINUTES BEFORE FIRST MEAL OF THE DAY 90 tablet 3  . polyethylene glycol powder (GLYCOLAX/MIRALAX) powder Take 68 g by mouth daily.    . potassium chloride SA (K-DUR,KLOR-CON) 20 MEQ tablet TAKE 2 TABLET BY MOUTH THREE TIMES DAILY AND 1 TABLET EXTRA AS NEEDED 217 tablet 1  . spironolactone (ALDACTONE) 25 MG tablet Take 0.5 tablets (12.5 mg total) by mouth 2 (two) times daily. 90 tablet 3  . topiramate (TOPAMAX) 25 MG tablet Take 25 mg by mouth 2 (two) times daily as needed (for headaches).     . traMADol (ULTRAM) 50 MG tablet Take 1 tablet (50 mg total) by mouth every 6 (six) hours as needed for moderate pain. 40 tablet 0  . aspirin 81  MG EC tablet Take 1 tablet (81 mg total) by mouth daily. 90 tablet 3  . BIDIL 20-37.5 MG tablet Take 1 tablet by mouth 3 (three) times daily.    [name] torsemide (DEMADEX) 20 MG tablet Take 2 tablets (40 mg total) by mouth 2 (two) times daily. 120 tablet 9  . budesonide-formoterol (SYMBICORT) 80-4.5 MCG/ACT inhaler Inhale 2 puffs into the lungs 2 (two) times daily as needed (For shortness of breath.).      No facility-administered medications prior to visit.      Allergies:   Sulfonamide derivatives; Doxycycline; Latex; Ciprofloxacin; Fish allergy; Hydrocortisone; and Neomycin   Social History   Social History  . Marital status: Widowed    Spouse name: N/A  . Number of children: 2  . Years of education: N/A   Occupational History  . Retired    Social History Main Topics  . Smoking status: Never Smoker  . Smokeless tobacco: Never Used  . Alcohol use No  . Drug use: No  . Sexual activity: Not Asked   Other Topics Concern  . None   Social History Narrative   Charity fundraiser, Oceanographer.   Widowed, lives with son.     Family History:  The patient's family history includes Asthma in her sister; Heart attack in her mother; Heart disease in her mother; Hypertension in her mother.   Review of Systems:    Please see the history of present illness.     General:  No chills, fever, night sweats or weight changes.  Cardiovascular:  No chest pain, dyspnea on exertion, edema, orthopnea, palpitations, paroxysmal nocturnal dyspnea. Dermatological: No rash, lesions/masses Respiratory: No cough, dyspnea Urologic: No hematuria, dysuria Abdominal:   No nausea, vomiting, diarrhea, bright red blood per rectum, melena, or hematemesis Neurologic:  No visual changes, wkns, changes in mental status. Positive for dizziness.   All other systems reviewed and are otherwise negative except as noted above.   Physical Exam:    VS:  BP 136/89   Pulse 90   Ht 5\' 3"  (1.6 m)   Wt (!) 303 lb 12.8 oz (137.8 kg)   BMI 53.82 kg/m    General: Well developed, morbidly obese African American female appearing in no acute distress. Head: Normocephalic, atraumatic, sclera non-icteric, no xanthomas, nares are without discharge.  Neck: No carotid bruits. JVD is difficult to assess secondary to body habitus. Lungs: Respirations regular and unlabored, without wheezes or rales.  Heart: Regular rate and rhythm. No S3 or S4.  No murmur, no rubs, or gallops appreciated. Abdomen: Soft, non-tender, non-distended with normoactive bowel sounds. No hepatomegaly. No rebound/guarding. No obvious abdominal masses. Msk:  Strength and tone appear normal for age. No joint deformities or effusions. Extremities: No clubbing or cyanosis. No lower extremity edema.  Distal pedal pulses are 2+ bilaterally. Neuro: Alert and oriented X 3. Moves all extremities spontaneously. No focal deficits noted. Psych:  Responds to questions appropriately with a normal affect. Skin: No rashes or lesions noted  Wt Readings from Last 3 Encounters:  07/08/16 (!) 303 lb 12.8 oz (137.8 kg)  06/26/16 (!) 310 lb (140.6 kg)  05/21/16 298 lb 12.8 oz (135.5 kg)     Studies/Labs Reviewed:   EKG:  EKG is not ordered today. EKG from 06/26/2016 is reviewed which shows  NSR, HR 61, with no acute ST or T-wave changes.   Recent Labs: 04/12/2016: ALT 20; Magnesium 1.9 05/01/2016: TSH 1.05 06/26/2016: B Natriuretic Peptide 31.5; BUN 14; Creatinine, Ser 0.83; Hemoglobin  10.6; Platelets 283; Potassium 4.1; Sodium 136   Lipid Panel    Component Value Date/Time   CHOL 169 02/20/2015 0440   TRIG 310 (H) 02/20/2015 0440   HDL 35 (L) 02/20/2015 0440   CHOLHDL 4.8 02/20/2015 0440   VLDL 62 (H) 02/20/2015 0440   LDLCALC 72 02/20/2015 0440    Additional studies/ records that were reviewed today include:   Echocardiogram: 03/2015 Study Conclusions  - Left ventricle: The cavity size was normal. Doppler parameters   are consistent with abnormal left ventricular relaxation (grade 1   diastolic dysfunction). - Impressions: Extremely poor acoustic windows limit study.   With echocontrast LVEF and RVEF appear to be normal. Cannot fully   evaluate all regional wall segments.  Impressions: - Extremely poor acoustic windows limit study.   With echocontrast LVEF and RVEF appear to be normal. Cannot fully   evaluate all regional wall segments.   Echocardiogram: 02/20/2015 Study Conclusions  - Left ventricle: The cavity size was normal. Wall thickness was   increased in a pattern of mild LVH. Systolic function was normal.   The estimated ejection fraction was in the range of 60% to 65%.  Assessment:    1. Chronic diastolic CHF (congestive heart failure), NYHA class 2 ([place])   2. Medication management   3. Essential hypertension   4. Obstructive sleep apnea   5. CKD (chronic kidney disease) stage 3, GFR 30-59 ml/min      Plan:   In order of problems listed above:  1. Chronic Diastolic CHF - echo in 57/8469 showed a preserved EF of 60-65% with mild LVH. Repeat in 03/2015 showed a normal EF with Grade 1 DD (study was technically difficult). - she has been restarted on Torsemide 40 mg BID by her Nephrologist. She does not appear volume overloaded by  physical examination and weight has remained stable at 303 lbs.  - sodium and fluid restriction reviewed with the patient. Continue current medication regimen with plans to recheck a BMET next week.  2. HTN - BP at 136/89 during today's visit. - continue Hydralazine/Isordil 37.5-20mg  TID (separate medications as she is unable to afford Bi-Dil), Losartan 100mg  daily, and Spironolactone 12.5mg  BID.   3. OSA - continue CPAP.   4. Stage 3 CKD - creatinine stable at 0.83 when last checked on 06/26/2016. - recheck BMET next week. Will forward results to her Nephrologist, Dr. Hollie Salk.   5. Morbid Obesity - BMI at 53.82. - reports difficulty with exercise due to significant joint pain. Healthy food options were reviewed with the patient.   Medication Adjustments/Labs and Tests Ordered: Current medicines are reviewed at length with the patient today.  Concerns regarding medicines are outlined above.  Medication changes, Labs and Tests ordered today are listed in the Patient Instructions below. Patient Instructions  Medication Instructions:  Your physician recommends that you continue on your current medications as directed. Please refer to the Current Medication list given to you today.  Labwork: Please return for lab work in [place] ([facility])-you do not have to be fasting.  Lab is closed 12:45-1:45.  Follow-Up: Your physician recommends that you schedule a follow-up appointment in: 3 months with Dr. Oval Linsey.  Any Other Special Instructions Will Be Listed Below (If Applicable).  If you need a refill on your cardiac medications before your next appointment, please call your pharmacy.  Signed, Erma Heritage, PA-C  07/08/2016 4:00 PM    [facility] [place], [place],  [place]  [address] Phone: [phone]; Fax: [phone]  [address], [place], [place] [address] Phone: [phone]

## 2016-07-08 ENCOUNTER — Encounter: Payer: Self-pay | Admitting: Student

## 2016-07-08 ENCOUNTER — Other Ambulatory Visit: Payer: Self-pay | Admitting: Nephrology

## 2016-07-08 ENCOUNTER — Other Ambulatory Visit: Payer: Self-pay | Admitting: Physician Assistant

## 2016-07-08 ENCOUNTER — Ambulatory Visit (INDEPENDENT_AMBULATORY_CARE_PROVIDER_SITE_OTHER): Payer: Managed Care, Other (non HMO) | Admitting: Student

## 2016-07-08 VITALS — BP 136/89 | HR 90 | Ht 63.0 in | Wt 303.8 lb

## 2016-07-08 DIAGNOSIS — Z79899 Other long term (current) drug therapy: Secondary | ICD-10-CM

## 2016-07-08 DIAGNOSIS — I5032 Chronic diastolic (congestive) heart failure: Secondary | ICD-10-CM

## 2016-07-08 DIAGNOSIS — G4733 Obstructive sleep apnea (adult) (pediatric): Secondary | ICD-10-CM

## 2016-07-08 DIAGNOSIS — I10 Essential (primary) hypertension: Secondary | ICD-10-CM

## 2016-07-08 DIAGNOSIS — N183 Chronic kidney disease, stage 3 unspecified: Secondary | ICD-10-CM | POA: Insufficient documentation

## 2016-07-08 MED ORDER — ISOSORBIDE DINITRATE 20 MG PO TABS: 20.0000 mg | ORAL_TABLET | Freq: Three times a day (TID) | ORAL | 3 refills | Status: DC

## 2016-07-08 MED ORDER — HYDRALAZINE HCL 25 MG PO TABS: 37.5000 mg | ORAL_TABLET | Freq: Three times a day (TID) | ORAL | 3 refills | Status: DC

## 2016-07-08 NOTE — Telephone Encounter (Signed)
Please review for refill, Thanks !  

## 2016-07-08 NOTE — Patient Instructions (Signed)
Medication Instructions:  Your physician recommends that you continue on your current medications as directed. Please refer to the Current Medication list given to you today.  Labwork: Please return for lab work in [place] ([facility])-you do not have to be fasting.  Lab is closed 12:45-1:45.   Follow-Up: Your physician recommends that you schedule a follow-up appointment in: 3 months with Dr. Oval Linsey.   Any Other Special Instructions Will Be Listed Below (If Applicable).     If you need a refill on your cardiac medications before your next appointment, please call your pharmacy.

## 2016-07-08 NOTE — Telephone Encounter (Signed)
NL

## 2016-07-09 ENCOUNTER — Other Ambulatory Visit: Payer: Self-pay | Admitting: Nephrology

## 2016-07-09 DIAGNOSIS — I129 Hypertensive chronic kidney disease with stage 1 through stage 4 chronic kidney disease, or unspecified chronic kidney disease: Secondary | ICD-10-CM

## 2016-07-09 DIAGNOSIS — E876 Hypokalemia: Secondary | ICD-10-CM

## 2016-07-09 DIAGNOSIS — E278 Other specified disorders of adrenal gland: Secondary | ICD-10-CM

## 2016-07-09 DIAGNOSIS — N201 Calculus of ureter: Secondary | ICD-10-CM

## 2016-07-12 ENCOUNTER — Telehealth: Payer: Self-pay | Admitting: Internal Medicine

## 2016-07-12 NOTE — Telephone Encounter (Signed)
Let pt know that she should take any medications that she has to take as early in the am of her colon with a small sip of water.

## 2016-07-16 ENCOUNTER — Telehealth: Payer: Self-pay

## 2016-07-16 ENCOUNTER — Telehealth: Payer: Self-pay | Admitting: Internal Medicine

## 2016-07-16 ENCOUNTER — Encounter (HOSPITAL_COMMUNITY): Payer: Self-pay | Admitting: *Deleted

## 2016-07-16 NOTE — Telephone Encounter (Signed)
Dr. Henrene Pastor I stated that she was taking protonix BID but she is taking it daily, sorry Could she try taking it BID?

## 2016-07-16 NOTE — Telephone Encounter (Signed)
Pt states she thinks she is having increased problems with her reflux. She is having increased burning in her stomach and states that when she is eating she coughs up some of the food and some phlegm. Pt is currently taking protonix 40mg  BID. She is scheduled for colon at hospital with Dr. Henrene Pastor on Thursday. Please advise.

## 2016-07-16 NOTE — Telephone Encounter (Signed)
Spoke with patient. She states that she has been throwing up clear phlegm since last Saturday. She is not able to eat because as soon as she eats, she starts throwing up again. After each episode, she has increased SOB. Denied any symptoms of CP or fever. Her stomach does not hurt as well. She has not taken any OTC medications.    MW, please advise. Patient wishes to use [facility] on [address]. Thanks!

## 2016-07-16 NOTE — Telephone Encounter (Signed)
Patient called very upset with the response to her triage call from Dr. Henrene Pastor. She feels that her problems with vomiting and reflux were minimized by Dr. Henrene Pastor because she is overweight and that he does not want to offer any alternative treatments because of it.  She states she is well aware that she is overweight and is working hard to lose weight, she has recently lost 25 lbs.   She feels that she was not offered any additional recommendations except "lose weight" when she has a current problem.  She went on to say that the delivery of the instructions to lose weight should come with additional advice. She asks that I cancel the appt with Dr. Henrene Pastor she has for a colonoscopy at [facility] on 07/18/16.  Central scheduling is closed for the evening, I will cancel the procedure in the morning.  She would like to change MDs in the practice.  She requests to see Dr. Silverio Decamp. Dr. Henrene Pastor and Dr. Silverio Decamp do you approve of transfer?

## 2016-07-16 NOTE — Telephone Encounter (Signed)
lmtcb x1 for pt. 

## 2016-07-16 NOTE — Telephone Encounter (Signed)
She is a month over due for f/u which would have included accurate and meaningful med reconciliation which may well have addressed this problem earlier  but bottom line is I can't treat her over the phone and would need with all meds in hand to sort out what she needs so if can't make it until ov > PCP/ UC or ER will have to see her in interim

## 2016-07-16 NOTE — Telephone Encounter (Signed)
Spoke with pt and she is aware, states she has lost 25 pounds. States she will call back tomorrow and let us know if she will proceed with colonoscopy. States she does not fell that great.

## 2016-07-16 NOTE — Telephone Encounter (Signed)
At this point the only thing that she can do for her reflux in addition to taking her medication is significant weight loss. Keep plans for colonoscopy

## 2016-07-16 NOTE — Telephone Encounter (Signed)
Yes and weight loss

## 2016-07-17 ENCOUNTER — Encounter: Payer: Self-pay | Admitting: Internal Medicine

## 2016-07-17 ENCOUNTER — Ambulatory Visit (INDEPENDENT_AMBULATORY_CARE_PROVIDER_SITE_OTHER): Payer: Managed Care, Other (non HMO) | Admitting: Internal Medicine

## 2016-07-17 VITALS — BP 104/60 | HR 97 | Ht 63.0 in | Wt 299.2 lb

## 2016-07-17 DIAGNOSIS — J45991 Cough variant asthma: Secondary | ICD-10-CM

## 2016-07-17 DIAGNOSIS — R058 Other specified cough: Secondary | ICD-10-CM

## 2016-07-17 DIAGNOSIS — R05 Cough: Secondary | ICD-10-CM | POA: Diagnosis not present

## 2016-07-17 NOTE — Assessment & Plan Note (Signed)
Body mass index is 53 kg/m.  -  trending up still  Lab Results  Component Value Date   TSH 1.05 05/01/2016     Contributing to gerd risk/ doe/reviewed the need and the process to achieve and maintain neg calorie balance > defer f/u primary care including intermittently monitoring thyroid status

## 2016-07-17 NOTE — Telephone Encounter (Signed)
Patient contacted again about the change approval. She will come in and see Tye Savoy RNP on 07/26/16 at 11:00.  She is offered earlier appts, but has multiple other appts and is not available until 07/26/16

## 2016-07-17 NOTE — Assessment & Plan Note (Signed)
07/31/2015  After extensive coaching HFA effectiveness =    25% > try symbicort 80 2bid and add spacer  - pt off symbicort 05/21/2016 x one month s any discernible airflow obst on spirometry > leave off   Flare of cough with perfectly clear lungs 07/17/2016 s any saba on day of ov > no steroids or inhalers needed at this point, the latter simply a trigger for more cough, the former for more wt gain   see avs for instructions unique to this ov

## 2016-07-17 NOTE — Assessment & Plan Note (Addendum)
NO  06/01/2015   =  8 - Spirometry 06/01/2015  No obstruction/ poor effort/ reproducibility  Allergy profile 06/01/2015 >  Eos 0.4 /  IgE  35 neg RAST - trial of singulair 06/01/2015 >>> - Sinus CT 06/07/2015 > No definite sinusitis. Only a minimal amount of debris is noted within the right partition of the sphenoid sinus. 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     Each maintenance medication was reviewed in detail including most importantly the difference between maintenance and as needed and under what circumstances the prns are to be used. This was done in the context of a medication calendar review which provided the patient with a user-friendly unambiguous mechanism for medication administration and reconciliation and provides an action plan for all active problems. It is critical that this be shown to every doctor  for modification during the office visit if necessary so the patient can use it as a working document.      F/u as planned with with all meds in hand using a trust but verify approach to confirm accurate Medication  Reconciliation The principal here is that until we are certain that the  patients are doing what we've asked, it makes no sense to ask them to do more.

## 2016-07-17 NOTE — Telephone Encounter (Signed)
Ok, please schedule follow up appt

## 2016-07-17 NOTE — Progress Notes (Signed)
Subjective:   Patient ID: ROMAYNE TICAS, female    DOB: 17-Jun-1960   MRN: [id number]  Brief patient profile:  11 yobf never smoker  with known hx of Chronic cough, sinusitis and ? Asthma with recurrent severe cough since her 40's "every few months"    History of Present Illness  06/10/2013 ER follow up  Was seen in ER on 5/15 for bronchitis , tx w/ Zpack and pred taper. CXR w/ no sign of acute changes, noted diffuse interstitial markings. Says got only minimally improved.  Complains of prod cough with green mucus, wheezing, dyspnea, tightness.  finished zpak and pred pak yesterday.  would like to discuss beginning nebs, says RT in ER said she should be on this at home.  Was started on QVAR last ov but not taking .   rec Augmentin 875mg  Twice daily  For 7 days  Mucinex DM Twice daily  As needed  Cough/congestion.  Fluids and rest   Begin Symbicort 80/4.30mcg 2 puffs Twice daily  - rinse after use.      09/17/2013  Acute extended  ov/Ahniya Mitchum re: asthma vs uacs Chief Complaint  Patient presents with  . Acute Visit    Pt c/o increased SOB, wheezing and prod cough with large amounts of white sputum x 1 wk.   Sinus problems by Lucia Gaskins x 2012  years and problems with cough/wheezing x a decade. Cough is worse p lie down and try to put on cpap  Much worse x one week but was much better p last ov and when I saw her in 10/2012 and rx as rhinitis/ gerd, not asthma. She has been "maintaining " on an empty symbicort and qvar via spacer. Really only sob at rest when coughing  rec Augmentin 875 mg twice daily x 10 days Prednisone 10 mg take  4 each am x 2 days,   2 each am x 2 days,  1 each am x 2 days and stop  Pantoprazole (protonix) 40 mg   Take 30-60 min before first meal of the day and Pepcid 20 mg one bedtime until return to office - this is the best way to tell whether stomach acid is contributing to your problem.   Take mucinex dm 2  every 12 hours and supplement if needed with  tramadol 50 mg  up to 2 every 4 hours  GERD diet   Symbicort 80 via spacer 80 Take 2 puffs first thing in am and then another 2 puffs about 12 hours later via spacer sample    04/07/2015 acute extended ov/Arin Vanosdol re: chronic cough / min hemoptysis  Chief Complaint  Patient presents with  . Acute Visit    Pt c/o with blood streaked sputum- started this am. She has some nasal congestion.   cough recurrent since Jan 2017 mostly dry / hacking day > noct assoc with nasal congestion but no epistaxis or excess/ purulent sputum or mucus plugs   rec Augmentin 875 mg take one pill twice daily  X 10 days -  Prednisone 10 mg take  4 each am x 2 days,   2 each am x 2 days,  1 each am x 2 days and stop  Pantoprazole (protonix) 40 mg   Take 30-60 min before first meal of the day and Pepcid 20 mg one bedtime until return to office  Take mucinex dm 2  every 12 hours and supplement if needed with  tramadol 50 mg up to 2 every 4 hours  to suppress the urge to cough. GERD diet   06/01/2015  Acute extended ov/Cherylee Rawlinson re: recurrent cough maint on acid suppression/ diet  Chief Complaint  Patient presents with  . Acute Visit    Pt c/o waking up in the night "throwing up mucus" x 5 days. She also c/o wheezing- using neb 2 x daily on average.   did get a lot  better p last ov, no need for albuterol neb then worse x 2 weeks prior to OV   Cough is now noct/ worse with CPAP  rec schedule sinus CT> min debris sphenoid sinus  Please remember to go to the lab   department  Prednisone 10 mg take  4 each am x 2 days,   2 each am x 2 days,  1 each am x 2 days and stop  Continue Pantoprazole (protonix) 40 mg   Take 30-60 min before first meal of the day and Pepcid 20 mg one bedtime until return   Add montelukast 10 mg at bedtime     07/31/2015  f/u ov/Blondell Laperle re: chronic cough ? transiently better p prednisone x 2 weeks / off symbicort / on singulair Chief Complaint  Patient presents with  . Follow-up    Increased cough and SOB x 2 wks.  Cough is prod with yellow to white sputum "feels like there is something in my throat".  She is using albuterol neb 2 x daily on average.   cough onset was Jan 2017 / ? Only really better until tramadol runs out?   rec Plan A = Automatic =  symbicort 80 Take 2 puffs first thing in am and then another 2 puffs about 12 hours later - add the spacer if needed - stop it if the cough comes back again while on symbicort Prednisone 10 mg take  4 each am x 2 days,   2 each am x 2 days,  1 each am x 2 days and stop  Plan B = Backup Only use your albuterol as a rescue medication  For drainage / throat tickle try take CHLORPHENIRAMINE  4 mg      05/21/2016  Acute exteneded  ov/Miracle Mongillo re:  Refractory cough since Jan 2017 : uacs/ very little evidence for asthma  Chief Complaint  Patient presents with  . Acute Visit    Increased cough with light yellow sputum x 6 days. She is also having increased SOB and wheezing.   only really better on tramadol/ non adherent with symb/ using alb qid but did not disclose it previously Cough to point of gag/ vomit at hs worse sob/coughing fits x one week  Did not bring meds or med calendar as instructed  rec schedule sinus CT > neg 05/24/16  See calendar for specific medication instructions and bring it back for each and every office visit  > did not do See [name] NP win 4 weeks with all your medication> did not do    07/17/2016  Acute  ov/Kyler Lerette re:  Uacs/ pnds with neg ct/ neg allergy w/u  Chief Complaint  Patient presents with  . Acute Visit    Pt c/o sinus pressure, HA and cough with clear sputum x 5 days. She is using her neb 2 x daily on average.    has med calendar and one bag of meds. Med calendar not updated with changes/ not following acitons plans  Not limited by breathing from desired activities but extremely sedentary  Having trouble using bipap due to "drainage"  but not taking hs h1 as rec  Cough no better with tessalon Ha generalized and worse as day goes  on, no nasal obst symptoms    No obvious day to day or daytime variability or assoc urulent sputum or mucus plugs or hemoptysis or cp or chest tightness, subjective wheeze or overt  hb symptoms. No unusual exp hx or h/o childhood pna/ asthma or knowledge of premature birth.  Also denies any obvious fluctuation of symptoms with weather or environmental changes or other aggravating or alleviating factors except as outlined above   Current Medications, Allergies, Complete Past Medical History, Past Surgical History, Family History, and Social History were reviewed in [employer] record.  ROS  The following are not active complaints unless bolded sore throat, dysphagia, dental problems, itching, sneezing,  nasal congestion or excess/ purulent secretions, ear ache,   fever, chills, sweats, unintended wt loss, classically pleuritic or exertional cp,  orthopnea pnd or leg swelling, presyncope, palpitations, abdominal pain, anorexia, nausea, vomiting, diarrhea  or change in bowel or bladder habits, change in stools or urine, dysuria,hematuria,  rash, arthralgias, visual complaints, headache, numbness, weakness or ataxia or problems with walking or coordination,  change in mood/affect or memory.            Objective:   Physical Exam  amb massively obese bf nad   06/01/2015        294  > 07/31/2015   295 >   05/21/2016   298 > 07/17/2016  299  04/07/2015        297   09/17/13 303 lb 12.8 oz (137.803 kg)  06/25/13 307 lb (139.254 kg)  06/10/13 313 lb 3.2 oz (142.067 kg)   Vital signs reviewed   - Note on arrival 02 sats  96% on RA      HEENT:  [place]/AT,  EACs-clear, TMs-wnl, NOSE-clear, THROAT-clear, no lesions, no postnasal drip or exudate noted.   NECK:  Supple w/ fair ROM; no JVD; normal carotid impulses w/o bruits; no thyromegaly or nodules palpated; no lymphadenopathy.    RESP  Absolutely  clear to A and P with no cough on insp/exp   CARD:  RRR, no m/r/g  ,  Min lower ext  bilateral sym  pitting edema/ pulses intact, no cyanosis or clubbing.  GI:   Soft & nt; nml bowel sounds; no organomegaly or masses detected.   Musco: Warm bil, no deformities or joint swelling noted.   Neuro: alert, no focal deficits noted.    Skin: Warm, no lesions or rashes                          Assessment & Plan:

## 2016-07-17 NOTE — Patient Instructions (Signed)
For drainage / throat tickle try take CHLORPHENIRAMINE  4 mg - take one every 4 hours as needed - available over the counter- may cause drowsiness so start with just a bedtime x two atumtomatically  and see how you tolerate it before trying in daytime    For thick mucus/ cough/congention > Mucinex dm 1200 mg every 12 hours as needed    See calendar for specific medication instructions and bring it back for each and every office visit for every healthcare provider you see.  Without it,  you may not receive the best quality medical care that we feel you deserve.    Keep appt with [name] but please separate your medicines into two bags as we discussed to verify you're taking your medications correctly before we go further

## 2016-07-17 NOTE — Telephone Encounter (Signed)
Called and spoke with pt and she is aware of MW recs.  She will come in here today to see MW.  Nothing further is needed.

## 2016-07-17 NOTE — Telephone Encounter (Signed)
I'm sorry she responded negatively. My telephone advice was in earnest regarding her poorly controlled reflux (maximal PPI and maximal reflux precautions). Unfortunately, and based on her response, I do not feel comfortable continuing to provide her GI care. She is welcome to see Dr. Silverio Decamp, if Dr. Silverio Decamp agrees. If not, she could consider GI care outside of this practice.

## 2016-07-18 ENCOUNTER — Ambulatory Visit (HOSPITAL_COMMUNITY)
Admission: RE | Admit: 2016-07-18 | Payer: Managed Care, Other (non HMO) | Source: Ambulatory Visit | Admitting: Internal Medicine

## 2016-07-18 SURGERY — COLONOSCOPY WITH PROPOFOL
Anesthesia: Monitor Anesthesia Care

## 2016-07-22 ENCOUNTER — Encounter: Payer: Managed Care, Other (non HMO) | Admitting: Adult Health

## 2016-07-23 ENCOUNTER — Ambulatory Visit
Admission: RE | Admit: 2016-07-23 | Discharge: 2016-07-23 | Disposition: A | Discharge status: Home / Self Care | Payer: Managed Care, Other (non HMO) | Source: Ambulatory Visit | Attending: Nephrology | Admitting: Nephrology

## 2016-07-23 DIAGNOSIS — E278 Other specified disorders of adrenal gland: Secondary | ICD-10-CM

## 2016-07-23 DIAGNOSIS — E876 Hypokalemia: Secondary | ICD-10-CM

## 2016-07-23 DIAGNOSIS — I129 Hypertensive chronic kidney disease with stage 1 through stage 4 chronic kidney disease, or unspecified chronic kidney disease: Secondary | ICD-10-CM

## 2016-07-23 DIAGNOSIS — N201 Calculus of ureter: Secondary | ICD-10-CM

## 2016-07-23 MED ORDER — GADOBENATE DIMEGLUMINE 529 MG/ML IV SOLN
20.0000 mL | Freq: Once | INTRAVENOUS | Status: AC | PRN
  Administered 2016-07-23: 20 mL via INTRAVENOUS

## 2016-07-25 ENCOUNTER — Telehealth: Payer: Self-pay | Admitting: Adult Health

## 2016-07-25 DIAGNOSIS — R058 Other specified cough: Secondary | ICD-10-CM

## 2016-07-25 DIAGNOSIS — R05 Cough: Secondary | ICD-10-CM

## 2016-07-25 LAB — BASIC METABOLIC PANEL
BUN/Creatinine Ratio: 20 (ref 9–23)
BUN: 18 mg/dL (ref 6–24)
CALCIUM: 9.4 mg/dL (ref 8.7–10.2)
CHLORIDE: 99 mmol/L (ref 96–106)
CO2: 25 mmol/L (ref 20–29)
Creatinine, Ser: 0.91 mg/dL (ref 0.57–1.00)
GFR calc non Af Amer: 70 mL/min/{1.73_m2} (ref >59)
GFR, EST AFRICAN AMERICAN: 81 mL/min/{1.73_m2} (ref >59)
Glucose: 102 mg/dL — ABNORMAL HIGH (ref 65–99)
Potassium: 4 mmol/L (ref 3.5–5.2)
Sodium: 139 mmol/L (ref 134–144)

## 2016-07-25 MED ORDER — MONTELUKAST SODIUM 10 MG PO TABS: 10.0000 mg | ORAL_TABLET | Freq: Every day | ORAL | 2 refills | Status: DC

## 2016-07-25 NOTE — Telephone Encounter (Signed)
Spoke with patient. She was requesting a refill on her Singulair 10mg . She was originally scheduled to TP earlier this week but the appt was cancelled due to a change in TP's schedule.   RX has been sent in to [facility] on [address]. Pt is aware. Nothing else needed at time of call.

## 2016-07-26 ENCOUNTER — Other Ambulatory Visit (INDEPENDENT_AMBULATORY_CARE_PROVIDER_SITE_OTHER): Payer: Managed Care, Other (non HMO)

## 2016-07-26 ENCOUNTER — Ambulatory Visit (INDEPENDENT_AMBULATORY_CARE_PROVIDER_SITE_OTHER): Payer: Managed Care, Other (non HMO) | Admitting: Nurse Practitioner

## 2016-07-26 VITALS — BP 110/66 | HR 64 | Ht 63.0 in | Wt 295.0 lb

## 2016-07-26 DIAGNOSIS — K5909 Other constipation: Secondary | ICD-10-CM

## 2016-07-26 DIAGNOSIS — Z1211 Encounter for screening for malignant neoplasm of colon: Secondary | ICD-10-CM | POA: Diagnosis not present

## 2016-07-26 LAB — CBC WITH DIFFERENTIAL/PLATELET
BASOS PCT: 0.5 % (ref 0.0–3.0)
Basophils Absolute: 0.1 10*3/uL (ref 0.0–0.1)
EOS PCT: 3.9 % (ref 0.0–5.0)
Eosinophils Absolute: 0.5 10*3/uL (ref 0.0–0.7)
HCT: 35 % — ABNORMAL LOW (ref 36.0–46.0)
Hemoglobin: 11.6 g/dL — ABNORMAL LOW (ref 12.0–15.0)
LYMPHS ABS: 3.5 10*3/uL (ref 0.7–4.0)
Lymphocytes Relative: 30 % (ref 12.0–46.0)
MCHC: 33.1 g/dL (ref 30.0–36.0)
MCV: 91.7 fl (ref 78.0–100.0)
MONOS PCT: 6.1 % (ref 3.0–12.0)
Monocytes Absolute: 0.7 10*3/uL (ref 0.1–1.0)
NEUTROS ABS: 7 10*3/uL (ref 1.4–7.7)
NEUTROS PCT: 59.5 % (ref 43.0–77.0)
Platelets: 310 10*3/uL (ref 150.0–400.0)
RBC: 3.81 Mil/uL — AB (ref 3.87–5.11)
RDW: 13.9 % (ref 11.5–15.5)
WBC: 11.7 10*3/uL — ABNORMAL HIGH (ref 4.0–10.5)

## 2016-07-26 LAB — AMYLASE: Amylase: 52 U/L (ref 27–131)

## 2016-07-26 MED ORDER — POLYETHYLENE GLYCOL 3350 17 GM/SCOOP PO POWD: ORAL | 3 refills | Status: DC

## 2016-07-26 NOTE — Patient Instructions (Addendum)
If you are age 58 or older, your body mass index should be between 23-30. Your Body mass index is 52.26 kg/m. If this is out of the aforementioned range listed, please consider follow up with your Primary Care Provider.  If you are age 26 or younger, your body mass index should be between 19-25. Your Body mass index is 52.26 kg/m. If this is out of the aformentioned range listed, please consider follow up with your Primary Care Provider.   You will need a colonoscopy in October with Dr. Silverio Decamp.  We will contact you at that time to set up the procedure at [facility].  We have sent the following medications to your pharmacy for you to pick up at your convenience: [facility] has requested that you go to the basement for the following lab work before leaving today: CBC w Diff Amylase  Thank you for choosing me and [facility].   Tye Savoy, NP

## 2016-07-26 NOTE — Progress Notes (Signed)
HPI: Patient is a 56 year old female with multiple medical problems not limited to chronic diastolic heart failure, HTN, obesity and OSA.  She was seen in April for worsening constipation, anal fissure and to discuss a colonoscopy. We saw her the year prior, felt she was high risk for procedures and recommended Cologard but she didn't follow through with the test. So patient was scheduled for colonoscopy with Dr. Henrene Pastor to be done at the hospital late June. She called just before the procedure regarding worsening reflux symptoms. Patient was advised that weight loss would be the most effective treatment.. Patient was offended by this and requested MD change,  Dr. Silverio Decamp accepted her.   Patient is here to discuss management of constipation. She tried Linzess for a month but it resulted in too loose of stools. MiraLAX works great but she requires 3 doses a day.  She drinks 5 bottles of water a day which meets the fluid allowance of 2 liters / day for heart failure. She pays nearly $18 for each bottle of MiraLAX. She asked about alternatives.    Past Medical History:  Diagnosis Date  . Acute on chronic diastolic CHF (congestive heart failure) ([place]) 04/11/2014  . Acute suppurative otitis media without spontaneous rupture of eardrum 02/04/2007   Centricity Description: OTITIS MEDIA, SUPPURATIVE, ACUTE, BILATERAL Qualifier: Diagnosis of  By: [name] MD, [name]  Centricity Description: OTITIS MEDIA, SUPPURATIVE, ACUTE Qualifier: Diagnosis of  By: [name] MD, [name]   . Allergic rhinitis   . CAD (coronary artery disease)    LHC (11/29/1999):  EF 60%; no significant CAD.  [name] Carpal tunnel syndrome   . Carpal tunnel syndrome 06/09/2006   Qualifier: Diagnosis of  By: Marca Ancona RMA, Lucy    . Chronic diastolic CHF (congestive heart failure) ([place])    a. Echo (01/21/11): Vigorous LVF, EF 65-70%, no RWMA, Gr 2 DD. b.  Echo (12/15): Moderate LVH, EF 85-27%, grade 2 diastolic dysfunction, mild LAE, normal RV  function c. 01/2015: echo showing EF of 60-65% with mild LVH  . Common migraine   . Diastolic heart failure ([place])   . Diverticulitis   . Hepatic steatosis   . Hepatomegaly   . Hiatal hernia   . Hyperlipidemia   . Hypertension   . IBS (irritable bowel syndrome)   . Internal hemorrhoids   . Morbid obesity ([place])   . OSA (obstructive sleep apnea)    CPAP dependent  . PNEUMONIA, ORGAN UNSPECIFIED 03/01/2009   Qualifier: Diagnosis of  By: [name] CMA, [name]    . Sinusitis, chronic 03/10/2012   CT sinuses 02/2012:  Chronic sinusitis with small A/F levels >> rx by ENT with prolonged augmentin F/u ct sinuses 04/2012:  Persistent sinus thickening >> rx with levaquin and clinda for 30 days.  Told to return for sinus scan in 6 weeks >> no showed for followup visit.  CT sinuses 11/2013:  Acute and chronic sinusitis noted.      Patient's surgical history, family medical history, social history, medications and allergies were all reviewed in Epic    Physical Exam: BP 110/66   Pulse 64   Ht 5\' 3"  (1.6 m)   Wt 295 lb (133.8 kg)   BMI 52.26 kg/m   GENERAL: obese black female in NAD PSYCH: :Pleasant, cooperative, normal affect EENT:  conjunctiva pink, mucous membranes moist, neck supple without masses CARDIAC:  RRR, no peripheral edema PULM: Normal respiratory effort, lungs CTA bilaterally, no wheezing ABDOMEN:  soft, nontender, nondistended, no  obvious masses SKIN:  turgor, no lesions seen Musculoskeletal:  Normal muscle tone, normal strength NEURO: Alert and oriented x 3, no focal neurologic deficits  ASSESSMENT and PLAN:  1. Pleasant 56 year old female with chronic constipation doing well on Miralax but she requires 3 doses a day. Linzess caused diarrhea.   -continue Miralax. Rx polyethylene glycol sent to pharmacy.Should be significantly less expensive   2. Colon cancer screening. She was scheduled for a colonoscopy at the hospital with Dr. Henrene Pastor but requested MD change just prior to  procedure so colonoscopy cancelled. Will get colonoscopy rescheduled at hospital with Dr. Silverio Decamp who has accepted her as a patient.     Tye Savoy , NP 07/26/2016, 11:54 AM

## 2016-08-02 NOTE — Progress Notes (Signed)
Reviewed and agree with documentation and assessment and plan. K. Veena Muriel Hannold , MD   

## 2016-08-23 ENCOUNTER — Other Ambulatory Visit: Payer: Self-pay | Admitting: Cardiovascular Disease

## 2016-08-23 MED ORDER — TORSEMIDE 20 MG PO TABS: 40.0000 mg | ORAL_TABLET | Freq: Two times a day (BID) | ORAL | 2 refills | Status: DC

## 2016-08-23 NOTE — Telephone Encounter (Signed)
°*  STAT* If patient is at the pharmacy, call can be transferred to refill team.   1. Which medications need to be refilled? (please list name of each medication and dose if known)Torsemide-pt has an appointment in October  2. Which pharmacy/location (including street and city if local pharmacy) is medication to be sent to?[facility]-[phone]  3. Do they need a 30 day or 90 day supply? 120 and refills

## 2016-08-27 ENCOUNTER — Ambulatory Visit (INDEPENDENT_AMBULATORY_CARE_PROVIDER_SITE_OTHER): Payer: 59 | Admitting: Adult Health

## 2016-08-27 ENCOUNTER — Encounter: Payer: Self-pay | Admitting: Adult Health

## 2016-08-27 DIAGNOSIS — R05 Cough: Secondary | ICD-10-CM

## 2016-08-27 DIAGNOSIS — J329 Chronic sinusitis, unspecified: Secondary | ICD-10-CM

## 2016-08-27 DIAGNOSIS — J45991 Cough variant asthma: Secondary | ICD-10-CM

## 2016-08-27 DIAGNOSIS — R058 Other specified cough: Secondary | ICD-10-CM

## 2016-08-27 NOTE — Progress Notes (Signed)
@Patient  ID: [name], female    DOB: 1960-07-29, 56 y.o.   MRN: [id number]  Chief Complaint  Patient presents with  . Medication Management    Asthma     Referring provider: Bartholome Bill, MD  HPI: 56 year old female never smoker followed for chronic cough  TEST  NO  06/01/2015   =  8 - Spirometry 06/01/2015  No obstruction/ poor effort/ reproducibility  Allergy profile 06/01/2015 >  Eos 0.4 /  IgE  35 neg RAST - trial of singulair 06/01/2015 >>> - Sinus CT 06/07/2015 > No definite sinusitis. Only a minimal amount of debris is noted within the right partition of the sphenoid sinus. - Re CT sinus 05/24/2016 > neg > try gabapentin 100 tid (did not purchase as could not tolerate the 300 strength previously  -CT chest >neg PE , lung clear.    08/27/2016 Follow up : Chronic cough  Pt returns for 2 month follow up . Patient says overall her cough is slightly better. She is using Chlor-Trimeton at bedtime. And as needed during the daytime. This seems to help along with Mucinex DM. She still has a cough but not as severe as it has been in the past. She denies any chest pain, orthopnea, PND. We reviewed all her medications organize them into a medication count with patient education. Appears she is taking her medications correctly    Allergies  Allergen Reactions  . Sulfonamide Derivatives Anaphylaxis, Swelling, Rash and Other (See Comments)    Swelling tongue and ear   . Doxycycline Nausea And Vomiting  . Latex Hives  . Ciprofloxacin Rash  . Fish Allergy Rash and Other (See Comments)    Only reaction to Mackerel.  No other issues with any type of fish or shellfish currently   . Hydrocortisone Rash  . Neomycin Rash    Immunization History  Administered Date(s) Administered  . Influenza Split 11/21/2013, 11/21/2015  . Influenza,inj,Quad PF,36+ Mos 02/23/2015  . Pneumococcal Polysaccharide-23 10/30/2005, 11/22/2015  . Td 08/30/2005    Past Medical History:  Diagnosis  Date  . Acute on chronic diastolic CHF (congestive heart failure) ([place]) 04/11/2014  . Acute suppurative otitis media without spontaneous rupture of eardrum 02/04/2007   Centricity Description: OTITIS MEDIA, SUPPURATIVE, ACUTE, BILATERAL Qualifier: Diagnosis of  By: [name] MD, [name]  Centricity Description: OTITIS MEDIA, SUPPURATIVE, ACUTE Qualifier: Diagnosis of  By: [name] MD, [name]   . Allergic rhinitis   . CAD (coronary artery disease)    LHC (11/29/1999):  EF 60%; no significant CAD.  [name] Carpal tunnel syndrome   . Carpal tunnel syndrome 06/09/2006   Qualifier: Diagnosis of  By: Marca Ancona RMA, Lucy    . Chronic diastolic CHF (congestive heart failure) ([place])    a. Echo (01/21/11): Vigorous LVF, EF 65-70%, no RWMA, Gr 2 DD. b.  Echo (12/15): Moderate LVH, EF 24-40%, grade 2 diastolic dysfunction, mild LAE, normal RV function c. 01/2015: echo showing EF of 60-65% with mild LVH  . Common migraine   . Diastolic heart failure ([place])   . Diverticulitis   . Hepatic steatosis   . Hepatomegaly   . Hiatal hernia   . Hyperlipidemia   . Hypertension   . IBS (irritable bowel syndrome)   . Internal hemorrhoids   . Morbid obesity ([place])   . OSA (obstructive sleep apnea)    CPAP dependent  . PNEUMONIA, ORGAN UNSPECIFIED 03/01/2009   Qualifier: Diagnosis of  By: [name] CMA, [name]    .  Sinusitis, chronic 03/10/2012   CT sinuses 02/2012:  Chronic sinusitis with small A/F levels >> rx by ENT with prolonged augmentin F/u ct sinuses 04/2012:  Persistent sinus thickening >> rx with levaquin and clinda for 30 days.  Told to return for sinus scan in 6 weeks >> no showed for followup visit.  CT sinuses 11/2013:  Acute and chronic sinusitis noted.      Tobacco History: History  Smoking Status  . Never Smoker  Smokeless Tobacco  . Never Used   Counseling given: Not Answered   Outpatient Encounter Prescriptions as of 08/27/2016  Medication Sig  . albuterol (ACCUNEB) 1.25 MG/3ML nebulizer solution Take 1  ampule by nebulization every 6 (six) hours as needed for wheezing.  [name] aspirin 81 MG EC tablet TAKE 1 TABLET (81 MG TOTAL) BY MOUTH DAILY.  . benzonatate (TESSALON) 200 MG capsule Take 1 capsule (200 mg total) by mouth 3 (three) times daily as needed for cough.  . Blood Pressure Monitoring (BLOOD PRESSURE MONITOR/ARM) DEVI 1 each by Does not apply route daily.  . chlorpheniramine (CHLOR-TRIMETON) 4 MG tablet Take 4 mg by mouth 2 (two) times daily as needed for allergies.  . Dextromethorphan-Guaifenesin (MUCINEX DM MAXIMUM STRENGTH) 60-1200 MG TB12 Take 1 tablet by mouth every 12 (twelve) hours.  . Diclofenac Sodium 3 % GEL Apply 1 application topically 3 (three) times daily. Knee pain  . dicyclomine (BENTYL) 10 MG capsule TAKE 1 CAPSULE(10 MG) BY MOUTH FOUR TIMES DAILY BEFORE MEALS AND AT BEDTIME  . famotidine (PEPCID) 20 MG tablet One at bedtime (Patient taking differently: Take 20 mg by mouth at bedtime. One at bedtime)  . fluticasone (FLONASE) 50 MCG/ACT nasal spray Place 2 sprays into both nostrils daily. (Patient taking differently: Place 2 sprays into both nostrils daily as needed for allergies. )  . hydrALAZINE (APRESOLINE) 25 MG tablet Take 1.5 tablets (37.5 mg total) by mouth 3 (three) times daily.  . isosorbide dinitrate (ISORDIL) 20 MG tablet Take 1 tablet (20 mg total) by mouth 3 (three) times daily.  [name] loratadine (CLARITIN) 10 MG tablet Take 10 mg by mouth daily.  [name] losartan (COZAAR) 100 MG tablet Take 0.5 tablets (50 mg total) by mouth daily.  . meclizine (ANTIVERT) 25 MG tablet Take 1 tablet (25 mg total) by mouth 3 (three) times daily as needed for dizziness.  . metaxalone (SKELAXIN) 800 MG tablet Take 1 tablet by mouth 3 (three) times daily as needed for muscle spasms.   . montelukast (SINGULAIR) 10 MG tablet Take 1 tablet (10 mg total) by mouth at bedtime.  . pantoprazole (PROTONIX) 40 MG tablet TAKE 1 TABLET BY MOUTH DAILY, TAKE 30 TO 60 MINUTES BEFORE FIRST MEAL OF THE DAY  .  polyethylene glycol (MIRALAX / GLYCOLAX) packet Take 17 g by mouth 2 (two) times daily.  . polyethylene glycol powder (GLYCOLAX/MIRALAX) powder Take 2 capfuls in the a.m. And one capful in the p.m.  [name] potassium chloride SA (K-DUR,KLOR-CON) 20 MEQ tablet TAKE 2 TABLET BY MOUTH THREE TIMES DAILY AND 1 TABLET EXTRA AS NEEDED  . senna (SENOKOT) 8.6 MG TABS tablet Take 1 tablet by mouth daily as needed for mild constipation.  [name] spironolactone (ALDACTONE) 25 MG tablet Take 0.5 tablets (12.5 mg total) by mouth 2 (two) times daily.  [name] topiramate (TOPAMAX) 25 MG tablet Take 25 mg by mouth 2 (two) times daily as needed (for headaches).   . torsemide (DEMADEX) 20 MG tablet Take 2 tablets (40 mg total) by mouth  2 (two) times daily.  . traMADol (ULTRAM) 50 MG tablet Take 1 tablet (50 mg total) by mouth every 6 (six) hours as needed for moderate pain. (Patient taking differently: Take 100 mg by mouth every 6 (six) hours as needed for moderate pain. )   No facility-administered encounter medications on file as of 08/27/2016.      Review of Systems  Constitutional:   No  weight loss, night sweats,  Fevers, chills, fatigue, or  lassitude.  HEENT:   No headaches,  Difficulty swallowing,  Tooth/dental problems, or  Sore throat,                No sneezing, itching, ear ache,  +nasal congestion, post nasal drip,   CV:  No chest pain,  Orthopnea, PND, swelling in lower extremities, anasarca, dizziness, palpitations, syncope.   GI  No heartburn, indigestion, abdominal pain, nausea, vomiting, diarrhea, change in bowel habits, loss of appetite, bloody stools.   Resp: No shortness of breath with exertion or at rest.   No chest wall deformity  Skin: no rash or lesions.  GU: no dysuria, change in color of urine, no urgency or frequency.  No flank pain, no hematuria   MS:  No joint pain or swelling.  No decreased range of motion.  No back pain.    Physical Exam  BP 130/66 (BP Location: Right Arm, Cuff Size:  Large)   Pulse 72   Ht 5\' 3"  (1.6 m)   Wt (!) 300 lb 6.4 oz (136.3 kg)   SpO2 98%   BMI 53.21 kg/m   GEN: A/Ox3; pleasant , NAD , obese    HEENT:  [place]/AT,  EACs-clear, TMs-wnl, NOSE-clear, THROAT-clear, no lesions, no postnasal drip or exudate noted.   NECK:  Supple w/ fair ROM; no JVD; normal carotid impulses w/o bruits; no thyromegaly or nodules palpated; no lymphadenopathy.    RESP  Clear  P & A; w/o, wheezes/ rales/ or rhonchi. no accessory muscle use, no dullness to percussion  CARD:  RRR, no m/r/g, tr peripheral edema, pulses intact, no cyanosis or clubbing.  GI:   Soft & nt; nml bowel sounds; no organomegaly or masses detected.   Musco: Warm bil, no deformities or joint swelling noted.   Neuro: alert, no focal deficits noted.    Skin: Warm, no lesions or rashes    Lab Results:  BNP Imaging: No results found.   Assessment & Plan:   Upper airway cough syndrome Chronic cough slightly improved on current regimen  With tx aimed at AR/GERD prevention  Plan  Patient Instructions  Continue on current regimen  Follow med calendar closely and bring to each visit .  Follow up with Dr. [name]  In 4-6 months and As needed       Sinusitis, chronic No active flare  Patient's medications were reviewed today and patient education was given. Computerized medication calendar was adjusted/completed   Plan  No changes.      Rexene Edison, NP 08/27/2016

## 2016-08-27 NOTE — Assessment & Plan Note (Signed)
Chronic cough slightly improved on current regimen  With tx aimed at AR/GERD prevention  Plan  Patient Instructions  Continue on current regimen  Follow med calendar closely and bring to each visit .  Follow up with Dr. [name]  In 4-6 months and As needed

## 2016-08-27 NOTE — Patient Instructions (Signed)
Continue on current regimen  Follow med calendar closely and bring to each visit .  Follow up with Dr. [name]  In 4-6 months and As needed

## 2016-08-27 NOTE — Assessment & Plan Note (Signed)
No active flare  Patient's medications were reviewed today and patient education was given. Computerized medication calendar was adjusted/completed   Plan  No changes.

## 2016-08-28 NOTE — Progress Notes (Signed)
Chart and office note reviewed in detail  > agree with a/p as outlined    

## 2016-08-30 NOTE — Addendum Note (Signed)
Addended by: Parke Poisson E on: 08/30/2016 12:48 PM   Modules accepted: Orders

## 2016-10-06 ENCOUNTER — Other Ambulatory Visit: Payer: Self-pay | Admitting: Cardiology

## 2016-10-07 NOTE — Telephone Encounter (Signed)
Rx has been sent to the pharmacy electronically. ° °

## 2016-10-11 ENCOUNTER — Ambulatory Visit: Payer: Managed Care, Other (non HMO) | Admitting: Pulmonary Disease

## 2016-10-15 ENCOUNTER — Other Ambulatory Visit: Payer: Self-pay

## 2016-10-15 DIAGNOSIS — R05 Cough: Secondary | ICD-10-CM

## 2016-10-15 DIAGNOSIS — R058 Other specified cough: Secondary | ICD-10-CM

## 2016-10-15 MED ORDER — MONTELUKAST SODIUM 10 MG PO TABS: 10.0000 mg | ORAL_TABLET | Freq: Every day | ORAL | 5 refills | Status: DC

## 2016-10-21 ENCOUNTER — Encounter: Payer: Self-pay | Admitting: Cardiovascular Disease

## 2016-10-21 ENCOUNTER — Ambulatory Visit (INDEPENDENT_AMBULATORY_CARE_PROVIDER_SITE_OTHER): Payer: Medicare Other | Admitting: Cardiovascular Disease

## 2016-10-21 VITALS — BP 130/73 | HR 104 | Ht 63.0 in | Wt 300.2 lb

## 2016-10-21 DIAGNOSIS — I10 Essential (primary) hypertension: Secondary | ICD-10-CM | POA: Diagnosis not present

## 2016-10-21 DIAGNOSIS — R002 Palpitations: Secondary | ICD-10-CM | POA: Diagnosis not present

## 2016-10-21 DIAGNOSIS — I5032 Chronic diastolic (congestive) heart failure: Secondary | ICD-10-CM | POA: Diagnosis not present

## 2016-10-21 NOTE — Progress Notes (Signed)
Cardiology Office Note   Date:  10/21/2016   ID:  [name], DOB [birth year], MRN [id number]  PCP:  Bartholome Bill, [name]  Cardiologist:   Skeet Latch, [name]   Chief Complaint  Patient presents with  . Follow-up      History of Present Illness: [name] is a 56 y.o. female with chronic diastolic heart failure, hypertension, hyperlipidemia, OSA, morbid obesity, and COPD who presents for follow up.  [name] was previously a patient of Dr. Percival Spanish.  She last saw him 02/2016 and was doing well.  June 2018 she developed hypotension and worsened renal function so her diuretics were held. She developed acute shortness of breath.  BNP was 31 and cardiac enzymes were negative.  She was diuresed with IV lasix. She followed up with her nephrologist and was started on torsemide 40mg  bid.  She uses metolazone rarely.  She reports that her weight has been stable.  Losartan was reduced due to hypotension.  Her breathing been stable and she denies lower extremity edema, orthopnea or PND. She wears her BiPAP faithfully.  For the last two months she has been walking the stairs twice daily for 10 minutes each time.  She has no exertional symptoms.  She reports two episodes of heart racing last week.  They occurred at rest and lasted for 5 minutes each time.  It was associated with shortness of breath but no chest pain, lightheadedness or dizziness.     Past Medical History:  Diagnosis Date  . Acute on chronic diastolic CHF (congestive heart failure) ([place]) 04/11/2014  . Acute suppurative otitis media without spontaneous rupture of eardrum 02/04/2007   Centricity Description: OTITIS MEDIA, SUPPURATIVE, ACUTE, BILATERAL Qualifier: Diagnosis of  By: [name], [name]  Centricity Description: OTITIS MEDIA, SUPPURATIVE, ACUTE Qualifier: Diagnosis of  By: [name], [name]   . Allergic rhinitis   . CAD (coronary artery disease)    LHC (11/29/1999):  EF 60%; no significant CAD.  [name] Carpal tunnel  syndrome   . Carpal tunnel syndrome 06/09/2006   Qualifier: Diagnosis of  By: Marca Ancona [name], [name]    . Chronic diastolic CHF (congestive heart failure) ([place])    a. Echo (01/21/11): Vigorous LVF, EF 65-70%, no RWMA, Gr 2 DD. b.  Echo (12/15): Moderate LVH, EF 99-83%, grade 2 diastolic dysfunction, mild LAE, normal RV function c. 01/2015: echo showing EF of 60-65% with mild LVH  . Common migraine   . Diastolic heart failure ([place])   . Diverticulitis   . Hepatic steatosis   . Hepatomegaly   . Hiatal hernia   . Hyperlipidemia   . Hypertension   . IBS (irritable bowel syndrome)   . Internal hemorrhoids   . Morbid obesity ([place])   . OSA (obstructive sleep apnea)    CPAP dependent  . PNEUMONIA, ORGAN UNSPECIFIED 03/01/2009   Qualifier: Diagnosis of  By: [name], [name]    . Sinusitis, chronic 03/10/2012   CT sinuses 02/2012:  Chronic sinusitis with small A/F levels >> rx by ENT with prolonged augmentin F/u ct sinuses 04/2012:  Persistent sinus thickening >> rx with levaquin and clinda for 30 days.  Told to return for sinus scan in 6 weeks >> no showed for followup visit.  CT sinuses 11/2013:  Acute and chronic sinusitis noted.      Past Surgical History:  Procedure Laterality Date  . ABDOMINAL HYSTERECTOMY    . CESAREAN SECTION    . NASAL SINUS SURGERY    .  PARTIAL HYSTERECTOMY    . TONSILLECTOMY    . TUBAL LIGATION    . UVULOPALATOPHARYNGOPLASTY       Current Outpatient Prescriptions  Medication Sig Dispense Refill  . albuterol (PROVENTIL) (2.5 MG/3ML) 0.083% nebulizer solution Take 2.5 mg by nebulization every 4 (four) hours as needed for wheezing or shortness of breath.    . Ascorbic Acid (VITAMIN C) 1000 MG tablet Take 1,000 mg by mouth daily.    [name] aspirin 81 MG EC tablet TAKE 1 TABLET (81 MG TOTAL) BY MOUTH DAILY. 90 tablet 2  . chlorpheniramine (CHLOR-TRIMETON) 4 MG tablet Take 2 tablets by mouth at bedtime.  May add 1 extra every 4 hours as needed for drainage, drippy nose, throat  clearing    . Dextromethorphan-Guaifenesin (MUCINEX DM MAXIMUM STRENGTH) 60-1200 MG TB12 Take 1 tablet by mouth 2 (two) times daily as needed (cough).     . Diclofenac Sodium 3 % GEL Apply 1 application topically 3 (three) times daily. Knee pain    . dicyclomine (BENTYL) 10 MG capsule TAKE 1 CAPSULE(10 MG) BY MOUTH FOUR TIMES DAILY BEFORE MEALS AND AT BEDTIME 90 capsule 0  . diltiazem 2 % GEL Use as directed when needed for hemorroids    . famotidine (PEPCID) 20 MG tablet One at bedtime 90 tablet 1  . fluticasone (FLONASE) 50 MCG/ACT nasal spray Place 2 sprays into both nostrils daily. 48 g 1  . hydrALAZINE (APRESOLINE) 25 MG tablet Take 1.5 tablets (37.5 mg total) by mouth 3 (three) times daily. 270 tablet 3  . isosorbide dinitrate (ISORDIL) 20 MG tablet Take 1 tablet (20 mg total) by mouth 3 (three) times daily. 270 tablet 3  . linaclotide (LINZESS) 290 MCG CAPS capsule 1 daily as directed for severe constipation    . losartan (COZAAR) 100 MG tablet Take 100 mg by mouth as directed. Take 1/2 tablet by mouth daily    . meclizine (ANTIVERT) 25 MG tablet Take 1 tablet (25 mg total) by mouth 3 (three) times daily as needed for dizziness. 30 tablet 11  . metolazone (ZAROXOLYN) 2.5 MG tablet Take 2.5 mg by mouth daily as needed (leg swelling, weight gain).    . montelukast (SINGULAIR) 10 MG tablet Take 1 tablet (10 mg total) by mouth at bedtime. 30 tablet 5  . pantoprazole (PROTONIX) 40 MG tablet TAKE 1 TABLET BY MOUTH DAILY, TAKE 30 TO 60 MINUTES BEFORE FIRST MEAL OF THE DAY 90 tablet 3  . polyethylene glycol powder (GLYCOLAX/MIRALAX) powder Take 2 capfuls in the a.m. And one capful in the p.m. 578 g 3  . potassium chloride SA (K-DUR,KLOR-CON) 20 MEQ tablet 3 tablets by mouth twice daily    . potassium chloride SA (K-DUR,KLOR-CON) 20 MEQ tablet TAKE 3 TABLETS BY MOUTH THREE TIMES DAILY 270 tablet 1  . spironolactone (ALDACTONE) 25 MG tablet Take 0.5 tablets (12.5 mg total) by mouth 2 (two) times  daily. 90 tablet 3  . topiramate (TOPAMAX) 25 MG tablet Take 25 mg by mouth 2 (two) times daily as needed (for headaches).     . torsemide (DEMADEX) 20 MG tablet Take 2 tablets (40 mg total) by mouth 2 (two) times daily. 60 tablet 2  . traMADol (ULTRAM) 50 MG tablet Take 1 tablet (50 mg total) by mouth every 6 (six) hours as needed for moderate pain. 40 tablet 0   No current facility-administered medications for this visit.     Allergies:   Sulfonamide derivatives; Doxycycline; Latex; Ciprofloxacin; Fish allergy; Hydrocortisone; and  Neomycin    Social History:  The patient  reports that she has never smoked. She has never used smokeless tobacco. She reports that she does not drink alcohol or use drugs.   Family History:  The patient's family history includes Asthma in her sister; Breast cancer in her unknown relative; Coronary artery disease in her unknown relative; Heart attack in her mother; Heart disease in her mother; Hypertension in her mother.    ROS:  Please see the history of present illness.   Otherwise, review of systems are positive for none.   All other systems are reviewed and negative.    PHYSICAL EXAM: VS:  BP 130/73   Pulse (!) 104   Ht 5\' 3"  (1.6 m)   Wt (!) 136.2 kg (300 lb 3.2 oz)   BMI 53.18 kg/m  , BMI Body mass index is 53.18 kg/m. GENERAL:  Well appearing.  No acute distress.  Morbidly obese HEENT:  Pupils equal round and reactive, fundi not visualized, oral mucosa unremarkable NECK:  No jugular venous distention, waveform within normal limits, carotid upstroke brisk and symmetric, no bruits, no thyromegaly LUNGS:  Clear to auscultation bilaterally HEART:  RRR.  PMI not displaced or sustained,S1 and S2 within normal limits, no S3, no S4, no clicks, no rubs, no murmurs ABD:  Flat, positive bowel sounds normal in frequency in pitch, no bruits, no rebound, no guarding, no midline pulsatile mass, no hepatomegaly, no splenomegaly EXT:  2 plus pulses throughout, no  edema, no cyanosis no clubbing SKIN:  No rashes no nodules NEURO:  Cranial nerves II through XII grossly intact, motor grossly intact throughout PSYCH:  Cognitively intact, oriented to person place and time   EKG:  EKG is ordered today. The ekg ordered today demonstrates sinus tachycardia.  Rate 014 bpm.    Echocardiogram: 03/2015 Study Conclusions  - Left ventricle: The cavity size was normal. Doppler parameters are consistent with abnormal left ventricular relaxation (grade 1 diastolic dysfunction). - Impressions: Extremely poor acoustic windows limit study. With echocontrast LVEF and RVEF appear to be normal. Cannot fully evaluate all regional wall segments.  Impressions: - Extremely poor acoustic windows limit study. With echocontrast LVEF and RVEF appear to be normal. Cannot fully evaluate all regional wall segments.   Echocardiogram: 02/20/2015 Study Conclusions  - Left ventricle: The cavity size was normal. Wall thickness was increased in a pattern of mild LVH. Systolic function was normal. The estimated ejection fraction was in the range of 60% to 65%.   Recent Labs: 04/12/2016: ALT 20; Magnesium 1.9 05/01/2016: TSH 1.05 06/26/2016: B Natriuretic Peptide 31.5 07/25/2016: BUN 18; Creatinine, Ser 0.91; Potassium 4.0; Sodium 139 07/26/2016: Hemoglobin 11.6; Platelets 310.0    Lipid Panel    Component Value Date/Time   CHOL 169 02/20/2015 0440   TRIG 310 (H) 02/20/2015 0440   HDL 35 (L) 02/20/2015 0440   CHOLHDL 4.8 02/20/2015 0440   VLDL 62 (H) 02/20/2015 0440   LDLCALC 72 02/20/2015 0440      Wt Readings from Last 3 Encounters:  10/21/16 (!) 136.2 kg (300 lb 3.2 oz)  08/27/16 (!) 136.3 kg (300 lb 6.4 oz)  07/26/16 133.8 kg (295 lb)      ASSESSMENT AND PLAN:  # Chronic diastolic heart failure:  # Hypertension:  Ms. Creason is euvolemic. Continue hydralazine, Imdur, metolazone, spironolactone and torsemide.   # Palpitations: 7 day event  monitor.   # Morbid obesity: Ms. Mackley was encouraged to keep up her exercise routine.  Current medicines are reviewed at length with the patient today.  The patient does not have concerns regarding medicines.  The following changes have been made:  no change  Labs/ tests ordered today include:   Orders Placed This Encounter  Procedures  . CARDIAC EVENT MONITOR  . EKG 12-Lead     Disposition:   FU with Bella Brummet C. Oval Linsey, [name], [facility] in 3 months.     This note was written with the assistance of speech recognition software.  Please excuse any transcriptional errors.  Signed, Samin Milke C. Oval Linsey, [name], [facility]  10/21/2016 6:51 PM    [place]

## 2016-10-21 NOTE — Patient Instructions (Signed)
Medication Instructions:  Your physician recommends that you continue on your current medications as directed. Please refer to the Current Medication list given to you today.  Labwork: NONE  Testing/Procedures: Your physician has recommended that you wear an event monitor. Event monitors are medical devices that record the heart's electrical activity. Doctors most often [place] these monitors to diagnose arrhythmias. Arrhythmias are problems with the speed or rhythm of the heartbeat. The monitor is a small, portable device. You can wear one while you do your normal daily activities. This is usually used to diagnose what is causing palpitations/syncope (passing out). [place] [address]  Follow-Up: Your physician recommends that you schedule a follow-up appointment in: 3 MONTH OV   If you need a refill on your cardiac medications before your next appointment, please call your pharmacy.

## 2016-10-22 ENCOUNTER — Other Ambulatory Visit: Payer: Self-pay | Admitting: Cardiology

## 2016-10-28 ENCOUNTER — Telehealth: Payer: Self-pay | Admitting: Cardiovascular Disease

## 2016-10-28 NOTE — Telephone Encounter (Signed)
New message    Pt is calling back for [facility].

## 2016-10-28 NOTE — Telephone Encounter (Signed)
Advised patient of lab results  

## 2016-11-06 ENCOUNTER — Ambulatory Visit (INDEPENDENT_AMBULATORY_CARE_PROVIDER_SITE_OTHER): Payer: Medicare Other

## 2016-11-06 DIAGNOSIS — R002 Palpitations: Secondary | ICD-10-CM

## 2016-11-08 ENCOUNTER — Telehealth: Payer: Self-pay | Admitting: Internal Medicine

## 2016-11-08 MED ORDER — ALBUTEROL SULFATE (2.5 MG/3ML) 0.083% IN NEBU: 2.5000 mg | INHALATION_SOLUTION | RESPIRATORY_TRACT | 2 refills | Status: DC | PRN

## 2016-11-08 NOTE — Telephone Encounter (Signed)
Spoke with patient. Advised that I would go ahead and call in RX for her. She verbalized understanding. Nothing else needed at time of call.

## 2016-11-11 ENCOUNTER — Encounter: Payer: Self-pay | Admitting: *Deleted

## 2016-11-11 NOTE — Progress Notes (Signed)
Patient ID: [name], female   DOB: 02-Feb-1960, 56 y.o.   MRN: [id number] Patient had an allergic reaction to [employer] MCT-1 Patch.  [employer] shipped the patient a MCT-3 monitor with sensitive skin electrodes.  Patient walked into office on 11/11/16.  The MCT -3 monitor was applied and the patient was shown how to use it.

## 2016-11-20 ENCOUNTER — Other Ambulatory Visit: Payer: Self-pay | Admitting: Cardiovascular Disease

## 2016-11-20 NOTE — Telephone Encounter (Signed)
Please review for refill, Thanks !  

## 2016-11-20 NOTE — Telephone Encounter (Signed)
REFILL 

## 2016-11-25 ENCOUNTER — Ambulatory Visit (INDEPENDENT_AMBULATORY_CARE_PROVIDER_SITE_OTHER): Payer: Medicare Other | Admitting: Adult Health

## 2016-11-25 ENCOUNTER — Encounter: Payer: Self-pay | Admitting: Adult Health

## 2016-11-25 DIAGNOSIS — J440 Chronic obstructive pulmonary disease with acute lower respiratory infection: Secondary | ICD-10-CM | POA: Diagnosis not present

## 2016-11-25 MED ORDER — PROMETHAZINE-CODEINE 6.25-10 MG/5ML PO SYRP: 5.0000 mL | ORAL_SOLUTION | Freq: Four times a day (QID) | ORAL | 0 refills | Status: DC | PRN

## 2016-11-25 MED ORDER — AMOXICILLIN-POT CLAVULANATE 875-125 MG PO TABS: 1.0000 | ORAL_TABLET | Freq: Two times a day (BID) | ORAL | 0 refills | Status: AC

## 2016-11-25 MED ORDER — PREDNISONE 10 MG PO TABS: ORAL_TABLET | ORAL | 0 refills | Status: DC

## 2016-11-25 NOTE — Assessment & Plan Note (Signed)
Flare   Plan  Patient Instructions  Augmentin 875mg  Twice daily  For 7 days ,take with food Prednisone taper over next week.  Mucinex DM Twice daily  As needed  Cough/congestion  Tessalon Three times a day  As needed  Cough .  Phenergan w/ codeine 1 tsp every 6hr As needed  Cough , may make you sleepy Saline nasal rinses As needed   Follow up Dr. [name]  In 1 months and As needed   Please contact office for sooner follow up if symptoms do not improve or worsen or seek emergency care

## 2016-11-25 NOTE — Patient Instructions (Addendum)
Augmentin 875mg  Twice daily  For 7 days ,take with food Prednisone taper over next week.  Mucinex DM Twice daily  As needed  Cough/congestion  Tessalon Three times a day  As needed  Cough .  Phenergan w/ codeine 1 tsp every 6hr As needed  Cough , may make you sleepy Saline nasal rinses As needed   Follow up Dr. [name]  In 1 months and As needed   Please contact office for sooner follow up if symptoms do not improve or worsen or seek emergency care

## 2016-11-25 NOTE — Progress Notes (Signed)
@Patient  ID: [name], female    DOB: 12-25-1960, 56 y.o.   MRN: [id number]  Chief Complaint  Patient presents with  . Acute Visit    Bronchitis     Referring provider: Bartholome Bill, MD  HPI: 56 year old female never smoker followed for chronic cough  TEST  NO 06/01/2015 = 8 - Spirometry 06/01/2015 No obstruction/ poor effort/ reproducibility  Allergy profile 06/01/2015 >Eos 0.4 / IgE 35 neg RAST - trial of singulair 06/01/2015 >>> - Sinus CT 06/07/2015 >No definite sinusitis. Only a minimal amount of debris is noted within the right partition of the sphenoid sinus. - Re CT sinus 05/24/2016 >neg >try gabapentin 100 tid (did not purchase as could not tolerate the 300 strength previously  -CT chest >neg PE , lung clear.    11/25/2016 Acute OV : Cough  Patient presents for an acute office visit.  She complains of 1 week of thick mucus , chest tightness, soreness in chest from coughing, has  Sinus drainage and congestion .  Taking mucinex and tessalon with minimal relief. Taking chlor tabs . Cough is keeping her up at night .  Eating well with no n/v.d.  No fever , chest pain, orthopnea or increased edema.     Allergies  Allergen Reactions  . Sulfonamide Derivatives Anaphylaxis, Swelling, Rash and Other (See Comments)    Swelling tongue and ear   . Doxycycline Nausea And Vomiting  . Latex Hives  . Ciprofloxacin Rash  . Fish Allergy Rash and Other (See Comments)    Only reaction to Mackerel.  No other issues with any type of fish or shellfish currently   . Hydrocortisone Rash  . Neomycin Rash    Immunization History  Administered Date(s) Administered  . Influenza Split 11/21/2013, 11/21/2015  . Influenza,inj,Quad PF,6+ Mos 02/23/2015  . Pneumococcal Polysaccharide-23 10/30/2005, 11/22/2015  . Td 08/30/2005    Past Medical History:  Diagnosis Date  . Acute on chronic diastolic CHF (congestive heart failure) ([place]) 04/11/2014  . Acute suppurative  otitis media without spontaneous rupture of eardrum 02/04/2007   Centricity Description: OTITIS MEDIA, SUPPURATIVE, ACUTE, BILATERAL Qualifier: Diagnosis of  By: [name] MD, [name]  Centricity Description: OTITIS MEDIA, SUPPURATIVE, ACUTE Qualifier: Diagnosis of  By: [name] MD, [name]   . Allergic rhinitis   . CAD (coronary artery disease)    LHC (11/29/1999):  EF 60%; no significant CAD.  [name] Carpal tunnel syndrome   . Carpal tunnel syndrome 06/09/2006   Qualifier: Diagnosis of  By: Marca Ancona RMA, Lucy    . Chronic diastolic CHF (congestive heart failure) ([place])    a. Echo (01/21/11): Vigorous LVF, EF 65-70%, no RWMA, Gr 2 DD. b.  Echo (12/15): Moderate LVH, EF 63-14%, grade 2 diastolic dysfunction, mild LAE, normal RV function c. 01/2015: echo showing EF of 60-65% with mild LVH  . Common migraine   . Diastolic heart failure ([place])   . Diverticulitis   . Hepatic steatosis   . Hepatomegaly   . Hiatal hernia   . Hyperlipidemia   . Hypertension   . IBS (irritable bowel syndrome)   . Internal hemorrhoids   . Morbid obesity ([place])   . OSA (obstructive sleep apnea)    CPAP dependent  . PNEUMONIA, ORGAN UNSPECIFIED 03/01/2009   Qualifier: Diagnosis of  By: [name] CMA, [name]    . Sinusitis, chronic 03/10/2012   CT sinuses 02/2012:  Chronic sinusitis with small A/F levels >> rx by ENT with prolonged augmentin F/u ct sinuses  04/2012:  Persistent sinus thickening >> rx with levaquin and clinda for 30 days.  Told to return for sinus scan in 6 weeks >> no showed for followup visit.  CT sinuses 11/2013:  Acute and chronic sinusitis noted.      Tobacco History: Social History   Tobacco Use  Smoking Status Never Smoker  Smokeless Tobacco Never Used   Counseling given: Not Answered   Outpatient Encounter Medications as of 11/25/2016  Medication Sig  . albuterol (PROVENTIL) (2.5 MG/3ML) 0.083% nebulizer solution Take 3 mLs (2.5 mg total) by nebulization every 4 (four) hours as needed for wheezing or  shortness of breath.  . Ascorbic Acid (VITAMIN C) 1000 MG tablet Take 1,000 mg by mouth daily.  [name] aspirin 81 MG EC tablet TAKE 1 TABLET (81 MG TOTAL) BY MOUTH DAILY.  . chlorpheniramine (CHLOR-TRIMETON) 4 MG tablet Take 2 tablets by mouth at bedtime.  May add 1 extra every 4 hours as needed for drainage, drippy nose, throat clearing  . Dextromethorphan-Guaifenesin (MUCINEX DM MAXIMUM STRENGTH) 60-1200 MG TB12 Take 1 tablet by mouth 2 (two) times daily as needed (cough).   . Diclofenac Sodium 3 % GEL Apply 1 application topically 3 (three) times daily. Knee pain  . dicyclomine (BENTYL) 10 MG capsule TAKE 1 CAPSULE(10 MG) BY MOUTH FOUR TIMES DAILY BEFORE MEALS AND AT BEDTIME  . diltiazem 2 % GEL Use as directed when needed for hemorroids  . famotidine (PEPCID) 20 MG tablet One at bedtime  . fluticasone (FLONASE) 50 MCG/ACT nasal spray Place 2 sprays into both nostrils daily.  . isosorbide dinitrate (ISORDIL) 20 MG tablet Take 1 tablet (20 mg total) by mouth 3 (three) times daily.  [name] linaclotide (LINZESS) 290 MCG CAPS capsule 1 daily as directed for severe constipation  . losartan (COZAAR) 100 MG tablet Take 100 mg by mouth as directed. Take 1/2 tablet by mouth daily  . meclizine (ANTIVERT) 25 MG tablet Take 1 tablet (25 mg total) by mouth 3 (three) times daily as needed for dizziness.  . metolazone (ZAROXOLYN) 2.5 MG tablet Take 2.5 mg by mouth daily as needed (leg swelling, weight gain).  . montelukast (SINGULAIR) 10 MG tablet Take 1 tablet (10 mg total) by mouth at bedtime.  . pantoprazole (PROTONIX) 40 MG tablet TAKE 1 TABLET BY MOUTH DAILY, TAKE 30 TO 60 MINUTES BEFORE FIRST MEAL OF THE DAY  . polyethylene glycol powder (GLYCOLAX/MIRALAX) powder Take 2 capfuls in the a.m. And one capful in the p.m.  [name] potassium chloride SA (K-DUR,KLOR-CON) 20 MEQ tablet TAKE 3 TABLETS BY MOUTH THREE TIMES DAILY  . spironolactone (ALDACTONE) 25 MG tablet TAKE 0.5 TABLETS (12.5 MG TOTAL) BY MOUTH 2 (TWO) TIMES  DAILY.  [name] topiramate (TOPAMAX) 25 MG tablet Take 25 mg by mouth 2 (two) times daily as needed (for headaches).   . torsemide (DEMADEX) 20 MG tablet TAKE 2 TABLETS BY MOUTH 2 TIMES DAILY  . traMADol (ULTRAM) 50 MG tablet Take 1 tablet (50 mg total) by mouth every 6 (six) hours as needed for moderate pain.  [name] amoxicillin-clavulanate (AUGMENTIN) 875-125 MG tablet Take 1 tablet 2 (two) times daily for 7 days by mouth.  . hydrALAZINE (APRESOLINE) 25 MG tablet Take 1.5 tablets (37.5 mg total) by mouth 3 (three) times daily.  . predniSONE (DELTASONE) 10 MG tablet 4 tabs for 2 days, then 3 tabs for 2 days, 2 tabs for 2 days, then 1 tab for 2 days, then stop  . [DISCONTINUED] potassium chloride SA (K-DUR,KLOR-CON)  20 MEQ tablet 3 tablets by mouth twice daily   No facility-administered encounter medications on file as of 11/25/2016.      Review of Systems  Constitutional:   No  weight loss, night sweats,  Fevers, chills,  +fatigue, or  lassitude.  HEENT:   No headaches,  Difficulty swallowing,  Tooth/dental problems, or  Sore throat,                No sneezing, itching, ear ache, + nasal congestion, post nasal drip,   CV:  No chest pain,  Orthopnea, PND, swelling in lower extremities, anasarca, dizziness, palpitations, syncope.   GI  No heartburn, indigestion, abdominal pain, nausea, vomiting, diarrhea, change in bowel habits, loss of appetite, bloody stools.   Resp:    No chest wall deformity  Skin: no rash or lesions.  GU: no dysuria, change in color of urine, no urgency or frequency.  No flank pain, no hematuria   MS:  No joint pain or swelling.  No decreased range of motion.  No back pain.    Physical Exam  BP 138/76 (BP Location: Left Arm, Cuff Size: Large)   Pulse 93   Ht 5\' 3"  (1.6 m)   Wt 300 lb (136.1 kg)   SpO2 97%   BMI 53.14 kg/m   GEN: A/Ox3; pleasant , NAD, obese    HEENT:  [place]/AT,  EACs-clear, TMs-wnl, NOSE-clear, THROAT-clear, no lesions, no postnasal drip or  exudate noted.   NECK:  Supple w/ fair ROM; no JVD; normal carotid impulses w/o bruits; no thyromegaly or nodules palpated; no lymphadenopathy.    RESP  Clear  P & A; w/o, wheezes/ rales/ or rhonchi. no accessory muscle use, no dullness to percussion  CARD:  RRR, no m/r/g, no peripheral edema, pulses intact, no cyanosis or clubbing.  GI:   Soft & nt; nml bowel sounds; no organomegaly or masses detected.   Musco: Warm bil, no deformities or joint swelling noted.   Neuro: alert, no focal deficits noted.    Skin: Warm, no lesions or rashes    Lab Results:  Imaging: No results found.   Assessment & Plan:   Bronchitis, chronic obstructive w acute bronchitis (HCC) Flare   Plan  Patient Instructions  Augmentin 875mg  Twice daily  For 7 days ,take with food Prednisone taper over next week.  Mucinex DM Twice daily  As needed  Cough/congestion  Tessalon Three times a day  As needed  Cough .  Phenergan w/ codeine 1 tsp every 6hr As needed  Cough , may make you sleepy Saline nasal rinses As needed   Follow up Dr. [name]  In 1 months and As needed   Please contact office for sooner follow up if symptoms do not improve or worsen or seek emergency care          Rexene Edison, NP 11/25/2016

## 2016-11-25 NOTE — Progress Notes (Signed)
Chart and office note reviewed in detail  > agree with a/p as outlined    

## 2016-11-25 NOTE — Addendum Note (Signed)
Addended by: Parke Poisson E on: 11/25/2016 11:22 AM   Modules accepted: Orders

## 2016-12-17 ENCOUNTER — Other Ambulatory Visit: Payer: Self-pay | Admitting: Nurse Practitioner

## 2016-12-27 ENCOUNTER — Encounter: Payer: Self-pay | Admitting: Internal Medicine

## 2016-12-27 ENCOUNTER — Ambulatory Visit (INDEPENDENT_AMBULATORY_CARE_PROVIDER_SITE_OTHER): Payer: Medicare Other | Admitting: Internal Medicine

## 2016-12-27 VITALS — BP 130/60 | HR 92 | Ht 63.0 in | Wt 314.0 lb

## 2016-12-27 DIAGNOSIS — R058 Other specified cough: Secondary | ICD-10-CM

## 2016-12-27 DIAGNOSIS — R05 Cough: Secondary | ICD-10-CM

## 2016-12-27 MED ORDER — FLUTTER DEVI: 1.0000 "application " | 0 refills | Status: AC

## 2016-12-27 MED ORDER — TRAMADOL HCL 50 MG PO TABS: 50.0000 mg | ORAL_TABLET | Freq: Four times a day (QID) | ORAL | 0 refills | Status: DC | PRN

## 2016-12-27 NOTE — Patient Instructions (Addendum)
For cough > mucinex dm up 1200 mg every 12 hours and use the flutter valve and if still coughing as per your action action plan ok to tramadol x 3 days up to 2 every 4 hours  Please schedule a follow up office visit in 4 weeks, sooner if needed  with all medications /inhalers/ solutions in hand so we can verify exactly what you are taking. This includes all medications from all doctors and over the counters to See [name] NP  - late add rechallenge with gabapentin 100 tid at next ov if still coughing as not clear she ever took it

## 2016-12-27 NOTE — Progress Notes (Signed)
Subjective:   Patient ID: [name], female    DOB: [birth year]   MRN: [id number]    Brief patient profile:  43 yobf never smoker  with   Chronic cough, h/o sinusitis and ? Asthma with recurrent severe cough since her 40's "every few months"    History of Present Illness  06/10/2013 ER follow up  Was seen in ER on 5/15 for bronchitis , tx w/ Zpack and pred taper. CXR w/ no sign of acute changes, noted diffuse interstitial markings. Says got only minimally improved.  Complains of prod cough with green mucus, wheezing, dyspnea, tightness.  finished zpak and pred pak yesterday.  would like to discuss beginning nebs, says RT in ER said she should be on this at home.  Was started on QVAR last ov but not taking .   rec Augmentin 875mg  Twice daily  For 7 days  Mucinex DM Twice daily  As needed  Cough/congestion.  Fluids and rest   Begin Symbicort 80/4.79mcg 2 puffs Twice daily  - rinse after use> did not consistently help cough     05/21/2016  Acute exteneded  ov/[name] re:  Refractory cough since Jan 2017 : uacs/ very little evidence for asthma  Chief Complaint  Patient presents with  . Acute Visit    Increased cough with light yellow sputum x 6 days. She is also having increased SOB and wheezing.   only really better on tramadol/ non adherent with symb/ using alb qid but did not disclose it previously Cough to point of gag/ vomit at hs worse sob/coughing fits x one week  Did not bring meds or med calendar as instructed  rec schedule sinus CT > neg 05/24/16  See calendar for specific medication instructions and bring it back for each and every office visit  > did not do See [name] win 4 weeks with all your medication> did not do      07/17/2016  Acute  ov/[name] re:  Uacs/ pnds with neg ct/ neg allergy w/u  Chief Complaint  Patient presents with  . Acute Visit    Pt c/o sinus pressure, HA and cough with clear sputum x 5 days. She is using her neb 2 x daily on average.    has med  calendar and one bag of meds. Med calendar not updated with changes/ not following acitons plans Not limited by breathing from desired activities but extremely sedentary  Having trouble using bipap due to "drainage" but not taking hs h1 as rec  Cough no better with tessalon Ha generalized and worse as day goes on, no nasal obst symptoms  rec For drainage / throat tickle try take CHLORPHENIRAMINE  4 mg - take one every 4 hours as needed - available over the counter- may cause drowsiness so start with just a bedtime x two atumtomatically  and see how you tolerate it before trying in daytime   For thick mucus/ cough/congention > Mucinex dm 1200 mg every 12 hours as needed     11/25/16 [name] eval Was better then first week in nov 2019 worse coughing Augmentin 875mg  Twice daily  For 7 days ,take with food Prednisone taper over next week.  Mucinex DM Twice daily  As needed  Cough/congestion  Tessalon Three times a day  As needed  Cough .  Phenergan w/ codeine 1 tsp every 6hr As needed  Cough , may make you sleepy Saline nasal rinses As needed     12/27/2016  f/u  ov/[name] re: refractory cough no resp to pred/ augmentin  Chief Complaint  Patient presents with  . Follow-up    follow up from TP, feels like she is not doing better, has runny nose, wheezing, congestion, cough with yellow sputum,sob   when head stops up/ post nasal drainage esp p supper/ hs and  coughing and lasts all night  Last neb was 2 days prior to OV   Not able to verify what she's taking or whether she's using  1st gen H1 blockers per guidelines   When not coughing no sob over baseline    No obvious day to day or daytime variability or assoc  mucus plugs or hemoptysis or cp or chest tightness,  or overt   hb symptoms. No unusual exposure hx or h/o childhood pna/ asthma or knowledge of premature birth.  Sleeping ok flat without nocturnal  or early am exacerbation  of respiratory  c/o's or need for noct saba. Also denies any  obvious fluctuation of symptoms with weather or environmental changes or other aggravating or alleviating factors except as outlined above   Current Allergies, Complete Past Medical History, Past Surgical History, Family History, and Social History were reviewed in [employer] record.  ROS  The following are not active complaints unless bolded Hoarseness, sore throat, dysphagia, dental problems, itching, sneezing,  nasal congestion or discharge of excess mucus or purulent secretions, ear ache,   fever, chills, sweats, unintended wt loss or wt gain, classically pleuritic or exertional cp,  orthopnea pnd or leg swelling, presyncope, palpitations, abdominal pain, anorexia, nausea, vomiting, diarrhea  or change in bowel habits or change in bladder habits, change in stools or change in urine, dysuria, hematuria,  rash, arthralgias, visual complaints, headache, numbness, weakness or ataxia or problems with walking or coordination,  change in mood/affect or memory.        Current Meds/ not able to verify / no med calendar   Medication Sig  . albuterol (PROVENTIL) (2.5 MG/3ML) 0.083% nebulizer solution Take 3 mLs (2.5 mg total) by nebulization every 4 (four) hours as needed for wheezing or shortness of breath.  . Ascorbic Acid (VITAMIN C) 1000 MG tablet Take 1,000 mg by mouth daily.  [name] aspirin 81 MG EC tablet TAKE 1 TABLET (81 MG TOTAL) BY MOUTH DAILY.  . chlorpheniramine (CHLOR-TRIMETON) 4 MG tablet Take 2 tablets by mouth at bedtime.  May add 1 extra every 4 hours as needed for drainage, drippy nose, throat clearing  . Dextromethorphan-Guaifenesin (MUCINEX DM MAXIMUM STRENGTH) 60-1200 MG TB12 Take 1 tablet by mouth 2 (two) times daily as needed (cough).   . Diclofenac Sodium 3 % GEL Apply 1 application topically 3 (three) times daily. Knee pain  . dicyclomine (BENTYL) 10 MG capsule TAKE 1 CAPSULE(10 MG) BY MOUTH FOUR TIMES DAILY BEFORE MEALS AND AT BEDTIME  . famotidine (PEPCID) 20  MG tablet One at bedtime  . fluticasone (FLONASE) 50 MCG/ACT nasal spray Place 2 sprays into both nostrils daily.  . isosorbide dinitrate (ISORDIL) 20 MG tablet Take 1 tablet (20 mg total) by mouth 3 (three) times daily.  [name] losartan (COZAAR) 100 MG tablet Take 100 mg by mouth as directed. Take 1/2 tablet by mouth daily  . metolazone (ZAROXOLYN) 2.5 MG tablet Take 2.5 mg by mouth daily as needed (leg swelling, weight gain).  . montelukast (SINGULAIR) 10 MG tablet Take 1 tablet (10 mg total) by mouth at bedtime.  . pantoprazole (PROTONIX) 40 MG tablet TAKE 1 TABLET  BY MOUTH DAILY, TAKE 30 TO 60 MINUTES BEFORE FIRST MEAL OF THE DAY  . polyethylene glycol powder (GLYCOLAX/MIRALAX) powder MIX 2 CAPFULS IN MORNING WITH LIQUID AND MIX ONE CAPFUL IN THE EVENING  . potassium chloride SA (K-DUR,KLOR-CON) 20 MEQ tablet TAKE 3 TABLETS BY MOUTH THREE TIMES DAILY  . spironolactone (ALDACTONE) 25 MG tablet TAKE 0.5 TABLETS (12.5 MG TOTAL) BY MOUTH 2 (TWO) TIMES DAILY.  [name] topiramate (TOPAMAX) 25 MG tablet Take 25 mg by mouth 2 (two) times daily as needed (for headaches).   . torsemide (DEMADEX) 20 MG tablet TAKE 2 TABLETS BY MOUTH 2 TIMES DAILY  . traMADol (ULTRAM) 50 MG tablet Take 1 tablet (50 mg total) by mouth every 6 (six) hours as needed for moderate pain.  . [DISCONTINUED] meclizine (ANTIVERT) 25 MG tablet Take 1 tablet (25 mg total) by mouth 3 (three) times daily as needed for dizziness.  . [DISCONTINUED] traMADol (ULTRAM) 50 MG tablet Take 1 tablet (50 mg total) by mouth every 6 (six) hours as needed for moderate pain.                         Objective:   Physical Exam   amb massively bf nad / did not cough once at ov   06/01/2015        294  > 07/31/2015   295 >   05/21/2016   298 > 07/17/2016  299 > 12/27/2016   314  04/07/2015        297   09/17/13 303 lb 12.8 oz (137.803 kg)  06/25/13 307 lb (139.254 kg)  06/10/13 313 lb 3.2 oz (142.067 kg)     Vital signs reviewed - Note on arrival 02  sats  99% on RA    .     HEENT: nl dentition, turbinates bilaterally, and oropharynx. Nl external ear canals without cough reflex   NECK :  without JVD/Nodes/TM/ nl carotid upstrokes bilaterally   LUNGS: no acc muscle use,  Nl contour chest which is clear to A and P bilaterally without cough on insp or exp maneuvers   CV:  RRR  no s3 or murmur or increase in P2, and no edema   ABD: massively obese/  soft and nontender with limited inspiratory excursion in the supine position. No bruits or organomegaly appreciated, bowel sounds nl  MS:  Nl gait/ ext warm without deformities, calf tenderness, cyanosis or clubbing No obvious joint restrictions   SKIN: warm and dry without lesions    NEURO:  alert, approp, nl sensorium with  no motor or cerebellar deficits apparent.            Assessment & Plan:

## 2016-12-29 ENCOUNTER — Encounter: Payer: Self-pay | Admitting: Internal Medicine

## 2016-12-29 NOTE — Assessment & Plan Note (Addendum)
NO  06/01/2015   =  8 - Spirometry 06/01/2015  No obstruction/ poor effort/ reproducibility  Allergy profile 06/01/2015 >  Eos 0.4 /  IgE  35 neg RAST - trial of singulair 06/01/2015 >>> - Sinus CT 06/07/2015 > No definite sinusitis. Only a minimal amount of debris is noted within the right partition of the sphenoid sinus. 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   These are the same pts (now being labeled as having "irritable larynx syndrome" by some cough centers) who not infrequently have a history of having failed to tolerate ace inhibitors,  dry powder inhalers or biphosphonates or report having atypical/extraesophageal reflux symptoms that don't respond to standard doses of PPI  and are easily confused as having aecopd or asthma flares by even experienced allergists/ pulmonologists (myself included).     Of the three most common causes of  Sub-acute or recurrent or chronic cough, only one (GERD)  can actually contribute to/ trigger  the other two (asthma and post nasal drip syndrome)  and perpetuate the cylce of cough.  While not intuitively obvious, many patients with chronic low grade reflux do not cough until there is a primary insult that disturbs the protective epithelial barrier and exposes sensitive nerve endings.   This is typically viral but can be direct physical injury such  as with an endotracheal tube.   The point is that once this occurs, it is difficult to eliminate the cycle  using anything but a maximally effective acid suppression regimen at least in the short run, accompanied by an appropriate diet to address non acid GERD and control / eliminate the cough itself for at least 3 days using  Flutter valve and if needed tramadol but use should be limited to 3 days.   I had an extended discussion with the patient reviewing all relevant studies completed to date and  lasting 15 to 20 minutes of a 25 minute visit    Each maintenance medication was reviewed in detail including most importantly the difference between maintenance and prns and under what circumstances the prns are to be triggered using an action plan format that is not reflected in the computer generated alphabetically organized AVS.    Please see AVS for specific instructions unique to this visit that I personally wrote and verbalized to the the pt in detail and then reviewed with pt  by my nurse highlighting any  changes in therapy recommended at today's visit to their plan of care.     [name]

## 2016-12-29 NOTE — Assessment & Plan Note (Signed)
Body mass index is 55.62 kg/m.  -  trending up  Lab Results  Component Value Date   TSH 1.05 05/01/2016     Contributing to gerd risk/ doe/reviewed the need and the process to achieve and maintain neg calorie balance > defer f/u primary care including intermittently monitoring thyroid status

## 2017-01-07 ENCOUNTER — Telehealth: Payer: Self-pay | Admitting: Internal Medicine

## 2017-01-07 MED ORDER — TRAMADOL HCL 50 MG PO TABS: 50.0000 mg | ORAL_TABLET | Freq: Four times a day (QID) | ORAL | 0 refills | Status: DC | PRN

## 2017-01-07 MED ORDER — FAMOTIDINE 20 MG PO TABS: ORAL_TABLET | ORAL | 0 refills | Status: DC

## 2017-01-07 NOTE — Telephone Encounter (Signed)
For cough > mucinex dm up 1200 mg every 12 hours and use the flutter valve and if still coughing as per your action action plan ok to tramadol x 3 days up to 2 every 4 hours  Please schedule a follow up office visit in 4 weeks, sooner if needed  with all medications /inhalers/ solutions in hand so we can verify exactly what you are taking. This includes all medications from all doctors and over the counters to See [name] NP  - late add rechallenge with gabapentin 100 tid at next ov if still coughing as not clear she ever took it    Pt is calling an needing a refill of the pepcid and the tramadol sent to the pharmacy in [place], since this is where she is at right now.  MW please advise if you are ok with this refill. Thanks  Allergies  Allergen Reactions  . Sulfonamide Derivatives Anaphylaxis, Swelling, Rash and Other (See Comments)    Swelling tongue and ear   . Doxycycline Nausea And Vomiting  . Latex Hives  . Ciprofloxacin Rash  . Fish Allergy Rash and Other (See Comments)    Only reaction to Mackerel.  No other issues with any type of fish or shellfish currently   . Hydrocortisone Rash  . Neomycin Rash

## 2017-01-07 NOTE — Telephone Encounter (Signed)
Fine to refill both 

## 2017-01-07 NOTE — Telephone Encounter (Signed)
Called and spoke with pt and she is aware of refill that has been sent to the pharmacy.  

## 2017-01-22 ENCOUNTER — Telehealth: Payer: Self-pay | Admitting: Internal Medicine

## 2017-01-22 NOTE — Telephone Encounter (Signed)
Spoke with pt. She is requesting a refill on Tramadol. This was last refilled by MW on 01/07/17 #40.  RA - please advise as MW is not available today. Thanks.

## 2017-01-22 NOTE — Telephone Encounter (Signed)
Spoke with pt. She is aware that we will have to wait until MW comes back.  MW - please advise on pt's Tramadol refill. Thanks.

## 2017-01-22 NOTE — Telephone Encounter (Signed)
Can wait until MW comes back for approval

## 2017-01-23 MED ORDER — TRAMADOL HCL 50 MG PO TABS: 50.0000 mg | ORAL_TABLET | Freq: Four times a day (QID) | ORAL | 0 refills | Status: DC | PRN

## 2017-01-23 NOTE — Telephone Encounter (Signed)
Spoke with pt and advised rx sent to pharmacy. Nothing further is needed.  Pt has an appt with TP on 02/24/2017

## 2017-01-23 NOTE — Telephone Encounter (Signed)
Ok x one but needs ov with all meds in hand before further refills

## 2017-01-23 NOTE — Telephone Encounter (Signed)
Patient is calling about Tramadol, CB is [phone].  She has OV sch with TP for 02/24/17.

## 2017-01-24 ENCOUNTER — Encounter: Payer: Self-pay | Admitting: Gastroenterology

## 2017-01-24 ENCOUNTER — Ambulatory Visit: Payer: Medicare Other | Admitting: Cardiovascular Disease

## 2017-02-05 ENCOUNTER — Other Ambulatory Visit: Payer: Self-pay | Admitting: Cardiology

## 2017-02-07 ENCOUNTER — Other Ambulatory Visit (INDEPENDENT_AMBULATORY_CARE_PROVIDER_SITE_OTHER): Payer: 59

## 2017-02-07 ENCOUNTER — Ambulatory Visit (INDEPENDENT_AMBULATORY_CARE_PROVIDER_SITE_OTHER): Payer: 59 | Admitting: Internal Medicine

## 2017-02-07 ENCOUNTER — Encounter: Payer: Self-pay | Admitting: Internal Medicine

## 2017-02-07 ENCOUNTER — Telehealth: Payer: Self-pay | Admitting: Internal Medicine

## 2017-02-07 VITALS — BP 134/72 | HR 102 | Ht 63.0 in | Wt 312.8 lb

## 2017-02-07 DIAGNOSIS — R05 Cough: Secondary | ICD-10-CM

## 2017-02-07 DIAGNOSIS — R058 Other specified cough: Secondary | ICD-10-CM

## 2017-02-07 DIAGNOSIS — I10 Essential (primary) hypertension: Secondary | ICD-10-CM

## 2017-02-07 LAB — BASIC METABOLIC PANEL
BUN: 14 mg/dL (ref 6–23)
CHLORIDE: 106 meq/L (ref 96–112)
CO2: 25 mEq/L (ref 19–32)
Calcium: 9.2 mg/dL (ref 8.4–10.5)
Creatinine, Ser: 0.92 mg/dL (ref 0.40–1.20)
GFR: 80.57 mL/min (ref >60.00)
GLUCOSE: 162 mg/dL — AB (ref 70–99)
Potassium: 3.8 mEq/L (ref 3.5–5.1)
SODIUM: 139 meq/L (ref 135–145)

## 2017-02-07 MED ORDER — GABAPENTIN 100 MG PO CAPS
100.0000 mg | ORAL_CAPSULE | Freq: Three times a day (TID) | ORAL | 2 refills | Status: DC
Start: 2017-02-07 — End: 2017-06-04

## 2017-02-07 MED ORDER — TRAMADOL HCL 50 MG PO TABS: 50.0000 mg | ORAL_TABLET | Freq: Four times a day (QID) | ORAL | 0 refills | Status: DC | PRN
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
## 2017-02-07 NOTE — Patient Instructions (Addendum)
Gabapentin 100 mg three times a day as per med calendar  Take delsym two tsp every 12 hours and supplement if needed with  tramadol 50 mg up to 2 every 4 hours to suppress the urge to cough. Swallowing water or using ice chips/non mint and menthol containing candies (such as lifesavers or sugarless jolly ranchers) are also effective.  You should rest your voice and avoid activities that you know make you cough.  Once you have eliminated the cough for 3 straight days try reducing the tramadol first,  then the delsym as tolerated.   Please remember to go to the lab department downstairs in the basement  for your tests - we will call you with the results when they are available.      See [name] NP  In 4  weeks with all your medications, even over the counter meds, separated in two separate bags, the ones you take no matter what vs the ones you stop once you feel better and take only as needed when you feel you need them.   [name]  will generate for you a new user friendly medication calendar that will put [place] all on the same page re: your medication use.     Without this process, it simply isn't possible to assure that we are providing  your outpatient care  with  the attention to detail we feel you deserve.   If we cannot assure that you're getting that kind of care,  then we cannot manage your problem effectively from this clinic.  Once you have seen [name] and we are sure that we're all on the same page with your medication use she will arrange follow up with me.    [name]

## 2017-02-07 NOTE — Telephone Encounter (Signed)
Notes recorded by Tanda Rockers, MD on 02/07/2017 at 12:59 PM EST Call patient : Studies are unremarkable, no change in recs ------------------------------ Spoke with pt. She is aware of results. Nothing further was needed.

## 2017-02-09 ENCOUNTER — Encounter: Payer: Self-pay | Admitting: Internal Medicine

## 2017-02-09 NOTE — Assessment & Plan Note (Signed)
Body mass index is 55.41 kg/m.  -  trending down/ encouraged Lab Results  Component Value Date   TSH 1.05 05/01/2016     Contributing to gerd risk/ doe/reviewed the need and the process to achieve and maintain neg calorie balance > defer f/u primary care including intermittently monitoring thyroid status

## 2017-02-09 NOTE — Assessment & Plan Note (Signed)
NO  06/01/2015   =  8 - Spirometry 06/01/2015  No obstruction/ poor effort/ reproducibility  Allergy profile 06/01/2015 >  Eos 0.4 /  IgE  35 neg RAST - trial of singulair 06/01/2015 >>> - Sinus CT 06/07/2015 > No definite sinusitis. Only a minimal amount of debris is noted within the right partition of the sphenoid sinus. - Re CT sinus 05/24/2016 > neg > try gabapentin 100 tid (did not purchase)  - added flutter valve 12/27/2016  - 02/07/2017 rechallenge with gabapentin    DDX of  difficult airways management almost all start with A and  include Adherence, Ace Inhibitors, Acid Reflux, Active Sinus Disease, Alpha 1 Antitripsin deficiency, Anxiety masquerading as Airways dz,  ABPA,  Allergy(esp in young), Aspiration (esp in elderly), Adverse effects of meds,  Active smokers, A bunch of PE's (a small clot burden can't cause this syndrome unless there is already severe underlying pulm or vascular dz with poor reserve) plus two Bs  = Bronchiectasis and Beta blocker use..and one C= CHF  Adherence is always the initial "prime suspect" and is a multilayered concern that requires a "trust but verify" approach in every patient - starting with knowing how to use medications, especially inhalers, correctly, keeping up with refills and understanding the fundamental difference between maintenance and prns vs those medications only taken for a very short course and then stopped and not refilled.  - still not able to achieve full med reconciliation  - return with all meds in hand in 4 weeks  using a trust but verify approach to confirm accurate Medication  Reconciliation The principal here is that until we are certain that the  patients are doing what we've asked, it makes no sense to ask them to do more.   ? Anxiety/ irritable larynx syndrome  > usually at the bottom of this list of usual suspects but should be much higher on this pt's based on H and P > rechallenge with gabapentin     ? Acid (or non-acid) GERD > always  difficult to exclude as up to 75% of pts in some series report no assoc GI/ Heartburn symptoms> rec continue max (24h)  acid suppression and diet restrictions/ reviewed    ? Allergy/ asthma > strongly doubt so continue singulair and prn saba only   ? Chf/ cardiac asthma >  bmet ok/ continue present rx    I had an extended discussion with the patient reviewing all relevant studies completed to date and  lasting 15 to 20 minutes of a 25 minute visit    Each maintenance medication was reviewed in detail including most importantly the difference between maintenance and prns and under what circumstances the prns are to be triggered using an action plan format that is not reflected in the computer generated alphabetically organized AVS but trather by a customized med calendar that reflects the AVS meds with confirmed 100% correlation.   In addition, Please see AVS for unique instructions that I personally wrote and verbalized to the the pt in detail and then reviewed with pt  by my nurse highlighting any  changes in therapy recommended at today's visit to their plan of care.

## 2017-02-09 NOTE — Assessment & Plan Note (Signed)
Lab Results  Component Value Date   CREATININE 0.92 02/07/2017   CREATININE 0.91 07/25/2016   CREATININE 0.83 06/26/2016   CREATININE 0.81 08/25/2015   CREATININE 1.07 (H) 04/24/2015   CREATININE 0.57 11/29/2014     Adequate control on present rx, reviewed in detail with pt > no change in rx needed

## 2017-02-21 ENCOUNTER — Institutional Professional Consult (permissible substitution): Payer: Medicare Other | Admitting: Pulmonary Disease

## 2017-02-23 ENCOUNTER — Emergency Department (HOSPITAL_COMMUNITY)
Admission: EM | Admit: 2017-02-23 | Discharge: 2017-02-23 | Disposition: A | Discharge status: Home / Self Care | Payer: 59 | Attending: Emergency Medicine | Admitting: Emergency Medicine

## 2017-02-23 ENCOUNTER — Encounter (HOSPITAL_COMMUNITY): Payer: Self-pay | Admitting: Emergency Medicine

## 2017-02-23 ENCOUNTER — Emergency Department (HOSPITAL_COMMUNITY): Payer: 59

## 2017-02-23 ENCOUNTER — Other Ambulatory Visit: Payer: Self-pay

## 2017-02-23 DIAGNOSIS — N183 Chronic kidney disease, stage 3 (moderate): Secondary | ICD-10-CM | POA: Insufficient documentation

## 2017-02-23 DIAGNOSIS — Z91040 Latex allergy status: Secondary | ICD-10-CM | POA: Insufficient documentation

## 2017-02-23 DIAGNOSIS — I5032 Chronic diastolic (congestive) heart failure: Secondary | ICD-10-CM | POA: Diagnosis not present

## 2017-02-23 DIAGNOSIS — Z7982 Long term (current) use of aspirin: Secondary | ICD-10-CM | POA: Diagnosis not present

## 2017-02-23 DIAGNOSIS — I2510 Atherosclerotic heart disease of native coronary artery without angina pectoris: Secondary | ICD-10-CM | POA: Diagnosis not present

## 2017-02-23 DIAGNOSIS — R0602 Shortness of breath: Secondary | ICD-10-CM

## 2017-02-23 DIAGNOSIS — R0789 Other chest pain: Secondary | ICD-10-CM | POA: Insufficient documentation

## 2017-02-23 DIAGNOSIS — Z79899 Other long term (current) drug therapy: Secondary | ICD-10-CM | POA: Insufficient documentation

## 2017-02-23 DIAGNOSIS — J449 Chronic obstructive pulmonary disease, unspecified: Secondary | ICD-10-CM | POA: Insufficient documentation

## 2017-02-23 DIAGNOSIS — I130 Hypertensive heart and chronic kidney disease with heart failure and stage 1 through stage 4 chronic kidney disease, or unspecified chronic kidney disease: Secondary | ICD-10-CM | POA: Diagnosis not present

## 2017-02-23 HISTORY — DX: Chronic obstructive pulmonary disease, unspecified: J44.9

## 2017-02-23 LAB — CBC
HEMATOCRIT: 33.9 % — AB (ref 36.0–46.0)
HEMOGLOBIN: 11 g/dL — AB (ref 12.0–15.0)
MCH: 30.4 pg (ref 26.0–34.0)
MCHC: 32.4 g/dL (ref 30.0–36.0)
MCV: 93.6 fL (ref 78.0–100.0)
Platelets: 310 10*3/uL (ref 150–400)
RBC: 3.62 MIL/uL — AB (ref 3.87–5.11)
RDW: 14.8 % (ref 11.5–15.5)
WBC: 14.8 10*3/uL — ABNORMAL HIGH (ref 4.0–10.5)

## 2017-02-23 LAB — BASIC METABOLIC PANEL
ANION GAP: 9 (ref 5–15)
BUN: 14 mg/dL (ref 6–20)
CALCIUM: 9.6 mg/dL (ref 8.9–10.3)
CHLORIDE: 104 mmol/L (ref 101–111)
CO2: 27 mmol/L (ref 22–32)
Creatinine, Ser: 1.06 mg/dL — ABNORMAL HIGH (ref 0.44–1.00)
GFR calc Af Amer: >60 mL/min (ref >60)
GFR calc non Af Amer: 57 mL/min — ABNORMAL LOW (ref >60)
Glucose, Bld: 130 mg/dL — ABNORMAL HIGH (ref 65–99)
POTASSIUM: 4 mmol/L (ref 3.5–5.1)
Sodium: 140 mmol/L (ref 135–145)

## 2017-02-23 LAB — I-STAT TROPONIN, ED: TROPONIN I, POC: 0 ng/mL (ref 0.00–0.08)

## 2017-02-23 MED ORDER — ALBUTEROL SULFATE (2.5 MG/3ML) 0.083% IN NEBU
5.0000 mg | INHALATION_SOLUTION | Freq: Once | RESPIRATORY_TRACT | Status: AC
Start: 2017-02-23 — End: 2017-02-23
  Administered 2017-02-23: 5 mg via RESPIRATORY_TRACT
  Filled 2017-02-23: qty 6

## 2017-02-23 MED ORDER — TORSEMIDE 20 MG PO TABS
40.0000 mg | ORAL_TABLET | Freq: Every day | ORAL | Status: DC
  Filled 2017-02-23: qty 2

## 2017-02-23 NOTE — ED Provider Notes (Signed)
[facility] DEPT Provider Note   CSN: [id number] Arrival date & time: 02/23/17  7106     History   Chief Complaint Chief Complaint  Patient presents with  . Chest Pain  . Shortness of Breath    HPI [name] is a 57 y.o. female.  The history is provided by the patient. No language interpreter was used.  Shortness of Breath  This is a recurrent problem. The problem occurs rarely.The current episode started 6 to 12 hours ago. Pertinent negatives include no fever, no rhinorrhea and no cough. She has tried nothing for the symptoms. The treatment provided no relief. Associated medical issues include COPD and chronic lung disease.  Pt complains of pain in her right chest and some increased shortness of breath.  Pt reports she has a history of copd and chf. Pt does not feel any more swollen than usual.    Past Medical History:  Diagnosis Date  . Acute on chronic diastolic CHF (congestive heart failure) ([place]) 04/11/2014  . Acute suppurative otitis media without spontaneous rupture of eardrum 02/04/2007   Centricity Description: OTITIS MEDIA, SUPPURATIVE, ACUTE, BILATERAL Qualifier: Diagnosis of  By: [name] MD, [name]  Centricity Description: OTITIS MEDIA, SUPPURATIVE, ACUTE Qualifier: Diagnosis of  By: [name] MD, [name]   . Allergic rhinitis   . CAD (coronary artery disease)    LHC (11/29/1999):  EF 60%; no significant CAD.  [name] Carpal tunnel syndrome   . Carpal tunnel syndrome 06/09/2006   Qualifier: Diagnosis of  By: Marca Ancona RMA, Lucy    . Chronic diastolic CHF (congestive heart failure) ([place])    a. Echo (01/21/11): Vigorous LVF, EF 65-70%, no RWMA, Gr 2 DD. b.  Echo (12/15): Moderate LVH, EF 26-94%, grade 2 diastolic dysfunction, mild LAE, normal RV function c. 01/2015: echo showing EF of 60-65% with mild LVH  . Common migraine   . COPD (chronic obstructive pulmonary disease) ([place])   . Diastolic heart failure ([place])   . Diverticulitis   . Hepatic  steatosis   . Hepatomegaly   . Hiatal hernia   . Hyperlipidemia   . Hypertension   . IBS (irritable bowel syndrome)   . Internal hemorrhoids   . Morbid obesity ([place])   . OSA (obstructive sleep apnea)    CPAP dependent  . PNEUMONIA, ORGAN UNSPECIFIED 03/01/2009   Qualifier: Diagnosis of  By: [name] CMA, [name]    . Sinusitis, chronic 03/10/2012   CT sinuses 02/2012:  Chronic sinusitis with small A/F levels >> rx by ENT with prolonged augmentin F/u ct sinuses 04/2012:  Persistent sinus thickening >> rx with levaquin and clinda for 30 days.  Told to return for sinus scan in 6 weeks >> no showed for followup visit.  CT sinuses 11/2013:  Acute and chronic sinusitis noted.      Patient Active Problem List   Diagnosis Date Noted  . CKD (chronic kidney disease) stage 3, GFR 30-59 ml/min ([facility]) 07/08/2016  . Cough variant asthma 07/31/2015  . Upper airway cough syndrome 04/11/2015  . Bronchitis, chronic obstructive w acute bronchitis ([place]) 04/11/2015  . Dyspnea   . Acute on chronic diastolic CHF (congestive heart failure), NYHA class 2 ([place]) 03/27/2015  . Morbid obesity due to excess calories ([place]) 03/27/2015  . Dysphagia 03/27/2015  . Hyponatremia 03/27/2015  . Acute kidney injury ([place]) 03/27/2015  . Hypokalemia 03/27/2015  . Leukocytosis 03/27/2015  . COPD exacerbation ([place]) 03/27/2015  . Sinusitis, chronic 03/10/2012  . Hyperlipidemia 10/02/2006  .  Obstructive sleep apnea 06/09/2006  . Essential hypertension 06/09/2006    Past Surgical History:  Procedure Laterality Date  . ABDOMINAL HYSTERECTOMY    . CESAREAN SECTION    . NASAL SINUS SURGERY    . PARTIAL HYSTERECTOMY    . TONSILLECTOMY    . TUBAL LIGATION    . UVULOPALATOPHARYNGOPLASTY      OB History    No data available       Home Medications    Prior to Admission medications   Medication Sig Start Date End Date Taking? Authorizing Provider  albuterol (PROVENTIL) (2.5 MG/3ML) 0.083% nebulizer solution Take 3 mLs  (2.5 mg total) by nebulization every 4 (four) hours as needed for wheezing or shortness of breath. 11/08/16   Tanda Rockers, MD  Ascorbic Acid (VITAMIN C) 1000 MG tablet Take 1,000 mg by mouth daily.    [provider]  aspirin 81 MG EC tablet TAKE 1 TABLET (81 MG TOTAL) BY MOUTH DAILY. 07/08/16   Strader, Fransisco Hertz, PA-C  chlorpheniramine (CHLOR-TRIMETON) 4 MG tablet Take 2 tablets by mouth at bedtime.  May add 1 extra every 4 hours as needed for drainage, drippy nose, throat clearing    [provider]  Dextromethorphan-Guaifenesin (MUCINEX DM MAXIMUM STRENGTH) 60-1200 MG TB12 Take 1 tablet by mouth 2 (two) times daily as needed (cough).     [provider]  Diclofenac Sodium 3 % GEL Apply 1 application topically 3 (three) times daily. Knee pain    [provider]  dicyclomine (BENTYL) 10 MG capsule TAKE 1 CAPSULE(10 MG) BY MOUTH FOUR TIMES DAILY BEFORE MEALS AND AT BEDTIME 12/04/15   Irene Shipper, MD  diltiazem 2 % GEL Use as directed when needed for hemorroids    [provider]  famotidine (PEPCID) 20 MG tablet One at bedtime 01/07/17   Tanda Rockers, MD  fluticasone (FLONASE) 50 MCG/ACT nasal spray Place 2 sprays into both nostrils daily. 01/08/16   Parrett, Fonnie Mu, NP  gabapentin (NEURONTIN) 100 MG capsule Take 1 capsule (100 mg total) by mouth 3 (three) times daily. One three times daily 02/07/17   Tanda Rockers, MD  hydrALAZINE (APRESOLINE) 25 MG tablet Take 1.5 tablets (37.5 mg total) by mouth 3 (three) times daily. 07/08/16 02/07/17  Strader, Fransisco Hertz, PA-C  isosorbide dinitrate (ISORDIL) 20 MG tablet Take 1 tablet (20 mg total) by mouth 3 (three) times daily. 07/08/16   Strader, Fransisco Hertz, PA-C  losartan (COZAAR) 100 MG tablet Take 100 mg by mouth as directed. Take 1/2 tablet by mouth daily    [provider]  metolazone (ZAROXOLYN) 2.5 MG tablet Take 2.5 mg by mouth daily as needed (leg swelling, weight gain).    [provider]  montelukast (SINGULAIR) 10 MG tablet Take 1 tablet (10 mg total) by mouth at bedtime. 10/15/16   Juanito Doom, MD  pantoprazole (PROTONIX) 40 MG tablet TAKE 1 TABLET BY MOUTH DAILY, TAKE 30 TO 60 MINUTES BEFORE FIRST MEAL OF THE DAY 11/07/15   Tanda Rockers, MD  polyethylene glycol powder (GLYCOLAX/MIRALAX) powder MIX 2 CAPFULS IN MORNING WITH LIQUID AND MIX ONE CAPFUL IN THE EVENING 12/17/16   Willia Craze, NP  potassium chloride SA (K-DUR,KLOR-CON) 20 MEQ tablet TAKE 3 TABLETS BY MOUTH THREE TIMES DAILY 02/05/17   Minus Breeding, MD  Respiratory Therapy Supplies (FLUTTER) DEVI 1 application by Does not apply route as directed. 12/27/16   Tanda Rockers, MD  spironolactone (ALDACTONE) 25  MG tablet TAKE 0.5 TABLETS (12.5 MG TOTAL) BY MOUTH 2 (TWO) TIMES DAILY. 10/22/16   Skeet Latch, MD  topiramate (TOPAMAX) 25 MG tablet Take 25 mg by mouth 2 (two) times daily as needed (for headaches).  03/03/15   [provider]  torsemide (DEMADEX) 20 MG tablet TAKE 2 TABLETS BY MOUTH 2 TIMES DAILY 11/20/16   Skeet Latch, MD  traMADol (ULTRAM) 50 MG tablet Take 1 tablet (50 mg total) by mouth every 6 (six) hours as needed for moderate pain. 02/07/17   Tanda Rockers, MD    Family History Family History  Problem Relation Age of Onset  . Heart disease Mother   . Heart attack Mother   . Hypertension Mother   . Coronary artery disease Unknown        1st degree relatvie <50  . Breast cancer Unknown        aunt- ? paternal or maternal  . Asthma Sister   . Stroke Neg Hx     Social History Social History   Tobacco Use  . Smoking status: Never Smoker  . Smokeless tobacco: Never Used  Substance Use Topics  . Alcohol use: No  . Drug use: No     Allergies   Sulfonamide derivatives; Doxycycline; Latex; Ciprofloxacin; Fish allergy; Hydrocortisone; and Neomycin   Review of Systems Review of Systems  Constitutional: Negative for fever.  HENT: Negative for  rhinorrhea.   Respiratory: Positive for shortness of breath. Negative for cough.   All other systems reviewed and are negative.    Physical Exam Updated Vital Signs BP 139/82 (BP Location: Left Arm)   Pulse 98   Temp 98 F (36.7 C) (Oral)   Resp 17   SpO2 95%   Physical Exam  Constitutional: She appears well-developed and well-nourished.  HENT:  Head: Normocephalic.  Eyes: Pupils are equal, round, and reactive to light.  Neck: Normal range of motion. Neck supple.  Cardiovascular: Normal rate, regular rhythm and normal pulses.  Pulmonary/Chest: Effort normal.  Abdominal: Soft.  Musculoskeletal: Normal range of motion.  Skin: Skin is warm.  Nursing note and vitals reviewed.    ED Treatments / Results  Labs (all labs ordered are listed, but only abnormal results are displayed) Labs Reviewed  BASIC METABOLIC PANEL - Abnormal; Notable for the following components:      Result Value   Glucose, Bld 130 (*)    Creatinine, Ser 1.06 (*)    GFR calc non Af Amer 57 (*)    All other components within normal limits  CBC - Abnormal; Notable for the following components:   WBC 14.8 (*)    RBC 3.62 (*)    Hemoglobin 11.0 (*)    HCT 33.9 (*)    All other components within normal limits  I-STAT TROPONIN, ED    EKG  EKG Interpretation None       Radiology Dg Chest 2 View  Result Date: 02/23/2017 CLINICAL DATA:  Left chest pain and shortness of breath. History of CHF. Nonsmoker. EXAM: CHEST  2 VIEW COMPARISON:  06/26/2016 FINDINGS: Shallow inspiration. Cardiac enlargement. No vascular congestion, edema, or consolidation. No blunting of costophrenic angles. No pneumothorax. Mediastinal contours appear intact. Degenerative changes in the spine. IMPRESSION: Cardiac enlargement.  No evidence of active pulmonary disease. Electronically Signed   By: Lucienne Capers M.D.   On: 02/23/2017 05:12    Procedures Procedures (including critical care time)  Medications Ordered in  ED Medications - No data to display  Initial Impression / Assessment and Plan / ED Course  I have reviewed the triage vital signs and the nursing notes.  Pertinent labs & imaging results that were available during my care of the patient were reviewed by me and considered in my medical decision making (see chart for details).     Pt refused medication.  Pt refused IV.  Pt did not stay for evaluation   Final Clinical Impressions(s) / ED Diagnoses   Final diagnoses:  Shortness of breath    ED Discharge Orders    None       Sidney Ace 02/23/17 1503    Molpus, Jenny Reichmann, MD 03/03/17 2323

## 2017-02-23 NOTE — ED Notes (Signed)
Pt. REFUSED for IV access.

## 2017-02-23 NOTE — ED Notes (Signed)
Pt. ambulated to bathroom without difficulty , no complaint of sob.

## 2017-02-23 NOTE — ED Notes (Signed)
Opened chart to review with PA

## 2017-02-23 NOTE — ED Triage Notes (Signed)
Pt states about 1 am she started having pain in the right side of her chest and having shortness of breath  Pt states she has sleep apnea and when she put her CPAP machine on it felt like it was choking her   Pt states the pain is like a pulling type pain

## 2017-02-24 ENCOUNTER — Encounter: Payer: Medicare Other | Admitting: Adult Health

## 2017-02-25 ENCOUNTER — Other Ambulatory Visit: Payer: Self-pay | Admitting: Internal Medicine

## 2017-02-25 ENCOUNTER — Telehealth: Payer: Self-pay | Admitting: Internal Medicine

## 2017-02-25 MED ORDER — TRAMADOL HCL 50 MG PO TABS: 50.0000 mg | ORAL_TABLET | Freq: Four times a day (QID) | ORAL | 0 refills | Status: DC | PRN

## 2017-02-25 NOTE — Telephone Encounter (Signed)
Rx for Tramadol 50mg  has been phoned in to [facility] Pt is aware and voiced his understanding. Nothing further is needed.

## 2017-02-25 NOTE — Telephone Encounter (Signed)
Called and spoke with pt.  Pt is requesting refill on Tramadol 50mg . Pt states she has 1 tablet left and she takes BID.  Tramadol 50mg  last refilled on 02/07/17 #40 with 0 refills.  Pt last seen 02/07/17 with upcoming apt on 03/07/17.  MW please advise. Thanks  02/07/17 AVS  Gabapentin 100 mg three times a day as per med calendar  Take delsym two tsp every 12 hours and supplement if needed with  tramadol 50 mg up to 2 every 4 hours to suppress the urge to cough. Swallowing water or using ice chips/non mint and menthol containing candies (such as lifesavers or sugarless jolly ranchers) are also effective.  You should rest your voice and avoid activities that you know make you cough.  Once you have eliminated the cough for 3 straight days try reducing the tramadol first,  then the delsym as tolerated.   Please remember to go to the lab department downstairs in the basement  for your tests - we will call you with the results when they are available.      See [name] NP  In 4  weeks with all your medications, even over the counter meds, separated in two separate bags, the ones you take no matter what vs the ones you stop once you feel better and take only as needed when you feel you need them.   [name]  will generate for you a new user friendly medication calendar that will put [place] all on the same page re: your medication use.     Without this process, it simply isn't possible to assure that we are providing  your outpatient care  with  the attention to detail we feel you deserve.   If we cannot assure that you're getting that kind of care,  then we cannot manage your problem effectively from this clinic.  Once you have seen [name] and we are sure that we're all on the same page with your medication use she will arrange follow up with me.    [name]

## 2017-02-25 NOTE — Telephone Encounter (Signed)
Ok to refill x one but be sure to keep appt to see [name] with all meds in hand because we are trying to wean her off the tramadol

## 2017-02-26 ENCOUNTER — Other Ambulatory Visit: Payer: Self-pay | Admitting: Internal Medicine

## 2017-02-27 ENCOUNTER — Encounter: Payer: Self-pay | Admitting: Cardiovascular Disease

## 2017-02-27 ENCOUNTER — Other Ambulatory Visit: Payer: Self-pay | Admitting: Internal Medicine

## 2017-02-27 ENCOUNTER — Ambulatory Visit (INDEPENDENT_AMBULATORY_CARE_PROVIDER_SITE_OTHER): Payer: 59 | Admitting: Cardiovascular Disease

## 2017-02-27 VITALS — BP 160/76 | HR 107 | Ht 63.0 in | Wt 295.0 lb

## 2017-02-27 DIAGNOSIS — I5032 Chronic diastolic (congestive) heart failure: Secondary | ICD-10-CM | POA: Diagnosis not present

## 2017-02-27 DIAGNOSIS — R0609 Other forms of dyspnea: Secondary | ICD-10-CM

## 2017-02-27 DIAGNOSIS — I10 Essential (primary) hypertension: Secondary | ICD-10-CM

## 2017-02-27 DIAGNOSIS — R000 Tachycardia, unspecified: Secondary | ICD-10-CM | POA: Diagnosis not present

## 2017-02-27 MED ORDER — CARVEDILOL 12.5 MG PO TABS: 12.5000 mg | ORAL_TABLET | Freq: Two times a day (BID) | ORAL | 3 refills | Status: DC
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
## 2017-02-27 NOTE — Patient Instructions (Addendum)
Medication Instructions:  START CARVEDILOL 12.5 MG TWICE A DAY   Labwork: NONE  Testing/Procedures: Your physician has requested that you have an echocardiogram. Echocardiography is a painless test that uses sound waves to create images of your heart. It provides your doctor with information about the size and shape of your heart and how well your heart's chambers and valves are working. This procedure takes approximately one hour. There are no restrictions for this procedure. [place] [address]  Your physician has requested that you have a lexiscan myoview. For further information please visit [email or web address]. Please follow instruction sheet, as given. 2 DAY   Follow-Up: Your physician recommends that you schedule a follow-up appointment in: [place]  Your physician recommends that you schedule a follow-up appointment in: 4 MONTHS WITH DR [facility]   Echocardiogram An echocardiogram, or echocardiography, uses sound waves (ultrasound) to produce an image of your heart. The echocardiogram is simple, painless, obtained within a short period of time, and offers valuable information to your health care provider. The images from an echocardiogram can provide information such as:  Evidence of coronary artery disease (CAD).  Heart size.  Heart muscle function.  Heart valve function.  Aneurysm detection.  Evidence of a past heart attack.  Fluid buildup around the heart.  Heart muscle thickening.  Assess heart valve function.  Tell a health care provider about:  Any allergies you have.  All medicines you are taking, including vitamins, herbs, eye drops, creams, and over-the-counter medicines.  Any problems you or family members have had with anesthetic medicines.  Any blood disorders you have.  Any surgeries you have had.  Any medical conditions you have.  Whether you are pregnant or may be pregnant. What happens before the  procedure? No special preparation is needed. Eat and drink normally. What happens during the procedure?  In order to produce an image of your heart, gel will be applied to your chest and a wand-like tool (transducer) will be moved over your chest. The gel will help transmit the sound waves from the transducer. The sound waves will harmlessly bounce off your heart to allow the heart images to be captured in real-time motion. These images will then be recorded.  You may need an IV to receive a medicine that improves the quality of the pictures. What happens after the procedure? You may return to your normal schedule including diet, activities, and medicines, unless your health care provider tells you otherwise. This information is not intended to replace advice given to you by your health care provider. Make sure you discuss any questions you have with your health care provider. Document Released: 01/05/2000 Document Revised: 08/26/2015 Document Reviewed: 09/14/2012 Elsevier Interactive Patient Education  2017 [employer].  Cardiac Nuclear Scan A cardiac nuclear scan is a test that measures blood flow to the heart when a person is resting and when he or she is exercising. The test looks for problems such as:  Not enough blood reaching a portion of the heart.  The heart muscle not working normally.  You may need this test if:  You have heart disease.  You have had abnormal lab results.  You have had heart surgery or angioplasty.  You have chest pain.  You have shortness of breath.  In this test, a radioactive dye (tracer) is injected into your bloodstream. After the tracer has traveled to your heart, an imaging device is used to measure how much of the tracer  is absorbed by or distributed to various areas of your heart. This procedure is usually done at a hospital and takes 2-4 hours. Tell a health care provider about:  Any allergies you have.  All medicines you are taking,  including vitamins, herbs, eye drops, creams, and over-the-counter medicines.  Any problems you or family members have had with the use of anesthetic medicines.  Any blood disorders you have.  Any surgeries you have had.  Any medical conditions you have.  Whether you are pregnant or may be pregnant. What are the risks? Generally, this is a safe procedure. However, problems may occur, including:  Serious chest pain and heart attack. This is only a risk if the stress portion of the test is done.  Rapid heartbeat.  Sensation of warmth in your chest. This usually passes quickly.  What happens before the procedure?  Ask your health care provider about changing or stopping your regular medicines. This is especially important if you are taking diabetes medicines or blood thinners.  Remove your jewelry on the day of the procedure. What happens during the procedure?  An IV tube will be inserted into one of your veins.  Your health care provider will inject a small amount of radioactive tracer through the tube.  You will wait for 20-40 minutes while the tracer travels through your bloodstream.  Your heart activity will be monitored with an electrocardiogram (ECG).  You will lie down on an exam table.  Images of your heart will be taken for about 15-20 minutes.  You may be asked to exercise on a treadmill or stationary bike. While you exercise, your heart's activity will be monitored with an ECG, and your blood pressure will be checked. If you are unable to exercise, you may be given a medicine to increase blood flow to parts of your heart.  When blood flow to your heart has peaked, a tracer will again be injected through the IV tube.  After 20-40 minutes, you will get back on the exam table and have more images taken of your heart.  When the procedure is over, your IV tube will be removed. The procedure may vary among health care providers and hospitals. Depending on the type of  tracer used, scans may need to be repeated 3-4 hours later. What happens after the procedure?  Unless your health care provider tells you otherwise, you may return to your normal schedule, including diet, activities, and medicines.  Unless your health care provider tells you otherwise, you may increase your fluid intake. This will help flush the contrast dye from your body. Drink enough fluid to keep your urine clear or pale yellow.  It is up to you to get your test results. Ask your health care provider, or the department that is doing the test, when your results will be ready. Summary  A cardiac nuclear scan measures the blood flow to the heart when a person is resting and when he or she is exercising.  You may need this test if you are at risk for heart disease.  Tell your health care provider if you are pregnant.  Unless your health care provider tells you otherwise, increase your fluid intake. This will help flush the contrast dye from your body. Drink enough fluid to keep your urine clear or pale yellow. This information is not intended to replace advice given to you by your health care provider. Make sure you discuss any questions you have with your health care provider. Document Released:  02/02/2004 Document Revised: 01/10/2016 Document Reviewed: 12/16/2012 Elsevier Interactive Patient Education  2017 [employer].

## 2017-03-03 ENCOUNTER — Ambulatory Visit (HOSPITAL_COMMUNITY): Payer: 59 | Attending: Cardiology

## 2017-03-03 ENCOUNTER — Other Ambulatory Visit: Payer: Self-pay

## 2017-03-03 ENCOUNTER — Other Ambulatory Visit: Payer: Self-pay | Admitting: Cardiology

## 2017-03-03 DIAGNOSIS — I110 Hypertensive heart disease with heart failure: Secondary | ICD-10-CM | POA: Insufficient documentation

## 2017-03-03 DIAGNOSIS — R000 Tachycardia, unspecified: Secondary | ICD-10-CM | POA: Insufficient documentation

## 2017-03-03 DIAGNOSIS — G4733 Obstructive sleep apnea (adult) (pediatric): Secondary | ICD-10-CM | POA: Insufficient documentation

## 2017-03-03 DIAGNOSIS — R5383 Other fatigue: Secondary | ICD-10-CM | POA: Diagnosis not present

## 2017-03-03 DIAGNOSIS — R0609 Other forms of dyspnea: Secondary | ICD-10-CM | POA: Insufficient documentation

## 2017-03-03 DIAGNOSIS — I509 Heart failure, unspecified: Secondary | ICD-10-CM | POA: Diagnosis not present

## 2017-03-03 DIAGNOSIS — I2510 Atherosclerotic heart disease of native coronary artery without angina pectoris: Secondary | ICD-10-CM | POA: Diagnosis not present

## 2017-03-03 DIAGNOSIS — M7989 Other specified soft tissue disorders: Secondary | ICD-10-CM | POA: Diagnosis not present

## 2017-03-03 DIAGNOSIS — E785 Hyperlipidemia, unspecified: Secondary | ICD-10-CM | POA: Insufficient documentation

## 2017-03-03 DIAGNOSIS — J449 Chronic obstructive pulmonary disease, unspecified: Secondary | ICD-10-CM | POA: Insufficient documentation

## 2017-03-03 MED ORDER — PERFLUTREN LIPID MICROSPHERE
1.0000 mL | INTRAVENOUS | Status: AC | PRN
  Administered 2017-03-03: 5 mL via INTRAVENOUS

## 2017-03-03 NOTE — Telephone Encounter (Signed)
REFILL 

## 2017-03-05 ENCOUNTER — Other Ambulatory Visit: Payer: Self-pay | Admitting: Adult Health

## 2017-03-05 ENCOUNTER — Ambulatory Visit (INDEPENDENT_AMBULATORY_CARE_PROVIDER_SITE_OTHER): Payer: 59 | Admitting: Pulmonary Disease

## 2017-03-05 ENCOUNTER — Encounter: Payer: Self-pay | Admitting: Pulmonary Disease

## 2017-03-05 VITALS — BP 122/72 | HR 82 | Ht 63.0 in | Wt 295.0 lb

## 2017-03-05 DIAGNOSIS — G4733 Obstructive sleep apnea (adult) (pediatric): Secondary | ICD-10-CM

## 2017-03-05 DIAGNOSIS — J019 Acute sinusitis, unspecified: Secondary | ICD-10-CM | POA: Diagnosis not present

## 2017-03-05 MED ORDER — AMOXICILLIN-POT CLAVULANATE 875-125 MG PO TABS: 1.0000 | ORAL_TABLET | Freq: Two times a day (BID) | ORAL | 0 refills | Status: DC

## 2017-03-05 NOTE — Progress Notes (Addendum)
[facility]  Chief Complaint  Patient presents with  . sleep consult    Pt referred by MW, former [facility] patient.Pt has cpap machine with [facility]. Pt has productive cough- green and some blood at pea size. Pt has SOB, wheezing with exertion.     Vital signs: BP 122/72 (BP Location: Left Arm, Cuff Size: Normal)   Pulse 82   Ht 5\' 3"  (1.6 m)   Wt 295 lb (133.8 kg)   SpO2 99%   BMI 52.26 kg/m   History of Present Illness: [name] is a 57 y.o. female OSA and chronic bronchitis.  She has sleep study years ago.  Was on CPAP and then changed to Bipap.  Reports getting a new machine and working well.  No issue with mask fit.  She has cough with yellow sputum.  Has some blood streaks.  Also having sinus congestion.  Took augmentin for a week.  This helped some, but she usually needs two weeks of Abx.   Physical Exam:  General - pleasant Eyes - pupils reactive ENT - no sinus tenderness, no oral exudate, no LAN Cardiac - regular, no murmur Chest - no wheeze, rales Abd - soft, non tender Ext - no edema Skin - no rashes Neuro - normal strength Psych - normal mood   Assessment/Plan:  Acute bronchitis with sinusitis. - will give additional course of augmentin  Obstructive sleep apnea. - she is compliant with Bipap and reports benefit - continue Bipap 20/14 cm H2O  Obesity. - discussed importance of weight loss  Upper airway cough syndrome. - flonase, singulair - prn albuterol   Patient Instructions  Augmentin one pill twice per day  Will arrange for overnight oxygen test with you using Bipap  Follow up in 6 months    Chesley Mires, MD [place] 03/05/2017, 12:53 PM Pager:  [phone]  Flow Sheet  Pulmonary tests:  Sleep tests: Bipap 02/03/17 to 03/04/17 >> used on 30 of 30 nights with average 9 hrs 51 min.  Average AHI 0.5 with Bipap 20/14 cm H2O  Cardiac tests: Echo 03/03/17 >> EF 65 to 70%,  grade 1 DD  Review of Systems: Constitutional: Negative for fever and unexpected weight change.  HENT: Positive for congestion, postnasal drip, sore throat and trouble swallowing. Negative for dental problem, ear pain, nosebleeds, rhinorrhea, sinus pressure and sneezing.   Eyes: Positive for itching. Negative for redness.  Respiratory: Positive for cough, shortness of breath and wheezing. Negative for chest tightness.   Cardiovascular: Positive for palpitations. Negative for leg swelling.  Gastrointestinal: Negative for nausea and vomiting.  Genitourinary: Negative for dysuria.  Musculoskeletal: Negative for joint swelling.  Skin: Negative for rash.  Allergic/Immunologic: Positive for environmental allergies. Negative for food allergies and immunocompromised state.  Neurological: Positive for headaches.  Hematological: Bruises/bleeds easily.  Psychiatric/Behavioral: Negative for dysphoric mood. The patient is not nervous/anxious.    Past Medical History: She  has a past medical history of Acute on chronic diastolic CHF (congestive heart failure) ([place]) (04/11/2014), Acute suppurative otitis media without spontaneous rupture of eardrum (02/04/2007), Allergic rhinitis, CAD (coronary artery disease), Carpal tunnel syndrome, Carpal tunnel syndrome (06/09/2006), Chronic diastolic CHF (congestive heart failure) ([place]), Common migraine, COPD (chronic obstructive pulmonary disease) ([place]), Diastolic heart failure ([place]), Diverticulitis, Hepatic steatosis, Hepatomegaly, Hiatal hernia, Hyperlipidemia, Hypertension, IBS (irritable bowel syndrome), Internal hemorrhoids, Morbid obesity ([place]), OSA (obstructive sleep apnea), PNEUMONIA, ORGAN UNSPECIFIED (03/01/2009), and Sinusitis, chronic (03/10/2012).  Past Surgical History: She  has a past surgical history that includes Tubal ligation; Tonsillectomy; Uvulopalatopharyngoplasty; Cesarean section; Nasal sinus surgery; Partial hysterectomy; and Abdominal  hysterectomy.  Family History: Her family history includes Asthma in her sister; Breast cancer in her unknown relative; Coronary artery disease in her unknown relative; Heart attack in her mother; Heart disease in her mother; Hypertension in her mother.  Social History: She  reports that  has never smoked. she has never used smokeless tobacco. She reports that she does not drink alcohol or use drugs.  Medications: Allergies as of 03/05/2017      Reactions   Sulfonamide Derivatives Anaphylaxis, Swelling, Rash, Other (See Comments)   Swelling tongue and ear    Doxycycline Nausea And Vomiting   Latex Hives   Ciprofloxacin Rash   Fish Allergy Rash, Other (See Comments)   Only reaction to Mackerel.  No other issues with any type of fish or shellfish currently    Hydrocortisone Rash   Neomycin Rash      Medication List        Accurate as of 03/05/17 12:53 PM. Always use your most recent med list.          albuterol (2.5 MG/3ML) 0.083% nebulizer solution Commonly known as:  PROVENTIL Take 3 mLs (2.5 mg total) by nebulization every 4 (four) hours as needed for wheezing or shortness of breath.   amoxicillin-clavulanate 875-125 MG tablet Commonly known as:  AUGMENTIN Take 1 tablet by mouth 2 (two) times daily.   aspirin 81 MG EC tablet TAKE 1 TABLET (81 MG TOTAL) BY MOUTH DAILY.   benzonatate 200 MG capsule Commonly known as:  TESSALON TAKE 1 CAPSULE(200 MG) BY MOUTH THREE TIMES DAILY AS NEEDED FOR COUGH   carvedilol 12.5 MG tablet Commonly known as:  COREG Take 1 tablet (12.5 mg total) by mouth 2 (two) times daily.   chlorpheniramine 4 MG tablet Commonly known as:  CHLOR-TRIMETON Take 2 tablets by mouth at bedtime.  May add 1 extra every 4 hours as needed for drainage, drippy nose, throat clearing   Diclofenac Sodium 3 % Gel Apply 1 application topically 3 (three) times daily. Knee pain   dicyclomine 10 MG capsule Commonly known as:  BENTYL TAKE 1 CAPSULE(10 MG) BY  MOUTH FOUR TIMES DAILY BEFORE MEALS AND AT BEDTIME   diltiazem 2 % Gel Use as directed when needed for hemorroids   famotidine 20 MG tablet Commonly known as:  PEPCID TAKE 1 TABLET BY MOUTH EVERY NIGHT AT BEDTIME   fluticasone 50 MCG/ACT nasal spray Commonly known as:  FLONASE Place 2 sprays into both nostrils daily.   FLUTTER Devi 1 application by Does not apply route as directed.   gabapentin 100 MG capsule Commonly known as:  NEURONTIN Take 1 capsule (100 mg total) by mouth 3 (three) times daily. One three times daily   hydrALAZINE 25 MG tablet Commonly known as:  APRESOLINE Take 1.5 tablets (37.5 mg total) by mouth 3 (three) times daily.   isosorbide dinitrate 20 MG tablet Commonly known as:  ISORDIL Take 1 tablet (20 mg total) by mouth 3 (three) times daily.   losartan 100 MG tablet Commonly known as:  COZAAR Take 100 mg by mouth as directed. Take 1/2 tablet by mouth daily   metolazone 2.5 MG tablet Commonly known as:  ZAROXOLYN Take 2.5 mg by mouth daily as needed (leg swelling, weight gain).   montelukast 10 MG tablet Commonly known as:  SINGULAIR Take 1 tablet (10 mg total) by mouth at bedtime.  MUCINEX DM MAXIMUM STRENGTH 60-1200 MG Tb12 Take 1 tablet by mouth 2 (two) times daily as needed (cough).   pantoprazole 40 MG tablet Commonly known as:  PROTONIX TAKE 1 TABLET BY MOUTH DAILY, TAKE 30 TO 60 MINUTES BEFORE FIRST MEAL OF THE DAY   polyethylene glycol powder powder Commonly known as:  GLYCOLAX/MIRALAX MIX 2 CAPFULS IN MORNING WITH LIQUID AND MIX ONE CAPFUL IN THE EVENING   potassium chloride SA 20 MEQ tablet Commonly known as:  K-DUR,KLOR-CON TAKE 3 TABLETS BY MOUTH THREE TIMES DAILY   spironolactone 25 MG tablet Commonly known as:  ALDACTONE TAKE 0.5 TABLETS (12.5 MG TOTAL) BY MOUTH 2 (TWO) TIMES DAILY.   topiramate 25 MG tablet Commonly known as:  TOPAMAX Take 25 mg by mouth 2 (two) times daily as needed (for headaches).   torsemide 20 MG  tablet Commonly known as:  DEMADEX TAKE 2 TABLETS BY MOUTH 2 TIMES DAILY   traMADol 50 MG tablet Commonly known as:  ULTRAM Take 1 tablet (50 mg total) by mouth every 6 (six) hours as needed for moderate pain.   vitamin C 1000 MG tablet Take 1,000 mg by mouth daily.

## 2017-03-05 NOTE — Progress Notes (Signed)
   Subjective:    Patient ID: [name], female    DOB: 1960/12/28, 57 y.o.   MRN: [id number]  HPI    Review of Systems  Constitutional: Negative for fever and unexpected weight change.  HENT: Positive for congestion, postnasal drip, sore throat and trouble swallowing. Negative for dental problem, ear pain, nosebleeds, rhinorrhea, sinus pressure and sneezing.   Eyes: Positive for itching. Negative for redness.  Respiratory: Positive for cough, shortness of breath and wheezing. Negative for chest tightness.   Cardiovascular: Positive for palpitations. Negative for leg swelling.  Gastrointestinal: Negative for nausea and vomiting.  Genitourinary: Negative for dysuria.  Musculoskeletal: Negative for joint swelling.  Skin: Negative for rash.  Allergic/Immunologic: Positive for environmental allergies. Negative for food allergies and immunocompromised state.  Neurological: Positive for headaches.  Hematological: Bruises/bleeds easily.  Psychiatric/Behavioral: Negative for dysphoric mood. The patient is not nervous/anxious.        Objective:   Physical Exam        Assessment & Plan:

## 2017-03-05 NOTE — Patient Instructions (Signed)
Augmentin one pill twice per day  Will arrange for overnight oxygen test with you using Bipap  Follow up in 6 months

## 2017-03-06 ENCOUNTER — Telehealth (HOSPITAL_COMMUNITY): Payer: Self-pay

## 2017-03-06 NOTE — Telephone Encounter (Signed)
Encounter complete. 

## 2017-03-07 ENCOUNTER — Telehealth: Payer: Self-pay | Admitting: Pulmonary Disease

## 2017-03-07 ENCOUNTER — Encounter: Payer: 59 | Admitting: Adult Health

## 2017-03-07 NOTE — Telephone Encounter (Signed)
[facility] please advise, patient states that [facility] told her no order has been sent but I see where the order was placed on 2.13.19 with Dr. [name].

## 2017-03-07 NOTE — Telephone Encounter (Signed)
Spoke to patient made her aware that [facility] has the order spoke to [place] to confirm

## 2017-03-10 ENCOUNTER — Telehealth: Payer: Self-pay | Admitting: Cardiovascular Disease

## 2017-03-11 ENCOUNTER — Ambulatory Visit (HOSPITAL_COMMUNITY)
Admission: RE | Admit: 2017-03-11 | Payer: 59 | Source: Ambulatory Visit | Attending: Cardiovascular Disease | Admitting: Cardiovascular Disease

## 2017-03-12 ENCOUNTER — Encounter: Payer: Self-pay | Admitting: Pulmonary Disease

## 2017-03-12 ENCOUNTER — Ambulatory Visit (HOSPITAL_COMMUNITY)
Admission: RE | Admit: 2017-03-12 | Payer: 59 | Source: Ambulatory Visit | Attending: Cardiovascular Disease | Admitting: Cardiovascular Disease

## 2017-03-14 ENCOUNTER — Other Ambulatory Visit: Payer: Self-pay | Admitting: Cardiovascular Disease

## 2017-03-14 NOTE — Telephone Encounter (Signed)
Rx(s) sent to pharmacy electronically.  

## 2017-03-14 NOTE — Telephone Encounter (Signed)
New Message     *STAT* If patient is at the pharmacy, call can be transferred to refill team.   1. Which medications need to be refilled? (please list name of each medication and dose if known) torsemide (DEMADEX) 20 MG tablet    2. Which pharmacy/location (including street and city if local pharmacy) is medication to be sent to? [place]   3. Do they need a 30 day or 90 day supply? 90 day

## 2017-03-16 ENCOUNTER — Other Ambulatory Visit: Payer: Self-pay | Admitting: Nurse Practitioner

## 2017-03-17 ENCOUNTER — Other Ambulatory Visit: Payer: Self-pay | Admitting: Internal Medicine

## 2017-03-18 NOTE — Telephone Encounter (Signed)
Patient calling in requesting refill. Please send Rx to [facility] on [address].

## 2017-03-19 ENCOUNTER — Telehealth: Payer: Self-pay | Admitting: Pulmonary Disease

## 2017-03-19 ENCOUNTER — Telehealth: Payer: Self-pay | Admitting: Cardiovascular Disease

## 2017-03-19 DIAGNOSIS — G4733 Obstructive sleep apnea (adult) (pediatric): Secondary | ICD-10-CM

## 2017-03-19 NOTE — Telephone Encounter (Signed)
Spoke with patient. She refused the appt with MW. She wanted to know the results of her ONO test. Advised patient of her results, she wishes to proceed with the bipap titration study. Patient wants this to be scheduled as soon as possible.   Nothing else needed at time of call.

## 2017-03-19 NOTE — Telephone Encounter (Signed)
Spoke with patient and reviewed recommendations. Her blood pressure was up to 121/56. Discussed with Claiborne Billings D guidelines for medications tonight. Will have patient decrease the Hydralazine to 25 mg and hold if feeling dizzy/lightheaded or SBP less than 100. Reviewed with patient, verbalized understanding.

## 2017-03-19 NOTE — Telephone Encounter (Signed)
Spoke with patient and she has checked her blood pressure 3 times. Stated she did use large cuff for readings. Reviewed her medications and she has had her Carvedilol 12.5 mg, Hydralazine 25 mg 1 &1/2 tablets, Isordil 20 mg, Losartan 100 mg 1/2 tablet, Spironolactone 25 mg 1/2 tablet, and Torsemide 20 mg 2 tablets this morning. She has not had her mid day doses of Isordil or Hydralazine. advised not to take until she heard back from me. No new medications started since last ov. Stated she was feeling some better. Will forward to Dr Oval Linsey for review

## 2017-03-19 NOTE — Telephone Encounter (Signed)
Advised patient, verbalized understanding. Per discussion with Dr Oval Linsey prior to blood pressure readings, get nuclear stress test as previously recommended. Patient canceled last week secondary to URI

## 2017-03-19 NOTE — Telephone Encounter (Signed)
Follow up   Pt is calling with more questions regarding her symptoms. Please call

## 2017-03-19 NOTE — Telephone Encounter (Signed)
Agee hold both.  Only take if BP is >140/90.  Reduced hydralazine to 25mg  once BP comes back up.

## 2017-03-19 NOTE — Telephone Encounter (Signed)
Spoke with pt. States that she is not feeling well. Reports "feeling hot", SOB, lightheaded and blurred vision. These symptoms started after she ate BBQ with her son yesterday. She called 911 and was evaluated, her vital signs were normal. When she becomes short of breath she puts her CPAP on and gets minimal relief. Pt had another episode these symptoms this morning after taking her shower. Pt would like Dr. Gustavus Bryant recommendations.  Dr. [name] - please Arnoldo Hooker. Thanks.

## 2017-03-19 NOTE — Telephone Encounter (Signed)
Spoke with patient regarding another issue. She was advised of her results. Patient wishes to proceed with the bipap titration and wishes to have this scheduled for as soon as possible. Advised patient that I would place the order today for her and one of the PCCs or the sleep lab will call her to get scheduled.   Patient verbalized understanding. Nothing else needed at time of call.

## 2017-03-19 NOTE — Telephone Encounter (Signed)
Follow up    Pt c/o BP issue:  1. What are your last 5 BP readings? 95/66, 97/55, 103/55 (patient states these are her readings from today) 2. Are you having any other symptoms (ex. Dizziness, headache, blurred vision, passed out)? lightheadness and dizziness 3. What is your medication issue? None    Patient wants to know if her BP is considered low or high. Please call to discuss.

## 2017-03-19 NOTE — Telephone Encounter (Signed)
Returned call to patient of Dr. Oval Linsey who reports dizziness since yesterday. She states she "doesn't feel good" and "knows there is something going on". She c/o lightheadedness, dizziness, hot, blurry vision. Her symptoms are constant. She does not monitor BP and HR at home. She did not report CP or SOB.  She called EMS yesterday and was advised to call her doctor's office. She was told yesterday that her BP was low - she does not know her VS from EMS evaluation.  She states her sleep doctor told her that her oxygen is dropping at night.   Asked if she would like to see if there is a sooner appt with MD or APP for evaluation. She states she just needs to know what is going on. She requested to have Dr. Oval Linsey review her concerns and advise her. She is scheduled to see DNP on 3/7.   She saw MD on 02/26/17 - echo & stress test ordered. She had echo done. Stress test was cancelled.

## 2017-03-19 NOTE — Telephone Encounter (Signed)
New message     1) Are you dizzy now? YES, STARTED 03/18/17  Do you feel faint or have you passed out? NO 2) Do you have any other symptoms? BLURRED VISION  3) Have you checked your HR and BP (record if available)? NO

## 2017-03-19 NOTE — Telephone Encounter (Signed)
ONO with Bipap 03/13/17 >> test time 10 hrs 49 min.  Basal SpO2 94%, low SpO2 86%.  Spent 45 min with SpO2 < 88%.   Please let her know that her oxygen level is low in spite of using Bipap.  She needs to have in lab Bipap titration study to determine if she needs adjustment in her Bipap setting and/or addition of supplemental oxygen at night.  If she is okay with this plan, then please arrange for Bipap titration study in sleep lab.

## 2017-03-19 NOTE — Telephone Encounter (Signed)
Nothing to offer over the phone Good sign that cpap makes it better Can see tomorrow at 8 30 am if she'll bring all meds and med calendar with her To er if worse in meantime

## 2017-03-19 NOTE — Telephone Encounter (Signed)
Spoke with pt. She is requesting her ONO results.  Dr. [name] - please advise. Thanks.

## 2017-03-20 ENCOUNTER — Telehealth: Payer: Self-pay | Admitting: Cardiovascular Disease

## 2017-03-20 NOTE — Telephone Encounter (Signed)
Spoke with patient and she did have her Spironolactone and Torsemide along with her Losartan this morning. Discussed with Dr Oval Linsey and will have patient just take Losartan 50 mg daily, Spironolactone 12.5 MG twice a day, and Torsemide 40 mg twice a day from now on. Continue to monitor blood pressure twice a day and keep appointment next week. Call back if blood pressure start to go up in the 140's. Advised patient and asked that she bring her blood pressure machine to appoitment, verbalized understanding.

## 2017-03-20 NOTE — Telephone Encounter (Signed)
Returned call to patient of Dr. Oval Linsey who called in about her BP yesterday. She is calling today with update on her VS - BP 111/62 and HR 80  Patient took losartan 50mg  this AM. She has not taken hydralazine or carvedilol or isosorbide. She states she is feeling fine today. She would like to know if she should take her other medications and states she plans to not take until she hears from MD  Routed to MD

## 2017-03-20 NOTE — Telephone Encounter (Signed)
New message  Pt verbalized that she is calling for the RN  To give bp reading 111/62     Hr 80

## 2017-03-21 ENCOUNTER — Encounter (HOSPITAL_COMMUNITY): Payer: Self-pay | Admitting: Emergency Medicine

## 2017-03-21 ENCOUNTER — Other Ambulatory Visit: Payer: Self-pay

## 2017-03-21 ENCOUNTER — Emergency Department (HOSPITAL_COMMUNITY)
Admission: EM | Admit: 2017-03-21 | Discharge: 2017-03-21 | Disposition: A | Discharge status: Home / Self Care | Payer: 59 | Attending: Emergency Medicine | Admitting: Emergency Medicine

## 2017-03-21 ENCOUNTER — Emergency Department (HOSPITAL_COMMUNITY): Payer: 59

## 2017-03-21 ENCOUNTER — Encounter (HOSPITAL_COMMUNITY): Payer: Self-pay

## 2017-03-21 DIAGNOSIS — I130 Hypertensive heart and chronic kidney disease with heart failure and stage 1 through stage 4 chronic kidney disease, or unspecified chronic kidney disease: Secondary | ICD-10-CM | POA: Insufficient documentation

## 2017-03-21 DIAGNOSIS — R0602 Shortness of breath: Secondary | ICD-10-CM | POA: Insufficient documentation

## 2017-03-21 DIAGNOSIS — S161XXA Strain of muscle, fascia and tendon at neck level, initial encounter: Secondary | ICD-10-CM | POA: Insufficient documentation

## 2017-03-21 DIAGNOSIS — Y998 Other external cause status: Secondary | ICD-10-CM | POA: Insufficient documentation

## 2017-03-21 DIAGNOSIS — Z5321 Procedure and treatment not carried out due to patient leaving prior to being seen by health care provider: Secondary | ICD-10-CM | POA: Insufficient documentation

## 2017-03-21 DIAGNOSIS — Y929 Unspecified place or not applicable: Secondary | ICD-10-CM | POA: Insufficient documentation

## 2017-03-21 DIAGNOSIS — R0989 Other specified symptoms and signs involving the circulatory and respiratory systems: Secondary | ICD-10-CM | POA: Insufficient documentation

## 2017-03-21 DIAGNOSIS — N183 Chronic kidney disease, stage 3 (moderate): Secondary | ICD-10-CM | POA: Insufficient documentation

## 2017-03-21 DIAGNOSIS — Z91040 Latex allergy status: Secondary | ICD-10-CM | POA: Insufficient documentation

## 2017-03-21 DIAGNOSIS — I2510 Atherosclerotic heart disease of native coronary artery without angina pectoris: Secondary | ICD-10-CM | POA: Insufficient documentation

## 2017-03-21 DIAGNOSIS — Z7982 Long term (current) use of aspirin: Secondary | ICD-10-CM | POA: Insufficient documentation

## 2017-03-21 DIAGNOSIS — M542 Cervicalgia: Secondary | ICD-10-CM | POA: Diagnosis present

## 2017-03-21 DIAGNOSIS — I5032 Chronic diastolic (congestive) heart failure: Secondary | ICD-10-CM | POA: Insufficient documentation

## 2017-03-21 DIAGNOSIS — Z79899 Other long term (current) drug therapy: Secondary | ICD-10-CM | POA: Insufficient documentation

## 2017-03-21 DIAGNOSIS — R2243 Localized swelling, mass and lump, lower limb, bilateral: Secondary | ICD-10-CM | POA: Insufficient documentation

## 2017-03-21 DIAGNOSIS — R079 Chest pain, unspecified: Secondary | ICD-10-CM | POA: Insufficient documentation

## 2017-03-21 DIAGNOSIS — X58XXXA Exposure to other specified factors, initial encounter: Secondary | ICD-10-CM | POA: Insufficient documentation

## 2017-03-21 DIAGNOSIS — Y939 Activity, unspecified: Secondary | ICD-10-CM | POA: Insufficient documentation

## 2017-03-21 DIAGNOSIS — J449 Chronic obstructive pulmonary disease, unspecified: Secondary | ICD-10-CM | POA: Insufficient documentation

## 2017-03-21 LAB — BASIC METABOLIC PANEL
ANION GAP: 12 (ref 5–15)
BUN: 16 mg/dL (ref 6–20)
CHLORIDE: 103 mmol/L (ref 101–111)
CO2: 24 mmol/L (ref 22–32)
CREATININE: 1.19 mg/dL — AB (ref 0.44–1.00)
Calcium: 9.4 mg/dL (ref 8.9–10.3)
GFR calc Af Amer: 57 mL/min — ABNORMAL LOW (ref >60)
GFR, EST NON AFRICAN AMERICAN: 49 mL/min — AB (ref >60)
Glucose, Bld: 110 mg/dL — ABNORMAL HIGH (ref 65–99)
Potassium: 4.1 mmol/L (ref 3.5–5.1)
Sodium: 139 mmol/L (ref 135–145)

## 2017-03-21 LAB — I-STAT TROPONIN, ED: Troponin i, poc: 0 ng/mL (ref 0.00–0.08)

## 2017-03-21 LAB — I-STAT BETA HCG BLOOD, ED (MC, WL, AP ONLY): I-stat hCG, quantitative: <5 m[IU]/mL (ref <5)

## 2017-03-21 LAB — CBC
HEMATOCRIT: 34.7 % — AB (ref 36.0–46.0)
Hemoglobin: 11.3 g/dL — ABNORMAL LOW (ref 12.0–15.0)
MCH: 31 pg (ref 26.0–34.0)
MCHC: 32.6 g/dL (ref 30.0–36.0)
MCV: 95.3 fL (ref 78.0–100.0)
PLATELETS: 280 10*3/uL (ref 150–400)
RBC: 3.64 MIL/uL — AB (ref 3.87–5.11)
RDW: 14 % (ref 11.5–15.5)
WBC: 12.6 10*3/uL — AB (ref 4.0–10.5)

## 2017-03-21 NOTE — ED Triage Notes (Signed)
Per Pt, Pt is coming from home with complaints of left neck stiffness and pain that started this morning. Pt Denies any numbness or tingling, CP, SOB. Pt has hx of CHF.

## 2017-03-21 NOTE — ED Notes (Signed)
Pt seen by PA.

## 2017-03-21 NOTE — ED Triage Notes (Signed)
Pt to ED with c/o chest pain, left side neck pain and shortness of breath.  Pt was seen earlier today but st's she was not evaluated for her chest pain or shortness of breath.  Pt speaking in full sentences

## 2017-03-21 NOTE — ED Provider Notes (Addendum)
Patient placed in Quick Look pathway, seen and evaluated   Chief Complaint: neck pain  HPI:   Sudden onset of pain described as "tight" and "sharp" to left side of neck radiating to top of left shoulder onset when waking up this morning. Worse with palpation and bending/rotation neck to left.   ROS: No trauma, headache, chest pain, paresthesias to LUE, fevers, chills, midline neck pain.  Physical Exam:   Gen: No distress  Neuro: Awake and Alert  Skin: Warm    Focused Exam: Focal tenderness to insertion of left trapezius at base of skull and to shoulder. No midline c-spine tenderness or step offs. Full AROM of neck without meningeal signs or rigidity, able to touch chin to chest without pain. Painless AROM of shoulders.    Initiation of care has begun. The patient has been counseled on the process, plan, and necessity for staying for the completion/evaluation, and the remainder of the medical screening examination. No labs or imaging indicated based on brief triage encounter.     Kinnie Feil, PA-C 03/21/17 1442    Kinnie Feil, PA-C 04/02/17 1156    Hayden Rasmussen, MD 04/02/17 [phone]

## 2017-03-22 ENCOUNTER — Emergency Department (HOSPITAL_COMMUNITY)
Admission: EM | Admit: 2017-03-22 | Discharge: 2017-03-22 | Disposition: A | Discharge status: Home / Self Care | Payer: 59 | Attending: Emergency Medicine | Admitting: Emergency Medicine

## 2017-03-22 DIAGNOSIS — R0989 Other specified symptoms and signs involving the circulatory and respiratory systems: Secondary | ICD-10-CM

## 2017-03-22 DIAGNOSIS — S161XXA Strain of muscle, fascia and tendon at neck level, initial encounter: Secondary | ICD-10-CM

## 2017-03-22 LAB — BRAIN NATRIURETIC PEPTIDE: B NATRIURETIC PEPTIDE 5: 26.1 pg/mL (ref 0.0–100.0)

## 2017-03-22 MED ORDER — LIDOCAINE 5 % EX PTCH: 1.0000 | MEDICATED_PATCH | CUTANEOUS | 0 refills | Status: DC

## 2017-03-22 MED ORDER — TORSEMIDE 20 MG PO TABS
80.0000 mg | ORAL_TABLET | Freq: Every day | ORAL | Status: DC
  Administered 2017-03-22: 80 mg via ORAL
  Filled 2017-03-22: qty 4

## 2017-03-22 MED ORDER — LIDOCAINE 5 % EX PTCH
1.0000 | MEDICATED_PATCH | CUTANEOUS | Status: DC
  Administered 2017-03-22: 1 via TRANSDERMAL
  Filled 2017-03-22: qty 1

## 2017-03-22 MED ORDER — PREDNISONE 20 MG PO TABS
60.0000 mg | ORAL_TABLET | Freq: Once | ORAL | Status: AC
  Administered 2017-03-22: 60 mg via ORAL
  Filled 2017-03-22: qty 3

## 2017-03-22 NOTE — ED Notes (Signed)
Pt remains in waiting room. Updated on wait for treatment room. 

## 2017-03-22 NOTE — ED Notes (Signed)
Pt is repeatedly complaining about the wait time to the staff. Staff apologizes about the wait time and ensured her that all of her initial lab work and testing has been done and that we are monitoring her results.

## 2017-03-22 NOTE — ED Provider Notes (Signed)
[facility] EMERGENCY DEPARTMENT Provider Note   CSN: [id number] Arrival date & time: 03/21/17  2232     History   Chief Complaint Chief Complaint  Patient presents with  . Chest Pain  . Neck Pain  . Shortness of Breath    HPI NOHELI MELDER is a 57 y.o. female.  57 year old female with a history of CKD, CHF, COPD, hypertension presents to the emergency department for complaints of neck pain.  She states that she experienced sudden onset of pain which has been a tightening sensation and intermittently sharp.  It is worse with neck movement especially with lateral rotation.  Pain is been waxing and waning in severity.  She denies taking any medications for these symptoms.  No known trauma or injury to her neck.  She denies any extremity numbness, paresthesias, weakness.    Patient with secondary complaint of shortness of breath.  She believes that she may have had some slight worsening to lower extremity swelling.  She is unsure of any specific weight gain.  She has had multiple medication changes with respect to her blood pressure pills.  She reports that her blood pressure has been unusually low lately and she has been consulting her cardiologist for recommended medication adjustments.  She is scheduled to see her cardiologist in follow-up soon.  No recent fevers, chills, or associated chest pain.   The history is provided by the patient. No language interpreter was used.    Past Medical History:  Diagnosis Date  . Acute on chronic diastolic CHF (congestive heart failure) ([place]) 04/11/2014  . Acute suppurative otitis media without spontaneous rupture of eardrum 02/04/2007   Centricity Description: OTITIS MEDIA, SUPPURATIVE, ACUTE, BILATERAL Qualifier: Diagnosis of  By: [name] MD, [name]  Centricity Description: OTITIS MEDIA, SUPPURATIVE, ACUTE Qualifier: Diagnosis of  By: [name] MD, [name]   . Allergic rhinitis   . CAD (coronary artery disease)    LHC (11/29/1999):   EF 60%; no significant CAD.  [name] Carpal tunnel syndrome   . Carpal tunnel syndrome 06/09/2006   Qualifier: Diagnosis of  By: Marca Ancona RMA, Lucy    . Chronic diastolic CHF (congestive heart failure) ([place])    a. Echo (01/21/11): Vigorous LVF, EF 65-70%, no RWMA, Gr 2 DD. b.  Echo (12/15): Moderate LVH, EF 46-65%, grade 2 diastolic dysfunction, mild LAE, normal RV function c. 01/2015: echo showing EF of 60-65% with mild LVH  . Common migraine   . COPD (chronic obstructive pulmonary disease) ([place])   . Diastolic heart failure ([place])   . Diverticulitis   . Hepatic steatosis   . Hepatomegaly   . Hiatal hernia   . Hyperlipidemia   . Hypertension   . IBS (irritable bowel syndrome)   . Internal hemorrhoids   . Morbid obesity ([place])   . OSA (obstructive sleep apnea)    CPAP dependent  . PNEUMONIA, ORGAN UNSPECIFIED 03/01/2009   Qualifier: Diagnosis of  By: [name] CMA, [name]    . Sinusitis, chronic 03/10/2012   CT sinuses 02/2012:  Chronic sinusitis with small A/F levels >> rx by ENT with prolonged augmentin F/u ct sinuses 04/2012:  Persistent sinus thickening >> rx with levaquin and clinda for 30 days.  Told to return for sinus scan in 6 weeks >> no showed for followup visit.  CT sinuses 11/2013:  Acute and chronic sinusitis noted.      Patient Active Problem List   Diagnosis Date Noted  . CKD (chronic kidney disease) stage 3,  GFR 30-59 ml/min ([place]) 07/08/2016  . Cough variant asthma 07/31/2015  . Upper airway cough syndrome 04/11/2015  . Bronchitis, chronic obstructive w acute bronchitis ([place]) 04/11/2015  . Dyspnea   . Acute on chronic diastolic CHF (congestive heart failure), NYHA class 2 ([place]) 03/27/2015  . Morbid obesity due to excess calories ([place]) 03/27/2015  . Dysphagia 03/27/2015  . Hyponatremia 03/27/2015  . Acute kidney injury ([place]) 03/27/2015  . Hypokalemia 03/27/2015  . Leukocytosis 03/27/2015  . COPD exacerbation ([place]) 03/27/2015  . Sinusitis, chronic 03/10/2012  . Hyperlipidemia  10/02/2006  . Obstructive sleep apnea 06/09/2006  . Essential hypertension 06/09/2006    Past Surgical History:  Procedure Laterality Date  . ABDOMINAL HYSTERECTOMY    . CESAREAN SECTION    . NASAL SINUS SURGERY    . PARTIAL HYSTERECTOMY    . TONSILLECTOMY    . TUBAL LIGATION    . UVULOPALATOPHARYNGOPLASTY      OB History    No data available       Home Medications    Prior to Admission medications   Medication Sig Start Date End Date Taking? Authorizing Provider  albuterol (PROVENTIL) (2.5 MG/3ML) 0.083% nebulizer solution Take 3 mLs (2.5 mg total) by nebulization every 4 (four) hours as needed for wheezing or shortness of breath. 11/08/16   Tanda Rockers, MD  amoxicillin-clavulanate (AUGMENTIN) 875-125 MG tablet Take 1 tablet by mouth 2 (two) times daily. 03/05/17   Chesley Mires, MD  Ascorbic Acid (VITAMIN C) 1000 MG tablet Take 1,000 mg by mouth daily.    [provider]  aspirin 81 MG EC tablet TAKE 1 TABLET (81 MG TOTAL) BY MOUTH DAILY. 07/08/16   Strader, Fransisco Hertz, PA-C  benzonatate (TESSALON) 200 MG capsule TAKE 1 CAPSULE(200 MG) BY MOUTH THREE TIMES DAILY AS NEEDED FOR COUGH 03/17/17   Chesley Mires, MD  chlorpheniramine (CHLOR-TRIMETON) 4 MG tablet Take 2 tablets by mouth at bedtime.  May add 1 extra every 4 hours as needed for drainage, drippy nose, throat clearing    [provider]  Dextromethorphan-Guaifenesin (MUCINEX DM MAXIMUM STRENGTH) 60-1200 MG TB12 Take 1 tablet by mouth 2 (two) times daily as needed (cough).     [provider]  Diclofenac Sodium 3 % GEL Apply 1 application topically 3 (three) times daily. Knee pain    [provider]  dicyclomine (BENTYL) 10 MG capsule TAKE 1 CAPSULE(10 MG) BY MOUTH FOUR TIMES DAILY BEFORE MEALS AND AT BEDTIME 12/04/15   Irene Shipper, MD  diltiazem 2 % GEL Use as directed when needed for hemorroids    [provider]  famotidine (PEPCID) 20 MG tablet TAKE 1 TABLET BY MOUTH EVERY  NIGHT AT BEDTIME 02/26/17   Tanda Rockers, MD  fluticasone (FLONASE) 50 MCG/ACT nasal spray PLACE 2 SPRAYS INTO BOTH NOSTRILS DAILY. 03/05/17   Chesley Mires, MD  gabapentin (NEURONTIN) 100 MG capsule Take 1 capsule (100 mg total) by mouth 3 (three) times daily. One three times daily 02/07/17   Tanda Rockers, MD  lidocaine (LIDODERM) 5 % Place 1 patch onto the skin daily. Remove & Discard patch within 12 hours or as directed by MD 03/22/17   Antonietta Breach, PA-C  losartan (COZAAR) 100 MG tablet Take 100 mg by mouth as directed. Take 1/2 tablet by mouth daily    [provider]  metolazone (ZAROXOLYN) 2.5 MG tablet Take 2.5 mg by mouth daily as needed (leg swelling, weight gain).    [provider]  montelukast (SINGULAIR) 10 MG tablet Take 1 tablet (10 mg total) by mouth at bedtime. 10/15/16   Juanito Doom, MD  pantoprazole (PROTONIX) 40 MG tablet TAKE 1 TABLET BY MOUTH DAILY, TAKE 30 TO 60 MINUTES BEFORE FIRST MEAL OF THE DAY 11/07/15   Tanda Rockers, MD  polyethylene glycol powder (GLYCOLAX/MIRALAX) powder MIX 2 CAPFULS IN MORNING WITH LIQUID AND MIX ONE CAPFUL IN THE EVENING 03/18/17   Willia Craze, NP  potassium chloride SA (K-DUR,KLOR-CON) 20 MEQ tablet TAKE 3 TABLETS BY MOUTH THREE TIMES DAILY 03/03/17   Minus Breeding, MD  Respiratory Therapy Supplies (FLUTTER) DEVI 1 application by Does not apply route as directed. 12/27/16   Tanda Rockers, MD  spironolactone (ALDACTONE) 25 MG tablet TAKE 0.5 TABLETS (12.5 MG TOTAL) BY MOUTH 2 (TWO) TIMES DAILY. 10/22/16   Skeet Latch, MD  topiramate (TOPAMAX) 25 MG tablet Take 25 mg by mouth 2 (two) times daily as needed (for headaches).  03/03/15   [provider]  torsemide (DEMADEX) 20 MG tablet Take 2 tablets (40 mg total) by mouth 2 (two) times daily. 03/14/17 04/13/17  Skeet Latch, MD  traMADol (ULTRAM) 50 MG tablet Take 1 tablet (50 mg total) by mouth every 6 (six) hours as needed for moderate pain. 02/25/17    Tanda Rockers, MD    Family History Family History  Problem Relation Age of Onset  . Heart disease Mother   . Heart attack Mother   . Hypertension Mother   . Coronary artery disease Unknown        1st degree relatvie <50  . Breast cancer Unknown        aunt- ? paternal or maternal  . Asthma Sister   . Stroke Neg Hx     Social History Social History   Tobacco Use  . Smoking status: Never Smoker  . Smokeless tobacco: Never Used  Substance Use Topics  . Alcohol use: No  . Drug use: No     Allergies   Sulfonamide derivatives; Doxycycline; Latex; Ciprofloxacin; Fish allergy; Hydrocortisone; and Neomycin   Review of Systems Review of Systems Ten systems reviewed and are negative for acute change, except as noted in the HPI.    Physical Exam Updated Vital Signs BP 119/76   Pulse 73   Temp 98.5 F (36.9 C) (Oral)   Resp 18   Ht 5\' 3"  (1.6 m)   Wt 133.8 kg (295 lb)   LMP  (Exact Date)   SpO2 97%   BMI 52.26 kg/m   Physical Exam  Constitutional: She is oriented to person, place, and time. She appears well-developed and well-nourished. No distress.  Nontoxic appearing and in NAD  HENT:  Head: Normocephalic and atraumatic.  Eyes: Conjunctivae and EOM are normal. No scleral icterus.  Neck: Normal range of motion.  TTP along the course of the left SCM without spasm. No bony deformity or crepitus to the cervical midline.  Cardiovascular: Normal rate, regular rhythm and intact distal pulses.  Pulmonary/Chest: Effort normal. No stridor. No respiratory distress. She has no rales.  Respirations even and unlabored  Musculoskeletal: Normal range of motion.  Neurological: She is alert and oriented to person, place, and time. She exhibits normal muscle tone. Coordination normal.  Grip strength equal in bilateral upper extremities.  Sensation to light touch intact.  Ambulatory with steady gait.  Skin: Skin is warm and dry. No rash noted. She is not diaphoretic. No  erythema. No pallor.  Psychiatric: She has  a normal mood and affect. Her behavior is normal.  Nursing note and vitals reviewed.    ED Treatments / Results  Labs (all labs ordered are listed, but only abnormal results are displayed) Labs Reviewed  BASIC METABOLIC PANEL - Abnormal; Notable for the following components:      Result Value   Glucose, Bld 110 (*)    Creatinine, Ser 1.19 (*)    GFR calc non Af Amer 49 (*)    GFR calc Af Amer 57 (*)    All other components within normal limits  CBC - Abnormal; Notable for the following components:   WBC 12.6 (*)    RBC 3.64 (*)    Hemoglobin 11.3 (*)    HCT 34.7 (*)    All other components within normal limits  BRAIN NATRIURETIC PEPTIDE  I-STAT TROPONIN, ED  I-STAT BETA HCG BLOOD, ED ([facility], [facility], [facility] ONLY)    EKG  EKG Interpretation None       Radiology Dg Chest 2 View  Result Date: 03/22/2017 CLINICAL DATA:  Chest pain EXAM: CHEST  2 VIEW COMPARISON:  02/23/2017 FINDINGS: Cardiomegaly with vascular congestion and mild diffuse interstitial opacities suspicious for edema. No consolidation or effusion. Degenerative changes of the spine. No pneumothorax. IMPRESSION: Cardiomegaly with vascular congestion and mild diffuse interstitial edema. Electronically Signed   By: Donavan Foil M.D.   On: 03/22/2017 00:06    Procedures Procedures (including critical care time)  Medications Ordered in ED Medications  torsemide (DEMADEX) tablet 80 mg (80 mg Oral Given 03/22/17 0417)  lidocaine (LIDODERM) 5 % 1 patch (1 patch Transdermal Patch Applied 03/22/17 0417)  predniSONE (DELTASONE) tablet 60 mg (60 mg Oral Given 03/22/17 0416)     Initial Impression / Assessment and Plan / ED Course  I have reviewed the triage vital signs and the nursing notes.  Pertinent labs & imaging results that were available during my care of the patient were reviewed by me and considered in my medical decision making (see chart for details).     Patient with primary  complaint of left lateral neck pain.  This is reproducible on palpation to the sternocleidomastoid muscle.  Patient neurovascularly intact with equal grip strength and normal strength against resistance.  She is ambulatory without difficulty.  No history of trauma or injury.  Suspect muscle strain.  Plan for outpatient management with Lidoderm patches.  She has been given prednisone in the emergency department to act as an anti-inflammatory.  Patient also with secondary complaint of shortness of breath.  Workup in the emergency department today is generally reassuring.  Creatinine slightly elevated compared to prior evaluations.  Remainder of labs consistent with baseline.  Chest x-ray does show vascular congestion and mild diffuse interstitial edema.  BNP is normal.  It is possible that this vascular congestion may be contributing to the patient's shortness of breath.  No concern for ACS in light of negative troponin and stable EKG.  Plan for short-term increase to daily torsemide.  The patient is to follow-up with her cardiologist regarding her symptoms and for additional recommendations on daily medication regimen.  I have advised primary care follow-up for repeat evaluation of her neck pain.  Return precautions discussed and provided. Patient discharged in stable condition with no unaddressed concerns.  Vitals:   03/22/17 0200 03/22/17 0211 03/22/17 0215 03/22/17 0345  BP: (!) 92/34 (!) 146/83 127/88 119/76  Pulse: 63 60 62 73  Resp: 14 (!) 21 17 18   Temp:  TempSrc:      SpO2: 96% 99% 96% 97%  Weight:      Height:        Final Clinical Impressions(s) / ED Diagnoses   Final diagnoses:  Neck strain, initial encounter  Pulmonary vascular congestion    ED Discharge Orders        Ordered    lidocaine (LIDODERM) 5 %  Every 24 hours     03/22/17 0415       Antonietta Breach, PA-C 04/09/17 0165    Jola Schmidt, MD 04/09/17 [phone]

## 2017-03-22 NOTE — Discharge Instructions (Addendum)
Continue taking losartan and spironolactone as prescribed by your cardiologist.  We have changed your torsemide dose. Start taking 80 mg Torsemide in the morning and 40 mg in the evening for the next 3 days to help improve the small amount of fluid in your lungs.  On Tuesday, you may resume your typical dose of torsemide.  You have been prescribed a Lidoderm patch for management of your neck pain.  Follow-up with your cardiologist and primary care doctor regarding your visit today.

## 2017-03-24 ENCOUNTER — Telehealth: Payer: Self-pay | Admitting: Internal Medicine

## 2017-03-24 ENCOUNTER — Other Ambulatory Visit: Payer: Self-pay

## 2017-03-24 ENCOUNTER — Telehealth: Payer: Self-pay | Admitting: Nurse Practitioner

## 2017-03-24 MED ORDER — POLYETHYLENE GLYCOL 3350 17 GM/SCOOP PO POWD: ORAL | 11 refills | Status: DC

## 2017-03-24 MED ORDER — TRAMADOL HCL 50 MG PO TABS: 50.0000 mg | ORAL_TABLET | Freq: Four times a day (QID) | ORAL | 0 refills | Status: DC | PRN

## 2017-03-24 NOTE — Telephone Encounter (Signed)
Spoke with the patient. She needs a new Rx for Miralax sent to her pharmacy. She takes 3 to 4 times daily. Without this she has abdominal discomfort and cannot go. She is experiencing an increase of indigestion with reflux. She wears a CPAP. She awakes in the night coughing. She has a bitter taste in her mouth. During the day she will burp and reflux her food. She states she is taking Famotidine. She does not have Pantoprazole.  She would like to schedule her screening colonoscopy. Due to BMI she will need to have her procedure at the hospital. Is this okay to schedule? Do you want a 2 day prep?  Her last visit was July 26, 2016.

## 2017-03-24 NOTE — Telephone Encounter (Signed)
Patient now states she is vomiting.

## 2017-03-24 NOTE — Telephone Encounter (Signed)
Patient states she is having extreme abd pain and acid reflux symptoms. Pt transferred care to Dr.Nandigam from Dr.Perry and is also due for colon at [facility] due to BMI. Pt requesting call from nurse to discuss symptoms and scheduling ov or procedure.

## 2017-03-24 NOTE — Telephone Encounter (Signed)
Pt is aware of below message and voiced his understanding.  Pt has been scheduled for OV 04/17/17 at 9:45 with MW.  Rx for Tramdol 50mg  #40 has been phoned in to [facility] at [facility]. Nothing further is needed.

## 2017-03-24 NOTE — Telephone Encounter (Signed)
Called and spoke with pt. Pt is requesting refill on Tramadol 50mg .  I have advised pt that per 02/25/17 phone note she was to keep her apt with TP, as MW is attempting to wean her off of Tramadol. Pt stated that apt with TP was canceled, as VS recommended that she cancel it.  Tramadol 50 last refilled on 02/25/17 #40 with 0 refills.  Pt last seen MW 02/07/17 with no pending apt.  MW please advise. Thanks.    02/07/17 AVS: Gabapentin 100 mg three times a day as per med calendar  Take delsym two tsp every 12 hours and supplement if needed with  tramadol 50 mg up to 2 every 4 hours to suppress the urge to cough. Swallowing water or using ice chips/non mint and menthol containing candies (such as lifesavers or sugarless jolly ranchers) are also effective.  You should rest your voice and avoid activities that you know make you cough.  Once you have eliminated the cough for 3 straight days try reducing the tramadol first,  then the delsym as tolerated.   Please remember to go to the lab department downstairs in the basement  for your tests - we will call you with the results when they are available.      See [name] NP  In 4  weeks with all your medications, even over the counter meds, separated in two separate bags, the ones you take no matter what vs the ones you stop once you feel better and take only as needed when you feel you need them.   [name]  will generate for you a new user friendly medication calendar that will put [place] all on the same page re: your medication use.     Without this process, it simply isn't possible to assure that we are providing  your outpatient care  with  the attention to detail we feel you deserve.   If we cannot assure that you're getting that kind of care,  then we cannot manage your problem effectively from this clinic.  Once you have seen [name] and we are sure that we're all on the same page with your medication use she will arrange follow up with me.    [name]

## 2017-03-24 NOTE — Telephone Encounter (Signed)
Spoke with the patient. She states she is okay and will wait for the advice from the provider.

## 2017-03-24 NOTE — Telephone Encounter (Signed)
Ok to add on to my schedule with all meds in hand and med calendar and give her enough tramadol to last until that ov

## 2017-03-25 ENCOUNTER — Other Ambulatory Visit: Payer: Self-pay

## 2017-03-25 MED ORDER — PANTOPRAZOLE SODIUM 40 MG PO TBEC: DELAYED_RELEASE_TABLET | ORAL | 1 refills | Status: DC

## 2017-03-25 NOTE — Telephone Encounter (Signed)
Dr Silverio Decamp, I have refilled the Miralax. Is it okay to refill Pantoprazole for her? Can she be scheduled for a direct colonoscopy at the hospital? And do you want a 2 day prep with the history of constipation? BMI 52.26

## 2017-03-25 NOTE — Telephone Encounter (Signed)
Patient states it is a new day and she did not get a call back from provider as told she would yesterday. Patient wanting advice from provider and states she needs to be scheduled asap while she is in [employer].

## 2017-03-25 NOTE — Telephone Encounter (Signed)
Ok to schedule direct colonoscopy with 2 days prep. Ok to refill Protonix. Thanks

## 2017-03-25 NOTE — Telephone Encounter (Signed)
Spoke with the patient. She accepts an appointment for pre-visit on 04/25/17 at 10:00 am for instructions on a 2 day prep. History of chronic constipation. Procedure will be at [facility] on 05/05/17 at 10:45.  Appointment cards mailed per her request. Merril Abbe is no longer covered by her insurance. She will contact her insurance to discuss this. Aware it can also be purchased OTC.  Protonix refilled.

## 2017-03-26 NOTE — Progress Notes (Signed)
Cardiology Office Note   Date:  03/27/2017   ID:  [name], DOB 10/25/1960, MRN [id number]  PCP:  Bartholome Bill, MD  Cardiologist: Dr. Skeet Latch Chief Complaint  Patient presents with  . Hypertension  . Dizziness     History of Present Illness: [name] is a 56 y.o. female who presents for ongoing assessment and management of hypertension, chronic diastolic heart failure, with known history of hyperlipidemia, OSA on BiPAP, COPD, and morbid obesity. The patient was last seen in the office on 02/27/2017, by Dr.Lake Lindsey and  continued to complain of dyspnea on exertion.  Dr. Oval Linsey diagnosed her with inappropriate sinus tachycardia, she was not found to be in decompensated CHF. The patient with started on carvedilol 12.5 mg twice a day, continued on hydralazine, and indoor, metolazone, spironolactone, and torsemide. Patient was also scheduled for [facility] and echocardiogram to evaluate for ischemia and LV function.  Patient called our office on 03/19/2017 complaining of lightheadedness and dizziness. Blood pressure was 121/56. The patient's hydralazine was decreased to 25 mg 3 times a day, and was to call back if her systolic blood pressures less than 100.  Patient comes today stating that her dizziness continues to be an issue despite medication adjustments. She isn't followed by primary care who has again adjusted medications. The patient had been on hydralazine 37.5 mg 3 times a day which was not on her original med list our office today. She states she has dizziness when she is on her feet all minutes. No near-syncope or syncope episodes.  Past Medical History:  Diagnosis Date  . Acute on chronic diastolic CHF (congestive heart failure) ([place]) 04/11/2014  . Acute suppurative otitis media without spontaneous rupture of eardrum 02/04/2007   Centricity Description: OTITIS MEDIA, SUPPURATIVE, ACUTE, BILATERAL Qualifier: Diagnosis of  By: [name] MD, [name]   Centricity Description: OTITIS MEDIA, SUPPURATIVE, ACUTE Qualifier: Diagnosis of  By: [name] MD, [name]   . Allergic rhinitis   . CAD (coronary artery disease)    LHC (11/29/1999):  EF 60%; no significant CAD.  [name] Carpal tunnel syndrome   . Carpal tunnel syndrome 06/09/2006   Qualifier: Diagnosis of  By: Marca Ancona RMA, Lucy    . Chronic diastolic CHF (congestive heart failure) ([place])    a. Echo (01/21/11): Vigorous LVF, EF 65-70%, no RWMA, Gr 2 DD. b.  Echo (12/15): Moderate LVH, EF 79-02%, grade 2 diastolic dysfunction, mild LAE, normal RV function c. 01/2015: echo showing EF of 60-65% with mild LVH  . Common migraine   . COPD (chronic obstructive pulmonary disease) ([place])   . Diastolic heart failure ([place])   . Diverticulitis   . Hepatic steatosis   . Hepatomegaly   . Hiatal hernia   . Hyperlipidemia   . Hypertension   . IBS (irritable bowel syndrome)   . Internal hemorrhoids   . Morbid obesity ([place])   . OSA (obstructive sleep apnea)    CPAP dependent  . PNEUMONIA, ORGAN UNSPECIFIED 03/01/2009   Qualifier: Diagnosis of  By: [name] CMA, [name]    . Sinusitis, chronic 03/10/2012   CT sinuses 02/2012:  Chronic sinusitis with small A/F levels >> rx by ENT with prolonged augmentin F/u ct sinuses 04/2012:  Persistent sinus thickening >> rx with levaquin and clinda for 30 days.  Told to return for sinus scan in 6 weeks >> no showed for followup visit.  CT sinuses 11/2013:  Acute and chronic sinusitis noted.      Past Surgical  History:  Procedure Laterality Date  . ABDOMINAL HYSTERECTOMY    . CESAREAN SECTION    . NASAL SINUS SURGERY    . PARTIAL HYSTERECTOMY    . TONSILLECTOMY    . TUBAL LIGATION    . UVULOPALATOPHARYNGOPLASTY       Current Outpatient Medications  Medication Sig Dispense Refill  . albuterol (PROVENTIL) (2.5 MG/3ML) 0.083% nebulizer solution Take 3 mLs (2.5 mg total) by nebulization every 4 (four) hours as needed for wheezing or shortness of breath. 300 mL 2  . Ascorbic  Acid (VITAMIN C) 1000 MG tablet Take 1,000 mg by mouth daily.    [name] aspirin 81 MG EC tablet TAKE 1 TABLET (81 MG TOTAL) BY MOUTH DAILY. 90 tablet 2  . benzonatate (TESSALON) 200 MG capsule TAKE 1 CAPSULE(200 MG) BY MOUTH THREE TIMES DAILY AS NEEDED FOR COUGH 20 capsule 0  . chlorpheniramine (CHLOR-TRIMETON) 4 MG tablet Take 2 tablets by mouth at bedtime.  May add 1 extra every 4 hours as needed for drainage, drippy nose, throat clearing    . Dextromethorphan-Guaifenesin (MUCINEX DM MAXIMUM STRENGTH) 60-1200 MG TB12 Take 1 tablet by mouth 2 (two) times daily as needed (cough).     . Diclofenac Sodium 3 % GEL Apply 1 application topically 3 (three) times daily. Knee pain    . dicyclomine (BENTYL) 10 MG capsule TAKE 1 CAPSULE(10 MG) BY MOUTH FOUR TIMES DAILY BEFORE MEALS AND AT BEDTIME 90 capsule 0  . diltiazem 2 % GEL Use as directed when needed for hemorroids    . famotidine (PEPCID) 20 MG tablet TAKE 1 TABLET BY MOUTH EVERY NIGHT AT BEDTIME 90 tablet 0  . fluticasone (FLONASE) 50 MCG/ACT nasal spray PLACE 2 SPRAYS INTO BOTH NOSTRILS DAILY. 16 g 2  . gabapentin (NEURONTIN) 100 MG capsule Take 1 capsule (100 mg total) by mouth 3 (three) times daily. One three times daily 90 capsule 2  . lidocaine (LIDODERM) 5 % Place 1 patch onto the skin daily. Remove & Discard patch within 12 hours or as directed by MD 10 patch 0  . losartan (COZAAR) 100 MG tablet Take 100 mg by mouth as directed. Take 1/2 tablet by mouth daily    . metolazone (ZAROXOLYN) 2.5 MG tablet Take 2.5 mg by mouth daily as needed (leg swelling, weight gain).    . montelukast (SINGULAIR) 10 MG tablet Take 1 tablet (10 mg total) by mouth at bedtime. 30 tablet 5  . pantoprazole (PROTONIX) 40 MG tablet TAKE 1 TABLET BY MOUTH DAILY, TAKE 30 TO 60 MINUTES BEFORE FIRST MEAL OF THE DAY 90 tablet 1  . polyethylene glycol powder (GLYCOLAX/MIRALAX) powder Use 2 capfuls in the morning and 1 capful in the evening and as directed 527 g 11  . potassium  chloride SA (K-DUR,KLOR-CON) 20 MEQ tablet TAKE 3 TABLETS BY MOUTH THREE TIMES DAILY 270 tablet 2  . Respiratory Therapy Supplies (FLUTTER) DEVI 1 application by Does not apply route as directed. 1 each 0  . spironolactone (ALDACTONE) 25 MG tablet TAKE 0.5 TABLETS (12.5 MG TOTAL) BY MOUTH 2 (TWO) TIMES DAILY. 90 tablet 3  . topiramate (TOPAMAX) 25 MG tablet Take 25 mg by mouth 2 (two) times daily as needed (for headaches).     . torsemide (DEMADEX) 20 MG tablet Take 2 tablets (40 mg total) by mouth 2 (two) times daily. 120 tablet 6  . traMADol (ULTRAM) 50 MG tablet Take 1 tablet (50 mg total) by mouth every 6 (six) hours as needed for  moderate pain. 40 tablet 0  . hydrALAZINE (APRESOLINE) 25 MG tablet Take 1 tablet (25 mg total) by mouth 3 (three) times daily. 90 tablet 3   No current facility-administered medications for this visit.     Allergies:   Sulfonamide derivatives; Doxycycline; Latex; Ciprofloxacin; Fish allergy; Hydrocortisone; and Neomycin    Social History:  The patient  reports that  has never smoked. she has never used smokeless tobacco. She reports that she does not drink alcohol or use drugs.   Family History:  The patient's family history includes Asthma in her sister; Breast cancer in her unknown relative; Coronary artery disease in her unknown relative; Heart attack in her mother; Heart disease in her mother; Hypertension in her mother.    ROS: All other systems are reviewed and negative. Unless otherwise mentioned in H&P    PHYSICAL EXAM: VS:  BP 120/64   Pulse 94   Ht 5\' 3"  (1.6 m)   Wt 295 lb (133.8 kg)   BMI 52.26 kg/m  , BMI Body mass index is 52.26 kg/m. GEN: Well nourished, well developed, in no acute distress  HEENT: normal  Neck: no JVD, carotid bruits, or masses Cardiac: RRR; no murmurs, rubs, or gallops,no edema  Respiratory:  clear to auscultation bilaterally, normal work of breathing GI: soft, nontender, nondistended, + BS MS: no deformity or  atrophy  Skin: warm and dry, no rash Neuro:  Strength and sensation are intact Psych: euthymic mood, full affect  Recent Labs: 04/12/2016: ALT 20; Magnesium 1.9 05/01/2016: TSH 1.05 03/21/2017: B Natriuretic Peptide 26.1; BUN 16; Creatinine, Ser 1.19; Hemoglobin 11.3; Platelets 280; Potassium 4.1; Sodium 139    Lipid Panel    Component Value Date/Time   CHOL 169 02/20/2015 0440   TRIG 310 (H) 02/20/2015 0440   HDL 35 (L) 02/20/2015 0440   CHOLHDL 4.8 02/20/2015 0440   VLDL 62 (H) 02/20/2015 0440   LDLCALC 72 02/20/2015 0440      Wt Readings from Last 3 Encounters:  03/27/17 295 lb (133.8 kg)  03/21/17 295 lb (133.8 kg)  03/21/17 295 lb (133.8 kg)      Other studies Reviewed: Echocardiogram 2017-03-28 Left ventricle: The cavity size was normal. There was moderate   concentric hypertrophy. Systolic function was vigorous. The   estimated ejection fraction was in the range of 65% to 70%. Wall   motion was normal; there were no regional wall motion   abnormalities. Doppler parameters are consistent with abnormal   left ventricular relaxation (grade 1 diastolic dysfunction).   Doppler parameters are consistent with elevated ventricular   end-diastolic filling pressure. - Aortic valve: There was no regurgitation. - Mitral valve: There was no regurgitation. - Left atrium: The atrium was normal in size. - Right ventricle: Systolic function was normal. - Right atrium: The atrium was normal in size. - Tricuspid valve: There was no regurgitation. - Pulmonary arteries: Systolic pressure could not be accurately   estimated. - Inferior vena cava: The vessel was normal in size. - Pericardium, extracardiac: There was no pericardial effusion.  ASSESSMENT AND PLAN:  1. Hypertension: She states that her blood pressure is much lower at home that are recording of 120/64, but is not dropped below 427 systolic. I'm going to adjust her medication regimen to decrease hydralazine to 25 mg 3  times a day from 37.5 mg 3 times a day. She is to take her blood pressure and record it and bring it back with her on next office visit. Whether medication  changes or testing is planned.  2. Dizziness: Uncertain etiology. It may be that her blood pressure is intact properly been on her feet for a while. Can always consider a cardiac monitor if necessary, if medication changes are not improving she denies any racing heart rate or palpitations currently.  3. Morbid obesity: The patient has lost approximately 25 pounds, and one to lose more weight. It may be possible due to weight loss pressure medications may be at higher doses than she can tolerate. Will follow   Current medicines are reviewed at length with the patient today.    Labs/ tests ordered today include: none Phill Myron. [place] Pugh, ANP, AACC   03/27/2017 12:28 PM    [facility] [address], [place], [place] [address] Phone: [phone]; Fax: [phone]

## 2017-03-27 ENCOUNTER — Telehealth (HOSPITAL_COMMUNITY): Payer: Self-pay

## 2017-03-27 ENCOUNTER — Ambulatory Visit (INDEPENDENT_AMBULATORY_CARE_PROVIDER_SITE_OTHER): Payer: 59 | Admitting: Adult Health

## 2017-03-27 ENCOUNTER — Encounter: Payer: Self-pay | Admitting: Adult Health

## 2017-03-27 VITALS — BP 120/64 | HR 94 | Ht 63.0 in | Wt 295.0 lb

## 2017-03-27 DIAGNOSIS — R42 Dizziness and giddiness: Secondary | ICD-10-CM | POA: Diagnosis not present

## 2017-03-27 DIAGNOSIS — E7800 Pure hypercholesterolemia, unspecified: Secondary | ICD-10-CM | POA: Diagnosis not present

## 2017-03-27 DIAGNOSIS — I10 Essential (primary) hypertension: Secondary | ICD-10-CM | POA: Diagnosis not present

## 2017-03-27 MED ORDER — HYDRALAZINE HCL 25 MG PO TABS: 25.0000 mg | ORAL_TABLET | Freq: Three times a day (TID) | ORAL | 3 refills | Status: DC

## 2017-03-27 NOTE — Patient Instructions (Signed)
Medication Instructions:  DECREASE HYDRALAZINE 25MG  THREE TIMES DAILY  If you need a refill on your cardiac medications before your next appointment, please call your pharmacy.  Special Instructions: TAKE AND LOG BP TWICE DAILY AND BRING THIS LOG TO YOUR APPOINTMENT IN ONE MONTH  Follow-Up: Your physician wants you to follow-up in: [place] ([place]), DNP(F/U BP).   Thank you for choosing [facility] at [facility]!!

## 2017-03-27 NOTE — Telephone Encounter (Signed)
Encounter complete. 

## 2017-03-31 ENCOUNTER — Encounter (HOSPITAL_BASED_OUTPATIENT_CLINIC_OR_DEPARTMENT_OTHER): Payer: 59

## 2017-04-01 ENCOUNTER — Ambulatory Visit (HOSPITAL_COMMUNITY)
Admission: RE | Admit: 2017-04-01 | Discharge: 2017-04-01 | Disposition: A | Discharge status: Home / Self Care | Payer: 59 | Source: Ambulatory Visit | Attending: Cardiovascular Disease | Admitting: Cardiovascular Disease

## 2017-04-01 ENCOUNTER — Other Ambulatory Visit: Payer: Self-pay | Admitting: Pulmonary Disease

## 2017-04-01 DIAGNOSIS — R0609 Other forms of dyspnea: Secondary | ICD-10-CM | POA: Diagnosis not present

## 2017-04-01 MED ORDER — AMINOPHYLLINE 25 MG/ML IV SOLN
75.0000 mg | Freq: Once | INTRAVENOUS | Status: AC
  Administered 2017-04-01: 75 mg via INTRAVENOUS

## 2017-04-01 MED ORDER — REGADENOSON 0.4 MG/5ML IV SOLN
0.4000 mg | Freq: Once | INTRAVENOUS | Status: AC
  Administered 2017-04-01: 0.4 mg via INTRAVENOUS

## 2017-04-01 MED ORDER — TECHNETIUM TC 99M TETROFOSMIN IV KIT
31.8000 | PACK | Freq: Once | INTRAVENOUS | Status: AC | PRN
  Administered 2017-04-01: 31.8 via INTRAVENOUS
  Filled 2017-04-01: qty 32

## 2017-04-02 ENCOUNTER — Ambulatory Visit (HOSPITAL_COMMUNITY)
Admission: RE | Admit: 2017-04-02 | Discharge: 2017-04-02 | Disposition: A | Discharge status: Home / Self Care | Payer: 59 | Source: Ambulatory Visit | Attending: Cardiovascular Disease | Admitting: Cardiovascular Disease

## 2017-04-02 LAB — MYOCARDIAL PERFUSION IMAGING
LV dias vol: 87 mL (ref 46–106)
LV sys vol: 20 mL
Peak HR: 95 {beats}/min
Rest HR: 65 {beats}/min
SDS: 1
SRS: 4
SSS: 5
TID: 0.76

## 2017-04-02 MED ORDER — TECHNETIUM TC 99M TETROFOSMIN IV KIT
31.2000 | PACK | Freq: Once | INTRAVENOUS | Status: AC | PRN
  Administered 2017-04-02: 31.2 via INTRAVENOUS

## 2017-04-03 ENCOUNTER — Ambulatory Visit (HOSPITAL_BASED_OUTPATIENT_CLINIC_OR_DEPARTMENT_OTHER): Payer: 59 | Attending: Pulmonary Disease | Admitting: Pulmonary Disease

## 2017-04-03 VITALS — Ht 63.0 in | Wt 295.0 lb

## 2017-04-03 DIAGNOSIS — G4733 Obstructive sleep apnea (adult) (pediatric): Secondary | ICD-10-CM

## 2017-04-10 ENCOUNTER — Telehealth: Payer: Self-pay | Admitting: Pulmonary Disease

## 2017-04-10 DIAGNOSIS — G4733 Obstructive sleep apnea (adult) (pediatric): Secondary | ICD-10-CM | POA: Diagnosis not present

## 2017-04-10 NOTE — Procedures (Signed)
    Patient Name:<br> [name], [name]  Study Date: 04/03/2017   Gender: Female  D.O.B: 11/01/1960  Age (years): 61  Referring Provider: Chesley Mires MD, ABSM  Height (inches): <br>  Interpreting Physician: Chesley Mires MD, ABSM  Weight (lbs): 63<br>  RPSGT: Lanae Boast  BMI: 123<br>  MRN: [id number]  Neck Size: 295.00   CLINICAL INFORMATION  She has history of sleep apnea and has been on Bipap. Recent overnight oximetry with Bipap showed low oxygen level. She is referred for a Bipap titration study. SLEEP STUDY TECHNIQUE  As per the AASM Manual for the Scoring of Sleep and Associated Events v2.3 (April 2016) with a hypopnea requiring 4% desaturations. The channels recorded and monitored were frontal, central and occipital EEG, electrooculogram (EOG), submentalis EMG (chin), nasal and oral airflow, thoracic and abdominal wall motion, anterior tibialis EMG, snore microphone, electrocardiogram, and pulse oximetry. Bilevel positive airway pressure (BPAP) was initiated at the beginning of the study and titrated to treat sleep-disordered breathing. MEDICATIONS  Medications self-administered by patient taken the night of the study : N/A RESPIRATORY PARAMETERS  Optimal IPAP Pressure (cm): 16 AHI at Optimal Pressure (/hr) 0.0  Optimal EPAP Pressure (cm): 12      Overall Minimal O2 (%): 91.0 Minimal O2 at Optimal Pressure (%): 93.0  SLEEP ARCHITECTURE  Start Time: 11:22:03 PM Stop Time: 5:24:15 AM Total Time (min): 362.2 Total Sleep Time (min): 288.0  Sleep Latency (min): 9.6 Sleep Efficiency (%): 79.5% REM Latency (min): 95.0 WASO (min): 64.6  Stage N1 (%): 3.6% Stage N2 (%): 64.8% Stage N3 (%): 0.0% Stage R (%): 31.60  Supine (%): 0.00 Arousal Index (/hr): 11.7          CARDIAC DATA  The 2 lead EKG demonstrated sinus rhythm. The mean heart rate was 74.2 beats per minute. Other EKG findings include: PVCs.  LEG MOVEMENT DATA  The total Periodic Limb Movements of Sleep (PLMS)  were 0. The PLMS index was 0.0. A PLMS index of <15 is considered normal in adults. IMPRESSIONS  - She did well with Bipap 16/12 cm H2O. She did not require supplemental oxygen during this study. DIAGNOSIS  - Obstructive Sleep Apnea (327.23 [G47.33 ICD-10]) RECOMMENDATIONS  - Trial of BiPAP therapy on 16/12 cm H2O with a Medium size Resmed Full Face Mask Mirage Quattro mask and heated humidification.  [Electronically signed] 04/10/2017 02:32 PM Chesley Mires MD, [place], American Board of Sleep Medicine  NPI: [id number]

## 2017-04-10 NOTE — Telephone Encounter (Signed)
Called and spoke with Estill Bamberg she states that patient has a CPAP air mini and she is supposed to have a Bipap. Estill Bamberg states that she is having that fixed and patient will get the correct machine tomorrow 04/11/17   Patient aware. Nothing further needed.

## 2017-04-10 NOTE — Telephone Encounter (Signed)
Please check with her DME.  At her last visit with me on 03/05/17 the download from her machine indicated that she was using a Bipap machine.

## 2017-04-10 NOTE — Telephone Encounter (Signed)
Called and spoke with patient. Patient reported that she did the titration study on a bipap but the machine she has at home is a cpap machine.  She would like to know if the DME company will be changing her machine.  Dr. [name] please advise.  Routing to Dr. [name] and Vida Roller

## 2017-04-10 NOTE — Telephone Encounter (Signed)
Bipap 04/03/17 >> Bipap 16/12 cm H2O >> AHI 0, didn't need supplemental oxygen    Please let he know she did well with Bipap setting of 16/12 cm H2O and at this setting she didn't need to use supplemental oxygen.  I have sent order to her DME to have [place] setting changed.

## 2017-04-11 ENCOUNTER — Other Ambulatory Visit: Payer: Self-pay | Admitting: Cardiology

## 2017-04-17 ENCOUNTER — Ambulatory Visit: Payer: 59 | Admitting: Internal Medicine

## 2017-04-17 ENCOUNTER — Telehealth: Payer: Self-pay | Admitting: Nurse Practitioner

## 2017-04-17 NOTE — Telephone Encounter (Signed)
Patient states she is still having stomach problems consisting of bloating after eating and watery stool. Patient scheduled for pv and colon already but wants advice till then.

## 2017-04-17 NOTE — Telephone Encounter (Signed)
The patient is on Keflex presently. She has developed loose stool and bloating. She will hold a couple of doses of Miralax. Finishes the antibiotic tomorrow. She will eat bland foods for today. No spicy or fried foods. Follow up phone call if not improving tomorrow. Cautioned about returning to Miralax to prevent furure constipation.

## 2017-04-18 ENCOUNTER — Emergency Department (HOSPITAL_COMMUNITY): Payer: 59

## 2017-04-18 ENCOUNTER — Encounter (HOSPITAL_COMMUNITY): Payer: Self-pay | Admitting: *Deleted

## 2017-04-18 ENCOUNTER — Emergency Department (HOSPITAL_COMMUNITY)
Admission: EM | Admit: 2017-04-18 | Discharge: 2017-04-18 | Disposition: A | Discharge status: Home / Self Care | Payer: 59 | Attending: Emergency Medicine | Admitting: Emergency Medicine

## 2017-04-18 DIAGNOSIS — I2510 Atherosclerotic heart disease of native coronary artery without angina pectoris: Secondary | ICD-10-CM | POA: Insufficient documentation

## 2017-04-18 DIAGNOSIS — R197 Diarrhea, unspecified: Secondary | ICD-10-CM | POA: Insufficient documentation

## 2017-04-18 DIAGNOSIS — Z7982 Long term (current) use of aspirin: Secondary | ICD-10-CM | POA: Insufficient documentation

## 2017-04-18 DIAGNOSIS — Z79899 Other long term (current) drug therapy: Secondary | ICD-10-CM | POA: Insufficient documentation

## 2017-04-18 DIAGNOSIS — J449 Chronic obstructive pulmonary disease, unspecified: Secondary | ICD-10-CM | POA: Diagnosis not present

## 2017-04-18 DIAGNOSIS — I5032 Chronic diastolic (congestive) heart failure: Secondary | ICD-10-CM | POA: Insufficient documentation

## 2017-04-18 DIAGNOSIS — I130 Hypertensive heart and chronic kidney disease with heart failure and stage 1 through stage 4 chronic kidney disease, or unspecified chronic kidney disease: Secondary | ICD-10-CM | POA: Insufficient documentation

## 2017-04-18 DIAGNOSIS — R1084 Generalized abdominal pain: Secondary | ICD-10-CM | POA: Diagnosis present

## 2017-04-18 DIAGNOSIS — N183 Chronic kidney disease, stage 3 (moderate): Secondary | ICD-10-CM | POA: Diagnosis not present

## 2017-04-18 LAB — LIPASE, BLOOD: Lipase: 46 U/L (ref 11–51)

## 2017-04-18 LAB — URINALYSIS, ROUTINE W REFLEX MICROSCOPIC
Bilirubin Urine: NEGATIVE
Glucose, UA: NEGATIVE mg/dL
HGB URINE DIPSTICK: NEGATIVE
Ketones, ur: NEGATIVE mg/dL
LEUKOCYTES UA: NEGATIVE
NITRITE: NEGATIVE
Protein, ur: NEGATIVE mg/dL
SPECIFIC GRAVITY, URINE: 1.006 (ref 1.005–1.030)
pH: 6 (ref 5.0–8.0)

## 2017-04-18 LAB — I-STAT BETA HCG BLOOD, ED (MC, WL, AP ONLY)

## 2017-04-18 LAB — COMPREHENSIVE METABOLIC PANEL WITH GFR
ALT: 21 U/L (ref 14–54)
AST: 21 U/L (ref 15–41)
Albumin: 3.6 g/dL (ref 3.5–5.0)
Alkaline Phosphatase: 89 U/L (ref 38–126)
Anion gap: 13 (ref 5–15)
BUN: 21 mg/dL — ABNORMAL HIGH (ref 6–20)
CO2: 27 mmol/L (ref 22–32)
Calcium: 9.4 mg/dL (ref 8.9–10.3)
Chloride: 100 mmol/L — ABNORMAL LOW (ref 101–111)
Creatinine, Ser: 0.95 mg/dL (ref 0.44–1.00)
GFR calc Af Amer: >60 mL/min
GFR calc non Af Amer: >60 mL/min
Glucose, Bld: 111 mg/dL — ABNORMAL HIGH (ref 65–99)
Potassium: 3.3 mmol/L — ABNORMAL LOW (ref 3.5–5.1)
Sodium: 140 mmol/L (ref 135–145)
Total Bilirubin: 0.7 mg/dL (ref 0.3–1.2)
Total Protein: 8.4 g/dL — ABNORMAL HIGH (ref 6.5–8.1)

## 2017-04-18 LAB — CBC
HCT: 36.2 % (ref 36.0–46.0)
Hemoglobin: 11.8 g/dL — ABNORMAL LOW (ref 12.0–15.0)
MCH: 31 pg (ref 26.0–34.0)
MCHC: 32.6 g/dL (ref 30.0–36.0)
MCV: 95 fL (ref 78.0–100.0)
Platelets: 308 K/uL (ref 150–400)
RBC: 3.81 MIL/uL — ABNORMAL LOW (ref 3.87–5.11)
RDW: 13.8 % (ref 11.5–15.5)
WBC: 13.4 K/uL — ABNORMAL HIGH (ref 4.0–10.5)

## 2017-04-18 MED ORDER — IOPAMIDOL (ISOVUE-300) INJECTION 61%
INTRAVENOUS | Status: AC
  Filled 2017-04-18: qty 100

## 2017-04-18 MED ORDER — TRAMADOL HCL 50 MG PO TABS: 50.0000 mg | ORAL_TABLET | Freq: Four times a day (QID) | ORAL | 0 refills | Status: DC | PRN

## 2017-04-18 MED ORDER — SODIUM CHLORIDE 0.9 % IV BOLUS
500.0000 mL | Freq: Once | INTRAVENOUS | Status: AC
  Administered 2017-04-18: 500 mL via INTRAVENOUS

## 2017-04-18 MED ORDER — HYDROMORPHONE HCL 1 MG/ML IJ SOLN
1.0000 mg | Freq: Once | INTRAMUSCULAR | Status: AC
  Administered 2017-04-18: 1 mg via INTRAVENOUS
  Filled 2017-04-18: qty 1

## 2017-04-18 MED ORDER — PROMETHAZINE HCL 25 MG PO TABS: 25.0000 mg | ORAL_TABLET | Freq: Four times a day (QID) | ORAL | 0 refills | Status: DC | PRN

## 2017-04-18 MED ORDER — MORPHINE SULFATE (PF) 4 MG/ML IV SOLN
4.0000 mg | Freq: Once | INTRAVENOUS | Status: AC
  Administered 2017-04-18: 4 mg via INTRAVENOUS
  Filled 2017-04-18: qty 1

## 2017-04-18 MED ORDER — ONDANSETRON HCL 4 MG/2ML IJ SOLN
4.0000 mg | Freq: Once | INTRAMUSCULAR | Status: AC
  Administered 2017-04-18: 4 mg via INTRAVENOUS
  Filled 2017-04-18: qty 2

## 2017-04-18 MED ORDER — IOPAMIDOL (ISOVUE-300) INJECTION 61%
100.0000 mL | Freq: Once | INTRAVENOUS | Status: AC | PRN
  Administered 2017-04-18: 100 mL via INTRAVENOUS

## 2017-04-18 MED ORDER — GI COCKTAIL ~~LOC~~
30.0000 mL | Freq: Once | ORAL | Status: AC
  Administered 2017-04-18: 30 mL via ORAL
  Filled 2017-04-18: qty 30

## 2017-04-18 NOTE — Telephone Encounter (Signed)
Spoke with the patient. No diarrhea today. Passed soft stool this morning which temporarily made her feel better. Her abdomen hurts in the RLQ below her ribs. It feels better if she pushes in on the area. Afebrile. No nausea. No blood with bowel movement. No Miralax since yesterday. Suggestions? No APP appointments today. I put her on for next available which is 04/22/17 with Tye Savoy , NP. Her colonoscopy is at the hospital this month.

## 2017-04-18 NOTE — ED Triage Notes (Signed)
Pt complains of diarrhea and right sided flank pain x 5 days. Pt states pain is worse with eating.

## 2017-04-18 NOTE — Discharge Instructions (Signed)
Return here as needed. Follow up with your doctor. °

## 2017-04-18 NOTE — ED Provider Notes (Signed)
[facility] DEPT Provider Note   CSN: [id number] Arrival date & time: 04/18/17  1131     History   Chief Complaint Chief Complaint  Patient presents with  . Diarrhea  . Flank Pain    HPI [name] is a 57 y.o. female.  HPI Patient presents to the emergency department with right flank and abdominal pain.  The patient states that this started 5 days ago.  The patient states that it seems to be worse with eating and drinking.  Patient states that she did not take any medications prior to arrival for her symptoms.  Patient states she has had some intermittent abdominal issues over the last few years.  Patient states that she has had no recent abdominal surgeries the patient denies chest pain, shortness of breath, headache,blurred vision, neck pain, fever, cough, weakness, numbness, dizziness, anorexia, edema,  vomiting,  rash, back pain, dysuria, hematemesis, bloody stool, near syncope, or syncope. Past Medical History:  Diagnosis Date  . Acute on chronic diastolic CHF (congestive heart failure) ([place]) 04/11/2014  . Acute suppurative otitis media without spontaneous rupture of eardrum 02/04/2007   Centricity Description: OTITIS MEDIA, SUPPURATIVE, ACUTE, BILATERAL Qualifier: Diagnosis of  By: [name] MD, [name]  Centricity Description: OTITIS MEDIA, SUPPURATIVE, ACUTE Qualifier: Diagnosis of  By: [name] MD, [name]   . Allergic rhinitis   . CAD (coronary artery disease)    LHC (11/29/1999):  EF 60%; no significant CAD.  [name] Carpal tunnel syndrome   . Carpal tunnel syndrome 06/09/2006   Qualifier: Diagnosis of  By: Marca Ancona RMA, Lucy    . Chronic diastolic CHF (congestive heart failure) ([place])    a. Echo (01/21/11): Vigorous LVF, EF 65-70%, no RWMA, Gr 2 DD. b.  Echo (12/15): Moderate LVH, EF 96-29%, grade 2 diastolic dysfunction, mild LAE, normal RV function c. 01/2015: echo showing EF of 60-65% with mild LVH  . Common migraine   . COPD (chronic obstructive  pulmonary disease) ([place])   . Diastolic heart failure ([place])   . Diverticulitis   . Hepatic steatosis   . Hepatomegaly   . Hiatal hernia   . Hyperlipidemia   . Hypertension   . IBS (irritable bowel syndrome)   . Internal hemorrhoids   . Morbid obesity ([place])   . OSA (obstructive sleep apnea)    CPAP dependent  . PNEUMONIA, ORGAN UNSPECIFIED 03/01/2009   Qualifier: Diagnosis of  By: [name] CMA, [name]    . Sinusitis, chronic 03/10/2012   CT sinuses 02/2012:  Chronic sinusitis with small A/F levels >> rx by ENT with prolonged augmentin F/u ct sinuses 04/2012:  Persistent sinus thickening >> rx with levaquin and clinda for 30 days.  Told to return for sinus scan in 6 weeks >> no showed for followup visit.  CT sinuses 11/2013:  Acute and chronic sinusitis noted.      Patient Active Problem List   Diagnosis Date Noted  . CKD (chronic kidney disease) stage 3, GFR 30-59 ml/min (HCC) 07/08/2016  . Cough variant asthma 07/31/2015  . Upper airway cough syndrome 04/11/2015  . Bronchitis, chronic obstructive w acute bronchitis ([place]) 04/11/2015  . Dyspnea   . Acute on chronic diastolic CHF (congestive heart failure), NYHA class 2 ([place]) 03/27/2015  . Morbid obesity due to excess calories ([place]) 03/27/2015  . Dysphagia 03/27/2015  . Hyponatremia 03/27/2015  . Acute kidney injury ([place]) 03/27/2015  . Hypokalemia 03/27/2015  . Leukocytosis 03/27/2015  . COPD exacerbation ([place]) 03/27/2015  . Sinusitis,  chronic 03/10/2012  . Hyperlipidemia 10/02/2006  . Obstructive sleep apnea 06/09/2006  . Essential hypertension 06/09/2006    Past Surgical History:  Procedure Laterality Date  . ABDOMINAL HYSTERECTOMY    . CESAREAN SECTION    . NASAL SINUS SURGERY    . PARTIAL HYSTERECTOMY    . TONSILLECTOMY    . TUBAL LIGATION    . UVULOPALATOPHARYNGOPLASTY       OB History   None      Home Medications    Prior to Admission medications   Medication Sig Start Date End Date Taking? Authorizing  Provider  albuterol (PROVENTIL) (2.5 MG/3ML) 0.083% nebulizer solution Take 3 mLs (2.5 mg total) by nebulization every 4 (four) hours as needed for wheezing or shortness of breath. 11/08/16  Yes Tanda Rockers, MD  Ascorbic Acid (VITAMIN C) 1000 MG tablet Take 1,000 mg by mouth daily.   Yes [provider]  aspirin 81 MG EC tablet TAKE 1 TABLET (81 MG TOTAL) BY MOUTH DAILY. 07/08/16  Yes Strader, Fransisco Hertz, PA-C  bacitracin-polymyxin b (POLYSPORIN) ophthalmic ointment 1 application. TO AFFECTED EYE TWO TIMES DAILY 04/06/17  Yes [provider]  benzonatate (TESSALON) 200 MG capsule TAKE 1 CAPSULE(200 MG) BY MOUTH THREE TIMES DAILY AS NEEDED FOR COUGH 04/02/17  Yes Chesley Mires, MD  carvedilol (COREG) 12.5 MG tablet Take 12.5 mg by mouth 2 (two) times daily with a meal.   Yes [provider]  chlorpheniramine (CHLOR-TRIMETON) 4 MG tablet Take 2 tablets by mouth at bedtime.  May add 1 extra every 4 hours as needed for drainage, drippy nose, throat clearing   Yes [provider]  Dextromethorphan-Guaifenesin (MUCINEX DM MAXIMUM STRENGTH) 60-1200 MG TB12 Take 1 tablet by mouth 2 (two) times daily as needed (cough).    Yes [provider]  Diclofenac Sodium 3 % GEL Apply 1 application topically 3 (three) times daily. Knee pain   Yes [provider]  dicyclomine (BENTYL) 10 MG capsule TAKE 1 CAPSULE(10 MG) BY MOUTH FOUR TIMES DAILY BEFORE MEALS AND AT BEDTIME 12/04/15  Yes Irene Shipper, MD  famotidine (PEPCID) 20 MG tablet TAKE 1 TABLET BY MOUTH EVERY NIGHT AT BEDTIME 02/26/17  Yes Tanda Rockers, MD  fluticasone (FLONASE) 50 MCG/ACT nasal spray PLACE 2 SPRAYS INTO BOTH NOSTRILS DAILY. 03/05/17  Yes Chesley Mires, MD  hydrALAZINE (APRESOLINE) 25 MG tablet Take 1 tablet (25 mg total) by mouth 3 (three) times daily. 03/27/17 06/25/17 Yes Lendon Colonel, NP  isosorbide dinitrate (ISORDIL) 20 MG tablet Take 20 mg by mouth daily. 04/01/17  Yes [provider]  lidocaine (LIDODERM) 5 % Place 1 patch onto the skin daily. Remove & Discard patch within 12 hours or as directed by MD 03/22/17  Yes Antonietta Breach, PA-C  losartan (COZAAR) 100 MG tablet TAKE 1 TABLET (100 MG TOTAL) BY MOUTH DAILY. 04/11/17  Yes Minus Breeding, MD  meloxicam (MOBIC) 15 MG tablet Take 15 mg by mouth daily. 03/31/17  Yes [provider]  montelukast (SINGULAIR) 10 MG tablet Take 1 tablet (10 mg total) by mouth at bedtime. 10/15/16  Yes McQuaid, Ronie Spies, MD  pantoprazole (PROTONIX) 40 MG tablet TAKE 1 TABLET BY MOUTH DAILY, TAKE 30 TO 60 MINUTES BEFORE FIRST MEAL OF THE DAY 03/25/17  Yes Nandigam, Venia Minks, MD  polyethylene glycol powder (GLYCOLAX/MIRALAX) powder Use 2 capfuls in the morning and 1 capful in the evening and as directed 03/24/17  Yes Nandigam, Venia Minks, MD  potassium  chloride SA (K-DUR,KLOR-CON) 20 MEQ tablet TAKE 3 TABLETS BY MOUTH THREE TIMES DAILY Patient taking differently: TAKE 3 TABLETS (60 MEQ) BY MOUTH THREE TIMES DAILY 03/03/17  Yes Minus Breeding, MD  predniSONE (DELTASONE) 20 MG tablet Take 20 mg by mouth daily. 04/15/17  Yes [provider]  spironolactone (ALDACTONE) 25 MG tablet TAKE 0.5 TABLETS (12.5 MG TOTAL) BY MOUTH 2 (TWO) TIMES DAILY. 10/22/16  Yes Skeet Latch, MD  torsemide (DEMADEX) 20 MG tablet Take 2 tablets (40 mg total) by mouth 2 (two) times daily. 03/14/17 04/18/17 Yes Skeet Latch, MD  traMADol (ULTRAM) 50 MG tablet Take 1 tablet (50 mg total) by mouth every 6 (six) hours as needed for moderate pain. 03/24/17  Yes Tanda Rockers, MD  gabapentin (NEURONTIN) 100 MG capsule Take 1 capsule (100 mg total) by mouth 3 (three) times daily. One three times daily Patient not taking: Reported on 04/18/2017 02/07/17   Tanda Rockers, MD  Respiratory Therapy Supplies (FLUTTER) DEVI 1 application by Does not apply route as directed. 12/27/16   Tanda Rockers, MD    Family History Family History  Problem Relation Age of  Onset  . Heart disease Mother   . Heart attack Mother   . Hypertension Mother   . Coronary artery disease Unknown        1st degree relatvie <50  . Breast cancer Unknown        aunt- ? paternal or maternal  . Asthma Sister   . Stroke Neg Hx     Social History Social History   Tobacco Use  . Smoking status: Never Smoker  . Smokeless tobacco: Never Used  Substance Use Topics  . Alcohol use: No  . Drug use: No     Allergies   Sulfonamide derivatives; Doxycycline; Latex; Ciprofloxacin; Fish allergy; Hydrocortisone; and Neomycin   Review of Systems Review of Systems All other systems negative except as documented in the HPI. All pertinent positives and negatives as reviewed in the HPI.  Physical Exam Updated Vital Signs BP 135/63 (BP Location: Left Wrist)   Pulse (!) 55   Temp 98.1 F (36.7 C) (Oral)   Resp 18   SpO2 94%   Physical Exam  Constitutional: She is oriented to person, place, and time. She appears well-developed and well-nourished. No distress.  HENT:  Head: Normocephalic and atraumatic.  Mouth/Throat: Oropharynx is clear and moist.  Eyes: Pupils are equal, round, and reactive to light.  Neck: Normal range of motion. Neck supple.  Cardiovascular: Normal rate, regular rhythm and normal heart sounds. Exam reveals no gallop and no friction rub.  No murmur heard. Pulmonary/Chest: Effort normal and breath sounds normal. No respiratory distress. She has no wheezes.  Abdominal: Soft. Bowel sounds are normal. She exhibits no distension and no mass. There is no hepatosplenomegaly. There is generalized tenderness. There is no rebound and no guarding. No hernia.  Neurological: She is alert and oriented to person, place, and time. She exhibits normal muscle tone. Coordination normal.  Skin: Skin is warm and dry. Capillary refill takes less than 2 seconds. No rash noted. No erythema.  Psychiatric: She has a normal mood and affect. Her behavior is normal.  Nursing note  and vitals reviewed.    ED Treatments / Results  Labs (all labs ordered are listed, but only abnormal results are displayed) Labs Reviewed  COMPREHENSIVE METABOLIC PANEL - Abnormal; Notable for the following components:      Result Value   Potassium 3.3 (*)  Chloride 100 (*)    Glucose, Bld 111 (*)    BUN 21 (*)    Total Protein 8.4 (*)    All other components within normal limits  CBC - Abnormal; Notable for the following components:   WBC 13.4 (*)    RBC 3.81 (*)    Hemoglobin 11.8 (*)    All other components within normal limits  URINALYSIS, ROUTINE W REFLEX MICROSCOPIC - Abnormal; Notable for the following components:   Color, Urine STRAW (*)    All other components within normal limits  LIPASE, BLOOD  I-STAT BETA HCG BLOOD, ED ([facility], [facility], [facility] ONLY)    EKG None  Radiology Ct Abdomen Pelvis W Contrast  Result Date: 04/18/2017 CLINICAL DATA:  Acute right flank pain. EXAM: CT ABDOMEN AND PELVIS WITH CONTRAST TECHNIQUE: Multidetector CT imaging of the abdomen and pelvis was performed using the standard protocol following bolus administration of intravenous contrast. CONTRAST:  164mL ISOVUE-300 IOPAMIDOL (ISOVUE-300) INJECTION 61% COMPARISON:  CT scan of June 26, 2016. FINDINGS: Lower chest: No acute abnormality. Hepatobiliary: No focal liver abnormality is seen. No gallstones, gallbladder wall thickening, or biliary dilatation. Pancreas: Unremarkable. No pancreatic ductal dilatation or surrounding inflammatory changes. Spleen: Normal in size without focal abnormality. Adrenals/Urinary Tract: Adrenal glands are unremarkable. Kidneys are normal, without renal calculi, focal lesion, or hydronephrosis. Bladder is unremarkable. Stomach/Bowel: Stomach is within normal limits. Appendix appears normal. No evidence of bowel wall thickening, distention, or inflammatory changes. Vascular/Lymphatic: No significant vascular findings are present. No enlarged abdominal or pelvic lymph nodes.  Reproductive: Status post hysterectomy. No adnexal masses. Other: Moderate-sized fat containing periumbilical hernia is noted. Stable calcification is noted adjacent to proximal right ureter which most likely is venous in origin. Musculoskeletal: No acute or significant osseous findings. IMPRESSION: Moderate size fat containing periumbilical hernia. No other significant abnormality seen in the abdomen or pelvis. Electronically Signed   By: Marijo Conception, M.D.   On: 04/18/2017 19:16    Procedures Procedures (including critical care time)  Medications Ordered in ED Medications  iopamidol (ISOVUE-300) 61 % injection 100 mL (100 mLs Intravenous Contrast Given 04/18/17 1853)  sodium chloride 0.9 % bolus 500 mL (0 mLs Intravenous Stopped 04/18/17 2128)  morphine 4 MG/ML injection 4 mg (4 mg Intravenous Given 04/18/17 2027)  ondansetron (ZOFRAN) injection 4 mg (4 mg Intravenous Given 04/18/17 2027)  gi cocktail (Maalox,Lidocaine,Donnatal) (30 mLs Oral Given 04/18/17 2027)  HYDROmorphone (DILAUDID) injection 1 mg (1 mg Intravenous Given 04/18/17 2213)     Initial Impression / Assessment and Plan / ED Course  I have reviewed the triage vital signs and the nursing notes.  Pertinent labs & imaging results that were available during my care of the patient were reviewed by me and considered in my medical decision making (see chart for details).     The patient has generalized abdominal pain there is seemingly more pain in the right flank region.  I did advise patient she will need to follow-up with her GI doctor along with her primary doctor.  The patient has been stable vital sign wise here in the emergency department.  Patient agrees the plan and all questions were answered.  We do not have a significant cause for her abdominal pain based on her testing.  I did advise the patient this could be an evolving process and that she would need to return here for any worsening in her condition patient agrees to the  plan and all questions were answered.  Final  Clinical Impressions(s) / ED Diagnoses   Final diagnoses:  None    ED Discharge Orders    None       Dalia Heading, PA-C 04/22/17 1624    Drenda Freeze, MD 04/23/17 1900

## 2017-04-18 NOTE — Telephone Encounter (Signed)
Discussed with the patient. She states she has a "kidney doctor" that she will contact with her symptoms. Appointment on 05/19/17 cancelled.

## 2017-04-18 NOTE — ED Notes (Signed)
Patient transported to CT 

## 2017-04-18 NOTE — Telephone Encounter (Signed)
Pt states she is not feeling any better states is having right sided abdominal pain and states she needs to be seen soon. Best call back # [phone].

## 2017-04-18 NOTE — Telephone Encounter (Signed)
She has chronic intermittent R side abdominal pain and has had CT abd & pelvis and also MRI abdomen within past 6 months. ?kidney vs retroperitoneal calcification on the R side based on imaging. Drink 10-12 cups of water. Tylenol 500mg  q8h as needed. Follow up with PMD.

## 2017-04-19 ENCOUNTER — Encounter (HOSPITAL_COMMUNITY): Payer: Self-pay | Admitting: Emergency Medicine

## 2017-04-19 ENCOUNTER — Emergency Department (HOSPITAL_COMMUNITY)
Admission: EM | Admit: 2017-04-19 | Discharge: 2017-04-19 | Disposition: A | Discharge status: Home / Self Care | Payer: 59 | Attending: Emergency Medicine | Admitting: Emergency Medicine

## 2017-04-19 ENCOUNTER — Emergency Department (HOSPITAL_COMMUNITY): Payer: 59

## 2017-04-19 DIAGNOSIS — I130 Hypertensive heart and chronic kidney disease with heart failure and stage 1 through stage 4 chronic kidney disease, or unspecified chronic kidney disease: Secondary | ICD-10-CM | POA: Insufficient documentation

## 2017-04-19 DIAGNOSIS — I5032 Chronic diastolic (congestive) heart failure: Secondary | ICD-10-CM | POA: Diagnosis not present

## 2017-04-19 DIAGNOSIS — N183 Chronic kidney disease, stage 3 (moderate): Secondary | ICD-10-CM | POA: Diagnosis not present

## 2017-04-19 DIAGNOSIS — Z7982 Long term (current) use of aspirin: Secondary | ICD-10-CM | POA: Diagnosis not present

## 2017-04-19 DIAGNOSIS — J449 Chronic obstructive pulmonary disease, unspecified: Secondary | ICD-10-CM | POA: Insufficient documentation

## 2017-04-19 DIAGNOSIS — R197 Diarrhea, unspecified: Secondary | ICD-10-CM | POA: Diagnosis not present

## 2017-04-19 DIAGNOSIS — R1011 Right upper quadrant pain: Secondary | ICD-10-CM | POA: Insufficient documentation

## 2017-04-19 DIAGNOSIS — Z79899 Other long term (current) drug therapy: Secondary | ICD-10-CM | POA: Diagnosis not present

## 2017-04-19 DIAGNOSIS — R112 Nausea with vomiting, unspecified: Secondary | ICD-10-CM | POA: Insufficient documentation

## 2017-04-19 DIAGNOSIS — I2510 Atherosclerotic heart disease of native coronary artery without angina pectoris: Secondary | ICD-10-CM | POA: Diagnosis not present

## 2017-04-19 DIAGNOSIS — Z91040 Latex allergy status: Secondary | ICD-10-CM | POA: Insufficient documentation

## 2017-04-19 LAB — I-STAT BETA HCG BLOOD, ED (MC, WL, AP ONLY)

## 2017-04-19 LAB — COMPREHENSIVE METABOLIC PANEL
ALBUMIN: 3.7 g/dL (ref 3.5–5.0)
ALK PHOS: 91 U/L (ref 38–126)
ALT: 19 U/L (ref 14–54)
AST: 23 U/L (ref 15–41)
Anion gap: 10 (ref 5–15)
BILIRUBIN TOTAL: 0.6 mg/dL (ref 0.3–1.2)
BUN: 19 mg/dL (ref 6–20)
CALCIUM: 9.1 mg/dL (ref 8.9–10.3)
CO2: 28 mmol/L (ref 22–32)
CREATININE: 1.01 mg/dL — AB (ref 0.44–1.00)
Chloride: 101 mmol/L (ref 101–111)
GFR calc Af Amer: >60 mL/min (ref >60)
GFR calc non Af Amer: >60 mL/min (ref >60)
GLUCOSE: 92 mg/dL (ref 65–99)
Potassium: 3.4 mmol/L — ABNORMAL LOW (ref 3.5–5.1)
SODIUM: 139 mmol/L (ref 135–145)
Total Protein: 8.1 g/dL (ref 6.5–8.1)

## 2017-04-19 LAB — CBC
HCT: 37.8 % (ref 36.0–46.0)
Hemoglobin: 11.7 g/dL — ABNORMAL LOW (ref 12.0–15.0)
MCH: 29.8 pg (ref 26.0–34.0)
MCHC: 31 g/dL (ref 30.0–36.0)
MCV: 96.4 fL (ref 78.0–100.0)
PLATELETS: 330 10*3/uL (ref 150–400)
RBC: 3.92 MIL/uL (ref 3.87–5.11)
RDW: 13.9 % (ref 11.5–15.5)
WBC: 12.4 10*3/uL — ABNORMAL HIGH (ref 4.0–10.5)

## 2017-04-19 LAB — URINALYSIS, ROUTINE W REFLEX MICROSCOPIC
BILIRUBIN URINE: NEGATIVE
Glucose, UA: NEGATIVE mg/dL
HGB URINE DIPSTICK: NEGATIVE
KETONES UR: NEGATIVE mg/dL
Leukocytes, UA: NEGATIVE
Nitrite: NEGATIVE
PROTEIN: NEGATIVE mg/dL
SPECIFIC GRAVITY, URINE: 1.018 (ref 1.005–1.030)
pH: 5 (ref 5.0–8.0)

## 2017-04-19 LAB — LIPASE, BLOOD: Lipase: 55 U/L — ABNORMAL HIGH (ref 11–51)

## 2017-04-19 MED ORDER — MORPHINE SULFATE (PF) 4 MG/ML IV SOLN
4.0000 mg | Freq: Once | INTRAVENOUS | Status: AC
  Administered 2017-04-19: 4 mg via INTRAVENOUS
  Filled 2017-04-19: qty 1

## 2017-04-19 MED ORDER — SODIUM CHLORIDE 0.9 % IV BOLUS
500.0000 mL | Freq: Once | INTRAVENOUS | Status: AC
  Administered 2017-04-19: 500 mL via INTRAVENOUS

## 2017-04-19 NOTE — ED Provider Notes (Signed)
[facility] DEPT Provider Note   CSN: [id number] Arrival date & time: 04/19/17  1275     History   Chief Complaint Chief Complaint  Patient presents with  . Abdominal Pain  . Near Syncope    HPI [name] is a 57 y.o. female.  The history is provided by the patient and medical records.  Abdominal Pain   This is a new problem. The current episode started more than 2 days ago (6 days). The problem occurs constantly. The problem has not changed since onset.The pain is associated with an unknown factor. The pain is located in the RUQ. The quality of the pain is aching and cramping. The pain is moderate. Associated symptoms include anorexia, belching and diarrhea. Pertinent negatives include fever, melena, nausea, vomiting, constipation, dysuria, frequency and headaches. The symptoms are aggravated by palpation and eating. Nothing relieves the symptoms. Past workup includes CT scan.  Near Syncope  Associated symptoms include abdominal pain. Pertinent negatives include no chest pain, no headaches and no shortness of breath.    Past Medical History:  Diagnosis Date  . Acute on chronic diastolic CHF (congestive heart failure) ([place]) 04/11/2014  . Acute suppurative otitis media without spontaneous rupture of eardrum 02/04/2007   Centricity Description: OTITIS MEDIA, SUPPURATIVE, ACUTE, BILATERAL Qualifier: Diagnosis of  By: [name] MD, [name]  Centricity Description: OTITIS MEDIA, SUPPURATIVE, ACUTE Qualifier: Diagnosis of  By: [name] MD, [name]   . Allergic rhinitis   . CAD (coronary artery disease)    LHC (11/29/1999):  EF 60%; no significant CAD.  [name] Carpal tunnel syndrome   . Carpal tunnel syndrome 06/09/2006   Qualifier: Diagnosis of  By: Marca Ancona RMA, Lucy    . Chronic diastolic CHF (congestive heart failure) ([place])    a. Echo (01/21/11): Vigorous LVF, EF 65-70%, no RWMA, Gr 2 DD. b.  Echo (12/15): Moderate LVH, EF 17-00%, grade 2 diastolic dysfunction,  mild LAE, normal RV function c. 01/2015: echo showing EF of 60-65% with mild LVH  . Common migraine   . COPD (chronic obstructive pulmonary disease) ([place])   . Diastolic heart failure ([place])   . Diverticulitis   . Hepatic steatosis   . Hepatomegaly   . Hiatal hernia   . Hyperlipidemia   . Hypertension   . IBS (irritable bowel syndrome)   . Internal hemorrhoids   . Morbid obesity ([place])   . OSA (obstructive sleep apnea)    CPAP dependent  . PNEUMONIA, ORGAN UNSPECIFIED 03/01/2009   Qualifier: Diagnosis of  By: [name] CMA, [name]    . Sinusitis, chronic 03/10/2012   CT sinuses 02/2012:  Chronic sinusitis with small A/F levels >> rx by ENT with prolonged augmentin F/u ct sinuses 04/2012:  Persistent sinus thickening >> rx with levaquin and clinda for 30 days.  Told to return for sinus scan in 6 weeks >> no showed for followup visit.  CT sinuses 11/2013:  Acute and chronic sinusitis noted.      Patient Active Problem List   Diagnosis Date Noted  . CKD (chronic kidney disease) stage 3, GFR 30-59 ml/min (HCC) 07/08/2016  . Cough variant asthma 07/31/2015  . Upper airway cough syndrome 04/11/2015  . Bronchitis, chronic obstructive w acute bronchitis ([place]) 04/11/2015  . Dyspnea   . Acute on chronic diastolic CHF (congestive heart failure), NYHA class 2 ([place]) 03/27/2015  . Morbid obesity due to excess calories ([place]) 03/27/2015  . Dysphagia 03/27/2015  . Hyponatremia 03/27/2015  . Acute kidney injury (  [place]) 03/27/2015  . Hypokalemia 03/27/2015  . Leukocytosis 03/27/2015  . COPD exacerbation ([place]) 03/27/2015  . Sinusitis, chronic 03/10/2012  . Hyperlipidemia 10/02/2006  . Obstructive sleep apnea 06/09/2006  . Essential hypertension 06/09/2006    Past Surgical History:  Procedure Laterality Date  . ABDOMINAL HYSTERECTOMY    . CESAREAN SECTION    . NASAL SINUS SURGERY    . PARTIAL HYSTERECTOMY    . TONSILLECTOMY    . TUBAL LIGATION    . UVULOPALATOPHARYNGOPLASTY       OB History     None      Home Medications    Prior to Admission medications   Medication Sig Start Date End Date Taking? Authorizing Provider  albuterol (PROVENTIL) (2.5 MG/3ML) 0.083% nebulizer solution Take 3 mLs (2.5 mg total) by nebulization every 4 (four) hours as needed for wheezing or shortness of breath. 11/08/16   Tanda Rockers, MD  Ascorbic Acid (VITAMIN C) 1000 MG tablet Take 1,000 mg by mouth daily.    [provider]  aspirin 81 MG EC tablet TAKE 1 TABLET (81 MG TOTAL) BY MOUTH DAILY. 07/08/16   Strader, Fransisco Hertz, PA-C  bacitracin-polymyxin b (POLYSPORIN) ophthalmic ointment 1 application. TO AFFECTED EYE TWO TIMES DAILY 04/06/17   [provider]  benzonatate (TESSALON) 200 MG capsule TAKE 1 CAPSULE(200 MG) BY MOUTH THREE TIMES DAILY AS NEEDED FOR COUGH 04/02/17   Chesley Mires, MD  carvedilol (COREG) 12.5 MG tablet Take 12.5 mg by mouth 2 (two) times daily with a meal.    [provider]  chlorpheniramine (CHLOR-TRIMETON) 4 MG tablet Take 2 tablets by mouth at bedtime.  May add 1 extra every 4 hours as needed for drainage, drippy nose, throat clearing    [provider]  Dextromethorphan-Guaifenesin (MUCINEX DM MAXIMUM STRENGTH) 60-1200 MG TB12 Take 1 tablet by mouth 2 (two) times daily as needed (cough).     [provider]  Diclofenac Sodium 3 % GEL Apply 1 application topically 3 (three) times daily. Knee pain    [provider]  dicyclomine (BENTYL) 10 MG capsule TAKE 1 CAPSULE(10 MG) BY MOUTH FOUR TIMES DAILY BEFORE MEALS AND AT BEDTIME 12/04/15   Irene Shipper, MD  famotidine (PEPCID) 20 MG tablet TAKE 1 TABLET BY MOUTH EVERY NIGHT AT BEDTIME 02/26/17   Tanda Rockers, MD  fluticasone (FLONASE) 50 MCG/ACT nasal spray PLACE 2 SPRAYS INTO BOTH NOSTRILS DAILY. 03/05/17   Chesley Mires, MD  gabapentin (NEURONTIN) 100 MG capsule Take 1 capsule (100 mg total) by mouth 3 (three) times daily. One three times daily Patient not taking: Reported  on 04/18/2017 02/07/17   Tanda Rockers, MD  hydrALAZINE (APRESOLINE) 25 MG tablet Take 1 tablet (25 mg total) by mouth 3 (three) times daily. 03/27/17 06/25/17  Lendon Colonel, NP  isosorbide dinitrate (ISORDIL) 20 MG tablet Take 20 mg by mouth daily. 04/01/17   [provider]  lidocaine (LIDODERM) 5 % Place 1 patch onto the skin daily. Remove & Discard patch within 12 hours or as directed by MD 03/22/17   Antonietta Breach, PA-C  losartan (COZAAR) 100 MG tablet TAKE 1 TABLET (100 MG TOTAL) BY MOUTH DAILY. 04/11/17   Minus Breeding, MD  meloxicam (MOBIC) 15 MG tablet Take 15 mg by mouth daily. 03/31/17   [provider]  montelukast (SINGULAIR) 10 MG tablet Take 1 tablet (10 mg total) by mouth at bedtime. 10/15/16   Juanito Doom, MD  pantoprazole (PROTONIX) 40 MG  tablet TAKE 1 TABLET BY MOUTH DAILY, TAKE 30 TO 60 MINUTES BEFORE FIRST MEAL OF THE DAY 03/25/17   Mauri Pole, MD  polyethylene glycol powder (GLYCOLAX/MIRALAX) powder Use 2 capfuls in the morning and 1 capful in the evening and as directed 03/24/17   Nandigam, Venia Minks, MD  potassium chloride SA (K-DUR,KLOR-CON) 20 MEQ tablet TAKE 3 TABLETS BY MOUTH THREE TIMES DAILY Patient taking differently: TAKE 3 TABLETS (60 MEQ) BY MOUTH THREE TIMES DAILY 03/03/17   Minus Breeding, MD  predniSONE (DELTASONE) 20 MG tablet Take 20 mg by mouth daily. 04/15/17   [provider]  promethazine (PHENERGAN) 25 MG tablet Take 1 tablet (25 mg total) by mouth every 6 (six) hours as needed for nausea or vomiting. 04/18/17   Dalia Heading, PA-C  Respiratory Therapy Supplies (FLUTTER) DEVI 1 application by Does not apply route as directed. 12/27/16   Tanda Rockers, MD  spironolactone (ALDACTONE) 25 MG tablet TAKE 0.5 TABLETS (12.5 MG TOTAL) BY MOUTH 2 (TWO) TIMES DAILY. 10/22/16   Skeet Latch, MD  torsemide (DEMADEX) 20 MG tablet Take 2 tablets (40 mg total) by mouth 2 (two) times daily. 03/14/17 04/18/17  Skeet Latch, MD   traMADol (ULTRAM) 50 MG tablet Take 1 tablet (50 mg total) by mouth every 6 (six) hours as needed for severe pain. 04/18/17   Dalia Heading, PA-C    Family History Family History  Problem Relation Age of Onset  . Heart disease Mother   . Heart attack Mother   . Hypertension Mother   . Coronary artery disease Unknown        1st degree relatvie <50  . Breast cancer Unknown        aunt- ? paternal or maternal  . Asthma Sister   . Stroke Neg Hx     Social History Social History   Tobacco Use  . Smoking status: Never Smoker  . Smokeless tobacco: Never Used  Substance Use Topics  . Alcohol use: No  . Drug use: No     Allergies   Sulfonamide derivatives; Doxycycline; Latex; Ciprofloxacin; Fish allergy; Hydrocortisone; and Neomycin   Review of Systems Review of Systems  Constitutional: Positive for appetite change. Negative for chills, diaphoresis, fatigue and fever.  HENT: Negative for congestion and rhinorrhea.   Eyes: Negative for visual disturbance.  Respiratory: Negative for cough, chest tightness, shortness of breath and wheezing.   Cardiovascular: Positive for near-syncope. Negative for chest pain and leg swelling.  Gastrointestinal: Positive for abdominal pain, anorexia and diarrhea. Negative for abdominal distention, blood in stool, constipation, melena, nausea and vomiting.  Genitourinary: Negative for dysuria, flank pain and frequency.  Musculoskeletal: Negative for back pain, neck pain and neck stiffness.  Skin: Negative for rash and wound.  Neurological: Positive for light-headedness. Negative for syncope, speech difficulty, numbness and headaches.  Psychiatric/Behavioral: Negative for agitation and confusion.  All other systems reviewed and are negative.    Physical Exam Updated Vital Signs BP 131/69 (BP Location: Left Arm)   Pulse (!) 57   Temp 98.4 F (36.9 C) (Oral)   Resp 19   Ht 5\' 3"  (1.6 m)   Wt 133.8 kg (295 lb)   SpO2 94%   BMI 52.26  kg/m   Physical Exam  Constitutional: She is oriented to person, place, and time. She appears well-developed and well-nourished.  Non-toxic appearance. She does not appear ill. No distress.  HENT:  Head: Normocephalic and atraumatic.  Nose: Nose normal.  Mouth/Throat: Oropharynx is  clear and moist. No oropharyngeal exudate.  Eyes: Pupils are equal, round, and reactive to light. EOM are normal.  Neck: Normal range of motion.  Cardiovascular: Normal rate and intact distal pulses.  No murmur heard. Pulmonary/Chest: Effort normal. No respiratory distress. She has no wheezes. She has no rales. She exhibits no tenderness.  Abdominal: Soft. Bowel sounds are normal. She exhibits no distension. There is tenderness. There is no guarding.  Musculoskeletal: Normal range of motion. She exhibits no edema or tenderness.  Neurological: She is alert and oriented to person, place, and time.  Skin: Capillary refill takes less than 2 seconds. No rash noted. She is not diaphoretic. No erythema.  Psychiatric: She has a normal mood and affect.  Nursing note and vitals reviewed.    ED Treatments / Results  Labs (all labs ordered are listed, but only abnormal results are displayed) Labs Reviewed  LIPASE, BLOOD - Abnormal; Notable for the following components:      Result Value   Lipase 55 (*)    All other components within normal limits  COMPREHENSIVE METABOLIC PANEL - Abnormal; Notable for the following components:   Potassium 3.4 (*)    Creatinine, Ser 1.01 (*)    All other components within normal limits  CBC - Abnormal; Notable for the following components:   WBC 12.4 (*)    Hemoglobin 11.7 (*)    All other components within normal limits  URINE CULTURE  URINALYSIS, ROUTINE W REFLEX MICROSCOPIC  I-STAT BETA HCG BLOOD, ED ([facility], [facility], [facility] ONLY)    EKG None  Radiology Ct Abdomen Pelvis W Contrast  Result Date: 04/18/2017 CLINICAL DATA:  Acute right flank pain. EXAM: CT ABDOMEN AND PELVIS WITH  CONTRAST TECHNIQUE: Multidetector CT imaging of the abdomen and pelvis was performed using the standard protocol following bolus administration of intravenous contrast. CONTRAST:  13mL ISOVUE-300 IOPAMIDOL (ISOVUE-300) INJECTION 61% COMPARISON:  CT scan of June 26, 2016. FINDINGS: Lower chest: No acute abnormality. Hepatobiliary: No focal liver abnormality is seen. No gallstones, gallbladder wall thickening, or biliary dilatation. Pancreas: Unremarkable. No pancreatic ductal dilatation or surrounding inflammatory changes. Spleen: Normal in size without focal abnormality. Adrenals/Urinary Tract: Adrenal glands are unremarkable. Kidneys are normal, without renal calculi, focal lesion, or hydronephrosis. Bladder is unremarkable. Stomach/Bowel: Stomach is within normal limits. Appendix appears normal. No evidence of bowel wall thickening, distention, or inflammatory changes. Vascular/Lymphatic: No significant vascular findings are present. No enlarged abdominal or pelvic lymph nodes. Reproductive: Status post hysterectomy. No adnexal masses. Other: Moderate-sized fat containing periumbilical hernia is noted. Stable calcification is noted adjacent to proximal right ureter which most likely is venous in origin. Musculoskeletal: No acute or significant osseous findings. IMPRESSION: Moderate size fat containing periumbilical hernia. No other significant abnormality seen in the abdomen or pelvis. Electronically Signed   By: Marijo Conception, M.D.   On: 04/18/2017 19:16   US Abdomen Limited Ruq  Result Date: 04/19/2017 CLINICAL DATA:  Right upper quadrant pain. EXAM: ULTRASOUND ABDOMEN LIMITED RIGHT UPPER QUADRANT COMPARISON:  CT 04/18/2017 FINDINGS: Gallbladder: Sludge noted within the dependent portion of the gallbladder. No gallbladder wall thickening. Negative sonographic Murphy's sign. Common bile duct: Diameter: 5.9 mm. Liver: Increased parenchymal echogenicity compatible with hepatic steatosis. No focal abnormality.  Portal vein is patent on color Doppler imaging with normal direction of blood flow towards the liver. IMPRESSION: 1. No acute findings identified. 2. Hepatic steatosis 3. Gallbladder sludge. Electronically Signed   By: Kerby Moors M.D.   On: 04/19/2017 10:22  Procedures Procedures (including critical care time)  Medications Ordered in ED Medications  morphine 4 MG/ML injection 4 mg (4 mg Intravenous Given 04/19/17 0851)  sodium chloride 0.9 % bolus 500 mL (0 mLs Intravenous Stopped 04/19/17 1029)     Initial Impression / Assessment and Plan / ED Course  I have reviewed the triage vital signs and the nursing notes.  Pertinent labs & imaging results that were available during my care of the patient were reviewed by me and considered in my medical decision making (see chart for details).     [name] is a 57 y.o. female with a past medical history significant for CKD, COPD, asthma, hypertension, hyperlipidemia, CAD, CHF, and prior diverticulitis who presents with upper abdominal pain, lightheadedness, and diarrhea.  Patient reports that she has had symptoms for the last 5 days.  She reports that her abdominal pain is moderate to severe and is across her upper abdomen.  She reports that she was seen yesterday for this complaint and was reassured after reassuring imaging and labs.  She says that she went home and after trying to eat something had continued severe abdominal pain.  She reports no nausea or vomiting but has had no appetite.  She reports that she has had several episodes of diarrhea overnight and loose stools.  She denies any blood in her bowel movement.  The imaging yesterday showed no evidence of diverticulitis obstruction or abscess.  There is no comment of gallbladder pathology however patient is having right upper quadrant discomfort on arrival today.  Patient denies fevers, chills, urinary symptoms, constipation.  She denies any trauma.  She does report that she has been on  prednisone from her PCP for unclear reason.  She was also told that she may have a GI bug causing her diarrhea.  On exam, patient's lungs were clear.  No crackles in the bases.  Legs were not edematous.  Patient says that she is not fluid overloaded currently.  Patient's abdomen was tender in the upper abdomen facility the right upper quadrant.  Epigastrium also slightly tender.  No CVA tenderness present.  Chest nontender.  Patient is alert and oriented.  Patient has had a normal saturations on room air.  Based on patient's complaints I am still concerned about a gastritis causing her diarrhea and abdominal discomfort however due to the right upper quadrant location of the pain, the worsening with eating, and no history of gallbladder removal, will obtain right upper quadrant ultrasound to look for gallbladder pathology.  Patient will have lab testing.  Patient will be given some fluids to help with the likely dehydration and lightheadedness.  Patient was given pain medicine for the discomfort.    Anticipate discharge if patient's workup is reassuring.  Diagnostic work-up overall reassuring.  Patient is not pregnant.  Lipase is similar to prior and only slightly elevated.  Metabolic panel reassuring.  CBC shows mild leukocytosis improved from prior.  Urinalysis shows no UTI.  Ultrasound did not show evidence of acute cholecystitis.  Next  Suspect viral gastroenteritis as the cause of the patient's consolation symptoms.  Patient has nausea medicine at home and was feeling much better after fluids and nausea medicine.  Patient felt comfortable going home to continue outpatient hydration and follow-up with PCP.  Patient understood return precautions.  Patient had no other questions or concerns and was discharged in good condition.  Final Clinical Impressions(s) / ED Diagnoses   Final diagnoses:  RUQ abdominal pain  Nausea vomiting  and diarrhea    ED Discharge Orders    None      Clinical  Impression: 1. Nausea vomiting and diarrhea   2. RUQ abdominal pain     Disposition: Discharge  Condition: Good  I have discussed the results, Dx and Tx plan with the pt(& family if present). He/she/they expressed understanding and agree(s) with the plan. Discharge instructions discussed at great length. Strict return precautions discussed and pt &/or family have verbalized understanding of the instructions. No further questions at time of discharge.    Discharge Medication List as of 04/19/2017  4:29 PM      Follow Up: Bartholome Bill, MD [address] [place] [address] [facility] DEPT [place] [id number] [facility] [address] [place]       Tegeler, Gwenyth Allegra, MD 04/19/17 [id number]

## 2017-04-19 NOTE — ED Triage Notes (Signed)
Pt states that she woke up this morning with abd pains more on right side, felt like going to pass out and then had loose, eatery stools.  Pt denies vomiting or urinary problems. Reports she was here yesterday for same thing.

## 2017-04-19 NOTE — Discharge Instructions (Signed)
Your work-up today when combined with a work-up last night did not show evidence of acute pathology in your abdomen.  We did not see evidence of gallbladder trouble or diverticulitis.  We suspect you are having a viral gastroenteritis causing your constellation of symptoms.  We suspect you are dehydrated causing the lightheadedness.  After fluids you felt better.  Please continue your nausea medicine at home and follow-up with your primary doctor in several days.  If any symptoms change or worsen, please return to the nearest emergency department.

## 2017-04-20 LAB — URINE CULTURE

## 2017-04-22 ENCOUNTER — Ambulatory Visit: Payer: 59 | Admitting: Nurse Practitioner

## 2017-04-22 ENCOUNTER — Encounter (HOSPITAL_COMMUNITY): Payer: Self-pay | Admitting: *Deleted

## 2017-04-22 ENCOUNTER — Other Ambulatory Visit: Payer: Self-pay

## 2017-04-23 ENCOUNTER — Ambulatory Visit: Payer: 59 | Attending: Family Medicine | Admitting: Physical Therapy

## 2017-04-23 ENCOUNTER — Encounter: Payer: Self-pay | Admitting: Physical Therapy

## 2017-04-23 DIAGNOSIS — M5442 Lumbago with sciatica, left side: Secondary | ICD-10-CM | POA: Diagnosis not present

## 2017-04-23 DIAGNOSIS — M62830 Muscle spasm of back: Secondary | ICD-10-CM

## 2017-04-23 NOTE — Therapy (Signed)
[facility] [place], [place], [address] Phone: [phone]   Fax:  [phone]  Physical Therapy Evaluation  Patient Details  Name: [name] MRN: [id number] Date of Birth: Jun 13, 1960 Referring Provider: Precious Haws   Encounter Date: 04/23/2017  PT End of Session - 04/23/17 0921    Visit Number  1    Date for PT Re-Evaluation  06/23/17    PT Start Time  0855    PT Stop Time  0945    PT Time Calculation (min)  50 min    Activity Tolerance  Patient tolerated treatment well;Patient limited by pain    Behavior During Therapy  [facility] for tasks assessed/performed       Past Medical History:  Diagnosis Date  . Acute on chronic diastolic CHF (congestive heart failure) ([place]) 04/11/2014  . Acute suppurative otitis media without spontaneous rupture of eardrum 02/04/2007   Centricity Description: OTITIS MEDIA, SUPPURATIVE, ACUTE, BILATERAL Qualifier: Diagnosis of  By: [name] MD, [name]  Centricity Description: OTITIS MEDIA, SUPPURATIVE, ACUTE Qualifier: Diagnosis of  By: [name] MD, [name]   . Allergic rhinitis   . CAD (coronary artery disease)    LHC (11/29/1999):  EF 60%; no significant CAD.  [name] Carpal tunnel syndrome   . Carpal tunnel syndrome 06/09/2006   Qualifier: Diagnosis of  By: Marca Ancona RMA, Lucy    . Chronic diastolic CHF (congestive heart failure) ([place])    a. Echo (01/21/11): Vigorous LVF, EF 65-70%, no RWMA, Gr 2 DD. b.  Echo (12/15): Moderate LVH, EF 38-10%, grade 2 diastolic dysfunction, mild LAE, normal RV function c. 01/2015: echo showing EF of 60-65% with mild LVH  . Common migraine   . COPD (chronic obstructive pulmonary disease) ([place])   . Diastolic heart failure ([place])   . Diverticulitis   . Hepatic steatosis   . Hepatomegaly   . Hiatal hernia   . Hyperlipidemia   . Hypertension   . IBS (irritable bowel syndrome)   . Internal hemorrhoids   . Morbid obesity ([place])   . OSA (obstructive sleep  apnea)    CPAP dependent  . PNEUMONIA, ORGAN UNSPECIFIED 03/01/2009   Qualifier: Diagnosis of  By: [name] CMA, [name]    . Sinusitis, chronic 03/10/2012   CT sinuses 02/2012:  Chronic sinusitis with small A/F levels >> rx by ENT with prolonged augmentin F/u ct sinuses 04/2012:  Persistent sinus thickening >> rx with levaquin and clinda for 30 days.  Told to return for sinus scan in 6 weeks >> no showed for followup visit.  CT sinuses 11/2013:  Acute and chronic sinusitis noted.      Past Surgical History:  Procedure Laterality Date  . ABDOMINAL HYSTERECTOMY    . CESAREAN SECTION    . NASAL SINUS SURGERY    . PARTIAL HYSTERECTOMY    . TONSILLECTOMY    . TUBAL LIGATION    . UVULOPALATOPHARYNGOPLASTY      There were no vitals filed for this visit.   Subjective Assessment - 04/23/17 0854    Subjective  Patient reports that she started having left Hs and calf pain and now left low back pain when she was pulling and moving her bed in December, she tried mm relaxers and reports that did not help the pain.      Limitations  Sitting;Standing    How long can you sit comfortably?  10 min    How long can you stand comfortably?  10 min    Patient Stated Goals  have less pain    Currently in Pain?  Yes    Pain Score  5     Pain Location  Back    Pain Orientation  Left;Lower    Pain Descriptors / Indicators  Aching    Pain Type  Acute pain    Pain Radiating Towards  into the posterior left HS    Pain Onset  More than a month ago    Pain Frequency  Constant    Aggravating Factors   sitting, standing, pain up to 8/10    Pain Relieving Factors  Tramadol, ice, rest pain can be a 4/10    Effect of Pain on Daily Activities  limits ADL's         [facility] PT Assessment - 04/23/17 0001      Assessment   Medical Diagnosis  left low back pain and left leg pain    Referring Provider  [name] Boyd    Onset Date/Surgical Date  12/23/16    Prior Therapy  no      Precautions   Precautions  None       Balance Screen   Has the patient fallen in the past 6 months  No    Has the patient had a decrease in activity level because of a fear of falling?   No    Is the patient reluctant to leave their home because of a fear of falling?   No      Home Environment   Additional Comments  does housework      Prior Function   Level of Independence  Independent    Vocation  On disability    Vocation Requirements  CHF    Leisure  no exercise      Posture/Postural Control   Posture Comments  fwd head, rounded shoulders      ROM / Strength   AROM / PROM / Strength  AROM;Strength      AROM   Overall AROM Comments  lumbar ROM decreased 50% with pain in the left low back      Strength   Overall Strength Comments  4/5 with some pain in the left low back      Flexibility   Soft Tissue Assessment /Muscle Length  yes    Hamstrings  very tight    Quadriceps  very tight    ITB  very tight    Piriformis  very tight      Palpation   Palpation comment  she is very tight in the lumbar and buttocks, has some tenderness in the left lumbar and buttock area, very tender in the left HS area      Ambulation/Gait   Gait Comments  Patient no device, stiff in the hips                Objective measurements completed on examination: See above findings.      [facility] Adult PT Treatment/Exercise - 04/23/17 0001      Modalities   Modalities  Electrical Stimulation;Moist Heat      Moist Heat Therapy   Number Minutes Moist Heat  15 Minutes    Moist Heat Location  Lumbar Spine;Hip      Electrical Stimulation   Electrical Stimulation Location  Left buttock and into the HS    Electrical Stimulation Action  IFC    Electrical Stimulation Parameters  sitting    Electrical Stimulation Goals  Pain  PT Education - 04/23/17 [phone]    Education provided  Yes    Education Details  Gave TENS information, gave HS and piriformis stretches    Person(s) Educated  Patient    Methods   Explanation;Demonstration;Verbal cues;Handout    Comprehension  Verbalized understanding       PT Short Term Goals - 04/23/17 0925      PT SHORT TERM GOAL #1   Title  indepednent with initial HEP    Time  2    Period  Weeks    Status  New        PT Long Term Goals - 04/23/17 1151      PT LONG TERM GOAL #1   Title  report pain decreased 50%    Time  8    Period  Weeks    Status  New      PT LONG TERM GOAL #2   Title  understand correct posture and body mechanics    Time  8    Period  Weeks    Status  New      PT LONG TERM GOAL #3   Title  increase lumbar ROM 25%    Time  8    Period  Weeks    Status  New      PT LONG TERM GOAL #4   Title  report ability to sit 30 minutes without pain >4/10    Time  8    Period  Weeks    Status  New             Plan - 04/23/17 5400    Clinical Impression Statement  Patient reports that in December she was moving her bed and felt pain in the left low back and HS.  She reports that the pain has continued and not gotten any better, she has taken mm relaxers without any relief.  She has limited lumbar ROM, she is very tight in the LE's, she is tender in the buttock, low back and left HS.  She has very tight mms in these areas.    Clinical Presentation  Stable    Clinical Decision Making  Low    Rehab Potential  Good    PT Frequency  2x / week    PT Duration  8 weeks    PT Treatment/Interventions  ADLs/Self Care Home Management;Cryotherapy;Electrical Stimulation;Traction;Moist Heat;Therapeutic activities;Therapeutic exercise;Manual techniques;Patient/family education    PT Next Visit Plan  slowly start exercises for ROM, flexibility and core strength    Consulted and Agree with Plan of Care  Patient       Patient will benefit from skilled therapeutic intervention in order to improve the following deficits and impairments:  Cardiopulmonary status limiting activity, Decreased activity tolerance, Decreased mobility, Decreased  strength, Improper body mechanics, Impaired flexibility, Pain, Increased muscle spasms, Decreased range of motion, Difficulty walking  Visit Diagnosis: Acute left-sided low back pain with left-sided sciatica - Plan: PT plan of care cert/re-cert  Muscle spasm of back - Plan: PT plan of care cert/re-cert     Problem List Patient Active Problem List   Diagnosis Date Noted  . CKD (chronic kidney disease) stage 3, GFR 30-59 ml/min (HCC) 07/08/2016  . Cough variant asthma 07/31/2015  . Upper airway cough syndrome 04/11/2015  . Bronchitis, chronic obstructive w acute bronchitis ([place]) 04/11/2015  . Dyspnea   . Acute on chronic diastolic CHF (congestive heart failure), NYHA class 2 ([place]) 03/27/2015  . Morbid obesity due to excess  calories ([place]) 03/27/2015  . Dysphagia 03/27/2015  . Hyponatremia 03/27/2015  . Acute kidney injury ([place]) 03/27/2015  . Hypokalemia 03/27/2015  . Leukocytosis 03/27/2015  . COPD exacerbation ([place]) 03/27/2015  . Sinusitis, chronic 03/10/2012  . Hyperlipidemia 10/02/2006  . Obstructive sleep apnea 06/09/2006  . Essential hypertension 06/09/2006    Sumner Boast., PT 04/23/2017, 11:54 AM  [place], [place], [address] Phone: [phone]   Fax:  [phone]  Name: DALANA PFAHLER MRN: [id number] Date of Birth: Jan 12, 1961

## 2017-04-24 NOTE — Progress Notes (Deleted)
Cardiology Office Note   Date:  04/24/2017   ID:  [name], DOB July 16, 1960, MRN [id number]  PCP:  Bartholome Bill, MD  Cardiologist: Dr. Oval Linsey No chief complaint on file.    History of Present Illness: [name] is a 57 y.o. female who presents for ongoing assessment and management of chronic diastolic heart failure, hyperlipidemia, hypertension, history of OSA on BiPAP, COPD, and morbid obesity.  She had complained of palpitations and wore a 14-day event monitor revealed average heart rate of 87 bpm and was noted to have sinus tachycardia 120 bpm with PACs.  She was last seen by Dr. Oval Linsey on 02/27/2017.  On the last office visit Dr. Oval Linsey added carvedilol 12.5 mg twice daily, she was to continue hydralazine indoor metolazone spironolactone and torsemide.  The patient was scheduled for a Lexiscan Myoview and echocardiogram to evaluate for ischemia.    Past Medical History:  Diagnosis Date  . Acute on chronic diastolic CHF (congestive heart failure) ([place]) 04/11/2014  . Acute suppurative otitis media without spontaneous rupture of eardrum 02/04/2007   Centricity Description: OTITIS MEDIA, SUPPURATIVE, ACUTE, BILATERAL Qualifier: Diagnosis of  By: [name] MD, [name]  Centricity Description: OTITIS MEDIA, SUPPURATIVE, ACUTE Qualifier: Diagnosis of  By: [name] MD, [name]   . Allergic rhinitis   . CAD (coronary artery disease)    LHC (11/29/1999):  EF 60%; no significant CAD.  [name] Carpal tunnel syndrome   . Carpal tunnel syndrome 06/09/2006   Qualifier: Diagnosis of  By: Marca Ancona RMA, Lucy    . Chronic diastolic CHF (congestive heart failure) ([place])    a. Echo (01/21/11): Vigorous LVF, EF 65-70%, no RWMA, Gr 2 DD. b.  Echo (12/15): Moderate LVH, EF 63-01%, grade 2 diastolic dysfunction, mild LAE, normal RV function c. 01/2015: echo showing EF of 60-65% with mild LVH  . Common migraine   . COPD (chronic obstructive pulmonary disease) ([place])   . Diastolic heart failure ([place])   .  Diverticulitis   . Hepatic steatosis   . Hepatomegaly   . Hiatal hernia   . Hyperlipidemia   . Hypertension   . IBS (irritable bowel syndrome)   . Internal hemorrhoids   . Morbid obesity ([place])   . OSA (obstructive sleep apnea)    CPAP dependent  . PNEUMONIA, ORGAN UNSPECIFIED 03/01/2009   Qualifier: Diagnosis of  By: [name] CMA, [name]    . Sinusitis, chronic 03/10/2012   CT sinuses 02/2012:  Chronic sinusitis with small A/F levels >> rx by ENT with prolonged augmentin F/u ct sinuses 04/2012:  Persistent sinus thickening >> rx with levaquin and clinda for 30 days.  Told to return for sinus scan in 6 weeks >> no showed for followup visit.  CT sinuses 11/2013:  Acute and chronic sinusitis noted.      Past Surgical History:  Procedure Laterality Date  . ABDOMINAL HYSTERECTOMY    . CESAREAN SECTION    . NASAL SINUS SURGERY    . PARTIAL HYSTERECTOMY    . TONSILLECTOMY    . TUBAL LIGATION    . UVULOPALATOPHARYNGOPLASTY       Current Outpatient Medications  Medication Sig Dispense Refill  . albuterol (PROVENTIL) (2.5 MG/3ML) 0.083% nebulizer solution Take 3 mLs (2.5 mg total) by nebulization every 4 (four) hours as needed for wheezing or shortness of breath. 300 mL 2  . aspirin 81 MG EC tablet TAKE 1 TABLET (81 MG TOTAL) BY MOUTH DAILY. 90 tablet 2  . bacitracin-polymyxin b (POLYSPORIN)  ophthalmic ointment 1 application. TO AFFECTED EYE TWO TIMES DAILY  0  . benzonatate (TESSALON) 200 MG capsule TAKE 1 CAPSULE(200 MG) BY MOUTH THREE TIMES DAILY AS NEEDED FOR COUGH 20 capsule 0  . carvedilol (COREG) 12.5 MG tablet Take 12.5 mg by mouth 2 (two) times daily with a meal.    . chlorpheniramine (CHLOR-TRIMETON) 4 MG tablet Take 8 mg by mouth at bedtime. Take 2 tablets by mouth at bedtime.  May add 1 extra every 4 hours as needed for drainage, drippy nose, throat clearing    . Dextromethorphan-Guaifenesin (MUCINEX DM MAXIMUM STRENGTH) 60-1200 MG TB12 Take 1 tablet by mouth 2 (two) times daily  as needed (cough).     . Diclofenac Sodium 3 % GEL Apply 1 application topically 3 (three) times daily. Knee pain    . dicyclomine (BENTYL) 10 MG capsule TAKE 1 CAPSULE(10 MG) BY MOUTH FOUR TIMES DAILY BEFORE MEALS AND AT BEDTIME 90 capsule 0  . famotidine (PEPCID) 20 MG tablet TAKE 1 TABLET BY MOUTH EVERY NIGHT AT BEDTIME (Patient taking differently: Take 20 mg by mouth daily at 3 pm. ) 90 tablet 0  . fluticasone (FLONASE) 50 MCG/ACT nasal spray PLACE 2 SPRAYS INTO BOTH NOSTRILS DAILY. 16 g 2  . gabapentin (NEURONTIN) 100 MG capsule Take 1 capsule (100 mg total) by mouth 3 (three) times daily. One three times daily 90 capsule 2  . hydrALAZINE (APRESOLINE) 25 MG tablet Take 1 tablet (25 mg total) by mouth 3 (three) times daily. 90 tablet 3  . isosorbide dinitrate (ISORDIL) 20 MG tablet Take 20 mg by mouth 3 (three) times daily.   3  . lidocaine (LIDODERM) 5 % Place 1 patch onto the skin daily. Remove & Discard patch within 12 hours or as directed by MD (Patient taking differently: Place 1 patch onto the skin daily as needed (for pain). Remove & Discard patch within 12 hours or as directed by MD) 10 patch 0  . losartan (COZAAR) 100 MG tablet TAKE 1 TABLET (100 MG TOTAL) BY MOUTH DAILY. (Patient taking differently: TAKE 0.5 TABLET (50 MG TOTAL) BY MOUTH DAILY.) 90 tablet 0  . meclizine (ANTIVERT) 25 MG tablet Take 25 mg by mouth every 8 (eight) hours as needed for dizziness.    . meloxicam (MOBIC) 15 MG tablet Take 15 mg by mouth daily at 3 pm.   4  . metolazone (ZAROXOLYN) 2.5 MG tablet Take 2.5 mg by mouth daily as needed (FOR OVERNIGHT WEIGHT GAIN OF 3 lbs. or more).    . montelukast (SINGULAIR) 10 MG tablet Take 1 tablet (10 mg total) by mouth at bedtime. 30 tablet 5  . Multiple Vitamins-Minerals (EMERGEN-C IMMUNE PLUS PO) Take 1 packet by mouth daily.    . pantoprazole (PROTONIX) 40 MG tablet TAKE 1 TABLET BY MOUTH DAILY, TAKE 30 TO 60 MINUTES BEFORE FIRST MEAL OF THE DAY (Patient taking  differently: Take 40 mg by mouth 2 (two) times daily before a meal. TAKE 1 TABLET BY MOUTH DAILY, TAKE 30 TO 60 MINUTES BEFORE FIRST MEAL OF THE DAY) 90 tablet 1  . polyethylene glycol powder (GLYCOLAX/MIRALAX) powder Use 2 capfuls in the morning and 1 capful in the evening and as directed (Patient taking differently: Take 17-34 g by mouth 2 (two) times daily. Use 2 capfuls in the morning and 1 capful in the evening) 527 g 11  . potassium chloride SA (K-DUR,KLOR-CON) 20 MEQ tablet TAKE 3 TABLETS BY MOUTH THREE TIMES DAILY (Patient taking differently: TAKE  3 TABLETS (60 MEQ) BY MOUTH THREE TIMES DAILY) 270 tablet 2  . promethazine (PHENERGAN) 25 MG tablet Take 1 tablet (25 mg total) by mouth every 6 (six) hours as needed for nausea or vomiting. 10 tablet 0  . Respiratory Therapy Supplies (FLUTTER) DEVI 1 application by Does not apply route as directed. 1 each 0  . spironolactone (ALDACTONE) 25 MG tablet TAKE 0.5 TABLETS (12.5 MG TOTAL) BY MOUTH 2 (TWO) TIMES DAILY. 90 tablet 3  . topiramate (TOPAMAX) 25 MG tablet Take 25 mg by mouth 2 (two) times daily.    [name] torsemide (DEMADEX) 20 MG tablet Take 2 tablets (40 mg total) by mouth 2 (two) times daily. 120 tablet 6  . traMADol (ULTRAM) 50 MG tablet Take 1 tablet (50 mg total) by mouth every 6 (six) hours as needed for severe pain. (Patient taking differently: Take 100 mg by mouth every 6 (six) hours as needed for severe pain. ) 15 tablet 0   No current facility-administered medications for this visit.     Allergies:   Sulfonamide derivatives; Doxycycline; Latex; Ciprofloxacin; Fish allergy; Hydrocortisone; and Neomycin    Social History:  The patient  reports that she has never smoked. She has never used smokeless tobacco. She reports that she does not drink alcohol or use drugs.   Family History:  The patient's family history includes Asthma in her sister; Breast cancer in her unknown relative; Coronary artery disease in her unknown relative; Heart  attack in her mother; Heart disease in her mother; Hypertension in her mother.    ROS: All other systems are reviewed and negative. Unless otherwise mentioned in H&P    PHYSICAL EXAM: VS:  There were no vitals taken for this visit. , BMI There is no height or weight on file to calculate BMI. GEN: Well nourished, well developed, in no acute distress HEENT: normal Neck: no JVD, carotid bruits, or masses Cardiac: ***RRR; no murmurs, rubs, or gallops,no edema  Respiratory:  clear to auscultation bilaterally, normal work of breathing GI: soft, nontender, nondistended, + BS MS: no deformity or atrophy Skin: warm and dry, no rash Neuro:  Strength and sensation are intact Psych: euthymic mood, full affect   EKG:  EKG {ACTION; IS/IS OIZ:[id number]} ordered today. The ekg ordered today demonstrates ***   Recent Labs: 05/01/2016: TSH 1.05 03/21/2017: B Natriuretic Peptide 26.1 04/19/2017: ALT 19; BUN 19; Creatinine, Ser 1.01; Hemoglobin 11.7; Platelets 330; Potassium 3.4; Sodium 139    Lipid Panel    Component Value Date/Time   CHOL 169 02/20/2015 0440   TRIG 310 (H) 02/20/2015 0440   HDL 35 (L) 02/20/2015 0440   CHOLHDL 4.8 02/20/2015 0440   VLDL 62 (H) 02/20/2015 0440   LDLCALC 72 02/20/2015 0440      Wt Readings from Last 3 Encounters:  04/19/17 295 lb (133.8 kg)  04/03/17 295 lb (133.8 kg)  04/01/17 295 lb (133.8 kg)      Other studies Reviewed: NM Stress Test Study Highlights     Nuclear stress EF: 77%. No wall motion abnormality. Increased LV wall thickness.  The left ventricular ejection fraction is hyperdynamic (>65%).  There was no ST segment deviation noted during stress.  This is a low risk study. No ischemia identified.   Echocardiogram 03/03/2017 Left ventricle: The cavity size was normal. There was moderate   concentric hypertrophy. Systolic function was vigorous. The   estimated ejection fraction was in the range of 65% to 70%. Wall   motion was  normal;  there were no regional wall motion   abnormalities. Doppler parameters are consistent with abnormal   left ventricular relaxation (grade 1 diastolic dysfunction).   Doppler parameters are consistent with elevated ventricular   end-diastolic filling pressure. - Aortic valve: There was no regurgitation. - Mitral valve: There was no regurgitation. - Left atrium: The atrium was normal in size. - Right ventricle: Systolic function was normal. - Right atrium: The atrium was normal in size. - Tricuspid valve: There was no regurgitation. - Pulmonary arteries: Systolic pressure could not be accurately   estimated. - Inferior vena cava: The vessel was normal in size. - Pericardium, extracardiac: There was no pericardial effusion.  14-day event Monitor 11/06/16:  Quality: Fair. Baseline artifact. Predominant rhythm: Sinus rhythm Average heart rate: 87 bpm  Patient reported palpitations at which time sinus tachycardia 120 bpm was noted PACs also noted  Recent Labs: 04/12/2016: ALT 20; Magnesium 1.9 05/01/2016: TSH 1.05 06/26/2016: B Natriuretic Peptide 31.5 02/23/2017: BUN 14; Creatinine, Ser 1.06; Hemoglobin 11.0; Platelets 310; Potassium 4.0; Sodium 140     ASSESSMENT AND PLAN:  1.  ***   Current medicines are reviewed at length with the patient today.    Labs/ tests ordered today include: *** Phill Myron. [place] Pugh, ANP, AACC   04/24/2017 11:32 AM    [facility] [address], [place], [place] [address] Phone: [phone]; Fax: [phone]

## 2017-04-28 ENCOUNTER — Ambulatory Visit: Payer: 59 | Admitting: Adult Health

## 2017-04-29 ENCOUNTER — Other Ambulatory Visit: Payer: Self-pay

## 2017-04-29 ENCOUNTER — Ambulatory Visit (AMBULATORY_SURGERY_CENTER): Payer: Self-pay | Admitting: *Deleted

## 2017-04-29 VITALS — Ht 63.0 in | Wt 305.0 lb

## 2017-04-29 DIAGNOSIS — Z1211 Encounter for screening for malignant neoplasm of colon: Secondary | ICD-10-CM

## 2017-04-29 MED ORDER — NA SULFATE-K SULFATE-MG SULF 17.5-3.13-1.6 GM/177ML PO SOLN: 1.0000 [IU] | Freq: Once | ORAL | 0 refills | Status: AC

## 2017-04-29 NOTE — Progress Notes (Signed)
No egg or soy allergy known to patient  No issues with past sedation with any surgeries  or procedures, no intubation problems  No diet pills per patient No home 02 use per patient  No blood thinners per patient  Pt denies issues with constipation yes uses Miralax and stool softener No A fib or A flutter  EMMI video sent to pt's e mail

## 2017-04-30 ENCOUNTER — Encounter: Payer: Self-pay | Admitting: Physical Therapy

## 2017-04-30 ENCOUNTER — Ambulatory Visit: Payer: 59 | Admitting: Physical Therapy

## 2017-04-30 DIAGNOSIS — M5442 Lumbago with sciatica, left side: Secondary | ICD-10-CM

## 2017-04-30 DIAGNOSIS — M62830 Muscle spasm of back: Secondary | ICD-10-CM

## 2017-04-30 NOTE — Therapy (Signed)
[place], [place], [address] Phone: [phone]   Fax:  [phone]  Physical Therapy Treatment  Patient Details  Name: [name] MRN: [id number] Date of Birth: Dec 10, 1960 Referring Provider: Precious Haws   Encounter Date: 04/30/2017  PT End of Session - 04/30/17 1523    Visit Number  2    Date for PT Re-Evaluation  06/23/17    PT Start Time  1440    PT Stop Time  1540    PT Time Calculation (min)  60 min    Activity Tolerance  Patient tolerated treatment well;Patient limited by pain    Behavior During Therapy  [facility] for tasks assessed/performed       Past Medical History:  Diagnosis Date  . Acute on chronic diastolic CHF (congestive heart failure) ([place]) 04/11/2014  . Acute suppurative otitis media without spontaneous rupture of eardrum 02/04/2007   Centricity Description: OTITIS MEDIA, SUPPURATIVE, ACUTE, BILATERAL Qualifier: Diagnosis of  By: [name] MD, [name]  Centricity Description: OTITIS MEDIA, SUPPURATIVE, ACUTE Qualifier: Diagnosis of  By: [name] MD, [name]   . Allergic rhinitis   . Allergy   . Anemia   . Anxiety   . Arthritis   . CAD (coronary artery disease)    LHC (11/29/1999):  EF 60%; no significant CAD.  [name] Carpal tunnel syndrome   . Carpal tunnel syndrome 06/09/2006   Qualifier: Diagnosis of  By: Marca Ancona RMA, Lucy    . Chronic diastolic CHF (congestive heart failure) ([place])    a. Echo (01/21/11): Vigorous LVF, EF 65-70%, no RWMA, Gr 2 DD. b.  Echo (12/15): Moderate LVH, EF 25-95%, grade 2 diastolic dysfunction, mild LAE, normal RV function c. 01/2015: echo showing EF of 60-65% with mild LVH  . Common migraine   . COPD (chronic obstructive pulmonary disease) ([place])   . Diastolic heart failure ([place])   . Diverticulitis   . GERD (gastroesophageal reflux disease)   . Hepatic steatosis   . Hepatomegaly   . Hiatal hernia   . Hyperlipidemia   . Hypertension   . IBS (irritable  bowel syndrome)   . Internal hemorrhoids   . Morbid obesity ([place])   . OSA (obstructive sleep apnea)    CPAP dependent  . PNEUMONIA, ORGAN UNSPECIFIED 03/01/2009   Qualifier: Diagnosis of  By: [name] CMA, [name]    . Sinusitis, chronic 03/10/2012   CT sinuses 02/2012:  Chronic sinusitis with small A/F levels >> rx by ENT with prolonged augmentin F/u ct sinuses 04/2012:  Persistent sinus thickening >> rx with levaquin and clinda for 30 days.  Told to return for sinus scan in 6 weeks >> no showed for followup visit.  CT sinuses 11/2013:  Acute and chronic sinusitis noted.    . Sleep apnea    bi pap    Past Surgical History:  Procedure Laterality Date  . ABDOMINAL HYSTERECTOMY    . CESAREAN SECTION    . COLONOSCOPY    . NASAL SINUS SURGERY    . PARTIAL HYSTERECTOMY    . TONSILLECTOMY    . TUBAL LIGATION    . UVULOPALATOPHARYNGOPLASTY      There were no vitals filed for this visit.  Subjective Assessment - 04/30/17 1450    Subjective  Patient reports that she did okay with the stretches.  Reports that the back and the knees are hurting    Currently in Pain?  Yes    Pain Score  5     Pain Location  Back knees                       [facility] Adult PT Treatment/Exercise - 04/30/17 0001      Exercises   Exercises  Knee/Hip      Knee/Hip Exercises: Stretches   Passive Hamstring Stretch  3 reps;20 seconds    Gastroc Stretch  3 reps;10 seconds      Knee/Hip Exercises: Aerobic   Nustep  level 5 x 6 minutes      Knee/Hip Exercises: Machines for Strengthening   Cybex Knee Extension  5# 2x10    Cybex Knee Flexion  20# 2x10      Knee/Hip Exercises: Standing   Walking with Sports Cord  fwd only 4 x 30 feet      Knee/Hip Exercises: Seated   Ball Squeeze  20 reps    Clamshell with Marga Hoots      Knee/Hip Exercises: Supine   Other Supine Knee/Hip Exercises  feet on ball K2C, trunk rotation and small bridges      Moist Heat Therapy   Number Minutes Moist Heat   15 Minutes    Moist Heat Location  Lumbar Spine;Hip      Electrical Stimulation   Electrical Stimulation Location  Left buttock and into the HS    Electrical Stimulation Action  IFC    Electrical Stimulation Parameters  sitting    Electrical Stimulation Goals  Pain               PT Short Term Goals - 04/30/17 1527      PT SHORT TERM GOAL #1   Title  indepednent with initial HEP    Status  On-going        PT Long Term Goals - 04/23/17 1151      PT LONG TERM GOAL #1   Title  report pain decreased 50%    Time  8    Period  Weeks    Status  New      PT LONG TERM GOAL #2   Title  understand correct posture and body mechanics    Time  8    Period  Weeks    Status  New      PT LONG TERM GOAL #3   Title  increase lumbar ROM 25%    Time  8    Period  Weeks    Status  New      PT LONG TERM GOAL #4   Title  report ability to sit 30 minutes without pain >4/10    Time  8    Period  Weeks    Status  New            Plan - 04/30/17 1523    Clinical Impression Statement  Patient with some questions about the HEP.  She is resistant to some of the exercises but is really able to do them with some verbal and tactile cues.  Having pain in the knees, hips and the back with some of the exercises    PT Next Visit Plan  continue to add exercises as tolerated    Consulted and Agree with Plan of Care  Patient       Patient will benefit from skilled therapeutic intervention in order to improve the following deficits and impairments:  Cardiopulmonary status limiting activity, Decreased activity tolerance, Decreased mobility, Decreased strength, Improper body mechanics, Impaired flexibility, Pain,  Increased muscle spasms, Decreased range of motion, Difficulty walking  Visit Diagnosis: Acute left-sided low back pain with left-sided sciatica  Muscle spasm of back     Problem List Patient Active Problem List   Diagnosis Date Noted  . CKD (chronic kidney disease) stage  3, GFR 30-59 ml/min (HCC) 07/08/2016  . Cough variant asthma 07/31/2015  . Upper airway cough syndrome 04/11/2015  . Bronchitis, chronic obstructive w acute bronchitis ([place]) 04/11/2015  . Dyspnea   . Acute on chronic diastolic CHF (congestive heart failure), NYHA class 2 ([place]) 03/27/2015  . Morbid obesity due to excess calories ([place]) 03/27/2015  . Dysphagia 03/27/2015  . Hyponatremia 03/27/2015  . Acute kidney injury ([place]) 03/27/2015  . Hypokalemia 03/27/2015  . Leukocytosis 03/27/2015  . COPD exacerbation ([place]) 03/27/2015  . Sinusitis, chronic 03/10/2012  . Hyperlipidemia 10/02/2006  . Obstructive sleep apnea 06/09/2006  . Essential hypertension 06/09/2006    Sumner Boast., PT 04/30/2017, 3:28 PM  [place], [place], [address] Phone: [phone]   Fax:  [phone]  Name: PEONY BARNER MRN: [id number] Date of Birth: 1960-08-12

## 2017-05-01 ENCOUNTER — Telehealth: Payer: Self-pay | Admitting: Internal Medicine

## 2017-05-01 MED ORDER — ALBUTEROL SULFATE (2.5 MG/3ML) 0.083% IN NEBU: 2.5000 mg | INHALATION_SOLUTION | RESPIRATORY_TRACT | 2 refills | Status: DC | PRN

## 2017-05-01 NOTE — Telephone Encounter (Signed)
Reordered pt's albuterol neb for pt placing the dx code on Rx.  Called [facility] pharmacy and spoke with Ankit who stated they received the Rx I sent in for pt.    Called pt letting her know the rx had been received by pharmacy and they were in the process of running her insurance and hope to have the Rx ready soon for her.  Pt expressed understanding. Nothing further needed at this time.

## 2017-05-02 ENCOUNTER — Ambulatory Visit: Payer: 59 | Admitting: Physical Therapy

## 2017-05-02 ENCOUNTER — Other Ambulatory Visit: Payer: Self-pay | Admitting: Pulmonary Disease

## 2017-05-02 ENCOUNTER — Encounter: Payer: Self-pay | Admitting: Physical Therapy

## 2017-05-02 DIAGNOSIS — M5442 Lumbago with sciatica, left side: Secondary | ICD-10-CM

## 2017-05-02 DIAGNOSIS — M62830 Muscle spasm of back: Secondary | ICD-10-CM

## 2017-05-02 NOTE — Therapy (Signed)
[place], [place], [address] Phone: [phone]   Fax:  [phone]  Physical Therapy Treatment  Patient Details  Name: [name] MRN: [id number] Date of Birth: 07-07-1960 Referring Provider: Precious Haws   Encounter Date: 05/02/2017  PT End of Session - 05/02/17 1050    Visit Number  3    Date for PT Re-Evaluation  06/23/17    PT Start Time  1010    PT Stop Time  1108    PT Time Calculation (min)  58 min    Activity Tolerance  Patient tolerated treatment well;Patient limited by pain    Behavior During Therapy  [facility] for tasks assessed/performed       Past Medical History:  Diagnosis Date  . Acute on chronic diastolic CHF (congestive heart failure) ([place]) 04/11/2014  . Acute suppurative otitis media without spontaneous rupture of eardrum 02/04/2007   Centricity Description: OTITIS MEDIA, SUPPURATIVE, ACUTE, BILATERAL Qualifier: Diagnosis of  By: [name] MD, [name]  Centricity Description: OTITIS MEDIA, SUPPURATIVE, ACUTE Qualifier: Diagnosis of  By: [name] MD, [name]   . Allergic rhinitis   . Allergy   . Anemia   . Anxiety   . Arthritis   . CAD (coronary artery disease)    LHC (11/29/1999):  EF 60%; no significant CAD.  [name] Carpal tunnel syndrome   . Carpal tunnel syndrome 06/09/2006   Qualifier: Diagnosis of  By: Marca Ancona RMA, Lucy    . Chronic diastolic CHF (congestive heart failure) ([place])    a. Echo (01/21/11): Vigorous LVF, EF 65-70%, no RWMA, Gr 2 DD. b.  Echo (12/15): Moderate LVH, EF 64-40%, grade 2 diastolic dysfunction, mild LAE, normal RV function c. 01/2015: echo showing EF of 60-65% with mild LVH  . Common migraine   . COPD (chronic obstructive pulmonary disease) ([place])   . Diastolic heart failure ([place])   . Diverticulitis   . GERD (gastroesophageal reflux disease)   . Hepatic steatosis   . Hepatomegaly   . Hiatal hernia   . Hyperlipidemia   . Hypertension   . IBS (irritable  bowel syndrome)   . Internal hemorrhoids   . Morbid obesity ([place])   . OSA (obstructive sleep apnea)    CPAP dependent  . PNEUMONIA, ORGAN UNSPECIFIED 03/01/2009   Qualifier: Diagnosis of  By: [name] CMA, [name]    . Sinusitis, chronic 03/10/2012   CT sinuses 02/2012:  Chronic sinusitis with small A/F levels >> rx by ENT with prolonged augmentin F/u ct sinuses 04/2012:  Persistent sinus thickening >> rx with levaquin and clinda for 30 days.  Told to return for sinus scan in 6 weeks >> no showed for followup visit.  CT sinuses 11/2013:  Acute and chronic sinusitis noted.    . Sleep apnea    bi pap    Past Surgical History:  Procedure Laterality Date  . ABDOMINAL HYSTERECTOMY    . CESAREAN SECTION    . COLONOSCOPY    . NASAL SINUS SURGERY    . PARTIAL HYSTERECTOMY    . TONSILLECTOMY    . TUBAL LIGATION    . UVULOPALATOPHARYNGOPLASTY      There were no vitals filed for this visit.  Subjective Assessment - 05/02/17 1018    Subjective  Patient reports that she was really sore in the calves and the legs    Currently in Pain?  Yes    Pain Score  5     Pain  Location  Back    Aggravating Factors   exercises                       [facility] Adult PT Treatment/Exercise - 05/02/17 0001      Knee/Hip Exercises: Aerobic   Nustep  level 4 x 6 minutes    Other Aerobic  UBE level 2 x 4 minutes      Knee/Hip Exercises: Machines for Strengthening   Cybex Knee Extension  5# 2x10    Cybex Knee Flexion  20# 2x10      Knee/Hip Exercises: Seated   Ball Squeeze  20 reps    Clamshell with [employer]raBand  Green    Other Seated Knee/Hip Exercises  blue tband chest press, blue tband scapular stabilization      Knee/Hip Exercises: Supine   Other Supine Knee/Hip Exercises  feet on ball K2C, trunk rotation and small bridges      Moist Heat Therapy   Number Minutes Moist Heat  15 Minutes    Moist Heat Location  Lumbar Spine;Hip      Electrical Stimulation   Electrical Stimulation  Location  Left buttock and into the HS    Electrical Stimulation Action  IFC    Electrical Stimulation Parameters  sitting    Electrical Stimulation Goals  Pain               PT Short Term Goals - 04/30/17 1527      PT SHORT TERM GOAL #1   Title  indepednent with initial HEP    Status  On-going        PT Long Term Goals - 04/23/17 1151      PT LONG TERM GOAL #1   Title  report pain decreased 50%    Time  8    Period  Weeks    Status  New      PT LONG TERM GOAL #2   Title  understand correct posture and body mechanics    Time  8    Period  Weeks    Status  New      PT LONG TERM GOAL #3   Title  increase lumbar ROM 25%    Time  8    Period  Weeks    Status  New      PT LONG TERM GOAL #4   Title  report ability to sit 30 minutes without pain >4/10    Time  8    Period  Weeks    Status  New            Plan - 05/02/17 1050    Clinical Impression Statement  Patient gave good effort with exercises, c/o some pain but usually with cues can move throught the exercises and have less pain.  She is very weak and deconditioned, c/o mm soreness with any of the exercises    PT Next Visit Plan  continue to add exercises as tolerated    Consulted and Agree with Plan of Care  Patient       Patient will benefit from skilled therapeutic intervention in order to improve the following deficits and impairments:  Cardiopulmonary status limiting activity, Decreased activity tolerance, Decreased mobility, Decreased strength, Improper body mechanics, Impaired flexibility, Pain, Increased muscle spasms, Decreased range of motion, Difficulty walking  Visit Diagnosis: Acute left-sided low back pain with left-sided sciatica  Muscle spasm of back     Problem List Patient Active Problem List  Diagnosis Date Noted  . CKD (chronic kidney disease) stage 3, GFR 30-59 ml/min (HCC) 07/08/2016  . Cough variant asthma 07/31/2015  . Upper airway cough syndrome 04/11/2015  .  Bronchitis, chronic obstructive w acute bronchitis ([place]) 04/11/2015  . Dyspnea   . Acute on chronic diastolic CHF (congestive heart failure), NYHA class 2 ([place]) 03/27/2015  . Morbid obesity due to excess calories ([place]) 03/27/2015  . Dysphagia 03/27/2015  . Hyponatremia 03/27/2015  . Acute kidney injury ([place]) 03/27/2015  . Hypokalemia 03/27/2015  . Leukocytosis 03/27/2015  . COPD exacerbation ([place]) 03/27/2015  . Sinusitis, chronic 03/10/2012  . Hyperlipidemia 10/02/2006  . Obstructive sleep apnea 06/09/2006  . Essential hypertension 06/09/2006    Sumner Boast., PT 05/02/2017, 10:52 AM  [place], [place], [address] Phone: [phone]   Fax:  [phone]  Name: [name] MRN: [id number] Date of Birth: 06-06-1960

## 2017-05-05 ENCOUNTER — Ambulatory Visit (HOSPITAL_COMMUNITY): Payer: 59 | Admitting: Anesthesiology

## 2017-05-05 ENCOUNTER — Ambulatory Visit (HOSPITAL_COMMUNITY)
Admission: RE | Admit: 2017-05-05 | Discharge: 2017-05-05 | Disposition: A | Discharge status: Home / Self Care | Payer: 59 | Source: Ambulatory Visit | Attending: Gastroenterology | Admitting: Gastroenterology

## 2017-05-05 ENCOUNTER — Encounter (HOSPITAL_COMMUNITY): Payer: Self-pay

## 2017-05-05 ENCOUNTER — Encounter (HOSPITAL_COMMUNITY)
Admission: RE | Disposition: A | Discharge status: Home / Self Care | Payer: Self-pay | Source: Ambulatory Visit | Attending: Gastroenterology

## 2017-05-05 ENCOUNTER — Other Ambulatory Visit: Payer: Self-pay

## 2017-05-05 DIAGNOSIS — R109 Unspecified abdominal pain: Secondary | ICD-10-CM | POA: Diagnosis present

## 2017-05-05 DIAGNOSIS — K648 Other hemorrhoids: Secondary | ICD-10-CM | POA: Diagnosis not present

## 2017-05-05 DIAGNOSIS — Z79899 Other long term (current) drug therapy: Secondary | ICD-10-CM | POA: Insufficient documentation

## 2017-05-05 DIAGNOSIS — G8929 Other chronic pain: Secondary | ICD-10-CM | POA: Insufficient documentation

## 2017-05-05 DIAGNOSIS — D125 Benign neoplasm of sigmoid colon: Secondary | ICD-10-CM

## 2017-05-05 DIAGNOSIS — Z888 Allergy status to other drugs, medicaments and biological substances status: Secondary | ICD-10-CM | POA: Insufficient documentation

## 2017-05-05 DIAGNOSIS — Z880 Allergy status to penicillin: Secondary | ICD-10-CM | POA: Insufficient documentation

## 2017-05-05 DIAGNOSIS — K5900 Constipation, unspecified: Secondary | ICD-10-CM | POA: Insufficient documentation

## 2017-05-05 DIAGNOSIS — Z1211 Encounter for screening for malignant neoplasm of colon: Secondary | ICD-10-CM

## 2017-05-05 DIAGNOSIS — J449 Chronic obstructive pulmonary disease, unspecified: Secondary | ICD-10-CM | POA: Diagnosis not present

## 2017-05-05 DIAGNOSIS — K219 Gastro-esophageal reflux disease without esophagitis: Secondary | ICD-10-CM | POA: Diagnosis not present

## 2017-05-05 DIAGNOSIS — Z6841 Body Mass Index (BMI) 40.0 and over, adult: Secondary | ICD-10-CM | POA: Diagnosis not present

## 2017-05-05 DIAGNOSIS — Z825 Family history of asthma and other chronic lower respiratory diseases: Secondary | ICD-10-CM | POA: Insufficient documentation

## 2017-05-05 DIAGNOSIS — G4733 Obstructive sleep apnea (adult) (pediatric): Secondary | ICD-10-CM | POA: Diagnosis not present

## 2017-05-05 DIAGNOSIS — E785 Hyperlipidemia, unspecified: Secondary | ICD-10-CM | POA: Diagnosis not present

## 2017-05-05 DIAGNOSIS — I5033 Acute on chronic diastolic (congestive) heart failure: Secondary | ICD-10-CM | POA: Insufficient documentation

## 2017-05-05 DIAGNOSIS — Z8371 Family history of colonic polyps: Secondary | ICD-10-CM | POA: Diagnosis not present

## 2017-05-05 DIAGNOSIS — Z91040 Latex allergy status: Secondary | ICD-10-CM | POA: Insufficient documentation

## 2017-05-05 DIAGNOSIS — K5730 Diverticulosis of large intestine without perforation or abscess without bleeding: Secondary | ICD-10-CM | POA: Insufficient documentation

## 2017-05-05 DIAGNOSIS — F419 Anxiety disorder, unspecified: Secondary | ICD-10-CM | POA: Insufficient documentation

## 2017-05-05 DIAGNOSIS — K635 Polyp of colon: Secondary | ICD-10-CM

## 2017-05-05 DIAGNOSIS — Z8249 Family history of ischemic heart disease and other diseases of the circulatory system: Secondary | ICD-10-CM | POA: Insufficient documentation

## 2017-05-05 DIAGNOSIS — Z7951 Long term (current) use of inhaled steroids: Secondary | ICD-10-CM | POA: Diagnosis not present

## 2017-05-05 DIAGNOSIS — I110 Hypertensive heart disease with heart failure: Secondary | ICD-10-CM | POA: Insufficient documentation

## 2017-05-05 DIAGNOSIS — M1990 Unspecified osteoarthritis, unspecified site: Secondary | ICD-10-CM | POA: Insufficient documentation

## 2017-05-05 HISTORY — PX: COLONOSCOPY WITH PROPOFOL: SHX5780

## 2017-05-05 SURGERY — COLONOSCOPY WITH PROPOFOL
Anesthesia: Monitor Anesthesia Care

## 2017-05-05 MED ORDER — PROPOFOL 500 MG/50ML IV EMUL
INTRAVENOUS | Status: DC | PRN
  Administered 2017-05-05: 50 mg via INTRAVENOUS
  Administered 2017-05-05: 20 mg via INTRAVENOUS

## 2017-05-05 MED ORDER — PROPOFOL 10 MG/ML IV BOLUS
INTRAVENOUS | Status: AC
  Filled 2017-05-05: qty 20

## 2017-05-05 MED ORDER — SODIUM CHLORIDE 0.9 % IV SOLN: INTRAVENOUS | Status: DC

## 2017-05-05 MED ORDER — PROPOFOL 10 MG/ML IV BOLUS
INTRAVENOUS | Status: AC
  Filled 2017-05-05: qty 40

## 2017-05-05 MED ORDER — LACTATED RINGERS IV SOLN
INTRAVENOUS | Status: DC
Start: 2017-05-05 — End: 2017-05-05
  Administered 2017-05-05: 10:00:00 via INTRAVENOUS

## 2017-05-05 MED ORDER — ONDANSETRON HCL 4 MG/2ML IJ SOLN
INTRAMUSCULAR | Status: DC | PRN
  Administered 2017-05-05: 4 mg via INTRAVENOUS

## 2017-05-05 MED ORDER — PROPOFOL 500 MG/50ML IV EMUL
INTRAVENOUS | Status: DC | PRN
  Administered 2017-05-05: 150 ug/kg/min via INTRAVENOUS

## 2017-05-05 SURGICAL SUPPLY — 21 items

## 2017-05-05 NOTE — Discharge Instructions (Signed)

## 2017-05-05 NOTE — H&P (Signed)
[facility] History and Physical   Primary Care Physician:  Bartholome Bill, MD   Reason for Procedure:   Colorectal cancer screening Plan:    Colonoscopy with possible intervention    HPI: [name] is a 57 y.o. female with h/o IBS, R side abdominal pain, chronic constipation here for screening colonoscopy.   Past Medical History:  Diagnosis Date  . Acute on chronic diastolic CHF (congestive heart failure) ([place]) 04/11/2014  . Acute suppurative otitis media without spontaneous rupture of eardrum 02/04/2007   Centricity Description: OTITIS MEDIA, SUPPURATIVE, ACUTE, BILATERAL Qualifier: Diagnosis of  By: [name] MD, [name]  Centricity Description: OTITIS MEDIA, SUPPURATIVE, ACUTE Qualifier: Diagnosis of  By: [name] MD, [name]   . Allergic rhinitis   . Allergy   . Anemia   . Anxiety   . Arthritis   . CAD (coronary artery disease)    LHC (11/29/1999):  EF 60%; no significant CAD.  [name] Carpal tunnel syndrome   . Carpal tunnel syndrome 06/09/2006   Qualifier: Diagnosis of  By: Marca Ancona RMA, Lucy    . Chronic diastolic CHF (congestive heart failure) ([place])    a. Echo (01/21/11): Vigorous LVF, EF 65-70%, no RWMA, Gr 2 DD. b.  Echo (12/15): Moderate LVH, EF 08-65%, grade 2 diastolic dysfunction, mild LAE, normal RV function c. 01/2015: echo showing EF of 60-65% with mild LVH  . Common migraine   . COPD (chronic obstructive pulmonary disease) ([place])   . Diastolic heart failure ([place])   . Diverticulitis   . GERD (gastroesophageal reflux disease)   . Hepatic steatosis   . Hepatomegaly   . Hiatal hernia   . Hyperlipidemia   . Hypertension   . IBS (irritable bowel syndrome)   . Internal hemorrhoids   . Morbid obesity ([place])   . OSA (obstructive sleep apnea)    CPAP dependent  . PNEUMONIA, ORGAN UNSPECIFIED 03/01/2009   Qualifier: Diagnosis of  By: [name] CMA, [name]    . Sinusitis, chronic 03/10/2012   CT sinuses 02/2012:  Chronic sinusitis with small A/F levels >> rx  by ENT with prolonged augmentin F/u ct sinuses 04/2012:  Persistent sinus thickening >> rx with levaquin and clinda for 30 days.  Told to return for sinus scan in 6 weeks >> no showed for followup visit.  CT sinuses 11/2013:  Acute and chronic sinusitis noted.    . Sleep apnea    bi pap    Past Surgical History:  Procedure Laterality Date  . ABDOMINAL HYSTERECTOMY    . CESAREAN SECTION    . COLONOSCOPY    . NASAL SINUS SURGERY    . PARTIAL HYSTERECTOMY    . TONSILLECTOMY    . TUBAL LIGATION    . UVULOPALATOPHARYNGOPLASTY      Prior to Admission medications   Medication Sig Start Date End Date Taking? Authorizing Provider  albuterol (PROVENTIL) (2.5 MG/3ML) 0.083% nebulizer solution Take 3 mLs (2.5 mg total) by nebulization every 4 (four) hours as needed for wheezing or shortness of breath. 05/01/17  Yes Tanda Rockers, MD  aspirin 81 MG EC tablet TAKE 1 TABLET (81 MG TOTAL) BY MOUTH DAILY. 07/08/16  Yes Strader, [place] M, PA-C  benzonatate (TESSALON) 200 MG capsule TAKE 1 CAPSULE(200 MG) BY MOUTH THREE TIMES DAILY AS NEEDED FOR COUGH 04/02/17  Yes Chesley Mires, MD  carvedilol (COREG) 12.5 MG tablet Take 12.5 mg by mouth 2 (two) times daily with a meal.   Yes [provider]  chlorpheniramine (  CHLOR-TRIMETON) 4 MG tablet Take 8 mg by mouth at bedtime. Take 2 tablets by mouth at bedtime.  May add 1 extra every 4 hours as needed for drainage, drippy nose, throat clearing   Yes [provider]  Dextromethorphan-Guaifenesin (MUCINEX DM MAXIMUM STRENGTH) 60-1200 MG TB12 Take 1 tablet by mouth 2 (two) times daily as needed (cough).    Yes [provider]  Diclofenac Sodium 3 % GEL Apply 1 application topically 3 (three) times daily. Knee pain   Yes [provider]  dicyclomine (BENTYL) 10 MG capsule TAKE 1 CAPSULE(10 MG) BY MOUTH FOUR TIMES DAILY BEFORE MEALS AND AT BEDTIME 12/04/15  Yes Irene Shipper, MD  famotidine (PEPCID) 20 MG tablet TAKE 1 TABLET BY MOUTH  EVERY NIGHT AT BEDTIME Patient taking differently: Take 20 mg by mouth daily at 3 pm.  02/26/17  Yes Tanda Rockers, MD  fluticasone (FLONASE) 50 MCG/ACT nasal spray PLACE 2 SPRAYS INTO BOTH NOSTRILS DAILY. 03/05/17  Yes Chesley Mires, MD  gabapentin (NEURONTIN) 100 MG capsule Take 1 capsule (100 mg total) by mouth 3 (three) times daily. One three times daily 02/07/17  Yes Tanda Rockers, MD  hydrALAZINE (APRESOLINE) 25 MG tablet Take 1 tablet (25 mg total) by mouth 3 (three) times daily. 03/27/17 06/25/17 Yes Lendon Colonel, NP  isosorbide dinitrate (ISORDIL) 20 MG tablet Take 20 mg by mouth 3 (three) times daily.  04/01/17  Yes [provider]  lidocaine (LIDODERM) 5 % Place 1 patch onto the skin daily. Remove & Discard patch within 12 hours or as directed by MD Patient taking differently: Place 1 patch onto the skin daily as needed (for pain). Remove & Discard patch within 12 hours or as directed by MD 03/22/17  Yes Antonietta Breach, PA-C  loratadine-pseudoephedrine (CLARITIN-D 24-HOUR) 10-240 MG 24 hr tablet Take 1 tablet by mouth daily.   Yes [provider]  losartan (COZAAR) 100 MG tablet TAKE 1 TABLET (100 MG TOTAL) BY MOUTH DAILY. Patient taking differently: TAKE 0.5 TABLET (50 MG TOTAL) BY MOUTH DAILY. 04/11/17  Yes Minus Breeding, MD  meclizine (ANTIVERT) 25 MG tablet Take 25 mg by mouth every 8 (eight) hours as needed for dizziness.   Yes [provider]  meloxicam (MOBIC) 15 MG tablet Take 15 mg by mouth daily at 3 pm.  03/31/17  Yes [provider]  metolazone (ZAROXOLYN) 2.5 MG tablet Take 2.5 mg by mouth daily as needed (FOR OVERNIGHT WEIGHT GAIN OF 3 lbs. or more).   Yes [provider]  montelukast (SINGULAIR) 10 MG tablet Take 1 tablet (10 mg total) by mouth at bedtime. 10/15/16  Yes Juanito Doom, MD  Multiple Vitamins-Minerals (EMERGEN-C IMMUNE PLUS PO) Take 1 packet by mouth daily.   Yes [provider]  pantoprazole (PROTONIX) 40  MG tablet TAKE 1 TABLET BY MOUTH DAILY, TAKE 30 TO 60 MINUTES BEFORE FIRST MEAL OF THE DAY Patient taking differently: Take 40 mg by mouth 2 (two) times daily before a meal. TAKE 1 TABLET BY MOUTH DAILY, TAKE 30 TO 60 MINUTES BEFORE FIRST MEAL OF THE DAY 03/25/17  Yes Nandigam, Venia Minks, MD  polyethylene glycol powder (GLYCOLAX/MIRALAX) powder Use 2 capfuls in the morning and 1 capful in the evening and as directed Patient taking differently: Take 17-34 g by mouth 2 (two) times daily. Use 2 capfuls in the morning and 1 capful in the evening 03/24/17  Yes Nandigam, Venia Minks, MD  potassium chloride SA (K-DUR,KLOR-CON) 20 MEQ  tablet TAKE 3 TABLETS BY MOUTH THREE TIMES DAILY Patient taking differently: TAKE 3 TABLETS (60 MEQ) BY MOUTH THREE TIMES DAILY 03/03/17  Yes Minus Breeding, MD  promethazine (PHENERGAN) 25 MG tablet Take 1 tablet (25 mg total) by mouth every 6 (six) hours as needed for nausea or vomiting. 04/18/17  Yes Lawyer, Harrell Gave, PA-C  Respiratory Therapy Supplies (FLUTTER) DEVI 1 application by Does not apply route as directed. 12/27/16  Yes Tanda Rockers, MD  spironolactone (ALDACTONE) 25 MG tablet TAKE 0.5 TABLETS (12.5 MG TOTAL) BY MOUTH 2 (TWO) TIMES DAILY. 10/22/16  Yes Skeet Latch, MD  topiramate (TOPAMAX) 25 MG tablet Take 25 mg by mouth 2 (two) times daily.   Yes [provider]  torsemide (DEMADEX) 20 MG tablet Take 2 tablets (40 mg total) by mouth 2 (two) times daily. 03/14/17 04/23/18 Yes Skeet Latch, MD  traMADol (ULTRAM) 50 MG tablet Take 1 tablet (50 mg total) by mouth every 6 (six) hours as needed for severe pain. Patient taking differently: Take 100 mg by mouth every 6 (six) hours as needed for severe pain.  04/18/17  Yes Lawyer, Harrell Gave, PA-C    Current Facility-Administered Medications  Medication Dose Route Frequency Provider Last Rate Last Dose  . lactated ringers infusion   Intravenous Continuous Mauri Pole, MD        Allergies as of  03/25/2017 - Review Complete 03/21/2017  Allergen Reaction Noted  . Sulfonamide derivatives Anaphylaxis, Swelling, Rash, and Other (See Comments) 10/21/2006  . Doxycycline Nausea And Vomiting 01/05/2008  . Latex Hives 09/28/2011  . Ciprofloxacin Rash 10/21/2006  . Fish allergy Rash and Other (See Comments) 03/08/2012  . Hydrocortisone Rash   . Neomycin Rash 10/21/2006    Family History  Problem Relation Age of Onset  . Heart disease Mother   . Heart attack Mother   . Hypertension Mother   . Coronary artery disease Unknown        1st degree relatvie <50  . Breast cancer Unknown        aunt- ? paternal or maternal  . Asthma Sister   . Colon polyps Sister   . Stroke Neg Hx   . Colon cancer Neg Hx   . Esophageal cancer Neg Hx   . Rectal cancer Neg Hx   . Stomach cancer Neg Hx     Social History   Socioeconomic History  . Marital status: Widowed    Spouse name: Not on file  . Number of children: 2  . Years of education: Not on file  . Highest education level: Not on file  Occupational History  . Occupation: Retired  Scientific laboratory technician  . Financial resource strain: Not on file  . Food insecurity:    Worry: Not on file    Inability: Not on file  . Transportation needs:    Medical: Not on file    Non-medical: Not on file  Tobacco Use  . Smoking status: Never Smoker  . Smokeless tobacco: Never Used  Substance and Sexual Activity  . Alcohol use: No  . Drug use: No  . Sexual activity: Not on file  Lifestyle  . Physical activity:    Days per week: Not on file    Minutes per session: Not on file  . Stress: Not on file  Relationships  . Social connections:    Talks on phone: Not on file    Gets together: Not on file    Attends religious service: Not on file  Active member of club or organization: Not on file    Attends meetings of clubs or organizations: Not on file    Relationship status: Not on file  . Intimate partner violence:    Fear of current or ex partner:  Not on file    Emotionally abused: Not on file    Physically abused: Not on file    Forced sexual activity: Not on file  Other Topics Concern  . Not on file  Social History Narrative   Charity fundraiser, Oceanographer.   Widowed, lives with son.    Review of Systems:  All other review of systems negative except as mentioned in the HPI.  Physical Exam: Vital signs in last 24 hours: Temp:  [97.8 F (36.6 C)] 97.8 F (36.6 C) (04/15 0932) Pulse Rate:  [80] 80 (04/15 0932) Resp:  [13] 13 (04/15 0932) BP: (123)/(37) 123/37 (04/15 0932) SpO2:  [95 %] 95 % (04/15 0932) Weight:  [295 lb (133.8 kg)] 295 lb (133.8 kg) (04/15 0932)   General:   Alert,  Well-developed, well-nourished, pleasant and cooperative in NAD Lungs:  Clear throughout to auscultation.   Heart:  Regular rate and rhythm; no murmurs, clicks, rubs,  or gallops. Abdomen:  Soft, nontender and nondistended. Normal bowel sounds.   Neuro/Psych:  Alert and cooperative. Normal mood and affect. A and O x 3   K. Denzil Magnuson , MD [phone]

## 2017-05-05 NOTE — Anesthesia Preprocedure Evaluation (Addendum)
Anesthesia Evaluation  Patient identified by MRN, date of birth, ID band Patient awake    Reviewed: Allergy & Precautions, NPO status , Patient's Chart, lab work & pertinent test results  Airway Mallampati: III  TM Distance: >3 FB Neck ROM: Full    Dental  (+) Teeth Intact, Dental Advisory Given   Pulmonary    breath sounds clear to auscultation       Cardiovascular hypertension,  Rhythm:Regular Rate:Normal     Neuro/Psych    GI/Hepatic   Endo/Other    Renal/GU      Musculoskeletal   Abdominal (+) + obese,   Peds  Hematology   Anesthesia Other Findings   Reproductive/Obstetrics                             Anesthesia Physical Anesthesia Plan  ASA: III  Anesthesia Plan: MAC   Post-op Pain Management:    Induction:   PONV Risk Score and Plan: Ondansetron  Airway Management Planned: Simple Face Mask and Natural Airway  Additional Equipment:   Intra-op Plan:   Post-operative Plan:   Informed Consent: I have reviewed the patients History and Physical, chart, labs and discussed the procedure including the risks, benefits and alternatives for the proposed anesthesia with the patient or authorized representative who has indicated his/her understanding and acceptance.   Dental advisory given  Plan Discussed with: CRNA and Anesthesiologist  Anesthesia Plan Comments:         Anesthesia Quick Evaluation

## 2017-05-05 NOTE — Anesthesia Procedure Notes (Signed)
Date/Time: 05/05/2017 10:35 AM Performed by: Glory Buff, CRNA Oxygen Delivery Method: Simple face mask

## 2017-05-05 NOTE — Transfer of Care (Signed)
Immediate Anesthesia Transfer of Care Note  Patient: [name]  Procedure(s) Performed: COLONOSCOPY WITH PROPOFOL (N/A )  Patient Location: PACU  Anesthesia Type:MAC  Level of Consciousness: awake, alert  and oriented  Airway & Oxygen Therapy: Patient Spontanous Breathing and Patient connected to face mask oxygen  Post-op Assessment: Report given to RN and Post -op Vital signs reviewed and stable  Post vital signs: Reviewed and stable  Last Vitals:  Vitals Value Taken Time  BP 139/80 05/05/2017 11:08 AM  Temp    Pulse 79 05/05/2017 11:10 AM  Resp 20 05/05/2017 11:10 AM  SpO2 100 % 05/05/2017 11:10 AM  Vitals shown include unvalidated device data.  Last Pain:  Vitals:   05/05/17 1109  TempSrc: Oral  PainSc: 0-No pain         Complications: No apparent anesthesia complications

## 2017-05-05 NOTE — Anesthesia Postprocedure Evaluation (Signed)
Anesthesia Post Note  Patient: [name]  Procedure(s) Performed: COLONOSCOPY WITH PROPOFOL (N/A )     Patient location during evaluation: Endoscopy Anesthesia Type: MAC Level of consciousness: awake and alert Pain management: pain level controlled Vital Signs Assessment: post-procedure vital signs reviewed and stable Respiratory status: spontaneous breathing, nonlabored ventilation, respiratory function stable and patient connected to nasal cannula oxygen Cardiovascular status: stable and blood pressure returned to baseline Postop Assessment: no apparent nausea or vomiting Anesthetic complications: no    Last Vitals:  Vitals:   05/05/17 1130 05/05/17 1140  BP: 140/73 (!) 143/72  Pulse: 64 67  Resp: (!) 21 (!) 26  Temp:    SpO2: 95% 97%    Last Pain:  Vitals:   05/05/17 1140  TempSrc:   PainSc: 0-No pain                 Rehema Muffley COKER

## 2017-05-05 NOTE — Op Note (Signed)
[facility] Patient Name: [name] Procedure Date: 05/05/2017 MRN: [id number] Attending MD: Mauri Pole , MD Date of Birth: 05-13-1960 CSN: [id number] Age: 57 Admit Type: Inpatient Procedure:                Colonoscopy Indications:              Screening for colorectal malignant neoplasm Providers:                Mauri Pole, MD, Burtis Junes, RN, Alan Mulder, Technician Referring MD:              Medicines:                Monitored Anesthesia Care Complications:            No immediate complications. Estimated Blood Loss:     Estimated blood loss was minimal. Procedure:                Pre-Anesthesia Assessment:                           - Prior to the procedure, a History and Physical                            was performed, and patient medications and                            allergies were reviewed. The patient's tolerance of                            previous anesthesia was also reviewed. The risks                            and benefits of the procedure and the sedation                            options and risks were discussed with the patient.                            All questions were answered, and informed consent                            was obtained. Prior Anticoagulants: The patient has                            taken no previous anticoagulant or antiplatelet                            agents. ASA Grade Assessment: III - A patient with                            severe systemic disease. After reviewing the risks  and benefits, the patient was deemed in                            satisfactory condition to undergo the procedure.                           After obtaining informed consent, the colonoscope                            was passed under direct vision. Throughout the                            procedure, the patient's blood pressure, pulse, and      oxygen saturations were monitored continuously. The                            EC-[id number] ([id number]) scope was introduced through                            the anus and advanced to the the cecum, identified                            by appendiceal orifice and ileocecal valve. The                            colonoscopy was performed without difficulty. The                            patient tolerated the procedure well. The quality                            of the bowel preparation was adequate. The                            ileocecal valve, appendiceal orifice, and rectum                            were photographed. Scope In: 10:42:36 AM Scope Out: 11:03:11 AM Scope Withdrawal Time: 0 hours 17 minutes 23 seconds  Total Procedure Duration: 0 hours 20 minutes 35 seconds  Findings:      The perianal and digital rectal examinations were normal.      A 11 mm polyp was found in the sigmoid colon. The polyp was       pedunculated. The polyp was removed with a hot snare. Resection and       retrieval were complete.      A single small-mouthed diverticulum was found in the cecum.      Non-bleeding internal hemorrhoids were found during retroflexion. The       hemorrhoids were small. Impression:               - One 11 mm polyp in the sigmoid colon, removed                            with a hot snare. Resected and retrieved.                           -  Diverticulosis in the cecum.                           - Non-bleeding internal hemorrhoids. Moderate Sedation:      N/A- Per Anesthesia Care Recommendation:           - Patient has a contact number available for                            emergencies. The signs and symptoms of potential                            delayed complications were discussed with the                            patient. Return to normal activities tomorrow.                            Written discharge instructions were provided to the                             patient.                           - Resume previous diet.                           - Continue present medications.                           - Await pathology results.                           - Repeat colonoscopy in 3 years for surveillance                            based on pathology results. Procedure Code(s):        --- Professional ---                           [phone], Colonoscopy, flexible; with removal of                            tumor(s), polyp(s), or other lesion(s) by snare                            technique Diagnosis Code(s):        --- Professional ---                           Z12.11, Encounter for screening for malignant                            neoplasm of colon                           D12.5, Benign neoplasm of sigmoid colon  K64.8, Other hemorrhoids                           K57.30, Diverticulosis of large intestine without                            perforation or abscess without bleeding CPT copyright 2017 [employer]. All rights reserved. The codes documented in this report are preliminary and upon coder review may  be revised to meet current compliance requirements. Mauri Pole, MD 05/05/2017 11:07:33 AM This report has been signed electronically. Number of Addenda: 0

## 2017-05-07 ENCOUNTER — Ambulatory Visit: Payer: 59 | Admitting: Physical Therapy

## 2017-05-07 ENCOUNTER — Encounter: Payer: Self-pay | Admitting: Physical Therapy

## 2017-05-07 ENCOUNTER — Encounter: Payer: Self-pay | Admitting: Gastroenterology

## 2017-05-07 DIAGNOSIS — M5442 Lumbago with sciatica, left side: Secondary | ICD-10-CM | POA: Diagnosis not present

## 2017-05-07 DIAGNOSIS — M62830 Muscle spasm of back: Secondary | ICD-10-CM

## 2017-05-07 NOTE — Therapy (Signed)
[place], [place], [address] Phone: [phone]   Fax:  [phone]  Physical Therapy Treatment  Patient Details  Name: [name] MRN: [id number] Date of Birth: 1960/11/16 Referring Provider: Precious Haws   Encounter Date: 05/07/2017  PT End of Session - 05/07/17 1534    Visit Number  4    Date for PT Re-Evaluation  06/23/17    PT Start Time  1444    PT Stop Time  1530    PT Time Calculation (min)  46 min    Activity Tolerance  Patient tolerated treatment well    Behavior During Therapy  [facility] for tasks assessed/performed       Past Medical History:  Diagnosis Date  . Acute on chronic diastolic CHF (congestive heart failure) ([place]) 04/11/2014  . Acute suppurative otitis media without spontaneous rupture of eardrum 02/04/2007   Centricity Description: OTITIS MEDIA, SUPPURATIVE, ACUTE, BILATERAL Qualifier: Diagnosis of  By: [name] MD, [name]  Centricity Description: OTITIS MEDIA, SUPPURATIVE, ACUTE Qualifier: Diagnosis of  By: [name] MD, [name]   . Allergic rhinitis   . Allergy   . Anemia   . Anxiety   . Arthritis   . CAD (coronary artery disease)    LHC (11/29/1999):  EF 60%; no significant CAD.  [name] Carpal tunnel syndrome   . Carpal tunnel syndrome 06/09/2006   Qualifier: Diagnosis of  By: Marca Ancona RMA, Lucy    . Chronic diastolic CHF (congestive heart failure) ([place])    a. Echo (01/21/11): Vigorous LVF, EF 65-70%, no RWMA, Gr 2 DD. b.  Echo (12/15): Moderate LVH, EF 33-82%, grade 2 diastolic dysfunction, mild LAE, normal RV function c. 01/2015: echo showing EF of 60-65% with mild LVH  . Common migraine   . COPD (chronic obstructive pulmonary disease) ([place])   . Diastolic heart failure ([place])   . Diverticulitis   . GERD (gastroesophageal reflux disease)   . Hepatic steatosis   . Hepatomegaly   . Hiatal hernia   . Hyperlipidemia   . Hypertension   . IBS (irritable bowel syndrome)   .  Internal hemorrhoids   . Morbid obesity ([place])   . OSA (obstructive sleep apnea)    CPAP dependent  . PNEUMONIA, ORGAN UNSPECIFIED 03/01/2009   Qualifier: Diagnosis of  By: [name] CMA, [name]    . Sinusitis, chronic 03/10/2012   CT sinuses 02/2012:  Chronic sinusitis with small A/F levels >> rx by ENT with prolonged augmentin F/u ct sinuses 04/2012:  Persistent sinus thickening >> rx with levaquin and clinda for 30 days.  Told to return for sinus scan in 6 weeks >> no showed for followup visit.  CT sinuses 11/2013:  Acute and chronic sinusitis noted.    . Sleep apnea    bi pap    Past Surgical History:  Procedure Laterality Date  . ABDOMINAL HYSTERECTOMY    . CESAREAN SECTION    . COLONOSCOPY    . COLONOSCOPY WITH PROPOFOL N/A 05/05/2017   Procedure: COLONOSCOPY WITH PROPOFOL;  Surgeon: Mauri Pole, MD;  Location: [facility];  Service: Endoscopy;  Laterality: N/A;  . NASAL SINUS SURGERY    . PARTIAL HYSTERECTOMY    . TONSILLECTOMY    . TUBAL LIGATION    . UVULOPALATOPHARYNGOPLASTY      There were no vitals filed for this visit.  Subjective Assessment - 05/07/17 1451    Subjective  I was very tired and sore  Currently in Pain?  Yes    Pain Score  2     Pain Location  Back                       [facility] Adult PT Treatment/Exercise - 05/07/17 0001      Knee/Hip Exercises: Stretches   Gastroc Stretch  3 reps;20 seconds      Knee/Hip Exercises: Aerobic   Nustep  level 5 x 6 minutes    Other Aerobic  UBE level 2 x 5 minutes      Knee/Hip Exercises: Machines for Strengthening   Cybex Knee Extension  5# 2x10    Cybex Knee Flexion  20# 2x10    Other Machine  5# trunk rotation      Knee/Hip Exercises: Standing   Other Standing Knee Exercises  Red tbans scapular stabilization      Knee/Hip Exercises: Seated   Ball Squeeze  20 reps    Clamshell with Marga Hoots      Knee/Hip Exercises: Supine   Other Supine Knee/Hip Exercises  feet on ball K2C,  trunk rotation and small bridges    Other Supine Knee/Hip Exercises  3# punches               PT Short Term Goals - 05/07/17 1539      PT SHORT TERM GOAL #1   Title  indepednent with initial HEP    Status  Achieved        PT Long Term Goals - 04/23/17 1151      PT LONG TERM GOAL #1   Title  report pain decreased 50%    Time  8    Period  Weeks    Status  New      PT LONG TERM GOAL #2   Title  understand correct posture and body mechanics    Time  8    Period  Weeks    Status  New      PT LONG TERM GOAL #3   Title  increase lumbar ROM 25%    Time  8    Period  Weeks    Status  New      PT LONG TERM GOAL #4   Title  report ability to sit 30 minutes without pain >4/10    Time  8    Period  Weeks    Status  New            Plan - 05/07/17 1535    Clinical Impression Statement  Patient reported no pain after exercises, she did need a lot of cues to stay on taks as she was doing a lot of talking.  She is deconditioned and needing further PT to address her inability for QOL and function    PT Next Visit Plan  continue to add exercises as tolerated    Consulted and Agree with Plan of Care  Patient       Patient will benefit from skilled therapeutic intervention in order to improve the following deficits and impairments:  Cardiopulmonary status limiting activity, Decreased activity tolerance, Decreased mobility, Decreased strength, Improper body mechanics, Impaired flexibility, Pain, Increased muscle spasms, Decreased range of motion, Difficulty walking  Visit Diagnosis: Acute left-sided low back pain with left-sided sciatica  Muscle spasm of back     Problem List Patient Active Problem List   Diagnosis Date Noted  . Polyp of sigmoid colon   . CKD (chronic kidney disease) stage  3, GFR 30-59 ml/min (HCC) 07/08/2016  . Cough variant asthma 07/31/2015  . Upper airway cough syndrome 04/11/2015  . Bronchitis, chronic obstructive w acute bronchitis ([place])  04/11/2015  . Dyspnea   . Acute on chronic diastolic CHF (congestive heart failure), NYHA class 2 ([place]) 03/27/2015  . Morbid obesity due to excess calories ([place]) 03/27/2015  . Dysphagia 03/27/2015  . Hyponatremia 03/27/2015  . Acute kidney injury ([place]) 03/27/2015  . Hypokalemia 03/27/2015  . Leukocytosis 03/27/2015  . COPD exacerbation ([place]) 03/27/2015  . Sinusitis, chronic 03/10/2012  . Hyperlipidemia 10/02/2006  . Obstructive sleep apnea 06/09/2006  . Essential hypertension 06/09/2006    Sumner Boast., PT 05/07/2017, 3:40 PM  [place], [place], [address] Phone: [phone]   Fax:  [phone]  Name: LYNASIA MELOCHE MRN: [id number] Date of Birth: 31-Jul-1960

## 2017-05-14 ENCOUNTER — Encounter: Payer: Self-pay | Admitting: Physical Therapy

## 2017-05-14 ENCOUNTER — Ambulatory Visit: Payer: 59 | Admitting: Physical Therapy

## 2017-05-14 DIAGNOSIS — M5442 Lumbago with sciatica, left side: Secondary | ICD-10-CM

## 2017-05-14 DIAGNOSIS — M62830 Muscle spasm of back: Secondary | ICD-10-CM

## 2017-05-14 NOTE — Therapy (Signed)
[place], [place], [address] Phone: [phone]   Fax:  [phone]  Physical Therapy Treatment  Patient Details  Name: [name] MRN: [id number] Date of Birth: 08-14-1960 Referring Provider: Precious Haws   Encounter Date: 05/14/2017  PT End of Session - 05/14/17 1053    Visit Number  5    Date for PT Re-Evaluation  06/23/17    PT Start Time  1010    PT Stop Time  1107    PT Time Calculation (min)  57 min    Activity Tolerance  Patient tolerated treatment well    Behavior During Therapy  [facility] for tasks assessed/performed       Past Medical History:  Diagnosis Date  . Acute on chronic diastolic CHF (congestive heart failure) ([place]) 04/11/2014  . Acute suppurative otitis media without spontaneous rupture of eardrum 02/04/2007   Centricity Description: OTITIS MEDIA, SUPPURATIVE, ACUTE, BILATERAL Qualifier: Diagnosis of  By: [name] MD, [name]  Centricity Description: OTITIS MEDIA, SUPPURATIVE, ACUTE Qualifier: Diagnosis of  By: [name] MD, [name]   . Allergic rhinitis   . Allergy   . Anemia   . Anxiety   . Arthritis   . CAD (coronary artery disease)    LHC (11/29/1999):  EF 60%; no significant CAD.  [name] Carpal tunnel syndrome   . Carpal tunnel syndrome 06/09/2006   Qualifier: Diagnosis of  By: Marca Ancona RMA, Lucy    . Chronic diastolic CHF (congestive heart failure) ([place])    a. Echo (01/21/11): Vigorous LVF, EF 65-70%, no RWMA, Gr 2 DD. b.  Echo (12/15): Moderate LVH, EF 14-97%, grade 2 diastolic dysfunction, mild LAE, normal RV function c. 01/2015: echo showing EF of 60-65% with mild LVH  . Common migraine   . COPD (chronic obstructive pulmonary disease) ([place])   . Diastolic heart failure ([place])   . Diverticulitis   . GERD (gastroesophageal reflux disease)   . Hepatic steatosis   . Hepatomegaly   . Hiatal hernia   . Hyperlipidemia   . Hypertension   . IBS (irritable bowel syndrome)   .  Internal hemorrhoids   . Morbid obesity ([place])   . OSA (obstructive sleep apnea)    CPAP dependent  . PNEUMONIA, ORGAN UNSPECIFIED 03/01/2009   Qualifier: Diagnosis of  By: [name] CMA, [name]    . Sinusitis, chronic 03/10/2012   CT sinuses 02/2012:  Chronic sinusitis with small A/F levels >> rx by ENT with prolonged augmentin F/u ct sinuses 04/2012:  Persistent sinus thickening >> rx with levaquin and clinda for 30 days.  Told to return for sinus scan in 6 weeks >> no showed for followup visit.  CT sinuses 11/2013:  Acute and chronic sinusitis noted.    . Sleep apnea    bi pap    Past Surgical History:  Procedure Laterality Date  . ABDOMINAL HYSTERECTOMY    . CESAREAN SECTION    . COLONOSCOPY    . COLONOSCOPY WITH PROPOFOL N/A 05/05/2017   Procedure: COLONOSCOPY WITH PROPOFOL;  Surgeon: Mauri Pole, MD;  [facility];  Service: Endoscopy;  Laterality: N/A;  . NASAL SINUS SURGERY    . PARTIAL HYSTERECTOMY    . TONSILLECTOMY    . TUBAL LIGATION    . UVULOPALATOPHARYNGOPLASTY      There were no vitals filed for this visit.  Subjective Assessment - 05/14/17 1014    Subjective  Reports that she feels like PT is  helping, reports that the hamstring is still hurting most days    Currently in Pain?  Yes    Pain Score  6     Pain Location  Back    Pain Orientation  Lower    Pain Descriptors / Indicators  Aching;Sore    Aggravating Factors   sitting                       [facility] Adult PT Treatment/Exercise - 05/14/17 0001      Knee/Hip Exercises: Aerobic   Nustep  level 5 x 6 minutes    Other Aerobic  UBE level 2 x 5 minutes      Knee/Hip Exercises: Machines for Strengthening   Cybex Knee Extension  5# 2x10    Cybex Knee Flexion  20# 2x10    Cybex Leg Press  20# 2x10    Other Machine  5# trunk rotation      Knee/Hip Exercises: Seated   Ball Squeeze  20 reps    Clamshell with TheraBand  Green    Other Seated Knee/Hip Exercises  blue tband chest  press, blue tband scapular stabilization    Other Seated Knee/Hip Exercises  red tband ankle exercises 2x10 each    Marching  Both;2 sets;10 reps      Moist Heat Therapy   Number Minutes Moist Heat  15 Minutes    Moist Heat Location  Lumbar Spine;Hip      Electrical Stimulation   Electrical Stimulation Location  Left buttock and into the HS    Electrical Stimulation Action  sitting    Electrical Stimulation Parameters  pa               PT Short Term Goals - 05/07/17 1539      PT SHORT TERM GOAL #1   Title  indepednent with initial HEP    Status  Achieved        PT Long Term Goals - 05/14/17 1055      PT LONG TERM GOAL #1   Title  report pain decreased 50%    Status  On-going      PT LONG TERM GOAL #3   Title  increase lumbar ROM 25%    Status  On-going            Plan - 05/14/17 1053    Clinical Impression Statement  Patient was in more pain today reports that she did a lot of standing and cooking over the weekend.  She needs a lot of encouragement and some cues to do the exercises and to do them correctly.  I feel that a good goal for her will be to walk around the outside of our building 1-2 laps, as she is planning a big trip in November, and I really do not think she could make it at her current level of function    PT Next Visit Plan  continue to add exercises as tolerated    Consulted and Agree with Plan of Care  Patient       Patient will benefit from skilled therapeutic intervention in order to improve the following deficits and impairments:  Cardiopulmonary status limiting activity, Decreased activity tolerance, Decreased mobility, Decreased strength, Improper body mechanics, Impaired flexibility, Pain, Increased muscle spasms, Decreased range of motion, Difficulty walking  Visit Diagnosis: Acute left-sided low back pain with left-sided sciatica  Muscle spasm of back     Problem List Patient Active Problem List   Diagnosis  Date Noted  .  Polyp of sigmoid colon   . CKD (chronic kidney disease) stage 3, GFR 30-59 ml/min (HCC) 07/08/2016  . Cough variant asthma 07/31/2015  . Upper airway cough syndrome 04/11/2015  . Bronchitis, chronic obstructive w acute bronchitis ([place]) 04/11/2015  . Dyspnea   . Acute on chronic diastolic CHF (congestive heart failure), NYHA class 2 ([place]) 03/27/2015  . Morbid obesity due to excess calories ([place]) 03/27/2015  . Dysphagia 03/27/2015  . Hyponatremia 03/27/2015  . Acute kidney injury ([place]) 03/27/2015  . Hypokalemia 03/27/2015  . Leukocytosis 03/27/2015  . COPD exacerbation ([place]) 03/27/2015  . Sinusitis, chronic 03/10/2012  . Hyperlipidemia 10/02/2006  . Obstructive sleep apnea 06/09/2006  . Essential hypertension 06/09/2006    Sumner Boast., PT 05/14/2017, 10:55 AM  [place], [place], [address] Phone: [phone]   Fax:  [phone]  Name: LEONARD HENDLER MRN: [id number] Date of Birth: 24-Apr-1960

## 2017-05-15 ENCOUNTER — Encounter: Payer: Self-pay | Admitting: Physical Therapy

## 2017-05-15 ENCOUNTER — Ambulatory Visit: Payer: 59 | Admitting: Physical Therapy

## 2017-05-15 DIAGNOSIS — M5442 Lumbago with sciatica, left side: Secondary | ICD-10-CM | POA: Diagnosis not present

## 2017-05-15 DIAGNOSIS — M62830 Muscle spasm of back: Secondary | ICD-10-CM

## 2017-05-15 NOTE — Therapy (Signed)
[place], [place], [address] Phone: [phone]   Fax:  [phone]  Physical Therapy Treatment  Patient Details  Name: [name] MRN: [id number] Date of Birth: May 03, 1960 Referring Provider: Precious Haws   Encounter Date: 05/15/2017  PT End of Session - 05/15/17 1039    Visit Number  6    Date for PT Re-Evaluation  06/23/17    PT Start Time  1014    PT Stop Time  1114    PT Time Calculation (min)  60 min    Activity Tolerance  Patient tolerated treatment well    Behavior During Therapy  [facility] for tasks assessed/performed       Past Medical History:  Diagnosis Date  . Acute on chronic diastolic CHF (congestive heart failure) ([place]) 04/11/2014  . Acute suppurative otitis media without spontaneous rupture of eardrum 02/04/2007   Centricity Description: OTITIS MEDIA, SUPPURATIVE, ACUTE, BILATERAL Qualifier: Diagnosis of  By: [name] MD, [name]  Centricity Description: OTITIS MEDIA, SUPPURATIVE, ACUTE Qualifier: Diagnosis of  By: [name] MD, [name]   . Allergic rhinitis   . Allergy   . Anemia   . Anxiety   . Arthritis   . CAD (coronary artery disease)    LHC (11/29/1999):  EF 60%; no significant CAD.  [name] Carpal tunnel syndrome   . Carpal tunnel syndrome 06/09/2006   Qualifier: Diagnosis of  By: Marca Ancona RMA, Lucy    . Chronic diastolic CHF (congestive heart failure) ([place])    a. Echo (01/21/11): Vigorous LVF, EF 65-70%, no RWMA, Gr 2 DD. b.  Echo (12/15): Moderate LVH, EF 66-06%, grade 2 diastolic dysfunction, mild LAE, normal RV function c. 01/2015: echo showing EF of 60-65% with mild LVH  . Common migraine   . COPD (chronic obstructive pulmonary disease) ([place])   . Diastolic heart failure ([place])   . Diverticulitis   . GERD (gastroesophageal reflux disease)   . Hepatic steatosis   . Hepatomegaly   . Hiatal hernia   . Hyperlipidemia   . Hypertension   . IBS (irritable bowel syndrome)   .  Internal hemorrhoids   . Morbid obesity ([place])   . OSA (obstructive sleep apnea)    CPAP dependent  . PNEUMONIA, ORGAN UNSPECIFIED 03/01/2009   Qualifier: Diagnosis of  By: [name] CMA, [name]    . Sinusitis, chronic 03/10/2012   CT sinuses 02/2012:  Chronic sinusitis with small A/F levels >> rx by ENT with prolonged augmentin F/u ct sinuses 04/2012:  Persistent sinus thickening >> rx with levaquin and clinda for 30 days.  Told to return for sinus scan in 6 weeks >> no showed for followup visit.  CT sinuses 11/2013:  Acute and chronic sinusitis noted.    . Sleep apnea    bi pap    Past Surgical History:  Procedure Laterality Date  . ABDOMINAL HYSTERECTOMY    . CESAREAN SECTION    . COLONOSCOPY    . COLONOSCOPY WITH PROPOFOL N/A 05/05/2017   Procedure: COLONOSCOPY WITH PROPOFOL;  Surgeon: Mauri Pole, MD;  [facility];  Service: Endoscopy;  Laterality: N/A;  . NASAL SINUS SURGERY    . PARTIAL HYSTERECTOMY    . TONSILLECTOMY    . TUBAL LIGATION    . UVULOPALATOPHARYNGOPLASTY      There were no vitals filed for this visit.  Subjective Assessment - 05/15/17 1019    Subjective  My legs are sore  Currently in Pain?  Yes    Pain Score  5     Pain Location  Back    Pain Orientation  Lower                       [facility] Adult PT Treatment/Exercise - 05/15/17 0001      Knee/Hip Exercises: Aerobic   Nustep  level 5 x 6 minutes    Other Aerobic  UBE level 2 x 5 minutes      Knee/Hip Exercises: Machines for Strengthening   Cybex Knee Flexion  20# 2x10    Cybex Leg Press  20# 2x10    Other Machine  10# rows, 15# lats, 5# chest press all 2x10      Knee/Hip Exercises: Seated   Other Seated Knee/Hip Exercises  black tband crunch, then ball in lap push downs for abs      Moist Heat Therapy   Number Minutes Moist Heat  15 Minutes    Moist Heat Location  Lumbar Spine;Hip      Electrical Stimulation   Electrical Stimulation Location  Left buttock and  into the HS    Electrical Stimulation Action  IFC    Electrical Stimulation Parameters  sitting    Electrical Stimulation Goals  Pain               PT Short Term Goals - 05/07/17 1539      PT SHORT TERM GOAL #1   Title  indepednent with initial HEP    Status  Achieved        PT Long Term Goals - 05/15/17 1051      PT LONG TERM GOAL #1   Title  report pain decreased 50%    Status  Partially Met      PT LONG TERM GOAL #3   Title  increase lumbar ROM 25%    Status  Partially Met      PT LONG TERM GOAL #4   Title  report ability to sit 30 minutes without pain >4/10    Status  On-going            Plan - 05/15/17 1040    Clinical Impression Statement  Patient resistant to doing some longer walking, she did give good effort with all exercises, cues needed for proper form on most and to get to end range.  She is very weak in the core, c/o pain in calves with leg curls and thighs with extension    PT Next Visit Plan  continue to add exercises as tolerated    Consulted and Agree with Plan of Care  Patient       Patient will benefit from skilled therapeutic intervention in order to improve the following deficits and impairments:  Cardiopulmonary status limiting activity, Decreased activity tolerance, Decreased mobility, Decreased strength, Improper body mechanics, Impaired flexibility, Pain, Increased muscle spasms, Decreased range of motion, Difficulty walking  Visit Diagnosis: Acute left-sided low back pain with left-sided sciatica  Muscle spasm of back     Problem List Patient Active Problem List   Diagnosis Date Noted  . Polyp of sigmoid colon   . CKD (chronic kidney disease) stage 3, GFR 30-59 ml/min (HCC) 07/08/2016  . Cough variant asthma 07/31/2015  . Upper airway cough syndrome 04/11/2015  . Bronchitis, chronic obstructive w acute bronchitis ([place]) 04/11/2015  . Dyspnea   . Acute on chronic diastolic CHF (congestive heart failure), NYHA class 2  ([place]) 03/27/2015  .  Morbid obesity due to excess calories ([place]) 03/27/2015  . Dysphagia 03/27/2015  . Hyponatremia 03/27/2015  . Acute kidney injury ([place]) 03/27/2015  . Hypokalemia 03/27/2015  . Leukocytosis 03/27/2015  . COPD exacerbation ([place]) 03/27/2015  . Sinusitis, chronic 03/10/2012  . Hyperlipidemia 10/02/2006  . Obstructive sleep apnea 06/09/2006  . Essential hypertension 06/09/2006    Sumner Boast., PT 05/15/2017, 10:51 AM  [place], [place], [address] Phone: [phone]   Fax:  [phone]  Name: [name] MRN: [id number] Date of Birth: 21-Mar-1960

## 2017-05-16 ENCOUNTER — Telehealth: Payer: Self-pay | Admitting: Cardiovascular Disease

## 2017-05-16 MED ORDER — LOSARTAN POTASSIUM 100 MG PO TABS: ORAL_TABLET | ORAL | 1 refills | Status: DC

## 2017-05-16 NOTE — Telephone Encounter (Signed)
New Message:      *STAT* If patient is at the pharmacy, call can be transferred to refill team.   1. Which medications need to be refilled? (please list name of each medication and dose if known) losartan (COZAAR) 100 MG tablet  2. Which pharmacy/location (including street and city if local pharmacy) is medication to be sent to?[id number] - [place], [place] [address]  3. Do they need a 30 day or 90 day supply? 30    Pt states that she takes 50 mg of this medication.

## 2017-05-19 ENCOUNTER — Ambulatory Visit: Payer: 59 | Admitting: Adult Health

## 2017-05-19 ENCOUNTER — Other Ambulatory Visit: Payer: Self-pay | Admitting: Cardiovascular Disease

## 2017-05-19 MED ORDER — LOSARTAN POTASSIUM 50 MG PO TABS: ORAL_TABLET | ORAL | 9 refills | Status: DC

## 2017-05-19 NOTE — Telephone Encounter (Signed)
°*  STAT* If patient is at the pharmacy, call can be transferred to refill team.    1. Which medications need to be refilled? (please list name of each medication and dose if known) pt needs a new prescription for Losartan 50 mg,she said the pharmacist said the 100 mg could no longer be scored  2. Which pharmacy/location (including street and city if local pharmacy) is medication to be sent to?[facility] [phone]  3. Do they need a 30 day or 90 day supply? 60 and refills

## 2017-05-19 NOTE — Telephone Encounter (Signed)
Rx(s) sent to pharmacy electronically.  

## 2017-05-20 ENCOUNTER — Ambulatory Visit: Payer: 59 | Admitting: Physical Therapy

## 2017-05-21 ENCOUNTER — Ambulatory Visit: Payer: Medicare Other | Admitting: Physical Therapy

## 2017-05-22 ENCOUNTER — Ambulatory Visit: Payer: Medicare Other | Admitting: Physical Therapy

## 2017-05-22 ENCOUNTER — Ambulatory Visit: Payer: 59 | Admitting: Internal Medicine

## 2017-05-23 ENCOUNTER — Ambulatory Visit (INDEPENDENT_AMBULATORY_CARE_PROVIDER_SITE_OTHER): Payer: 59 | Admitting: Internal Medicine

## 2017-05-23 ENCOUNTER — Encounter: Payer: Self-pay | Admitting: Internal Medicine

## 2017-05-23 ENCOUNTER — Other Ambulatory Visit: Payer: Self-pay | Admitting: Internal Medicine

## 2017-05-23 VITALS — BP 110/70 | HR 61 | Temp 97.8°F | Ht 63.0 in | Wt 295.0 lb

## 2017-05-23 DIAGNOSIS — R05 Cough: Secondary | ICD-10-CM | POA: Diagnosis not present

## 2017-05-23 DIAGNOSIS — J45991 Cough variant asthma: Secondary | ICD-10-CM | POA: Diagnosis not present

## 2017-05-23 DIAGNOSIS — J329 Chronic sinusitis, unspecified: Secondary | ICD-10-CM | POA: Diagnosis not present

## 2017-05-23 DIAGNOSIS — R058 Other specified cough: Secondary | ICD-10-CM

## 2017-05-23 MED ORDER — AMOXICILLIN-POT CLAVULANATE 875-125 MG PO TABS: 1.0000 | ORAL_TABLET | Freq: Two times a day (BID) | ORAL | 0 refills | Status: AC

## 2017-05-23 MED ORDER — AMOXICILLIN-POT CLAVULANATE 875-125 MG PO TABS: 1.0000 | ORAL_TABLET | Freq: Two times a day (BID) | ORAL | 0 refills | Status: DC
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
## 2017-05-23 NOTE — Patient Instructions (Addendum)
Augmentin 875 mg take one pill twice daily  X 10 days - take at breakfast and supper with large glass of water.  It would help reduce the usual side effects (diarrhea and yeast infections) if you ate cultured yogurt at lunch.   I will contact Dr [name] to sort out who will be following you going forward but it does not appear that you have a lung problem and may need to seek care else where if we can't do accurate medication reconciliation (making sure you have what you need and take it correctly).  If you want to see me in the future and it's not an emergency, first see [name] NP   with all your medications, even over the counter meds, separated in two separate bags, the ones you take no matter what vs the ones you stop once you feel better and take only as needed when you feel you need them.   [name]  will generate for you a new user friendly medication calendar that will put [place] all on the same page re: your medication use.    Without this process, it simply isn't possible to assure that we are providing  your outpatient care  with  the attention to detail we feel you deserve.   If we cannot assure that you're getting that kind of care,  then we cannot manage your problem effectively from this clinic.  Once you have seen [name] and we are sure that we're all on the same page with your medication use she will arrange follow up with me.

## 2017-05-25 ENCOUNTER — Encounter: Payer: Self-pay | Admitting: Internal Medicine

## 2017-05-25 NOTE — Assessment & Plan Note (Addendum)
NO  06/01/2015   =  8 - Spirometry 06/01/2015  No obstruction/ poor effort/ reproducibility  Allergy profile 06/01/2015 >  Eos 0.4 /  IgE  35 neg RAST - trial of singulair 06/01/2015 >>> - Sinus CT 06/07/2015 > No definite sinusitis. Only a minimal amount of debris is noted within the right partition of the sphenoid sinus. - Re CT sinus 05/24/2016 > neg > try gabapentin 100 tid (did not purchase)  - added flutter valve 12/27/2016  - 02/07/2017 rechallenge with gabapentin > could not verify 05/23/2017 that she was taking it    Acute flare in setting of reported purulent rhinitis/ ? Sinusitis > rx augmentin and f/u if needed   I had an extended final summary discussion with the patient reviewing all relevant studies completed to date and  lasting 20 minutes of a 25 minute acute office  visit on the following issues:   1) no evidence that she has chronic asthma though certainly at risk as on moderately high doses of coreg.  2) could have sinus infection now so rx with augmentin first step   3)  Med calendar contingencies all reviewed with her line by line   4) if not effective first step is med reconciliation:   To keep things simple, I have asked the patient to first separate medicines that are perceived as maintenance, that is to be taken daily "no matter what", from those medicines that are taken on only on an as-needed basis and I have given the patient examples of both, and then return to see our NP to generate a  detailed  medication calendar which should be followed until the next physician sees the patient and updates it.    She is not interested in #4 but shows very little insight in medication reconciliation/ accurancy of outpt rx and without this step, given she's on 27 meds at present, there is little I can offer her here in terms of assuring quality medical care and I informed her of this for now the 3rd time.    Since she does not have a pulmonary problem but sees Dr [name] for sleep, I  referred her back to him for f/u and will see only in emergency settings unless she's willing to meet me half way as above.

## 2017-05-25 NOTE — Assessment & Plan Note (Addendum)
07/31/2015  After extensive coaching HFA effectiveness =    25% > try symbicort 80 2bid and add spacer  - pt off symbicort 05/21/2016 x one month s any discernible airflow obst on spirometry > leave off   Still no evidence of chronic asthma, strongly suspect all UACS - see sep ap

## 2017-05-25 NOTE — Assessment & Plan Note (Signed)
CT sinuses 02/2012:  Chronic sinusitis with small A/F levels >> rx by ENT with prolonged augmentin F/u ct sinuses 04/2012:  Persistent sinus thickening >> rx with levaquin and clinda for 30 days.  Told to return for sinus scan in 6 weeks >> no showed for followup visit.  CT sinuses 11/2013:  Acute and chronic sinusitis noted. - repeat CT sinus indicated for refractory hs cough  05/23/16 > Mild mucosal edema in the right maxillary sinus and right sphenoid sinus. Remaining sinuses clear  - 05/23/2017 possible recurrent sinusitis > rx augmentin x 10 days

## 2017-05-25 NOTE — Assessment & Plan Note (Signed)
Body mass index is 52.26 kg/m.  -  trending no change  Lab Results  Component Value Date   TSH 1.05 05/01/2016     Contributing to gerd risk/ doe/reviewed the need and the process to achieve and maintain neg calorie balance > defer f/u primary care including intermittently monitoring thyroid status

## 2017-05-27 ENCOUNTER — Telehealth: Payer: Self-pay | Admitting: Pulmonary Disease

## 2017-05-27 NOTE — Telephone Encounter (Signed)
Will hold until fax is received.

## 2017-05-28 ENCOUNTER — Ambulatory Visit: Payer: Medicare Other | Admitting: Physical Therapy

## 2017-05-28 NOTE — Telephone Encounter (Signed)
Kelli have you seen this? Please advise.

## 2017-05-29 ENCOUNTER — Ambulatory Visit: Payer: Medicare Other | Admitting: Physical Therapy

## 2017-05-29 NOTE — Telephone Encounter (Signed)
This is not in VS look at folder not cubby.  Rodena Piety is unavailable at the current moment.   Rodena Piety, please advise if you have received anything on this pt. Thanks.

## 2017-05-29 NOTE — Telephone Encounter (Signed)
I have just checked Dr. Juanetta Gosling CMN folder and I do not have anything for this patient.

## 2017-05-30 ENCOUNTER — Other Ambulatory Visit: Payer: Self-pay | Admitting: Pulmonary Disease

## 2017-05-30 NOTE — Telephone Encounter (Signed)
I now have this form to be signed

## 2017-05-31 ENCOUNTER — Other Ambulatory Visit: Payer: Self-pay | Admitting: Cardiovascular Disease

## 2017-06-03 ENCOUNTER — Ambulatory Visit: Payer: Medicare Other | Admitting: Physical Therapy

## 2017-06-03 NOTE — Telephone Encounter (Signed)
Form will be signed by dr [name] when he arrives back in the office [place]

## 2017-06-03 NOTE — Telephone Encounter (Signed)
Rodena Piety can we close this message?

## 2017-06-04 ENCOUNTER — Ambulatory Visit: Payer: Medicare Other | Admitting: Physical Therapy

## 2017-06-04 ENCOUNTER — Other Ambulatory Visit: Payer: Self-pay | Admitting: Internal Medicine

## 2017-06-05 NOTE — Telephone Encounter (Signed)
Based on the last AVS I am not sure how many refills I should give her on her gababpentin Please advise thanks  Augmentin 875 mg take one pill twice daily  X 10 days - take at breakfast and supper with large glass of water.  It would help reduce the usual side effects (diarrhea and yeast infections) if you ate cultured yogurt at lunch.    I will contact Dr [name] to sort out who will be following you going forward but it does not appear that you have a lung problem and may need to seek care else where if we can't do accurate medication reconciliation (making sure you have what you need and take it correctly).   If you want to see me in the future and it's not an emergency, first see [name] NP   with all your medications, even over the counter meds, separated in two separate bags, the ones you take no matter what vs the ones you stop once you feel better and take only as needed when you feel you need them.   [name]  will generate for you a new user friendly medication calendar that will put [place] all on the same page re: your medication use.     Without this process, it simply isn't possible to assure that we are providing  your outpatient care  with  the attention to detail we feel you deserve.   If we cannot assure that you're getting that kind of care,  then we cannot manage your problem effectively from this clinic.   Once you have seen [name] and we are sure that we're all on the same page with your medication use she will arrange follow up with me.

## 2017-06-06 ENCOUNTER — Other Ambulatory Visit: Payer: Self-pay | Admitting: Student

## 2017-06-10 ENCOUNTER — Encounter: Payer: 59 | Admitting: Adult Health

## 2017-06-12 ENCOUNTER — Other Ambulatory Visit: Payer: Self-pay | Admitting: Internal Medicine

## 2017-06-13 ENCOUNTER — Ambulatory Visit: Payer: Medicare Other | Admitting: Physical Therapy

## 2017-06-16 NOTE — Progress Notes (Deleted)
Cardiology Office Note   Date:  06/16/2017   ID:  [name], DOB 1960/02/09, MRN [id number]  PCP:  Bartholome Bill, MD  Cardiologist: Dr.Patterson  No chief complaint on file.    History of Present Illness: [name] is a 57 y.o. female who presents for ongoing assessment and management of hypertension, history of hyperlipidemia, chronic diastolic heart failure, with other history to include OSA on BiPAP, morbid obesity, and COPD.  The patient also was diagnosed by Dr. Oval Linsey with inappropriate sinus tachycardia during hospitalization in early 2019.  The patient also had a Lexiscan Myoview and echocardiogram to evaluate for ischemia and LV function.  Carlton Adam was not completed, however echocardiogram was completed on 03/03/2017 which revealed vigorous systolic function with an estimated ejection fraction of 65% to 70%.  Grade 1 diastolic dysfunction, with no regional wall motion abnormalities.  The patient was last seen in the office in March 2019 and she was found to have blood pressure recordings from home of 120/64.  There were times when her blood pressure systolically dropped below 100 and she did have associated dizziness.  As a result of this her hydralazine was decreased to 25 mg 3 times daily from 37.5 mg 3 times a day.  She was to continue to keep a blood pressure record.  She is here for follow-up to evaluate her response to medications.  At the time she had also lost approximately 25 pounds and is planning on losing much more which will definitely impact her blood pressure and medication dosage.  Past Medical History:  Diagnosis Date  . Acute on chronic diastolic CHF (congestive heart failure) ([place]) 04/11/2014  . Acute suppurative otitis media without spontaneous rupture of eardrum 02/04/2007   Centricity Description: OTITIS MEDIA, SUPPURATIVE, ACUTE, BILATERAL Qualifier: Diagnosis of  By: [name] MD, [name]  Centricity Description: OTITIS MEDIA, SUPPURATIVE, ACUTE  Qualifier: Diagnosis of  By: [name] MD, [name]   . Allergic rhinitis   . Allergy   . Anemia   . Anxiety   . Arthritis   . CAD (coronary artery disease)    LHC (11/29/1999):  EF 60%; no significant CAD.  [name] Carpal tunnel syndrome   . Carpal tunnel syndrome 06/09/2006   Qualifier: Diagnosis of  By: Marca Ancona RMA, Lucy    . Chronic diastolic CHF (congestive heart failure) ([place])    a. Echo (01/21/11): Vigorous LVF, EF 65-70%, no RWMA, Gr 2 DD. b.  Echo (12/15): Moderate LVH, EF 12-45%, grade 2 diastolic dysfunction, mild LAE, normal RV function c. 01/2015: echo showing EF of 60-65% with mild LVH  . Common migraine   . COPD (chronic obstructive pulmonary disease) ([place])   . Diastolic heart failure ([place])   . Diverticulitis   . GERD (gastroesophageal reflux disease)   . Hepatic steatosis   . Hepatomegaly   . Hiatal hernia   . Hyperlipidemia   . Hypertension   . IBS (irritable bowel syndrome)   . Internal hemorrhoids   . Morbid obesity ([place])   . OSA (obstructive sleep apnea)    CPAP dependent  . PNEUMONIA, ORGAN UNSPECIFIED 03/01/2009   Qualifier: Diagnosis of  By: [name] CMA, [name]    . Sinusitis, chronic 03/10/2012   CT sinuses 02/2012:  Chronic sinusitis with small A/F levels >> rx by ENT with prolonged augmentin F/u ct sinuses 04/2012:  Persistent sinus thickening >> rx with levaquin and clinda for 30 days.  Told to return for sinus scan in 6 weeks >> no  showed for followup visit.  CT sinuses 11/2013:  Acute and chronic sinusitis noted.    . Sleep apnea    bi pap    Past Surgical History:  Procedure Laterality Date  . ABDOMINAL HYSTERECTOMY    . CESAREAN SECTION    . COLONOSCOPY    . COLONOSCOPY WITH PROPOFOL N/A 05/05/2017   Procedure: COLONOSCOPY WITH PROPOFOL;  Surgeon: Mauri Pole, MD;  Location: [facility];  Service: Endoscopy;  Laterality: N/A;  . NASAL SINUS SURGERY    . PARTIAL HYSTERECTOMY    . TONSILLECTOMY    . TUBAL LIGATION    . UVULOPALATOPHARYNGOPLASTY        Current Outpatient Medications  Medication Sig Dispense Refill  . albuterol (PROVENTIL) (2.5 MG/3ML) 0.083% nebulizer solution Take 3 mLs (2.5 mg total) by nebulization every 4 (four) hours as needed for wheezing or shortness of breath. 300 mL 2  . aspirin 81 MG EC tablet TAKE 1 TABLET BY MOUTH EVERY DAY 90 tablet 2  . benzonatate (TESSALON) 200 MG capsule TAKE 1 CAPSULE(200 MG) BY MOUTH THREE TIMES DAILY AS NEEDED FOR COUGH 20 capsule 0  . carvedilol (COREG) 12.5 MG tablet Take 12.5 mg by mouth 2 (two) times daily with a meal.    . chlorpheniramine (CHLOR-TRIMETON) 4 MG tablet Take 8 mg by mouth at bedtime. Take 2 tablets by mouth at bedtime.  May add 1 extra every 4 hours as needed for drainage, drippy nose, throat clearing    . Dextromethorphan-Guaifenesin (MUCINEX DM MAXIMUM STRENGTH) 60-1200 MG TB12 Take 1 tablet by mouth 2 (two) times daily as needed (cough).     . Diclofenac Sodium 3 % GEL Apply 1 application topically 3 (three) times daily. Knee pain    . dicyclomine (BENTYL) 10 MG capsule TAKE 1 CAPSULE(10 MG) BY MOUTH FOUR TIMES DAILY BEFORE MEALS AND AT BEDTIME 90 capsule 0  . famotidine (PEPCID) 20 MG tablet Take 1 tablet (20 mg total) by mouth daily at 3 pm. 90 tablet 1  . fluticasone (FLONASE) 50 MCG/ACT nasal spray SPRAY 2 SPRAYS INTO EACH NOSTRIL EVERY DAY 16 g 0  . gabapentin (NEURONTIN) 100 MG capsule TAKE ONE CAPSULE BY MOUTH THREE TIMES DAILY 90 capsule 0  . hydrALAZINE (APRESOLINE) 25 MG tablet Take 1 tablet (25 mg total) by mouth 3 (three) times daily. 90 tablet 3  . isosorbide dinitrate (ISORDIL) 20 MG tablet Take 20 mg by mouth 3 (three) times daily.   3  . lidocaine (LIDODERM) 5 % Place 1 patch onto the skin daily. Remove & Discard patch within 12 hours or as directed by MD (Patient taking differently: Place 1 patch onto the skin daily as needed (for pain). Remove & Discard patch within 12 hours or as directed by MD) 10 patch 0  . loratadine-pseudoephedrine  (CLARITIN-D 24-HOUR) 10-240 MG 24 hr tablet Take 1 tablet by mouth daily.    [name] losartan (COZAAR) 50 MG tablet TAKE 0.5 TABLET (50 MG TOTAL) BY MOUTH DAILY. 30 tablet 9  . meclizine (ANTIVERT) 25 MG tablet Take 25 mg by mouth every 8 (eight) hours as needed for dizziness.    . meloxicam (MOBIC) 15 MG tablet Take 15 mg by mouth daily at 3 pm.   4  . metolazone (ZAROXOLYN) 2.5 MG tablet Take 2.5 mg by mouth daily as needed (FOR OVERNIGHT WEIGHT GAIN OF 3 lbs. or more).    . montelukast (SINGULAIR) 10 MG tablet Take 1 tablet (10 mg total) by mouth at bedtime.  30 tablet 5  . Multiple Vitamins-Minerals (EMERGEN-C IMMUNE PLUS PO) Take 1 packet by mouth daily.    . pantoprazole (PROTONIX) 40 MG tablet TAKE 1 TABLET BY MOUTH DAILY, TAKE 30 TO 60 MINUTES BEFORE FIRST MEAL OF THE DAY (Patient taking differently: Take 40 mg by mouth 2 (two) times daily before a meal. TAKE 1 TABLET BY MOUTH DAILY, TAKE 30 TO 60 MINUTES BEFORE FIRST MEAL OF THE DAY) 90 tablet 1  . polyethylene glycol powder (GLYCOLAX/MIRALAX) powder Use 2 capfuls in the morning and 1 capful in the evening and as directed (Patient taking differently: Take 17-34 g by mouth 2 (two) times daily. Use 2 capfuls in the morning and 1 capful in the evening) 527 g 11  . potassium chloride SA (K-DUR,KLOR-CON) 20 MEQ tablet TAKE 3 TABLETS BY MOUTH THREE TIMES DAILY (Patient taking differently: TAKE 3 TABLETS (60 MEQ) BY MOUTH THREE TIMES DAILY) 270 tablet 2  . promethazine (PHENERGAN) 25 MG tablet Take 1 tablet (25 mg total) by mouth every 6 (six) hours as needed for nausea or vomiting. 10 tablet 0  . Respiratory Therapy Supplies (FLUTTER) DEVI 1 application by Does not apply route as directed. 1 each 0  . spironolactone (ALDACTONE) 25 MG tablet TAKE 0.5 TABLETS (12.5 MG TOTAL) BY MOUTH 2 (TWO) TIMES DAILY. 90 tablet 3  . topiramate (TOPAMAX) 25 MG tablet Take 25 mg by mouth 2 (two) times daily.    [name] torsemide (DEMADEX) 20 MG tablet TAKE 2 TABLETS(40 MG) BY  MOUTH TWICE DAILY 120 tablet 3  . traMADol (ULTRAM) 50 MG tablet Take 1 tablet (50 mg total) by mouth every 6 (six) hours as needed for severe pain. (Patient taking differently: Take 100 mg by mouth every 6 (six) hours as needed for severe pain. ) 15 tablet 0  . UNABLE TO FIND Med Name: CPAP with sleep     No current facility-administered medications for this visit.     Allergies:   Sulfonamide derivatives; Doxycycline; Latex; Ciprofloxacin; Fish allergy; Hydrocortisone; and Neomycin    Social History:  The patient  reports that she has never smoked. She has never used smokeless tobacco. She reports that she does not drink alcohol or use drugs.   Family History:  The patient's family history includes Asthma in her sister; Breast cancer in her unknown relative; Colon polyps in her sister; Coronary artery disease in her unknown relative; Heart attack in her mother; Heart disease in her mother; Hypertension in her mother.    ROS: All other systems are reviewed and negative. Unless otherwise mentioned in H&P    PHYSICAL EXAM: VS:  There were no vitals taken for this visit. , BMI There is no height or weight on file to calculate BMI. GEN: Well nourished, well developed, in no acute distress  HEENT: normal  Neck: no JVD, carotid bruits, or masses Cardiac: ***RRR; no murmurs, rubs, or gallops,no edema  Respiratory:  clear to auscultation bilaterally, normal work of breathing GI: soft, nontender, nondistended, + BS MS: no deformity or atrophy  Skin: warm and dry, no rash Neuro:  Strength and sensation are intact Psych: euthymic mood, full affect   EKG:  EKG {ACTION; IS/IS [id number]} ordered today. The ekg ordered today demonstrates ***   Recent Labs: 03/21/2017: B Natriuretic Peptide 26.1 04/19/2017: ALT 19; BUN 19; Creatinine, Ser 1.01; Hemoglobin 11.7; Platelets 330; Potassium 3.4; Sodium 139    Lipid Panel    Component Value Date/Time   CHOL 169 02/20/2015 0440  TRIG 310 (H)  02/20/2015 0440   HDL 35 (L) 02/20/2015 0440   CHOLHDL 4.8 02/20/2015 0440   VLDL 62 (H) 02/20/2015 0440   LDLCALC 72 02/20/2015 0440      Wt Readings from Last 3 Encounters:  05/23/17 295 lb (133.8 kg)  05/05/17 295 lb (133.8 kg)  04/29/17 (!) 305 lb (138.3 kg)      Other studies Reviewed: Additional studies/ records that were reviewed today include: ***. Review of the above records demonstrates: ***   ASSESSMENT AND PLAN:  1.  ***   Current medicines are reviewed at length with the patient today.    Labs/ tests ordered today include: *** Phill Myron. West Pugh, ANP, AACC   06/16/2017 10:42 AM    [facility] [address] [place], [place] [address] Phone: [phone]; Fax: [phone]

## 2017-06-17 ENCOUNTER — Ambulatory Visit: Payer: 59 | Admitting: Adult Health

## 2017-06-19 ENCOUNTER — Encounter: Payer: 59 | Admitting: Diagnostic Neuroimaging

## 2017-06-20 ENCOUNTER — Encounter: Payer: 59 | Admitting: Adult Health

## 2017-06-28 ENCOUNTER — Other Ambulatory Visit: Payer: Self-pay | Admitting: Pulmonary Disease

## 2017-07-03 ENCOUNTER — Encounter: Payer: 59 | Admitting: Adult Health

## 2017-07-09 ENCOUNTER — Ambulatory Visit: Payer: 59 | Admitting: Cardiovascular Disease
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
## 2017-07-11 ENCOUNTER — Other Ambulatory Visit: Payer: Self-pay | Admitting: Internal Medicine

## 2017-07-11 NOTE — Telephone Encounter (Signed)
Do you want to keep refilling this since she is not scheduled to f/u with you, please advise thanks

## 2017-07-14 ENCOUNTER — Encounter (HOSPITAL_COMMUNITY): Payer: Self-pay | Admitting: Emergency Medicine

## 2017-07-14 ENCOUNTER — Emergency Department (HOSPITAL_COMMUNITY)
Admission: EM | Admit: 2017-07-14 | Discharge: 2017-07-14 | Disposition: A | Discharge status: Home / Self Care | Payer: 59 | Attending: Emergency Medicine | Admitting: Emergency Medicine

## 2017-07-14 ENCOUNTER — Emergency Department (HOSPITAL_COMMUNITY): Payer: 59

## 2017-07-14 DIAGNOSIS — M25561 Pain in right knee: Secondary | ICD-10-CM | POA: Diagnosis not present

## 2017-07-14 DIAGNOSIS — J449 Chronic obstructive pulmonary disease, unspecified: Secondary | ICD-10-CM | POA: Insufficient documentation

## 2017-07-14 DIAGNOSIS — I130 Hypertensive heart and chronic kidney disease with heart failure and stage 1 through stage 4 chronic kidney disease, or unspecified chronic kidney disease: Secondary | ICD-10-CM | POA: Insufficient documentation

## 2017-07-14 DIAGNOSIS — D72829 Elevated white blood cell count, unspecified: Secondary | ICD-10-CM | POA: Diagnosis not present

## 2017-07-14 DIAGNOSIS — E876 Hypokalemia: Secondary | ICD-10-CM | POA: Insufficient documentation

## 2017-07-14 DIAGNOSIS — Z7982 Long term (current) use of aspirin: Secondary | ICD-10-CM | POA: Insufficient documentation

## 2017-07-14 DIAGNOSIS — N183 Chronic kidney disease, stage 3 (moderate): Secondary | ICD-10-CM | POA: Diagnosis not present

## 2017-07-14 DIAGNOSIS — E785 Hyperlipidemia, unspecified: Secondary | ICD-10-CM | POA: Diagnosis not present

## 2017-07-14 DIAGNOSIS — Z79899 Other long term (current) drug therapy: Secondary | ICD-10-CM | POA: Diagnosis not present

## 2017-07-14 DIAGNOSIS — I5032 Chronic diastolic (congestive) heart failure: Secondary | ICD-10-CM | POA: Diagnosis not present

## 2017-07-14 DIAGNOSIS — I2510 Atherosclerotic heart disease of native coronary artery without angina pectoris: Secondary | ICD-10-CM | POA: Insufficient documentation

## 2017-07-14 DIAGNOSIS — Z91040 Latex allergy status: Secondary | ICD-10-CM | POA: Insufficient documentation

## 2017-07-14 LAB — BASIC METABOLIC PANEL
ANION GAP: 9 (ref 5–15)
BUN: 22 mg/dL — ABNORMAL HIGH (ref 6–20)
CO2: 30 mmol/L (ref 22–32)
CREATININE: 0.94 mg/dL (ref 0.44–1.00)
Calcium: 8.9 mg/dL (ref 8.9–10.3)
Chloride: 102 mmol/L (ref 101–111)
GFR calc non Af Amer: >60 mL/min (ref >60)
Glucose, Bld: 102 mg/dL — ABNORMAL HIGH (ref 65–99)
Potassium: 3.1 mmol/L — ABNORMAL LOW (ref 3.5–5.1)
Sodium: 141 mmol/L (ref 135–145)

## 2017-07-14 LAB — CBC
HCT: 35.1 % — ABNORMAL LOW (ref 36.0–46.0)
Hemoglobin: 11.3 g/dL — ABNORMAL LOW (ref 12.0–15.0)
MCH: 29.7 pg (ref 26.0–34.0)
MCHC: 32.2 g/dL (ref 30.0–36.0)
MCV: 92.4 fL (ref 78.0–100.0)
Platelets: 293 10*3/uL (ref 150–400)
RBC: 3.8 MIL/uL — ABNORMAL LOW (ref 3.87–5.11)
RDW: 13.9 % (ref 11.5–15.5)
WBC: 12.7 10*3/uL — AB (ref 4.0–10.5)

## 2017-07-14 MED ORDER — LIDOCAINE HCL (PF) 1 % IJ SOLN
10.0000 mL | Freq: Once | INTRAMUSCULAR | Status: AC
  Administered 2017-07-14: 10 mL
  Filled 2017-07-14: qty 30

## 2017-07-14 MED ORDER — HYDROCODONE-ACETAMINOPHEN 5-325 MG PO TABS
1.0000 | ORAL_TABLET | Freq: Once | ORAL | Status: AC
  Administered 2017-07-14: 1 via ORAL
  Filled 2017-07-14: qty 1

## 2017-07-14 MED ORDER — POTASSIUM CHLORIDE CRYS ER 20 MEQ PO TBCR
40.0000 meq | EXTENDED_RELEASE_TABLET | Freq: Once | ORAL | Status: AC
  Administered 2017-07-14: 40 meq via ORAL
  Filled 2017-07-14: qty 2

## 2017-07-14 MED ORDER — ACETAMINOPHEN 325 MG PO TABS: 650.0000 mg | ORAL_TABLET | Freq: Four times a day (QID) | ORAL | 0 refills | Status: DC | PRN

## 2017-07-14 NOTE — ED Notes (Signed)
Right leg elevated and iced, per pt request

## 2017-07-14 NOTE — Discharge Instructions (Addendum)
It was my pleasure taking care of you today!   Please see the information and instructions below regarding your visit.  Your diagnoses today include:  1. Acute pain of right knee   2. Leukocytosis, unspecified type     Tests performed today include: See side panel of your discharge paperwork for testing performed today. Vital signs are listed at the bottom of these instructions.   Your x-ray shows osteoarthritis.   Your x-ray does not show significant fluid on the knee.  Medications prescribed:    Take any prescribed medications only as prescribed, and any over the counter medications only as directed on the packaging.  Please use topical Arnica gel over the counter. Continue your home Tramadol.  Home care instructions:  Please follow any educational materials contained in this packet.   Please wear the knee sleeve at all times while ambulating.  You also may use the walker to rehabilitate the knee.  Please discuss with your physical therapist alteration of your exercises in order to rehabilitate your knee and rest it.  I ordered a walker for you that can come from [facility], who should be contacting you in the next couple days.  Alternatively, you may obtain a walker from [facility] for $3.  Follow-up instructions: Please follow-up with your primary care provider as soon as possible for further evaluation of your symptoms if they are not completely improved.   Call the orthopedist listed today or tomorrow to schedule follow up appointment for recheck of ongoing knee pain in one to two weeks. That appointment can be canceled with a 24-48 hour notice if complete resolution of pain.   Follow up with the orthopedist listed if symptoms do not improve in one week.   Return instructions:  Please return to the Emergency Department if you experience worsening symptoms.  Please return for any increasing swelling, increasing pain, loss of color to the lower leg, or increasing  redness. Please return if you have any other emergent concerns.  Additional Information:   Your vital signs today were: BP 127/60    Pulse (!) 59    Temp 98.4 F (36.9 C) (Oral)    Resp 14    SpO2 94%  If your blood pressure (BP) was elevated on multiple readings during this visit above 130 for the top number or above 80 for the bottom number, please have this repeated by your primary care provider within one month. --------------

## 2017-07-14 NOTE — ED Notes (Signed)
Bed: WA09 Expected date:  Expected time:  Means of arrival:  Comments: ems

## 2017-07-14 NOTE — ED Triage Notes (Signed)
Pt reports right knee pain and not able to bear weight on it. repors got injection in it at orhtopedic but still very painful. Reports was instructed to come here for further eval due to something possibly wrong.

## 2017-07-14 NOTE — ED Notes (Signed)
Bed: WHALB Expected date:  Expected time:  Means of arrival:  Comments: 

## 2017-07-14 NOTE — ED Provider Notes (Signed)
[facility] DEPT Provider Note   CSN: [id number] Arrival date & time: 07/14/17  1017     History   Chief Complaint Chief Complaint  Patient presents with  . Knee Pain    HPI [name] is a 57 y.o. female.  HPI   Patient is a 57 year old female with a history of heart failure with preserved ejection fraction, anemia, anxiety, CAD, COPD, GERD presenting for right knee pain.  Patient reports that the pain began rather suddenly while at rest 5 days ago.  Patient reports that she has had pain with ambulation and movement of the knee.  Patient presented to the ED clinic title flex attendings on 07-09-2017, where clinician attempted the injection as well as removal of fluid.  Patient reports that she had minimal improvement, and clinic stated they wanted her evaluated at the emergency department.  Patient reports she was told that she has fluid on the joint.  Patient is denying any fevers, chills, pain with passive flexion extension, erythematous joint.  Past Medical History:  Diagnosis Date  . Acute on chronic diastolic CHF (congestive heart failure) ([place]) 04/11/2014  . Acute suppurative otitis media without spontaneous rupture of eardrum 02/04/2007   Centricity Description: OTITIS MEDIA, SUPPURATIVE, ACUTE, BILATERAL Qualifier: Diagnosis of  By: [name] MD, [name]  Centricity Description: OTITIS MEDIA, SUPPURATIVE, ACUTE Qualifier: Diagnosis of  By: [name] MD, [name]   . Allergic rhinitis   . Allergy   . Anemia   . Anxiety   . Arthritis   . CAD (coronary artery disease)    LHC (11/29/1999):  EF 60%; no significant CAD.  [name] Carpal tunnel syndrome   . Carpal tunnel syndrome 06/09/2006   Qualifier: Diagnosis of  By: Marca Ancona RMA, Lucy    . Chronic diastolic CHF (congestive heart failure) ([place])    a. Echo (01/21/11): Vigorous LVF, EF 65-70%, no RWMA, Gr 2 DD. b.  Echo (12/15): Moderate LVH, EF 09-81%, grade 2 diastolic dysfunction, mild LAE, normal RV function  c. 01/2015: echo showing EF of 60-65% with mild LVH  . Common migraine   . COPD (chronic obstructive pulmonary disease) ([place])   . Diastolic heart failure ([place])   . Diverticulitis   . GERD (gastroesophageal reflux disease)   . Hepatic steatosis   . Hepatomegaly   . Hiatal hernia   . Hyperlipidemia   . Hypertension   . IBS (irritable bowel syndrome)   . Internal hemorrhoids   . Morbid obesity ([place])   . OSA (obstructive sleep apnea)    CPAP dependent  . PNEUMONIA, ORGAN UNSPECIFIED 03/01/2009   Qualifier: Diagnosis of  By: [name] CMA, [name]    . Sinusitis, chronic 03/10/2012   CT sinuses 02/2012:  Chronic sinusitis with small A/F levels >> rx by ENT with prolonged augmentin F/u ct sinuses 04/2012:  Persistent sinus thickening >> rx with levaquin and clinda for 30 days.  Told to return for sinus scan in 6 weeks >> no showed for followup visit.  CT sinuses 11/2013:  Acute and chronic sinusitis noted.    . Sleep apnea    bi pap    Patient Active Problem List   Diagnosis Date Noted  . Polyp of sigmoid colon   . CKD (chronic kidney disease) stage 3, GFR 30-59 ml/min ([facility]) 07/08/2016  . Cough variant asthma 07/31/2015  . Upper airway cough syndrome 04/11/2015  . Bronchitis, chronic obstructive w acute bronchitis ([place]) 04/11/2015  . Dyspnea   . Acute on chronic  diastolic CHF (congestive heart failure), NYHA class 2 ([place]) 03/27/2015  . Morbid obesity due to excess calories ([place]) 03/27/2015  . Dysphagia 03/27/2015  . Hyponatremia 03/27/2015  . Acute kidney injury ([place]) 03/27/2015  . Hypokalemia 03/27/2015  . Leukocytosis 03/27/2015  . COPD exacerbation ([place]) 03/27/2015  . Sinusitis, chronic 03/10/2012  . Hyperlipidemia 10/02/2006  . Obstructive sleep apnea 06/09/2006  . Essential hypertension 06/09/2006    Past Surgical History:  Procedure Laterality Date  . ABDOMINAL HYSTERECTOMY    . CESAREAN SECTION    . COLONOSCOPY    . COLONOSCOPY WITH PROPOFOL N/A 05/05/2017   Procedure:  COLONOSCOPY WITH PROPOFOL;  Surgeon: Mauri Pole, MD;  Location: [facility]COPY;  Service: Endoscopy;  Laterality: N/A;  . NASAL SINUS SURGERY    . PARTIAL HYSTERECTOMY    . TONSILLECTOMY    . TUBAL LIGATION    . UVULOPALATOPHARYNGOPLASTY       OB History   None      Home Medications    Prior to Admission medications   Medication Sig Start Date End Date Taking? Authorizing Provider  albuterol (PROVENTIL) (2.5 MG/3ML) 0.083% nebulizer solution Take 3 mLs (2.5 mg total) by nebulization every 4 (four) hours as needed for wheezing or shortness of breath. 05/01/17  Yes Tanda Rockers, MD  aspirin 81 MG EC tablet TAKE 1 TABLET BY MOUTH EVERY DAY 06/06/17  Yes Skeet Latch, MD  benzonatate (TESSALON) 200 MG capsule TAKE 1 CAPSULE(200 MG) BY MOUTH THREE TIMES DAILY AS NEEDED FOR COUGH 06/30/17  Yes Chesley Mires, MD  carvedilol (COREG) 12.5 MG tablet Take 12.5 mg by mouth 2 (two) times daily with a meal.   Yes [provider]  chlorpheniramine (CHLOR-TRIMETON) 4 MG tablet Take 8 mg by mouth at bedtime. May add 1 extra every 4 hours as needed for drainage, drippy nose, throat clearing   Yes [provider]  Dextromethorphan-Guaifenesin (MUCINEX DM MAXIMUM STRENGTH) 60-1200 MG TB12 Take 1 tablet by mouth 2 (two) times daily as needed (cough).    Yes [provider]  Diclofenac Sodium 3 % GEL Apply 1 application topically 3 (three) times daily. Knee pain   Yes [provider]  dicyclomine (BENTYL) 10 MG capsule TAKE 1 CAPSULE(10 MG) BY MOUTH FOUR TIMES DAILY BEFORE MEALS AND AT BEDTIME 12/04/15  Yes Irene Shipper, MD  famotidine (PEPCID) 20 MG tablet Take 1 tablet (20 mg total) by mouth daily at 3 pm. 06/12/17  Yes Tanda Rockers, MD  fluticasone (FLONASE) 50 MCG/ACT nasal spray SPRAY 2 SPRAYS INTO EACH NOSTRIL EVERY DAY 05/30/17  Yes Chesley Mires, MD  gabapentin (NEURONTIN) 100 MG capsule TAKE ONE CAPSULE BY MOUTH THREE TIMES DAILY Patient taking  differently: TAKE ONE CAPSULE BY MOUTH THREE TIMES DAILY AS NEEDED FOR PAIN. 07/11/17  Yes Tanda Rockers, MD  hydrALAZINE (APRESOLINE) 25 MG tablet Take 1 tablet (25 mg total) by mouth 3 (three) times daily. 03/27/17 07/14/17 Yes Lendon Colonel, NP  isosorbide dinitrate (ISORDIL) 20 MG tablet Take 20 mg by mouth 3 (three) times daily.  04/01/17  Yes [provider]  lidocaine (LIDODERM) 5 % Place 1 patch onto the skin daily. Remove & Discard patch within 12 hours or as directed by MD Patient taking differently: Place 1 patch onto the skin daily as needed (for pain). Remove & Discard patch within 12 hours or as directed by MD 03/22/17  Yes Antonietta Breach, PA-C  loratadine-pseudoephedrine (CLARITIN-D 24-HOUR) 10-240 MG 24 hr tablet Take  1 tablet by mouth daily as needed for allergies.    Yes [provider]  losartan (COZAAR) 50 MG tablet TAKE 0.5 TABLET (50 MG TOTAL) BY MOUTH DAILY. 05/19/17  Yes Skeet Latch, MD  meclizine (ANTIVERT) 25 MG tablet Take 25 mg by mouth every 8 (eight) hours as needed for dizziness.   Yes [provider]  meloxicam (MOBIC) 15 MG tablet Take 15 mg by mouth daily at 3 pm.  03/31/17  Yes [provider]  metolazone (ZAROXOLYN) 2.5 MG tablet Take 2.5 mg by mouth daily as needed (FOR OVERNIGHT WEIGHT GAIN OF 3 lbs. or more).   Yes [provider]  montelukast (SINGULAIR) 10 MG tablet Take 1 tablet (10 mg total) by mouth at bedtime. 10/15/16  Yes Juanito Doom, MD  Multiple Vitamins-Minerals (EMERGEN-C IMMUNE PLUS PO) Take 1 packet by mouth daily.   Yes [provider]  pantoprazole (PROTONIX) 40 MG tablet TAKE 1 TABLET BY MOUTH DAILY, TAKE 30 TO 60 MINUTES BEFORE FIRST MEAL OF THE DAY Patient taking differently: Take 40 mg by mouth 2 (two) times daily before a meal. TAKE 1 TABLET BY MOUTH DAILY, TAKE 30 TO 60 MINUTES BEFORE FIRST MEAL OF THE DAY 03/25/17  Yes Nandigam, Venia Minks, MD  polyethylene glycol powder  (GLYCOLAX/MIRALAX) powder Use 2 capfuls in the morning and 1 capful in the evening and as directed Patient taking differently: Take 17-34 g by mouth every other day. Use 2 capfuls in the morning and 1 capful in the evening 03/24/17  Yes Nandigam, Venia Minks, MD  potassium chloride SA (K-DUR,KLOR-CON) 20 MEQ tablet TAKE 3 TABLETS BY MOUTH THREE TIMES DAILY Patient taking differently: TAKE 3 TABLETS (60 MEQ) BY MOUTH THREE TIMES DAILY 03/03/17  Yes Minus Breeding, MD  promethazine (PHENERGAN) 25 MG tablet Take 1 tablet (25 mg total) by mouth every 6 (six) hours as needed for nausea or vomiting. 04/18/17  Yes Lawyer, Harrell Gave, PA-C  Respiratory Therapy Supplies (FLUTTER) DEVI 1 application by Does not apply route as directed. 12/27/16  Yes Tanda Rockers, MD  spironolactone (ALDACTONE) 25 MG tablet TAKE 0.5 TABLETS (12.5 MG TOTAL) BY MOUTH 2 (TWO) TIMES DAILY. 10/22/16  Yes Skeet Latch, MD  topiramate (TOPAMAX) 25 MG tablet Take 25 mg by mouth 2 (two) times daily as needed (For migraines.).    Yes [provider]  torsemide (DEMADEX) 20 MG tablet TAKE 2 TABLETS(40 MG) BY MOUTH TWICE DAILY 06/02/17  Yes Skeet Latch, MD  traMADol (ULTRAM) 50 MG tablet Take 1 tablet (50 mg total) by mouth every 6 (six) hours as needed for severe pain. Patient taking differently: Take 100 mg by mouth every 4 (four) hours as needed for severe pain (For pain.).  04/18/17  Yes Lawyer, Harrell Gave, PA-C  UNABLE TO FIND Med Name: CPAP with sleep   Yes [provider]    Family History Family History  Problem Relation Age of Onset  . Heart disease Mother   . Heart attack Mother   . Hypertension Mother   . Coronary artery disease Unknown        1st degree relatvie <50  . Breast cancer Unknown        aunt- ? paternal or maternal  . Asthma Sister   . Colon polyps Sister   . Stroke Neg Hx   . Colon cancer Neg Hx   . Esophageal cancer Neg Hx   . Rectal cancer Neg Hx   . Stomach cancer Neg Hx  Social History Social History   Tobacco Use  . Smoking status: Never Smoker  . Smokeless tobacco: Never Used  Substance Use Topics  . Alcohol use: No  . Drug use: No     Allergies   Sulfonamide derivatives; Doxycycline; Latex; Ciprofloxacin; Fish allergy; Hydrocortisone; and Neomycin   Review of Systems Review of Systems  Constitutional: Negative for chills and fever.  Gastrointestinal: Negative for abdominal pain, nausea and vomiting.  Musculoskeletal: Positive for arthralgias and joint swelling.  Skin: Negative for wound.  Neurological: Negative for weakness and numbness.  All other systems reviewed and are negative.    Physical Exam Updated Vital Signs BP (!) 98/58   Pulse 69   Temp 98.4 F (36.9 C) (Oral)   Resp 14   SpO2 93%   Physical Exam  Constitutional: She appears well-developed and well-nourished. No distress.  HENT:  Head: Normocephalic and atraumatic.  Mouth/Throat: Oropharynx is clear and moist.  Eyes: Pupils are equal, round, and reactive to light. Conjunctivae and EOM are normal.  Neck: Normal range of motion. Neck supple.  Cardiovascular: Normal rate, regular rhythm, S1 normal and S2 normal.  No murmur heard. Pulmonary/Chest: Effort normal and breath sounds normal. She has no wheezes. She has no rales.  Abdominal: Soft. She exhibits no distension.  Musculoskeletal:  Right knee with tenderness to palpation of anterior joint line.  Patient has full range of motion with passive range of motion. Patient does not want to actively move the joint d/t pain. Mild effusion of superomedial knee. No abnormal alignment or patellar mobility. No bruising, erythema or warmth overlaying the joint. No varus/valgus laxity. Negative drawer's, Lachman's and McMurray's.  No crepitus.  2+ DP pulses bilaterally. All compartments are soft. Sensation intact distal to injury. Patient ambulated with antalgic gait in the emergency department with walker.  Lymphadenopathy:     She has no cervical adenopathy.  Neurological: She is alert.  Cranial nerves grossly intact. Patient moves extremities symmetrically and with good coordination.  Skin: Skin is warm and dry. No rash noted. No erythema.  Psychiatric: She has a normal mood and affect. Her behavior is normal. Judgment and thought content normal.  Nursing note and vitals reviewed.    ED Treatments / Results  Labs (all labs ordered are listed, but only abnormal results are displayed) Labs Reviewed - No data to display  EKG None  Radiology Dg Knee Complete 4 Views Right  Result Date: 07/14/2017 CLINICAL DATA:  Knee pain. EXAM: RIGHT KNEE - COMPLETE 4+ VIEW COMPARISON:  None FINDINGS: Generalized osteopenia. No acute fracture or dislocation. Mild-moderate right medial femorotibial compartment joint space narrowing with small marginal osteophytes. No significant joint effusion. IMPRESSION: 1.  No acute osseous injury of the right knee. 2. 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 Electronically Signed   By: Kathreen Devoid   On: 07/14/2017 13:19    Procedures Procedures (including critical care time)  Medications Ordered in ED Medications  HYDROcodone-acetaminophen (NORCO/VICODIN) 5-325 MG per tablet 1 tablet (has no administration in time range)     Initial Impression / Assessment and Plan / ED Course  I have reviewed the triage vital signs and the nursing notes.  Pertinent labs & imaging results that were available during my care of the patient were reviewed by me and considered in my medical decision making (see chart for details).  Clinical Course as of Jul 15 1702  Mon Jul 14, 2017  1553 Leukocytosis noted.  Differential not obtained.  Patient has persistent  leukocytosis and is at baseline.  Will have patient follow-up with primary care provider for differential.   [AM]    Clinical Course User Index [AM] Albesa Seen, PA-C    Patient is nontoxic-appearing,  afebrile, and in no acute distress.  Patient's knee exhibits mild effusion, differential including hemarthrosis, inflammatory effusion, or infectious etiology.  Given that patient does not have significant pain with passive range of motion, and does not have an erythematous or significantly swollen joint, feel that septic arthritis is less likely.  Per my evaluation and independent evaluation by Dr. Theotis Burrow, feel that there is not significant amount of fluid on the knee that warrants arthrocentesis of the knee.  Patient may still have a small effusion secondary to osteoarthritis, but the joint does not appear to be septic, and we reviewed the risks and benefits with the patient of performing arthrocentesis, including introduction of infection if it was not present.  Patient elected to defer arthrocentesis at present.   Patient was placed in a knee sleeve, and walker was ordered for patient.  Patient is to follow-up with her prior orthopedic physician recommended rest.  Lab work incidentally noted leukocytosis which is chronic over time.  Patient reports that she is previous been cleared by hematology of this.  Patient also has hypokalemia 3.1.  Patient is on chronic potassium supplementation, and this was repleted in the emergency department today.  This is a shared visit with Dr. Theotis Burrow. Patient was independently evaluated by this attending physician. Attending physician consulted in evaluation and discharge management.  Final Clinical Impressions(s) / ED Diagnoses   Final diagnoses:  Acute pain of right knee  Leukocytosis, unspecified type  Hypokalemia    ED Discharge Orders        Ordered    For home use only DME Walker     07/14/17 1658    acetaminophen (TYLENOL) 325 MG tablet  Every 6 hours PRN     07/14/17 1658       Albesa Seen, PA-C 07/14/17 1709    Little, Wenda Overland, MD 07/17/17 [phone]

## 2017-07-14 NOTE — Progress Notes (Signed)
Clinical Education officer, museum (CSW) spoke to night-time provider who is requesting a walker for the pt.  CSW confirmed CM consult is in place for CM by the daytime provider.  Please reconsult if future social work needs arise.  CSW signing off, as social work intervention is no longer needed.  Alphonse Guild. Leelynn Whetsel, LCSW, LCAS, CSI Clinical Social Worker Ph: [phone]

## 2017-07-15 NOTE — Care Management Note (Signed)
Case Management Note  Patient Details  Name: [name] MRN: [id number] Date of Birth: 12-05-1960  CM consulted for DME walker.  Contacted [name] with [facility] who stated they would have the walker delivered.  Charlotte Fells, PA via messages.  No further CM needs noted at this time.  Expected Discharge Date:   07/14/2017               Expected Discharge Plan:  Home/Self Care  Discharge planning Services  CM Consult  Post Acute Care Choice:  Durable Medical Equipment Choice offered to:  Patient  DME Arranged:  [name] rolling DME Agency:  [facility].  Status of Service:  Completed, signed off  Rae Mar, RN 07/15/2017, 9:25 AM

## 2017-07-22 ENCOUNTER — Other Ambulatory Visit: Payer: Self-pay | Admitting: Pulmonary Disease

## 2017-07-25 ENCOUNTER — Other Ambulatory Visit: Payer: Self-pay | Admitting: Student

## 2017-07-25 ENCOUNTER — Other Ambulatory Visit: Payer: Self-pay | Admitting: Adult Health

## 2017-07-25 DIAGNOSIS — R05 Cough: Secondary | ICD-10-CM

## 2017-07-25 DIAGNOSIS — R058 Other specified cough: Secondary | ICD-10-CM

## 2017-07-27 ENCOUNTER — Other Ambulatory Visit: Payer: Self-pay | Admitting: Student

## 2017-07-29 ENCOUNTER — Other Ambulatory Visit: Payer: Self-pay | Admitting: Cardiology

## 2017-07-29 NOTE — Telephone Encounter (Signed)
Rx sent to pharmacy   

## 2017-07-31 ENCOUNTER — Encounter: Payer: Medicare Other | Admitting: Diagnostic Neuroimaging

## 2017-08-06 ENCOUNTER — Other Ambulatory Visit: Payer: Self-pay | Admitting: Pulmonary Disease

## 2017-08-06 NOTE — Telephone Encounter (Signed)
Dr. [name], please advise if it is okay to refill benzonatate for pt. Thanks!

## 2017-08-09 ENCOUNTER — Other Ambulatory Visit: Payer: Self-pay | Admitting: Internal Medicine

## 2017-08-14 ENCOUNTER — Other Ambulatory Visit: Payer: Self-pay | Admitting: Internal Medicine

## 2017-08-16 ENCOUNTER — Other Ambulatory Visit: Payer: Self-pay | Admitting: Internal Medicine

## 2017-08-28 ENCOUNTER — Other Ambulatory Visit: Payer: Self-pay | Admitting: Internal Medicine

## 2017-09-03 ENCOUNTER — Other Ambulatory Visit: Payer: Self-pay | Admitting: Pulmonary Disease

## 2017-09-03 ENCOUNTER — Telehealth: Payer: Self-pay | Admitting: Pulmonary Disease

## 2017-09-03 MED ORDER — BENZONATATE 200 MG PO CAPS: 200.0000 mg | ORAL_CAPSULE | Freq: Three times a day (TID) | ORAL | 0 refills | Status: DC | PRN

## 2017-09-03 NOTE — Telephone Encounter (Signed)
Okay to send refill for benzonatate 200 mg tid prn cough.  #30 with 1 refill.

## 2017-09-03 NOTE — Telephone Encounter (Signed)
Patient returned called she can be reached at [phone]

## 2017-09-03 NOTE — Telephone Encounter (Signed)
Called and spoke with pt to verify that she was wanting a refill of benzonatate. Pt verified that that was correct.  Per VS, it was okay for [place] to refill med.  Verified that the [facility] in [place] was the correct pharmacy. Pt stated that was correct.  Sent a refill of med to stated pharmacy for pt. Nothing further needed.

## 2017-09-03 NOTE — Telephone Encounter (Signed)
Received a refill request from pt's pharmacy of benzonatate.  Attempted to call pt to verify the refill request that was received but unable to reach pt. Left message for pt to return call.  Dr. [name], please advise if it is okay for [place] to refill benzonatate 200mg  for pt. Med last filled 08/08/17, #20 caps. Thanks!

## 2017-09-09 ENCOUNTER — Telehealth: Payer: Self-pay | Admitting: Cardiovascular Disease

## 2017-09-09 MED ORDER — HYDRALAZINE HCL 25 MG PO TABS: 25.0000 mg | ORAL_TABLET | Freq: Three times a day (TID) | ORAL | 1 refills | Status: DC

## 2017-09-09 NOTE — Telephone Encounter (Addendum)
New Message:   *STAT* If patient is at the pharmacy, call can be transferred to refill team.   1. Which medications need to be refilled? (please list name of each medication and dose if known) hydrALAZINE (APRESOLINE) 25 MG tablet(Expired)  2. Which pharmacy/location (including street and city if local pharmacy) is medication to be sent to? [id number] - [place], [place]  3. Do they need a 30 day or 90 day supply? 90 Days

## 2017-09-23 ENCOUNTER — Other Ambulatory Visit: Payer: Self-pay | Admitting: Cardiology

## 2017-09-30 ENCOUNTER — Telehealth: Payer: Self-pay | Admitting: Gastroenterology

## 2017-09-30 ENCOUNTER — Other Ambulatory Visit: Payer: Self-pay | Admitting: Internal Medicine

## 2017-09-30 NOTE — Telephone Encounter (Signed)
Home from [place]. Complains of right lower quadrant pain. Afebrile. No constipation. Nagging pain she thinks has been there about a week. Appointment scheduled with Tye Savoy.

## 2017-10-01 ENCOUNTER — Other Ambulatory Visit: Payer: Self-pay | Admitting: Internal Medicine

## 2017-10-02 ENCOUNTER — Ambulatory Visit (INDEPENDENT_AMBULATORY_CARE_PROVIDER_SITE_OTHER): Payer: 59 | Admitting: Nurse Practitioner

## 2017-10-02 ENCOUNTER — Encounter: Payer: Self-pay | Admitting: Nurse Practitioner

## 2017-10-02 VITALS — BP 126/68 | HR 69 | Ht 63.0 in | Wt 295.0 lb

## 2017-10-02 DIAGNOSIS — R1032 Left lower quadrant pain: Secondary | ICD-10-CM | POA: Diagnosis not present

## 2017-10-02 MED ORDER — AMOXICILLIN-POT CLAVULANATE 875-125 MG PO TABS: 1.0000 | ORAL_TABLET | Freq: Two times a day (BID) | ORAL | 0 refills | Status: DC

## 2017-10-02 NOTE — Patient Instructions (Signed)
If you are age 59 or older, your body mass index should be between 23-30. Your Body mass index is 52.26 kg/m. If this is out of the aforementioned range listed, please consider follow up with your Primary Care Provider.  If you are age 25 or younger, your body mass index should be between 19-25. Your Body mass index is 52.26 kg/m. If this is out of the aformentioned range listed, please consider follow up with your Primary Care Provider.   We have sent the following medications to your pharmacy for you to pick up at your convenience: Augmentin  Call if pain gets worse or doesn't resolve.  Thank you for choosing me and [facility].   Tye Savoy, NP

## 2017-10-02 NOTE — Progress Notes (Signed)
Primary GI:   Harl Bowie, MD   Chief Complaint:    Lower abdominal pain  IMPRESSION and PLAN:     57 year old female over a 1 week history of intermittent LLQ pain unrelated to activity, meals or bowel movements.  Seen for the pain in [place] last week and told possible diverticulitis but not given antibiotics.  Patient does not think she had a CT scan but had "an xray". -will treat empirically with a short course of Augmentin for possible diverticulitis but it should be noted that she has no significant diverticular disease on previous colonoscopy (April 2019) nor CT scans.  Patient she will let us know if she fails to improve with antibiotics, or if pain worsens in the interim.   HPI:     Patient is a patient is a 57 year old female with multiple medical problems not limited to morbid obesity, chronic diastolic heart failure, hypertension, OSA, GERD, and chronic constipation. Patient had screening colonoscopy April 2019 with findings of an 11 mm polyp in the sigmoid colon (Hamartomatous polyp, no adenomatous change)  Patient comes in today for evaluation of lower abdominal pain, predominantly LLQ.  The pain is intermittent, not affected by movement, eating or defecation. It is stabbing in nature and occurs several times a day.   She is taking dicyclomine as needed.  The pain started last week when she was in [place].  Went to an ED in [place], told she could have diverticulitis but was not given antibiotics.  Pain has persisted, she has come in here for further evaluation.  Her bowel movements are normal on MiraLAX.  She has no urinary complaints or vaginal complaints.  No rectal bleeding.  No nausea, vomiting or other GI complaints.  Review of systems:     No chest pain, no SOB, no fevers, no urinary sx   Past Medical History:  Diagnosis Date  . Acute on chronic diastolic CHF (congestive heart failure) ([place]) 04/11/2014  . Acute suppurative otitis media without spontaneous rupture  of eardrum 02/04/2007   Centricity Description: OTITIS MEDIA, SUPPURATIVE, ACUTE, BILATERAL Qualifier: Diagnosis of  By: [name] MD, [name]  Centricity Description: OTITIS MEDIA, SUPPURATIVE, ACUTE Qualifier: Diagnosis of  By: [name] MD, [name]   . Allergic rhinitis   . Allergy   . Anemia   . Anxiety   . Arthritis   . CAD (coronary artery disease)    LHC (11/29/1999):  EF 60%; no significant CAD.  [name] Carpal tunnel syndrome   . Carpal tunnel syndrome 06/09/2006   Qualifier: Diagnosis of  By: Marca Ancona RMA, Lucy    . Chronic diastolic CHF (congestive heart failure) ([place])    a. Echo (01/21/11): Vigorous LVF, EF 65-70%, no RWMA, Gr 2 DD. b.  Echo (12/15): Moderate LVH, EF 16-60%, grade 2 diastolic dysfunction, mild LAE, normal RV function c. 01/2015: echo showing EF of 60-65% with mild LVH  . Common migraine   . COPD (chronic obstructive pulmonary disease) ([place])   . Diastolic heart failure ([place])   . Diverticulitis   . GERD (gastroesophageal reflux disease)   . Hepatic steatosis   . Hepatomegaly   . Hiatal hernia   . Hyperlipidemia   . Hypertension   . IBS (irritable bowel syndrome)   . Internal hemorrhoids   . Morbid obesity ([place])   . OSA (obstructive sleep apnea)    CPAP dependent  . PNEUMONIA, ORGAN UNSPECIFIED 03/01/2009   Qualifier: Diagnosis of  By: [name] CMA, [name]    .  Sinusitis, chronic 03/10/2012   CT sinuses 02/2012:  Chronic sinusitis with small A/F levels >> rx by ENT with prolonged augmentin F/u ct sinuses 04/2012:  Persistent sinus thickening >> rx with levaquin and clinda for 30 days.  Told to return for sinus scan in 6 weeks >> no showed for followup visit.  CT sinuses 11/2013:  Acute and chronic sinusitis noted.    . Sleep apnea    bi pap    Patient's surgical history, family medical history, social history, medications and allergies were all reviewed in Epic   Creatinine clearance cannot be calculated (Patient's most recent lab result is older than the maximum 21 days  allowed.)  Current Outpatient Medications  Medication Sig Dispense Refill  . albuterol (PROVENTIL) (2.5 MG/3ML) 0.083% nebulizer solution Take 3 mLs (2.5 mg total) by nebulization every 4 (four) hours as needed for wheezing or shortness of breath. 300 mL 2  . albuterol (PROVENTIL) (2.5 MG/3ML) 0.083% nebulizer solution TAKE 3 MLS BY NEBULIZATION EVERY 4 HOURS AS NEEDED FOR WHEEZING OR SHORTNESS OF BREATH 300 mL 0  . aspirin 81 MG EC tablet TAKE 1 TABLET BY MOUTH EVERY DAY 90 tablet 2  . benzonatate (TESSALON) 200 MG capsule Take 1 capsule (200 mg total) by mouth 3 (three) times daily as needed for cough. 20 capsule 0  . carvedilol (COREG) 12.5 MG tablet Take 12.5 mg by mouth 2 (two) times daily with a meal.    . chlorpheniramine (CHLOR-TRIMETON) 4 MG tablet Take 8 mg by mouth at bedtime. May add 1 extra every 4 hours as needed for drainage, drippy nose, throat clearing    . Dextromethorphan-Guaifenesin (MUCINEX DM MAXIMUM STRENGTH) 60-1200 MG TB12 Take 1 tablet by mouth 2 (two) times daily as needed (cough).     . Diclofenac Sodium 3 % GEL Apply 1 application topically 3 (three) times daily. Knee pain    . dicyclomine (BENTYL) 10 MG capsule TAKE 1 CAPSULE(10 MG) BY MOUTH FOUR TIMES DAILY BEFORE MEALS AND AT BEDTIME 90 capsule 0  . famotidine (PEPCID) 20 MG tablet Take 1 tablet (20 mg total) by mouth daily at 3 pm. 90 tablet 1  . fluticasone (FLONASE) 50 MCG/ACT nasal spray USE 2 SPRAYS IN EACH NOSTRIL EVERY DAY 16 g 1  . gabapentin (NEURONTIN) 100 MG capsule TAKE ONE CAPSULE BY MOUTH THREE TIMES DAILY 90 capsule 0  . hydrALAZINE (APRESOLINE) 25 MG tablet Take 1 tablet (25 mg total) by mouth 3 (three) times daily. 270 tablet 1  . isosorbide dinitrate (ISORDIL) 20 MG tablet Take 20 mg by mouth 3 (three) times daily.   3  . losartan (COZAAR) 50 MG tablet TAKE 0.5 TABLET (50 MG TOTAL) BY MOUTH DAILY. 30 tablet 9  . meloxicam (MOBIC) 15 MG tablet Take 15 mg by mouth daily at 3 pm.   4  . metolazone  (ZAROXOLYN) 2.5 MG tablet Take 2.5 mg by mouth daily as needed (FOR OVERNIGHT WEIGHT GAIN OF 3 lbs. or more).    . montelukast (SINGULAIR) 10 MG tablet TAKE 1 TABLET(10 MG) BY MOUTH AT BEDTIME 30 tablet 1  . pantoprazole (PROTONIX) 40 MG tablet TAKE 1 TABLET BY MOUTH DAILY, TAKE 30 TO 60 MINUTES BEFORE FIRST MEAL OF THE DAY (Patient taking differently: Take 40 mg by mouth 2 (two) times daily before a meal. TAKE 1 TABLET BY MOUTH DAILY, TAKE 30 TO 60 MINUTES BEFORE FIRST MEAL OF THE DAY) 90 tablet 1  . polyethylene glycol powder (GLYCOLAX/MIRALAX) powder Use 2  capfuls in the morning and 1 capful in the evening and as directed (Patient taking differently: Take 17-34 g by mouth every other day. Use 2 capfuls in the morning and 1 capful in the evening) 527 g 11  . potassium chloride SA (K-DUR,KLOR-CON) 20 MEQ tablet TAKE 3 TABLETS (60 MEQ) BY MOUTH THREE TIMES DAILY 270 tablet 1  . promethazine (PHENERGAN) 25 MG tablet Take 1 tablet (25 mg total) by mouth every 6 (six) hours as needed for nausea or vomiting. 10 tablet 0  . Respiratory Therapy Supplies (FLUTTER) DEVI 1 application by Does not apply route as directed. 1 each 0  . spironolactone (ALDACTONE) 25 MG tablet TAKE 0.5 TABLETS (12.5 MG TOTAL) BY MOUTH 2 (TWO) TIMES DAILY. 90 tablet 3  . topiramate (TOPAMAX) 25 MG tablet Take 25 mg by mouth 2 (two) times daily as needed (For migraines.).     [name] torsemide (DEMADEX) 20 MG tablet TAKE 2 TABLETS(40 MG) BY MOUTH TWICE DAILY 120 tablet 3  . traMADol (ULTRAM) 50 MG tablet Take 1 tablet (50 mg total) by mouth every 6 (six) hours as needed for severe pain. (Patient taking differently: Take 100 mg by mouth every 4 (four) hours as needed for severe pain (For pain.). ) 15 tablet 0  . UNABLE TO FIND Med Name: CPAP with sleep     No current facility-administered medications for this visit.     Physical Exam:     BP 126/68   Pulse 69   Ht 5\' 3"  (1.6 m)   Wt 295 lb (133.8 kg)   BMI 52.26 kg/m   GENERAL:   Pleasant female in NAD PSYCH: : Cooperative, normal affect EENT:  conjunctiva pink, mucous membranes moist, neck supple without masses CARDIAC:  RRR,  no peripheral edema PULM: Normal respiratory effort, lungs CTA bilaterally, no wheezing ABDOMEN:  Obese, soft, mild-moderate LLQ tenderness,   normal bowel sounds SKIN:  turgor, no lesions seen Musculoskeletal:  Normal muscle tone, normal strength NEURO: Alert and oriented x 3, no focal neurologic deficits   Tye Savoy , NP 10/02/2017, 3:06 PM

## 2017-10-03 ENCOUNTER — Encounter: Payer: Self-pay | Admitting: Nurse Practitioner

## 2017-10-03 NOTE — Progress Notes (Signed)
Assessment and plans reviewed  

## 2017-10-07 ENCOUNTER — Telehealth: Payer: Self-pay | Admitting: Pulmonary Disease

## 2017-10-07 ENCOUNTER — Other Ambulatory Visit: Payer: Self-pay | Admitting: Pulmonary Disease

## 2017-10-07 NOTE — Telephone Encounter (Signed)
Please get a copy of her Bipap download.

## 2017-10-07 NOTE — Telephone Encounter (Signed)
Called and spoke with patient regarding refill on Benzonatate 200mg  Offered pt f/u ov with VS, she is out of town until 10/30/17 Scheduled appt with BW on 11/04/17 at 2:00pm  Pt states she is having issues with BIPAP pressure, and having severe cough more at night. VS please advise.

## 2017-10-08 NOTE — Telephone Encounter (Signed)
Bipap 09/07/17 to 10/06/17 >> used on 30 of 30 nights with average 8 hrs 47 min.  Average AHI 0.9 with Bipap 16/12 cm H2O.   Please let her know that her Bipap report shows good control of her sleep apnea.  If she feels her pressure is too high, then can send order to change Bipap to 14/10 cm H2O.

## 2017-10-08 NOTE — Telephone Encounter (Signed)
Download report has been pulled from [facility]. This has been placed in Dr. Juanetta Gosling look at for review.

## 2017-10-08 NOTE — Telephone Encounter (Signed)
Spoke with patient, made aware of recommendations per VS. Voiced understanding. Patient states she would rather wait until her appointment to discuss Bi-pap in further detail. Declined an earlier appointment.

## 2017-10-10 ENCOUNTER — Telehealth: Payer: Self-pay | Admitting: Nurse Practitioner

## 2017-10-12 ENCOUNTER — Emergency Department (HOSPITAL_COMMUNITY): Payer: 59

## 2017-10-12 ENCOUNTER — Encounter (HOSPITAL_COMMUNITY): Payer: Self-pay | Admitting: Emergency Medicine

## 2017-10-12 ENCOUNTER — Emergency Department (HOSPITAL_COMMUNITY)
Admission: EM | Admit: 2017-10-12 | Discharge: 2017-10-12 | Disposition: A | Discharge status: Home / Self Care | Payer: 59 | Attending: Emergency Medicine | Admitting: Emergency Medicine

## 2017-10-12 DIAGNOSIS — K5792 Diverticulitis of intestine, part unspecified, without perforation or abscess without bleeding: Secondary | ICD-10-CM

## 2017-10-12 DIAGNOSIS — I2510 Atherosclerotic heart disease of native coronary artery without angina pectoris: Secondary | ICD-10-CM | POA: Insufficient documentation

## 2017-10-12 DIAGNOSIS — K5732 Diverticulitis of large intestine without perforation or abscess without bleeding: Secondary | ICD-10-CM | POA: Insufficient documentation

## 2017-10-12 DIAGNOSIS — I130 Hypertensive heart and chronic kidney disease with heart failure and stage 1 through stage 4 chronic kidney disease, or unspecified chronic kidney disease: Secondary | ICD-10-CM | POA: Diagnosis not present

## 2017-10-12 DIAGNOSIS — Z91040 Latex allergy status: Secondary | ICD-10-CM | POA: Insufficient documentation

## 2017-10-12 DIAGNOSIS — J449 Chronic obstructive pulmonary disease, unspecified: Secondary | ICD-10-CM | POA: Diagnosis not present

## 2017-10-12 DIAGNOSIS — I5030 Unspecified diastolic (congestive) heart failure: Secondary | ICD-10-CM | POA: Insufficient documentation

## 2017-10-12 DIAGNOSIS — N183 Chronic kidney disease, stage 3 (moderate): Secondary | ICD-10-CM | POA: Insufficient documentation

## 2017-10-12 DIAGNOSIS — F419 Anxiety disorder, unspecified: Secondary | ICD-10-CM | POA: Insufficient documentation

## 2017-10-12 DIAGNOSIS — Z7982 Long term (current) use of aspirin: Secondary | ICD-10-CM | POA: Diagnosis not present

## 2017-10-12 DIAGNOSIS — Z79899 Other long term (current) drug therapy: Secondary | ICD-10-CM | POA: Insufficient documentation

## 2017-10-12 DIAGNOSIS — R1032 Left lower quadrant pain: Secondary | ICD-10-CM | POA: Diagnosis present

## 2017-10-12 LAB — COMPREHENSIVE METABOLIC PANEL
ALBUMIN: 3.1 g/dL — AB (ref 3.5–5.0)
ALT: 19 U/L (ref 0–44)
AST: 23 U/L (ref 15–41)
Alkaline Phosphatase: 93 U/L (ref 38–126)
Anion gap: 9 (ref 5–15)
BUN: 17 mg/dL (ref 6–20)
CHLORIDE: 105 mmol/L (ref 98–111)
CO2: 27 mmol/L (ref 22–32)
Calcium: 9 mg/dL (ref 8.9–10.3)
Creatinine, Ser: 0.96 mg/dL (ref 0.44–1.00)
GFR calc Af Amer: >60 mL/min (ref >60)
Glucose, Bld: 114 mg/dL — ABNORMAL HIGH (ref 70–99)
POTASSIUM: 3.6 mmol/L (ref 3.5–5.1)
SODIUM: 141 mmol/L (ref 135–145)
Total Bilirubin: 0.6 mg/dL (ref 0.3–1.2)
Total Protein: 7.4 g/dL (ref 6.5–8.1)

## 2017-10-12 LAB — CBC WITH DIFFERENTIAL/PLATELET
BASOS ABS: 0 10*3/uL (ref 0.0–0.1)
BASOS PCT: 0 %
EOS PCT: 5 %
Eosinophils Absolute: 0.4 10*3/uL (ref 0.0–0.7)
HCT: 35 % — ABNORMAL LOW (ref 36.0–46.0)
Hemoglobin: 11.2 g/dL — ABNORMAL LOW (ref 12.0–15.0)
Lymphocytes Relative: 31 %
Lymphs Abs: 2.6 10*3/uL (ref 0.7–4.0)
MCH: 29.9 pg (ref 26.0–34.0)
MCHC: 32 g/dL (ref 30.0–36.0)
MCV: 93.3 fL (ref 78.0–100.0)
Monocytes Absolute: 0.5 10*3/uL (ref 0.1–1.0)
Monocytes Relative: 6 %
NEUTROS ABS: 5 10*3/uL (ref 1.7–7.7)
Neutrophils Relative %: 58 %
PLATELETS: 265 10*3/uL (ref 150–400)
RBC: 3.75 MIL/uL — AB (ref 3.87–5.11)
RDW: 14 % (ref 11.5–15.5)
WBC: 8.6 10*3/uL (ref 4.0–10.5)

## 2017-10-12 LAB — URINALYSIS, ROUTINE W REFLEX MICROSCOPIC
Bilirubin Urine: NEGATIVE
Glucose, UA: NEGATIVE mg/dL
Hgb urine dipstick: NEGATIVE
Ketones, ur: NEGATIVE mg/dL
LEUKOCYTES UA: NEGATIVE
Nitrite: NEGATIVE
PROTEIN: NEGATIVE mg/dL
Specific Gravity, Urine: 1.006 (ref 1.005–1.030)
pH: 5 (ref 5.0–8.0)

## 2017-10-12 LAB — LIPASE, BLOOD: LIPASE: 36 U/L (ref 11–51)

## 2017-10-12 MED ORDER — ONDANSETRON HCL 4 MG/2ML IJ SOLN
4.0000 mg | Freq: Once | INTRAMUSCULAR | Status: AC
  Administered 2017-10-12: 4 mg via INTRAVENOUS
  Filled 2017-10-12: qty 2

## 2017-10-12 MED ORDER — AMOXICILLIN-POT CLAVULANATE 875-125 MG PO TABS: 1.0000 | ORAL_TABLET | Freq: Two times a day (BID) | ORAL | 0 refills | Status: AC

## 2017-10-12 MED ORDER — SODIUM CHLORIDE 0.9 % IV SOLN
INTRAVENOUS | Status: DC | PRN
  Administered 2017-10-12: 500 mL via INTRAVENOUS

## 2017-10-12 MED ORDER — SODIUM CHLORIDE 0.9 % IV SOLN
3.0000 g | Freq: Once | INTRAVENOUS | Status: AC
  Administered 2017-10-12: 3 g via INTRAVENOUS
  Filled 2017-10-12: qty 3

## 2017-10-12 MED ORDER — IOPAMIDOL (ISOVUE-300) INJECTION 61%
100.0000 mL | Freq: Once | INTRAVENOUS | Status: AC | PRN
  Administered 2017-10-12 (×2): 100 mL via INTRAVENOUS

## 2017-10-12 MED ORDER — DICYCLOMINE HCL 20 MG PO TABS: 20.0000 mg | ORAL_TABLET | Freq: Three times a day (TID) | ORAL | 0 refills | Status: DC

## 2017-10-12 MED ORDER — SODIUM CHLORIDE 0.9 % IV BOLUS
500.0000 mL | Freq: Once | INTRAVENOUS | Status: AC
  Administered 2017-10-12: 500 mL via INTRAVENOUS

## 2017-10-12 MED ORDER — IOPAMIDOL (ISOVUE-300) INJECTION 61%
INTRAVENOUS | Status: AC
  Administered 2017-10-12: 100 mL via INTRAVENOUS
  Filled 2017-10-12: qty 100

## 2017-10-12 MED ORDER — SODIUM CHLORIDE 0.9 % IV BOLUS: 1000.0000 mL | Freq: Once | INTRAVENOUS | Status: DC

## 2017-10-12 MED ORDER — HYDROMORPHONE HCL 1 MG/ML IJ SOLN
1.0000 mg | Freq: Once | INTRAMUSCULAR | Status: AC
  Administered 2017-10-12: 1 mg via INTRAVENOUS
  Filled 2017-10-12: qty 1

## 2017-10-12 NOTE — ED Provider Notes (Signed)
[facility] DEPT Provider Note   CSN: [id number] Arrival date & time: 10/12/17  0847     History   Chief Complaint Chief Complaint  Patient presents with  . Abdominal Pain    HPI [name] is a 57 y.o. female.  HPI 57 year old female with extensive past medical history as below including history of recurrent diverticulitis here with ongoing left lower quadrant pain.  Patient states that approximately 10 days ago, she was seen for left lower quadrant pain by her GI physician.  She was prescribed Augmentin for 7 days.  She took the Augmentin.  She has had persistent left lower quadrant pain since then.  She denies any worsening but did not have significant improvement on her medications.  Since stopping, she had worsening pain patient describes it as aching, colicky, cramp-like pain in the left lower quadrant with associated nausea and vomiting.  She had normal bowel movements for her, which are loose because she chronically takes MiraLAX.  Denies any blood in her stool.  No fevers or chills.  No urinary symptoms.  No vaginal bleeding or discharge.  No chest pain or shortness of breath.  Past Medical History:  Diagnosis Date  . Acute on chronic diastolic CHF (congestive heart failure) ([place]) 04/11/2014  . Acute suppurative otitis media without spontaneous rupture of eardrum 02/04/2007   Centricity Description: OTITIS MEDIA, SUPPURATIVE, ACUTE, BILATERAL Qualifier: Diagnosis of  By: [name] MD, [name]  Centricity Description: OTITIS MEDIA, SUPPURATIVE, ACUTE Qualifier: Diagnosis of  By: [name] MD, [name]   . Allergic rhinitis   . Allergy   . Anemia   . Anxiety   . Arthritis   . CAD (coronary artery disease)    LHC (11/29/1999):  EF 60%; no significant CAD.  [name] Carpal tunnel syndrome   . Carpal tunnel syndrome 06/09/2006   Qualifier: Diagnosis of  By: Marca Ancona RMA, Lucy    . Chronic diastolic CHF (congestive heart failure) ([place])    a. Echo (01/21/11):  Vigorous LVF, EF 65-70%, no RWMA, Gr 2 DD. b.  Echo (12/15): Moderate LVH, EF 57-84%, grade 2 diastolic dysfunction, mild LAE, normal RV function c. 01/2015: echo showing EF of 60-65% with mild LVH  . Common migraine   . COPD (chronic obstructive pulmonary disease) ([place])   . Diastolic heart failure ([place])   . Diverticulitis   . GERD (gastroesophageal reflux disease)   . Hepatic steatosis   . Hepatomegaly   . Hiatal hernia   . Hyperlipidemia   . Hypertension   . IBS (irritable bowel syndrome)   . Internal hemorrhoids   . Morbid obesity ([place])   . OSA (obstructive sleep apnea)    CPAP dependent  . PNEUMONIA, ORGAN UNSPECIFIED 03/01/2009   Qualifier: Diagnosis of  By: [name] CMA, [name]    . Sinusitis, chronic 03/10/2012   CT sinuses 02/2012:  Chronic sinusitis with small A/F levels >> rx by ENT with prolonged augmentin F/u ct sinuses 04/2012:  Persistent sinus thickening >> rx with levaquin and clinda for 30 days.  Told to return for sinus scan in 6 weeks >> no showed for followup visit.  CT sinuses 11/2013:  Acute and chronic sinusitis noted.    . Sleep apnea    bi pap    Patient Active Problem List   Diagnosis Date Noted  . Polyp of sigmoid colon   . CKD (chronic kidney disease) stage 3, GFR 30-59 ml/min ([facility]) 07/08/2016  . Cough variant asthma 07/31/2015  .  Upper airway cough syndrome 04/11/2015  . Bronchitis, chronic obstructive w acute bronchitis ([place]) 04/11/2015  . Dyspnea   . Acute on chronic diastolic CHF (congestive heart failure), NYHA class 2 ([place]) 03/27/2015  . Morbid obesity due to excess calories ([place]) 03/27/2015  . Dysphagia 03/27/2015  . Hyponatremia 03/27/2015  . Acute kidney injury ([place]) 03/27/2015  . Hypokalemia 03/27/2015  . Leukocytosis 03/27/2015  . COPD exacerbation ([place]) 03/27/2015  . Sinusitis, chronic 03/10/2012  . Hyperlipidemia 10/02/2006  . Obstructive sleep apnea 06/09/2006  . Essential hypertension 06/09/2006    Past Surgical History:    Procedure Laterality Date  . ABDOMINAL HYSTERECTOMY    . CESAREAN SECTION    . COLONOSCOPY    . COLONOSCOPY WITH PROPOFOL N/A 05/05/2017   Procedure: COLONOSCOPY WITH PROPOFOL;  Surgeon: Mauri Pole, MD;  Location: [facility];  Service: Endoscopy;  Laterality: N/A;  . NASAL SINUS SURGERY    . PARTIAL HYSTERECTOMY    . TONSILLECTOMY    . TUBAL LIGATION    . UVULOPALATOPHARYNGOPLASTY       OB History   None      Home Medications    Prior to Admission medications   Medication Sig Start Date End Date Taking? Authorizing Provider  acetaminophen (TYLENOL) 650 MG CR tablet Take 1,300 mg by mouth every 8 (eight) hours as needed for pain.   Yes [provider]  albuterol (PROVENTIL) (2.5 MG/3ML) 0.083% nebulizer solution Take 3 mLs (2.5 mg total) by nebulization every 4 (four) hours as needed for wheezing or shortness of breath. 05/01/17  Yes Tanda Rockers, MD  aspirin 81 MG EC tablet TAKE 1 TABLET BY MOUTH EVERY DAY 06/06/17  Yes Skeet Latch, MD  benzonatate (TESSALON) 200 MG capsule Take 1 capsule (200 mg total) by mouth 3 (three) times daily as needed for cough. 09/03/17  Yes Chesley Mires, MD  carvedilol (COREG) 12.5 MG tablet Take 12.5 mg by mouth 2 (two) times daily with a meal.   Yes [provider]  chlorpheniramine (CHLOR-TRIMETON) 4 MG tablet Take 8 mg by mouth at bedtime. May add 1 extra every 4 hours as needed for drainage, drippy nose, throat clearing   Yes [provider]  Dextromethorphan-Guaifenesin (MUCINEX DM MAXIMUM STRENGTH) 60-1200 MG TB12 Take 1 tablet by mouth 2 (two) times daily as needed (cough).    Yes [provider]  Diclofenac Sodium 3 % GEL Apply 1 application topically 3 (three) times daily. Knee pain   Yes [provider]  diltiazem 2 % GEL Apply 1 application topically 2 (two) times daily as needed (hemorrhoids).   Yes [provider]  famotidine (PEPCID) 20 MG tablet Take 1 tablet (20 mg  total) by mouth daily at 3 pm. 06/12/17  Yes Tanda Rockers, MD  fluticasone [facility]) 50 MCG/ACT nasal spray USE 2 SPRAYS IN EACH NOSTRIL EVERY DAY 08/29/17  Yes Tanda Rockers, MD  gabapentin (NEURONTIN) 100 MG capsule TAKE ONE CAPSULE BY MOUTH THREE TIMES DAILY Patient taking differently: Take 100 mg by mouth 3 (three) times daily as needed (knee pain).  08/18/17  Yes Tanda Rockers, MD  hydrALAZINE (APRESOLINE) 25 MG tablet Take 1 tablet (25 mg total) by mouth 3 (three) times daily. 09/09/17 12/08/17 Yes Skeet Latch, MD  hydroxypropyl methylcellulose / hypromellose (ISOPTO TEARS / GONIOVISC) 2.5 % ophthalmic solution Place 1 drop into both eyes 3 (three) times daily as needed for dry eyes.   Yes [provider]  isosorbide dinitrate (ISORDIL)  20 MG tablet Take 20 mg by mouth 3 (three) times daily.  04/01/17  Yes [provider]  losartan (COZAAR) 50 MG tablet TAKE 0.5 TABLET (50 MG TOTAL) BY MOUTH DAILY. Patient taking differently: Take 50 mg by mouth daily.  05/19/17  Yes Skeet Latch, MD  meloxicam (MOBIC) 15 MG tablet Take 15 mg by mouth daily at 3 pm.  03/31/17  Yes [provider]  metolazone (ZAROXOLYN) 2.5 MG tablet Take 2.5 mg by mouth daily as needed (FOR OVERNIGHT WEIGHT GAIN OF 3 lbs. or more).   Yes [provider]  montelukast (SINGULAIR) 10 MG tablet TAKE 1 TABLET(10 MG) BY MOUTH AT BEDTIME 07/25/17  Yes Tanda Rockers, MD  pantoprazole (PROTONIX) 40 MG tablet TAKE 1 TABLET BY MOUTH DAILY, TAKE 30 TO 60 MINUTES BEFORE FIRST MEAL OF THE DAY Patient taking differently: Take 40 mg by mouth 2 (two) times daily before a meal. TAKE 1 TABLET BY MOUTH DAILY, TAKE 30 TO 60 MINUTES BEFORE FIRST MEAL OF THE DAY 03/25/17  Yes Nandigam, Venia Minks, MD  polyethylene glycol powder (GLYCOLAX/MIRALAX) powder Use 2 capfuls in the morning and 1 capful in the evening and as directed Patient taking differently: Take 17-34 g by mouth every other day. Use 2 capfuls in  the morning and 1 capful in the evening 03/24/17  Yes Nandigam, Venia Minks, MD  potassium chloride SA (K-DUR,KLOR-CON) 20 MEQ tablet TAKE 3 TABLETS (60 MEQ) BY MOUTH THREE TIMES DAILY 09/23/17  Yes Minus Breeding, MD  promethazine (PHENERGAN) 25 MG tablet Take 1 tablet (25 mg total) by mouth every 6 (six) hours as needed for nausea or vomiting. 04/18/17  Yes Lawyer, Harrell Gave, PA-C  Respiratory Therapy Supplies (FLUTTER) [place] 1 application by Does not apply route as directed. 12/27/16  Yes Tanda Rockers, MD  spironolactone (ALDACTONE) 25 MG tablet TAKE 0.5 TABLETS (12.5 MG TOTAL) BY MOUTH 2 (TWO) TIMES DAILY. 10/22/16  Yes Skeet Latch, MD  topiramate (TOPAMAX) 25 MG tablet Take 25 mg by mouth 2 (two) times daily as needed (For migraines.).    Yes [provider]  torsemide (DEMADEX) 20 MG tablet TAKE 2 TABLETS(40 MG) BY MOUTH TWICE DAILY 06/02/17  Yes Skeet Latch, MD  traMADol (ULTRAM) 50 MG tablet Take 1 tablet (50 mg total) by mouth every 6 (six) hours as needed for severe pain. Patient taking differently: Take 100 mg by mouth every 4 (four) hours as needed for severe pain (For pain.).  04/18/17  Yes Lawyer, Harrell Gave, PA-C  UNABLE TO FIND Med Name: CPAP with sleep   Yes [provider]  albuterol (PROVENTIL) (2.5 MG/3ML) 0.083% nebulizer solution TAKE 3 MLS BY NEBULIZATION EVERY 4 HOURS AS NEEDED FOR WHEEZING OR SHORTNESS OF BREATH Patient not taking: Reported on 10/12/2017 08/11/17   Tanda Rockers, MD  amoxicillin-clavulanate (AUGMENTIN) 875-125 MG tablet Take 1 tablet by mouth every 12 (twelve) hours for 7 days. 10/12/17 10/19/17  Duffy Bruce, MD  benzonatate (TESSALON) 200 MG capsule TAKE 1 CAPSULE(200 MG) BY MOUTH THREE TIMES DAILY AS NEEDED FOR COUGH Patient not taking: Reported on 10/12/2017 10/07/17   Chesley Mires, MD  dicyclomine (BENTYL) 20 MG tablet Take 1 tablet (20 mg total) by mouth 4 (four) times daily -  before meals and at bedtime for 7 days. 10/12/17  10/19/17  Duffy Bruce, MD    Family History Family History  Problem Relation Age of Onset  . Heart disease Mother   . Heart attack Mother   .  Hypertension Mother   . Coronary artery disease Unknown        1st degree relatvie <50  . Breast cancer Unknown        aunt- ? paternal or maternal  . Asthma Sister   . Colon polyps Sister   . Stroke Neg Hx   . Colon cancer Neg Hx   . Esophageal cancer Neg Hx   . Rectal cancer Neg Hx   . Stomach cancer Neg Hx     Social History Social History   Tobacco Use  . Smoking status: Never Smoker  . Smokeless tobacco: Never Used  Substance Use Topics  . Alcohol use: No  . Drug use: No     Allergies   Sulfonamide derivatives; Doxycycline; Latex; Ciprofloxacin; Fish allergy; Hydrocortisone; and Neomycin   Review of Systems Review of Systems  Constitutional: Positive for fatigue. Negative for chills and fever.  HENT: Negative for congestion and rhinorrhea.   Eyes: Negative for visual disturbance.  Respiratory: Negative for cough, shortness of breath and wheezing.   Cardiovascular: Negative for chest pain and leg swelling.  Gastrointestinal: Positive for abdominal pain, nausea and vomiting. Negative for diarrhea.  Genitourinary: Negative for dysuria and flank pain.  Musculoskeletal: Negative for neck pain and neck stiffness.  Skin: Negative for rash and wound.  Allergic/Immunologic: Negative for immunocompromised state.  Neurological: Positive for weakness. Negative for syncope and headaches.  All other systems reviewed and are negative.    Physical Exam Updated Vital Signs BP 135/68   Pulse (!) 52   Temp 98.8 F (37.1 C) (Oral)   Resp 16   Ht 5\' 3"  (1.6 m)   Wt 133.8 kg   SpO2 94%   BMI 52.26 kg/m   Physical Exam  Constitutional: She is oriented to person, place, and time. She appears well-developed and well-nourished. No distress.  HENT:  Head: Normocephalic and atraumatic.  Eyes: Conjunctivae are normal.  Neck:  Neck supple.  Cardiovascular: Normal rate, regular rhythm and normal heart sounds. Exam reveals no friction rub.  No murmur heard. Pulmonary/Chest: Effort normal and breath sounds normal. No respiratory distress. She has no wheezes. She has no rales.  Abdominal: Soft. Normal appearance. She exhibits no distension. There is tenderness in the left lower quadrant. There is no rigidity, no rebound and no guarding.  Musculoskeletal: She exhibits no edema.  Neurological: She is alert and oriented to person, place, and time. She exhibits normal muscle tone.  Skin: Skin is warm. Capillary refill takes less than 2 seconds.  Psychiatric: She has a normal mood and affect.  Nursing note and vitals reviewed.    ED Treatments / Results  Labs (all labs ordered are listed, but only abnormal results are displayed) Labs Reviewed  CBC WITH DIFFERENTIAL/PLATELET - Abnormal; Notable for the following components:      Result Value   RBC 3.75 (*)    Hemoglobin 11.2 (*)    HCT 35.0 (*)    All other components within normal limits  COMPREHENSIVE METABOLIC PANEL - Abnormal; Notable for the following components:   Glucose, Bld 114 (*)    Albumin 3.1 (*)    All other components within normal limits  URINALYSIS, ROUTINE W REFLEX MICROSCOPIC - Abnormal; Notable for the following components:   Color, Urine STRAW (*)    All other components within normal limits  LIPASE, BLOOD    EKG None  Radiology Ct Abdomen Pelvis W Contrast  Result Date: 10/12/2017 CLINICAL DATA:  Abdominal pain, nausea/vomiting EXAM: CT  ABDOMEN AND PELVIS WITH CONTRAST TECHNIQUE: Multidetector CT imaging of the abdomen and pelvis was performed using the standard protocol following bolus administration of intravenous contrast. CONTRAST:  137mL ISOVUE-300 IOPAMIDOL (ISOVUE-300) INJECTION 61% COMPARISON:  04/18/2017 FINDINGS: Lower chest: Prominent subpleural fat at the lung bases. Lung bases are clear. Hepatobiliary: Liver is within normal  limits. Gallbladder is unremarkable. No intrahepatic or extrahepatic ductal dilatation. Pancreas: Within normal limits. Spleen: Within normal limits. Adrenals/Urinary Tract: Adrenal glands are within normal limits. Kidneys are within normal limits. No renal, ureteral, or bladder calculi. No hydronephrosis. Stable calcified phlebolith adjacent to the right ureter (series 2/image 46). Bladder is within normal limits. Stomach/Bowel: Stomach is notable for a tiny hiatal hernia. No evidence of bowel obstruction. Appendix is not discretely visualized. No colonic wall thickening or inflammatory changes. Vascular/Lymphatic: No evidence of abdominal aortic aneurysm. No suspicious abdominopelvic lymphadenopathy. Reproductive: Status post hysterectomy. Bilateral ovaries are within normal limits. Other: No abdominopelvic ascites. Small fat containing periumbilical hernia. Musculoskeletal: Degenerative changes of the lower thoracic spine. IMPRESSION: No CT findings to account for the patient's abdominal pain. No evidence of bowel obstruction. Appendix is not discretely visualized. No colonic wall thickening or inflammatory changes. Additional stable ancillary findings as above. Electronically Signed   By: Julian Hy M.D.   On: 10/12/2017 11:14    Procedures Procedures (including critical care time)  Medications Ordered in ED Medications  Ampicillin-Sulbactam (UNASYN) 3 g in sodium chloride 0.9 % 100 mL IVPB (3 g Intravenous New Bag/Given 10/12/17 1205)  0.9 %  sodium chloride infusion (500 mLs Intravenous New Bag/Given 10/12/17 1202)  HYDROmorphone (DILAUDID) injection 1 mg (1 mg Intravenous Given 10/12/17 0942)  ondansetron (ZOFRAN) injection 4 mg (4 mg Intravenous Given 10/12/17 0942)  sodium chloride 0.9 % bolus 500 mL ( Intravenous Stopped 10/12/17 1043)  iopamidol (ISOVUE-300) 61 % injection 100 mL (100 mLs Intravenous Contrast Given 10/12/17 1137)     Initial Impression / Assessment and Plan / ED Course    I have reviewed the triage vital signs and the nursing notes.  Pertinent labs & imaging results that were available during my care of the patient were reviewed by me and considered in my medical decision making (see chart for details).    57 year old female with past medical history as above here with ongoing left lower quadrant pain.  I suspect this is secondary to a likely resolving diverticulitis with subsequent intermittent colonic spasm.  She is afebrile and hemodynamically stable.  Her lab work is very reassuring with normal white count and kidney function.  CT scan shows no acute abnormalities and no evidence of significant bowel obstruction or other complications.  Patient feels better in the ED.  They think it is reasonable for her to continue a course of antibiotics as she is required multiple, longer courses in the past, and will trial Bentyl for her's symptoms.  Will advise probiotics as well.  Final Clinical Impressions(s) / ED Diagnoses   Final diagnoses:  Diverticulitis    ED Discharge Orders         Ordered    amoxicillin-clavulanate (AUGMENTIN) 875-125 MG tablet  Every 12 hours     10/12/17 1210    dicyclomine (BENTYL) 20 MG tablet  3 times daily before meals & bedtime     10/12/17 1210           Duffy Bruce, MD 10/12/17 1210

## 2017-10-12 NOTE — Discharge Instructions (Addendum)
For your Probiotics:  I recommend any over-the-counter probiotic containing Saccharomyces or Acidophilus. Generic probiotics should work just as well. You can also replace this by drinking live culture yogurt, which contains these "good" bacteria.

## 2017-10-12 NOTE — ED Notes (Signed)
ED Provider at bedside. 

## 2017-10-13 ENCOUNTER — Other Ambulatory Visit: Payer: Self-pay | Admitting: Pulmonary Disease

## 2017-10-13 ENCOUNTER — Telehealth: Payer: Self-pay | Admitting: Pulmonary Disease

## 2017-10-13 MED ORDER — BENZONATATE 200 MG PO CAPS: 200.0000 mg | ORAL_CAPSULE | Freq: Three times a day (TID) | ORAL | 0 refills | Status: DC | PRN

## 2017-10-13 NOTE — Telephone Encounter (Signed)
Refill sent and patient aware nothing further need at this time.

## 2017-10-13 NOTE — Telephone Encounter (Signed)
Called and spoke with patient, she stated that she had to take her Benazotate pills back to the pharmacy due to them being stuck together and she could not take them. When asking for a refill the pharmacist said we would need to send in another prescription. Patient stated that the pills were stuck together due to her humidifier causing them to do so. VS please advise are you ok with [place] sending in another prescription? If so please advise on quantity and refill. Thank you.

## 2017-10-13 NOTE — Telephone Encounter (Signed)
Thanks Beth,  I got from the ED note that she hadn't really improved with antibiotics but I guess she is improving now. Still not sure exactly what was causing the pain but diverticulitis is possible. OK, well I was trying to follow up with her. She apparently called office mad that DOD didn't call her back a few days ago. Thanks

## 2017-10-13 NOTE — Telephone Encounter (Signed)
Okay to send new script.

## 2017-10-13 NOTE — Telephone Encounter (Signed)
Beth, please call [place] and get more information. I treated empirically for diverticulitis but recent ED note mentions no improvement but yet given more antibiotics?  Is Bentyl helping? Other symptoms? Thanks

## 2017-10-13 NOTE — Telephone Encounter (Signed)
Called patient this morning regarding request for Augmentin.  Patient was very upset stating that she told the person that she talked with when she called that she was not doing well and to please let the nurse know.  She states no one called her back. Stated she wants to talk to the Administrator.   She thought that the doctor on call after 5 pm would contact her.  I asked if she called after 5 to speak with doctor on call.  She stated no. She thought that the piece of paper would be handed to that doctor if after 5 pm.  I explained to the patient that all I have in my basket regarding her call was a refill request for Augmentin, nothing more.  I apologized and told her that the clinical staff periodically check our baskets throughout the day as we are working with providers.  I apologized that the message was not in detail that I received. She stated that she went to the ER and was seen and given antibiotic by IV.  Also was given Tramadal but that does not help.  She states she cannot take Advil etc.  She would like Nevin Bloodgood to call her.  I told her that I would have Nevin Bloodgood review her ER visit and get back with her.  Forwarded to [place], NP

## 2017-10-13 NOTE — Telephone Encounter (Signed)
Spoke with the patient about her abdominal complaints. She has been to the ER. Please see that note for specific details. The patient states she is better today. She is taking an increased dose of Bentyl four times a day. She does not have pain, fever or constipation.

## 2017-10-15 ENCOUNTER — Other Ambulatory Visit: Payer: Self-pay | Admitting: *Deleted

## 2017-10-15 ENCOUNTER — Other Ambulatory Visit: Payer: Self-pay

## 2017-10-15 MED ORDER — SPIRONOLACTONE 25 MG PO TABS: 12.5000 mg | ORAL_TABLET | Freq: Two times a day (BID) | ORAL | 2 refills | Status: DC

## 2017-10-15 MED ORDER — LOSARTAN POTASSIUM 50 MG PO TABS: ORAL_TABLET | ORAL | 3 refills | Status: DC

## 2017-10-15 MED ORDER — SPIRONOLACTONE 25 MG PO TABS: 12.5000 mg | ORAL_TABLET | Freq: Two times a day (BID) | ORAL | 1 refills | Status: DC

## 2017-10-21 ENCOUNTER — Ambulatory Visit: Payer: 59 | Admitting: Cardiovascular Disease

## 2017-10-25 ENCOUNTER — Other Ambulatory Visit: Payer: Self-pay | Admitting: Internal Medicine

## 2017-11-04 ENCOUNTER — Other Ambulatory Visit: Payer: Self-pay | Admitting: Internal Medicine

## 2017-11-04 NOTE — Telephone Encounter (Signed)
Dr [name]- do you want to keep refilling this for her or does she need an appt with you?

## 2017-11-05 ENCOUNTER — Ambulatory Visit: Payer: 59 | Admitting: Primary Care

## 2017-11-06 ENCOUNTER — Ambulatory Visit: Payer: 59 | Admitting: Cardiovascular Disease

## 2017-11-17 ENCOUNTER — Telehealth: Payer: Self-pay | Admitting: Cardiovascular Disease

## 2017-11-17 MED ORDER — LOSARTAN POTASSIUM 50 MG PO TABS: 50.0000 mg | ORAL_TABLET | Freq: Every day | ORAL | 3 refills | Status: DC

## 2017-11-17 NOTE — Telephone Encounter (Signed)
Spoke with pharmacist  for clarification - directions of  Losartan.  losartan is prescribed 50 mg daily ( one whole tablet)    pharmacist states due to back order  Patient will receive (2) 25 mg tablets to equal 50 mg tablet.  no change with taking medication daily.  Medication list adjusted.

## 2017-11-17 NOTE — Telephone Encounter (Signed)
New Message:     Please call, question about pt's Losartan.

## 2017-11-28 ENCOUNTER — Other Ambulatory Visit: Payer: Self-pay | Admitting: Cardiovascular Disease

## 2017-12-01 NOTE — Telephone Encounter (Signed)
Rx has been sent to the pharmacy electronically. ° °

## 2017-12-02 ENCOUNTER — Other Ambulatory Visit: Payer: Self-pay | Admitting: Cardiovascular Disease

## 2017-12-02 NOTE — Telephone Encounter (Signed)
Rx request sent to pharmacy.  

## 2017-12-12 ENCOUNTER — Telehealth: Payer: Self-pay | Admitting: Pulmonary Disease

## 2017-12-12 NOTE — Telephone Encounter (Signed)
Attempted to call number provided to schedule Patient appointment.  Left message for Patient to call back to schedule appointment.  Will try again at a later time.

## 2017-12-16 ENCOUNTER — Telehealth: Payer: Self-pay | Admitting: Pulmonary Disease

## 2017-12-16 MED ORDER — PREDNISONE 10 MG PO TABS: ORAL_TABLET | ORAL | 0 refills | Status: DC

## 2017-12-16 MED ORDER — AMOXICILLIN-POT CLAVULANATE 875-125 MG PO TABS: 1.0000 | ORAL_TABLET | Freq: Two times a day (BID) | ORAL | 0 refills | Status: DC

## 2017-12-16 NOTE — Telephone Encounter (Signed)
Can give her a script for additional 5 days of augmentin.   Can also give her script for prednisone 10 mg pill >> 3 pills daily for 2 days, 2 pills daily for 2 days, 1 pill daily for 2 days.

## 2017-12-16 NOTE — Telephone Encounter (Signed)
Called and spoke with pt letting her know that VS okayed [place] to send in an Rx for additional augmentin as well as a pred taper. Pt expressed understanding.  I stated to pt after these meds, if she was no better that we would need her to come in for an OV. Pt expressed understanding. Verified pt's preferred pharmacy and sent meds in for pt. Nothing further needed.

## 2017-12-16 NOTE — Telephone Encounter (Signed)
Patient went to her regular pcp she states we did not have any appointments available.They Dx with  URI and the put her on 875 Augmentin Bid but it is not getting any better she has 2 days left on abx.   At this time. Coughing up yellow to green mucus. She is unable to come in for an appointment due to not having a ride.   Dr. [name] please advise.

## 2017-12-19 NOTE — Telephone Encounter (Signed)
Attempted to call pt but line went straight to VM. Left message for pt to return call. 

## 2017-12-25 ENCOUNTER — Encounter

## 2017-12-25 ENCOUNTER — Ambulatory Visit: Payer: 59 | Admitting: Cardiovascular Disease

## 2018-01-07 NOTE — Telephone Encounter (Signed)
Attempted to call pt but unable to reach her. Left message for pt to return call. 

## 2018-01-13 NOTE — Telephone Encounter (Signed)
Pinion, Waldemar Dickens, CMA       12/16/17 12:05 PM  Note    Called and spoke with pt letting her know that VS okayed [place] to send in an Rx for additional augmentin as well as a pred taper. Pt expressed understanding.  I stated to pt after these meds, if she was no better that we would need her to come in for an OV. Pt expressed understanding. Verified pt's preferred pharmacy and sent meds in for pt. Nothing further needed.

## 2018-01-22 ENCOUNTER — Other Ambulatory Visit: Payer: Self-pay

## 2018-01-22 MED ORDER — HYDRALAZINE HCL 25 MG PO TABS: 25.0000 mg | ORAL_TABLET | Freq: Three times a day (TID) | ORAL | 0 refills | Status: DC

## 2018-01-22 MED ORDER — CARVEDILOL 12.5 MG PO TABS: 12.5000 mg | ORAL_TABLET | Freq: Two times a day (BID) | ORAL | 0 refills | Status: DC

## 2018-02-04 ENCOUNTER — Telehealth: Payer: Self-pay | Admitting: Pulmonary Disease

## 2018-02-04 NOTE — Telephone Encounter (Signed)
Called and spoke with patient, she is aware of MW response below and verbalized understanding. Nothing further needed.

## 2018-02-04 NOTE — Telephone Encounter (Signed)
Primary Pulmonologist: Wert Last office visit and with whom: 5.3.19 w/ MW What do we see them for (pulmonary problems): Upper Airway cough syndrome Last OV assessment/plan: Upper airway cough syndrome - Tanda Rockers, MD at 05/25/2017  8:30 AM   Status: Edited  Related Problem: Upper airway cough syndrome    NO  06/01/2015   =  8 - Spirometry 06/01/2015  No obstruction/ poor effort/ reproducibility  Allergy profile 06/01/2015 >  Eos 0.4 /  IgE  35 neg RAST - trial of singulair 06/01/2015 >>> - Sinus CT 06/07/2015 > No definite sinusitis. Only a minimal amount of debris is noted within the right partition of the sphenoid sinus. - Re CT sinus 05/24/2016 > neg > try gabapentin 100 tid (did not purchase)  - added flutter valve 12/27/2016  - 02/07/2017 rechallenge with gabapentin > could not verify 05/23/2017 that she was taking it      Acute flare in setting of reported purulent rhinitis/ ? Sinusitis > rx augmentin and f/u if needed    I had an extended final summary discussion with the patient reviewing all relevant studies completed to date and  lasting 20 minutes of a 25 minute acute office  visit on the following issues:    1) no evidence that she has chronic asthma though certainly at risk as on moderately high doses of coreg.   2) could have sinus infection now so rx with augmentin first step    3)  Med calendar contingencies all reviewed with her line by line    4) if not effective first step is med reconciliation:   To keep things simple, I have asked the patient to first separate medicines that are perceived as maintenance, that is to be taken daily "no matter what", from those medicines that are taken on only on an as-needed basis and I have given the patient examples of both, and then return to see our NP to generate a  detailed  medication calendar which should be followed until the next physician sees the patient and updates it.     She is not interested in #4 but shows very little  insight in medication reconciliation/ accurancy of outpt rx and without this step, given she's on 27 meds at present, there is little I can offer her here in terms of assuring quality medical care and I informed her of this for now the 3rd time.     Since she does not have a pulmonary problem but sees Dr [name] for sleep, I referred her back to him for f/u and will see only in emergency settings unless she's willing to meet me half way as above.        Was appointment offered to patient (explain)?  Patient is out of town in [place] and will not return until the weekend.  Patient is already scheduled for acute visit with [facility] NP on 1.21.2020.   Reason for call: patient reports prod cough with white mucus x3 days with some increased SOB.  Denies any f/c/s, wheezing, chest tightness.  Patient reports similar symptoms in Nov 2019 that VS treated with 10 days of Augmentin (5 days from hospital stay in [place], then additional 5 days by VS) and pred taper by VS.  Patient has been taking Mucinex DM twice daily, Delsym twice daily, chlor-tabs but feels her symptoms are not improving.  Patient is still wearing her CPAP at bedtime but reports still having problems with her mucus gathering in the back of  her throat and cough.  Dr [name], last visit you mentioned patient may follow up only in emergency settings but VS has been answering her sick calls and is unavailable for sick calls per triage protocol.  Please advise if any additional recommendations to hold patient until she can be seen.  Thank you.

## 2018-02-04 NOTE — Telephone Encounter (Signed)
She should not be using delsym and mucinex dm at the same time   If cough so hard she's vomiting will need to go to UC in [place] as can't give her anything for that over the phone

## 2018-02-10 ENCOUNTER — Ambulatory Visit: Payer: 59 | Admitting: Nurse Practitioner

## 2018-02-11 ENCOUNTER — Ambulatory Visit (INDEPENDENT_AMBULATORY_CARE_PROVIDER_SITE_OTHER)
Admission: RE | Admit: 2018-02-11 | Discharge: 2018-02-11 | Disposition: A | Discharge status: Home / Self Care | Payer: 59 | Source: Ambulatory Visit | Attending: Nurse Practitioner | Admitting: Nurse Practitioner

## 2018-02-11 ENCOUNTER — Ambulatory Visit (INDEPENDENT_AMBULATORY_CARE_PROVIDER_SITE_OTHER): Payer: 59 | Admitting: Nurse Practitioner

## 2018-02-11 ENCOUNTER — Encounter: Payer: Self-pay | Admitting: Nurse Practitioner

## 2018-02-11 VITALS — BP 130/70 | HR 84 | Ht 63.0 in | Wt 295.0 lb

## 2018-02-11 DIAGNOSIS — R05 Cough: Secondary | ICD-10-CM

## 2018-02-11 DIAGNOSIS — R059 Cough, unspecified: Secondary | ICD-10-CM

## 2018-02-11 DIAGNOSIS — J40 Bronchitis, not specified as acute or chronic: Secondary | ICD-10-CM | POA: Diagnosis not present

## 2018-02-11 DIAGNOSIS — J45991 Cough variant asthma: Secondary | ICD-10-CM

## 2018-02-11 MED ORDER — AZITHROMYCIN 250 MG PO TABS: ORAL_TABLET | ORAL | 0 refills | Status: DC

## 2018-02-11 MED ORDER — PSEUDOEPH-BROMPHEN-DM 30-2-10 MG/5ML PO SYRP: 5.0000 mL | ORAL_SOLUTION | Freq: Three times a day (TID) | ORAL | 0 refills | Status: AC | PRN

## 2018-02-11 MED ORDER — PREDNISONE 10 MG PO TABS: ORAL_TABLET | ORAL | 0 refills | Status: DC

## 2018-02-11 NOTE — Patient Instructions (Signed)
Will check chest x ray today and call with results Will order azithromycin Will order prednisone taper Will order cough syrup Continue Proventil as needed Use flutter valve 3 times daily  Cough: Continue gastroesophageal reflux disease treatment with elevating the head your bed and taking antacids Continue taking over-the-counter antihistamines and nasal fluticasone to help with allergic rhinitis You need to try to suppress your cough to allow your larynx (voice box) to heal.  For three days don't talk, laugh, sing, or clear your throat. Do everything you can to suppress the cough during this time. Use hard candies (sugarless Jolly Ranchers) or non-mint or non-menthol containing cough drops during this time to soothe your throat.  Use a cough suppressant (Delsym or what I have prescribed you) around the clock during this time.  After three days, gradually increase the use of your voice and back off on the cough suppressants.  Follow up with Dr. [name] in 2 months or sooner if needed

## 2018-02-11 NOTE — Progress Notes (Signed)
@Patient  ID: [name], female    DOB: [birth year], 58 y.o.   MRN: [id number]  No chief complaint on file.   Referring provider: Bartholome Bill, MD  HPI  58 year old female never smoker with upper airway cough syndrome, chronic sinusitis, cough variant asthma, and morbid obesity followed by Dr. [name].  Tests/events:  CXR 03/22/17>> Cardiomegaly with vascular congestion and mild diffuse interstitial edema.  07/31/2015  After extensive coaching HFA effectiveness =    25% > try symbicort 80 2bid and add spacer  - pt off symbicort 05/21/2016 x one month s any discernible airflow obst on spirometry > leave off  Spirometry 06/01/2015  No obstruction/ poor effort/ reproducibility  Allergy profile 06/01/2015 >  Eos 0.4 /  IgE  35 neg RAST - trial of singulair 06/01/2015 >>> - Sinus CT 06/07/2015 > No definite sinusitis. Only a minimal amount of debris is noted within the right partition of the sphenoid sinus. - Re CT sinus 05/24/2016 > neg > try gabapentin 100 tid (did not purchase)  - added flutter valve 12/27/2016  - 02/07/2017 rechallenge with gabapentin > could not verify 05/23/2017 that she was taking it   OV 02/11/18 - cough and chest congestion  Patient presents today for cough and chest congestion.  He states that symptoms started in November after a trip to [place].  She was treated with Carole Civil by PCP for URI on November 20.  She then traveled to [place] for the holidays and states that she was seen in urgent care on December 7 and was prescribed doxycycline and prednisone.  She states that she did improve after taking the doxycycline.  She states that now over the past week her symptoms have returned.  She is now having cough and chest congestion.  Her cough is productive of white sputum.  She denies any recent fevers.  She denies any nasal congestion.  Denies any chest pain or edema.  Vital signs in the office today are stable.  She is compliant with her albuterol nebulizer  treatments.   Allergies  Allergen Reactions  . Sulfonamide Derivatives Anaphylaxis, Swelling, Rash and Other (See Comments)    Swelling tongue and ear   . Doxycycline Nausea And Vomiting  . Latex Hives  . Ciprofloxacin Rash  . Fish Allergy Rash and Other (See Comments)    Only reaction to Mackerel.  No other issues with any type of fish or shellfish currently   . Hydrocortisone Rash  . Neomycin Rash    Immunization History  Administered Date(s) Administered  . Influenza Split 11/21/2013, 11/21/2015, 10/21/2016  . Influenza,inj,Quad PF,6+ Mos 02/23/2015  . Pneumococcal Polysaccharide-23 10/30/2005, 11/22/2015  . Td 08/30/2005    Past Medical History:  Diagnosis Date  . Acute on chronic diastolic CHF (congestive heart failure) ([place]) 04/11/2014  . Acute suppurative otitis media without spontaneous rupture of eardrum 02/04/2007   Centricity Description: OTITIS MEDIA, SUPPURATIVE, ACUTE, BILATERAL Qualifier: Diagnosis of  By: [name] MD, [name]  Centricity Description: OTITIS MEDIA, SUPPURATIVE, ACUTE Qualifier: Diagnosis of  By: [name] MD, [name]   . Allergic rhinitis   . Allergy   . Anemia   . Anxiety   . Arthritis   . CAD (coronary artery disease)    LHC (11/29/1999):  EF 60%; no significant CAD.  [name] Carpal tunnel syndrome   . Carpal tunnel syndrome 06/09/2006   Qualifier: Diagnosis of  By: Marca Ancona RMA, Lucy    . Chronic diastolic CHF (congestive heart failure) ([place])  a. Echo (01/21/11): Vigorous LVF, EF 65-70%, no RWMA, Gr 2 DD. b.  Echo (12/15): Moderate LVH, EF 57-84%, grade 2 diastolic dysfunction, mild LAE, normal RV function c. 01/2015: echo showing EF of 60-65% with mild LVH  . Common migraine   . COPD (chronic obstructive pulmonary disease) ([place])   . Diastolic heart failure ([place])   . Diverticulitis   . GERD (gastroesophageal reflux disease)   . Hepatic steatosis   . Hepatomegaly   . Hiatal hernia   . Hyperlipidemia   . Hypertension   . IBS (irritable bowel  syndrome)   . Internal hemorrhoids   . Morbid obesity ([place])   . OSA (obstructive sleep apnea)    CPAP dependent  . PNEUMONIA, ORGAN UNSPECIFIED 03/01/2009   Qualifier: Diagnosis of  By: [name] CMA, [name]    . Sinusitis, chronic 03/10/2012   CT sinuses 02/2012:  Chronic sinusitis with small A/F levels >> rx by ENT with prolonged augmentin F/u ct sinuses 04/2012:  Persistent sinus thickening >> rx with levaquin and clinda for 30 days.  Told to return for sinus scan in 6 weeks >> no showed for followup visit.  CT sinuses 11/2013:  Acute and chronic sinusitis noted.    . Sleep apnea    bi pap    Tobacco History: Social History   Tobacco Use  Smoking Status Never Smoker  Smokeless Tobacco Never Used   Counseling given: Not Answered   Outpatient Encounter Medications as of 02/11/2018  Medication Sig  . albuterol (PROVENTIL) (2.5 MG/3ML) 0.083% nebulizer solution Take 3 mLs (2.5 mg total) by nebulization every 4 (four) hours as needed for wheezing or shortness of breath.  [name] albuterol (PROVENTIL) (2.5 MG/3ML) 0.083% nebulizer solution TAKE 3 MLS BY NEBULIZATION EVERY 4 HOURS AS NEEDED FOR WHEEZING OR SHORTNESS OF BREATH  . aspirin 81 MG EC tablet TAKE 1 TABLET BY MOUTH EVERY DAY  . benzonatate (TESSALON) 200 MG capsule Take 1 capsule (200 mg total) by mouth 3 (three) times daily as needed for cough.  . carvedilol (COREG) 12.5 MG tablet Take 1 tablet (12.5 mg total) by mouth 2 (two) times daily with a meal.  . chlorpheniramine (CHLOR-TRIMETON) 4 MG tablet Take 8 mg by mouth at bedtime. May add 1 extra every 4 hours as needed for drainage, drippy nose, throat clearing  . Dextromethorphan-Guaifenesin (MUCINEX DM MAXIMUM STRENGTH) 60-1200 MG TB12 Take 1 tablet by mouth 2 (two) times daily as needed (cough).   . Diclofenac Sodium 3 % GEL Apply 1 application topically 3 (three) times daily. Knee pain  . diltiazem 2 % GEL Apply 1 application topically 2 (two) times daily as needed (hemorrhoids).  .  famotidine (PEPCID) 20 MG tablet Take 1 tablet (20 mg total) by mouth daily at 3 pm.  . fluticasone (FLONASE) 50 MCG/ACT nasal spray USE 2 SPRAYS IN EACH NOSTRIL EVERY DAY  . gabapentin (NEURONTIN) 300 MG capsule Take 1 capsule by mouth 3 (three) times daily.  . hydrALAZINE (APRESOLINE) 25 MG tablet Take 1 tablet (25 mg total) by mouth 3 (three) times daily.  . isosorbide dinitrate (ISORDIL) 20 MG tablet Take 20 mg by mouth 3 (three) times daily.   [name] losartan (COZAAR) 50 MG tablet TAKE 1 TABLET BY MOUTH EVERY DAY  . meloxicam (MOBIC) 15 MG tablet Take 15 mg by mouth daily at 3 pm.   . metolazone (ZAROXOLYN) 2.5 MG tablet Take 2.5 mg by mouth daily as needed (FOR OVERNIGHT WEIGHT GAIN OF 3 lbs. or more).  [name]  montelukast (SINGULAIR) 10 MG tablet TAKE 1 TABLET(10 MG) BY MOUTH AT BEDTIME  . pantoprazole (PROTONIX) 40 MG tablet TAKE 1 TABLET BY MOUTH DAILY, TAKE 30 TO 60 MINUTES BEFORE FIRST MEAL OF THE DAY (Patient taking differently: Take 40 mg by mouth 2 (two) times daily before a meal. TAKE 1 TABLET BY MOUTH DAILY, TAKE 30 TO 60 MINUTES BEFORE FIRST MEAL OF THE DAY)  . polyethylene glycol powder (GLYCOLAX/MIRALAX) powder Use 2 capfuls in the morning and 1 capful in the evening and as directed (Patient taking differently: Take 17-34 g by mouth every other day. Use 2 capfuls in the morning and 1 capful in the evening)  . potassium chloride SA (K-DUR,KLOR-CON) 20 MEQ tablet TAKE 3 TABLETS (60 MEQ) BY MOUTH THREE TIMES DAILY  . promethazine (PHENERGAN) 25 MG tablet Take 1 tablet (25 mg total) by mouth every 6 (six) hours as needed for nausea or vomiting.  [name] Respiratory Therapy Supplies (FLUTTER) DEVI 1 application by Does not apply route as directed.  [name] spironolactone (ALDACTONE) 25 MG tablet TAKE 1/2 TABLET BY MOUTH TWICE A DAY  . topiramate (TOPAMAX) 25 MG tablet Take 25 mg by mouth 2 (two) times daily as needed (For migraines.).   [name] torsemide (DEMADEX) 20 MG tablet TAKE 2 TABLETS(40 MG) BY MOUTH TWICE  DAILY  . traMADol (ULTRAM) 50 MG tablet Take 1 tablet (50 mg total) by mouth every 6 (six) hours as needed for severe pain. (Patient taking differently: Take 100 mg by mouth every 4 (four) hours as needed for severe pain (For pain.). )  . UNABLE TO FIND Med Name: CPAP with sleep  . azithromycin (ZITHROMAX) 250 MG tablet Take 2 tablets (500 mg) on day 1, then take 1 tablet (250 mg) on days 2-5  . brompheniramine-pseudoephedrine-DM 30-2-10 MG/5ML syrup Take 5 mLs by mouth 3 (three) times daily as needed for up to 5 days.  [name] dicyclomine (BENTYL) 20 MG tablet Take 1 tablet (20 mg total) by mouth 4 (four) times daily -  before meals and at bedtime for 7 days.  . predniSONE (DELTASONE) 10 MG tablet Take 3 tabs for 2 days, then 2 tabs for 2 days, then 1 tab for 2 days, then stop  . [DISCONTINUED] acetaminophen (TYLENOL) 650 MG CR tablet Take 1,300 mg by mouth every 8 (eight) hours as needed for pain.  . [DISCONTINUED] amoxicillin-clavulanate (AUGMENTIN) 875-125 MG tablet Take 1 tablet by mouth 2 (two) times daily. (Patient not taking: Reported on 02/11/2018)  . [DISCONTINUED] benzonatate (TESSALON) 200 MG capsule TAKE 1 CAPSULE(200 MG) BY MOUTH THREE TIMES DAILY AS NEEDED FOR COUGH (Patient not taking: Reported on 02/11/2018)  . [DISCONTINUED] gabapentin (NEURONTIN) 100 MG capsule TAKE ONE CAPSULE BY MOUTH THREE TIMES DAILY (Patient not taking: Reported on 02/11/2018)  . [DISCONTINUED] hydroxypropyl methylcellulose / hypromellose (ISOPTO TEARS / GONIOVISC) 2.5 % ophthalmic solution Place 1 drop into both eyes 3 (three) times daily as needed for dry eyes.  . [DISCONTINUED] predniSONE (DELTASONE) 10 MG tablet Take 3tabsx2days,2tabsx2days,1tabx2days,thens top (Patient not taking: Reported on 02/11/2018)   No facility-administered encounter medications on file as of 02/11/2018.      Review of Systems  Review of Systems  Constitutional: Negative.  Negative for chills and fever.  HENT: Negative.   Respiratory:  Positive for cough. Negative for shortness of breath.   Cardiovascular: Negative.  Negative for chest pain, palpitations and leg swelling.  Gastrointestinal: Negative.   Allergic/Immunologic: Negative.   Neurological: Negative.   Psychiatric/Behavioral: Negative.  Physical Exam  BP 130/70 (BP Location: Right Arm, Patient Position: Sitting, Cuff Size: Large)   Pulse 84   Ht 5\' 3"  (1.6 m)   Wt 295 lb (133.8 kg)   SpO2 98%   BMI 52.26 kg/m   Wt Readings from Last 5 Encounters:  02/11/18 295 lb (133.8 kg)  10/12/17 295 lb (133.8 kg)  10/02/17 295 lb (133.8 kg)  05/23/17 295 lb (133.8 kg)  05/05/17 295 lb (133.8 kg)     Physical Exam Vitals signs and nursing note reviewed.  Constitutional:      General: She is not in acute distress.    Appearance: She is well-developed.  Cardiovascular:     Rate and Rhythm: Normal rate and regular rhythm.  Pulmonary:     Effort: Pulmonary effort is normal. No respiratory distress.     Breath sounds: Normal breath sounds. No wheezing or rhonchi.  Musculoskeletal:        General: No swelling.  Neurological:     Mental Status: She is alert and oriented to person, place, and time.     Imaging: Dg Chest 2 View  Result Date: 02/11/2018 CLINICAL DATA:  Evaluate for cough. EXAM: CHEST - 2 VIEW COMPARISON:  March 21, 2017 FINDINGS: The heart size and mediastinal contours are within normal limits. Both lungs are clear. The visualized skeletal structures are unremarkable. IMPRESSION: No active cardiopulmonary disease. Electronically Signed   By: Dorise Bullion III M.D   On: 02/11/2018 15:39     Assessment & Plan:   Bronchitis Bronchitis slow to resolve.  Patient Instructions  Will check chest x ray today and call with results Will order azithromycin Will order prednisone taper Will order cough syrup Continue Proventil as needed Use flutter valve 3 times daily  Cough: Continue gastroesophageal reflux disease treatment with  elevating the head your bed and taking antacids Continue taking over-the-counter antihistamines and nasal fluticasone to help with allergic rhinitis You need to try to suppress your cough to allow your larynx (voice box) to heal.  For three days don't talk, laugh, sing, or clear your throat. Do everything you can to suppress the cough during this time. Use hard candies (sugarless Jolly Ranchers) or non-mint or non-menthol containing cough drops during this time to soothe your throat.  Use a cough suppressant (Delsym or what I have prescribed you) around the clock during this time.  After three days, gradually increase the use of your voice and back off on the cough suppressants.  Follow up with Dr. [name] in 2 months or sooner if needed     Cough variant asthma Patient Instructions  Will check chest x ray today and call with results Will order azithromycin Will order prednisone taper Will order cough syrup Continue Proventil as needed Use flutter valve 3 times daily  Cough: Continue gastroesophageal reflux disease treatment with elevating the head your bed and taking antacids Continue taking over-the-counter antihistamines and nasal fluticasone to help with allergic rhinitis You need to try to suppress your cough to allow your larynx (voice box) to heal.  For three days don't talk, laugh, sing, or clear your throat. Do everything you can to suppress the cough during this time. Use hard candies (sugarless Jolly Ranchers) or non-mint or non-menthol containing cough drops during this time to soothe your throat.  Use a cough suppressant (Delsym or what I have prescribed you) around the clock during this time.  After three days, gradually increase the use of your voice and back off  on the cough suppressants.  Follow up with Dr. [name] in 2 months or sooner if needed        Fenton Foy, NP 02/12/2018

## 2018-02-12 ENCOUNTER — Encounter: Payer: Self-pay | Admitting: Nurse Practitioner

## 2018-02-12 DIAGNOSIS — J40 Bronchitis, not specified as acute or chronic: Secondary | ICD-10-CM | POA: Insufficient documentation

## 2018-02-12 NOTE — Progress Notes (Signed)
Chart and office note reviewed in detail  > agree with a/p as outlined    

## 2018-02-12 NOTE — Assessment & Plan Note (Signed)
Bronchitis slow to resolve.  Patient Instructions  Will check chest x ray today and call with results Will order azithromycin Will order prednisone taper Will order cough syrup Continue Proventil as needed Use flutter valve 3 times daily  Cough: Continue gastroesophageal reflux disease treatment with elevating the head your bed and taking antacids Continue taking over-the-counter antihistamines and nasal fluticasone to help with allergic rhinitis You need to try to suppress your cough to allow your larynx (voice box) to heal.  For three days don't talk, laugh, sing, or clear your throat. Do everything you can to suppress the cough during this time. Use hard candies (sugarless Jolly Ranchers) or non-mint or non-menthol containing cough drops during this time to soothe your throat.  Use a cough suppressant (Delsym or what I have prescribed you) around the clock during this time.  After three days, gradually increase the use of your voice and back off on the cough suppressants.  Follow up with Dr. [name] in 2 months or sooner if needed

## 2018-02-12 NOTE — Assessment & Plan Note (Signed)
Patient Instructions  Will check chest x ray today and call with results Will order azithromycin Will order prednisone taper Will order cough syrup Continue Proventil as needed Use flutter valve 3 times daily  Cough: Continue gastroesophageal reflux disease treatment with elevating the head your bed and taking antacids Continue taking over-the-counter antihistamines and nasal fluticasone to help with allergic rhinitis You need to try to suppress your cough to allow your larynx (voice box) to heal.  For three days don't talk, laugh, sing, or clear your throat. Do everything you can to suppress the cough during this time. Use hard candies (sugarless Jolly Ranchers) or non-mint or non-menthol containing cough drops during this time to soothe your throat.  Use a cough suppressant (Delsym or what I have prescribed you) around the clock during this time.  After three days, gradually increase the use of your voice and back off on the cough suppressants.  Follow up with Dr. [name] in 2 months or sooner if needed

## 2018-02-14 ENCOUNTER — Other Ambulatory Visit: Payer: Self-pay | Admitting: Cardiovascular Disease

## 2018-02-18 ENCOUNTER — Ambulatory Visit (INDEPENDENT_AMBULATORY_CARE_PROVIDER_SITE_OTHER): Payer: 59 | Admitting: Cardiovascular Disease

## 2018-02-18 ENCOUNTER — Encounter: Payer: Self-pay | Admitting: Cardiovascular Disease

## 2018-02-18 VITALS — BP 124/86 | HR 67 | Ht 63.0 in | Wt 293.0 lb

## 2018-02-18 DIAGNOSIS — I5032 Chronic diastolic (congestive) heart failure: Secondary | ICD-10-CM

## 2018-02-18 DIAGNOSIS — G4733 Obstructive sleep apnea (adult) (pediatric): Secondary | ICD-10-CM

## 2018-02-18 DIAGNOSIS — R0609 Other forms of dyspnea: Secondary | ICD-10-CM

## 2018-02-18 DIAGNOSIS — R000 Tachycardia, unspecified: Secondary | ICD-10-CM | POA: Diagnosis not present

## 2018-02-18 DIAGNOSIS — I4711 Inappropriate sinus tachycardia, so stated: Secondary | ICD-10-CM

## 2018-02-18 DIAGNOSIS — I10 Essential (primary) hypertension: Secondary | ICD-10-CM

## 2018-02-18 NOTE — Addendum Note (Signed)
Addended by: Alvina Filbert B on: 02/18/2018 01:45 PM   Modules accepted: Orders
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
## 2018-02-18 NOTE — Patient Instructions (Addendum)
Medication Instructions:  DECREASE YOUR HYDRALAZINE TO 25 MG 1/2 TABLET 3 TIMES A DAY   If you need a refill on your cardiac medications before your next appointment, please call your pharmacy.   Lab work: NONE  Testing/Procedures: NONE  Follow-Up: At [facility], you and your health needs are our priority.  As part of our continuing mission to provide you with exceptional heart care, we have created designated Provider Care Teams.  These Care Teams include your primary Cardiologist (physician) and Advanced Practice Providers (APPs -  Physician Assistants and Nurse Practitioners) who all work together to provide you with the care you need, when you need it. You will need a follow up appointment in 12 months.  Please call our office 2 months in advance to schedule this appointment.  You may see DR [facility]  or one of the following Advanced Practice Providers on your designated Care Team:   Kerin Ransom, PA-C Roby Lofts, [place] . Sande Rives, PA-C  Your physician recommends that you schedule a follow-up appointment in: PHARM D 1 MONTH FOR BLOOD PRESSURE

## 2018-02-20 ENCOUNTER — Other Ambulatory Visit: Payer: Self-pay | Admitting: Cardiovascular Disease

## 2018-02-20 NOTE — Telephone Encounter (Signed)
Rx(s) sent to pharmacy electronically.  

## 2018-03-11 ENCOUNTER — Encounter: Payer: Self-pay | Admitting: Internal Medicine

## 2018-03-11 ENCOUNTER — Ambulatory Visit (INDEPENDENT_AMBULATORY_CARE_PROVIDER_SITE_OTHER): Payer: 59 | Admitting: Internal Medicine

## 2018-03-11 VITALS — BP 130/84 | HR 80 | Temp 98.0°F | Ht 63.0 in | Wt 295.0 lb

## 2018-03-11 DIAGNOSIS — J45991 Cough variant asthma: Secondary | ICD-10-CM | POA: Diagnosis not present

## 2018-03-11 DIAGNOSIS — R058 Other specified cough: Secondary | ICD-10-CM

## 2018-03-11 DIAGNOSIS — R05 Cough: Secondary | ICD-10-CM

## 2018-03-11 MED ORDER — DOXYCYCLINE HYCLATE 100 MG PO TABS: 100.0000 mg | ORAL_TABLET | Freq: Two times a day (BID) | ORAL | 11 refills | Status: DC

## 2018-03-11 NOTE — Patient Instructions (Addendum)
Pantoprazole (protonix) 40 mg   Take  30-60 min before first meal of the day and Pepcid (famotidine)  20 mg one after supper until return to office - this is the best way to tell whether stomach acid is contributing to your problem.    Strongly recommend 6-8 in bed blocks under head   Doxycline 100 mg twice daily x 10 days as needed for nasty mucus    For cough > mucinex dm up to 1200 mg every 12 hours and the flutter valve   Ok to try nebulizer 15 min before you go shopping to to see if makes your breathing   Please schedule a follow up office visit in 6 weeks, call sooner if needed with all medications /inhalers/ solutions in hand so we can verify exactly what you are taking. This includes all medications from all doctors and over the [place] separate them into two bags:  the ones you take automatically, no matter what, vs the ones you take just when you feel you need them "BAG #2 is UP TO YOU"  - this will really help [place] help you take your medications more effectively - pfts on return without your nebulizer that day
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
## 2018-03-12 ENCOUNTER — Encounter: Payer: Self-pay | Admitting: Internal Medicine

## 2018-03-12 NOTE — Assessment & Plan Note (Addendum)
Onset in her 53s trigger by URI's sev times a year NO  06/01/2015   =  8 - Spirometry 06/01/2015  No obstruction/ poor effort/ reproducibility  Allergy profile 06/01/2015 >  Eos 0.4 /  IgE  35 neg RAST - trial of singulair 06/01/2015 >>> - Sinus CT 06/07/2015 > No definite sinusitis. Only a minimal amount of debris is noted within the right partition of the sphenoid sinus. - Re CT sinus 05/24/2016 > neg > try gabapentin 100 tid (did not purchase)  - added flutter valve 12/27/2016  - 02/07/2017 rechallenge with gabapentin > could not verify 05/23/2017 that she was taking it    Says seen by ID specialist who said "you have copd and they should have you on doxycycline"  - unable to give me name or location where this opinion was rendered but she's convinced doxy works better than anything else for her flares so it is fine with me if she uses a refillable rx for doxy but also remember  1) max rx for gerd for now including bed blocks/reviewed 2) mucinex dm 1200 mg bid for cough or delsym but not both and use the flutter as much as possible 3) return in 6 weeks for med reconciliation.  To keep things simple, I have asked the patient to first separate medicines that are perceived as maintenance, that is to be taken daily "no matter what", from those medicines that are taken on only on an as-needed basis and I have given the patient examples of both, and then return to see our NP to generate a  detailed  medication calendar which should be followed until the next physician sees the patient and updates it. If not able to do this then I informed her again today I will ask to [facility] or another provider to take over her pulmonary care.

## 2018-03-12 NOTE — Assessment & Plan Note (Addendum)
Onset ? Her 40's 07/31/2015  After extensive coaching HFA effectiveness =    25% > try symbicort 80 2bid and add spacer  - pt off symbicort 05/21/2016 x one month s any discernible airflow obst on spirometry > leave off  - FENO 03/11/2018  =   Could not perform - Spirometry 03/11/2018  FEV1 1.9 (92%)  Ratio 0.93 with nonphysiologic f/v during flare of cough on maint singulair and coreg 12.5 mg bid    Unable to demonstrate airflow obst during "one of her spells" of cough despite reported chronic doe x 100 ft that improves when she sits down and uses saba which proves nothing.  No evidence coreg is causing airflow obst so probably ok to continue in low doses.    I spent extra time with pt today reviewing appropriate use of albuterol for prn use on exertion with the following points: 1) saba is for relief of sob that does not improve by walking a slower pace or resting but rather if the pt does not improve after trying this first. 2) If the pt is convinced, as many are, that saba helps recover from activity faster then it's easy to tell if this is the case by re-challenging : ie stop, take the inhaler, then p 5 minutes try the exact same activity (intensity of workload) that just caused the symptoms and see if they are substantially diminished or not after saba 3) if there is an activity that reproducibly causes the symptoms, try the saba 15 min before the activity on alternate days   If in fact the saba really does help, then fine to continue to use it prn but advised may need to look closer at the maintenance regimen being used to achieve better control of airways disease with exertion.

## 2018-03-12 NOTE — Assessment & Plan Note (Signed)
Body mass index is 52.26 kg/m.  -  trending down but a long way to go Lab Results  Component Value Date   TSH 1.05 05/01/2016     Contributing to gerd risk/ doe/reviewed the need and the process to achieve and maintain neg calorie balance > defer f/u primary care including intermittently monitoring thyroid status      I had an extended discussion with the patient reviewing all relevant studies completed to date and  lasting 25 minutes of a 40  minute acute office  visit addressing  severe non-specific but potentially very serious refractory respiratory symptoms of uncertain and potentially multiple  etiologies.   Each maintenance medication was reviewed in detail including most importantly the difference between maintenance and prns and under what circumstances the prns are to be triggered using an action plan format that is not reflected in the computer generated alphabetically organized AVS.    Please see AVS for specific instructions unique to this office visit that I personally wrote and verbalized to the the pt in detail and then reviewed with pt  by my nurse highlighting any changes in therapy/plan of care  recommended at today's visit.

## 2018-03-19 ENCOUNTER — Emergency Department (HOSPITAL_COMMUNITY): Payer: 59

## 2018-03-19 ENCOUNTER — Telehealth: Payer: Self-pay | Admitting: Cardiovascular Disease

## 2018-03-19 ENCOUNTER — Telehealth: Payer: Self-pay | Admitting: Gastroenterology

## 2018-03-19 ENCOUNTER — Emergency Department (HOSPITAL_COMMUNITY)
Admission: EM | Admit: 2018-03-19 | Discharge: 2018-03-19 | Disposition: A | Discharge status: Home / Self Care | Payer: 59 | Attending: Emergency Medicine | Admitting: Emergency Medicine

## 2018-03-19 ENCOUNTER — Encounter (HOSPITAL_COMMUNITY): Payer: Self-pay | Admitting: Emergency Medicine

## 2018-03-19 ENCOUNTER — Ambulatory Visit: Payer: 59

## 2018-03-19 DIAGNOSIS — I5033 Acute on chronic diastolic (congestive) heart failure: Secondary | ICD-10-CM | POA: Diagnosis not present

## 2018-03-19 DIAGNOSIS — I130 Hypertensive heart and chronic kidney disease with heart failure and stage 1 through stage 4 chronic kidney disease, or unspecified chronic kidney disease: Secondary | ICD-10-CM | POA: Diagnosis not present

## 2018-03-19 DIAGNOSIS — J449 Chronic obstructive pulmonary disease, unspecified: Secondary | ICD-10-CM | POA: Insufficient documentation

## 2018-03-19 DIAGNOSIS — R0602 Shortness of breath: Secondary | ICD-10-CM | POA: Diagnosis not present

## 2018-03-19 DIAGNOSIS — Z7982 Long term (current) use of aspirin: Secondary | ICD-10-CM | POA: Diagnosis not present

## 2018-03-19 DIAGNOSIS — Z91040 Latex allergy status: Secondary | ICD-10-CM | POA: Diagnosis not present

## 2018-03-19 DIAGNOSIS — I2510 Atherosclerotic heart disease of native coronary artery without angina pectoris: Secondary | ICD-10-CM | POA: Insufficient documentation

## 2018-03-19 DIAGNOSIS — Z79899 Other long term (current) drug therapy: Secondary | ICD-10-CM | POA: Insufficient documentation

## 2018-03-19 DIAGNOSIS — N183 Chronic kidney disease, stage 3 (moderate): Secondary | ICD-10-CM | POA: Insufficient documentation

## 2018-03-19 LAB — I-STAT TROPONIN, ED: Troponin i, poc: 0.04 ng/mL (ref 0.00–0.08)

## 2018-03-19 LAB — CBC WITH DIFFERENTIAL/PLATELET
Abs Immature Granulocytes: 0.13 10*3/uL — ABNORMAL HIGH (ref 0.00–0.07)
Basophils Absolute: 0.1 10*3/uL (ref 0.0–0.1)
Basophils Relative: 0 %
Eosinophils Absolute: 0.1 10*3/uL (ref 0.0–0.5)
Eosinophils Relative: 1 %
HCT: 35.8 % — ABNORMAL LOW (ref 36.0–46.0)
Hemoglobin: 10.9 g/dL — ABNORMAL LOW (ref 12.0–15.0)
Immature Granulocytes: 1 %
Lymphocytes Relative: 29 %
Lymphs Abs: 3.6 10*3/uL (ref 0.7–4.0)
MCH: 29.6 pg (ref 26.0–34.0)
MCHC: 30.4 g/dL (ref 30.0–36.0)
MCV: 97.3 fL (ref 80.0–100.0)
Monocytes Absolute: 0.8 10*3/uL (ref 0.1–1.0)
Monocytes Relative: 6 %
Neutro Abs: 7.8 10*3/uL — ABNORMAL HIGH (ref 1.7–7.7)
Neutrophils Relative %: 63 %
Platelets: 263 10*3/uL (ref 150–400)
RBC: 3.68 MIL/uL — ABNORMAL LOW (ref 3.87–5.11)
RDW: 13.5 % (ref 11.5–15.5)
WBC: 12.5 10*3/uL — ABNORMAL HIGH (ref 4.0–10.5)
nRBC: 0 % (ref 0.0–0.2)

## 2018-03-19 LAB — BASIC METABOLIC PANEL
Anion gap: 9 (ref 5–15)
BUN: 22 mg/dL — AB (ref 6–20)
CO2: 23 mmol/L (ref 22–32)
CREATININE: 1.24 mg/dL — AB (ref 0.44–1.00)
Calcium: 9.2 mg/dL (ref 8.9–10.3)
Chloride: 105 mmol/L (ref 98–111)
GFR calc Af Amer: 55 mL/min — ABNORMAL LOW (ref >60)
GFR calc non Af Amer: 48 mL/min — ABNORMAL LOW (ref >60)
Glucose, Bld: 140 mg/dL — ABNORMAL HIGH (ref 70–99)
Potassium: 4.4 mmol/L (ref 3.5–5.1)
Sodium: 137 mmol/L (ref 135–145)

## 2018-03-19 LAB — BRAIN NATRIURETIC PEPTIDE: B Natriuretic Peptide: 35.7 pg/mL (ref 0.0–100.0)

## 2018-03-19 MED ORDER — IPRATROPIUM-ALBUTEROL 0.5-2.5 (3) MG/3ML IN SOLN
3.0000 mL | Freq: Once | RESPIRATORY_TRACT | Status: AC
  Administered 2018-03-19: 3 mL via RESPIRATORY_TRACT
  Filled 2018-03-19: qty 3

## 2018-03-19 NOTE — Telephone Encounter (Signed)
Pt called states she is having very bad pain on her lower right abd also feels like her stomach is swollen. Wants an apt but Dr. Silverio Decamp is complete booked would like to speak to a nurse.

## 2018-03-19 NOTE — ED Notes (Signed)
Patient ambulated to the bathroom with minimal assistance. O2 sats dropped to 96% on RA with ambulation. MD made aware.

## 2018-03-19 NOTE — Telephone Encounter (Signed)
New Message         Patient is calling in today because she is no better, she went to the ED this AM @ 1:30, patient is complaining of a full stomach, pls advise.   Pt c/o Shortness Of Breath: STAT if SOB developed within the last 24 hours or pt is noticeably SOB on the phone   1. Are you currently SOB (can you hear that pt is SOB on the phone)? Yes  2. How long have you been experiencing SOB? Every since yesterday  3. Are you SOB when sitting or when up moving around? Both  4. Are you currently experiencing any other symptoms? No, patient has not being sleep since yesterday.

## 2018-03-19 NOTE — Telephone Encounter (Signed)
Spoke with the patient. She has had abdominal pain for over a week. Seen by pulmonology, seen in the ER and has called her cardiologist. She is on Miralax PPI and H2 blocker. Her abdomen feels swollen and uncomfortable. Agrees to an appointment tomorrow with APP.

## 2018-03-19 NOTE — ED Provider Notes (Signed)
[facility] EMERGENCY DEPARTMENT Provider Note   CSN: [id number] Arrival date & time: 03/19/18  0116    History   Chief Complaint Chief Complaint  Patient presents with  . Shortness of Breath    HPI [name] is a 58 y.o. female.     Patient with past medical history remarkable for CHF, COPD, and CAD, presents to the emergency department with a chief complaint of shortness of breath.  She states that she has had gradually worsening symptoms for the past few days.  She states that tonight while trying to sleep she had acutely worsening shortness of breath.  She states that she did have some frothy sputum.  She denies any fevers or chills.  Denies any chest pain or abdominal pain.  Denies any lower extremity swelling.  She takes a fluid pill.  Denies any other associated symptoms.  The history is provided by the patient. No language interpreter was used.    Past Medical History:  Diagnosis Date  . Acute on chronic diastolic CHF (congestive heart failure) ([place]) 04/11/2014  . Acute suppurative otitis media without spontaneous rupture of eardrum 02/04/2007   Centricity Description: OTITIS MEDIA, SUPPURATIVE, ACUTE, BILATERAL Qualifier: Diagnosis of  By: [name] MD, [name]  Centricity Description: OTITIS MEDIA, SUPPURATIVE, ACUTE Qualifier: Diagnosis of  By: [name] MD, [name]   . Allergic rhinitis   . Allergy   . Anemia   . Anxiety   . Arthritis   . CAD (coronary artery disease)    LHC (11/29/1999):  EF 60%; no significant CAD.  [name] Carpal tunnel syndrome   . Carpal tunnel syndrome 06/09/2006   Qualifier: Diagnosis of  By: Marca Ancona RMA, Lucy    . Chronic diastolic CHF (congestive heart failure) ([place])    a. Echo (01/21/11): Vigorous LVF, EF 65-70%, no RWMA, Gr 2 DD. b.  Echo (12/15): Moderate LVH, EF 41-96%, grade 2 diastolic dysfunction, mild LAE, normal RV function c. 01/2015: echo showing EF of 60-65% with mild LVH  . Common migraine   . COPD (chronic obstructive  pulmonary disease) ([place])   . Diastolic heart failure ([place])   . Diverticulitis   . GERD (gastroesophageal reflux disease)   . Hepatic steatosis   . Hepatomegaly   . Hiatal hernia   . Hyperlipidemia   . Hypertension   . IBS (irritable bowel syndrome)   . Internal hemorrhoids   . Morbid obesity ([place])   . OSA (obstructive sleep apnea)    CPAP dependent  . PNEUMONIA, ORGAN UNSPECIFIED 03/01/2009   Qualifier: Diagnosis of  By: [name] CMA, [name]    . Sinusitis, chronic 03/10/2012   CT sinuses 02/2012:  Chronic sinusitis with small A/F levels >> rx by ENT with prolonged augmentin F/u ct sinuses 04/2012:  Persistent sinus thickening >> rx with levaquin and clinda for 30 days.  Told to return for sinus scan in 6 weeks >> no showed for followup visit.  CT sinuses 11/2013:  Acute and chronic sinusitis noted.    . Sleep apnea    bi pap    Patient Active Problem List   Diagnosis Date Noted  . Bronchitis 02/12/2018  . Polyp of sigmoid colon   . CKD (chronic kidney disease) stage 3, GFR 30-59 ml/min ([facility]) 07/08/2016  . Cough variant asthma 07/31/2015  . Upper airway cough syndrome 04/11/2015  . Bronchitis, chronic obstructive w acute bronchitis ([place]) 04/11/2015  . Dyspnea   . Acute on chronic diastolic CHF (congestive heart failure), NYHA  class 2 ([place]) 03/27/2015  . Morbid obesity due to excess calories ([place]) 03/27/2015  . Dysphagia 03/27/2015  . Hyponatremia 03/27/2015  . Acute kidney injury ([place]) 03/27/2015  . Hypokalemia 03/27/2015  . Leukocytosis 03/27/2015  . COPD exacerbation ([place]) 03/27/2015  . Sinusitis, chronic 03/10/2012  . Hyperlipidemia 10/02/2006  . Obstructive sleep apnea 06/09/2006  . Essential hypertension 06/09/2006    Past Surgical History:  Procedure Laterality Date  . ABDOMINAL HYSTERECTOMY    . CESAREAN SECTION    . COLONOSCOPY    . COLONOSCOPY WITH PROPOFOL N/A 05/05/2017   Procedure: COLONOSCOPY WITH PROPOFOL;  Surgeon: Mauri Pole, MD;  Location: [facility]  ENDOSCOPY;  Service: Endoscopy;  Laterality: N/A;  . NASAL SINUS SURGERY    . PARTIAL HYSTERECTOMY    . TONSILLECTOMY    . TUBAL LIGATION    . UVULOPALATOPHARYNGOPLASTY       OB History   No obstetric history on file.      Home Medications    Prior to Admission medications   Medication Sig Start Date End Date Taking? Authorizing Provider  albuterol (PROVENTIL) (2.5 MG/3ML) 0.083% nebulizer solution Take 3 mLs (2.5 mg total) by nebulization every 4 (four) hours as needed for wheezing or shortness of breath. 05/01/17   Tanda Rockers, MD  aspirin 81 MG EC tablet TAKE 1 TABLET BY MOUTH EVERY DAY 06/06/17   Skeet Latch, MD  benzonatate (TESSALON) 200 MG capsule Take 1 capsule (200 mg total) by mouth 3 (three) times daily as needed for cough. 10/13/17   Chesley Mires, MD  carvedilol (COREG) 12.5 MG tablet Take 1 tablet (12.5 mg total) by mouth 2 (two) times daily with a meal. 01/22/18   Skeet Latch, MD  chlorpheniramine (CHLOR-TRIMETON) 4 MG tablet Take 8 mg by mouth at bedtime. May add 1 extra every 4 hours as needed for drainage, drippy nose, throat clearing    [provider]  Dextromethorphan-Guaifenesin (MUCINEX DM MAXIMUM STRENGTH) 60-1200 MG TB12 Take 1 tablet by mouth 2 (two) times daily as needed (cough).     [provider]  doxycycline (VIBRA-TABS) 100 MG tablet Take 1 tablet (100 mg total) by mouth 2 (two) times daily. 03/11/18   Tanda Rockers, MD  famotidine (PEPCID) 20 MG tablet Take 1 tablet (20 mg total) by mouth daily at 3 pm. 06/12/17   Tanda Rockers, MD  fluticasone [facility]) 50 MCG/ACT nasal spray USE 2 SPRAYS IN EACH NOSTRIL EVERY DAY 10/27/17   Chesley Mires, MD  hydrALAZINE (APRESOLINE) 25 MG tablet Take 12.5 mg by mouth 3 (three) times daily.    [provider]  isosorbide dinitrate (ISORDIL) 20 MG tablet Take 1 tablet (20 mg total) by mouth 3 (three) times daily. 02/20/18   Skeet Latch, MD  losartan (COZAAR) 50 MG tablet TAKE 1  TABLET BY MOUTH EVERY DAY 12/01/17   Skeet Latch, MD  meloxicam (MOBIC) 15 MG tablet Take 15 mg by mouth daily at 3 pm.  03/31/17   [provider]  metolazone (ZAROXOLYN) 2.5 MG tablet Take 2.5 mg by mouth daily as needed (FOR OVERNIGHT WEIGHT GAIN OF 3 lbs. or more).    [provider]  montelukast (SINGULAIR) 10 MG tablet TAKE 1 TABLET(10 MG) BY MOUTH AT BEDTIME 07/25/17   Tanda Rockers, MD  pantoprazole (PROTONIX) 40 MG tablet TAKE 1 TABLET BY MOUTH DAILY, TAKE 30 TO 60 MINUTES BEFORE FIRST MEAL OF THE DAY Patient taking differently: Take 40 mg by mouth 2 (two)  times daily before a meal. TAKE 1 TABLET BY MOUTH DAILY, TAKE 30 TO 60 MINUTES BEFORE FIRST MEAL OF THE DAY 03/25/17   Mauri Pole, MD  polyethylene glycol powder (GLYCOLAX/MIRALAX) powder Use 2 capfuls in the morning and 1 capful in the evening and as directed Patient taking differently: Take 17-34 g by mouth every other day. Use 2 capfuls in the morning and 1 capful in the evening 03/24/17   Nandigam, Karleen Hampshire V, MD  potassium chloride SA (K-DUR,KLOR-CON) 20 MEQ tablet TAKE 3 TABLETS (60 MEQ) BY MOUTH THREE TIMES DAILY 09/23/17   Minus Breeding, MD  promethazine (PHENERGAN) 25 MG tablet Take 1 tablet (25 mg total) by mouth every 6 (six) hours as needed for nausea or vomiting. 04/18/17   Dalia Heading, PA-C  Respiratory Therapy Supplies (FLUTTER) DEVI 1 application by Does not apply route as directed. 12/27/16   Tanda Rockers, MD  spironolactone (ALDACTONE) 25 MG tablet TAKE 1/2 TABLET BY MOUTH TWICE A DAY 12/02/17   Skeet Latch, MD  topiramate (TOPAMAX) 25 MG tablet Take 25 mg by mouth 2 (two) times daily as needed (For migraines.).     [provider]  torsemide (DEMADEX) 20 MG tablet TAKE 2 TABLETS(40 MG) BY MOUTH TWICE DAILY 06/02/17   Skeet Latch, MD  traMADol (ULTRAM) 50 MG tablet Take 1 tablet (50 mg total) by mouth every 6 (six) hours as needed for severe pain. Patient taking  differently: Take 100 mg by mouth every 4 (four) hours as needed for severe pain (For pain.).  04/18/17   Lawyer, Harrell Gave, PA-C  UNABLE TO FIND Med Name: CPAP with sleep    [provider]    Family History Family History  Problem Relation Age of Onset  . Heart disease Mother   . Heart attack Mother   . Hypertension Mother   . Coronary artery disease Other        1st degree relatvie <50  . Breast cancer Other        aunt- ? paternal or maternal  . Asthma Sister   . Colon polyps Sister   . Stroke Neg Hx   . Colon cancer Neg Hx   . Esophageal cancer Neg Hx   . Rectal cancer Neg Hx   . Stomach cancer Neg Hx     Social History Social History   Tobacco Use  . Smoking status: Never Smoker  . Smokeless tobacco: Never Used  Substance Use Topics  . Alcohol use: No  . Drug use: No     Allergies   Sulfonamide derivatives; Doxycycline; Latex; Ciprofloxacin; Fish allergy; Hydrocortisone; and Neomycin   Review of Systems Review of Systems  All other systems reviewed and are negative.    Physical Exam Updated Vital Signs BP (!) 153/79 (BP Location: Right Arm)   Pulse (!) 56   Temp 98.4 F (36.9 C) (Oral)   Resp 20   Ht 5\' 3"  (1.6 m)   Wt 131.5 kg   SpO2 97%   BMI 51.37 kg/m   Physical Exam Vitals signs and nursing note reviewed.  Constitutional:      Appearance: She is well-developed.  HENT:     Head: Normocephalic and atraumatic.  Eyes:     Conjunctiva/sclera: Conjunctivae normal.     Pupils: Pupils are equal, round, and reactive to light.  Neck:     Musculoskeletal: Normal range of motion and neck supple.  Cardiovascular:     Rate and Rhythm: Normal rate and regular rhythm.  Heart sounds: No murmur. No friction rub. No gallop.   Pulmonary:     Effort: Pulmonary effort is normal. No respiratory distress.     Breath sounds: Normal breath sounds. No wheezing or rales.  Chest:     Chest wall: No tenderness.  Abdominal:     General: Bowel  sounds are normal. There is no distension.     Palpations: Abdomen is soft. There is no mass.     Tenderness: There is no abdominal tenderness. There is no guarding or rebound.  Musculoskeletal: Normal range of motion.        General: No tenderness.  Skin:    General: Skin is warm and dry.  Neurological:     Mental Status: She is alert and oriented to person, place, and time.  Psychiatric:        Behavior: Behavior normal.        Thought Content: Thought content normal.        Judgment: Judgment normal.      ED Treatments / Results  Labs (all labs ordered are listed, but only abnormal results are displayed) Labs Reviewed  CBC WITH DIFFERENTIAL/PLATELET  BASIC METABOLIC PANEL  BRAIN NATRIURETIC PEPTIDE  I-STAT TROPONIN, ED    EKG None  Radiology No results found.  Procedures Procedures (including critical care time)  Medications Ordered in ED Medications - No data to display   Initial Impression / Assessment and Plan / ED Course  I have reviewed the triage vital signs and the nursing notes.  Pertinent labs & imaging results that were available during my care of the patient were reviewed by me and considered in my medical decision making (see chart for details).        She with shortness of breath.  States that it is worse when she is wearing her new CPAP mask.  Also felt some chest heaviness tonight.  She does have a history of CHF and COPD.  She has no wheezing on exam.  She has trace pitting edema in bilateral lower extremities, but does not sound wet.  Her vital signs have been stable.  Chest x-ray is clear.  BNP is normal.  Troponin is negative.  No ischemic changes on EKG.  Doubt ACS, doubt PE, patient is not tachycardic or hypoxic.  Patient ambulates maintaining 96% pulse oxygenation on room air.  Patient seen by discussed with Dr. Leonette Monarch, who agrees with plan for discharge.  Patient states that the symptoms mainly occur when she is wearing her CPAP mask, will  have her talk with her device rep and see about fit or switching out the mask.  No further emergent work-up indicated.  Patient is stable ready for discharge.  Final Clinical Impressions(s) / ED Diagnoses   Final diagnoses:  SOB (shortness of breath)    ED Discharge Orders    None       Montine Circle, PA-C 03/19/18 0538    Fatima Blank, MD 03/19/18 [phone]

## 2018-03-19 NOTE — Telephone Encounter (Signed)
Triage call received from the operator. Spoke with the patient who sts that she was seen in th ED today for SOB and and a feeling of tightness in the abdomen. She says that the did do lab work and a chest xray and the discharged her home.  She denies weight gain and LE edema. She reports orthopnea when she tries to lay flat. Pt BNP drawn in the ED was normal and her chest xray showed that her lungs were clear. She is taking her medications as prescribed including her diuretics, she reports good urine output. She is unable to provide daily weights, but doesn't feel as though she has gained much.  The PA in the ED felt that her symptoms are more likely related to her sleep apnea treatment and advised her to f/u with the provider who treats her sleep apnea.  Adv pt that I did not feel that her tightness in the abdomen was necessarily related to fluid retention. Asked if she has had any changes in her bowels. She denied any changes but she does sts  that she sees a GI physician for IBS. Adv her that the bloating in her abdomen could be related to the her IBS recommended that the patient follow up with her GI for the abdominal bloating and the previous recommendation to f/u with the provider that manges her sleep apnea. Pt agrees and sts that she will. Advise pt that I will fwd the update to [place] and we will give her a call back if she has additional recommendations. Patient agreeable with the plan and voiced appreciation for the assistance.

## 2018-03-19 NOTE — ED Notes (Signed)
Patient verbalizes understanding of medications and discharge instructions. No further questions at this time. VSS and patient ambulatory at discharge.   

## 2018-03-19 NOTE — ED Triage Notes (Signed)
BIB EMS from home. Pt reports she was sleeping with her CPAP on when she was awakened by a sudden feeling of being SOB and chest heaviness. Hx CHF, COPD. VSS.

## 2018-03-20 ENCOUNTER — Ambulatory Visit: Payer: 59 | Admitting: Physician Assistant

## 2018-03-20 NOTE — Telephone Encounter (Signed)
Advised patient, verbalized understanding  

## 2018-03-20 NOTE — Telephone Encounter (Signed)
Agree and f/u with sleep medicine as advised.

## 2018-03-30 ENCOUNTER — Telehealth: Payer: Self-pay | Admitting: Cardiovascular Disease

## 2018-03-30 ENCOUNTER — Emergency Department (HOSPITAL_COMMUNITY): Payer: 59

## 2018-03-30 ENCOUNTER — Encounter (HOSPITAL_COMMUNITY): Payer: Self-pay

## 2018-03-30 ENCOUNTER — Other Ambulatory Visit: Payer: Self-pay

## 2018-03-30 ENCOUNTER — Emergency Department (HOSPITAL_COMMUNITY)
Admission: EM | Admit: 2018-03-30 | Discharge: 2018-03-30 | Disposition: A | Discharge status: Home / Self Care | Payer: 59 | Attending: Emergency Medicine | Admitting: Emergency Medicine

## 2018-03-30 DIAGNOSIS — Z91040 Latex allergy status: Secondary | ICD-10-CM | POA: Diagnosis not present

## 2018-03-30 DIAGNOSIS — I110 Hypertensive heart disease with heart failure: Secondary | ICD-10-CM | POA: Insufficient documentation

## 2018-03-30 DIAGNOSIS — J069 Acute upper respiratory infection, unspecified: Secondary | ICD-10-CM | POA: Diagnosis not present

## 2018-03-30 DIAGNOSIS — I5032 Chronic diastolic (congestive) heart failure: Secondary | ICD-10-CM | POA: Diagnosis not present

## 2018-03-30 DIAGNOSIS — R1031 Right lower quadrant pain: Secondary | ICD-10-CM | POA: Diagnosis not present

## 2018-03-30 DIAGNOSIS — Z7982 Long term (current) use of aspirin: Secondary | ICD-10-CM | POA: Diagnosis not present

## 2018-03-30 DIAGNOSIS — J449 Chronic obstructive pulmonary disease, unspecified: Secondary | ICD-10-CM | POA: Diagnosis not present

## 2018-03-30 DIAGNOSIS — Z79899 Other long term (current) drug therapy: Secondary | ICD-10-CM | POA: Insufficient documentation

## 2018-03-30 DIAGNOSIS — I2510 Atherosclerotic heart disease of native coronary artery without angina pectoris: Secondary | ICD-10-CM | POA: Diagnosis not present

## 2018-03-30 LAB — COMPREHENSIVE METABOLIC PANEL
ALT: 16 U/L (ref 0–44)
AST: 19 U/L (ref 15–41)
Albumin: 3.7 g/dL (ref 3.5–5.0)
Alkaline Phosphatase: 111 U/L (ref 38–126)
Anion gap: 9 (ref 5–15)
BUN: 21 mg/dL — ABNORMAL HIGH (ref 6–20)
CO2: 25 mmol/L (ref 22–32)
Calcium: 9.2 mg/dL (ref 8.9–10.3)
Chloride: 105 mmol/L (ref 98–111)
Creatinine, Ser: 0.87 mg/dL (ref 0.44–1.00)
GFR calc Af Amer: >60 mL/min (ref >60)
GFR calc non Af Amer: >60 mL/min (ref >60)
Glucose, Bld: 110 mg/dL — ABNORMAL HIGH (ref 70–99)
Potassium: 3.9 mmol/L (ref 3.5–5.1)
Sodium: 139 mmol/L (ref 135–145)
TOTAL PROTEIN: 8.3 g/dL — AB (ref 6.5–8.1)
Total Bilirubin: 0.8 mg/dL (ref 0.3–1.2)

## 2018-03-30 LAB — URINALYSIS, ROUTINE W REFLEX MICROSCOPIC
Bilirubin Urine: NEGATIVE
Glucose, UA: NEGATIVE mg/dL
Hgb urine dipstick: NEGATIVE
Ketones, ur: NEGATIVE mg/dL
Leukocytes,Ua: NEGATIVE
Nitrite: NEGATIVE
Protein, ur: NEGATIVE mg/dL
Specific Gravity, Urine: 1.005 (ref 1.005–1.030)
pH: 5 (ref 5.0–8.0)

## 2018-03-30 LAB — CBC
HCT: 40.5 % (ref 36.0–46.0)
Hemoglobin: 12.4 g/dL (ref 12.0–15.0)
MCH: 30.4 pg (ref 26.0–34.0)
MCHC: 30.6 g/dL (ref 30.0–36.0)
MCV: 99.3 fL (ref 80.0–100.0)
PLATELETS: 333 10*3/uL (ref 150–400)
RBC: 4.08 MIL/uL (ref 3.87–5.11)
RDW: 13.5 % (ref 11.5–15.5)
WBC: 11.8 10*3/uL — ABNORMAL HIGH (ref 4.0–10.5)
nRBC: 0 % (ref 0.0–0.2)

## 2018-03-30 LAB — LIPASE, BLOOD: Lipase: 39 U/L (ref 11–51)

## 2018-03-30 MED ORDER — IOPAMIDOL (ISOVUE-300) INJECTION 61%
100.0000 mL | Freq: Once | INTRAVENOUS | Status: AC | PRN
  Administered 2018-03-30: 100 mL via INTRAVENOUS

## 2018-03-30 MED ORDER — ONDANSETRON HCL 4 MG PO TABS: 4.0000 mg | ORAL_TABLET | Freq: Three times a day (TID) | ORAL | 0 refills | Status: DC | PRN

## 2018-03-30 MED ORDER — TRAMADOL HCL 50 MG PO TABS: 50.0000 mg | ORAL_TABLET | Freq: Four times a day (QID) | ORAL | 0 refills | Status: DC | PRN

## 2018-03-30 MED ORDER — ONDANSETRON HCL 4 MG/2ML IJ SOLN
4.0000 mg | Freq: Once | INTRAMUSCULAR | Status: AC
  Administered 2018-03-30: 4 mg via INTRAVENOUS
  Filled 2018-03-30: qty 2

## 2018-03-30 MED ORDER — SODIUM CHLORIDE (PF) 0.9 % IJ SOLN
INTRAMUSCULAR | Status: AC
  Filled 2018-03-30: qty 50

## 2018-03-30 MED ORDER — SODIUM CHLORIDE 0.9% FLUSH: 3.0000 mL | Freq: Once | INTRAVENOUS | Status: DC

## 2018-03-30 MED ORDER — IOPAMIDOL (ISOVUE-300) INJECTION 61%
INTRAVENOUS | Status: AC
  Filled 2018-03-30: qty 100

## 2018-03-30 NOTE — ED Notes (Signed)
Pt verbalizing upset because had a bad experience asking for bathroom assistance from the lobby. This Probation officer told pt to ask for this Probation officer if needs additional assistance from lobby to restroom. Pt calm and verbalizes okay.

## 2018-03-30 NOTE — ED Provider Notes (Addendum)
[facility] DEPT Provider Note   CSN: [id number] Arrival date & time: 03/30/18  1235    History   Chief Complaint Chief Complaint  Patient presents with  . Abdominal Pain  . Emesis  . Cough    HPI [name] is a 58 y.o. female who presents for abdominal pain. Onset 1 week ago. Saw her pcp and had leukocytosis. She returned today and and white count was higher. She was sent in by pcp.  Pain is worse with eating. NO N/V/D/C.  She also has cough which is productive of sputum describedas white.  She has noticed some streaky blood in it and has had some post tussive emesis.  She denies wheezing fevers or chills.      HPI  Past Medical History:  Diagnosis Date  . Acute on chronic diastolic CHF (congestive heart failure) ([place]) 04/11/2014  . Acute suppurative otitis media without spontaneous rupture of eardrum 02/04/2007   Centricity Description: OTITIS MEDIA, SUPPURATIVE, ACUTE, BILATERAL Qualifier: Diagnosis of  By: [name] MD, [name]  Centricity Description: OTITIS MEDIA, SUPPURATIVE, ACUTE Qualifier: Diagnosis of  By: [name] MD, [name]   . Allergic rhinitis   . Allergy   . Anemia   . Anxiety   . Arthritis   . CAD (coronary artery disease)    LHC (11/29/1999):  EF 60%; no significant CAD.  [name] Carpal tunnel syndrome   . Carpal tunnel syndrome 06/09/2006   Qualifier: Diagnosis of  By: Marca Ancona RMA, Lucy    . Chronic diastolic CHF (congestive heart failure) ([place])    a. Echo (01/21/11): Vigorous LVF, EF 65-70%, no RWMA, Gr 2 DD. b.  Echo (12/15): Moderate LVH, EF 67-20%, grade 2 diastolic dysfunction, mild LAE, normal RV function c. 01/2015: echo showing EF of 60-65% with mild LVH  . Common migraine   . COPD (chronic obstructive pulmonary disease) ([place])   . Diastolic heart failure ([place])   . Diverticulitis   . GERD (gastroesophageal reflux disease)   . Hepatic steatosis   . Hepatomegaly   . Hiatal hernia   . Hyperlipidemia   . Hypertension   .  IBS (irritable bowel syndrome)   . Internal hemorrhoids   . Morbid obesity ([place])   . OSA (obstructive sleep apnea)    CPAP dependent  . PNEUMONIA, ORGAN UNSPECIFIED 03/01/2009   Qualifier: Diagnosis of  By: [name] CMA, [name]    . Sinusitis, chronic 03/10/2012   CT sinuses 02/2012:  Chronic sinusitis with small A/F levels >> rx by ENT with prolonged augmentin F/u ct sinuses 04/2012:  Persistent sinus thickening >> rx with levaquin and clinda for 30 days.  Told to return for sinus scan in 6 weeks >> no showed for followup visit.  CT sinuses 11/2013:  Acute and chronic sinusitis noted.    . Sleep apnea    bi pap    Patient Active Problem List   Diagnosis Date Noted  . Bronchitis 02/12/2018  . Polyp of sigmoid colon   . CKD (chronic kidney disease) stage 3, GFR 30-59 ml/min ([facility]) 07/08/2016  . Cough variant asthma 07/31/2015  . Upper airway cough syndrome 04/11/2015  . Bronchitis, chronic obstructive w acute bronchitis ([place]) 04/11/2015  . Dyspnea   . Acute on chronic diastolic CHF (congestive heart failure), NYHA class 2 ([place]) 03/27/2015  . Morbid obesity due to excess calories ([place]) 03/27/2015  . Dysphagia 03/27/2015  . Hyponatremia 03/27/2015  . Acute kidney injury ([place]) 03/27/2015  . Hypokalemia 03/27/2015  .  Leukocytosis 03/27/2015  . COPD exacerbation ([place]) 03/27/2015  . Sinusitis, chronic 03/10/2012  . Hyperlipidemia 10/02/2006  . Obstructive sleep apnea 06/09/2006  . Essential hypertension 06/09/2006    Past Surgical History:  Procedure Laterality Date  . ABDOMINAL HYSTERECTOMY    . CESAREAN SECTION    . COLONOSCOPY    . COLONOSCOPY WITH PROPOFOL N/A 05/05/2017   Procedure: COLONOSCOPY WITH PROPOFOL;  Surgeon: Mauri Pole, MD;  Location: [facility];  Service: Endoscopy;  Laterality: N/A;  . NASAL SINUS SURGERY    . PARTIAL HYSTERECTOMY    . TONSILLECTOMY    . TUBAL LIGATION    . UVULOPALATOPHARYNGOPLASTY       OB History   No obstetric history on file.       Home Medications    Prior to Admission medications   Medication Sig Start Date End Date Taking? Authorizing Provider  albuterol (PROVENTIL) (2.5 MG/3ML) 0.083% nebulizer solution Take 3 mLs (2.5 mg total) by nebulization every 4 (four) hours as needed for wheezing or shortness of breath. 05/01/17  Yes Tanda Rockers, MD  aspirin 81 MG EC tablet TAKE 1 TABLET BY MOUTH EVERY DAY Patient taking differently: Take 81 mg by mouth daily.  06/06/17  Yes Skeet Latch, MD  benzonatate (TESSALON) 200 MG capsule Take 1 capsule (200 mg total) by mouth 3 (three) times daily as needed for cough. 10/13/17  Yes Chesley Mires, MD  carvedilol (COREG) 12.5 MG tablet Take 1 tablet (12.5 mg total) by mouth 2 (two) times daily with a meal. 01/22/18  Yes Skeet Latch, MD  chlorpheniramine (CHLOR-TRIMETON) 4 MG tablet Take 8 mg by mouth at bedtime. May add 1 extra every 4 hours as needed for drainage, drippy nose, throat clearing   Yes [provider]  Dextromethorphan-Guaifenesin (MUCINEX DM MAXIMUM STRENGTH) 60-1200 MG TB12 Take 1 tablet by mouth at bedtime as needed (cough).    Yes [provider]  famotidine (PEPCID) 20 MG tablet Take 1 tablet (20 mg total) by mouth daily at 3 pm. 06/12/17  Yes Tanda Rockers, MD  fluticasone (FLONASE) 50 MCG/ACT nasal spray USE 2 SPRAYS IN EACH NOSTRIL EVERY DAY Patient taking differently: Place 2 sprays into both nostrils daily.  10/27/17  Yes Chesley Mires, MD  hydrALAZINE (APRESOLINE) 25 MG tablet Take 12.5 mg by mouth 3 (three) times daily.   Yes [provider]  isosorbide dinitrate (ISORDIL) 20 MG tablet Take 1 tablet (20 mg total) by mouth 3 (three) times daily. 02/20/18  Yes Skeet Latch, MD  losartan (COZAAR) 50 MG tablet TAKE 1 TABLET BY MOUTH EVERY DAY Patient taking differently: Take 50 mg by mouth daily.  12/01/17  Yes Skeet Latch, MD  meloxicam (MOBIC) 15 MG tablet Take 15 mg by mouth at bedtime.  03/31/17  Yes [provider]  metolazone (ZAROXOLYN) 2.5 MG tablet Take 2.5 mg by mouth daily as needed (FOR OVERNIGHT WEIGHT GAIN OF 3 lbs. or more).   Yes [provider]  montelukast (SINGULAIR) 10 MG tablet TAKE 1 TABLET(10 MG) BY MOUTH AT BEDTIME Patient taking differently: Take 10 mg by mouth at bedtime.  07/25/17  Yes Tanda Rockers, MD  pantoprazole (PROTONIX) 40 MG tablet TAKE 1 TABLET BY MOUTH DAILY, TAKE 30 TO 60 MINUTES BEFORE FIRST MEAL OF THE DAY Patient taking differently: Take 40 mg by mouth daily. TAKE 1 TABLET BY MOUTH DAILY, TAKE 30 TO 60 MINUTES BEFORE FIRST MEAL OF THE DAY 03/25/17  Yes Nandigam, Venia Minks,  MD  polyethylene glycol powder (GLYCOLAX/MIRALAX) powder Use 2 capfuls in the morning and 1 capful in the evening and as directed Patient taking differently: Take 17-34 g by mouth every other day. Use 2 capfuls in the morning and 1 capful in the evening 03/24/17  Yes Nandigam, Venia Minks, MD  potassium chloride SA (K-DUR,KLOR-CON) 20 MEQ tablet TAKE 3 TABLETS (60 MEQ) BY MOUTH THREE TIMES DAILY Patient taking differently: Take 60 mEq by mouth 3 (three) times daily.  09/23/17  Yes Minus Breeding, MD  promethazine (PHENERGAN) 25 MG tablet Take 1 tablet (25 mg total) by mouth every 6 (six) hours as needed for nausea or vomiting. 04/18/17  Yes Lawyer, Harrell Gave, PA-C  spironolactone (ALDACTONE) 25 MG tablet TAKE 1/2 TABLET BY MOUTH TWICE A DAY Patient taking differently: Take 12.5 mg by mouth 2 (two) times daily.  12/02/17  Yes Skeet Latch, MD  topiramate (TOPAMAX) 25 MG tablet Take 25 mg by mouth 2 (two) times daily as needed (For migraines.).    Yes [provider]  torsemide (DEMADEX) 20 MG tablet TAKE 2 TABLETS(40 MG) BY MOUTH TWICE DAILY Patient taking differently: Take 40 mg by mouth 2 (two) times daily.  06/02/17  Yes Skeet Latch, MD  doxycycline (VIBRA-TABS) 100 MG tablet Take 1 tablet (100 mg total) by mouth 2 (two) times daily. Patient not taking: Reported on  03/30/2018 03/11/18   Tanda Rockers, MD  ondansetron (ZOFRAN) 4 MG tablet Take 1 tablet (4 mg total) by mouth every 8 (eight) hours as needed for nausea or vomiting. 03/30/18   Margarita Mail, PA-C  Respiratory Therapy Supplies (FLUTTER) DEVI 1 application by Does not apply route as directed. 12/27/16   Tanda Rockers, MD  traMADol (ULTRAM) 50 MG tablet Take 1 tablet (50 mg total) by mouth every 6 (six) hours as needed. 03/30/18   Artrice Kraker, Vernie Shanks, PA-C  UNABLE TO FIND Med Name: CPAP with sleep    [provider]    Family History Family History  Problem Relation Age of Onset  . Heart disease Mother   . Heart attack Mother   . Hypertension Mother   . Coronary artery disease Other        1st degree relatvie <50  . Breast cancer Other        aunt- ? paternal or maternal  . Asthma Sister   . Colon polyps Sister   . Stroke Neg Hx   . Colon cancer Neg Hx   . Esophageal cancer Neg Hx   . Rectal cancer Neg Hx   . Stomach cancer Neg Hx     Social History Social History   Tobacco Use  . Smoking status: Never Smoker  . Smokeless tobacco: Never Used  Substance Use Topics  . Alcohol use: No  . Drug use: No     Allergies   Sulfonamide derivatives; Doxycycline; Latex; Ciprofloxacin; Fish allergy; Hydrocortisone; and Neomycin   Review of Systems Review of Systems   Physical Exam Updated Vital Signs BP (!) 140/52 (BP Location: Left Arm)   Pulse 85   Temp 98.6 F (37 C) (Oral)   Resp 16   Ht 5\' 3"  (1.6 m)   Wt 131.5 kg   SpO2 98%   BMI 51.35 kg/m   Physical Exam Vitals signs and nursing note reviewed.  Constitutional:      General: She is not in acute distress.    Appearance: She is well-developed. She is obese. She is not ill-appearing, toxic-appearing or diaphoretic.  HENT:     Head: Normocephalic and atraumatic.     Mouth/Throat:     Mouth: Mucous membranes are moist.     Pharynx: Oropharynx is clear.  Eyes:     General: No scleral icterus.    Extraocular  Movements: Extraocular movements intact.     Conjunctiva/sclera: Conjunctivae normal.     Pupils: Pupils are equal, round, and reactive to light.  Neck:     Musculoskeletal: Normal range of motion.  Cardiovascular:     Rate and Rhythm: Normal rate and regular rhythm.     Heart sounds: Normal heart sounds. No murmur. No friction rub. No gallop.      Comments: Heart sounds distant Pulmonary:     Effort: Pulmonary effort is normal. No respiratory distress.     Breath sounds: No wheezing or rhonchi.     Comments: exam limited by body habitus Abdominal:     General: Abdomen is protuberant. There is no distension.     Palpations: Abdomen is soft. There is no mass.     Tenderness: There is generalized abdominal tenderness. There is no guarding.     Comments: Exam limited by body habitus  Skin:    General: Skin is warm and dry.  Neurological:     Mental Status: She is alert and oriented to person, place, and time.  Psychiatric:        Behavior: Behavior normal.      ED Treatments / Results  Labs (all labs ordered are listed, but only abnormal results are displayed) Labs Reviewed  COMPREHENSIVE METABOLIC PANEL - Abnormal; Notable for the following components:      Result Value   Glucose, Bld 110 (*)    BUN 21 (*)    Total Protein 8.3 (*)    All other components within normal limits  CBC - Abnormal; Notable for the following components:   WBC 11.8 (*)    All other components within normal limits  URINALYSIS, ROUTINE W REFLEX MICROSCOPIC - Abnormal; Notable for the following components:   Color, Urine COLORLESS (*)    All other components within normal limits  LIPASE, BLOOD    EKG None  Radiology Dg Chest 2 View  Result Date: 03/30/2018 CLINICAL DATA:  Cough. EXAM: CHEST - 2 VIEW COMPARISON:  Radiographs of March 19, 2018. FINDINGS: Stable cardiomegaly. No pneumothorax or pleural effusion is noted. Both lungs are clear. The visualized skeletal structures are unremarkable.  IMPRESSION: No active cardiopulmonary disease. Electronically Signed   By: Marijo Conception, M.D.   On: 03/30/2018 17:34   Ct Abdomen Pelvis W Contrast  Result Date: 03/30/2018 CLINICAL DATA:  Abdominal pain, generalized EXAM: CT ABDOMEN AND PELVIS WITH CONTRAST TECHNIQUE: Multidetector CT imaging of the abdomen and pelvis was performed using the standard protocol following bolus administration of intravenous contrast. CONTRAST:  128mL ISOVUE-300 IOPAMIDOL (ISOVUE-300) INJECTION 61% COMPARISON:  CT 10/12/2017, 04/18/2017, 06/26/2016 FINDINGS: Lower chest: Lung bases demonstrate no acute consolidation or effusion. Prominent subpleural fat at the left greater than right lung base. Small hiatal hernia. Normal heart size. Hepatobiliary: No focal liver abnormality is seen. No gallstones, gallbladder wall thickening, or biliary dilatation. Pancreas: Unremarkable. No pancreatic ductal dilatation or surrounding inflammatory changes. Spleen: Normal in size without focal abnormality. Adrenals/Urinary Tract: Adrenal glands are unremarkable. Kidneys are normal, without renal calculi, focal lesion, or hydronephrosis. Bladder is unremarkable. Chronic calcification in the right retroperitoneum, separate from the right ureter. Stomach/Bowel: Stomach is within normal limits. Appendix appears normal. No evidence of  bowel wall thickening, distention, or inflammatory changes. Vascular/Lymphatic: No significant vascular findings are present. No enlarged abdominal or pelvic lymph nodes. Reproductive: Status post hysterectomy. No adnexal masses. Other: Negative for free air or free fluid. Moderate fat containing umbilical hernia Musculoskeletal: No acute or suspicious osseous abnormality IMPRESSION: No CT evidence for acute intra-abdominal or pelvic abnormality. Moderate fat containing umbilical hernia. Electronically Signed   By: Donavan Foil M.D.   On: 03/30/2018 19:06    Procedures Procedures (including critical care  time)  Medications Ordered in ED Medications  sodium chloride flush (NS) 0.9 % injection 3 mL (has no administration in time range)  iopamidol (ISOVUE-300) 61 % injection (has no administration in time range)  sodium chloride (PF) 0.9 % injection (has no administration in time range)  iopamidol (ISOVUE-300) 61 % injection 100 mL (100 mLs Intravenous Contrast Given 03/30/18 1845)  ondansetron (ZOFRAN) injection 4 mg (4 mg Intravenous Given 03/30/18 2056)     Initial Impression / Assessment and Plan / ED Course  I have reviewed the triage vital signs and the nursing notes.  Pertinent labs & imaging results that were available during my care of the patient were reviewed by me and considered in my medical decision making (see chart for details).        Pt CXR negative for acute infiltrate.  I personally reviewed the PA and lateral chest film and agree with radiologic interpretation.  Patients symptoms are consistent with URI, likely viral etiology. Discussed that antibiotics are not indicated for viral infections. Pt will be discharged with symptomatic treatment.  Patient CMP, urine negative.  Lipase negative.  There is a mildly elevated leukpcytosis.  Her CT abdomen and pelvis shows no acute abnormalities this may be secondary to her umbilical hernia.  Patient's exam is markedly limited secondary to her obese abdomen and very large abdominal pannus which hangs to the mid thigh.  Do not feel that this pain represents an acute abdominal pelvic process.  She has follow-up with OB/GYN and is status post hysterectomy.  She does have ovaries in place however she has minimal tenderness and pain, no active vomiting.  I doubt ovarian torsion.  She is advised to follow very closely with her PCP  and OB/GYN.  She denies any active vaginal bleeding or urinary symptoms.  Urinalysis is negative for urinary tract infection.  Patient appears appropriate for discharge with strong return precautions at this  time  Final Clinical Impressions(s) / ED Diagnoses   Final diagnoses:  Right lower quadrant abdominal pain  Viral upper respiratory tract infection    ED Discharge Orders         Ordered    traMADol (ULTRAM) 50 MG tablet  Every 6 hours PRN     03/30/18 2037    ondansetron (ZOFRAN) 4 MG tablet  Every 8 hours PRN     03/30/18 2037           Margarita Mail, PA-C 03/30/18 2305    Fredia Sorrow, MD 03/31/18 0015    Margarita Mail, PA-C 04/08/18 1120    Fredia Sorrow, MD 04/08/18 1317

## 2018-03-30 NOTE — Discharge Instructions (Signed)

## 2018-03-30 NOTE — Telephone Encounter (Signed)
Pt called she stated that she was going to the ER to be evaluated Per her pulmonologist .

## 2018-03-30 NOTE — Telephone Encounter (Signed)
New Message         Patient is calling in today to get an appt. For medication check but nothing  Is available. Pls call and advise.

## 2018-03-30 NOTE — ED Notes (Signed)
Pt assisted to restroom.  

## 2018-03-30 NOTE — Telephone Encounter (Incomplete)
New Message  Do not need this note      Pt c/o medication issue:  1. Name of Medication: ***  2. How are you currently taking this medication (dosage and times per day)? ***  3. Are you having a reaction (difficulty breathing--STAT)? ***  4. What is your medication issue? ***

## 2018-03-30 NOTE — ED Triage Notes (Addendum)
Pt BIB EMS from home. Pt reports going to [facility] last month and was told that her WBC were elevated and that she was recently seen at her PCP where for WBC were still elevated. Pt reports hx of COPD and has had a cough for around 3 days. Pt reports having productive blood streaked cough. Pt also reports sharp right sided abdominal pain and vomiting.   150/80 HR 62 RR 20 98% RA

## 2018-04-07 ENCOUNTER — Ambulatory Visit: Payer: 59

## 2018-04-13 ENCOUNTER — Telehealth: Payer: Self-pay | Admitting: Internal Medicine

## 2018-04-13 NOTE — Telephone Encounter (Signed)
Called and spoke with pt stating to her all the recs per MW. Pt expressed understanding. Nothing further needed.

## 2018-04-13 NOTE — Telephone Encounter (Signed)
Looks like she can just follow the prev ov instructions:  Pantoprazole (protonix) 40 mg Take 30-60 min before first meal of the day and Pepcid (famotidine) 20 mg one after supperuntil return to office - this is the best way to tell whether stomach acid is contributing to your problem.  Strongly recommend 6-8 in bed blocks under head  Doxycline 100 mg twice daily x 10 days as needed for nasty mucus (meaning this should have been a refillable rx x 11 months to have on hand for exactly this reason)    For cough >mucinex dm up to 1200 mg every 12 hours and the flutter valve   Ok to try nebulizer 15 min before you go shopping to to see if makes your breathing - ok to use up to every 4 hours as needed

## 2018-04-13 NOTE — Telephone Encounter (Signed)
Called and spoke with Patient.  Patient was last seen by Dr [name], 03/11/18, for cough, asthma.  [place] started having a cough after she returned, Monday. Patient stated she is now having a productive cough, with thick, clear phlegm.  Patient stated she was unable to check her temp, but denies chills.  Patient stated she is feeling bad, and tired.  Patient stated she is taking Mucinex DM, and chlorpheniramine.  Patient request any prescriptions to be sent to [facility].   Instructions   Pantoprazole (protonix) 40 mg   Take  30-60 min before first meal of the day and Pepcid (famotidine)  20 mg one after supper until return to office - this is the best way to tell whether stomach acid is contributing to your problem.    Strongly recommend 6-8 in bed blocks under head   Doxycline 100 mg twice daily x 10 days as needed for nasty mucus    For cough > mucinex dm up to 1200 mg every 12 hours and the flutter valve   Ok to try nebulizer 15 min before you go shopping to to see if makes your breathing   Please schedule a follow up office visit in 6 weeks, call sooner if needed with all medications /inhalers/ solutions in hand so we can verify exactly what you are taking. This includes all medications from all doctors and over the [place] separate them into two bags:  the ones you take automatically, no matter what, vs the ones you take just when you feel you need them "BAG #2 is UP TO YOU"  - this will really help [place] help you take your medications more effectively - pfts on return without your nebulizer that day      Message routed to Dr [name], to advise

## 2018-04-14 ENCOUNTER — Telehealth: Payer: Self-pay | Admitting: Pulmonary Disease

## 2018-04-14 ENCOUNTER — Telehealth: Payer: Self-pay | Admitting: Internal Medicine

## 2018-04-14 ENCOUNTER — Other Ambulatory Visit: Payer: Self-pay

## 2018-04-14 ENCOUNTER — Encounter: Payer: Self-pay | Admitting: Pulmonary Disease

## 2018-04-14 ENCOUNTER — Telehealth (INDEPENDENT_AMBULATORY_CARE_PROVIDER_SITE_OTHER): Payer: 59 | Admitting: Pulmonary Disease

## 2018-04-14 DIAGNOSIS — G4733 Obstructive sleep apnea (adult) (pediatric): Secondary | ICD-10-CM

## 2018-04-14 DIAGNOSIS — J40 Bronchitis, not specified as acute or chronic: Secondary | ICD-10-CM

## 2018-04-14 NOTE — Progress Notes (Signed)
Chart and office note reviewed in detail  > agree with a/p as outlined    

## 2018-04-14 NOTE — Progress Notes (Signed)
Virtual Visit via Telephone Note  I connected with [name] on 04/14/18 at  3:00 PM EDT by telephone and verified that I am speaking with the correct person using two identifiers.   I discussed the limitations, risks, security and privacy concerns of performing an evaluation and management service by telephone and the availability of in person appointments. I also discussed with the patient that there may be a patient responsible charge related to this service. The patient expressed understanding and agreed to proceed.   History of Present Illness:  58 year old female never smoker contacted via telephone today.  Patient reporting that she has had a cough that is started on 04/09/2018.  Patient reports that she started her doxycycline which she had at home at that time.  Patient reports the cough is been productive since 04/09/2018.  The sputum production is thick white.  Patient reports that she feels hot and is tired.  But is afebrile.  Patient checked her temperature today and it was 98.6.  Patient denies any recent sick contacts except for her brother 2 weeks ago when she was visiting her mother in [place].  She has been taking her chlor tabs, Flonase, Pepcid, Protonix, diuretics as prescribed.  Patient reports she is using her nebulizer every 4 hours as able.  Patient denies allergy symptoms or nasal drainage.  Patient denies wheezing.  Patient denies orthopnea or recent weight gain.  Patient reports her weight today was 290 pounds at home.  Patient also reports that she is been doing nasal saline rinses.  She occasionally uses her flutter valve maybe 3 times daily 1 to 2 breaths each time.  She is also using her incentive spirometer which she is able to pull volumes around 300.  Observations/Objective:   03/11/2018-office spirometry-FVC 2.6 (78% predicted), ratio 80, FEV1 2 (92% predicted), normal spirometry  04/14/2018 -temperature-98.6 04/14/2018-weight-290  pounds  Assessment and Plan:  Obstructive sleep apnea Plan: Continue CPAP use as prescribed Follow-up with Dr. [name] in 2 to 3 months  Bronchitis Assessment: Likely allergic or viral bronchitis Productive cough with thick white phlegm Afebrile today per patient  Plan: Can finish doxycycline as patient is already been started on this Not wheezing so no reason for prednisone or steroids at this time as well as likely not helpful as patient does not have known COPD Can continue Mucinex DM Increase pulmonary hygiene management Increase flutter valve use 2-3 times daily 10 breaths each time Increase incentive spirometer goal should be to reach thousand Continue Chlortab's Continue Singulair Continue nasal saline rinses 2-3 times daily Continue Flonase Continue to monitor temperatures  Morbid obesity due to excess calories ([place]) Assessment: 289 pounds at last office visit Patient reporting weight of 290 pounds today  Plan: Continue to work on losing weight Continue to monitor weights at home   Unfortunately there is not a simple fix for patient's current symptoms.  I understand that the patient wants a pill or medication in order to fix her symptoms but I believe this is likely multifactorial.  Patient with history of chronic bronchitis.  Known allergies.  She is afebrile and is having clear to white sputum.  I believe likely this is an upper respiratory infection caused by a virus or allergic bronchitis.  I do not believe additional medications are needed at this time as the patient's not actively wheezing.  If symptoms change she can always contact our office or follow-up with primary care.  Follow Up Instructions:  Follow-up in 8  to 12 weeks   I discussed the assessment and treatment plan with the patient. The patient was provided an opportunity to ask questions and all were answered. The patient agreed with the plan and demonstrated an understanding of the instructions.    The patient was advised to call back or seek an in-person evaluation if the symptoms worsen or if the condition fails to improve as anticipated.  I provided 24 minutes of non-face-to-face time during this encounter.   Lauraine Rinne, NP

## 2018-04-14 NOTE — Assessment & Plan Note (Signed)
Assessment: 289 pounds at last office visit Patient reporting weight of 290 pounds today  Plan: Continue to work on losing weight Continue to monitor weights at home

## 2018-04-14 NOTE — Patient Instructions (Addendum)
Likely recurrant Bronchitis flare from allergies / virus       Finish Doxycycline >>> 1 100 mg tablet every 12 hours for 7 days >>>take with food  >>>wear sunscreen   Only use your albuterol as a rescue medication to be used if you can't catch your breath by resting or doing a relaxed purse lip breathing pattern.  - The less you use it, the better it will work when you need it. - Ok to use up to 2 puffs  every 4 hours if you must but call for immediate appointment if use goes up over your usual need - Don't leave home without it !!  (think of it like the spare tire for your car)   Continue taking chlorpheniramine (aka Chlor tabs) 4 mg tablet (1 to 2 tablets at night) for management of allergies and postnasal drip at night >>> This is an over-the-counter medication >>> This medication is sedating  Continue Singulair daily Continue daily antihistamine Continue nasal saline rinses 2-3 times daily Continue Flonase as prescribed  Continue adequate pulmonary hygiene such as flutter valve as well as incentive spirometer  Continue to monitor your weight daily Notify primary care if symptoms are not improving or if your weight start to trend up as you are at high risk of having an exacerbation due to fluid   We recommend that you continue using your CPAP daily >>>Keep up the hard work using your device >>> Goal should be wearing this for the entire night that you are sleeping, at least 4 to 6 hours  Remember:  . Do not drive or operate heavy machinery if tired or drowsy.  . Please notify the supply company and office if you are unable to use your device regularly due to missing supplies or machine being broken.  . Work on maintaining a healthy weight and following your recommended nutrition plan  . Maintain proper daily exercise and movement  . Maintaining proper use of your device can also help improve management of other chronic illnesses such as: Blood pressure, blood sugars, and  weight management.   BiPAP/ CPAP Cleaning:  >>>Clean weekly, with Dawn soap, and bottle brush.  Set up to air dry.   Follow-up with Dr. [name] in 8 weeks, or can start seeing Dr. [name] for pulmonary needs as you see him for Sleep management          Coronavirus (COVID-19) Are you at risk?  Are you at risk for the Coronavirus (COVID-19)?  To be considered HIGH RISK for Coronavirus (COVID-19), you have to meet the following criteria:  . Traveled to [place], [place], [place], [place] or [place]; or in the [place] to [place], [place], [place], or [place]; and have fever, cough, and shortness of breath within the last 2 weeks of travel OR . Been in close contact with a person diagnosed with COVID-19 within the last 2 weeks and have fever, cough, and shortness of breath . IF YOU DO NOT MEET THESE CRITERIA, YOU ARE CONSIDERED LOW RISK FOR COVID-19.  What to do if you are HIGH RISK for COVID-19?  [name] If you are having a medical emergency, call 911. . Seek medical care right away. Before you go to a doctor's office, urgent care or emergency department, call ahead and tell them about your recent travel, contact with someone diagnosed with COVID-19, and your symptoms. You should receive instructions from your physician's office regarding next steps of care.  . When you arrive at  healthcare provider, tell the healthcare staff immediately you have returned from visiting [place], [place], [place], [place] or [place]; or traveled in the [place] to [place], [place], [place], or [place]; in the last two weeks or you have been in close contact with a person diagnosed with COVID-19 in the last 2 weeks.   . Tell the health care staff about your symptoms: fever, cough and shortness of breath. . After you have been seen by a medical provider, you will be either: o Tested for (COVID-19) and discharged home on quarantine except to seek medical care if symptoms worsen, and asked to  - Stay  home and avoid contact with others until you get your results (4-5 days)  - Avoid travel on public transportation if possible (such as bus, train, or airplane) or o Sent to the Emergency Department by EMS for evaluation, COVID-19 testing, and possible admission depending on your condition and test results.  What to do if you are LOW RISK for COVID-19?  Reduce your risk of any infection by using the same precautions used for avoiding the common cold or flu:  [name] Wash your hands often with soap and warm water for at least 20 seconds.  If soap and water are not readily available, use an alcohol-based hand sanitizer with at least 60% alcohol.  . If coughing or sneezing, cover your mouth and nose by coughing or sneezing into the elbow areas of your shirt or coat, into a tissue or into your sleeve (not your hands). . Avoid shaking hands with others and consider head nods or verbal greetings only. . Avoid touching your eyes, nose, or mouth with unwashed hands.  . Avoid close contact with people who are sick. . Avoid places or events with large numbers of people in one location, like concerts or sporting events. . Carefully consider travel plans you have or are making. . If you are planning any travel outside or inside the [place], visit the CDC's Travelers' Health webpage for the latest health notices. . If you have some symptoms but not all symptoms, continue to monitor at home and seek medical attention if your symptoms worsen. . If you are having a medical emergency, call 911.   [place] / e-Visit: [email or web address]         [facility]: [facility]: [phone]                   [facility]: [phone]           It is flu season:   >>> Best ways to protect herself from the flu: Receive the yearly flu vaccine, practice good hand  hygiene washing with soap and also using hand sanitizer when available, eat a nutritious meals, get adequate rest, hydrate appropriately   Please contact the office if your symptoms worsen or you have concerns that you are not improving.   Thank you for choosing [facility] for your healthcare, and for allowing [place] to partner with you on your healthcare journey. I am thankful to be able to provide care to you today.   Wyn Quaker FNP-C     Acute Bronchitis, Adult Acute bronchitis is when air tubes (bronchi) in the lungs suddenly get swollen. The condition can make it hard to breathe. It can also cause these symptoms:  A cough.  Coughing up clear, yellow, or green mucus.  Wheezing.  Chest congestion.  Shortness of breath.  A fever.  Body aches.  Chills.  A sore throat. Follow these instructions at home:  Medicines  Take over-the-counter and prescription medicines only as told by your doctor.  If you were prescribed an antibiotic medicine, take it as told by your doctor. Do not stop taking the antibiotic even if you start to feel better. General instructions  Rest.  Drink enough fluids to keep your pee (urine) pale yellow.  Avoid smoking and secondhand smoke. If you smoke and you need help quitting, ask your doctor. Quitting will help your lungs heal faster.  Use an inhaler, cool mist vaporizer, or humidifier as told by your doctor.  Keep all follow-up visits as told by your doctor. This is important. How is this prevented? To lower your risk of getting this condition again:  Wash your hands often with soap and water. If you cannot use soap and water, use hand sanitizer.  Avoid contact with people who have cold symptoms.  Try not to touch your hands to your mouth, nose, or eyes.  Make sure to get the flu shot every year. Contact a doctor if:  Your symptoms do not get better in 2 weeks. Get help right away if:  You cough up blood.  You have  chest pain.  You have very bad shortness of breath.  You become dehydrated.  You faint (pass out) or keep feeling like you are going to pass out.  You keep throwing up (vomiting).  You have a very bad headache.  Your fever or chills gets worse. This information is not intended to replace advice given to you by your health care provider. Make sure you discuss any questions you have with your health care provider. Document Released: 06/26/2007 Document Revised: 08/21/2016 Document Reviewed: 06/28/2015 Elsevier Interactive Patient Education  2019 [employer].    Upper Respiratory Infection, Adult An upper respiratory infection (URI) affects the nose, throat, and upper air passages. URIs are caused by germs (viruses). The most common type of URI is often called "the common cold." Medicines cannot cure URIs, but you can do things at home to relieve your symptoms. URIs usually get better within 7-10 days. Follow these instructions at home: Activity  Rest as needed.  If you have a fever, stay home from work or school until your fever is gone, or until your doctor says you may return to work or school. ? You should stay home until you cannot spread the infection anymore (you are not contagious). ? Your doctor may have you wear a face mask so you have less risk of spreading the infection. Relieving symptoms  Gargle with a salt-water mixture 3-4 times a day or as needed. To make a salt-water mixture, completely dissolve -1 tsp of salt in 1 cup of warm water.  Use a cool-mist humidifier to add moisture to the air. This can help you breathe more easily. Eating and drinking   Drink enough fluid to keep your pee (urine) pale yellow.  Eat soups and other clear broths. General instructions   Take over-the-counter and prescription medicines only as told by your doctor. These include cold medicines, fever reducers, and cough suppressants.  Do not use any products that contain nicotine  or tobacco. These include cigarettes and e-cigarettes. If you need help quitting, ask your doctor.  Avoid being where people are smoking (avoid secondhand smoke).  Make sure you get regular shots and get the flu shot every year.  Keep all  follow-up visits as told by your doctor. This is important. How to avoid spreading infection to others   Wash your hands often with soap and water. If you do not have soap and water, use hand sanitizer.  Avoid touching your mouth, face, eyes, or nose.  Cough or sneeze into a tissue or your sleeve or elbow. Do not cough or sneeze into your hand or into the air. Contact a doctor if:  You are getting worse, not better.  You have any of these: ? A fever. ? Chills. ? Brown or red mucus in your nose. ? Yellow or brown fluid (discharge)coming from your nose. ? Pain in your face, especially when you bend forward. ? Swollen neck glands. ? Pain with swallowing. ? White areas in the back of your throat. Get help right away if:  You have shortness of breath that gets worse.  You have very bad or constant: ? Headache. ? Ear pain. ? Pain in your forehead, behind your eyes, and over your cheekbones (sinus pain). ? Chest pain.  You have long-lasting (chronic) lung disease along with any of these: ? Wheezing. ? Long-lasting cough. ? Coughing up blood. ? A change in your usual mucus.  You have a stiff neck.  You have changes in your: ? Vision. ? Hearing. ? Thinking. ? Mood. Summary  An upper respiratory infection (URI) is caused by a germ called a virus. The most common type of URI is often called "the common cold."  URIs usually get better within 7-10 days.  Take over-the-counter and prescription medicines only as told by your doctor. This information is not intended to replace advice given to you by your health care provider. Make sure you discuss any questions you have with your health care provider. Document Released: 06/26/2007 Document  Revised: 08/30/2016 Document Reviewed: 08/30/2016 Elsevier Interactive Patient Education  2019 [employer].

## 2018-04-14 NOTE — Assessment & Plan Note (Signed)
Plan: Continue CPAP use as prescribed Follow-up with Dr. [name] in 2 to 3 months

## 2018-04-14 NOTE — Assessment & Plan Note (Signed)
Assessment: Likely allergic or viral bronchitis Productive cough with thick white phlegm Afebrile today per patient  Plan: Can finish doxycycline as patient is already been started on this Not wheezing so no reason for prednisone or steroids at this time as well as likely not helpful as patient does not have known COPD Can continue Mucinex DM Increase pulmonary hygiene management Increase flutter valve use 2-3 times daily 10 breaths each time Increase incentive spirometer goal should be to reach thousand Continue Chlortab's Continue Singulair Continue nasal saline rinses 2-3 times daily Continue Flonase Continue to monitor temperatures

## 2018-04-14 NOTE — Telephone Encounter (Signed)
Spoke with pt to schedule her follow up visit with MW. Stated she would like to be seen by VS, her sleep doctor, instead, regarding issues with her CPAP. Recall has been placed. Pt also states she would like to speak to VS' nurse today 04/14/2018.

## 2018-04-14 NOTE — Telephone Encounter (Signed)
Spoke with the pt  She states has been taking doxy since 04/10/2018 and not improving  She called yesterday and received the following recs:  Looks like she can just follow the prev ov instructions:  Pantoprazole (protonix) 40 mg Take 30-60 min before first meal of the day and Pepcid (famotidine) 20 mg one after supperuntil return to office - this is the best way to tell whether stomach acid is contributing to your problem.  Strongly recommend 6-8 in bed blocks under head  Doxycline 100 mg twice daily x 10 days as needed for nasty mucus (meaning this should have been a refillable rx x 11 months to have on hand for exactly this reason)    For cough >mucinex dm up to 1200 mg every 12 hours and the flutter valve   Ok to try nebulizer 15 min before you go shopping to to see if makes your breathing - ok to use up to every 4 hours as needed   She states she has been compliant with these recs and nothing is working for the cough She states she has HA and fatigue- unsure of temp bc she does not have a thermometer  Virtual visit with Aaron Edelman this afternoon at 3 pm today

## 2018-04-15 NOTE — Telephone Encounter (Signed)
Please get download from her device.

## 2018-04-15 NOTE — Telephone Encounter (Signed)
DL is printed and handed to VS today  VS please advise, thank you.

## 2018-04-15 NOTE — Telephone Encounter (Signed)
Called and spoke with patient regarding cpap machine concerns Pt currently wears a full face mask with cpap machine each night She feeling the pressure is too much, she feels air shooting in the back of her throat She coughs more in the middle of the night and the morning, and wakes up with dry mouth  VS please advise. Thank you.

## 2018-04-15 NOTE — Telephone Encounter (Signed)
Bipap 03/16/18 to 04/14/18 >> used on 30 of 30 nights with average 8 hrs.  Average AHI 1 with Bipap 16/12 cm H2O.   Please send order to have Bipap changed to 13/9 cm H2O.

## 2018-04-15 NOTE — Progress Notes (Signed)
I have reviewed and agree with assessment/plan.  Okay to transition to me for pulmonary as well as sleep management.  Chesley Mires, MD [facility] 04/15/2018, 12:17 PM

## 2018-04-16 NOTE — Telephone Encounter (Signed)
Called and spoke with patient regarding results.  Informed the patient of results and recommendations today. Advised patient that we are placing order to change BIPAP to 13/9 cm H2O. Placed order to [place] today Pt verbalized understanding and denied any questions or concerns at this time.  Nothing further needed.

## 2018-04-21 ENCOUNTER — Other Ambulatory Visit: Payer: Self-pay | Admitting: Cardiovascular Disease

## 2018-04-22 ENCOUNTER — Telehealth: Payer: Self-pay | Admitting: Internal Medicine

## 2018-04-22 MED ORDER — PREDNISONE 10 MG PO TABS: ORAL_TABLET | ORAL | 0 refills | Status: DC

## 2018-04-22 NOTE — Telephone Encounter (Signed)
Called and spoke with pt letting her know the info from [place]. Stated to pt that I would send in pred taper in to pharmacy for her. Asked her if she had tramadol left or if she needed a new Rx and she stated that she still had some. Stated to pt if she worsened to go to the ER and pt verbalized understanding. Nothing further needed.

## 2018-04-22 NOTE — Telephone Encounter (Signed)
Called and spoke with pt who stated she started coughing 1 week ago and stated she had called and spoken with the office about her symptoms. Pt stated she was told if her symptoms did not improve after abx to call office again so this is why pt is doing so.  Pt is throwing up due to all the coughing she has been doing and is getting up thick white phlegm. Pt also has a headache. Pt denies any chest tightness but states that she has had increased SOB and has to do breathing treatments every 4 hours.  Pt is requesting to have a cxr performed as well as bloodwork since her symptoms are no better than 04/13/2018 when she previously called the office not feeling well.  Dr. [name], please advise on recs for pt. Thanks!

## 2018-04-22 NOTE — Telephone Encounter (Signed)
Prednisone 10 mg take  4 each am x 2 days,   2 each am x 2 days,  1 each am x 2 days and stop   Take delsym (otc)  two tsp every 12 hours and supplement if needed with  tramadol 50 mg up to 2 every 4 hours to suppress the urge to cough. Swallowing water and/or using ice chips/non mint and menthol containing candies (such as lifesavers or sugarless jolly ranchers) are also effective.  You should rest your voice and avoid activities that you know make you cough.  Once you have eliminated the cough for 3 straight days try reducing the tramadol first,  then the delsym as tolerated.    She should have tramadol already but if needs more I can call it in.  If condition getting worse will need to go to er instead of office as we have done all we can here.

## 2018-04-24 ENCOUNTER — Telehealth: Payer: Self-pay | Admitting: Pharmacist

## 2018-04-24 ENCOUNTER — Telehealth: Payer: Self-pay | Admitting: Pulmonary Disease

## 2018-04-24 DIAGNOSIS — G4733 Obstructive sleep apnea (adult) (pediatric): Secondary | ICD-10-CM

## 2018-04-24 NOTE — Telephone Encounter (Signed)
Patient states that's she is not getting air with her CPAP machine and feels she needs more pressure. Feels as if she is suffocating.  I have downloaded the A.V report. TN please advised

## 2018-04-24 NOTE — Telephone Encounter (Signed)
Patient is aware and order is placed nothing further is needed at this time.  

## 2018-04-24 NOTE — Telephone Encounter (Signed)
Patient [name]. Aware of cancelled appointment.   Okay to call her back to reschedule as needed.

## 2018-04-24 NOTE — Telephone Encounter (Signed)
Follow up: ° ° °Patient returning call back °

## 2018-04-24 NOTE — Telephone Encounter (Signed)
Please change settings to IPAP 15 cmH20, EPAP 11 cmH20. Please have her call back with an update next week.

## 2018-04-27 ENCOUNTER — Other Ambulatory Visit: Payer: Self-pay | Admitting: Cardiovascular Disease

## 2018-04-27 ENCOUNTER — Telehealth: Payer: Self-pay | Admitting: *Deleted

## 2018-04-27 MED ORDER — POTASSIUM CHLORIDE CRYS ER 20 MEQ PO TBCR: EXTENDED_RELEASE_TABLET | ORAL | 0 refills | Status: DC

## 2018-04-27 NOTE — Telephone Encounter (Signed)
Patient returned call

## 2018-04-27 NOTE — Telephone Encounter (Signed)
Pt returned call , please call back   °

## 2018-04-27 NOTE — Telephone Encounter (Signed)
 *  STAT* If patient is at the pharmacy, call can be transferred to refill team.   1. Which medications need to be refilled? (please list name of each medication and dose if known) potassium chloride SA (K-DUR,KLOR-CON) 20 MEQ tablet  2. Which pharmacy/location (including street and city if local pharmacy) is medication to be sent to? [facility] [address]  3. Do they need a 30 day or 90 day supply? [place]

## 2018-04-27 NOTE — Telephone Encounter (Signed)
Spoke with patient and she just had Rx for Medrol dosepak 4/1 from Dr [name], advised if needed refill she needed to reach out to him. For the Zofran advised this was not a cardiac medication and she stated she would just reach out to her GP to get refills of both.

## 2018-04-27 NOTE — Telephone Encounter (Signed)
Patient returned call, please call back  

## 2018-04-27 NOTE — Telephone Encounter (Signed)
Patient not calling about HTN visit. She request refill for potassium tabs - sent  Another request:   Ondansetron and prednisone tabs *Will forward message to DR Oval Linsey to determine if Ondansteron and prednisone may be refilled.

## 2018-04-27 NOTE — Telephone Encounter (Signed)
Per phone note patient wanting refill on  Ondansteron and prednisone she received in ED. Left message to call back to discuss further

## 2018-04-27 NOTE — Addendum Note (Signed)
Addended by: Harrington Challenger on: 04/27/2018 01:31 PM   Modules accepted: Orders

## 2018-04-28 ENCOUNTER — Ambulatory Visit: Payer: 59

## 2018-05-08 ENCOUNTER — Other Ambulatory Visit: Payer: Self-pay | Admitting: Pulmonary Disease

## 2018-05-08 ENCOUNTER — Other Ambulatory Visit: Payer: Self-pay | Admitting: Cardiovascular Disease

## 2018-05-08 ENCOUNTER — Encounter: Payer: Self-pay | Admitting: Pharmacist Clinician (PhC)/ Clinical Pharmacy Specialist

## 2018-05-11 ENCOUNTER — Ambulatory Visit: Payer: 59 | Admitting: Internal Medicine

## 2018-05-11 NOTE — Telephone Encounter (Signed)
Hydralazine 25 mg refilled. 

## 2018-05-12 ENCOUNTER — Ambulatory Visit (INDEPENDENT_AMBULATORY_CARE_PROVIDER_SITE_OTHER): Payer: 59 | Admitting: Gastroenterology

## 2018-05-12 ENCOUNTER — Telehealth: Payer: Self-pay | Admitting: Gastroenterology

## 2018-05-12 ENCOUNTER — Encounter: Payer: Self-pay | Admitting: Gastroenterology

## 2018-05-12 ENCOUNTER — Other Ambulatory Visit: Payer: Self-pay

## 2018-05-12 VITALS — BP 153/79 | HR 56 | Ht 63.0 in | Wt 290.0 lb

## 2018-05-12 DIAGNOSIS — R12 Heartburn: Secondary | ICD-10-CM

## 2018-05-12 DIAGNOSIS — K5900 Constipation, unspecified: Secondary | ICD-10-CM | POA: Diagnosis not present

## 2018-05-12 DIAGNOSIS — K588 Other irritable bowel syndrome: Secondary | ICD-10-CM | POA: Diagnosis not present

## 2018-05-12 DIAGNOSIS — K219 Gastro-esophageal reflux disease without esophagitis: Secondary | ICD-10-CM

## 2018-05-12 MED ORDER — DEXLANSOPRAZOLE 60 MG PO CPDR: 60.0000 mg | DELAYED_RELEASE_CAPSULE | ORAL | 3 refills | Status: DC

## 2018-05-12 NOTE — Progress Notes (Signed)
[name]    [id number]    Jan 29, 1960  Primary Care Physician:Boyd, Dola Factor, MD  Referring Physician: Bartholome Bill, MD [facility] [place], [place] [address]  This service was provided via audio and video telemedicine (Doximity) due to Apison 19 pandemic.  Patient location: Home Provider location: Office Used 2 patient identifiers to confirm the correct person. Explained the limitations in evaluation and management via telemedicine. Patient is aware of potential medical charges for this visit.  Patient consented to this virtual visit.  The persons participating in this telemedicine service were myself and the patient    Chief complaint: Cough, GERD HPI:  58 year old female with morbid obesity, obstructive sleep apnea, hypertension, diastolic heart failure, chronic GERD. Seen by Nevin Bloodgood in office September 2019. 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 Feels her stomach is burning.  She is taking Protonix daily with no significant improvement.  She is trying to eat her dinner early and does not go to bed until after midnight.  Also has her head and elevated but when she lays down with her CPAP on she starts coughing.   She is taking MiraLAX 2 capfuls in the morning and 1 capful at bedtime every other day with regular bowel movements if she takes it daily she has diarrhea.  Denies any melena or blood per rectum.   No dysphagia or odynophagia.  No vomiting.   Outpatient Encounter Medications as of 05/12/2018  Medication Sig  . albuterol (PROVENTIL) (2.5 MG/3ML) 0.083% nebulizer solution Take 3 mLs (2.5 mg total) by nebulization every 4 (four) hours as needed for wheezing or shortness of breath.  [name] aspirin 81 MG EC tablet TAKE 1 TABLET BY MOUTH EVERY DAY  . benzonatate (TESSALON) 200 MG capsule Take 1 capsule (200  mg total) by mouth 3 (three) times daily as needed for cough.  . carvedilol (COREG) 12.5 MG tablet Take 1 tablet (12.5 mg total) by mouth 2 (two) times daily with a meal.  . chlorpheniramine (CHLOR-TRIMETON) 4 MG tablet Take 8 mg by mouth at bedtime. May add 1 extra every 4 hours as needed for drainage, drippy nose, throat clearing  . Dextromethorphan-Guaifenesin (MUCINEX DM MAXIMUM STRENGTH) 60-1200 MG TB12 Take 1 tablet by mouth at bedtime as needed (cough).   . famotidine (PEPCID) 20 MG tablet Take 1 tablet (20 mg total) by mouth daily at 3 pm.  . fluticasone (FLONASE) 50 MCG/ACT nasal spray Place 2 sprays into both nostrils daily.  . hydrALAZINE (APRESOLINE) 25 MG tablet TAKE 1 TABLET BY MOUTH THREE TIMES A DAY  . isosorbide dinitrate (ISORDIL) 20 MG tablet Take 1 tablet (20 mg total) by mouth 3 (three) times daily.  [name] losartan (COZAAR) 50 MG tablet TAKE 1 TABLET BY MOUTH EVERY DAY (Patient taking differently: Take 50 mg by mouth daily. )  . meloxicam (MOBIC) 15 MG tablet Take 15 mg by mouth at bedtime.   . metolazone (ZAROXOLYN) 2.5 MG tablet Take 2.5 mg by mouth daily as needed (FOR OVERNIGHT WEIGHT GAIN OF 3 lbs. or more).  . montelukast (SINGULAIR) 10 MG tablet TAKE 1 TABLET(10 MG) BY MOUTH AT BEDTIME (Patient taking differently: Take 10 mg by mouth at bedtime. )  . ondansetron (ZOFRAN) 4 MG tablet Take 1 tablet (4 mg total) by mouth every 8 (  eight) hours as needed for nausea or vomiting.  . pantoprazole (PROTONIX) 40 MG tablet TAKE 1 TABLET BY MOUTH DAILY, TAKE 30 TO 60 MINUTES BEFORE FIRST MEAL OF THE DAY (Patient taking differently: Take 40 mg by mouth daily. TAKE 1 TABLET BY MOUTH DAILY, TAKE 30 TO 60 MINUTES BEFORE FIRST MEAL OF THE DAY)  . polyethylene glycol powder (GLYCOLAX/MIRALAX) powder Use 2 capfuls in the morning and 1 capful in the evening and as directed (Patient taking differently: Take 17-34 g by mouth every other day. Use 2 capfuls in the morning and 1 capful in the evening)   . potassium chloride SA (K-DUR,KLOR-CON) 20 MEQ tablet TAKE 3 TABLETS (60 MEQ) BY MOUTH THREE TIMES DAILY  . promethazine (PHENERGAN) 25 MG tablet Take 1 tablet (25 mg total) by mouth every 6 (six) hours as needed for nausea or vomiting.  [name] Respiratory Therapy Supplies (FLUTTER) DEVI 1 application by Does not apply route as directed.  [name] spironolactone (ALDACTONE) 25 MG tablet TAKE 1/2 TABLET BY MOUTH TWICE A DAY (Patient taking differently: Take 12.5 mg by mouth 2 (two) times daily. )  . topiramate (TOPAMAX) 25 MG tablet Take 25 mg by mouth 2 (two) times daily as needed (For migraines.).   [name] torsemide (DEMADEX) 20 MG tablet TAKE 2 TABLETS(40 MG) BY MOUTH TWICE DAILY (Patient taking differently: Take 40 mg by mouth 2 (two) times daily. )  . traMADol (ULTRAM) 50 MG tablet Take 1 tablet (50 mg total) by mouth every 6 (six) hours as needed.  [name] UNABLE TO FIND Med Name: CPAP with sleep  . doxycycline (VIBRA-TABS) 100 MG tablet Take 1 tablet (100 mg total) by mouth 2 (two) times daily. (Patient not taking: Reported on 03/30/2018)  . [DISCONTINUED] predniSONE (DELTASONE) 10 MG tablet Take 4tabsx2days, 2tabsx2days, 1tabx2days, then stop   No facility-administered encounter medications on file as of 05/12/2018.     Allergies as of 05/12/2018 - Review Complete 05/12/2018  Allergen Reaction Noted  . Sulfonamide derivatives Anaphylaxis, Swelling, Rash, and Other (See Comments) 10/21/2006  . Doxycycline Nausea And Vomiting 01/05/2008  . Latex Hives 09/28/2011  . Ciprofloxacin Rash 10/21/2006  . Fish allergy Rash and Other (See Comments) 03/08/2012  . Hydrocortisone Rash   . Neomycin Rash 10/21/2006    Past Medical History:  Diagnosis Date  . Acute on chronic diastolic CHF (congestive heart failure) ([place]) 04/11/2014  . Acute suppurative otitis media without spontaneous rupture of eardrum 02/04/2007   Centricity Description: OTITIS MEDIA, SUPPURATIVE, ACUTE, BILATERAL Qualifier: Diagnosis of  By: [name]  MD, [name]  Centricity Description: OTITIS MEDIA, SUPPURATIVE, ACUTE Qualifier: Diagnosis of  By: [name] MD, [name]   . Allergic rhinitis   . Allergy   . Anemia   . Anxiety   . Arthritis   . CAD (coronary artery disease)    LHC (11/29/1999):  EF 60%; no significant CAD.  [name] Carpal tunnel syndrome   . Carpal tunnel syndrome 06/09/2006   Qualifier: Diagnosis of  By: Marca Ancona RMA, Lucy    . Chronic diastolic CHF (congestive heart failure) ([place])    a. Echo (01/21/11): Vigorous LVF, EF 65-70%, no RWMA, Gr 2 DD. b.  Echo (12/15): Moderate LVH, EF 58-52%, grade 2 diastolic dysfunction, mild LAE, normal RV function c. 01/2015: echo showing EF of 60-65% with mild LVH  . Common migraine   . COPD (chronic obstructive pulmonary disease) ([place])   . Diastolic heart failure ([place])   . Diverticulitis   . GERD (gastroesophageal reflux disease)   .  Hepatic steatosis   . Hepatomegaly   . Hiatal hernia   . Hyperlipidemia   . Hypertension   . IBS (irritable bowel syndrome)   . Internal hemorrhoids   . Morbid obesity ([place])   . OSA (obstructive sleep apnea)    CPAP dependent  . PNEUMONIA, ORGAN UNSPECIFIED 03/01/2009   Qualifier: Diagnosis of  By: [name] CMA, [name]    . Sinusitis, chronic 03/10/2012   CT sinuses 02/2012:  Chronic sinusitis with small A/F levels >> rx by ENT with prolonged augmentin F/u ct sinuses 04/2012:  Persistent sinus thickening >> rx with levaquin and clinda for 30 days.  Told to return for sinus scan in 6 weeks >> no showed for followup visit.  CT sinuses 11/2013:  Acute and chronic sinusitis noted.    . Sleep apnea    bi pap    Past Surgical History:  Procedure Laterality Date  . ABDOMINAL HYSTERECTOMY    . CESAREAN SECTION    . COLONOSCOPY    . COLONOSCOPY WITH PROPOFOL N/A 05/05/2017   Procedure: COLONOSCOPY WITH PROPOFOL;  Surgeon: Mauri Pole, MD;  Location: [facility];  Service: Endoscopy;  Laterality: N/A;  . NASAL SINUS SURGERY    . PARTIAL HYSTERECTOMY    .  TONSILLECTOMY    . TUBAL LIGATION    . UVULOPALATOPHARYNGOPLASTY      Family History  Problem Relation Age of Onset  . Heart disease Mother   . Heart attack Mother   . Hypertension Mother   . Coronary artery disease Other        1st degree relatvie <50  . Breast cancer Other        aunt- ? paternal or maternal  . Asthma Sister   . Colon polyps Sister   . Stroke Neg Hx   . Colon cancer Neg Hx   . Esophageal cancer Neg Hx   . Rectal cancer Neg Hx   . Stomach cancer Neg Hx     Social History   Socioeconomic History  . Marital status: Widowed    Spouse name: Not on file  . Number of children: 2  . Years of education: Not on file  . Highest education level: Not on file  Occupational History  . Occupation: Retired  Scientific laboratory technician  . Financial resource strain: Not on file  . Food insecurity:    Worry: Not on file    Inability: Not on file  . Transportation needs:    Medical: Not on file    Non-medical: Not on file  Tobacco Use  . Smoking status: Never Smoker  . Smokeless tobacco: Never Used  Substance and Sexual Activity  . Alcohol use: No  . Drug use: No  . Sexual activity: Not on file  Lifestyle  . Physical activity:    Days per week: Not on file    Minutes per session: Not on file  . Stress: Not on file  Relationships  . Social connections:    Talks on phone: Not on file    Gets together: Not on file    Attends religious service: Not on file    Active member of club or organization: Not on file    Attends meetings of clubs or organizations: Not on file    Relationship status: Not on file  . Intimate partner violence:    Fear of current or ex partner: Not on file    Emotionally abused: Not on file    Physically abused: Not on file  Forced sexual activity: Not on file  Other Topics Concern  . Not on file  Social History Narrative   Charity fundraiser, Oceanographer.   Widowed, lives with son.      Review of systems: Review of Systems as per  HPI All other systems reviewed and are negative.   Physical Exam: Vitals were not taken and physical exam was not performed during this virtual visit.  Data Reviewed:  Reviewed labs, radiology imaging, old records and pertinent past GI work up   Assessment and Plan/Recommendations:  58 year old female with morbid obesity, hypertension, diastolic heart failure, obstructive sleep apnea and GERD with complaints follow-up chronic cough and reflux symptoms Switch to Dexilant 60 mg daily, 30 minutes before breakfast Pepcid 20 mg at bedtime as needed Gaviscon after meals and bedtime as needed for breakthrough heartburn  Antireflux measures   Okay to use dicyclomine as needed up to 3 times daily for IBS symptoms/abdominal cramping  Take MiraLAX 1 capful daily in the morning and half capful at bedtime, titrate to have 1-2 soft bowel movements daily Increase dietary fiber and fluid intake  Follow-up office visit in 2 to 3 months    K. Denzil Magnuson , MD   CC: Bartholome Bill, MD

## 2018-05-12 NOTE — Patient Instructions (Addendum)
If you are age 60 or older, your body mass index should be between 23-30. Your Body mass index is 51.37 kg/m. If this is out of the aforementioned range listed, please consider follow up with your Primary Care Provider.  If you are age 49 or younger, your body mass index should be between 19-25. Your Body mass index is 51.37 kg/m. If this is out of the aformentioned range listed, please consider follow up with your Primary Care Provider.   Switch to Dexilant 60 mg daily, 30 minutes before breakfast  Pepcid 20 mg at bedtime as needed  Gaviscon after meals and bedtime as needed for breakthrough heartburn  Anti-reflux measures  Okay to use Dicyclomine as needed up to 3 times daily for IBS symptoms/abdominal cramping  Take MiraLAX 1 capful daily in the morning and half capful at bedtime, titrate to have 1-2 soft bowel movements daily  Follow-up office visit in 2 to 3 months.  Please call the office in a month or so to make a follow up appointment as the schedule is not available at this time.  Thank you for choosing me and [facility].   Harl Bowie, MD

## 2018-05-12 NOTE — Telephone Encounter (Signed)
Pt called stating that she was returning your call. °

## 2018-05-14 ENCOUNTER — Telehealth: Payer: Self-pay | Admitting: Gastroenterology

## 2018-05-14 NOTE — Telephone Encounter (Signed)
Pt states that she has been experiencing side effects of Dexilant, she gets nauseated, feels very hot and burning in her stomach. Pt wants to know what else she can take.

## 2018-05-14 NOTE — Telephone Encounter (Signed)
Patient is returning your call.  

## 2018-05-14 NOTE — Telephone Encounter (Signed)
Called patient back and she has only taken one dose of Dexilant. And because she is suppose to take it on an empty stomach she was concerned. I asked her to give it a couple of more days  and see if she can tolerate it. To call us back if she continues to have problems after taking it

## 2018-05-15 ENCOUNTER — Ambulatory Visit: Payer: 59 | Admitting: Gastroenterology

## 2018-05-19 ENCOUNTER — Telehealth: Payer: Self-pay | Admitting: Gastroenterology

## 2018-05-19 ENCOUNTER — Other Ambulatory Visit: Payer: Self-pay

## 2018-05-19 NOTE — Telephone Encounter (Signed)
Pt stated that she is having abd p after eating for the third continuous day.

## 2018-05-19 NOTE — Telephone Encounter (Signed)
"  Feels like someone is pumping air in my stomach. When I eat it gets worse. I hurt. I get nauseated." She begins having trouble after her first meal of the day. Pain is worse always after eating. Confirmed correct use of Dexilant. She can take dicyclomine and get a little relief but not complete resolution of the abdominal pain. Confirmed all medications. Having daily soft bowel movements. No vomiting. Responds to Zofran and taking that twice a day. Gaviscon causing constipation. Stopped using it.

## 2018-05-21 NOTE — Telephone Encounter (Signed)
Schedule tele visit next available.  Thanks

## 2018-05-21 NOTE — Telephone Encounter (Signed)
Patient agrees to this plan. Scheduled for 05/26/18

## 2018-05-22 ENCOUNTER — Telehealth: Payer: Self-pay | Admitting: Gastroenterology

## 2018-05-22 NOTE — Telephone Encounter (Signed)
Patient reports continued abdominal pain and nausea.  She is advised to continue her zofran, phenergan, and tramadol until her office visit. She reports that her abdomen hurst when she stops the phenergan.  She is advised to continue meds until OV on Tuesday

## 2018-05-25 ENCOUNTER — Encounter: Payer: Self-pay | Admitting: General Surgery

## 2018-05-26 ENCOUNTER — Encounter: Payer: Self-pay | Admitting: Gastroenterology

## 2018-05-26 ENCOUNTER — Ambulatory Visit (INDEPENDENT_AMBULATORY_CARE_PROVIDER_SITE_OTHER): Payer: 59 | Admitting: Gastroenterology

## 2018-05-26 ENCOUNTER — Other Ambulatory Visit: Payer: Self-pay

## 2018-05-26 VITALS — Ht 63.0 in | Wt 290.0 lb

## 2018-05-26 DIAGNOSIS — K529 Noninfective gastroenteritis and colitis, unspecified: Secondary | ICD-10-CM

## 2018-05-26 DIAGNOSIS — R109 Unspecified abdominal pain: Secondary | ICD-10-CM | POA: Diagnosis not present

## 2018-05-26 DIAGNOSIS — R112 Nausea with vomiting, unspecified: Secondary | ICD-10-CM | POA: Diagnosis not present

## 2018-05-26 DIAGNOSIS — K219 Gastro-esophageal reflux disease without esophagitis: Secondary | ICD-10-CM | POA: Diagnosis not present

## 2018-05-26 MED ORDER — DICYCLOMINE HCL 10 MG PO CAPS: 10.0000 mg | ORAL_CAPSULE | Freq: Three times a day (TID) | ORAL | 0 refills | Status: DC

## 2018-05-26 NOTE — Progress Notes (Signed)
[name]    [id number]    14-Oct-1960  Primary Care Physician:Boyd, Dola Factor, MD  Referring Physician: Bartholome Bill, MD [address] [place], [place] [address]  This service was provided via audio and video telemedicine (Doximity) due to [place] 19 pandemic.  Patient location: Home Provider location: Office Used 2 patient identifiers to confirm the correct person. Explained the limitations in evaluation and management via telemedicine. Patient is aware of potential medical charges for this visit.  Patient consented to this virtual visit.  The persons participating in this telemedicine service were myself and the patient    Chief complaint: Abdominal pain HPI:  58 year old female with morbid obesity,  obstructive sleep apnea, hypertension, diastolic heart failure, last seen in office May 12, 2018.  Colonoscopy April 2019 with removal of 11 mm hamartomatous polyp in sigmoid colon  Colonoscopy November 04, 2003 normal with no polyps  Chronic constipation on MiraLAX twice daily  Chronic GERD was on Protonix, switch to Dexilant last month and started on Pepcid at bedtime  Dicyclomine for IBS  CT abdomen pelvis March 30, 2018: No acute intra-abdominal or pelvic abnormality, moderate fat-containing umbilical hernia  She has been sick since last week, thinks she has the stomach virus Her symptoms started last Wednesday with vomiting for a day, diarrhea continued until this past weekend.  She had low grade fever, chills and myalgia. Abdominal pain generalized improving She feels constipated now, has not had a bowel movement for past 2 days. Decreased appetite with decreased oral intake. Denies any dizziness.  No headaches, loss of smell or taste.      Outpatient Encounter Medications as of 05/26/2018  Medication Sig  . albuterol (PROVENTIL) (2.5 MG/3ML) 0.083% nebulizer solution Take 3 mLs (2.5 mg total) by nebulization every 4  (four) hours as needed for wheezing or shortness of breath.  [name] aspirin 81 MG EC tablet TAKE 1 TABLET BY MOUTH EVERY DAY  . benzonatate (TESSALON) 200 MG capsule Take 1 capsule (200 mg total) by mouth 3 (three) times daily as needed for cough.  . carvedilol (COREG) 12.5 MG tablet Take 1 tablet (12.5 mg total) by mouth 2 (two) times daily with a meal.  . chlorpheniramine (CHLOR-TRIMETON) 4 MG tablet Take 8 mg by mouth at bedtime. May add 1 extra every 4 hours as needed for drainage, drippy nose, throat clearing  . dexlansoprazole (DEXILANT) 60 MG capsule Take 1 capsule (60 mg total) by mouth every morning. Take 30 minutes before breakast  . Dextromethorphan-Guaifenesin (MUCINEX DM MAXIMUM STRENGTH) 60-1200 MG TB12 Take 1 tablet by mouth at bedtime as needed (cough).   . doxycycline (VIBRA-TABS) 100 MG tablet Take 1 tablet (100 mg total) by mouth 2 (two) times daily. (Patient not taking: Reported on 03/30/2018)  . famotidine (PEPCID) 20 MG tablet Take 1 tablet (20 mg total) by mouth daily at 3 pm.  . fluticasone (FLONASE) 50 MCG/ACT nasal spray Place 2 sprays into both nostrils daily.  . hydrALAZINE (APRESOLINE) 25 MG tablet TAKE 1 TABLET BY MOUTH THREE TIMES A DAY  . isosorbide dinitrate (ISORDIL) 20 MG tablet Take 1 tablet (20 mg total) by mouth 3 (three) times daily.  [name] losartan (COZAAR) 50 MG tablet TAKE 1 TABLET BY MOUTH EVERY DAY (Patient taking differently: Take 50 mg by mouth daily. )  . meloxicam (MOBIC) 15 MG tablet Take 15 mg by mouth at bedtime.   . metolazone (ZAROXOLYN) 2.5  MG tablet Take 2.5 mg by mouth daily as needed (FOR OVERNIGHT WEIGHT GAIN OF 3 lbs. or more).  . montelukast (SINGULAIR) 10 MG tablet TAKE 1 TABLET(10 MG) BY MOUTH AT BEDTIME (Patient taking differently: Take 10 mg by mouth at bedtime. )  . ondansetron (ZOFRAN) 4 MG tablet Take 1 tablet (4 mg total) by mouth every 8 (eight) hours as needed for nausea or vomiting.  . polyethylene glycol powder (GLYCOLAX/MIRALAX) powder  Use 2 capfuls in the morning and 1 capful in the evening and as directed (Patient taking differently: Take 17-34 g by mouth every other day. Use 2 capfuls in the morning and 1 capful in the evening)  . potassium chloride SA (K-DUR,KLOR-CON) 20 MEQ tablet TAKE 3 TABLETS (60 MEQ) BY MOUTH THREE TIMES DAILY  . promethazine (PHENERGAN) 25 MG tablet Take 1 tablet (25 mg total) by mouth every 6 (six) hours as needed for nausea or vomiting.  [name] Respiratory Therapy Supplies (FLUTTER) DEVI 1 application by Does not apply route as directed.  [name] spironolactone (ALDACTONE) 25 MG tablet TAKE 1/2 TABLET BY MOUTH TWICE A DAY (Patient taking differently: Take 12.5 mg by mouth 2 (two) times daily. )  . topiramate (TOPAMAX) 25 MG tablet Take 25 mg by mouth 2 (two) times daily as needed (For migraines.).   [name] torsemide (DEMADEX) 20 MG tablet TAKE 2 TABLETS(40 MG) BY MOUTH TWICE DAILY (Patient taking differently: Take 40 mg by mouth 2 (two) times daily. )  . traMADol (ULTRAM) 50 MG tablet Take 1 tablet (50 mg total) by mouth every 6 (six) hours as needed.  [name] UNABLE TO FIND Med Name: CPAP with sleep   No facility-administered encounter medications on file as of 05/26/2018.     Allergies as of 05/26/2018 - Review Complete 05/25/2018  Allergen Reaction Noted  . Sulfonamide derivatives Anaphylaxis, Swelling, Rash, and Other (See Comments) 10/21/2006  . Doxycycline Nausea And Vomiting 01/05/2008  . Latex Hives 09/28/2011  . Ciprofloxacin Rash 10/21/2006  . Fish allergy Rash and Other (See Comments) 03/08/2012  . Hydrocortisone Rash   . Neomycin Rash 10/21/2006    Past Medical History:  Diagnosis Date  . Acute on chronic diastolic CHF (congestive heart failure) ([place]) 04/11/2014  . Acute suppurative otitis media without spontaneous rupture of eardrum 02/04/2007   Centricity Description: OTITIS MEDIA, SUPPURATIVE, ACUTE, BILATERAL Qualifier: Diagnosis of  By: [name] MD, [name]  Centricity Description: OTITIS MEDIA,  SUPPURATIVE, ACUTE Qualifier: Diagnosis of  By: [name] MD, [name]   . Allergic rhinitis   . Allergy   . Anemia   . Anxiety   . Arthritis   . CAD (coronary artery disease)    LHC (11/29/1999):  EF 60%; no significant CAD.  [name] Carpal tunnel syndrome   . Carpal tunnel syndrome 06/09/2006   Qualifier: Diagnosis of  By: Marca Ancona RMA, Lucy    . Chronic diastolic CHF (congestive heart failure) ([place])    a. Echo (01/21/11): Vigorous LVF, EF 65-70%, no RWMA, Gr 2 DD. b.  Echo (12/15): Moderate LVH, EF 95-28%, grade 2 diastolic dysfunction, mild LAE, normal RV function c. 01/2015: echo showing EF of 60-65% with mild LVH  . Common migraine   . COPD (chronic obstructive pulmonary disease) ([place])   . Diastolic heart failure ([place])   . Diverticulitis   . GERD (gastroesophageal reflux disease)   . Hepatic steatosis   . Hepatomegaly   . Hiatal hernia   . Hyperlipidemia   . Hypertension   . IBS (irritable  bowel syndrome)   . Internal hemorrhoids   . Morbid obesity ([place])   . OSA (obstructive sleep apnea)    CPAP dependent  . PNEUMONIA, ORGAN UNSPECIFIED 03/01/2009   Qualifier: Diagnosis of  By: [name] CMA, [name]    . Sinusitis, chronic 03/10/2012   CT sinuses 02/2012:  Chronic sinusitis with small A/F levels >> rx by ENT with prolonged augmentin F/u ct sinuses 04/2012:  Persistent sinus thickening >> rx with levaquin and clinda for 30 days.  Told to return for sinus scan in 6 weeks >> no showed for followup visit.  CT sinuses 11/2013:  Acute and chronic sinusitis noted.    . Sleep apnea    bi pap    Past Surgical History:  Procedure Laterality Date  . ABDOMINAL HYSTERECTOMY    . CESAREAN SECTION    . COLONOSCOPY    . COLONOSCOPY WITH PROPOFOL N/A 05/05/2017   Procedure: COLONOSCOPY WITH PROPOFOL;  Surgeon: Mauri Pole, MD;  Location: [facility];  Service: Endoscopy;  Laterality: N/A;  . NASAL SINUS SURGERY    . PARTIAL HYSTERECTOMY    . TONSILLECTOMY    . TUBAL LIGATION    .  UVULOPALATOPHARYNGOPLASTY      Family History  Problem Relation Age of Onset  . Heart disease Mother   . Heart attack Mother   . Hypertension Mother   . Coronary artery disease Other        1st degree relatvie <50  . Breast cancer Other        aunt- ? paternal or maternal  . Asthma Sister   . Colon polyps Sister   . Stroke Neg Hx   . Colon cancer Neg Hx   . Esophageal cancer Neg Hx   . Rectal cancer Neg Hx   . Stomach cancer Neg Hx     Social History   Socioeconomic History  . Marital status: Widowed    Spouse name: Not on file  . Number of children: 2  . Years of education: Not on file  . Highest education level: Not on file  Occupational History  . Occupation: Retired  Scientific laboratory technician  . Financial resource strain: Not on file  . Food insecurity:    Worry: Not on file    Inability: Not on file  . Transportation needs:    Medical: Not on file    Non-medical: Not on file  Tobacco Use  . Smoking status: Never Smoker  . Smokeless tobacco: Never Used  Substance and Sexual Activity  . Alcohol use: No  . Drug use: No  . Sexual activity: Not on file  Lifestyle  . Physical activity:    Days per week: Not on file    Minutes per session: Not on file  . Stress: Not on file  Relationships  . Social connections:    Talks on phone: Not on file    Gets together: Not on file    Attends religious service: Not on file    Active member of club or organization: Not on file    Attends meetings of clubs or organizations: Not on file    Relationship status: Not on file  . Intimate partner violence:    Fear of current or ex partner: Not on file    Emotionally abused: Not on file    Physically abused: Not on file    Forced sexual activity: Not on file  Other Topics Concern  . Not on file  Social History Narrative  Charity fundraiser, Oceanographer.   Widowed, lives with son.      Review of systems: Review of Systems as per HPI All other systems reviewed and are  negative.   Physical Exam: Vitals were not taken and physical exam was not performed during this virtual visit.  Data Reviewed:  Reviewed labs, radiology imaging, old records and pertinent past GI work up   Assessment and Plan/Recommendations:  59 year old female with morbid obesity, hypertension, hyperlipidemia, chronic kidney disease, diastolic heart failure, COPD, chronic GERD, constipation and IBS with fever, myalgia, chills, nausea, vomiting and diarrhea concerning for acute viral infection, ?  SARS-CoV-2.  Denies any exposure to anyone with COVID-19.  She has not stepped outside her house since mid March.  Advised patient to continue social distancing and to self isolate Given her symptoms are improving and sporadic community testing, will not be able to test her.  Continue Dexilant for GERD symptoms Antireflux measures  She is starting to get constipated, restart MiraLAX twice daily, titrate as needed to have 1-2 soft bowel movements  Continue dicyclomine up to 4 times daily as needed for abdominal cramping  Maintain adequate fluid intake to prevent dehydration Small frequent meals  Follow-up in 2 to 3 months or sooner if needed    K. Denzil Magnuson , MD   CC: Bartholome Bill, MD

## 2018-05-26 NOTE — Patient Instructions (Addendum)
Maintain adequate fluid intake to prevent dehydration  Dicyclomine 3 times daily with meals and at bedtime as needed for abdominal cramping  Continue Dexilant for GERD  Okay to restart MiraLAX twice daily if diarrhea has resolved and starting to get constipated  Follow-up office visit in 2 to 3 months  I appreciate the  opportunity to care for you  Thank You   Harl Bowie , MD

## 2018-05-26 NOTE — H&P (View-Only) (Signed)
[name]    [id number]    December 30, 1960  Primary Care Physician:Boyd, Dola Factor, MD  Referring Physician: Bartholome Bill, MD [address] [place], [place] [address]  This service was provided via audio and video telemedicine (Doximity) due to [place] 19 pandemic.  Patient location: Home Provider location: Office Used 2 patient identifiers to confirm the correct person. Explained the limitations in evaluation and management via telemedicine. Patient is aware of potential medical charges for this visit.  Patient consented to this virtual visit.  The persons participating in this telemedicine service were myself and the patient    Chief complaint: Abdominal pain HPI:  58 year old female with morbid obesity,  obstructive sleep apnea, hypertension, diastolic heart failure, last seen in office May 12, 2018.  Colonoscopy April 2019 with removal of 11 mm hamartomatous polyp in sigmoid colon  Colonoscopy November 04, 2003 normal with no polyps  Chronic constipation on MiraLAX twice daily  Chronic GERD was on Protonix, switch to Dexilant last month and started on Pepcid at bedtime  Dicyclomine for IBS  CT abdomen pelvis March 30, 2018: No acute intra-abdominal or pelvic abnormality, moderate fat-containing umbilical hernia  She has been sick since last week, thinks she has the stomach virus Her symptoms started last Wednesday with vomiting for a day, diarrhea continued until this past weekend.  She had low grade fever, chills and myalgia. Abdominal pain generalized improving She feels constipated now, has not had a bowel movement for past 2 days. Decreased appetite with decreased oral intake. Denies any dizziness.  No headaches, loss of smell or taste.      Outpatient Encounter Medications as of 05/26/2018  Medication Sig  . albuterol (PROVENTIL) (2.5 MG/3ML) 0.083% nebulizer solution Take 3 mLs (2.5 mg total) by nebulization every 4  (four) hours as needed for wheezing or shortness of breath.  [name] aspirin 81 MG EC tablet TAKE 1 TABLET BY MOUTH EVERY DAY  . benzonatate (TESSALON) 200 MG capsule Take 1 capsule (200 mg total) by mouth 3 (three) times daily as needed for cough.  . carvedilol (COREG) 12.5 MG tablet Take 1 tablet (12.5 mg total) by mouth 2 (two) times daily with a meal.  . chlorpheniramine (CHLOR-TRIMETON) 4 MG tablet Take 8 mg by mouth at bedtime. May add 1 extra every 4 hours as needed for drainage, drippy nose, throat clearing  . dexlansoprazole (DEXILANT) 60 MG capsule Take 1 capsule (60 mg total) by mouth every morning. Take 30 minutes before breakast  . Dextromethorphan-Guaifenesin (MUCINEX DM MAXIMUM STRENGTH) 60-1200 MG TB12 Take 1 tablet by mouth at bedtime as needed (cough).   . doxycycline (VIBRA-TABS) 100 MG tablet Take 1 tablet (100 mg total) by mouth 2 (two) times daily. (Patient not taking: Reported on 03/30/2018)  . famotidine (PEPCID) 20 MG tablet Take 1 tablet (20 mg total) by mouth daily at 3 pm.  . fluticasone (FLONASE) 50 MCG/ACT nasal spray Place 2 sprays into both nostrils daily.  . hydrALAZINE (APRESOLINE) 25 MG tablet TAKE 1 TABLET BY MOUTH THREE TIMES A DAY  . isosorbide dinitrate (ISORDIL) 20 MG tablet Take 1 tablet (20 mg total) by mouth 3 (three) times daily.  [name] losartan (COZAAR) 50 MG tablet TAKE 1 TABLET BY MOUTH EVERY DAY (Patient taking differently: Take 50 mg by mouth daily. )  . meloxicam (MOBIC) 15 MG tablet Take 15 mg by mouth at bedtime.   . metolazone (ZAROXOLYN) 2.5  MG tablet Take 2.5 mg by mouth daily as needed (FOR OVERNIGHT WEIGHT GAIN OF 3 lbs. or more).  . montelukast (SINGULAIR) 10 MG tablet TAKE 1 TABLET(10 MG) BY MOUTH AT BEDTIME (Patient taking differently: Take 10 mg by mouth at bedtime. )  . ondansetron (ZOFRAN) 4 MG tablet Take 1 tablet (4 mg total) by mouth every 8 (eight) hours as needed for nausea or vomiting.  . polyethylene glycol powder (GLYCOLAX/MIRALAX) powder  Use 2 capfuls in the morning and 1 capful in the evening and as directed (Patient taking differently: Take 17-34 g by mouth every other day. Use 2 capfuls in the morning and 1 capful in the evening)  . potassium chloride SA (K-DUR,KLOR-CON) 20 MEQ tablet TAKE 3 TABLETS (60 MEQ) BY MOUTH THREE TIMES DAILY  . promethazine (PHENERGAN) 25 MG tablet Take 1 tablet (25 mg total) by mouth every 6 (six) hours as needed for nausea or vomiting.  [name] Respiratory Therapy Supplies (FLUTTER) DEVI 1 application by Does not apply route as directed.  [name] spironolactone (ALDACTONE) 25 MG tablet TAKE 1/2 TABLET BY MOUTH TWICE A DAY (Patient taking differently: Take 12.5 mg by mouth 2 (two) times daily. )  . topiramate (TOPAMAX) 25 MG tablet Take 25 mg by mouth 2 (two) times daily as needed (For migraines.).   [name] torsemide (DEMADEX) 20 MG tablet TAKE 2 TABLETS(40 MG) BY MOUTH TWICE DAILY (Patient taking differently: Take 40 mg by mouth 2 (two) times daily. )  . traMADol (ULTRAM) 50 MG tablet Take 1 tablet (50 mg total) by mouth every 6 (six) hours as needed.  [name] UNABLE TO FIND Med Name: CPAP with sleep   No facility-administered encounter medications on file as of 05/26/2018.     Allergies as of 05/26/2018 - Review Complete 05/25/2018  Allergen Reaction Noted  . Sulfonamide derivatives Anaphylaxis, Swelling, Rash, and Other (See Comments) 10/21/2006  . Doxycycline Nausea And Vomiting 01/05/2008  . Latex Hives 09/28/2011  . Ciprofloxacin Rash 10/21/2006  . Fish allergy Rash and Other (See Comments) 03/08/2012  . Hydrocortisone Rash   . Neomycin Rash 10/21/2006    Past Medical History:  Diagnosis Date  . Acute on chronic diastolic CHF (congestive heart failure) ([facility]) 04/11/2014  . Acute suppurative otitis media without spontaneous rupture of eardrum 02/04/2007   Centricity Description: OTITIS MEDIA, SUPPURATIVE, ACUTE, BILATERAL Qualifier: Diagnosis of  By: [name] MD, [name]  Centricity Description: OTITIS MEDIA,  SUPPURATIVE, ACUTE Qualifier: Diagnosis of  By: [name] MD, [name]   . Allergic rhinitis   . Allergy   . Anemia   . Anxiety   . Arthritis   . CAD (coronary artery disease)    LHC (11/29/1999):  EF 60%; no significant CAD.  [name] Carpal tunnel syndrome   . Carpal tunnel syndrome 06/09/2006   Qualifier: Diagnosis of  By: Marca Ancona RMA, Lucy    . Chronic diastolic CHF (congestive heart failure) ([facility])    a. Echo (01/21/11): Vigorous LVF, EF 65-70%, no RWMA, Gr 2 DD. b.  Echo (12/15): Moderate LVH, EF 67-20%, grade 2 diastolic dysfunction, mild LAE, normal RV function c. 01/2015: echo showing EF of 60-65% with mild LVH  . Common migraine   . COPD (chronic obstructive pulmonary disease) ([facility])   . Diastolic heart failure ([place])   . Diverticulitis   . GERD (gastroesophageal reflux disease)   . Hepatic steatosis   . Hepatomegaly   . Hiatal hernia   . Hyperlipidemia   . Hypertension   . IBS (irritable  bowel syndrome)   . Internal hemorrhoids   . Morbid obesity ([facility])   . OSA (obstructive sleep apnea)    CPAP dependent  . PNEUMONIA, ORGAN UNSPECIFIED 03/01/2009   Qualifier: Diagnosis of  By: [name] CMA, [name]    . Sinusitis, chronic 03/10/2012   CT sinuses 02/2012:  Chronic sinusitis with small A/F levels >> rx by ENT with prolonged augmentin F/u ct sinuses 04/2012:  Persistent sinus thickening >> rx with levaquin and clinda for 30 days.  Told to return for sinus scan in 6 weeks >> no showed for followup visit.  CT sinuses 11/2013:  Acute and chronic sinusitis noted.    . Sleep apnea    bi pap    Past Surgical History:  Procedure Laterality Date  . ABDOMINAL HYSTERECTOMY    . CESAREAN SECTION    . COLONOSCOPY    . COLONOSCOPY WITH PROPOFOL N/A 05/05/2017   Procedure: COLONOSCOPY WITH PROPOFOL;  Surgeon: Mauri Pole, MD;  Location: [facility];  Service: Endoscopy;  Laterality: N/A;  . NASAL SINUS SURGERY    . PARTIAL HYSTERECTOMY    . TONSILLECTOMY    . TUBAL LIGATION    .  UVULOPALATOPHARYNGOPLASTY      Family History  Problem Relation Age of Onset  . Heart disease Mother   . Heart attack Mother   . Hypertension Mother   . Coronary artery disease Other        1st degree relatvie <50  . Breast cancer Other        aunt- ? paternal or maternal  . Asthma Sister   . Colon polyps Sister   . Stroke Neg Hx   . Colon cancer Neg Hx   . Esophageal cancer Neg Hx   . Rectal cancer Neg Hx   . Stomach cancer Neg Hx     Social History   Socioeconomic History  . Marital status: Widowed    Spouse name: Not on file  . Number of children: 2  . Years of education: Not on file  . Highest education level: Not on file  Occupational History  . Occupation: Retired  Scientific laboratory technician  . Financial resource strain: Not on file  . Food insecurity:    Worry: Not on file    Inability: Not on file  . Transportation needs:    Medical: Not on file    Non-medical: Not on file  Tobacco Use  . Smoking status: Never Smoker  . Smokeless tobacco: Never Used  Substance and Sexual Activity  . Alcohol use: No  . Drug use: No  . Sexual activity: Not on file  Lifestyle  . Physical activity:    Days per week: Not on file    Minutes per session: Not on file  . Stress: Not on file  Relationships  . Social connections:    Talks on phone: Not on file    Gets together: Not on file    Attends religious service: Not on file    Active member of club or organization: Not on file    Attends meetings of clubs or organizations: Not on file    Relationship status: Not on file  . Intimate partner violence:    Fear of current or ex partner: Not on file    Emotionally abused: Not on file    Physically abused: Not on file    Forced sexual activity: Not on file  Other Topics Concern  . Not on file  Social History Narrative  Charity fundraiser, Oceanographer.   Widowed, lives with son.      Review of systems: Review of Systems as per HPI All other systems reviewed and are  negative.   Physical Exam: Vitals were not taken and physical exam was not performed during this virtual visit.  Data Reviewed:  Reviewed labs, radiology imaging, old records and pertinent past GI work up   Assessment and Plan/Recommendations:  58 year old female with morbid obesity, hypertension, hyperlipidemia, chronic kidney disease, diastolic heart failure, COPD, chronic GERD, constipation and IBS with fever, myalgia, chills, nausea, vomiting and diarrhea concerning for acute viral infection, ?  SARS-CoV-2.  Denies any exposure to anyone with COVID-19.  She has not stepped outside her house since mid March.  Advised patient to continue social distancing and to self isolate Given her symptoms are improving and sporadic community testing, will not be able to test her.  Continue Dexilant for GERD symptoms Antireflux measures  She is starting to get constipated, restart MiraLAX twice daily, titrate as needed to have 1-2 soft bowel movements  Continue dicyclomine up to 4 times daily as needed for abdominal cramping  Maintain adequate fluid intake to prevent dehydration Small frequent meals  Follow-up in 2 to 3 months or sooner if needed    K. Denzil Magnuson , MD   CC: Bartholome Bill, MD

## 2018-05-27 ENCOUNTER — Telehealth: Payer: Self-pay | Admitting: Gastroenterology

## 2018-05-27 NOTE — Telephone Encounter (Signed)
Patient called said that she is still having diarrhea this morning and would to know if she can have a medication sent it. I did tell her that she had some medication sent to [facility] and she said she was not aware of it. She would like to speak to someone.

## 2018-05-28 NOTE — Telephone Encounter (Signed)
Returned pts call and she asked if I was the doctor. Informed pt that this is the doctors nurse. Pt states she only wants to speak to the doctor. Dr. Silverio Decamp notified.

## 2018-05-28 NOTE — Telephone Encounter (Signed)
She saw nephrologist yesterday, was supposed to do blood work but she refused to send patient for labs and advised her to contact us for COVID-19 (coronavirus) testing because she had diarrhea this past weekend. Patient is extremely worried. Diarrhea has since resolved, no BM today. She is tolerating PO intake. I reassured patient and advised her to self isolate and maintain social distancing as she is already doing.  Unfortunately our office doesn't have the capability to do coronavirus testing.

## 2018-06-02 ENCOUNTER — Emergency Department (HOSPITAL_COMMUNITY)
Admission: EM | Admit: 2018-06-02 | Discharge: 2018-06-02 | Disposition: A | Discharge status: Home / Self Care | Payer: 59 | Attending: Emergency Medicine | Admitting: Emergency Medicine

## 2018-06-02 ENCOUNTER — Telehealth: Payer: Self-pay | Admitting: Cardiovascular Disease

## 2018-06-02 ENCOUNTER — Other Ambulatory Visit: Payer: Self-pay

## 2018-06-02 ENCOUNTER — Encounter (HOSPITAL_COMMUNITY): Payer: Self-pay

## 2018-06-02 ENCOUNTER — Emergency Department (HOSPITAL_COMMUNITY): Payer: 59

## 2018-06-02 DIAGNOSIS — Z20828 Contact with and (suspected) exposure to other viral communicable diseases: Secondary | ICD-10-CM | POA: Insufficient documentation

## 2018-06-02 DIAGNOSIS — K2900 Acute gastritis without bleeding: Secondary | ICD-10-CM

## 2018-06-02 DIAGNOSIS — R0602 Shortness of breath: Secondary | ICD-10-CM | POA: Insufficient documentation

## 2018-06-02 DIAGNOSIS — K529 Noninfective gastroenteritis and colitis, unspecified: Secondary | ICD-10-CM | POA: Diagnosis not present

## 2018-06-02 DIAGNOSIS — Z91040 Latex allergy status: Secondary | ICD-10-CM | POA: Insufficient documentation

## 2018-06-02 DIAGNOSIS — I110 Hypertensive heart disease with heart failure: Secondary | ICD-10-CM | POA: Insufficient documentation

## 2018-06-02 DIAGNOSIS — I2510 Atherosclerotic heart disease of native coronary artery without angina pectoris: Secondary | ICD-10-CM | POA: Insufficient documentation

## 2018-06-02 DIAGNOSIS — Z7982 Long term (current) use of aspirin: Secondary | ICD-10-CM | POA: Diagnosis not present

## 2018-06-02 DIAGNOSIS — I5032 Chronic diastolic (congestive) heart failure: Secondary | ICD-10-CM | POA: Diagnosis not present

## 2018-06-02 DIAGNOSIS — R0789 Other chest pain: Secondary | ICD-10-CM | POA: Diagnosis present

## 2018-06-02 DIAGNOSIS — Z79899 Other long term (current) drug therapy: Secondary | ICD-10-CM | POA: Diagnosis not present

## 2018-06-02 DIAGNOSIS — E876 Hypokalemia: Secondary | ICD-10-CM | POA: Diagnosis not present

## 2018-06-02 LAB — URINALYSIS, ROUTINE W REFLEX MICROSCOPIC
Bilirubin Urine: NEGATIVE
Glucose, UA: NEGATIVE mg/dL
Hgb urine dipstick: NEGATIVE
Ketones, ur: NEGATIVE mg/dL
Leukocytes,Ua: NEGATIVE
Nitrite: NEGATIVE
Protein, ur: NEGATIVE mg/dL
Specific Gravity, Urine: 1.008 (ref 1.005–1.030)
pH: 7 (ref 5.0–8.0)

## 2018-06-02 LAB — BRAIN NATRIURETIC PEPTIDE: B Natriuretic Peptide: 26.5 pg/mL (ref 0.0–100.0)

## 2018-06-02 LAB — SARS CORONAVIRUS 2 BY RT PCR (HOSPITAL ORDER, PERFORMED IN ~~LOC~~ HOSPITAL LAB): SARS Coronavirus 2: NEGATIVE

## 2018-06-02 LAB — CBC
HCT: 37.9 % (ref 36.0–46.0)
Hemoglobin: 11.9 g/dL — ABNORMAL LOW (ref 12.0–15.0)
MCH: 30.8 pg (ref 26.0–34.0)
MCHC: 31.4 g/dL (ref 30.0–36.0)
MCV: 98.2 fL (ref 80.0–100.0)
Platelets: 216 10*3/uL (ref 150–400)
RBC: 3.86 MIL/uL — ABNORMAL LOW (ref 3.87–5.11)
RDW: 13.6 % (ref 11.5–15.5)
WBC: 9.5 10*3/uL (ref 4.0–10.5)
nRBC: 0 % (ref 0.0–0.2)

## 2018-06-02 LAB — COMPREHENSIVE METABOLIC PANEL
ALT: 15 U/L (ref 0–44)
AST: 18 U/L (ref 15–41)
Albumin: 2.5 g/dL — ABNORMAL LOW (ref 3.5–5.0)
Alkaline Phosphatase: 59 U/L (ref 38–126)
Anion gap: 6 (ref 5–15)
BUN: 10 mg/dL (ref 6–20)
CO2: 21 mmol/L — ABNORMAL LOW (ref 22–32)
Calcium: 7.1 mg/dL — ABNORMAL LOW (ref 8.9–10.3)
Chloride: 113 mmol/L — ABNORMAL HIGH (ref 98–111)
Creatinine, Ser: 0.66 mg/dL (ref 0.44–1.00)
GFR calc Af Amer: >60 mL/min (ref >60)
GFR calc non Af Amer: >60 mL/min (ref >60)
Glucose, Bld: 88 mg/dL (ref 70–99)
Potassium: 2.7 mmol/L — CL (ref 3.5–5.1)
Sodium: 140 mmol/L (ref 135–145)
Total Bilirubin: 0.7 mg/dL (ref 0.3–1.2)
Total Protein: 5.9 g/dL — ABNORMAL LOW (ref 6.5–8.1)

## 2018-06-02 LAB — TROPONIN I: Troponin I: <0.03 ng/mL (ref <0.03)

## 2018-06-02 LAB — LIPASE, BLOOD: Lipase: 36 U/L (ref 11–51)

## 2018-06-02 MED ORDER — LACTATED RINGERS IV BOLUS
500.0000 mL | Freq: Once | INTRAVENOUS | Status: AC
  Administered 2018-06-02: 19:00:00 500 mL via INTRAVENOUS

## 2018-06-02 MED ORDER — POTASSIUM CHLORIDE CRYS ER 20 MEQ PO TBCR: 40.0000 meq | EXTENDED_RELEASE_TABLET | Freq: Two times a day (BID) | ORAL | 0 refills | Status: DC

## 2018-06-02 MED ORDER — SODIUM CHLORIDE 0.9% FLUSH
3.0000 mL | Freq: Once | INTRAVENOUS | Status: AC
  Administered 2018-06-02: 3 mL via INTRAVENOUS

## 2018-06-02 MED ORDER — SODIUM CHLORIDE 0.9 % IV SOLN
1.0000 g | Freq: Once | INTRAVENOUS | Status: AC
  Administered 2018-06-02: 20:00:00 1 g via INTRAVENOUS
  Filled 2018-06-02: qty 10

## 2018-06-02 MED ORDER — ALUM & MAG HYDROXIDE-SIMETH 200-200-20 MG/5ML PO SUSP
30.0000 mL | Freq: Once | ORAL | Status: AC
  Administered 2018-06-02: 17:00:00 30 mL via ORAL
  Filled 2018-06-02: qty 30

## 2018-06-02 MED ORDER — POTASSIUM CHLORIDE CRYS ER 20 MEQ PO TBCR
40.0000 meq | EXTENDED_RELEASE_TABLET | Freq: Once | ORAL | Status: AC
  Administered 2018-06-02: 40 meq via ORAL
  Filled 2018-06-02: qty 2

## 2018-06-02 MED ORDER — FAMOTIDINE IN NACL 20-0.9 MG/50ML-% IV SOLN
20.0000 mg | Freq: Once | INTRAVENOUS | Status: DC
  Filled 2018-06-02: qty 50

## 2018-06-02 MED ORDER — DICYCLOMINE HCL 10 MG PO CAPS: 10.0000 mg | ORAL_CAPSULE | Freq: Three times a day (TID) | ORAL | 0 refills | Status: DC

## 2018-06-02 MED ORDER — LIDOCAINE VISCOUS HCL 2 % MT SOLN
15.0000 mL | Freq: Once | OROMUCOSAL | Status: AC
  Administered 2018-06-02: 15 mL via ORAL
  Filled 2018-06-02: qty 15

## 2018-06-02 MED ORDER — DICYCLOMINE HCL 10 MG/5ML PO SOLN
10.0000 mg | Freq: Once | ORAL | Status: AC
  Administered 2018-06-02: 10 mg via ORAL
  Filled 2018-06-02: qty 5

## 2018-06-02 MED ORDER — PANTOPRAZOLE SODIUM 20 MG PO TBEC: 20.0000 mg | DELAYED_RELEASE_TABLET | Freq: Every day | ORAL | 0 refills | Status: DC

## 2018-06-02 MED ORDER — POTASSIUM CHLORIDE 10 MEQ/100ML IV SOLN
10.0000 meq | INTRAVENOUS | Status: AC
  Administered 2018-06-02 (×2): 10 meq via INTRAVENOUS
  Filled 2018-06-02 (×2): qty 100

## 2018-06-02 MED ORDER — SUCRALFATE 1 G PO TABS: 1.0000 g | ORAL_TABLET | Freq: Three times a day (TID) | ORAL | 0 refills | Status: DC

## 2018-06-02 MED ORDER — POTASSIUM CHLORIDE CRYS ER 20 MEQ PO TBCR
20.0000 meq | EXTENDED_RELEASE_TABLET | Freq: Once | ORAL | Status: AC
  Administered 2018-06-02: 20 meq via ORAL
  Filled 2018-06-02: qty 1

## 2018-06-02 MED ORDER — LOPERAMIDE HCL 2 MG PO CAPS: 2.0000 mg | ORAL_CAPSULE | Freq: Four times a day (QID) | ORAL | 0 refills | Status: DC | PRN

## 2018-06-02 NOTE — ED Provider Notes (Signed)
[facility] DEPT Provider Note   CSN: [id number] Arrival date & time: 06/02/18  1539    History   Chief Complaint Chief Complaint  Patient presents with   Abdominal Pain   Diarrhea    HPI [name] is a 58 y.o. female.     HPI  58 year old female with past medical history of diastolic heart failure, coronary disease, CHF, hypertension, hyperlipidemia, here with multiple complaints.  Patient's primary complaint is a burning, dull, substernal chest pressure along with an epigastric discomfort.  This seems to worsen after eating.  Her symptoms began actually approximately a month ago.  At that time, she had had a cough that lasted several weeks and she was given prednisone.  While this seemed to reportedly improve her shortness of breath, she then developed this substernal chest pressure which has persisted.  She has had no leg swelling.  No increased shortness of breath or recurrent cough.  She describes the pain as an aching, burning sensation.  Is not associated with edema.  No diaphoresis.  She also has had persistent diarrhea for the last week.  No blood in her diarrhea.  She has a history of diverticulitis and states symptoms feel somewhat similar to that.  No other medical complaints.   Past Medical History:  Diagnosis Date   Acute on chronic diastolic CHF (congestive heart failure) ([place]) 04/11/2014   Acute suppurative otitis media without spontaneous rupture of eardrum 02/04/2007   Centricity Description: OTITIS MEDIA, SUPPURATIVE, ACUTE, BILATERAL Qualifier: Diagnosis of  By: [name] MD, [name]  Centricity Description: OTITIS MEDIA, SUPPURATIVE, ACUTE Qualifier: Diagnosis of  By: [name] MD, Sean A    Allergic rhinitis    Allergy    Anemia    Anxiety    Arthritis    CAD (coronary artery disease)    LHC (11/29/1999):  EF 60%; no significant CAD.   Carpal tunnel syndrome    Carpal tunnel syndrome 06/09/2006   Qualifier: Diagnosis  of  By: Marca Ancona RMA, Lucy     Chronic diastolic CHF (congestive heart failure) ([place])    a. Echo (01/21/11): Vigorous LVF, EF 65-70%, no RWMA, Gr 2 DD. b.  Echo (12/15): Moderate LVH, EF 86-76%, grade 2 diastolic dysfunction, mild LAE, normal RV function c. 01/2015: echo showing EF of 60-65% with mild LVH   Common migraine    COPD (chronic obstructive pulmonary disease) (H[facility])    Diastolic heart failure (H[facility])    Diverticulitis    GERD (gastroesophageal reflux disease)    Hepatic steatosis    Hepatomegaly    Hiatal hernia    Hyperlipidemia    Hypertension    IBS (irritable bowel syndrome)    Internal hemorrhoids    Morbid obesity ([place])    OSA (obstructive sleep apnea)    CPAP dependent   PNEUMONIA, ORGAN UNSPECIFIED 03/01/2009   Qualifier: Diagnosis of  By: [name] CMA, Jennifer     Sinusitis, chronic 03/10/2012   CT sinuses 02/2012:  Chronic sinusitis with small A/F levels >> rx by ENT with prolonged augmentin F/u ct sinuses 04/2012:  Persistent sinus thickening >> rx with levaquin and clinda for 30 days.  Told to return for sinus scan in 6 weeks >> no showed for followup visit.  CT sinuses 11/2013:  Acute and chronic sinusitis noted.     Sleep apnea    bi pap    Patient Active Problem List   Diagnosis Date Noted   Bronchitis 02/12/2018   Polyp of sigmoid  colon    CKD (chronic kidney disease) stage 3, GFR 30-59 ml/min (HCC) 07/08/2016   Cough variant asthma 07/31/2015   Upper airway cough syndrome 04/11/2015   Bronchitis, chronic obstructive w acute bronchitis ([place]) 04/11/2015   Dyspnea    Acute on chronic diastolic CHF (congestive heart failure), NYHA class 2 ([place]) 03/27/2015   Morbid obesity due to excess calories ([place]) 03/27/2015   Dysphagia 03/27/2015   Hyponatremia 03/27/2015   Acute kidney injury ([place]) 03/27/2015   Hypokalemia 03/27/2015   Leukocytosis 03/27/2015   COPD exacerbation ([place]) 03/27/2015   Sinusitis, chronic 03/10/2012    Hyperlipidemia 10/02/2006   Obstructive sleep apnea 06/09/2006   Essential hypertension 06/09/2006    Past Surgical History:  Procedure Laterality Date   ABDOMINAL HYSTERECTOMY     CESAREAN SECTION     COLONOSCOPY     COLONOSCOPY WITH PROPOFOL N/A 05/05/2017   Procedure: COLONOSCOPY WITH PROPOFOL;  Surgeon: Mauri Pole, MD;  Location: [facility];  Service: Endoscopy;  Laterality: N/A;   NASAL SINUS SURGERY     PARTIAL HYSTERECTOMY     TONSILLECTOMY     TUBAL LIGATION     UVULOPALATOPHARYNGOPLASTY       OB History   No obstetric history on file.      Home Medications    Prior to Admission medications   Medication Sig Start Date End Date Taking? Authorizing Provider  albuterol (PROVENTIL) (2.5 MG/3ML) 0.083% nebulizer solution Take 3 mLs (2.5 mg total) by nebulization every 4 (four) hours as needed for wheezing or shortness of breath. 05/01/17  Yes Tanda Rockers, MD  aspirin 81 MG EC tablet TAKE 1 TABLET BY MOUTH EVERY DAY 04/22/18  Yes Skeet Latch, MD  benzonatate (TESSALON) 200 MG capsule Take 1 capsule (200 mg total) by mouth 3 (three) times daily as needed for cough. 10/13/17  Yes Chesley Mires, MD  brompheniramine-pseudoephedrine-DM 30-2-10 MG/5ML syrup Take 5 mLs by mouth 4 (four) times daily as needed (cough and allergies).  05/11/18  Yes [provider]  carvedilol (COREG) 12.5 MG tablet Take 1 tablet (12.5 mg total) by mouth 2 (two) times daily with a meal. 01/22/18  Yes Skeet Latch, MD  chlorpheniramine (CHLOR-TRIMETON) 4 MG tablet Take 8 mg by mouth at bedtime. May add 1 extra every 4 hours as needed for drainage, drippy nose, throat clearing   Yes [provider]  dexlansoprazole (DEXILANT) 60 MG capsule Take 1 capsule (60 mg total) by mouth every morning. Take 30 minutes before breakast 05/12/18  Yes Nandigam, Venia Minks, MD  Dextromethorphan-Guaifenesin (MUCINEX DM MAXIMUM STRENGTH) 60-1200 MG TB12 Take 1 tablet by mouth at  bedtime as needed (cough).    Yes [provider]  famotidine (PEPCID) 20 MG tablet Take 1 tablet (20 mg total) by mouth daily at 3 pm. 06/12/17  Yes Tanda Rockers, MD  fluticasone (FLONASE) 50 MCG/ACT nasal spray Place 2 sprays into both nostrils daily. Patient taking differently: Place 2 sprays into both nostrils daily as needed for allergies.  05/08/18  Yes Chesley Mires, MD  hydrALAZINE (APRESOLINE) 25 MG tablet TAKE 1 TABLET BY MOUTH THREE TIMES A DAY 05/11/18  Yes Skeet Latch, MD  isosorbide dinitrate (ISORDIL) 20 MG tablet Take 1 tablet (20 mg total) by mouth 3 (three) times daily. 02/20/18  Yes Skeet Latch, MD  losartan (COZAAR) 25 MG tablet Take 50 mg by mouth daily. 04/02/18  Yes [provider]  meloxicam (MOBIC) 15 MG tablet Take 15 mg by mouth daily  as needed for pain.  03/31/17  Yes [provider]  montelukast (SINGULAIR) 10 MG tablet TAKE 1 TABLET(10 MG) BY MOUTH AT BEDTIME Patient taking differently: Take 10 mg by mouth at bedtime.  07/25/17  Yes Tanda Rockers, MD  ondansetron (ZOFRAN) 4 MG tablet Take 1 tablet (4 mg total) by mouth every 8 (eight) hours as needed for nausea or vomiting. 03/30/18  Yes [facility], Abigail, PA-C  polyethylene glycol powder (GLYCOLAX/MIRALAX) powder Use 2 capfuls in the morning and 1 capful in the evening and as directed Patient taking differently: Take 17-34 g by mouth daily as needed for moderate constipation.  03/24/17  Yes Nandigam, Venia Minks, MD  potassium chloride SA (K-DUR,KLOR-CON) 20 MEQ tablet TAKE 3 TABLETS (60 MEQ) BY MOUTH THREE TIMES DAILY Patient taking differently: Take 180 mEq by mouth daily.  04/27/18  Yes Skeet Latch, MD  promethazine (PHENERGAN) 25 MG tablet Take 1 tablet (25 mg total) by mouth every 6 (six) hours as needed for nausea or vomiting. 04/18/17  Yes Lawyer, Harrell Gave, PA-C  spironolactone (ALDACTONE) 25 MG tablet TAKE 1/2 TABLET BY MOUTH TWICE A DAY Patient taking differently: Take 12.5 mg  by mouth 2 (two) times daily.  12/02/17  Yes Skeet Latch, MD  torsemide (DEMADEX) 20 MG tablet TAKE 2 TABLETS(40 MG) BY MOUTH TWICE DAILY Patient taking differently: Take 40 mg by mouth 2 (two) times daily.  06/02/17  Yes Skeet Latch, MD  traMADol (ULTRAM) 50 MG tablet Take 1 tablet (50 mg total) by mouth every 6 (six) hours as needed. Patient taking differently: Take 50 mg by mouth every 6 (six) hours as needed for moderate pain.  03/30/18  Yes [facility], Abigail, PA-C  dicyclomine (BENTYL) 10 MG capsule Take 1 capsule (10 mg total) by mouth 3 (three) times daily before meals for 5 days. 06/02/18 06/07/18  Duffy Bruce, MD  doxycycline (VIBRA-TABS) 100 MG tablet Take 1 tablet (100 mg total) by mouth 2 (two) times daily. Patient not taking: Reported on 06/02/2018 03/11/18   Tanda Rockers, MD  loperamide (IMODIUM) 2 MG capsule Take 1 capsule (2 mg total) by mouth 4 (four) times daily as needed for diarrhea or loose stools. 06/02/18   Duffy Bruce, MD  losartan (COZAAR) 50 MG tablet TAKE 1 TABLET BY MOUTH EVERY DAY Patient not taking: No sig reported 12/01/17   Skeet Latch, MD  metolazone (ZAROXOLYN) 2.5 MG tablet Take 2.5 mg by mouth daily as needed (FOR OVERNIGHT WEIGHT GAIN OF 3 lbs. or more).    [provider]  pantoprazole (PROTONIX) 20 MG tablet Take 1 tablet (20 mg total) by mouth daily for 14 days. 06/02/18 06/16/18  Duffy Bruce, MD  potassium chloride SA (K-DUR) 20 MEQ tablet Take 2 tablets (40 mEq total) by mouth 2 (two) times daily for 3 days. In addition to your usual supplement 06/02/18 06/05/18  Duffy Bruce, MD  Respiratory Therapy Supplies (FLUTTER) DEVI 1 application by Does not apply route as directed. 12/27/16   Tanda Rockers, MD  sucralfate (CARAFATE) 1 g tablet Take 1 tablet (1 g total) by mouth 4 (four) times daily -  with meals and at bedtime for 5 days. 06/02/18 06/07/18  Duffy Bruce, MD  tiZANidine (ZANAFLEX) 4 MG tablet Take 4 mg by mouth every 6  (six) hours as needed for muscle spasms.  04/09/18   [provider]  topiramate (TOPAMAX) 25 MG tablet Take 25 mg by mouth 2 (two) times daily as needed (For migraines.).  [provider]  UNABLE TO FIND Med Name: CPAP with sleep    [provider]    Family History Family History  Problem Relation Age of Onset   Heart disease Mother    Heart attack Mother    Hypertension Mother    Coronary artery disease Other        1st degree relatvie <50   Breast cancer Other        aunt- ? paternal or maternal   Asthma Sister    Colon polyps Sister    Stroke Neg Hx    Colon cancer Neg Hx    Esophageal cancer Neg Hx    Rectal cancer Neg Hx    Stomach cancer Neg Hx     Social History Social History   Tobacco Use   Smoking status: Never Smoker   Smokeless tobacco: Never Used  Substance Use Topics   Alcohol use: No   Drug use: No     Allergies   Sulfonamide derivatives; Doxycycline; Latex; Ciprofloxacin; Fish allergy; Hydrocortisone; and Neomycin   Review of Systems Review of Systems  Constitutional: Positive for fatigue. Negative for chills and fever.  HENT: Negative for congestion and rhinorrhea.   Eyes: Negative for visual disturbance.  Respiratory: Positive for cough and shortness of breath. Negative for wheezing.   Cardiovascular: Negative for chest pain and leg swelling.  Gastrointestinal: Positive for abdominal pain, diarrhea, nausea and vomiting.  Genitourinary: Negative for dysuria and flank pain.  Musculoskeletal: Negative for neck pain and neck stiffness.  Skin: Negative for rash and wound.  Allergic/Immunologic: Negative for immunocompromised state.  Neurological: Positive for weakness. Negative for syncope and headaches.  All other systems reviewed and are negative.    Physical Exam Updated Vital Signs BP 118/67    Pulse 81    Temp 98.7 F (37.1 C) (Oral)    Resp 18    Ht 5\' 3"  (1.6 m)    Wt 131.5 kg    SpO2 95%     BMI 51.37 kg/m   Physical Exam Vitals signs and nursing note reviewed.  Constitutional:      General: She is not in acute distress.    Appearance: She is well-developed.  HENT:     Head: Normocephalic and atraumatic.  Eyes:     Conjunctiva/sclera: Conjunctivae normal.  Neck:     Musculoskeletal: Neck supple.  Cardiovascular:     Rate and Rhythm: Normal rate and regular rhythm.     Heart sounds: Normal heart sounds. No murmur. No friction rub.  Pulmonary:     Effort: Pulmonary effort is normal. No respiratory distress.     Breath sounds: Normal breath sounds. No wheezing or rales.  Abdominal:     General: Abdomen is flat. There is no distension.     Palpations: Abdomen is soft.     Tenderness: There is generalized abdominal tenderness and tenderness in the left lower quadrant. There is no guarding or rebound.  Skin:    General: Skin is warm.     Capillary Refill: Capillary refill takes less than 2 seconds.  Neurological:     Mental Status: She is alert and oriented to person, place, and time.     Motor: No abnormal muscle tone.      ED Treatments / Results  Labs (all labs ordered are listed, but only abnormal results are displayed) Labs Reviewed  CBC - Abnormal; Notable for the following components:      Result Value   RBC 3.86 (*)  Hemoglobin 11.9 (*)    All other components within normal limits  COMPREHENSIVE METABOLIC PANEL - Abnormal; Notable for the following components:   Potassium 2.7 (*)    Chloride 113 (*)    CO2 21 (*)    Calcium 7.1 (*)    Total Protein 5.9 (*)    Albumin 2.5 (*)    All other components within normal limits  SARS CORONAVIRUS 2 (HOSPITAL ORDER, [facility])  TROPONIN I  URINALYSIS, ROUTINE W REFLEX MICROSCOPIC  BRAIN NATRIURETIC PEPTIDE  LIPASE, BLOOD    EKG EKG Interpretation  Date/Time:  Tuesday Jun 02 2018 15:58:33 EDT Ventricular Rate:  65 PR Interval:    QRS Duration: 92 QT Interval:  385 QTC  Calculation: 401 R Axis:   96 Text Interpretation:  Sinus rhythm Borderline right axis deviation Low voltage, extremity and precordial leads Baseline wander in lead(s) II No significant change since last tracing Confirmed by Julianne Rice [phone]) on 06/02/2018 4:01:51 PM   Radiology Ct Abdomen Pelvis Wo Contrast  Result Date: 06/02/2018 CLINICAL DATA:  Abdominal pain. Right lower quadrant abdominal pain. Diverticulitis suspected. EXAM: CT ABDOMEN AND PELVIS WITHOUT CONTRAST TECHNIQUE: Multidetector CT imaging of the abdomen and pelvis was performed following the standard protocol without IV contrast. COMPARISON:  CT and pelvis dated 03/30/2018 FINDINGS: Lower chest: No acute abnormality. Hepatobiliary: No focal liver abnormality is seen. No gallstones, gallbladder wall thickening, or biliary dilatation. Pancreas: Unremarkable. No pancreatic ductal dilatation or surrounding inflammatory changes. Spleen: Normal in size without focal abnormality. Adrenals/Urinary Tract: Adrenal glands are unremarkable. Kidneys are normal, without renal calculi, focal lesion, or hydronephrosis. Bladder is unremarkable. Stomach/Bowel: Stomach is within normal limits. Appendix appears normal. No evidence of bowel wall thickening, distention, or inflammatory changes. Vascular/Lymphatic: No significant vascular findings are present. No enlarged abdominal or pelvic lymph nodes. Reproductive: Status post hysterectomy. No adnexal masses. Other: Again identified is a moderate size fat containing periumbilical hernia. Musculoskeletal: No acute or significant osseous findings. IMPRESSION: No acute intra-abdominal abnormality detected. No findings to explain the patient's abdominal pain. Electronically Signed   By: Constance Holster M.D.   On: 06/02/2018 19:08   Dg Chest Portable 1 View  Result Date: 06/02/2018 CLINICAL DATA:  Cough and shortness of breath with chest pain EXAM: PORTABLE CHEST 1 VIEW COMPARISON:  March 30, 2018  FINDINGS: There is no edema or consolidation. Heart is mildly enlarged with pulmonary vascularity normal. No adenopathy. There is degenerative change in each shoulder. IMPRESSION: Mild cardiac enlargement.  No edema or consolidation. Electronically Signed   By: Lowella Grip III M.D.   On: 06/02/2018 17:30    Procedures Procedures (including critical care time)  Medications Ordered in ED Medications  calcium gluconate 1 g in sodium chloride 0.9 % 100 mL IVPB (1 g Intravenous New Bag/Given 06/02/18 2011)  potassium chloride 10 mEq in 100 mL IVPB (10 mEq Intravenous New Bag/Given 06/02/18 2003)  famotidine (PEPCID) IVPB 20 mg premix (has no administration in time range)  potassium chloride SA (K-DUR) CR tablet 20 mEq (has no administration in time range)  sodium chloride flush (NS) 0.9 % injection 3 mL (3 mLs Intravenous Given 06/02/18 1700)  dicyclomine (BENTYL) 10 MG/5ML syrup 10 mg (10 mg Oral Given 06/02/18 1716)  alum & mag hydroxide-simeth (MAALOX/MYLANTA) 200-200-20 MG/5ML suspension 30 mL (30 mLs Oral Given 06/02/18 1717)    And  lidocaine (XYLOCAINE) 2 % viscous mouth solution 15 mL (15 mLs Oral Given 06/02/18 1717)  potassium chloride  SA (K-DUR) CR tablet 40 mEq (40 mEq Oral Given 06/02/18 1908)  lactated ringers bolus 500 mL (0 mLs Intravenous Stopped 06/02/18 2019)     Initial Impression / Assessment and Plan / ED Course  I have reviewed the triage vital signs and the nursing notes.  Pertinent labs & imaging results that were available during my care of the patient were reviewed by me and considered in my medical decision making (see chart for details).  Clinical Course as of Jun 01 2017  Tue Jun 02, 2018  1655 58 yo F here with multiple complaints. Primary complaint is epigastric and substernal chest burning, present since steroid taper weeks ago but worsening now. No L-sided pain, SOB, diaphoresis, or sx to suggest angina but will check Trop, EKG. Suspect this is gastritis/PUD  2/2 steroids in pt with known GERD. DDx also includes viral GI illness, also diverticulitis w/ atypical pain given h/o same. WIll check CT, labs, re-assess. GI cocktail, bentyl given. No fever, pt is otherwise wlel appearing and in NAD.   [CI]  1820 Labs reviewed. Trop neg despite constant sx >12 hours - doubt ACS. BNP normal, doubt CHF. No unilateral leg swelling, tachycardia, hypoxia or signs of PE. I suspect this is all related to her vomiting/GERD/gastritis.    [CI]  2005 Labs as above. Pt with acute on chronic hypokalemia. No U waves, no abnormal intervals on CT. Hgb is at baseline. Lipase, LFTs, renal function all normal. CT scan negative. CXR is clear. Sx markedly improved with GI cocktail. Will d/c on bentyl, antacids, immodium. Potassiuim replaced here and will increase her home replacement x 3 days. I had a long discussion with pt and daughter via telephone - will f/u with her GI doc.   [CI]    Clinical Course User Index [CI] Duffy Bruce, MD       Final Clinical Impressions(s) / ED Diagnoses   Final diagnoses:  Colitis  Acute superficial gastritis without hemorrhage  Hypokalemia    ED Discharge Orders         Ordered    pantoprazole (PROTONIX) 20 MG tablet  Daily     06/02/18 1951    sucralfate (CARAFATE) 1 g tablet  3 times daily with meals & bedtime     06/02/18 1951    dicyclomine (BENTYL) 10 MG capsule  3 times daily before meals     06/02/18 1951    loperamide (IMODIUM) 2 MG capsule  4 times daily PRN     06/02/18 1953    potassium chloride SA (K-DUR) 20 MEQ tablet  2 times daily     06/02/18 1953           Duffy Bruce, MD 06/02/18 2019

## 2018-06-02 NOTE — Telephone Encounter (Signed)
Pt c/o of Chest Pain: STAT if CP now or developed within 24 hours  1. Are you having CP right now? no  2. Are you experiencing any other symptoms (ex. SOB, nausea, vomiting, sweating)? no  3. How long have you been experiencing CP? Since 12pm today  4. Is your CP continuous or coming and going? Comes and goes  5. Have you taken Nitroglycerin? No  Patient woke up about noon with chest pain that comes and goes and a slight headache. She also states that she feels sluggish and tired. She thought she had acid reflux and she took medicine for that but she still has pain off and on.  ?

## 2018-06-02 NOTE — Telephone Encounter (Signed)
Patient called and reported that she woke up at 6 am and had a headache and chest discomfort. She went back to sleep and woke up at noon and has been experiencing chest pain on and off since noon. Patient also reported that she was feeling very sluggish and tired and is currently having "light" chest pain now. Patient was advised to go to the ER to be evaluated. Wannetta Sender was notified of the patient being sent to the ER.

## 2018-06-02 NOTE — Discharge Instructions (Addendum)
For your gastritis/colitis: - Take the protonix for at least 5 days, ideally 14 days, first thing in the morning, 30 minutes before eating/drinking - Take Carafate tablets as prescribed - Continue your Pepcid - Take Bentyl as prescribed  - Use immodium as needed  For your potassium: - In addition to your usual supplements, take an extra 40 mEq twice a day - this has been prescribed  For your abdominal pain: - Follow the recommended diet

## 2018-06-02 NOTE — ED Triage Notes (Signed)
Patient reports having abdominal pain (RLQ) and diarrhea x 2 weeks and 4 days. Patient states she has been having chest pain today. Patient states she called her cardiologist today and was told to come to the ED. Patient states her renal doctor wanted her to get a Covid -19 test due to the abdominal pain and diarrhea.

## 2018-06-04 ENCOUNTER — Telehealth: Payer: Self-pay | Admitting: Gastroenterology

## 2018-06-04 NOTE — Telephone Encounter (Signed)
Pt reported that she was in the ED at [facility] 06/02/18.  Pt stated that she was diagnosed with gastritis/colitis and was prescribed carafate and imodium.  Pt would like a call to discuss this.

## 2018-06-04 NOTE — Telephone Encounter (Signed)
Spoke with patient who reports RLQ pain and cramping after eating very small amounts. The patient reports the carafate the patient was described during her recent ED admission helps but after the patient resumes eating, the pain returns. The patient states this pain as be persistent for the past 2 weeks. The patient also went on to say that the doc in the ED did a "stomach test" that proved she had gastritis and told her that she did not have reflux/GERD so the patient seemed confused about this. Please advise.

## 2018-06-05 ENCOUNTER — Other Ambulatory Visit: Payer: Self-pay

## 2018-06-05 ENCOUNTER — Telehealth: Payer: Self-pay

## 2018-06-05 ENCOUNTER — Emergency Department (HOSPITAL_COMMUNITY): Payer: 59

## 2018-06-05 ENCOUNTER — Encounter (HOSPITAL_COMMUNITY): Payer: Self-pay

## 2018-06-05 ENCOUNTER — Telehealth: Payer: Self-pay | Admitting: *Deleted

## 2018-06-05 ENCOUNTER — Emergency Department (HOSPITAL_COMMUNITY)
Admission: EM | Admit: 2018-06-05 | Discharge: 2018-06-05 | Disposition: A | Discharge status: Home / Self Care | Payer: 59 | Attending: Emergency Medicine | Admitting: Emergency Medicine

## 2018-06-05 DIAGNOSIS — Z91040 Latex allergy status: Secondary | ICD-10-CM | POA: Insufficient documentation

## 2018-06-05 DIAGNOSIS — I5032 Chronic diastolic (congestive) heart failure: Secondary | ICD-10-CM | POA: Diagnosis not present

## 2018-06-05 DIAGNOSIS — Z79899 Other long term (current) drug therapy: Secondary | ICD-10-CM | POA: Insufficient documentation

## 2018-06-05 DIAGNOSIS — K2900 Acute gastritis without bleeding: Secondary | ICD-10-CM

## 2018-06-05 DIAGNOSIS — I130 Hypertensive heart and chronic kidney disease with heart failure and stage 1 through stage 4 chronic kidney disease, or unspecified chronic kidney disease: Secondary | ICD-10-CM | POA: Diagnosis not present

## 2018-06-05 DIAGNOSIS — J449 Chronic obstructive pulmonary disease, unspecified: Secondary | ICD-10-CM | POA: Diagnosis not present

## 2018-06-05 DIAGNOSIS — R079 Chest pain, unspecified: Secondary | ICD-10-CM | POA: Diagnosis present

## 2018-06-05 DIAGNOSIS — R109 Unspecified abdominal pain: Secondary | ICD-10-CM | POA: Insufficient documentation

## 2018-06-05 DIAGNOSIS — N183 Chronic kidney disease, stage 3 (moderate): Secondary | ICD-10-CM | POA: Insufficient documentation

## 2018-06-05 DIAGNOSIS — Z7982 Long term (current) use of aspirin: Secondary | ICD-10-CM | POA: Insufficient documentation

## 2018-06-05 DIAGNOSIS — E876 Hypokalemia: Secondary | ICD-10-CM

## 2018-06-05 LAB — COMPREHENSIVE METABOLIC PANEL
ALT: 23 U/L (ref 0–44)
AST: 27 U/L (ref 15–41)
Albumin: 3.6 g/dL (ref 3.5–5.0)
Alkaline Phosphatase: 88 U/L (ref 38–126)
Anion gap: 11 (ref 5–15)
BUN: 7 mg/dL (ref 6–20)
CO2: 22 mmol/L (ref 22–32)
Calcium: 9.4 mg/dL (ref 8.9–10.3)
Chloride: 106 mmol/L (ref 98–111)
Creatinine, Ser: 0.93 mg/dL (ref 0.44–1.00)
GFR calc Af Amer: >60 mL/min (ref >60)
GFR calc non Af Amer: >60 mL/min (ref >60)
Glucose, Bld: 113 mg/dL — ABNORMAL HIGH (ref 70–99)
Potassium: 4.2 mmol/L (ref 3.5–5.1)
Sodium: 139 mmol/L (ref 135–145)
Total Bilirubin: 0.7 mg/dL (ref 0.3–1.2)
Total Protein: 8.1 g/dL (ref 6.5–8.1)

## 2018-06-05 LAB — CBC WITH DIFFERENTIAL/PLATELET
Abs Immature Granulocytes: 0.07 10*3/uL (ref 0.00–0.07)
Basophils Absolute: 0 10*3/uL (ref 0.0–0.1)
Basophils Relative: 0 %
Eosinophils Absolute: 0.3 10*3/uL (ref 0.0–0.5)
Eosinophils Relative: 3 %
HCT: 39.3 % (ref 36.0–46.0)
Hemoglobin: 12.3 g/dL (ref 12.0–15.0)
Immature Granulocytes: 1 %
Lymphocytes Relative: 30 %
Lymphs Abs: 2.8 10*3/uL (ref 0.7–4.0)
MCH: 30.6 pg (ref 26.0–34.0)
MCHC: 31.3 g/dL (ref 30.0–36.0)
MCV: 97.8 fL (ref 80.0–100.0)
Monocytes Absolute: 0.6 10*3/uL (ref 0.1–1.0)
Monocytes Relative: 7 %
Neutro Abs: 5.5 10*3/uL (ref 1.7–7.7)
Neutrophils Relative %: 59 %
Platelets: 269 10*3/uL (ref 150–400)
RBC: 4.02 MIL/uL (ref 3.87–5.11)
RDW: 13.5 % (ref 11.5–15.5)
WBC: 9.3 10*3/uL (ref 4.0–10.5)
nRBC: 0 % (ref 0.0–0.2)

## 2018-06-05 LAB — LIPASE, BLOOD: Lipase: 37 U/L (ref 11–51)

## 2018-06-05 LAB — TROPONIN I: Troponin I: <0.03 ng/mL (ref <0.03)

## 2018-06-05 LAB — BRAIN NATRIURETIC PEPTIDE: B Natriuretic Peptide: 49 pg/mL (ref 0.0–100.0)

## 2018-06-05 MED ORDER — SODIUM CHLORIDE 0.9% FLUSH
3.0000 mL | Freq: Once | INTRAVENOUS | Status: AC
  Administered 2018-06-05: 3 mL via INTRAVENOUS

## 2018-06-05 NOTE — Telephone Encounter (Signed)
See note below

## 2018-06-05 NOTE — Telephone Encounter (Signed)
Patient was seen in ER Jun 02, 2018.  Coronavirus test negative.  CT abdomen pelvis with no acute abnormality. Severe Hypokalemia. Called back patient. She is at [facility] office getting labs and is taking potassium tablets.  Advised her to continue current medications. Small frequent meals. No further testing at this point.

## 2018-06-05 NOTE — Telephone Encounter (Signed)
Pt is requesting to speak directly to Dr. Silverio Decamp "I do not want to speak with a nurse and I would like a call back today" [phone]

## 2018-06-05 NOTE — ED Triage Notes (Signed)
Patient c/o intermittent mid chest pain x 3 days. Patient denies any SOB.

## 2018-06-05 NOTE — Telephone Encounter (Signed)
Patient walked in for labs today.  Order for BMET placed, patient had low potassium in ED

## 2018-06-05 NOTE — ED Provider Notes (Signed)
[facility] DEPT Provider Note   CSN: [id number] Arrival date & time: 06/05/18  1642    History   Chief Complaint Chief Complaint  Patient presents with  . Chest Pain    HPI [name] is a 58 y.o. female with a past medical history of congestive heart failure, CAD, COPD, diverticulitis, GERD, hiatal hernia, obesity, hypertension, who presents today for evaluation of right-sided chest and abdominal pain.  She was seen for the same pain on 06/02/2018.  She reports that she had a upper respiratory infection that was treated with steroids and doxycycline.  After that she had 2 weeks of diarrhea.  After 1 week of the diarrhea she began having right-sided abdominal pain which has continued.  Her diarrhea ended on Saturday/Sunday and since then she has had right-sided chest pain.  When she was seen for this on 5/12 she had a CT scan, her symptoms were suspected to be related to steroid-induced gastritis and she was discharged with medications.  She was noted to be significantly hypokalemic at that time and when she went to her cardiologist today for a recheck they told her that if she is still having active chest pain she needs to come to the hospital.  She reports that her chest pain is constant however it gets significantly worse whenever she eats or drinks anything.  She denies any fevers or cough.  No shortness of breath or abnormal leg swelling.  She reports compliance with all of her medications.  The medications that she was prescribed occluding Protonix, Carafate, Bentyl, Imodium, and potassium and she reports that these did not help her pain, if anything they made her worse.  She reports that she spoke with her GI [facility] who says that this is reportedly not a stomach related issue.     HPI  Past Medical History:  Diagnosis Date  . Acute on chronic diastolic CHF (congestive heart failure) ([place]) 04/11/2014  . Acute suppurative otitis media without  spontaneous rupture of eardrum 02/04/2007   Centricity Description: OTITIS MEDIA, SUPPURATIVE, ACUTE, BILATERAL Qualifier: Diagnosis of  By: [name] MD, [name]  Centricity Description: OTITIS MEDIA, SUPPURATIVE, ACUTE Qualifier: Diagnosis of  By: [name] MD, [name]   . Allergic rhinitis   . Allergy   . Anemia   . Anxiety   . Arthritis   . CAD (coronary artery disease)    LHC (11/29/1999):  EF 60%; no significant CAD.  [name] Carpal tunnel syndrome   . Carpal tunnel syndrome 06/09/2006   Qualifier: Diagnosis of  By: Marca Ancona RMA, Lucy    . Chronic diastolic CHF (congestive heart failure) ([place])    a. Echo (01/21/11): Vigorous LVF, EF 65-70%, no RWMA, Gr 2 DD. b.  Echo (12/15): Moderate LVH, EF 31-51%, grade 2 diastolic dysfunction, mild LAE, normal RV function c. 01/2015: echo showing EF of 60-65% with mild LVH  . Common migraine   . COPD (chronic obstructive pulmonary disease) ([place])   . Diastolic heart failure ([place])   . Diverticulitis   . GERD (gastroesophageal reflux disease)   . Hepatic steatosis   . Hepatomegaly   . Hiatal hernia   . Hyperlipidemia   . Hypertension   . IBS (irritable bowel syndrome)   . Internal hemorrhoids   . Morbid obesity ([place])   . OSA (obstructive sleep apnea)    CPAP dependent  . PNEUMONIA, ORGAN UNSPECIFIED 03/01/2009   Qualifier: Diagnosis of  By: [name] CMA, [name]    .  Sinusitis, chronic 03/10/2012   CT sinuses 02/2012:  Chronic sinusitis with small A/F levels >> rx by ENT with prolonged augmentin F/u ct sinuses 04/2012:  Persistent sinus thickening >> rx with levaquin and clinda for 30 days.  Told to return for sinus scan in 6 weeks >> no showed for followup visit.  CT sinuses 11/2013:  Acute and chronic sinusitis noted.    . Sleep apnea    bi pap    Patient Active Problem List   Diagnosis Date Noted  . Bronchitis 02/12/2018  . Polyp of sigmoid colon   . CKD (chronic kidney disease) stage 3, GFR 30-59 ml/min ([facility]) 07/08/2016  . Cough variant asthma  07/31/2015  . Upper airway cough syndrome 04/11/2015  . Bronchitis, chronic obstructive w acute bronchitis ([place]) 04/11/2015  . Dyspnea   . Acute on chronic diastolic CHF (congestive heart failure), NYHA class 2 ([place]) 03/27/2015  . Morbid obesity due to excess calories ([place]) 03/27/2015  . Dysphagia 03/27/2015  . Hyponatremia 03/27/2015  . Acute kidney injury ([place]) 03/27/2015  . Hypokalemia 03/27/2015  . Leukocytosis 03/27/2015  . COPD exacerbation ([place]) 03/27/2015  . Sinusitis, chronic 03/10/2012  . Hyperlipidemia 10/02/2006  . Obstructive sleep apnea 06/09/2006  . Essential hypertension 06/09/2006    Past Surgical History:  Procedure Laterality Date  . ABDOMINAL HYSTERECTOMY    . CESAREAN SECTION    . COLONOSCOPY    . COLONOSCOPY WITH PROPOFOL N/A 05/05/2017   Procedure: COLONOSCOPY WITH PROPOFOL;  Surgeon: Mauri Pole, MD;  Location: [facility] ENDOSCOPY;  Service: Endoscopy;  Laterality: N/A;  . NASAL SINUS SURGERY    . PARTIAL HYSTERECTOMY    . TONSILLECTOMY    . TUBAL LIGATION    . UVULOPALATOPHARYNGOPLASTY       OB History   No obstetric history on file.      Home Medications    Prior to Admission medications   Medication Sig Start Date End Date Taking? Authorizing Provider  albuterol (PROVENTIL) (2.5 MG/3ML) 0.083% nebulizer solution Take 3 mLs (2.5 mg total) by nebulization every 4 (four) hours as needed for wheezing or shortness of breath. 05/01/17   Tanda Rockers, MD  aspirin 81 MG EC tablet TAKE 1 TABLET BY MOUTH EVERY DAY Patient taking differently: Take 81 mg by mouth daily.  04/22/18   Skeet Latch, MD  benzonatate (TESSALON) 200 MG capsule Take 1 capsule (200 mg total) by mouth 3 (three) times daily as needed for cough. Patient not taking: Reported on 06/05/2018 10/13/17   Chesley Mires, MD  carvedilol (COREG) 12.5 MG tablet Take 1 tablet (12.5 mg total) by mouth 2 (two) times daily with a meal. 01/22/18   Skeet Latch, MD  chlorpheniramine  (CHLOR-TRIMETON) 4 MG tablet Take 8 mg by mouth at bedtime. May add 1 extra every 4 hours as needed for drainage, drippy nose, throat clearing    [provider]  dexlansoprazole (DEXILANT) 60 MG capsule Take 1 capsule (60 mg total) by mouth every morning. Take 30 minutes before breakast 05/12/18   Nandigam, Venia Minks, MD  Dextromethorphan-Guaifenesin (MUCINEX DM MAXIMUM STRENGTH) 60-1200 MG TB12 Take 1 tablet by mouth at bedtime as needed (cough).     [provider]  dicyclomine (BENTYL) 10 MG capsule Take 1 capsule (10 mg total) by mouth 3 (three) times daily before meals for 5 days. 06/02/18 06/07/18  Duffy Bruce, MD  doxycycline (VIBRA-TABS) 100 MG tablet Take 1 tablet (100 mg total) by mouth 2 (two) times daily. Patient not  taking: Reported on 06/02/2018 03/11/18   Tanda Rockers, MD  famotidine (PEPCID) 20 MG tablet Take 1 tablet (20 mg total) by mouth daily at 3 pm. 06/12/17   Tanda Rockers, MD  fluticasone [facility]) 50 MCG/ACT nasal spray Place 2 sprays into both nostrils daily. Patient taking differently: Place 2 sprays into both nostrils daily as needed for allergies.  05/08/18   Chesley Mires, MD  hydrALAZINE (APRESOLINE) 25 MG tablet TAKE 1 TABLET BY MOUTH THREE TIMES A DAY Patient taking differently: Take 25 mg by mouth 3 (three) times daily.  05/11/18   Skeet Latch, MD  isosorbide dinitrate (ISORDIL) 20 MG tablet Take 1 tablet (20 mg total) by mouth 3 (three) times daily. 02/20/18   Skeet Latch, MD  loperamide (IMODIUM) 2 MG capsule Take 1 capsule (2 mg total) by mouth 4 (four) times daily as needed for diarrhea or loose stools. 06/02/18   Duffy Bruce, MD  losartan (COZAAR) 25 MG tablet Take 50 mg by mouth daily. 04/02/18   [provider]  losartan (COZAAR) 50 MG tablet TAKE 1 TABLET BY MOUTH EVERY DAY Patient not taking: No sig reported 12/01/17   Skeet Latch, MD  meloxicam (MOBIC) 15 MG tablet Take 15 mg by mouth daily as needed for pain.   03/31/17   [provider]  metolazone (ZAROXOLYN) 2.5 MG tablet Take 2.5 mg by mouth daily as needed (FOR OVERNIGHT WEIGHT GAIN OF 3 lbs. or more).    [provider]  montelukast (SINGULAIR) 10 MG tablet TAKE 1 TABLET(10 MG) BY MOUTH AT BEDTIME Patient taking differently: Take 10 mg by mouth at bedtime.  07/25/17   Tanda Rockers, MD  ondansetron (ZOFRAN) 4 MG tablet Take 1 tablet (4 mg total) by mouth every 8 (eight) hours as needed for nausea or vomiting. Patient not taking: Reported on 06/05/2018 03/30/18   Margarita Mail, PA-C  pantoprazole (PROTONIX) 20 MG tablet Take 1 tablet (20 mg total) by mouth daily for 14 days. 06/02/18 06/16/18  Duffy Bruce, MD  polyethylene glycol powder (GLYCOLAX/MIRALAX) powder Use 2 capfuls in the morning and 1 capful in the evening and as directed Patient taking differently: Take 17-34 g by mouth daily as needed for moderate constipation.  03/24/17   Mauri Pole, MD  potassium chloride SA (K-DUR) 20 MEQ tablet Take 2 tablets (40 mEq total) by mouth 2 (two) times daily for 3 days. In addition to your usual supplement Patient not taking: Reported on 06/05/2018 06/02/18 06/05/18  Duffy Bruce, MD  potassium chloride SA (K-DUR,KLOR-CON) 20 MEQ tablet TAKE 3 TABLETS (60 MEQ) BY MOUTH THREE TIMES DAILY Patient taking differently: Take 180 mEq by mouth daily.  04/27/18   Skeet Latch, MD  promethazine (PHENERGAN) 25 MG tablet Take 1 tablet (25 mg total) by mouth every 6 (six) hours as needed for nausea or vomiting. Patient not taking: Reported on 06/05/2018 04/18/17   Dalia Heading, PA-C  Respiratory Therapy Supplies (FLUTTER) [place] 1 application by Does not apply route as directed. 12/27/16   Tanda Rockers, MD  spironolactone (ALDACTONE) 25 MG tablet TAKE 1/2 TABLET BY MOUTH TWICE A DAY Patient taking differently: Take 12.5 mg by mouth 2 (two) times daily.  12/02/17   Skeet Latch, MD  sucralfate (CARAFATE) 1 g tablet Take 1 tablet (1  g total) by mouth 4 (four) times daily -  with meals and at bedtime for 5 days. 06/02/18 06/07/18  Duffy Bruce, MD  tiZANidine (ZANAFLEX) 4 MG tablet Take  4 mg by mouth every 6 (six) hours as needed for muscle spasms.  04/09/18   [provider]  topiramate (TOPAMAX) 25 MG tablet Take 25 mg by mouth 2 (two) times daily as needed (For migraines.).     [provider]  torsemide (DEMADEX) 20 MG tablet TAKE 2 TABLETS(40 MG) BY MOUTH TWICE DAILY Patient taking differently: Take 40 mg by mouth 2 (two) times daily.  06/02/17   Skeet Latch, MD  traMADol (ULTRAM) 50 MG tablet Take 1 tablet (50 mg total) by mouth every 6 (six) hours as needed. Patient taking differently: Take 50 mg by mouth every 6 (six) hours as needed for moderate pain.  03/30/18   Harris, Vernie Shanks, PA-C  UNABLE TO FIND Med Name: CPAP with sleep    [provider]    Family History Family History  Problem Relation Age of Onset  . Heart disease Mother   . Heart attack Mother   . Hypertension Mother   . Coronary artery disease Other        1st degree relatvie <50  . Breast cancer Other        aunt- ? paternal or maternal  . Asthma Sister   . Colon polyps Sister   . Stroke Neg Hx   . Colon cancer Neg Hx   . Esophageal cancer Neg Hx   . Rectal cancer Neg Hx   . Stomach cancer Neg Hx     Social History Social History   Tobacco Use  . Smoking status: Never Smoker  . Smokeless tobacco: Never Used  Substance Use Topics  . Alcohol use: No  . Drug use: No     Allergies   Sulfonamide derivatives; Doxycycline; Latex; Ciprofloxacin; Fish allergy; Hydrocortisone; and Neomycin   Review of Systems Review of Systems  Constitutional: Negative for chills and fever.  HENT: Negative for congestion.   Eyes: Negative for visual disturbance.  Respiratory: Negative for cough, chest tightness and shortness of breath.   Cardiovascular: Positive for chest pain. Negative for palpitations and leg swelling.   Gastrointestinal: Positive for abdominal pain. Negative for constipation, diarrhea, nausea and vomiting.  Genitourinary: Negative for dysuria and urgency.  Musculoskeletal: Negative for back pain and neck pain.  Skin: Negative for color change, rash and wound.  Neurological: Negative for weakness and headaches.  Psychiatric/Behavioral: Negative for confusion.  All other systems reviewed and are negative.    Physical Exam Updated Vital Signs BP 108/70   Pulse (!) 55   Temp 99 F (37.2 C)   Resp (!) 21   Ht 5\' 3"  (1.6 m)   Wt 131.5 kg   SpO2 100%   BMI 51.35 kg/m   Physical Exam Vitals signs and nursing note reviewed.  Constitutional:      General: She is not in acute distress.    Appearance: She is well-developed. She is obese. She is not diaphoretic.  HENT:     Head: Normocephalic and atraumatic.  Eyes:     General: No scleral icterus.       Right eye: No discharge.        Left eye: No discharge.     Conjunctiva/sclera: Conjunctivae normal.  Neck:     Musculoskeletal: Normal range of motion and neck supple.  Cardiovascular:     Rate and Rhythm: Normal rate and regular rhythm.     Pulses:          Dorsalis pedis pulses are 2+ on the right side and 2+ on the  left side.       Posterior tibial pulses are 2+ on the right side and 2+ on the left side.     Heart sounds: Normal heart sounds.  Pulmonary:     Effort: Pulmonary effort is normal. No respiratory distress.     Breath sounds: Normal breath sounds. No stridor. No decreased breath sounds or wheezing.  Chest:     Chest wall: No tenderness or edema.  Abdominal:     General: Bowel sounds are normal. There is no distension.     Palpations: Abdomen is soft.     Tenderness: There is abdominal tenderness.  Musculoskeletal:        General: No deformity.     Right lower leg: She exhibits no tenderness. No edema.     Left lower leg: She exhibits no tenderness. No edema.  Skin:    General: Skin is warm and dry.   Neurological:     General: No focal deficit present.     Mental Status: She is alert.     Motor: No abnormal muscle tone.  Psychiatric:        Mood and Affect: Mood normal.        Behavior: Behavior normal.      ED Treatments / Results  Labs (all labs ordered are listed, but only abnormal results are displayed) Labs Reviewed  COMPREHENSIVE METABOLIC PANEL - Abnormal; Notable for the following components:      Result Value   Glucose, Bld 113 (*)    All other components within normal limits  TROPONIN I  LIPASE, BLOOD  CBC WITH DIFFERENTIAL/PLATELET  BRAIN NATRIURETIC PEPTIDE    EKG EKG Interpretation  Date/Time:  Friday Jun 05 2018 16:59:02 EDT Ventricular Rate:  74 PR Interval:    QRS Duration: 89 QT Interval:  354 QTC Calculation: 393 R Axis:   111 Text Interpretation:  Sinus arrhythmia Low voltage with right axis deviation Baseline wander in lead(s) V3 No significant change since last tracing Confirmed by Wandra Arthurs [phone]) on 06/05/2018 5:02:54 PM   Radiology Dg Chest 2 View  Result Date: 06/05/2018 CLINICAL DATA:  Persistent chest pain. EXAM: CHEST - 2 VIEW COMPARISON:  Chest x-ray dated Jun 02, 2018. FINDINGS: Stable mild cardiomegaly. Normal mediastinal contours. Normal pulmonary vascularity. No focal consolidation, pleural effusion, or pneumothorax. No acute osseous abnormality. IMPRESSION: No active cardiopulmonary disease. Electronically Signed   By: Titus Dubin M.D.   On: 06/05/2018 17:59    Procedures Procedures (including critical care time)  Medications Ordered in ED Medications  sodium chloride flush (NS) 0.9 % injection 3 mL (3 mLs Intravenous Given 06/05/18 1732)     Initial Impression / Assessment and Plan / ED Course  I have reviewed the triage vital signs and the nursing notes.  Pertinent labs & imaging results that were available during my care of the patient were reviewed by me and considered in my medical decision making (see chart for  details).  Clinical Course as of Jun 05 20  Fri Jun 05, 2018  6301 Patient reevaluated, she is resting comfortably.  Discussed labs, suspect GI source.  Return precautions discussed.  She is requesting discharge papers.    [EH]    Clinical Course User Index [EH] Lorin Glass, PA-C      Patient presents today for evaluation of chest and abdominal pain.  She was seen for the same pain on 3 days ago on 5/12 and that has not changed since.  Her pain  started after she was on prednisone and doxycycline for a respiratory tract infection.  Chart from this visit was reviewed.  Here today she is not tachycardic or tachypneic, she is afebrile and generally well-appearing.  Troponin is not elevated, given that her chest pain has been present for many days without change, and she had negative troponin III days ago repeat troponin is not indicated.  Her CMP and CBC are both without significant electrolyte or hematologic derangements.  Her previously seen hypokalemia has resolved.  Her BNP and lipase are both not elevated.  I do not suspect ACS or cardiac cause for her symptoms.  Chart review shows that she had a CT scan of her abdomen and pelvis at the last visit which was unrevealing.  Chest x-ray does not show evidence of consolidation or other abnormality.  I suspect that she has gastritis from the steroid use combined with doxycycline that is continuing to cause her this discomfort.  I recommended that she follow-up with her gastroenterologist.  She was placed on appropriate treatment for gastritis by Dr. Ellender Hose when she was seen at her previous visit.  I have reiterated diet changes such as bland foods and small meals in addition to making sure she is staying well-hydrated.  This patient was discussed with Dr. Darl Householder who agreed with plan.   Return precautions were discussed with patient who states their understanding.  At the time of discharge patient denied any unaddressed complaints or concerns.   Patient is agreeable for discharge home.   Final Clinical Impressions(s) / ED Diagnoses   Final diagnoses:  Other acute gastritis, presence of bleeding unspecified    ED Discharge Orders    None       Ollen Gross 06/06/18 0027    Drenda Freeze, MD 06/10/18 1504

## 2018-06-05 NOTE — Telephone Encounter (Signed)
Pt presented to office today for lab work and report she is still having the same chest pain as when she went to the ED on 5/12. Pt advised if she is currently having active pain, our recommendations is she go back to ED for further evaluation and offered to call EMS. Pt declined and state she will go to ED only if symptoms worsen. Recommendations reiterated to pt and pt still declined.

## 2018-06-06 LAB — BASIC METABOLIC PANEL
BUN/Creatinine Ratio: 9 (ref 9–23)
BUN: 7 mg/dL (ref 6–24)
CO2: 22 mmol/L (ref 20–29)
Calcium: 9.8 mg/dL (ref 8.7–10.2)
Chloride: 106 mmol/L (ref 96–106)
Creatinine, Ser: 0.81 mg/dL (ref 0.57–1.00)
GFR calc Af Amer: 92 mL/min/{1.73_m2} (ref >59)
GFR calc non Af Amer: 80 mL/min/{1.73_m2} (ref >59)
Glucose: 110 mg/dL — ABNORMAL HIGH (ref 65–99)
Potassium: 4.8 mmol/L (ref 3.5–5.2)
Sodium: 141 mmol/L (ref 134–144)

## 2018-06-10 ENCOUNTER — Telehealth: Payer: Self-pay | Admitting: Gastroenterology

## 2018-06-10 ENCOUNTER — Other Ambulatory Visit: Payer: Self-pay | Admitting: Gastroenterology

## 2018-06-10 NOTE — Telephone Encounter (Signed)
Medication was sent by electronic refill request by pharmacy request today

## 2018-06-10 NOTE — Telephone Encounter (Signed)
Pt called stating that she need a refill of the medication. Pt is sched to [facility] visit with doctor on 06/23/2018 @ 2:30p

## 2018-06-12 ENCOUNTER — Telehealth: Payer: Self-pay | Admitting: Gastroenterology

## 2018-06-12 ENCOUNTER — Emergency Department (HOSPITAL_COMMUNITY): Payer: 59

## 2018-06-12 ENCOUNTER — Emergency Department (HOSPITAL_COMMUNITY)
Admission: EM | Admit: 2018-06-12 | Discharge: 2018-06-12 | Disposition: A | Discharge status: Home / Self Care | Payer: 59 | Attending: Emergency Medicine | Admitting: Emergency Medicine

## 2018-06-12 ENCOUNTER — Other Ambulatory Visit: Payer: Self-pay

## 2018-06-12 ENCOUNTER — Encounter (HOSPITAL_COMMUNITY): Payer: Self-pay | Admitting: Emergency Medicine

## 2018-06-12 DIAGNOSIS — R109 Unspecified abdominal pain: Secondary | ICD-10-CM

## 2018-06-12 DIAGNOSIS — I130 Hypertensive heart and chronic kidney disease with heart failure and stage 1 through stage 4 chronic kidney disease, or unspecified chronic kidney disease: Secondary | ICD-10-CM | POA: Insufficient documentation

## 2018-06-12 DIAGNOSIS — R10817 Generalized abdominal tenderness: Secondary | ICD-10-CM | POA: Diagnosis not present

## 2018-06-12 DIAGNOSIS — I259 Chronic ischemic heart disease, unspecified: Secondary | ICD-10-CM | POA: Insufficient documentation

## 2018-06-12 DIAGNOSIS — J449 Chronic obstructive pulmonary disease, unspecified: Secondary | ICD-10-CM | POA: Insufficient documentation

## 2018-06-12 DIAGNOSIS — N183 Chronic kidney disease, stage 3 (moderate): Secondary | ICD-10-CM | POA: Insufficient documentation

## 2018-06-12 DIAGNOSIS — Z79899 Other long term (current) drug therapy: Secondary | ICD-10-CM | POA: Insufficient documentation

## 2018-06-12 DIAGNOSIS — Z7982 Long term (current) use of aspirin: Secondary | ICD-10-CM | POA: Insufficient documentation

## 2018-06-12 DIAGNOSIS — Z91040 Latex allergy status: Secondary | ICD-10-CM | POA: Insufficient documentation

## 2018-06-12 DIAGNOSIS — R1011 Right upper quadrant pain: Secondary | ICD-10-CM | POA: Diagnosis not present

## 2018-06-12 DIAGNOSIS — R05 Cough: Secondary | ICD-10-CM | POA: Insufficient documentation

## 2018-06-12 DIAGNOSIS — I5032 Chronic diastolic (congestive) heart failure: Secondary | ICD-10-CM | POA: Diagnosis not present

## 2018-06-12 LAB — CBC WITH DIFFERENTIAL/PLATELET
Abs Immature Granulocytes: 0.05 10*3/uL (ref 0.00–0.07)
Basophils Absolute: 0.1 10*3/uL (ref 0.0–0.1)
Basophils Relative: 1 %
Eosinophils Absolute: 0.2 10*3/uL (ref 0.0–0.5)
Eosinophils Relative: 2 %
HCT: 39.4 % (ref 36.0–46.0)
Hemoglobin: 12.8 g/dL (ref 12.0–15.0)
Immature Granulocytes: 1 %
Lymphocytes Relative: 32 %
Lymphs Abs: 3.2 10*3/uL (ref 0.7–4.0)
MCH: 30.9 pg (ref 26.0–34.0)
MCHC: 32.5 g/dL (ref 30.0–36.0)
MCV: 95.2 fL (ref 80.0–100.0)
Monocytes Absolute: 0.9 10*3/uL (ref 0.1–1.0)
Monocytes Relative: 9 %
Neutro Abs: 5.6 10*3/uL (ref 1.7–7.7)
Neutrophils Relative %: 55 %
Platelets: 292 10*3/uL (ref 150–400)
RBC: 4.14 MIL/uL (ref 3.87–5.11)
RDW: 13.6 % (ref 11.5–15.5)
WBC: 10 10*3/uL (ref 4.0–10.5)
nRBC: 0 % (ref 0.0–0.2)

## 2018-06-12 LAB — URINALYSIS, ROUTINE W REFLEX MICROSCOPIC
Bilirubin Urine: NEGATIVE
Glucose, UA: NEGATIVE mg/dL
Hgb urine dipstick: NEGATIVE
Ketones, ur: NEGATIVE mg/dL
Leukocytes,Ua: NEGATIVE
Nitrite: NEGATIVE
Protein, ur: NEGATIVE mg/dL
Specific Gravity, Urine: 1.015 (ref 1.005–1.030)
pH: 6 (ref 5.0–8.0)

## 2018-06-12 LAB — COMPREHENSIVE METABOLIC PANEL
ALT: 22 U/L (ref 0–44)
AST: 23 U/L (ref 15–41)
Albumin: 3.6 g/dL (ref 3.5–5.0)
Alkaline Phosphatase: 90 U/L (ref 38–126)
Anion gap: 7 (ref 5–15)
BUN: 14 mg/dL (ref 6–20)
CO2: 26 mmol/L (ref 22–32)
Calcium: 9.5 mg/dL (ref 8.9–10.3)
Chloride: 104 mmol/L (ref 98–111)
Creatinine, Ser: 1 mg/dL (ref 0.44–1.00)
GFR calc Af Amer: >60 mL/min (ref >60)
GFR calc non Af Amer: >60 mL/min (ref >60)
Glucose, Bld: 106 mg/dL — ABNORMAL HIGH (ref 70–99)
Potassium: 3.8 mmol/L (ref 3.5–5.1)
Sodium: 137 mmol/L (ref 135–145)
Total Bilirubin: 0.3 mg/dL (ref 0.3–1.2)
Total Protein: 8.1 g/dL (ref 6.5–8.1)

## 2018-06-12 LAB — TROPONIN I: Troponin I: <0.03 ng/mL (ref <0.03)

## 2018-06-12 LAB — LIPASE, BLOOD: Lipase: 54 U/L — ABNORMAL HIGH (ref 11–51)

## 2018-06-12 MED ORDER — PANTOPRAZOLE SODIUM 20 MG PO TBEC: 40.0000 mg | DELAYED_RELEASE_TABLET | Freq: Every day | ORAL | 0 refills | Status: DC

## 2018-06-12 MED ORDER — DICYCLOMINE HCL 10 MG/ML IM SOLN
20.0000 mg | Freq: Once | INTRAMUSCULAR | Status: AC
  Administered 2018-06-12: 20 mg via INTRAMUSCULAR
  Filled 2018-06-12: qty 2

## 2018-06-12 MED ORDER — ALUM & MAG HYDROXIDE-SIMETH 200-200-20 MG/5ML PO SUSP
30.0000 mL | Freq: Once | ORAL | Status: AC
  Administered 2018-06-12: 30 mL via ORAL
  Filled 2018-06-12: qty 30

## 2018-06-12 MED ORDER — PANTOPRAZOLE SODIUM 40 MG IV SOLR
40.0000 mg | Freq: Once | INTRAVENOUS | Status: AC
  Administered 2018-06-12: 40 mg via INTRAVENOUS
  Filled 2018-06-12: qty 40

## 2018-06-12 MED ORDER — ONDANSETRON HCL 4 MG/2ML IJ SOLN
4.0000 mg | Freq: Once | INTRAMUSCULAR | Status: AC
  Administered 2018-06-12: 4 mg via INTRAVENOUS
  Filled 2018-06-12: qty 2

## 2018-06-12 MED ORDER — MORPHINE SULFATE (PF) 4 MG/ML IV SOLN
4.0000 mg | Freq: Once | INTRAVENOUS | Status: AC
  Administered 2018-06-12: 4 mg via INTRAVENOUS
  Filled 2018-06-12: qty 1

## 2018-06-12 MED ORDER — FAMOTIDINE 20 MG PO TABS: 20.0000 mg | ORAL_TABLET | Freq: Two times a day (BID) | ORAL | 0 refills | Status: DC

## 2018-06-12 NOTE — Telephone Encounter (Signed)
Patient called said that she is not getting any better with the medication sucralfate. She is having stomach and upper chest pain.

## 2018-06-12 NOTE — ED Provider Notes (Signed)
[facility] DEPT Provider Note   CSN: [id number] Arrival date & time: 06/12/18  1523    History   Chief Complaint Chief Complaint  Patient presents with   Abdominal Pain   Cough    HPI [name] is a 58 y.o. female.     HPI  Pt is a 58 y/o female with a h/o CHF CAD, COPD, diverticulitis, GERD, hiatal hernia, obesity, hypertension, who presents to the ED c/o right sided abd pain that began 2 weeks ago. Pain is constant. Pain is worse when she eats. States pain has gotten worse over the last 2 weeks, and she rates it 10/10 currently. States that pain "feels like there is something in my stomach that wants to come out."  She reports associated nausea, but denies vomiting. She also c/o pain to the center of her chest that is also worse when she eats or when her abd pain worsens. She had diarrhea 2 days ago that has resolved. Has not had BM for 2 days. She is still passing flatus. Denies urinary complaints. Denies bloody or tarry stools. Denies SOB. Does report a cough that started yesterday. No fevers.   States she has been taking dicyclomine, carafate, pantoprazole and pepcid without resolution of sxs.   States she called her GI doctor and was advised to come to the ED.   Past Medical History:  Diagnosis Date   Acute on chronic diastolic CHF (congestive heart failure) ([place]) 04/11/2014   Acute suppurative otitis media without spontaneous rupture of eardrum 02/04/2007   Centricity Description: OTITIS MEDIA, SUPPURATIVE, ACUTE, BILATERAL Qualifier: Diagnosis of  By: [name] MD, [name]  Centricity Description: OTITIS MEDIA, SUPPURATIVE, ACUTE Qualifier: Diagnosis of  By: [name] MD, Sean A    Allergic rhinitis    Allergy    Anemia    Anxiety    Arthritis    CAD (coronary artery disease)    LHC (11/29/1999):  EF 60%; no significant CAD.   Carpal tunnel syndrome    Carpal tunnel syndrome 06/09/2006   Qualifier: Diagnosis of  By: Marca Ancona  RMA, Lucy     Chronic diastolic CHF (congestive heart failure) ([place])    a. Echo (01/21/11): Vigorous LVF, EF 65-70%, no RWMA, Gr 2 DD. b.  Echo (12/15): Moderate LVH, EF 17-61%, grade 2 diastolic dysfunction, mild LAE, normal RV function c. 01/2015: echo showing EF of 60-65% with mild LVH   Common migraine    COPD (chronic obstructive pulmonary disease) (HCC)    Diastolic heart failure (HCC)    Diverticulitis    GERD (gastroesophageal reflux disease)    Hepatic steatosis    Hepatomegaly    Hiatal hernia    Hyperlipidemia    Hypertension    IBS (irritable bowel syndrome)    Internal hemorrhoids    Morbid obesity ([place])    OSA (obstructive sleep apnea)    CPAP dependent   PNEUMONIA, ORGAN UNSPECIFIED 03/01/2009   Qualifier: Diagnosis of  By: [name] CMA, Jennifer     Sinusitis, chronic 03/10/2012   CT sinuses 02/2012:  Chronic sinusitis with small A/F levels >> rx by ENT with prolonged augmentin F/u ct sinuses 04/2012:  Persistent sinus thickening >> rx with levaquin and clinda for 30 days.  Told to return for sinus scan in 6 weeks >> no showed for followup visit.  CT sinuses 11/2013:  Acute and chronic sinusitis noted.     Sleep apnea    bi pap    Patient Active Problem  List   Diagnosis Date Noted   Bronchitis 02/12/2018   Polyp of sigmoid colon    CKD (chronic kidney disease) stage 3, GFR 30-59 ml/min (HCC) 07/08/2016   Cough variant asthma 07/31/2015   Upper airway cough syndrome 04/11/2015   Bronchitis, chronic obstructive w acute bronchitis ([place]) 04/11/2015   Dyspnea    Acute on chronic diastolic CHF (congestive heart failure), NYHA class 2 ([place]) 03/27/2015   Morbid obesity due to excess calories ([place]) 03/27/2015   Dysphagia 03/27/2015   Hyponatremia 03/27/2015   Acute kidney injury ([place]) 03/27/2015   Hypokalemia 03/27/2015   Leukocytosis 03/27/2015   COPD exacerbation ([place]) 03/27/2015   Sinusitis, chronic 03/10/2012   Hyperlipidemia  10/02/2006   Obstructive sleep apnea 06/09/2006   Essential hypertension 06/09/2006    Past Surgical History:  Procedure Laterality Date   ABDOMINAL HYSTERECTOMY     CESAREAN SECTION     COLONOSCOPY     COLONOSCOPY WITH PROPOFOL N/A 05/05/2017   Procedure: COLONOSCOPY WITH PROPOFOL;  Surgeon: Mauri Pole, MD;  Location: [facility] ENDOSCOPY;  Service: Endoscopy;  Laterality: N/A;   NASAL SINUS SURGERY     PARTIAL HYSTERECTOMY     TONSILLECTOMY     TUBAL LIGATION     UVULOPALATOPHARYNGOPLASTY       OB History   No obstetric history on file.      Home Medications    Prior to Admission medications   Medication Sig Start Date End Date Taking? Authorizing Provider  albuterol (PROVENTIL) (2.5 MG/3ML) 0.083% nebulizer solution Take 3 mLs (2.5 mg total) by nebulization every 4 (four) hours as needed for wheezing or shortness of breath. 05/01/17   Tanda Rockers, MD  aspirin 81 MG EC tablet TAKE 1 TABLET BY MOUTH EVERY DAY Patient taking differently: Take 81 mg by mouth daily.  04/22/18   Skeet Latch, MD  benzonatate (TESSALON) 200 MG capsule Take 1 capsule (200 mg total) by mouth 3 (three) times daily as needed for cough. Patient not taking: Reported on 06/05/2018 10/13/17   Chesley Mires, MD  carvedilol (COREG) 12.5 MG tablet Take 1 tablet (12.5 mg total) by mouth 2 (two) times daily with a meal. 01/22/18   Skeet Latch, MD  chlorpheniramine (CHLOR-TRIMETON) 4 MG tablet Take 8 mg by mouth at bedtime. May add 1 extra every 4 hours as needed for drainage, drippy nose, throat clearing    [provider]  dexlansoprazole (DEXILANT) 60 MG capsule Take 1 capsule (60 mg total) by mouth every morning. Take 30 minutes before breakast 05/12/18   Nandigam, Venia Minks, MD  Dextromethorphan-Guaifenesin (MUCINEX DM MAXIMUM STRENGTH) 60-1200 MG TB12 Take 1 tablet by mouth at bedtime as needed (cough).     [provider]  dicyclomine (BENTYL) 10 MG capsule Take 1 capsule  (10 mg total) by mouth 3 (three) times daily before meals for 5 days. 06/02/18 06/07/18  Duffy Bruce, MD  doxycycline (VIBRA-TABS) 100 MG tablet Take 1 tablet (100 mg total) by mouth 2 (two) times daily. Patient not taking: Reported on 06/02/2018 03/11/18   Tanda Rockers, MD  famotidine (PEPCID) 20 MG tablet Take 1 tablet (20 mg total) by mouth 2 (two) times daily for 7 days. 06/12/18 06/19/18  Ronnesha Mester S, PA-C  fluticasone (FLONASE) 50 MCG/ACT nasal spray Place 2 sprays into both nostrils daily. Patient taking differently: Place 2 sprays into both nostrils daily as needed for allergies.  05/08/18   Chesley Mires, MD  hydrALAZINE (APRESOLINE) 25 MG tablet TAKE 1  TABLET BY MOUTH THREE TIMES A DAY Patient taking differently: Take 25 mg by mouth 3 (three) times daily.  05/11/18   Skeet Latch, MD  isosorbide dinitrate (ISORDIL) 20 MG tablet Take 1 tablet (20 mg total) by mouth 3 (three) times daily. 02/20/18   Skeet Latch, MD  loperamide (IMODIUM) 2 MG capsule Take 1 capsule (2 mg total) by mouth 4 (four) times daily as needed for diarrhea or loose stools. 06/02/18   Duffy Bruce, MD  losartan (COZAAR) 25 MG tablet Take 50 mg by mouth daily. 04/02/18   [provider]  losartan (COZAAR) 50 MG tablet TAKE 1 TABLET BY MOUTH EVERY DAY Patient not taking: No sig reported 12/01/17   Skeet Latch, MD  meloxicam (MOBIC) 15 MG tablet Take 15 mg by mouth daily as needed for pain.  03/31/17   [provider]  metolazone (ZAROXOLYN) 2.5 MG tablet Take 2.5 mg by mouth daily as needed (FOR OVERNIGHT WEIGHT GAIN OF 3 lbs. or more).    [provider]  montelukast (SINGULAIR) 10 MG tablet TAKE 1 TABLET(10 MG) BY MOUTH AT BEDTIME Patient taking differently: Take 10 mg by mouth at bedtime.  07/25/17   Tanda Rockers, MD  ondansetron (ZOFRAN) 4 MG tablet Take 1 tablet (4 mg total) by mouth every 8 (eight) hours as needed for nausea or vomiting. Patient not taking: Reported  on 06/05/2018 03/30/18   Margarita Mail, PA-C  pantoprazole (PROTONIX) 20 MG tablet Take 2 tablets (40 mg total) by mouth daily for 7 days. 06/12/18 06/19/18  Yolani Vo S, PA-C  polyethylene glycol powder (GLYCOLAX/MIRALAX) powder Use 2 capfuls in the morning and 1 capful in the evening and as directed Patient taking differently: Take 17-34 g by mouth daily as needed for moderate constipation.  03/24/17   Mauri Pole, MD  potassium chloride SA (K-DUR) 20 MEQ tablet Take 2 tablets (40 mEq total) by mouth 2 (two) times daily for 3 days. In addition to your usual supplement Patient not taking: Reported on 06/05/2018 06/02/18 06/05/18  Duffy Bruce, MD  potassium chloride SA (K-DUR,KLOR-CON) 20 MEQ tablet TAKE 3 TABLETS (60 MEQ) BY MOUTH THREE TIMES DAILY Patient taking differently: Take 180 mEq by mouth daily.  04/27/18   Skeet Latch, MD  promethazine (PHENERGAN) 25 MG tablet Take 1 tablet (25 mg total) by mouth every 6 (six) hours as needed for nausea or vomiting. Patient not taking: Reported on 06/05/2018 04/18/17   Dalia Heading, PA-C  Respiratory Therapy Supplies (FLUTTER) [place] 1 application by Does not apply route as directed. 12/27/16   Tanda Rockers, MD  spironolactone (ALDACTONE) 25 MG tablet TAKE 1/2 TABLET BY MOUTH TWICE A DAY Patient taking differently: Take 12.5 mg by mouth 2 (two) times daily.  12/02/17   Skeet Latch, MD  sucralfate (CARAFATE) 1 g tablet TAKE 1 TABLET (1 G TOTAL) BY MOUTH 4 (FOUR) TIMES DAILY - WITH MEALS AND AT BEDTIME FOR 5 DAYS. 06/10/18 06/15/18  Mauri Pole, MD  tiZANidine (ZANAFLEX) 4 MG tablet Take 4 mg by mouth every 6 (six) hours as needed for muscle spasms.  04/09/18   [provider]  topiramate (TOPAMAX) 25 MG tablet Take 25 mg by mouth 2 (two) times daily as needed (For migraines.).     [provider]  torsemide (DEMADEX) 20 MG tablet TAKE 2 TABLETS(40 MG) BY MOUTH TWICE DAILY Patient taking differently: Take 40  mg by mouth 2 (two) times daily.  06/02/17   Skeet Latch,  MD  traMADol (ULTRAM) 50 MG tablet Take 1 tablet (50 mg total) by mouth every 6 (six) hours as needed. Patient taking differently: Take 50 mg by mouth every 6 (six) hours as needed for moderate pain.  03/30/18   Harris, Vernie Shanks, PA-C  UNABLE TO FIND Med Name: CPAP with sleep    [provider]    Family History Family History  Problem Relation Age of Onset   Heart disease Mother    Heart attack Mother    Hypertension Mother    Coronary artery disease Other        1st degree relatvie <50   Breast cancer Other        aunt- ? paternal or maternal   Asthma Sister    Colon polyps Sister    Stroke Neg Hx    Colon cancer Neg Hx    Esophageal cancer Neg Hx    Rectal cancer Neg Hx    Stomach cancer Neg Hx     Social History Social History   Tobacco Use   Smoking status: Never Smoker   Smokeless tobacco: Never Used  Substance Use Topics   Alcohol use: No   Drug use: No     Allergies   Sulfonamide derivatives; Doxycycline; Latex; Ciprofloxacin; Fish allergy; Hydrocortisone; and Neomycin   Review of Systems Review of Systems  Constitutional: Negative for chills and fever.  HENT: Negative for ear pain and sore throat.   Eyes: Negative for pain and visual disturbance.  Respiratory: Positive for cough. Negative for shortness of breath.   Cardiovascular: Positive for chest pain.  Gastrointestinal: Positive for abdominal pain, diarrhea (resolved) and nausea. Negative for constipation and vomiting.  Genitourinary: Negative for dysuria and hematuria.  Musculoskeletal: Negative for arthralgias and back pain.  Skin: Negative for color change and rash.  Neurological: Negative for headaches.  All other systems reviewed and are negative.    Physical Exam Updated Vital Signs BP 126/80    Pulse 84    Temp 98.8 F (37.1 C) (Oral)    Resp (!) 23    SpO2 98%   Physical Exam Vitals signs and nursing  note reviewed.  Constitutional:      General: She is not in acute distress.    Appearance: She is well-developed.  HENT:     Head: Normocephalic and atraumatic.  Eyes:     Conjunctiva/sclera: Conjunctivae normal.  Neck:     Musculoskeletal: Neck supple.  Cardiovascular:     Rate and Rhythm: Normal rate and regular rhythm.     Heart sounds: Normal heart sounds. No murmur.  Pulmonary:     Effort: Pulmonary effort is normal. No respiratory distress.     Breath sounds: Normal breath sounds. No wheezing, rhonchi or rales.  Abdominal:     Palpations: Abdomen is soft.     Comments: Generalized abd tenderness with voluntary guarding. No rigidity. Normoactive BS in all 4 quadrants.  Skin:    General: Skin is warm and dry.  Neurological:     Mental Status: She is alert.      ED Treatments / Results  Labs (all labs ordered are listed, but only abnormal results are displayed) Labs Reviewed  COMPREHENSIVE METABOLIC PANEL - Abnormal; Notable for the following components:      Result Value   Glucose, Bld 106 (*)    All other components within normal limits  LIPASE, BLOOD - Abnormal; Notable for the following components:   Lipase 54 (*)    All other  components within normal limits  CBC WITH DIFFERENTIAL/PLATELET  URINALYSIS, ROUTINE W REFLEX MICROSCOPIC  TROPONIN I    EKG EKG Interpretation  Date/Time:  Friday Jun 12 2018 16:33:53 EDT Ventricular Rate:  60 PR Interval:    QRS Duration: 92 QT Interval:  404 QTC Calculation: 404 R Axis:   87 Text Interpretation:  Sinus rhythm Anteroseptal infarct, age indeterminate no significant change since Jun 05 2018 Confirmed by Sherwood Gambler [phone]) on 06/12/2018 5:23:25 PM Also confirmed by Sherwood Gambler [phone]), editor Hattie Perch [phone])  on 06/13/2018 1:25:13 PM   Radiology Dg Abd Acute W/chest  Result Date: 06/12/2018 CLINICAL DATA:  Diarrhea EXAM: DG ABDOMEN ACUTE W/ 1V CHEST COMPARISON:  Chest x-ray dated 06/05/2018. CT  and pelvis dated 06/02/2018. FINDINGS: The cardiac silhouette is enlarged. There is no large pleural effusion or pneumothorax. No significant area of consolidation. The bowel gas pattern is nonspecific and nonobstructive. There is no acute osseous abnormality. Calcification at the level of the lower lumbar spine on the right represents a vascular calcification. IMPRESSION: 1. Nonobstructive bowel gas pattern. 2. No acute cardiopulmonary process. Electronically Signed   By: Constance Holster M.D.   On: 06/12/2018 18:38   US Abdomen Limited Ruq  Result Date: 06/12/2018 CLINICAL DATA:  Right upper quadrant pain EXAM: ULTRASOUND ABDOMEN LIMITED RIGHT UPPER QUADRANT COMPARISON:  CT abdomen and pelvis Jun 02, 2018 FINDINGS: Gallbladder: No gallstones or wall thickening visualized. There is no pericholecystic fluid. No sonographic Murphy sign noted by sonographer. Common bile duct: Diameter: 6 mm. No intrahepatic or extrahepatic biliary duct dilatation. Liver: No focal lesion identified. Liver echogenicity overall is increased. Portal vein is patent on color Doppler imaging with normal direction of blood flow towards the liver. IMPRESSION: Increased liver echogenicity, a finding felt to be indicative of hepatic steatosis. While no focal liver lesions are evident on this study, it must be cautioned that the sensitivity of ultrasound for detection of focal liver lesions is diminished in this circumstance. Study otherwise unremarkable. Electronically Signed   By: Lowella Grip III M.D.   On: 06/12/2018 21:16    Procedures Procedures (including critical care time)  Medications Ordered in ED Medications  dicyclomine (BENTYL) injection 20 mg (20 mg Intramuscular Given 06/12/18 1719)  morphine 4 MG/ML injection 4 mg (4 mg Intravenous Given 06/12/18 1719)  ondansetron (ZOFRAN) injection 4 mg (4 mg Intravenous Given 06/12/18 1719)  alum & mag hydroxide-simeth (MAALOX/MYLANTA) 200-200-20 MG/5ML suspension 30 mL (30  mLs Oral Given 06/12/18 1719)  pantoprazole (PROTONIX) injection 40 mg (40 mg Intravenous Given 06/12/18 1719)  morphine 4 MG/ML injection 4 mg (4 mg Intravenous Given 06/12/18 1934)     Initial Impression / Assessment and Plan / ED Course  I have reviewed the triage vital signs and the nursing notes.  Pertinent labs & imaging results that were available during my care of the patient were reviewed by me and considered in my medical decision making (see chart for details).     Final Clinical Impressions(s) / ED Diagnoses   Final diagnoses:  RUQ pain  Abdominal pain, unspecified abdominal location   58 year old female present emergency department today complaining of right upper abdominal pain that radiates to the chest.  Symptoms worsen when she eats.  Associated with nausea but no vomiting.  No fevers.  No cough.  Pt has been seen 3 times in the ED This month for similar complaints, pain unchanged today. Has been following with GI for sxs.   Initially with generalized abd  tenderness, but abd is soft and BS active in all quadrants. Lungs CTAB. Heart with RRR.   CBC WNL CMP WNL Lipase marginally elevated, I doubt that this is contributing to her sxs or representative of acute pancreatitis UA without evidence of UTI  7:48 PM CONSULT with Dr. Geoffry Paradise with GI. Reviewed prior imaging studies and noted gallbladder sludge on past [place], recommended obtaining RUQ [place] to r/o gallbladder pathology. They will consult if pt requires admission, but if patient improving and does not require admission, then f/u as outpatient.  Pt given pain meds, bentyl, zofran, protonix, and gi cocktail. On reassessment, pt states she feels improved.  Repeat abd exam is reassuring, non surgical abdomen. Pt has been able to tolerate PO. Discussed findings with patient and plan for increasing her PPI and H2 blocker. Advised her to f/u with GI and discuss possible need for an EGD. Advised to monitor sxs and to return if worse.  Pt voices understanding and is in agreement with plan, all questions answered. Pt stable for d/c.   Pt seen in conjunction with Dr. Regenia Skeeter who personally evaluated the patient and is in agreement with the plan for d/c with outpt GI f/u. He advises to increased protonix to 40 bid and pepcid to 20 bid.  ED Discharge Orders         Ordered    famotidine (PEPCID) 20 MG tablet  2 times daily     06/12/18 2253    pantoprazole (PROTONIX) 20 MG tablet  Daily     06/12/18 2253           Rodney Booze, PA-C 06/13/18 1420    Sherwood Gambler, MD 06/17/18 1316

## 2018-06-12 NOTE — ED Notes (Signed)
ED Provider at bedside. 

## 2018-06-12 NOTE — Telephone Encounter (Signed)
Left message on machine to call back  

## 2018-06-12 NOTE — Discharge Instructions (Addendum)
Please increase your protonix for 40 mg twice daily for the next 7 days.   Please take 20 mg of your pepcid twice daily for the next  7 days.  Please call your GI doctor in 3 days to set up an appointment for follow-up next week.  Return to the emergency department for any new or worsening symptoms including fevers, worsening pain, inability to keep food down.

## 2018-06-12 NOTE — ED Notes (Signed)
Provided pt with water and apple juice for PO challenge.

## 2018-06-12 NOTE — Telephone Encounter (Signed)
I spoke with the pt's son and he states the pt is in the ED now for abd pain and chest discomfort.

## 2018-06-12 NOTE — ED Triage Notes (Signed)
Pt c/o abd pains since May 12. Called her GI told and was advised to go to Ed bc can do endoscopy here if need to. Reports some diarrhea.

## 2018-06-12 NOTE — ED Notes (Signed)
Patient transported to X-ray 

## 2018-06-16 ENCOUNTER — Other Ambulatory Visit: Payer: Self-pay | Admitting: *Deleted

## 2018-06-16 ENCOUNTER — Telehealth: Payer: Self-pay | Admitting: Gastroenterology

## 2018-06-16 DIAGNOSIS — R109 Unspecified abdominal pain: Secondary | ICD-10-CM

## 2018-06-16 NOTE — Telephone Encounter (Signed)
EGD scheduled at [facility] at 9:15 am on 06/22/18 Telephone previsit scheduled on 06/18/18 at 4:00 pm COVID-19 screening and orders in Epic on 06/18/18 at 10:25 am.  Patient told to self-quarantine from the time of the screening until the procedure.

## 2018-06-16 NOTE — Telephone Encounter (Signed)
Pt's daughter Odette Horns is requesting to speak with Dr. Silverio Decamp. She is very concerned about her mother's health because she has been at the ED several times for the same reason and when she speaks with pt she feels like she does not get full information. She is in pt's [place]. Pls call her.

## 2018-06-16 NOTE — Telephone Encounter (Signed)
Please send me the telephone number for her daughter, not listed in epic

## 2018-06-16 NOTE — Telephone Encounter (Signed)
Dr. Silverio Decamp, please see note below:

## 2018-06-16 NOTE — Telephone Encounter (Signed)
Called patient and also called her daughter Odette Horns.  She has persistent upper abdominal pain and discomfort.  Concerned that she is not improving.  She has been to ER multiple times in the past month.  Will plan to proceed with EGD to further evaluate, has to be done at [facility] endoscopy unit.  Schedule next available appointment.  Reschedule office tele visit on June 2 for after EGD

## 2018-06-17 ENCOUNTER — Other Ambulatory Visit: Payer: Self-pay | Admitting: Gastroenterology

## 2018-06-18 ENCOUNTER — Ambulatory Visit (AMBULATORY_SURGERY_CENTER): Payer: Self-pay

## 2018-06-18 ENCOUNTER — Other Ambulatory Visit (HOSPITAL_COMMUNITY): Admission: RE | Admit: 2018-06-18 | Payer: 59 | Source: Ambulatory Visit

## 2018-06-18 ENCOUNTER — Encounter (HOSPITAL_COMMUNITY): Payer: Self-pay

## 2018-06-18 ENCOUNTER — Emergency Department (HOSPITAL_COMMUNITY)
Admission: EM | Admit: 2018-06-18 | Discharge: 2018-06-18 | Disposition: A | Discharge status: Home / Self Care | Payer: 59 | Attending: Emergency Medicine | Admitting: Emergency Medicine

## 2018-06-18 ENCOUNTER — Telehealth: Payer: Self-pay | Admitting: Gastroenterology

## 2018-06-18 ENCOUNTER — Other Ambulatory Visit: Payer: Self-pay

## 2018-06-18 ENCOUNTER — Other Ambulatory Visit (HOSPITAL_COMMUNITY): Payer: 59

## 2018-06-18 VITALS — Ht 63.0 in | Wt 289.0 lb

## 2018-06-18 DIAGNOSIS — Z91040 Latex allergy status: Secondary | ICD-10-CM | POA: Diagnosis not present

## 2018-06-18 DIAGNOSIS — I130 Hypertensive heart and chronic kidney disease with heart failure and stage 1 through stage 4 chronic kidney disease, or unspecified chronic kidney disease: Secondary | ICD-10-CM | POA: Insufficient documentation

## 2018-06-18 DIAGNOSIS — N183 Chronic kidney disease, stage 3 (moderate): Secondary | ICD-10-CM | POA: Insufficient documentation

## 2018-06-18 DIAGNOSIS — J449 Chronic obstructive pulmonary disease, unspecified: Secondary | ICD-10-CM | POA: Diagnosis not present

## 2018-06-18 DIAGNOSIS — I5030 Unspecified diastolic (congestive) heart failure: Secondary | ICD-10-CM | POA: Insufficient documentation

## 2018-06-18 DIAGNOSIS — Z7982 Long term (current) use of aspirin: Secondary | ICD-10-CM | POA: Insufficient documentation

## 2018-06-18 DIAGNOSIS — R109 Unspecified abdominal pain: Secondary | ICD-10-CM

## 2018-06-18 DIAGNOSIS — R1013 Epigastric pain: Secondary | ICD-10-CM | POA: Diagnosis not present

## 2018-06-18 DIAGNOSIS — Z79899 Other long term (current) drug therapy: Secondary | ICD-10-CM | POA: Diagnosis not present

## 2018-06-18 DIAGNOSIS — Z1159 Encounter for screening for other viral diseases: Secondary | ICD-10-CM | POA: Diagnosis not present

## 2018-06-18 LAB — CBC
HCT: 36.8 % (ref 36.0–46.0)
Hemoglobin: 11.9 g/dL — ABNORMAL LOW (ref 12.0–15.0)
MCH: 31.2 pg (ref 26.0–34.0)
MCHC: 32.3 g/dL (ref 30.0–36.0)
MCV: 96.6 fL (ref 80.0–100.0)
Platelets: 284 10*3/uL (ref 150–400)
RBC: 3.81 MIL/uL — ABNORMAL LOW (ref 3.87–5.11)
RDW: 13.3 % (ref 11.5–15.5)
WBC: 9 10*3/uL (ref 4.0–10.5)
nRBC: 0 % (ref 0.0–0.2)

## 2018-06-18 LAB — COMPREHENSIVE METABOLIC PANEL
ALT: 18 U/L (ref 0–44)
AST: 22 U/L (ref 15–41)
Albumin: 3.3 g/dL — ABNORMAL LOW (ref 3.5–5.0)
Alkaline Phosphatase: 83 U/L (ref 38–126)
Anion gap: 10 (ref 5–15)
BUN: 11 mg/dL (ref 6–20)
CO2: 27 mmol/L (ref 22–32)
Calcium: 9.2 mg/dL (ref 8.9–10.3)
Chloride: 104 mmol/L (ref 98–111)
Creatinine, Ser: 0.98 mg/dL (ref 0.44–1.00)
GFR calc Af Amer: >60 mL/min (ref >60)
GFR calc non Af Amer: >60 mL/min (ref >60)
Glucose, Bld: 111 mg/dL — ABNORMAL HIGH (ref 70–99)
Potassium: 4.1 mmol/L (ref 3.5–5.1)
Sodium: 141 mmol/L (ref 135–145)
Total Bilirubin: 0.9 mg/dL (ref 0.3–1.2)
Total Protein: 7.2 g/dL (ref 6.5–8.1)

## 2018-06-18 LAB — CBG MONITORING, ED: Glucose-Capillary: 92 mg/dL (ref 70–99)

## 2018-06-18 LAB — I-STAT BETA HCG BLOOD, ED (MC, WL, AP ONLY): I-stat hCG, quantitative: <5 m[IU]/mL (ref <5)

## 2018-06-18 LAB — LIPASE, BLOOD: Lipase: 44 U/L (ref 11–51)

## 2018-06-18 LAB — SARS CORONAVIRUS 2 BY RT PCR (HOSPITAL ORDER, PERFORMED IN ~~LOC~~ HOSPITAL LAB): SARS Coronavirus 2: NEGATIVE

## 2018-06-18 MED ORDER — METOCLOPRAMIDE HCL 5 MG/ML IJ SOLN
10.0000 mg | Freq: Once | INTRAMUSCULAR | Status: AC
  Administered 2018-06-18: 10 mg via INTRAVENOUS
  Filled 2018-06-18: qty 2

## 2018-06-18 MED ORDER — SODIUM CHLORIDE 0.9% FLUSH
3.0000 mL | Freq: Once | INTRAVENOUS | Status: AC
  Administered 2018-06-18: 3 mL via INTRAVENOUS

## 2018-06-18 MED ORDER — SODIUM CHLORIDE 0.9 % IV BOLUS: 1000.0000 mL | Freq: Once | INTRAVENOUS | Status: DC

## 2018-06-18 MED ORDER — PANTOPRAZOLE SODIUM 40 MG IV SOLR
40.0000 mg | Freq: Once | INTRAVENOUS | Status: AC
  Administered 2018-06-18: 40 mg via INTRAVENOUS
  Filled 2018-06-18: qty 40

## 2018-06-18 MED ORDER — SODIUM CHLORIDE 0.9 % IV BOLUS
500.0000 mL | Freq: Once | INTRAVENOUS | Status: AC
  Administered 2018-06-18: 500 mL via INTRAVENOUS

## 2018-06-18 NOTE — ED Provider Notes (Signed)
[facility] DEPT Provider Note   CSN: [id number] Arrival date & time: 06/18/18  1358    History   Chief Complaint Chief Complaint  Patient presents with  . Abdominal Pain    HPI [name] is a 58 y.o. female.     The history is provided by the patient.  Abdominal Pain  Pain location:  Epigastric Pain quality: aching   Pain radiates to:  Does not radiate Pain severity:  Mild Onset quality:  Gradual Timing:  Intermittent Progression:  Waxing and waning Chronicity:  Recurrent Context: eating   Relieved by:  Antacids and OTC medications Worsened by:  Eating Associated symptoms: no anorexia, no chest pain, no chills, no constipation, no cough, no dysuria, no fatigue, no fever, no flatus, no hematuria, no melena, no nausea, no shortness of breath, no sore throat and no vomiting   Risk factors: has not had multiple surgeries     Past Medical History:  Diagnosis Date  . Acute on chronic diastolic CHF (congestive heart failure) ([place]) 04/11/2014  . Acute suppurative otitis media without spontaneous rupture of eardrum 02/04/2007   Centricity Description: OTITIS MEDIA, SUPPURATIVE, ACUTE, BILATERAL Qualifier: Diagnosis of  By: [name] MD, [name]  Centricity Description: OTITIS MEDIA, SUPPURATIVE, ACUTE Qualifier: Diagnosis of  By: [name] MD, [name]   . Allergic rhinitis   . Allergy   . Anemia   . Anxiety   . Arthritis   . CAD (coronary artery disease)    LHC (11/29/1999):  EF 60%; no significant CAD.  [name] Carpal tunnel syndrome   . Carpal tunnel syndrome 06/09/2006   Qualifier: Diagnosis of  By: Marca Ancona RMA, Lucy    . Chronic diastolic CHF (congestive heart failure) ([place])    a. Echo (01/21/11): Vigorous LVF, EF 65-70%, no RWMA, Gr 2 DD. b.  Echo (12/15): Moderate LVH, EF 29-93%, grade 2 diastolic dysfunction, mild LAE, normal RV function c. 01/2015: echo showing EF of 60-65% with mild LVH  . Common migraine   . COPD (chronic obstructive pulmonary  disease) ([place])   . Diastolic heart failure ([place])   . Diverticulitis   . GERD (gastroesophageal reflux disease)   . Hepatic steatosis   . Hepatomegaly   . Hiatal hernia   . Hyperlipidemia   . Hypertension   . IBS (irritable bowel syndrome)   . Internal hemorrhoids   . Morbid obesity ([place])   . OSA (obstructive sleep apnea)    CPAP dependent  . PNEUMONIA, ORGAN UNSPECIFIED 03/01/2009   Qualifier: Diagnosis of  By: [name] CMA, [name]    . Sinusitis, chronic 03/10/2012   CT sinuses 02/2012:  Chronic sinusitis with small A/F levels >> rx by ENT with prolonged augmentin F/u ct sinuses 04/2012:  Persistent sinus thickening >> rx with levaquin and clinda for 30 days.  Told to return for sinus scan in 6 weeks >> no showed for followup visit.  CT sinuses 11/2013:  Acute and chronic sinusitis noted.    . Sleep apnea    bi pap    Patient Active Problem List   Diagnosis Date Noted  . Bronchitis 02/12/2018  . Polyp of sigmoid colon   . CKD (chronic kidney disease) stage 3, GFR 30-59 ml/min ([facility]) 07/08/2016  . Cough variant asthma 07/31/2015  . Upper airway cough syndrome 04/11/2015  . Bronchitis, chronic obstructive w acute bronchitis ([place]) 04/11/2015  . Dyspnea   . Acute on chronic diastolic CHF (congestive heart failure), NYHA class 2 ([place]) 03/27/2015  .  Morbid obesity due to excess calories ([place]) 03/27/2015  . Dysphagia 03/27/2015  . Hyponatremia 03/27/2015  . Acute kidney injury ([place]) 03/27/2015  . Hypokalemia 03/27/2015  . Leukocytosis 03/27/2015  . COPD exacerbation ([place]) 03/27/2015  . Sinusitis, chronic 03/10/2012  . Hyperlipidemia 10/02/2006  . Obstructive sleep apnea 06/09/2006  . Essential hypertension 06/09/2006    Past Surgical History:  Procedure Laterality Date  . ABDOMINAL HYSTERECTOMY    . CESAREAN SECTION    . COLONOSCOPY    . COLONOSCOPY WITH PROPOFOL N/A 05/05/2017   Procedure: COLONOSCOPY WITH PROPOFOL;  Surgeon: Mauri Pole, MD;  Location: [facility] ENDOSCOPY;   Service: Endoscopy;  Laterality: N/A;  . NASAL SINUS SURGERY    . PARTIAL HYSTERECTOMY    . TONSILLECTOMY    . TUBAL LIGATION    . UVULOPALATOPHARYNGOPLASTY       OB History   No obstetric history on file.      Home Medications    Prior to Admission medications   Medication Sig Start Date End Date Taking? Authorizing Provider  albuterol (PROVENTIL) (2.5 MG/3ML) 0.083% nebulizer solution Take 3 mLs (2.5 mg total) by nebulization every 4 (four) hours as needed for wheezing or shortness of breath. 05/01/17   Tanda Rockers, MD  aspirin 81 MG EC tablet TAKE 1 TABLET BY MOUTH EVERY DAY Patient taking differently: Take 81 mg by mouth daily.  04/22/18   Skeet Latch, MD  benzonatate (TESSALON) 200 MG capsule Take 1 capsule (200 mg total) by mouth 3 (three) times daily as needed for cough. 10/13/17   Chesley Mires, MD  carvedilol (COREG) 12.5 MG tablet Take 1 tablet (12.5 mg total) by mouth 2 (two) times daily with a meal. 01/22/18   Skeet Latch, MD  chlorpheniramine (CHLOR-TRIMETON) 4 MG tablet Take 8 mg by mouth at bedtime. May add 1 extra every 4 hours as needed for drainage, drippy nose, throat clearing    [provider]  dexlansoprazole (DEXILANT) 60 MG capsule Take 1 capsule (60 mg total) by mouth every morning. Take 30 minutes before breakast 05/12/18   Nandigam, Venia Minks, MD  Dextromethorphan-Guaifenesin (MUCINEX DM MAXIMUM STRENGTH) 60-1200 MG TB12 Take 1 tablet by mouth at bedtime as needed (cough).     [provider]  dicyclomine (BENTYL) 10 MG capsule Take 1 capsule (10 mg total) by mouth 3 (three) times daily before meals for 5 days. 06/02/18 06/07/18  Duffy Bruce, MD  doxycycline (VIBRA-TABS) 100 MG tablet Take 1 tablet (100 mg total) by mouth 2 (two) times daily. 03/11/18   Tanda Rockers, MD  famotidine (PEPCID) 20 MG tablet Take 1 tablet (20 mg total) by mouth 2 (two) times daily for 7 days. 06/12/18 06/19/18  Couture, Cortni S, PA-C  fluticasone  (FLONASE) 50 MCG/ACT nasal spray Place 2 sprays into both nostrils daily. Patient taking differently: Place 2 sprays into both nostrils daily as needed for allergies.  05/08/18   Chesley Mires, MD  hydrALAZINE (APRESOLINE) 25 MG tablet TAKE 1 TABLET BY MOUTH THREE TIMES A DAY Patient taking differently: Take 25 mg by mouth 3 (three) times daily.  05/11/18   Skeet Latch, MD  isosorbide dinitrate (ISORDIL) 20 MG tablet Take 1 tablet (20 mg total) by mouth 3 (three) times daily. 02/20/18   Skeet Latch, MD  loperamide (IMODIUM) 2 MG capsule Take 1 capsule (2 mg total) by mouth 4 (four) times daily as needed for diarrhea or loose stools. 06/02/18   Duffy Bruce, MD  losartan (COZAAR) 25  MG tablet Take 50 mg by mouth daily. 04/02/18   [provider]  losartan (COZAAR) 50 MG tablet TAKE 1 TABLET BY MOUTH EVERY DAY 12/01/17   Skeet Latch, MD  meloxicam (MOBIC) 15 MG tablet Take 15 mg by mouth daily as needed for pain.  03/31/17   [provider]  metolazone (ZAROXOLYN) 2.5 MG tablet Take 2.5 mg by mouth daily as needed (FOR OVERNIGHT WEIGHT GAIN OF 3 lbs. or more).    [provider]  montelukast (SINGULAIR) 10 MG tablet TAKE 1 TABLET(10 MG) BY MOUTH AT BEDTIME Patient taking differently: Take 10 mg by mouth at bedtime.  07/25/17   Tanda Rockers, MD  ondansetron (ZOFRAN) 4 MG tablet Take 1 tablet (4 mg total) by mouth every 8 (eight) hours as needed for nausea or vomiting. 03/30/18   Harris, Abigail, PA-C  pantoprazole (PROTONIX) 20 MG tablet Take 2 tablets (40 mg total) by mouth daily for 7 days. 06/12/18 06/19/18  Couture, Cortni S, PA-C  polyethylene glycol powder (GLYCOLAX/MIRALAX) powder Use 2 capfuls in the morning and 1 capful in the evening and as directed Patient taking differently: Take 17-34 g by mouth daily as needed for moderate constipation.  03/24/17   Mauri Pole, MD  potassium chloride SA (K-DUR) 20 MEQ tablet Take 2 tablets (40 mEq total) by mouth  2 (two) times daily for 3 days. In addition to your usual supplement Patient not taking: Reported on 06/05/2018 06/02/18 06/05/18  Duffy Bruce, MD  potassium chloride SA (K-DUR,KLOR-CON) 20 MEQ tablet TAKE 3 TABLETS (60 MEQ) BY MOUTH THREE TIMES DAILY Patient taking differently: Take 180 mEq by mouth daily.  04/27/18   Skeet Latch, MD  promethazine (PHENERGAN) 25 MG tablet Take 1 tablet (25 mg total) by mouth every 6 (six) hours as needed for nausea or vomiting. 04/18/17   Dalia Heading, PA-C  Respiratory Therapy Supplies (FLUTTER) DEVI 1 application by Does not apply route as directed. 12/27/16   Tanda Rockers, MD  spironolactone (ALDACTONE) 25 MG tablet TAKE 1/2 TABLET BY MOUTH TWICE A DAY Patient not taking: No sig reported 12/02/17   Skeet Latch, MD  sucralfate (CARAFATE) 1 g tablet TAKE 1 TABLET (1 G TOTAL) BY MOUTH 4 (FOUR) TIMES DAILY - WITH MEALS AND AT BEDTIME FOR 5 DAYS. 06/17/18 06/22/18  Mauri Pole, MD  tiZANidine (ZANAFLEX) 4 MG tablet Take 4 mg by mouth every 6 (six) hours as needed for muscle spasms.  04/09/18   [provider]  topiramate (TOPAMAX) 25 MG tablet Take 25 mg by mouth 2 (two) times daily as needed (For migraines.).     [provider]  torsemide (DEMADEX) 20 MG tablet TAKE 2 TABLETS(40 MG) BY MOUTH TWICE DAILY Patient taking differently: Take 40 mg by mouth 2 (two) times daily.  06/02/17   Skeet Latch, MD  traMADol (ULTRAM) 50 MG tablet Take 1 tablet (50 mg total) by mouth every 6 (six) hours as needed. Patient taking differently: Take 50 mg by mouth every 6 (six) hours as needed for moderate pain.  03/30/18   Harris, Vernie Shanks, PA-C  UNABLE TO FIND Med Name: CPAP with sleep    [provider]    Family History Family History  Problem Relation Age of Onset  . Heart disease Mother   . Heart attack Mother   . Hypertension Mother   . Coronary artery disease Other        1st degree relatvie <50  . Breast cancer Other  aunt- ? paternal or maternal  . Asthma Sister   . Colon polyps Sister   . Stroke Neg Hx   . Colon cancer Neg Hx   . Esophageal cancer Neg Hx   . Rectal cancer Neg Hx   . Stomach cancer Neg Hx     Social History Social History   Tobacco Use  . Smoking status: Never Smoker  . Smokeless tobacco: Never Used  Substance Use Topics  . Alcohol use: No  . Drug use: No     Allergies   Sulfonamide derivatives; Doxycycline; Latex; Ciprofloxacin; Fish allergy; Hydrocortisone; and Neomycin   Review of Systems Review of Systems  Constitutional: Negative for chills, fatigue and fever.  HENT: Negative for ear pain and sore throat.   Eyes: Negative for pain and visual disturbance.  Respiratory: Negative for cough and shortness of breath.   Cardiovascular: Negative for chest pain and palpitations.  Gastrointestinal: Positive for abdominal pain. Negative for anorexia, constipation, flatus, melena, nausea and vomiting.  Genitourinary: Negative for dysuria and hematuria.  Musculoskeletal: Negative for arthralgias and back pain.  Skin: Negative for color change and rash.  Neurological: Negative for seizures and syncope.  All other systems reviewed and are negative.    Physical Exam Updated Vital Signs  ED Triage Vitals  Enc Vitals Group     BP --      Pulse Rate 06/18/18 1420 64     Resp 06/18/18 1420 17     Temp 06/18/18 1420 98.2 F (36.8 C)     Temp Source 06/18/18 1420 Oral     SpO2 06/18/18 1420 99 %     Weight 06/18/18 1412 289 lb 14.5 oz (131.5 kg)     Height 06/18/18 1412 5\' 3"  (1.6 m)     Head Circumference --      Peak Flow --      Pain Score --      Pain Loc --      Pain Edu? --      Excl. in [place]? --     Physical Exam Vitals signs and nursing note reviewed.  Constitutional:      General: She is not in acute distress.    Appearance: She is well-developed.  HENT:     Head: Normocephalic and atraumatic.     Mouth/Throat:     Mouth: Mucous membranes are  moist.     Pharynx: Oropharynx is clear.  Eyes:     Extraocular Movements: Extraocular movements intact.     Conjunctiva/sclera: Conjunctivae normal.     Pupils: Pupils are equal, round, and reactive to light.  Neck:     Musculoskeletal: Neck supple.  Cardiovascular:     Rate and Rhythm: Normal rate and regular rhythm.     Heart sounds: Normal heart sounds. No murmur.  Pulmonary:     Effort: Pulmonary effort is normal. No respiratory distress.     Breath sounds: Normal breath sounds.  Abdominal:     Palpations: Abdomen is soft.     Tenderness: There is abdominal tenderness in the epigastric area. There is no right CVA tenderness, left CVA tenderness, guarding or rebound. Negative signs include Murphy's sign, Rovsing's sign, McBurney's sign and psoas sign.  Skin:    General: Skin is warm and dry.     Capillary Refill: Capillary refill takes less than 2 seconds.  Neurological:     General: No focal deficit present.     Mental Status: She is alert.  Psychiatric:  Mood and Affect: Mood normal.      ED Treatments / Results  Labs (all labs ordered are listed, but only abnormal results are displayed) Labs Reviewed  CBC - Abnormal; Notable for the following components:      Result Value   RBC 3.81 (*)    Hemoglobin 11.9 (*)    All other components within normal limits  COMPREHENSIVE METABOLIC PANEL - Abnormal; Notable for the following components:   Glucose, Bld 111 (*)    Albumin 3.3 (*)    All other components within normal limits  SARS CORONAVIRUS 2 (HOSPITAL ORDER, [facility])  LIPASE, BLOOD  CBG MONITORING, ED  I-STAT BETA HCG BLOOD, ED ([facility], [facility], [facility] ONLY)    EKG None  Radiology No results found.  Procedures Procedures (including critical care time)  Medications Ordered in ED Medications  sodium chloride flush (NS) 0.9 % injection 3 mL (3 mLs Intravenous Given 06/18/18 1531)  sodium chloride 0.9 % bolus 500 mL (500 mLs Intravenous  New Bag/Given 06/18/18 1530)  pantoprazole (PROTONIX) injection 40 mg (40 mg Intravenous Given 06/18/18 1531)  metoCLOPramide (REGLAN) injection 10 mg (10 mg Intravenous Given 06/18/18 1530)     Initial Impression / Assessment and Plan / ED Course  I have reviewed the triage vital signs and the nursing notes.  Pertinent labs & imaging results that were available during my care of the patient were reviewed by me and considered in my medical decision making (see chart for details).     MALISSIA RABBANI is a 58 year old female with history of high cholesterol, hypertension, heart failure, gastritis, IBS who presents the ED with epigastric abdominal pain.  Patient with normal vitals.  No fever.  Patient with multiple visits this month for the same.  Recent right upper quadrant ultrasound and CT scan of the abdomen pelvis showed no acute findings.  No gallstones.  Patient is scheduled for an EGD on Monday.  She is on Carafate and other antacids.  States that pain is worse with eating.  There is no peritonitis on exam.  Has some mild tenderness in the epigastric region.  Overall is well-appearing.  Will repeat labs.  Give symptomatic care with IV fluids, IV antiemetics, IV Protonix.  Will swab for COVID eventually patient is discharged which I anticipate.  She was supposed to get a COVID testing today for clearance for EGD on Monday.  Labs overall unremarkable.  No significant electrolyte abnormality, anemia, kidney injury.  Lipase normal.  Gallbladder and liver enzymes within normal limits. Patient felt better after IV fluids, IV Protonix.  COVID test was sent for procedure clearance for Monday.  Recommend close follow-up with GI as scheduled for EGD.  Discharged in good condition.  No concern for intra-abdominal process such as cholecystitis.  This chart was dictated using voice recognition software.  Despite best efforts to proofread,  errors can occur which can change the documentation meaning.    Final  Clinical Impressions(s) / ED Diagnoses   Final diagnoses:  Abdominal pain, unspecified abdominal location    ED Discharge Orders    None       Lennice Sites, DO 06/18/18 1627

## 2018-06-18 NOTE — Telephone Encounter (Signed)
Pt daughter called in wanting to speak with the nurse about her mother condition. She stated that her mother has just been rushed to [facility] with her stomach issues that Dr.Nandigam knows about. She is wanting a call back from the nurse asap to discuss her mother.

## 2018-06-18 NOTE — Telephone Encounter (Signed)
I talked to patient multiple times and also discussed plan with her daughter. Extensive work up including CT abd & pelvis  is unrevealing for any significant pathology to explain her pain.  Plan to proceed with EGD, due to her high BMI and co morbidities, it can only be done in the hospital. Schedule is restricted with limited availability due to policies enforced during this COVID-19 pandemic.  We have done everything we can to help patient so far.

## 2018-06-18 NOTE — ED Triage Notes (Signed)
Patient BIB EMS from home with complaints of abdominal pain since March 2020 and increased dizziness when she stands. No orthostatic changes noted by EMS. Patient scheduled for endoscopy Monday June 22, 2018.   EMS VS: 122/74, 66SR, RR 24, 96% RA

## 2018-06-18 NOTE — ED Notes (Signed)
Bed: GO11 Expected date:  Expected time:  Means of arrival:  Comments: EMS-dizzy upon standing

## 2018-06-18 NOTE — Progress Notes (Signed)
Denies allergies to eggs or soy products. Denies complication of anesthesia or sedation. Denies use of weight loss medication. Denies use of O2.   Emmi instructions declined.  Pre-Visit was conducted by phone due to Covid 19.  Patient was in the ED while we did Pre-Visit. Instructions were reviewed. Instructions should available in My Chart since her procedure is only a few days away. Patient was encouraged to call if she had any questions or concerns.

## 2018-06-18 NOTE — Telephone Encounter (Signed)
Pt in the ER again today for cramping and right sided abd pain. Pt is still in the ER. Daughter calling wants to know what can be done today. Discussed with daughter that the ER doc will assess the pt and if they feel she should be admitted the hospitalist will admit and call for GI consult if they feel she needs one. Daughter states this is unacceptable and she feels her mother is getting the run around. States her mother has been having virtual visits and "this ain't cut it, she needs to be seen in person." Daughter wants to have a call back from Dr. Silverio Decamp. Daughter wants to know when she will get a call back, wants to speak to doctor today. Let her know I will send the message to Dr. Silverio Decamp.

## 2018-06-18 NOTE — Telephone Encounter (Signed)
Called charge nurse in the ER and let her know Dr. Woodward Ku suggestion/plan. Asked charge nurse to reinforce with the pt that she is to be NPO after midnight on Sunday for the procedure on Monday. Charge nurse in ER states she will notify pt.

## 2018-06-18 NOTE — Discharge Instructions (Addendum)
Follow up with GI on Monday. Covid test has been sent.

## 2018-06-19 ENCOUNTER — Encounter (HOSPITAL_COMMUNITY): Payer: Self-pay | Admitting: *Deleted

## 2018-06-19 ENCOUNTER — Telehealth: Payer: Self-pay | Admitting: Gastroenterology

## 2018-06-19 ENCOUNTER — Other Ambulatory Visit: Payer: Self-pay

## 2018-06-19 NOTE — Progress Notes (Signed)
Called patient to go over preop instructions. Pt agreed to stay quarantine over the weekend. Pt denies fever or other cold or flu symptoms at this time. She will call the office if anything changes.

## 2018-06-19 NOTE — Telephone Encounter (Signed)
Pt instructions sent via My Chart to pt this morning- we discussed egd prep instructions - arrival time- NPO after midnight and pt informed where to check in Monday 6-1 am at 745 am- returned verbal understanding- encouraged to call with questions

## 2018-06-19 NOTE — Progress Notes (Signed)
SPOKE W/  _with patient via phone     SCREENING SYMPTOMS OF COVID 19:   COUGH--No  RUNNY NOSE---No   SORE THROAT---No  NASAL CONGESTION----No  SNEEZING----No  SHORTNESS OF BREATH---No  DIFFICULTY BREATHING---No  TEMP >100.0 -----No  UNEXPLAINED BODY ACHES------No  CHILLS -------- No  HEADACHES ---------No  LOSS OF SMELL/ TASTE --------No    HAVE YOU OR ANY FAMILY MEMBER TRAVELLED PAST 14 DAYS OUT OF THE   COUNTY---No STATE----No COUNTRY----No  HAVE YOU OR ANY FAMILY MEMBER BEEN EXPOSED TO ANYONE WITH COVID 19? No

## 2018-06-22 ENCOUNTER — Other Ambulatory Visit: Payer: Self-pay

## 2018-06-22 ENCOUNTER — Encounter (HOSPITAL_COMMUNITY): Payer: Self-pay | Admitting: *Deleted

## 2018-06-22 ENCOUNTER — Encounter (HOSPITAL_COMMUNITY)
Admission: RE | Disposition: A | Discharge status: Home / Self Care | Payer: Self-pay | Source: Home / Self Care | Attending: Gastroenterology

## 2018-06-22 ENCOUNTER — Telehealth: Payer: Self-pay | Admitting: Gastroenterology

## 2018-06-22 ENCOUNTER — Ambulatory Visit (HOSPITAL_COMMUNITY)
Admission: RE | Admit: 2018-06-22 | Discharge: 2018-06-22 | Disposition: A | Discharge status: Home / Self Care | Payer: 59 | Attending: Gastroenterology | Admitting: Gastroenterology

## 2018-06-22 ENCOUNTER — Other Ambulatory Visit: Payer: Self-pay | Admitting: Gastroenterology

## 2018-06-22 ENCOUNTER — Ambulatory Visit (HOSPITAL_COMMUNITY): Payer: 59 | Admitting: Anesthesiology

## 2018-06-22 DIAGNOSIS — R1013 Epigastric pain: Secondary | ICD-10-CM

## 2018-06-22 DIAGNOSIS — Z791 Long term (current) use of non-steroidal anti-inflammatories (NSAID): Secondary | ICD-10-CM | POA: Insufficient documentation

## 2018-06-22 DIAGNOSIS — Z79899 Other long term (current) drug therapy: Secondary | ICD-10-CM | POA: Insufficient documentation

## 2018-06-22 DIAGNOSIS — K581 Irritable bowel syndrome with constipation: Secondary | ICD-10-CM | POA: Insufficient documentation

## 2018-06-22 DIAGNOSIS — E785 Hyperlipidemia, unspecified: Secondary | ICD-10-CM | POA: Diagnosis not present

## 2018-06-22 DIAGNOSIS — M1990 Unspecified osteoarthritis, unspecified site: Secondary | ICD-10-CM | POA: Insufficient documentation

## 2018-06-22 DIAGNOSIS — K219 Gastro-esophageal reflux disease without esophagitis: Secondary | ICD-10-CM | POA: Insufficient documentation

## 2018-06-22 DIAGNOSIS — R109 Unspecified abdominal pain: Secondary | ICD-10-CM | POA: Diagnosis not present

## 2018-06-22 DIAGNOSIS — I2510 Atherosclerotic heart disease of native coronary artery without angina pectoris: Secondary | ICD-10-CM | POA: Diagnosis not present

## 2018-06-22 DIAGNOSIS — J449 Chronic obstructive pulmonary disease, unspecified: Secondary | ICD-10-CM | POA: Diagnosis not present

## 2018-06-22 DIAGNOSIS — K2990 Gastroduodenitis, unspecified, without bleeding: Secondary | ICD-10-CM | POA: Diagnosis not present

## 2018-06-22 DIAGNOSIS — K259 Gastric ulcer, unspecified as acute or chronic, without hemorrhage or perforation: Secondary | ICD-10-CM | POA: Diagnosis not present

## 2018-06-22 DIAGNOSIS — K449 Diaphragmatic hernia without obstruction or gangrene: Secondary | ICD-10-CM | POA: Insufficient documentation

## 2018-06-22 DIAGNOSIS — Z91040 Latex allergy status: Secondary | ICD-10-CM | POA: Diagnosis not present

## 2018-06-22 DIAGNOSIS — G43909 Migraine, unspecified, not intractable, without status migrainosus: Secondary | ICD-10-CM | POA: Diagnosis not present

## 2018-06-22 DIAGNOSIS — Z91013 Allergy to seafood: Secondary | ICD-10-CM | POA: Insufficient documentation

## 2018-06-22 DIAGNOSIS — G4733 Obstructive sleep apnea (adult) (pediatric): Secondary | ICD-10-CM | POA: Diagnosis not present

## 2018-06-22 DIAGNOSIS — K2950 Unspecified chronic gastritis without bleeding: Secondary | ICD-10-CM | POA: Diagnosis not present

## 2018-06-22 DIAGNOSIS — Z881 Allergy status to other antibiotic agents status: Secondary | ICD-10-CM | POA: Diagnosis not present

## 2018-06-22 DIAGNOSIS — Z882 Allergy status to sulfonamides status: Secondary | ICD-10-CM | POA: Diagnosis not present

## 2018-06-22 DIAGNOSIS — Z7982 Long term (current) use of aspirin: Secondary | ICD-10-CM | POA: Insufficient documentation

## 2018-06-22 DIAGNOSIS — K2970 Gastritis, unspecified, without bleeding: Secondary | ICD-10-CM

## 2018-06-22 DIAGNOSIS — I110 Hypertensive heart disease with heart failure: Secondary | ICD-10-CM | POA: Insufficient documentation

## 2018-06-22 DIAGNOSIS — I5032 Chronic diastolic (congestive) heart failure: Secondary | ICD-10-CM | POA: Insufficient documentation

## 2018-06-22 DIAGNOSIS — G5600 Carpal tunnel syndrome, unspecified upper limb: Secondary | ICD-10-CM | POA: Diagnosis not present

## 2018-06-22 HISTORY — PX: ESOPHAGOGASTRODUODENOSCOPY (EGD) WITH PROPOFOL: SHX5813

## 2018-06-22 HISTORY — PX: BIOPSY: SHX5522

## 2018-06-22 SURGERY — ESOPHAGOGASTRODUODENOSCOPY (EGD) WITH PROPOFOL
Anesthesia: Monitor Anesthesia Care

## 2018-06-22 MED ORDER — LACTATED RINGERS IV SOLN
INTRAVENOUS | Status: DC
  Administered 2018-06-22: 1000 mL via INTRAVENOUS

## 2018-06-22 MED ORDER — SODIUM CHLORIDE 0.9 % IV SOLN: INTRAVENOUS | Status: DC

## 2018-06-22 MED ORDER — PROPOFOL 10 MG/ML IV BOLUS
INTRAVENOUS | Status: DC | PRN
  Administered 2018-06-22 (×3): 20 mg via INTRAVENOUS

## 2018-06-22 MED ORDER — PROPOFOL 10 MG/ML IV BOLUS
INTRAVENOUS | Status: AC
  Filled 2018-06-22: qty 20

## 2018-06-22 MED ORDER — SUCRALFATE 1 G PO TABS: 1.0000 g | ORAL_TABLET | Freq: Three times a day (TID) | ORAL | 0 refills | Status: DC

## 2018-06-22 MED ORDER — PROPOFOL 500 MG/50ML IV EMUL
INTRAVENOUS | Status: DC | PRN
  Administered 2018-06-22: 100 ug/kg/min via INTRAVENOUS

## 2018-06-22 SURGICAL SUPPLY — 14 items

## 2018-06-22 NOTE — Telephone Encounter (Signed)
Beth I think you are already working on this??

## 2018-06-22 NOTE — Telephone Encounter (Signed)
Pt requested a call back to discuss future appointments. She stated that she was supposed to "get a call from the nurse to schedule two appointments."    She would like to discuss a gastroparesis diet and would like a refill on carafate.

## 2018-06-22 NOTE — Transfer of Care (Signed)
Immediate Anesthesia Transfer of Care Note  Patient: [name]  Procedure(s) Performed: ESOPHAGOGASTRODUODENOSCOPY (EGD) WITH PROPOFOL (N/A ) BIOPSY  Patient Location: Endoscopy Unit  Anesthesia Type:MAC  Level of Consciousness: awake, alert , oriented and patient cooperative  Airway & Oxygen Therapy: Patient Spontanous Breathing and Patient connected to nasal cannula oxygen  Post-op Assessment: Report given to RN and Post -op Vital signs reviewed and stable  Post vital signs: Reviewed and stable  Last Vitals:  Vitals Value Taken Time  BP    Temp    Pulse    Resp    SpO2      Last Pain:  Vitals:   06/22/18 0815  TempSrc: Oral  PainSc: 0-No pain         Complications: No apparent anesthesia complications

## 2018-06-22 NOTE — Telephone Encounter (Signed)
GES 07/08/18 at 7:30 am [facility]. Arrive at 7:15 am. No stomach medications for 8 hours. Patient is aware of the appointment and instructions.  Do you want her scheduled for the next EGD on the next hospital block in July?

## 2018-06-22 NOTE — Telephone Encounter (Signed)
Yes please schedule her for July.  Thank you

## 2018-06-22 NOTE — Op Note (Signed)
[facility] Patient Name: [name] Procedure Date: 06/22/2018 MRN: [id number] Attending MD: Mauri Pole , MD Date of Birth: [birth year] CSN: [id number] Age: 58 Admit Type: Outpatient Procedure:                Upper GI endoscopy Indications:              Epigastric abdominal pain, Upper abdominal pain Providers:                Mauri Pole, MD, Cleda Daub, RN, Cherylynn Ridges, Technician, Dellie Catholic Referring MD:              Medicines:                Monitored Anesthesia Care Complications:            No immediate complications. Estimated Blood Loss:     Estimated blood loss was minimal. Procedure:                Pre-Anesthesia Assessment:                           - Prior to the procedure, a History and Physical                            was performed, and patient medications and                            allergies were reviewed. The patient's tolerance of                            previous anesthesia was also reviewed. The risks                            and benefits of the procedure and the sedation                            options and risks were discussed with the patient.                            All questions were answered, and informed consent                            was obtained. Prior Anticoagulants: The patient has                            taken no previous anticoagulant or antiplatelet                            agents. ASA Grade Assessment: III - A patient with                            severe systemic disease. After reviewing the risks  and benefits, the patient was deemed in                            satisfactory condition to undergo the procedure.                           After obtaining informed consent, the endoscope was                            passed under direct vision. Throughout the                            procedure, the patient's blood pressure, pulse,  and                            oxygen saturations were monitored continuously. The                            GIF-H190 ([id number]) Olympus gastroscope was                            introduced through the mouth, and advanced to the                            second part of duodenum. The upper GI endoscopy was                            technically difficult and complex due to presence                            of food. The patient tolerated the procedure well. Scope In: Scope Out: Findings:      The examined esophagus was normal.      The Z-line was regular and was found 38 cm from the incisors. Hiatal       hernia sliding      A medium amount of food (residue) was found in the gastric body.      Patchy moderate inflammation characterized by congestion (edema),       erosions, erythema, linear erosions and shallow ulcerations was found in       the entire examined stomach. Biopsies were taken with a cold forceps for       Helicobacter pylori testing.      Food (residue) was found in the duodenal bulb. Otherwise no mucosal       abnormality in the visualized area Impression:               - Normal esophagus.                           - Z-line regular, 38 cm from the incisors. Sliding                            hiatal hernia                           - A medium amount of food (  residue) in the stomach.                           - Gastritis. Biopsied.                           - Retained food in the duodenum. Moderate Sedation:      Not Applicable - Patient had care per Anesthesia. Recommendation:           - Gastroparesis diet.                           - Continue present medications.                           - No ibuprofen, naproxen, or other non-steroidal                            anti-inflammatory drugs.                           - Await pathology results.                           - Repeat upper endoscopy in 4-6 weeks next                            available appointment because  the visualization was                            limited. Hold solid food for 12 hours prior to the                            procedure.                           - Do a 4 hour gastric emptying study at appointment                            to be scheduled.                           - Dexilant 60mg  daily                           - Carafate 1gm before meals and at bedtime Procedure Code(s):        --- Professional ---                           [id number], Esophagogastroduodenoscopy, flexible,                            transoral; with biopsy, single or multiple Diagnosis Code(s):        --- Professional ---                           K29.70, Gastritis, unspecified, without bleeding  R10.13, Epigastric pain                           R10.10, Upper abdominal pain, unspecified CPT copyright 2019 [employer] Medical Association. All rights reserved. The codes documented in this report are preliminary and upon coder review may  be revised to meet current compliance requirements. Mauri Pole, MD 06/22/2018 9:46:02 AM This report has been signed electronically. Number of Addenda: 0

## 2018-06-22 NOTE — Anesthesia Preprocedure Evaluation (Addendum)
Anesthesia Evaluation  Patient identified by MRN, date of birth, ID band Patient awake    Reviewed: Allergy & Precautions, NPO status , Patient's Chart, lab work & pertinent test results, reviewed documented beta blocker date and time   Airway Mallampati: II  TM Distance: >3 FB Neck ROM: Full    Dental  (+) Teeth Intact, Dental Advisory Given   Pulmonary asthma , sleep apnea and Continuous Positive Airway Pressure Ventilation , COPD,    Pulmonary exam normal breath sounds clear to auscultation       Cardiovascular hypertension, Pt. on home beta blockers and Pt. on medications + CAD and +CHF  Normal cardiovascular exam Rhythm:Regular Rate:Normal     Neuro/Psych  Headaches, PSYCHIATRIC DISORDERS Anxiety    GI/Hepatic Neg liver ROS, hiatal hernia, GERD  Medicated and Poorly Controlled,  Endo/Other  Morbid obesity  Renal/GU Renal InsufficiencyRenal disease     Musculoskeletal  (+) Arthritis ,   Abdominal (+) + obese,   Peds  Hematology  (+) Blood dyscrasia, anemia ,   Anesthesia Other Findings Day of surgery medications reviewed with the patient.  Reproductive/Obstetrics                            Anesthesia Physical Anesthesia Plan  ASA: III  Anesthesia Plan: MAC   Post-op Pain Management:    Induction: Intravenous  PONV Risk Score and Plan: 2 and Propofol infusion and Treatment may vary due to age or medical condition  Airway Management Planned: Nasal Cannula  Additional Equipment:   Intra-op Plan:   Post-operative Plan:   Informed Consent: I have reviewed the patients History and Physical, chart, labs and discussed the procedure including the risks, benefits and alternatives for the proposed anesthesia with the patient or authorized representative who has indicated his/her understanding and acceptance.     Dental advisory given  Plan Discussed with: CRNA and  Anesthesiologist  Anesthesia Plan Comments:         Anesthesia Quick Evaluation

## 2018-06-22 NOTE — Discharge Instructions (Signed)
YOU HAD AN ENDOSCOPIC PROCEDURE TODAY: Refer to the procedure report and other information in the discharge instructions given to you for any specific questions about what was found during the examination. If this information does not answer your questions, please call [place] office at [phone] to clarify.   YOU SHOULD EXPECT: Some feelings of bloating in the abdomen. Passage of more gas than usual. Walking can help get rid of the air that was put into your GI tract during the procedure and reduce the bloating. If you had a lower endoscopy (such as a colonoscopy or flexible sigmoidoscopy) you may notice spotting of blood in your stool or on the toilet paper. Some abdominal soreness may be present for a day or two, also.  DIET: Your first meal following the procedure should be a light meal and then it is ok to progress to your normal diet. A half-sandwich or bowl of soup is an example of a good first meal. Heavy or fried foods are harder to digest and may make you feel nauseous or bloated. Drink plenty of fluids but you should avoid alcoholic beverages for 24 hours. If you had a esophageal dilation, please see attached instructions for diet.    ACTIVITY: Your care partner should take you home directly after the procedure. You should plan to take it easy, moving slowly for the rest of the day. You can resume normal activity the day after the procedure however YOU SHOULD NOT DRIVE, use power tools, machinery or perform tasks that involve climbing or major physical exertion for 24 hours (because of the sedation medicines used during the test).   SYMPTOMS TO REPORT IMMEDIATELY: A gastroenterologist can be reached at any hour. Please call [phone]  for any of the following symptoms:   Following upper endoscopy (EGD, EUS, ERCP, esophageal dilation) Vomiting of blood or coffee ground material  New, significant abdominal pain  New, significant chest pain or pain under the shoulder blades  Painful or  persistently difficult swallowing  New shortness of breath  Black, tarry-looking or red, bloody stools  FOLLOW UP:  If any biopsies were taken you will be contacted by phone or by letter within the next 1-3 weeks. Call [phone]  if you have not heard about the biopsies in 3 weeks.  Please also call with any specific questions about appointments or follow up tests.  

## 2018-06-22 NOTE — Anesthesia Postprocedure Evaluation (Signed)
Anesthesia Post Note  Patient: CAMBRE MATSON  Procedure(s) Performed: ESOPHAGOGASTRODUODENOSCOPY (EGD) WITH PROPOFOL (N/A ) BIOPSY     Patient location during evaluation: Endoscopy Anesthesia Type: MAC Level of consciousness: awake and alert Pain management: pain level controlled Vital Signs Assessment: post-procedure vital signs reviewed and stable Respiratory status: spontaneous breathing, nonlabored ventilation and respiratory function stable Cardiovascular status: stable and blood pressure returned to baseline Postop Assessment: no apparent nausea or vomiting Anesthetic complications: no    Last Vitals:  Vitals:   06/22/18 0950 06/22/18 1000  BP: 120/65 136/85  Pulse: 78 71  Resp: (!) 8 13  Temp:    SpO2: 94% 97%    Last Pain:  Vitals:   06/22/18 0942  TempSrc: Oral  PainSc: 0-No pain                 Catalina Gravel

## 2018-06-22 NOTE — Anesthesia Procedure Notes (Signed)
Date/Time: 06/22/2018 9:25 AM Performed by: Claudia Desanctis, CRNA Pre-anesthesia Checklist: Patient identified, Emergency Drugs available, Patient being monitored, Suction available and Timeout performed Patient Re-evaluated:Patient Re-evaluated prior to induction

## 2018-06-22 NOTE — Interval H&P Note (Signed)
History and Physical Interval Note:  06/22/2018 9:10 AM  [name]  has presented today for surgery, with the diagnosis of Abdominal pain.  The various methods of treatment have been discussed with the patient and family. After consideration of risks, benefits and other options for treatment, the patient has consented to  Procedure(s): ESOPHAGOGASTRODUODENOSCOPY (EGD) WITH PROPOFOL (N/A) as a surgical intervention.  The patient's history has been reviewed, patient examined, no change in status, stable for surgery.  I have reviewed the patient's chart and labs.  Questions were answered to the patient's satisfaction.     Marylou Wages

## 2018-06-23 ENCOUNTER — Telehealth: Payer: Self-pay | Admitting: Gastroenterology

## 2018-06-23 ENCOUNTER — Other Ambulatory Visit: Payer: Self-pay

## 2018-06-23 ENCOUNTER — Ambulatory Visit: Payer: 59 | Admitting: Gastroenterology

## 2018-06-23 DIAGNOSIS — E8881 Metabolic syndrome: Secondary | ICD-10-CM

## 2018-06-23 DIAGNOSIS — K3184 Gastroparesis: Secondary | ICD-10-CM

## 2018-06-23 NOTE — Telephone Encounter (Signed)
EGD at [facility] on Tuesday 07/28/18 arrive at 7:00 am for a 8:30 am start time. Message with the information on the procedure date left on her voicemail. Offered to refer to dietician if she is interested.  Information on gastroparesis diet mailed. Instructions for procedure mailed.

## 2018-06-23 NOTE — Telephone Encounter (Signed)
Pt return call °

## 2018-06-23 NOTE — Telephone Encounter (Signed)
alled patient. Got her voicemail. Asked her to call me again if she has questions. Okay to leave me a message if she wants a referral to dietician about the gastroparesis diet.

## 2018-06-23 NOTE — Telephone Encounter (Signed)
Patient agrees to the nutritional counseling referral.

## 2018-06-24 ENCOUNTER — Encounter (HOSPITAL_COMMUNITY): Payer: Self-pay | Admitting: Gastroenterology

## 2018-06-25 ENCOUNTER — Other Ambulatory Visit: Payer: Self-pay

## 2018-06-25 ENCOUNTER — Telehealth: Payer: Self-pay | Admitting: Gastroenterology

## 2018-06-25 ENCOUNTER — Encounter: Payer: 59 | Attending: Gastroenterology | Admitting: Registered"

## 2018-06-25 ENCOUNTER — Encounter: Payer: Self-pay | Admitting: Registered"

## 2018-06-25 DIAGNOSIS — E8881 Metabolic syndrome: Secondary | ICD-10-CM | POA: Insufficient documentation

## 2018-06-25 DIAGNOSIS — K3184 Gastroparesis: Secondary | ICD-10-CM

## 2018-06-25 NOTE — Telephone Encounter (Signed)
Pt would like letter regarding info for her gastric emptying scheduled 07/08/18.

## 2018-06-25 NOTE — Progress Notes (Signed)
Medical Nutrition Therapy:  Appt start time: 1532 end time:  1635  I connected with  [name] on 06/25/18 by telephone and verified that I was speaking with the correct person using two identifiers (Full name and DOB).   I discussed the limitations of evaluation and management by phone visit. The patient expressed understanding and agreed to proceed.  Assessment:  Primary concerns today: Pt referred due to gastroparesis. Pt reports that she wants help with how to eat well. Pt reports she has been having GI trouble over past 2 years. Pt was recently dx with gastroparesis. Pt reports that d/t her CHF she has a fluid restriction of no more than 6 x 16 oz bottles of water per pt which equals 3L. Reports she is now drinking water and Gatorade only. Pt reports she has been eating 1 time per day and she does not want to eat more times than she has to because her stomach hurts whenever she eats. Pt wants to know if she should include high fiber foods to help with constipation because she reports her daughter told her she needs more to. Pt reports her daughter told her to try sea moss gel and ginger tea. Pt wants to know if she is these are appropriate for her. Pt reports that her son can help her get groceries from the store. Pt reports she wakes around 9-10 AM and goes to bed around 12-1 AM.   Food Allergies/Intolerances: mackerel.  GI Concerns: stomach pain, nausea, constipation.   Pertinent Lab Values: N/A  Preferred Learning Style:   No preference indicated   Learning Readiness:  Ready  MEDICATIONS: See list. Reviewed.    DIETARY INTAKE:  Usual eating pattern includes 1 meals and 0 snacks per day.   Common foods: grilled chicken.  Avoided foods: fried foods; eggs; milk (does not like taste of milk).    Typical Snacks: none reported.     Typical Beverages: Gatorade, water. Reports a fluid restriction of 3L.  Location of Meals: living room but varies.   Electronics Present at  [employer]: Yes: TV.  24-hr recall:  B ( AM): None reported.  Snk ( AM): None reported.  L ( PM): None reported.  Snk ( PM): None reported.  D (6 PM): 2 small ears of corn on the cob, 2 chicken wings, 2 TB baked sweet potato Snk ( PM): None reported. Beverages: 48 oz Gatorade; 32 oz water.   Usual physical activity: None reported.  Minutes/Week: None reported.   Progress Towards Goal(s):  In progress.   Nutritional Diagnosis:  NB-1.1 Food and nutrition-related knowledge deficit As related to gastroparesis diet.  As evidenced by no previous nutrition education with a dietitian . NI-5.11.1 Predicted suboptimal nutrient intake As related to recent dx of gastroparesis .  As evidenced by pt's reported dietary recall and habits .    Intervention:  Nutrition counseling provided. Dietitian provided education regarding explanation of gastroparesis and MNT for gastroparesis. Discussed how including foods that require less processing time/work of the digestive system can help reduce pain with eating. Discussed importance of eating multiple small meals rather than eating all food at one meal as spreading out our food throughout the day will result in less work and thus less processing time for our GI tract and help reduce negative side effects. Recommended including 2 Boost Plus (360 kcal, 14g pro per 8 oz bottle) per day along with still including easily digested solids/semi solids to help ensure proper nutrition. Discussed that  Boost will need to be included in fluid restriction as pt reports being restricted. Advised that pt avoid trying sea moss gel at this time and advised ginger tea could be included in small amounts as long as caffeine free, however, cautioned that we do not want to fill up on liquids and not have room for solid foods which will provide more nutrition. Discussed eating small amount of food every 2 hours up until 4 hours before bed and avoiding drinking liquids before eating. Discussed  foods recommended and not recommended and discussed including a small portion of protein/starch source at meals to help get in essential nutrition needs. Advised pt that while including high fiber foods is typically recommended for constipation when d/t other causes, foods high in fiber should be avoided with constipation d/t gastroparesis as they can cause adverse effects. Pt appeared agreeable to information/goals discussed.     Teaching Method Utilized: Visual Auditory  Handouts given during visit include:  MNT for Gastroparesis   Barriers to learning/adherence to lifestyle change: None reported.  Demonstrated degree of understanding via:  Teach Back   Monitoring/Evaluation:  Dietary intake, exercise, and body weight in 2 week(s).

## 2018-06-25 NOTE — Telephone Encounter (Signed)
Letter emailed as requested.

## 2018-06-27 NOTE — Patient Instructions (Addendum)
Instructions/Goals:   Include 4-6 small meals: -Eat a small meal about every 2 hours up until 4 hours before bed.This can include having some of the Boost Plus as well.  Do not want to eat 3-4 hours before lying down.  -Include low fiber, low fat, easily digestible foods (see handout recommended/not recommended lists)  -Avoid drinking right before meal -Include protein and starch at each meal (see list for recommended starches and proteins)  Recommend 2 Boost Plus spread throughout the day -Want to also include solid/semi-solid foods as well  Contact dietitian with any questions/concerns via phone, My Chart, or email.

## 2018-06-29 ENCOUNTER — Telehealth: Payer: Self-pay | Admitting: Registered"

## 2018-06-29 NOTE — Telephone Encounter (Signed)
Patient Visit Summary from televisit on 06/25/18 and Gastroparesis MNT information discussed during visit mailed to patient.

## 2018-06-30 ENCOUNTER — Telehealth: Payer: Self-pay | Admitting: Gastroenterology

## 2018-06-30 NOTE — Telephone Encounter (Signed)
Spoke with the patient. Reviewed instructions for the GES. No pre-test required at this point.

## 2018-07-01 ENCOUNTER — Encounter: Payer: Self-pay | Admitting: *Deleted

## 2018-07-02 ENCOUNTER — Encounter: Payer: Self-pay | Admitting: Gastroenterology

## 2018-07-02 ENCOUNTER — Ambulatory Visit (INDEPENDENT_AMBULATORY_CARE_PROVIDER_SITE_OTHER): Payer: 59 | Admitting: Gastroenterology

## 2018-07-02 ENCOUNTER — Other Ambulatory Visit: Payer: Self-pay

## 2018-07-02 VITALS — Ht 63.0 in | Wt 289.0 lb

## 2018-07-02 DIAGNOSIS — K2990 Gastroduodenitis, unspecified, without bleeding: Secondary | ICD-10-CM

## 2018-07-02 DIAGNOSIS — K2970 Gastritis, unspecified, without bleeding: Secondary | ICD-10-CM | POA: Diagnosis not present

## 2018-07-02 DIAGNOSIS — R1010 Upper abdominal pain, unspecified: Secondary | ICD-10-CM

## 2018-07-02 DIAGNOSIS — K5904 Chronic idiopathic constipation: Secondary | ICD-10-CM

## 2018-07-02 DIAGNOSIS — K3184 Gastroparesis: Secondary | ICD-10-CM | POA: Diagnosis not present

## 2018-07-02 DIAGNOSIS — N183 Chronic kidney disease, stage 3 unspecified: Secondary | ICD-10-CM

## 2018-07-02 NOTE — Patient Instructions (Addendum)
Continue small frequent meals  Follow-up with PMD for testing to exclude diabetes  Already scheduled for gastric emptying scan and repeat EGD  Continue MiraLAX as needed  Follow-up telemedicine visit in 2 months  I appreciate the  opportunity to care for you  Thank You   Harl Bowie , MD

## 2018-07-02 NOTE — Progress Notes (Signed)
[name]    [id number]    1960-05-19  Primary Care Physician:Boyd, Dola Factor, MD  Referring Physician: Bartholome Bill, MD [address],  [place] [address]  This service was provided via audio and video telemedicine (Doximity) due to [place] 19 pandemic.  Patient location: Home Provider location: Office Used 2 patient identifiers to confirm the correct person. Explained the limitations in evaluation and management via telemedicine. Patient is aware of potential medical charges for this visit.  Patient consented to this virtual visit.  The persons participating in this telemedicine service were myself and the patient   Chief complaint: Left upper quadrant abdominal pain  HPI:  58 yr old female with morbid obesity, metabolic syndrome, obstructive sleep apnea, hypertension here for follow-up visit. Patient was laying down in bed during the entire video call.  She is trying to drink boost and eat small meals, her symptoms are slightly better no longer vomiting but continues to have intermittent left upper quadrant abdominal discomfort, nausea and also has decreased appetite Is having regular bowel movement.  Denies any melena or blood per rectum  EGD June 22, 2018 with moderate amount of residue food in the body and fundus of stomach with limited visualization of the lining, gastritis, biopsies negative for H. pylori   Chronic GERD was on Protonix, switch to [facility] April 2020 and started on Pepcid at bedtime  Dicyclomine for IBS  CT abdomen pelvis March 30, 2018: No acute intra-abdominal or pelvic abnormality, moderate fat-containing umbilical hernia  Colonoscopy April 2019 with removal of 11 mm hamartomatous polyp in sigmoid colon  Colonoscopy November 04, 2003 normal with no polyps  Outpatient Encounter Medications as of 07/02/2018  Medication Sig  . albuterol (PROVENTIL) (2.5 MG/3ML) 0.083% nebulizer solution Take 3  mLs (2.5 mg total) by nebulization every 4 (four) hours as needed for wheezing or shortness of breath.  [name] aspirin 81 MG EC tablet TAKE 1 TABLET BY MOUTH EVERY DAY (Patient taking differently: Take 81 mg by mouth daily. )  . benzonatate (TESSALON) 200 MG capsule Take 1 capsule (200 mg total) by mouth 3 (three) times daily as needed for cough.  . carvedilol (COREG) 12.5 MG tablet Take 1 tablet (12.5 mg total) by mouth 2 (two) times daily with a meal.  . chlorpheniramine (CHLOR-TRIMETON) 4 MG tablet Take 8 mg by mouth at bedtime. May add 1 extra every 4 hours as needed for drainage, drippy nose, throat clearing  . dexlansoprazole (DEXILANT) 60 MG capsule Take 1 capsule (60 mg total) by mouth every morning. Take 30 minutes before breakast  . Dextromethorphan-Guaifenesin (MUCINEX DM MAXIMUM STRENGTH) 60-1200 MG TB12 Take 1 tablet by mouth at bedtime as needed (cough).   . doxycycline (VIBRA-TABS) 100 MG tablet Take 1 tablet (100 mg total) by mouth 2 (two) times daily.  . fluticasone (FLONASE) 50 MCG/ACT nasal spray Place 2 sprays into both nostrils daily. (Patient taking differently: Place 2 sprays into both nostrils daily as needed for allergies. )  . hydrALAZINE (APRESOLINE) 25 MG tablet TAKE 1 TABLET BY MOUTH THREE TIMES A DAY (Patient taking differently: Take 25 mg by mouth 3 (three) times daily. )  . isosorbide dinitrate (ISORDIL) 20 MG tablet Take 1 tablet (20 mg total) by mouth 3 (three) times daily.  [name] loperamide (IMODIUM) 2 MG capsule Take 1 capsule (2 mg total) by mouth 4 (four) times daily as needed for diarrhea or loose  stools.  [name] losartan (COZAAR) 25 MG tablet Take 50 mg by mouth daily.  [name] losartan (COZAAR) 50 MG tablet TAKE 1 TABLET BY MOUTH EVERY DAY  . metolazone (ZAROXOLYN) 2.5 MG tablet Take 2.5 mg by mouth daily as needed (FOR OVERNIGHT WEIGHT GAIN OF 3 lbs. or more).  . montelukast (SINGULAIR) 10 MG tablet TAKE 1 TABLET(10 MG) BY MOUTH AT BEDTIME (Patient taking differently: Take 10 mg  by mouth at bedtime. )  . ondansetron (ZOFRAN) 4 MG tablet Take 1 tablet (4 mg total) by mouth every 8 (eight) hours as needed for nausea or vomiting.  . polyethylene glycol powder (GLYCOLAX/MIRALAX) powder Use 2 capfuls in the morning and 1 capful in the evening and as directed (Patient taking differently: Take 17-34 g by mouth daily as needed for moderate constipation. )  . potassium chloride SA (K-DUR,KLOR-CON) 20 MEQ tablet TAKE 3 TABLETS (60 MEQ) BY MOUTH THREE TIMES DAILY (Patient taking differently: Take 180 mEq by mouth daily. )  . promethazine (PHENERGAN) 25 MG tablet Take 1 tablet (25 mg total) by mouth every 6 (six) hours as needed for nausea or vomiting.  [name] Respiratory Therapy Supplies (FLUTTER) DEVI 1 application by Does not apply route as directed.  [name] spironolactone (ALDACTONE) 25 MG tablet TAKE 1/2 TABLET BY MOUTH TWICE A DAY  . sucralfate (CARAFATE) 1 g tablet Please specify directions, refills and quantity  . tiZANidine (ZANAFLEX) 4 MG tablet Take 4 mg by mouth every 6 (six) hours as needed for muscle spasms.   [name] topiramate (TOPAMAX) 25 MG tablet Take 25 mg by mouth 2 (two) times daily as needed (For migraines.).   [name] torsemide (DEMADEX) 20 MG tablet TAKE 2 TABLETS(40 MG) BY MOUTH TWICE DAILY (Patient taking differently: Take 40 mg by mouth 2 (two) times daily. )  . traMADol (ULTRAM) 50 MG tablet Take 1 tablet (50 mg total) by mouth every 6 (six) hours as needed. (Patient taking differently: Take 50 mg by mouth every 6 (six) hours as needed for moderate pain. )  . UNABLE TO FIND Med Name: CPAP with sleep  . dicyclomine (BENTYL) 10 MG capsule Take 1 capsule (10 mg total) by mouth 3 (three) times daily before meals for 5 days.  . potassium chloride SA (K-DUR) 20 MEQ tablet Take 2 tablets (40 mEq total) by mouth 2 (two) times daily for 3 days. In addition to your usual supplement (Patient not taking: Reported on 06/05/2018)   No facility-administered encounter medications on file as of  07/02/2018.     Allergies as of 07/02/2018 - Review Complete 07/02/2018  Allergen Reaction Noted  . Sulfonamide derivatives Anaphylaxis, Swelling, Rash, and Other (See Comments) 10/21/2006  . Doxycycline Nausea And Vomiting 01/05/2008  . Latex Hives 09/28/2011  . Ciprofloxacin Rash 10/21/2006  . Fish allergy Rash and Other (See Comments) 03/08/2012  . Hydrocortisone Rash   . Neomycin Rash 10/21/2006    Past Medical History:  Diagnosis Date  . Acute on chronic diastolic CHF (congestive heart failure) ([place]) 04/11/2014  . Acute suppurative otitis media without spontaneous rupture of eardrum 02/04/2007   Centricity Description: OTITIS MEDIA, SUPPURATIVE, ACUTE, BILATERAL Qualifier: Diagnosis of  By: [name] MD, [name]  Centricity Description: OTITIS MEDIA, SUPPURATIVE, ACUTE Qualifier: Diagnosis of  By: [name] MD, [name]   . Allergic rhinitis   . Allergy   . Anemia   . Anxiety   . Arthritis   . CAD (coronary artery disease)    LHC (11/29/1999):  EF 60%; no  significant CAD.  [name] Carpal tunnel syndrome   . Carpal tunnel syndrome 06/09/2006   Qualifier: Diagnosis of  By: Marca Ancona RMA, Lucy    . Chronic diastolic CHF (congestive heart failure) ([place])    a. Echo (01/21/11): Vigorous LVF, EF 65-70%, no RWMA, Gr 2 DD. b.  Echo (12/15): Moderate LVH, EF 60-73%, grade 2 diastolic dysfunction, mild LAE, normal RV function c. 01/2015: echo showing EF of 60-65% with mild LVH  . Common migraine   . COPD (chronic obstructive pulmonary disease) ([place])   . Diastolic heart failure ([place])   . Diverticulitis   . GERD (gastroesophageal reflux disease)   . Hepatic steatosis   . Hepatomegaly   . Hiatal hernia   . Hyperlipidemia   . Hypertension   . IBS (irritable bowel syndrome)   . Internal hemorrhoids   . Morbid obesity ([place])   . OSA (obstructive sleep apnea)    CPAP dependent  . PNEUMONIA, ORGAN UNSPECIFIED 03/01/2009   Qualifier: Diagnosis of  By: [name] CMA, [name]    . Sinusitis, chronic 03/10/2012    CT sinuses 02/2012:  Chronic sinusitis with small A/F levels >> rx by ENT with prolonged augmentin F/u ct sinuses 04/2012:  Persistent sinus thickening >> rx with levaquin and clinda for 30 days.  Told to return for sinus scan in 6 weeks >> no showed for followup visit.  CT sinuses 11/2013:  Acute and chronic sinusitis noted.    . Sleep apnea    bi pap    Past Surgical History:  Procedure Laterality Date  . ABDOMINAL HYSTERECTOMY    . BIOPSY  06/22/2018   Procedure: BIOPSY;  Surgeon: Mauri Pole, MD;  Location: [facility];  Service: Endoscopy;;  . CESAREAN SECTION    . COLONOSCOPY    . COLONOSCOPY WITH PROPOFOL N/A 05/05/2017   Procedure: COLONOSCOPY WITH PROPOFOL;  Surgeon: Mauri Pole, MD;  Location: [facility];  Service: Endoscopy;  Laterality: N/A;  . ESOPHAGOGASTRODUODENOSCOPY (EGD) WITH PROPOFOL N/A 06/22/2018   Procedure: ESOPHAGOGASTRODUODENOSCOPY (EGD) WITH PROPOFOL;  Surgeon: Mauri Pole, MD;  Location: [facility];  Service: Endoscopy;  Laterality: N/A;  . NASAL SINUS SURGERY    . PARTIAL HYSTERECTOMY    . TONSILLECTOMY    . TUBAL LIGATION    . UVULOPALATOPHARYNGOPLASTY      Family History  Problem Relation Age of Onset  . Heart disease Mother   . Heart attack Mother   . Hypertension Mother   . Coronary artery disease Other        1st degree relatvie <50  . Breast cancer Other        aunt- ? paternal or maternal  . Asthma Sister   . Colon polyps Sister   . Stroke Neg Hx   . Colon cancer Neg Hx   . Esophageal cancer Neg Hx   . Rectal cancer Neg Hx   . Stomach cancer Neg Hx     Social History   Socioeconomic History  . Marital status: Widowed    Spouse name: Not on file  . Number of children: 2  . Years of education: Not on file  . Highest education level: Not on file  Occupational History  . Occupation: Retired  Scientific laboratory technician  . Financial resource strain: Not on file  . Food insecurity    Worry: Not on file    Inability: Not on  file  . Transportation needs    Medical: Not on file    Non-medical: Not on  file  Tobacco Use  . Smoking status: Never Smoker  . Smokeless tobacco: Never Used  Substance and Sexual Activity  . Alcohol use: No  . Drug use: No  . Sexual activity: Not on file  Lifestyle  . Physical activity    Days per week: Not on file    Minutes per session: Not on file  . Stress: Not on file  Relationships  . Social Herbalist on phone: Not on file    Gets together: Not on file    Attends religious service: Not on file    Active member of club or organization: Not on file    Attends meetings of clubs or organizations: Not on file    Relationship status: Not on file  . Intimate partner violence    Fear of current or ex partner: Not on file    Emotionally abused: Not on file    Physically abused: Not on file    Forced sexual activity: Not on file  Other Topics Concern  . Not on file  Social History Narrative   Charity fundraiser, Oceanographer.   Widowed, lives with son.      Review of systems: Review of Systems as per HPI All other systems reviewed and are negative.   Physical Exam: Vitals were not taken and physical exam was not performed during this virtual visit.  Data Reviewed:  Reviewed labs, radiology imaging, old records and pertinent past GI work up   Assessment and Plan/Recommendations:  58 year old female with morbid obesity, hypertension, hyperlipidemia, obstructive sleep apnea and metabolic syndrome She has had elevated blood sugars in the past, no recent A1c.  Advised patient to follow-up with PMD and to get evaluated for diabetes EGD findings suggestive of gastroparesis, she is scheduled for 4-hour gastric emptying study next week Continue small frequent meals  Plan for repeat EGD to visualize gastric mucosa as it was limited due to food residue and phytobezoar  Continue MiraLAX daily as needed  Continue Dexilant for GERD  Follow-up visit in 2  months    K. Denzil Magnuson , MD   CC: Bartholome Bill, MD

## 2018-07-02 NOTE — H&P (View-Only) (Signed)
[name]    [id number]    1960-06-08  Primary Care Physician:Boyd, Dola Factor, MD  Referring Physician: Bartholome Bill, MD [address],  [place] [address]  This service was provided via audio and video telemedicine (Doximity) due to [place] 19 pandemic.  Patient location: Home Provider location: Office Used 2 patient identifiers to confirm the correct person. Explained the limitations in evaluation and management via telemedicine. Patient is aware of potential medical charges for this visit.  Patient consented to this virtual visit.  The persons participating in this telemedicine service were myself and the patient   Chief complaint: Left upper quadrant abdominal pain  HPI:  58 yr old female with morbid obesity, metabolic syndrome, obstructive sleep apnea, hypertension here for follow-up visit. Patient was laying down in bed during the entire video call.  She is trying to drink boost and eat small meals, her symptoms are slightly better no longer vomiting but continues to have intermittent left upper quadrant abdominal discomfort, nausea and also has decreased appetite Is having regular bowel movement.  Denies any melena or blood per rectum  EGD June 22, 2018 with moderate amount of residue food in the body and fundus of stomach with limited visualization of the lining, gastritis, biopsies negative for H. pylori   Chronic GERD was on Protonix, switch to [facility] April 2020 and started on Pepcid at bedtime  Dicyclomine for IBS  CT abdomen pelvis March 30, 2018: No acute intra-abdominal or pelvic abnormality, moderate fat-containing umbilical hernia  Colonoscopy April 2019 with removal of 11 mm hamartomatous polyp in sigmoid colon  Colonoscopy November 04, 2003 normal with no polyps  Outpatient Encounter Medications as of 07/02/2018  Medication Sig  . albuterol (PROVENTIL) (2.5 MG/3ML) 0.083% nebulizer solution Take 3  mLs (2.5 mg total) by nebulization every 4 (four) hours as needed for wheezing or shortness of breath.  [name] aspirin 81 MG EC tablet TAKE 1 TABLET BY MOUTH EVERY DAY (Patient taking differently: Take 81 mg by mouth daily. )  . benzonatate (TESSALON) 200 MG capsule Take 1 capsule (200 mg total) by mouth 3 (three) times daily as needed for cough.  . carvedilol (COREG) 12.5 MG tablet Take 1 tablet (12.5 mg total) by mouth 2 (two) times daily with a meal.  . chlorpheniramine (CHLOR-TRIMETON) 4 MG tablet Take 8 mg by mouth at bedtime. May add 1 extra every 4 hours as needed for drainage, drippy nose, throat clearing  . dexlansoprazole (DEXILANT) 60 MG capsule Take 1 capsule (60 mg total) by mouth every morning. Take 30 minutes before breakast  . Dextromethorphan-Guaifenesin (MUCINEX DM MAXIMUM STRENGTH) 60-1200 MG TB12 Take 1 tablet by mouth at bedtime as needed (cough).   . doxycycline (VIBRA-TABS) 100 MG tablet Take 1 tablet (100 mg total) by mouth 2 (two) times daily.  . fluticasone (FLONASE) 50 MCG/ACT nasal spray Place 2 sprays into both nostrils daily. (Patient taking differently: Place 2 sprays into both nostrils daily as needed for allergies. )  . hydrALAZINE (APRESOLINE) 25 MG tablet TAKE 1 TABLET BY MOUTH THREE TIMES A DAY (Patient taking differently: Take 25 mg by mouth 3 (three) times daily. )  . isosorbide dinitrate (ISORDIL) 20 MG tablet Take 1 tablet (20 mg total) by mouth 3 (three) times daily.  [name] loperamide (IMODIUM) 2 MG capsule Take 1 capsule (2 mg total) by mouth 4 (four) times daily as needed for diarrhea or loose  stools.  [name] losartan (COZAAR) 25 MG tablet Take 50 mg by mouth daily.  [name] losartan (COZAAR) 50 MG tablet TAKE 1 TABLET BY MOUTH EVERY DAY  . metolazone (ZAROXOLYN) 2.5 MG tablet Take 2.5 mg by mouth daily as needed (FOR OVERNIGHT WEIGHT GAIN OF 3 lbs. or more).  . montelukast (SINGULAIR) 10 MG tablet TAKE 1 TABLET(10 MG) BY MOUTH AT BEDTIME (Patient taking differently: Take 10 mg  by mouth at bedtime. )  . ondansetron (ZOFRAN) 4 MG tablet Take 1 tablet (4 mg total) by mouth every 8 (eight) hours as needed for nausea or vomiting.  . polyethylene glycol powder (GLYCOLAX/MIRALAX) powder Use 2 capfuls in the morning and 1 capful in the evening and as directed (Patient taking differently: Take 17-34 g by mouth daily as needed for moderate constipation. )  . potassium chloride SA (K-DUR,KLOR-CON) 20 MEQ tablet TAKE 3 TABLETS (60 MEQ) BY MOUTH THREE TIMES DAILY (Patient taking differently: Take 180 mEq by mouth daily. )  . promethazine (PHENERGAN) 25 MG tablet Take 1 tablet (25 mg total) by mouth every 6 (six) hours as needed for nausea or vomiting.  [name] Respiratory Therapy Supplies (FLUTTER) DEVI 1 application by Does not apply route as directed.  [name] spironolactone (ALDACTONE) 25 MG tablet TAKE 1/2 TABLET BY MOUTH TWICE A DAY  . sucralfate (CARAFATE) 1 g tablet Please specify directions, refills and quantity  . tiZANidine (ZANAFLEX) 4 MG tablet Take 4 mg by mouth every 6 (six) hours as needed for muscle spasms.   [name] topiramate (TOPAMAX) 25 MG tablet Take 25 mg by mouth 2 (two) times daily as needed (For migraines.).   [name] torsemide (DEMADEX) 20 MG tablet TAKE 2 TABLETS(40 MG) BY MOUTH TWICE DAILY (Patient taking differently: Take 40 mg by mouth 2 (two) times daily. )  . traMADol (ULTRAM) 50 MG tablet Take 1 tablet (50 mg total) by mouth every 6 (six) hours as needed. (Patient taking differently: Take 50 mg by mouth every 6 (six) hours as needed for moderate pain. )  . UNABLE TO FIND Med Name: CPAP with sleep  . dicyclomine (BENTYL) 10 MG capsule Take 1 capsule (10 mg total) by mouth 3 (three) times daily before meals for 5 days.  . potassium chloride SA (K-DUR) 20 MEQ tablet Take 2 tablets (40 mEq total) by mouth 2 (two) times daily for 3 days. In addition to your usual supplement (Patient not taking: Reported on 06/05/2018)   No facility-administered encounter medications on file as of  07/02/2018.     Allergies as of 07/02/2018 - Review Complete 07/02/2018  Allergen Reaction Noted  . Sulfonamide derivatives Anaphylaxis, Swelling, Rash, and Other (See Comments) 10/21/2006  . Doxycycline Nausea And Vomiting 01/05/2008  . Latex Hives 09/28/2011  . Ciprofloxacin Rash 10/21/2006  . Fish allergy Rash and Other (See Comments) 03/08/2012  . Hydrocortisone Rash   . Neomycin Rash 10/21/2006    Past Medical History:  Diagnosis Date  . Acute on chronic diastolic CHF (congestive heart failure) ([place]) 04/11/2014  . Acute suppurative otitis media without spontaneous rupture of eardrum 02/04/2007   Centricity Description: OTITIS MEDIA, SUPPURATIVE, ACUTE, BILATERAL Qualifier: Diagnosis of  By: [name] MD, [name]  Centricity Description: OTITIS MEDIA, SUPPURATIVE, ACUTE Qualifier: Diagnosis of  By: [name] MD, [name]   . Allergic rhinitis   . Allergy   . Anemia   . Anxiety   . Arthritis   . CAD (coronary artery disease)    LHC (11/29/1999):  EF 60%; no  significant CAD.  [name] Carpal tunnel syndrome   . Carpal tunnel syndrome 06/09/2006   Qualifier: Diagnosis of  By: Marca Ancona RMA, Lucy    . Chronic diastolic CHF (congestive heart failure) ([place])    a. Echo (01/21/11): Vigorous LVF, EF 65-70%, no RWMA, Gr 2 DD. b.  Echo (12/15): Moderate LVH, EF 43-32%, grade 2 diastolic dysfunction, mild LAE, normal RV function c. 01/2015: echo showing EF of 60-65% with mild LVH  . Common migraine   . COPD (chronic obstructive pulmonary disease) ([place])   . Diastolic heart failure ([place])   . Diverticulitis   . GERD (gastroesophageal reflux disease)   . Hepatic steatosis   . Hepatomegaly   . Hiatal hernia   . Hyperlipidemia   . Hypertension   . IBS (irritable bowel syndrome)   . Internal hemorrhoids   . Morbid obesity ([place])   . OSA (obstructive sleep apnea)    CPAP dependent  . PNEUMONIA, ORGAN UNSPECIFIED 03/01/2009   Qualifier: Diagnosis of  By: [name] CMA, [name]    . Sinusitis, chronic 03/10/2012    CT sinuses 02/2012:  Chronic sinusitis with small A/F levels >> rx by ENT with prolonged augmentin F/u ct sinuses 04/2012:  Persistent sinus thickening >> rx with levaquin and clinda for 30 days.  Told to return for sinus scan in 6 weeks >> no showed for followup visit.  CT sinuses 11/2013:  Acute and chronic sinusitis noted.    . Sleep apnea    bi pap    Past Surgical History:  Procedure Laterality Date  . ABDOMINAL HYSTERECTOMY    . BIOPSY  06/22/2018   Procedure: BIOPSY;  Surgeon: Mauri Pole, MD;  Location: [facility];  Service: Endoscopy;;  . CESAREAN SECTION    . COLONOSCOPY    . COLONOSCOPY WITH PROPOFOL N/A 05/05/2017   Procedure: COLONOSCOPY WITH PROPOFOL;  Surgeon: Mauri Pole, MD;  Location: [facility];  Service: Endoscopy;  Laterality: N/A;  . ESOPHAGOGASTRODUODENOSCOPY (EGD) WITH PROPOFOL N/A 06/22/2018   Procedure: ESOPHAGOGASTRODUODENOSCOPY (EGD) WITH PROPOFOL;  Surgeon: Mauri Pole, MD;  Location: [facility];  Service: Endoscopy;  Laterality: N/A;  . NASAL SINUS SURGERY    . PARTIAL HYSTERECTOMY    . TONSILLECTOMY    . TUBAL LIGATION    . UVULOPALATOPHARYNGOPLASTY      Family History  Problem Relation Age of Onset  . Heart disease Mother   . Heart attack Mother   . Hypertension Mother   . Coronary artery disease Other        1st degree relatvie <50  . Breast cancer Other        aunt- ? paternal or maternal  . Asthma Sister   . Colon polyps Sister   . Stroke Neg Hx   . Colon cancer Neg Hx   . Esophageal cancer Neg Hx   . Rectal cancer Neg Hx   . Stomach cancer Neg Hx     Social History   Socioeconomic History  . Marital status: Widowed    Spouse name: Not on file  . Number of children: 2  . Years of education: Not on file  . Highest education level: Not on file  Occupational History  . Occupation: Retired  Scientific laboratory technician  . Financial resource strain: Not on file  . Food insecurity    Worry: Not on file    Inability: Not on  file  . Transportation needs    Medical: Not on file    Non-medical: Not on  file  Tobacco Use  . Smoking status: Never Smoker  . Smokeless tobacco: Never Used  Substance and Sexual Activity  . Alcohol use: No  . Drug use: No  . Sexual activity: Not on file  Lifestyle  . Physical activity    Days per week: Not on file    Minutes per session: Not on file  . Stress: Not on file  Relationships  . Social Herbalist on phone: Not on file    Gets together: Not on file    Attends religious service: Not on file    Active member of club or organization: Not on file    Attends meetings of clubs or organizations: Not on file    Relationship status: Not on file  . Intimate partner violence    Fear of current or ex partner: Not on file    Emotionally abused: Not on file    Physically abused: Not on file    Forced sexual activity: Not on file  Other Topics Concern  . Not on file  Social History Narrative   Charity fundraiser, Oceanographer.   Widowed, lives with son.      Review of systems: Review of Systems as per HPI All other systems reviewed and are negative.   Physical Exam: Vitals were not taken and physical exam was not performed during this virtual visit.  Data Reviewed:  Reviewed labs, radiology imaging, old records and pertinent past GI work up   Assessment and Plan/Recommendations:  58 year old female with morbid obesity, hypertension, hyperlipidemia, obstructive sleep apnea and metabolic syndrome She has had elevated blood sugars in the past, no recent A1c.  Advised patient to follow-up with PMD and to get evaluated for diabetes EGD findings suggestive of gastroparesis, she is scheduled for 4-hour gastric emptying study next week Continue small frequent meals  Plan for repeat EGD to visualize gastric mucosa as it was limited due to food residue and phytobezoar  Continue MiraLAX daily as needed  Continue Dexilant for GERD  Follow-up visit in 2  months    K. Denzil Magnuson , MD   CC: Bartholome Bill, MD

## 2018-07-03 ENCOUNTER — Encounter: Payer: Self-pay | Admitting: Gastroenterology

## 2018-07-08 ENCOUNTER — Encounter (HOSPITAL_COMMUNITY)
Admission: RE | Admit: 2018-07-08 | Discharge: 2018-07-08 | Disposition: A | Discharge status: Home / Self Care | Payer: 59 | Source: Ambulatory Visit | Attending: Gastroenterology | Admitting: Gastroenterology

## 2018-07-08 ENCOUNTER — Other Ambulatory Visit: Payer: Self-pay

## 2018-07-08 DIAGNOSIS — R1013 Epigastric pain: Secondary | ICD-10-CM | POA: Diagnosis not present

## 2018-07-08 MED ORDER — TECHNETIUM TC 99M SULFUR COLLOID
2.0000 | Freq: Once | INTRAVENOUS | Status: AC | PRN
  Administered 2018-07-08: 2 via INTRAVENOUS

## 2018-07-09 ENCOUNTER — Encounter: Payer: 59 | Admitting: Registered"

## 2018-07-09 DIAGNOSIS — K3184 Gastroparesis: Secondary | ICD-10-CM

## 2018-07-09 DIAGNOSIS — E8881 Metabolic syndrome: Secondary | ICD-10-CM | POA: Diagnosis not present

## 2018-07-09 NOTE — Progress Notes (Signed)
Medical Nutrition Therapy:  Appt start time: 1200 end time:  1245  I connected with  [name] on 07/09/18 by telephone and verified that I was speaking with the correct person using two identifiers (Full name and DOB).   I discussed the limitations of evaluation and management by phone visit. The patient expressed understanding and agreed to proceed.  Assessment:  Primary concerns today: Pt referred due to gastroparesis and metabolic syndrome. Pt reports she is still having problems with stomach pain. On 06/17 pt had a gastric emptying assessment which showed normal gastric emptying per note. Pt reports she is scheduled to have an endoscopy next month to further assess GI issue. Reports she had blood work done this week but has not yet received her results.   Pt reports she has still been struggling with getting in much solid food. Reports she has been able to get in 2-3 Boost Plus (360 kcal, 14 g pro each) per day. Reports she does 2 Boosts if able to eat something solid and 2-3 Boosts on days she does not feel like eating anything solid. Reports she has still only been eating solids once a day. Reports the day before yesterday she ate 2-4 spoons of pasta, grilled chicken, and broccoli dish from [facility] Tuesday and her stomach hurt afterwards. Reports her stomach does not hurt if she eats something like grilled chicken and mashed potatoes. Reports she has been trying to limit fluid. Reports including about 70 oz fluid per day including fluid in Boost drinks. Pt reports that her doctor does not want her to take any vitamins at this time per pt.   Food Allergies/Intolerances: mackerel.  GI Concerns: stomach pain, constipation (takes MiraLAX)  Pertinent Lab Values: N/A  Preferred Learning Style:   No preference indicated   Learning Readiness:  Ready  MEDICATIONS: See list. Reviewed.    DIETARY INTAKE:  Usual eating pattern includes 1 meal of solid food and 0 snacks per day. 2-3 Boost  Plus per day.   Common foods: grilled chicken.  Avoided foods: fried foods; eggs; milk (does not like taste of milk).    Typical Snacks: none reported.     Typical Beverages: Gatorade, water, Boost Plus. Currently getting in around 72 oz fluid per day. Reports a fluid restriction of 3L.  Location of Meals: living room but varies.   Electronics Present at [employer]: Yes: TV.  24-hr recall:  B ( AM): None reported.  Snk ( AM): None reported.  L ( PM): None reported.  Snk ( PM): None reported.  D (6 PM): 2-4 spoons of pasta, grilled chicken, and broccoli from [facility] Tuesday  Snk ( PM): None reported. Beverages: 2 Boost Plus, 3 bottles water, 6 oz Gatorade   Usual physical activity: None reported.  Minutes/Week: None reported.   Progress Towards Goal(s):  Some progress.   Nutritional Diagnosis:  NB-1.1 Food and nutrition-related knowledge deficit As related to gastroparesis diet.  As evidenced by no previous nutrition education with a dietitian . NI-5.11.1 Predicted suboptimal nutrient intake As related to recent dx of gastroparesis .  As evidenced by pt's reported dietary recall and habits .    Intervention:  Nutrition counseling provided. Dietitian praised pt for working to include [employer]. Reviewed recommended and not recommended foods for gastroparesis with pt as pt reports eating a meal from [facility] Tuesday containing broccoli. Discussed trying to eat a small amount of solid food at least 3 times throughout the day and recommended starting with a  small portion of a recommended lean protein and starch such as baked or grilled chicken and mashed potatoes. Discussed using the list provided to pt to choose appropriate foods. Recommend continuing with 2 Boost Plus when able to eat some solid foods and ~3-4 on days pt is unable to eat any solids. Recommended pt start keeping a food and symptom log to help identify foods that may worsen symptoms. Did not discuss adding a multivitamin at this  time as pt reports her doctor does not want to add anything else until further assessment of GI problem per pt. Discussed that continuing to include Boost provides pt with a good amount of nutrients to supplement very limited diet. Pt appeared agreeable to information/goals discussed.   Instructions/Goals:   -Include a small portion of solid or semi-solid food at least 3 times per day. -Start with a small amount of recommended protein and starch (see recommended list). -Include low fiber, low fat, easily digestible foods (see handout recommended/not recommended lists)  -Avoid foods that are high in fiber and high in fat. Avoid food on not recommended list (see list) -Avoid drinking right (at least 30 minutes) before meals.  Recommend continuing with 2 Boost Plus spread throughout the day.  -Want to also include solid/semi-solid foods as well -If unable to include any solid food, can do 3-4 that day.   Write down foods and beverages you eat, time, and how your stomach feels. Please have your journal with you at next appointment time.  Contact dietitian with any questions/concerns via phone, My Chart, or email.   Teaching Method Utilized: Visual Auditory  Handouts given during visit include:  MNT for Gastroparesis   Barriers to learning/adherence to lifestyle change: None reported.  Demonstrated degree of understanding via:  Teach Back   Monitoring/Evaluation:  Dietary intake, exercise, and body weight in 2 week(s).

## 2018-07-10 ENCOUNTER — Encounter: Payer: Self-pay | Admitting: Registered"

## 2018-07-10 ENCOUNTER — Telehealth: Payer: Self-pay | Admitting: Registered"

## 2018-07-10 NOTE — Patient Instructions (Signed)
Instructions/Goals:   -Include a small portion of solid or semi-solid food at least 3 times per day. -Start with a small amount of recommended protein and starch (see recommended list). -Include low fiber, low fat, easily digestible foods (see handout recommended/not recommended lists)  -Avoid foods that are high in fiber and high in fat. Avoid food on not recommended list (see list) -Avoid drinking right (at least 30 minutes) before meals.  Recommend continuing with 2 Boost Plus spread throughout the day.  -Want to also include solid/semi-solid foods as well -If unable to include any solid food, can do 3-4 that day.   Write down foods and beverages you eat, time, and how your stomach feels. Please have your journal with you at next appointment time.  Contact dietitian with any questions/concerns via phone, My Chart, or email.

## 2018-07-10 NOTE — Telephone Encounter (Signed)
Mailed 07/09/18 phone visit summary and additional copy of Gastroparesis MNT handout.

## 2018-07-11 ENCOUNTER — Other Ambulatory Visit: Payer: Self-pay | Admitting: Cardiovascular Disease

## 2018-07-23 ENCOUNTER — Ambulatory Visit: Payer: 59 | Admitting: Registered"

## 2018-07-23 ENCOUNTER — Encounter: Payer: Medicare Other | Attending: Gastroenterology | Admitting: Registered"

## 2018-07-23 ENCOUNTER — Telehealth: Payer: Self-pay | Admitting: Registered"

## 2018-07-23 ENCOUNTER — Encounter: Payer: Self-pay | Admitting: Registered"

## 2018-07-23 ENCOUNTER — Other Ambulatory Visit (HOSPITAL_COMMUNITY)
Admission: RE | Admit: 2018-07-23 | Discharge: 2018-07-23 | Disposition: A | Discharge status: Home / Self Care | Payer: 59 | Source: Ambulatory Visit | Attending: Gastroenterology | Admitting: Gastroenterology

## 2018-07-23 DIAGNOSIS — Z01812 Encounter for preprocedural laboratory examination: Secondary | ICD-10-CM | POA: Insufficient documentation

## 2018-07-23 DIAGNOSIS — Z1159 Encounter for screening for other viral diseases: Secondary | ICD-10-CM | POA: Insufficient documentation

## 2018-07-23 DIAGNOSIS — K3184 Gastroparesis: Secondary | ICD-10-CM | POA: Insufficient documentation

## 2018-07-23 DIAGNOSIS — E8881 Metabolic syndrome: Secondary | ICD-10-CM | POA: Insufficient documentation

## 2018-07-23 LAB — SARS CORONAVIRUS 2 (TAT 6-24 HRS): SARS Coronavirus 2: NEGATIVE

## 2018-07-23 NOTE — Patient Instructions (Addendum)
Instructions/Goals:   Solid/Semi-Solid Foods: -Try to include a small portion of solid or semi-solid food at least 3 times per day. If unable to have solids at a mealtime, drink a Boost. -Start with a small amount of recommended lean protein and low fiber starch (see recommended list). -Include low fiber, low fat, easily digestible foods (see handout recommended/not recommended lists)  -Avoid foods that are high in fiber and high in fat. Avoid food on not recommended list (see list) -Avoid drinking right (at least 30 minutes) before meals as we do not want to fill up on fluid and not be able to get in good nutritious foods.  -Avoid eating 3 hours or less before laying down  Recommend continuing with 2 Boost Plus spread throughout the day.  -Want to also include solid/semi-solid foods as well -If unable to include solids at a mealtime, drink a Boost Plus. Can break it up and drink part then and rest later, etc if needed. Will want to refrigerate in between.  -If unable to include any solid food, can do 3-4 Boosts that day.   Recommend writing down foods or using an app to track foods you eat and also noting how your stomach feels afterward. This can help identify triggering foods. Please have these notes at next appointment to discuss.  Contact dietitian with any questions/concerns via phone, My Chart, or email.

## 2018-07-23 NOTE — Telephone Encounter (Signed)
Mailed patient summary from appointment 07/23/2018.

## 2018-07-23 NOTE — Progress Notes (Signed)
Medical Nutrition Therapy:  Appt start time: 1230 end time:  1302  I connected with  [name] on 07/23/18 by telephone and verified that I was speaking with the correct person using two identifiers (Full name and DOB).   I discussed the limitations of evaluation and management by phone visit. The patient expressed understanding and agreed to proceed.  Assessment:  Primary concerns today: Pt referred due to gastroparesis and metabolic syndrome. Pt reports she has been including 2 Boost Plus most days and eating some solids 1-2 times most days. Often will have solid foods for dinner. Reports her daughter prepared tacos with ground [place] and also had guacamole a few days ago and it made her stomach feel really bad. Reports last night she had a small portion of salmon and yellow rice and it set well on her stomach. Per pt she reports that chicken chalupa from [employer] sit well on her stomach. Pt reports that "light" foods sit best on her stomach. Reports she told her daughter she did not need to eat the guacamole and showed her the dietary information and recommended/not recommended food lists. Pt reports she continues to avoid eating after 7 PM at night.   Pt is scheduled to have endoscopy next week. Pt reports she had lab work done last month to test for diabetes and reports she does not have diabetes. Reports she requested information be sent to [place] office.   Food Allergies/Intolerances: mackerel.  GI Concerns: stomach pain after most heavy meals. Constipation is being managed with Miralax. Pt denies diarrhea or vomiting.   Pertinent Lab Values: N/A  Preferred Learning Style:   No preference indicated   Learning Readiness:  Ready  MEDICATIONS: See list. Reviewed.    DIETARY INTAKE:  Usual eating pattern includes 1 meal of solid food and 0 snacks per day. 2 Boost Plus per day.   Common foods: grilled chicken.  Avoided foods: fried foods;  eggs; milk (does not like taste of milk).    Typical Snacks: none reported.     Typical Beverages: Gatorade (~6-12 oz), water (48-64 oz), Boost Plus (2 per/16 oz per day). Reports a fluid restriction of 3L per pt.   Location of Meals: living room but varies.   Electronics Present at [employer]: Yes: TV.  24-hr recall:  B ( AM): None reported.  Snk ( AM): None reported.  L ( PM): None reported.  Snk ( PM): None reported.  D (6 PM): 2-3 bites salmon, 4 bites yellow rice Snk ( PM): None reported. Beverages: water; ~12 oz Gatorade  Usual physical activity: None reported.  Minutes/Week: None reported.   Progress Towards Goal(s):  Some progress.   Nutritional Diagnosis:  NB-1.1 Food and nutrition-related knowledge deficit As related to gastroparesis diet.  As evidenced by no previous nutrition education with a dietitian . NI-5.11.1 Predicted suboptimal nutrient intake As related to recent dx of gastroparesis .  As evidenced by pt's reported dietary recall and habits .    Intervention:  Nutrition counseling provided. Dietitian reiterated working to eat small amounts spread throughout the day. Discussed that eating several times each day rather than 1 time per day is easier on stomach and can allow for pt to receive more nutrition. Recommended trying to eat 3 meals/day as a start/minimum. Recommended including a Boost Plus if unable to eat regular food at one of those time. Want to continue 2-3 Boost per day while until to eat adequate amount  of solids. Discussed avoiding high fat/high fiber foods and spicy foods and choosing lean proteins and low fiber starches for meals. Discussed that guacamole is high fat and includes fiber as well which is likely why pt felt so bad after eating it earlier this week. Pt appeared agreeable to information/goals discussed. Feel results from endoscopy with GI next week will be very helpful to see what may be causing pt's GI symptoms as pt reports some high  fiber/high fat foods being tolerated (fast food chicken chalupa and [employer]) while others do not do well. Journaling of foods/GI symptoms could also be very helpful to point out patterns and possible triggering foods.   Instructions/Goals:   -Include a small portion of solid or semi-solid food at least 3 times per day.  -Start with a small amount of recommended protein and starch (see recommended list). -Include low fiber, low fat, easily digestible foods (see handout recommended/not recommended lists)  -Avoid foods that are high in fiber and high in fat. Avoid food on not recommended list (see list) -Avoid drinking right (at least 30 minutes) before meals as we do not want to fill up on fluid and not be able to get in good nutritious foods.   Recommend continuing with 2 Boost Plus spread throughout the day.  -Want to also include solid/semi-solid foods as well -If unable to include solids at a mealtime, drink a Boost Plus.  -If unable to include any solid food, can do 3-4 that day.   Recommend writing down foods or using an app to track foods you eat and also noting how your stomach feels afterward. This can help identify triggering foods.  Contact dietitian with any questions/concerns via phone, My Chart, or email.   Teaching Method Utilized: Visual Auditory  Barriers to learning/adherence to lifestyle change: None reported.  Demonstrated degree of understanding via:  Teach Back   Monitoring/Evaluation:  Dietary intake, exercise, and body weight in 3 week(s).

## 2018-07-27 ENCOUNTER — Other Ambulatory Visit: Payer: Self-pay

## 2018-07-27 ENCOUNTER — Encounter (HOSPITAL_COMMUNITY): Payer: Self-pay

## 2018-07-27 NOTE — Progress Notes (Signed)
SPOKE W/  [name]     SCREENING SYMPTOMS OF COVID 19:   COUGH--NO  RUNNY NOSE--- NO  SORE THROAT---NO  NASAL CONGESTION----NO  SNEEZING----NO  SHORTNESS OF BREATH---NO  DIFFICULTY BREATHING---NO  TEMP >100.0 -----NO  UNEXPLAINED BODY ACHES------NO  CHILLS -------- NO  HEADACHES ---------NO  LOSS OF SMELL/ TASTE --------NO    HAVE YOU OR ANY FAMILY MEMBER TRAVELLED PAST 14 DAYS OUT OF THE   COUNTY---NO STATE----NO COUNTRY----NO  HAVE YOU OR ANY FAMILY MEMBER BEEN EXPOSED TO ANYONE WITH COVID 19? NO

## 2018-07-27 NOTE — Anesthesia Preprocedure Evaluation (Addendum)
Anesthesia Evaluation  Patient identified by MRN, date of birth, ID band Patient awake    Reviewed: Allergy & Precautions, NPO status , Patient's Chart, lab work & pertinent test results, reviewed documented beta blocker date and time   Airway Mallampati: II  TM Distance: >3 FB Neck ROM: Full    Dental no notable dental hx. (+) Teeth Intact, Dental Advisory Given   Pulmonary sleep apnea and Continuous Positive Airway Pressure Ventilation , COPD,  COPD inhaler,    Pulmonary exam normal breath sounds clear to auscultation       Cardiovascular hypertension, Pt. on home beta blockers and Pt. on medications + CAD and +CHF  Normal cardiovascular exam Rhythm:Regular Rate:Normal  TTE 2019 - Left ventricle: The cavity size was normal. There was moderate   concentric hypertrophy. Systolic function was vigorous. The   estimated ejection fraction was in the range of 65% to 70%. Wall   motion was normal; there were no regional wall motion   abnormalities. Doppler parameters are consistent with abnormal   left ventricular relaxation (grade 1 diastolic dysfunction).   Doppler parameters are consistent with elevated ventricular   end-diastolic filling pressure. - Aortic valve: There was no regurgitation. - Mitral valve: There was no regurgitation. - Left atrium: The atrium was normal in size. - Right ventricle: Systolic function was normal. - Right atrium: The atrium was normal in size. - Tricuspid valve: There was no regurgitation. - Pulmonary arteries: Systolic pressure could not be accurately   estimated. - Inferior vena cava: The vessel was normal in size. - Pericardium, extracardiac: There was no pericardial effusion.  Stress Test 2019 Nuclear stress EF: 77%. No wall motion abnormality. Increased LV wall thickness. The left ventricular ejection fraction is hyperdynamic (>65%). There was no ST segment deviation noted during  stress. This is a low risk study. No ischemia identified.   Neuro/Psych  Headaches, PSYCHIATRIC DISORDERS Anxiety    GI/Hepatic Neg liver ROS, hiatal hernia, GERD  ,  Endo/Other  Morbid obesity  Renal/GU negative Renal ROS  negative genitourinary   Musculoskeletal negative musculoskeletal ROS (+)   Abdominal   Peds  Hematology negative hematology ROS (+)   Anesthesia Other Findings EGD for abdominal pain  Reproductive/Obstetrics                            Anesthesia Physical Anesthesia Plan  ASA: III  Anesthesia Plan: MAC   Post-op Pain Management:    Induction: Intravenous  PONV Risk Score and Plan: 2 and Propofol infusion and Treatment may vary due to age or medical condition  Airway Management Planned: Natural Airway and Simple Face Mask  Additional Equipment:   Intra-op Plan:   Post-operative Plan:   Informed Consent: I have reviewed the patients History and Physical, chart, labs and discussed the procedure including the risks, benefits and alternatives for the proposed anesthesia with the patient or authorized representative who has indicated his/her understanding and acceptance.     Dental advisory given  Plan Discussed with: CRNA  Anesthesia Plan Comments:         Anesthesia Quick Evaluation

## 2018-07-28 ENCOUNTER — Encounter (HOSPITAL_COMMUNITY): Payer: Self-pay | Admitting: *Deleted

## 2018-07-28 ENCOUNTER — Ambulatory Visit (HOSPITAL_COMMUNITY): Payer: 59 | Admitting: Anesthesiology

## 2018-07-28 ENCOUNTER — Ambulatory Visit (HOSPITAL_COMMUNITY)
Admission: RE | Admit: 2018-07-28 | Discharge: 2018-07-28 | Disposition: A | Discharge status: Home / Self Care | Payer: 59 | Attending: Gastroenterology | Admitting: Gastroenterology

## 2018-07-28 ENCOUNTER — Encounter (HOSPITAL_COMMUNITY)
Admission: RE | Disposition: A | Discharge status: Home / Self Care | Payer: Self-pay | Source: Home / Self Care | Attending: Gastroenterology

## 2018-07-28 DIAGNOSIS — K259 Gastric ulcer, unspecified as acute or chronic, without hemorrhage or perforation: Secondary | ICD-10-CM

## 2018-07-28 DIAGNOSIS — K219 Gastro-esophageal reflux disease without esophagitis: Secondary | ICD-10-CM | POA: Diagnosis not present

## 2018-07-28 DIAGNOSIS — J449 Chronic obstructive pulmonary disease, unspecified: Secondary | ICD-10-CM | POA: Diagnosis not present

## 2018-07-28 DIAGNOSIS — I110 Hypertensive heart disease with heart failure: Secondary | ICD-10-CM | POA: Diagnosis not present

## 2018-07-28 DIAGNOSIS — G8929 Other chronic pain: Secondary | ICD-10-CM

## 2018-07-28 DIAGNOSIS — Z7982 Long term (current) use of aspirin: Secondary | ICD-10-CM | POA: Diagnosis not present

## 2018-07-28 DIAGNOSIS — R1012 Left upper quadrant pain: Secondary | ICD-10-CM | POA: Diagnosis present

## 2018-07-28 DIAGNOSIS — K589 Irritable bowel syndrome without diarrhea: Secondary | ICD-10-CM | POA: Insufficient documentation

## 2018-07-28 DIAGNOSIS — K2950 Unspecified chronic gastritis without bleeding: Secondary | ICD-10-CM | POA: Diagnosis not present

## 2018-07-28 DIAGNOSIS — G4733 Obstructive sleep apnea (adult) (pediatric): Secondary | ICD-10-CM | POA: Insufficient documentation

## 2018-07-28 DIAGNOSIS — R1013 Epigastric pain: Secondary | ICD-10-CM

## 2018-07-28 DIAGNOSIS — I5032 Chronic diastolic (congestive) heart failure: Secondary | ICD-10-CM | POA: Insufficient documentation

## 2018-07-28 DIAGNOSIS — Z6841 Body Mass Index (BMI) 40.0 and over, adult: Secondary | ICD-10-CM | POA: Diagnosis not present

## 2018-07-28 DIAGNOSIS — I2510 Atherosclerotic heart disease of native coronary artery without angina pectoris: Secondary | ICD-10-CM | POA: Insufficient documentation

## 2018-07-28 DIAGNOSIS — K317 Polyp of stomach and duodenum: Secondary | ICD-10-CM

## 2018-07-28 DIAGNOSIS — E785 Hyperlipidemia, unspecified: Secondary | ICD-10-CM | POA: Diagnosis not present

## 2018-07-28 DIAGNOSIS — Z79899 Other long term (current) drug therapy: Secondary | ICD-10-CM | POA: Diagnosis not present

## 2018-07-28 DIAGNOSIS — R109 Unspecified abdominal pain: Secondary | ICD-10-CM

## 2018-07-28 HISTORY — DX: Metabolic syndrome: E88.81

## 2018-07-28 HISTORY — PX: BIOPSY: SHX5522

## 2018-07-28 HISTORY — PX: ESOPHAGOGASTRODUODENOSCOPY (EGD) WITH PROPOFOL: SHX5813

## 2018-07-28 HISTORY — DX: Metabolic syndrome: E88.810

## 2018-07-28 HISTORY — DX: Personal history of other diseases of the circulatory system: Z86.79

## 2018-07-28 SURGERY — ESOPHAGOGASTRODUODENOSCOPY (EGD) WITH PROPOFOL
Anesthesia: Monitor Anesthesia Care

## 2018-07-28 MED ORDER — SODIUM CHLORIDE 0.9 % IV SOLN: INTRAVENOUS | Status: DC

## 2018-07-28 MED ORDER — PROPOFOL 10 MG/ML IV BOLUS
INTRAVENOUS | Status: DC | PRN
  Administered 2018-07-28 (×4): 20 mg via INTRAVENOUS

## 2018-07-28 MED ORDER — PROPOFOL 10 MG/ML IV BOLUS
INTRAVENOUS | Status: AC
  Filled 2018-07-28: qty 40

## 2018-07-28 MED ORDER — LACTATED RINGERS IV SOLN
INTRAVENOUS | Status: DC
  Administered 2018-07-28: 08:00:00 1000 mL via INTRAVENOUS

## 2018-07-28 MED ORDER — ONDANSETRON HCL 4 MG/2ML IJ SOLN: 4.0000 mg | Freq: Once | INTRAMUSCULAR | Status: DC | PRN

## 2018-07-28 MED ORDER — FENTANYL CITRATE (PF) 100 MCG/2ML IJ SOLN: 25.0000 ug | INTRAMUSCULAR | Status: DC | PRN

## 2018-07-28 MED ORDER — PROPOFOL 500 MG/50ML IV EMUL
INTRAVENOUS | Status: DC | PRN
  Administered 2018-07-28: 100 ug/kg/min via INTRAVENOUS

## 2018-07-28 MED ORDER — LIDOCAINE 2% (20 MG/ML) 5 ML SYRINGE
INTRAMUSCULAR | Status: DC | PRN
  Administered 2018-07-28: 100 mg via INTRAVENOUS

## 2018-07-28 SURGICAL SUPPLY — 15 items

## 2018-07-28 NOTE — Op Note (Signed)
[facility] Patient Name: [name] Procedure Date: 07/28/2018 MRN: [id number] Attending MD: Mauri Pole , MD Date of Birth: Oct 31, 1960 CSN: [id number] Age: 58 Admit Type: Outpatient Procedure:                Upper GI endoscopy Indications:              Epigastric abdominal pain Providers:                Mauri Pole, MD, Cleda Daub, RN,                            William Dalton, Technician Referring MD:              Medicines:                 Complications:            No immediate complications. Estimated Blood Loss:     Estimated blood loss was minimal. Procedure:                Pre-Anesthesia Assessment:                           - Prior to the procedure, a History and Physical                            was performed, and patient medications and                            allergies were reviewed. The patient's tolerance of                            previous anesthesia was also reviewed. The risks                            and benefits of the procedure and the sedation                            options and risks were discussed with the patient.                            All questions were answered, and informed consent                            was obtained. Prior Anticoagulants: The patient has                            taken no previous anticoagulant or antiplatelet                            agents. ASA Grade Assessment: III - A patient with                            severe systemic disease. After reviewing the risks  and benefits, the patient was deemed in                            satisfactory condition to undergo the procedure.                           After obtaining informed consent, the endoscope was                            passed under direct vision. Throughout the                            procedure, the patient's blood pressure, pulse, and                            oxygen saturations were  monitored continuously. The                            GIF-H190 ([id number]) Olympus gastroscope was                            introduced through the mouth, and advanced to the                            second part of duodenum. The upper GI endoscopy was                            accomplished without difficulty. The patient                            tolerated the procedure well. Scope In: Scope Out: Findings:      The Z-line was regular and was found 35 cm from the incisors.      No gross lesions were noted in the entire esophagus.      Few non-bleeding linear gastric ulcers with no stigmata of bleeding were       found in the gastric antrum. The largest lesion was 10 mm in largest       dimension.      Patchy moderate inflammation characterized by congestion (edema),       erosions, erythema and mucus was found in the entire examined stomach.       Biopsies were taken with a cold forceps for Helicobacter pylori testing.      A few 1 to 12 mm pedunculated and sessile polyps were found in the       gastric fundus and in the gastric body. Biopsies were taken with a cold       forceps for histology.      The examined duodenum was normal. Impression:               - Z-line regular, 35 cm from the incisors.                           - No gross lesions in esophagus.                           -  Non-bleeding gastric ulcers with no stigmata of                            bleeding.                           - Gastritis. Biopsied.                           - A few gastric polyps. Biopsied.                           - Normal examined duodenum. Moderate Sedation:      Not Applicable - Patient had care per Anesthesia. Recommendation:           - Resume previous diet. Small frequent meals.                            Maintain blood sugar level within normal range,                            avoid hypo or hyperglycemia.                           - Continue present medications.                            - No aspirin, ibuprofen, naproxen, or other                            non-steroidal anti-inflammatory drugs.                           - Await pathology results.                           - Use Dexilant (dexlansoprazole) 60 mg PO daily.                           - Use sucralfate tablets 1 gram PO QID PRN.                           - Return to GI office at the next available                            appointment with APP in 4 weeks                           . Procedure Code(s):        --- Professional ---                           [id number], Esophagogastroduodenoscopy, flexible,                            transoral; with biopsy, single or multiple Diagnosis Code(s):        --- Professional ---  K25.9, Gastric ulcer, unspecified as acute or                            chronic, without hemorrhage or perforation                           K29.70, Gastritis, unspecified, without bleeding                           K31.7, Polyp of stomach and duodenum                           R10.13, Epigastric pain CPT copyright 2019 American Medical Association. All rights reserved. The codes documented in this report are preliminary and upon coder review may  be revised to meet current compliance requirements. Mauri Pole, MD 07/28/2018 8:59:29 AM This report has been signed electronically. Number of Addenda: 0

## 2018-07-28 NOTE — Transfer of Care (Signed)
Immediate Anesthesia Transfer of Care Note  Patient: [name]  Procedure(s) Performed: ESOPHAGOGASTRODUODENOSCOPY (EGD) WITH PROPOFOL (N/A ) BIOPSY  Patient Location: PACU and Endoscopy Unit  Anesthesia Type:MAC  Level of Consciousness: awake, alert  and oriented  Airway & Oxygen Therapy: Patient Spontanous Breathing and Patient connected to nasal cannula oxygen  Post-op Assessment: Report given to RN and Post -op Vital signs reviewed and stable  Post vital signs: Reviewed and stable  Last Vitals:  Vitals Value Taken Time  BP    Temp    Pulse 68 07/28/18 0853  Resp    SpO2 98 % 07/28/18 0853  Vitals shown include unvalidated device data.  Last Pain:  Vitals:   07/28/18 0731  TempSrc: Oral  PainSc:          Complications: No apparent anesthesia complications

## 2018-07-28 NOTE — Anesthesia Postprocedure Evaluation (Signed)
Anesthesia Post Note  Patient: [name]  Procedure(s) Performed: ESOPHAGOGASTRODUODENOSCOPY (EGD) WITH PROPOFOL (N/A ) BIOPSY     Patient location during evaluation: Endoscopy Anesthesia Type: MAC Level of consciousness: awake and alert Pain management: pain level controlled Vital Signs Assessment: post-procedure vital signs reviewed and stable Respiratory status: spontaneous breathing, nonlabored ventilation, respiratory function stable and patient connected to nasal cannula oxygen Cardiovascular status: blood pressure returned to baseline and stable Postop Assessment: no apparent nausea or vomiting Anesthetic complications: no    Last Vitals:  Vitals:   07/28/18 0900 07/28/18 0905  BP: (!) 172/86 (!) 139/98  Pulse: (!) 57 (!) 56  Resp: 18 (!) 24  Temp:  36.5 C  SpO2: 98% 99%    Last Pain:  Vitals:   07/28/18 0905  TempSrc:   PainSc: 0-No pain                 Deedra Pro L Uriyah Raska

## 2018-07-28 NOTE — Interval H&P Note (Signed)
History and Physical Interval Note:  07/28/2018 8:23 AM  [name]  has presented today for surgery, with the diagnosis of abdominal pain.  The various methods of treatment have been discussed with the patient and family. After consideration of risks, benefits and other options for treatment, the patient has consented to  Procedure(s): ESOPHAGOGASTRODUODENOSCOPY (EGD) WITH PROPOFOL (N/A) as a surgical intervention.  The patient's history has been reviewed, patient examined, no change in status, stable for surgery.  I have reviewed the patient's chart and labs.  Questions were answered to the patient's satisfaction.     [name]

## 2018-07-28 NOTE — Discharge Instructions (Signed)
Upper Endoscopy, Adult, Care After °This sheet gives you information about how to care for yourself after your procedure. Your health care provider may also give you more specific instructions. If you have problems or questions, contact your health care provider. °What can I expect after the procedure? °After the procedure, it is common to have: °· A sore throat. °· Mild stomach pain or discomfort. °· Bloating. °· Nausea. °Follow these instructions at home: ° °· Follow instructions from your health care provider about what to eat or drink after your procedure. °· Return to your normal activities as told by your health care provider. Ask your health care provider what activities are safe for you. °· Take over-the-counter and prescription medicines only as told by your health care provider. °· Do not drive for 24 hours if you were given a sedative during your procedure. °· Keep all follow-up visits as told by your health care provider. This is important. °Contact a health care provider if you have: °· A sore throat that lasts longer than one day. °· Trouble swallowing. °Get help right away if: °· You vomit blood or your vomit looks like coffee grounds. °· You have: °? A fever. °? Bloody, black, or tarry stools. °? A severe sore throat or you cannot swallow. °? Difficulty breathing. °? Severe pain in your chest or abdomen. °Summary °· After the procedure, it is common to have a sore throat, mild stomach discomfort, bloating, and nausea. °· Do not drive for 24 hours if you were given a sedative during the procedure. °· Follow instructions from your health care provider about what to eat or drink after your procedure. °· Return to your normal activities as told by your health care provider. °This information is not intended to replace advice given to you by your health care provider. Make sure you discuss any questions you have with your health care provider. °Document Released: 07/09/2011 Document Revised: 07/01/2017  Document Reviewed: 06/09/2017 °Elsevier Patient Education © 2020 [employer] ° °

## 2018-07-28 NOTE — Anesthesia Procedure Notes (Signed)
Procedure Name: MAC Date/Time: 07/28/2018 8:37 AM Performed by: Lollie Sails, CRNA Pre-anesthesia Checklist: Patient identified, Emergency Drugs available, Suction available, Patient being monitored and Timeout performed Oxygen Delivery Method: Nasal cannula

## 2018-07-29 ENCOUNTER — Telehealth: Payer: Self-pay | Admitting: Gastroenterology

## 2018-07-29 NOTE — Telephone Encounter (Signed)
Patient called said that she was told that she would receive a call to sch a f-up she check if her ulcer heals well and she she has not heard from any one.

## 2018-07-29 NOTE — Telephone Encounter (Signed)
Patient returned my call and I informed her that she is going to be scheduled to see a APP for 4 week follow-up but unfortunately we do not have a schedule for APP that far out. Informed patient I will contact her in 1-2 weeks to schedule her follow up visit. Patient states she does need a appt around 1:30pm since her son will be driving her.  Informed patient we will be in touch.

## 2018-07-29 NOTE — Telephone Encounter (Signed)
Left a message for patient to return my call. 

## 2018-07-30 ENCOUNTER — Encounter: Payer: Self-pay | Admitting: Gastroenterology

## 2018-07-30 ENCOUNTER — Encounter (HOSPITAL_COMMUNITY): Payer: Self-pay | Admitting: Gastroenterology

## 2018-08-05 ENCOUNTER — Ambulatory Visit: Payer: 59 | Admitting: Registered"

## 2018-08-08 ENCOUNTER — Other Ambulatory Visit: Payer: Self-pay | Admitting: Cardiovascular Disease

## 2018-08-10 NOTE — Telephone Encounter (Signed)
Called patient to schedule her 4 week f/u visit with a APP. Patient states she has not left her home during the pandemic besides for when she was in the hospital and wishes to not have a in person appt. Explained to patient we are taking extra precautions necessary to keep her safe. Patient states she is still worried and she would prefer to be scheduled for a direct repeat EGD to check for ulcer healing. Patient states she still has stomach pains after she eats. Please advise Dr. Silverio Decamp.

## 2018-08-10 NOTE — Telephone Encounter (Signed)
Please check if she will do virtual visit, next available. Thanks

## 2018-08-10 NOTE — Telephone Encounter (Signed)
Patient scheduled for [facility] virtual visit on 09/15/18 at 2:10pm. Patient agreed and verbalized understanding.

## 2018-08-11 ENCOUNTER — Other Ambulatory Visit: Payer: Self-pay | Admitting: Pulmonary Disease

## 2018-08-12 ENCOUNTER — Ambulatory Visit: Payer: 59 | Admitting: Registered"

## 2018-08-12 ENCOUNTER — Encounter: Payer: Self-pay | Admitting: Registered"

## 2018-08-12 DIAGNOSIS — K3184 Gastroparesis: Secondary | ICD-10-CM

## 2018-08-12 NOTE — Progress Notes (Signed)
Medical Nutrition Therapy:  Appt start time: 1200 end time:  1230  Due to COVID 19, I connected with  [name] on 08/12/18 by telephone and verified that I was speaking with the correct person using two identifiers (Full name and DOB).   I discussed the limitations of evaluation and management by phone visit. The patient expressed understanding and agreed to proceed.  Assessment:  Primary concerns today: Pt referred due to gastroparesis and metabolic syndrome. Pt reports that her EGD earlier this month showed a gastric ulcer. Reports she is scheduled to have another procedure on August 25 to assess if ulcer is healing. Pt reports she is still having abdominal pain. Reports she has been eating about the same as at previous phone appointment-2-3 Boost Plus per day if unable to eat a solid meal and if eating a solid meal will eat one meal per day. Pt is still including some high fiber foods such as raw fruits (cherries and watermelon) and vegetables (cabbage) and reports GI symptoms after consuming these foods. Pt had not eaten any solid food or drank any Boost Plus at time of appointment. Pt reports that Boost Plus calms her stomach. Pt reports she still does not feel comfortable coming in for face-to-face visits d/t COVID.  Food Allergies/Intolerances: mackerel.  GI Concerns: Pt still reports abdominal pain. Constipation is being managed with Miralax. Pt denies diarrhea or vomiting. Sometimes has nausea if having a lot of stomach pain.   Pertinent Lab Values: N/A  Preferred Learning Style:   No preference indicated   Learning Readiness:  Ready  MEDICATIONS: See list. Reviewed.    DIETARY INTAKE:  Usual eating pattern includes 1 meal of solid food and 0 snacks per day. 2-3 Boost Plus per day if unable to eat solid foods.   Common foods: grilled chicken.  Avoided foods: fried foods; eggs; milk (does not like taste of milk).    Typical Snacks: none reported.     Typical Beverages:  Gatorade (~6-12 oz), water (48-64 oz), Boost Plus (2-3 per/16-24 oz per day). Reports a fluid restriction of 3L per pt.   Location of Meals: living room but varies.   Electronics Present at [employer]: Yes: TV.  24-hr recall:  B ( AM): None reported.  Snk ( AM): None reported.  L (230 PM): watermelon (reports having diarrhea and pain) Snk ( PM): None reported.  D (~530 PM): 1/2 baked [place] wing, 2 tbsp yellow rice (reports stomach hurt) Snk ( PM): None reported. Beverages: typically 64 oz water, ~12-16 oz Gatorade  Usual physical activity: None reported.  Minutes/Week: None reported.   Progress Towards Goal(s):  Some progress.   Nutritional Diagnosis:  NB-1.1 Food and nutrition-related knowledge deficit As related to gastroparesis diet.  As evidenced by no previous nutrition education with a dietitian . NI-5.11.1 Predicted suboptimal nutrient intake As related to recent dx of gastroparesis .  As evidenced by pt's reported dietary recall and habits .    Intervention:  Nutrition counseling provided. Dietitian reiterated including foods which require less processing time (avoiding high fat and high fiber foods). Discussed including 2-3 Boost Plus if having current intake of one small solid meal and if unable to eat any solid food recommended 3-4 Boost Plus (1080-1140 kcal, 42-56 g pro). Encouraged pt again to keep food and symptom log to help pt identify triggering foods. Pt reports she is accepting to keeping log.    Instructions/Goals:   -Try to include a small portion of solid or  semi-solid food at least 3 times per day.  -When having solids, continue including a lean protein and low fiber carbohydrate (see lists) -Include low fiber, low fat, easily digestible foods (see handout recommended/not recommended lists)  -Avoid foods that are high in fiber and high in fat. Avoid food on not recommended list (see list) -Continue to avoid drinking right (~30 minutes) before meals as we do not  want to fill up on fluid and not be able to get in good nutritious foods.   -Avoid eating 3 hours before laying down.   If able to include some solids recommend also including 2 Boost Plus spread throughout the day. -If unable to include solids at a mealtime, drink a Boost Plus.  -If unable to include any solid food, do 3-4 Boost Plus that day.   Recommend writing down foods or using an app to track foods you eat and also noting how your stomach feels afterward. This can help identify triggering foods. Please have information at next appointment.  Contact dietitian with any questions/concerns via phone, My Chart, or email.  Teaching Method Utilized: Visual Auditory  Barriers to learning/adherence to lifestyle change: None reported.  Demonstrated degree of understanding via:  Teach Back   Monitoring/Evaluation:  Dietary intake, exercise, and body weight in 3 week(s).

## 2018-08-13 ENCOUNTER — Ambulatory Visit: Payer: 59 | Admitting: Registered"

## 2018-08-17 NOTE — Patient Instructions (Addendum)
Instructions/Goals:   -Try to include a small portion of solid or semi-solid food at least 3 times per day.  -When having solids, continue including a lean protein and low fiber carbohydrate (see lists) -Include low fiber, low fat, easily digestible foods (see handout recommended/not recommended lists)  -Avoid foods that are high in fiber and high in fat. Avoid food on not recommended list (see list) -Continue to avoid drinking right (~30 minutes) before meals as we do not want to fill up on fluid and not be able to get in good nutritious foods.   -Avoid eating 3 hours before laying down.   If able to include some solids recommend also including 2 Boost Plus spread throughout the day. -If unable to include solids at a mealtime, drink a Boost Plus.  -If unable to include any solid food, do 3-4 Boost Plus that day.   Recommend writing down foods or using an app to track foods you eat and also noting how your stomach feels afterward. This can help identify triggering foods. Please have information at next appointment.  Contact dietitian with any questions/concerns via phone, My Chart, or email.

## 2018-09-02 ENCOUNTER — Encounter: Payer: Self-pay | Admitting: Registered"

## 2018-09-02 ENCOUNTER — Telehealth: Payer: Self-pay | Admitting: Registered"

## 2018-09-02 NOTE — Telephone Encounter (Signed)
Dietitian called for check in and to discuss f/u appointment. Pt reports things are about the same as reported during last visit. Reports intake remains about the same. Reports having pain on same side as ulcer and diarrhea after eating solid foods. Reports she feels better after she has a bowel movement. Pt does not report any new concerns or questions. Reports she is going to have her doctor fax over her last lab work. She reports she wants it sent to confirm/show she does not have diabetes. Dietitian let pt know she was not referred to office for diabetes but for gastroparesis.   Offered pt MyChart virtual visit for next appointment. Pt reports she would like to schedule MyChart visit after her next phone appointment with her doctor and wants to ensure directions are sent to her beforehand. Advised pt office will schedule her for MyChart visit (09/23/18 at 12:00-12:30 pm) and will send over directions beforehand. Dietitian discussed pt continuing with goals discussed at last appointment and encouraged pt to include a food/symptom journal to help identify associations between foods and GI symptoms. Encouraged pt to have journal with her for [facility] appointment. Pt was agreeable to information discussed. Encouraged pt to contact dietitian if any questions or concerns prior to next appointment.

## 2018-09-04 ENCOUNTER — Other Ambulatory Visit: Payer: Self-pay

## 2018-09-04 MED ORDER — POTASSIUM CHLORIDE ER 20 MEQ PO TBCR: EXTENDED_RELEASE_TABLET | ORAL | 0 refills | Status: DC

## 2018-09-15 ENCOUNTER — Other Ambulatory Visit: Payer: Self-pay

## 2018-09-15 ENCOUNTER — Ambulatory Visit (INDEPENDENT_AMBULATORY_CARE_PROVIDER_SITE_OTHER): Payer: 59 | Admitting: Gastroenterology

## 2018-09-15 DIAGNOSIS — K257 Chronic gastric ulcer without hemorrhage or perforation: Secondary | ICD-10-CM

## 2018-09-15 DIAGNOSIS — K219 Gastro-esophageal reflux disease without esophagitis: Secondary | ICD-10-CM

## 2018-09-15 DIAGNOSIS — K581 Irritable bowel syndrome with constipation: Secondary | ICD-10-CM

## 2018-09-15 MED ORDER — LUBIPROSTONE 24 MCG PO CAPS: 24.0000 ug | ORAL_CAPSULE | Freq: Two times a day (BID) | ORAL | 3 refills | Status: DC

## 2018-09-15 NOTE — Progress Notes (Signed)
[name]    IU:[id number]    [birth year]  Primary Care Physician:Boyd, Dola Factor, MD  Referring Physician: Bartholome Bill, MD [address] [place],  [place] [address]  This service was provided via  telemedicine due to [place] 19 pandemic.  I connected with@ on 09/15/18 at  2:10 PM EDT by a video enabled telemedicine application and verified that I am speaking with the correct person using two identifiers.  Patient location: Home Provider location: Office   I discussed the limitations, risks, security and privacy concerns of performing an evaluation and management service by video enabled telemedicine application and the availability of in person appointments. I also discussed with the patient that there may be a patient responsible charge related to this service. The patient expressed understanding and agreed to proceed.   The persons participating in this telemedicine service were myself and the patient    Chief complaint: Abdominal pain, constipation HPI:  58 year old female with morbid obesity, obstructive sleep apnea, hypertension here for follow-up visit Feeling somewhat better.  No longer vomiting.  Continues to have intermittent abdominal discomfort.  Denies any melena, decreased appetite, weight loss or blood per rectum. She continues to have constipation despite taking MiraLAX 1 capful twice daily   Relevant GI history: EGD July 28, 2018: Few superficial linear nonbleeding gastric ulcers in the antrum and gastritis.  Benign fundic gland polyps.  Esophagus and duodenum was normal.  EGD June 22, 2018 with moderate amount of residue food in the body and fundus of stomach with limited visualization of the lining, gastritis, biopsies negative for H. pylori   Chronic GERD was on Protonix, switch to [facility] April 2020 and started on Pepcid at bedtime  Dicyclomine for IBS  CT abdomen pelvis March 30, 2018: No acute  intra-abdominal or pelvic abnormality, moderate fat-containing umbilical hernia  Colonoscopy April 2019 with removal of 11 mm hamartomatous polyp in sigmoid colon  Colonoscopy November 04, 2003 normal with no polyps  Outpatient Encounter Medications as of 09/15/2018  Medication Sig  . albuterol (PROVENTIL) (2.5 MG/3ML) 0.083% nebulizer solution Take 3 mLs (2.5 mg total) by nebulization every 4 (four) hours as needed for wheezing or shortness of breath.  [name] aspirin 81 MG EC tablet TAKE 1 TABLET BY MOUTH EVERY DAY (Patient taking differently: Take 81 mg by mouth daily. )  . benzonatate (TESSALON) 200 MG capsule Take 1 capsule (200 mg total) by mouth 3 (three) times daily as needed for cough. (Patient taking differently: Take 200 mg by mouth 2 (two) times daily as needed for cough. )  . carvedilol (COREG) 12.5 MG tablet TAKE 1 TABLET BY MOUTH TWICE A DAY WITH FOOD (Patient taking differently: Take 12.5 mg by mouth 2 (two) times daily with a meal. )  . dexlansoprazole (DEXILANT) 60 MG capsule Take 1 capsule (60 mg total) by mouth every morning. Take 30 minutes before breakast  . dicyclomine (BENTYL) 10 MG capsule Take 1 capsule (10 mg total) by mouth 3 (three) times daily before meals for 5 days. (Patient taking differently: Take 10 mg by mouth 4 (four) times daily -  before meals and at bedtime. )  . fluticasone (FLONASE) 50 MCG/ACT nasal spray SPRAY 2 SPRAYS INTO EACH NOSTRIL EVERY DAY  . Guaifenesin (MUCINEX MAXIMUM STRENGTH) 1200 MG TB12 Take 1,200 mg by mouth daily.  . hydrALAZINE (APRESOLINE) 25 MG tablet TAKE 1 TABLET BY MOUTH THREE TIMES A DAY (Patient  taking differently: Take 25 mg by mouth 3 (three) times daily. )  . isosorbide dinitrate (ISORDIL) 20 MG tablet Take 1 tablet (20 mg total) by mouth 3 (three) times daily.  [name] levocetirizine (XYZAL) 5 MG tablet Take 5 mg by mouth every evening.  . loperamide (IMODIUM) 2 MG capsule Take 1 capsule (2 mg total) by mouth 4 (four) times daily as needed  for diarrhea or loose stools.  [name] losartan (COZAAR) 50 MG tablet TAKE 1 TABLET BY MOUTH EVERY DAY (Patient taking differently: Take 50 mg by mouth daily. )  . metolazone (ZAROXOLYN) 2.5 MG tablet Take 2.5 mg by mouth daily as needed (FOR OVERNIGHT WEIGHT GAIN OF 3 lbs. or more).  . montelukast (SINGULAIR) 10 MG tablet TAKE 1 TABLET(10 MG) BY MOUTH AT BEDTIME (Patient taking differently: Take 10 mg by mouth at bedtime. )  . ondansetron (ZOFRAN) 4 MG tablet Take 1 tablet (4 mg total) by mouth every 8 (eight) hours as needed for nausea or vomiting.  . polyethylene glycol powder (GLYCOLAX/MIRALAX) powder Use 2 capfuls in the morning and 1 capful in the evening and as directed (Patient taking differently: Take 17-34 g by mouth daily as needed for moderate constipation. )  . Potassium Chloride ER 20 MEQ TBCR TAKE 3 TABLETS BY MOUTH 3 TIMES A DAY  . promethazine (PHENERGAN) 25 MG tablet Take 1 tablet (25 mg total) by mouth every 6 (six) hours as needed for nausea or vomiting.  [name] Respiratory Therapy Supplies (FLUTTER) DEVI 1 application by Does not apply route as directed. (Patient not taking: Reported on 07/20/2018)  . spironolactone (ALDACTONE) 25 MG tablet TAKE 1/2 TABLET BY MOUTH TWICE A DAY (Patient taking differently: Take 12.5 mg by mouth 2 (two) times daily. )  . sucralfate (CARAFATE) 1 g tablet Please specify directions, refills and quantity (Patient taking differently: Take 1 g by mouth 4 (four) times daily -  with meals and at bedtime. )  . topiramate (TOPAMAX) 25 MG tablet Take 25 mg by mouth 2 (two) times daily as needed (for migraines).   . torsemide (DEMADEX) 20 MG tablet TAKE 2 TABLETS(40 MG) BY MOUTH TWICE DAILY (Patient taking differently: Take 40 mg by mouth 2 (two) times daily. )  . traMADol (ULTRAM) 50 MG tablet Take 1 tablet (50 mg total) by mouth every 6 (six) hours as needed. (Patient taking differently: Take 50 mg by mouth every 6 (six) hours as needed for moderate pain. )  .  [DISCONTINUED] potassium chloride SA (K-DUR,KLOR-CON) 20 MEQ tablet TAKE 3 TABLETS (60 MEQ) BY MOUTH THREE TIMES DAILY (Patient taking differently: Take 180 mEq by mouth daily. )   No facility-administered encounter medications on file as of 09/15/2018.     Allergies as of 09/15/2018 - Review Complete 09/02/2018  Allergen Reaction Noted  . Sulfonamide derivatives Anaphylaxis, Swelling, Rash, and Other (See Comments) 10/21/2006  . Doxycycline Nausea And Vomiting 01/05/2008  . Latex Hives 09/28/2011  . Ciprofloxacin Rash 10/21/2006  . Fish allergy Rash and Other (See Comments) 03/08/2012  . Hydrocortisone Rash   . Neomycin Rash 10/21/2006    Past Medical History:  Diagnosis Date  . Acute on chronic diastolic CHF (congestive heart failure) ([place]) 04/11/2014  . Acute suppurative otitis media without spontaneous rupture of eardrum 02/04/2007   Centricity Description: OTITIS MEDIA, SUPPURATIVE, ACUTE, BILATERAL Qualifier: Diagnosis of  By: [name] MD, [name]  Centricity Description: OTITIS MEDIA, SUPPURATIVE, ACUTE Qualifier: Diagnosis of  By: [name] MD, [name]   . Allergic  rhinitis   . Allergy   . Anemia   . Anxiety   . Arthritis   . CAD (coronary artery disease)    LHC (11/29/1999):  EF 60%; no significant CAD.  [name] Carpal tunnel syndrome   . Carpal tunnel syndrome 06/09/2006   Qualifier: Diagnosis of  By: Marca Ancona RMA, Lucy    . Chronic diastolic CHF (congestive heart failure) ([place])    a. Echo (01/21/11): Vigorous LVF, EF 65-70%, no RWMA, Gr 2 DD. b.  Echo (12/15): Moderate LVH, EF 123456, grade 2 diastolic dysfunction, mild LAE, normal RV function c. 01/2015: echo showing EF of 60-65% with mild LVH  . Common migraine   . COPD (chronic obstructive pulmonary disease) ([place])   . Diastolic heart failure ([place])   . Diverticulitis   . GERD (gastroesophageal reflux disease)   . Hepatic steatosis   . Hepatomegaly   . Hiatal hernia   . History of cardiomegaly   . Hyperlipidemia   . Hypertension    . IBS (irritable bowel syndrome)   . Internal hemorrhoids   . Metabolic syndrome   . Morbid obesity ([place])   . OSA (obstructive sleep apnea)    CPAP dependent  . PNEUMONIA, ORGAN UNSPECIFIED 03/01/2009   Qualifier: Diagnosis of  By: [name] CMA, [name]    . Sinusitis, chronic 03/10/2012   CT sinuses 02/2012:  Chronic sinusitis with small A/F levels >> rx by ENT with prolonged augmentin F/u ct sinuses 04/2012:  Persistent sinus thickening >> rx with levaquin and clinda for 30 days.  Told to return for sinus scan in 6 weeks >> no showed for followup visit.  CT sinuses 11/2013:  Acute and chronic sinusitis noted.    . Sleep apnea    bi pap    Past Surgical History:  Procedure Laterality Date  . ABDOMINAL HYSTERECTOMY    . BIOPSY  06/22/2018   Procedure: BIOPSY;  Surgeon: Mauri Pole, MD;  Location: [facility];  Service: Endoscopy;;  . BIOPSY  07/28/2018   Procedure: BIOPSY;  Surgeon: Mauri Pole, MD;  Location: [facility];  Service: Endoscopy;;  . CESAREAN SECTION    . COLONOSCOPY    . COLONOSCOPY WITH PROPOFOL N/A 05/05/2017   Procedure: COLONOSCOPY WITH PROPOFOL;  Surgeon: Mauri Pole, MD;  Location: [facility];  Service: Endoscopy;  Laterality: N/A;  . ESOPHAGOGASTRODUODENOSCOPY (EGD) WITH PROPOFOL N/A 06/22/2018   Procedure: ESOPHAGOGASTRODUODENOSCOPY (EGD) WITH PROPOFOL;  Surgeon: Mauri Pole, MD;  Location: [facility];  Service: Endoscopy;  Laterality: N/A;  . ESOPHAGOGASTRODUODENOSCOPY (EGD) WITH PROPOFOL N/A 07/28/2018   Procedure: ESOPHAGOGASTRODUODENOSCOPY (EGD) WITH PROPOFOL;  Surgeon: Mauri Pole, MD;  Location: [facility];  Service: Endoscopy;  Laterality: N/A;  . NASAL SINUS SURGERY    . PARTIAL HYSTERECTOMY    . TONSILLECTOMY    . TUBAL LIGATION    . UVULOPALATOPHARYNGOPLASTY      Family History  Problem Relation Age of Onset  . Heart disease Mother   . Heart attack Mother   . Hypertension Mother   . Coronary artery disease  Other        1st degree relatvie <50  . Breast cancer Other        aunt- ? paternal or maternal  . Asthma Sister   . Colon polyps Sister   . Stroke Neg Hx   . Colon cancer Neg Hx   . Esophageal cancer Neg Hx   . Rectal cancer Neg Hx   . Stomach cancer Neg Hx  Social History   Socioeconomic History  . Marital status: Widowed    Spouse name: Not on file  . Number of children: 2  . Years of education: Not on file  . Highest education level: Not on file  Occupational History  . Occupation: Retired  Scientific laboratory technician  . Financial resource strain: Not on file  . Food insecurity    Worry: Not on file    Inability: Not on file  . Transportation needs    Medical: Not on file    Non-medical: Not on file  Tobacco Use  . Smoking status: Never Smoker  . Smokeless tobacco: Never Used  Substance and Sexual Activity  . Alcohol use: No  . Drug use: No  . Sexual activity: Not on file  Lifestyle  . Physical activity    Days per week: Not on file    Minutes per session: Not on file  . Stress: Not on file  Relationships  . Social Herbalist on phone: Not on file    Gets together: Not on file    Attends religious service: Not on file    Active member of club or organization: Not on file    Attends meetings of clubs or organizations: Not on file    Relationship status: Not on file  . Intimate partner violence    Fear of current or ex partner: Not on file    Emotionally abused: Not on file    Physically abused: Not on file    Forced sexual activity: Not on file  Other Topics Concern  . Not on file  Social History Narrative   Charity fundraiser, Oceanographer.   Widowed, lives with son.      Review of systems: Review of Systems as per HPI All other systems reviewed and are negative.   Observations/Objective:   Data Reviewed:  Reviewed labs, radiology imaging, old records and pertinent past GI work up   Assessment and Plan/Recommendations:   58 year old female with morbid obesity, hypertension, hyperlipidemia, metabolic syndrome  EGD with linear gastric ulcers and gastritis Gastric emptying scan with no evidence of delayed gastric emptying or gastroparesis  Reassured patient that biopsies negative for dysplasia, intestinal metaplasia or H. pylori infection.  Gastric erosions and ulcers will heal with PPI  Continue Dexilant and antireflux measures Small frequent meals  Constipation Increase dietary fiber and fluid intake Continue MiraLAX 1 capful twice daily And Amitiza 24 mcg twice daily  Return in 2 to 3 months or sooner if needed   I discussed the assessment and treatment plan with the patient. The patient was provided an opportunity to ask questions and all were answered. The patient agreed with the plan and demonstrated an understanding of the instructions.   The patient was advised to call back or seek an in-person evaluation if the symptoms worsen or if the condition fails to improve as anticipated.     Harl Bowie, MD   CC: Bartholome Bill, MD

## 2018-09-15 NOTE — Patient Instructions (Addendum)
Start Amitiza 24 mcg twice daily, please send refills for 3 months  Continue MiraLAX 1 capful twice daily  Continue Dexilant  Follow-up office visit in 2 to 3 months.  I appreciate the  opportunity to care for you  Thank You   Harl Bowie , MD

## 2018-09-17 ENCOUNTER — Encounter: Payer: Self-pay | Admitting: Gastroenterology

## 2018-09-21 ENCOUNTER — Other Ambulatory Visit: Payer: Self-pay | Admitting: *Deleted

## 2018-09-21 MED ORDER — SUCRALFATE 1 G PO TABS: 1.0000 g | ORAL_TABLET | Freq: Three times a day (TID) | ORAL | 1 refills | Status: DC

## 2018-09-21 NOTE — Progress Notes (Signed)
Fax request from pharmacy to refill carafate

## 2018-09-23 ENCOUNTER — Encounter: Payer: Medicare Other | Attending: Gastroenterology | Admitting: Registered"

## 2018-09-23 DIAGNOSIS — E8881 Metabolic syndrome: Secondary | ICD-10-CM | POA: Insufficient documentation

## 2018-09-23 DIAGNOSIS — K3184 Gastroparesis: Secondary | ICD-10-CM | POA: Insufficient documentation

## 2018-09-29 ENCOUNTER — Encounter: Payer: Self-pay | Admitting: Registered"

## 2018-09-29 NOTE — Progress Notes (Addendum)
Medical Nutrition Therapy:  Appt start time: 1200 end time:  1230  Due to COVID 19, I connected with  [name] on 09/23/18 by telephone and verified that I was speaking with the correct person using two identifiers (Full name and DOB).   I discussed the limitations of evaluation and management by phone visit. The patient expressed understanding and agreed to proceed.  Assessment:  Primary concerns today: Pt referred due to gastroparesis and metabolic syndrome. Nutrition Follow-Up: Pt was scheduled for a My Chart appointment but pt reported she was unable to get her My Chart Video to work. Reports she checked with IT services and was unable to get it resolved. Dietitian called pt and appointment was completed over the phone.   Pt reports since receiving medication for her IBS she has been feeling much better with much improvement in stomach pain. Reports she has been able to get in solid food usually one time each day. Pt reports she has not been including any Boost because she was told it is too high in sugar.   Food Allergies/Intolerances: mackerel.  GI Concerns: Pt reports improvement in GI symptoms since starting Amitiza.   Pertinent Lab Values: N/A  Preferred Learning Style:   No preference indicated   Learning Readiness:  Ready  MEDICATIONS: See list. Reviewed.    DIETARY INTAKE:  Usual eating pattern includes 1-2 meals per day.   Common foods: grilled chicken.  Avoided foods: fried foods; eggs; milk (does not like taste of milk).    Typical Snacks: none reported.     Typical Beverages: Gatorade (~6-12 oz), water (48-64 oz), Reports a fluid restriction of 3L per pt.   Location of Meals: living room but varies.   Electronics Present at [employer]: Yes: TV.  24-hr recall:  B ( AM):  None reported.  Snk ( AM): None reported.  L (11-1128 PM): Poptart  Snk ( PM):  None reported.  D (5-530 PM): Spaghetti  Snk ( PM): None reported.  Beverages: 3 bottles water; 16 oz  fruit drink   Usual physical activity: None reported.  Minutes/Week: None reported.   Progress Towards Goal(s):  Modified goal(s).   Nutritional Diagnosis:  NB-1.1 Food and nutrition-related knowledge deficit As related to IBS diet.  As evidenced by no reported education regarding IBS nutrition therapy . NI-5.11.1 Predicted suboptimal nutrient intake As related to skipping meals .  As evidenced by pt's reported dietary recall and habits .    Intervention:  Nutrition counseling provided. Since gastroparesis is no longer a concern and primary concerns include healing ulcer, IBS, and constipation, dietitian provided education regarding nutrition therapy for IBS and constipation. Discussed importance of trying to get in nutrition, even if in very small portions, every 3-5 hours and that small frequent meals are recommended for IBS as having an empty stomach can make IBS worse as well as eating too much at one time. Discussed that main goal is to include whole, solid foods over meal replacement supplements such as Boost, however the Boost if preferred to no nutrition if unable to include adequate solid foods. Now that pt is having much improvement in GI symptoms, can work toward more consistent solid food meals. Discussed higher fiber foods. Discussed trying My Chart Video again for next appointment once pt's IT issue is resolved. Office will f/u with pt regarding next appointment time.   Instructions/Goals:   -Try to eat something every 3-5 hours while awake. If appetite is low can do a snack size  portion of meal. Do want to avoid eating closer than 3 hours from when you lie down.   -Include well tolerated foods that provide a good source of fiber to help with constipation:   whole grains, fruits, and vegetables (see handout about constipation)  -Limit high fat foods and choose lowfat foods (see handout about IBS)   Handouts Provided:   Nutrition Therapy for IBS  Nutrition Therapy for  Constipation   Teaching Method Utilized: Visual Auditory  Barriers to learning/adherence to lifestyle change: None reported.  Demonstrated degree of understanding via:  Teach Back   Monitoring/Evaluation:  Dietary intake, exercise, and body weight prn. Office to f/u with pt regarding next f/u appointment.

## 2018-09-30 ENCOUNTER — Other Ambulatory Visit: Payer: Self-pay

## 2018-09-30 MED ORDER — POTASSIUM CHLORIDE ER 20 MEQ PO TBCR: EXTENDED_RELEASE_TABLET | ORAL | 0 refills | Status: DC

## 2018-09-30 NOTE — Telephone Encounter (Signed)
Requested Prescriptions   Signed Prescriptions Disp Refills  . Potassium Chloride ER 20 MEQ TBCR 90 tablet 0    Sig: TAKE 3 TABLETS BY MOUTH 3 TIMES A DAY    Authorizing Provider: Skeet Latch    Ordering User: NEWCOMER MCCLAIN, BRANDY L

## 2018-10-05 ENCOUNTER — Other Ambulatory Visit: Payer: Self-pay | Admitting: Cardiovascular Disease

## 2018-10-05 NOTE — Telephone Encounter (Signed)
Refill

## 2018-10-07 ENCOUNTER — Other Ambulatory Visit: Payer: Self-pay

## 2018-10-07 NOTE — Telephone Encounter (Signed)
° °  Patient calling, states [facility] only gave her 30 pills. Patient is not due to be seen until 01/2019 with Dr Oval Linsey. Patient requesting refill be corrected/ updated to reflect no appointment needed. Patient declined to schedule virtual visit

## 2018-10-08 NOTE — Telephone Encounter (Signed)
Refill was sent in on 09/30/18 with #90 pills. Notes on refill can be updated when sent in next time.

## 2018-10-09 NOTE — Patient Instructions (Addendum)
Instructions/Goals:   -Try to eat something every 3-5 hours while awake. If appetite is low can do a snack size portion of meal. Do want to avoid eating closer than 3 hours from when you lie down.   -Include well tolerated foods that provide a good source of fiber to help with constipation:   whole grains, fruits, and vegetables (see handout about constipation)  -Limit high fat foods and choose lowfat foods (see handout about IBS)

## 2018-10-21 ENCOUNTER — Telehealth: Payer: Self-pay | Admitting: Internal Medicine

## 2018-10-21 ENCOUNTER — Ambulatory Visit (INDEPENDENT_AMBULATORY_CARE_PROVIDER_SITE_OTHER): Payer: 59 | Admitting: Acute Care

## 2018-10-21 ENCOUNTER — Telehealth: Payer: Self-pay | Admitting: Pulmonary Disease

## 2018-10-21 ENCOUNTER — Encounter: Payer: Self-pay | Admitting: Acute Care

## 2018-10-21 ENCOUNTER — Other Ambulatory Visit: Payer: Self-pay

## 2018-10-21 DIAGNOSIS — U071 COVID-19: Secondary | ICD-10-CM | POA: Diagnosis not present

## 2018-10-21 DIAGNOSIS — R062 Wheezing: Secondary | ICD-10-CM | POA: Diagnosis not present

## 2018-10-21 MED ORDER — AZITHROMYCIN 250 MG PO TABS: ORAL_TABLET | ORAL | 0 refills | Status: DC

## 2018-10-21 MED ORDER — PREDNISONE 10 MG PO TABS: ORAL_TABLET | ORAL | 0 refills | Status: DC

## 2018-10-21 NOTE — Telephone Encounter (Signed)
Wrong dr.Stanley A Dalton

## 2018-10-21 NOTE — Telephone Encounter (Signed)
Spoke with pt. States that she tested positive for COVID on Monday and is very angry about this. She was advised by her PCP to call us if her mucus started changing colors. Reports that she is now coughing up green mucus. Denies chest tightness, wheezing, shortness of breath or fever at this time. Pt wants recommendations and possibly something sent in for her.  [name] - please advise. Thanks.

## 2018-10-21 NOTE — Telephone Encounter (Signed)
Please set her up for a tele visit at 10:30 today with me. I need to talk with her prior to prescribing anything.Thanks

## 2018-10-21 NOTE — Telephone Encounter (Signed)
Called spoke with patient, she wasn't sure how to use Mychart at this time and the instructions were confusing. She was set up for a televisit with SG at 1030

## 2018-10-21 NOTE — Patient Instructions (Addendum)
It is good to talk with you today. We will prescribe a prednisone taper . Prednisone taper; 10 mg tablets: 4 tabs x 2 days, 3 tabs x 2 days, 2 tabs x 2 days 1 tab x 2 days then stop. We will prescribe a z pack today. Take this as directed Continue Albuterol nebs as you have been doing for your wheezing.  Aggressive Pulmonary Toilet Increase flutter valve use 2-3 times daily 10 breaths each time Take Tylenol as needed for body aches Tessalon Perles for cough  Don't drive if sleepy Continue Chlortab's Continue Singulair Continue nasal saline rinses 2-3 times daily Continue Flonase Continue to monitor temperatures Quarantine yourself so you don't spread the virus Please contact office for sooner follow up if symptoms do not improve or worsen or seek emergency care    Continue on BiPAP at bedtime. You appear to be benefiting from the treatment  Goal is to wear for at least 6 hours each night for maximal clinical benefit. Continue to work on weight loss, as the link between excess weight  and sleep apnea is well established.   Remember to establish a good bedtime routine, and work on sleep hygiene.  Limit daytime naps , avoid stimulants such as caffeine and nicotine close to bedtime, exercise daily to promote sleep quality, avoid heavy spicy, fried , or rich foods before bed. Ensure adequate exposure to natural light during the day,establish a relaxing bedtime routine with a pleasant sleep environment ( Bedroom between 60 and 67 degrees, turn off bright lights , TV or device screens screens , consider black out curtains or white noise machines) Do not drive if sleepy. Remember to clean mask, tubing, filter, and reservoir once weekly with soapy water.  Follow up with Dr. [name]   In 3 months or before as needed.    Call us if you need [place] for anything  [facility]

## 2018-10-21 NOTE — Progress Notes (Signed)
Virtual Visit via Telephone Note  I connected with [name] on 10/21/18 at 10:30 AM EDT by telephone and verified that I am speaking with the correct person using two identifiers.  Location: Patient: At home Provider: In the office at [facility], [place], [place], [address]   I discussed the limitations, risks, security and privacy concerns of performing an evaluation and management service by telephone and the availability of in person appointments. I also discussed with the patient that there may be a patient responsible charge related to this service. The patient expressed understanding and agreed to proceed.  Synopsis 71 yobf never smoker  with   Chronic cough, h/o sinusitis and ? Asthma with recurrent severe cough since her 40's "every few months" . She recently tested + for COVID. She was told by her PCP to call Pulmonary if she had any change in color of secretions. She states her secretions have changed to green and she called requesting something be sent in. We are seeing her as a telephone visit prior to prescribing any treatments. She is followed by Dr. Melvyn [name], and  Dr. Halford [name] .   History of Present Illness: Pt. Presents for a telephonic OV. She called the office this am and informed us that she has tested positive for COVID. She has developed a change in her secretions from clear to green and has started to wheeze.She states she had a COVID test Sunday 9/27,she was called Monday 9/28  with the positive results. She has been quarantining in her room since Sunday. She does use a mask and gloves whenever she comes out of her bedroom. She states she has been using her albuterol treatments for  wheezing. She has been doing this every 4 hours since Monday. She has been taking Tessalon Perles for cough. She states she is using her flutter valve daily.  She states her son has also tested positive for COVID and she feels this is who exposed her.She states has had fever. She states she has a  temp of 101.6 max. Today she is 99.1. She states she does have body aches. She is taking tylenol as needed for symptomatic relief.She states her sense of taste and smell do not seem to have been affected. She does complain of post nasal gtt which is making her cough worse. She denies chest pain, orthopnea of hemoptysis.   Observations/Objective:  10/21/2018 BiPAP Down Load AirCurve 10 VAuto Spontaneous Mode IPAP 15 cm H2O EPAP 11 cm H2O Usage days 30/30 or 100% > 4 hours 29 days ( 97%) AHI 2.4  10/21/2018 temperature 99.1 10/21/2018-weight- 285 pounds 03/11/2018-office spirometry-FVC 2.6 (78% predicted), ratio 80, FEV1 2 (92% predicted), normal spirometry  Assessment and Plan: New COVID diagnosis ( Done at [facility] we cannot see the results) Swabbed 9/27, Called with + 9/28 Change in color of secretions to light green  and new onset wheezing She is quarantining in her bedroom  She has family checking on her daily.  Plan We will prescribe a prednisone taper . Prednisone taper; 10 mg tablets: 4 tabs x 2 days, 3 tabs x 2 days, 2 tabs x 2 days 1 tab x 2 days then stop. We will prescribe a z pack today. Take this as directed Continue Albuterol nebs as you have been doing for your wheezing.  Aggressive Pulmonary Toilet Increase flutter valve use 2-3 times daily 10 breaths each time Continue to monitor temperatures Take Tylenol as directed for symptomatic relief Go to the ER for  worsening in breathing that does not improve with albuterol nebs. Please contact office for sooner follow up if symptoms do not improve or worsen or seek emergency care   OSA Morbid Obesity Compliant with BiPAP per Down Load States she weighs 285 pounds Plan Continue on BiPAP at bedtime. You appear to be benefiting from the treatment  Goal is to wear for at least 6 hours each night for maximal clinical benefit. Continue to work on weight loss, as the link between excess weight  and sleep apnea is well established.    Remember to establish a good bedtime routine, and work on sleep hygiene.  Limit daytime naps , avoid stimulants such as caffeine and nicotine close to bedtime, exercise daily to promote sleep quality, avoid heavy spicy, fried , or rich foods before bed. Ensure adequate exposure to natural light during the day,establish a relaxing bedtime routine with a pleasant sleep environment ( Bedroom between 60 and 67 degrees, turn off bright lights , TV or device screens screens , consider black out curtains or white noise machines) Do not drive if sleepy. Remember to clean mask, tubing, filter, and reservoir once daily while sick  with soapy water or white vinegar.  Follow up with Dr. Halford [name]   In 3 months or before as needed.    Follow Up Instructions: Follow up Tele visit in 1 week to make sure improving with treatment I emphasized that I want [name] to call us if she needs [place] for anything prior to this follow up Televisit.    I discussed the assessment and treatment plan with the patient. The patient was provided an opportunity to ask questions and all were answered. The patient agreed with the plan and demonstrated an understanding of the instructions.   The patient was advised to call back or seek an in-person evaluation if the symptoms worsen or if the condition fails to improve as anticipated.  I provided 34  minutes of non-face-to-face time during this encounter.   Magdalen Spatz, NP 10/21/2018 11:04 AM

## 2018-10-26 ENCOUNTER — Emergency Department (HOSPITAL_COMMUNITY): Payer: 59

## 2018-10-26 ENCOUNTER — Emergency Department (HOSPITAL_COMMUNITY)
Admission: EM | Admit: 2018-10-26 | Discharge: 2018-10-26 | Disposition: A | Discharge status: Home / Self Care | Payer: 59 | Attending: Emergency Medicine | Admitting: Emergency Medicine

## 2018-10-26 ENCOUNTER — Telehealth: Payer: Self-pay | Admitting: Internal Medicine

## 2018-10-26 ENCOUNTER — Encounter (HOSPITAL_COMMUNITY): Payer: Self-pay | Admitting: Emergency Medicine

## 2018-10-26 ENCOUNTER — Other Ambulatory Visit: Payer: Self-pay

## 2018-10-26 DIAGNOSIS — N183 Chronic kidney disease, stage 3 unspecified: Secondary | ICD-10-CM | POA: Insufficient documentation

## 2018-10-26 DIAGNOSIS — J449 Chronic obstructive pulmonary disease, unspecified: Secondary | ICD-10-CM | POA: Insufficient documentation

## 2018-10-26 DIAGNOSIS — Z79899 Other long term (current) drug therapy: Secondary | ICD-10-CM | POA: Diagnosis not present

## 2018-10-26 DIAGNOSIS — I5032 Chronic diastolic (congestive) heart failure: Secondary | ICD-10-CM | POA: Diagnosis not present

## 2018-10-26 DIAGNOSIS — U071 COVID-19: Secondary | ICD-10-CM | POA: Diagnosis not present

## 2018-10-26 DIAGNOSIS — Z91040 Latex allergy status: Secondary | ICD-10-CM | POA: Insufficient documentation

## 2018-10-26 DIAGNOSIS — I2510 Atherosclerotic heart disease of native coronary artery without angina pectoris: Secondary | ICD-10-CM | POA: Diagnosis not present

## 2018-10-26 DIAGNOSIS — I130 Hypertensive heart and chronic kidney disease with heart failure and stage 1 through stage 4 chronic kidney disease, or unspecified chronic kidney disease: Secondary | ICD-10-CM | POA: Diagnosis not present

## 2018-10-26 DIAGNOSIS — Z7982 Long term (current) use of aspirin: Secondary | ICD-10-CM | POA: Diagnosis not present

## 2018-10-26 DIAGNOSIS — J189 Pneumonia, unspecified organism: Secondary | ICD-10-CM | POA: Diagnosis not present

## 2018-10-26 DIAGNOSIS — R0789 Other chest pain: Secondary | ICD-10-CM | POA: Diagnosis present

## 2018-10-26 LAB — COMPREHENSIVE METABOLIC PANEL
ALT: 35 U/L (ref 0–44)
AST: 31 U/L (ref 15–41)
Albumin: 3.5 g/dL (ref 3.5–5.0)
Alkaline Phosphatase: 86 U/L (ref 38–126)
Anion gap: 11 (ref 5–15)
BUN: 32 mg/dL — ABNORMAL HIGH (ref 6–20)
CO2: 19 mmol/L — ABNORMAL LOW (ref 22–32)
Calcium: 8.8 mg/dL — ABNORMAL LOW (ref 8.9–10.3)
Chloride: 106 mmol/L (ref 98–111)
Creatinine, Ser: 1.07 mg/dL — ABNORMAL HIGH (ref 0.44–1.00)
GFR calc Af Amer: >60 mL/min (ref >60)
GFR calc non Af Amer: 57 mL/min — ABNORMAL LOW (ref >60)
Glucose, Bld: 112 mg/dL — ABNORMAL HIGH (ref 70–99)
Potassium: 3.5 mmol/L (ref 3.5–5.1)
Sodium: 136 mmol/L (ref 135–145)
Total Bilirubin: 0.8 mg/dL (ref 0.3–1.2)
Total Protein: 8.3 g/dL — ABNORMAL HIGH (ref 6.5–8.1)

## 2018-10-26 LAB — CBC WITH DIFFERENTIAL/PLATELET
Abs Immature Granulocytes: 0.06 10*3/uL (ref 0.00–0.07)
Basophils Absolute: 0 10*3/uL (ref 0.0–0.1)
Basophils Relative: 0 %
Eosinophils Absolute: 0 10*3/uL (ref 0.0–0.5)
Eosinophils Relative: 0 %
HCT: 42.1 % (ref 36.0–46.0)
Hemoglobin: 13.2 g/dL (ref 12.0–15.0)
Immature Granulocytes: 1 %
Lymphocytes Relative: 31 %
Lymphs Abs: 2.4 10*3/uL (ref 0.7–4.0)
MCH: 29.2 pg (ref 26.0–34.0)
MCHC: 31.4 g/dL (ref 30.0–36.0)
MCV: 93.1 fL (ref 80.0–100.0)
Monocytes Absolute: 0.5 10*3/uL (ref 0.1–1.0)
Monocytes Relative: 7 %
Neutro Abs: 4.8 10*3/uL (ref 1.7–7.7)
Neutrophils Relative %: 61 %
Platelets: 238 10*3/uL (ref 150–400)
RBC: 4.52 MIL/uL (ref 3.87–5.11)
RDW: 13.9 % (ref 11.5–15.5)
WBC: 7.8 10*3/uL (ref 4.0–10.5)
nRBC: 0 % (ref 0.0–0.2)

## 2018-10-26 LAB — LACTIC ACID, PLASMA: Lactic Acid, Venous: 1.1 mmol/L (ref 0.5–1.9)

## 2018-10-26 MED ORDER — PROMETHAZINE HCL 25 MG PO TABS: 25.0000 mg | ORAL_TABLET | Freq: Four times a day (QID) | ORAL | 0 refills | Status: DC | PRN

## 2018-10-26 MED ORDER — PROMETHAZINE HCL 25 MG PO TABS
25.0000 mg | ORAL_TABLET | Freq: Once | ORAL | Status: AC
  Administered 2018-10-26: 25 mg via ORAL
  Filled 2018-10-26: qty 1

## 2018-10-26 MED ORDER — DOXYCYCLINE HYCLATE 100 MG PO CAPS: 100.0000 mg | ORAL_CAPSULE | Freq: Two times a day (BID) | ORAL | 0 refills | Status: DC

## 2018-10-26 MED ORDER — AMOXICILLIN-POT CLAVULANATE 875-125 MG PO TABS: 1.0000 | ORAL_TABLET | Freq: Two times a day (BID) | ORAL | 0 refills | Status: DC

## 2018-10-26 MED ORDER — AMOXICILLIN-POT CLAVULANATE 875-125 MG PO TABS
1.0000 | ORAL_TABLET | Freq: Once | ORAL | Status: AC
  Administered 2018-10-26: 1 via ORAL
  Filled 2018-10-26: qty 1

## 2018-10-26 MED ORDER — DOXYCYCLINE HYCLATE 100 MG PO TABS
100.0000 mg | ORAL_TABLET | Freq: Once | ORAL | Status: AC
  Administered 2018-10-26: 100 mg via ORAL
  Filled 2018-10-26: qty 1

## 2018-10-26 NOTE — Discharge Instructions (Signed)
Chest x-ray shows infection in your lungs.  Everything else done today was normal.  Your oxygen remained normal.  Take augmentin and doxycyline as prescribed.  Use phenergan for nausea as needed.  Continue breathing treatments, cough medicines at home.   Any illness can progress and worsen, at this time there are no indications of complications. You need close follow up with either primary care doctor or pulmonologist.  I have send Dr [name] a message to help you arrange an appointment for re-check in 48-72 hours.

## 2018-10-26 NOTE — ED Notes (Signed)
An After Visit Summary was printed and given to the patient. Discharge instructions given and no further questions at this time.  

## 2018-10-26 NOTE — Progress Notes (Signed)
5 ED visits in past six months  TOC CM spoke to pt and states she support at home. Has PCP, and able to afford meds.

## 2018-10-26 NOTE — ED Notes (Addendum)
Ambulated patient in room with pulse ox for 3 - 4 minutes. During ambulation, lowest 02sat noted was 94% on RA. RR ranged from 20 to low 30s. Pts gait was steady, just slow. Generalized weakness. No other issues observed. Pt back in bed resting and o2 at 99%RA. RN made aware.

## 2018-10-26 NOTE — ED Notes (Signed)
[facility] bedside 

## 2018-10-26 NOTE — ED Triage Notes (Signed)
Per [facility] pt from home for left side chest pains since last Friday when takes breath. Pt finished z-pack yesterday and been doing albuterol nebs every 4 hours at home. Reports that nebs "made my breathing worser and made me throw up". Pt reports was tested for COvid and was positive last week. Reports her fever will go up to 101.6 when tylenol wears off and will cause headache.

## 2018-10-26 NOTE — ED Notes (Signed)
[facility] called  

## 2018-10-26 NOTE — Progress Notes (Signed)
TOC CM received call from [facility] and doxycycline reduces effects of augmentin. Requested provided contact her, Geni Bers # [phone]. Message sent to EDP. [place], [place] [facility] TOC CM [phone]

## 2018-10-26 NOTE — ED Notes (Signed)
ED Provider at bedside. 

## 2018-10-26 NOTE — ED Provider Notes (Signed)
[facility] DEPT Provider Note   CSN: [id number] Arrival date & time: 10/26/18  1101     History   Chief Complaint Chief Complaint  Patient presents with   Chest Pain   Covid+    HPI [name] is a 57 y.o. female history of chronic cough, COPD/asthma, OSA on BiPAP, morbid obesity, chronic diastolic heart failure EF 65 to 70%, hypertension, hyperlipidemia IBS with constipation, ulcers, GERD, gastroparesis presents to the ER for evaluation of feeling worse after positive COVID-19 test on 9/27.  Patient states that she takes a monthly COVID-19 test because her son is always in and out of her house.  For the last week she has had recurrent fevers as high as 101 that resolved with Tylenol but come back when the Tylenol wears off.  She has associated congestion, nausea, vomiting, lightheadedness, hot sweats, generalized malaise and fatigue, diarrhea, abdominal pain, cough productive of white thick pastelike sputum, shortness of breath.  She describes the shortness of breath as "when I take a deep breath it feels like someone is cutting my airway off".  She feels tired when she tries to sit up and starts to move but denies any chest pain.  She went to her pulmonologist on 9/30 who prescribed her a Z-Pak and prednisone which she finished yesterday.  She has been taking the cough medicine and using her breathing treatments every 4 hours.  Over the last 24 hours she has felt worse.  She has sharp pains constantly but significantly worse when she wears her CPAP machine and when she does breathing treatments.  She called her pulmonologist who advised her to come to the ER due to her report of worsening shortness of breath and chest pains.  Her son tested positive for COVID as well.  She denies headaches, congestion, sore throat, hematemesis, melena, blood in her stool, dysuria.       HPI  Past Medical History:  Diagnosis Date   Acute on chronic diastolic CHF  (congestive heart failure) ([place]) 04/11/2014   Acute suppurative otitis media without spontaneous rupture of eardrum 02/04/2007   Centricity Description: OTITIS MEDIA, SUPPURATIVE, ACUTE, BILATERAL Qualifier: Diagnosis of  By: [name] MD, [name]  Centricity Description: OTITIS MEDIA, SUPPURATIVE, ACUTE Qualifier: Diagnosis of  By: [name] MD, Sean A    Allergic rhinitis    Allergy    Anemia    Anxiety    Arthritis    CAD (coronary artery disease)    LHC (11/29/1999):  EF 60%; no significant CAD.   Carpal tunnel syndrome    Carpal tunnel syndrome 06/09/2006   Qualifier: Diagnosis of  By: Marca Ancona RMA, Lucy     Chronic diastolic CHF (congestive heart failure) ([place])    a. Echo (01/21/11): Vigorous LVF, EF 65-70%, no RWMA, Gr 2 DD. b.  Echo (12/15): Moderate LVH, EF 123456, grade 2 diastolic dysfunction, mild LAE, normal RV function c. 01/2015: echo showing EF of 60-65% with mild LVH   Common migraine    COPD (chronic obstructive pulmonary disease) (HCC)    Diastolic heart failure (HCC)    Diverticulitis    GERD (gastroesophageal reflux disease)    Hepatic steatosis    Hepatomegaly    Hiatal hernia    History of cardiomegaly    Hyperlipidemia    Hypertension    IBS (irritable bowel syndrome)    Internal hemorrhoids    Metabolic syndrome    Morbid obesity (H[facility])    OSA (obstructive sleep apnea)  CPAP dependent   PNEUMONIA, ORGAN UNSPECIFIED 03/01/2009   Qualifier: Diagnosis of  By: [name] CMA, Jennifer     Sinusitis, chronic 03/10/2012   CT sinuses 02/2012:  Chronic sinusitis with small A/F levels >> rx by ENT with prolonged augmentin F/u ct sinuses 04/2012:  Persistent sinus thickening >> rx with levaquin and clinda for 30 days.  Told to return for sinus scan in 6 weeks >> no showed for followup visit.  CT sinuses 11/2013:  Acute and chronic sinusitis noted.     Sleep apnea    bi pap    Patient Active Problem List   Diagnosis Date Noted   Abdominal pain,  chronic, epigastric    Abdominal pain    Gastritis and gastroduodenitis    Bronchitis 02/12/2018   Polyp of sigmoid colon    CKD (chronic kidney disease) stage 3, GFR 30-59 ml/min 07/08/2016   Cough variant asthma 07/31/2015   Upper airway cough syndrome 04/11/2015   Bronchitis, chronic obstructive w acute bronchitis ([place]) 04/11/2015   Dyspnea    Acute on chronic diastolic CHF (congestive heart failure), NYHA class 2 ([place]) 03/27/2015   Morbid obesity due to excess calories ([place]) 03/27/2015   Dysphagia 03/27/2015   Hyponatremia 03/27/2015   Acute kidney injury ([place]) 03/27/2015   Hypokalemia 03/27/2015   Leukocytosis 03/27/2015   COPD exacerbation ([place]) 03/27/2015   Sinusitis, chronic 03/10/2012   Hyperlipidemia 10/02/2006   Obstructive sleep apnea 06/09/2006   Essential hypertension 06/09/2006    Past Surgical History:  Procedure Laterality Date   ABDOMINAL HYSTERECTOMY     BIOPSY  06/22/2018   Procedure: BIOPSY;  Surgeon: Mauri Pole, MD;  Location: [facility];  Service: Endoscopy;;   BIOPSY  07/28/2018   Procedure: BIOPSY;  Surgeon: Mauri Pole, MD;  Location: [facility];  Service: Endoscopy;;   CESAREAN SECTION     COLONOSCOPY     COLONOSCOPY WITH PROPOFOL N/A 05/05/2017   Procedure: COLONOSCOPY WITH PROPOFOL;  Surgeon: Mauri Pole, MD;  Location: [facility];  Service: Endoscopy;  Laterality: N/A;   ESOPHAGOGASTRODUODENOSCOPY (EGD) WITH PROPOFOL N/A 06/22/2018   Procedure: ESOPHAGOGASTRODUODENOSCOPY (EGD) WITH PROPOFOL;  Surgeon: Mauri Pole, MD;  Location: [facility];  Service: Endoscopy;  Laterality: N/A;   ESOPHAGOGASTRODUODENOSCOPY (EGD) WITH PROPOFOL N/A 07/28/2018   Procedure: ESOPHAGOGASTRODUODENOSCOPY (EGD) WITH PROPOFOL;  Surgeon: Mauri Pole, MD;  Location: [facility];  Service: Endoscopy;  Laterality: N/A;   NASAL SINUS SURGERY     PARTIAL HYSTERECTOMY     TONSILLECTOMY     TUBAL LIGATION      UVULOPALATOPHARYNGOPLASTY       OB History   No obstetric history on file.      Home Medications    Prior to Admission medications   Medication Sig Start Date End Date Taking? Authorizing Provider  albuterol (PROVENTIL) (2.5 MG/3ML) 0.083% nebulizer solution Take 3 mLs (2.5 mg total) by nebulization every 4 (four) hours as needed for wheezing or shortness of breath. 05/01/17   Tanda Rockers, MD  amoxicillin-clavulanate (AUGMENTIN) 875-125 MG tablet Take 1 tablet by mouth every 12 (twelve) hours. 10/26/18   Kinnie Feil, PA-C  aspirin 81 MG EC tablet TAKE 1 TABLET BY MOUTH EVERY DAY Patient taking differently: Take 81 mg by mouth daily.  04/22/18   Skeet Latch, MD  azithromycin (ZITHROMAX) 250 MG tablet Take 2 pills today then one a day for 4 additional days. DX: J40 10/21/18   Magdalen Spatz, NP  benzonatate (TESSALON) 200  MG capsule Take 1 capsule (200 mg total) by mouth 3 (three) times daily as needed for cough. Patient taking differently: Take 200 mg by mouth 2 (two) times daily as needed for cough.  10/13/17   Chesley Mires, MD  carvedilol (COREG) 12.5 MG tablet TAKE 1 TABLET BY MOUTH TWICE A DAY WITH FOOD Patient taking differently: Take 12.5 mg by mouth 2 (two) times daily with a meal.  07/13/18   Skeet Latch, MD  dexlansoprazole (DEXILANT) 60 MG capsule Take 1 capsule (60 mg total) by mouth every morning. Take 30 minutes before breakast 05/12/18   Mauri Pole, MD  dicyclomine (BENTYL) 10 MG capsule Take 1 capsule (10 mg total) by mouth 3 (three) times daily before meals for 5 days. Patient taking differently: Take 10 mg by mouth 4 (four) times daily -  before meals and at bedtime.  06/02/18 07/20/18  Duffy Bruce, MD  doxycycline (VIBRAMYCIN) 100 MG capsule Take 1 capsule (100 mg total) by mouth 2 (two) times daily. 10/26/18   Kinnie Feil, PA-C  fluticasone (FLONASE) 50 MCG/ACT nasal spray SPRAY 2 SPRAYS INTO EACH NOSTRIL EVERY DAY 08/12/18   Chesley Mires, MD  Guaifenesin [facility] MAXIMUM STRENGTH) 1200 MG TB12 Take 1,200 mg by mouth daily.    [provider]  hydrALAZINE (APRESOLINE) 25 MG tablet TAKE 1 TABLET BY MOUTH THREE TIMES A DAY Patient taking differently: Take 25 mg by mouth 3 (three) times daily.  05/11/18   Skeet Latch, MD  isosorbide dinitrate (ISORDIL) 20 MG tablet Take 1 tablet (20 mg total) by mouth 3 (three) times daily. 02/20/18   Skeet Latch, MD  levocetirizine (XYZAL) 5 MG tablet Take 5 mg by mouth every evening.    [provider]  loperamide (IMODIUM) 2 MG capsule Take 1 capsule (2 mg total) by mouth 4 (four) times daily as needed for diarrhea or loose stools. 06/02/18   Duffy Bruce, MD  losartan (COZAAR) 25 MG tablet TAKE 2 TABLETS BY MOUTH EVERY DAY 10/05/18   Skeet Latch, MD  losartan (COZAAR) 50 MG tablet TAKE 1 TABLET BY MOUTH EVERY DAY Patient taking differently: Take 50 mg by mouth daily.  12/01/17   Skeet Latch, MD  lubiprostone (AMITIZA) 24 MCG capsule Take 1 capsule (24 mcg total) by mouth 2 (two) times daily with a meal. 09/15/18   Nandigam, Venia Minks, MD  metolazone (ZAROXOLYN) 2.5 MG tablet Take 2.5 mg by mouth daily as needed (FOR OVERNIGHT WEIGHT GAIN OF 3 lbs. or more).    [provider]  montelukast (SINGULAIR) 10 MG tablet TAKE 1 TABLET(10 MG) BY MOUTH AT BEDTIME Patient taking differently: Take 10 mg by mouth at bedtime.  07/25/17   Tanda Rockers, MD  ondansetron (ZOFRAN) 4 MG tablet Take 1 tablet (4 mg total) by mouth every 8 (eight) hours as needed for nausea or vomiting. 03/30/18   Harris, Vernie Shanks, PA-C  polyethylene glycol powder (GLYCOLAX/MIRALAX) powder Use 2 capfuls in the morning and 1 capful in the evening and as directed Patient taking differently: Take 17-34 g by mouth daily as needed for moderate constipation.  03/24/17   Mauri Pole, MD  Potassium Chloride ER 20 MEQ TBCR TAKE 3 TABLETS BY MOUTH 3 TIMES A DAY 09/30/18   Skeet Latch,  MD  predniSONE (DELTASONE) 10 MG tablet Take 4 tabs for 2 days, then 3 tabs for 2 days, 2 tabs for 2 days, then 1 tab for 2 days, then stop. 10/21/18   Groce,  Lars Masson, NP  promethazine (PHENERGAN) 25 MG tablet Take 1 tablet (25 mg total) by mouth every 6 (six) hours as needed for nausea or vomiting. 10/26/18   Kinnie Feil, PA-C  Respiratory Therapy Supplies (FLUTTER) DEVI 1 application by Does not apply route as directed. 12/27/16   Tanda Rockers, MD  spironolactone (ALDACTONE) 25 MG tablet TAKE 1/2 TABLET BY MOUTH TWICE A DAY Patient taking differently: Take 12.5 mg by mouth 2 (two) times daily.  12/02/17   Skeet Latch, MD  sucralfate (CARAFATE) 1 g tablet Take 1 tablet (1 g total) by mouth 4 (four) times daily -  with meals and at bedtime. 09/21/18   Mauri Pole, MD  topiramate (TOPAMAX) 25 MG tablet Take 25 mg by mouth 2 (two) times daily as needed (for migraines).     [provider]  torsemide (DEMADEX) 20 MG tablet TAKE 2 TABLETS(40 MG) BY MOUTH TWICE DAILY Patient taking differently: Take 40 mg by mouth 2 (two) times daily.  06/02/17   Skeet Latch, MD  traMADol (ULTRAM) 50 MG tablet Take 1 tablet (50 mg total) by mouth every 6 (six) hours as needed. Patient taking differently: Take 50 mg by mouth every 6 (six) hours as needed for moderate pain.  03/30/18   Harris, Abigail, PA-C  potassium chloride SA (K-DUR,KLOR-CON) 20 MEQ tablet TAKE 3 TABLETS (60 MEQ) BY MOUTH THREE TIMES DAILY Patient taking differently: Take 180 mEq by mouth daily.  04/27/18 08/10/18  Skeet Latch, MD    Family History Family History  Problem Relation Age of Onset   Heart disease Mother    Heart attack Mother    Hypertension Mother    Coronary artery disease Other        1st degree relatvie <50   Breast cancer Other        aunt- ? paternal or maternal   Asthma Sister    Colon polyps Sister    Stroke Neg Hx    Colon cancer Neg Hx    Esophageal cancer Neg Hx     Rectal cancer Neg Hx    Stomach cancer Neg Hx     Social History Social History   Tobacco Use   Smoking status: Never Smoker   Smokeless tobacco: Never Used  Substance Use Topics   Alcohol use: No   Drug use: No     Allergies   Sulfonamide derivatives, Doxycycline, Latex, Ciprofloxacin, Fish allergy, Hydrocortisone, and Neomycin   Review of Systems Review of Systems  Constitutional: Positive for chills, diaphoresis, fatigue and fever.  Respiratory: Positive for cough and shortness of breath.   Cardiovascular: Positive for chest pain.  Gastrointestinal: Positive for abdominal pain, diarrhea, nausea and vomiting.  Neurological: Positive for light-headedness.  All other systems reviewed and are negative.    Physical Exam Updated Vital Signs BP (!) 152/113    Pulse (!) 58    Temp 98.2 F (36.8 C) (Oral)    Resp 19    SpO2 97%   Physical Exam Vitals signs and nursing note reviewed.  Constitutional:      Appearance: She is well-developed.     Comments: Morbidly obese, nontoxic.  On her phone.  HENT:     Head: Normocephalic and atraumatic.     Nose: Nose normal.     Mouth/Throat:     Comments: Moist mucous membranes.  Limited view of oropharynx and tonsils due to large tongue. Eyes:     Conjunctiva/sclera: Conjunctivae normal.  Neck:  Musculoskeletal: Normal range of motion.  Cardiovascular:     Rate and Rhythm: Normal rate and regular rhythm.     Comments: 1+ radial and DP pulses bilaterally.  No lower extremity edema.  No calf tenderness. Pulmonary:     Effort: Pulmonary effort is normal.     Breath sounds: Normal breath sounds.     Comments: Speaking in full sentences.  SPO2 at 100% during encounter.  Lungs clear without wheezing, crackles or rhonchi in the upper and middle lobes.  Diminished lung entry to the lower lobes anteriorly, difficult exam due to body habitus. Abdominal:     General: Bowel sounds are normal.     Palpations: Abdomen is soft.      Tenderness: There is no abdominal tenderness.     Comments: Obese. Non tender.   Musculoskeletal: Normal range of motion.  Skin:    General: Skin is warm and dry.     Capillary Refill: Capillary refill takes less than 2 seconds.  Neurological:     Mental Status: She is alert.  Psychiatric:        Behavior: Behavior normal.      ED Treatments / Results  Labs (all labs ordered are listed, but only abnormal results are displayed) Labs Reviewed  COMPREHENSIVE METABOLIC PANEL - Abnormal; Notable for the following components:      Result Value   CO2 19 (*)    Glucose, Bld 112 (*)    BUN 32 (*)    Creatinine, Ser 1.07 (*)    Calcium 8.8 (*)    Total Protein 8.3 (*)    GFR calc non Af Amer 57 (*)    All other components within normal limits  CULTURE, BLOOD (ROUTINE X 2)  CULTURE, BLOOD (ROUTINE X 2)  LACTIC ACID, PLASMA  CBC WITH DIFFERENTIAL/PLATELET  LACTIC ACID, PLASMA    EKG EKG Interpretation  Date/Time:  Monday October 26 2018 11:15:27 EDT Ventricular Rate:  56 PR Interval:    QRS Duration: 98 QT Interval:  422 QTC Calculation: 408 R Axis:   80 Text Interpretation:  Sinus or ectopic atrial rhythm Atrial premature complex Short PR interval Low voltage, extremity and precordial leads Artifact poor study, but grossly unremarkable Confirmed by Carmin Muskrat [phone]) on 10/26/2018 11:47:45 AM   Radiology Dg Chest Portable 1 View  Result Date: 10/26/2018 CLINICAL DATA:  Left-sided chest pain. EXAM: PORTABLE CHEST 1 VIEW COMPARISON:  Jun 05, 2018 FINDINGS: Suspected infiltrate in the right base.  No other interval changes. IMPRESSION: Suspected infiltrate in the right base. A PA and lateral chest x-ray could better evaluate. Otherwise, recommend short-term follow-up imaging after treatment to ensure resolution. Electronically Signed   By: Dorise Bullion III M.D   On: 10/26/2018 13:02    Procedures Procedures (including critical care time)  Medications Ordered in  ED Medications - No data to display   Initial Impression / Assessment and Plan / ED Course  I have reviewed the triage vital signs and the nursing notes.  Pertinent labs & imaging results that were available during my care of the patient were reviewed by me and considered in my medical decision making (see chart for details).  Clinical Course as of Oct 26 1414  Mon Oct 26, 2018  1319 "Ambulated patient in room with pulse ox for 3 - 4 minutes. During ambulation, lowest 02sat noted was 94% on RA. RR ranged from 20 to low 30s. Pts gait was steady, just slow. Generalized weakness. No other issues observed.  Pt back in bed resting and o2 at 99%RA. RN made aware. "   [CG]  1319 IMPRESSION: Suspected infiltrate in the right base. A PA and lateral chest x-ray could better evaluate. Otherwise, recommend short-term follow-up imaging after treatment to ensure resolution.  DG Chest Portable 1 View [CG]  [id number] Pt re-evaluated. No clinical decline.  SpO2 here has been >98%.  Discussed work up and plan to discharge. Pt is concerned about the infection in her lungs. She does not want to go home and have to come back to the hospital.  Reassured her that thus far her ER work up is reassuring however any illness can progress and worsen.  No indication for admission explained to her.  She has good fu with pulmonology.  I will message pulm team to facilitate in close f/u.  Plan of care and disposition discussed with EDP Vanita Panda who agrees.   [CG]    Clinical Course User Index [CG] Kinnie Feil, PA-C    Highest on ddx is COVID-19 LRI vs COVID PNA vs superimposed bacterial PNA/bronchitis.  Given his other known comorbidities also considered COPD/asthma exacerbation contributing.  EMR reviewed. Last cardiology appointment earlier this year. EF 65-70%. No signs of hypervolemia today and CHF decompensation considered but very unlikely given other infectious symptomatology.  PE unlikely to cause fever, vomiting,  diarrhea, cough.   ER work up reviewed by me vastly reassuring. No leukocytosis or leukopenia. No lactic acidosis. Normal hgb.  EKG without ischemic changes, arrhythmias.  CXR reviewed by me and EDP independently as well as radiology.  Lower lobes with diffuse opacities likely COVID related.  Per radiology concern for RLL opacity.   Work up today most suggestive of COVID illness related SOB/CP vs early superimposed bacteria PNA. COPD, asthma, morbid obesity and central obesity likely contributing as well.    Overall work up reassuring however and I don't see indication for further emergent work up or admission. She ambulated for 3-4 min with spos >95%. Overall well appearing.  CURB 65 score low.   Discussed with patient plan to dc. She is worried about worsening illness and having to come back. This is technically possible but reassured her work up is benign and strict return precautions discussed with her.  I will send pulmonologist message to facilitate close check up to ensure no clinical decline.  Patient discussed with EDP.  Final Clinical Impressions(s) / ED Diagnoses   Final diagnoses:  Community acquired pneumonia of right lower lobe of lung  COVID-19 with multiple comorbidities    ED Discharge Orders         Ordered    amoxicillin-clavulanate (AUGMENTIN) 875-125 MG tablet  Every 12 hours     10/26/18 1416    doxycycline (VIBRAMYCIN) 100 MG capsule  2 times daily     10/26/18 1416    promethazine (PHENERGAN) 25 MG tablet  Every 6 hours PRN     10/26/18 1416           Kinnie Feil, PA-C 10/26/18 1416    Carmin Muskrat, MD 10/28/18 [phone]

## 2018-10-26 NOTE — Telephone Encounter (Signed)
Returned call to patient and advised she needs to report to emergency room if sob is worsening. She tested positive for covid on 9/27 and says she has increased sob/chest pain. Pt says she does not have transportation  Advised to call [facility] aware she would be coming and that she tested positive for Covid. Nothing further needed.   Virtual visit 10/21/18  Assessment and Plan: New COVID diagnosis ( Done at [facility] we cannot see the results) Swabbed 9/27, Called with + 9/28 Change in color of secretions to light green  and new onset wheezing She is quarantining in her bedroom  She has family checking on her daily.  Plan We will prescribe a prednisone taper . Prednisone taper; 10 mg tablets: 4 tabs x 2 days, 3 tabs x 2 days, 2 tabs x 2 days 1 tab x 2 days then stop. We will prescribe a z pack today. Take this as directed Continue Albuterol nebs as you have been doing for your wheezing.  Aggressive Pulmonary Toilet Increase flutter valve use 2-3 times daily 10 breaths each time Continue to monitor temperatures Take Tylenol as directed for symptomatic relief Go to the ER for worsening in breathing that does not improve with albuterol nebs. Please contact office for sooner follow up if symptoms do not improve or worsen or seek emergency care

## 2018-10-27 ENCOUNTER — Telehealth: Payer: Self-pay | Admitting: Acute Care

## 2018-10-27 NOTE — Telephone Encounter (Signed)
Spoke with pt. Her appointment tomorrow has been switched to a televisit with [name] at 0900. 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 Nothing further was needed.

## 2018-10-28 ENCOUNTER — Other Ambulatory Visit: Payer: Self-pay

## 2018-10-28 ENCOUNTER — Ambulatory Visit (INDEPENDENT_AMBULATORY_CARE_PROVIDER_SITE_OTHER): Payer: 59 | Admitting: Acute Care

## 2018-10-28 ENCOUNTER — Encounter: Payer: Self-pay | Admitting: Acute Care

## 2018-10-28 DIAGNOSIS — U071 COVID-19: Secondary | ICD-10-CM | POA: Diagnosis not present

## 2018-10-28 DIAGNOSIS — J1282 Pneumonia due to coronavirus disease 2019: Secondary | ICD-10-CM

## 2018-10-28 DIAGNOSIS — J1289 Other viral pneumonia: Secondary | ICD-10-CM

## 2018-10-28 DIAGNOSIS — R197 Diarrhea, unspecified: Secondary | ICD-10-CM | POA: Diagnosis not present

## 2018-10-28 DIAGNOSIS — R110 Nausea: Secondary | ICD-10-CM

## 2018-10-28 DIAGNOSIS — G4733 Obstructive sleep apnea (adult) (pediatric): Secondary | ICD-10-CM | POA: Diagnosis not present

## 2018-10-28 NOTE — Progress Notes (Signed)
Virtual Visit via Telephone Note  I connected with [name] on 10/28/18 at  9:00 AM EDT by telephone and verified that I am speaking with the correct person using two identifiers.  Location: Patient: At home Provider: At the office at [employer], [address]   I discussed the limitations, risks, security and privacy concerns of performing an evaluation and management service by telephone and the availability of in person appointments. I also discussed with the patient that there may be a patient responsible charge related to this service. The patient expressed understanding and agreed to proceed.  Synopsis [id number] smokerwith history of chronic cough, COPD/asthma with recurrent severe cough " since her 40's", OSA on BiPAP, morbid obesity, chronic diastolic heart failure EF 65 to 70%, hypertension, hyperlipidemia IBS with constipation, ulcers, GERD, gastroparesisShe recently tested + 10/18/2018  for COVID. She was told by her PCP to call Pulmonary if she had any change in color of secretions. She states her secretions have changed to green and she called requesting something be sent in. We are seeing her as a telephone visit prior to prescribing any treatments. She is followed by Dr. [name], and  Dr. [name]   ED Visit 10/26/2018   History of Present Illness: Pt. Presents for follow up. She was diagnosed with COVID 19 Sunday 9/27. She was treated with z pack and prednisone taper. She states she completed this treatment. She states she started to have chest pain and shortness of breath, so she went to the ER for evaluation on 10/5.For the prior  week she had had recurrent fevers as high as 101 that resolved with Tylenol but come back when the Tylenol wears off.  She has associated congestion, nausea, vomiting, lightheadedness, hot sweats, generalized malaise and fatigue, diarrhea, abdominal pain, cough productive of white thick pastelike sputum, shortness of breath.  She describes the  shortness of breath as "when I take a deep breath it feels like someone is cutting my airway off".  She feels tired when she tries to sit up and starts to move but denies any chest pain.  She had a televisit with pulmonary  9/30 and was  prescribed a Z-Pak and prednisone which she finished 10/4.  She has been taking the cough medicine and using her breathing treatments every 4 hours.  Over the 24 prior to the ED visit she felt worse.  She had sharp pains constantly but significantly worse when she wears her CPAP machine and when she does breathing treatments.  She called our office and was  advised her to come to the ER due to her report of worsening shortness of breath and chest pains.  Her son tested positive for COVID as well. CXR in the ED  showed a suspected infiltrate. She was sent home with Doxycycline and Amoxicillin for treatment. She has taken these medications for 3 days, and she states she has been having nausea and diarrhea, in addition to stomach pains. .She states she was also prescribed phenergan for nausea, but that it is not helping. She has a history of stomach ulcers. She is currently being treated by GI. She is on maintenance medications that include Dexilant and Carafate. She states she has been compliant with both and she continues to have nausea and diarrhea. She is not taking a probiotic. She states her breathing is better and her chest pain has gone. She states her fever was 96.4 all day 10/6, and she has not checked it today due to  stomach discomfort. She denies orthopnea or hemoptysis. She states the end of her quarantine date was 10/6 per the [facility].  Discussion Pt has completed a z pack and pred taper 10/4, and was then started on Amoxicillin and Doxycycline 10/5  for CAP by the ED. [name] She has a history of gastric ulcers and has been having diarrhea and nausea and vomiting on the antibiotic therapy which is becoming problematic. ( of note she does  has less dyspnea and chest  pain so she has had clinical improvement). We will stop Doxycycline, as she has ADR noted , and complete treatment with Amoxicillin for the full 7 days. Start Probiotic and BRAT diet with imodium as needed for stomach cramping and diarrhea. Pt will follow up in 2 weeks with CXR to ensure she is better with treatment. She verbalized understanding of the above. After Visit Summery was sent to her home address for reinforcement of treatment plan, and clear indications  for return to ED.    Observations/Objective: 10/26/2018 CXR Suspected infiltrate in the right base.  Recommend short-term follow-up imaging after treatment to ensure resolution.   Assessment and Plan: CAP of the RLL ( 10/26/2018) COVID 19 with multiple co morbidities Treated with Pred taper and Z pack 10/21/2018>> she completed treatment ED visit 10/5>> CAP RLL>> Doxycycline and Amoxicillin initiated Dyspnea is better Now Diarrhea and stomach cramping not improved  with phenergan Plan  Complete Amoxicillin 875-125 every 12 hours per ED instructions  Stop  Doxycycline 100 mg twice daily  Continue Dexilant and Carafate Probiotics once daily ( Culturelle) Imodium 1 tablet for stomach cramping as directed Use as treatment dose for continued diarrhea BRAT diet ( Bananas, Rice,  Applesauce and toast) Drink plenty of fluids Get plenty of rest Tylenol for body aches Phenergan 25 mg every 6 hours prn per ED instructions CXR in 2 weeks to ensure resolution of infiltrate Oct 21 at 1:30 pm with [name] CXR prior If you get worse not better call the office or seek emergency care Please contact office for sooner follow up if symptoms do not improve or worsen or seek emergency care    Follow Up Instructions: Follow up CXR in 2 weeks  Oct 21 at 1:30 pm with [name] She finished quarantining 10/6. Follow up with GI   I discussed the assessment and treatment plan with the patient. The patient was provided an opportunity to ask  questions and all were answered. The patient agreed with the plan and demonstrated an understanding of the instructions.   The patient was advised to call back or seek an in-person evaluation if the symptoms worsen or if the condition fails to improve as anticipated.  I provided  30 minutes of non-face-to-face time during this encounter.   Magdalen Spatz, NP 12:03 PM

## 2018-10-28 NOTE — Patient Instructions (Signed)
It is good to talk with you today Complete Amoxicillin 875-125 every 12 hours per ED instructions  Stop  Doxycycline 100 mg twice daily  Continue Dexilant and Carafate Add Probiotics once daily ( Culturelle) Try Imodium 1 tablet for stomach cramping as directed BRAT diet ( Bananas, Rice,  Applesauce and toast) for a few days until you feel better Drink plenty of fluids Get plenty of rest Tylenol for body aches as directed on bottle Phenergan 25 mg every 6 hours prn per ED instructions CXR in 2 weeks to ensure resolution of infiltrate Follow up Wednesday Oct 21 at 1:30 pm with [name] CXR prior If you get worse not better call the office or seek emergency care Please contact office for sooner follow up if symptoms do not improve or worsen or seek emergency care

## 2018-10-28 NOTE — Progress Notes (Signed)
Chart and office note reviewed in detail  > agree with a/p as outlined    

## 2018-10-28 NOTE — Progress Notes (Signed)
Reviewed and agree with assessment/plan.   Isiaih Hollenbach, MD [facility] 01/17/2016, 12:24 PM Pager:  [phone]  

## 2018-10-31 LAB — CULTURE, BLOOD (ROUTINE X 2)
Culture: NO GROWTH
Special Requests: ADEQUATE

## 2018-11-02 ENCOUNTER — Telehealth: Payer: Self-pay | Admitting: Internal Medicine

## 2018-11-02 NOTE — Telephone Encounter (Signed)
Spoke with the pt  She wanted to let [name] know that she is still coughing some  She coughed up green sputum yesterday, but it was clear this morning  Temps have been 98.8-99.0 in the evenings  She states body aches are better but still not completely resolved  Last day of her amoxicillin is today  She is scheduled for 11/11/18 with [name] with cxr  Please advise if any further recs in the meantime, thanks

## 2018-11-02 NOTE — Telephone Encounter (Signed)
No new recommendations.. Thanks so much.We will see her 10/21. Have her contact us  she has any changes.

## 2018-11-02 NOTE — Telephone Encounter (Signed)
Spoke with the pt and notified of recs per SG She verbalized understanding  Nothing further needed

## 2018-11-11 ENCOUNTER — Ambulatory Visit (INDEPENDENT_AMBULATORY_CARE_PROVIDER_SITE_OTHER): Payer: 59

## 2018-11-11 ENCOUNTER — Ambulatory Visit (INDEPENDENT_AMBULATORY_CARE_PROVIDER_SITE_OTHER): Payer: 59 | Admitting: Acute Care

## 2018-11-11 ENCOUNTER — Other Ambulatory Visit: Payer: Self-pay

## 2018-11-11 ENCOUNTER — Encounter: Payer: Self-pay | Admitting: Acute Care

## 2018-11-11 VITALS — BP 118/66 | HR 65 | Temp 98.1°F | Ht 63.0 in | Wt 277.0 lb

## 2018-11-11 DIAGNOSIS — J441 Chronic obstructive pulmonary disease with (acute) exacerbation: Secondary | ICD-10-CM | POA: Diagnosis not present

## 2018-11-11 DIAGNOSIS — Z87891 Personal history of nicotine dependence: Secondary | ICD-10-CM | POA: Diagnosis not present

## 2018-11-11 DIAGNOSIS — J1289 Other viral pneumonia: Secondary | ICD-10-CM

## 2018-11-11 DIAGNOSIS — U071 COVID-19: Secondary | ICD-10-CM

## 2018-11-11 DIAGNOSIS — Z23 Encounter for immunization: Secondary | ICD-10-CM

## 2018-11-11 DIAGNOSIS — G4733 Obstructive sleep apnea (adult) (pediatric): Secondary | ICD-10-CM

## 2018-11-11 DIAGNOSIS — J189 Pneumonia, unspecified organism: Secondary | ICD-10-CM | POA: Insufficient documentation

## 2018-11-11 DIAGNOSIS — J1282 Pneumonia due to coronavirus disease 2019: Secondary | ICD-10-CM

## 2018-11-11 LAB — CBC WITH DIFFERENTIAL/PLATELET
Basophils Absolute: 0 10*3/uL (ref 0.0–0.1)
Basophils Relative: 0.3 % (ref 0.0–3.0)
Eosinophils Absolute: 0.4 10*3/uL (ref 0.0–0.7)
Eosinophils Relative: 3.4 % (ref 0.0–5.0)
HCT: 35.8 % — ABNORMAL LOW (ref 36.0–46.0)
Hemoglobin: 11.6 g/dL — ABNORMAL LOW (ref 12.0–15.0)
Lymphocytes Relative: 28.2 % (ref 12.0–46.0)
Lymphs Abs: 3.3 10*3/uL (ref 0.7–4.0)
MCHC: 32.5 g/dL (ref 30.0–36.0)
MCV: 91.6 fl (ref 78.0–100.0)
Monocytes Absolute: 1 10*3/uL (ref 0.1–1.0)
Monocytes Relative: 8.8 % (ref 3.0–12.0)
Neutro Abs: 6.9 10*3/uL (ref 1.4–7.7)
Neutrophils Relative %: 59.3 % (ref 43.0–77.0)
Platelets: 241 10*3/uL (ref 150.0–400.0)
RBC: 3.91 Mil/uL (ref 3.87–5.11)
RDW: 14.3 % (ref 11.5–15.5)
WBC: 11.7 10*3/uL — ABNORMAL HIGH (ref 4.0–10.5)

## 2018-11-11 LAB — BASIC METABOLIC PANEL
BUN: 27 mg/dL — ABNORMAL HIGH (ref 6–23)
CO2: 29 mEq/L (ref 19–32)
Calcium: 9.7 mg/dL (ref 8.4–10.5)
Chloride: 100 mEq/L (ref 96–112)
Creatinine, Ser: 0.94 mg/dL (ref 0.40–1.20)
GFR: 73.51 mL/min (ref >60.00)
Glucose, Bld: 93 mg/dL (ref 70–99)
Potassium: 4.1 mEq/L (ref 3.5–5.1)
Sodium: 137 mEq/L (ref 135–145)

## 2018-11-11 NOTE — Assessment & Plan Note (Signed)
Reports Compliance Plan   Continue on CPAP at bedtime. You appear to be benefiting from the treatment  Goal is to wear for at least 6 hours each night for maximal clinical benefit. Continue to work on weight loss, as the link between excess weight  and sleep apnea is well established.   Remember to establish a good bedtime routine, and work on sleep hygiene.  Limit daytime naps , avoid stimulants such as caffeine and nicotine close to bedtime, exercise daily to promote sleep quality, avoid heavy , spicy, fried , or rich foods before bed. Ensure adequate exposure to natural light during the day,establish a relaxing bedtime routine with a pleasant sleep environment ( Bedroom between 60 and 67 degrees, turn off bright lights , TV or device screens screens , consider black out curtains or white noise machines) Do not drive if sleepy. Remember to clean mask, tubing, filter, and reservoir once weekly with soapy water.  Follow up with Dr. [name] or [name]   In 6 months  or before as needed.

## 2018-11-11 NOTE — Patient Instructions (Addendum)
It is good to see you today.  We will place an order for follow up Covid testing to make sure you are negative. You will get a call to schedule time and place. Please make sure all family members wear a mask while outside with their friends.  All family members need to wear a mask while around you.  They need to understand that you have an underlying lung disease that makes you more vulnerable to COVID 19. CXR today >> shows almost resolved pneumonia CBC with diff today BMET today Mucinex 1200 mg twice daily with a full glass of water. Flutter valve 4 puffs 3-4 times a day Continue your albuterol nebs as needed for shortness of breath or wheezing Continue Dexilant and Carafate Probiotics once daily ( Culturelle) Imodium 1 tablet for stomach cramping as directed as needed Drink plenty of fluids Get plenty of rest Tylenol for body aches and fever Follow up CXR in 2 weeks ( Here at the office) We will call you with the results Follow up appointment with [name] in 3 months If you get worse not better call the office or seek emergency care Please contact office for sooner follow up if symptoms do not improve or worsen or seek emergency care  Flu shot today    Continue on CPAP at bedtime. You appear to be benefiting from the treatment  Goal is to wear for at least 6 hours each night for maximal clinical benefit. Continue to work on weight loss, as the link between excess weight  and sleep apnea is well established.   Remember to establish a good bedtime routine, and work on sleep hygiene.  Limit daytime naps , avoid stimulants such as caffeine and nicotine close to bedtime, exercise daily to promote sleep quality, avoid heavy , spicy, fried , or rich foods before bed. Ensure adequate exposure to natural light during the day,establish a relaxing bedtime routine with a pleasant sleep environment ( Bedroom between 60 and 67 degrees, turn off bright lights , TV or device screens screens ,  consider black out curtains or white noise machines) Do not drive if sleepy. Remember to clean mask, tubing, filter, and reservoir once weekly with soapy water.  Follow up with Dr. [name] or [name]  In 6 months or before as needed.

## 2018-11-11 NOTE — Assessment & Plan Note (Signed)
COVID 19 with multiple co morbidities diagnosed 10/18/2018 Treated with Pred taper and Z pack 10/21/2018>> she completed treatment ED visit 10/5>> CAP RLL>> Doxycycline and Amoxicillin initiated Dyspnea is better Now Diarrhea and stomach cramping not improved  with phenergan Stopped Doxycycline 10/7 and continued Amoxicillin Called office 10/12 with continued cough and secretions CXR 10/21 shows near resolution of infiltrate in right middle and lower lobe Plan: CXR today  CBC with diff today BMET today Mucinex 1200 mg twice daily with a full glass of water. Flutter valve 4 puffs 3-4 times a day Continue your albuterol nebs as needed for shortness of breath or wheezing Continue Dexilant and Carafate Probiotics once daily ( Culturelle) Imodium 1 tablet for stomach cramping as directed as needed Drink plenty of fluids Get plenty of rest Tylenol for body aches and fever Follow up CXR in 2 weeks  We will call you with the results If you get worse not better call the office or seek emergency care Please contact office for sooner follow up if symptoms do not improve or worsen or seek emergency care  Flu shot today

## 2018-11-11 NOTE — Progress Notes (Signed)
History of Present Illness:   History of Present Illness [name] is a 58 y.o. female never smoker with history of chronic cough, COPD/asthma with recurrent severe cough " since her 7's", OSA on BiPAP, morbid obesity, chronic diastolic heart failure EF 65 to 70%, hypertension, hyperlipidemia IBS with constipation, ulcers, GERD, gastroparesis. She is followed by Dr. [name] and Dr. [name].   Synopsis [id number] smokerwith history of chronic cough, COPD/asthma with recurrent severe cough " since her 40's", OSA on BiPAP, morbid obesity, chronic diastolic heart failure EF 65 to 70%, hypertension, hyperlipidemia IBS with constipation, ulcers, GERD, gastroparesisShe recently tested + 10/18/2018  for COVID. She was told by her PCP to call Pulmonary if she had any change in color of secretions. She states her secretions have changed to green and she called requesting something be sent in. We are seeing her as a telephone visit prior to prescribing any treatments.She is followed by Dr. [name], and Dr. [name]   ED Visit 10/26/2018  11/11/2018 Follow Up Pt. Presents for 2 week follow up. She was initially diagnosed with COVID 19 Sunday 9/27. She was seen virtually by me 9/30 with complaints of  change in color of secretions from clear to green and wheezing. She was treated with z pack and prednisone taper which she completed 10/4.She then  started to have chest pain and worsening shortness of breath, so she went to the ER for evaluation on 10/5.For the week prior she had had endorsed  recurrent fevers as high as 101 that resolved with Tylenol but come back when the Tylenol wore off. She has associated congestion, nausea, vomiting, lightheadedness, hot sweats, generalized malaise and fatigue, diarrhea, abdominal pain,cough productive of white thick pastelike sputum, shortness of breath. She describes the shortness of breath as "when I take a deep breath it feels like someone is cutting my airway  off". She feels tired when she tries to sit up and starts to move but denies any chest pain.  She has been taking the cough medicine and using her breathing treatments every 4 hours. Over the 24 prior to the ED visit she felt worse. She had sharp pains constantly but significantly worse when she wore  her CPAP machine and when she did breathing treatments. She called our office and was  advised her to come to the ER due to her report of worsening shortness of breath and chest pains.Her son tested positive for COVID as well. CXR in the ED  showed a suspected infiltrate. She was sent home with Doxycycline and Amoxicillin for treatment. She had taken these medications for 3 days, and then developed  nausea and diarrhea, in addition to stomach pains. .She states she was also prescribed phenergan for nausea, but that did not help her symptoms. She has a history of stomach ulcers. She is currently being treated by GI. She is on maintenance medications that include Dexilant and Carafate. She states she has been compliant with both and she continues to have nausea and diarrhea. She is not taking a probiotic. She states her breathing is better and her chest pain has gone. She states her fever resolved by  10/6, and she has not checked it today due to stomach discomfort. He last day to quarantine was 10/6. She called the office again 10/7 stating that she could not continue to take both antibiotics as they were making her so sick. We decided to stop the Doxycycline and continue with the Amoxicillin 875/125 for the  full 7 days of treatment. We added a probiotic to protect intestinal  flora, and immodium 1 tablet only for stomach cramping. She had plenty of phenergan. She was to continue her Dexilant and Carafate, drink plenty of fluids, use Tylenol for fever and body aches, and BRAT diet until diarrhea and stomach issues were improved. .  Pt has follow up today with CXR prior.CXR shows near complete resolution of  opacity in right middle and lower lobe.  She states she has completed her antibiotics and she does still have a productive cough. The secretions are clear. She has had full round of azithromycin, and Augmentin. She has had 3 days of treatment with Doxycycline. She has not had fever since 10/6. She feels much better, and her shortness of breath has resolved. She denies any wheezing, chest pain or orthopnea. She states she has been compliant with her BiPAP machine.   Test Results: 11/11/2018 CXR Near complete interval resolution of previously demonstrated heterogeneous airspace opacity of the right midlung and lung base, consistent with improved infection. Cardiomegaly.  10/26/2018 CXR  Suspected infiltrate in the right base.  Recommend short-term follow-up imaging after treatment to ensure resolution.   10/21/2018 temperature 99.1 10/21/2018-weight- 285 pounds 03/11/2018-office spirometry-FVC 2.6 (78% predicted), ratio 80, FEV1 2 (92% predicted), normal spirometry  Down Load 11/11/2018 AirCurve 10 VAuto IPAP 15 cm H2O EPAP 11 cm H2O Usage 30/30 days > 4 hours 29 days < 4 hours 1 day AHI is 1.8 ( Central 0.1 Obstructive 0.5 Unknown 1.0)  CBC Latest Ref Rng & Units 10/26/2018 06/18/2018 06/12/2018  WBC 4.0 - 10.5 K/uL 7.8 9.0 10.0  Hemoglobin 12.0 - 15.0 g/dL 13.2 11.9(L) 12.8  Hematocrit 36.0 - 46.0 % 42.1 36.8 39.4  Platelets 150 - 400 K/uL 238 284 292    BMP Latest Ref Rng & Units 10/26/2018 06/18/2018 06/12/2018  Glucose 70 - 99 mg/dL 112(H) 111(H) 106(H)  BUN 6 - 20 mg/dL 32(H) 11 14  Creatinine 0.44 - 1.00 mg/dL 1.07(H) 0.98 1.00  BUN/Creat Ratio 9 - 23 - - -  Sodium 135 - 145 mmol/L 136 141 137  Potassium 3.5 - 5.1 mmol/L 3.5 4.1 3.8  Chloride 98 - 111 mmol/L 106 104 104  CO2 22 - 32 mmol/L 19(L) 27 26  Calcium 8.9 - 10.3 mg/dL 8.8(L) 9.2 9.5    BNP    Component Value Date/Time   BNP 49.0 06/05/2018 1719    ProBNP    Component Value Date/Time   PROBNP 25.6  04/11/2013 1008    PFT No results found for: FEV1PRE, FEV1POST, FVCPRE, FVCPOST, TLC, DLCOUNC, PREFEV1FVCRT, PSTFEV1FVCRT  Dg Chest 2 View  Result Date: 11/11/2018 CLINICAL DATA:  Follow-up pneumonia EXAM: CHEST - 2 VIEW COMPARISON:  10/25/2013 FINDINGS: Cardiomegaly. Near complete interval resolution of previously demonstrated heterogeneous airspace opacity of the right midlung and lung base. The visualized skeletal structures are unremarkable. IMPRESSION: 1. Near complete interval resolution of previously demonstrated heterogeneous airspace opacity of the right midlung and lung base, consistent with improved infection. 2.  Cardiomegaly. Electronically Signed   By: Eddie Candle M.D.   On: 11/11/2018 13:53   Dg Chest Portable 1 View  Result Date: 10/26/2018 CLINICAL DATA:  Left-sided chest pain. EXAM: PORTABLE CHEST 1 VIEW COMPARISON:  Jun 05, 2018 FINDINGS: Suspected infiltrate in the right base.  No other interval changes. IMPRESSION: Suspected infiltrate in the right base. A PA and lateral chest x-ray could better evaluate. Otherwise, recommend short-term follow-up imaging after treatment to ensure resolution.  Electronically Signed   By: Dorise Bullion III M.D   On: 10/26/2018 13:02     Past medical hx Past Medical History:  Diagnosis Date   Acute on chronic diastolic CHF (congestive heart failure) ([facility]) 04/11/2014   Acute suppurative otitis media without spontaneous rupture of eardrum 02/04/2007   Centricity Description: OTITIS MEDIA, SUPPURATIVE, ACUTE, BILATERAL Qualifier: Diagnosis of  By: [name] MD, [name]  Centricity Description: OTITIS MEDIA, SUPPURATIVE, ACUTE Qualifier: Diagnosis of  By: [name] MD, Sean A    Allergic rhinitis    Allergy    Anemia    Anxiety    Arthritis    CAD (coronary artery disease)    LHC (11/29/1999):  EF 60%; no significant CAD.   Carpal tunnel syndrome    Carpal tunnel syndrome 06/09/2006   Qualifier: Diagnosis of  By: Marca Ancona RMA, Lucy      Chronic diastolic CHF (congestive heart failure) ([facility])    a. Echo (01/21/11): Vigorous LVF, EF 65-70%, no RWMA, Gr 2 DD. b.  Echo (12/15): Moderate LVH, EF 123456, grade 2 diastolic dysfunction, mild LAE, normal RV function c. 01/2015: echo showing EF of 60-65% with mild LVH   Common migraine    COPD (chronic obstructive pulmonary disease) ([facility])    Diastolic heart failure ([facility])    Diverticulitis    GERD (gastroesophageal reflux disease)    Hepatic steatosis    Hepatomegaly    Hiatal hernia    History of cardiomegaly    Hyperlipidemia    Hypertension    IBS (irritable bowel syndrome)    Internal hemorrhoids    Metabolic syndrome    Morbid obesity ([facility])    OSA (obstructive sleep apnea)    CPAP dependent   PNEUMONIA, ORGAN UNSPECIFIED 03/01/2009   Qualifier: Diagnosis of  By: [name] CMA, Jennifer     Sinusitis, chronic 03/10/2012   CT sinuses 02/2012:  Chronic sinusitis with small A/F levels >> rx by ENT with prolonged augmentin F/u ct sinuses 04/2012:  Persistent sinus thickening >> rx with levaquin and clinda for 30 days.  Told to return for sinus scan in 6 weeks >> no showed for followup visit.  CT sinuses 11/2013:  Acute and chronic sinusitis noted.     Sleep apnea    bi pap     Social History   Tobacco Use   Smoking status: Never Smoker   Smokeless tobacco: Never Used  Substance Use Topics   Alcohol use: No   Drug use: No    Ms.Alderete reports that she has never smoked. She has never used smokeless tobacco. She reports that she does not drink alcohol or use drugs.  Tobacco Cessation: Never Smoker  Past surgical hx, Family hx, Social hx all reviewed.  Current Outpatient Medications on File Prior to Visit  Medication Sig   albuterol (PROVENTIL) (2.5 MG/3ML) 0.083% nebulizer solution Take 3 mLs (2.5 mg total) by nebulization every 4 (four) hours as needed for wheezing or shortness of breath.   amoxicillin-clavulanate (AUGMENTIN) 875-125 MG tablet Take  1 tablet by mouth every 12 (twelve) hours.   aspirin 81 MG EC tablet TAKE 1 TABLET BY MOUTH EVERY DAY (Patient taking differently: Take 81 mg by mouth daily. )   azithromycin (ZITHROMAX) 250 MG tablet Take 2 pills today then one a day for 4 additional days. DX: J40   benzonatate (TESSALON) 200 MG capsule Take 1 capsule (200 mg total) by mouth 3 (three) times daily as needed for cough. (Patient taking differently: Take 200  mg by mouth 2 (two) times daily as needed for cough. )   carvedilol (COREG) 12.5 MG tablet TAKE 1 TABLET BY MOUTH TWICE A DAY WITH FOOD (Patient taking differently: Take 12.5 mg by mouth 2 (two) times daily with a meal. )   dexlansoprazole (DEXILANT) 60 MG capsule Take 1 capsule (60 mg total) by mouth every morning. Take 30 minutes before breakast   doxycycline (VIBRAMYCIN) 100 MG capsule Take 1 capsule (100 mg total) by mouth 2 (two) times daily.   fluticasone (FLONASE) 50 MCG/ACT nasal spray SPRAY 2 SPRAYS INTO EACH NOSTRIL EVERY DAY   Guaifenesin (MUCINEX MAXIMUM STRENGTH) 1200 MG TB12 Take 1,200 mg by mouth daily.   hydrALAZINE (APRESOLINE) 25 MG tablet TAKE 1 TABLET BY MOUTH THREE TIMES A DAY (Patient taking differently: Take 25 mg by mouth 3 (three) times daily. )   isosorbide dinitrate (ISORDIL) 20 MG tablet Take 1 tablet (20 mg total) by mouth 3 (three) times daily.   levocetirizine (XYZAL) 5 MG tablet Take 5 mg by mouth every evening.   loperamide (IMODIUM) 2 MG capsule Take 1 capsule (2 mg total) by mouth 4 (four) times daily as needed for diarrhea or loose stools.   losartan (COZAAR) 25 MG tablet TAKE 2 TABLETS BY MOUTH EVERY DAY   losartan (COZAAR) 50 MG tablet TAKE 1 TABLET BY MOUTH EVERY DAY (Patient taking differently: Take 50 mg by mouth daily. )   lubiprostone (AMITIZA) 24 MCG capsule Take 1 capsule (24 mcg total) by mouth 2 (two) times daily with a meal.   metolazone (ZAROXOLYN) 2.5 MG tablet Take 2.5 mg by mouth daily as needed (FOR OVERNIGHT  WEIGHT GAIN OF 3 lbs. or more).   montelukast (SINGULAIR) 10 MG tablet TAKE 1 TABLET(10 MG) BY MOUTH AT BEDTIME (Patient taking differently: Take 10 mg by mouth at bedtime. )   ondansetron (ZOFRAN) 4 MG tablet Take 1 tablet (4 mg total) by mouth every 8 (eight) hours as needed for nausea or vomiting.   polyethylene glycol powder (GLYCOLAX/MIRALAX) powder Use 2 capfuls in the morning and 1 capful in the evening and as directed (Patient taking differently: Take 17-34 g by mouth daily as needed for moderate constipation. )   Potassium Chloride ER 20 MEQ TBCR TAKE 3 TABLETS BY MOUTH 3 TIMES A DAY   promethazine (PHENERGAN) 25 MG tablet Take 1 tablet (25 mg total) by mouth every 6 (six) hours as needed for nausea or vomiting.   Respiratory Therapy Supplies (FLUTTER) DEVI 1 application by Does not apply route as directed.   spironolactone (ALDACTONE) 25 MG tablet TAKE 1/2 TABLET BY MOUTH TWICE A DAY (Patient taking differently: Take 12.5 mg by mouth 2 (two) times daily. )   sucralfate (CARAFATE) 1 g tablet Take 1 tablet (1 g total) by mouth 4 (four) times daily -  with meals and at bedtime.   topiramate (TOPAMAX) 25 MG tablet Take 25 mg by mouth 2 (two) times daily as needed (for migraines).    torsemide (DEMADEX) 20 MG tablet TAKE 2 TABLETS(40 MG) BY MOUTH TWICE DAILY (Patient taking differently: Take 40 mg by mouth 2 (two) times daily. )   traMADol (ULTRAM) 50 MG tablet Take 1 tablet (50 mg total) by mouth every 6 (six) hours as needed. (Patient taking differently: Take 50 mg by mouth every 6 (six) hours as needed for moderate pain. )   dicyclomine (BENTYL) 10 MG capsule Take 1 capsule (10 mg total) by mouth 3 (three) times daily before meals  for 5 days. (Patient taking differently: Take 10 mg by mouth 4 (four) times daily -  before meals and at bedtime. )   [DISCONTINUED] potassium chloride SA (K-DUR,KLOR-CON) 20 MEQ tablet TAKE 3 TABLETS (60 MEQ) BY MOUTH THREE TIMES DAILY (Patient taking  differently: Take 180 mEq by mouth daily. )   No current facility-administered medications on file prior to visit.      Allergies  Allergen Reactions   Sulfonamide Derivatives Anaphylaxis, Swelling, Rash and Other (See Comments)    Swelling tongue and ear    Doxycycline Nausea And Vomiting   Latex Hives   Ciprofloxacin Rash   Fish Allergy Rash and Other (See Comments)    Only reaction to Mackerel.  No other issues with any type of fish or shellfish currently    Hydrocortisone Rash   Neomycin Rash    Review Of Systems:  Constitutional:   No  weight loss, night sweats,  Fevers, chills, fatigue, or  lassitude.  HEENT:   No headaches,  Difficulty swallowing,  Tooth/dental problems, or  Sore throat,                No sneezing, itching, ear ache, nasal congestion, post nasal drip,   CV:  No chest pain,  Orthopnea, PND, swelling in lower extremities, anasarca, dizziness, palpitations, syncope.   GI  No heartburn, indigestion, abdominal pain, nausea, vomiting, diarrhea, change in bowel habits, loss of appetite, bloody stools.   Resp: No shortness of breath with exertion or at rest.  + excess mucus, + productive cough,  No non-productive cough,  No coughing up of blood.  No change in color of mucus.  No wheezing.  No chest wall deformity  Skin: no rash or lesions.  GU: no dysuria, change in color of urine, no urgency or frequency.  No flank pain, no hematuria   MS:  No joint pain or swelling.  No decreased range of motion.  No back pain.  Psych:  No change in mood or affect. No depression or anxiety.  No memory loss.   Vital Signs BP 118/66 (BP Location: Right Arm, Cuff Size: Large)    Pulse 65    Temp 98.1 F (36.7 C) (Temporal)    Ht 5\' 3"  (1.6 m)    Wt 277 lb (125.6 kg)    SpO2 99%    BMI 49.07 kg/m    Physical Exam:  General- No distress,  A&Ox3, pleasant ENT: No sinus tenderness, TM clear, pale nasal mucosa, no oral exudate,no post nasal drip, no LAN Cardiac: S1,  S2, regular rate and rhythm, no murmur Chest: No wheeze/ rales/ dullness; no accessory muscle use, no nasal flaring, no sternal retractions, diminished per bases Abd.: Soft Non-tender, Body mass index is 49.07 kg/m., MO Ext: No clubbing cyanosis, edema Neuro:  normal strength, MAE x 4, A&O x 3 Skin: No rashes, No lesions, warm and dry Psych: normal mood and behavior, appropriate   Assessment/Plan COVID 19 with multiple co morbidities diagnosed 10/18/2018 Treated with Pred taper and Z pack 10/21/2018>> she completed treatment ED visit 10/5>> CAP RLL>> Doxycycline and Amoxicillin initiated Dyspnea is better Now Diarrhea and stomach cramping not improved  with phenergan Stopped Doxycycline 10/7 and continued Amoxicillin Called office 10/12 with continued cough and secretions CXR 10/21 shows near resolution of infiltrate in right middle and lower lobe Plan CXR today  CBC with diff today BMET today Mucinex 1200 mg twice daily with a full glass of water. Flutter valve 4 puffs 3-4 times  a day Continue your albuterol nebs as needed for shortness of breath or wheezing Continue Dexilant and Carafate Probiotics once daily ( Culturelle) Imodium 1 tablet for stomach cramping as directed as needed Drink plenty of fluids Get plenty of rest Tylenol for body aches and fever Follow up CXR in 2 weeks  We will call you with the results If you get worse not better call the office or seek emergency care Please contact office for sooner follow up if symptoms do not improve or worsen or seek emergency care  Flu shot today   OSA Wearing every night>> excellent compliance Plan Continue on CPAP at bedtime. You appear to be benefiting from the treatment  Goal is to wear for at least 6 hours each night for maximal clinical benefit. Continue to work on weight loss, as the link between excess weight  and sleep apnea is well established.   Remember to establish a good bedtime routine, and work on sleep  hygiene.  Limit daytime naps , avoid stimulants such as caffeine and nicotine close to bedtime, exercise daily to promote sleep quality, avoid heavy , spicy, fried , or rich foods before bed. Ensure adequate exposure to natural light during the day,establish a relaxing bedtime routine with a pleasant sleep environment ( Bedroom between 60 and 67 degrees, turn off bright lights , TV or device screens screens , consider black out curtains or white noise machines) Do not drive if sleepy. Remember to clean mask, tubing, filter, and reservoir once weekly with soapy water.  Follow up with Dr. [name]   In 6 months  or before as needed.    Health Maintenance Flu shot 10/21 Pneumococcal 2017     Magdalen Spatz, NP 11/11/2018  2:56 PM

## 2018-11-12 ENCOUNTER — Telehealth: Payer: Self-pay | Admitting: Acute Care

## 2018-11-12 DIAGNOSIS — J1289 Other viral pneumonia: Secondary | ICD-10-CM

## 2018-11-12 DIAGNOSIS — U071 COVID-19: Secondary | ICD-10-CM

## 2018-11-12 DIAGNOSIS — J1282 Pneumonia due to coronavirus disease 2019: Secondary | ICD-10-CM

## 2018-11-12 NOTE — Telephone Encounter (Signed)
Please call patient. Let her know hwe labs look ok. Please place an order for a CBC in 2 weeks ( same day that she comes in for her CXR). I just want to take another look at it.  Also, make sure she follows up with her  her GI doc on November 6th, as scheduled.  Thanks so much

## 2018-11-13 ENCOUNTER — Telehealth: Payer: Self-pay | Admitting: Acute Care

## 2018-11-13 DIAGNOSIS — J1289 Other viral pneumonia: Secondary | ICD-10-CM

## 2018-11-13 DIAGNOSIS — J1282 Pneumonia due to coronavirus disease 2019: Secondary | ICD-10-CM

## 2018-11-13 DIAGNOSIS — U071 COVID-19: Secondary | ICD-10-CM

## 2018-11-13 MED ORDER — AZITHROMYCIN 250 MG PO TABS: ORAL_TABLET | ORAL | 0 refills | Status: DC

## 2018-11-13 NOTE — Telephone Encounter (Signed)
Called and spoke to patient. Patient stated she saw Charlotte Roch, NP on 11/11/2018 and was told if her phlegm changed color to call.  Patient stated her phlegm went from clear to green this morning. Patient stated she doesn't have any increased symptoms or issues. Reports "I feel fine."  Dr. [name] please advise. Thanks!

## 2018-11-13 NOTE — Telephone Encounter (Signed)
Called the patient and advised of the response noted below. Patient voiced understanding.Pharmacy confirmed, prescription sent. Nothing further needed at this time.

## 2018-11-13 NOTE — Telephone Encounter (Signed)
Triage please relay this message from [place] when you call pt please. Order for CBC has been placed.   Please call patient. Let her know hwe labs look ok. Please place an order for a CBC in 2 weeks ( same day that she comes in for her CXR). I just want to take another look at it.  Also, make sure she follows up with her  her GI doc on November 6th, as scheduled.  Thanks so much

## 2018-11-13 NOTE — Telephone Encounter (Signed)
zpak to keep on hand if gets worse > worse cough, darker sputum or more of it than what she's describing

## 2018-11-13 NOTE — Telephone Encounter (Signed)
Attached this message to triage message so we can advise her when they call her regarding message from 11/13/2018.

## 2018-11-18 ENCOUNTER — Other Ambulatory Visit: Payer: Self-pay

## 2018-11-18 DIAGNOSIS — Z20822 Contact with and (suspected) exposure to covid-19: Secondary | ICD-10-CM

## 2018-11-19 LAB — NOVEL CORONAVIRUS, NAA: SARS-CoV-2, NAA: NOT DETECTED

## 2018-11-25 ENCOUNTER — Ambulatory Visit (INDEPENDENT_AMBULATORY_CARE_PROVIDER_SITE_OTHER): Payer: 59

## 2018-11-25 ENCOUNTER — Other Ambulatory Visit (INDEPENDENT_AMBULATORY_CARE_PROVIDER_SITE_OTHER): Payer: 59

## 2018-11-25 ENCOUNTER — Telehealth: Payer: Self-pay

## 2018-11-25 DIAGNOSIS — J1289 Other viral pneumonia: Secondary | ICD-10-CM

## 2018-11-25 DIAGNOSIS — J1282 Pneumonia due to coronavirus disease 2019: Secondary | ICD-10-CM

## 2018-11-25 DIAGNOSIS — U071 COVID-19: Secondary | ICD-10-CM | POA: Diagnosis not present

## 2018-11-25 LAB — CBC WITH DIFFERENTIAL/PLATELET
Basophils Absolute: 0.1 10*3/uL (ref 0.0–0.1)
Basophils Relative: 0.5 % (ref 0.0–3.0)
Eosinophils Absolute: 0.3 10*3/uL (ref 0.0–0.7)
Eosinophils Relative: 2.8 % (ref 0.0–5.0)
HCT: 33.8 % — ABNORMAL LOW (ref 36.0–46.0)
Hemoglobin: 11.2 g/dL — ABNORMAL LOW (ref 12.0–15.0)
Lymphocytes Relative: 32.9 % (ref 12.0–46.0)
Lymphs Abs: 3.4 10*3/uL (ref 0.7–4.0)
MCHC: 33.1 g/dL (ref 30.0–36.0)
MCV: 91.6 fl (ref 78.0–100.0)
Monocytes Absolute: 0.6 10*3/uL (ref 0.1–1.0)
Monocytes Relative: 5.7 % (ref 3.0–12.0)
Neutro Abs: 6 10*3/uL (ref 1.4–7.7)
Neutrophils Relative %: 58.1 % (ref 43.0–77.0)
Platelets: 316 10*3/uL (ref 150.0–400.0)
RBC: 3.7 Mil/uL — ABNORMAL LOW (ref 3.87–5.11)
RDW: 14.7 % (ref 11.5–15.5)
WBC: 10.4 10*3/uL (ref 4.0–10.5)

## 2018-11-25 NOTE — Telephone Encounter (Signed)
Beth can you review, SG pt came in for a chest xray today and would like to know her results. She waited in the lobby and wanted to make sure we let her know the results today if possible. Please review and send to me please. Thank you!

## 2018-11-25 NOTE — Telephone Encounter (Signed)
ATC pt, no answer. Left message for pt to call back.  

## 2018-11-25 NOTE — Telephone Encounter (Signed)
CXR showed clear lungs.

## 2018-11-26 NOTE — Telephone Encounter (Signed)
Spoke with pt. She is aware of results. Nothing further was needed.  

## 2018-11-26 NOTE — Telephone Encounter (Signed)
Pt returning call

## 2018-11-27 ENCOUNTER — Encounter: Payer: Self-pay | Admitting: Gastroenterology

## 2018-11-27 ENCOUNTER — Ambulatory Visit (INDEPENDENT_AMBULATORY_CARE_PROVIDER_SITE_OTHER): Payer: 59 | Admitting: Gastroenterology

## 2018-11-27 VITALS — BP 138/74 | HR 82 | Temp 97.0°F | Ht 63.0 in | Wt 275.0 lb

## 2018-11-27 DIAGNOSIS — K582 Mixed irritable bowel syndrome: Secondary | ICD-10-CM | POA: Diagnosis not present

## 2018-11-27 DIAGNOSIS — R1013 Epigastric pain: Secondary | ICD-10-CM

## 2018-11-27 DIAGNOSIS — K219 Gastro-esophageal reflux disease without esophagitis: Secondary | ICD-10-CM | POA: Diagnosis not present

## 2018-11-27 DIAGNOSIS — Z8616 Personal history of COVID-19: Secondary | ICD-10-CM

## 2018-11-27 DIAGNOSIS — Z8619 Personal history of other infectious and parasitic diseases: Secondary | ICD-10-CM

## 2018-11-27 MED ORDER — SUCRALFATE 1 G PO TABS: 1.0000 g | ORAL_TABLET | Freq: Three times a day (TID) | ORAL | 1 refills | Status: DC

## 2018-11-27 MED ORDER — DEXILANT 60 MG PO CPDR: 60.0000 mg | DELAYED_RELEASE_CAPSULE | ORAL | 3 refills | Status: DC

## 2018-11-27 NOTE — Progress Notes (Signed)
[name]    [id number]    1960-12-16  Primary Care Physician:Boyd, Dola Factor, MD  Referring Physician: Bartholome Bill, MD [place],  [place] [address]   Chief complaint:  Abdominal pain  HPI: 58 year old female with history of morbid obesity, obstructive sleep apnea, COPD here for follow-up visit. She was diagnosed with positive COVID-19 October 18, 2018.  She was treated with Z-Pak and prednisone taper.  She presented to  ER October 5 with recurrent fevers and shortness of breath.  She had infiltrates on chest x-ray and was sent home on amoxicillin and doxycycline.  Patient feels her GI symptoms significantly worsened with antibiotics and steroids.  She was on bland diet for some time but currently she is back on her regular diet now.  She is accompanied by her daughter during this visit and according to her she has been eating more fried foods and hamburgers. She is taking Dexilant daily.  She also has to take Zofran daily before meals and sucralfate.  If she skips sucralfate before meals she has severe abdominal discomfort. Denies constipation, she is having 2-3 bowel movements daily, increased urgency, always has a bowel movement after a meal.  Denies any vomiting, melena or blood per rectum. She lost 25 pounds in the past 2 months. She is worried about recurrent gastric ulcers.  Relevant GI history: EGD July 28, 2018: Few superficial linear nonbleeding gastric ulcers in the antrum and gastritis.  Benign fundic gland polyps.  Esophagus and duodenum was normal.  EGD June 22, 2018 with moderate amount of residue food in the body and fundus of stomach with limited visualization of the lining, gastritis, biopsies negative for H. pylori  Gastric emptying scan July 08, 2018: Normal  CT abdomen pelvis March 30, 2018: No acute intra-abdominal or pelvic abnormality, moderate fat-containing umbilical hernia  Colonoscopy April 2019  with removal of 11 mm hamartomatous polyp in sigmoid colon  Colonoscopy November 04, 2003 normal with no polyps  Outpatient Encounter Medications as of 11/27/2018  Medication Sig  . albuterol (PROVENTIL) (2.5 MG/3ML) 0.083% nebulizer solution Take 3 mLs (2.5 mg total) by nebulization every 4 (four) hours as needed for wheezing or shortness of breath.  [name] amoxicillin-clavulanate (AUGMENTIN) 875-125 MG tablet Take 1 tablet by mouth every 12 (twelve) hours.  [name] aspirin 81 MG EC tablet TAKE 1 TABLET BY MOUTH EVERY DAY (Patient taking differently: Take 81 mg by mouth daily. )  . azithromycin (ZITHROMAX) 250 MG tablet Take 2 pills today then one a day for 4 additional days. DX: J40  . benzonatate (TESSALON) 200 MG capsule Take 1 capsule (200 mg total) by mouth 3 (three) times daily as needed for cough. (Patient taking differently: Take 200 mg by mouth 2 (two) times daily as needed for cough. )  . carvedilol (COREG) 12.5 MG tablet TAKE 1 TABLET BY MOUTH TWICE A DAY WITH FOOD (Patient taking differently: Take 12.5 mg by mouth 2 (two) times daily with a meal. )  . dexlansoprazole (DEXILANT) 60 MG capsule Take 1 capsule (60 mg total) by mouth every morning. Take 30 minutes before breakast  . dicyclomine (BENTYL) 10 MG capsule Take 1 capsule (10 mg total) by mouth 3 (three) times daily before meals for 5 days. (Patient taking differently: Take 10 mg by mouth 4 (four) times daily -  before meals and at bedtime. )  . doxycycline (VIBRAMYCIN) 100 MG capsule Take  1 capsule (100 mg total) by mouth 2 (two) times daily.  . fluticasone (FLONASE) 50 MCG/ACT nasal spray SPRAY 2 SPRAYS INTO EACH NOSTRIL EVERY DAY  . Guaifenesin (MUCINEX MAXIMUM STRENGTH) 1200 MG TB12 Take 1,200 mg by mouth daily.  . hydrALAZINE (APRESOLINE) 25 MG tablet TAKE 1 TABLET BY MOUTH THREE TIMES A DAY (Patient taking differently: Take 25 mg by mouth 3 (three) times daily. )  . isosorbide dinitrate (ISORDIL) 20 MG tablet Take 1 tablet (20 mg total)  by mouth 3 (three) times daily.  [name] levocetirizine (XYZAL) 5 MG tablet Take 5 mg by mouth every evening.  . loperamide (IMODIUM) 2 MG capsule Take 1 capsule (2 mg total) by mouth 4 (four) times daily as needed for diarrhea or loose stools.  [name] losartan (COZAAR) 25 MG tablet TAKE 2 TABLETS BY MOUTH EVERY DAY  . losartan (COZAAR) 50 MG tablet TAKE 1 TABLET BY MOUTH EVERY DAY (Patient taking differently: Take 50 mg by mouth daily. )  . lubiprostone (AMITIZA) 24 MCG capsule Take 1 capsule (24 mcg total) by mouth 2 (two) times daily with a meal.  . metolazone (ZAROXOLYN) 2.5 MG tablet Take 2.5 mg by mouth daily as needed (FOR OVERNIGHT WEIGHT GAIN OF 3 lbs. or more).  . montelukast (SINGULAIR) 10 MG tablet TAKE 1 TABLET(10 MG) BY MOUTH AT BEDTIME (Patient taking differently: Take 10 mg by mouth at bedtime. )  . ondansetron (ZOFRAN) 4 MG tablet Take 1 tablet (4 mg total) by mouth every 8 (eight) hours as needed for nausea or vomiting.  . polyethylene glycol powder (GLYCOLAX/MIRALAX) powder Use 2 capfuls in the morning and 1 capful in the evening and as directed (Patient taking differently: Take 17-34 g by mouth daily as needed for moderate constipation. )  . Potassium Chloride ER 20 MEQ TBCR TAKE 3 TABLETS BY MOUTH 3 TIMES A DAY  . promethazine (PHENERGAN) 25 MG tablet Take 1 tablet (25 mg total) by mouth every 6 (six) hours as needed for nausea or vomiting.  [name] Respiratory Therapy Supplies (FLUTTER) DEVI 1 application by Does not apply route as directed.  [name] spironolactone (ALDACTONE) 25 MG tablet TAKE 1/2 TABLET BY MOUTH TWICE A DAY (Patient taking differently: Take 12.5 mg by mouth 2 (two) times daily. )  . sucralfate (CARAFATE) 1 g tablet Take 1 tablet (1 g total) by mouth 4 (four) times daily -  with meals and at bedtime.  . topiramate (TOPAMAX) 25 MG tablet Take 25 mg by mouth 2 (two) times daily as needed (for migraines).   . torsemide (DEMADEX) 20 MG tablet TAKE 2 TABLETS(40 MG) BY MOUTH TWICE DAILY  (Patient taking differently: Take 40 mg by mouth 2 (two) times daily. )  . traMADol (ULTRAM) 50 MG tablet Take 1 tablet (50 mg total) by mouth every 6 (six) hours as needed. (Patient taking differently: Take 50 mg by mouth every 6 (six) hours as needed for moderate pain. )  . [DISCONTINUED] potassium chloride SA (K-DUR,KLOR-CON) 20 MEQ tablet TAKE 3 TABLETS (60 MEQ) BY MOUTH THREE TIMES DAILY (Patient taking differently: Take 180 mEq by mouth daily. )   No facility-administered encounter medications on file as of 11/27/2018.     Allergies as of 11/27/2018 - Review Complete 11/11/2018  Allergen Reaction Noted  . Sulfonamide derivatives Anaphylaxis, Swelling, Rash, and Other (See Comments) 10/21/2006  . Doxycycline Nausea And Vomiting 01/05/2008  . Latex Hives 09/28/2011  . Ciprofloxacin Rash 10/21/2006  . Fish allergy Rash and Other (See Comments)  03/08/2012  . Hydrocortisone Rash   . Neomycin Rash 10/21/2006    Past Medical History:  Diagnosis Date  . Acute on chronic diastolic CHF (congestive heart failure) ([facility]) 04/11/2014  . Acute suppurative otitis media without spontaneous rupture of eardrum 02/04/2007   Centricity Description: OTITIS MEDIA, SUPPURATIVE, ACUTE, BILATERAL Qualifier: Diagnosis of  By: [name] MD, [name]  Centricity Description: OTITIS MEDIA, SUPPURATIVE, ACUTE Qualifier: Diagnosis of  By: [name] MD, [name]   . Allergic rhinitis   . Allergy   . Anemia   . Anxiety   . Arthritis   . CAD (coronary artery disease)    LHC (11/29/1999):  EF 60%; no significant CAD.  [name] Carpal tunnel syndrome   . Carpal tunnel syndrome 06/09/2006   Qualifier: Diagnosis of  By: Marca Ancona RMA, Lucy    . Chronic diastolic CHF (congestive heart failure) ([place])    a. Echo (01/21/11): Vigorous LVF, EF 65-70%, no RWMA, Gr 2 DD. b.  Echo (12/15): Moderate LVH, EF 123456, grade 2 diastolic dysfunction, mild LAE, normal RV function c. 01/2015: echo showing EF of 60-65% with mild LVH  . Common migraine   .  COPD (chronic obstructive pulmonary disease) ([facility])   . Diastolic heart failure ([place])   . Diverticulitis   . GERD (gastroesophageal reflux disease)   . Hepatic steatosis   . Hepatomegaly   . Hiatal hernia   . History of cardiomegaly   . Hyperlipidemia   . Hypertension   . IBS (irritable bowel syndrome)   . Internal hemorrhoids   . Metabolic syndrome   . Morbid obesity ([place])   . OSA (obstructive sleep apnea)    CPAP dependent  . PNEUMONIA, ORGAN UNSPECIFIED 03/01/2009   Qualifier: Diagnosis of  By: [name] CMA, [name]    . Sinusitis, chronic 03/10/2012   CT sinuses 02/2012:  Chronic sinusitis with small A/F levels >> rx by ENT with prolonged augmentin F/u ct sinuses 04/2012:  Persistent sinus thickening >> rx with levaquin and clinda for 30 days.  Told to return for sinus scan in 6 weeks >> no showed for followup visit.  CT sinuses 11/2013:  Acute and chronic sinusitis noted.    . Sleep apnea    bi pap    Past Surgical History:  Procedure Laterality Date  . ABDOMINAL HYSTERECTOMY    . BIOPSY  06/22/2018   Procedure: BIOPSY;  Surgeon: Mauri Pole, MD;  Location: [facility];  Service: Endoscopy;;  . BIOPSY  07/28/2018   Procedure: BIOPSY;  Surgeon: Mauri Pole, MD;  Location: [facility];  Service: Endoscopy;;  . CESAREAN SECTION    . COLONOSCOPY    . COLONOSCOPY WITH PROPOFOL N/A 05/05/2017   Procedure: COLONOSCOPY WITH PROPOFOL;  Surgeon: Mauri Pole, MD;  Location: [facility];  Service: Endoscopy;  Laterality: N/A;  . ESOPHAGOGASTRODUODENOSCOPY (EGD) WITH PROPOFOL N/A 06/22/2018   Procedure: ESOPHAGOGASTRODUODENOSCOPY (EGD) WITH PROPOFOL;  Surgeon: Mauri Pole, MD;  Location: [facility];  Service: Endoscopy;  Laterality: N/A;  . ESOPHAGOGASTRODUODENOSCOPY (EGD) WITH PROPOFOL N/A 07/28/2018   Procedure: ESOPHAGOGASTRODUODENOSCOPY (EGD) WITH PROPOFOL;  Surgeon: Mauri Pole, MD;  Location: [facility];  Service: Endoscopy;  Laterality: N/A;  .  NASAL SINUS SURGERY    . PARTIAL HYSTERECTOMY    . TONSILLECTOMY    . TUBAL LIGATION    . UVULOPALATOPHARYNGOPLASTY      Family History  Problem Relation Age of Onset  . Heart disease Mother   . Heart attack Mother   .  Hypertension Mother   . Coronary artery disease Other        1st degree relatvie <50  . Breast cancer Other        aunt- ? paternal or maternal  . Asthma Sister   . Colon polyps Sister   . Stroke Neg Hx   . Colon cancer Neg Hx   . Esophageal cancer Neg Hx   . Rectal cancer Neg Hx   . Stomach cancer Neg Hx     Social History   Socioeconomic History  . Marital status: Widowed    Spouse name: Not on file  . Number of children: 2  . Years of education: Not on file  . Highest education level: Not on file  Occupational History  . Occupation: Retired  Scientific laboratory technician  . Financial resource strain: Not on file  . Food insecurity    Worry: Not on file    Inability: Not on file  . Transportation needs    Medical: Not on file    Non-medical: Not on file  Tobacco Use  . Smoking status: Never Smoker  . Smokeless tobacco: Never Used  Substance and Sexual Activity  . Alcohol use: No  . Drug use: No  . Sexual activity: Not on file  Lifestyle  . Physical activity    Days per week: Not on file    Minutes per session: Not on file  . Stress: Not on file  Relationships  . Social Herbalist on phone: Not on file    Gets together: Not on file    Attends religious service: Not on file    Active member of club or organization: Not on file    Attends meetings of clubs or organizations: Not on file    Relationship status: Not on file  . Intimate partner violence    Fear of current or ex partner: Not on file    Emotionally abused: Not on file    Physically abused: Not on file    Forced sexual activity: Not on file  Other Topics Concern  . Not on file  Social History Narrative   Charity fundraiser, Oceanographer.   Widowed, lives with son.       Review of systems: Review of Systems  Constitutional: Negative for fever and chills.  HENT: Negative.   Eyes: Negative for blurred vision.  Respiratory: Negative for cough, shortness of breath and wheezing.   Cardiovascular: Negative for chest pain and palpitations.  Gastrointestinal: as per HPI Genitourinary: Negative for dysuria, urgency, frequency and hematuria.  Musculoskeletal: Positive for myalgias, back pain and joint pain.  Skin: Negative for itching and rash.  Neurological: Negative for dizziness, tremors, focal weakness, seizures and loss of consciousness.  Endo/Heme/Allergies: Negative Psychiatric/Behavioral: Negative for depression, suicidal ideas and hallucinations.  All other systems reviewed and are negative.   Physical Exam: Vitals:   11/27/18 1350  BP: 138/74  Pulse: 82  Temp: (!) 97 F (36.1 C)   Body mass index is 48.71 kg/m. Gen:      No acute distress HEENT:  EOMI, sclera anicteric Neck:     No masses; no thyromegaly Lungs:    Clear to auscultation bilaterally; normal respiratory effort CV:         Regular rate and rhythm; no murmurs Abd:      + bowel sounds; soft, non-tender; no palpable masses, no distension Ext:    No edema; adequate peripheral perfusion Skin:      Warm and  dry; no rash Neuro: alert and oriented x 3 Psych: normal mood and affect  Data Reviewed:  Reviewed labs, radiology imaging, old records and pertinent past GI work up   Assessment and Plan/Recommendations:  58 year old female with morbid obesity, hypertension, hyperlipidemia, COPD, OSA, diastolic dysfunction, recent Covid 52, pulmonary infiltrates treated with multiple antibiotics and steroid taper with complaints of worsening epigastric abdominal pain  Continue Dexilant 60 mg daily and antireflux measures Continue sucralfate before meals three times daily and at bedtime as needed We will schedule for EGD to exclude high risk gastric ulcers or worsening gastritis The risks  and benefits as well as alternatives of endoscopic procedure(s) have been discussed and reviewed. All questions answered. The patient agrees to proceed.  Small frequent meals Avoid high-fat and high calorie diet  IBS with constipation and diarrhea: Continue MiraLAX one capful daily as needed  K. Denzil Magnuson , MD    CC: Bartholome Bill, MD

## 2018-11-27 NOTE — Patient Instructions (Addendum)
You have been scheduled for an endoscopy. Please follow written instructions given to you at your visit today. If you use inhalers (even only as needed), please bring them with you on the day of your procedure.   We have sent refills of Carafate and Dexilant to your pharmacy   Gastroesophageal Reflux Disease, Adult Gastroesophageal reflux (GER) happens when acid from the stomach flows up into the tube that connects the mouth and the stomach (esophagus). Normally, food travels down the esophagus and stays in the stomach to be digested. However, when a person has GER, food and stomach acid sometimes move back up into the esophagus. If this becomes a more serious problem, the person may be diagnosed with a disease called gastroesophageal reflux disease (GERD). GERD occurs when the reflux:  Happens often.  Causes frequent or severe symptoms.  Causes problems such as damage to the esophagus. When stomach acid comes in contact with the esophagus, the acid may cause soreness (inflammation) in the esophagus. Over time, GERD may create small holes (ulcers) in the lining of the esophagus. What are the causes? This condition is caused by a problem with the muscle between the esophagus and the stomach (lower esophageal sphincter, or LES). Normally, the LES muscle closes after food passes through the esophagus to the stomach. When the LES is weakened or abnormal, it does not close properly, and that allows food and stomach acid to go back up into the esophagus. The LES can be weakened by certain dietary substances, medicines, and medical conditions, including:  Tobacco use.  Pregnancy.  Having a hiatal hernia.  Alcohol use.  Certain foods and beverages, such as coffee, chocolate, onions, and peppermint. What increases the risk? You are more likely to develop this condition if you:  Have an increased body weight.  Have a connective tissue disorder.  Use NSAID medicines. What are the signs or  symptoms? Symptoms of this condition include:  Heartburn.  Difficult or painful swallowing.  The feeling of having a lump in the throat.  Abitter taste in the mouth.  Bad breath.  Having a large amount of saliva.  Having an upset or bloated stomach.  Belching.  Chest pain. Different conditions can cause chest pain. Make sure you see your health care provider if you experience chest pain.  Shortness of breath or wheezing.  Ongoing (chronic) cough or a night-time cough.  Wearing away of tooth enamel.  Weight loss. How is this diagnosed? Your health care provider will take a medical history and perform a physical exam. To determine if you have mild or severe GERD, your health care provider may also monitor how you respond to treatment. You may also have tests, including:  A test to examine your stomach and esophagus with a small camera (endoscopy).  A test thatmeasures the acidity level in your esophagus.  A test thatmeasures how much pressure is on your esophagus.  A barium swallow or modified barium swallow test to show the shape, size, and functioning of your esophagus. How is this treated? The goal of treatment is to help relieve your symptoms and to prevent complications. Treatment for this condition may vary depending on how severe your symptoms are. Your health care provider may recommend:  Changes to your diet.  Medicine.  Surgery. Follow these instructions at home: Eating and drinking   Follow a diet as recommended by your health care provider. This may involve avoiding foods and drinks such as: ? Coffee and tea (with or without caffeine). ?  Drinks that containalcohol. ? Energy drinks and sports drinks. ? Carbonated drinks or sodas. ? Chocolate and cocoa. ? Peppermint and mint flavorings. ? Garlic and onions. ? Horseradish. ? Spicy and acidic foods, including peppers, chili powder, curry powder, vinegar, hot sauces, and barbecue sauce. ? Citrus  fruit juices and citrus fruits, such as oranges, lemons, and limes. ? Tomato-based foods, such as red sauce, chili, salsa, and pizza with red sauce. ? Fried and fatty foods, such as donuts, french fries, potato chips, and high-fat dressings. ? High-fat meats, such as hot dogs and fatty cuts of red and white meats, such as rib eye steak, sausage, ham, and bacon. ? High-fat dairy items, such as whole milk, butter, and cream cheese.  Eat small, frequent meals instead of large meals.  Avoid drinking large amounts of liquid with your meals.  Avoid eating meals during the 2-3 hours before bedtime.  Avoid lying down right after you eat.  Do not exercise right after you eat. Lifestyle   Do not use any products that contain nicotine or tobacco, such as cigarettes, e-cigarettes, and chewing tobacco. If you need help quitting, ask your health care provider.  Try to reduce your stress by using methods such as yoga or meditation. If you need help reducing stress, ask your health care provider.  If you are overweight, reduce your weight to an amount that is healthy for you. Ask your health care provider for guidance about a safe weight loss goal. General instructions  Pay attention to any changes in your symptoms.  Take over-the-counter and prescription medicines only as told by your health care provider. Do not take aspirin, ibuprofen, or other NSAIDs unless your health care provider told you to do so.  Wear loose-fitting clothing. Do not wear anything tight around your waist that causes pressure on your abdomen.  Raise (elevate) the head of your bed about 6 inches (15 cm).  Avoid bending over if this makes your symptoms worse.  Keep all follow-up visits as told by your health care provider. This is important. Contact a health care provider if:  You have: ? New symptoms. ? Unexplained weight loss. ? Difficulty swallowing or it hurts to swallow. ? Wheezing or a persistent cough. ? A  hoarse voice.  Your symptoms do not improve with treatment. Get help right away if you:  Have pain in your arms, neck, jaw, teeth, or back.  Feel sweaty, dizzy, or light-headed.  Have chest pain or shortness of breath.  Vomit and your vomit looks like blood or coffee grounds.  Faint.  Have stool that is bloody or black.  Cannot swallow, drink, or eat. Summary  Gastroesophageal reflux happens when acid from the stomach flows up into the esophagus. GERD is a disease in which the reflux happens often, causes frequent or severe symptoms, or causes problems such as damage to the esophagus.  Treatment for this condition may vary depending on how severe your symptoms are. Your health care provider may recommend diet and lifestyle changes, medicine, or surgery.  Contact a health care provider if you have new or worsening symptoms.  Take over-the-counter and prescription medicines only as told by your health care provider. Do not take aspirin, ibuprofen, or other NSAIDs unless your health care provider told you to do so.  Keep all follow-up visits as told by your health care provider. This is important. This information is not intended to replace advice given to you by your health care provider. Make sure you discuss  any questions you have with your health care provider. Document Released: 10/17/2004 Document Revised: 07/16/2017 Document Reviewed: 07/16/2017 Elsevier Patient Education  [place].  I appreciate the  opportunity to care for you  Thank You   Harl Bowie , MD

## 2018-11-30 ENCOUNTER — Telehealth: Payer: Self-pay | Admitting: Gastroenterology

## 2018-11-30 MED ORDER — LUBIPROSTONE 24 MCG PO CAPS: 24.0000 ug | ORAL_CAPSULE | Freq: Two times a day (BID) | ORAL | 3 refills | Status: DC

## 2018-11-30 NOTE — Telephone Encounter (Signed)
Sent in rx for 90 days to her pharmacy of [facility]

## 2018-11-30 NOTE — Telephone Encounter (Signed)
Pt is requesting prescription for 90-day supply of [facility]. Pt states that her pharmacy has order for 30 days but it needs to be for 90 days.

## 2018-12-04 ENCOUNTER — Other Ambulatory Visit: Payer: Self-pay

## 2018-12-04 ENCOUNTER — Encounter: Payer: Self-pay | Admitting: Gastroenterology

## 2018-12-04 DIAGNOSIS — Z20822 Contact with and (suspected) exposure to covid-19: Secondary | ICD-10-CM

## 2018-12-06 LAB — NOVEL CORONAVIRUS, NAA: SARS-CoV-2, NAA: NOT DETECTED

## 2018-12-09 ENCOUNTER — Ambulatory Visit (AMBULATORY_SURGERY_CENTER): Payer: 59 | Admitting: Gastroenterology

## 2018-12-09 ENCOUNTER — Encounter: Payer: Self-pay | Admitting: Gastroenterology

## 2018-12-09 ENCOUNTER — Telehealth: Payer: Self-pay | Admitting: Gastroenterology

## 2018-12-09 ENCOUNTER — Other Ambulatory Visit: Payer: Self-pay

## 2018-12-09 VITALS — BP 119/63 | HR 90 | Temp 98.5°F | Resp 18 | Ht 63.0 in | Wt 275.0 lb

## 2018-12-09 DIAGNOSIS — R1013 Epigastric pain: Secondary | ICD-10-CM

## 2018-12-09 MED ORDER — METOCLOPRAMIDE HCL 5 MG PO TABS: 5.0000 mg | ORAL_TABLET | Freq: Two times a day (BID) | ORAL | 0 refills | Status: DC

## 2018-12-09 MED ORDER — SODIUM CHLORIDE 0.9 % IV SOLN: 500.0000 mL | INTRAVENOUS | Status: DC

## 2018-12-09 NOTE — Op Note (Signed)
[place] Patient Name: [name] Procedure Date: 12/09/2018 8:44 AM MRN: [id number] Endoscopist: Mauri Pole , MD Age: 58 Referring MD:  Date of Birth: 1960-05-19 Gender: Female Account #: [id number] Procedure:                Upper GI endoscopy Indications:              Epigastric abdominal pain Medicines:                Monitored Anesthesia Care Procedure:                Pre-Anesthesia Assessment:                           - Prior to the procedure, a History and Physical                            was performed, and patient medications and                            allergies were reviewed. The patient's tolerance of                            previous anesthesia was also reviewed. The risks                            and benefits of the procedure and the sedation                            options and risks were discussed with the patient.                            All questions were answered, and informed consent                            was obtained. Prior Anticoagulants: The patient has                            taken no previous anticoagulant or antiplatelet                            agents. ASA Grade Assessment: III - A patient with                            severe systemic disease. After reviewing the risks                            and benefits, the patient was deemed in                            satisfactory condition to undergo the procedure.                           After obtaining informed consent, the endoscope was  passed under direct vision. Throughout the                            procedure, the patient's blood pressure, pulse, and                            oxygen saturations were monitored continuously. The                            Endoscope was introduced through the mouth, and                            advanced to the second part of duodenum. The upper                            GI endoscopy was  accomplished without difficulty.                            The patient tolerated the procedure well. Scope In: Scope Out: Findings:                 The esophagus was normal.                           A large amount of a phytobezoar was found in the                            gastric body with no evidence of gastric outlet                            obstruction. Limited visualization of the gastric                            mucosa                           The examined duodenum was normal. Complications:            No immediate complications. Estimated Blood Loss:     Estimated blood loss: none. Impression:               - Normal esophagus.                           - A large amount of a phytobezoar in the stomach.                            Gastroparesis likely post viral infection.                           - Normal examined duodenum.                           - No specimens collected. Recommendation:           - Patient has a contact number available for  emergencies. The signs and symptoms of potential                            delayed complications were discussed with the                            patient. Return to normal activities tomorrow.                            Written discharge instructions were provided to the                            patient.                           - Gastroparesis diet.                           - Continue present medications.                           - Stop Zofram                           - Start Reglan 5mg  twice daily                           - Continue Dexilant                           - Anti reflux measures                           - Return to GI office at the next available                            appointment in 2-4 weeks with APP Mauri Pole, MD 12/09/2018 8:56:44 AM This report has been signed electronically.

## 2018-12-09 NOTE — Progress Notes (Signed)
Temp JB  vs CW 

## 2018-12-09 NOTE — Progress Notes (Signed)
Report to PACU, RN, vss, BBS= Clear.  

## 2018-12-09 NOTE — Patient Instructions (Signed)
Please read handouts provided. Gastroparesis diet. Continue present medications. Stop Zofran. Begin Reglan 5 mg twice daily. Continue Dexilant. Anti-reflux measures. Return to GI office in 2-4 weeks.       YOU HAD AN ENDOSCOPIC PROCEDURE TODAY AT [facility]:   Refer to the procedure report that was given to you for any specific questions about what was found during the examination.  If the procedure report does not answer your questions, please call your gastroenterologist to clarify.  If you requested that your care partner not be given the details of your procedure findings, then the procedure report has been included in a sealed envelope for you to review at your convenience later.  YOU SHOULD EXPECT: Some feelings of bloating in the abdomen. Passage of more gas than usual.  Walking can help get rid of the air that was put into your GI tract during the procedure and reduce the bloating. If you had a lower endoscopy (such as a colonoscopy or flexible sigmoidoscopy) you may notice spotting of blood in your stool or on the toilet paper. If you underwent a bowel prep for your procedure, you may not have a normal bowel movement for a few days.  Please Note:  You might notice some irritation and congestion in your nose or some drainage.  This is from the oxygen used during your procedure.  There is no need for concern and it should clear up in a day or so.  SYMPTOMS TO REPORT IMMEDIATELY:    Following upper endoscopy (EGD)  Vomiting of blood or coffee ground material  New chest pain or pain under the shoulder blades  Painful or persistently difficult swallowing  New shortness of breath  Fever of 100F or higher  Black, tarry-looking stools  For urgent or emergent issues, a gastroenterologist can be reached at any hour by calling [phone].   DIET:  We do recommend a small meal at first, but then you may proceed to your regular diet.  Drink plenty of fluids but  you should avoid alcoholic beverages for 24 hours.  ACTIVITY:  You should plan to take it easy for the rest of today and you should NOT DRIVE or use heavy machinery until tomorrow (because of the sedation medicines used during the test).    FOLLOW UP: Our staff will call the number listed on your records 48-72 hours following your procedure to check on you and address any questions or concerns that you may have regarding the information given to you following your procedure. If we do not reach you, we will leave a message.  We will attempt to reach you two times.  During this call, we will ask if you have developed any symptoms of COVID 19. If you develop any symptoms (ie: fever, flu-like symptoms, shortness of breath, cough etc.) before then, please call [phone].  If you test positive for Covid 19 in the 2 weeks post procedure, please call and report this information to [place].    If any biopsies were taken you will be contacted by phone or by letter within the next 1-3 weeks.  Please call us at [phone] if you have not heard about the biopsies in 3 weeks.    SIGNATURES/CONFIDENTIALITY: You and/or your care partner have signed paperwork which will be entered into your electronic medical record.  These signatures attest to the fact that that the information above on your After Visit Summary has been reviewed and is understood.  Full responsibility of  the confidentiality of this discharge information lies with you and/or your care-partner. 

## 2018-12-09 NOTE — Telephone Encounter (Signed)
Pls call pt, she has specific clinical questions about the findings of her procedure.

## 2018-12-10 NOTE — Telephone Encounter (Signed)
She had questions about what certain terms in the procedure report mean. Discussed the definition of post viral gastritis and phytobezoars.

## 2018-12-11 ENCOUNTER — Telehealth: Payer: Self-pay | Admitting: *Deleted

## 2018-12-11 NOTE — Telephone Encounter (Signed)
  Follow up Call-  Call back number 12/09/2018  Post procedure Call Back phone  # [phone]  Permission to leave phone message Yes  Some recent data might be hidden     Patient questions:  Do you have a fever, pain , or abdominal swelling? No. Pain Score  0 *  Have you tolerated food without any problems? Yes.    Have you been able to return to your normal activities? Yes.    Do you have any questions about your discharge instructions: Diet   No. Medications  No. Follow up visit  No.  Do you have questions or concerns about your Care? No.  Actions: * If pain score is 4 or above: No action needed, pain <4.

## 2018-12-16 ENCOUNTER — Other Ambulatory Visit: Payer: Self-pay

## 2018-12-16 DIAGNOSIS — Z20822 Contact with and (suspected) exposure to covid-19: Secondary | ICD-10-CM

## 2018-12-17 LAB — NOVEL CORONAVIRUS, NAA: SARS-CoV-2, NAA: NOT DETECTED

## 2019-01-04 ENCOUNTER — Other Ambulatory Visit: Payer: Self-pay | Admitting: Cardiovascular Disease

## 2019-01-04 ENCOUNTER — Other Ambulatory Visit: Payer: Self-pay

## 2019-01-04 ENCOUNTER — Other Ambulatory Visit: Payer: Self-pay | Admitting: Gastroenterology

## 2019-01-04 MED ORDER — SPIRONOLACTONE 25 MG PO TABS: 12.5000 mg | ORAL_TABLET | Freq: Two times a day (BID) | ORAL | 0 refills | Status: DC

## 2019-01-04 MED ORDER — CARVEDILOL 12.5 MG PO TABS: 12.5000 mg | ORAL_TABLET | Freq: Two times a day (BID) | ORAL | 0 refills | Status: DC

## 2019-01-04 MED ORDER — BENZONATATE 200 MG PO CAPS: 200.0000 mg | ORAL_CAPSULE | Freq: Three times a day (TID) | ORAL | 0 refills | Status: DC | PRN

## 2019-01-05 ENCOUNTER — Other Ambulatory Visit: Payer: Self-pay | Admitting: Cardiovascular Disease

## 2019-01-05 NOTE — Telephone Encounter (Signed)
*  STAT* If patient is at the pharmacy, call can be transferred to refill team.   1. Which medications need to be refilled? (please list name of each medication and dose if known)  Potassium Chloride ER 20 MEQ TBCR  2. Which pharmacy/location (including street and city if local pharmacy) is medication to be sent to?  [id number] - [place], [place]  3. Do they need a 30 day or 90 day supply? 90 day

## 2019-01-06 ENCOUNTER — Telehealth (INDEPENDENT_AMBULATORY_CARE_PROVIDER_SITE_OTHER): Payer: 59 | Admitting: Acute Care

## 2019-01-06 ENCOUNTER — Telehealth: Payer: Self-pay | Admitting: Acute Care

## 2019-01-06 ENCOUNTER — Encounter: Payer: Self-pay | Admitting: Acute Care

## 2019-01-06 DIAGNOSIS — I110 Hypertensive heart disease with heart failure: Secondary | ICD-10-CM

## 2019-01-06 DIAGNOSIS — Z20828 Contact with and (suspected) exposure to other viral communicable diseases: Secondary | ICD-10-CM

## 2019-01-06 DIAGNOSIS — Z8619 Personal history of other infectious and parasitic diseases: Secondary | ICD-10-CM

## 2019-01-06 DIAGNOSIS — J42 Unspecified chronic bronchitis: Secondary | ICD-10-CM

## 2019-01-06 DIAGNOSIS — G4733 Obstructive sleep apnea (adult) (pediatric): Secondary | ICD-10-CM

## 2019-01-06 DIAGNOSIS — J449 Chronic obstructive pulmonary disease, unspecified: Secondary | ICD-10-CM

## 2019-01-06 DIAGNOSIS — E662 Morbid (severe) obesity with alveolar hypoventilation: Secondary | ICD-10-CM

## 2019-01-06 DIAGNOSIS — I509 Heart failure, unspecified: Secondary | ICD-10-CM

## 2019-01-06 MED ORDER — POTASSIUM CHLORIDE ER 20 MEQ PO TBCR: EXTENDED_RELEASE_TABLET | ORAL | 0 refills | Status: DC

## 2019-01-06 NOTE — Progress Notes (Signed)
Virtual Visit via Video Note  I connected with [name] on 01/06/19 at 11:00 AM EST by a video enabled telemedicine application and verified that I am speaking with the correct person using two identifiers.  Location: Patient: At  [facility] [place] , [place] Provider: At the office at [address] , [place], [place], [address]   I discussed the limitations of evaluation and management by telemedicine and the availability of in person appointments. The patient expressed understanding and agreed to proceed.  [name] is a 58 y.o. female never smoker with history of chronic cough, COPD/asthmawith recurrent severe cough " since her 65's", OSA on BiPAP, morbid obesity, chronic diastolic heart failure EF 65 to 70%, hypertension, hyperlipidemia IBS with constipation, ulcers, GERD, gastroparesis. She is followed by Dr. [name] and Dr. [name].   Synopsis 57yobfnever smokerwithhistory of chronic cough, COPD/asthmawith recurrent severe cough " since her 40's", OSA on BiPAP, morbid obesity, chronic diastolic heart failure EF 65 to 70%, hypertension, hyperlipidemia IBS with constipation, ulcers, GERD, gastroparesisShe recently tested +9/27/2020for COVID. She was told by her PCP to call Pulmonary if she had any change in color of secretions. She states her secretions have changed to green and she called requesting something be sent in. We are seeing her as a telephone visit prior to prescribing any treatments.She is followed by Dr. [name], and Dr. [name]   History of Present Illness: Pt.  was initially diagnosed with COVID 19 Sunday 9/27.She was seen virtually by me 9/30 with complaints of  change in color of secretions from clear to green and wheezing. She was treated with z pack and prednisone taper which she completed 10/4.She then  started to have chest pain and worsening shortness of breath, so she went to the ER for evaluationon 10/5.For theweek prior she hadhad endorsed   recurrent fevers as high as 101 that resolved with Tylenol but come back when the Tylenol wore off. She has associated congestion, nausea, vomiting, lightheadedness, hot sweats, generalized malaise and fatigue, diarrhea, abdominal pain,cough productive of white thick pastelike sputum, shortness of breath. She describes the shortness of breath as "when I take a deep breath it feels like someone is cutting my airway off". She feels tired when she tries to sit up and starts to move but denies any chest pain.  She has been taking the cough medicine and using her breathing treatments every 4 hours. Over the 24prior to the ED visitshe felt worse. She hadsharp pains constantly but significantly worse when she wore  her CPAP machine and when she did breathing treatments. She calledour office and wasadvised her to come to the ER due to her report of worsening shortness of breath and chest pains.Her son tested positive for COVID as well.CXR in the [place] a suspected infiltrate. She was sent home with Doxycycline and Amoxicillin for treatment. She had taken these medications for 3 days, and then developed  nausea and diarrhea, in addition to stomach pains. .She states she was also prescribed phenergan for nausea, but that did not help her symptoms. She has a history of stomach ulcers. She is currently being treated by GI. She is on maintenance medications that include Dexilantand Carafate. She states she has been compliant with both and she continues to have nausea and diarrhea. She is not taking a probiotic. She states her breathing is better and her chest pain has gone. She states her fever resolved by  10/6, and she has not checked it today due  to stomach discomfort.He last day to quarantine was 10/6. She called the office again 10/7 stating that she could not continue to take both antibiotics as they were making her so sick. We decided to stop the Doxycycline and continue with the Amoxicillin 875/125  for the full 7 days of treatment. We added a probiotic to protect intestinal  flora, and immodium 1 tablet only for stomach cramping. She had plenty of phenergan. She was to continue her Dexilantand Carafate, drink plenty of fluids, use Tylenol for fever and body aches, and BRAT diet until diarrhea and stomach issues were improved. .  Pt had follow up CXR 11/25/2018 .CXR shows near complete resolution of opacity in right middle and lower lobe.  At that time  had completed her antibiotics (full round of azithromycin, and Augmentin. She had 3 days of treatment with Doxycycline.) She did still have a productive cough, however secretions were clear.  She presents today for a televisit. She states she has had a cough with phlegm for the last 2 weeks,since going to [place], where she is staying until mid January.She denies any fever. She denies any shortness of breath. She states this is just an irritating tickle in the back of her throat that is worse when she lies down. She states the secretions are light yellow with an occasional speck of light green. She was treated by her PCP with Augmentin again on November  19 th for what she described as a sinus infection. Thick green secretions . She states she has completed this. She denies any current fever or pain under the eyes or above the eye brows.Temperature today is 98.0. She denies any leg swelling or edema. No coughing up of blood,no sensation of a racing heart.      Observations/Objective:  Video Visit Assessment ( limited by scope of virtual visit) General: Alert and appropriate, No distress ENT: No nasal drainage, trachea appears midline Cardiac: Unable to do virtually Chest: No audible wheeze,  no accessory muscle use, no nasal flaring, no sternal retractions Abd.: Not seen Ext: Not visualized Neuro:  Unable to assess virtually Skin: No rashes,No lesions from shoulders up. Psych: normal mood and behavior    01/06/2019 >>  SARS CoV-2  Overall Result Not Detected    CXR 11/25/2018 Complete resolution of infiltrate.   11/11/2018 CXR Near complete interval resolution of previously demonstrated heterogeneous airspace opacity of the right midlung and lung base, consistent with improved infection. Cardiomegaly.  10/26/2018 CXR  Suspected infiltrate in the right base. Recommend short-term follow-up imaging after treatment to ensure resolution.  CT Sinus 2018 IMPRESSION: Mild mucosal edema in the right maxillary sinus and right sphenoid sinus. Remaining sinuses clear  9/30/2020temperature 99.1 10/21/2018-weight-285 pounds 03/11/2018-office spirometry-FVC 2.6 (78% predicted), ratio 80, FEV1 2 (92% predicted), normal spirometry  Down Load 11/11/2018 AirCurve 10 VAuto IPAP 15 cm H2O EPAP 11 cm H2O Usage 30/30 days > 4 hours 29 days < 4 hours 1 day AHI is 1.8 ( Central 0.1 Obstructive 0.5 Unknown 1.0)  Assessment and Plan: Chronic Allergic vs Viral Bronchitis No wheezing No fever>> No indication for ABX>> has had multiple rounds of antibiotic over the last month No ability to check labs or CXR to assess for imaging changes/ WBC.  Previous CT sinus ( 2018)  with edema, but otherwise clear sinuses  Pt. In [place], and cannot come in for CXR or labs today or until 02/04/2018. Plan Continue Mucinex DM Continue Delsym if this is helping with cough Continue  Tessalon Perles Continue using breathing treatments as needed  Continue  pulmonary hygiene management Decrease  flutter valve use 2-3 times daily 10 breaths each time Continue  incentive spirometer >> goal should be to reach thousand Continue Chlortab's at bedtime Continue Singulair Continue nasal saline rinses 2-3 times daily Continue Flonase Add Zyrtec or Allegra ( Either is fine) in the morning Continue to monitor temperatures If you develop shortness of breath, or wheezing please seek emergency care If you develop leg swelling seek emergency  care. If you develop fever, please find an urgent care in [place] where you are to be seen, or seek emergency care where they can evaluate CXR and labs Follow up with Sayeed Weatherall NP in January once you are back in [place]. We will consider a CT chest  Once you are back in [place].  CPAP Therapy Plan Continue on CPAP at bedtime. You appear to be benefiting from the treatment  Goal is to wear for at least 6 hours each night for maximal clinical benefit. Continue to work on weight loss, as the link between excess weight  and sleep apnea is well established.   Remember to establish a good bedtime routine, and work on sleep hygiene.  Limit daytime naps , avoid stimulants such as caffeine and nicotine close to bedtime, exercise daily to promote sleep quality, avoid heavy , spicy, fried , or rich foods before bed. Ensure adequate exposure to natural light during the day,establish a relaxing bedtime routine with a pleasant sleep environment ( Bedroom between 60 and 67 degrees, turn off bright lights , TV or device screens screens , consider black out curtains or white noise machines) Do not drive if sleepy. Remember to clean mask, tubing, filter, and reservoir once weekly with soapy water.  Follow up with [name]  In 3-4  or before as needed.  ( Once back in [place] )  Follow Up Instructions: Follow up once patient has returned to [facility] for evaluation My nurse will call and schedule. She has been told to seek emergency care for any shortness of breath that does not self resolve, or any fever, change in secretions.    I discussed the assessment and treatment plan with the patient. The patient was provided an opportunity to ask questions and all were answered. The patient agreed with the plan and demonstrated an understanding of the instructions.   The patient was advised to call back or seek an in-person evaluation if the symptoms worsen or if the condition fails to improve as anticipated.  I provided 45  minutes of  non-face-to-face time during this encounter.   Magdalen Spatz, NP 01/06/2019 11:53 AM

## 2019-01-06 NOTE — Patient Instructions (Signed)
  It was good to talk with you today Continue Mucinex DM Continue Delsym if this is helping with cough Continue Tessalon Perles Continue using breathing treatments as needed  Continue  pulmonary hygiene management Decrease  flutter valve use 2-3 times daily 10 breaths each time Continue  incentive spirometer >> goal should be to reach thousand Continue Chlortab's at bedtime Continue Singulair Continue nasal saline rinses 2-3 times daily Continue Flonase Add Zyrtec or Allegra ( Either is fine) in the morning Continue to monitor temperatures If you develop shortness of breath, or wheezing please seek emergency care If you develop leg swelling seek emergency care. If you develop fever, please find an urgent care in [place] where you are to be seen, or seek emergency care where they can evaluate CXR and labs Follow up with Brookelyn Gaynor NP in January once you are back in [place]. We will consider a CT chest  Once you are back in [place]. Continue on CPAP at bedtime. You appear to be benefiting from the treatment  Goal is to wear for at least 6 hours each night for maximal clinical benefit. Continue to work on weight loss, as the link between excess weight  and sleep apnea is well established.   Remember to establish a good bedtime routine, and work on sleep hygiene.  Limit daytime naps , avoid stimulants such as caffeine and nicotine close to bedtime, exercise daily to promote sleep quality, avoid heavy , spicy, fried , or rich foods before bed. Ensure adequate exposure to natural light during the day,establish a relaxing bedtime routine with a pleasant sleep environment ( Bedroom between 60 and 67 degrees, turn off bright lights , TV or device screens screens , consider black out curtains or white noise machines) Do not drive if sleepy. Remember to clean mask, tubing, filter, and reservoir once weekly with soapy water.  Follow up with [name]  In 3-4  or before as needed.  ( Once back in [place] )  Follow Up  Instructions: Follow up once patient has returned to [facility] for evaluation My nurse will call and schedule. Seek emergency care for any shortness of breath that does not self resolve, or any fever, change in secretions, and leg swelling.    I discussed the assessment and treatment plan with the patient. The patient was provided an opportunity to ask questions and all were answered. The patient agreed with the plan and demonstrated an understanding of the instructions.   The patient was advised to call back or seek an in-person evaluation if the symptoms worsen or if the condition fails to improve as anticipated.

## 2019-01-06 NOTE — Telephone Encounter (Signed)
Reviewed chart and rx sent to pharmacy.  Patient notified and voiced understanding.

## 2019-01-07 ENCOUNTER — Other Ambulatory Visit: Payer: Self-pay

## 2019-01-12 NOTE — Telephone Encounter (Signed)
Seems like encounter was open in error so closing encounter.  

## 2019-01-13 ENCOUNTER — Other Ambulatory Visit: Payer: Self-pay | Admitting: Cardiovascular Disease

## 2019-01-14 ENCOUNTER — Other Ambulatory Visit: Payer: Self-pay | Admitting: Cardiovascular Disease

## 2019-01-20 NOTE — Telephone Encounter (Signed)
This encounter was created in error - please disregard.

## 2019-01-30 ENCOUNTER — Other Ambulatory Visit: Payer: Self-pay | Admitting: Gastroenterology

## 2019-02-04 ENCOUNTER — Other Ambulatory Visit: Payer: Self-pay | Admitting: Cardiovascular Disease

## 2019-02-10 ENCOUNTER — Encounter: Payer: Self-pay | Admitting: Acute Care

## 2019-02-10 ENCOUNTER — Telehealth (INDEPENDENT_AMBULATORY_CARE_PROVIDER_SITE_OTHER): Payer: 59 | Admitting: Acute Care

## 2019-02-10 DIAGNOSIS — G4733 Obstructive sleep apnea (adult) (pediatric): Secondary | ICD-10-CM | POA: Diagnosis not present

## 2019-02-10 DIAGNOSIS — J42 Unspecified chronic bronchitis: Secondary | ICD-10-CM | POA: Diagnosis not present

## 2019-02-10 DIAGNOSIS — J329 Chronic sinusitis, unspecified: Secondary | ICD-10-CM | POA: Diagnosis not present

## 2019-02-10 NOTE — Patient Instructions (Signed)
It was great to talk with you today Follow up in 1 month in the office Continue Continue Robitussin  DM as you have been doing for cough Continue using breathing treatments as needed  Continue  pulmonary hygiene management Decrease  flutter valve use 2-3 times daily 4 breaths each time Continue  incentive spirometer >> goal should be to reach thousand Continue Singulair daily as you have been doing Continue nasal saline rinses 2-3 times daily Continue Flonase as you have been doing Continue saline rinses as you have been doing Add Zyrtec or Allegra ( Either is fine) in the morning Continue to monitor temperatures If you develop shortness of breath, or wheezing please seek emergency care If you develop leg swelling seek emergency care. If you develop fever, please find an urgent care in [place] where you are to be seen, or seek emergency care where they can evaluate CXR and labs Follow up with Demere Dotzler NP March 10, 2019 once you are back in [place]. We will consider a CT chest  Once you are back in [place].  OSA Great compliance with therapy AHI 1.1 Plan Continue on BiPAP at bedtime. You appear to be benefiting from the treatment Goal is to wear for at least 6 hours each night for maximal clinical benefit. Continue to work on weight loss, as the link between excess weight  and sleep apnea is well established.  Remember to establish a good bedtime routine, and work on sleep hygiene. Limit daytime naps , avoid stimulants such as caffeine and nicotine close to bedtime, exercise daily to promote sleep quality, avoid heavy , spicy, fried , or rich foods before bed. Ensure adequate exposure to natural light during the day,establish a relaxing bedtime routine with a pleasant sleep environment ( Bedroom between 60 and 67 degrees, turn off bright lights , TV or device screens screens , consider black out curtains or white noise machines) Do not drive if sleepy. Remember to clean mask, tubing, filter,  and reservoir once weekly with soapy water.  Follow up with [name] In 3-4  or before as needed. ( Once back in [place] )  Follow Up Instructions: February 17 th at 2 pm with Eric Form , NP

## 2019-02-10 NOTE — Progress Notes (Addendum)
Virtual Visit via Video Note  I connected with [name] on 02/10/19 at  2:00 PM EST by a video enabled telemedicine application and verified that I am speaking with the correct person using two identifiers.  Location: Patient: At Home ( In [place]) Provider: [place], [place], [place], [address]    I discussed the limitations of evaluation and management by telemedicine and the availability of in person appointments. The patient expressed understanding and agreed to proceed.  57yobfnever smokerwithhistory of chronic cough, COPD/asthmawith recurrent severe cough " since her 40's", OSA on BiPAP, morbid obesity, chronic diastolic heart failure EF 65 to 70%, hypertension, hyperlipidemia IBS with constipation, ulcers, GERD, gastroparesisShe recently tested +9/27/2020for COVID. She was told by her PCP to call Pulmonary if she had any change in color of secretions. She states her secretions have changed to green and she called requesting something be sent in. We are seeing her as a telephone visit prior to prescribing any treatments.She is followed by Dr. Melvyn [name], and Dr. Halford [name]  History of Present Illness: Pt. Presents for follow up. She states she is still having secretions at bedtime. She states these are light yellow. They are thick. She is using Zyrtec D and Robitussin. She states this has helped with her cough. Overall she feels much better.  She states she is using her BiPAP every night.She denies any daytime sleepiness. No morning headaches.  She states she does have to take her mask off frequently with the secretions. She states she is doing well with the exception of this.  She is using her breathing treatments every 4 hours. She is using her flutter valve and IS.  She is compliant with her Singulair every night. She is using her saline rinses and flonase every night. She is still in [place]. She will be back 02/21/2019. She denies any fever, chest pain, orthopnea or  hemoptysis.She is being very cautious using mask and social distancing   Observations/Objective: Down Load 01/11/2019-02/09/2019 Usage 30/30 days > 4 hours 30 days Usage per night >>>  8 hours 45 minutes AirCurve 10 V Auto  IPAP 15 cm H2o EPAP 11 CM H2O AHI 1.1  01/06/2019 >>  SARS CoV-2 Overall Result Not Detected    CXR 11/25/2018 Complete resolution of infiltrate.   11/11/2018 CXR Near complete interval resolution of previously demonstrated heterogeneous airspace opacity of the right midlung and lung base, consistent with improved infection. Cardiomegaly.  10/26/2018 CXR Suspected infiltrate in the right base. Recommend short-term follow-up imaging after treatment to ensure resolution.  CT Sinus 2018 IMPRESSION: Mild mucosal edema in the right maxillary sinus and right sphenoid sinus. Remaining sinuses clear  9/30/2020temperature 99.1 10/21/2018-weight-285 pounds 03/11/2018-office spirometry-FVC 2.6 (78% predicted), ratio 80, FEV1 2 (92% predicted), normal spirometry  Down Load 11/11/2018 AirCurve 10 VAuto IPAP 15 cm H2O EPAP 11 cm H2O Usage 30/30 days > 4 hours 29 days < 4 hours 1 day AHI is 1.8 ( Central 0.1 Obstructive 0.5 Unknown 1.0)    Assessment and Plan: It was great to talk with you today Follow up in 1 month in the office Continue Continue Robitussin  DM as you have been doing for cough Continue using breathing treatments as needed  Continue  pulmonary hygiene management Decrease  flutter valve use 2-3 times daily 4 breaths each time Continue  incentive spirometer >> goal should be to reach thousand Continue Singulair daily as you have been doing Continue nasal saline rinses 2-3 times daily Continue Flonase as  you have been doing Continue saline rinses as you have been doing Add Zyrtec or Allegra ( Either is fine) in the morning Continue to monitor temperatures If you develop shortness of breath, or wheezing please seek emergency  care If you develop leg swelling seek emergency care. If you develop fever, please find an urgent care in [place] where you are to be seen, or seek emergency care where they can evaluate CXR and labs Follow up with [name] March 10, 2019 once you are back in [place]. We will consider a CT chest  Once you are back in [place].  OSA Great compliance with therapy AHI 1.1 Plan Continue on BiPAP at bedtime. You appear to be benefiting from the treatment Goal is to wear for at least 6 hours each night for maximal clinical benefit. Continue to work on weight loss, as the link between excess weight  and sleep apnea is well established.  Remember to establish a good bedtime routine, and work on sleep hygiene. Limit daytime naps , avoid stimulants such as caffeine and nicotine close to bedtime, exercise daily to promote sleep quality, avoid heavy , spicy, fried , or rich foods before bed. Ensure adequate exposure to natural light during the day,establish a relaxing bedtime routine with a pleasant sleep environment ( Bedroom between 60 and 67 degrees, turn off bright lights , TV or device screens screens , consider black out curtains or white noise machines) Do not drive if sleepy. Remember to clean mask, tubing, filter, and reservoir once weekly with soapy water.  Follow up with Charlotte Roch [name] In 3-4  or before as needed. ( Once back in [place] )  Follow Up Instructions: February 17 th at 2 pm with [name] , [name]    I discussed the assessment and treatment plan with the patient. The patient was provided an opportunity to ask questions and all were answered. The patient agreed with the plan and demonstrated an understanding of the instructions.   The patient was advised to call back or seek an in-person evaluation if the symptoms worsen or if the condition fails to improve as anticipated.  I provided 30  minutes of  Care today   Magdalen Spatz, [name] 02/10/2019

## 2019-02-24 ENCOUNTER — Other Ambulatory Visit: Payer: Self-pay | Admitting: Cardiovascular Disease

## 2019-02-24 ENCOUNTER — Ambulatory Visit (INDEPENDENT_AMBULATORY_CARE_PROVIDER_SITE_OTHER): Payer: 59 | Admitting: Cardiovascular Disease

## 2019-02-24 ENCOUNTER — Telehealth: Payer: Self-pay | Admitting: Cardiovascular Disease

## 2019-02-24 ENCOUNTER — Ambulatory Visit (HOSPITAL_COMMUNITY)
Admission: RE | Admit: 2019-02-24 | Discharge: 2019-02-24 | Disposition: A | Discharge status: Home / Self Care | Payer: 59 | Source: Ambulatory Visit | Attending: Cardiology | Admitting: Cardiology

## 2019-02-24 ENCOUNTER — Encounter: Payer: Self-pay | Admitting: Cardiovascular Disease

## 2019-02-24 ENCOUNTER — Other Ambulatory Visit: Payer: Self-pay

## 2019-02-24 VITALS — BP 118/68 | HR 81 | Temp 97.1°F | Ht 63.0 in | Wt 291.0 lb

## 2019-02-24 DIAGNOSIS — R2241 Localized swelling, mass and lump, right lower limb: Secondary | ICD-10-CM

## 2019-02-24 DIAGNOSIS — I5033 Acute on chronic diastolic (congestive) heart failure: Secondary | ICD-10-CM

## 2019-02-24 DIAGNOSIS — J441 Chronic obstructive pulmonary disease with (acute) exacerbation: Secondary | ICD-10-CM

## 2019-02-24 DIAGNOSIS — I10 Essential (primary) hypertension: Secondary | ICD-10-CM

## 2019-02-24 DIAGNOSIS — Z5181 Encounter for therapeutic drug level monitoring: Secondary | ICD-10-CM

## 2019-02-24 DIAGNOSIS — R0602 Shortness of breath: Secondary | ICD-10-CM | POA: Diagnosis not present

## 2019-02-24 DIAGNOSIS — E7800 Pure hypercholesterolemia, unspecified: Secondary | ICD-10-CM

## 2019-02-24 LAB — BASIC METABOLIC PANEL
BUN/Creatinine Ratio: 13 (ref 12–28)
BUN: 13 mg/dL (ref 8–27)
CO2: 22 mmol/L (ref 20–29)
Calcium: 9.3 mg/dL (ref 8.7–10.3)
Chloride: 106 mmol/L (ref 96–106)
Creatinine, Ser: 0.98 mg/dL (ref 0.57–1.00)
GFR calc Af Amer: 73 mL/min/{1.73_m2} (ref >59)
GFR calc non Af Amer: 63 mL/min/{1.73_m2} (ref >59)
Glucose: 110 mg/dL — ABNORMAL HIGH (ref 65–99)
Potassium: 3.5 mmol/L (ref 3.5–5.2)
Sodium: 142 mmol/L (ref 134–144)

## 2019-02-24 NOTE — Patient Instructions (Signed)
Medication Instructions:  Your physician recommends that you continue on your current medications as directed. Please refer to the Current Medication list given to you today.  *If you need a refill on your cardiac medications before your next appointment, please call your pharmacy*  Lab Work: BMET TODAY   If you have labs (blood work) drawn today and your tests are completely normal, you will receive your results only by: [name] MyChart Message (if you have MyChart) OR . A paper copy in the mail If you have any lab test that is abnormal or we need to change your treatment, we will call you to review the results.  Testing/Procedures: Your physician has requested that you have an echocardiogram. Echocardiography is a painless test that uses sound waves to create images of your heart. It provides your doctor with information about the size and shape of your heart and how well your heart's chambers and valves are working. This procedure takes approximately one hour. There are no restrictions for this procedure. [address]  Your physician has requested that you have a lower or upper extremity venous duplex. This test is an ultrasound of the veins in the legs or arms. It looks at venous blood flow that carries blood from the heart to the legs or arms. Allow one hour for a Lower Venous exam. Allow thirty minutes for an Upper Venous exam. There are no restrictions or special instructions. ASAP - RIGHT LEG   Follow-Up: At [facility], you and your health needs are our priority.  As part of our continuing mission to provide you with exceptional heart care, we have created designated Provider Care Teams.  These Care Teams include your primary Cardiologist (physician) and Advanced Practice Providers (APPs -  Physician Assistants and Nurse Practitioners) who all work together to provide you with the care you need, when you need it.  Your next appointment:   3 month(s)  The  format for your next appointment:   Either In Person or Virtual  Provider:   You may see DR [facility]  or one of the following Advanced Practice Providers on your designated Care Team:    Kerin Ransom, PA-C  [place], [place]  Coletta Memos, [place]

## 2019-02-24 NOTE — Telephone Encounter (Signed)
New Message     Pt is returning a call she had gotten     Please call back

## 2019-02-24 NOTE — Telephone Encounter (Signed)
*  STAT* If patient is at the pharmacy, call can be transferred to refill team.   1. Which medications need to be refilled? (please list name of each medication and dose if known)   Potassium Chloride ER 20 MEQ TBCR  spironolactone (ALDACTONE) 25 MG tablet isosorbide dinitrate (ISORDIL) 20 MG tablet  2. Which pharmacy/location (including street and city if local pharmacy) is medication to be sent to? [id number] - [place], [place] - [address]  3. Do they need a 30 day or 90 day supply? 90 day supply

## 2019-02-24 NOTE — Progress Notes (Signed)
Cardiology Office Note   Date:  02/28/2019   ID:  [name], DOB 08/12/1960, MRN [id number]  PCP:  Bartholome Bill, MD  Cardiologist:   Skeet Latch, MD   No chief complaint on file.    History of Present Illness: [name] is a 59 y.o. female with chronic diastolic heart failure, hypertension, hyperlipidemia, OSA on BiPAP, morbid obesity, and COPD who presents for follow up.  [name] was previously a patient of [name].  She last saw him 02/2016 and was doing well.  June 2018 she developed hypotension and worsened renal function so her diuretics were held. She developed acute shortness of breath.  BNP was 31 and cardiac enzymes were negative.  She was diuresed with IV lasix. She followed up with her nephrologist and was started on torsemide 40mg  bid.  At her last appointment she reported palpitations.  She wore a 14-day event monitor 10/2016 that revealed an average heart rate of 87 bpm but episodes of palpitations at which time she was noted to be in sinus tachycardia at 120 bpm.  She also had PACs. She was treated for bronchitis while visiting [place] 01/2017.  Her breathing has improved somewhat since then but she continues to struggle.  Carvedilol was added due to poorly controlled blood pressure.  She also reported exertional dyspnea.  She was referred for an echocardiogram 03/03/2017 that revealed LVEF 65 to 70% with grade 1 diastolic dysfunction.  She also had a Lexiscan Myoview 03/2017 that revealed LVEF 77% and no ischemia.  She called our office with lightheadedness and dizziness.  Her blood pressure at the time was 121/56.  Hydralazine was decreased to 25 mg 3 times daily.  She followed up with Jory Sims, DNP, on 03/2017.  At that time her blood pressure was well-controlled.  She continued to report dizziness despite lack of orthostasis on exam.  She recently returned from [place].  While there she had two flares of bronchitis while there.  She was treated with  doxycycline and prednisone.  At her last appointment her blood pressure was running low when she had not yet taken her medication.  Therefore hydrochlorothiazide was reduced to 12.5 mg.  Since her last appointment she was diagnosed with COVID 09/2018.  She was in [place] and caught it from family members.  She was treated at home with antibiotics and inhalers and did not require hospitalization.  She still chest mucous and chest discomfort.  Her chest discomfort occurs when lying down.  It feels like a sharp apin that is better with deep breaths.  She has no exertional chest pain.  She also struggles with pain and swelling in her R LE.  She has chronic knee pain and is due to have an injection.  However this pain has been worse than usual.  She recently traveled from [place] by car.  She is unsure what her blood pressure has been running)   Past Medical History:  Diagnosis Date  . Acute on chronic diastolic CHF (congestive heart failure) ([place]) 04/11/2014  . Acute suppurative otitis media without spontaneous rupture of eardrum 02/04/2007   Centricity Description: OTITIS MEDIA, SUPPURATIVE, ACUTE, BILATERAL Qualifier: Diagnosis of  By: [name] MD, [name]  Centricity Description: OTITIS MEDIA, SUPPURATIVE, ACUTE Qualifier: Diagnosis of  By: [name] MD, [name]   . Allergic rhinitis   . Allergy   . Anemia   . Anxiety   . Arthritis   . CAD (coronary artery  disease)    LHC (11/29/1999):  EF 60%; no significant CAD.  [name] Carpal tunnel syndrome   . Carpal tunnel syndrome 06/09/2006   Qualifier: Diagnosis of  By: Marca Ancona RMA, Lucy    . Chronic diastolic CHF (congestive heart failure) ([place])    a. Echo (01/21/11): Vigorous LVF, EF 65-70%, no RWMA, Gr 2 DD. b.  Echo (12/15): Moderate LVH, EF 123456, grade 2 diastolic dysfunction, mild LAE, normal RV function c. 01/2015: echo showing EF of 60-65% with mild LVH  . Common migraine   . COPD (chronic obstructive pulmonary disease) ([place])   . Diastolic heart failure  ([place])   . Diverticulitis   . GERD (gastroesophageal reflux disease)   . Hepatic steatosis   . Hepatomegaly   . Hiatal hernia   . History of cardiomegaly   . Hyperlipidemia   . Hypertension   . IBS (irritable bowel syndrome)   . Internal hemorrhoids   . Metabolic syndrome   . Morbid obesity ([place])   . OSA (obstructive sleep apnea)    CPAP dependent  . PNEUMONIA, ORGAN UNSPECIFIED 03/01/2009   Qualifier: Diagnosis of  By: [name] CMA, [name]    . Sinusitis, chronic 03/10/2012   CT sinuses 02/2012:  Chronic sinusitis with small A/F levels >> rx by ENT with prolonged augmentin F/u ct sinuses 04/2012:  Persistent sinus thickening >> rx with levaquin and clinda for 30 days.  Told to return for sinus scan in 6 weeks >> no showed for followup visit.  CT sinuses 11/2013:  Acute and chronic sinusitis noted.    . Sleep apnea    bi pap    Past Surgical History:  Procedure Laterality Date  . ABDOMINAL HYSTERECTOMY    . BIOPSY  06/22/2018   Procedure: BIOPSY;  Surgeon: Mauri Pole, MD;  Location: [facility];  Service: Endoscopy;;  . BIOPSY  07/28/2018   Procedure: BIOPSY;  Surgeon: Mauri Pole, MD;  Location: [facility];  Service: Endoscopy;;  . CESAREAN SECTION    . COLONOSCOPY    . COLONOSCOPY WITH PROPOFOL N/A 05/05/2017   Procedure: COLONOSCOPY WITH PROPOFOL;  Surgeon: Mauri Pole, MD;  Location: [facility];  Service: Endoscopy;  Laterality: N/A;  . ESOPHAGOGASTRODUODENOSCOPY (EGD) WITH PROPOFOL N/A 06/22/2018   Procedure: ESOPHAGOGASTRODUODENOSCOPY (EGD) WITH PROPOFOL;  Surgeon: Mauri Pole, MD;  Location: [facility];  Service: Endoscopy;  Laterality: N/A;  . ESOPHAGOGASTRODUODENOSCOPY (EGD) WITH PROPOFOL N/A 07/28/2018   Procedure: ESOPHAGOGASTRODUODENOSCOPY (EGD) WITH PROPOFOL;  Surgeon: Mauri Pole, MD;  Location: [facility];  Service: Endoscopy;  Laterality: N/A;  . NASAL SINUS SURGERY    . PARTIAL HYSTERECTOMY    . TONSILLECTOMY    . TUBAL  LIGATION    . UVULOPALATOPHARYNGOPLASTY       Current Outpatient Medications  Medication Sig Dispense Refill  . albuterol (PROVENTIL) (2.5 MG/3ML) 0.083% nebulizer solution Take 3 mLs (2.5 mg total) by nebulization every 4 (four) hours as needed for wheezing or shortness of breath. 300 mL 2  . aspirin 81 MG EC tablet TAKE 1 TABLET BY MOUTH EVERY DAY (Patient taking differently: Take 81 mg by mouth daily. ) 90 tablet 3  . benzonatate (TESSALON) 200 MG capsule Take 1 capsule (200 mg total) by mouth 3 (three) times daily as needed for cough. 20 capsule 0  . carvedilol (COREG) 12.5 MG tablet TAKE 1 TABLET BY MOUTH TWICE A DAY WITH FOOD 180 tablet 1  . dexlansoprazole (DEXILANT) 60 MG capsule Take 1 capsule (60 mg total)  by mouth every morning. Take 30 minutes before breakast 90 capsule 3  . doxycycline (VIBRAMYCIN) 100 MG capsule Take 1 capsule (100 mg total) by mouth 2 (two) times daily. 20 capsule 0  . fluticasone (FLONASE) 50 MCG/ACT nasal spray SPRAY 2 SPRAYS INTO EACH NOSTRIL EVERY DAY 48 mL 1  . Guaifenesin (MUCINEX MAXIMUM STRENGTH) 1200 MG TB12 Take 1,200 mg by mouth daily.    . hydrALAZINE (APRESOLINE) 25 MG tablet Take 0.5 tablets (12.5 mg total) by mouth 3 (three) times daily. 270 tablet 1  . levocetirizine (XYZAL) 5 MG tablet Take 5 mg by mouth every evening.    [name] losartan (COZAAR) 50 MG tablet Take 1 tablet (50 mg total) by mouth daily. 90 tablet 1  . lubiprostone (AMITIZA) 24 MCG capsule Take 1 capsule (24 mcg total) by mouth 2 (two) times daily with a meal. 180 capsule 3  . metoCLOPramide (REGLAN) 5 MG tablet TAKE 1 TABLET BY MOUTH TWICE A DAY 60 tablet 0  . metolazone (ZAROXOLYN) 2.5 MG tablet Take 2.5 mg by mouth daily as needed (FOR OVERNIGHT WEIGHT GAIN OF 3 lbs. or more).    . montelukast (SINGULAIR) 10 MG tablet TAKE 1 TABLET(10 MG) BY MOUTH AT BEDTIME (Patient taking differently: Take 10 mg by mouth at bedtime. ) 30 tablet 1  . polyethylene glycol powder (GLYCOLAX/MIRALAX)  powder Use 2 capfuls in the morning and 1 capful in the evening and as directed (Patient taking differently: Take 17-34 g by mouth daily as needed for moderate constipation. ) 527 g 11  . promethazine (PHENERGAN) 25 MG tablet Take 1 tablet (25 mg total) by mouth every 6 (six) hours as needed for nausea or vomiting. 30 tablet 0  . Respiratory Therapy Supplies (FLUTTER) DEVI 1 application by Does not apply route as directed. 1 each 0  . sucralfate (CARAFATE) 1 g tablet Take 1 tablet (1 g total) by mouth 4 (four) times daily -  with meals and at bedtime. 360 tablet 1  . topiramate (TOPAMAX) 25 MG tablet Take 25 mg by mouth 2 (two) times daily as needed (for migraines).     . torsemide (DEMADEX) 20 MG tablet TAKE 2 TABLETS(40 MG) BY MOUTH TWICE DAILY (Patient taking differently: Take 40 mg by mouth 2 (two) times daily. ) 120 tablet 3  . traMADol (ULTRAM) 50 MG tablet Take 1 tablet (50 mg total) by mouth every 6 (six) hours as needed. (Patient taking differently: Take 50 mg by mouth every 6 (six) hours as needed for moderate pain. ) 15 tablet 0  . dicyclomine (BENTYL) 10 MG capsule Take 1 capsule (10 mg total) by mouth 3 (three) times daily before meals for 5 days. (Patient taking differently: Take 10 mg by mouth 4 (four) times daily -  before meals and at bedtime. ) 15 capsule 0  . isosorbide dinitrate (ISORDIL) 20 MG tablet TAKE 1 TABLET BY MOUTH THREE TIMES A DAY 270 tablet 3  . Potassium Chloride ER 20 MEQ TBCR TAKE 3 TABLETS BY MOUTH 3 TIMES A DAY 270 tablet 3  . spironolactone (ALDACTONE) 25 MG tablet Take 0.5 tablets (12.5 mg total) by mouth 2 (two) times daily. 90 tablet 3   No current facility-administered medications for this visit.    Allergies:   Sulfonamide derivatives, Doxycycline, Latex, Ciprofloxacin, Fish allergy, Hydrocortisone, and Neomycin    Social History:  The patient  reports that she has never smoked. She has never used smokeless tobacco. She reports that she does not drink  alcohol or use drugs.   Family History:  The patient's family history includes Asthma in her sister; Breast cancer in an other family member; Colon polyps in her sister; Coronary artery disease in an other family member; Heart attack in her mother; Heart disease in her mother; Hypertension in her mother.    ROS:  Please see the history of present illness.   Otherwise, review of systems are positive for none.   All other systems are reviewed and negative.    PHYSICAL EXAM: VS:  BP 118/68   Pulse 81   Temp (!) 97.1 F (36.2 C)   Ht 5\' 3"  (1.6 m)   Wt 291 lb (132 kg)   SpO2 95%   BMI 51.55 kg/m  , BMI Body mass index is 51.55 kg/m. GENERAL:  Well appearing.  Morbidly obese HEENT: Pupils equal round and reactive, fundi not visualized, oral mucosa unremarkable NECK:  No jugular venous distention, waveform within normal limits, carotid upstroke brisk and symmetric, no bruits, no thyromegaly different LUNGS:  Clear to auscultation bilaterally HEART:  RRR.  PMI not displaced or sustained,S1 and S2 within normal limits, no S3, no S4, no clicks, no rubs, no murmurs ABD:  Flat, positive bowel sounds normal in frequency in pitch, no bruits, no rebound, no guarding, no midline pulsatile mass, no hepatomegaly, no splenomegaly EXT:  2 plus pulses throughout.  Trace R LE edema, no cyanosis no clubbing SKIN:  No rashes no nodules NEURO:  Cranial nerves II through XII grossly intact, motor grossly intact throughout PSYCH:  Cognitively intact, oriented to person place and time   EKG:  EKG is ordered today. The ekg ordered 10/21/16 demonstrates sinus tachycardia.  Rate 104 bpm.   02/18/18: Sinus rhythm.  Rate 67 bpm.  Low voltage. 02/24/19: Sinus bradycardia.  Rate 54 bpm.  Low voltage.  Cannot rule out prior septal infarct  Lexiscan Myoview 03/2017:  Nuclear stress EF: 77%. No wall motion abnormality. Increased LV wall thickness.  The left ventricular ejection fraction is hyperdynamic  (>65%).  There was no ST segment deviation noted during stress.  This is a low risk study. No ischemia identified.  Echo 03/03/17: Study Conclusions  - Left ventricle: The cavity size was normal. There was moderate   concentric hypertrophy. Systolic function was vigorous. The   estimated ejection fraction was in the range of 65% to 70%. Wall   motion was normal; there were no regional wall motion   abnormalities. Doppler parameters are consistent with abnormal   left ventricular relaxation (grade 1 diastolic dysfunction).   Doppler parameters are consistent with elevated ventricular   end-diastolic filling pressure. - Aortic valve: There was no regurgitation. - Mitral valve: There was no regurgitation. - Left atrium: The atrium was normal in size. - Right ventricle: Systolic function was normal. - Right atrium: The atrium was normal in size. - Tricuspid valve: There was no regurgitation. - Pulmonary arteries: Systolic pressure could not be accurately   estimated. - Inferior vena cava: The vessel was normal in size. - Pericardium, extracardiac: There was no pericardial effusion.  Echocardiogram: 02/20/2015 Study Conclusions  - Left ventricle: The cavity size was normal. Wall thickness was increased in a pattern of mild LVH. Systolic function was normal. The estimated ejection fraction was in the range of 60% to 65%.  14-day event Monitor 11/06/16:  Quality: Fair.  Baseline artifact. Predominant rhythm: Sinus rhythm Average heart rate: 87 bpm  Patient reported palpitations at which time sinus tachycardia 120 bpm was  noted PACs also noted  Recent Labs: 06/05/2018: B Natriuretic Peptide 49.0 10/26/2018: ALT 35 11/25/2018: Hemoglobin 11.2; Platelets 316.0 02/24/2019: BUN 13; Creatinine, Ser 0.98; Potassium 3.5; Sodium 142    Lipid Panel    Component Value Date/Time   CHOL 169 02/20/2015 0440   TRIG 310 (H) 02/20/2015 0440   HDL 35 (L) 02/20/2015 0440   CHOLHDL  4.8 02/20/2015 0440   VLDL 62 (H) 02/20/2015 0440   LDLCALC 72 02/20/2015 0440      Wt Readings from Last 3 Encounters:  02/24/19 291 lb (132 kg)  12/09/18 275 lb (124.7 kg)  11/27/18 275 lb (124.7 kg)      ASSESSMENT AND PLAN:  # Chronic diastolic heart failure:  # Hypertension:  # Inappropriate sinus tachycardia: # LE Edema:  BP remains well-controlled after reducing hydralazine.  She is stable.  She has mild R LE edema, pain and recent travel.  We will check LE Dopplers to rule out DVT.  Continue carvedilol, hydralazine, isosorbide dinitrate, losartan, spironolactone, and torsemide.  She has not needed metolazone recently.   # Exertional dyspnea: Lexiscan Myoview and echo were unremarkable other than grade 1 diastolic dysfunction.  Her symptoms are likely due to morbid obesity, deconditioning, and and chronic lung disease.  Compounded by recent COVID-19 infection.  However, given her orthopnea and COVID, we will get an echo to ensure that there are no CV changes.  I have encouraged her to increase her exercise.  If her knees are her limitation then she should join a gym and consider using an exercise bike or water aerobics.  She expressed understanding.  # Morbid obesity: Exercise as above.  # OSA: Continue bipap.   Current medicines are reviewed at length with the patient today.  The patient does not have concerns regarding medicines.  The following changes have been made:  no change  Labs/ tests ordered today include:   Orders Placed This Encounter  Procedures  . Basic metabolic panel  . EKG 12-Lead  . ECHOCARDIOGRAM COMPLETE  . VAS [place] LOWER EXTREMITY VENOUS (DVT)     Disposition:   FU with Dois Juarbe C. Oval Linsey, MD, [facility] in 3 months.     Signed, Valecia Beske C. Oval Linsey, MD, [facility]  02/28/2019 5:06 PM    [place]

## 2019-02-24 NOTE — Telephone Encounter (Signed)
Pt reporting that the office called her but did not leave a message. There is no documentation of a call placed to the patient today. Pt states she has an appt this afternoon and she would see [place] then.

## 2019-02-25 ENCOUNTER — Telehealth: Payer: Self-pay | Admitting: *Deleted

## 2019-02-25 MED ORDER — POTASSIUM CHLORIDE ER 20 MEQ PO TBCR: EXTENDED_RELEASE_TABLET | ORAL | 3 refills | Status: DC

## 2019-02-25 MED ORDER — SPIRONOLACTONE 25 MG PO TABS: 12.5000 mg | ORAL_TABLET | Freq: Two times a day (BID) | ORAL | 3 refills | Status: DC

## 2019-02-25 NOTE — Telephone Encounter (Signed)
Advised patient, verbalized understanding  

## 2019-02-25 NOTE — Telephone Encounter (Deleted)
-----   Message from Skeet Latch, MD sent at 02/24/2019  7:01 PM EST ----- Negative for DVT

## 2019-02-25 NOTE — Telephone Encounter (Signed)
-----   Message from Skeet Latch, MD sent at 02/24/2019  7:01 PM EST ----- Negative for DVT

## 2019-02-25 NOTE — Telephone Encounter (Signed)
Rx has been sent to the pharmacy electronically. ° °

## 2019-02-28 ENCOUNTER — Encounter: Payer: Self-pay | Admitting: Cardiovascular Disease

## 2019-02-28 MED ORDER — METOLAZONE 2.5 MG PO TABS: 2.5000 mg | ORAL_TABLET | Freq: Every day | ORAL | 3 refills | Status: DC | PRN

## 2019-02-28 MED ORDER — ISOSORBIDE DINITRATE 20 MG PO TABS: 20.0000 mg | ORAL_TABLET | Freq: Three times a day (TID) | ORAL | 3 refills | Status: DC

## 2019-03-04 ENCOUNTER — Telehealth: Payer: Self-pay | Admitting: Acute Care

## 2019-03-04 NOTE — Telephone Encounter (Signed)
Pt's upcoming appt is 2/17 at 2pm with SG.  Called and spoke with pt to confirm her upcoming appt info with her and also stated to her that we did have mucinex DM samples that I was going to place up front for her. Pt verbalized understanding. Nothing further needed.

## 2019-03-10 ENCOUNTER — Other Ambulatory Visit: Payer: Self-pay

## 2019-03-10 ENCOUNTER — Encounter: Payer: Self-pay | Admitting: Acute Care

## 2019-03-10 ENCOUNTER — Telehealth: Payer: Self-pay | Admitting: Acute Care

## 2019-03-10 ENCOUNTER — Ambulatory Visit (INDEPENDENT_AMBULATORY_CARE_PROVIDER_SITE_OTHER): Payer: 59 | Admitting: Acute Care

## 2019-03-10 VITALS — BP 130/72 | HR 64 | Temp 97.2°F | Ht 63.0 in | Wt 291.0 lb

## 2019-03-10 DIAGNOSIS — J1282 Pneumonia due to coronavirus disease 2019: Secondary | ICD-10-CM

## 2019-03-10 DIAGNOSIS — U071 COVID-19: Secondary | ICD-10-CM | POA: Diagnosis not present

## 2019-03-10 DIAGNOSIS — J40 Bronchitis, not specified as acute or chronic: Secondary | ICD-10-CM | POA: Diagnosis not present

## 2019-03-10 DIAGNOSIS — J329 Chronic sinusitis, unspecified: Secondary | ICD-10-CM

## 2019-03-10 DIAGNOSIS — G4733 Obstructive sleep apnea (adult) (pediatric): Secondary | ICD-10-CM

## 2019-03-10 LAB — BASIC METABOLIC PANEL
BUN: 21 mg/dL (ref 6–23)
CO2: 29 mEq/L (ref 19–32)
Calcium: 9 mg/dL (ref 8.4–10.5)
Chloride: 104 mEq/L (ref 96–112)
Creatinine, Ser: 1.14 mg/dL (ref 0.40–1.20)
GFR: 58.77 mL/min — ABNORMAL LOW (ref >60.00)
Glucose, Bld: 98 mg/dL (ref 70–99)
Potassium: 3.5 mEq/L (ref 3.5–5.1)
Sodium: 139 mEq/L (ref 135–145)

## 2019-03-10 LAB — CBC WITH DIFFERENTIAL/PLATELET
Basophils Absolute: 0.1 10*3/uL (ref 0.0–0.1)
Basophils Relative: 0.4 % (ref 0.0–3.0)
Eosinophils Absolute: 0.7 10*3/uL (ref 0.0–0.7)
Eosinophils Relative: 4.8 % (ref 0.0–5.0)
HCT: 34.1 % — ABNORMAL LOW (ref 36.0–46.0)
Hemoglobin: 11.2 g/dL — ABNORMAL LOW (ref 12.0–15.0)
Lymphocytes Relative: 26.6 % (ref 12.0–46.0)
Lymphs Abs: 3.6 10*3/uL (ref 0.7–4.0)
MCHC: 32.9 g/dL (ref 30.0–36.0)
MCV: 94.1 fl (ref 78.0–100.0)
Monocytes Absolute: 0.9 10*3/uL (ref 0.1–1.0)
Monocytes Relative: 6.7 % (ref 3.0–12.0)
Neutro Abs: 8.4 10*3/uL — ABNORMAL HIGH (ref 1.4–7.7)
Neutrophils Relative %: 61.5 % (ref 43.0–77.0)
Platelets: 263 10*3/uL (ref 150.0–400.0)
RBC: 3.63 Mil/uL — ABNORMAL LOW (ref 3.87–5.11)
RDW: 13.9 % (ref 11.5–15.5)
WBC: 13.7 10*3/uL — ABNORMAL HIGH (ref 4.0–10.5)

## 2019-03-10 NOTE — Assessment & Plan Note (Deleted)
Chronic cough with increased white thick secretions No fever No distress Plan We will order labs today.( cbc, BMET) We will call you with the results We will order a CT Sinus without contrast We will order a CT Chest without contrast We will call you with results Continue Continue Robitussin  DM as you have been doing for cough Continue using breathing treatments as needed  Continue pulmonary hygiene management Decrease flutter valve use 2-3 times daily 4 breaths each time Continue incentive spirometer >>goal should be to reach thousand Continue Singulair daily as you have been doing Continue nasal saline rinses 2-3 times daily Continue Flonase as you have been doing Add Zyrtec in the morning Follow up after diagnostics are completed in 4 weeks If you develop shortness of breath, or wheezing please seek emergency care Please contact office for sooner follow up if symptoms do not improve or worsen or seek emergency care

## 2019-03-10 NOTE — Patient Instructions (Addendum)
It is good to see you today We will order labs today.( cbc, BMET) We will call you with the results We will order a CT Sinus without contrast We will order a CT Chest without contrast We will call you with results Continue Continue Robitussin  DM as you have been doing for cough Continue using breathing treatments as needed  Continue pulmonary hygiene management Decrease flutter valve use 2-3 times daily 4 breaths each time Continue incentive spirometer >>goal should be to reach thousand Continue Singulair daily as you have been doing Continue nasal saline rinses 2-3 times daily Continue Flonase as you have been doing Add Zyrtec in the morning Follow up after diagnostics are completed in 4 weeks If you develop shortness of breath, or wheezing please seek emergency care Please contact office for sooner follow up if symptoms do not improve or worsen or seek emergency care   OSA Great compliance with therapy AHI 1.4 Plan Continue on BiPAP at bedtime. You appear to be benefiting from the treatment Goal is to wear for at least 6 hours each night for maximal clinical benefit. Continue to work on weight loss, as the link between excess weight and sleep apnea is well established.  Remember to establish a good bedtime routine, and work on sleep hygiene. Limit daytime naps , avoid stimulants such as caffeine and nicotine close to bedtime, exercise daily to promote sleep quality, avoid heavy , spicy, fried , or rich foods before bed. Ensure adequate exposure to natural light during the day,establish a relaxing bedtime routine with a pleasant sleep environment ( Bedroom between 60 and 67 degrees, turn off bright lights , TV or device screens screens , consider black out curtains or white noise machines) Do not drive if sleepy. Remember to clean mask, tubing, filter, and reservoir once weekly with soapy water.

## 2019-03-10 NOTE — Progress Notes (Signed)
History of Present Illness [name] is a 59 y.o. female never smoker with history of chronic cough, COPD/asthmawith recurrent severe cough " since her 30's", OSA on BiPAP, morbid obesity, chronic diastolic heart failure EF 65 to 70%, hypertension, hyperlipidemia IBS with constipation, ulcers, GERD, gastroparesis  She  tested +9/27/2020for COVID 19.She did not require hospitalization.   57yobfnever smokerwithhistory of chronic cough, COPD/asthmawith recurrent severe cough " since her 40's", OSA on BiPAP, morbid obesity, chronic diastolic heart failure EF 65 to 70%, hypertension, hyperlipidemia IBS with constipation, ulcers, GERD, gastroparesisShe recently tested +9/27/2020for COVID. She was told by her PCP to call Pulmonary if she had any change in color of secretions. She states her secretions have changed to green and she called requesting something be sent in. We are seeing her as a telephone visit prior to prescribing any treatments.She is followed by Dr. [name], and Dr. [name]   03/10/2019  Pt. Presents for follow up. She had positive Covid test on 10/18/2018.She did not require hospitalization.  Pt. Was last seen 02/10/2019 via video visit. She has been in [place] with her daughter for several months. At that time she was complaining of secretions at bedtime that were thick and yellow and chronic cough. This has been an ongoing issue with her. She uses Zyrtec and she states she is still having issues with cough and  night time secretions that  are white and thick.She is using Mucinex and Robitussin DM. She has used [employer] in the past, but she states they do not work well.  She states her chest hurts a bit too when she coughs. She states she is compliant with all of her medications. She is using her flutter valve with  her Mucinex. . She states she feels her secretions are coming from her throat. She is compliant with her Dexilant daily She states she is following a  modified GERD diet.. She denies any fever, chest pain, orthopnea or hemoptysis.   Test Results: BiPAP Down Load 02/07/2019-03/08/2019 IPAP 15 cm H2O EPAP 11 cm H2O 100% compliance Average use 7 hours 10 minutes nightly AHI 1.4 No leaks, adequate pressures  01/06/2019 >> SARS CoV-2 Overall Result Not Detected    CXR 11/25/2018 Complete resolution of infiltrate.  11/11/2018 CXR Near complete interval resolution of previously demonstrated heterogeneous airspace opacity of the right midlung and lung base, consistent with improved infection. Cardiomegaly.  10/26/2018 CXR Suspected infiltrate in the right base. Recommend short-term follow-up imaging after treatment to ensure resolution.  CT Sinus 2018 IMPRESSION: Mild mucosal edema in the right maxillary sinus and right sphenoid sinus. Remaining sinuses clear  Spirometry 03/11/2018>>FVC 2.6 (78% predicted), ratio 80, FEV1 2 (92% predicted), normal spirometry   CBC Latest Ref Rng & Units 11/25/2018 11/11/2018 10/26/2018  WBC 4.0 - 10.5 K/uL 10.4 11.7(H) 7.8  Hemoglobin 12.0 - 15.0 g/dL 11.2(L) 11.6(L) 13.2  Hematocrit 36.0 - 46.0 % 33.8(L) 35.8(L) 42.1  Platelets 150.0 - 400.0 K/uL 316.0 241.0 238    BMP Latest Ref Rng & Units 02/24/2019 11/11/2018 10/26/2018  Glucose 65 - 99 mg/dL 110(H) 93 112(H)  BUN 8 - 27 mg/dL 13 27(H) 32(H)  Creatinine 0.57 - 1.00 mg/dL 0.98 0.94 1.07(H)  BUN/Creat Ratio 12 - 28 13 - -  Sodium 134 - 144 mmol/L 142 137 136  Potassium 3.5 - 5.2 mmol/L 3.5 4.1 3.5  Chloride 96 - 106 mmol/L 106 100 106  CO2 20 - 29 mmol/L 22 29 19(L)  Calcium 8.7 - 10.3  mg/dL 9.3 9.7 8.8(L)    BNP    Component Value Date/Time   BNP 49.0 06/05/2018 1719    ProBNP    Component Value Date/Time   PROBNP 25.6 04/11/2013 1008    PFT No results found for: FEV1PRE, FEV1POST, FVCPRE, FVCPOST, TLC, DLCOUNC, PREFEV1FVCRT, PSTFEV1FVCRT  VAS [place] LOWER EXTREMITY VENOUS (DVT)  Result Date: 02/25/2019  Lower Venous  DVT Study Indications: Patient reports having swelling in the right knee and leg x 2 weeks after having multiple falls onto the right knee. She has a history of COPD, and denies any unusual SOB.  Risk Factors: Trauma, fall injuring the right knee twice about 2 weeks ago. Comparison Study: NA Performing Technologist: Sharlett Iles RVT  Examination Guidelines: A complete evaluation includes B-mode imaging, spectral Doppler, color Doppler, and power Doppler as needed of all accessible portions of each vessel. Bilateral testing is considered an integral part of a complete examination. Limited examinations for reoccurring indications may be performed as noted. The reflux portion of the exam is performed with the patient in reverse Trendelenburg.  +---------+---------------+---------+-----------+----------+--------------+ RIGHT    CompressibilityPhasicitySpontaneityPropertiesThrombus Aging +---------+---------------+---------+-----------+----------+--------------+ CFV      Full           Yes      Yes                                 +---------+---------------+---------+-----------+----------+--------------+ SFJ      Full           Yes      Yes                                 +---------+---------------+---------+-----------+----------+--------------+ FV Prox  Full           Yes      Yes                                 +---------+---------------+---------+-----------+----------+--------------+ FV Mid   Full                                                        +---------+---------------+---------+-----------+----------+--------------+ FV DistalFull           Yes      Yes                                 +---------+---------------+---------+-----------+----------+--------------+ PFV      Full           Yes      Yes                                 +---------+---------------+---------+-----------+----------+--------------+ POP      Full           Yes      Yes                                  +---------+---------------+---------+-----------+----------+--------------+ PTV      Full  Yes      Yes                                 +---------+---------------+---------+-----------+----------+--------------+ PERO     Full           Yes      Yes                                 +---------+---------------+---------+-----------+----------+--------------+ Gastroc  Full                                                        +---------+---------------+---------+-----------+----------+--------------+ GSV      Full           Yes      Yes                                 +---------+---------------+---------+-----------+----------+--------------+   +----+---------------+---------+-----------+----------+--------------+ LEFTCompressibilityPhasicitySpontaneityPropertiesThrombus Aging +----+---------------+---------+-----------+----------+--------------+ CFV Full           Yes      Yes                                 +----+---------------+---------+-----------+----------+--------------+ Findings reported to Dr Oval Linsey via Epic messaging at 3:56 pm.  Summary: RIGHT: - No evidence of deep vein thrombosis in the lower extremity. No indirect evidence of obstruction proximal to the inguinal ligament. - No cystic structure found in the popliteal fossa.  LEFT: - No evidence of common femoral vein obstruction.  *See table(s) above for measurements and observations. Electronically signed by Larae Grooms MD on 02/25/2019 at 5:57:01 PM.    Final      Past medical hx Past Medical History:  Diagnosis Date  . Acute on chronic diastolic CHF (congestive heart failure) ([place]) 04/11/2014  . Acute suppurative otitis media without spontaneous rupture of eardrum 02/04/2007   Centricity Description: OTITIS MEDIA, SUPPURATIVE, ACUTE, BILATERAL Qualifier: Diagnosis of  By: [name] MD, [name]  Centricity Description: OTITIS MEDIA, SUPPURATIVE, ACUTE Qualifier: Diagnosis of   By: [name] MD, [name]   . Allergic rhinitis   . Allergy   . Anemia   . Anxiety   . Arthritis   . CAD (coronary artery disease)    LHC (11/29/1999):  EF 60%; no significant CAD.  [name] Carpal tunnel syndrome   . Carpal tunnel syndrome 06/09/2006   Qualifier: Diagnosis of  By: Marca Ancona RMA, Lucy    . Chronic diastolic CHF (congestive heart failure) ([place])    a. Echo (01/21/11): Vigorous LVF, EF 65-70%, no RWMA, Gr 2 DD. b.  Echo (12/15): Moderate LVH, EF 123456, grade 2 diastolic dysfunction, mild LAE, normal RV function c. 01/2015: echo showing EF of 60-65% with mild LVH  . Common migraine   . COPD (chronic obstructive pulmonary disease) ([place])   . Diastolic heart failure ([place])   . Diverticulitis   . GERD (gastroesophageal reflux disease)   . Hepatic steatosis   . Hepatomegaly   . Hiatal hernia   . History of cardiomegaly   . Hyperlipidemia   . Hypertension   . IBS (irritable bowel syndrome)   .  Internal hemorrhoids   . Metabolic syndrome   . Morbid obesity ([place])   . OSA (obstructive sleep apnea)    CPAP dependent  . PNEUMONIA, ORGAN UNSPECIFIED 03/01/2009   Qualifier: Diagnosis of  By: [name] CMA, [name]    . Sinusitis, chronic 03/10/2012   CT sinuses 02/2012:  Chronic sinusitis with small A/F levels >> rx by ENT with prolonged augmentin F/u ct sinuses 04/2012:  Persistent sinus thickening >> rx with levaquin and clinda for 30 days.  Told to return for sinus scan in 6 weeks >> no showed for followup visit.  CT sinuses 11/2013:  Acute and chronic sinusitis noted.    . Sleep apnea    bi pap     Social History   Tobacco Use  . Smoking status: Never Smoker  . Smokeless tobacco: Never Used  Substance Use Topics  . Alcohol use: No  . Drug use: No    [name] reports that she has never smoked. She has never used smokeless tobacco. She reports that she does not drink alcohol or use drugs.  Tobacco Cessation: Never smoker  Past surgical hx, Family hx, Social hx all  reviewed.  Current Outpatient Medications on File Prior to Visit  Medication Sig  . albuterol (PROVENTIL) (2.5 MG/3ML) 0.083% nebulizer solution Take 3 mLs (2.5 mg total) by nebulization every 4 (four) hours as needed for wheezing or shortness of breath.  [name] aspirin 81 MG EC tablet TAKE 1 TABLET BY MOUTH EVERY DAY (Patient taking differently: Take 81 mg by mouth daily. )  . benzonatate (TESSALON) 200 MG capsule Take 1 capsule (200 mg total) by mouth 3 (three) times daily as needed for cough.  . carvedilol (COREG) 12.5 MG tablet TAKE 1 TABLET BY MOUTH TWICE A DAY WITH FOOD  . dexlansoprazole (DEXILANT) 60 MG capsule Take 1 capsule (60 mg total) by mouth every morning. Take 30 minutes before breakast  . fluticasone (FLONASE) 50 MCG/ACT nasal spray SPRAY 2 SPRAYS INTO EACH NOSTRIL EVERY DAY  . Guaifenesin (MUCINEX MAXIMUM STRENGTH) 1200 MG TB12 Take 1,200 mg by mouth daily.  . hydrALAZINE (APRESOLINE) 25 MG tablet Take 0.5 tablets (12.5 mg total) by mouth 3 (three) times daily.  . isosorbide dinitrate (ISORDIL) 20 MG tablet Take 1 tablet (20 mg total) by mouth 3 (three) times daily.  . isosorbide dinitrate (ISORDIL) 20 MG tablet TAKE 1 TABLET BY MOUTH THREE TIMES A DAY  . levocetirizine (XYZAL) 5 MG tablet Take 5 mg by mouth every evening.  [name] losartan (COZAAR) 50 MG tablet Take 1 tablet (50 mg total) by mouth daily.  [name] lubiprostone (AMITIZA) 24 MCG capsule Take 1 capsule (24 mcg total) by mouth 2 (two) times daily with a meal.  . metoCLOPramide (REGLAN) 5 MG tablet TAKE 1 TABLET BY MOUTH TWICE A DAY  . metolazone (ZAROXOLYN) 2.5 MG tablet Take 1 tablet (2.5 mg total) by mouth daily as needed (FOR OVERNIGHT WEIGHT GAIN OF 3 lbs. or more).  . montelukast (SINGULAIR) 10 MG tablet TAKE 1 TABLET(10 MG) BY MOUTH AT BEDTIME (Patient taking differently: Take 10 mg by mouth at bedtime. )  . polyethylene glycol powder (GLYCOLAX/MIRALAX) powder Use 2 capfuls in the morning and 1 capful in the evening and as  directed (Patient taking differently: Take 17-34 g by mouth daily as needed for moderate constipation. )  . Potassium Chloride ER 20 MEQ TBCR TAKE 3 TABLETS BY MOUTH 3 TIMES A DAY  . promethazine (PHENERGAN) 25 MG tablet Take 1 tablet (25  mg total) by mouth every 6 (six) hours as needed for nausea or vomiting.  [name] Respiratory Therapy Supplies (FLUTTER) DEVI 1 application by Does not apply route as directed.  [name] spironolactone (ALDACTONE) 25 MG tablet Take 0.5 tablets (12.5 mg total) by mouth 2 (two) times daily.  . sucralfate (CARAFATE) 1 g tablet Take 1 tablet (1 g total) by mouth 4 (four) times daily -  with meals and at bedtime.  . topiramate (TOPAMAX) 25 MG tablet Take 25 mg by mouth 2 (two) times daily as needed (for migraines).   . torsemide (DEMADEX) 20 MG tablet TAKE 2 TABLETS(40 MG) BY MOUTH TWICE DAILY (Patient taking differently: Take 40 mg by mouth 2 (two) times daily. )  . traMADol (ULTRAM) 50 MG tablet Take 1 tablet (50 mg total) by mouth every 6 (six) hours as needed. (Patient taking differently: Take 50 mg by mouth every 6 (six) hours as needed for moderate pain. )  . dicyclomine (BENTYL) 10 MG capsule Take 1 capsule (10 mg total) by mouth 3 (three) times daily before meals for 5 days. (Patient taking differently: Take 10 mg by mouth 4 (four) times daily -  before meals and at bedtime. )  . [DISCONTINUED] potassium chloride SA (K-DUR,KLOR-CON) 20 MEQ tablet TAKE 3 TABLETS (60 MEQ) BY MOUTH THREE TIMES DAILY (Patient taking differently: Take 180 mEq by mouth daily. )   No current facility-administered medications on file prior to visit.     Allergies  Allergen Reactions  . Sulfonamide Derivatives Anaphylaxis, Swelling, Rash and Other (See Comments)    Swelling tongue and ear   . Doxycycline Nausea And Vomiting  . Latex Hives  . Ciprofloxacin Rash  . Fish Allergy Rash and Other (See Comments)    Only reaction to Mackerel.  No other issues with any type of fish or shellfish  currently   . Hydrocortisone Rash  . Neomycin Rash    Review Of Systems:  Constitutional:   No  weight loss, night sweats,  Fevers, chills, fatigue, or  lassitude.  HEENT:   No headaches,  Difficulty swallowing,  Tooth/dental problems, or  Sore throat,                No sneezing, itching, ear ache, + nasal congestion, + post nasal drip,   CV:  No chest pain,  Orthopnea, PND, swelling in lower extremities, anasarca, dizziness, palpitations, syncope.   GI  No heartburn, indigestion, abdominal pain, nausea, vomiting, diarrhea, change in bowel habits, loss of appetite, bloody stools.   Resp: No shortness of breath with exertion or at rest.  + excess mucus, no productive cough,  No non-productive cough,  No coughing up of blood.  No change in color of mucus.  No wheezing.  No chest wall deformity  Skin: no rash or lesions.  GU: no dysuria, change in color of urine, no urgency or frequency.  No flank pain, no hematuria   MS:  No joint pain or swelling.  No decreased range of motion.  No back pain.  Psych:  No change in mood or affect. No depression or anxiety.  No memory loss.   Vital Signs BP 130/72 (BP Location: Left Arm, Cuff Size: Large)   Pulse 64   Temp (!) 97.2 F (36.2 C) (Oral)   Ht 5\' 3"  (1.6 m)   Wt 291 lb (132 kg)   SpO2 99%   BMI 51.55 kg/m    Physical Exam:  General- No distress,  A&Ox3, pleasant, very talkative  ENT: No sinus tenderness, TM clear, pale nasal mucosa, no oral exudate,+ post nasal drip, no LAN Cardiac: S1, S2, regular rate and rhythm, no murmur Chest: No wheeze/ No rales/ diminished per bases; no accessory muscle use, no nasal flaring, no sternal retractions Abd.: Soft Non-tender, ND, BS +, Body mass index is 51.55 kg/m. Ext: No clubbing cyanosis, edema Neuro:  normal strength, MAE x 4, A&O x 3 Skin: No rashes, No Lesions, warm and dry, intact Psych: normal mood and behavior   Assessment/Plan Chronic Bronchitis with thick white  secretions Covid 09/2018 No fever Plan We will order labs today.( cbc, BMET) We will call you with the results We will order a CT Sinus without contrast We will order a CT Chest without contrast We will call you with results Continue Continue Robitussin  DM as you have been doing for cough Continue using breathing treatments as needed  Continue pulmonary hygiene management Decrease flutter valve use 2-3 times daily 4 breaths each time Continue incentive spirometer >>goal should be to reach thousand Continue Singulair daily as you have been doing Continue nasal saline rinses 2-3 times daily Continue Flonase as you have been doing Add Zyrtec in the morning Follow up after diagnostics are completed in 4 weeks If you develop shortness of breath, or wheezing please seek emergency care Please contact office for sooner follow up if symptoms do not improve or worsen or seek emergency care   OSA on BiPAP Great compliance with therapy AHI 1.4 Plan Continue on BiPAP at bedtime. You appear to be benefiting from the treatment Goal is to wear for at least 6 hours each night for maximal clinical benefit. Continue to work on weight loss, as the link between excess weight and sleep apnea is well established.  Remember to establish a good bedtime routine, and work on sleep hygiene. Limit daytime naps , avoid stimulants such as caffeine and nicotine close to bedtime, exercise daily to promote sleep quality, avoid heavy , spicy, fried , or rich foods before bed. Ensure adequate exposure to natural light during the day,establish a relaxing bedtime routine with a pleasant sleep environment ( Bedroom between 60 and 67 degrees, turn off bright lights , TV or device screens screens , consider black out curtains or white noise machines) Do not drive if sleepy. Remember to clean mask, tubing, filter, and reservoir once weekly with soapy water.   This appointment was over 40 minutes long with over 50%  of the time being direct face to face patient care, assessment , plan of care , and follow up,   Magdalen Spatz, NP 03/10/2019  4:18 PM

## 2019-03-10 NOTE — Telephone Encounter (Signed)
Forwarded to you per our conversation in "teams".

## 2019-03-11 ENCOUNTER — Other Ambulatory Visit: Payer: 59

## 2019-03-11 ENCOUNTER — Other Ambulatory Visit: Payer: Self-pay | Admitting: Acute Care

## 2019-03-11 ENCOUNTER — Other Ambulatory Visit (HOSPITAL_COMMUNITY): Payer: 59

## 2019-03-11 DIAGNOSIS — J069 Acute upper respiratory infection, unspecified: Secondary | ICD-10-CM

## 2019-03-11 MED ORDER — AMOXICILLIN-POT CLAVULANATE 875-125 MG PO TABS: 1.0000 | ORAL_TABLET | Freq: Two times a day (BID) | ORAL | 0 refills | Status: AC

## 2019-03-11 NOTE — Progress Notes (Signed)
I have called in Augmentin 875 mg BID x 7 days. She confirmed that she has taken this antibiotic in the past without any issues. Please schedule her for a follow up with me at first available appointment in about 2 weeks. Thanks so much

## 2019-03-15 ENCOUNTER — Telehealth: Payer: Self-pay | Admitting: Gastroenterology

## 2019-03-15 NOTE — Telephone Encounter (Signed)
Pt stated that she would like to discuss an appointment with you.

## 2019-03-15 NOTE — Telephone Encounter (Signed)
Jonelle Sidle, did you speak with pt? Does triage need to help?

## 2019-03-15 NOTE — Telephone Encounter (Signed)
[place]. She has returned. Her abdomen has been bothering her. She wants to schedule her follow up appointment. See the procedure note from the EGD for further details.

## 2019-03-17 ENCOUNTER — Encounter: Payer: Self-pay | Admitting: Acute Care

## 2019-03-17 ENCOUNTER — Ambulatory Visit (INDEPENDENT_AMBULATORY_CARE_PROVIDER_SITE_OTHER): Payer: 59 | Admitting: Acute Care

## 2019-03-17 ENCOUNTER — Telehealth: Payer: Self-pay | Admitting: Acute Care

## 2019-03-17 DIAGNOSIS — Z9989 Dependence on other enabling machines and devices: Secondary | ICD-10-CM

## 2019-03-17 DIAGNOSIS — U071 COVID-19: Secondary | ICD-10-CM | POA: Diagnosis not present

## 2019-03-17 DIAGNOSIS — G4733 Obstructive sleep apnea (adult) (pediatric): Secondary | ICD-10-CM | POA: Diagnosis not present

## 2019-03-17 DIAGNOSIS — J329 Chronic sinusitis, unspecified: Secondary | ICD-10-CM

## 2019-03-17 NOTE — Patient Instructions (Signed)
It was good to talk with you today CT Chest and Sinuses 03/18/2019 as scheduled We will call you with the results ContinueContinueRobitussinDM as you have been doing for cough Continue using breathing treatments as needed  Continue pulmonary hygiene management Decrease flutter valve use 2-3 times daily4breaths each time Continue incentive spirometer >>goal should be to reach thousand Continue Singulairdaily as you have been doing Continue nasal saline rinses 2-3 times daily Continue Flonaseas you have been doing Add Zyrtec in the morning Follow up after diagnostics are completed in 2 weeks If you develop shortness of breath, or wheezing please seek emergency care Please contact office for sooner follow up if symptoms do not improve or worsen or seek emergency care

## 2019-03-17 NOTE — Telephone Encounter (Signed)
Please schedule for a tele visit today with me . Thanks

## 2019-03-17 NOTE — Telephone Encounter (Signed)
Called and spoke with Patient.  Patient stated she finished her Augmentin today, and is not feeling any better.  Patient stated she saw Dr. Luciana Axe, but Dr. Luciana Axe did not treat her, because SG was treating Patient. Patient stated she is schedule CT tomorrow. Patient stated Dr. Luciana Axe said SG may have to extend antibiotic.  Last OV w/ SG- 03/10/19  Instructions    Return in about 4 weeks (around 04/07/2019), or if symptoms worsen or fail to improve. It is good to see you today We will order labs today.( cbc, BMET) We will call you with the results We will order a CT Sinus without contrast We will order a CT Chest without contrast We will call you with results ContinueContinueRobitussinDM as you have been doing for cough Continue using breathing treatments as needed  Continue pulmonary hygiene management Decrease flutter valve use 2-3 times daily4breaths each time Continue incentive spirometer >>goal should be to reach thousand Continue Singulairdaily as you have been doing Continue nasal saline rinses 2-3 times daily Continue Flonaseas you have been doing Add Zyrtec in the morning Follow up after diagnostics are completed in 4 weeks If you develop shortness of breath, or wheezing please seek emergency care Please contact office for sooner follow up if symptoms do not improve or worsen or seek emergency care      Message routed to [name], NP to advise

## 2019-03-17 NOTE — Telephone Encounter (Signed)
Called spoke with patient - televisit scheduled for 1630 today with SG Nothing further needed; will sign off

## 2019-03-17 NOTE — Telephone Encounter (Signed)
Yes, will close encounter.

## 2019-03-17 NOTE — Progress Notes (Signed)
Virtual Visit via Telephone Note  I connected with [name] on 03/17/19 at  4:30 PM EST by telephone and verified that I am speaking with the correct person using two identifiers.  Location: Patient: At home Provider: .[place], [place], [place], [address]   I discussed the limitations, risks, security and privacy concerns of performing an evaluation and management service by telephone and the availability of in person appointments. I also discussed with the patient that there may be a patient responsible charge related to this service. The patient expressed understanding and agreed to proceed.   History of Present Illness: Pt. Presents for follow up. She was seen 03/10/2019 for continued issues with coughing up secretions . She states the secretions were very thick, but have finally become thinner. She states her chest hurts off and on ( pressure) . No radiation of pain. Labs were done at that appointment, and patient had an elevated WBC. She was started on Augmentin for suspected sinusitis. She calls today stating that she is not feeling any better. She has a CT Chest/ CT Sinus ordered for tomorrow.    Observations/Objective: 01/06/2019 >> SARS CoV-2 Overall Result Not Detected    CXR 11/25/2018 Complete resolution of infiltrate.  11/11/2018 CXR Near complete interval resolution of previously demonstrated heterogeneous airspace opacity of the right midlung and lung base, consistent with improved infection. Cardiomegaly.  10/26/2018 CXR Suspected infiltrate in the right base. Recommend short-term follow-up imaging after treatment to ensure resolution.  CT Sinus 2018 IMPRESSION: Mild mucosal edema in the right maxillary sinus and right sphenoid sinus. Remaining sinuses clear  Spirometry 03/11/2018>>FVC 2.6 (78% predicted), ratio 80, FEV1 2 (92% predicted), normal spirometry    Assessment and Plan: Continued Sinus drainage and general malaise post  COVID Plan CT Chest and Sinuses 03/18/2019 as scheduled We will call you with the results ContinueContinueRobitussinDM as you have been doing for cough Continue using breathing treatments as needed  Continue pulmonary hygiene management Decrease flutter valve use 2-3 times daily4breaths each time Continue incentive spirometer >>goal should be to reach thousand Continue Singulairdaily as you have been doing Continue nasal saline rinses 2-3 times daily Continue Flonaseas you have been doing Add Zyrtec in the morning Follow up after diagnostics are completed in 2 weeks If you develop shortness of breath, or wheezing please seek emergency care Please contact office for sooner follow up if symptoms do not improve or worsen or seek emergency care   OSA Compliant with therapy Plan Continue on CPAP at bedtime. You appear to be benefiting from the treatment  Goal is to wear for at least 6 hours each night for maximal clinical benefit. Continue to work on weight loss, as the link between excess weight  and sleep apnea is well established.   Remember to establish a good bedtime routine, and work on sleep hygiene.  Limit daytime naps , avoid stimulants such as caffeine and nicotine close to bedtime, exercise daily to promote sleep quality, avoid heavy , spicy, fried , or rich foods before bed. Ensure adequate exposure to natural light during the day,establish a relaxing bedtime routine with a pleasant sleep environment ( Bedroom between 60 and 67 degrees, turn off bright lights , TV or device screens screens , consider black out curtains or white noise machines) Do not drive if sleepy. Remember to clean mask, tubing, filter, and reservoir once weekly with soapy water.  Follow up with Dr. [name] 4 months or before as needed.    Follow  Up Instructions: Follow up in 2 weeks with [name] or Dr. [name].  Follow up in  4 months with Dr. [name] ( OSA) I discussed the assessment and treatment plan  with the patient. The patient was provided an opportunity to ask questions and all were answered. The patient agreed with the plan and demonstrated an understanding of the instructions.   The patient was advised to call back or seek an in-person evaluation if the symptoms worsen or if the condition fails to improve as anticipated.  I provided 25 minutes of non-face-to-face time during this encounter.   Magdalen Spatz, NP 03/17/2019 5:03 PM

## 2019-03-18 ENCOUNTER — Ambulatory Visit (INDEPENDENT_AMBULATORY_CARE_PROVIDER_SITE_OTHER)
Admission: RE | Admit: 2019-03-18 | Discharge: 2019-03-18 | Disposition: A | Discharge status: Home / Self Care | Payer: 59 | Source: Ambulatory Visit | Attending: Acute Care | Admitting: Acute Care

## 2019-03-18 ENCOUNTER — Other Ambulatory Visit: Payer: Self-pay

## 2019-03-18 DIAGNOSIS — J329 Chronic sinusitis, unspecified: Secondary | ICD-10-CM | POA: Diagnosis not present

## 2019-03-18 DIAGNOSIS — J1282 Pneumonia due to coronavirus disease 2019: Secondary | ICD-10-CM | POA: Diagnosis not present

## 2019-03-18 DIAGNOSIS — U071 COVID-19: Secondary | ICD-10-CM | POA: Diagnosis not present

## 2019-03-19 ENCOUNTER — Telehealth: Payer: Self-pay | Admitting: Acute Care

## 2019-03-19 DIAGNOSIS — J329 Chronic sinusitis, unspecified: Secondary | ICD-10-CM

## 2019-03-19 NOTE — Telephone Encounter (Signed)
Called the patient and made her aware of the response received and recommendation regarding ENT referral that was placed. Patient voiced understanding. Nothing further needed at this time.

## 2019-03-19 NOTE — Telephone Encounter (Signed)
Pt returned call.  [phone].

## 2019-03-19 NOTE — Telephone Encounter (Signed)
Pt is needing results of ct also she is running slight fever

## 2019-03-19 NOTE — Progress Notes (Signed)
Please call patient and let her know her CT chest was normal. Let her know this is good news.  The CT Sinus does not have any obvious issues, but since this has been going on for so long, please refer to ENT. Thanks Tam

## 2019-03-19 NOTE — Telephone Encounter (Signed)
I called and spoke with the pt  She was had televisit with Eric Form, NP 03/17/19  AVS states: It was good to talk with you today CT Chest and Sinuses 03/18/2019 as scheduled We will call you with the results ContinueContinueRobitussinDM as you have been doing for cough Continue using breathing treatments as needed  Continue pulmonary hygiene management Decrease flutter valve use 2-3 times daily4breaths each time Continue incentive spirometer >>goal should be to reach thousand Continue Singulairdaily as you have been doing Continue nasal saline rinses 2-3 times daily Continue Flonaseas you have been doing Add Zyrtec in the morning Follow up after diagnostics are completed in 2 weeks If you develop shortness of breath, or wheezing please seek emergency care Please contact office for sooner follow up if symptoms do not improve or worsen or seek emergency care  She states following all directions and coughing so much her chest is sore and achy  She had fever yesterday all day of 99.9 but today temp is 97.9  She is otherwise feeling the same since spoke with SG  I gave her CT results and referred to ENT per SG's request   Please call patient and let her know her CT chest was normal. Let her know this is good news.  The CT Sinus does not have any obvious issues, but since this has been going on for so long, please refer to ENT. Thanks Tam [name]- do you have any other recs?

## 2019-03-19 NOTE — Telephone Encounter (Signed)
Called pt back and she did not answer- lmtcb

## 2019-03-19 NOTE — Telephone Encounter (Signed)
Chest and Sinus CT neg. Would cont w/ [name] and refer to ENT   Please contact office for sooner follow up if symptoms do not improve or worsen or seek emergency care

## 2019-03-22 ENCOUNTER — Ambulatory Visit: Payer: 59 | Admitting: Gastroenterology

## 2019-03-22 ENCOUNTER — Telehealth: Payer: Self-pay | Admitting: Acute Care

## 2019-03-22 NOTE — Telephone Encounter (Signed)
Pt. Has had these symptoms for a long time. Her CT Chest was normal, CT Sinuses >> referred to ENT. Please schedule her with Dr. [name] this week until she can get to the ENT. Thanks

## 2019-03-22 NOTE — Telephone Encounter (Signed)
Called and spoke with Patient.  Patient stated she is still having a productive cough, with thin yellow phlegm.  Patient stated she called the nurse with her insurance company, and she recommended her calling [name], NP. Patient stated she temp has been 97.9, denies sob, chills, or body aches.  Patient has been using mucinex sinus max, and robitussin mucus and cough over the weekend, with no improvement. Patient was last seen by [name], NP 03/17/19.  Instructions  It was good to talk with you today CT Chest and Sinuses 03/18/2019 as scheduled We will call you with the results ContinueContinueRobitussinDM as you have been doing for cough Continue using breathing treatments as needed  Continue pulmonary hygiene management Decrease flutter valve use 2-3 times daily4breaths each time Continue incentive spirometer >>goal should be to reach thousand Continue Singulairdaily as you have been doing Continue nasal saline rinses 2-3 times daily Continue Flonaseas you have been doing Add Zyrtec in the morning Follow up after diagnostics are completed in 2 weeks If you develop shortness of breath, or wheezing please seek emergency care Please contact office for sooner follow up if symptoms do not improve or worsen or seek emergency care     Message routed to [name], NP to advise

## 2019-03-22 NOTE — Telephone Encounter (Signed)
Spoke with pt, aware of recs.  [facility] has no openings until Friday and pt does not wish to wait that long.  Wishes to see SG specifically.  Scheduled pt to see 3/3 at 4:00.  Nothing further needed at this time- will close encounter.

## 2019-03-23 ENCOUNTER — Ambulatory Visit (HOSPITAL_COMMUNITY): Payer: 59 | Attending: Internal Medicine

## 2019-03-23 ENCOUNTER — Other Ambulatory Visit: Payer: Self-pay

## 2019-03-23 DIAGNOSIS — R0602 Shortness of breath: Secondary | ICD-10-CM | POA: Insufficient documentation

## 2019-03-23 MED ORDER — PERFLUTREN LIPID MICROSPHERE
1.0000 mL | INTRAVENOUS | Status: AC | PRN
  Administered 2019-03-23: 3 mL via INTRAVENOUS

## 2019-03-24 ENCOUNTER — Ambulatory Visit (INDEPENDENT_AMBULATORY_CARE_PROVIDER_SITE_OTHER): Payer: 59 | Admitting: Nurse Practitioner

## 2019-03-24 ENCOUNTER — Encounter: Payer: Self-pay | Admitting: Nurse Practitioner

## 2019-03-24 ENCOUNTER — Ambulatory Visit: Payer: 59 | Admitting: Acute Care

## 2019-03-24 VITALS — BP 128/72 | HR 70 | Temp 98.7°F | Ht 63.0 in | Wt 278.4 lb

## 2019-03-24 DIAGNOSIS — K219 Gastro-esophageal reflux disease without esophagitis: Secondary | ICD-10-CM

## 2019-03-24 DIAGNOSIS — K589 Irritable bowel syndrome without diarrhea: Secondary | ICD-10-CM

## 2019-03-24 DIAGNOSIS — K3184 Gastroparesis: Secondary | ICD-10-CM

## 2019-03-24 NOTE — Patient Instructions (Signed)
If you are age 61 or older, your body mass index should be between 23-30. Your Body mass index is 49.32 kg/m. If this is out of the aforementioned range listed, please consider follow up with your Primary Care Provider.  If you are age 76 or younger, your body mass index should be between 19-25. Your Body mass index is 49.32 kg/m. If this is out of the aformentioned range listed, please consider follow up with your Primary Care Provider.   Try to stop Reglan, if recurrent nausea/vomiting then resume.  Decrease Bentyl to three times daily before meals.  Follow up with Dr. Silverio Decamp on 05/03/19 at 3 pm.  Thank you for choosing me and [facility].   Tye Savoy, NP

## 2019-03-24 NOTE — Progress Notes (Signed)
IMPRESSION and PLAN:    [name] is a 59 y.o. 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  -Normal gastric emptying study in June 2020 -Maybe post-infectious gastroparesis has resolved. Will see if she can stop BID Reglan. Advised to resume same dose for recurrent nausea / vomiting.  # Chronic GERD -Doing well unless forgets to take a dose of Dexilant.   # chronic epigastric pain -gastritis / gastric ulcers on EGDs in June and July 2020. Takes PPI and Carafate ( BID).  Still gets epigastric pain sometimes but carafate tablet dissolved in water helps.       # IBS- constipation -Doing well on Amitiza plus bid miralax - Continues to take Bentyl QID. Patient on numerous medications, I think it is worth exploring if she still needs Bentyl QID. I asked her to reduce dose to [facility] only. Maybe at follow up visit we can make another dose reductionr, especially since Bentyl can be constipating    HPI:    Primary GI: Dr. Silverio Decamp  Chief complaint : post nasal drip causing her to cough up phlegm.   **History comes from the chart and patient  [name] ia a 59 yo female followed here for GERD, IBS-C, chronic abdominal pain and gastroparesis ( probably post-infectious). She is on multiple medications ( GI and other). EGD in November 2012for upper abdominal pain was remarkable for a large phytobezoar. Gastrc emptying study previous June was normal. Patient has been taking BID reglan without nausea / vomiting. No involuntary muscle movements / lip smacking. Eating small frequent meals. GERD symptoms under control as long as she takes [place]. Bowels moving okay on bid amitiza and bid miralax. She has chronic epigastric pain which she says has been aggravated by post-nasal drip  and cough productive of phlegm. She has an appt with an ENT soon.    Review of systems:     No chest pain, no SOB, no fevers, no urinary sx   Past Medical History:  Diagnosis Date  . Acute on chronic diastolic CHF (congestive heart failure) ([place]) 04/11/2014  . Acute suppurative otitis media without spontaneous rupture of eardrum 02/04/2007   Centricity Description: OTITIS MEDIA, SUPPURATIVE, ACUTE, BILATERAL Qualifier: Diagnosis of  By: [name] MD, [name]  Centricity Description: OTITIS MEDIA, SUPPURATIVE, ACUTE Qualifier: Diagnosis of  By: [name] MD, [name]   . Allergic rhinitis   . Allergy   . Anemia   . Anxiety   . Arthritis   . CAD (coronary artery disease)    LHC (11/29/1999):  EF 60%; no significant CAD.  [name] Carpal tunnel syndrome   . Carpal tunnel syndrome 06/09/2006   Qualifier: Diagnosis of  By: Marca Ancona RMA, Lucy    . Chronic diastolic CHF (congestive heart failure) ([place])    a. Echo (01/21/11): Vigorous LVF, EF 65-70%, no RWMA, Gr 2 DD. b.  Echo (12/15): Moderate LVH, EF 123456, grade 2 diastolic dysfunction, mild LAE, normal RV function c. 01/2015: echo showing EF of 60-65% with mild LVH  . Common migraine   . COPD (chronic obstructive pulmonary disease) ([place])   . Diastolic heart failure ([place])   . Diverticulitis   . GERD (gastroesophageal reflux disease)   . Hepatic steatosis   . Hepatomegaly   .  Hiatal hernia   . History of cardiomegaly   . Hyperlipidemia   . Hypertension   . IBS (irritable bowel syndrome)   . Internal hemorrhoids   . Metabolic syndrome   . Morbid obesity ([place])   . OSA (obstructive sleep apnea)    CPAP dependent  . PNEUMONIA, ORGAN UNSPECIFIED 03/01/2009   Qualifier: Diagnosis of  By: [name] CMA, [name]    . Sinusitis, chronic 03/10/2012   CT sinuses 02/2012:  Chronic sinusitis with small A/F levels >> rx by ENT with prolonged augmentin F/u ct sinuses 04/2012:  Persistent sinus thickening >> rx with levaquin and clinda for 30 days.  Told to return for  sinus scan in 6 weeks >> no showed for followup visit.  CT sinuses 11/2013:  Acute and chronic sinusitis noted.    . Sleep apnea    bi pap    Patient's surgical history, family medical history, social history, medications and allergies were all reviewed in Epic   Serum creatinine: 1.14 mg/dL 03/10/19 1504 Estimated creatinine clearance: 67.9 mL/min  Current Outpatient Medications  Medication Sig Dispense Refill  . albuterol (PROVENTIL) (2.5 MG/3ML) 0.083% nebulizer solution Take 3 mLs (2.5 mg total) by nebulization every 4 (four) hours as needed for wheezing or shortness of breath. 300 mL 2  . aspirin 81 MG EC tablet TAKE 1 TABLET BY MOUTH EVERY DAY (Patient taking differently: Take 81 mg by mouth daily. ) 90 tablet 3  . benzonatate (TESSALON) 200 MG capsule Take 1 capsule (200 mg total) by mouth 3 (three) times daily as needed for cough. 20 capsule 0  . carvedilol (COREG) 12.5 MG tablet TAKE 1 TABLET BY MOUTH TWICE A DAY WITH FOOD 180 tablet 1  . dexlansoprazole (DEXILANT) 60 MG capsule Take 1 capsule (60 mg total) by mouth every morning. Take 30 minutes before breakast 90 capsule 3  . fluticasone (FLONASE) 50 MCG/ACT nasal spray SPRAY 2 SPRAYS INTO EACH NOSTRIL EVERY DAY 48 mL 1  . Guaifenesin (MUCINEX MAXIMUM STRENGTH) 1200 MG TB12 Take 1,200 mg by mouth daily.    . hydrALAZINE (APRESOLINE) 25 MG tablet Take 0.5 tablets (12.5 mg total) by mouth 3 (three) times daily. 270 tablet 1  . isosorbide dinitrate (ISORDIL) 20 MG tablet Take 1 tablet (20 mg total) by mouth 3 (three) times daily. 270 tablet 3  . isosorbide dinitrate (ISORDIL) 20 MG tablet TAKE 1 TABLET BY MOUTH THREE TIMES A DAY 270 tablet 3  . levocetirizine (XYZAL) 5 MG tablet Take 5 mg by mouth every evening.    [name] losartan (COZAAR) 50 MG tablet Take 1 tablet (50 mg total) by mouth daily. 90 tablet 1  . lubiprostone (AMITIZA) 24 MCG capsule Take 1 capsule (24 mcg total) by mouth 2 (two) times daily with a meal. 180 capsule 3  .  metoCLOPramide (REGLAN) 5 MG tablet TAKE 1 TABLET BY MOUTH TWICE A DAY 60 tablet 0  . metolazone (ZAROXOLYN) 2.5 MG tablet Take 1 tablet (2.5 mg total) by mouth daily as needed (FOR OVERNIGHT WEIGHT GAIN OF 3 lbs. or more). 90 tablet 3  . montelukast (SINGULAIR) 10 MG tablet TAKE 1 TABLET(10 MG) BY MOUTH AT BEDTIME (Patient taking differently: Take 10 mg by mouth at bedtime. ) 30 tablet 1  . polyethylene glycol powder (GLYCOLAX/MIRALAX) powder Use 2 capfuls in the morning and 1 capful in the evening and as directed (Patient taking differently: Take 17-34 g by mouth daily as needed for moderate constipation. ) 527 g 11  .  Potassium Chloride ER 20 MEQ TBCR TAKE 3 TABLETS BY MOUTH 3 TIMES A DAY 270 tablet 3  . promethazine (PHENERGAN) 25 MG tablet Take 1 tablet (25 mg total) by mouth every 6 (six) hours as needed for nausea or vomiting. 30 tablet 0  . Respiratory Therapy Supplies (FLUTTER) DEVI 1 application by Does not apply route as directed. 1 each 0  . spironolactone (ALDACTONE) 25 MG tablet Take 0.5 tablets (12.5 mg total) by mouth 2 (two) times daily. 90 tablet 3  . sucralfate (CARAFATE) 1 g tablet Take 1 tablet (1 g total) by mouth 4 (four) times daily -  with meals and at bedtime. 360 tablet 1  . topiramate (TOPAMAX) 25 MG tablet Take 25 mg by mouth 2 (two) times daily as needed (for migraines).     . torsemide (DEMADEX) 20 MG tablet TAKE 2 TABLETS(40 MG) BY MOUTH TWICE DAILY (Patient taking differently: Take 40 mg by mouth 2 (two) times daily. ) 120 tablet 3  . traMADol (ULTRAM) 50 MG tablet Take 1 tablet (50 mg total) by mouth every 6 (six) hours as needed. (Patient taking differently: Take 50 mg by mouth every 6 (six) hours as needed for moderate pain. ) 15 tablet 0  . dicyclomine (BENTYL) 10 MG capsule Take 1 capsule (10 mg total) by mouth 3 (three) times daily before meals for 5 days. (Patient taking differently: Take 10 mg by mouth 4 (four) times daily -  before meals and at bedtime. ) 15  capsule 0   No current facility-administered medications for this visit.    Physical Exam:     BP 128/72   Pulse 70   Temp 98.7 F (37.1 C)   Ht 5\' 3"  (1.6 m)   Wt 278 lb 6.4 oz (126.3 kg)   SpO2 98%   BMI 49.32 kg/m   GENERAL:  Pleasant female in NAD PSYCH: : Cooperative, normal affect CARDIAC:  RRR,  no peripheral edema PULM: Normal respiratory effort, lungs CTA bilaterally, no wheezing ABDOMEN:  Nondistended, soft, nontender. No obvious masses, no hepatomegaly,  normal bowel sounds SKIN:  turgor, no lesions seen Musculoskeletal:  Normal muscle tone, normal strength NEURO: Alert and oriented x 3, no focal neurologic deficits  I spent 30 minutes total reviewing records, obtaining history, performing exam, counseling patient and documenting visit / findings.   Tye Savoy , NP 03/24/2019, 2:42 PM

## 2019-03-25 NOTE — Progress Notes (Signed)
These results were shared with the patient at [place]. She verbalized understanding. She has been referred to ENT

## 2019-03-27 ENCOUNTER — Other Ambulatory Visit: Payer: Self-pay | Admitting: Gastroenterology

## 2019-04-01 ENCOUNTER — Telehealth: Payer: Self-pay | Admitting: *Deleted

## 2019-04-01 NOTE — Telephone Encounter (Signed)
Advised patient, verbalized understanding. Patient will call if any shortness of breath or swelling

## 2019-04-01 NOTE — Telephone Encounter (Addendum)
-----   Message from Skeet Latch, MD sent at 04/01/2019 12:30 PM EST ----- OK.  Please reduce torsemide to 40mg  in the am and 20mg  in the afternoon.   04/01/2019 11:58 AM EST    Advised patient. Per patient she has not used Metolazone in over 6 months. Will forward to Dr Oval Linsey for review    Earvin Hansen, LPN  075-GRM 075-GRM PM EST    Released in my chart with Dr Blenda Mounts comments attached    Skeet Latch, MD  03/26/2019 1:41 PM EST    Echo shows that her heart is squeezing well. If anything it looks like she is try. Try to avoid metoazone, as puling off more fluid will likely hurt her kidneys. It shows that her heart muscle is thickened, so it will important to keep her BP controlled.

## 2019-04-02 NOTE — Progress Notes (Signed)
Reviewed and agree with documentation and assessment and plan. K. Veena Zayvion Stailey , MD   

## 2019-04-03 IMAGING — CR DG KNEE COMPLETE 4+V*R*
4 series · 4 of 4 positions shown · non-contrast
Comparison: None

CLINICAL DATA: Knee pain.

EXAM:
RIGHT KNEE - COMPLETE 4+ VIEW

[x knee ap right (1 of 3)]
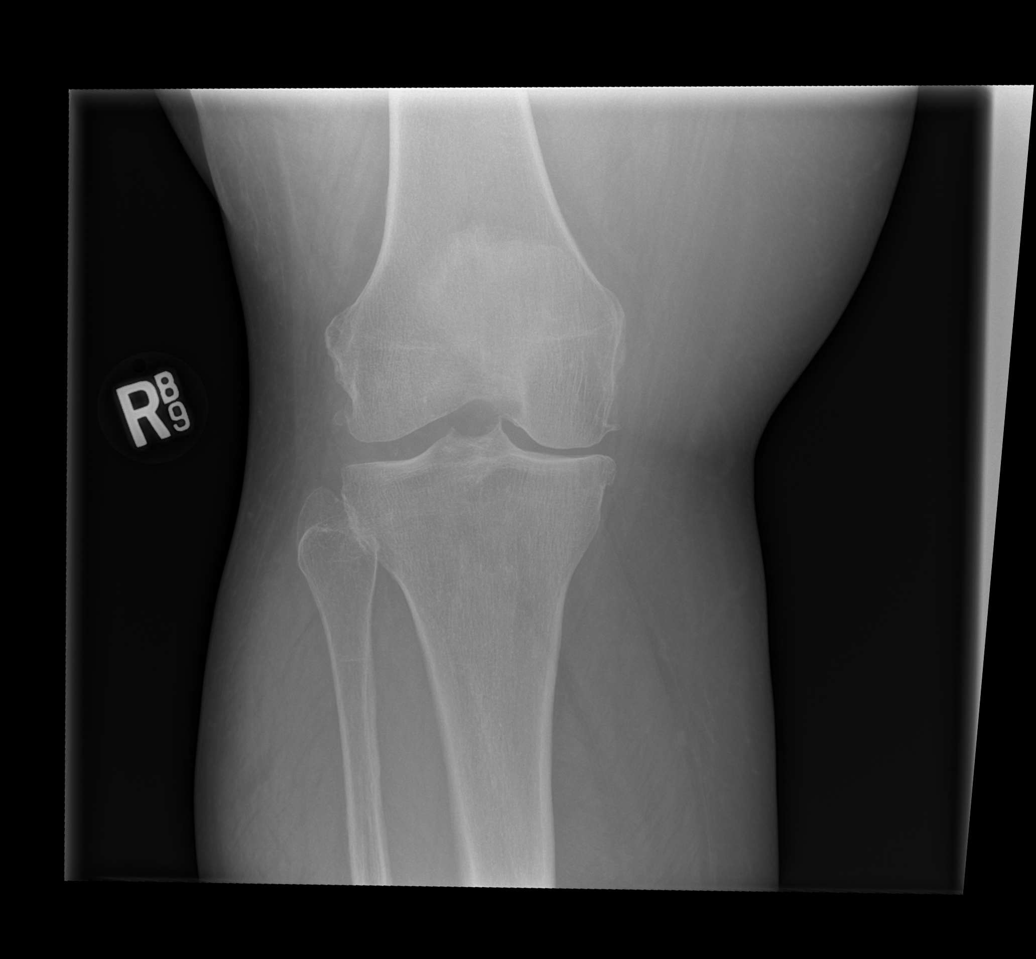

[x knee ap right (2 of 3)]
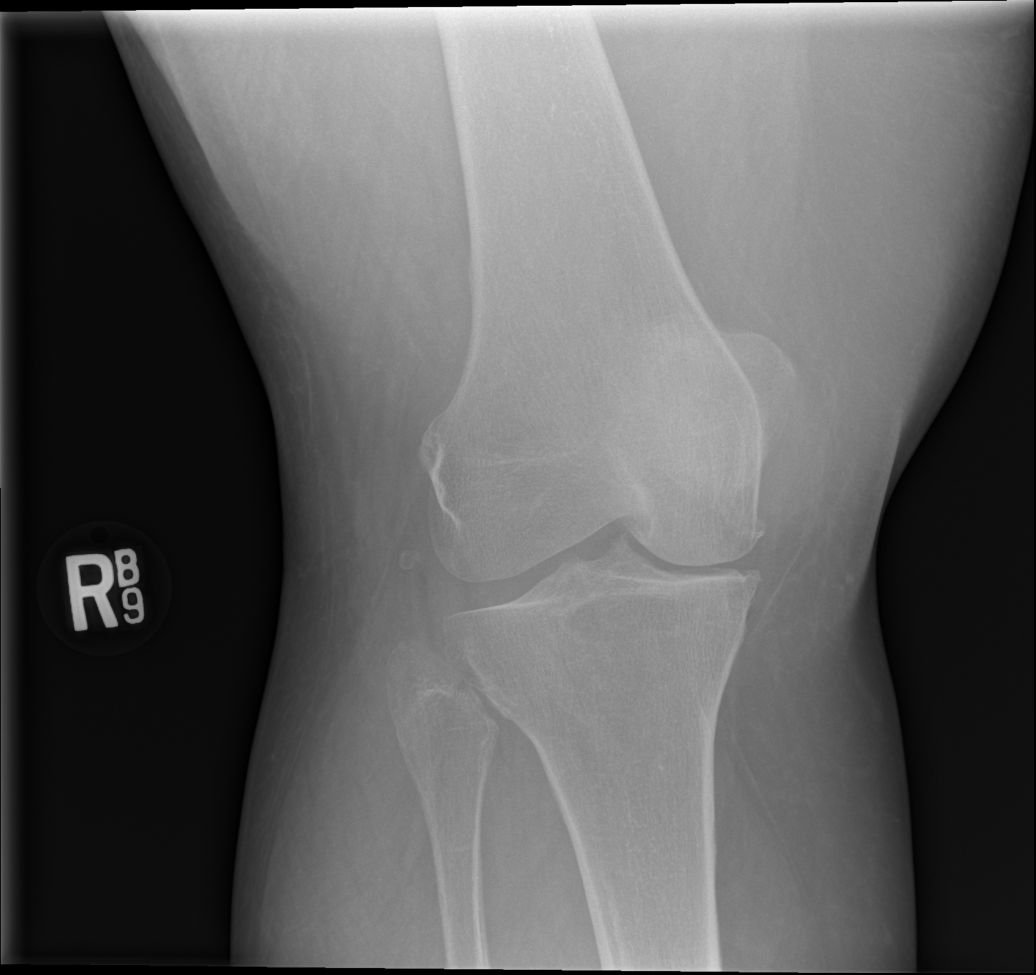

[x knee ap right (3 of 3)]
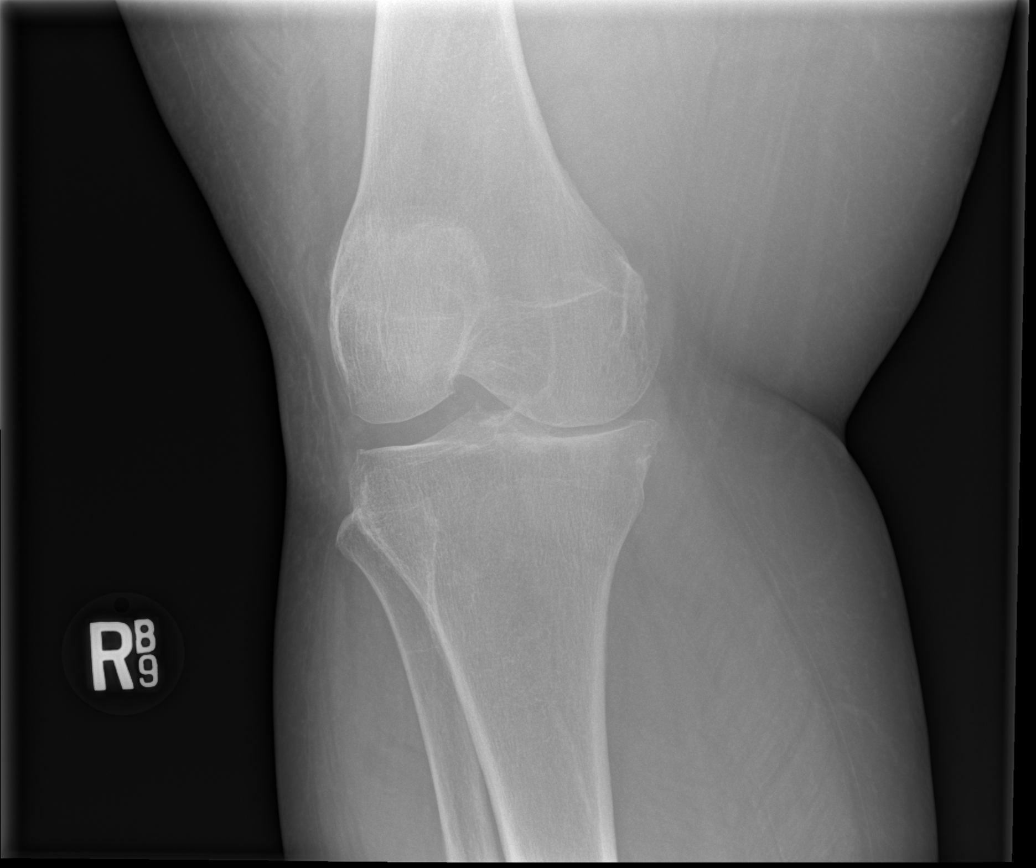

[x knee lat right]
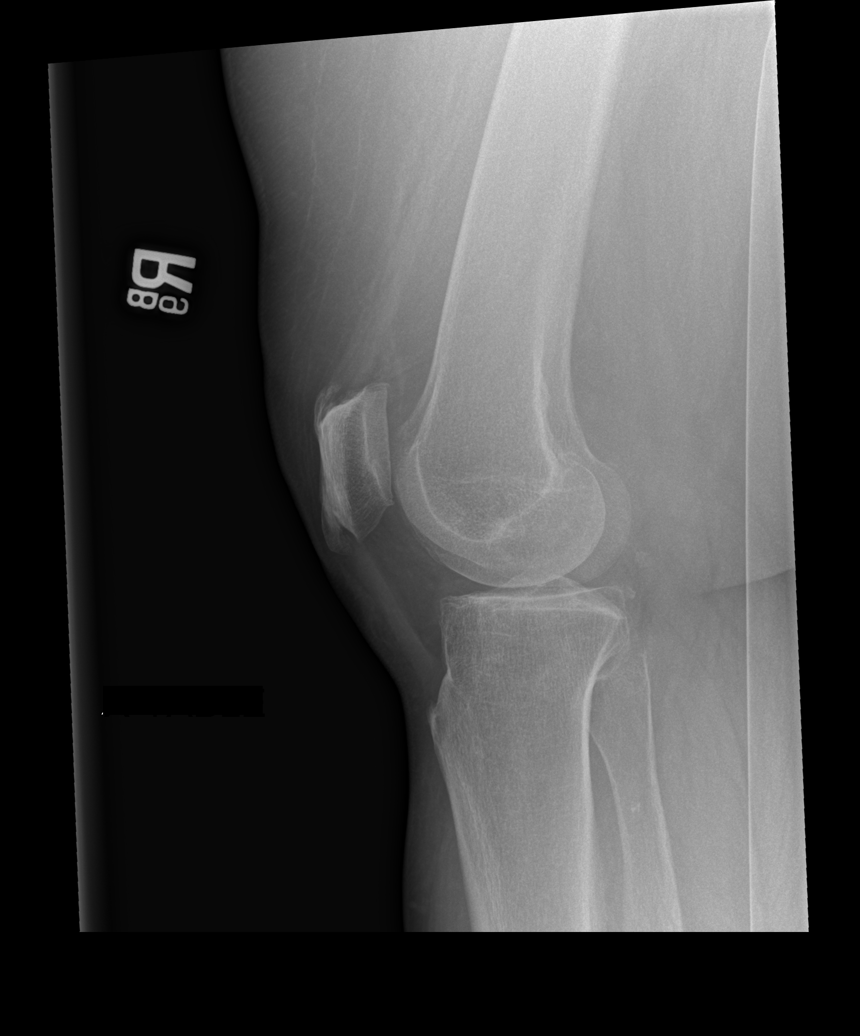

[4 of 4 positions shown; findings below may reference images not displayed]

FINDINGS: Generalized osteopenia. No acute fracture or dislocation.
Mild-moderate right medial femorotibial compartment joint space
narrowing with small marginal osteophytes. No significant joint
effusion.
IMPRESSION: 1.  No acute osseous injury of the right knee.
2. Mild-moderate osteoarthritis of the medial femorotibial
compartment.

## 2019-04-26 ENCOUNTER — Other Ambulatory Visit: Payer: Self-pay | Admitting: Gastroenterology

## 2019-05-03 ENCOUNTER — Ambulatory Visit: Payer: 59 | Admitting: Gastroenterology

## 2019-05-07 ENCOUNTER — Other Ambulatory Visit: Payer: Self-pay | Admitting: Family Medicine

## 2019-05-07 DIAGNOSIS — Z1231 Encounter for screening mammogram for malignant neoplasm of breast: Secondary | ICD-10-CM

## 2019-05-10 ENCOUNTER — Other Ambulatory Visit: Payer: Self-pay

## 2019-05-10 ENCOUNTER — Ambulatory Visit
Admission: RE | Admit: 2019-05-10 | Discharge: 2019-05-10 | Disposition: A | Discharge status: Home / Self Care | Payer: 59 | Source: Ambulatory Visit | Attending: Family Medicine | Admitting: Family Medicine

## 2019-05-10 DIAGNOSIS — Z1231 Encounter for screening mammogram for malignant neoplasm of breast: Secondary | ICD-10-CM

## 2019-05-14 ENCOUNTER — Other Ambulatory Visit: Payer: Self-pay | Admitting: Otolaryngology

## 2019-05-17 ENCOUNTER — Other Ambulatory Visit: Payer: Self-pay | Admitting: Cardiovascular Disease

## 2019-05-17 MED ORDER — TORSEMIDE 20 MG PO TABS: 40.0000 mg | ORAL_TABLET | Freq: Two times a day (BID) | ORAL | 1 refills | Status: DC

## 2019-05-17 NOTE — Telephone Encounter (Signed)
Rx request sent to pharmacy.  

## 2019-05-17 NOTE — Telephone Encounter (Signed)
*  STAT* If patient is at the pharmacy, call can be transferred to refill team.   1. Which medications need to be refilled? (please list name of each medication and dose if known)  Torsemide- need this today please- she does not have any more  2. Which pharmacy/location (including street and city if local pharmacy) is medication to be sent to? [facility] [place] [address], [place]  . Do they need a 30 day or 90 day supply?90 and refills

## 2019-05-17 NOTE — Telephone Encounter (Signed)
Patient is calling to follow up in regards to torsemide (DEMADEX) 20 MG tablet refill request. She states she is completely out of medication. Please assist.

## 2019-05-23 ENCOUNTER — Other Ambulatory Visit: Payer: Self-pay | Admitting: Pulmonary Disease

## 2019-05-24 NOTE — Progress Notes (Signed)
[id number] [address], [place] [address] Phone: [phone] Fax: [place], [place] AT [address] [place] [address] Phone: [phone] Fax: [phone]  [id number] [place], [place] [address] [place] [address] Phone: [phone] Fax: [phone]  [facility] [place], [place] - [place] AT [address] [place] [address] Phone: [phone] Fax: [phone]      Your procedure is scheduled on May 7  Report to [facility] Main Entrance "A" at 0530 A.M., and check in at the Admitting office.  Call this number if you have problems the morning of surgery:  [phone]  Call [phone] if you have any questions prior to your surgery date Monday-Friday 8am-4pm    Remember:  Do not eat or drink after midnight the night before your surgery    Take these medicines the morning of surgery with A SIP OF WATER  albuterol (PROVENTIL) (2.5 MG/3ML) (nebulizer) if needed carvedilol (COREG dexlansoprazole (DEXILANT dicyclomine (BENTYL fluticasone (FLONASE) if needed hydrALAZINE (APRESOLINE) montelukast (SINGULAIR if needed topiramate (TOPAMAX) if needed traMADol (ULTRAM)  If needed for pain  Follow your surgeon's instructions on when to stop Aspirin.  If no instructions were given by your surgeon then you will need to call the office to get those instructions.    As of today, STOP taking any Aleve, Naproxen, Ibuprofen, Motrin, Advil, Goody's, BC's, all herbal medications, fish oil, and all vitamins.                      Do not wear jewelry, make up, or nail polish            Do not wear lotions, powders, perfumes/colognes, or deodorant.            Do not shave 48 hours prior to surgery.             Do not bring  valuables to the hospital.            [facility] is not responsible for any belongings or valuables.  Do NOT Smoke (Tobacco/Vapping) or drink Alcohol 24 hours prior to your procedure If you use a CPAP at night, you may bring all equipment for your overnight stay.   Contacts, glasses, dentures or bridgework may not be worn into surgery.      For patients admitted to the hospital, discharge time will be determined by your treatment team.   Patients discharged the day of surgery will not be allowed to drive home, and someone needs to stay with them for 24 hours.    Special instructions:   [facility]- Preparing For Surgery  Before surgery, you can play an important role. Because skin is not sterile, your skin needs to be as free of germs as possible. You can reduce the number of germs on your skin by washing with CHG (chlorahexidine gluconate) Soap before surgery.  CHG is an antiseptic cleaner which kills germs and bonds with the skin to continue killing germs even after washing.    Oral Hygiene is also important to reduce your risk of infection.  Remember - BRUSH YOUR TEETH THE MORNING OF SURGERY WITH YOUR REGULAR TOOTHPASTE  Please do not use if you have an allergy  to CHG or antibacterial soaps. If your skin becomes reddened/irritated stop using the CHG.  Do not shave (including legs and underarms) for at least 48 hours prior to first CHG shower. It is OK to shave your face.  Please follow these instructions carefully.   1. Shower the NIGHT BEFORE SURGERY and the MORNING OF SURGERY with CHG Soap.   2. If you chose to wash your hair, wash your hair first as usual with your normal shampoo.  3. After you shampoo, rinse your hair and body thoroughly to remove the shampoo.  4. Use CHG as you would any other liquid soap. You can apply CHG directly to the skin and wash gently with a scrungie or a clean washcloth.   5. Apply the CHG Soap to your body ONLY FROM THE NECK DOWN.  Do not use on  open wounds or open sores. Avoid contact with your eyes, ears, mouth and genitals (private parts). Wash Face and genitals (private parts)  with your normal soap.   6. Wash thoroughly, paying special attention to the area where your surgery will be performed.  7. Thoroughly rinse your body with warm water from the neck down.  8. DO NOT shower/wash with your normal soap after using and rinsing off the CHG Soap.  9. Pat yourself dry with a CLEAN TOWEL.  10. Wear CLEAN PAJAMAS to bed the night before surgery, wear comfortable clothes the morning of surgery  11. Place CLEAN SHEETS on your bed the night of your first shower and DO NOT SLEEP WITH PETS.   Day of Surgery:   Do not apply any deodorants/lotions.  Please wear clean clothes to the hospital/surgery center.   Remember to brush your teeth WITH YOUR REGULAR TOOTHPASTE.   Please read over the following fact sheets that you were given.

## 2019-05-25 ENCOUNTER — Encounter (HOSPITAL_COMMUNITY): Payer: Self-pay

## 2019-05-25 ENCOUNTER — Encounter (HOSPITAL_COMMUNITY)
Admission: RE | Admit: 2019-05-25 | Discharge: 2019-05-25 | Disposition: A | Discharge status: Home / Self Care | Payer: 59 | Source: Ambulatory Visit | Attending: Otolaryngology | Admitting: Otolaryngology

## 2019-05-25 ENCOUNTER — Other Ambulatory Visit: Payer: Self-pay

## 2019-05-25 ENCOUNTER — Other Ambulatory Visit (HOSPITAL_COMMUNITY)
Admission: RE | Admit: 2019-05-25 | Discharge: 2019-05-25 | Disposition: A | Discharge status: Home / Self Care | Payer: 59 | Source: Ambulatory Visit | Attending: Otolaryngology | Admitting: Otolaryngology

## 2019-05-25 DIAGNOSIS — Z01812 Encounter for preprocedural laboratory examination: Secondary | ICD-10-CM | POA: Diagnosis not present

## 2019-05-25 DIAGNOSIS — I110 Hypertensive heart disease with heart failure: Secondary | ICD-10-CM | POA: Insufficient documentation

## 2019-05-25 DIAGNOSIS — Z8616 Personal history of COVID-19: Secondary | ICD-10-CM | POA: Insufficient documentation

## 2019-05-25 DIAGNOSIS — I5032 Chronic diastolic (congestive) heart failure: Secondary | ICD-10-CM | POA: Insufficient documentation

## 2019-05-25 DIAGNOSIS — Z20822 Contact with and (suspected) exposure to covid-19: Secondary | ICD-10-CM | POA: Insufficient documentation

## 2019-05-25 DIAGNOSIS — E785 Hyperlipidemia, unspecified: Secondary | ICD-10-CM | POA: Diagnosis not present

## 2019-05-25 DIAGNOSIS — G4733 Obstructive sleep apnea (adult) (pediatric): Secondary | ICD-10-CM | POA: Insufficient documentation

## 2019-05-25 HISTORY — DX: Pneumonia, unspecified organism: J18.9

## 2019-05-25 HISTORY — DX: Dyspnea, unspecified: R06.00

## 2019-05-25 LAB — CBC
HCT: 35 % — ABNORMAL LOW (ref 36.0–46.0)
Hemoglobin: 10.9 g/dL — ABNORMAL LOW (ref 12.0–15.0)
MCH: 30.6 pg (ref 26.0–34.0)
MCHC: 31.1 g/dL (ref 30.0–36.0)
MCV: 98.3 fL (ref 80.0–100.0)
Platelets: 264 10*3/uL (ref 150–400)
RBC: 3.56 MIL/uL — ABNORMAL LOW (ref 3.87–5.11)
RDW: 13.7 % (ref 11.5–15.5)
WBC: 9.2 10*3/uL (ref 4.0–10.5)
nRBC: 0 % (ref 0.0–0.2)

## 2019-05-25 LAB — BASIC METABOLIC PANEL
Anion gap: 6 (ref 5–15)
BUN: 20 mg/dL (ref 6–20)
CO2: 25 mmol/L (ref 22–32)
Calcium: 9.3 mg/dL (ref 8.9–10.3)
Chloride: 109 mmol/L (ref 98–111)
Creatinine, Ser: 1.12 mg/dL — ABNORMAL HIGH (ref 0.44–1.00)
GFR calc Af Amer: >60 mL/min (ref >60)
GFR calc non Af Amer: 53 mL/min — ABNORMAL LOW (ref >60)
Glucose, Bld: 114 mg/dL — ABNORMAL HIGH (ref 70–99)
Potassium: 3.8 mmol/L (ref 3.5–5.1)
Sodium: 140 mmol/L (ref 135–145)

## 2019-05-25 LAB — SARS CORONAVIRUS 2 (TAT 6-24 HRS): SARS Coronavirus 2: NEGATIVE

## 2019-05-25 NOTE — Progress Notes (Signed)
PCP - Dr. Precious Haws Cardiologist - Dr. Foye Deer GI: Dr. Harl Bowie Renal: Dr. Madelon Lips Pulmonologists: Dr. Legrand Como Wert/Dr. Lavell Anchors  PPM/ICD - Denies  Chest x-ray - N/A EKG - 02/24/19 Stress Test - 04/02/17 ECHO - 03/23/19 Cardiac Cath - Denies   Sleep Study - Yes CPAP - Yes  Pt denies being diabetic.   Blood Thinner Instructions: N/A Aspirin Instructions: LD - 05/20/19  ERAS Protcol - No  COVID TEST- 05/25/19   Coronavirus Screening  Have you experienced the following symptoms:  Cough yes/no: No Fever (>100.23F)  yes/no: No Runny nose yes/no: No Sore throat yes/no: No Difficulty breathing/shortness of breath  yes/no: No  Have you or a family member traveled in the last 14 days and where? yes/no: No   If the patient indicates "YES" to the above questions, their PAT will be rescheduled to limit the exposure to others and, the surgeon will be notified. THE PATIENT WILL NEED TO BE ASYMPTOMATIC FOR 14 DAYS.   If the patient is not experiencing any of these symptoms, the PAT nurse will instruct them to NOT bring anyone with them to their appointment since they may have these symptoms or traveled as well.   Please remind your patients and families that hospital visitation restrictions are in effect and the importance of the restrictions.     Anesthesia review: Yes, cardiac hx; previous EKG abnormal  Patient denies shortness of breath, fever, cough and chest pain at PAT appointment   All instructions explained to the patient, with a verbal understanding of the material. Patient agrees to go over the instructions while at home for a better understanding. Patient also instructed to self quarantine after being tested for COVID-19. The opportunity to ask questions was provided.

## 2019-05-26 NOTE — Anesthesia Preprocedure Evaluation (Addendum)
Anesthesia Evaluation  Patient identified by MRN, date of birth, ID band Patient awake    Reviewed: Allergy & Precautions, NPO status , Patient's Chart, lab work & pertinent test results, reviewed documented beta blocker date and time   History of Anesthesia Complications Negative for: history of anesthetic complications  Airway Mallampati: III  TM Distance: >3 FB Neck ROM: Full    Dental  (+) Dental Advisory Given, Chipped   Pulmonary shortness of breath, sleep apnea and Continuous Positive Airway Pressure Ventilation , COPD,  COPD inhaler,  05/25/2019 SARS coronavirus NEG   breath sounds clear to auscultation       Cardiovascular hypertension, Pt. on medications and Pt. on home beta blockers (-) angina+ CAD   Rhythm:Regular Rate:Normal  03/2019 ECHO: EF >75%, valves OK   Neuro/Psych  Headaches, Anxiety    GI/Hepatic Neg liver ROS, GERD  Controlled,  Endo/Other  Morbid obesity  Renal/GU Renal diseasenegative Renal ROS     Musculoskeletal  (+) Arthritis ,   Abdominal (+) + obese,   Peds  Hematology  (+) Blood dyscrasia (Hb 10.9), anemia ,   Anesthesia Other Findings   Reproductive/Obstetrics                           Anesthesia Physical Anesthesia Plan  ASA: III  Anesthesia Plan: General   Post-op Pain Management:    Induction: Intravenous  PONV Risk Score and Plan: 3 and Dexamethasone and Ondansetron  Airway Management Planned: Oral ETT  Additional Equipment:   Intra-op Plan:   Post-operative Plan: Extubation in OR  Informed Consent: I have reviewed the patients History and Physical, chart, labs and discussed the procedure including the risks, benefits and alternatives for the proposed anesthesia with the patient or authorized representative who has indicated his/her understanding and acceptance.     Dental advisory given  Plan Discussed with: CRNA and Surgeon  Anesthesia  Plan Comments: (Follows with cardiology for hx of chronic diastolic heart failure, hypertension, hyperlipidemia, OSA on BiPAP, morbid obesity. Echo 03/03/2017 showed LVEF 65 to 70% with grade 1 diastolic dysfunction.  She also had a Lexiscan Myoview 03/2017 that revealed LVEF 77% and no ischemia. She was last seen by Dr. Oval Linsey 02/24/19. Pt report of exertional dyspnea was discussed. Per note, " Exertional dyspnea: Lexiscan Myoview and echo were unremarkable other than grade 1 diastolic dysfunction.  Her symptoms are likely due to morbid obesity, deconditioning, and chronic lung disease.  Compounded by recent COVID-19 infection.  However, given her orthopnea and COVID, we will get an echo to ensure that there are no CV changes.  I have encouraged her to increase her exercise.  If her knees are her limitation then she should join a gym and consider using an exercise bike or water aerobics.  She expressed understanding." Echo 03/23/19 showed EF >75%, no regional WMA, moderate LVH. Dr. Oval Linsey commented on result stating, "Echo shows that her heart is squeezing well. If anything it looks like she is dry. Try to avoid metolazone, as puling off more fluid will likely hurt her kidneys. It shows that her heart muscle is thickened, so it will important to keep her BP controlled."  Follows with pulmonology for OSA on CPAP, COPD/asthma with recurrent cough. She had normal spirometry on 03/11/18. She has been seen frequently as of late for continued sinus drainage and general malaise post COVID. Recent CT chest was normal. She was referred to ENT for further eval.   She  diagnosed with COVID 09/2018. She was treated at home with antibiotics and inhalers and did not require hospitalization.   Preop labs reviewed, mild anemia Hgb 10.9. Otherwise unremarkable.   EKG 02/24/19: Sinus brady. Rate 54. Low voltage. Cannot rule out prior septal infarct  CT chest 03/18/19: IMPRESSION: No acute findings in the chest, no signs of  consolidation, ground-glass or bronchial wall thickening.  TTE 03/23/19: 1. Left ventricular ejection fraction, by estimation, is >75%. The left  ventricle has hyperdynamic function. The left ventricle has no regional  wall motion abnormalities. There is moderate left ventricular hypertrophy.  Left ventricular diastolic  parameters are indeterminate.  2. Right ventricular systolic function was not well visualized. The right  ventricular size is not well visualized.  3. The mitral valve was not well visualized. No evidence of mitral valve  regurgitation.  4. The aortic valve was not well visualized. Aortic valve regurgitation  is not visualized.  5. The inferior vena cava IVC measures <1.2 cm and spontaenously  collapses, suggesting volume depletion and a low RA pressure <3 mmHg.   Lexiscan Myoview 03/2017: Nuclear stress EF: 77%. No wall motion abnormality. Increased LV wall thickness. The left ventricular ejection fraction is hyperdynamic (>65%). There was no ST segment deviation noted during stress. This is a low risk study. No ischemia identified.)      Anesthesia Quick Evaluation

## 2019-05-26 NOTE — Progress Notes (Signed)
Anesthesia Chart Review:  Follows with cardiology for hx of chronic diastolic heart failure, hypertension, hyperlipidemia, OSA on BiPAP, morbid obesity. Echo 03/03/2017 showed LVEF 65 to 70% with grade 1 diastolic dysfunction.  She also had a Lexiscan Myoview 03/2017 that revealed LVEF 77% and no ischemia. She was last seen by Dr. Oval Linsey 02/24/19. Pt report of exertional dyspnea was discussed. Per note, " Exertional dyspnea: Lexiscan Myoview and echo were unremarkable other than grade 1 diastolic dysfunction.  Her symptoms are likely due to morbid obesity, deconditioning, and chronic lung disease.  Compounded by recent COVID-19 infection.  However, given her orthopnea and COVID, we will get an echo to ensure that there are no CV changes.  I have encouraged her to increase her exercise.  If her knees are her limitation then she should join a gym and consider using an exercise bike or water aerobics.  She expressed understanding." Echo 03/23/19 showed EF >75%, no regional WMA, moderate LVH. Dr. Oval Linsey commented on result stating, "Echo shows that her heart is squeezing well. If anything it looks like she is dry. Try to avoid metolazone, as puling off more fluid will likely hurt her kidneys. It shows that her heart muscle is thickened, so it will important to keep her BP controlled."  Follows with pulmonology for OSA on CPAP, COPD/asthma with recurrent cough. She had normal spirometry on 03/11/18. She has been seen frequently as of late for continued sinus drainage and general malaise post COVID. Recent CT chest was normal. She was referred to ENT for further eval.   She diagnosed with COVID 09/2018. She was treated at home with antibiotics and inhalers and did not require hospitalization.   Preop labs reviewed, mild anemia Hgb 10.9. Otherwise unremarkable.   EKG 02/24/19: Sinus brady. Rate 54. Low voltage. Cannot rule out prior septal infarct  CT chest 03/18/19: IMPRESSION: No acute findings in the chest, no  signs of consolidation, ground-glass or bronchial wall thickening.  TTE 03/23/19: 1. Left ventricular ejection fraction, by estimation, is >75%. The left  ventricle has hyperdynamic function. The left ventricle has no regional  wall motion abnormalities. There is moderate left ventricular hypertrophy.  Left ventricular diastolic  parameters are indeterminate.  2. Right ventricular systolic function was not well visualized. The right  ventricular size is not well visualized.  3. The mitral valve was not well visualized. No evidence of mitral valve  regurgitation.  4. The aortic valve was not well visualized. Aortic valve regurgitation  is not visualized.  5. The inferior vena cava IVC measures <1.2 cm and spontaenously  collapses, suggesting volume depletion and a low RA pressure <3 mmHg.   Lexiscan Myoview 03/2017:  Nuclear stress EF: 77%. No wall motion abnormality. Increased LV wall thickness.  The left ventricular ejection fraction is hyperdynamic (>65%).  There was no ST segment deviation noted during stress.  This is a low risk study. No ischemia identified.   [place], [place] [facility] Short Stay Center/Anesthesiology Phone [phone] 05/26/2019 3:50 PM

## 2019-05-27 ENCOUNTER — Telehealth: Payer: Self-pay | Admitting: Cardiovascular Disease

## 2019-05-27 MED ORDER — DEXTROSE 5 % IV SOLN
3.0000 g | INTRAVENOUS | Status: AC
  Administered 2019-05-28: 3 g via INTRAVENOUS
  Filled 2019-05-27: qty 3000
  Filled 2019-05-27: qty 3

## 2019-05-27 NOTE — Telephone Encounter (Signed)
New message   Patient would like to know if Dr. Blenda Mounts visit can be virtual because the patient is having sinus surgery. Please advise.

## 2019-05-27 NOTE — Telephone Encounter (Signed)
Called left message  That Dr Oval Linsey nurse will contact you if appointment can be changed to virtual on May 11, it may need to go another day that Dr Oval Linsey will be doing virtual .   Nurse is out of the office today .

## 2019-05-28 ENCOUNTER — Encounter (HOSPITAL_COMMUNITY): Payer: Self-pay | Admitting: Otolaryngology

## 2019-05-28 ENCOUNTER — Ambulatory Visit (HOSPITAL_COMMUNITY): Payer: 59 | Admitting: Physician Assistant

## 2019-05-28 ENCOUNTER — Encounter (HOSPITAL_COMMUNITY)
Admission: RE | Disposition: A | Discharge status: Home / Self Care | Payer: Self-pay | Source: Home / Self Care | Attending: Otolaryngology

## 2019-05-28 ENCOUNTER — Other Ambulatory Visit: Payer: Self-pay

## 2019-05-28 ENCOUNTER — Ambulatory Visit (HOSPITAL_COMMUNITY)
Admission: RE | Admit: 2019-05-28 | Discharge: 2019-05-28 | Disposition: A | Discharge status: Home / Self Care | Payer: 59 | Attending: Otolaryngology | Admitting: Otolaryngology

## 2019-05-28 ENCOUNTER — Ambulatory Visit (HOSPITAL_COMMUNITY): Payer: 59 | Admitting: Anesthesiology

## 2019-05-28 DIAGNOSIS — Z7982 Long term (current) use of aspirin: Secondary | ICD-10-CM | POA: Diagnosis not present

## 2019-05-28 DIAGNOSIS — G4733 Obstructive sleep apnea (adult) (pediatric): Secondary | ICD-10-CM | POA: Diagnosis not present

## 2019-05-28 DIAGNOSIS — J449 Chronic obstructive pulmonary disease, unspecified: Secondary | ICD-10-CM | POA: Diagnosis not present

## 2019-05-28 DIAGNOSIS — J329 Chronic sinusitis, unspecified: Secondary | ICD-10-CM | POA: Insufficient documentation

## 2019-05-28 DIAGNOSIS — I110 Hypertensive heart disease with heart failure: Secondary | ICD-10-CM | POA: Insufficient documentation

## 2019-05-28 DIAGNOSIS — F419 Anxiety disorder, unspecified: Secondary | ICD-10-CM | POA: Insufficient documentation

## 2019-05-28 DIAGNOSIS — I5033 Acute on chronic diastolic (congestive) heart failure: Secondary | ICD-10-CM | POA: Insufficient documentation

## 2019-05-28 DIAGNOSIS — Z79899 Other long term (current) drug therapy: Secondary | ICD-10-CM | POA: Insufficient documentation

## 2019-05-28 DIAGNOSIS — Z6841 Body Mass Index (BMI) 40.0 and over, adult: Secondary | ICD-10-CM | POA: Diagnosis not present

## 2019-05-28 DIAGNOSIS — J343 Hypertrophy of nasal turbinates: Secondary | ICD-10-CM | POA: Insufficient documentation

## 2019-05-28 HISTORY — PX: SINUS ENDO WITH FUSION: SHX5329

## 2019-05-28 HISTORY — PX: TURBINATE REDUCTION: SHX6157

## 2019-05-28 SURGERY — REDUCTION, NASAL TURBINATE
Anesthesia: General | Laterality: Left

## 2019-05-28 MED ORDER — LACTATED RINGERS IV SOLN: INTRAVENOUS | Status: DC | PRN

## 2019-05-28 MED ORDER — SCOPOLAMINE 1 MG/3DAYS TD PT72: 1.0000 | MEDICATED_PATCH | Freq: Once | TRANSDERMAL | Status: DC

## 2019-05-28 MED ORDER — SCOPOLAMINE 1 MG/3DAYS TD PT72
MEDICATED_PATCH | TRANSDERMAL | Status: AC
  Administered 2019-05-28: 1.5 mg via TRANSDERMAL
  Filled 2019-05-28: qty 1

## 2019-05-28 MED ORDER — MIDAZOLAM HCL 2 MG/2ML IJ SOLN
INTRAMUSCULAR | Status: AC
  Filled 2019-05-28: qty 2

## 2019-05-28 MED ORDER — MUPIROCIN 2 % EX OINT
TOPICAL_OINTMENT | CUTANEOUS | Status: AC
  Filled 2019-05-28: qty 22

## 2019-05-28 MED ORDER — ALBUTEROL SULFATE HFA 108 (90 BASE) MCG/ACT IN AERS
INHALATION_SPRAY | RESPIRATORY_TRACT | Status: DC | PRN
  Administered 2019-05-28: 4 via RESPIRATORY_TRACT

## 2019-05-28 MED ORDER — ROCURONIUM BROMIDE 10 MG/ML (PF) SYRINGE
PREFILLED_SYRINGE | INTRAVENOUS | Status: DC | PRN
  Administered 2019-05-28: 60 mg via INTRAVENOUS
  Administered 2019-05-28: 10 mg via INTRAVENOUS

## 2019-05-28 MED ORDER — OXYCODONE HCL 5 MG PO TABS
5.0000 mg | ORAL_TABLET | Freq: Once | ORAL | Status: AC | PRN
  Administered 2019-05-28: 5 mg via ORAL

## 2019-05-28 MED ORDER — PROPOFOL 10 MG/ML IV BOLUS
INTRAVENOUS | Status: AC
  Filled 2019-05-28: qty 20

## 2019-05-28 MED ORDER — PROMETHAZINE HCL 25 MG/ML IJ SOLN: 6.2500 mg | INTRAMUSCULAR | Status: DC | PRN

## 2019-05-28 MED ORDER — TRIAMCINOLONE ACETONIDE 40 MG/ML IJ SUSP
INTRAMUSCULAR | Status: AC
  Filled 2019-05-28: qty 5

## 2019-05-28 MED ORDER — FENTANYL CITRATE (PF) 100 MCG/2ML IJ SOLN
25.0000 ug | INTRAMUSCULAR | Status: DC | PRN
  Administered 2019-05-28: 25 ug via INTRAVENOUS

## 2019-05-28 MED ORDER — FENTANYL CITRATE (PF) 250 MCG/5ML IJ SOLN
INTRAMUSCULAR | Status: AC
  Filled 2019-05-28: qty 5

## 2019-05-28 MED ORDER — LIDOCAINE 2% (20 MG/ML) 5 ML SYRINGE
INTRAMUSCULAR | Status: DC | PRN
  Administered 2019-05-28: 40 mg via INTRAVENOUS

## 2019-05-28 MED ORDER — LIDOCAINE-EPINEPHRINE 1 %-1:100000 IJ SOLN
INTRAMUSCULAR | Status: AC
  Filled 2019-05-28: qty 1

## 2019-05-28 MED ORDER — FENTANYL CITRATE (PF) 100 MCG/2ML IJ SOLN
INTRAMUSCULAR | Status: DC | PRN
  Administered 2019-05-28: 100 ug via INTRAVENOUS
  Administered 2019-05-28: 25 ug via INTRAVENOUS

## 2019-05-28 MED ORDER — 0.9 % SODIUM CHLORIDE (POUR BTL) OPTIME
TOPICAL | Status: DC | PRN
  Administered 2019-05-28: 1000 mL

## 2019-05-28 MED ORDER — CEPHALEXIN 500 MG PO CAPS: 500.0000 mg | ORAL_CAPSULE | Freq: Three times a day (TID) | ORAL | 0 refills | Status: AC

## 2019-05-28 MED ORDER — OXYCODONE HCL 5 MG PO TABS
ORAL_TABLET | ORAL | Status: AC
  Filled 2019-05-28: qty 1

## 2019-05-28 MED ORDER — PROPOFOL 10 MG/ML IV BOLUS
INTRAVENOUS | Status: DC | PRN
  Administered 2019-05-28: 200 mg via INTRAVENOUS

## 2019-05-28 MED ORDER — OXYCODONE HCL 5 MG/5ML PO SOLN: 5.0000 mg | Freq: Once | ORAL | Status: AC | PRN

## 2019-05-28 MED ORDER — SUGAMMADEX SODIUM 200 MG/2ML IV SOLN
INTRAVENOUS | Status: DC | PRN
  Administered 2019-05-28: 400 mg via INTRAVENOUS

## 2019-05-28 MED ORDER — FENTANYL CITRATE (PF) 100 MCG/2ML IJ SOLN
INTRAMUSCULAR | Status: AC
  Filled 2019-05-28: qty 2

## 2019-05-28 MED ORDER — SODIUM CHLORIDE 0.9 % IR SOLN
Status: DC | PRN
  Administered 2019-05-28: 1

## 2019-05-28 MED ORDER — MEPERIDINE HCL 25 MG/ML IJ SOLN: 6.2500 mg | INTRAMUSCULAR | Status: DC | PRN

## 2019-05-28 MED ORDER — ACETAMINOPHEN 500 MG PO TABS: 1000.0000 mg | ORAL_TABLET | Freq: Once | ORAL | Status: AC

## 2019-05-28 MED ORDER — OXYMETAZOLINE HCL 0.05 % NA SOLN
NASAL | Status: AC
  Filled 2019-05-28: qty 30

## 2019-05-28 MED ORDER — MIDAZOLAM HCL 2 MG/2ML IJ SOLN: 0.5000 mg | Freq: Once | INTRAMUSCULAR | Status: DC | PRN

## 2019-05-28 MED ORDER — ACETAMINOPHEN 500 MG PO TABS
ORAL_TABLET | ORAL | Status: AC
  Administered 2019-05-28: 1000 mg via ORAL
  Filled 2019-05-28: qty 2

## 2019-05-28 MED ORDER — OXYMETAZOLINE HCL 0.05 % NA SOLN
NASAL | Status: DC | PRN
  Administered 2019-05-28: 1 via TOPICAL

## 2019-05-28 MED ORDER — MUPIROCIN CALCIUM 2 % EX CREA
TOPICAL_CREAM | CUTANEOUS | Status: AC
  Filled 2019-05-28: qty 15

## 2019-05-28 MED ORDER — DEXAMETHASONE SODIUM PHOSPHATE 10 MG/ML IJ SOLN
INTRAMUSCULAR | Status: DC | PRN
  Administered 2019-05-28: 10 mg via INTRAVENOUS

## 2019-05-28 MED ORDER — ONDANSETRON HCL 4 MG/2ML IJ SOLN
INTRAMUSCULAR | Status: DC | PRN
  Administered 2019-05-28: 4 mg via INTRAVENOUS

## 2019-05-28 MED ORDER — LIDOCAINE-EPINEPHRINE 1 %-1:100000 IJ SOLN
INTRAMUSCULAR | Status: DC | PRN
  Administered 2019-05-28: 5 mL

## 2019-05-28 MED ORDER — MIDAZOLAM HCL 5 MG/5ML IJ SOLN
INTRAMUSCULAR | Status: DC | PRN
  Administered 2019-05-28: 2 mg via INTRAVENOUS

## 2019-05-28 MED ORDER — SUCCINYLCHOLINE CHLORIDE 200 MG/10ML IV SOSY
PREFILLED_SYRINGE | INTRAVENOUS | Status: DC | PRN
  Administered 2019-05-28: 200 mg via INTRAVENOUS

## 2019-05-28 SURGICAL SUPPLY — 52 items
ATTRACTOMAT 16X20 MAGNETIC DRP (DRAPES) IMPLANT
BLADE ROTATE RAD 40 4 M4 (BLADE) IMPLANT
BLADE ROTATE TRICUT 4X13 M4 (BLADE) ×3 IMPLANT
BLADE SURG 15 STRL LF DISP TIS (BLADE) IMPLANT
BLADE SURG 15 STRL SS (BLADE)
CANISTER SUCT 3000ML PPV (MISCELLANEOUS) ×6 IMPLANT
COAGULATOR SUCT 8FR VV (MISCELLANEOUS) IMPLANT
COAGULATOR SUCT SWTCH 10FR 6 (ELECTROSURGICAL) IMPLANT
COVER WAND RF STERILE (DRAPES) ×2 IMPLANT
DRAPE HALF SHEET 40X57 (DRAPES) IMPLANT
DRESSING NASAL KENNEDY 3.5X.9 (MISCELLANEOUS) IMPLANT
DRSG NASAL KENNEDY 3.5X.9 (MISCELLANEOUS)
ELECT COATED BLADE 2.86 ST (ELECTRODE) IMPLANT
ELECT REM PT RETURN 9FT ADLT (ELECTROSURGICAL) ×3
ELECTRODE REM PT RTRN 9FT ADLT (ELECTROSURGICAL) ×2 IMPLANT
FILTER ARTHROSCOPY CONVERTOR (FILTER) ×3 IMPLANT
GAUZE SPONGE 2X2 8PLY STRL LF (GAUZE/BANDAGES/DRESSINGS) ×2 IMPLANT
GLOVE BIOGEL M 7.0 STRL (GLOVE) ×6 IMPLANT
GOWN STRL REUS W/ TWL LRG LVL3 (GOWN DISPOSABLE) ×4 IMPLANT
GOWN STRL REUS W/TWL LRG LVL3 (GOWN DISPOSABLE) ×6
IV NS 1000ML (IV SOLUTION) ×3
IV NS 1000ML BAXH (IV SOLUTION) ×2 IMPLANT
KIT BASIN OR (CUSTOM PROCEDURE TRAY) ×3 IMPLANT
KIT TURNOVER KIT B (KITS) ×3 IMPLANT
NDL 18GX1X1/2 (RX/OR ONLY) (NEEDLE) IMPLANT
NDL HYPO 25GX1X1/2 BEV (NEEDLE) ×2 IMPLANT
NEEDLE 18GX1X1/2 (RX/OR ONLY) (NEEDLE) IMPLANT
NEEDLE HYPO 25GX1X1/2 BEV (NEEDLE) ×3 IMPLANT
NS IRRIG 1000ML POUR BTL (IV SOLUTION) ×3 IMPLANT
PAD ARMBOARD 7.5X6 YLW CONV (MISCELLANEOUS) ×6 IMPLANT
PENCIL BUTTON HOLSTER BLD 10FT (ELECTRODE) IMPLANT
SPECIMEN JAR SMALL (MISCELLANEOUS) ×3 IMPLANT
SPLINT NASAL DOYLE BI-VL (GAUZE/BANDAGES/DRESSINGS) ×1 IMPLANT
SPONGE GAUZE 2X2 STER 10/PKG (GAUZE/BANDAGES/DRESSINGS) ×1
SPONGE NEURO XRAY DETECT 1X3 (DISPOSABLE) ×3 IMPLANT
SUT ETHILON 3 0 FSL (SUTURE) IMPLANT
SUT ETHILON 3 0 PS 1 (SUTURE) IMPLANT
SUT PLAIN 4 0 ~~LOC~~ 1 (SUTURE) IMPLANT
SWAB COLLECTION DEVICE MRSA (MISCELLANEOUS) IMPLANT
SWAB CULTURE ESWAB REG 1ML (MISCELLANEOUS) IMPLANT
SYR CONTROL 10ML LL (SYRINGE) ×3 IMPLANT
TOWEL GREEN STERILE FF (TOWEL DISPOSABLE) ×3 IMPLANT
TRACKER ENT INSTRUMENT (MISCELLANEOUS) ×3 IMPLANT
TRACKER ENT PATIENT (MISCELLANEOUS) ×3 IMPLANT
TRAP SPECIMEN MUCOUS 40CC (MISCELLANEOUS) IMPLANT
TRAY ENT MC OR (CUSTOM PROCEDURE TRAY) ×3 IMPLANT
TUBE CONNECTING 12X1/4 (SUCTIONS) ×3 IMPLANT
TUBE SALEM SUMP 16 FR W/ARV (TUBING) ×3 IMPLANT
TUBING EXTENTION W/L.L. (IV SETS) ×3 IMPLANT
TUBING STRAIGHTSHOT EPS 5PK (TUBING) ×3 IMPLANT
WATER STERILE IRR 1000ML POUR (IV SOLUTION) ×3 IMPLANT
WIPE INSTRUMENT VISIWIPE 73X73 (MISCELLANEOUS) ×3 IMPLANT

## 2019-05-28 NOTE — Op Note (Signed)
Operative Note:  ENDOSCOPIC SINUS SURGERY WITH NAVIGATION    INFERIOR TURBINATE REDUCTION  Patient: [place] record number: [id number]  Date:05/28/2019  Pre-operative Indications: 1.  Left Chronic Sinusitis     2.  Bilateral inferior turbinate hypertrophy  Postoperative Indications: Same  Surgical Procedure: 1.  Left endoscopic sinus surgery with intraoperative navigation (fusion) consisting of: Left total ethmoidectomy and left maxillary antrostomy with removal of diseased tissue    2.  Bilateral Inferior Turbinate Reduction  Anesthesia: GET  Surgeon: Delsa Bern, M.D.  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 Vilateral inferior turbinate hypertrophy.  No nasal packing placed.   Brief History: The patient is a 59 y.o. female with a history of chronic sinusitis and turbinate hypertrophy. The patient has been on medical therapy to reduce nasal mucosal edema and infection including antibiotics, saline nasal spray and topical nasal steroids. Despite appropriate medical therapy the patient continues to have ongoing symptoms. Given the patient's history and findings, the above surgical procedures were recommended, risks and benefits were discussed in detail with the patient may understand and agree with our plan for surgery which is scheduled at [facility] under general anesthesia as an outpatient.  Surgical Procedure: The patient is brought to the operating room on 05/28/2019 and placed in supine position on the operating table. General endotracheal anesthesia was established without difficulty. When the patient was adequately anesthetized, surgical timeout was performed with correct identification of the patient and the surgical procedure. The patient's nose was then  injected with 5 cc of 1% lidocaine 1:100,000 dilution epinephrine which was injected in a submucosal fashion. The patient's nose was then packed with Afrin-soaked cottonoid pledgets were left in place for approximately 10 minutes to allow for vasoconstriction and hemostasis.  The [employer] Fusion navigation headgear was applied in anatomic and surgical landmarks were identified and confirmed, navigation was used throughout the sinus component of the surgical procedure.  With the patient prepped draped and prepared for surgery, nasal nasal endoscopy was performed bilaterally.  The patient had polypoid mucosa and purulent drainage in the left middle meatus.  Left endoscopic sinus surgery was undertaken.  Using a 0 degree endoscope and a straight microdebrider, a total ethmoidectomy was performed dissecting from anterior to posterior along the floor of the ethmoid sinus removing bony septations and diseased mucosa.  Using a 45 degree telescope and a curved microdebrider dissection was then carried out along the roof of the ethmoid sinus with navigation, completing a total ethmoidectomy.   The lateral nasal wall was inspected, uncinate process was resected using a through-cutting forcep and the natural ostium of the maxillary sinus was enlarged in a posterior and inferior direction.  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  There was polypoid soft tissue involving the maxillary antrum and anterior ethmoid region which was resected.    Attention was then turned to the inferior turbinates, bilateral inferior turbinate intramural cautery was performed with cautery setting at 28 W.  2 submucosal passes were made in each inferior turbinate.  After completing cautery, anterior vertical incisions were created and overlying soft tissue was elevated, a small amount of turbinate bone was resected.  The turbinates were then outfractured to create a  more patent nasal passageway.  No nasal packing  was placed in the common ethmoid cavity bilaterally.  Surgical sponge count was correct. An oral gastric tube was passed and the stomach contents were aspirated. Patient was awakened from anesthetic and transferred from the operating room to the recovery room in stable condition. There were no complications and blood loss was 100 cc.   Delsa Bern, M.D. [facility] ENT 05/28/2019

## 2019-05-28 NOTE — H&P (Signed)
[name] is an 59 y.o. female.   Chief Complaint: Headache and nasal obstruction HPI: Left sinus obstruction and turbinate hypertrophy  Past Medical History:  Diagnosis Date  . Acute on chronic diastolic CHF (congestive heart failure) ([place]) 04/11/2014  . Acute suppurative otitis media without spontaneous rupture of eardrum 02/04/2007   Centricity Description: OTITIS MEDIA, SUPPURATIVE, ACUTE, BILATERAL Qualifier: Diagnosis of  By: [name] MD, [name]  Centricity Description: OTITIS MEDIA, SUPPURATIVE, ACUTE Qualifier: Diagnosis of  By: [name] MD, [name]   . Allergic rhinitis   . Allergy   . Anemia   . Anxiety   . Arthritis   . CAD (coronary artery disease)    LHC (11/29/1999):  EF 60%; no significant CAD.  [name] Carpal tunnel syndrome   . Carpal tunnel syndrome 06/09/2006   Qualifier: Diagnosis of  By: Marca Ancona RMA, Lucy    . Chronic diastolic CHF (congestive heart failure) ([place])    a. Echo (01/21/11): Vigorous LVF, EF 65-70%, no RWMA, Gr 2 DD. b.  Echo (12/15): Moderate LVH, EF 123456, grade 2 diastolic dysfunction, mild LAE, normal RV function c. 01/2015: echo showing EF of 60-65% with mild LVH  . Common migraine   . COPD (chronic obstructive pulmonary disease) ([place])   . Diastolic heart failure ([place])   . Diverticulitis   . Dyspnea   . GERD (gastroesophageal reflux disease)   . Hepatic steatosis   . Hepatomegaly   . Hiatal hernia   . History of cardiomegaly   . Hyperlipidemia   . Hypertension   . IBS (irritable bowel syndrome)   . Internal hemorrhoids   . Metabolic syndrome   . Morbid obesity ([place])   . OSA (obstructive sleep apnea)    CPAP dependent  . Pneumonia   . PNEUMONIA, ORGAN UNSPECIFIED 03/01/2009   Qualifier: Diagnosis of  By: [name] CMA, [name]    . Sinusitis, chronic 03/10/2012   CT sinuses 02/2012:  Chronic sinusitis with small A/F levels >> rx by ENT with prolonged augmentin F/u ct sinuses 04/2012:  Persistent sinus thickening >> rx with levaquin and clinda for 30  days.  Told to return for sinus scan in 6 weeks >> no showed for followup visit.  CT sinuses 11/2013:  Acute and chronic sinusitis noted.    . Sleep apnea    bi pap    Past Surgical History:  Procedure Laterality Date  . ABDOMINAL HYSTERECTOMY    . BIOPSY  06/22/2018   Procedure: BIOPSY;  Surgeon: Mauri Pole, MD;  Location: [facility];  Service: Endoscopy;;  . BIOPSY  07/28/2018   Procedure: BIOPSY;  Surgeon: Mauri Pole, MD;  Location: [facility];  Service: Endoscopy;;  . CESAREAN SECTION    . COLONOSCOPY    . COLONOSCOPY WITH PROPOFOL N/A 05/05/2017   Procedure: COLONOSCOPY WITH PROPOFOL;  Surgeon: Mauri Pole, MD;  Location: [facility];  Service: Endoscopy;  Laterality: N/A;  . ESOPHAGOGASTRODUODENOSCOPY (EGD) WITH PROPOFOL N/A 06/22/2018   Procedure: ESOPHAGOGASTRODUODENOSCOPY (EGD) WITH PROPOFOL;  Surgeon: Mauri Pole, MD;  Location: [facility];  Service: Endoscopy;  Laterality: N/A;  . ESOPHAGOGASTRODUODENOSCOPY (EGD) WITH PROPOFOL N/A 07/28/2018   Procedure: ESOPHAGOGASTRODUODENOSCOPY (EGD) WITH PROPOFOL;  Surgeon: Mauri Pole, MD;  Location: [facility];  Service: Endoscopy;  Laterality: N/A;  . NASAL SINUS SURGERY    . PARTIAL HYSTERECTOMY    . TONSILLECTOMY    . TUBAL LIGATION    . UVULOPALATOPHARYNGOPLASTY      Family History  Problem Relation  Age of Onset  . Heart disease Mother   . Heart attack Mother   . Hypertension Mother   . Coronary artery disease Other        1st degree relatvie <50  . Breast cancer Other        aunt- ? paternal or maternal  . Asthma Sister   . Colon polyps Sister   . Stroke Neg Hx   . Colon cancer Neg Hx   . Esophageal cancer Neg Hx   . Rectal cancer Neg Hx   . Stomach cancer Neg Hx    Social History:  reports that she has never smoked. She has never used smokeless tobacco. She reports that she does not drink alcohol or use drugs.  Allergies:  Allergies  Allergen Reactions  . Sulfonamide  Derivatives Anaphylaxis, Swelling, Rash and Other (See Comments)    Swelling tongue and ear   . Doxycycline Nausea And Vomiting  . Latex Hives  . Ciprofloxacin Rash  . Fish Allergy Rash and Other (See Comments)    Only reaction to Mackerel.  No other issues with any type of fish or shellfish currently   . Hydrocortisone Rash  . Neomycin Rash    Medications Prior to Admission  Medication Sig Dispense Refill  . albuterol (PROVENTIL) (2.5 MG/3ML) 0.083% nebulizer solution Take 3 mLs (2.5 mg total) by nebulization every 4 (four) hours as needed for wheezing or shortness of breath. 300 mL 2  . aspirin 81 MG EC tablet TAKE 1 TABLET BY MOUTH EVERY DAY (Patient taking differently: Take 81 mg by mouth daily. ) 90 tablet 3  . benzonatate (TESSALON) 200 MG capsule Take 1 capsule (200 mg total) by mouth 3 (three) times daily as needed for cough. 20 capsule 0  . carvedilol (COREG) 12.5 MG tablet TAKE 1 TABLET BY MOUTH TWICE A DAY WITH FOOD (Patient taking differently: Take 12.5 mg by mouth 2 (two) times daily with a meal. ) 180 tablet 1  . dexlansoprazole (DEXILANT) 60 MG capsule Take 1 capsule (60 mg total) by mouth every morning. Take 30 minutes before breakast 90 capsule 3  . dicyclomine (BENTYL) 10 MG capsule Take 1 capsule (10 mg total) by mouth 3 (three) times daily before meals for 5 days. (Patient taking differently: Take 10 mg by mouth 4 (four) times daily -  before meals and at bedtime. ) 15 capsule 0  . fluticasone (FLONASE) 50 MCG/ACT nasal spray SPRAY 2 SPRAYS INTO EACH NOSTRIL EVERY DAY 48 mL 1  . hydrALAZINE (APRESOLINE) 25 MG tablet Take 0.5 tablets (12.5 mg total) by mouth 3 (three) times daily. 270 tablet 1  . isosorbide dinitrate (ISORDIL) 20 MG tablet Take 1 tablet (20 mg total) by mouth 3 (three) times daily. 270 tablet 3  . levocetirizine (XYZAL) 5 MG tablet Take 5 mg by mouth every evening.    [name] losartan (COZAAR) 50 MG tablet Take 1 tablet (50 mg total) by mouth daily. 90 tablet 1   . lubiprostone (AMITIZA) 24 MCG capsule Take 1 capsule (24 mcg total) by mouth 2 (two) times daily with a meal. 180 capsule 3  . metolazone (ZAROXOLYN) 2.5 MG tablet Take 1 tablet (2.5 mg total) by mouth daily as needed (FOR OVERNIGHT WEIGHT GAIN OF 3 lbs. or more). 90 tablet 3  . montelukast (SINGULAIR) 10 MG tablet TAKE 1 TABLET(10 MG) BY MOUTH AT BEDTIME (Patient taking differently: Take 10 mg by mouth at bedtime. ) 30 tablet 1  . Potassium Chloride ER 20  MEQ TBCR TAKE 3 TABLETS BY MOUTH 3 TIMES A DAY (Patient taking differently: Take 60 mEq by mouth in the morning, at noon, and at bedtime. TAKE 3 TABLETS BY MOUTH 3 TIMES A DAY) 270 tablet 3  . spironolactone (ALDACTONE) 25 MG tablet Take 0.5 tablets (12.5 mg total) by mouth 2 (two) times daily. 90 tablet 3  . topiramate (TOPAMAX) 25 MG tablet Take 25 mg by mouth 2 (two) times daily as needed (for migraines).     . torsemide (DEMADEX) 20 MG tablet Take 2 tablets (40 mg total) by mouth 2 (two) times daily. 180 tablet 1  . traMADol (ULTRAM) 50 MG tablet Take 1 tablet (50 mg total) by mouth every 6 (six) hours as needed. (Patient taking differently: Take 50 mg by mouth every 6 (six) hours as needed for moderate pain. ) 15 tablet 0  . isosorbide dinitrate (ISORDIL) 20 MG tablet TAKE 1 TABLET BY MOUTH THREE TIMES A DAY (Patient not taking: Reported on 05/17/2019) 270 tablet 3  . metoCLOPramide (REGLAN) 5 MG tablet TAKE 1 TABLET BY MOUTH TWICE A DAY (Patient not taking: Reported on 05/17/2019) 60 tablet 0  . polyethylene glycol powder (GLYCOLAX/MIRALAX) powder Use 2 capfuls in the morning and 1 capful in the evening and as directed (Patient not taking: Reported on 05/17/2019) 527 g 11  . promethazine (PHENERGAN) 25 MG tablet Take 1 tablet (25 mg total) by mouth every 6 (six) hours as needed for nausea or vomiting. (Patient not taking: Reported on 05/17/2019) 30 tablet 0  . Respiratory Therapy Supplies (FLUTTER) DEVI 1 application by Does not apply route as  directed. 1 each 0  . sucralfate (CARAFATE) 1 g tablet Take 1 tablet (1 g total) by mouth 4 (four) times daily -  with meals and at bedtime. (Patient not taking: Reported on 05/17/2019) 360 tablet 1    No results found. However, due to the size of the patient record, not all encounters were searched. Please check Results Review for a complete set of results. No results found.  Review of Systems  Constitutional: Negative.   HENT: Positive for congestion, sinus pressure and sinus pain.   Respiratory: Positive for apnea.   Cardiovascular: Negative.     Blood pressure 136/68, pulse 69, temperature 98.6 F (37 C), temperature source Oral, resp. rate 17, height 5\' 3"  (1.6 m), weight 129.3 kg, SpO2 99 %. Physical Exam  Constitutional: She appears well-developed and well-nourished.  HENT:  Left sinus obstruction and turbinate hypertrophy  Musculoskeletal:     Cervical back: Normal range of motion and neck supple.     Assessment/Plan Adm for OP Left ESS and IT hypertrophy  [name], MD 05/28/2019, 7:36 AM

## 2019-05-28 NOTE — Anesthesia Procedure Notes (Signed)
Procedure Name: Intubation Date/Time: 05/28/2019 7:49 AM Performed by: Imagene Riches, CRNA Pre-anesthesia Checklist: Patient identified, Emergency Drugs available, Suction available and Patient being monitored Patient Re-evaluated:Patient Re-evaluated prior to induction Oxygen Delivery Method: Circle System Utilized Preoxygenation: Pre-oxygenation with 100% oxygen Induction Type: IV induction Ventilation: Mask ventilation without difficulty Laryngoscope Size: Glidescope and 4 Grade View: Grade I Tube type: Oral Tube size: 7.5 mm Number of attempts: 1 Airway Equipment and Method: Stylet and Oral airway Placement Confirmation: ETT inserted through vocal cords under direct vision,  positive ETCO2 and breath sounds checked- equal and bilateral Secured at: 22 cm Tube secured with: Tape Dental Injury: Teeth and Oropharynx as per pre-operative assessment

## 2019-05-28 NOTE — Transfer of Care (Signed)
Immediate Anesthesia Transfer of Care Note  Patient: [name]  Procedure(s) Performed: [name] REDUCTION (Bilateral ) SINUS ENDO WITH FUSION (Left )  Patient Location: PACU  Anesthesia Type:General  Level of Consciousness: drowsy  Airway & Oxygen Therapy: Patient Spontanous Breathing and Patient connected to face mask oxygen  Post-op Assessment: Report given to RN and Post -op Vital signs reviewed and stable  Post vital signs: Reviewed and stable  Last Vitals:  Vitals Value Taken Time  BP 130/63 05/28/19 0908  Temp    Pulse 75 05/28/19 0909  Resp 18 05/28/19 0909  SpO2 93 % 05/28/19 0909  Vitals shown include unvalidated device data.  Last Pain:  Vitals:   05/28/19 0644  TempSrc:   PainSc: 6       Patients Stated Pain Goal: 2 ([id number])  Complications: No apparent anesthesia complications

## 2019-05-28 NOTE — Telephone Encounter (Signed)
Spoke with patient regarding follow up visit Per patient her surgeon wanted her to stay home until after follow up with him Explained to patient virtual visit not ideal given her swelling but changed secondary to request

## 2019-05-28 NOTE — Anesthesia Postprocedure Evaluation (Signed)
Anesthesia Post Note  Patient: VERSA MARSON  Procedure(s) Performed: [name] REDUCTION (Bilateral ) SINUS ENDO WITH FUSION (Left )     Patient location during evaluation: PACU Anesthesia Type: General Level of consciousness: awake and alert, oriented and patient cooperative Pain management: pain level controlled Vital Signs Assessment: post-procedure vital signs reviewed and stable Respiratory status: spontaneous breathing, nonlabored ventilation, respiratory function stable and patient connected to nasal cannula oxygen Cardiovascular status: blood pressure returned to baseline and stable Postop Assessment: no apparent nausea or vomiting Anesthetic complications: no    Last Vitals:  Vitals:   05/28/19 0925 05/28/19 0935  BP: 126/72 135/68  Pulse: 73 72  Resp: 16 17  Temp:  36.7 C  SpO2: 94% 95%    Last Pain:  Vitals:   05/28/19 0935  TempSrc:   PainSc: 5                  Korri Ask,E. Ramiah Helfrich

## 2019-05-31 LAB — SURGICAL PATHOLOGY

## 2019-06-01 ENCOUNTER — Encounter: Payer: Self-pay | Admitting: Cardiovascular Disease

## 2019-06-01 ENCOUNTER — Telehealth (INDEPENDENT_AMBULATORY_CARE_PROVIDER_SITE_OTHER): Payer: 59 | Admitting: Cardiovascular Disease

## 2019-06-01 VITALS — BP 116/69 | HR 90 | Ht 63.0 in | Wt 285.0 lb

## 2019-06-01 DIAGNOSIS — J441 Chronic obstructive pulmonary disease with (acute) exacerbation: Secondary | ICD-10-CM | POA: Diagnosis not present

## 2019-06-01 DIAGNOSIS — G4733 Obstructive sleep apnea (adult) (pediatric): Secondary | ICD-10-CM

## 2019-06-01 DIAGNOSIS — I10 Essential (primary) hypertension: Secondary | ICD-10-CM

## 2019-06-01 DIAGNOSIS — E7800 Pure hypercholesterolemia, unspecified: Secondary | ICD-10-CM | POA: Diagnosis not present

## 2019-06-01 NOTE — Patient Instructions (Addendum)
Medication Instructions:  Your physician recommends that you continue on your current medications as directed. Please refer to the Current Medication list given to you today.  *If you need a refill on your cardiac medications before your next appointment, please call your pharmacy*  Lab Work: NONE  Testing/Procedures: NONE  Follow-Up: At [facility], you and your health needs are our priority.  As part of our continuing mission to provide you with exceptional heart care, we have created designated Provider Care Teams.  These Care Teams include your primary Cardiologist (physician) and Advanced Practice Providers (APPs -  Physician Assistants and Nurse Practitioners) who all work together to provide you with the care you need, when you need it.  We recommend signing up for the patient portal called "MyChart".  Sign up information is provided on this After Visit Summary.  MyChart is used to connect with patients for Virtual Visits (Telemedicine).  Patients are able to view lab/test results, encounter notes, upcoming appointments, etc.  Non-urgent messages can be sent to your provider as well.   To learn more about what you can do with MyChart, go to [email or web address].    Your next appointment:   6 month(s)  The format for your next appointment:   In Person  Provider:   You may see Tiffany Duncan, MD or one of the following Advanced Practice Providers on your designated Care Team:    Luke Kilroy, PA-C  Callie Goodrich, PA-C  Jesse Cleaver, FNP   

## 2019-06-01 NOTE — Progress Notes (Signed)
Virtual Visit via Video Note   This visit type was conducted due to national recommendations for restrictions regarding the COVID-19 Pandemic (e.g. social distancing) in an effort to limit this patient's exposure and mitigate transmission in our community.  Due to her co-morbid illnesses, this patient is at least at moderate risk for complications without adequate follow up.  This format is felt to be most appropriate for this patient at this time.  All issues noted in this document were discussed and addressed.  A limited physical exam was performed with this format.  Please refer to the patient's chart for her consent to telehealth for [facility]   The patient was identified using 2 identifiers.  Date:  06/01/2019   ID:  [name], DOB [birth year], MRN [id number]  Patient Location: Home Provider Location: Office  PCP:  Bartholome Bill, MD  Cardiologist:  Skeet Latch, MD  Electrophysiologist:  None   Evaluation Performed:  Follow-Up Visit  Chief Complaint:  Shortness of breath  History of Present Illness:    [name] is a 59 y.o. female with chronic diastolic heart failure, hypertension, hyperlipidemia, OSA on BiPAP, morbid obesity, and COPD who presents for follow up.  [name] was previously a patient of Dr. Percival Spanish.  She last saw him 02/2016 and was doing well.  June 2018 she developed hypotension and worsened renal function so her diuretics were held. She developed acute shortness of breath.  BNP was 31 and cardiac enzymes were negative.  She was diuresed with IV lasix. She followed up with her nephrologist and was started on torsemide 40mg  bid.  At her last appointment she reported palpitations.  She wore a 14-day event monitor 10/2016 that revealed an average heart rate of 87 bpm but episodes of palpitations at which time she was noted to be in sinus tachycardia at 120 bpm.  She also had PACs. She was treated for bronchitis while visiting [place] 01/2017.   Her breathing has improved somewhat since then but she continues to struggle.  Carvedilol was added due to poorly controlled blood pressure.  She also reported exertional dyspnea.  She was referred for an echocardiogram 03/03/2017 that revealed LVEF 65 to 70% with grade 1 diastolic dysfunction.  She also had a Lexiscan Myoview 03/2017 that revealed LVEF 77% and no ischemia.  She called our office with lightheadedness and dizziness.  Her blood pressure at the time was 121/56.  Hydralazine was decreased to 25 mg 3 times daily.  She followed up with Charlotte Sims, DNP, on 03/2017.  At that time her blood pressure was well-controlled.  She continued to report dizziness despite lack of orthostasis on exam.  She recently returned from [place].  While there she had two flares of bronchitis while there.  She was treated with doxycycline and prednisone.  At her last appointment her blood pressure was running low when she had not yet taken her medication.  Therefore hydrochlorothiazide was reduced to 12.5 mg.  Since her last appointment she was diagnosed with COVID 09/2018.  She was in [place] and caught it from family members.  She was treated at home with antibiotics and inhalers and did not require hospitalization.  She still chest mucous and chest discomfort.  Her chest discomfort occurs when lying down.  It feels like a sharp apin that is better with deep breaths.  She has no exertional chest pain.  She also struggles with pain and swelling in her R LE.  She has  chronic knee pain and is due to have an injection.  However this pain has been worse than usual.  She recently traveled from [place] by car.  She is unsure what her blood pressure has been running)  At her last appointment there was concern for right lower extremity DVT.  She had lower extremity Dopplers 02/2019 that were negative for DVT.  She noted shortness of breath.  She had a repeat echocardiogram that revealed LVEF greater than 75% with moderate LVH.   Diastolic function was indeterminate.  IVC was less than 1.2 cm and spontaneously collapsed, suggestive of volume depletion.  She was instructed to not use metolazone and use diuretics sparingly.  She notices that when her weight goes up she feels fluid in her knee.  She tries to cut back on torsemide but started having more swelling.  She hasn't used any metolazone in over a month.  Her breathing has been stable other than the sinus surgery.  She had sinus surgery on Friday.  She had a lot of epistaxis and is now is feeling congested.  She thinks that it is helping because she no longer has headaches.    She is doing a little walking around her house but not getting other formal exercise.    Past Medical History:  Diagnosis Date  . Acute on chronic diastolic CHF (congestive heart failure) ([place]) 04/11/2014  . Acute suppurative otitis media without spontaneous rupture of eardrum 02/04/2007   Centricity Description: OTITIS MEDIA, SUPPURATIVE, ACUTE, BILATERAL Qualifier: Diagnosis of  By: [name] MD, [name]  Centricity Description: OTITIS MEDIA, SUPPURATIVE, ACUTE Qualifier: Diagnosis of  By: [name] MD, [name]   . Allergic rhinitis   . Allergy   . Anemia   . Anxiety   . Arthritis   . CAD (coronary artery disease)    LHC (11/29/1999):  EF 60%; no significant CAD.  [name] Carpal tunnel syndrome   . Carpal tunnel syndrome 06/09/2006   Qualifier: Diagnosis of  By: Marca Ancona RMA, Lucy    . Chronic diastolic CHF (congestive heart failure) ([place])    a. Echo (01/21/11): Vigorous LVF, EF 65-70%, no RWMA, Gr 2 DD. b.  Echo (12/15): Moderate LVH, EF 123456, grade 2 diastolic dysfunction, mild LAE, normal RV function c. 01/2015: echo showing EF of 60-65% with mild LVH  . Common migraine   . COPD (chronic obstructive pulmonary disease) ([place])   . Diastolic heart failure ([place])   . Diverticulitis   . Dyspnea   . GERD (gastroesophageal reflux disease)   . Hepatic steatosis   . Hepatomegaly   . Hiatal hernia   . History  of cardiomegaly   . Hyperlipidemia   . Hypertension   . IBS (irritable bowel syndrome)   . Internal hemorrhoids   . Metabolic syndrome   . Morbid obesity ([place])   . OSA (obstructive sleep apnea)    CPAP dependent  . Pneumonia   . PNEUMONIA, ORGAN UNSPECIFIED 03/01/2009   Qualifier: Diagnosis of  By: [name] CMA, [name]    . Sinusitis, chronic 03/10/2012   CT sinuses 02/2012:  Chronic sinusitis with small A/F levels >> rx by ENT with prolonged augmentin F/u ct sinuses 04/2012:  Persistent sinus thickening >> rx with levaquin and clinda for 30 days.  Told to return for sinus scan in 6 weeks >> no showed for followup visit.  CT sinuses 11/2013:  Acute and chronic sinusitis noted.    . Sleep apnea    bi pap   Past  Surgical History:  Procedure Laterality Date  . ABDOMINAL HYSTERECTOMY    . BIOPSY  06/22/2018   Procedure: BIOPSY;  Surgeon: Mauri Pole, MD;  Location: [facility];  Service: Endoscopy;;  . BIOPSY  07/28/2018   Procedure: BIOPSY;  Surgeon: Mauri Pole, MD;  Location: [facility];  Service: Endoscopy;;  . CESAREAN SECTION    . COLONOSCOPY    . COLONOSCOPY WITH PROPOFOL N/A 05/05/2017   Procedure: COLONOSCOPY WITH PROPOFOL;  Surgeon: Mauri Pole, MD;  Location: [facility];  Service: Endoscopy;  Laterality: N/A;  . ESOPHAGOGASTRODUODENOSCOPY (EGD) WITH PROPOFOL N/A 06/22/2018   Procedure: ESOPHAGOGASTRODUODENOSCOPY (EGD) WITH PROPOFOL;  Surgeon: Mauri Pole, MD;  Location: [facility];  Service: Endoscopy;  Laterality: N/A;  . ESOPHAGOGASTRODUODENOSCOPY (EGD) WITH PROPOFOL N/A 07/28/2018   Procedure: ESOPHAGOGASTRODUODENOSCOPY (EGD) WITH PROPOFOL;  Surgeon: Mauri Pole, MD;  Location: [facility];  Service: Endoscopy;  Laterality: N/A;  . NASAL SINUS SURGERY    . PARTIAL HYSTERECTOMY    . SINUS ENDO WITH FUSION Left 05/28/2019   Procedure: SINUS ENDO WITH FUSION;  Surgeon: [name], MD;  Location: [place];  Service: ENT;  Laterality: Left;   . TONSILLECTOMY    . TUBAL LIGATION    . TURBINATE REDUCTION Bilateral 05/28/2019   Procedure: TURBINATE REDUCTION;  Surgeon: [name], MD;  Location: [place];  Service: ENT;  Laterality: Bilateral;  . UVULOPALATOPHARYNGOPLASTY       Current Meds  Medication Sig  . albuterol (PROVENTIL) (2.5 MG/3ML) 0.083% nebulizer solution Take 3 mLs (2.5 mg total) by nebulization every 4 (four) hours as needed for wheezing or shortness of breath.  [name] aspirin 81 MG EC tablet TAKE 1 TABLET BY MOUTH EVERY DAY (Patient taking differently: Take 81 mg by mouth daily. )  . benzonatate (TESSALON) 200 MG capsule Take 1 capsule (200 mg total) by mouth 3 (three) times daily as needed for cough.  . carvedilol (COREG) 12.5 MG tablet TAKE 1 TABLET BY MOUTH TWICE A DAY WITH FOOD (Patient taking differently: Take 12.5 mg by mouth 2 (two) times daily with a meal. )  . cephALEXin (KEFLEX) 500 MG capsule Take 1 capsule (500 mg total) by mouth 3 (three) times daily for 7 days.  [name] dexlansoprazole (DEXILANT) 60 MG capsule Take 1 capsule (60 mg total) by mouth every morning. Take 30 minutes before breakast  . dicyclomine (BENTYL) 10 MG capsule Take 1 capsule (10 mg total) by mouth 3 (three) times daily before meals for 5 days. (Patient taking differently: Take 10 mg by mouth 4 (four) times daily -  before meals and at bedtime. )  . hydrALAZINE (APRESOLINE) 25 MG tablet Take 0.5 tablets (12.5 mg total) by mouth 3 (three) times daily.  . isosorbide dinitrate (ISORDIL) 20 MG tablet Take 1 tablet (20 mg total) by mouth 3 (three) times daily.  [name] levocetirizine (XYZAL) 5 MG tablet Take 5 mg by mouth every evening.  [name] losartan (COZAAR) 50 MG tablet Take 1 tablet (50 mg total) by mouth daily.  [name] lubiprostone (AMITIZA) 24 MCG capsule Take 1 capsule (24 mcg total) by mouth 2 (two) times daily with a meal.  . metolazone (ZAROXOLYN) 2.5 MG tablet Take 1 tablet (2.5 mg total) by mouth daily as needed (FOR OVERNIGHT WEIGHT GAIN OF 3 lbs. or  more).  . montelukast (SINGULAIR) 10 MG tablet TAKE 1 TABLET(10 MG) BY MOUTH AT BEDTIME (Patient taking differently: Take 10 mg by mouth at bedtime. )  . Potassium Chloride ER  20 MEQ TBCR TAKE 3 TABLETS BY MOUTH 3 TIMES A DAY (Patient taking differently: Take 60 mEq by mouth in the morning, at noon, and at bedtime. TAKE 3 TABLETS BY MOUTH 3 TIMES A DAY)  . Respiratory Therapy Supplies (FLUTTER) DEVI 1 application by Does not apply route as directed.  [name] spironolactone (ALDACTONE) 25 MG tablet Take 0.5 tablets (12.5 mg total) by mouth 2 (two) times daily.  [name] topiramate (TOPAMAX) 25 MG tablet Take 25 mg by mouth 2 (two) times daily as needed (for migraines).   . torsemide (DEMADEX) 20 MG tablet Take 2 tablets (40 mg total) by mouth 2 (two) times daily.  . traMADol (ULTRAM) 50 MG tablet Take 1 tablet (50 mg total) by mouth every 6 (six) hours as needed. (Patient taking differently: Take 50 mg by mouth every 6 (six) hours as needed for moderate pain. )     Allergies:   Sulfonamide derivatives, Doxycycline, Latex, Ciprofloxacin, Fish allergy, Hydrocortisone, and Neomycin   Social History   Tobacco Use  . Smoking status: Never Smoker  . Smokeless tobacco: Never Used  Substance Use Topics  . Alcohol use: No  . Drug use: No     Family Hx: The patient's family history includes Asthma in her sister; Breast cancer in an other family member; Colon polyps in her sister; Coronary artery disease in an other family member; Heart attack in her mother; Heart disease in her mother; Hypertension in her mother. There is no history of Stroke, Colon cancer, Esophageal cancer, Rectal cancer, or Stomach cancer.  ROS:   Please see the history of present illness.    All other systems reviewed and are negative.   Prior CV studies:   The following studies were reviewed today:  Lexiscan Myoview 03/2017:  Nuclear stress EF: 77%. No wall motion abnormality. Increased LV wall thickness.  The left ventricular  ejection fraction is hyperdynamic (>65%).  There was no ST segment deviation noted during stress.  This is a low risk study. No ischemia identified.  Echo 03/03/17: Study Conclusions  - Left ventricle: The cavity size was normal. There was moderate concentric hypertrophy. Systolic function was vigorous. The estimated ejection fraction was in the range of 65% to 70%. Wall motion was normal; there were no regional wall motion abnormalities. Doppler parameters are consistent with abnormal left ventricular relaxation (grade 1 diastolic dysfunction). Doppler parameters are consistent with elevated ventricular end-diastolic filling pressure. - Aortic valve: There was no regurgitation. - Mitral valve: There was no regurgitation. - Left atrium: The atrium was normal in size. - Right ventricle: Systolic function was normal. - Right atrium: The atrium was normal in size. - Tricuspid valve: There was no regurgitation. - Pulmonary arteries: Systolic pressure could not be accurately estimated. - Inferior vena cava: The vessel was normal in size. - Pericardium, extracardiac: There was no pericardial effusion.  Echo 03/23/19; IMPRESSIONS    1. Left ventricular ejection fraction, by estimation, is >75%. The left  ventricle has hyperdynamic function. The left ventricle has no regional  wall motion abnormalities. There is moderate left ventricular hypertrophy.  Left ventricular diastolic  parameters are indeterminate.  2. Right ventricular systolic function was not well visualized. The right  ventricular size is not well visualized.  3. The mitral valve was not well visualized. No evidence of mitral valve  regurgitation.  4. The aortic valve was not well visualized. Aortic valve regurgitation  is not visualized.  5. The inferior vena cava IVC measures <1.2 cm and spontaenously  collapses, suggesting volume depletion and a low RA pressure <3 mmHg.   14-day event  Monitor 11/06/16:  Quality: Fair. Baseline artifact. Predominant rhythm: Sinus rhythm Average heart rate: 87 bpm  Patient reported palpitations at which time sinus tachycardia 120 bpm was noted PACs also noted  Labs/Other Tests and Data Reviewed:    EKG:  No ECG reviewed.  The ekg ordered 10/21/16 demonstrates sinus tachycardia.  Rate 104 bpm.   02/18/18: Sinus rhythm.  Rate 67 bpm.  Low voltage. 02/24/19: Sinus bradycardia.  Rate 54 bpm.  Low voltage.  Cannot rule out prior septal infarct  Recent Labs: 06/05/2018: B Natriuretic Peptide 49.0 10/26/2018: ALT 35 05/25/2019: BUN 20; Creatinine, Ser 1.12; Hemoglobin 10.9; Platelets 264; Potassium 3.8; Sodium 140   Recent Lipid Panel Lab Results  Component Value Date/Time   CHOL 169 02/20/2015 04:40 AM   TRIG 310 (H) 02/20/2015 04:40 AM   HDL 35 (L) 02/20/2015 04:40 AM   CHOLHDL 4.8 02/20/2015 04:40 AM   LDLCALC 72 02/20/2015 04:40 AM    Wt Readings from Last 3 Encounters:  06/01/19 285 lb (129.3 kg)  05/28/19 285 lb (129.3 kg)  05/25/19 293 lb 6.4 oz (133.1 kg)     Objective:    Vital Signs:  BP 116/69   Pulse 90   Ht 5\' 3"  (1.6 m)   Wt 285 lb (129.3 kg)   BMI 50.49 kg/m    VITAL SIGNS:  reviewed GEN:  no acute distress EYES:  sclerae anicteric, EOMI - Extraocular Movements Intact RESPIRATORY:  normal respiratory effort, symmetric expansion CARDIOVASCULAR:  no peripheral edema SKIN:  no rash, lesions or ulcers. MUSCULOSKELETAL:  no obvious deformities. NEURO:  alert and oriented x 3, no obvious focal deficit PSYCH:  normal affect  ASSESSMENT & PLAN:    # Chronic diastolic heart failure:  # Hypertension:  # Inappropriate sinus tachycardia: # LE Edema:  Blood pressure and heart rate are well have been controlled.  She tried to cut back on her torsemide but had increased edema.  She is using metolazone very sparingly.  Continue carvedilol, hydralazine, Imdur, losartan, spironolactone, and torsemide.   # Exertional  dyspnea: Lexiscan Myoview and echo were unremarkable other than grade 1 diastolic dysfunction.  Her symptoms are likely due to morbid obesity, deconditioning, and and chronic lung disease.    Encouraged increased exercise with a goal of getting 150 minutes weekly.  # Morbid obesity: Exercise as above.  Consider referral to healthy weight and wellness clinic.  Will discuss at follow-up.  Right now she is recovering from sinus surgery.  # OSA: Continue bipap.   Time:   Today, I have spent 22 minutes with the patient with telehealth technology discussing the above problems.     Medication Adjustments/Labs and Tests Ordered: Current medicines are reviewed at length with the patient today.  Concerns regarding medicines are outlined above.   Tests Ordered: No orders of the defined types were placed in this encounter.   Medication Changes: No orders of the defined types were placed in this encounter.   Follow Up:  In Person in 6 month(s)  Signed, Skeet Latch, MD  06/01/2019 12:29 PM    [place]

## 2019-06-26 ENCOUNTER — Other Ambulatory Visit: Payer: Self-pay | Admitting: Cardiovascular Disease

## 2019-06-28 ENCOUNTER — Other Ambulatory Visit: Payer: Self-pay

## 2019-06-28 ENCOUNTER — Ambulatory Visit (INDEPENDENT_AMBULATORY_CARE_PROVIDER_SITE_OTHER): Payer: 59 | Admitting: Internal Medicine

## 2019-06-28 ENCOUNTER — Encounter: Payer: Self-pay | Admitting: Internal Medicine

## 2019-06-28 DIAGNOSIS — R05 Cough: Secondary | ICD-10-CM

## 2019-06-28 DIAGNOSIS — R058 Other specified cough: Secondary | ICD-10-CM

## 2019-06-28 MED ORDER — FAMOTIDINE 20 MG PO TABS
ORAL_TABLET | ORAL | 11 refills | Status: DC
Start: 2019-06-28 — End: 2019-12-01

## 2019-06-28 MED ORDER — AMOXICILLIN-POT CLAVULANATE 875-125 MG PO TABS
1.0000 | ORAL_TABLET | Freq: Two times a day (BID) | ORAL | 0 refills | Status: DC
Start: 2019-06-28 — End: 2019-11-12

## 2019-06-28 MED ORDER — TRAMADOL HCL 50 MG PO TABS: 50.0000 mg | ORAL_TABLET | ORAL | 0 refills | Status: AC | PRN
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
## 2019-06-28 NOTE — Patient Instructions (Addendum)
Augmentin 875 mg take one pill twice daily  X 21days - take at breakfast and supper with large glass of water.  It would help reduce the usual side effects (diarrhea and yeast infections) if you ate cultured yogurt at lunch.    Take mucinex dm 1200 mg  every 12 hours and as much flutter as your can and supplement if needed with  tramadol 50 mg up to 2 every 4 hours to suppress the urge to cough. Swallowing water and/or using ice chips/non mint and menthol containing candies (such as lifesavers or sugarless jolly ranchers) are also effective.  You should rest your voice and avoid activities that you know make you cough.  Once you have eliminated the cough for 3 straight days try reducing the tramadol first,  then the delsym as tolerated.     Please schedule a follow up office visit in 4 weeks, sooner if needed  with all medications /inhalers/ solutions in hand so we can verify exactly what you are taking. This includes all medications from all doctors and over the counters  - See Anselm Lis NP

## 2019-06-29 ENCOUNTER — Telehealth: Payer: Self-pay

## 2019-06-29 NOTE — Telephone Encounter (Signed)
Amitiza 24 mcg capsules twice daily approved from 06/29/19 - 06/28/22.

## 2019-06-30 ENCOUNTER — Encounter: Payer: Self-pay | Admitting: Internal Medicine

## 2019-06-30 NOTE — Assessment & Plan Note (Addendum)
Onset in her 22s trigger by URI's sev times a year NO  06/01/2015   =  8 - Spirometry 06/01/2015  No obstruction/ poor effort/ reproducibility  Allergy profile 06/01/2015 >  Eos 0.4 /  IgE  35 neg RAST - trial of singulair 06/01/2015 >>> - Sinus CT 06/07/2015 > No definite sinusitis. Only a minimal amount of debris is noted within the right partition of the sphenoid sinus. - Re CT sinus 05/24/2016 > neg > try gabapentin 100 tid (did not purchase)  - added flutter valve 12/27/2016  - 02/07/2017 rechallenge with gabapentin > could not verify 05/23/2017 that she was taking it - 03/11/2018 added prn doxy x 10 d for flares with purulent sputum  - 05/28/19 sinus surgery / shoemaker  1.  Left endoscopic sinus surgery with intraoperative navigation (fusion) consisting of: Left total ethmoidectomy and left maxillary antrostomy with removal of diseased tissue  2.  Bilateral Inferior Turbinate Reduction   Much better p surgery but worse off abx indicating perhaps that opening up the passages is draining now more into airways so rec augmentin x 21 days and f/u with shoemaker as planned  Control cyclical coughing with mucinex dm and flutter valve  Return in 4 weeks to see NP with all meds in hand using a trust but verify approach to confirm accurate Medication  Reconciliation The principal here is that until we are certain that the  patients are doing what we've asked, it makes no sense to ask them to do more.          Each maintenance medication was reviewed in detail including emphasizing most importantly the difference between maintenance and prns and under what circumstances the prns are to be triggered using an action plan format where appropriate.  Total time for H and P, chart review, counseling,  and generating customized AVS unique to this office visit / charting = 20 min

## 2019-07-01 ENCOUNTER — Other Ambulatory Visit: Payer: Self-pay

## 2019-07-01 MED ORDER — POTASSIUM CHLORIDE ER 20 MEQ PO TBCR: EXTENDED_RELEASE_TABLET | ORAL | 3 refills | Status: DC

## 2019-07-19 ENCOUNTER — Other Ambulatory Visit: Payer: Self-pay | Admitting: Pulmonary Disease

## 2019-08-09 ENCOUNTER — Ambulatory Visit (INDEPENDENT_AMBULATORY_CARE_PROVIDER_SITE_OTHER): Payer: 59 | Admitting: Acute Care

## 2019-08-09 ENCOUNTER — Encounter: Payer: Self-pay | Admitting: Acute Care

## 2019-08-09 ENCOUNTER — Other Ambulatory Visit: Payer: Self-pay

## 2019-08-09 ENCOUNTER — Other Ambulatory Visit: Payer: Self-pay | Admitting: Cardiovascular Disease

## 2019-08-09 VITALS — BP 120/70 | HR 58 | Temp 97.6°F | Ht 63.0 in | Wt 298.4 lb

## 2019-08-09 DIAGNOSIS — Z9989 Dependence on other enabling machines and devices: Secondary | ICD-10-CM | POA: Diagnosis not present

## 2019-08-09 DIAGNOSIS — R058 Other specified cough: Secondary | ICD-10-CM

## 2019-08-09 DIAGNOSIS — R05 Cough: Secondary | ICD-10-CM

## 2019-08-09 DIAGNOSIS — G4733 Obstructive sleep apnea (adult) (pediatric): Secondary | ICD-10-CM | POA: Diagnosis not present

## 2019-08-09 MED ORDER — LOSARTAN POTASSIUM 50 MG PO TABS: 50.0000 mg | ORAL_TABLET | Freq: Every day | ORAL | 2 refills | Status: DC

## 2019-08-09 NOTE — Telephone Encounter (Signed)
*  STAT* If patient is at the pharmacy, call can be transferred to refill team.   1. Which medications need to be refilled? (please list name of each medication and dose if known) losartan (COZAAR) 50 MG tablet  2. Which pharmacy/location (including street and city if local pharmacy) is medication to be sent to? [id number] - [place], [place] - [place]  3. Do they need a 30 day or 90 day supply? [place]

## 2019-08-09 NOTE — Progress Notes (Signed)
History of Present Illness [name] is a 59 y.o. female with upper airway cough syndrome and OSA on CPAP. [name] She is followed by Dr. [name]. She has also been seen by Dr. [name] for cough   08/09/2019 Pt. Presents for 1 month follow up. She was last seen by Dr. [name] 06/28/2019. She had sinus surgery per Dr. Wilburn Cornelia. (Left endoscopic sinus surgery with intraoperative navigation (fusion) consisting of: Left total ethmoidectomy and left maxillary antrostomy with removal of diseased tissueand Bilateral Inferior Turbinate Reduction . She had been doing better after surgery, but she became  worse off antibiotics. Dr. [name] felt this was due to opening up the passages which are now  draining now more into airways. He  recommended  augmentin x 21 days and f/u with shoemaker as planned . His management plan to control  cyclical coughing was initiate  mucinex dm and flutter valve.  She is here for follow up. She states she has had a lot of drainage since her surgery. She states she is frustrated that she has continued to have drainage that has been preventing her from being able to use her CPAP machine. She has completed her 21 days of Augmentin, and she has been compliant with Mucinex DM and a flutter valve. She states 1-2 weeks after completing the antibiotics, the  Drainage resumed. . She states the secretions are thick , and white. She states it is hard to get them up. She states it is worse when she lays down to go to sleep.  She states her cough is better on the Tramadol. She states she does not have any wheezing. She is compliant with her Singulair and antihistamines, and her Dupixant and pepcid.  She denies any fever, chest [pain, orthopnea or hemoptysis.   Test Results: CT Sinus 03/18/2019 IMPRESSION: Small amount of layering fluid in the right division of the sphenoid sinus. Mucosal thickening of the left maxillary sinus without layering fluid. No advanced sinus inflammatory disease.  CT Chest  03/18/2019 Lungs/Pleura: No sign of consolidation or evidence of pleural effusion. Airways are patent. No signs of ground-glass attenuation or peripheral consolidation. No acute findings in the chest, no signs of consolidation, ground-glass or bronchial wall thickening.  CBC Latest Ref Rng & Units 05/25/2019 03/10/2019 11/25/2018  WBC 4.0 - 10.5 K/uL 9.2 13.7(H) 10.4  Hemoglobin 12.0 - 15.0 g/dL 10.9(L) 11.2(L) 11.2(L)  Hematocrit 36 - 46 % 35.0(L) 34.1(L) 33.8(L)  Platelets 150 - 400 K/uL 264 263.0 316.0    BMP Latest Ref Rng & Units 05/25/2019 03/10/2019 02/24/2019  Glucose 70 - 99 mg/dL 114(H) 98 110(H)  BUN 6 - 20 mg/dL 20 21 13   Creatinine 0.44 - 1.00 mg/dL 1.12(H) 1.14 0.98  BUN/Creat Ratio 12 - 28 - - 13  Sodium 135 - 145 mmol/L 140 139 142  Potassium 3.5 - 5.1 mmol/L 3.8 3.5 3.5  Chloride 98 - 111 mmol/L 109 104 106  CO2 22 - 32 mmol/L 25 29 22   Calcium 8.9 - 10.3 mg/dL 9.3 9.0 9.3    BNP    Component Value Date/Time   BNP 49.0 06/05/2018 1719    ProBNP    Component Value Date/Time   PROBNP 25.6 04/11/2013 1008    PFT No results found for: FEV1PRE, FEV1POST, FVCPRE, FVCPOST, TLC, DLCOUNC, PREFEV1FVCRT, PSTFEV1FVCRT  No results found.   Past medical hx Past Medical History:  Diagnosis Date  . Acute on chronic diastolic CHF (congestive heart failure) ([place]) 04/11/2014  . Acute suppurative  otitis media without spontaneous rupture of eardrum 02/04/2007   Centricity Description: OTITIS MEDIA, SUPPURATIVE, ACUTE, BILATERAL Qualifier: Diagnosis of  By: [name] MD, [name]  Centricity Description: OTITIS MEDIA, SUPPURATIVE, ACUTE Qualifier: Diagnosis of  By: [name] MD, [name]   . Allergic rhinitis   . Allergy   . Anemia   . Anxiety   . Arthritis   . CAD (coronary artery disease)    LHC (11/29/1999):  EF 60%; no significant CAD.  [name] Carpal tunnel syndrome   . Carpal tunnel syndrome 06/09/2006   Qualifier: Diagnosis of  By: Marca Ancona RMA, Lucy    . Chronic diastolic CHF  (congestive heart failure) ([place])    a. Echo (01/21/11): Vigorous LVF, EF 65-70%, no RWMA, Gr 2 DD. b.  Echo (12/15): Moderate LVH, EF 54-09%, grade 2 diastolic dysfunction, mild LAE, normal RV function c. 01/2015: echo showing EF of 60-65% with mild LVH  . Common migraine   . COPD (chronic obstructive pulmonary disease) ([place])   . Diastolic heart failure ([place])   . Diverticulitis   . Dyspnea   . GERD (gastroesophageal reflux disease)   . Hepatic steatosis   . Hepatomegaly   . Hiatal hernia   . History of cardiomegaly   . Hyperlipidemia   . Hypertension   . IBS (irritable bowel syndrome)   . Internal hemorrhoids   . Metabolic syndrome   . Morbid obesity ([place])   . OSA (obstructive sleep apnea)    CPAP dependent  . Pneumonia   . PNEUMONIA, ORGAN UNSPECIFIED 03/01/2009   Qualifier: Diagnosis of  By: [name] CMA, [name]    . Sinusitis, chronic 03/10/2012   CT sinuses 02/2012:  Chronic sinusitis with small A/F levels >> rx by ENT with prolonged augmentin F/u ct sinuses 04/2012:  Persistent sinus thickening >> rx with levaquin and clinda for 30 days.  Told to return for sinus scan in 6 weeks >> no showed for followup visit.  CT sinuses 11/2013:  Acute and chronic sinusitis noted.    . Sleep apnea    bi pap     Social History   Tobacco Use  . Smoking status: Never Smoker  . Smokeless tobacco: Never Used  Vaping Use  . Vaping Use: Never used  Substance Use Topics  . Alcohol use: No  . Drug use: No    [name] reports that she has never smoked. She has never used smokeless tobacco. She reports that she does not drink alcohol and does not use drugs.  Tobacco Cessation: Never smoker  Past surgical hx, Family hx, Social hx all reviewed.  Current Outpatient Medications on File Prior to Visit  Medication Sig  . albuterol (PROVENTIL) (2.5 MG/3ML) 0.083% nebulizer solution USE 1 VIAL IN NEBULIZER EVERY 4 HOURS - and as needed  . carvedilol (COREG) 12.5 MG tablet TAKE 1 TABLET BY MOUTH  TWICE A DAY WITH FOOD  . dexlansoprazole (DEXILANT) 60 MG capsule Take 1 capsule (60 mg total) by mouth every morning. Take 30 minutes before breakast  . famotidine (PEPCID) 20 MG tablet One after supper  . hydrALAZINE (APRESOLINE) 25 MG tablet Take 0.5 tablets (12.5 mg total) by mouth 3 (three) times daily.  . isosorbide dinitrate (ISORDIL) 20 MG tablet Take 1 tablet (20 mg total) by mouth 3 (three) times daily.  [name] levocetirizine (XYZAL) 5 MG tablet Take 5 mg by mouth every evening.  . lubiprostone (AMITIZA) 24 MCG capsule Take 1 capsule (24 mcg total) by mouth 2 (two) times daily with  a meal.  . meloxicam (MOBIC) 15 MG tablet Take 15 mg by mouth daily.  . metolazone (ZAROXOLYN) 2.5 MG tablet Take 1 tablet (2.5 mg total) by mouth daily as needed (FOR OVERNIGHT WEIGHT GAIN OF 3 lbs. or more).  . montelukast (SINGULAIR) 10 MG tablet TAKE 1 TABLET(10 MG) BY MOUTH AT BEDTIME (Patient taking differently: Take 10 mg by mouth at bedtime. )  . Potassium Chloride ER 20 MEQ TBCR TAKE 3 TABLETS BY MOUTH 3 TIMES A DAY  . Respiratory Therapy Supplies (FLUTTER) DEVI 1 application by Does not apply route as directed.  [name] spironolactone (ALDACTONE) 25 MG tablet Take 0.5 tablets (12.5 mg total) by mouth 2 (two) times daily.  [name] topiramate (TOPAMAX) 25 MG tablet Take 25 mg by mouth 2 (two) times daily as needed (for migraines).   . torsemide (DEMADEX) 20 MG tablet Take 2 tablets (40 mg total) by mouth 2 (two) times daily.  . traMADol (ULTRAM) 50 MG tablet Take 50 mg by mouth 3 (three) times daily as needed.  . dicyclomine (BENTYL) 10 MG capsule Take 1 capsule (10 mg total) by mouth 3 (three) times daily before meals for 5 days. (Patient taking differently: Take 10 mg by mouth 4 (four) times daily -  before meals and at bedtime. )  . [DISCONTINUED] potassium chloride SA (K-DUR,KLOR-CON) 20 MEQ tablet TAKE 3 TABLETS (60 MEQ) BY MOUTH THREE TIMES DAILY (Patient taking differently: Take 180 mEq by mouth daily. )   No  current facility-administered medications on file prior to visit.     Allergies  Allergen Reactions  . Sulfonamide Derivatives Anaphylaxis, Swelling, Rash and Other (See Comments)    Swelling tongue and ear   . Doxycycline Nausea And Vomiting  . Latex Hives  . Ciprofloxacin Rash  . Fish Allergy Rash and Other (See Comments)    Only reaction to Mackerel.  No other issues with any type of fish or shellfish currently   . Hydrocortisone Rash  . Neomycin Rash    Review Of Systems:  Constitutional:   No  weight loss, night sweats,  Fevers, chills, fatigue, or  lassitude.  HEENT:   No headaches,  Difficulty swallowing,  Tooth/dental problems, or  Sore throat,                No sneezing, itching, ear ache, nasal congestion, post nasal drip,   CV:  No chest pain,  Orthopnea, PND, swelling in lower extremities, anasarca, dizziness, palpitations, syncope.   GI  No heartburn, indigestion, abdominal pain, nausea, vomiting, diarrhea, change in bowel habits, loss of appetite, bloody stools.   Resp: + shortness of breath with exertion none at rest.  + excess mucus, no productive cough,  No non-productive cough,  No coughing up of blood.  No change in color of mucus.  + wheezing.  No chest wall deformity  Skin: no rash or lesions.  GU: no dysuria, change in color of urine, no urgency or frequency.  No flank pain, no hematuria   MS:  No joint pain or swelling.  No decreased range of motion.  No back pain.  Psych:  No change in mood or affect. No depression or anxiety.  No memory loss.   Vital Signs BP 120/70 (BP Location: Left Arm, Cuff Size: Normal)   Pulse (!) 58   Temp 97.6 F (36.4 C) (Oral)   Ht 5\' 3"  (1.6 m)   Wt 298 lb 6.4 oz (135.4 kg)   SpO2 99%   BMI 52.86  kg/m    Physical Exam:  General- No distress,  A&Ox3, pleasant ENT: No sinus tenderness, TM clear, pale nasal mucosa, no oral exudate,+ post nasal drip, no LAN Cardiac: S1, S2, regular rate and rhythm, no  murmur Chest: No wheeze/ rales/ dullness; no accessory muscle use, no nasal flaring, no sternal retractions, + throat clearing Abd.: Soft Non-tender, ND, BS + Ext: No clubbing cyanosis, edema Neuro:  normal strength, MAE x 4, A&O x 3 Skin: No rashes, No lesions, warm and dry Psych: normal mood and behavior   Assessment/Plan  Upper Airway Cough Well controlled on Tramadol Copious nasal secretions Plan We will see if we can get you in to see Dr. Wilburn Cornelia before August 3rd. For your increased sinus drainage.  If they cannot get you in sooner, we will see if they can call you.  Continue the Tramadol for cough as you have been doing.  Continue Xyzal , Allegra and Singulair as you have been doing.  Continue the Mucinex DM with the flutter valve. Continue Dexilant with Pepcid as you have been doing. I will message Dr. [name] to see if he has any additional suggestions.  Follow up with Dr. [name] in 3 months or before as needed.  Follow up with Dr. Wilburn Cornelia August 3rd as is scheduled.  Wear CPAP as you are able. Please contact office for sooner follow up if symptoms do not improve or worsen or seek emergency care    OSA on CPAP Hard to wear with copious drainage since surgery Plan Continue on CPAP at bedtime.  Goal is to wear for at least 6 hours each night for maximal clinical benefit. Continue to work on weight loss, as the link between excess weight  and sleep apnea is well established.   Remember to establish a good bedtime routine, and work on sleep hygiene.  Limit daytime naps , avoid stimulants such as caffeine and nicotine close to bedtime, exercise daily to promote sleep quality, avoid heavy , spicy, fried , or rich foods before bed. Ensure adequate exposure to natural light during the day,establish a relaxing bedtime routine with a pleasant sleep environment ( Bedroom between 60 and 67 degrees, turn off bright lights , TV or device screens screens , consider black out curtains or  white noise machines) Do not drive if sleepy. Remember to clean mask, tubing, filter, and reservoir once weekly with soapy water.  Follow up with Dr. [name]  In 6 months  or before as needed.    This appointment was 30 min long with over 50% of the time in direct face-to-face patient care, assessment, plan of care, and follow-up.    Magdalen Spatz, NP 08/09/2019  3:22 PM

## 2019-08-09 NOTE — Patient Instructions (Addendum)
It is good to see you today. We will see if we can get you in to see Dr. Wilburn Cornelia before August 3rd. For your increased sinus drainage.  If they cannot get you in sooner, we will see if they can call you.  Continue the Tramadol for cough as you have been doing.  Continue Xyzal , Allegra and Singulair as you have been doing.  Continue the Mucinex DM with the flutter valve. Continue Dexilant with Pepcid as you have been doing. I will message Dr. [name] to see if he has any additional suggestions.  Follow up with Dr. [name] in 3 months or before as needed.  Follow up with Dr. Wilburn Cornelia August 3rd as is scheduled.  Wear CPAP as you are able. Please contact office for sooner follow up if symptoms do not improve or worsen or seek emergency care

## 2019-08-09 NOTE — Telephone Encounter (Signed)
Rx(s) sent to pharmacy electronically.  

## 2019-09-03 ENCOUNTER — Telehealth: Payer: Self-pay | Admitting: Acute Care

## 2019-09-03 NOTE — Telephone Encounter (Signed)
Patient wants a CBC before she gets the Covid vaccine. She is not able to articulate why she thinks she needs a CBC. She is insisting that the NP call her. Patient advised that triage would communicate any needs or questions to the provider but that providers are busy in the hospital and seeing patient's today. She also wants NP to tell her where to get the vaccine. Patient provided the [facility] website as a resource for [employer] vaccine locations. Please advise.

## 2019-09-06 NOTE — Telephone Encounter (Signed)
Is it possible to ask the patient specifically why she wants a CBC? Is she worried about her WBC? It is not part of our normal protocol prior to vaccine.

## 2019-09-06 NOTE — Telephone Encounter (Signed)
Schedule her for a visit or tele visit and we can discuss this with her. I want her to get the vaccine as she had Covid last year.

## 2019-09-06 NOTE — Telephone Encounter (Signed)
Called spoke with patient on Friday. She states that she did this same protocol before she got her flu shot. She states [name] would know and would like for you to call her as you have called her in the past.  She wants to follow the same process as she did before she got her flu vaccine.  She doesn't know the specifically why , she just kept saying [name] knows why.   SG please advise

## 2019-09-07 NOTE — Telephone Encounter (Signed)
Called spoke with patient. Let her know [name]'s requests, patient scheduled for tele visit on 09/22/19 at 1:30 did not want to use video visit.  Nothing further needed at this time.

## 2019-09-08 ENCOUNTER — Other Ambulatory Visit: Payer: 59

## 2019-09-22 ENCOUNTER — Encounter: Payer: Self-pay | Admitting: Acute Care

## 2019-09-22 ENCOUNTER — Other Ambulatory Visit: Payer: Self-pay

## 2019-09-22 ENCOUNTER — Ambulatory Visit (INDEPENDENT_AMBULATORY_CARE_PROVIDER_SITE_OTHER): Payer: 59 | Admitting: Acute Care

## 2019-09-22 VITALS — BP 122/72 | HR 52 | Temp 97.3°F | Ht 65.0 in | Wt 305.1 lb

## 2019-09-22 DIAGNOSIS — J45991 Cough variant asthma: Secondary | ICD-10-CM | POA: Diagnosis not present

## 2019-09-22 DIAGNOSIS — Z9989 Dependence on other enabling machines and devices: Secondary | ICD-10-CM

## 2019-09-22 DIAGNOSIS — G4733 Obstructive sleep apnea (adult) (pediatric): Secondary | ICD-10-CM

## 2019-09-22 DIAGNOSIS — Z0000 Encounter for general adult medical examination without abnormal findings: Secondary | ICD-10-CM | POA: Diagnosis not present

## 2019-09-22 NOTE — Patient Instructions (Addendum)
It is good to see you today. Continue Mucinex and Zyrtec alternating with Claritin.  She is also taking chlorpheniramine.  Continue Mucinex Sinus as you have been doing. Continue saline irrigations in the morning and at night,   Continue Flonase nasal spray 2 puffs each nare twice daily.  Continue  Dexilant  Medication for reflux. Continue Singulair as you have been doing at bedtime. . Follow up with [facility] as is scheduled 9/13. Good luck with your tooth extraction Get Covid vaccine as you have scheduled for 9/13 Great job with your CPAP therapy. Keep up the good work.    Continue on CPAP at bedtime. You appear to be benefiting from the treatment  Goal is to wear for at least 6 hours each night for maximal clinical benefit. Continue to work on weight loss, as the link between excess weight  and sleep apnea is well established.   Remember to establish a good bedtime routine, and work on sleep hygiene.  Limit daytime naps , avoid stimulants such as caffeine and nicotine close to bedtime, exercise daily to promote sleep quality, avoid heavy , spicy, fried , or rich foods before bed. Ensure adequate exposure to natural light during the day,establish a relaxing bedtime routine with a pleasant sleep environment ( Bedroom between 60 and 67 degrees, turn off bright lights , TV or device screens screens , consider black out curtains or white noise machines) Do not drive if sleepy. Remember to clean mask, tubing, filter, and reservoir once weekly with soapy water.  Follow up with Dr. [name]  In 2 months  or before as needed, to see if he has any additional suggestions for the excess mucus.

## 2019-09-22 NOTE — Progress Notes (Addendum)
History of Present Illness [name] is a 59 y.o. female never smoker  with upper airway cough syndrome and OSA on CPAP.  She is followed by Dr. [name] for OSA . She has also been seen by Dr. [name] for cough.    09/23/2019 Follow up  Pt. Presents for follow up. She had called the office 09/03/2019 requesting a CBC prior to getting her Covid Vaccine. She could not explain why she wanted a CBC prior to vaccine, so she has been scheduled for OV to discuss this. When she arrived here today, however, she states she has already been scheduled to have her Covid 19 vaccine on 10/04/2019. She states that she has been doing well. She continues to complain of increased mucus drainage from her throat which is worse at night, and makes it difficult to wear her CPAP machine at night. She states she is using Mucinex and Zyrtec alternating with Claritin. She is also taking chlorpheniramine. She has added Mucinex Sinus which she states has helped. She is doing saline irrigations in the morning and at night, and she is using Flonase nasal spray 2 puffs each nare twice daily. She is also taking acid reflux medication. She had been referred to ENT for further evaluation. She had left endoscopic sinus surgery and inferior turbinate reduction surgery 05/28/2019. She states she had had no improvement in her sinus drainage since the surgery, and if anything it is worse. Dr. Wilburn Cornelia has referred her back to pulmonary to address the continued post nasal drip and sinus issues.  She is currently in the office gagging on her own secretions. She is very frustrated that she continues to have this drainage. She states she  is compliant with the above aggressive maintenance medications. She states the Mucinex does help with the secretions ( I was concerned the thinner secretions may have been making the drainage worse.)  Pt. Is having dental surgery 09/30/2019. She is having her Covid vaccine next week.  Therapist, music) She has an appointment  10/04/2019 with allergy to evaluate for allergies to see if they could be contributing to her issues with mucus and drainage.   She states she does have some wheezing when sitting up. Secretions are thick white with a scant amount of tan. She denies any fever or facial pain/ tenderness.  Pt. Has never had PFT's, but she does have an albuterol rescue inhaler for her cough variant asthma. She states she does not use this often. She has been challenged in the past with gabapentin.  She states her cough is not her biggest complaint at present, but the drainage that does not seem to improve despite aggressive therapy. I am unsure if she is as compliant with the aggressive sinus regimen as she states she is.     Test Results: CPAP Down Load         03/18/2019 CT Sinus Small amount of layering fluid in the right division of the sphenoid sinus. Mucosal thickening of the left maxillary sinus without layering fluid. No advanced sinus inflammatory disease  Spirometry 03/11/2018  FEV1 1.9 (92%)  Ratio 0.93 with nonphysiologic f/v during flare of cough on maint singulair and coreg 12.5 mg bid   NO  06/01/2015   =  8 - Spirometry 06/01/2015  No obstruction/ poor effort/ reproducibility  Allergy profile 06/01/2015 >  Eos 0.4 /  IgE  35 neg RAST - trial of singulair 06/01/2015 >>> - Sinus CT 06/07/2015 > No definite sinusitis. Only a minimal amount  of debris is noted within the right partition of the sphenoid sinus. - Re CT sinus 05/24/2016 > neg > try gabapentin 100 tid (did not purchase)  - added flutter valve 12/27/2016  - 02/07/2017 rechallenge with gabapentin > could not verify 05/23/2017 that she was taking it    CBC Latest Ref Rng & Units 05/25/2019 03/10/2019 11/25/2018  WBC 4.0 - 10.5 K/uL 9.2 13.7(H) 10.4  Hemoglobin 12.0 - 15.0 g/dL 10.9(L) 11.2(L) 11.2(L)  Hematocrit 36 - 46 % 35.0(L) 34.1(L) 33.8(L)  Platelets 150 - 400 K/uL 264 263.0 316.0    BMP Latest Ref Rng & Units 05/25/2019 03/10/2019  02/24/2019  Glucose 70 - 99 mg/dL 114(H) 98 110(H)  BUN 6 - 20 mg/dL 20 21 13   Creatinine 0.44 - 1.00 mg/dL 1.12(H) 1.14 0.98  BUN/Creat Ratio 12 - 28 - - 13  Sodium 135 - 145 mmol/L 140 139 142  Potassium 3.5 - 5.1 mmol/L 3.8 3.5 3.5  Chloride 98 - 111 mmol/L 109 104 106  CO2 22 - 32 mmol/L 25 29 22   Calcium 8.9 - 10.3 mg/dL 9.3 9.0 9.3    BNP    Component Value Date/Time   BNP 49.0 06/05/2018 1719    ProBNP    Component Value Date/Time   PROBNP 25.6 04/11/2013 1008    PFT No results found for: FEV1PRE, FEV1POST, FVCPRE, FVCPOST, TLC, DLCOUNC, PREFEV1FVCRT, PSTFEV1FVCRT  No results found.   Past medical hx Past Medical History:  Diagnosis Date  . Acute on chronic diastolic CHF (congestive heart failure) ([place]) 04/11/2014  . Acute suppurative otitis media without spontaneous rupture of eardrum 02/04/2007   Centricity Description: OTITIS MEDIA, SUPPURATIVE, ACUTE, BILATERAL Qualifier: Diagnosis of  By: [name] MD, [name]  Centricity Description: OTITIS MEDIA, SUPPURATIVE, ACUTE Qualifier: Diagnosis of  By: [name] MD, [name]   . Allergic rhinitis   . Allergy   . Anemia   . Anxiety   . Arthritis   . CAD (coronary artery disease)    LHC (11/29/1999):  EF 60%; no significant CAD.  [name] Carpal tunnel syndrome   . Carpal tunnel syndrome 06/09/2006   Qualifier: Diagnosis of  By: Marca Ancona RMA, Lucy    . Chronic diastolic CHF (congestive heart failure) ([place])    a. Echo (01/21/11): Vigorous LVF, EF 65-70%, no RWMA, Gr 2 DD. b.  Echo (12/15): Moderate LVH, EF 81-82%, grade 2 diastolic dysfunction, mild LAE, normal RV function c. 01/2015: echo showing EF of 60-65% with mild LVH  . Common migraine   . COPD (chronic obstructive pulmonary disease) ([place])   . Diastolic heart failure ([place])   . Diverticulitis   . Dyspnea   . GERD (gastroesophageal reflux disease)   . Hepatic steatosis   . Hepatomegaly   . Hiatal hernia   . History of cardiomegaly   . Hyperlipidemia   . Hypertension   .  IBS (irritable bowel syndrome)   . Internal hemorrhoids   . Metabolic syndrome   . Morbid obesity ([place])   . OSA (obstructive sleep apnea)    CPAP dependent  . Pneumonia   . PNEUMONIA, ORGAN UNSPECIFIED 03/01/2009   Qualifier: Diagnosis of  By: [name] CMA, [name]    . Sinusitis, chronic 03/10/2012   CT sinuses 02/2012:  Chronic sinusitis with small A/F levels >> rx by ENT with prolonged augmentin F/u ct sinuses 04/2012:  Persistent sinus thickening >> rx with levaquin and clinda for 30 days.  Told to return for sinus scan in 6 weeks >> no  showed for followup visit.  CT sinuses 11/2013:  Acute and chronic sinusitis noted.    . Sleep apnea    bi pap     Social History   Tobacco Use  . Smoking status: Never Smoker  . Smokeless tobacco: Never Used  Vaping Use  . Vaping Use: Never used  Substance Use Topics  . Alcohol use: No  . Drug use: No    Ms.Klemz reports that she has never smoked. She has never used smokeless tobacco. She reports that she does not drink alcohol and does not use drugs.  Tobacco Cessation: Never smoker  Past surgical hx, Family hx, Social hx all reviewed.  Current Outpatient Medications on File Prior to Visit  Medication Sig  . albuterol (PROVENTIL) (2.5 MG/3ML) 0.083% nebulizer solution USE 1 VIAL IN NEBULIZER EVERY 4 HOURS - and as needed  . carvedilol (COREG) 12.5 MG tablet TAKE 1 TABLET BY MOUTH TWICE A DAY WITH FOOD  . dexlansoprazole (DEXILANT) 60 MG capsule Take 1 capsule (60 mg total) by mouth every morning. Take 30 minutes before breakast  . famotidine (PEPCID) 20 MG tablet One after supper  . hydrALAZINE (APRESOLINE) 25 MG tablet Take 0.5 tablets (12.5 mg total) by mouth 3 (three) times daily.  . isosorbide dinitrate (ISORDIL) 20 MG tablet Take 1 tablet (20 mg total) by mouth 3 (three) times daily.  [name] levocetirizine (XYZAL) 5 MG tablet Take 5 mg by mouth every evening.  [name] losartan (COZAAR) 50 MG tablet Take 1 tablet (50 mg total) by mouth daily.  [name]  lubiprostone (AMITIZA) 24 MCG capsule Take 1 capsule (24 mcg total) by mouth 2 (two) times daily with a meal.  . meloxicam (MOBIC) 15 MG tablet Take 15 mg by mouth daily.  . metolazone (ZAROXOLYN) 2.5 MG tablet Take 1 tablet (2.5 mg total) by mouth daily as needed (FOR OVERNIGHT WEIGHT GAIN OF 3 lbs. or more).  . montelukast (SINGULAIR) 10 MG tablet TAKE 1 TABLET(10 MG) BY MOUTH AT BEDTIME (Patient taking differently: Take 10 mg by mouth at bedtime. )  . Potassium Chloride ER 20 MEQ TBCR TAKE 3 TABLETS BY MOUTH 3 TIMES A DAY  . Respiratory Therapy Supplies (FLUTTER) DEVI 1 application by Does not apply route as directed.  [name] spironolactone (ALDACTONE) 25 MG tablet Take 0.5 tablets (12.5 mg total) by mouth 2 (two) times daily.  [name] topiramate (TOPAMAX) 25 MG tablet Take 25 mg by mouth 2 (two) times daily as needed (for migraines).   . torsemide (DEMADEX) 20 MG tablet Take 2 tablets (40 mg total) by mouth 2 (two) times daily.  . traMADol (ULTRAM) 50 MG tablet Take 50 mg by mouth 3 (three) times daily as needed.  . dicyclomine (BENTYL) 10 MG capsule Take 1 capsule (10 mg total) by mouth 3 (three) times daily before meals for 5 days. (Patient taking differently: Take 10 mg by mouth 4 (four) times daily -  before meals and at bedtime. )  . [DISCONTINUED] potassium chloride SA (K-DUR,KLOR-CON) 20 MEQ tablet TAKE 3 TABLETS (60 MEQ) BY MOUTH THREE TIMES DAILY (Patient taking differently: Take 180 mEq by mouth daily. )   No current facility-administered medications on file prior to visit.     Allergies  Allergen Reactions  . Sulfonamide Derivatives Anaphylaxis, Swelling, Rash and Other (See Comments)    Swelling tongue and ear   . Doxycycline Nausea And Vomiting  . Latex Hives  . Ciprofloxacin Rash  . Fish Allergy Rash and Other (See Comments)  Only reaction to Mackerel.  No other issues with any type of fish or shellfish currently   . Hydrocortisone Rash  . Neomycin Rash    Review Of  Systems:  Constitutional:   No  weight loss, night sweats,  Fevers, chills, fatigue, or  lassitude.  HEENT:   No headaches,  Difficulty swallowing,  Tooth/dental problems, or  Sore throat,                No sneezing, itching, ear ache,+ nasal congestion,++ post nasal drip,   CV:  No chest pain,  Orthopnea, PND, swelling in lower extremities, anasarca, dizziness, palpitations, syncope.   GI  No heartburn, indigestion, abdominal pain, nausea, vomiting, diarrhea, change in bowel habits, loss of appetite, bloody stools.   Resp: No shortness of breath with exertion or at rest.  ++ excess mucus, + productive cough,  No non-productive cough,  No coughing up of blood.  No change in color of mucus.  +  Wheezing when sitting up,.  No chest wall deformity  Skin: no rash or lesions.  GU: no dysuria, change in color of urine, no urgency or frequency.  No flank pain, no hematuria   MS:  No joint pain or swelling.  No decreased range of motion.  No back pain.  Psych:  No change in mood or affect. No depression or anxiety.  No memory loss.   Vital Signs BP 122/72 (BP Location: Left Arm, Cuff Size: Normal)   Pulse (!) 52   Temp (!) 97.3 F (36.3 C) (Oral)   Ht 5\' 5"  (1.651 m)   Wt (!) 305 lb 1.6 oz (138.4 kg)   SpO2 98%   BMI 50.77 kg/m    Physical Exam:  General- No distress,  A&Ox3, pleasant ENT: No sinus tenderness, TM clear, pale nasal mucosa, no oral exudate,+ post nasal drip, no LAN Cardiac: S1, S2, regular rate and rhythm, no murmur Chest: No wheeze/ rales/ dullness; no accessory muscle use, no nasal flaring, no sternal retractions Abd.: Soft Non-tender, ND, BS +, Body mass index is 50.77 kg/m. Ext: No clubbing cyanosis, 1+ BLE edema Neuro:  normal strength, MAE x 4, A&O x 3 Skin: No rashes, warm and dry, No lesions Psych: normal mood and behavior   Assessment/Plan Cough Variant Asthma with excessive PND   ( no improvement with aggressive therapy) States PND Worse after sinus  surgery No wheezing on exam Plan Continue Mucinex and Zyrtec alternating with Claritin.  She is also taking chlorpheniramine.  Continue Mucinex Sinus as you have been doing. Continue saline irrigations in the morning and at night,   Continue Flonase nasal spray 2 puffs each nare twice daily.  Continue  Dexilant  Medication for reflux in the morning and Pepcid 40 mg in the evening. Continue Singulair as you have been doing at bedtime. . Follow up with [facility] as is scheduled 9/13. Follow up with Dr. [name] in 1-2 months or before as needed .  OSA on CPAP Great compliance Plan Continue on CPAP at bedtime. You appear to be benefiting from the treatment  Goal is to wear for at least 6 hours each night for maximal clinical benefit. Continue to work on weight loss, as the link between excess weight  and sleep apnea is well established.   Remember to establish a good bedtime routine, and work on sleep hygiene.  Limit daytime naps , avoid stimulants such as caffeine and nicotine close to bedtime, exercise daily to promote sleep quality, avoid  heavy , spicy, fried , or rich foods before bed. Ensure adequate exposure to natural light during the day,establish a relaxing bedtime routine with a pleasant sleep environment ( Bedroom between 60 and 67 degrees, turn off bright lights , TV or device screens screens , consider black out curtains or white noise machines) Do not drive if sleepy. Remember to clean mask, tubing, filter, and reservoir once weekly with soapy water.   Post Covid Pneumonia>> Resolved ( CXR 03/18/2019) Has not yet vaccinated and is well beyond 90 day window.  Plan Covid Vaccine Scheduled next week  This appointment was 40 min long with over 50% of the time in direct face-to-face patient care, assessment, plan of care, and follow-up.   Magdalen Spatz, NP 09/23/2019  10:19 AM

## 2019-09-23 ENCOUNTER — Encounter: Payer: Self-pay | Admitting: Acute Care

## 2019-09-23 ENCOUNTER — Telehealth: Payer: Self-pay | Admitting: Acute Care

## 2019-09-23 NOTE — Telephone Encounter (Signed)
Please call patient and make sure she knows to bring all her meds to her appointment with Dr. [name]. Thanks so much.  From. Dr. [name] Be sure to let her know I can't help her if she doesn't show up with all meds she's taking and also will be obtaining a pharmacy list of everything she's filled this year using the principle that until we are certain that the patient is doing what we've asked, it makes no sense to ask her to do more.

## 2019-09-29 ENCOUNTER — Telehealth: Payer: Self-pay | Admitting: Cardiovascular Disease

## 2019-09-29 NOTE — Telephone Encounter (Signed)
  1. What dental office are you calling from? [facility]   2. What is your office phone number? [phone] (ext: [phone])   3. What is your fax number? [phone]  4. What type of procedure is the patient having performed? 1 (#17) tooth extraction   5. What date is procedure scheduled or is the patient there now? 09/30/19     6. What is your question? Brianna with [employer] is inquiring about whether or not any medications will need to be held. She states their office faxed an initial clearance request on 09/22/19 and she re-faxed it on 09/28/19.

## 2019-09-29 NOTE — Telephone Encounter (Signed)
Patient is calling to follow up. She is requesting to discuss recommendations with clinical staff.

## 2019-09-29 NOTE — Telephone Encounter (Signed)
   Primary Cardiologist: Skeet Latch, MD  Chart reviewed as part of pre-operative protocol coverage.   Simple dental extractions are considered low risk procedures per guidelines and generally do not require any specific cardiac clearance. It is also generally accepted that for simple extractions and dental cleanings, there is no need to interrupt blood thinner therapy.   SBE prophylaxis is not required for the patient.  I will route this recommendation to the requesting party via Epic fax function and remove from pre-op pool.  Please call with questions.  Kerin Ransom, PA-C 09/29/2019, 10:48 AM

## 2019-09-29 NOTE — Telephone Encounter (Signed)
I called her back- questions answered.  Kerin Ransom PA-C 09/29/2019 2:52 PM

## 2019-10-22 ENCOUNTER — Other Ambulatory Visit: Payer: Self-pay | Admitting: Pulmonary Disease

## 2019-11-12 ENCOUNTER — Other Ambulatory Visit: Payer: Self-pay | Admitting: Internal Medicine

## 2019-11-12 NOTE — Telephone Encounter (Addendum)
Pt is requesting refill on AMOXICILLIN-CLAV 875-125MG  TAB next ov 10 25 /21. Pt was left a vm to inform meds are ready at pharmacy for pick up

## 2019-11-15 ENCOUNTER — Ambulatory Visit: Payer: 59 | Admitting: Internal Medicine

## 2019-11-15 ENCOUNTER — Other Ambulatory Visit: Payer: Self-pay
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
## 2019-12-01 ENCOUNTER — Ambulatory Visit (INDEPENDENT_AMBULATORY_CARE_PROVIDER_SITE_OTHER): Payer: 59 | Admitting: Cardiovascular Disease

## 2019-12-01 ENCOUNTER — Encounter: Payer: Self-pay | Admitting: Cardiovascular Disease

## 2019-12-01 VITALS — BP 105/63 | HR 54 | Temp 97.0°F | Ht 63.0 in | Wt 297.2 lb

## 2019-12-01 DIAGNOSIS — I10 Essential (primary) hypertension: Secondary | ICD-10-CM | POA: Diagnosis not present

## 2019-12-01 DIAGNOSIS — G4733 Obstructive sleep apnea (adult) (pediatric): Secondary | ICD-10-CM

## 2019-12-01 DIAGNOSIS — E7800 Pure hypercholesterolemia, unspecified: Secondary | ICD-10-CM | POA: Diagnosis not present

## 2019-12-01 NOTE — Progress Notes (Signed)
Cardiology Office Note   Date:  12/01/2019   ID:  [name], DOB 1960-04-28, MRN [id number]  PCP:  Bartholome Bill, MD  Cardiologist:  Skeet Latch, MD  Electrophysiologist:  None   Evaluation Performed:  Follow-Up Visit  Chief Complaint:  Shortness of breath  History of Present Illness:    [name] is a 59 y.o. female with chronic diastolic heart failure, hypertension, hyperlipidemia, OSA on BiPAP, morbid obesity, and COPD who presents for follow up.  [name] was previously a patient of Dr. Percival Spanish.  She last saw him 02/2016 and was doing well.  June 2018 she developed hypotension and worsened renal function so her diuretics were held. She developed acute shortness of breath.  BNP was 31 and cardiac enzymes were negative.  She was diuresed with IV lasix. She followed up with her nephrologist and was started on torsemide 40mg  bid.  At her last appointment she reported palpitations.  She wore a 14-day event monitor 10/2016 that revealed an average heart rate of 87 bpm but episodes of palpitations at which time she was noted to be in sinus tachycardia at 120 bpm.  She also had PACs. She was treated for bronchitis while visiting [place] 01/2017.  Her breathing has improved somewhat since then but she continues to struggle.  Carvedilol was added due to poorly controlled blood pressure.  She also reported exertional dyspnea.  She was referred for an echocardiogram 03/03/2017 that revealed LVEF 65 to 70% with grade 1 diastolic dysfunction.  She also had a Lexiscan Myoview 03/2017 that revealed LVEF 77% and no ischemia.  She called our office with lightheadedness and dizziness.  Her blood pressure at the time was 121/56.  Hydralazine was decreased to 25 mg 3 times daily.  She followed up with Jory Sims, DNP, on 03/2017.  At that time her blood pressure was well-controlled.  She continued to report dizziness despite lack of orthostasis on exam.  She recently returned from [place].   While there she had two flares of bronchitis while there.  She was treated with doxycycline and prednisone.  At her last appointment her blood pressure was running low when she had not yet taken her medication.  Therefore hydrochlorothiazide was reduced to 12.5 mg.  Since her last appointment she was diagnosed with COVID 09/2018.  She was in [place] and caught it from family members.  She was treated at home with antibiotics and inhalers and did not require hospitalization.  She still chest mucous and chest discomfort.  Her chest discomfort occurs when lying down.  It feels like a sharp apin that is better with deep breaths.  She has no exertional chest pain.  She also struggles with pain and swelling in her R LE.  She has chronic knee pain and is due to have an injection.  However this pain has been worse than usual.  She recently traveled from [place] by car.  She is unsure what her blood pressure has been running)  [name] had lower extremity Dopplers 02/2019 that were negative for DVT.  She noted shortness of breath.  She had a repeat echocardiogram that revealed LVEF greater than 75% with moderate LVH.  Diastolic function was indeterminate.  IVC was less than 1.2 cm and spontaneously collapsed, suggestive of volume depletion.  She was instructed to not use metolazone and use diuretics sparingly.  She notices that when her weight goes up she feels fluid in her knee.  She tries  to cut back on torsemide but started having more swelling.  Since her last appointment she slipped on an air mattress and her knee went out.  She got a steroid injection that didn't work.  She has been struggling with needing to take care of her mom and injured her other knee.  Her breathing has been stable.  She has been tired lately and falling asleep easily.  She isn't sleeping well at night.  She uses her BiPAP.  She has no chest pain, LE edema, orthopnea or PND.  She hasn't needed any metolazone in a month.  She is  contemplating having his knees replaced. Her orthopedic surgeon will be referring her to a weight loss clinic before she can have surgery.   Past Medical History:  Diagnosis Date  . Acute on chronic diastolic CHF (congestive heart failure) ([place]) 04/11/2014  . Acute suppurative otitis media without spontaneous rupture of eardrum 02/04/2007   Centricity Description: OTITIS MEDIA, SUPPURATIVE, ACUTE, BILATERAL Qualifier: Diagnosis of  By: [name] MD, [name]  Centricity Description: OTITIS MEDIA, SUPPURATIVE, ACUTE Qualifier: Diagnosis of  By: [name] MD, [name]   . Allergic rhinitis   . Allergy   . Anemia   . Anxiety   . Arthritis   . CAD (coronary artery disease)    LHC (11/29/1999):  EF 60%; no significant CAD.  [name] Carpal tunnel syndrome   . Carpal tunnel syndrome 06/09/2006   Qualifier: Diagnosis of  By: Marca Ancona RMA, Lucy    . Chronic diastolic CHF (congestive heart failure) ([place])    a. Echo (01/21/11): Vigorous LVF, EF 65-70%, no RWMA, Gr 2 DD. b.  Echo (12/15): Moderate LVH, EF 77-93%, grade 2 diastolic dysfunction, mild LAE, normal RV function c. 01/2015: echo showing EF of 60-65% with mild LVH  . Common migraine   . COPD (chronic obstructive pulmonary disease) ([place])   . Diastolic heart failure ([place])   . Diverticulitis   . Dyspnea   . GERD (gastroesophageal reflux disease)   . Hepatic steatosis   . Hepatomegaly   . Hiatal hernia   . History of cardiomegaly   . Hyperlipidemia   . Hypertension   . IBS (irritable bowel syndrome)   . Internal hemorrhoids   . Metabolic syndrome   . Morbid obesity ([place])   . OSA (obstructive sleep apnea)    CPAP dependent  . Pneumonia   . PNEUMONIA, ORGAN UNSPECIFIED 03/01/2009   Qualifier: Diagnosis of  By: [name] CMA, [name]    . Sinusitis, chronic 03/10/2012   CT sinuses 02/2012:  Chronic sinusitis with small A/F levels >> rx by ENT with prolonged augmentin F/u ct sinuses 04/2012:  Persistent sinus thickening >> rx with levaquin and clinda for 30  days.  Told to return for sinus scan in 6 weeks >> no showed for followup visit.  CT sinuses 11/2013:  Acute and chronic sinusitis noted.    . Sleep apnea    bi pap   Past Surgical History:  Procedure Laterality Date  . ABDOMINAL HYSTERECTOMY    . BIOPSY  06/22/2018   Procedure: BIOPSY;  Surgeon: Mauri Pole, MD;  Location: [facility];  Service: Endoscopy;;  . BIOPSY  07/28/2018   Procedure: BIOPSY;  Surgeon: Mauri Pole, MD;  Location: [facility];  Service: Endoscopy;;  . CESAREAN SECTION    . COLONOSCOPY    . COLONOSCOPY WITH PROPOFOL N/A 05/05/2017   Procedure: COLONOSCOPY WITH PROPOFOL;  Surgeon: Mauri Pole, MD;  Location: [facility];  Service: Endoscopy;  Laterality: N/A;  . ESOPHAGOGASTRODUODENOSCOPY (EGD) WITH PROPOFOL N/A 06/22/2018   Procedure: ESOPHAGOGASTRODUODENOSCOPY (EGD) WITH PROPOFOL;  Surgeon: Mauri Pole, MD;  Location: [facility];  Service: Endoscopy;  Laterality: N/A;  . ESOPHAGOGASTRODUODENOSCOPY (EGD) WITH PROPOFOL N/A 07/28/2018   Procedure: ESOPHAGOGASTRODUODENOSCOPY (EGD) WITH PROPOFOL;  Surgeon: Mauri Pole, MD;  Location: [facility];  Service: Endoscopy;  Laterality: N/A;  . NASAL SINUS SURGERY    . PARTIAL HYSTERECTOMY    . SINUS ENDO WITH FUSION Left 05/28/2019   Procedure: SINUS ENDO WITH FUSION;  Surgeon: [name], MD;  Location: [facility];  Service: ENT;  Laterality: Left;  . TONSILLECTOMY    . TUBAL LIGATION    . TURBINATE REDUCTION Bilateral 05/28/2019   Procedure: TURBINATE REDUCTION;  Surgeon: [name], MD;  Location: [facility];  Service: ENT;  Laterality: Bilateral;  . UVULOPALATOPHARYNGOPLASTY       Current Meds  Medication Sig  . albuterol (PROVENTIL) (2.5 MG/3ML) 0.083% nebulizer solution USE 1 VIAL IN NEBULIZER EVERY 4 HOURS - and as needed  . carvedilol (COREG) 12.5 MG tablet TAKE 1 TABLET BY MOUTH TWICE A DAY WITH FOOD  . dexlansoprazole (DEXILANT) 60 MG capsule Take 1 capsule (60 mg total) by  mouth every morning. Take 30 minutes before breakast  . hydrALAZINE (APRESOLINE) 25 MG tablet Take 0.5 tablets (12.5 mg total) by mouth 3 (three) times daily.  . isosorbide dinitrate (ISORDIL) 20 MG tablet Take 1 tablet (20 mg total) by mouth 3 (three) times daily.  [name] levocetirizine (XYZAL) 5 MG tablet Take 5 mg by mouth every evening.  [name] losartan (COZAAR) 50 MG tablet Take 1 tablet (50 mg total) by mouth daily.  . meloxicam (MOBIC) 15 MG tablet Take 15 mg by mouth daily.  . metolazone (ZAROXOLYN) 2.5 MG tablet Take 1 tablet (2.5 mg total) by mouth daily as needed (FOR OVERNIGHT WEIGHT GAIN OF 3 lbs. or more).  . montelukast (SINGULAIR) 10 MG tablet TAKE 1 TABLET(10 MG) BY MOUTH AT BEDTIME (Patient taking differently: Take 10 mg by mouth at bedtime. )  . Potassium Chloride ER 20 MEQ TBCR TAKE 3 TABLETS BY MOUTH 3 TIMES A DAY  . Respiratory Therapy Supplies (FLUTTER) DEVI 1 application by Does not apply route as directed.  [name] spironolactone (ALDACTONE) 25 MG tablet Take 0.5 tablets (12.5 mg total) by mouth 2 (two) times daily.  [name] topiramate (TOPAMAX) 25 MG tablet Take 25 mg by mouth 2 (two) times daily as needed (for migraines).   . torsemide (DEMADEX) 20 MG tablet Take 2 tablets (40 mg total) by mouth 2 (two) times daily.  . traMADol (ULTRAM) 50 MG tablet Take 50 mg by mouth 3 (three) times daily as needed.  . [DISCONTINUED] amoxicillin-clavulanate (AUGMENTIN) 875-125 MG tablet TAKE 1 TABLET BY MOUTH TWICE A DAY FOR 10 DAYS  . [DISCONTINUED] famotidine (PEPCID) 20 MG tablet One after supper  . [DISCONTINUED] lubiprostone (AMITIZA) 24 MCG capsule Take 1 capsule (24 mcg total) by mouth 2 (two) times daily with a meal.     Allergies:   Sulfonamide derivatives, Doxycycline, Latex, Ciprofloxacin, Fish allergy, Hydrocortisone, and Neomycin   Social History   Tobacco Use  . Smoking status: Never Smoker  . Smokeless tobacco: Never Used  Vaping Use  . Vaping Use: Never used  Substance Use Topics    . Alcohol use: No  . Drug use: No     Family Hx: The patient's family history includes Asthma in her sister; Breast cancer in  an other family member; Colon polyps in her sister; Coronary artery disease in an other family member; Heart attack in her mother; Heart disease in her mother; Hypertension in her mother. There is no history of Stroke, Colon cancer, Esophageal cancer, Rectal cancer, or Stomach cancer.  ROS:   Please see the history of present illness.    All other systems reviewed and are negative.   Prior CV studies:   The following studies were reviewed today:  Lexiscan Myoview 03/2017:  Nuclear stress EF: 77%. No wall motion abnormality. Increased LV wall thickness.  The left ventricular ejection fraction is hyperdynamic (>65%).  There was no ST segment deviation noted during stress.  This is a low risk study. No ischemia identified.  Echo 03/03/17: Study Conclusions  - Left ventricle: The cavity size was normal. There was moderate concentric hypertrophy. Systolic function was vigorous. The estimated ejection fraction was in the range of 65% to 70%. Wall motion was normal; there were no regional wall motion abnormalities. Doppler parameters are consistent with abnormal left ventricular relaxation (grade 1 diastolic dysfunction). Doppler parameters are consistent with elevated ventricular end-diastolic filling pressure. - Aortic valve: There was no regurgitation. - Mitral valve: There was no regurgitation. - Left atrium: The atrium was normal in size. - Right ventricle: Systolic function was normal. - Right atrium: The atrium was normal in size. - Tricuspid valve: There was no regurgitation. - Pulmonary arteries: Systolic pressure could not be accurately estimated. - Inferior vena cava: The vessel was normal in size. - Pericardium, extracardiac: There was no pericardial effusion.  Echo 03/23/19; IMPRESSIONS    1. Left ventricular ejection  fraction, by estimation, is >75%. The left  ventricle has hyperdynamic function. The left ventricle has no regional  wall motion abnormalities. There is moderate left ventricular hypertrophy.  Left ventricular diastolic  parameters are indeterminate.  2. Right ventricular systolic function was not well visualized. The right  ventricular size is not well visualized.  3. The mitral valve was not well visualized. No evidence of mitral valve  regurgitation.  4. The aortic valve was not well visualized. Aortic valve regurgitation  is not visualized.  5. The inferior vena cava IVC measures <1.2 cm and spontaenously  collapses, suggesting volume depletion and a low RA pressure <3 mmHg.   14-day event Monitor 11/06/16:  Quality: Fair. Baseline artifact. Predominant rhythm: Sinus rhythm Average heart rate: 87 bpm  Patient reported palpitations at which time sinus tachycardia 120 bpm was noted PACs also noted  Labs/Other Tests and Data Reviewed:    EKG:  No ECG reviewed.  The ekg ordered 10/21/16 demonstrates sinus tachycardia.  Rate 104 bpm.   02/18/18: Sinus rhythm.  Rate 67 bpm.  Low voltage. 02/24/19: Sinus bradycardia.  Rate 54 bpm.  Low voltage.  Cannot rule out prior septal infarct 12/01/19: Sinus bradycardia.  Rate 54 bpm.  Low voltage.  Prior septal infarct.  Recent Labs: 05/25/2019: BUN 20; Creatinine, Ser 1.12; Hemoglobin 10.9; Platelets 264; Potassium 3.8; Sodium 140   Recent Lipid Panel Lab Results  Component Value Date/Time   CHOL 169 02/20/2015 04:40 AM   TRIG 310 (H) 02/20/2015 04:40 AM   HDL 35 (L) 02/20/2015 04:40 AM   CHOLHDL 4.8 02/20/2015 04:40 AM   LDLCALC 72 02/20/2015 04:40 AM    Wt Readings from Last 3 Encounters:  12/01/19 297 lb 3.2 oz (134.8 kg)  09/22/19 (!) 305 lb 1.6 oz (138.4 kg)  08/09/19 298 lb 6.4 oz (135.4 kg)  Objective:    VS:  BP 105/63   Pulse (!) 54   Temp (!) 97 F (36.1 C)   Ht 5\' 3"  (1.6 m)   Wt 297 lb 3.2 oz (134.8 kg)    SpO2 96%   BMI 52.65 kg/m  , BMI Body mass index is 52.65 kg/m. GENERAL:  Well appearing HEENT: Pupils equal round and reactive, fundi not visualized, oral mucosa unremarkable.  Poor dentition. NECK:  No jugular venous distention, waveform within normal limits, carotid upstroke brisk and symmetric, no bruits LUNGS:  Clear to auscultation bilaterally HEART:  RRR.  PMI not displaced or sustained,S1 and S2 within normal limits, no S3, no S4, no clicks, no rubs, no murmurs ABD:  Flat, positive bowel sounds normal in frequency in pitch, no bruits, no rebound, no guarding, no midline pulsatile mass, no hepatomegaly, no splenomegaly EXT:  2 plus pulses throughout, no edema, no cyanosis no clubbing SKIN:  No rashes no nodules NEURO:  Cranial nerves II through XII grossly intact, motor grossly intact throughout PSYCH:  Cognitively intact, oriented to person place and time   ASSESSMENT & PLAN:    # Chronic diastolic heart failure:  # Hypertension:  # Inappropriate sinus tachycardia: # LE Edema:  Blood pressure and heart rate are well have been controlled.  She tried to cut back on her torsemide but had increased edema.  She is using metolazone very sparingly.  Weight and volume status have been stable.  Continue carvedilol, hydralazine, Imdur, losartan, spironolactone, and torsemide.   # Exertional dyspnea: Lexiscan Myoview and echo were unremarkable other than grade 1 diastolic dysfunction.  Her symptoms are likely due to morbid obesity, deconditioning, and and chronic lung disease.    Encouraged increased exercise with a goal of getting 150 minutes weekly.  # Morbid obesity: Agree with weight loss clinic.  Check lipids, CMP and thyroid function.  # OSA: Continue bipap.  # Pre-surgical risk: [name] is considering knee replacement.  She cannot achieve 4 METS without dyspnea.  However, her symptoms are unchanged since her stress test in 2019.  She has no chest pain.  No coronary  calcification on chest CT 02/2019.  She is at acceptable risk for knee replacement when she loses enough weight per orthopedic surgery.     Medication Adjustments/Labs and Tests Ordered: Current medicines are reviewed at length with the patient today.  Concerns regarding medicines are outlined above.   Tests Ordered: No orders of the defined types were placed in this encounter.   Medication Changes: No orders of the defined types were placed in this encounter.   Follow Up: with Tonilynn Bieker C. Oval Linsey, MD, [facility] in 6 months Signed, Skeet Latch, MD  12/01/2019 4:05 PM    [place]

## 2019-12-01 NOTE — Patient Instructions (Signed)
Medication Instructions:  Your physician recommends that you continue on your current medications as directed. Please refer to the Current Medication list given to you today.  *If you need a refill on your cardiac medications before your next appointment, please call your pharmacy*  Lab Work: FASTING LP/CMET/TSH  SOON   If you have labs (blood work) drawn today and your tests are completely normal, you will receive your results only by: [name] MyChart Message (if you have MyChart) OR . A paper copy in the mail If you have any lab test that is abnormal or we need to change your treatment, we will call you to review the results.  Testing/Procedures: NONE  Follow-Up: At [facility], you and your health needs are our priority.  As part of our continuing mission to provide you with exceptional heart care, we have created designated Provider Care Teams.  These Care Teams include your primary Cardiologist (physician) and Advanced Practice Providers (APPs -  Physician Assistants and Nurse Practitioners) who all work together to provide you with the care you need, when you need it.  We recommend signing up for the patient portal called "MyChart".  Sign up information is provided on this After Visit Summary.  MyChart is used to connect with patients for Virtual Visits (Telemedicine).  Patients are able to view lab/test results, encounter notes, upcoming appointments, etc.  Non-urgent messages can be sent to your provider as well.   To learn more about what you can do with MyChart, go to [email or web address].    Your next appointment:   6 month(s)  You will receive a reminder letter in the mail two months in advance. If you don't receive a letter, please call our office to schedule the follow-up appointment.   The format for your next appointment:   In Person  Provider:   You may see Skeet Latch, MD or one of the following Advanced Practice Providers on your designated Care Team:    Kerin Ransom, PA-C  [place], [place]  Coletta Memos, [place]

## 2019-12-02 LAB — COMPREHENSIVE METABOLIC PANEL
ALT: 16 IU/L (ref 0–32)
AST: 18 IU/L (ref 0–40)
Albumin/Globulin Ratio: 1.2 (ref 1.2–2.2)
Albumin: 4.1 g/dL (ref 3.8–4.8)
Alkaline Phosphatase: 120 IU/L (ref 44–121)
BUN/Creatinine Ratio: 23 (ref 12–28)
BUN: 28 mg/dL — ABNORMAL HIGH (ref 8–27)
Bilirubin Total: 0.5 mg/dL (ref 0.0–1.2)
CO2: 25 mmol/L (ref 20–29)
Calcium: 9.8 mg/dL (ref 8.7–10.3)
Chloride: 103 mmol/L (ref 96–106)
Creatinine, Ser: 1.21 mg/dL — ABNORMAL HIGH (ref 0.57–1.00)
GFR calc Af Amer: 56 mL/min/{1.73_m2} — ABNORMAL LOW (ref >59)
GFR calc non Af Amer: 48 mL/min/{1.73_m2} — ABNORMAL LOW (ref >59)
Globulin, Total: 3.5 g/dL (ref 1.5–4.5)
Glucose: 90 mg/dL (ref 65–99)
Potassium: 5 mmol/L (ref 3.5–5.2)
Sodium: 142 mmol/L (ref 134–144)
Total Protein: 7.6 g/dL (ref 6.0–8.5)

## 2019-12-02 LAB — LIPID PANEL
Chol/HDL Ratio: 4.1 ratio (ref 0.0–4.4)
Cholesterol, Total: 238 mg/dL — ABNORMAL HIGH (ref 100–199)
HDL: 58 mg/dL (ref >39)
LDL Chol Calc (NIH): 155 mg/dL — ABNORMAL HIGH (ref 0–99)
Triglycerides: 142 mg/dL (ref 0–149)
VLDL Cholesterol Cal: 25 mg/dL (ref 5–40)

## 2019-12-02 LAB — TSH: TSH: 1.86 u[IU]/mL (ref 0.450–4.500)

## 2019-12-09 ENCOUNTER — Telehealth: Payer: Self-pay | Admitting: Cardiovascular Disease

## 2019-12-09 NOTE — Telephone Encounter (Signed)
Follow Up:      Pt would like her lab results from 12-01-19 Pt says she feels tired and sluggish.

## 2019-12-09 NOTE — Telephone Encounter (Signed)
Returned call to patient. Relayed that her thyroid function was normal, but her cholesterol was above goal. She would like to try diet and exercise for three months. Recheck lipids in three months. I also relayed that her renal function was mildly reduced. K 5.0. She is only taking 6 potassium capsules instead of 9. I agreed with this. I will fax those results to her nephrologist Dr. Hollie Salk.

## 2019-12-11 ENCOUNTER — Other Ambulatory Visit: Payer: Self-pay | Admitting: Gastroenterology

## 2019-12-13 ENCOUNTER — Telehealth: Payer: Self-pay | Admitting: *Deleted

## 2019-12-13 DIAGNOSIS — I10 Essential (primary) hypertension: Secondary | ICD-10-CM

## 2019-12-13 DIAGNOSIS — E7800 Pure hypercholesterolemia, unspecified: Secondary | ICD-10-CM

## 2019-12-13 DIAGNOSIS — Z5181 Encounter for therapeutic drug level monitoring: Secondary | ICD-10-CM

## 2019-12-13 MED ORDER — ROSUVASTATIN CALCIUM 20 MG PO TABS
20.0000 mg | ORAL_TABLET | Freq: Every day | ORAL | 3 refills | Status: DC
Start: 2019-12-13 — End: 2020-10-27

## 2019-12-13 NOTE — Telephone Encounter (Signed)
Advised patient, verbalized understanding  Lab orders mailed to patient

## 2019-12-13 NOTE — Telephone Encounter (Signed)
-----   Message from Skeet Latch, MD sent at 12/13/2019  1:17 PM EST ----- Cholesterol levels are elevated higher than they should be.  Given that she has coronary artery disease her LDL should be less than 70.  Recommend starting rosuvastatin 20 mg daily.  Repeat lipids and a CMP in 3 months.

## 2019-12-18 ENCOUNTER — Emergency Department (HOSPITAL_COMMUNITY): Payer: 59

## 2019-12-18 ENCOUNTER — Emergency Department (HOSPITAL_COMMUNITY)
Admission: EM | Admit: 2019-12-18 | Discharge: 2019-12-18 | Disposition: A | Discharge status: Home / Self Care | Payer: 59 | Attending: Emergency Medicine | Admitting: Emergency Medicine

## 2019-12-18 ENCOUNTER — Other Ambulatory Visit: Payer: Self-pay

## 2019-12-18 DIAGNOSIS — Z79899 Other long term (current) drug therapy: Secondary | ICD-10-CM | POA: Diagnosis not present

## 2019-12-18 DIAGNOSIS — Z91040 Latex allergy status: Secondary | ICD-10-CM | POA: Insufficient documentation

## 2019-12-18 DIAGNOSIS — I5033 Acute on chronic diastolic (congestive) heart failure: Secondary | ICD-10-CM | POA: Diagnosis not present

## 2019-12-18 DIAGNOSIS — R109 Unspecified abdominal pain: Secondary | ICD-10-CM | POA: Insufficient documentation

## 2019-12-18 DIAGNOSIS — J441 Chronic obstructive pulmonary disease with (acute) exacerbation: Secondary | ICD-10-CM | POA: Insufficient documentation

## 2019-12-18 DIAGNOSIS — N1832 Chronic kidney disease, stage 3b: Secondary | ICD-10-CM | POA: Diagnosis not present

## 2019-12-18 DIAGNOSIS — I2510 Atherosclerotic heart disease of native coronary artery without angina pectoris: Secondary | ICD-10-CM | POA: Insufficient documentation

## 2019-12-18 DIAGNOSIS — I130 Hypertensive heart and chronic kidney disease with heart failure and stage 1 through stage 4 chronic kidney disease, or unspecified chronic kidney disease: Secondary | ICD-10-CM | POA: Diagnosis not present

## 2019-12-18 LAB — URINALYSIS, ROUTINE W REFLEX MICROSCOPIC
Bilirubin Urine: NEGATIVE
Glucose, UA: NEGATIVE mg/dL
Hgb urine dipstick: NEGATIVE
Ketones, ur: NEGATIVE mg/dL
Leukocytes,Ua: NEGATIVE
Nitrite: NEGATIVE
Protein, ur: NEGATIVE mg/dL
Specific Gravity, Urine: 1.013 (ref 1.005–1.030)
pH: 5 (ref 5.0–8.0)

## 2019-12-18 LAB — CBC WITH DIFFERENTIAL/PLATELET
Abs Immature Granulocytes: 0.07 10*3/uL (ref 0.00–0.07)
Basophils Absolute: 0 10*3/uL (ref 0.0–0.1)
Basophils Relative: 0 %
Eosinophils Absolute: 0.4 10*3/uL (ref 0.0–0.5)
Eosinophils Relative: 4 %
HCT: 34.1 % — ABNORMAL LOW (ref 36.0–46.0)
Hemoglobin: 10.9 g/dL — ABNORMAL LOW (ref 12.0–15.0)
Immature Granulocytes: 1 %
Lymphocytes Relative: 35 %
Lymphs Abs: 3.4 10*3/uL (ref 0.7–4.0)
MCH: 31.1 pg (ref 26.0–34.0)
MCHC: 32 g/dL (ref 30.0–36.0)
MCV: 97.2 fL (ref 80.0–100.0)
Monocytes Absolute: 0.8 10*3/uL (ref 0.1–1.0)
Monocytes Relative: 9 %
Neutro Abs: 4.8 10*3/uL (ref 1.7–7.7)
Neutrophils Relative %: 51 %
Platelets: 261 10*3/uL (ref 150–400)
RBC: 3.51 MIL/uL — ABNORMAL LOW (ref 3.87–5.11)
RDW: 14.1 % (ref 11.5–15.5)
WBC: 9.5 10*3/uL (ref 4.0–10.5)
nRBC: 0 % (ref 0.0–0.2)

## 2019-12-18 LAB — COMPREHENSIVE METABOLIC PANEL
ALT: 20 U/L (ref 0–44)
AST: 26 U/L (ref 15–41)
Albumin: 3.4 g/dL — ABNORMAL LOW (ref 3.5–5.0)
Alkaline Phosphatase: 81 U/L (ref 38–126)
Anion gap: 12 (ref 5–15)
BUN: 23 mg/dL (ref 8–23)
CO2: 21 mmol/L — ABNORMAL LOW (ref 22–32)
Calcium: 8.8 mg/dL — ABNORMAL LOW (ref 8.9–10.3)
Chloride: 107 mmol/L (ref 98–111)
Creatinine, Ser: 1.17 mg/dL — ABNORMAL HIGH (ref 0.44–1.00)
GFR, Estimated: 53 mL/min — ABNORMAL LOW (ref >60)
Glucose, Bld: 115 mg/dL — ABNORMAL HIGH (ref 70–99)
Potassium: 3.5 mmol/L (ref 3.5–5.1)
Sodium: 140 mmol/L (ref 135–145)
Total Bilirubin: 0.5 mg/dL (ref 0.3–1.2)
Total Protein: 7.4 g/dL (ref 6.5–8.1)

## 2019-12-18 LAB — LIPASE, BLOOD: Lipase: 37 U/L (ref 11–51)

## 2019-12-18 MED ORDER — MORPHINE SULFATE (PF) 4 MG/ML IV SOLN
4.0000 mg | Freq: Once | INTRAVENOUS | Status: AC
  Administered 2019-12-18: 4 mg via INTRAVENOUS
  Filled 2019-12-18: qty 1

## 2019-12-18 MED ORDER — LIDOCAINE 5 % EX PTCH
1.0000 | MEDICATED_PATCH | CUTANEOUS | Status: DC
  Administered 2019-12-18: 1 via TRANSDERMAL
  Filled 2019-12-18: qty 1

## 2019-12-18 MED ORDER — METHOCARBAMOL 500 MG PO TABS: 500.0000 mg | ORAL_TABLET | Freq: Two times a day (BID) | ORAL | 0 refills | Status: DC

## 2019-12-18 MED ORDER — ACETAMINOPHEN ER 650 MG PO TBCR: 650.0000 mg | EXTENDED_RELEASE_TABLET | Freq: Three times a day (TID) | ORAL | 0 refills | Status: DC | PRN

## 2019-12-18 NOTE — ED Provider Notes (Signed)
[facility] DEPT Provider Note   CSN: [id number] Arrival date & time: 12/18/19  0840     History Chief Complaint  Patient presents with  . Flank Pain    [name] is a 59 y.o. female with a past medical history significant for diastolic congestive heart failure anemia, hypertension, COPD, and CKD stage III presents to the ED due to gradual onset of worsening right-sided flank pain x4 days.  Patient describes pain as sharp and constant in nature.  Rates it a 8/10.  No aggravating or alleviating factors.  No history of previous kidney stones.  Denies history of blood clots, recent surgeries, recent long immobilizations, hormonal treatments, trauma, lower extremity edema, and history of cancer.  Denies overlying rash to right flank region.  [name] has been taking tramadol with moderate relief.  Denies urinary vaginal symptoms.  Denies injury to flank region.  Denies chest pain, shortness of breath, lower extremity edema, abdominal pain, nausea, vomiting, diarrhea, fever, chills  History obtained from patient and past medical records. No interpreter used during encounter.      Past Medical History:  Diagnosis Date  . Acute on chronic diastolic CHF (congestive heart failure) ([place]) 04/11/2014  . Acute suppurative otitis media without spontaneous rupture of eardrum 02/04/2007   Centricity Description: OTITIS MEDIA, SUPPURATIVE, ACUTE, BILATERAL Qualifier: Diagnosis of  By: [name] MD, [name]  Centricity Description: OTITIS MEDIA, SUPPURATIVE, ACUTE Qualifier: Diagnosis of  By: [name] MD, [name]   . Allergic rhinitis   . Allergy   . Anemia   . Anxiety   . Arthritis   . CAD (coronary artery disease)    LHC (11/29/1999):  EF 60%; no significant CAD.  [name] Carpal tunnel syndrome   . Carpal tunnel syndrome 06/09/2006   Qualifier: Diagnosis of  By: Marca Ancona RMA, Lucy    . Chronic diastolic CHF (congestive heart failure) ([place])    a. Echo (01/21/11): Vigorous LVF, EF  65-70%, no RWMA, Gr 2 DD. b.  Echo (12/15): Moderate LVH, EF 24-58%, grade 2 diastolic dysfunction, mild LAE, normal RV function c. 01/2015: echo showing EF of 60-65% with mild LVH  . Common migraine   . COPD (chronic obstructive pulmonary disease) ([place])   . Diastolic heart failure ([place])   . Diverticulitis   . Dyspnea   . GERD (gastroesophageal reflux disease)   . Hepatic steatosis   . Hepatomegaly   . Hiatal hernia   . History of cardiomegaly   . Hyperlipidemia   . Hypertension   . IBS (irritable bowel syndrome)   . Internal hemorrhoids   . Metabolic syndrome   . Morbid obesity ([place])   . OSA (obstructive sleep apnea)    CPAP dependent  . Pneumonia   . PNEUMONIA, ORGAN UNSPECIFIED 03/01/2009   Qualifier: Diagnosis of  By: [name] CMA, [name]    . Sinusitis, chronic 03/10/2012   CT sinuses 02/2012:  Chronic sinusitis with small A/F levels >> rx by ENT with prolonged augmentin F/u ct sinuses 04/2012:  Persistent sinus thickening >> rx with levaquin and clinda for 30 days.  Told to return for sinus scan in 6 weeks >> no showed for followup visit.  CT sinuses 11/2013:  Acute and chronic sinusitis noted.    . Sleep apnea    bi pap    Patient Active Problem List   Diagnosis Date Noted  . CAP (community acquired pneumonia) 11/11/2018  . Abdominal pain, chronic, epigastric   . Abdominal pain   .  Gastritis and gastroduodenitis   . Bronchitis 02/12/2018  . Polyp of sigmoid colon   . CKD (chronic kidney disease) stage 3, GFR 30-59 ml/min (HCC) 07/08/2016  . Cough variant asthma 07/31/2015  . Upper airway cough syndrome 04/11/2015  . Bronchitis, chronic obstructive w acute bronchitis ([place]) 04/11/2015  . Dyspnea   . Acute on chronic diastolic CHF (congestive heart failure), NYHA class 2 ([place]) 03/27/2015  . Morbid obesity due to excess calories ([place]) 03/27/2015  . Dysphagia 03/27/2015  . Hyponatremia 03/27/2015  . Acute kidney injury ([place]) 03/27/2015  . Hypokalemia 03/27/2015  .  Leukocytosis 03/27/2015  . COPD exacerbation ([facility] [place]) 03/27/2015  . Sinusitis, chronic 03/10/2012  . Hyperlipidemia 10/02/2006  . Obstructive sleep apnea 06/09/2006  . Essential hypertension 06/09/2006    Past Surgical History:  Procedure Laterality Date  . ABDOMINAL HYSTERECTOMY    . BIOPSY  06/22/2018   Procedure: BIOPSY;  Surgeon: Mauri Pole, MD;  Location: [facility];  Service: Endoscopy;;  . BIOPSY  07/28/2018   Procedure: BIOPSY;  Surgeon: Mauri Pole, MD;  Location: [facility];  Service: Endoscopy;;  . CESAREAN SECTION    . COLONOSCOPY    . COLONOSCOPY WITH PROPOFOL N/A 05/05/2017   Procedure: COLONOSCOPY WITH PROPOFOL;  Surgeon: Mauri Pole, MD;  Location: [facility];  Service: Endoscopy;  Laterality: N/A;  . ESOPHAGOGASTRODUODENOSCOPY (EGD) WITH PROPOFOL N/A 06/22/2018   Procedure: ESOPHAGOGASTRODUODENOSCOPY (EGD) WITH PROPOFOL;  Surgeon: Mauri Pole, MD;  Location: [facility];  Service: Endoscopy;  Laterality: N/A;  . ESOPHAGOGASTRODUODENOSCOPY (EGD) WITH PROPOFOL N/A 07/28/2018   Procedure: ESOPHAGOGASTRODUODENOSCOPY (EGD) WITH PROPOFOL;  Surgeon: Mauri Pole, MD;  Location: [facility];  Service: Endoscopy;  Laterality: N/A;  . NASAL SINUS SURGERY    . PARTIAL HYSTERECTOMY    . SINUS ENDO WITH FUSION Left 05/28/2019   Procedure: SINUS ENDO WITH FUSION;  Surgeon: [name], MD;  Location: [facility];  Service: ENT;  Laterality: Left;  . TONSILLECTOMY    . TUBAL LIGATION    . TURBINATE REDUCTION Bilateral 05/28/2019   Procedure: TURBINATE REDUCTION;  Surgeon: [name], MD;  Location: [facility];  Service: ENT;  Laterality: Bilateral;  . UVULOPALATOPHARYNGOPLASTY       OB History   No obstetric history on file.     Family History  Problem Relation Age of Onset  . Heart disease Mother   . Heart attack Mother   . Hypertension Mother   . Coronary artery disease Other        1st degree relatvie <50  . Breast cancer Other         aunt- ? paternal or maternal  . Asthma Sister   . Colon polyps Sister   . Stroke Neg Hx   . Colon cancer Neg Hx   . Esophageal cancer Neg Hx   . Rectal cancer Neg Hx   . Stomach cancer Neg Hx     Social History   Tobacco Use  . Smoking status: Never Smoker  . Smokeless tobacco: Never Used  Vaping Use  . Vaping Use: Never used  Substance Use Topics  . Alcohol use: No  . Drug use: No    Home Medications Prior to Admission medications   Medication Sig Start Date End Date Taking? Authorizing Provider  albuterol (PROVENTIL) (2.5 MG/3ML) 0.083% nebulizer solution USE 1 VIAL IN NEBULIZER EVERY 4 HOURS - and as needed Patient taking differently: Take 2.5 mg by nebulization every 4 (four) hours as needed for shortness of  breath.  07/19/19  Yes Lauraine Rinne, NP  carvedilol (COREG) 12.5 MG tablet TAKE 1 TABLET BY MOUTH TWICE A DAY WITH FOOD Patient taking differently: Take 12.5 mg by mouth 2 (two) times daily with a meal.  06/28/19  Yes Skeet Latch, MD  cholecalciferol (VITAMIN D3) 25 MCG (1000 UNIT) tablet Take 1,000 Units by mouth daily.   Yes [provider]  dexlansoprazole (DEXILANT) 60 MG capsule Take 1 capsule (60 mg total) by mouth every morning. Take 30 minutes before breakast 11/27/18  Yes Nandigam, Venia Minks, MD  hydrALAZINE (APRESOLINE) 25 MG tablet Take 0.5 tablets (12.5 mg total) by mouth 3 (three) times daily. 01/13/19  Yes Skeet Latch, MD  isosorbide dinitrate (ISORDIL) 20 MG tablet Take 1 tablet (20 mg total) by mouth 3 (three) times daily. 02/28/19  Yes Skeet Latch, MD  levocetirizine (XYZAL) 5 MG tablet Take 5 mg by mouth every evening.   Yes [provider]  losartan (COZAAR) 50 MG tablet Take 1 tablet (50 mg total) by mouth daily. 08/09/19  Yes Skeet Latch, MD  lubiprostone (AMITIZA) 24 MCG capsule TAKE 1 CAPSULE BY MOUTH TWICE A DAY WITH MEALS Patient taking differently: Take 24 mcg by mouth 2 (two) times daily with a meal.  12/13/19   Yes Nandigam, Venia Minks, MD  meloxicam (MOBIC) 15 MG tablet Take 15 mg by mouth daily. 07/19/19  Yes [provider]  metolazone (ZAROXOLYN) 2.5 MG tablet Take 1 tablet (2.5 mg total) by mouth daily as needed (FOR OVERNIGHT WEIGHT GAIN OF 3 lbs. or more). 02/28/19  Yes Skeet Latch, MD  montelukast (SINGULAIR) 10 MG tablet TAKE 1 TABLET(10 MG) BY MOUTH AT BEDTIME Patient taking differently: Take 10 mg by mouth at bedtime.  07/25/17  Yes Tanda Rockers, MD  Potassium Chloride ER 20 MEQ TBCR TAKE 3 TABLETS BY MOUTH 3 TIMES A DAY Patient taking differently: Take 60 mEq by mouth 3 (three) times daily. TAKE 3 TABLETS BY MOUTH 3 TIMES A DAY 07/01/19  Yes Skeet Latch, MD  Respiratory Therapy Supplies (FLUTTER) [place] 1 application by Does not apply route as directed. 12/27/16  Yes Tanda Rockers, MD  spironolactone (ALDACTONE) 25 MG tablet Take 0.5 tablets (12.5 mg total) by mouth 2 (two) times daily. 02/25/19  Yes Skeet Latch, MD  topiramate (TOPAMAX) 25 MG tablet Take 25 mg by mouth 2 (two) times daily as needed (for migraines).    Yes [provider]  torsemide (DEMADEX) 20 MG tablet Take 2 tablets (40 mg total) by mouth 2 (two) times daily. 05/17/19  Yes Skeet Latch, MD  traMADol (ULTRAM) 50 MG tablet Take 50 mg by mouth 3 (three) times daily as needed for moderate pain.  07/08/19  Yes [provider]  vitamin C (ASCORBIC ACID) 250 MG tablet Take 250 mg by mouth daily.   Yes [provider]  vitamin E 1000 UNIT capsule Take 1,000 Units by mouth daily.   Yes [provider]  acetaminophen (TYLENOL 8 HOUR) 650 MG CR tablet Take 1 tablet (650 mg total) by mouth every 8 (eight) hours as needed for pain. 12/18/19   Suzy Bouchard, PA-C  methocarbamol (ROBAXIN) 500 MG tablet Take 1 tablet (500 mg total) by mouth 2 (two) times daily. 12/18/19   Suzy Bouchard, PA-C  rosuvastatin (CRESTOR) 20 MG tablet Take 1 tablet (20 mg total) by mouth  daily. 12/13/19 03/12/20  Skeet Latch, MD  potassium chloride SA (K-DUR,KLOR-CON) 20 MEQ tablet TAKE 3  TABLETS (60 MEQ) BY MOUTH THREE TIMES DAILY Patient taking differently: Take 180 mEq by mouth daily.  04/27/18 02/24/19  Skeet Latch, MD    Allergies    Sulfonamide derivatives, Doxycycline, Latex, Ciprofloxacin, Fish allergy, Hydrocortisone, and Neomycin  Review of Systems   Review of Systems  Constitutional: Negative for chills and fever.  Gastrointestinal: Negative for abdominal pain, diarrhea, nausea and vomiting.  Genitourinary: Positive for flank pain. Negative for dysuria, hematuria and pelvic pain.  Skin: Negative for rash.  All other systems reviewed and are negative.   Physical Exam Updated Vital Signs BP 115/72   Pulse (!) 52   Temp 98.1 F (36.7 C) (Oral)   Resp 13   Ht 5\' 3"  (1.6 m)   Wt 134.8 kg   SpO2 97%   BMI 52.65 kg/m   Physical Exam Vitals and nursing note reviewed.  Constitutional:      General: [name] is not in acute distress.    Appearance: [name] is not ill-appearing.  HENT:     Head: Normocephalic.  Eyes:     Pupils: Pupils are equal, round, and reactive to light.  Cardiovascular:     Rate and Rhythm: Normal rate and regular rhythm.     Pulses: Normal pulses.     Heart sounds: Normal heart sounds. No murmur heard.  No friction rub. No gallop.   Pulmonary:     Effort: Pulmonary effort is normal.     Breath sounds: Normal breath sounds.  Abdominal:     General: Abdomen is flat. Bowel sounds are normal. There is no distension.     Palpations: Abdomen is soft.     Tenderness: There is abdominal tenderness. There is no right CVA tenderness, left CVA tenderness or guarding.     Comments: Mild tenderness in right flank region.   Musculoskeletal:     Cervical back: Neck supple.     Comments: No lower extremity edema.  Negative Homan sign bilaterally.  Skin:    General: Skin is warm and dry.     Comments: No overlying rash in right flank  region.  Neurological:     General: No focal deficit present.     Mental Status: [name] is alert.  Psychiatric:        Mood and Affect: Mood normal.        Behavior: Behavior normal.     ED Results / Procedures / Treatments   Labs (all labs ordered are listed, but only abnormal results are displayed) Labs Reviewed  CBC WITH DIFFERENTIAL/PLATELET - Abnormal; Notable for the following components:      Result Value   RBC 3.51 (*)    Hemoglobin 10.9 (*)    HCT 34.1 (*)    All other components within normal limits  COMPREHENSIVE METABOLIC PANEL - Abnormal; Notable for the following components:   CO2 21 (*)    Glucose, Bld 115 (*)    Creatinine, Ser 1.17 (*)    Calcium 8.8 (*)    Albumin 3.4 (*)    GFR, Estimated 53 (*)    All other components within normal limits  URINE CULTURE  LIPASE, BLOOD  URINALYSIS, ROUTINE W REFLEX MICROSCOPIC    EKG None  Radiology DG Chest Portable 1 View  Result Date: 12/18/2019 CLINICAL DATA:  Right-sided chest pain EXAM: PORTABLE CHEST 1 VIEW COMPARISON:  November 25, 2018 chest radiograph; chest CT March 18, 2019 FINDINGS: Lungs are clear. Heart is mildly enlarged with pulmonary vascularity normal. No adenopathy. No pneumothorax. No  bone lesions. IMPRESSION: Mild cardiomegaly.  Lungs clear. Electronically Signed   By: Lowella Grip III M.D.   On: 12/18/2019 12:24   CT Renal Stone Study  Result Date: 12/18/2019 CLINICAL DATA:  Right flank pain for 4 days EXAM: CT ABDOMEN AND PELVIS WITHOUT CONTRAST TECHNIQUE: Multidetector CT imaging of the abdomen and pelvis was performed following the standard protocol without IV contrast. COMPARISON:  06/02/2018, 11/02/2009 FINDINGS: Lower chest: No acute abnormality. Hepatobiliary: Unremarkable unenhanced appearance of the liver. No focal liver lesion identified. Gallbladder within normal limits. No hyperdense gallstone. No biliary dilatation. Pancreas: Unremarkable. No pancreatic ductal dilatation or  surrounding inflammatory changes. Spleen: Normal in size without focal abnormality. Adrenals/Urinary Tract: Unremarkable adrenal glands. Bilateral kidneys an unremarkable noncontrast appearance. No renal stone or hydronephrosis. Coarse calcification adjacent to but separate from the proximal right ureter is again noted, unchanged in appearance from prior CT of 2011. No ureteral calculi. Urinary bladder is largely decompressed but appears otherwise unremarkable. Stomach/Bowel: Stomach is within normal limits. Appendix appears normal (series 4, image 79). No evidence of bowel wall thickening, distention, or inflammatory changes. Vascular/Lymphatic: No significant vascular findings are present. No enlarged abdominal or pelvic lymph nodes. Reproductive: Status post hysterectomy. No adnexal masses. Other: No free fluid. No abdominopelvic fluid collection. No pneumoperitoneum. Unchanged appearance of a fat containing umbilical hernia. Musculoskeletal: No acute or significant osseous findings. IMPRESSION: 1. No acute abdominopelvic findings. Specifically, no evidence of obstructive uropathy. 2. Unchanged appearance of a fat containing umbilical hernia. Electronically Signed   By: Davina Poke D.O.   On: 12/18/2019 09:52    Procedures Procedures (including critical care time)  Medications Ordered in ED Medications  lidocaine (LIDODERM) 5 % 1 patch (1 patch Transdermal Patch Applied 12/18/19 1138)  morphine 4 MG/ML injection 4 mg (4 mg Intravenous Given 12/18/19 0949)    ED Course  I have reviewed the triage vital signs and the nursing notes.  Pertinent labs & imaging results that were available during my care of the patient were reviewed by me and considered in my medical decision making (see chart for details).    MDM Rules/Calculators/A&P                         59 year old female presents to the ED due to right flank pain x4 days.  No history of previous kidney stones.  Denies injury to right  flank region.  Upon arrival, vitals all within normal limits.  Patient is afebrile, not tachycardic or hypoxic.  Patient no acute distress and non-ill-appearing.  Physical exam reassuring.  Tenderness to palpation in right flank region.  No overlying rash to suggest shingles.  No lower extremity edema.  Negative Homans' sign bilaterally.  Low suspicion for DVT/PE given no signs of DVT on exam, no hypoxia or tachycardia. No respiratory symptoms to suggest pneumonia.  Will obtain routine labs, UA, CT renal study to rule out kidney stone.  Morphine given for pain management.  CBC reassuring with no leukocytosis.  Mild anemia with hemoglobin at 10.9 which appears to be around patient's baseline.  CMP reassuring.  Elevation creatinine at 1.17 which appears around patient's baseline.  Lipase normal at 37.  Doubt pancreatitis.  UA unremarkable no signs of infection.  CT renal study personally reviewed which demonstrates: IMPRESSION:  1. No acute abdominopelvic findings. Specifically, no evidence of  obstructive uropathy.  2. Unchanged appearance of a fat containing umbilical hernia.   CXR personally reviewed which demonstrates cardiomegaly, but  no signs of pneumonia. Suspect symptoms could be related to MSK etiology or possible early onset of shingles. Will treat symptomatically at this time Tylenol, muscle relaxer, and lidoderm patches.  Instructed patient to follow-up with PCP symptoms not improved within the next week. Strict ED precautions discussed with patient. Patient states understanding and agrees to plan. Patient discharged home in no acute distress and stable vitals.  Final Clinical Impression(s) / ED Diagnoses Final diagnoses:  Right flank pain    Rx / DC Orders ED Discharge Orders         Ordered    acetaminophen (TYLENOL 8 HOUR) 650 MG CR tablet  Every 8 hours PRN        12/18/19 1123    methocarbamol (ROBAXIN) 500 MG tablet  2 times daily        12/18/19 1123           Karie Kirks 12/18/19 1240    Milton Ferguson, MD 12/19/19 [id number]

## 2019-12-18 NOTE — ED Triage Notes (Signed)
Patient reports right sided flank pain x4 days. Pain is dull and sharp, constant, rated 8/10. Patient says she has history of kidney problems and has cyst on right kidney. Says she takes lasix.

## 2019-12-18 NOTE — ED Notes (Signed)
Pt able to ambulate to restroom without assistance.  

## 2019-12-18 NOTE — Discharge Instructions (Addendum)
As discussed, all of your labs are reassuring today.  Your CT scan did not show any kidney stones.  I am sending you home with a pain medication and muscle relaxer.  Muscle relaxers can cause drowsiness do not drive or operate machinery while on the medication.  Please follow-up with your PCP within the next week for further evaluation.  Return to the ER for new or worsening symptoms.

## 2019-12-20 ENCOUNTER — Ambulatory Visit: Payer: 59 | Admitting: Pulmonary Disease

## 2019-12-20 ENCOUNTER — Other Ambulatory Visit: Payer: Self-pay

## 2019-12-20 LAB — URINE CULTURE: Culture: 40000 — AB

## 2019-12-20 MED ORDER — POTASSIUM CHLORIDE ER 20 MEQ PO TBCR
EXTENDED_RELEASE_TABLET | ORAL | 3 refills | Status: DC
Start: 2019-12-20 — End: 2020-07-03

## 2019-12-21 ENCOUNTER — Encounter: Payer: Self-pay | Admitting: Cardiovascular Disease

## 2019-12-21 ENCOUNTER — Telehealth: Payer: Self-pay | Admitting: Emergency Medicine

## 2019-12-21 NOTE — Progress Notes (Signed)
ED Antimicrobial Stewardship Positive Culture Follow Up   [name] is an 59 y.o. female who presented to [facility] on 12/18/2019 with a chief complaint of  Chief Complaint  Patient presents with  . Flank Pain    Recent Results (from the past 720 hour(s))  Urine culture     Status: Abnormal   Collection Time: 12/18/19  9:49 AM   Specimen: Urine, Random  Result Value Ref Range Status   Specimen Description   Final    URINE, RANDOM Performed at [facility], [place] [address], [place], [place] [address]    Special Requests   Final    NONE Performed at [facility], [place] [address] [place], [place] [address]    Culture 40,000 COLONIES/mL ESCHERICHIA COLI (A)  Final   Report Status 12/20/2019 FINAL  Final   Organism ID, Bacteria ESCHERICHIA COLI (A)  Final      Susceptibility   Escherichia coli - MIC*    AMPICILLIN 4 SENSITIVE Sensitive     CEFAZOLIN <=4 SENSITIVE Sensitive     CEFEPIME <=0.12 SENSITIVE Sensitive     CEFTRIAXONE <=0.25 SENSITIVE Sensitive     CIPROFLOXACIN <=0.25 SENSITIVE Sensitive     GENTAMICIN <=1 SENSITIVE Sensitive     IMIPENEM <=0.25 SENSITIVE Sensitive     NITROFURANTOIN <=16 SENSITIVE Sensitive     TRIMETH/SULFA <=20 SENSITIVE Sensitive     AMPICILLIN/SULBACTAM <=2 SENSITIVE Sensitive     PIP/TAZO <=4 SENSITIVE Sensitive     * 40,000 COLONIES/mL ESCHERICHIA COLI    At ED, patient was only treated symptomatically for flank pain and instructed to follow-up with PCP if no improvement. Given unremarkable findings on imaging, lack of additional UTI symptoms, and low amount of bacteria in urine, no further treatment is necessary.  ED Provider: Arlean Hopping, PA-C   [name], PharmD Candidate 12/21/2019, 10:42 AM

## 2019-12-21 NOTE — Telephone Encounter (Signed)
Post ED Visit - Positive Culture Follow-up  Culture report reviewed by antimicrobial stewardship pharmacist: [facility] Team []  Elenor Quinones, Pharm.D. []  Heide Guile, Pharm.D., BCPS AQ-ID []  Parks Neptune, Pharm.D., BCPS []  Alycia Rossetti, Pharm.D., BCPS []  [place], Pharm.D., BCPS, AAHIVP []  Legrand Como, Pharm.D., BCPS, AAHIVP []  Salome Arnt, PharmD, BCPS []  Johnnette Gourd, PharmD, BCPS []  Hughes Better, PharmD, BCPS []  Leeroy Cha, PharmD []  Laqueta Linden, PharmD, BCPS []  Albertina Parr, PharmD  [facility] [facility] Team []  Leodis Sias, PharmD []  Lindell Spar, PharmD []  Royetta Asal, PharmD []  Graylin Shiver, Rph []  Rema Fendt) Glennon Mac, PharmD []  Arlyn Dunning, PharmD []  Netta Cedars, PharmD []  Dia Sitter, PharmD []  Leone Haven, PharmD []  Gretta Arab, PharmD []  Theodis Shove, PharmD []  Peggyann Juba, PharmD []  Reuel Boom, PharmD [name] PharmD candidate   Positive urine culture Asymptomatic, no further patient follow-up is required at this time.  Hazle Nordmann 12/21/2019, 11:12 AM

## 2019-12-28 ENCOUNTER — Ambulatory Visit: Payer: 59 | Admitting: Internal Medicine

## 2020-01-18 ENCOUNTER — Telehealth: Payer: Self-pay | Admitting: Internal Medicine

## 2020-01-18 NOTE — Telephone Encounter (Signed)
Called and spoke with patient, advised of recommendations.  She stated she has been using a heating pad since she got out of the hospital on Sunday 01/16/20 and has been taking Tramadol 50 mg, 2 tabs every 6 hours as needed.  The Tramadol is 50 mg and she takes 2 tabs.She was prescribed this for her knee.  She only has 4 tablets left and she is not due a refill until 01/27/2020.  She states when she left the hospital she was told to contact our office if she needed anything.  Dr. Maple Hudson, please advise.  Thank you.

## 2020-01-18 NOTE — Telephone Encounter (Signed)
I called and spoke with the patient, she is currently still in [place], [place] and will be there until the end of January or the 1st of February.  She can do a virtual visit if needed.  Her Hospital results are available in epic under care everywhere under [facility].  The pharmacy she is currently using while in [place] is [facility] located at [address] in [place], [place] [address].  I have listed this pharmacy in her pharmacy list.  She is not having any other pain than the knee pain that she was taking the tramadol for initially and now the chest muscle pain.  Advised patient that her [facility] would [facility] in [place] and we would be in touch regarding follow up.

## 2020-01-18 NOTE — Telephone Encounter (Signed)
The side pain and muscle spasms may be from coughing. Suggest a heating pad and Advil for these.  Please let us know tomorrow if no better.

## 2020-01-18 NOTE — Telephone Encounter (Signed)
The pharmacy she is currently using while in [place] [facility] at [address] in [place], [place] [address].  I have listed this pharmacy in her pharmacy list.    Dr. Maple Hudson, can you send in the Tramadol in to the pharmacy listed above.  Thank you.

## 2020-01-18 NOTE — Telephone Encounter (Signed)
Where does she want a tramadol refill sent? She would have had CXR in [place].  Can we get that hospital record? Is she having problems other than pain? May need to go to an Urgent Care. I could see her tomorrow at 11:30.

## 2020-01-18 NOTE — Telephone Encounter (Signed)
Called and spoke with pt and she stated that she went to [place] to her daughters house for christmas.  She was negative for covid on 12/19 prior to her leaving.  Her granddaughter came home from school on 12/20 sick and was exposed to a covid postive person in scouts. Pt stated on 12/24 she developed a cough and fever and went to the ER in [place] and tested postive for covid and was kept over the weekend.  She stated that she was discharged on 26, but since that time she is having pain in her left side and muscle spasms.  She was placed on prednisone x 5 days and combivent to use.  She tried to reach out to her [facility] but has not heard back from them.  The hospital told her to reach out to her pulmonologist as well.  SG is off this week.  Will forward to CY for any further recs.    Allergies  Allergen Reactions  . Sulfonamide Derivatives Anaphylaxis, Swelling, Rash and Other (See Comments)    Swelling tongue and ear   . Doxycycline Nausea And Vomiting  . Latex Hives  . Ciprofloxacin Rash  . Fish Allergy Rash and Other (See Comments)    Only reaction to Mackerel.  No other issues with any type of fish or shellfish currently   . Hydrocortisone Rash  . Neomycin Rash    Current Outpatient Medications on File Prior to Visit  Medication Sig Dispense Refill  . acetaminophen (TYLENOL 8 HOUR) 650 MG CR tablet Take 1 tablet (650 mg total) by mouth every 8 (eight) hours as needed for pain. 20 tablet 0  . albuterol (PROVENTIL) (2.5 MG/3ML) 0.083% nebulizer solution USE 1 VIAL IN NEBULIZER EVERY 4 HOURS - and as needed (Patient taking differently: Take 2.5 mg by nebulization every 4 (four) hours as needed for shortness of breath. ) 75 mL 11  . carvedilol (COREG) 12.5 MG tablet TAKE 1 TABLET BY MOUTH TWICE A DAY WITH FOOD (Patient taking differently: Take 12.5 mg by mouth 2 (two) times daily with a meal. ) 180 tablet 1  . cholecalciferol (VITAMIN D3) 25 MCG (1000 UNIT) tablet Take 1,000 Units by mouth daily.    [name]  dexlansoprazole (DEXILANT) 60 MG capsule Take 1 capsule (60 mg total) by mouth every morning. Take 30 minutes before breakast 90 capsule 3  . hydrALAZINE (APRESOLINE) 25 MG tablet Take 0.5 tablets (12.5 mg total) by mouth 3 (three) times daily. 270 tablet 1  . isosorbide dinitrate (ISORDIL) 20 MG tablet Take 1 tablet (20 mg total) by mouth 3 (three) times daily. 270 tablet 3  . levocetirizine (XYZAL) 5 MG tablet Take 5 mg by mouth every evening.    [name] losartan (COZAAR) 50 MG tablet Take 1 tablet (50 mg total) by mouth daily. 90 tablet 2  . lubiprostone (AMITIZA) 24 MCG capsule TAKE 1 CAPSULE BY MOUTH TWICE A DAY WITH MEALS (Patient taking differently: Take 24 mcg by mouth 2 (two) times daily with a meal. ) 180 capsule 3  . meloxicam (MOBIC) 15 MG tablet Take 15 mg by mouth daily.    . methocarbamol (ROBAXIN) 500 MG tablet Take 1 tablet (500 mg total) by mouth 2 (two) times daily. 20 tablet 0  . metolazone (ZAROXOLYN) 2.5 MG tablet Take 1 tablet (2.5 mg total) by mouth daily as needed (FOR OVERNIGHT WEIGHT GAIN OF 3 lbs. or more). 90 tablet 3  . montelukast (SINGULAIR) 10 MG tablet TAKE 1 TABLET(10 MG) BY  MOUTH AT BEDTIME (Patient taking differently: Take 10 mg by mouth at bedtime. ) 30 tablet 1  . Potassium Chloride ER 20 MEQ TBCR TAKE 3 TABLETS BY MOUTH 3 TIMES A DAY 270 tablet 3  . Respiratory Therapy Supplies (FLUTTER) DEVI 1 application by Does not apply route as directed. 1 each 0  . rosuvastatin (CRESTOR) 20 MG tablet Take 1 tablet (20 mg total) by mouth daily. 90 tablet 3  . spironolactone (ALDACTONE) 25 MG tablet Take 0.5 tablets (12.5 mg total) by mouth 2 (two) times daily. 90 tablet 3  . topiramate (TOPAMAX) 25 MG tablet Take 25 mg by mouth 2 (two) times daily as needed (for migraines).     . torsemide (DEMADEX) 20 MG tablet Take 2 tablets (40 mg total) by mouth 2 (two) times daily. 180 tablet 1  . traMADol (ULTRAM) 50 MG tablet Take 50 mg by mouth 3 (three) times daily as needed for  moderate pain.     . vitamin C (ASCORBIC ACID) 250 MG tablet Take 250 mg by mouth daily.    . vitamin E 1000 UNIT capsule Take 1,000 Units by mouth daily.    . [DISCONTINUED] potassium chloride SA (K-DUR,KLOR-CON) 20 MEQ tablet TAKE 3 TABLETS (60 MEQ) BY MOUTH THREE TIMES DAILY (Patient taking differently: Take 180 mEq by mouth daily. ) 270 tablet 0   No current facility-administered medications on file prior to visit.

## 2020-01-19 MED ORDER — TRAMADOL HCL 50 MG PO TABS: 50.0000 mg | ORAL_TABLET | Freq: Three times a day (TID) | ORAL | 1 refills | Status: DC | PRN

## 2020-01-19 NOTE — Telephone Encounter (Signed)
Tramadol refill sent 

## 2020-01-19 NOTE — Telephone Encounter (Signed)
I spoke with the pt and notified of response per Dr Maple Hudson and she verbalized understanding. Nothing further needed.

## 2020-01-28 ENCOUNTER — Telehealth: Payer: Self-pay | Admitting: Cardiovascular Disease

## 2020-01-28 MED ORDER — TORSEMIDE 20 MG PO TABS
40.0000 mg | ORAL_TABLET | Freq: Two times a day (BID) | ORAL | 3 refills | Status: DC
Start: 2020-01-28 — End: 2020-07-25

## 2020-01-28 MED ORDER — HYDRALAZINE HCL 25 MG PO TABS
12.5000 mg | ORAL_TABLET | Freq: Three times a day (TID) | ORAL | 3 refills | Status: DC
Start: 2020-01-28 — End: 2020-11-07

## 2020-01-28 NOTE — Telephone Encounter (Signed)
Refills sent to [facility] in [place], [place] per pt request.

## 2020-01-28 NOTE — Telephone Encounter (Signed)
Pt c/o medication issue:  1. Name of Medication: torsemide (DEMADEX) 20 MG tablet; hydrALAZINE (APRESOLINE) 25 MG tablet  2. How are you currently taking this medication (dosage and times per day)? As written  3. Are you having a reaction (difficulty breathing--STAT)? No  4. What is your medication issue? Patient needs a new prescription sent for both medications according to pharmacy. [id number] [place], [place]

## 2020-02-21 ENCOUNTER — Other Ambulatory Visit: Payer: Self-pay | Admitting: Gastroenterology

## 2020-02-21 ENCOUNTER — Ambulatory Visit: Payer: 59 | Admitting: Internal Medicine

## 2020-03-01 IMAGING — CR DG ABDOMEN ACUTE W/ 1V CHEST
4 series · 4 of 4 positions shown · non-contrast
Comparison: Chest x-ray dated 06/05/2018. CT and pelvis dated
06/02/2018.

CLINICAL DATA: Diarrhea

EXAM:
DG ABDOMEN ACUTE W/ 1V CHEST

[w chest pa]
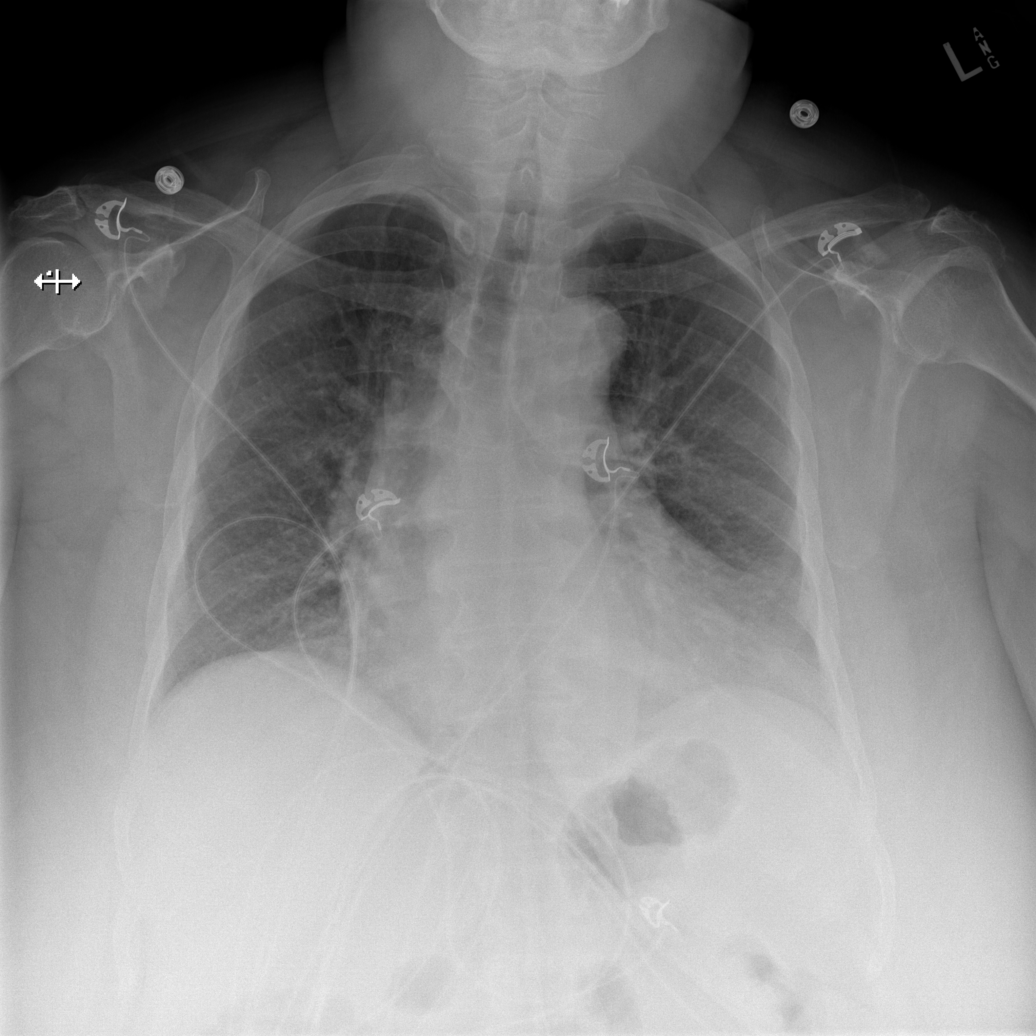

[w abdomen upright]
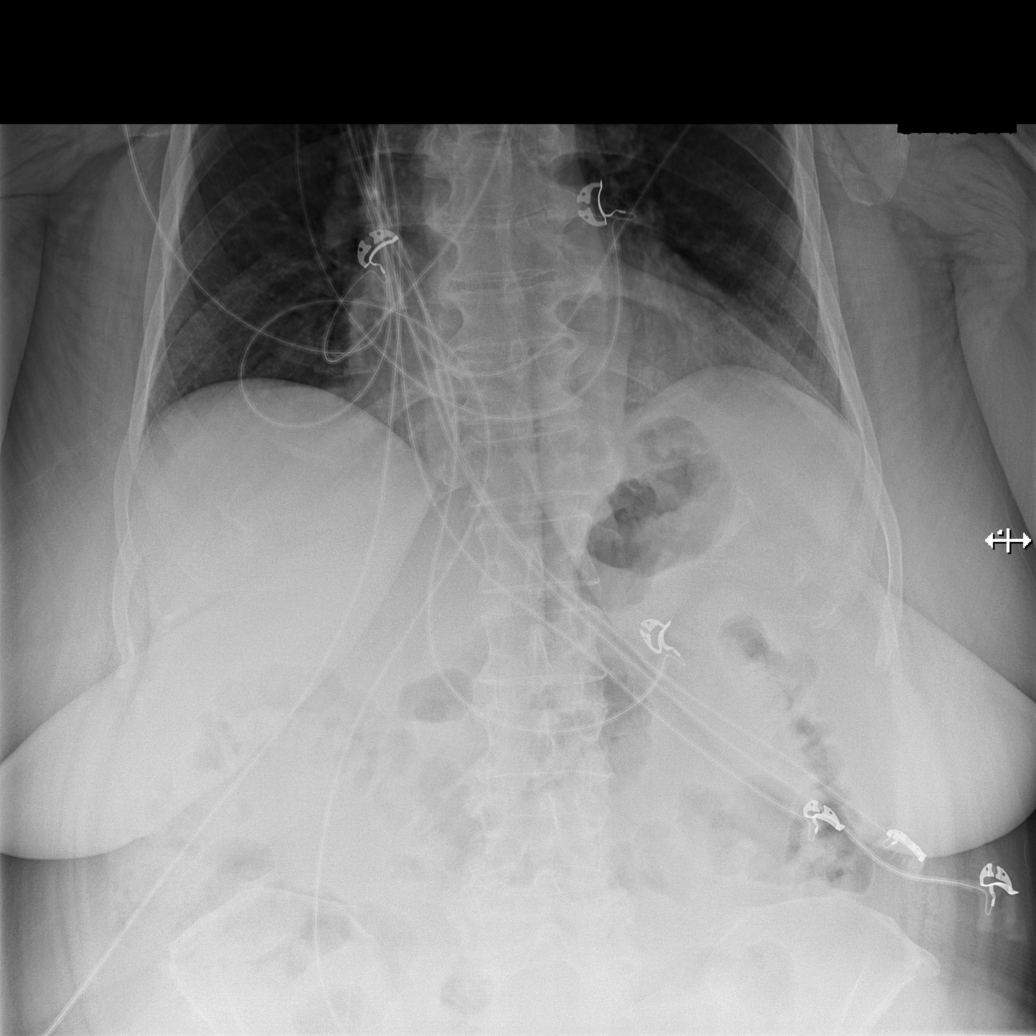

[t abdomen supine (1 of 2)]
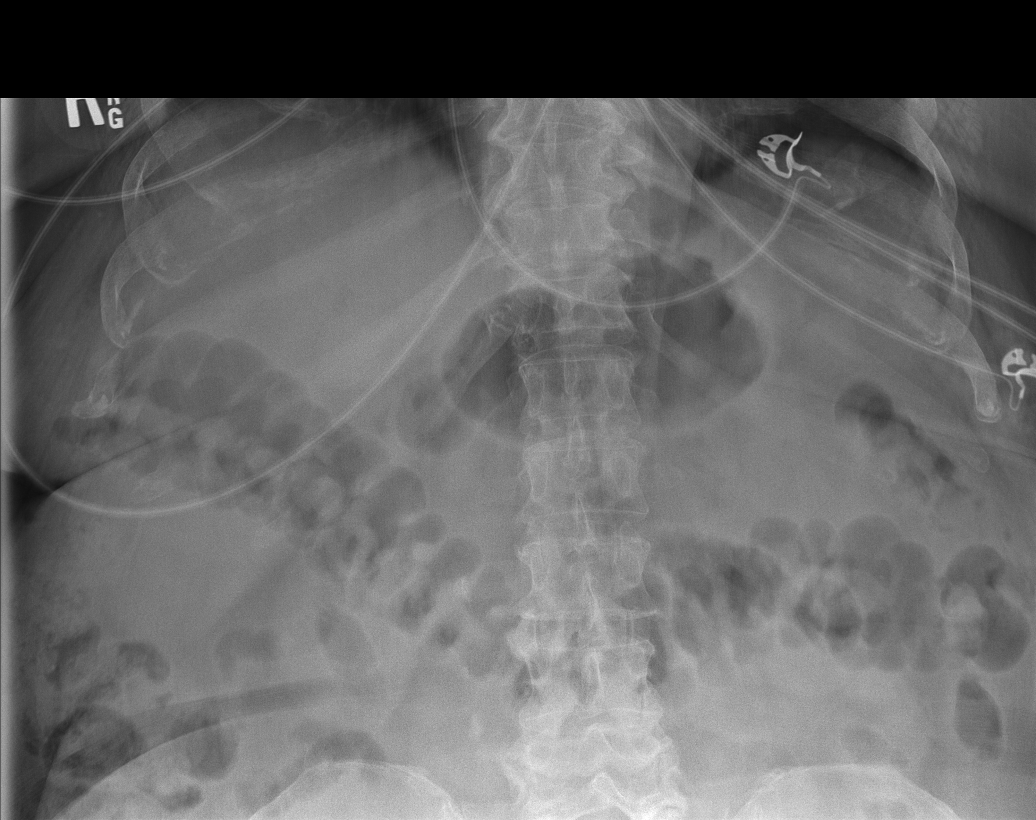

[t abdomen supine (2 of 2)]
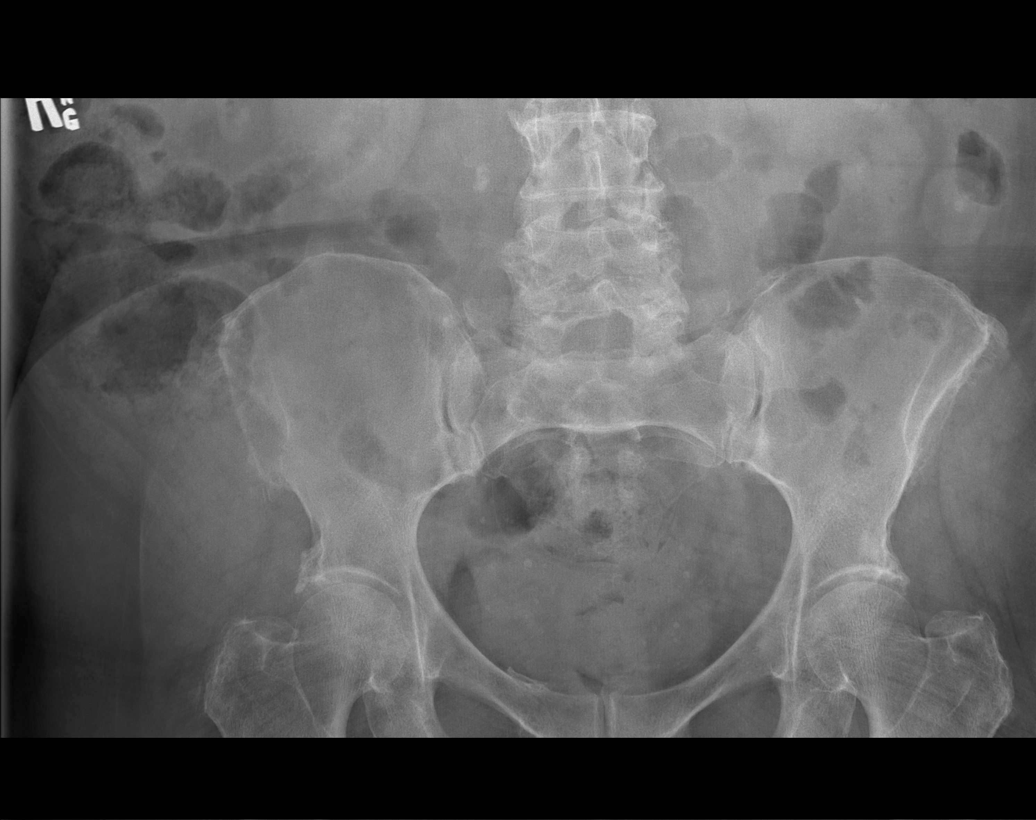

[4 of 4 positions shown; findings below may reference images not displayed]

FINDINGS: The cardiac silhouette is enlarged. There is no large pleural
effusion or pneumothorax. No significant area of consolidation. The
bowel gas pattern is nonspecific and nonobstructive. There is no
acute osseous abnormality. Calcification at the level of the lower
lumbar spine on the right represents a vascular calcification.
IMPRESSION: 1. Nonobstructive bowel gas pattern.
2. No acute cardiopulmonary process.

## 2020-03-27 ENCOUNTER — Other Ambulatory Visit: Payer: Self-pay

## 2020-03-27 ENCOUNTER — Telehealth: Payer: Self-pay | Admitting: Cardiovascular Disease

## 2020-03-27 MED ORDER — SPIRONOLACTONE 25 MG PO TABS
12.5000 mg | ORAL_TABLET | Freq: Two times a day (BID) | ORAL | 3 refills | Status: DC
Start: 2020-03-27 — End: 2020-11-02

## 2020-03-27 NOTE — Telephone Encounter (Signed)
*  STAT* If patient is at the pharmacy, call can be transferred to refill team.   1. Which medications need to be refilled? (please list name of each medication and dose if known)  spironolactone (ALDACTONE) 25 MG tablet  2. Which pharmacy/location (including street and city if local pharmacy) is medication to be sent to? [id number] [place], [place]  3. Do they need a 30 day or 90 day supply? 90  Patient is currently in [place]. Patient is out of medication

## 2020-04-19 ENCOUNTER — Other Ambulatory Visit: Payer: Self-pay | Admitting: Cardiovascular Disease

## 2020-04-19 ENCOUNTER — Telehealth: Payer: Self-pay | Admitting: Cardiovascular Disease

## 2020-04-19 MED ORDER — CARVEDILOL 12.5 MG PO TABS: 12.5000 mg | ORAL_TABLET | Freq: Two times a day (BID) | ORAL | 3 refills | Status: DC

## 2020-04-19 MED ORDER — ISOSORBIDE DINITRATE 20 MG PO TABS: 20.0000 mg | ORAL_TABLET | Freq: Three times a day (TID) | ORAL | 3 refills | Status: DC

## 2020-04-19 NOTE — Telephone Encounter (Signed)
*  STAT* If patient is at the pharmacy, call can be transferred to refill team.   1. Which medications need to be refilled? (please list name of each medication and dose if known)  carvedilol (COREG) 12.5 MG tablet isosorbide dinitrate (ISORDIL) 20 MG tablet  2. Which pharmacy/location (including street and city if local pharmacy) is medication to be sent to? [id number] [place], [place]  3. Do they need a 30 day or 90 day supply?  90 day supply  Patient is following up regarding previous refill request.  She states she forgot to mention she is now completely out of Isosorbide and Carvedilol.

## 2020-04-19 NOTE — Telephone Encounter (Signed)
*  STAT* If patient is at the pharmacy, call can be transferred to refill team.   1. Which medications need to be refilled? (please list name of each medication and dose if known)  Carvedilol   2. Which pharmacy/location (including street and city if local pharmacy) is medication to be sent to? [id number] [place], [place]  3. Do they need a 30 day or 90 day supply? 90 refill

## 2020-04-24 ENCOUNTER — Other Ambulatory Visit: Payer: Self-pay | Admitting: Gastroenterology

## 2020-04-24 ENCOUNTER — Telehealth: Payer: Self-pay | Admitting: Gastroenterology

## 2020-04-24 MED ORDER — DICYCLOMINE HCL 10 MG PO CAPS: 10.0000 mg | ORAL_CAPSULE | Freq: Three times a day (TID) | ORAL | 1 refills | Status: AC | PRN

## 2020-04-24 MED ORDER — DEXLANSOPRAZOLE 60 MG PO CPDR: 60.0000 mg | DELAYED_RELEASE_CAPSULE | ORAL | 1 refills | Status: DC

## 2020-04-24 NOTE — Telephone Encounter (Signed)
Pt is requesting a refill on her Dexilant and dicyclomine, pt is scheduled for 4/20.

## 2020-04-24 NOTE — Telephone Encounter (Signed)
Okay to send refill for dicyclomine 10 mg every 8 hours as needed, please schedule office follow-up visit next available appointment.  Thank you

## 2020-04-24 NOTE — Telephone Encounter (Signed)
Called patient and left her a message that I sent in [place] refills for her. I do not see that we have prescribed her dicyclomine and another doctor prescribed it in 2020. I left message that I would have to have the ok by Dr Silverio Decamp before I can send in the dicyclomine  She can call back with any questions   Dr Silverio Decamp can patient have a prescription of dicyclomine ?

## 2020-04-24 NOTE — Telephone Encounter (Signed)
Refills sent Patient has an appointment on 4/20 to see Alonza Bogus

## 2020-04-28 ENCOUNTER — Other Ambulatory Visit: Payer: Self-pay | Admitting: Family Medicine

## 2020-04-28 DIAGNOSIS — Z1231 Encounter for screening mammogram for malignant neoplasm of breast: Secondary | ICD-10-CM

## 2020-04-30 ENCOUNTER — Other Ambulatory Visit: Payer: Self-pay | Admitting: Gastroenterology

## 2020-04-30 ENCOUNTER — Other Ambulatory Visit: Payer: Self-pay | Admitting: Internal Medicine

## 2020-04-30 ENCOUNTER — Other Ambulatory Visit: Payer: Self-pay | Admitting: Cardiovascular Disease

## 2020-05-10 ENCOUNTER — Encounter: Payer: Self-pay | Admitting: Gastroenterology

## 2020-05-10 ENCOUNTER — Other Ambulatory Visit: Payer: Self-pay

## 2020-05-10 ENCOUNTER — Ambulatory Visit (INDEPENDENT_AMBULATORY_CARE_PROVIDER_SITE_OTHER): Payer: 59 | Admitting: Gastroenterology

## 2020-05-10 VITALS — BP 130/78 | HR 88 | Ht 62.75 in | Wt 288.0 lb

## 2020-05-10 DIAGNOSIS — K219 Gastro-esophageal reflux disease without esophagitis: Secondary | ICD-10-CM | POA: Diagnosis not present

## 2020-05-10 DIAGNOSIS — K5900 Constipation, unspecified: Secondary | ICD-10-CM | POA: Diagnosis not present

## 2020-05-10 DIAGNOSIS — K588 Other irritable bowel syndrome: Secondary | ICD-10-CM | POA: Diagnosis not present

## 2020-05-10 MED ORDER — TRULANCE 3 MG PO TABS: 3.0000 mg | ORAL_TABLET | Freq: Every day | ORAL | 5 refills | Status: DC

## 2020-05-10 MED ORDER — PANTOPRAZOLE SODIUM 40 MG PO TBEC: 40.0000 mg | DELAYED_RELEASE_TABLET | Freq: Every day | ORAL | 3 refills | Status: DC

## 2020-05-10 NOTE — Progress Notes (Signed)
05/10/2020 REIS PIENTA [id number] [birth year]   HISTORY OF PRESENT ILLNESS:  This is a 60 year old female with PMH listed below who is followed here by Dr. Silverio Decamp for GERD, IBS-C, chronic abdominal pain and gastroparesis (probably post-infectious as GES in 2020 was normal). She is on multiple medications (GI and other).  EGD in November 2020 for upper abdominal pain was remarkable for a large phytobezoar. Gastrc emptying study previous June was normal. Patient had been taking BID reglan, but she was told to discontinue that.  GERD symptoms under control as long as she takes Dexilant, but now her insurance will no longer cover it.  In regards to the constipation she says she is taking her Amitiza 24 mcg twice daily and using sometimes 5 doses of MiraLAX daily and it does not seem to be helping anymore.  She has tried Linzess but the 145 mcg and 290 mcg in the past.  Of note, she splits her time between here in [place], where she also receives some of her care.  Past Medical History:  Diagnosis Date  . Acute on chronic diastolic CHF (congestive heart failure) ([place]) 04/11/2014  . Acute suppurative otitis media without spontaneous rupture of eardrum 02/04/2007   Centricity Description: OTITIS MEDIA, SUPPURATIVE, ACUTE, BILATERAL Qualifier: Diagnosis of  By: [name] MD, [name]  Centricity Description: OTITIS MEDIA, SUPPURATIVE, ACUTE Qualifier: Diagnosis of  By: [name] MD, [name]   . Allergic rhinitis   . Allergy   . Anemia   . Anxiety   . Arthritis   . CAD (coronary artery disease)    LHC (11/29/1999):  EF 60%; no significant CAD.  [name] Carpal tunnel syndrome   . Carpal tunnel syndrome 06/09/2006   Qualifier: Diagnosis of  By: Marca Ancona RMA, Lucy    . Chronic diastolic CHF (congestive heart failure) ([place])    a. Echo (01/21/11): Vigorous LVF, EF 65-70%, no RWMA, Gr 2 DD. b.  Echo (12/15): Moderate LVH, EF 84-16%, grade 2 diastolic dysfunction, mild LAE, normal RV function c. 01/2015: echo  showing EF of 60-65% with mild LVH  . Common migraine   . COPD (chronic obstructive pulmonary disease) ([place])   . Diastolic heart failure ([place])   . Diverticulitis   . Dyspnea   . GERD (gastroesophageal reflux disease)   . Hepatic steatosis   . Hepatomegaly   . Hiatal hernia   . History of cardiomegaly   . Hyperlipidemia   . Hypertension   . IBS (irritable bowel syndrome)   . Internal hemorrhoids   . Metabolic syndrome   . Morbid obesity ([place])   . OSA (obstructive sleep apnea)    CPAP dependent  . Pneumonia   . PNEUMONIA, ORGAN UNSPECIFIED 03/01/2009   Qualifier: Diagnosis of  By: [name] CMA, [name]    . Sinusitis, chronic 03/10/2012   CT sinuses 02/2012:  Chronic sinusitis with small A/F levels >> rx by ENT with prolonged augmentin F/u ct sinuses 04/2012:  Persistent sinus thickening >> rx with levaquin and clinda for 30 days.  Told to return for sinus scan in 6 weeks >> no showed for followup visit.  CT sinuses 11/2013:  Acute and chronic sinusitis noted.    . Sleep apnea    bi pap   Past Surgical History:  Procedure Laterality Date  . ABDOMINAL HYSTERECTOMY    . BIOPSY  06/22/2018   Procedure: BIOPSY;  Surgeon: Mauri Pole, MD;  Location: [facility];  Service: Endoscopy;;  . BIOPSY  07/28/2018   Procedure: BIOPSY;  Surgeon: Mauri Pole, MD;  Location: [facility];  Service: Endoscopy;;  . CESAREAN SECTION    . COLONOSCOPY    . COLONOSCOPY WITH PROPOFOL N/A 05/05/2017   Procedure: COLONOSCOPY WITH PROPOFOL;  Surgeon: Mauri Pole, MD;  Location: [facility];  Service: Endoscopy;  Laterality: N/A;  . ESOPHAGOGASTRODUODENOSCOPY (EGD) WITH PROPOFOL N/A 06/22/2018   Procedure: ESOPHAGOGASTRODUODENOSCOPY (EGD) WITH PROPOFOL;  Surgeon: Mauri Pole, MD;  Location: [facility];  Service: Endoscopy;  Laterality: N/A;  . ESOPHAGOGASTRODUODENOSCOPY (EGD) WITH PROPOFOL N/A 07/28/2018   Procedure: ESOPHAGOGASTRODUODENOSCOPY (EGD) WITH PROPOFOL;  Surgeon:  Mauri Pole, MD;  Location: [facility];  Service: Endoscopy;  Laterality: N/A;  . NASAL SINUS SURGERY    . PARTIAL HYSTERECTOMY    . SINUS ENDO WITH FUSION Left 05/28/2019   Procedure: SINUS ENDO WITH FUSION;  Surgeon: [name], MD;  Location: [facility];  Service: ENT;  Laterality: Left;  . TONSILLECTOMY    . TUBAL LIGATION    . TURBINATE REDUCTION Bilateral 05/28/2019   Procedure: TURBINATE REDUCTION;  Surgeon: [name], MD;  Location: [facility];  Service: ENT;  Laterality: Bilateral;  . UVULOPALATOPHARYNGOPLASTY      reports that she has never smoked. She has never used smokeless tobacco. She reports that she does not drink alcohol and does not use drugs. family history includes Asthma in her sister; Breast cancer in an other family member; Colon polyps in her sister; Coronary artery disease in an other family member; Heart attack in her mother; Heart disease in her mother; Hypertension in her mother. Allergies  Allergen Reactions  . Sulfonamide Derivatives Anaphylaxis, Swelling, Rash and Other (See Comments)    Swelling tongue and ear   . Doxycycline Nausea And Vomiting  . Latex Hives  . Ciprofloxacin Rash  . Fish Allergy Rash and Other (See Comments)    Only reaction to Mackerel.  No other issues with any type of fish or shellfish currently   . Hydrocortisone Rash  . Neomycin Rash      Outpatient Encounter Medications as of 05/10/2020  Medication Sig  . acetaminophen (TYLENOL 8 HOUR) 650 MG CR tablet Take 1 tablet (650 mg total) by mouth every 8 (eight) hours as needed for pain.  . Azelastine-Fluticasone 137-50 MCG/ACT SUSP Place 137 mcg into the nose in the morning, at noon, and at bedtime.  . carvedilol (COREG) 12.5 MG tablet Take 1 tablet (12.5 mg total) by mouth 2 (two) times daily with a meal.  . cholecalciferol (VITAMIN D3) 25 MCG (1000 UNIT) tablet Take 1,000 Units by mouth daily.  [name] DEXILANT 60 MG capsule TAKE 1 CAPSULE (60 MG TOTAL) BY MOUTH EVERY MORNING.  TAKE 30 MINUTES BEFORE BREAKAST  . dicyclomine (BENTYL) 10 MG capsule Take 1 capsule (10 mg total) by mouth every 8 (eight) hours as needed for spasms.  . hydrALAZINE (APRESOLINE) 25 MG tablet Take 0.5 tablets (12.5 mg total) by mouth 3 (three) times daily.  [name] ipratropium (ATROVENT) 0.06 % nasal spray Place 3 sprays into both nostrils 3 (three) times daily.  [name] ipratropium (ATROVENT) 0.06 % nasal spray Place 2 sprays into both nostrils 3 (three) times daily.  [name] ipratropium-albuterol (DUONEB) 0.5-2.5 (3) MG/3ML SOLN Take 3 mLs by nebulization in the morning, at noon, and at bedtime.  . isosorbide dinitrate (ISORDIL) 20 MG tablet Take 1 tablet (20 mg total) by mouth 3 (three) times daily.  [name] levocetirizine (XYZAL) 5 MG tablet Take 5 mg by mouth  every evening.  [name] losartan (COZAAR) 50 MG tablet TAKE 1 TABLET BY MOUTH EVERY DAY  . lubiprostone (AMITIZA) 24 MCG capsule TAKE 1 CAPSULE BY MOUTH TWICE A DAY WITH MEALS (Patient taking differently: Take 24 mcg by mouth 2 (two) times daily with a meal.)  . meloxicam (MOBIC) 15 MG tablet Take 15 mg by mouth daily.  . methocarbamol (ROBAXIN) 500 MG tablet Take 1 tablet (500 mg total) by mouth 2 (two) times daily.  . metolazone (ZAROXOLYN) 2.5 MG tablet Take 1 tablet (2.5 mg total) by mouth daily as needed (FOR OVERNIGHT WEIGHT GAIN OF 3 lbs. or more).  . montelukast (SINGULAIR) 10 MG tablet TAKE 1 TABLET(10 MG) BY MOUTH AT BEDTIME (Patient taking differently: Take 10 mg by mouth at bedtime.)  . Potassium Chloride ER 20 MEQ TBCR TAKE 3 TABLETS BY MOUTH 3 TIMES A DAY  . Respiratory Therapy Supplies (FLUTTER) DEVI 1 application by Does not apply route as directed.  [name] spironolactone (ALDACTONE) 25 MG tablet Take 0.5 tablets (12.5 mg total) by mouth 2 (two) times daily.  [name] topiramate (TOPAMAX) 25 MG tablet Take 25 mg by mouth 2 (two) times daily as needed (for migraines).   . torsemide (DEMADEX) 20 MG tablet Take 2 tablets (40 mg total) by mouth 2 (two) times daily.   . traMADol (ULTRAM) 50 MG tablet Take 1 tablet (50 mg total) by mouth 3 (three) times daily as needed for moderate pain.  . vitamin C (ASCORBIC ACID) 250 MG tablet Take 250 mg by mouth daily.  . rosuvastatin (CRESTOR) 20 MG tablet Take 1 tablet (20 mg total) by mouth daily.  . [DISCONTINUED] albuterol (PROVENTIL) (2.5 MG/3ML) 0.083% nebulizer solution USE 1 VIAL IN NEBULIZER EVERY 4 HOURS - and as needed (Patient taking differently: Take 2.5 mg by nebulization every 4 (four) hours as needed for shortness of breath. )  . [DISCONTINUED] potassium chloride SA (K-DUR,KLOR-CON) 20 MEQ tablet TAKE 3 TABLETS (60 MEQ) BY MOUTH THREE TIMES DAILY (Patient taking differently: Take 180 mEq by mouth daily. )  . [DISCONTINUED] vitamin E 1000 UNIT capsule Take 1,000 Units by mouth daily.   No facility-administered encounter medications on file as of 05/10/2020.     REVIEW OF SYSTEMS  : All other systems reviewed and negative except where noted in the History of Present Illness.   PHYSICAL EXAM: BP 130/78 (BP Location: Left Arm, Patient Position: Sitting, Cuff Size: Normal)   Pulse 88   Ht 5' 2.75" (1.594 m)   Wt 288 lb (130.6 kg)   BMI 51.42 kg/m  General: Well developed AA female in no acute distress Head: Normocephalic and atraumatic Eyes:  Sclerae anicteric, conjunctiva pink. Ears: Normal auditory acuity Lungs: Clear throughout to auscultation; no W/R/R. Heart: Regular rate and rhythm; no M/R/G. Abdomen: Soft, obese, non-distended.  BS present.  Non-tender. Musculoskeletal: Symmetrical with no gross deformities  Skin: No lesions on visible extremities Extremities: No edema  Neurological: Alert oriented x 4, grossly non-focal Psychological:  Alert and cooperative. Normal mood and affect  ASSESSMENT AND PLAN: # Chronic GERD -Does well on Dexilant 60 mg daily but insurance will not cover it anymore.  Will try pantoprazole 40 mg daily instead.  Prescription sent to pharmacy.      # IBS-  constipation/also likely a component of medication induced constipation -Is on Amitiza 24 mcg BID and several doses of Miralax daily without much results. -Has tried Linzess 145 mcg and 290 mcg daily in the past. -We will try Trulance  3 mg daily.  Prescription sent to pharmacy.  **Follow-up in 4-6 weeks for reassessment after medication changes.  CC:  Bartholome Bill, MD

## 2020-05-10 NOTE — Patient Instructions (Addendum)
If you are age 62 or older, your body mass index should be between 23-30. Your Body mass index is 51.42 kg/m. If this is out of the aforementioned range listed, please consider follow up with your Primary Care Provider.  If you are age 76 or younger, your body mass index should be between 19-25. Your Body mass index is 51.42 kg/m. If this is out of the aformentioned range listed, please consider follow up with your Primary Care Provider.   Follow up with Tye Savoy, NP in 4-6 weeks   We have sent the following medications to your pharmacy for you to pick up at your convenience: Trulance and pantoprazole    Thank you for trusting me with your gastrointestinal care!    Alonza Bogus, PA-C

## 2020-05-11 ENCOUNTER — Telehealth: Payer: Self-pay | Admitting: Gastroenterology

## 2020-05-11 ENCOUNTER — Telehealth: Payer: Self-pay

## 2020-05-11 ENCOUNTER — Telehealth: Payer: Self-pay | Admitting: Cardiovascular Disease

## 2020-05-11 NOTE — Telephone Encounter (Signed)
Inbound call from patient. States pharmacy says medication Trulanceneed a prior authourization

## 2020-05-11 NOTE — Telephone Encounter (Signed)
Patient  Called to advise the Trulance medication is costing her $300.00 and that is not going to work seeking an alternative.

## 2020-05-11 NOTE — Telephone Encounter (Signed)
I believe that this is the one that we discussed that it ended up being approved?

## 2020-05-11 NOTE — Telephone Encounter (Signed)
Prior Authorization has been approved and letter was stamped and sent to be scanned.

## 2020-05-11 NOTE — Telephone Encounter (Signed)
Labs completed 12/01/2019-due for repeat in 3 months.  Lab orders active 12/13/2019.    Spoke to patient, she will try to come prior to appt on 5/3 but if not aware to fast AM of appt and can complete same day.

## 2020-05-11 NOTE — Telephone Encounter (Signed)
Patient call in to set up lab work but didn't see in current lab work request. Please advise

## 2020-05-12 MED ORDER — LINACLOTIDE 290 MCG PO CAPS
290.0000 ug | ORAL_CAPSULE | Freq: Every day | ORAL | 2 refills | Status: DC
Start: 2020-05-12 — End: 2020-07-20

## 2020-05-12 MED ORDER — MOTEGRITY 2 MG PO TABS: 2.0000 mg | ORAL_TABLET | Freq: Every day | ORAL | 2 refills | Status: DC

## 2020-05-12 NOTE — Telephone Encounter (Signed)
After discussing with PA Zehr. Pt will continue the Miralax 2-3 times a day  New Rx for Linzess 290 mcg daily Pt agrees with this plan

## 2020-05-12 NOTE — Telephone Encounter (Signed)
Approved for 1 year.

## 2020-05-12 NOTE — Addendum Note (Signed)
Addended by: Elias Else on: 05/12/2020 02:53 PM   Modules accepted: Orders

## 2020-05-12 NOTE — Telephone Encounter (Signed)
Patient called to request an alternative medication to the Trulance for she can not pay $300.00 for it. Please advise.

## 2020-05-15 NOTE — Progress Notes (Signed)
Reviewed and agree with documentation and assessment and plan. K. Veena Sajid Ruppert , MD   

## 2020-05-17 NOTE — Progress Notes (Signed)
Cardiology Clinic Note   Patient Name: [name] Date of Encounter: 05/23/2020  Primary Care Provider:  Bartholome Bill, MD Primary Cardiologist:  Skeet Latch, MD  Patient Profile    [name] 60 year old female presents the clinic today for follow-up evaluation of her shortness of breath.  Past Medical History    Past Medical History:  Diagnosis Date  . Acute on chronic diastolic CHF (congestive heart failure) ([name]) 04/11/2014  . Acute suppurative otitis media without spontaneous rupture of eardrum 02/04/2007   Centricity Description: OTITIS MEDIA, SUPPURATIVE, ACUTE, BILATERAL Qualifier: Diagnosis of  By: [name] MD, [name]  Centricity Description: OTITIS MEDIA, SUPPURATIVE, ACUTE Qualifier: Diagnosis of  By: [name] MD, [name]   . Allergic rhinitis   . Allergy   . Anemia   . Anxiety   . Arthritis   . CAD (coronary artery disease)    LHC (11/29/1999):  EF 60%; no significant CAD.  [name] Carpal tunnel syndrome   . Carpal tunnel syndrome 06/09/2006   Qualifier: Diagnosis of  By: Marca Ancona RMA, Lucy    . Chronic diastolic CHF (congestive heart failure) ([name])    a. Echo (01/21/11): Vigorous LVF, EF 65-70%, no RWMA, Gr 2 DD. b.  Echo (12/15): Moderate LVH, EF 67-89%, grade 2 diastolic dysfunction, mild LAE, normal RV function c. 01/2015: echo showing EF of 60-65% with mild LVH  . Common migraine   . COPD (chronic obstructive pulmonary disease) ([place])   . Diastolic heart failure ([name])   . Diverticulitis   . Dyspnea   . GERD (gastroesophageal reflux disease)   . Hepatic steatosis   . Hepatomegaly   . Hiatal hernia   . History of cardiomegaly   . Hyperlipidemia   . Hypertension   . IBS (irritable bowel syndrome)   . Internal hemorrhoids   . Metabolic syndrome   . Morbid obesity ([name])   . OSA (obstructive sleep apnea)    CPAP dependent  . Pneumonia   . PNEUMONIA, ORGAN UNSPECIFIED 03/01/2009   Qualifier: Diagnosis of  By: [name] CMA, [name]    . Sinusitis,  chronic 03/10/2012   CT sinuses 02/2012:  Chronic sinusitis with small A/F levels >> rx by ENT with prolonged augmentin F/u ct sinuses 04/2012:  Persistent sinus thickening >> rx with levaquin and clinda for 30 days.  Told to return for sinus scan in 6 weeks >> no showed for followup visit.  CT sinuses 11/2013:  Acute and chronic sinusitis noted.    . Sleep apnea    bi pap   Past Surgical History:  Procedure Laterality Date  . ABDOMINAL HYSTERECTOMY    . BIOPSY  06/22/2018   Procedure: BIOPSY;  Surgeon: Mauri Pole, MD;  Location: [facility];  Service: Endoscopy;;  . BIOPSY  07/28/2018   Procedure: BIOPSY;  Surgeon: Mauri Pole, MD;  Location: [facility];  Service: Endoscopy;;  . CESAREAN SECTION    . COLONOSCOPY    . COLONOSCOPY WITH PROPOFOL N/A 05/05/2017   Procedure: COLONOSCOPY WITH PROPOFOL;  Surgeon: Mauri Pole, MD;  Location: [facility];  Service: Endoscopy;  Laterality: N/A;  . ESOPHAGOGASTRODUODENOSCOPY (EGD) WITH PROPOFOL N/A 06/22/2018   Procedure: ESOPHAGOGASTRODUODENOSCOPY (EGD) WITH PROPOFOL;  Surgeon: Mauri Pole, MD;  Location: [facility];  Service: Endoscopy;  Laterality: N/A;  . ESOPHAGOGASTRODUODENOSCOPY (EGD) WITH PROPOFOL N/A 07/28/2018   Procedure: ESOPHAGOGASTRODUODENOSCOPY (EGD) WITH PROPOFOL;  Surgeon: Mauri Pole, MD;  Location: [facility];  Service: Endoscopy;  Laterality: N/A;  .  NASAL SINUS SURGERY    . PARTIAL HYSTERECTOMY    . SINUS ENDO WITH FUSION Left 05/28/2019   Procedure: SINUS ENDO WITH FUSION;  Surgeon: [name], MD;  Location: [facility];  Service: ENT;  Laterality: Left;  . TONSILLECTOMY    . TUBAL LIGATION    . TURBINATE REDUCTION Bilateral 05/28/2019   Procedure: TURBINATE REDUCTION;  Surgeon: [name], MD;  Location: [facility];  Service: ENT;  Laterality: Bilateral;  . UVULOPALATOPHARYNGOPLASTY      Allergies  Allergies  Allergen Reactions  . Sulfonamide Derivatives Anaphylaxis, Swelling, Rash  and Other (See Comments)    Swelling tongue and ear   . Doxycycline Nausea And Vomiting  . Latex Hives  . Ciprofloxacin Rash  . Fish Allergy Rash and Other (See Comments)    Only reaction to Mackerel.  No other issues with any type of fish or shellfish currently   . Hydrocortisone Rash  . Neomycin Rash    History of Present Illness    [name] has PMH of HTN, HLD, chronic diastolic CHF, OSA on BiPAP, morbid obesity, and COPD.  She was seen by Dr. Percival Spanish 2/18 and was doing well at that time.  In June 2018 she developed hypotension and worsening renal function.  Her diuretics were held at that time.  She developed acute shortness of breath.  Her BNP was 31 and her cardiac enzymes were negative.  She was diuresed with IV furosemide.  She followed up with nephrology and was started on torsemide 40 mg twice daily.  She reported palpitations and wore a cardiac event monitor for 14 days.  The monitor showed an average heart rate of 87 and sinus tachycardia.  She was also noted to have PACs.  Carvedilol was added to her medication regimen due to poorly controlled blood pressure.  She reported exertional dyspnea.  An echocardiogram 03/03/2017 showed an LVEF of 65-70%, G1 DD.  She underwent nuclear stress test 3/19 which showed an EF of 77% and no ischemia.  She contacted the cardiology office with complaints of lightheadedness and dizziness.  Her blood pressure at that time was 121/56.  Her hydralazine was decreased to 25 mg 3 times daily.  She was seen by Jory Sims, DNP 3/19 and her blood pressure at that time was well controlled.  She continued to report dizziness despite lack of orthostasis on exam.  She underwent a course of antibiotics and prednisone while she was in [place] for flareup of bronchitis.  On follow-up her blood pressure was noted to be low.  Her hydrochlorothiazide was reduced to 12.5 mg.  She contracted COVID-19 9/20.  She felt she had contracted the virus from family members  while she was in [place].  She was treated with home antibiotics inhalers.  She did not require hospitalization.  After a long car ride from [place] she complained of right lower extremity pain and swelling.  Her lower extremity Dopplers 2/21 were negative for DVT.  She did note shortness of breath.  A repeat echocardiogram showed an LVEF of 75% with moderate LVH.  Diastolic function was intermediate.  Her IVC was less than 1.2 cm and spontaneously collapsed suggestive of volume depletion.  She was instructed not to use metolazone and use diuretics sparingly.  She reported that when her fluid would increase she would notice fluid in her knee.  She had tried to cut back on her torsemide but did note increased swelling.  She was seen by Dr. Oval Linsey on  12/01/2019.  She reported that she had slipped on an air mattress and injured her knee.  She received a steroid injection without benefit.  She reported she had been struggling to care for her mom due to her knee injuries.  Her breathing was stable at that time.  She reported she had been falling asleep easily but had trouble staying asleep.  She reported compliance with her BiPAP.  She denied chest pain, lower extremity edema, orthopnea, and PND.  She reported that she was doing well and had not used metolazone in a month.  Orthopedics was in the process of referring her to a weight loss clinic prior to knee surgery.  She presents the clinic today for follow-up evaluation states she feels well.  She reports that she had a second opinion about her knee pain while she was in [place] visiting her daughter.  They recommended that she medically manage her knee pain.  She also reports that she contracted COVID for the second time in December and was admitted to the hospital.  She recovered well and did not require intubation.  She reports that her breathing has returned to normal.  She is now increasing her physical activity continue to monitor her diet.  She  will go back to her [place] orthopedist on June 20 for further evaluation and treatment.  I will give her the salty 6 diet sheet, have her continue to increase her physical activity, continue weight loss, and follow-up in around 4 months.  Today she denies chest pain, shortness of breath, lower extremity edema, fatigue, palpitations, melena, hematuria, hemoptysis, diaphoresis, weakness, presyncope, syncope, orthopnea, and PND.   Home Medications    Prior to Admission medications   Medication Sig Start Date End Date Taking? Authorizing Provider  acetaminophen (TYLENOL 8 HOUR) 650 MG CR tablet Take 1 tablet (650 mg total) by mouth every 8 (eight) hours as needed for pain. 12/18/19   Mannie Stabile, PA-C  Azelastine-Fluticasone 137-50 MCG/ACT SUSP Place 137 mcg into the nose in the morning, at noon, and at bedtime.    [provider]  carvedilol (COREG) 12.5 MG tablet Take 1 tablet (12.5 mg total) by mouth 2 (two) times daily with a meal. 04/19/20   Chilton Si, MD  cholecalciferol (VITAMIN D3) 25 MCG (1000 UNIT) tablet Take 1,000 Units by mouth daily.    [provider]  dicyclomine (BENTYL) 10 MG capsule Take 1 capsule (10 mg total) by mouth every 8 (eight) hours as needed for spasms. 04/24/20   Napoleon Form, MD  hydrALAZINE (APRESOLINE) 25 MG tablet Take 0.5 tablets (12.5 mg total) by mouth 3 (three) times daily. 01/28/20   Chilton Si, MD  ipratropium (ATROVENT) 0.06 % nasal spray Place 3 sprays into both nostrils 3 (three) times daily.    [provider]  ipratropium (ATROVENT) 0.06 % nasal spray Place 2 sprays into both nostrils 3 (three) times daily.    [provider]  ipratropium-albuterol (DUONEB) 0.5-2.5 (3) MG/3ML SOLN Take 3 mLs by nebulization in the morning, at noon, and at bedtime.    [provider]  isosorbide dinitrate (ISORDIL) 20 MG tablet Take 1 tablet (20 mg total) by mouth 3 (three) times daily. 04/19/20    Chilton Si, MD  levocetirizine (XYZAL) 5 MG tablet Take 5 mg by mouth every evening.    [provider]  linaclotide Karlene Einstein) 290 MCG CAPS capsule Take 1 capsule (290 mcg total) by mouth daily before breakfast. 05/12/20  Zehr, Laban Emperor, PA-C  losartan (COZAAR) 50 MG tablet TAKE 1 TABLET BY MOUTH EVERY DAY 05/01/20   Skeet Latch, MD  meloxicam (MOBIC) 15 MG tablet Take 15 mg by mouth daily. 07/19/19   [provider]  methocarbamol (ROBAXIN) 500 MG tablet Take 1 tablet (500 mg total) by mouth 2 (two) times daily. 12/18/19   Suzy Bouchard, PA-C  metolazone (ZAROXOLYN) 2.5 MG tablet Take 1 tablet (2.5 mg total) by mouth daily as needed (FOR OVERNIGHT WEIGHT GAIN OF 3 lbs. or more). 02/28/19   Skeet Latch, MD  montelukast (SINGULAIR) 10 MG tablet TAKE 1 TABLET(10 MG) BY MOUTH AT BEDTIME Patient taking differently: Take 10 mg by mouth at bedtime. 07/25/17   Tanda Rockers, MD  pantoprazole (PROTONIX) 40 MG tablet Take 1 tablet (40 mg total) by mouth daily. 05/10/20   Zehr, Laban Emperor, PA-C  Potassium Chloride ER 20 MEQ TBCR TAKE 3 TABLETS BY MOUTH 3 TIMES A DAY 12/20/19   Skeet Latch, MD  Respiratory Therapy Supplies (FLUTTER) [place] 1 application by Does not apply route as directed. 12/27/16   Tanda Rockers, MD  rosuvastatin (CRESTOR) 20 MG tablet Take 1 tablet (20 mg total) by mouth daily. 12/13/19 03/12/20  Skeet Latch, MD  spironolactone (ALDACTONE) 25 MG tablet Take 0.5 tablets (12.5 mg total) by mouth 2 (two) times daily. 03/27/20   Skeet Latch, MD  topiramate (TOPAMAX) 25 MG tablet Take 25 mg by mouth 2 (two) times daily as needed (for migraines).     [provider]  torsemide (DEMADEX) 20 MG tablet Take 2 tablets (40 mg total) by mouth 2 (two) times daily. 01/28/20   Skeet Latch, MD  traMADol (ULTRAM) 50 MG tablet Take 1 tablet (50 mg total) by mouth 3 (three) times daily as needed for moderate pain. 01/19/20   Deneise Lever, MD   vitamin C (ASCORBIC ACID) 250 MG tablet Take 250 mg by mouth daily.    [provider]  potassium chloride SA (K-DUR,KLOR-CON) 20 MEQ tablet TAKE 3 TABLETS (60 MEQ) BY MOUTH THREE TIMES DAILY Patient taking differently: Take 180 mEq by mouth daily.  04/27/18 02/24/19  Skeet Latch, MD    Family History    Family History  Problem Relation Age of Onset  . Heart disease Mother   . Heart attack Mother   . Hypertension Mother   . Coronary artery disease Other        1st degree relatvie <50  . Breast cancer Other        aunt- ? paternal or maternal  . Asthma Sister   . Colon polyps Sister   . Stroke Neg Hx   . Colon cancer Neg Hx   . Esophageal cancer Neg Hx   . Rectal cancer Neg Hx   . Stomach cancer Neg Hx    She indicated that her mother is alive. She indicated that her father is alive. She indicated that the status of her sister is unknown. She indicated that the status of her neg hx is unknown.  Social History    Social History   Socioeconomic History  . Marital status: Widowed    Spouse name: Not on file  . Number of children: 2  . Years of education: Not on file  . Highest education level: Not on file  Occupational History  . Occupation: Retired  Tobacco Use  . Smoking status: Never Smoker  . Smokeless tobacco: Never Used  Vaping Use  . Vaping Use:  Never used  Substance and Sexual Activity  . Alcohol use: No  . Drug use: No  . Sexual activity: Not on file  Other Topics Concern  . Not on file  Social History Narrative   Charity fundraiser, Oceanographer.   Widowed, lives with son.   Social Determinants of Health   Financial Resource Strain: Not on file  Food Insecurity: Not on file  Transportation Needs: Not on file  Physical Activity: Not on file  Stress: Not on file  Social Connections: Not on file  Intimate Partner Violence: Not on file     Review of Systems    General:  No chills, fever, night sweats or weight changes.   Cardiovascular:  No chest pain, dyspnea on exertion, edema, orthopnea, palpitations, paroxysmal nocturnal dyspnea. Dermatological: No rash, lesions/masses Respiratory: No cough, dyspnea Urologic: No hematuria, dysuria Abdominal:   No nausea, vomiting, diarrhea, bright red blood per rectum, melena, or hematemesis Neurologic:  No visual changes, wkns, changes in mental status. All other systems reviewed and are otherwise negative except as noted above.  Physical Exam    VS:  BP 134/76   Pulse (!) 58   Ht 5\' 3"  (1.6 m)   Wt 295 lb 12.8 oz (134.2 kg)   BMI 52.40 kg/m  , BMI Body mass index is 52.4 kg/m. GEN: Well nourished, well developed, in no acute distress. HEENT: normal. Neck: Supple, no JVD, carotid bruits, or masses. Cardiac: RRR, no murmurs, rubs, or gallops. No clubbing, cyanosis, edema.  Radials/DP/PT 2+ and equal bilaterally.  Respiratory:  Respirations regular and unlabored, clear to auscultation bilaterally. GI: Soft, nontender, nondistended, BS + x 4. MS: no deformity or atrophy. Skin: warm and dry, no rash. Neuro:  Strength and sensation are intact. Psych: Normal affect.  Accessory Clinical Findings    Recent Labs: 12/01/2019: TSH 1.860 12/18/2019: ALT 20; BUN 23; Creatinine, Ser 1.17; Hemoglobin 10.9; Platelets 261; Potassium 3.5; Sodium 140   Recent Lipid Panel    Component Value Date/Time   CHOL 238 (H) 12/01/2019 1637   TRIG 142 12/01/2019 1637   HDL 58 12/01/2019 1637   CHOLHDL 4.1 12/01/2019 1637   CHOLHDL 4.8 02/20/2015 0440   VLDL 62 (H) 02/20/2015 0440   LDLCALC 155 (H) 12/01/2019 1637    ECG personally reviewed by me today-none today.  Echocardiogram 03/23/2019 IMPRESSIONS    1. Left ventricular ejection fraction, by estimation, is >75%. The left  ventricle has hyperdynamic function. The left ventricle has no regional  wall motion abnormalities. There is moderate left ventricular hypertrophy.  Left ventricular diastolic  parameters are  indeterminate.  2. Right ventricular systolic function was not well visualized. The right  ventricular size is not well visualized.  3. The mitral valve was not well visualized. No evidence of mitral valve  regurgitation.  4. The aortic valve was not well visualized. Aortic valve regurgitation  is not visualized.  5. The inferior vena cava IVC measures <1.2 cm and spontaenously  collapses, suggesting volume depletion and a low RA pressure <3 mmHg.  Nuclear stress test 04/02/2017  Nuclear stress EF: 77%. No wall motion abnormality. Increased LV wall thickness.  The left ventricular ejection fraction is hyperdynamic (>65%).  There was no ST segment deviation noted during stress.  This is a low risk study. No ischemia identified.   Candee Furbish, MD   Assessment & Plan   1.  Chronic diastolic CHF/inappropriate sinus tachycardia- euvolemic today.  No increased DOE or activity intolerance.  Weight today  295 Continue carvedilol, hydralazine, Imdur, losartan, spironolactone, torsemide as needed Heart healthy low-sodium diet-salty 6 given Increase physical activity as tolerated Continue weight loss  Essential hypertension-BP today 134/76.  Well-controlled at home. Continue carvedilol, hydralazine, Imdur, losartan, spironolactone Heart healthy low-sodium diet-salty 6 given Increase physical activity as tolerated  Morbid obesity/exertional dyspnea- weight today 295 lbs.  Working with weight loss clinic.  Thyroid function normal. Heart healthy low-sodium diet Increase physical activity as tolerated Continue weight loss  OSA- reports CPAP compliance. Continue CPAP use  Disposition: Follow-up with Dr. Oval Linsey in 3-4 months.   Jossie Ng. Shealynn Saulnier NP-C    05/23/2020, 10:40 AM [place] [address] Office [phone] Fax [phone]  Notice: This dictation was prepared with Dragon dictation along with smaller phrase technology. Any  transcriptional errors that result from this process are unintentional and may not be corrected upon review.  I spent 14 minutes examining this patient, reviewing medications, and using patient centered shared decision making involving her cardiac care.  Prior to her visit I spent greater than 20 minutes reviewing her past medical history,  medications, and prior cardiac tests.

## 2020-05-23 ENCOUNTER — Other Ambulatory Visit: Payer: Self-pay

## 2020-05-23 ENCOUNTER — Encounter: Payer: Self-pay | Admitting: General Practice

## 2020-05-23 ENCOUNTER — Ambulatory Visit (INDEPENDENT_AMBULATORY_CARE_PROVIDER_SITE_OTHER): Payer: 59 | Admitting: General Practice

## 2020-05-23 VITALS — BP 134/76 | HR 58 | Ht 63.0 in | Wt 295.8 lb

## 2020-05-23 DIAGNOSIS — G4733 Obstructive sleep apnea (adult) (pediatric): Secondary | ICD-10-CM

## 2020-05-23 DIAGNOSIS — I5032 Chronic diastolic (congestive) heart failure: Secondary | ICD-10-CM

## 2020-05-23 DIAGNOSIS — I10 Essential (primary) hypertension: Secondary | ICD-10-CM | POA: Diagnosis not present

## 2020-05-23 DIAGNOSIS — R0600 Dyspnea, unspecified: Secondary | ICD-10-CM

## 2020-05-23 DIAGNOSIS — R0609 Other forms of dyspnea: Secondary | ICD-10-CM

## 2020-05-23 LAB — COMPREHENSIVE METABOLIC PANEL
ALT: 21 IU/L (ref 0–32)
AST: 20 IU/L (ref 0–40)
Albumin/Globulin Ratio: 1.4 (ref 1.2–2.2)
Albumin: 4 g/dL (ref 3.8–4.8)
Alkaline Phosphatase: 95 IU/L (ref 44–121)
BUN/Creatinine Ratio: 21 (ref 12–28)
BUN: 23 mg/dL (ref 8–27)
Bilirubin Total: 0.4 mg/dL (ref 0.0–1.2)
CO2: 24 mmol/L (ref 20–29)
Calcium: 9.5 mg/dL (ref 8.7–10.3)
Chloride: 102 mmol/L (ref 96–106)
Creatinine, Ser: 1.07 mg/dL — ABNORMAL HIGH (ref 0.57–1.00)
Globulin, Total: 2.8 g/dL (ref 1.5–4.5)
Glucose: 105 mg/dL — ABNORMAL HIGH (ref 65–99)
Potassium: 3.8 mmol/L (ref 3.5–5.2)
Sodium: 142 mmol/L (ref 134–144)
Total Protein: 6.8 g/dL (ref 6.0–8.5)
eGFR: 59 mL/min/{1.73_m2} — ABNORMAL LOW (ref >59)

## 2020-05-23 LAB — LIPID PANEL
Chol/HDL Ratio: 2.4 ratio (ref 0.0–4.4)
Cholesterol, Total: 120 mg/dL (ref 100–199)
HDL: 49 mg/dL (ref >39)
LDL Chol Calc (NIH): 54 mg/dL (ref 0–99)
Triglycerides: 89 mg/dL (ref 0–149)
VLDL Cholesterol Cal: 17 mg/dL (ref 5–40)

## 2020-05-23 NOTE — Patient Instructions (Signed)
Medication Instructions:  The current medical regimen is effective;  continue present plan and medications as directed. Please refer to the Current Medication list given to you today.; *If you need a refill on your cardiac medications before your next appointment, please call your pharmacy*  Lab Work:   Testing/Procedures:  NONE    NONE  Special Instructions PLEASE READ AND FOLLOW SALTY 6-ATTACHED-1,800mg  daily  PLEASE INCREASE PHYSICAL ACTIVITY AS TOLERATED  Follow-Up: Your next appointment:  3-4 month(s) In Person with Skeet Latch, MD OR IF UNAVAILABLE [place], FNP-C  At [facility], you and your health needs are our priority.  As part of our continuing mission to provide you with exceptional heart care, we have created designated Provider Care Teams.  These Care Teams include your primary Cardiologist (physician) and Advanced Practice Providers (APPs -  Physician Assistants and Nurse Practitioners) who all work together to provide you with the care you need, when you need it.            6 SALTY THINGS TO AVOID     1,800MG  DAILY

## 2020-06-03 ENCOUNTER — Other Ambulatory Visit: Payer: Self-pay | Admitting: Internal Medicine

## 2020-06-07 ENCOUNTER — Other Ambulatory Visit: Payer: Self-pay

## 2020-06-07 ENCOUNTER — Emergency Department (HOSPITAL_COMMUNITY): Payer: 59

## 2020-06-07 ENCOUNTER — Emergency Department (HOSPITAL_COMMUNITY)
Admission: EM | Admit: 2020-06-07 | Discharge: 2020-06-07 | Disposition: A | Discharge status: Home / Self Care | Payer: 59 | Attending: Emergency Medicine | Admitting: Emergency Medicine

## 2020-06-07 ENCOUNTER — Encounter (HOSPITAL_COMMUNITY): Payer: Self-pay

## 2020-06-07 DIAGNOSIS — R059 Cough, unspecified: Secondary | ICD-10-CM | POA: Insufficient documentation

## 2020-06-07 DIAGNOSIS — I130 Hypertensive heart and chronic kidney disease with heart failure and stage 1 through stage 4 chronic kidney disease, or unspecified chronic kidney disease: Secondary | ICD-10-CM | POA: Diagnosis not present

## 2020-06-07 DIAGNOSIS — Z91040 Latex allergy status: Secondary | ICD-10-CM | POA: Diagnosis not present

## 2020-06-07 DIAGNOSIS — N183 Chronic kidney disease, stage 3 unspecified: Secondary | ICD-10-CM | POA: Diagnosis not present

## 2020-06-07 DIAGNOSIS — Z79899 Other long term (current) drug therapy: Secondary | ICD-10-CM | POA: Insufficient documentation

## 2020-06-07 DIAGNOSIS — Z7951 Long term (current) use of inhaled steroids: Secondary | ICD-10-CM | POA: Insufficient documentation

## 2020-06-07 DIAGNOSIS — J449 Chronic obstructive pulmonary disease, unspecified: Secondary | ICD-10-CM | POA: Diagnosis not present

## 2020-06-07 DIAGNOSIS — I5033 Acute on chronic diastolic (congestive) heart failure: Secondary | ICD-10-CM | POA: Insufficient documentation

## 2020-06-07 DIAGNOSIS — I2510 Atherosclerotic heart disease of native coronary artery without angina pectoris: Secondary | ICD-10-CM | POA: Diagnosis not present

## 2020-06-07 DIAGNOSIS — R0602 Shortness of breath: Secondary | ICD-10-CM

## 2020-06-07 LAB — COMPREHENSIVE METABOLIC PANEL
ALT: 32 U/L (ref 0–44)
AST: 25 U/L (ref 15–41)
Albumin: 3.3 g/dL — ABNORMAL LOW (ref 3.5–5.0)
Alkaline Phosphatase: 89 U/L (ref 38–126)
Anion gap: 12 (ref 5–15)
BUN: 23 mg/dL (ref 8–23)
CO2: 25 mmol/L (ref 22–32)
Calcium: 9.3 mg/dL (ref 8.9–10.3)
Chloride: 106 mmol/L (ref 98–111)
Creatinine, Ser: 1.11 mg/dL — ABNORMAL HIGH (ref 0.44–1.00)
GFR, Estimated: 57 mL/min — ABNORMAL LOW (ref >60)
Glucose, Bld: 105 mg/dL — ABNORMAL HIGH (ref 70–99)
Potassium: 3.7 mmol/L (ref 3.5–5.1)
Sodium: 143 mmol/L (ref 135–145)
Total Bilirubin: 0.4 mg/dL (ref 0.3–1.2)
Total Protein: 7.5 g/dL (ref 6.5–8.1)

## 2020-06-07 LAB — CBC WITH DIFFERENTIAL/PLATELET
Abs Immature Granulocytes: 0.1 10*3/uL — ABNORMAL HIGH (ref 0.00–0.07)
Basophils Absolute: 0.1 10*3/uL (ref 0.0–0.1)
Basophils Relative: 0 %
Eosinophils Absolute: 0.5 10*3/uL (ref 0.0–0.5)
Eosinophils Relative: 4 %
HCT: 35 % — ABNORMAL LOW (ref 36.0–46.0)
Hemoglobin: 11 g/dL — ABNORMAL LOW (ref 12.0–15.0)
Immature Granulocytes: 1 %
Lymphocytes Relative: 26 %
Lymphs Abs: 2.9 10*3/uL (ref 0.7–4.0)
MCH: 31 pg (ref 26.0–34.0)
MCHC: 31.4 g/dL (ref 30.0–36.0)
MCV: 98.6 fL (ref 80.0–100.0)
Monocytes Absolute: 0.7 10*3/uL (ref 0.1–1.0)
Monocytes Relative: 6 %
Neutro Abs: 7 10*3/uL (ref 1.7–7.7)
Neutrophils Relative %: 63 %
Platelets: 230 10*3/uL (ref 150–400)
RBC: 3.55 MIL/uL — ABNORMAL LOW (ref 3.87–5.11)
RDW: 13.3 % (ref 11.5–15.5)
WBC: 11.2 10*3/uL — ABNORMAL HIGH (ref 4.0–10.5)
nRBC: 0 % (ref 0.0–0.2)

## 2020-06-07 LAB — BRAIN NATRIURETIC PEPTIDE: B Natriuretic Peptide: 34.4 pg/mL (ref 0.0–100.0)

## 2020-06-07 LAB — TROPONIN I (HIGH SENSITIVITY): Troponin I (High Sensitivity): 2 ng/L (ref <18)

## 2020-06-07 MED ORDER — ALBUTEROL SULFATE HFA 108 (90 BASE) MCG/ACT IN AERS: 2.0000 | INHALATION_SPRAY | RESPIRATORY_TRACT | Status: DC | PRN

## 2020-06-07 MED ORDER — IPRATROPIUM-ALBUTEROL 0.5-2.5 (3) MG/3ML IN SOLN
3.0000 mL | Freq: Once | RESPIRATORY_TRACT | Status: AC
  Administered 2020-06-07: 3 mL via RESPIRATORY_TRACT
  Filled 2020-06-07: qty 3

## 2020-06-07 MED ORDER — PREDNISONE 20 MG PO TABS: 40.0000 mg | ORAL_TABLET | Freq: Every day | ORAL | 0 refills | Status: DC

## 2020-06-07 MED ORDER — PREDNISONE 20 MG PO TABS
40.0000 mg | ORAL_TABLET | Freq: Once | ORAL | Status: AC
  Administered 2020-06-07: 40 mg via ORAL
  Filled 2020-06-07: qty 2

## 2020-06-07 NOTE — ED Provider Notes (Signed)
[facility] DEPT Provider Note   CSN: [id number] Arrival date & time: 06/07/20  1159     History Chief Complaint  Patient presents with  . Shortness of Breath  . Fatigue    [name] is a 60 y.o. female.  60yo F w/ extensive PMH below including CHF, CAD, COPD, GERD, CKD, OSA, morbid obesity who p/w cough and SOB. Pt reports she caught COVID in Dec 2021 in [place] while visiting family and as a result, she's had chronic bronchitis and chronic sinusitis. She followed w/ a pulmonologist in [place] and has been on inhalers since then. For the past 4 days, she's been feeling sluggish, has had increased SOB especially w/ exertion, has been having left shoulder pain and can barely keep her eyes open because she's so tired. Cough is productive, no associated fevers, chest pain, or LE edema. Her weight fluctuates. No recent steroid or antibiotic use. She reports orthopnea and PND.   The history is provided by the patient.  Shortness of Breath      Past Medical History:  Diagnosis Date  . Acute on chronic diastolic CHF (congestive heart failure) ([place]) 04/11/2014  . Acute suppurative otitis media without spontaneous rupture of eardrum 02/04/2007   Centricity Description: OTITIS MEDIA, SUPPURATIVE, ACUTE, BILATERAL Qualifier: Diagnosis of  By: [name] MD, [name]  Centricity Description: OTITIS MEDIA, SUPPURATIVE, ACUTE Qualifier: Diagnosis of  By: [name] MD, [name]   . Allergic rhinitis   . Allergy   . Anemia   . Anxiety   . Arthritis   . CAD (coronary artery disease)    LHC (11/29/1999):  EF 60%; no significant CAD.  [name] Carpal tunnel syndrome   . Carpal tunnel syndrome 06/09/2006   Qualifier: Diagnosis of  By: Marca Ancona RMA, Lucy    . Chronic diastolic CHF (congestive heart failure) ([place])    a. Echo (01/21/11): Vigorous LVF, EF 65-70%, no RWMA, Gr 2 DD. b.  Echo (12/15): Moderate LVH, EF 42-35%, grade 2 diastolic dysfunction, mild LAE, normal RV function c.  01/2015: echo showing EF of 60-65% with mild LVH  . Common migraine   . COPD (chronic obstructive pulmonary disease) ([place])   . Diastolic heart failure ([place])   . Diverticulitis   . Dyspnea   . GERD (gastroesophageal reflux disease)   . Hepatic steatosis   . Hepatomegaly   . Hiatal hernia   . History of cardiomegaly   . Hyperlipidemia   . Hypertension   . IBS (irritable bowel syndrome)   . Internal hemorrhoids   . Metabolic syndrome   . Morbid obesity ([place])   . OSA (obstructive sleep apnea)    CPAP dependent  . Pneumonia   . PNEUMONIA, ORGAN UNSPECIFIED 03/01/2009   Qualifier: Diagnosis of  By: [name] CMA, [name]    . Sinusitis, chronic 03/10/2012   CT sinuses 02/2012:  Chronic sinusitis with small A/F levels >> rx by ENT with prolonged augmentin F/u ct sinuses 04/2012:  Persistent sinus thickening >> rx with levaquin and clinda for 30 days.  Told to return for sinus scan in 6 weeks >> no showed for followup visit.  CT sinuses 11/2013:  Acute and chronic sinusitis noted.    . Sleep apnea    bi pap    Patient Active Problem List   Diagnosis Date Noted  . Constipation 05/10/2020  . Gastroesophageal reflux disease 05/10/2020  . CAP (community acquired pneumonia) 11/11/2018  . Abdominal pain, chronic, epigastric   . Abdominal  pain   . Gastritis and gastroduodenitis   . Bronchitis 02/12/2018  . Polyp of sigmoid colon   . CKD (chronic kidney disease) stage 3, GFR 30-59 ml/min (HCC) 07/08/2016  . Cough variant asthma 07/31/2015  . Upper airway cough syndrome 04/11/2015  . Bronchitis, chronic obstructive w acute bronchitis ([place]) 04/11/2015  . Dyspnea   . Chronic diastolic heart failure ([place]) 03/27/2015  . Morbid obesity due to excess calories ([place]) 03/27/2015  . Dysphagia 03/27/2015  . Hyponatremia 03/27/2015  . Acute kidney injury ([place]) 03/27/2015  . Hypokalemia 03/27/2015  . Leukocytosis 03/27/2015  . COPD exacerbation ([place]) 03/27/2015  . Other irritable bowel syndrome  04/13/2014  . Sinusitis, chronic 03/10/2012  . Hyperlipidemia 10/02/2006  . Obstructive sleep apnea 06/09/2006  . Essential hypertension 06/09/2006    Past Surgical History:  Procedure Laterality Date  . ABDOMINAL HYSTERECTOMY    . BIOPSY  06/22/2018   Procedure: BIOPSY;  Surgeon: Mauri Pole, MD;  Location: [facility];  Service: Endoscopy;;  . BIOPSY  07/28/2018   Procedure: BIOPSY;  Surgeon: Mauri Pole, MD;  Location: [facility];  Service: Endoscopy;;  . CESAREAN SECTION    . COLONOSCOPY    . COLONOSCOPY WITH PROPOFOL N/A 05/05/2017   Procedure: COLONOSCOPY WITH PROPOFOL;  Surgeon: Mauri Pole, MD;  Location: [facility];  Service: Endoscopy;  Laterality: N/A;  . ESOPHAGOGASTRODUODENOSCOPY (EGD) WITH PROPOFOL N/A 06/22/2018   Procedure: ESOPHAGOGASTRODUODENOSCOPY (EGD) WITH PROPOFOL;  Surgeon: Mauri Pole, MD;  Location: [facility];  Service: Endoscopy;  Laterality: N/A;  . ESOPHAGOGASTRODUODENOSCOPY (EGD) WITH PROPOFOL N/A 07/28/2018   Procedure: ESOPHAGOGASTRODUODENOSCOPY (EGD) WITH PROPOFOL;  Surgeon: Mauri Pole, MD;  Location: [facility];  Service: Endoscopy;  Laterality: N/A;  . NASAL SINUS SURGERY    . PARTIAL HYSTERECTOMY    . SINUS ENDO WITH FUSION Left 05/28/2019   Procedure: SINUS ENDO WITH FUSION;  Surgeon: [name], MD;  Location: [facility];  Service: ENT;  Laterality: Left;  . TONSILLECTOMY    . TUBAL LIGATION    . TURBINATE REDUCTION Bilateral 05/28/2019   Procedure: TURBINATE REDUCTION;  Surgeon: [name], MD;  Location: [facility];  Service: ENT;  Laterality: Bilateral;  . UVULOPALATOPHARYNGOPLASTY       OB History   No obstetric history on file.     Family History  Problem Relation Age of Onset  . Heart disease Mother   . Heart attack Mother   . Hypertension Mother   . Coronary artery disease Other        1st degree relatvie <50  . Breast cancer Other        aunt- ? paternal or maternal  . Asthma Sister    . Colon polyps Sister   . Stroke Neg Hx   . Colon cancer Neg Hx   . Esophageal cancer Neg Hx   . Rectal cancer Neg Hx   . Stomach cancer Neg Hx     Social History   Tobacco Use  . Smoking status: Never Smoker  . Smokeless tobacco: Never Used  Vaping Use  . Vaping Use: Never used  Substance Use Topics  . Alcohol use: No  . Drug use: No    Home Medications Prior to Admission medications   Medication Sig Start Date End Date Taking? Authorizing Provider  predniSONE (DELTASONE) 20 MG tablet Take 2 tablets (40 mg total) by mouth daily. 06/07/20  Yes Merrill Villarruel, Wenda Overland, MD  acetaminophen (TYLENOL 8 HOUR) 650 MG CR tablet Take 1  tablet (650 mg total) by mouth every 8 (eight) hours as needed for pain. 12/18/19   Suzy Bouchard, PA-C  Azelastine-Fluticasone 137-50 MCG/ACT SUSP Place 137 mcg into the nose in the morning, at noon, and at bedtime.    [provider]  carvedilol (COREG) 12.5 MG tablet Take 1 tablet (12.5 mg total) by mouth 2 (two) times daily with a meal. 04/19/20   Skeet Latch, MD  cholecalciferol (VITAMIN D3) 25 MCG (1000 UNIT) tablet Take 1,000 Units by mouth daily.    [provider]  cloNIDine (CATAPRES) 0.3 MG tablet Take by mouth.    [provider]  dicyclomine (BENTYL) 10 MG capsule Take 1 capsule (10 mg total) by mouth every 8 (eight) hours as needed for spasms. 04/24/20   Mauri Pole, MD  EPINEPHrine 0.3 mg/0.3 mL IJ SOAJ injection  12/19/19   [provider]  ergocalciferol (VITAMIN D2) 1.25 MG (50000 UT) capsule Take 50,000 Units by mouth once a week. 05/18/20   [provider]  fluticasone (FLONASE) 50 MCG/ACT nasal spray Place into the nose. 03/03/20   [provider]  hydrALAZINE (APRESOLINE) 25 MG tablet Take 0.5 tablets (12.5 mg total) by mouth 3 (three) times daily. 01/28/20   Skeet Latch, MD  ipratropium (ATROVENT) 0.06 % nasal spray Place 2 sprays into both nostrils 3 (three) times  daily.    [provider]  ipratropium-albuterol (DUONEB) 0.5-2.5 (3) MG/3ML SOLN Take 3 mLs by nebulization in the morning, at noon, and at bedtime.    [provider]  isosorbide dinitrate (ISORDIL) 20 MG tablet Take 1 tablet (20 mg total) by mouth 3 (three) times daily. 04/19/20   Skeet Latch, MD  levocetirizine (XYZAL) 5 MG tablet Take 5 mg by mouth every evening.    [provider]  linaclotide Rolan Lipa) 290 MCG CAPS capsule Take 1 capsule (290 mcg total) by mouth daily before breakfast. 05/12/20   Zehr, Janett Billow D, PA-C  losartan (COZAAR) 50 MG tablet TAKE 1 TABLET BY MOUTH EVERY DAY 05/01/20   Skeet Latch, MD  meclizine (ANTIVERT) 25 MG tablet Take by mouth as needed.    [provider]  meloxicam (MOBIC) 15 MG tablet Take 15 mg by mouth daily. 07/19/19   [provider]  methocarbamol (ROBAXIN) 500 MG tablet Take 1 tablet (500 mg total) by mouth 2 (two) times daily. 12/18/19   Suzy Bouchard, PA-C  metolazone (ZAROXOLYN) 2.5 MG tablet Take 1 tablet (2.5 mg total) by mouth daily as needed (FOR OVERNIGHT WEIGHT GAIN OF 3 lbs. or more). 02/28/19   Skeet Latch, MD  montelukast (SINGULAIR) 10 MG tablet TAKE 1 TABLET(10 MG) BY MOUTH AT BEDTIME Patient taking differently: Take 10 mg by mouth at bedtime. 07/25/17   Tanda Rockers, MD  pantoprazole (PROTONIX) 40 MG tablet Take 1 tablet (40 mg total) by mouth daily. 05/10/20   Zehr, Laban Emperor, PA-C  Potassium Chloride ER 20 MEQ TBCR TAKE 3 TABLETS BY MOUTH 3 TIMES A DAY 12/20/19   Skeet Latch, MD  Respiratory Therapy Supplies (FLUTTER) [place] 1 application by Does not apply route as directed. 12/27/16   Tanda Rockers, MD  rosuvastatin (CRESTOR) 20 MG tablet Take 1 tablet (20 mg total) by mouth daily. 12/13/19 03/12/20  Skeet Latch, MD  spironolactone (ALDACTONE) 25 MG tablet Take 0.5 tablets (12.5 mg total) by mouth 2 (two) times daily. 03/27/20   Skeet Latch, MD  tizanidine  (ZANAFLEX) 2 MG capsule Take by mouth. 12/28/19  [provider]  topiramate (TOPAMAX) 25 MG tablet Take 25 mg by mouth 2 (two) times daily as needed (for migraines).     [provider]  torsemide (DEMADEX) 20 MG tablet Take 2 tablets (40 mg total) by mouth 2 (two) times daily. 01/28/20   Skeet Latch, MD  traMADol (ULTRAM) 50 MG tablet Take 1 tablet (50 mg total) by mouth 3 (three) times daily as needed for moderate pain. 01/19/20   Deneise Lever, MD  TRELEGY ELLIPTA 200-62.5-25 MCG/INH AEPB Inhale 1 puff into the lungs daily. 05/23/20   [provider]  vitamin C (ASCORBIC ACID) 250 MG tablet Take 250 mg by mouth daily.    [provider]    Allergies    Sulfonamide derivatives, Doxycycline, Latex, Ciprofloxacin, Fish allergy, Hydrocortisone, and Neomycin  Review of Systems   Review of Systems  Respiratory: Positive for shortness of breath.    All other systems reviewed and are negative except that which was mentioned in HPI  Physical Exam Updated Vital Signs BP 132/78   Pulse 77   Temp 97.8 F (36.6 C) (Oral)   Resp 16   SpO2 98%   Physical Exam Vitals and nursing note reviewed.  Constitutional:      General: She is not in acute distress.    Appearance: Normal appearance. She is obese.  HENT:     Head: Normocephalic and atraumatic.  Eyes:     Conjunctiva/sclera: Conjunctivae normal.  Cardiovascular:     Rate and Rhythm: Regular rhythm. Bradycardia present.     Heart sounds: Normal heart sounds. No murmur heard.   Pulmonary:     Effort: Pulmonary effort is normal.     Comments: Diminished BS b/l but partly limited by body habitus, no obvious wheezing, normal WOB Abdominal:     General: Abdomen is flat. Bowel sounds are normal. There is no distension.     Palpations: Abdomen is soft.     Tenderness: There is no abdominal tenderness.  Musculoskeletal:     Right lower leg: No edema.     Left lower leg: No edema.  Skin:     General: Skin is warm and dry.  Neurological:     Mental Status: She is alert and oriented to person, place, and time.     Comments: fluent  Psychiatric:        Mood and Affect: Mood normal.        Behavior: Behavior normal.     ED Results / Procedures / Treatments   Labs (all labs ordered are listed, but only abnormal results are displayed) Labs Reviewed  COMPREHENSIVE METABOLIC PANEL - Abnormal; Notable for the following components:      Result Value   Glucose, Bld 105 (*)    Creatinine, Ser 1.11 (*)    Albumin 3.3 (*)    GFR, Estimated 57 (*)    All other components within normal limits  CBC WITH DIFFERENTIAL/PLATELET - Abnormal; Notable for the following components:   WBC 11.2 (*)    RBC 3.55 (*)    Hemoglobin 11.0 (*)    HCT 35.0 (*)    Abs Immature Granulocytes 0.10 (*)    All other components within normal limits  BRAIN NATRIURETIC PEPTIDE  TROPONIN I (HIGH SENSITIVITY)    EKG EKG Interpretation  Date/Time:  Wednesday Jun 07 2020 12:16:29 EDT Ventricular Rate:  62 PR Interval:  187 QRS Duration: 95 QT Interval:  399 QTC Calculation: 406 R Axis:   66 Text  Interpretation: Sinus rhythm Low voltage, extremity and precordial leads 12 Lead; Mason-Likar No significant change since last tracing Confirmed by Dorie Rank [phone]) on 06/07/2020 12:33:32 PM   Radiology No results found.  Procedures Procedures   Medications Ordered in ED Medications  ipratropium-albuterol (DUONEB) 0.5-2.5 (3) MG/3ML nebulizer solution 3 mL (3 mLs Nebulization Given 06/07/20 1404)  predniSONE (DELTASONE) tablet 40 mg (40 mg Oral Given 06/07/20 1716)    ED Course  I have reviewed the triage vital signs and the nursing notes.  Pertinent labs & imaging results that were available during my care of the patient were reviewed by me and considered in my medical decision making (see chart for details).    MDM Rules/Calculators/A&P                          Alert, NAD on exam, stable VS on  RA. She had frequent cough once we started conversation but periods of time where she was breathing comfortably. Diminished b/l but exam limited 2/2 body habitus. Gave duoneb given hx. W/u shows negative BNP and trop, clear CXR. W/u reassuring against CHF exacerbation or pneumonia. Given pattern of sx since having COVID, she may have "long COVID" vs ongoing problems w/ obstructive lung disease. I strongly recommended she see pulmonology here and offered another steroid burst to see if it helps sx as pt reported improvement in breathing after breathing treatments. Discussed continuing breathing treatments and all other medications at home. Reviewed return precautions.  Final Clinical Impression(s) / ED Diagnoses Final diagnoses:  Shortness of breath    Rx / DC Orders ED Discharge Orders         Ordered    predniSONE (DELTASONE) 20 MG tablet  Daily        06/07/20 1658           Shloka Baldridge, Wenda Overland, MD 06/13/20 1512

## 2020-06-07 NOTE — ED Triage Notes (Addendum)
Patient arrived POV.   Patient reports she has chronic bronchitis and chronic sinus issues.  Patient c/o shob with exertion, fatigue, left shoulder pain X4 days.  Patient reports productive cough.  Patient reports taking a nebulizer treatment 3x a day and using inhaler Patient reports a negative covid test mothers day weekend.   Patient ambulatory in triage.   A/Ox4

## 2020-06-07 NOTE — ED Notes (Signed)
Pt to xray. Pt belongings placed in room 14

## 2020-06-07 NOTE — ED Notes (Signed)
Patient attached to external female catheter 

## 2020-06-12 ENCOUNTER — Encounter: Payer: Self-pay | Admitting: Pulmonary Disease

## 2020-06-12 ENCOUNTER — Ambulatory Visit (INDEPENDENT_AMBULATORY_CARE_PROVIDER_SITE_OTHER): Payer: 59 | Admitting: Pulmonary Disease

## 2020-06-12 ENCOUNTER — Other Ambulatory Visit: Payer: Self-pay

## 2020-06-12 VITALS — BP 118/64 | HR 78 | Temp 97.2°F | Ht 63.0 in | Wt 285.0 lb

## 2020-06-12 DIAGNOSIS — G4733 Obstructive sleep apnea (adult) (pediatric): Secondary | ICD-10-CM

## 2020-06-12 NOTE — Patient Instructions (Signed)
Will have [facility] change your Bipap setting to 13/9 cm water pressure  Follow up in 6 months

## 2020-06-12 NOTE — Progress Notes (Signed)
[facility]  Chief Complaint  Patient presents with  . Follow-up    Wears bipap-makes noise when breathing Wakes up a lot at night    Constitutional:  BP 118/64 (BP Location: Left Arm, Cuff Size: Large)   Pulse 78   Temp (!) 97.2 F (36.2 C) (Temporal)   Ht 5\' 3"  (1.6 m)   Wt 285 lb (129.3 kg)   SpO2 99%   BMI 50.49 kg/m   Past Medical History:  Diastolic CHF, Allergies, Anemia, Anxiety, CAD, Carpal tunnel syndrome, Migraine headaches, COPD, Diverticulitis, GERD, Fatty liver, Hiatal hernia, HLD, HTN, IBS, COVID 19 in December 2021  Past Surgical History:  She  has a past surgical history that includes Tubal ligation; Tonsillectomy; Uvulopalatopharyngoplasty; Cesarean section; Nasal sinus surgery; Partial hysterectomy; Abdominal hysterectomy; Colonoscopy; Colonoscopy with propofol (N/A, 05/05/2017); Esophagogastroduodenoscopy (egd) with propofol (N/A, 06/22/2018); biopsy (06/22/2018); Esophagogastroduodenoscopy (egd) with propofol (N/A, 07/28/2018); biopsy (07/28/2018); Turbinate reduction (Bilateral, 05/28/2019); and Sinus endo with fusion (Left, 05/28/2019).  Brief Summary:  [name] is a 60 y.o. female with dyspnea, chronic bronchitis, and obstructive sleep apnea.      Subjective:   I last saw her in 2019.  More recently has seen Dr. [name] and Eric Form.  She was visiting family in [place] in December 2021.  She contracted COVID and required hospitalization for 7 days then. She was seen by pulmonary Dr. Shary Key with [facility], [address] [place] [address] (phone [phone], [phone]).  She was started on trelegy and this has helped.  She would like to have Dr. Lenard Galloway and Dr. [name] manage her respiratory issues and have me focus on her sleep apnea.    She has Bipap.  She uses nightly.  She doesn't feel like she gets deep sleep.  Her mask leaks, and she feels the pressure is too  high.   Physical Exam:   Appearance - well kempt   ENMT - no sinus tenderness, no oral exudate, no LAN, Mallampati 3 airway, no stridor  Respiratory - equal breath sounds bilaterally, no wheezing or rales  CV - s1s2 regular rate and rhythm, no murmurs  Ext - no clubbing, no edema  Skin - no rashes  Psych - normal mood and affect   Sleep Tests:   Bipap 05/10/20 to 06/08/20 >> used on 30 of 30 nights with average 7 hrs 18 min.  Average AHI 1.6 with Bipap 15/11 cm H2O. Significant air leak.  Cardiac Tests:   Echo 03/03/17 >> EF 65 to 70%, grade 1 DD  Social History:  She  reports that she has never smoked. She has never used smokeless tobacco. She reports that she does not drink alcohol and does not use drugs.  Family History:  Her family history includes Asthma in her sister; Breast cancer in an other family member; Colon polyps in her sister; Coronary artery disease in an other family member; Heart attack in her mother; Heart disease in her mother; Hypertension in her mother.     Assessment/Plan:   Obstructive sleep apnea. - she is compliant with Bipap and reports benefit from therapy - she uses [facility] for her DME - having mask leak - will have her Bipap changed to 13/9 cm H2O; she will call if she is still having trouble after this pressure change  COPD, history of COVID 19 infection. - she will be followed by Dr. Christinia Gully in [place] - she will be  followed by Dr. Shary Key with [facility], [address] [place] [address] (phone [phone], faxe [phone])  Time Spent Involved in Patient Care on Day of Examination:  32 minutes  Follow up:  Patient Instructions  Will have [facility] change your Bipap setting to 13/9 cm water pressure  Follow up in 6 months    Medication List:   Allergies as of 06/12/2020      Reactions   Sulfonamide Derivatives Anaphylaxis, Swelling, Rash, Other (See Comments)   Swelling tongue and ear     Doxycycline Nausea And Vomiting   Latex Hives   Ciprofloxacin Rash   Fish Allergy Rash, Other (See Comments)   Only reaction to Mackerel.  No other issues with any type of fish or shellfish currently    Hydrocortisone Rash   Neomycin Rash      Medication List       Accurate as of Jun 12, 2020 12:55 PM. If you have any questions, ask your nurse or doctor.        acetaminophen 650 MG CR tablet Commonly known as: Tylenol 8 Hour Take 1 tablet (650 mg total) by mouth every 8 (eight) hours as needed for pain.   Azelastine-Fluticasone 137-50 MCG/ACT Susp Place 137 mcg into the nose in the morning, at noon, and at bedtime.   carvedilol 12.5 MG tablet Commonly known as: COREG Take 1 tablet (12.5 mg total) by mouth 2 (two) times daily with a meal.   cholecalciferol 25 MCG (1000 UNIT) tablet Commonly known as: VITAMIN D3 Take 1,000 Units by mouth daily.   cloNIDine 0.3 MG tablet Commonly known as: CATAPRES Take by mouth.   dicyclomine 10 MG capsule Commonly known as: BENTYL Take 1 capsule (10 mg total) by mouth every 8 (eight) hours as needed for spasms.   EPINEPHrine 0.3 mg/0.3 mL Soaj injection Commonly known as: EPI-PEN   ergocalciferol 1.25 MG (50000 UT) capsule Commonly known as: VITAMIN D2 Take 50,000 Units by mouth once a week.   fluticasone 50 MCG/ACT nasal spray Commonly known as: FLONASE Place into the nose.   Flutter Devi 1 application by Does not apply route as directed.   hydrALAZINE 25 MG tablet Commonly known as: APRESOLINE Take 0.5 tablets (12.5 mg total) by mouth 3 (three) times daily.   ipratropium 0.06 % nasal spray Commonly known as: ATROVENT Place 2 sprays into both nostrils 3 (three) times daily.   ipratropium-albuterol 0.5-2.5 (3) MG/3ML Soln Commonly known as: DUONEB Take 3 mLs by nebulization in the morning, at noon, and at bedtime.   isosorbide dinitrate 20 MG tablet Commonly known as: ISORDIL Take 1 tablet (20 mg total) by mouth 3  (three) times daily.   levocetirizine 5 MG tablet Commonly known as: XYZAL Take 5 mg by mouth every evening.   linaclotide 290 MCG Caps capsule Commonly known as: Linzess Take 1 capsule (290 mcg total) by mouth daily before breakfast.   losartan 50 MG tablet Commonly known as: COZAAR TAKE 1 TABLET BY MOUTH EVERY DAY   meclizine 25 MG tablet Commonly known as: ANTIVERT Take by mouth as needed.   meloxicam 15 MG tablet Commonly known as: MOBIC Take 15 mg by mouth daily.   methocarbamol 500 MG tablet Commonly known as: ROBAXIN Take 1 tablet (500 mg total) by mouth 2 (two) times daily.   metolazone 2.5 MG tablet Commonly known as: ZAROXOLYN Take 1 tablet (2.5 mg total) by mouth daily as needed (FOR OVERNIGHT WEIGHT GAIN OF 3  lbs. or more).   montelukast 10 MG tablet Commonly known as: SINGULAIR TAKE 1 TABLET(10 MG) BY MOUTH AT BEDTIME What changed: See the new instructions.   pantoprazole 40 MG tablet Commonly known as: PROTONIX Take 1 tablet (40 mg total) by mouth daily.   Potassium Chloride ER 20 MEQ Tbcr TAKE 3 TABLETS BY MOUTH 3 TIMES A DAY   predniSONE 20 MG tablet Commonly known as: DELTASONE Take 2 tablets (40 mg total) by mouth daily.   rosuvastatin 20 MG tablet Commonly known as: CRESTOR Take 1 tablet (20 mg total) by mouth daily.   spironolactone 25 MG tablet Commonly known as: ALDACTONE Take 0.5 tablets (12.5 mg total) by mouth 2 (two) times daily.   tizanidine 2 MG capsule Commonly known as: ZANAFLEX Take by mouth.   topiramate 25 MG tablet Commonly known as: TOPAMAX Take 25 mg by mouth 2 (two) times daily as needed (for migraines).   torsemide 20 MG tablet Commonly known as: DEMADEX Take 2 tablets (40 mg total) by mouth 2 (two) times daily.   traMADol 50 MG tablet Commonly known as: ULTRAM Take 1 tablet (50 mg total) by mouth 3 (three) times daily as needed for moderate pain.   Trelegy Ellipta 200-62.5-25 MCG/INH Aepb Generic drug:  Fluticasone-Umeclidin-Vilant Inhale 1 puff into the lungs daily.   vitamin C 250 MG tablet Commonly known as: ASCORBIC ACID Take 250 mg by mouth daily.       Signature:  Chesley Mires, MD [place] Pager - [phone] 06/12/2020, 12:55 PM

## 2020-06-13 ENCOUNTER — Telehealth: Payer: Self-pay | Admitting: Pulmonary Disease

## 2020-06-13 DIAGNOSIS — G4733 Obstructive sleep apnea (adult) (pediatric): Secondary | ICD-10-CM

## 2020-06-13 NOTE — Telephone Encounter (Signed)
Pt stated that [facility] did change her setting on her BIPAP yesterday and that now when she puts the mask on it feels like there is no air coming out when it is on her face, but if she takes it off she can feel the air.  She would like to know if VS can readjust her settings.  Thanks  This was the order that was sent in yesterday. VS please advise.    Please have [facility] change her Bipap to 13/9 cm H2O

## 2020-06-14 NOTE — Telephone Encounter (Signed)
Can have [facility] change Bipap back to 15/11 cm H2O.

## 2020-06-14 NOTE — Telephone Encounter (Signed)
Called and spoke with Patient.  Dr Sood's recommendations given.  Understanding stated. DME order placed. Nothing further at this time. °

## 2020-06-21 ENCOUNTER — Ambulatory Visit: Payer: 59 | Admitting: Gastroenterology

## 2020-06-30 ENCOUNTER — Other Ambulatory Visit: Payer: Self-pay

## 2020-07-03 ENCOUNTER — Telehealth: Payer: Self-pay | Admitting: Cardiovascular Disease

## 2020-07-03 MED ORDER — POTASSIUM CHLORIDE ER 20 MEQ PO TBCR: EXTENDED_RELEASE_TABLET | ORAL | 3 refills | Status: DC

## 2020-07-03 NOTE — Telephone Encounter (Signed)
*  STAT* If patient is at the pharmacy, call can be transferred to refill team.   1. Which medications need to be refilled? (please list name of each medication and dose if known) isosorbide dinitrate (ISORDIL) 20 MG tablet; Potassium Chloride ER 20 MEQ TBCR; losartan (COZAAR) 50 MG tablet  2. Which pharmacy/location (including street and city if local pharmacy) is medication to be sent to?  [id number] - [place], [place] - [address]  3. Do they need a 30 day or 90 day supply? 90 day for all

## 2020-07-19 ENCOUNTER — Other Ambulatory Visit: Payer: Self-pay | Admitting: Gastroenterology

## 2020-07-25 ENCOUNTER — Other Ambulatory Visit (HOSPITAL_BASED_OUTPATIENT_CLINIC_OR_DEPARTMENT_OTHER): Payer: Self-pay | Admitting: Cardiovascular Disease

## 2020-07-25 NOTE — Telephone Encounter (Signed)
Rx request sent to pharmacy.  

## 2020-07-31 ENCOUNTER — Other Ambulatory Visit: Payer: Self-pay | Admitting: Internal Medicine

## 2020-08-01 ENCOUNTER — Ambulatory Visit: Payer: 59 | Admitting: Pulmonary Disease

## 2020-08-04 ENCOUNTER — Inpatient Hospital Stay: Admission: RE | Admit: 2020-08-04 | Payer: 59 | Source: Ambulatory Visit

## 2020-08-24 ENCOUNTER — Ambulatory Visit: Payer: 59 | Admitting: Internal Medicine

## 2020-09-11 ENCOUNTER — Encounter: Payer: Self-pay | Admitting: Gastroenterology

## 2020-09-22 ENCOUNTER — Ambulatory Visit (HOSPITAL_BASED_OUTPATIENT_CLINIC_OR_DEPARTMENT_OTHER): Payer: 59 | Admitting: Cardiovascular Disease

## 2020-10-13 ENCOUNTER — Telehealth: Payer: Self-pay

## 2020-10-13 NOTE — Telephone Encounter (Signed)
Prior Authorization has been started for [employer]

## 2020-10-13 NOTE — Telephone Encounter (Signed)
Approved today: Your PA request has been approved. Additional information will be provided in the approval communication. Drug: Linzess 290MCG capsules

## 2020-10-20 ENCOUNTER — Ambulatory Visit: Payer: 59

## 2020-10-22 ENCOUNTER — Emergency Department (HOSPITAL_COMMUNITY)
Admission: EM | Admit: 2020-10-22 | Discharge: 2020-10-22 | Disposition: A | Discharge status: Home / Self Care | Payer: 59 | Attending: Emergency Medicine | Admitting: Emergency Medicine

## 2020-10-22 ENCOUNTER — Encounter (HOSPITAL_COMMUNITY): Payer: Self-pay

## 2020-10-22 ENCOUNTER — Emergency Department (HOSPITAL_COMMUNITY): Payer: 59

## 2020-10-22 ENCOUNTER — Other Ambulatory Visit: Payer: Self-pay

## 2020-10-22 DIAGNOSIS — J441 Chronic obstructive pulmonary disease with (acute) exacerbation: Secondary | ICD-10-CM | POA: Diagnosis not present

## 2020-10-22 DIAGNOSIS — Z79899 Other long term (current) drug therapy: Secondary | ICD-10-CM | POA: Insufficient documentation

## 2020-10-22 DIAGNOSIS — N183 Chronic kidney disease, stage 3 unspecified: Secondary | ICD-10-CM | POA: Diagnosis not present

## 2020-10-22 DIAGNOSIS — I5032 Chronic diastolic (congestive) heart failure: Secondary | ICD-10-CM | POA: Insufficient documentation

## 2020-10-22 DIAGNOSIS — Z91040 Latex allergy status: Secondary | ICD-10-CM | POA: Insufficient documentation

## 2020-10-22 DIAGNOSIS — N644 Mastodynia: Secondary | ICD-10-CM | POA: Insufficient documentation

## 2020-10-22 DIAGNOSIS — I2510 Atherosclerotic heart disease of native coronary artery without angina pectoris: Secondary | ICD-10-CM | POA: Insufficient documentation

## 2020-10-22 DIAGNOSIS — I130 Hypertensive heart and chronic kidney disease with heart failure and stage 1 through stage 4 chronic kidney disease, or unspecified chronic kidney disease: Secondary | ICD-10-CM | POA: Diagnosis not present

## 2020-10-22 DIAGNOSIS — Z7951 Long term (current) use of inhaled steroids: Secondary | ICD-10-CM | POA: Diagnosis not present

## 2020-10-22 DIAGNOSIS — Z8616 Personal history of COVID-19: Secondary | ICD-10-CM | POA: Insufficient documentation

## 2020-10-22 HISTORY — DX: Post covid-19 condition, unspecified: U09.9

## 2020-10-22 LAB — CBC WITH DIFFERENTIAL/PLATELET
Abs Immature Granulocytes: 0.08 10*3/uL — ABNORMAL HIGH (ref 0.00–0.07)
Basophils Absolute: 0 10*3/uL (ref 0.0–0.1)
Basophils Relative: 0 %
Eosinophils Absolute: 0.5 10*3/uL (ref 0.0–0.5)
Eosinophils Relative: 5 %
HCT: 35.6 % — ABNORMAL LOW (ref 36.0–46.0)
Hemoglobin: 11.3 g/dL — ABNORMAL LOW (ref 12.0–15.0)
Immature Granulocytes: 1 %
Lymphocytes Relative: 29 %
Lymphs Abs: 3 10*3/uL (ref 0.7–4.0)
MCH: 31 pg (ref 26.0–34.0)
MCHC: 31.7 g/dL (ref 30.0–36.0)
MCV: 97.5 fL (ref 80.0–100.0)
Monocytes Absolute: 0.7 10*3/uL (ref 0.1–1.0)
Monocytes Relative: 6 %
Neutro Abs: 6.1 10*3/uL (ref 1.7–7.7)
Neutrophils Relative %: 59 %
Platelets: 250 10*3/uL (ref 150–400)
RBC: 3.65 MIL/uL — ABNORMAL LOW (ref 3.87–5.11)
RDW: 14.1 % (ref 11.5–15.5)
WBC: 10.3 10*3/uL (ref 4.0–10.5)
nRBC: 0 % (ref 0.0–0.2)

## 2020-10-22 LAB — MAGNESIUM: Magnesium: 2.3 mg/dL (ref 1.7–2.4)

## 2020-10-22 LAB — VITAMIN B12: Vitamin B-12: 308 pg/mL (ref 180–914)

## 2020-10-22 LAB — BASIC METABOLIC PANEL
Anion gap: 4 — ABNORMAL LOW (ref 5–15)
BUN: 30 mg/dL — ABNORMAL HIGH (ref 8–23)
CO2: 28 mmol/L (ref 22–32)
Calcium: 8.5 mg/dL — ABNORMAL LOW (ref 8.9–10.3)
Chloride: 106 mmol/L (ref 98–111)
Creatinine, Ser: 1.19 mg/dL — ABNORMAL HIGH (ref 0.44–1.00)
GFR, Estimated: 52 mL/min — ABNORMAL LOW (ref >60)
Glucose, Bld: 115 mg/dL — ABNORMAL HIGH (ref 70–99)
Potassium: 4 mmol/L (ref 3.5–5.1)
Sodium: 138 mmol/L (ref 135–145)

## 2020-10-22 LAB — TROPONIN I (HIGH SENSITIVITY)
Troponin I (High Sensitivity): 3 ng/L (ref <18)
Troponin I (High Sensitivity): 3 ng/L (ref <18)

## 2020-10-22 LAB — BRAIN NATRIURETIC PEPTIDE: B Natriuretic Peptide: 27.1 pg/mL (ref 0.0–100.0)

## 2020-10-22 LAB — CK: Total CK: 66 U/L (ref 38–234)

## 2020-10-22 MED ORDER — HYDROCODONE-ACETAMINOPHEN 5-325 MG PO TABS
1.0000 | ORAL_TABLET | Freq: Once | ORAL | Status: AC
  Administered 2020-10-22: 1 via ORAL
  Filled 2020-10-22: qty 1

## 2020-10-22 NOTE — ED Triage Notes (Signed)
Patient c/o left breast pain since yesterday. 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 Patient denies any drainage from her nipple.  Patient reports numbness to the tips of her right fingers since yesterday.

## 2020-10-22 NOTE — Discharge Instructions (Signed)
It was a pleasure taking care of you today. As discussed, your labs were reassuring. Your heart labs were all normal. I suspect your breast pain is related to the fatty tumors. Please follow-up with OBGYN this week for further evaluation. Take over the counter ibuprofen or tylenol as needed for pain. Return to the ER for new or worsening symptoms.

## 2020-10-22 NOTE — ED Provider Notes (Signed)
[facility] DEPT Provider Note   CSN: [id number] Arrival date & time: 10/22/20  1115     History Chief Complaint  Patient presents with   Breast Pain    [name] is a 60 y.o. female with a past medical history significant for chronic diastolic congestive heart failure, CAD, COPD, GERD, hyperlipidemia who presents to the ED due to left breast pain x2 days.  Patient states she has a history of "fatty tumors" in her left breast.  Patient had a mammogram 1 year ago which she said was noncancerous.  She was told by her OB/GYN she might have issues down the line with her left breast.  Patient states she feels like the pain is located around where these "fatty tumors" are located.  Denies erythema to area.  No nipple drainage.  Pain is located on the lateral aspect of left breast.  She admits to baseline shortness of breath.  No change from her typical.  Denies central chest pain.  She also endorses bilateral numbness/tingling to fingertips.  History of vitamin D deficiency.  She has been compliant with her vitamin D.  No fever or chills.  She has been taking Tylenol with no relief.  Pain is worse with palpation of left breast.  History obtained from patient and past medical records. No interpreter used during encounter.       Past Medical History:  Diagnosis Date   Acute on chronic diastolic CHF (congestive heart failure) ([place]) 04/11/2014   Acute suppurative otitis media without spontaneous rupture of eardrum 02/04/2007   Centricity Description: OTITIS MEDIA, SUPPURATIVE, ACUTE, BILATERAL Qualifier: Diagnosis of  By: [name] MD, [name]  Centricity Description: OTITIS MEDIA, SUPPURATIVE, ACUTE Qualifier: Diagnosis of  By: [name] MD, Sean A    Allergic rhinitis    Allergy    Anemia    Anxiety    Arthritis    CAD (coronary artery disease)    LHC (11/29/1999):  EF 60%; no significant CAD.   Carpal tunnel syndrome    Carpal tunnel syndrome 06/09/2006    Qualifier: Diagnosis of  By: Marca Ancona RMA, Lucy     Chronic diastolic CHF (congestive heart failure) ([place])    a. Echo (01/21/11): Vigorous LVF, EF 65-70%, no RWMA, Gr 2 DD. b.  Echo (12/15): Moderate LVH, EF 24-58%, grade 2 diastolic dysfunction, mild LAE, normal RV function c. 01/2015: echo showing EF of 60-65% with mild LVH   Common migraine    COPD (chronic obstructive pulmonary disease) (HCC)    Diastolic heart failure (HCC)    Diverticulitis    Dyspnea    GERD (gastroesophageal reflux disease)    Hepatic steatosis    Hepatomegaly    Hiatal hernia    History of cardiomegaly    Hyperlipidemia    Hypertension    IBS (irritable bowel syndrome)    Internal hemorrhoids    Long COVID    Metabolic syndrome    Morbid obesity (HCC)    OSA (obstructive sleep apnea)    CPAP dependent   Pneumonia    PNEUMONIA, ORGAN UNSPECIFIED 03/01/2009   Qualifier: Diagnosis of  By: [name] CMA, Jennifer     Sinusitis, chronic 03/10/2012   CT sinuses 02/2012:  Chronic sinusitis with small A/F levels >> rx by ENT with prolonged augmentin F/u ct sinuses 04/2012:  Persistent sinus thickening >> rx with levaquin and clinda for 30 days.  Told to return for sinus scan in 6 weeks >> no showed for followup visit.  CT sinuses 11/2013:  Acute and chronic sinusitis noted.     Sleep apnea    bi pap    Patient Active Problem List   Diagnosis Date Noted   Constipation 05/10/2020   Gastroesophageal reflux disease 05/10/2020   CAP (community acquired pneumonia) 11/11/2018   Abdominal pain, chronic, epigastric    Abdominal pain    Gastritis and gastroduodenitis    Bronchitis 02/12/2018   Polyp of sigmoid colon    CKD (chronic kidney disease) stage 3, GFR 30-59 ml/min (HCC) 07/08/2016   Cough variant asthma 07/31/2015   Upper airway cough syndrome 04/11/2015   Bronchitis, chronic obstructive w acute bronchitis ([place]) 04/11/2015   Dyspnea    Chronic diastolic heart failure ([place]) 03/27/2015   Morbid obesity due to  excess calories ([place]) 03/27/2015   Dysphagia 03/27/2015   Hyponatremia 03/27/2015   Acute kidney injury ([place]) 03/27/2015   Hypokalemia 03/27/2015   Leukocytosis 03/27/2015   COPD exacerbation ([place]) 03/27/2015   Other irritable bowel syndrome 04/13/2014   Sinusitis, chronic 03/10/2012   Hyperlipidemia 10/02/2006   Obstructive sleep apnea 06/09/2006   Essential hypertension 06/09/2006    Past Surgical History:  Procedure Laterality Date   ABDOMINAL HYSTERECTOMY     BIOPSY  06/22/2018   Procedure: BIOPSY;  Surgeon: Mauri Pole, MD;  Location: [facility];  Service: Endoscopy;;   BIOPSY  07/28/2018   Procedure: BIOPSY;  Surgeon: Mauri Pole, MD;  Location: [facility];  Service: Endoscopy;;   CESAREAN SECTION     COLONOSCOPY     COLONOSCOPY WITH PROPOFOL N/A 05/05/2017   Procedure: COLONOSCOPY WITH PROPOFOL;  Surgeon: Mauri Pole, MD;  Location: [facility];  Service: Endoscopy;  Laterality: N/A;   ESOPHAGOGASTRODUODENOSCOPY (EGD) WITH PROPOFOL N/A 06/22/2018   Procedure: ESOPHAGOGASTRODUODENOSCOPY (EGD) WITH PROPOFOL;  Surgeon: Mauri Pole, MD;  Location: [facility];  Service: Endoscopy;  Laterality: N/A;   ESOPHAGOGASTRODUODENOSCOPY (EGD) WITH PROPOFOL N/A 07/28/2018   Procedure: ESOPHAGOGASTRODUODENOSCOPY (EGD) WITH PROPOFOL;  Surgeon: Mauri Pole, MD;  Location: [facility];  Service: Endoscopy;  Laterality: N/A;   NASAL SINUS SURGERY     PARTIAL HYSTERECTOMY     SINUS ENDO WITH FUSION Left 05/28/2019   Procedure: SINUS ENDO WITH FUSION;  Surgeon: [name], MD;  Location: [facility];  Service: ENT;  Laterality: Left;   TONSILLECTOMY     TUBAL LIGATION     TURBINATE REDUCTION Bilateral 05/28/2019   Procedure: TURBINATE REDUCTION;  Surgeon: [name], MD;  Location: [facility];  Service: ENT;  Laterality: Bilateral;   UVULOPALATOPHARYNGOPLASTY       OB History   No obstetric history on file.     Family History  Problem Relation Age of  Onset   Heart disease Mother    Heart attack Mother    Hypertension Mother    Coronary artery disease Other        1st degree relatvie <50   Breast cancer Other        aunt- ? paternal or maternal   Asthma Sister    Colon polyps Sister    Stroke Neg Hx    Colon cancer Neg Hx    Esophageal cancer Neg Hx    Rectal cancer Neg Hx    Stomach cancer Neg Hx     Social History   Tobacco Use   Smoking status: Never   Smokeless tobacco: Never  Vaping Use   Vaping Use: Never used  Substance Use Topics   Alcohol use: No  Drug use: No    Home Medications Prior to Admission medications   Medication Sig Start Date End Date Taking? Authorizing Provider  acetaminophen (TYLENOL 8 HOUR) 650 MG CR tablet Take 1 tablet (650 mg total) by mouth every 8 (eight) hours as needed for pain. 12/18/19  Yes Suzy Bouchard, PA-C  Azelastine-Fluticasone 137-50 MCG/ACT SUSP Place 137 mcg into the nose in the morning, at noon, and at bedtime.   Yes [provider]  benzonatate (TESSALON) 100 MG capsule Take 100 mg by mouth 3 (three) times daily as needed for cough. 10/09/20  Yes [provider]  carvedilol (COREG) 12.5 MG tablet Take 1 tablet (12.5 mg total) by mouth 2 (two) times daily with a meal. 04/19/20  Yes Skeet Latch, MD  diclofenac (VOLTAREN) 75 MG EC tablet Take 75 mg by mouth 2 (two) times daily as needed for mild pain. 08/22/20  Yes [provider]  dicyclomine (BENTYL) 10 MG capsule Take 1 capsule (10 mg total) by mouth every 8 (eight) hours as needed for spasms. 04/24/20  Yes Nandigam, Venia Minks, MD  EPINEPHrine 0.3 mg/0.3 mL IJ SOAJ injection Inject 0.3 mg into the muscle as needed for anaphylaxis. 12/19/19  Yes [provider]  ergocalciferol (VITAMIN D2) 1.25 MG (50000 UT) capsule Take 50,000 Units by mouth once a week. Monday's 05/18/20  Yes [provider]  FARXIGA 10 MG TABS tablet Take 10 mg by mouth daily. 10/18/20  Yes [provider]  fluticasone (FLONASE) 50 MCG/ACT nasal spray Place 1 spray into the nose daily. 03/03/20  Yes [provider]  hydrALAZINE (APRESOLINE) 25 MG tablet Take 0.5 tablets (12.5 mg total) by mouth 3 (three) times daily. 01/28/20  Yes Skeet Latch, MD  ipratropium (ATROVENT) 0.06 % nasal spray Place 2 sprays into both nostrils 3 (three) times daily.   Yes [provider]  ipratropium-albuterol (DUONEB) 0.5-2.5 (3) MG/3ML SOLN Take 3 mLs by nebulization in the morning, at noon, in the evening, and at bedtime.   Yes [provider]  isosorbide dinitrate (ISORDIL) 20 MG tablet Take 1 tablet (20 mg total) by mouth 3 (three) times daily. 04/19/20  Yes Skeet Latch, MD  levocetirizine (XYZAL) 5 MG tablet Take 5 mg by mouth every evening.   Yes [provider]  LINZESS 290 MCG CAPS capsule TAKE 1 CAPSULE BY MOUTH DAILY BEFORE BREAKFAST. Patient taking differently: Take 290 mcg by mouth daily. 07/20/20  Yes Zehr, Laban Emperor, PA-C  losartan (COZAAR) 50 MG tablet TAKE 1 TABLET BY MOUTH EVERY DAY Patient taking differently: Take 50 mg by mouth daily. 07/25/20  Yes Skeet Latch, MD  meclizine (ANTIVERT) 25 MG tablet Take 25 mg by mouth 2 (two) times daily as needed for dizziness.   Yes [provider]  metolazone (ZAROXOLYN) 2.5 MG tablet Take 1 tablet (2.5 mg total) by mouth daily as needed (FOR OVERNIGHT WEIGHT GAIN OF 3 lbs. or more). 02/28/19  Yes Skeet Latch, MD  montelukast (SINGULAIR) 10 MG tablet TAKE 1 TABLET(10 MG) BY MOUTH AT BEDTIME Patient taking differently: Take 10 mg by mouth at bedtime. 07/25/17  Yes Tanda Rockers, MD  pantoprazole (PROTONIX) 40 MG tablet Take 1 tablet (40 mg total) by mouth daily. 05/10/20  Yes Zehr, Laban Emperor, PA-C  Potassium Chloride ER 20 MEQ TBCR TAKE 3 TABLETS BY MOUTH 3 TIMES A DAY Patient taking differently: Take 60 mEq by mouth in the morning, at noon, and at bedtime. 07/03/20  Yes Skeet Latch,  MD  rosuvastatin  (CRESTOR) 20 MG tablet Take 1 tablet (20 mg total) by mouth daily. 12/13/19 12/30/20 Yes Skeet Latch, MD  spironolactone (ALDACTONE) 25 MG tablet Take 0.5 tablets (12.5 mg total) by mouth 2 (two) times daily. 03/27/20  Yes Skeet Latch, MD  topiramate (TOPAMAX) 25 MG tablet Take 25 mg by mouth 2 (two) times daily as needed (for migraines).    Yes [provider]  torsemide (DEMADEX) 20 MG tablet TAKE 2 TABLETS BY MOUTH 2 TIMES DAILY. Patient taking differently: Take 40 mg by mouth 2 (two) times daily. 07/25/20  Yes Skeet Latch, MD  traMADol (ULTRAM) 50 MG tablet Take 1 tablet (50 mg total) by mouth 3 (three) times daily as needed for moderate pain. 01/19/20  Yes Young, Tarri Fuller D, MD  TRELEGY ELLIPTA 200-62.5-25 MCG/INH AEPB Inhale 1 puff into the lungs daily. 05/23/20  Yes [provider]  vitamin C (ASCORBIC ACID) 250 MG tablet Take 250 mg by mouth daily.   Yes [provider]  predniSONE (DELTASONE) 20 MG tablet Take 2 tablets (40 mg total) by mouth daily. Patient not taking: No sig reported 06/07/20   Little, Wenda Overland, MD  Respiratory Therapy Supplies (FLUTTER) DEVI 1 application by Does not apply route as directed. 12/27/16   Tanda Rockers, MD    Allergies    Sulfonamide derivatives, Doxycycline, Latex, Ciprofloxacin, Fish allergy, Hydrocortisone, and Neomycin  Review of Systems   Review of Systems  Constitutional:  Negative for chills and fever.  Respiratory:  Positive for shortness of breath (baseline SOB).   Cardiovascular:  Positive for chest pain (left breast pain).   Physical Exam Updated Vital Signs BP 114/62   Pulse 60   Temp 98.1 F (36.7 C) (Oral)   Resp 13   Ht 5\' 3"  (1.6 m)   Wt 131.5 kg   SpO2 96%   BMI 51.37 kg/m   Physical Exam Vitals and nursing note reviewed.  Constitutional:      General: She is not in acute distress.    Appearance: She is not ill-appearing.  HENT:     Head: Normocephalic.  Eyes:     Pupils:  Pupils are equal, round, and reactive to light.  Cardiovascular:     Rate and Rhythm: Normal rate and regular rhythm.     Pulses: Normal pulses.     Heart sounds: Normal heart sounds. No murmur heard.   No friction rub. No gallop.  Pulmonary:     Effort: Pulmonary effort is normal.     Breath sounds: Normal breath sounds.  Chest:     Comments: Tenderness over lateral aspect of left breast.  No overlying erythema.  No nipple drainage.  No skin changes around areola. Abdominal:     General: Abdomen is flat. There is no distension.     Palpations: Abdomen is soft.     Tenderness: There is no abdominal tenderness. There is no guarding or rebound.  Musculoskeletal:        General: Normal range of motion.     Cervical back: Neck supple.     Comments: 1+ pitting edema bilaterally  Skin:    General: Skin is warm and dry.  Neurological:     General: No focal deficit present.     Mental Status: She is alert.  Psychiatric:        Mood and Affect: Mood normal.        Behavior: Behavior normal.    ED Results / Procedures / Treatments  Labs (all labs ordered are listed, but only abnormal results are displayed) Labs Reviewed  CBC WITH DIFFERENTIAL/PLATELET - Abnormal; Notable for the following components:      Result Value   RBC 3.65 (*)    Hemoglobin 11.3 (*)    HCT 35.6 (*)    Abs Immature Granulocytes 0.08 (*)    All other components within normal limits  BASIC METABOLIC PANEL - Abnormal; Notable for the following components:   Glucose, Bld 115 (*)    BUN 30 (*)    Creatinine, Ser 1.19 (*)    Calcium 8.5 (*)    GFR, Estimated 52 (*)    Anion gap 4 (*)    All other components within normal limits  CK  MAGNESIUM  VITAMIN B12  BRAIN NATRIURETIC PEPTIDE  TROPONIN I (HIGH SENSITIVITY)  TROPONIN I (HIGH SENSITIVITY)    EKG EKG Interpretation  Date/Time:  Sunday October 22 2020 12:37:21 EDT Ventricular Rate:  59 PR Interval:  194 QRS Duration: 102 QT Interval:  422 QTC  Calculation: 418 R Axis:   45 Text Interpretation: Sinus rhythm Low voltage, precordial leads Confirmed by Trifan, Matthew ([id number]) on 10/22/2020 4:56:55 PM  Radiology DG Chest 2 View  Result Date: 10/22/2020 CLINICAL DATA:  Chest pain. EXAM: CHEST - 2 VIEW COMPARISON:  Chest x-ray from same day at 1220 hours. FINDINGS: Cardiomegaly. Normal mediastinal contours. Normal pulmonary vascularity. No focal consolidation, pleural effusion, or pneumothorax. No acute osseous abnormality. IMPRESSION: 1. Cardiomegaly.  No active disease. Electronically Signed   By: William T Derry M.D.   On: 10/22/2020 14:43   DG Chest Portable 1 View  Result Date: 10/22/2020 CLINICAL DATA:  Left breast pain since yesterday. EXAM: PORTABLE CHEST 1 VIEW COMPARISON:  Jun 07, 2020 FINDINGS: Prominence of the mediastinum is likely due to portable technique and is new since May of 2022. No pneumothorax. No nodules or masses. No focal infiltrates. IMPRESSION: Prominence of the mediastinum is probably due to portable technique. Recommend a PA and lateral chest x-ray before discharge. No other abnormalities. Electronically Signed   By: David  Williams III M.D.   On: 10/22/2020 12:50    Procedures Procedures   Medications Ordered in ED Medications  HYDROcodone-acetaminophen (NORCO/VICODIN) 5-325 MG per tablet 1 tablet (1 tablet Oral Given 10/22/20 1257)    ED Course  I have reviewed the triage vital signs and the nursing notes.  Pertinent labs & imaging results that were available during my care of the patient were reviewed by me and considered in my medical decision making (see chart for details).    MDM Rules/Calculators/A&P                           59  year old female presents to the ED due to left breast pain x2 days.  Patient had a mammogram 1 year ago and was noted to have noncancerous tumors in her left breast.  OB/GYN informed patient she may have issues down the line with pain.  Patient also endorses bilateral  finger numbness/tingling.  No fever or chills.  Upon arrival, stable vitals.  Patient is afebrile, not tachycardic or hypoxic.  Reproducible tenderness throughout left breast.  No overlying erythema or warmth.  Low suspicion for infectious etiology.  Suspect symptoms related to fatty tumors however, will rule out cardiac etiology.  We will also obtain routine labs, B12, and magnesium due to finger numbness/tingling.  CBC reassuring with no leukocytosis.  Mild anemia with hemoglobin 11.3.  BMP significant for elevated creatinine 1.19 and BUN at 30 which appears to be around patient's baseline.  Hyperglycemia 115.  No major electrolyte derangements.  CK, vitamin B12, magnesium, and BNP normal.  Delta troponin flat. EKG demonstrates NSR with no signs of acute ischemia. Low suspicion for cardiac etiology. No tachycardia, hypoxia, or evidence of DVT on exam. Low suspicion for DVT/PE. Low suspicion for dissection. Suspect pain related to breast mass. No signs of infection.  Advised patient to follow-up with OB/GYN within the next week for further evaluation. Strict ED precautions discussed with patient. Patient states understanding and agrees to plan. Patient discharged home in no acute distress and stable vitals  Final Clinical Impression(s) / ED Diagnoses Final diagnoses:  Breast pain, left    Rx / DC Orders ED Discharge Orders     None        Karie Kirks 10/22/20 1736    Gareth Morgan, MD 10/22/20 2358

## 2020-10-23 ENCOUNTER — Ambulatory Visit
Admission: RE | Admit: 2020-10-23 | Discharge: 2020-10-23 | Disposition: A | Discharge status: Home / Self Care | Payer: 59 | Source: Ambulatory Visit | Attending: Family Medicine | Admitting: Family Medicine

## 2020-10-23 DIAGNOSIS — Z1231 Encounter for screening mammogram for malignant neoplasm of breast: Secondary | ICD-10-CM

## 2020-10-27 ENCOUNTER — Other Ambulatory Visit (HOSPITAL_BASED_OUTPATIENT_CLINIC_OR_DEPARTMENT_OTHER): Payer: Self-pay | Admitting: Cardiovascular Disease

## 2020-10-27 NOTE — Telephone Encounter (Signed)
Rx(s) sent to pharmacy electronically.  

## 2020-11-02 ENCOUNTER — Other Ambulatory Visit (HOSPITAL_BASED_OUTPATIENT_CLINIC_OR_DEPARTMENT_OTHER): Payer: Self-pay | Admitting: Cardiovascular Disease

## 2020-11-07 ENCOUNTER — Encounter (HOSPITAL_BASED_OUTPATIENT_CLINIC_OR_DEPARTMENT_OTHER): Payer: Self-pay | Admitting: Cardiovascular Disease

## 2020-11-07 ENCOUNTER — Other Ambulatory Visit: Payer: Self-pay

## 2020-11-07 ENCOUNTER — Ambulatory Visit (INDEPENDENT_AMBULATORY_CARE_PROVIDER_SITE_OTHER): Payer: 59 | Admitting: Cardiovascular Disease

## 2020-11-07 DIAGNOSIS — G4733 Obstructive sleep apnea (adult) (pediatric): Secondary | ICD-10-CM

## 2020-11-07 DIAGNOSIS — I10 Essential (primary) hypertension: Secondary | ICD-10-CM

## 2020-11-07 DIAGNOSIS — R0789 Other chest pain: Secondary | ICD-10-CM

## 2020-11-07 DIAGNOSIS — I5032 Chronic diastolic (congestive) heart failure: Secondary | ICD-10-CM | POA: Diagnosis not present

## 2020-11-07 MED ORDER — HYDRALAZINE HCL 25 MG PO TABS: 25.0000 mg | ORAL_TABLET | Freq: Three times a day (TID) | ORAL | 1 refills | Status: DC

## 2020-11-07 NOTE — Assessment & Plan Note (Signed)
She is euvolemic.  Continue carvedilol, hydralazine, Imdur, losartan, spironolactone and torsemide.

## 2020-11-07 NOTE — Patient Instructions (Addendum)
Medication Instructions:  INCREASE YOUR HYDRALAZINE TO 25 MG THREE TIMES A DAY   TAKE CARVEDILOL 25 MG THE MORNING OF YOUR CARDIAC CT   *If you need a refill on your cardiac medications before your next appointment, please call your pharmacy*  Lab Work: BMET 1 WEEK PRIOR TO CARDIAC CT   If you have labs (blood work) drawn today and your tests are completely normal, you will receive your results only by: [place] (if you have MyChart) OR A paper copy in the mail If you have any lab test that is abnormal or we need to change your treatment, we will call you to review the results.  Testing/Procedures: Your physician has requested that you have cardiac CT. Cardiac computed tomography (CT) is a painless test that uses an x-ray machine to take clear, detailed pictures of your heart. For further information please visit [email or web address]. Please follow instruction sheet as given. THE OFFICE WILL CALL YOU ONCE YOUR INSURANCE HAS BEEN REVIEWED   Follow-Up: At [facility], you and your health needs are our priority.  As part of our continuing mission to provide you with exceptional heart care, we have created designated Provider Care Teams.  These Care Teams include your primary Cardiologist (physician) and Advanced Practice Providers (APPs -  Physician Assistants and Nurse Practitioners) who all work together to provide you with the care you need, when you need it.  We recommend signing up for the patient portal called "MyChart".  Sign up information is provided on this After Visit Summary.  MyChart is used to connect with patients for Virtual Visits (Telemedicine).  Patients are able to view lab/test results, encounter notes, upcoming appointments, etc.  Non-urgent messages can be sent to your provider as well.   To learn more about what you can do with MyChart, go to [email or web address].    Your next appointment:   3 month(s)  The format for your next appointment:   In  Person  Provider:   Skeet Latch, MD  Other Instructions    Your cardiac CT will be scheduled at one of the below locations:   [facility] ([place]) [address] [place], [place] [address] [phone]  [address] [place], [place] [address] [phone]  If scheduled at [facility], please arrive at the [facility] main entrance (entrance A) of [facility] 30 minutes prior to test start time. You can use the FREE valet parking offered at the main entrance (encouraged to control the heart rate for the test) Proceed to the [facility] Radiology Department (first floor) to check-in and test prep.  If scheduled at [facility], please arrive 15 mins early for check-in and test prep.  Please follow these instructions carefully (unless otherwise directed):  Hold all erectile dysfunction medications at least 3 days (72 hrs) prior to test.  On the Night Before the Test: Be sure to Drink plenty of water. Do not consume any caffeinated/decaffeinated beverages or chocolate 12 hours prior to your test. Do not take any antihistamines 12 hours prior to your test. If the patient has contrast allergy: Patient will need a prescription for Prednisone and very clear instructions (as follows): Prednisone 50 mg - take 13 hours prior to test Take another Prednisone 50 mg 7 hours prior to test Take another Prednisone 50 mg 1 hour prior to test Take Benadryl 50 mg 1 hour prior to test Patient must complete all four  doses of above prophylactic medications. Patient will need a ride after test due to Benadryl.  On the Day of the Test: Drink plenty of water until 1 hour prior to the test. Do not eat any food 4 hours prior to the test. You may take your regular medications prior to the test.  HOLD Furosemide/Hydrochlorothiazide morning of the test. FEMALES- please wear  underwire-free bra if available, avoid dresses & tight clothing      After the Test: Drink plenty of water. After receiving IV contrast, you may experience a mild flushed feeling. This is normal. On occasion, you may experience a mild rash up to 24 hours after the test. This is not dangerous. If this occurs, you can take Benadryl 25 mg and increase your fluid intake. If you experience trouble breathing, this can be serious. If it is severe call 911 IMMEDIATELY. If it is mild, please call our office. If you take any of these medications: Glipizide/Metformin, Avandament, Glucavance, please do not take 48 hours after completing test unless otherwise instructed.  Please allow 2-4 weeks for scheduling of routine cardiac CTs. Some insurance companies require a pre-authorization which may delay scheduling of this test.   For non-scheduling related questions, please contact the cardiac imaging nurse navigator should you have any questions/concerns: Marchia Bond, Cardiac Imaging Nurse Navigator Gordy Clement, Cardiac Imaging Nurse Navigator [facility] Direct Office Dial: [phone]   For scheduling needs, including cancellations and rescheduling, please call [place], [phone].  Cardiac CT Angiogram A cardiac CT angiogram is a procedure to look at the heart and the area around the heart. It may be done to help find the cause of chest pains or other symptoms of heart disease. During this procedure, a substance called contrast dye is injected into the blood vessels in the area to be checked. A large X-ray machine, called a CT scanner, then takes detailed pictures of the heart and the surrounding area. The procedure is also sometimes called a coronary CT angiogram, coronary artery scanning, or CTA. A cardiac CT angiogram allows the health care provider to see how well blood is flowing to and from the heart. The health care provider will be able to see if there are any  problems, such as: Blockage or narrowing of the coronary arteries in the heart. Fluid around the heart. Signs of weakness or disease in the muscles, valves, and tissues of the heart. Tell a health care provider about: Any allergies you have. This is especially important if you have had a previous allergic reaction to contrast dye. All medicines you are taking, including vitamins, herbs, eye drops, creams, and over-the-counter medicines. Any blood disorders you have. Any surgeries you have had. Any medical conditions you have. Whether you are pregnant or may be pregnant. Any anxiety disorders, chronic pain, or other conditions you have that may increase your stress or prevent you from lying still. What are the risks? Generally, this is a safe procedure. However, problems may occur, including: Bleeding. Infection. Allergic reactions to medicines or dyes. Damage to other structures or organs. Kidney damage from the contrast dye that is used. Increased risk of cancer from radiation exposure. This risk is low. Talk with your health care provider about: The risks and benefits of testing. How you can receive the lowest dose of radiation. What happens before the procedure? Wear comfortable clothing and remove any jewelry, glasses, dentures, and hearing aids. Follow instructions from your health care provider about eating and drinking. This may  include: For 12 hours before the procedure -- avoid caffeine. This includes tea, coffee, soda, energy drinks, and diet pills. Drink plenty of water or other fluids that do not have caffeine in them. Being well hydrated can prevent complications. For 4-6 hours before the procedure -- stop eating and drinking. The contrast dye can cause nausea, but this is less likely if your stomach is empty. Ask your health care provider about changing or stopping your regular medicines. This is especially important if you are taking diabetes medicines, blood thinners, or  medicines to treat problems with erections (erectile dysfunction). What happens during the procedure?  Hair on your chest may need to be removed so that small sticky patches called electrodes can be placed on your chest. These will transmit information that helps to monitor your heart during the procedure. An IV will be inserted into one of your veins. You might be given a medicine to control your heart rate during the procedure. This will help to ensure that good images are obtained. You will be asked to lie on an exam table. This table will slide in and out of the CT machine during the procedure. Contrast dye will be injected into the IV. You might feel warm, or you may get a metallic taste in your mouth. You will be given a medicine called nitroglycerin. This will relax or dilate the arteries in your heart. The table that you are lying on will move into the CT machine tunnel for the scan. The person running the machine will give you instructions while the scans are being done. You may be asked to: Keep your arms above your head. Hold your breath. Stay very still, even if the table is moving. When the scanning is complete, you will be moved out of the machine. The IV will be removed. The procedure may vary among health care providers and hospitals. What can I expect after the procedure? After your procedure, it is common to have: A metallic taste in your mouth from the contrast dye. A feeling of warmth. A headache from the nitroglycerin. Follow these instructions at home: Take over-the-counter and prescription medicines only as told by your health care provider. If you are told, drink enough fluid to keep your urine pale yellow. This will help to flush the contrast dye out of your body. Most people can return to their normal activities right after the procedure. Ask your health care provider what activities are safe for you. It is up to you to get the results of your procedure. Ask your  health care provider, or the department that is doing the procedure, when your results will be ready. Keep all follow-up visits as told by your health care provider. This is important. Contact a health care provider if: You have any symptoms of allergy to the contrast dye. These include: Shortness of breath. Rash or hives. A racing heartbeat. Summary A cardiac CT angiogram is a procedure to look at the heart and the area around the heart. It may be done to help find the cause of chest pains or other symptoms of heart disease. During this procedure, a large X-ray machine, called a CT scanner, takes detailed pictures of the heart and the surrounding area after a contrast dye has been injected into blood vessels in the area. Ask your health care provider about changing or stopping your regular medicines before the procedure. This is especially important if you are taking diabetes medicines, blood thinners, or medicines to treat erectile dysfunction.  If you are told, drink enough fluid to keep your urine pale yellow. This will help to flush the contrast dye out of your body. This information is not intended to replace advice given to you by your health care provider. Make sure you discuss any questions you have with your health care provider. Document Revised: 09/02/2018 Document Reviewed: 09/02/2018 Elsevier Patient Education  [place]

## 2020-11-07 NOTE — Assessment & Plan Note (Signed)
She has OSA and uses her BiPAP but still has daytime somnolence.  Recommend that she follow up with her sleep medicine.doctor.

## 2020-11-07 NOTE — Assessment & Plan Note (Signed)
She notes chest tightness when coughing.  She also gets shortness of breath when walking.  It has been worse since she had COVID-19.  Likely pulmonary related.  She had a chest CT in2021 without coronary calcification.  We will get a coronary CT-A to assess for obstructive CAD.

## 2020-11-07 NOTE — Assessment & Plan Note (Signed)
Blood pressure is poorly controlled today.  Her BP is elevated today.  At home she notes that it has been in the 120-140s/60s.  We will increase hydralazine to 25mg  tid and continue carvedilol, Imdur and spironolactone.

## 2020-11-07 NOTE — Progress Notes (Signed)
Cardiology Office Note   Date:  11/07/2020   ID:  [name], DOB [birth year], MRN [id number]  PCP:  Bartholome Bill, MD  Cardiologist:  Skeet Latch, MD  Electrophysiologist:  None   Evaluation Performed:  Follow-Up Visit  Chief Complaint:  Shortness of breath  History of Present Illness:    [name] is a 60 y.o. female with chronic diastolic heart failure, hypertension, hyperlipidemia, OSA on BiPAP, morbid obesity, and COPD who presents for follow up.  Ms. Goswick was previously a patient of Dr. Percival Spanish.  She last saw him 02/2016 and was doing well.  June 2018 she developed hypotension and worsened renal function so her diuretics were held. She developed acute shortness of breath.  BNP was 31 and cardiac enzymes were negative.  She was diuresed with IV lasix. She followed up with her nephrologist and was started on torsemide 40mg  bid.  At her last appointment she reported palpitations.  She wore a 14-day event monitor 10/2016 that revealed an average heart rate of 87 bpm but episodes of palpitations at which time she was noted to be in sinus tachycardia at 120 bpm.  She also had PACs. She was treated for bronchitis while visiting [place] 01/2017.  Her breathing has improved somewhat since then but she continues to struggle.  Carvedilol was added due to poorly controlled blood pressure.  She also reported exertional dyspnea.  She was referred for an echocardiogram 03/03/2017 that revealed LVEF 65 to 70% with grade 1 diastolic dysfunction.  She also had a Lexiscan Myoview 03/2017 that revealed LVEF 77% and no ischemia.  She called our office with lightheadedness and dizziness.  Her blood pressure at the time was 121/56.  Hydralazine was decreased to 25 mg 3 times daily.  She followed up with Jory Sims, DNP, on 03/2017.  At that time her blood pressure was well-controlled.  She continued to report dizziness despite lack of orthostasis on exam.  She recently returned from [place].   While there she had two flares of bronchitis while there.  She was treated with doxycycline and prednisone.  At her last appointment her blood pressure was running low when she had not yet taken her medication.  Therefore hydrochlorothiazide was reduced to 12.5 mg.  Since her last appointment she was diagnosed with COVID 09/2018.  She was in [place] and caught it from family members.  She was treated at home with antibiotics and inhalers and did not require hospitalization.  She still chest mucous and chest discomfort.  Her chest discomfort occurs when lying down.  It feels like a sharp apin that is better with deep breaths.  She has no exertional chest pain.  She also struggles with pain and swelling in her R LE.  She has chronic knee pain and is due to have an injection.  However this pain has been worse than usual.  She recently traveled from [place] by car.  She is unsure what her blood pressure has been running)  Ms. Benge had lower extremity Dopplers 02/2019 that were negative for DVT.  She noted shortness of breath.  She had a repeat echocardiogram that revealed LVEF greater than 75% with moderate LVH.  Diastolic function was indeterminate.  IVC was less than 1.2 cm and spontaneously collapsed, suggestive of volume depletion.  She was instructed to not use metolazone and use diuretics sparingly.   Today, she appears well. She paused her medication for 7 days but on day 2 she  started coughing with phlegm. These symptoms may be related to long-term Covid. When she coughs, she feels chest tightness and has trouble breathing. Walking short distances like from the parking lot into the building also makes her short of breath. She has had 3 falls and injured her L hip. She is in physical therapy at [facility] and participates in water therapy twice a week for 1.5 hours. After 2 days, she feels weakness in her bilateral knees. She is wearing leg bands in clinic today for physical therapy. She uses a bipap machine  at night but falls asleep very quickly. She reports waking up tired. She monitors her blood pressure at home and records numbers of 120s-140s/60s-70s. She denies any palpitations, chest pain, lightheadedness, headaches, syncope, orthopnea, PND, or lower extremity edema.    Past Medical History:  Diagnosis Date   Acute on chronic diastolic CHF (congestive heart failure) ([facility]) 04/11/2014   Acute suppurative otitis media without spontaneous rupture of eardrum 02/04/2007   Centricity Description: OTITIS MEDIA, SUPPURATIVE, ACUTE, BILATERAL Qualifier: Diagnosis of  By: [name] MD, [name]  Centricity Description: OTITIS MEDIA, SUPPURATIVE, ACUTE Qualifier: Diagnosis of  By: [name] MD, Sean A    Allergic rhinitis    Allergy    Anemia    Anxiety    Arthritis    CAD (coronary artery disease)    LHC (11/29/1999):  EF 60%; no significant CAD.   Carpal tunnel syndrome    Carpal tunnel syndrome 06/09/2006   Qualifier: Diagnosis of  By: Marca Ancona RMA, Lucy     Chronic diastolic CHF (congestive heart failure) ([facility])    a. Echo (01/21/11): Vigorous LVF, EF 65-70%, no RWMA, Gr 2 DD. b.  Echo (12/15): Moderate LVH, EF 66-06%, grade 2 diastolic dysfunction, mild LAE, normal RV function c. 01/2015: echo showing EF of 60-65% with mild LVH   Common migraine    COPD (chronic obstructive pulmonary disease) (H[facility])    Diastolic heart failure (H[facility])    Diverticulitis    Dyspnea    GERD (gastroesophageal reflux disease)    Hepatic steatosis    Hepatomegaly    Hiatal hernia    History of cardiomegaly    Hyperlipidemia    Hypertension    IBS (irritable bowel syndrome)    Internal hemorrhoids    Long COVID    Metabolic syndrome    Morbid obesity (H[facility])    OSA (obstructive sleep apnea)    CPAP dependent   Pneumonia    PNEUMONIA, ORGAN UNSPECIFIED 03/01/2009   Qualifier: Diagnosis of  By: [name] CMA, Jennifer     Sinusitis, chronic 03/10/2012   CT sinuses 02/2012:  Chronic sinusitis with small A/F levels >> rx  by ENT with prolonged augmentin F/u ct sinuses 04/2012:  Persistent sinus thickening >> rx with levaquin and clinda for 30 days.  Told to return for sinus scan in 6 weeks >> no showed for followup visit.  CT sinuses 11/2013:  Acute and chronic sinusitis noted.     Sleep apnea    bi pap   Past Surgical History:  Procedure Laterality Date   ABDOMINAL HYSTERECTOMY     BIOPSY  06/22/2018   Procedure: BIOPSY;  Surgeon: Mauri Pole, MD;  Location: [facility];  Service: Endoscopy;;   BIOPSY  07/28/2018   Procedure: BIOPSY;  Surgeon: Mauri Pole, MD;  Location: [facility];  Service: Endoscopy;;   CESAREAN SECTION     COLONOSCOPY     COLONOSCOPY WITH PROPOFOL N/A 05/05/2017   Procedure: COLONOSCOPY  WITH PROPOFOL;  Surgeon: Mauri Pole, MD;  Location: [facility];  Service: Endoscopy;  Laterality: N/A;   ESOPHAGOGASTRODUODENOSCOPY (EGD) WITH PROPOFOL N/A 06/22/2018   Procedure: ESOPHAGOGASTRODUODENOSCOPY (EGD) WITH PROPOFOL;  Surgeon: Mauri Pole, MD;  Location: [facility];  Service: Endoscopy;  Laterality: N/A;   ESOPHAGOGASTRODUODENOSCOPY (EGD) WITH PROPOFOL N/A 07/28/2018   Procedure: ESOPHAGOGASTRODUODENOSCOPY (EGD) WITH PROPOFOL;  Surgeon: Mauri Pole, MD;  Location: [facility];  Service: Endoscopy;  Laterality: N/A;   NASAL SINUS SURGERY     PARTIAL HYSTERECTOMY     SINUS ENDO WITH FUSION Left 05/28/2019   Procedure: SINUS ENDO WITH FUSION;  Surgeon: [name], MD;  Location: [facility];  Service: ENT;  Laterality: Left;   TONSILLECTOMY     TUBAL LIGATION     TURBINATE REDUCTION Bilateral 05/28/2019   Procedure: TURBINATE REDUCTION;  Surgeon: [name], MD;  Location: [facility];  Service: ENT;  Laterality: Bilateral;   UVULOPALATOPHARYNGOPLASTY       Current Meds  Medication Sig   acetaminophen (TYLENOL 8 HOUR) 650 MG CR tablet Take 1 tablet (650 mg total) by mouth every 8 (eight) hours as needed for pain.   Azelastine-Fluticasone 137-50 MCG/ACT  SUSP Place 137 mcg into the nose in the morning, at noon, and at bedtime.   benzonatate (TESSALON) 100 MG capsule Take 100 mg by mouth 3 (three) times daily as needed for cough.   carvedilol (COREG) 12.5 MG tablet Take 1 tablet (12.5 mg total) by mouth 2 (two) times daily with a meal.   diclofenac (VOLTAREN) 75 MG EC tablet Take 75 mg by mouth 2 (two) times daily as needed for mild pain.   dicyclomine (BENTYL) 10 MG capsule Take 1 capsule (10 mg total) by mouth every 8 (eight) hours as needed for spasms.   EPINEPHrine 0.3 mg/0.3 mL IJ SOAJ injection Inject 0.3 mg into the muscle as needed for anaphylaxis.   ergocalciferol (VITAMIN D2) 1.25 MG (50000 UT) capsule Take 50,000 Units by mouth once a week. Monday's   FARXIGA 10 MG TABS tablet Take 10 mg by mouth daily.   fluticasone (FLONASE) 50 MCG/ACT nasal spray Place 1 spray into the nose daily.   ipratropium (ATROVENT) 0.06 % nasal spray Place 2 sprays into both nostrils 3 (three) times daily.   ipratropium-albuterol (DUONEB) 0.5-2.5 (3) MG/3ML SOLN Take 3 mLs by nebulization in the morning, at noon, in the evening, and at bedtime.   isosorbide dinitrate (ISORDIL) 20 MG tablet Take 1 tablet (20 mg total) by mouth 3 (three) times daily.   levocetirizine (XYZAL) 5 MG tablet Take 5 mg by mouth every evening.   LINZESS 290 MCG CAPS capsule TAKE 1 CAPSULE BY MOUTH DAILY BEFORE BREAKFAST. (Patient taking differently: Take 290 mcg by mouth daily.)   losartan (COZAAR) 50 MG tablet TAKE 1 TABLET BY MOUTH EVERY DAY   meclizine (ANTIVERT) 25 MG tablet Take 25 mg by mouth 2 (two) times daily as needed for dizziness.   metolazone (ZAROXOLYN) 2.5 MG tablet Take 1 tablet (2.5 mg total) by mouth daily as needed (FOR OVERNIGHT WEIGHT GAIN OF 3 lbs. or more).   montelukast (SINGULAIR) 10 MG tablet TAKE 1 TABLET(10 MG) BY MOUTH AT BEDTIME (Patient taking differently: Take 10 mg by mouth at bedtime.)   pantoprazole (PROTONIX) 40 MG tablet Take 1 tablet (40 mg total) by  mouth daily.   Potassium Chloride ER 20 MEQ TBCR TAKE 3 TABLETS BY MOUTH 3 TIMES A DAY (Patient taking differently: Take 60 mEq  by mouth in the morning, at noon, and at bedtime.)   Respiratory Therapy Supplies (FLUTTER) DEVI 1 application by Does not apply route as directed.   rosuvastatin (CRESTOR) 20 MG tablet TAKE 1 TABLET BY MOUTH EVERY DAY   spironolactone (ALDACTONE) 25 MG tablet TAKE 1/2 TABLETS BY MOUTH 2 TIMES DAILY.   topiramate (TOPAMAX) 25 MG tablet Take 25 mg by mouth 2 (two) times daily as needed (for migraines).    torsemide (DEMADEX) 20 MG tablet TAKE 2 TABLETS BY MOUTH 2 TIMES DAILY. (Patient taking differently: Take 40 mg by mouth 2 (two) times daily.)   traMADol (ULTRAM) 50 MG tablet Take 1 tablet (50 mg total) by mouth 3 (three) times daily as needed for moderate pain.   TRELEGY ELLIPTA 200-62.5-25 MCG/INH AEPB Inhale 1 puff into the lungs daily.   vitamin C (ASCORBIC ACID) 250 MG tablet Take 250 mg by mouth daily.   [DISCONTINUED] hydrALAZINE (APRESOLINE) 25 MG tablet Take 0.5 tablets (12.5 mg total) by mouth 3 (three) times daily.     Allergies:   Sulfonamide derivatives, Doxycycline, Latex, Ciprofloxacin, Fish allergy, Hydrocortisone, and Neomycin   Social History   Tobacco Use   Smoking status: Never   Smokeless tobacco: Never  Vaping Use   Vaping Use: Never used  Substance Use Topics   Alcohol use: No   Drug use: No     Family Hx: The patient's family history includes Asthma in her sister; Breast cancer in an other family member; Colon polyps in her sister; Coronary artery disease in an other family member; Heart attack in her mother; Heart disease in her mother; Hypertension in her mother. There is no history of Stroke, Colon cancer, Esophageal cancer, Rectal cancer, or Stomach cancer.  ROS:   Please see the history of present illness.    (+) LE weakness (+) L Hip pain (+) Cough with phlegm (+) Shortness of breath All other systems reviewed and are  negative.   Prior CV studies:   The following studies were reviewed today: 14-day event Monitor 11/06/16:   Quality: Fair.  Baseline artifact. Predominant rhythm: Sinus rhythm Average heart rate: 87 bpm   Patient reported palpitations at which time sinus tachycardia 120 bpm was noted PACs also noted  Lexiscan Myoview 03/2017: Nuclear stress EF: 77%. No wall motion abnormality. Increased LV wall thickness. The left ventricular ejection fraction is hyperdynamic (>65%). There was no ST segment deviation noted during stress. This is a low risk study. No ischemia identified.   Echo 03/03/17: Study Conclusions   - Left ventricle: The cavity size was normal. There was moderate   concentric hypertrophy. Systolic function was vigorous. The   estimated ejection fraction was in the range of 65% to 70%. Wall   motion was normal; there were no regional wall motion   abnormalities. Doppler parameters are consistent with abnormal   left ventricular relaxation (grade 1 diastolic dysfunction).   Doppler parameters are consistent with elevated ventricular   end-diastolic filling pressure. - Aortic valve: There was no regurgitation. - Mitral valve: There was no regurgitation. - Left atrium: The atrium was normal in size. - Right ventricle: Systolic function was normal. - Right atrium: The atrium was normal in size. - Tricuspid valve: There was no regurgitation. - Pulmonary arteries: Systolic pressure could not be accurately   estimated. - Inferior vena cava: The vessel was normal in size. - Pericardium, extracardiac: There was no pericardial effusion.   Echo 03/23/19; IMPRESSIONS     1. Left ventricular  ejection fraction, by estimation, is >75%. The left  ventricle has hyperdynamic function. The left ventricle has no regional  wall motion abnormalities. There is moderate left ventricular hypertrophy.  Left ventricular diastolic  parameters are indeterminate.   2. Right ventricular  systolic function was not well visualized. The right  ventricular size is not well visualized.   3. The mitral valve was not well visualized. No evidence of mitral valve  regurgitation.   4. The aortic valve was not well visualized. Aortic valve regurgitation  is not visualized.   5. The inferior vena cava IVC measures <1.2 cm and spontaenously  collapses, suggesting volume depletion and a low RA pressure <3 mmHg.      Labs/Other Tests and Data Reviewed:    EKG:   10/21/16 sinus tachycardia.  Rate 104 bpm.   02/18/18: Sinus rhythm.  Rate 67 bpm.  Low voltage. 02/24/19: Sinus bradycardia.  Rate 54 bpm.  Low voltage.  Cannot rule out prior septal infarct 12/01/19: Sinus bradycardia.  Rate 54 bpm.  Low voltage.  Prior septal infarct. 11/07/2020: No EKG was taken today  Recent Labs: 12/01/2019: TSH 1.860 06/07/2020: ALT 32 10/22/2020: B Natriuretic Peptide 27.1; BUN 30; Creatinine, Ser 1.19; Hemoglobin 11.3; Magnesium 2.3; Platelets 250; Potassium 4.0; Sodium 138   Recent Lipid Panel Lab Results  Component Value Date/Time   CHOL 120 05/23/2020 11:14 AM   TRIG 89 05/23/2020 11:14 AM   HDL 49 05/23/2020 11:14 AM   CHOLHDL 2.4 05/23/2020 11:14 AM   CHOLHDL 4.8 02/20/2015 04:40 AM   LDLCALC 54 05/23/2020 11:14 AM    Wt Readings from Last 3 Encounters:  11/07/20 (!) 310 lb 6.4 oz (140.8 kg)  10/22/20 290 lb (131.5 kg)  06/12/20 285 lb (129.3 kg)     Objective:    VS:  BP (!) 148/72   Pulse 63   Ht 5\' 3"  (1.6 m)   Wt (!) 310 lb 6.4 oz (140.8 kg)   SpO2 97%   BMI 54.98 kg/m  , BMI Body mass index is 54.98 kg/m. GENERAL:  Well appearing HEENT: Pupils equal round and reactive, fundi not visualized, oral mucosa unremarkable.  Poor dentition. NECK:  No jugular venous distention, waveform within normal limits, carotid upstroke brisk and symmetric, no bruits LUNGS:  Clear to auscultation bilaterally HEART:  RRR.  PMI not displaced or sustained,S1 and S2 within normal limits, no S3,  no S4, no clicks, no rubs, no murmurs ABD:  Flat, positive bowel sounds normal in frequency in pitch, no bruits, no rebound, no guarding, no midline pulsatile mass, no hepatomegaly, no splenomegaly EXT:  2 plus pulses throughout, no edema, no cyanosis no clubbing SKIN:  No rashes no nodules NEURO:  Cranial nerves II through XII grossly intact, motor grossly intact throughout PSYCH:  Cognitively intact, oriented to person place and time   ASSESSMENT & PLAN:   Chest tightness She notes chest tightness when coughing.  She also gets shortness of breath when walking.  It has been worse since she had COVID-19.  Likely pulmonary related.  She had a chest CT in2021 without coronary calcification.  We will get a coronary CT-A to assess for obstructive CAD.    Obstructive sleep apnea She has OSA and uses her BiPAP but still has daytime somnolence.  Recommend that she follow up with her sleep medicine.doctor.    Essential hypertension Blood pressure is poorly controlled today.  Her BP is elevated today.  At home she notes that it has been in  the 120-140s/60s.  We will increase hydralazine to 25mg  tid and continue carvedilol, Imdur and spironolactone.    Chronic diastolic heart failure ([facility]) She is euvolemic.  Continue carvedilol, hydralazine, Imdur, losartan, spironolactone and torsemide.     Medication Adjustments/Labs and Tests Ordered: Current medicines are reviewed at length with the patient today.  Concerns regarding medicines are outlined above.   Tests Ordered: Orders Placed This Encounter  Procedures   CT CORONARY MORPH W/CTA COR W/SCORE W/CA W/CM &/OR WO/CM   Basic metabolic panel     Medication Changes: Meds ordered this encounter  Medications   hydrALAZINE (APRESOLINE) 25 MG tablet    Sig: Take 1 tablet (25 mg total) by mouth 3 (three) times daily.    Dispense:  270 tablet    Refill:  1    NEW DOSE, D/C PREVIOUS RX     Disposition: FU with Keiko Myricks C. Oval Linsey, MD, [facility] in  3 months    I,Mykaella Javier,acting as a scribe for Skeet Latch, MD.,have documented all relevant documentation on the behalf of Skeet Latch, MD,as directed by  Skeet Latch, MD while in the presence of Skeet Latch, MD.  I, [place] Oval Linsey, MD have reviewed all documentation for this visit.  The documentation of the exam, diagnosis, procedures, and orders on 11/07/2020 are all accurate and complete.  Signed, Skeet Latch, MD  11/07/2020 1:21 PM    [facility]

## 2020-11-11 LAB — BASIC METABOLIC PANEL
BUN/Creatinine Ratio: 17 (ref 12–28)
BUN: 19 mg/dL (ref 8–27)
CO2: 23 mmol/L (ref 20–29)
Calcium: 9.6 mg/dL (ref 8.7–10.3)
Chloride: 106 mmol/L (ref 96–106)
Creatinine, Ser: 1.12 mg/dL — ABNORMAL HIGH (ref 0.57–1.00)
Glucose: 105 mg/dL — ABNORMAL HIGH (ref 70–99)
Potassium: 4.9 mmol/L (ref 3.5–5.2)
Sodium: 142 mmol/L (ref 134–144)
eGFR: 56 mL/min/{1.73_m2} — ABNORMAL LOW (ref >59)

## 2020-11-13 ENCOUNTER — Ambulatory Visit: Payer: 59

## 2020-11-15 ENCOUNTER — Ambulatory Visit: Payer: 59 | Admitting: Internal Medicine

## 2020-11-15 ENCOUNTER — Telehealth (HOSPITAL_COMMUNITY): Payer: Self-pay | Admitting: *Deleted

## 2020-11-15 NOTE — Progress Notes (Deleted)
Name: [name]  MRN/ DOB: [id number], 06/10/1960    Age/ Sex: 60 y.o., female    PCP: Bartholome Bill, MD   Reason for Endocrinology Evaluation: MNG     Date of Initial Endocrinology Evaluation: 11/15/2020     HPI: Ms. [name] is a 60 y.o. female with a past medical history of CAD, CHF , COPD and OSA. The patient presented for initial endocrinology clinic visit on 11/15/2020 for consultative assistance with her MNG.   ***  HISTORY:  Past Medical History:  Past Medical History:  Diagnosis Date   Acute on chronic diastolic CHF (congestive heart failure) ([facility]) 04/11/2014   Acute suppurative otitis media without spontaneous rupture of eardrum 02/04/2007   Centricity Description: OTITIS MEDIA, SUPPURATIVE, ACUTE, BILATERAL Qualifier: Diagnosis of  By: [name] MD, [name]  Centricity Description: OTITIS MEDIA, SUPPURATIVE, ACUTE Qualifier: Diagnosis of  By: [name] MD, Sean A    Allergic rhinitis    Allergy    Anemia    Anxiety    Arthritis    CAD (coronary artery disease)    LHC (11/29/1999):  EF 60%; no significant CAD.   Carpal tunnel syndrome    Carpal tunnel syndrome 06/09/2006   Qualifier: Diagnosis of  By: Marca Ancona RMA, Lucy     Chronic diastolic CHF (congestive heart failure) ([facility])    a. Echo (01/21/11): Vigorous LVF, EF 65-70%, no RWMA, Gr 2 DD. b.  Echo (12/15): Moderate LVH, EF 43-15%, grade 2 diastolic dysfunction, mild LAE, normal RV function c. 01/2015: echo showing EF of 60-65% with mild LVH   Common migraine    COPD (chronic obstructive pulmonary disease) (HCC)    Diastolic heart failure (HCC)    Diverticulitis    Dyspnea    GERD (gastroesophageal reflux disease)    Hepatic steatosis    Hepatomegaly    Hiatal hernia    History of cardiomegaly    Hyperlipidemia    Hypertension    IBS (irritable bowel syndrome)    Internal hemorrhoids    Long COVID    Metabolic syndrome    Morbid obesity ([facility])    OSA (obstructive sleep apnea)    CPAP  dependent   Pneumonia    PNEUMONIA, ORGAN UNSPECIFIED 03/01/2009   Qualifier: Diagnosis of  By: [name] CMA, Jennifer     Sinusitis, chronic 03/10/2012   CT sinuses 02/2012:  Chronic sinusitis with small A/F levels >> rx by ENT with prolonged augmentin F/u ct sinuses 04/2012:  Persistent sinus thickening >> rx with levaquin and clinda for 30 days.  Told to return for sinus scan in 6 weeks >> no showed for followup visit.  CT sinuses 11/2013:  Acute and chronic sinusitis noted.     Sleep apnea    bi pap   Past Surgical History:  Past Surgical History:  Procedure Laterality Date   ABDOMINAL HYSTERECTOMY     BIOPSY  06/22/2018   Procedure: BIOPSY;  Surgeon: Mauri Pole, MD;  Location: [facility];  Service: Endoscopy;;   BIOPSY  07/28/2018   Procedure: BIOPSY;  Surgeon: Mauri Pole, MD;  Location: [facility];  Service: Endoscopy;;   CESAREAN SECTION     COLONOSCOPY     COLONOSCOPY WITH PROPOFOL N/A 05/05/2017   Procedure: COLONOSCOPY WITH PROPOFOL;  Surgeon: Mauri Pole, MD;  Location: [facility];  Service: Endoscopy;  Laterality: N/A;   ESOPHAGOGASTRODUODENOSCOPY (EGD) WITH PROPOFOL N/A 06/22/2018   Procedure: ESOPHAGOGASTRODUODENOSCOPY (EGD) WITH PROPOFOL;  Surgeon: Harl Bowie  V, MD;  Location: [facility];  Service: Endoscopy;  Laterality: N/A;   ESOPHAGOGASTRODUODENOSCOPY (EGD) WITH PROPOFOL N/A 07/28/2018   Procedure: ESOPHAGOGASTRODUODENOSCOPY (EGD) WITH PROPOFOL;  Surgeon: Mauri Pole, MD;  Location: [facility];  Service: Endoscopy;  Laterality: N/A;   NASAL SINUS SURGERY     PARTIAL HYSTERECTOMY     SINUS ENDO WITH FUSION Left 05/28/2019   Procedure: SINUS ENDO WITH FUSION;  Surgeon: [name], MD;  Location: [facility];  Service: ENT;  Laterality: Left;   TONSILLECTOMY     TUBAL LIGATION     TURBINATE REDUCTION Bilateral 05/28/2019   Procedure: TURBINATE REDUCTION;  Surgeon: [name], MD;  Location: [facility];  Service: ENT;  Laterality:  Bilateral;   UVULOPALATOPHARYNGOPLASTY      Social History:  reports that she has never smoked. She has never used smokeless tobacco. She reports that she does not drink alcohol and does not use drugs. Family History: family history includes Asthma in her sister; Breast cancer in an other family member; Colon polyps in her sister; Coronary artery disease in an other family member; Heart attack in her mother; Heart disease in her mother; Hypertension in her mother.   HOME MEDICATIONS: Allergies as of 11/15/2020       Reactions   Sulfonamide Derivatives Anaphylaxis, Swelling, Rash, Other (See Comments)   Swelling tongue and ear    Doxycycline Nausea And Vomiting   Latex Hives   Ciprofloxacin Rash   Fish Allergy Rash, Other (See Comments)   Only reaction to Mackerel.  No other issues with any type of fish or shellfish currently    Hydrocortisone Rash   Neomycin Rash        Medication List        Accurate as of November 15, 2020 10:36 AM. If you have any questions, ask your nurse or doctor.          acetaminophen 650 MG CR tablet Commonly known as: Tylenol 8 Hour Take 1 tablet (650 mg total) by mouth every 8 (eight) hours as needed for pain.   Azelastine-Fluticasone 137-50 MCG/ACT Susp Place 137 mcg into the nose in the morning, at noon, and at bedtime.   benzonatate 100 MG capsule Commonly known as: TESSALON Take 100 mg by mouth 3 (three) times daily as needed for cough.   carvedilol 12.5 MG tablet Commonly known as: COREG Take 1 tablet (12.5 mg total) by mouth 2 (two) times daily with a meal.   diclofenac 75 MG EC tablet Commonly known as: VOLTAREN Take 75 mg by mouth 2 (two) times daily as needed for mild pain.   dicyclomine 10 MG capsule Commonly known as: BENTYL Take 1 capsule (10 mg total) by mouth every 8 (eight) hours as needed for spasms.   EPINEPHrine 0.3 mg/0.3 mL Soaj injection Commonly known as: EPI-PEN Inject 0.3 mg into the muscle as needed for  anaphylaxis.   ergocalciferol 1.25 MG (50000 UT) capsule Commonly known as: VITAMIN D2 Take 50,000 Units by mouth once a week. Monday's   Farxiga 10 MG Tabs tablet Generic drug: dapagliflozin propanediol Take 10 mg by mouth daily.   fluticasone 50 MCG/ACT nasal spray Commonly known as: FLONASE Place 1 spray into the nose daily.   Flutter Devi 1 application by Does not apply route as directed.   hydrALAZINE 25 MG tablet Commonly known as: APRESOLINE Take 1 tablet (25 mg total) by mouth 3 (three) times daily.   ipratropium 0.06 % nasal spray Commonly known as: ATROVENT Place  2 sprays into both nostrils 3 (three) times daily.   ipratropium-albuterol 0.5-2.5 (3) MG/3ML Soln Commonly known as: DUONEB Take 3 mLs by nebulization in the morning, at noon, in the evening, and at bedtime.   isosorbide dinitrate 20 MG tablet Commonly known as: ISORDIL Take 1 tablet (20 mg total) by mouth 3 (three) times daily.   levocetirizine 5 MG tablet Commonly known as: XYZAL Take 5 mg by mouth every evening.   Linzess 290 MCG Caps capsule Generic drug: linaclotide TAKE 1 CAPSULE BY MOUTH DAILY BEFORE BREAKFAST. What changed: See the new instructions.   losartan 50 MG tablet Commonly known as: COZAAR TAKE 1 TABLET BY MOUTH EVERY DAY   meclizine 25 MG tablet Commonly known as: ANTIVERT Take 25 mg by mouth 2 (two) times daily as needed for dizziness.   metolazone 2.5 MG tablet Commonly known as: ZAROXOLYN Take 1 tablet (2.5 mg total) by mouth daily as needed (FOR OVERNIGHT WEIGHT GAIN OF 3 lbs. or more).   montelukast 10 MG tablet Commonly known as: SINGULAIR TAKE 1 TABLET(10 MG) BY MOUTH AT BEDTIME What changed: See the new instructions.   pantoprazole 40 MG tablet Commonly known as: PROTONIX Take 1 tablet (40 mg total) by mouth daily.   Potassium Chloride ER 20 MEQ Tbcr TAKE 3 TABLETS BY MOUTH 3 TIMES A DAY What changed:  how much to take how to take this when to take  this additional instructions   rosuvastatin 20 MG tablet Commonly known as: CRESTOR TAKE 1 TABLET BY MOUTH EVERY DAY   spironolactone 25 MG tablet Commonly known as: ALDACTONE TAKE 1/2 TABLETS BY MOUTH 2 TIMES DAILY.   topiramate 25 MG tablet Commonly known as: TOPAMAX Take 25 mg by mouth 2 (two) times daily as needed (for migraines).   torsemide 20 MG tablet Commonly known as: DEMADEX TAKE 2 TABLETS BY MOUTH 2 TIMES DAILY. What changed: when to take this   traMADol 50 MG tablet Commonly known as: ULTRAM Take 1 tablet (50 mg total) by mouth 3 (three) times daily as needed for moderate pain.   Trelegy Ellipta 200-62.5-25 MCG/ACT Aepb Generic drug: Fluticasone-Umeclidin-Vilant Inhale 1 puff into the lungs daily.   vitamin C 250 MG tablet Commonly known as: ASCORBIC ACID Take 250 mg by mouth daily.          REVIEW OF SYSTEMS: A comprehensive ROS was conducted with the patient and is negative except as per HPI and below:  ROS     OBJECTIVE:  VS: There were no vitals taken for this visit.   Wt Readings from Last 3 Encounters:  11/07/20 (!) 310 lb 6.4 oz (140.8 kg)  10/22/20 290 lb (131.5 kg)  06/12/20 285 lb (129.3 kg)     EXAM: General: Pt appears well and is in NAD  Hydration: Well-hydrated with moist mucous membranes and good skin turgor  Eyes: External eye exam normal without stare, lid lag or exophthalmos.  EOM intact.  PERRL.  Ears, Nose, Throat: Hearing: Grossly intact bilaterally Dental: Good dentition  Throat: Clear without mass, erythema or exudate  Neck: General: Supple without adenopathy. Thyroid: Thyroid size normal.  No goiter or nodules appreciated. No thyroid bruit.  Lungs: Clear with good BS bilat with no rales, rhonchi, or wheezes  Heart: Auscultation: RRR.  Abdomen: Normoactive bowel sounds, soft, nontender, without masses or organomegaly palpable  Extremities: Gait and station: Normal gait  Digits and nails: No clubbing, cyanosis,  petechiae, or nodes Head and neck: Normal alignment and mobility  BL UE: Normal ROM and strength. BL LE: No pretibial edema normal ROM and strength.  Skin: Hair: Texture and amount normal with gender appropriate distribution Skin Inspection: No rashes, acanthosis nigricans/skin tags. No lipohypertrophy Skin Palpation: Skin temperature, texture, and thickness normal to palpation  Neuro: Cranial nerves: II - XII grossly intact  Cerebellar: Normal coordination and movement; no tremor Motor: Normal strength throughout DTRs: 2+ and symmetric in UE without delay in relaxation phase  Mental Status: Judgment, insight: Intact Orientation: Oriented to time, place, and person Memory: Intact for recent and remote events Mood and affect: No depression, anxiety, or agitation     DATA REVIEWED: ***    ASSESSMENT/PLAN/RECOMMENDATIONS:   MNG:    Medications :  Signed electronically by: Mack Guise, MD  [facility]  [facility] [address] [place], [place] [address] Phone: [phone] FAX: [phone]   CC: Bartholome Bill, MD [place] [address] Phone: [phone] Fax: [phone]   Return to Endocrinology clinic as below: Future Appointments  Date Time Provider [place]  11/15/2020  2:00 PM Rylie Knierim, Melanie Crazier, MD [facility] None  11/16/2020 11:00 AM [facility] 1 [facility]  11/22/2020  2:45 PM Chesley Mires, MD [facility] None  02/21/2021 11:20 AM Skeet Latch, MD [facility]

## 2020-11-15 NOTE — Telephone Encounter (Signed)
Attempted to call patient regarding upcoming cardiac CT appointment. °Left message on voicemail with name and callback number ° °Hasana Alcorta RN Navigator Cardiac Imaging °[facility] °[phone] Office °[phone] Cell ° °

## 2020-11-15 NOTE — Telephone Encounter (Signed)
Patient returning call regarding upcoming cardiac imaging study; pt verbalizes understanding of appt date/time, parking situation and where to check in, pre-test NPO status and medications ordered, and verified current allergies; name and call back number provided for further questions should they arise  Gordy Clement RN Navigator Cardiac Imaging [facility] [phone] office [phone] cell  Patient to take double her morning dose of carvedilol two hours prior to cardiac CT scan.

## 2020-11-16 ENCOUNTER — Ambulatory Visit (HOSPITAL_COMMUNITY)
Admission: RE | Admit: 2020-11-16 | Discharge: 2020-11-16 | Disposition: A | Discharge status: Home / Self Care | Payer: 59 | Source: Ambulatory Visit | Attending: Cardiovascular Disease | Admitting: Cardiovascular Disease

## 2020-11-16 ENCOUNTER — Other Ambulatory Visit: Payer: Self-pay

## 2020-11-16 DIAGNOSIS — R0789 Other chest pain: Secondary | ICD-10-CM

## 2020-11-16 DIAGNOSIS — I10 Essential (primary) hypertension: Secondary | ICD-10-CM | POA: Insufficient documentation

## 2020-11-16 MED ORDER — IOHEXOL 350 MG/ML SOLN
100.0000 mL | Freq: Once | INTRAVENOUS | Status: AC | PRN
  Administered 2020-11-16: 100 mL via INTRAVENOUS

## 2020-11-16 MED ORDER — NITROGLYCERIN 0.4 MG SL SUBL
SUBLINGUAL_TABLET | SUBLINGUAL | Status: AC
  Filled 2020-11-16: qty 2

## 2020-11-16 MED ORDER — METOPROLOL TARTRATE 5 MG/5ML IV SOLN
5.0000 mg | INTRAVENOUS | Status: DC | PRN
  Administered 2020-11-16: 5 mg via INTRAVENOUS

## 2020-11-16 MED ORDER — METOPROLOL TARTRATE 5 MG/5ML IV SOLN
INTRAVENOUS | Status: AC
  Filled 2020-11-16: qty 5

## 2020-11-16 MED ORDER — NITROGLYCERIN 0.4 MG SL SUBL
0.8000 mg | SUBLINGUAL_TABLET | Freq: Once | SUBLINGUAL | Status: AC
  Administered 2020-11-16: 0.8 mg via SUBLINGUAL

## 2020-11-16 NOTE — Progress Notes (Signed)
CT scan completed. Tolerated well. D/C home in wheelchair, awake and alert. In no distress 

## 2020-11-17 ENCOUNTER — Other Ambulatory Visit (HOSPITAL_BASED_OUTPATIENT_CLINIC_OR_DEPARTMENT_OTHER): Payer: Self-pay | Admitting: *Deleted

## 2020-11-17 MED ORDER — POTASSIUM CHLORIDE ER 20 MEQ PO TBCR: EXTENDED_RELEASE_TABLET | ORAL | 3 refills | Status: DC

## 2020-11-17 NOTE — Telephone Encounter (Signed)
Rx(s) sent to pharmacy electronically.  

## 2020-11-21 ENCOUNTER — Telehealth: Payer: Self-pay | Admitting: Gastroenterology

## 2020-11-21 NOTE — Telephone Encounter (Signed)
Inbound call from patient called to schedule recall colon at Hospital with Dr. Silverio Decamp.

## 2020-11-21 NOTE — Telephone Encounter (Signed)
Dr Silverio Decamp When do you want to schedule her for at the hospital?

## 2020-11-22 ENCOUNTER — Encounter: Payer: Self-pay | Admitting: Pulmonary Disease

## 2020-11-22 ENCOUNTER — Ambulatory Visit: Payer: 59 | Admitting: Pulmonary Disease

## 2020-11-22 ENCOUNTER — Ambulatory Visit (INDEPENDENT_AMBULATORY_CARE_PROVIDER_SITE_OTHER): Payer: 59 | Admitting: Pulmonary Disease

## 2020-11-22 ENCOUNTER — Other Ambulatory Visit: Payer: Self-pay

## 2020-11-22 VITALS — HR 77 | Ht 63.0 in | Wt 306.6 lb

## 2020-11-22 DIAGNOSIS — G4733 Obstructive sleep apnea (adult) (pediatric): Secondary | ICD-10-CM

## 2020-11-22 DIAGNOSIS — E669 Obesity, unspecified: Secondary | ICD-10-CM | POA: Diagnosis not present

## 2020-11-22 DIAGNOSIS — R5383 Other fatigue: Secondary | ICD-10-CM

## 2020-11-22 DIAGNOSIS — G4730 Sleep apnea, unspecified: Secondary | ICD-10-CM | POA: Diagnosis not present

## 2020-11-22 NOTE — Progress Notes (Signed)
[facility]  Chief Complaint  Patient presents with   Sleep Apnea    Constitutional:  Pulse 77   Ht 5\' 3"  (1.6 m)   Wt (!) 306 lb 9.6 oz (139.1 kg)   SpO2 98%   BMI 54.31 kg/m   Past Medical History:  Diastolic CHF, Allergies, Anemia, Anxiety, CAD, Carpal tunnel syndrome, Migraine headaches, COPD, Diverticulitis, GERD, Fatty liver, Hiatal hernia, HLD, HTN, IBS, COVID 19 in December 2021  Past Surgical History:  She  has a past surgical history that includes Tubal ligation; Tonsillectomy; Uvulopalatopharyngoplasty; Cesarean section; Nasal sinus surgery; Partial hysterectomy; Abdominal hysterectomy; Colonoscopy; Colonoscopy with propofol (N/A, 05/05/2017); Esophagogastroduodenoscopy (egd) with propofol (N/A, 06/22/2018); biopsy (06/22/2018); Esophagogastroduodenoscopy (egd) with propofol (N/A, 07/28/2018); biopsy (07/28/2018); Turbinate reduction (Bilateral, 05/28/2019); and Sinus endo with fusion (Left, 05/28/2019).  Brief Summary:  [name] is a 60 y.o. female with dyspnea, chronic bronchitis, and obstructive sleep apnea.      Subjective:   She is using Bipap nightly.  No issues with mask fit or pressure setting.  She has more trouble feeling tired and fatigued.  Saw cardiology.  Echo unrevealing, and cardiac CT negative for CAD.  Physical Exam:   Appearance - well kempt   ENMT - no sinus tenderness, no oral exudate, no LAN, Mallampati 3 airway, no stridor  Respiratory - equal breath sounds bilaterally, no wheezing or rales  CV - s1s2 regular rate and rhythm, no murmurs  Ext - no clubbing, no edema  Skin - no rashes  Psych - normal mood and affect    Sleep Tests:  Bipap 10/22/20 to 11/20/20 >> used on 30 of 30 nights with average 5 hrs 50 min.  Average AHI 3.5 with Bipap 13/9 cm H2o  Cardiac Tests:  Echo 03/23/19 >> EF greater than 75%, mod LVH  Social History:  She  reports that she has never smoked. She has never used  smokeless tobacco. She reports that she does not drink alcohol and does not use drugs.  Family History:  Her family history includes Asthma in her sister; Breast cancer in an other family member; Colon polyps in her sister; Coronary artery disease in an other family member; Heart attack in her mother; Heart disease in her mother; Hypertension in her mother.     Discussion:  She has persistent symptoms of fatigue and feeling tired.  Not certain this is related to her sleep apnea.  Assessment/Plan:   Obstructive sleep apnea. - she is compliant with Bipap and reports benefit from therapy - she uses [facility] for her DME - continue Bipap 13/9 cm H2O - will arrange for ONO with Bipap  Fatigue, tired. - could be related to prior COVID infection - if additional sleep assessment is unrevealing, then advised her to d/w her PCP  Obesity. - discussed importance of weight loss  COPD, history of COVID 19 infection. - she will be followed by Dr. Christinia Gully in [place] - she will be followed by Dr. Shary Key with [facility], [address] [place] [address] (phone [phone], faxe [phone])  Time Spent Involved in Patient Care on Day of Examination:  31 minutes  Follow up:   Patient Instructions  Will arrange for overnight oxygen test with Bipap  Follow up in 6 months  Medication List:   Allergies as of 11/22/2020       Reactions   Sulfonamide Derivatives Anaphylaxis, Swelling, Rash, Other (See Comments)   Swelling  tongue and ear    Doxycycline Nausea And Vomiting   Latex Hives   Ciprofloxacin Rash   Fish Allergy Rash, Other (See Comments)   Only reaction to Mackerel.  No other issues with any type of fish or shellfish currently    Hydrocortisone Rash   Neomycin Rash        Medication List        Accurate as of November 22, 2020 11:58 AM. If you have any questions, ask your nurse or doctor.          acetaminophen 650 MG CR  tablet Commonly known as: Tylenol 8 Hour Take 1 tablet (650 mg total) by mouth every 8 (eight) hours as needed for pain.   Azelastine-Fluticasone 137-50 MCG/ACT Susp Place 137 mcg into the nose in the morning, at noon, and at bedtime.   benzonatate 100 MG capsule Commonly known as: TESSALON Take 100 mg by mouth 3 (three) times daily as needed for cough.   carvedilol 12.5 MG tablet Commonly known as: COREG Take 1 tablet (12.5 mg total) by mouth 2 (two) times daily with a meal.   diclofenac 75 MG EC tablet Commonly known as: VOLTAREN Take 75 mg by mouth 2 (two) times daily as needed for mild pain.   dicyclomine 10 MG capsule Commonly known as: BENTYL Take 1 capsule (10 mg total) by mouth every 8 (eight) hours as needed for spasms.   EPINEPHrine 0.3 mg/0.3 mL Soaj injection Commonly known as: EPI-PEN Inject 0.3 mg into the muscle as needed for anaphylaxis.   ergocalciferol 1.25 MG (50000 UT) capsule Commonly known as: VITAMIN D2 Take 50,000 Units by mouth once a week. Monday's   Farxiga 10 MG Tabs tablet Generic drug: dapagliflozin propanediol Take 10 mg by mouth daily.   fluticasone 50 MCG/ACT nasal spray Commonly known as: FLONASE Place 1 spray into the nose daily.   Flutter Devi 1 application by Does not apply route as directed.   hydrALAZINE 25 MG tablet Commonly known as: APRESOLINE Take 1 tablet (25 mg total) by mouth 3 (three) times daily.   ipratropium 0.06 % nasal spray Commonly known as: ATROVENT Place 2 sprays into both nostrils 3 (three) times daily.   ipratropium-albuterol 0.5-2.5 (3) MG/3ML Soln Commonly known as: DUONEB Take 3 mLs by nebulization in the morning, at noon, in the evening, and at bedtime.   isosorbide dinitrate 20 MG tablet Commonly known as: ISORDIL Take 1 tablet (20 mg total) by mouth 3 (three) times daily.   levocetirizine 5 MG tablet Commonly known as: XYZAL Take 5 mg by mouth every evening.   Linzess 290 MCG Caps  capsule Generic drug: linaclotide TAKE 1 CAPSULE BY MOUTH DAILY BEFORE BREAKFAST. What changed: See the new instructions.   losartan 50 MG tablet Commonly known as: COZAAR TAKE 1 TABLET BY MOUTH EVERY DAY   meclizine 25 MG tablet Commonly known as: ANTIVERT Take 25 mg by mouth 2 (two) times daily as needed for dizziness.   meloxicam 15 MG tablet Commonly known as: MOBIC Take 15 mg by mouth daily.   metolazone 2.5 MG tablet Commonly known as: ZAROXOLYN Take 1 tablet (2.5 mg total) by mouth daily as needed (FOR OVERNIGHT WEIGHT GAIN OF 3 lbs. or more).   montelukast 10 MG tablet Commonly known as: SINGULAIR TAKE 1 TABLET(10 MG) BY MOUTH AT BEDTIME What changed: See the new instructions.   pantoprazole 40 MG tablet Commonly known as: PROTONIX Take 1 tablet (40 mg total) by mouth daily.  Potassium Chloride ER 20 MEQ Tbcr TAKE 3 TABLETS BY MOUTH 3 TIMES A DAY   rosuvastatin 20 MG tablet Commonly known as: CRESTOR TAKE 1 TABLET BY MOUTH EVERY DAY   spironolactone 25 MG tablet Commonly known as: ALDACTONE TAKE 1/2 TABLETS BY MOUTH 2 TIMES DAILY.   topiramate 25 MG tablet Commonly known as: TOPAMAX Take 25 mg by mouth 2 (two) times daily as needed (for migraines).   torsemide 20 MG tablet Commonly known as: DEMADEX TAKE 2 TABLETS BY MOUTH 2 TIMES DAILY. What changed: when to take this   traMADol 50 MG tablet Commonly known as: ULTRAM Take 1 tablet (50 mg total) by mouth 3 (three) times daily as needed for moderate pain.   Trelegy Ellipta 200-62.5-25 MCG/ACT Aepb Generic drug: Fluticasone-Umeclidin-Vilant Inhale 1 puff into the lungs daily.   vitamin C 250 MG tablet Commonly known as: ASCORBIC ACID Take 250 mg by mouth daily.        Signature:  Chesley Mires, MD [place] Pager - [phone] 11/22/2020, 11:58 AM

## 2020-11-22 NOTE — Patient Instructions (Signed)
Will arrange for overnight oxygen test with Bipap  Follow up in 6 months

## 2020-11-28 NOTE — Telephone Encounter (Signed)
Please schedule next available office visit with me or app prior to scheduling recall colonoscopy at the hospital.  Thank you

## 2020-11-28 NOTE — Telephone Encounter (Signed)
Called patient to schedule she will be out of town from 12/2 until 01/15 . Dr Silverio Decamp didn't have anything available but didn't realize I could put her on apps schedule  will check Apps schedule

## 2020-11-29 MED ORDER — PANTOPRAZOLE SODIUM 40 MG PO TBEC: 40.0000 mg | DELAYED_RELEASE_TABLET | Freq: Every day | ORAL | 3 refills | Status: DC

## 2020-11-29 NOTE — Telephone Encounter (Signed)
Offered her an appointment  with Ellouise Newer PA  she refused only wants to be seen only by Dr Silverio Decamp    Appointment is with Dr Silverio Decamp on 02/13/2021

## 2020-12-02 ENCOUNTER — Other Ambulatory Visit: Payer: Self-pay | Admitting: Gastroenterology

## 2020-12-02 ENCOUNTER — Other Ambulatory Visit (HOSPITAL_BASED_OUTPATIENT_CLINIC_OR_DEPARTMENT_OTHER): Payer: Self-pay | Admitting: Cardiovascular Disease

## 2020-12-04 NOTE — Telephone Encounter (Signed)
Rx(s) sent to pharmacy electronically.  

## 2020-12-09 ENCOUNTER — Other Ambulatory Visit: Payer: Self-pay | Admitting: Gastroenterology

## 2020-12-22 ENCOUNTER — Telehealth: Payer: Self-pay | Admitting: Pulmonary Disease

## 2020-12-22 NOTE — Telephone Encounter (Signed)
I called [facility] and requested ONO results be faxed to me upstairs in triage room- [phone] Awaiting fax and will then give to VS to review

## 2020-12-22 NOTE — Telephone Encounter (Signed)
ONO on BIPAP done by [facility] dated 11/30/2020 placed in Dr Juanetta Gosling lookat folder in C pod.

## 2020-12-22 NOTE — Telephone Encounter (Signed)
VS please advise on the results of the ONO.  Thanks

## 2020-12-22 NOTE — Telephone Encounter (Signed)
I have not received her overnight oximetry report.

## 2020-12-24 ENCOUNTER — Other Ambulatory Visit (HOSPITAL_BASED_OUTPATIENT_CLINIC_OR_DEPARTMENT_OTHER): Payer: Self-pay | Admitting: Cardiovascular Disease

## 2020-12-28 NOTE — Telephone Encounter (Signed)
ONO with Bipap 11/30/20 >> test time 7 hrs 12 min.  Basal SpO2 94.8%, low SpO2 84%.  Spent 2 min with SpO2 < 88%.  Please let her know her overnight oximetry with Bipap looked good.  She does not need supplemental oxygen at night and should continue using Bipap.

## 2020-12-28 NOTE — Telephone Encounter (Signed)
Called and spoke with pt letting her know the results of ONO with BIPAP per Dr. [name] and she verbalized understanding. Nothing further needed.

## 2021-01-02 ENCOUNTER — Other Ambulatory Visit (HOSPITAL_BASED_OUTPATIENT_CLINIC_OR_DEPARTMENT_OTHER): Payer: Self-pay | Admitting: Cardiovascular Disease

## 2021-01-02 NOTE — Telephone Encounter (Signed)
Rx(s) sent to pharmacy electronically.  

## 2021-01-16 ENCOUNTER — Other Ambulatory Visit (HOSPITAL_BASED_OUTPATIENT_CLINIC_OR_DEPARTMENT_OTHER): Payer: Self-pay | Admitting: Cardiovascular Disease

## 2021-01-23 ENCOUNTER — Other Ambulatory Visit (HOSPITAL_BASED_OUTPATIENT_CLINIC_OR_DEPARTMENT_OTHER): Payer: Self-pay | Admitting: Cardiovascular Disease

## 2021-01-23 ENCOUNTER — Other Ambulatory Visit: Payer: Self-pay | Admitting: Gastroenterology

## 2021-01-23 NOTE — Telephone Encounter (Signed)
Rx(s) sent to pharmacy electronically.  

## 2021-02-13 ENCOUNTER — Ambulatory Visit (INDEPENDENT_AMBULATORY_CARE_PROVIDER_SITE_OTHER): Payer: 59 | Admitting: Gastroenterology

## 2021-02-13 ENCOUNTER — Ambulatory Visit: Payer: 59 | Admitting: Gastroenterology

## 2021-02-13 ENCOUNTER — Encounter: Payer: Self-pay | Admitting: Gastroenterology

## 2021-02-13 VITALS — BP 142/80 | HR 60 | Ht 62.75 in | Wt 302.1 lb

## 2021-02-13 DIAGNOSIS — K449 Diaphragmatic hernia without obstruction or gangrene: Secondary | ICD-10-CM | POA: Diagnosis not present

## 2021-02-13 DIAGNOSIS — K219 Gastro-esophageal reflux disease without esophagitis: Secondary | ICD-10-CM

## 2021-02-13 DIAGNOSIS — K588 Other irritable bowel syndrome: Secondary | ICD-10-CM | POA: Diagnosis not present

## 2021-02-13 MED ORDER — DEXLANSOPRAZOLE 60 MG PO CPDR: 60.0000 mg | DELAYED_RELEASE_CAPSULE | Freq: Every day | ORAL | 3 refills | Status: DC

## 2021-02-13 NOTE — Progress Notes (Signed)
[name]    [id number]    01-Oct-1960  Primary Care Physician:Boyd, Dola Factor, MD  Referring Physician: Bartholome Bill, MD [name],  [name] [id number]   Chief complaint:  GERD, Hiatal hernia  HPI:  61 year old very pleasant female with history of morbid obesity, obstructive sleep apnea, COPD here for follow-up visit for GERD.  Patient had a prolonged hospitalization in [place], had to undergo rehab She is recovering well  She is currently on Protonix, her prescription was switched during hospitalization.  She is experiencing breakthrough heartburn and regurgitation.  Denies any vomiting, melena or blood per rectum.   Relevant GI history: EGD 12/09/18 - The esophagus was normal. - A large amount of a phytobezoar was found in the gastric body with no evidence of gastric outlet obstruction. Limited visualization of the gastric mucosa - The examined duodenum was normal.  EGD July 28, 2018: Few superficial linear nonbleeding gastric ulcers in the antrum and gastritis.  Benign fundic gland polyps.  Esophagus and duodenum was normal.   EGD June 22, 2018 with moderate amount of residue food in the body and fundus of stomach with limited visualization of the lining, gastritis, biopsies negative for H. pylori   Gastric emptying scan July 08, 2018: Normal   CT abdomen pelvis March 30, 2018: No acute intra-abdominal or pelvic abnormality, moderate fat-containing umbilical hernia   Colonoscopy April 2019 with removal of 11 mm hamartomatous polyp in sigmoid colon   Colonoscopy November 04, 2003 normal with no polyps   Outpatient Encounter Medications as of 02/13/2021  Medication Sig   acetaminophen (TYLENOL 8 HOUR) 650 MG CR tablet Take 1 tablet (650 mg total) by mouth every 8 (eight) hours as needed for pain.   Azelastine-Fluticasone 137-50 MCG/ACT SUSP Place 137 mcg into the nose in the morning, at noon, and at bedtime.    benzonatate (TESSALON) 100 MG capsule Take 100 mg by mouth 3 (three) times daily as needed for cough.   carvedilol (COREG) 12.5 MG tablet Take 1 tablet (12.5 mg total) by mouth 2 (two) times daily with a meal.   dicyclomine (BENTYL) 10 MG capsule Take 1 capsule (10 mg total) by mouth every 8 (eight) hours as needed for spasms.   ergocalciferol (VITAMIN D2) 1.25 MG (50000 UT) capsule Take 50,000 Units by mouth once a week. Monday's   FARXIGA 10 MG TABS tablet Take 10 mg by mouth daily.   fluticasone (FLONASE) 50 MCG/ACT nasal spray Place 1 spray into the nose daily.   hydrALAZINE (APRESOLINE) 25 MG tablet TAKE 1/2 OF A TABLET (12.5 MG TOTAL) BY MOUTH 3 TIMES A DAY   ipratropium (ATROVENT) 0.06 % nasal spray Place 2 sprays into both nostrils 3 (three) times daily.   ipratropium-albuterol (DUONEB) 0.5-2.5 (3) MG/3ML SOLN Take 3 mLs by nebulization in the morning, at noon, in the evening, and at bedtime.   isosorbide dinitrate (ISORDIL) 20 MG tablet Take 1 tablet (20 mg total) by mouth 3 (three) times daily.   levocetirizine (XYZAL) 5 MG tablet Take 5 mg by mouth every evening.   LINZESS 290 MCG CAPS capsule TAKE 1 CAPSULE BY MOUTH EVERY DAY BEFORE BREAKFAST   losartan (COZAAR) 50 MG tablet TAKE 1 TABLET BY MOUTH EVERY DAY   meclizine (ANTIVERT) 25 MG tablet Take 25 mg by mouth 2 (two) times daily as needed for dizziness.   meloxicam (MOBIC) 15 MG tablet Take  15 mg by mouth daily.   metolazone (ZAROXOLYN) 2.5 MG tablet Take 1 tablet (2.5 mg total) by mouth daily as needed (FOR OVERNIGHT WEIGHT GAIN OF 3 lbs. or more).   montelukast (SINGULAIR) 10 MG tablet TAKE 1 TABLET(10 MG) BY MOUTH AT BEDTIME (Patient taking differently: Take 10 mg by mouth at bedtime.)   pantoprazole (PROTONIX) 40 MG tablet Take 1 tablet (40 mg total) by mouth daily.   Potassium Chloride ER 20 MEQ TBCR TAKE 3 TABLETS BY MOUTH 3 TIMES A DAY   Respiratory Therapy Supplies (FLUTTER) DEVI 1 application by Does not apply route as  directed.   rosuvastatin (CRESTOR) 20 MG tablet TAKE 1 TABLET BY MOUTH EVERY DAY   spironolactone (ALDACTONE) 25 MG tablet TAKE 1/2 TABLETS BY MOUTH 2 TIMES DAILY.   topiramate (TOPAMAX) 25 MG tablet Take 25 mg by mouth 2 (two) times daily as needed (for migraines).    torsemide (DEMADEX) 20 MG tablet TAKE 2 TABLETS BY MOUTH 2 TIMES DAILY. (Patient taking differently: Take 40 mg by mouth 2 (two) times daily.)   traMADol (ULTRAM) 50 MG tablet Take 1 tablet (50 mg total) by mouth 3 (three) times daily as needed for moderate pain.   TRELEGY ELLIPTA 200-62.5-25 MCG/INH AEPB Inhale 1 puff into the lungs daily.   vitamin C (ASCORBIC ACID) 250 MG tablet Take 250 mg by mouth daily.   EPINEPHrine 0.3 mg/0.3 mL IJ SOAJ injection Inject 0.3 mg into the muscle as needed for anaphylaxis. (Patient not taking: Reported on 02/13/2021)   [DISCONTINUED] diclofenac (VOLTAREN) 75 MG EC tablet Take 75 mg by mouth 2 (two) times daily as needed for mild pain. (Patient not taking: Reported on 11/22/2020)   [DISCONTINUED] lubiprostone (AMITIZA) 24 MCG capsule TAKE 1 CAPSULE BY MOUTH TWICE A DAY WITH MEALS   No facility-administered encounter medications on file as of 02/13/2021.    Allergies as of 02/13/2021 - Review Complete 02/13/2021  Allergen Reaction Noted   Sulfonamide derivatives Anaphylaxis, Swelling, Rash, and Other (See Comments) 10/21/2006   Doxycycline Nausea And Vomiting 01/05/2008   Latex Hives 09/28/2011   Ciprofloxacin Rash 10/21/2006   Fish allergy Rash and Other (See Comments) 03/08/2012   Hydrocortisone Rash    Neomycin Rash 10/21/2006    Past Medical History:  Diagnosis Date   Acute on chronic diastolic CHF (congestive heart failure) ([facility]) 04/11/2014   Acute suppurative otitis media without spontaneous rupture of eardrum 02/04/2007   Centricity Description: OTITIS MEDIA, SUPPURATIVE, ACUTE, BILATERAL Qualifier: Diagnosis of  By: [name] MD, [name]  Centricity Description: OTITIS MEDIA,  SUPPURATIVE, ACUTE Qualifier: Diagnosis of  By: [name] MD, Sean A    Allergic rhinitis    Allergy    Anemia    Anxiety    Arthritis    CAD (coronary artery disease)    LHC (11/29/1999):  EF 60%; no significant CAD.   Carpal tunnel syndrome    Carpal tunnel syndrome 06/09/2006   Qualifier: Diagnosis of  By: Marca Ancona RMA, Lucy     Chronic diastolic CHF (congestive heart failure) ([facility])    a. Echo (01/21/11): Vigorous LVF, EF 65-70%, no RWMA, Gr 2 DD. b.  Echo (12/15): Moderate LVH, EF 33-29%, grade 2 diastolic dysfunction, mild LAE, normal RV function c. 01/2015: echo showing EF of 60-65% with mild LVH   Common migraine    COPD (chronic obstructive pulmonary disease) ([facility])    [id number]    x 2   Diastolic heart failure ([facility])    Diverticulitis  Dyspnea    GERD (gastroesophageal reflux disease)    Hepatic steatosis    Hepatomegaly    Hiatal hernia    History of cardiomegaly    Hyperlipidemia    Hypertension    IBS (irritable bowel syndrome)    Internal hemorrhoids    Long COVID    Metabolic syndrome    Morbid obesity ([facility])    OSA (obstructive sleep apnea)    CPAP dependent   PNEUMONIA, ORGAN UNSPECIFIED 03/01/2009   Qualifier: Diagnosis of  By: [name] CMA, Jennifer     Sinusitis, chronic 03/10/2012   CT sinuses 02/2012:  Chronic sinusitis with small A/F levels >> rx by ENT with prolonged augmentin F/u ct sinuses 04/2012:  Persistent sinus thickening >> rx with levaquin and clinda for 30 days.  Told to return for sinus scan in 6 weeks >> no showed for followup visit.  CT sinuses 11/2013:  Acute and chronic sinusitis noted.     Sleep apnea    bi pap    Past Surgical History:  Procedure Laterality Date   ABDOMINAL HYSTERECTOMY     BIOPSY  06/22/2018   Procedure: BIOPSY;  Surgeon: Mauri Pole, MD;  Location: [facility];  Service: Endoscopy;;   BIOPSY  07/28/2018   Procedure: BIOPSY;  Surgeon: Mauri Pole, MD;  Location: [facility];  Service: Endoscopy;;   CESAREAN  SECTION     COLONOSCOPY     COLONOSCOPY WITH PROPOFOL N/A 05/05/2017   Procedure: COLONOSCOPY WITH PROPOFOL;  Surgeon: Mauri Pole, MD;  Location: [facility];  Service: Endoscopy;  Laterality: N/A;   ESOPHAGOGASTRODUODENOSCOPY (EGD) WITH PROPOFOL N/A 06/22/2018   Procedure: ESOPHAGOGASTRODUODENOSCOPY (EGD) WITH PROPOFOL;  Surgeon: Mauri Pole, MD;  Location: [facility];  Service: Endoscopy;  Laterality: N/A;   ESOPHAGOGASTRODUODENOSCOPY (EGD) WITH PROPOFOL N/A 07/28/2018   Procedure: ESOPHAGOGASTRODUODENOSCOPY (EGD) WITH PROPOFOL;  Surgeon: Mauri Pole, MD;  Location: [facility];  Service: Endoscopy;  Laterality: N/A;   NASAL SINUS SURGERY     PARTIAL HYSTERECTOMY     SINUS ENDO WITH FUSION Left 05/28/2019   Procedure: SINUS ENDO WITH FUSION;  Surgeon: [name], MD;  Location: [facility];  Service: ENT;  Laterality: Left;   TONSILLECTOMY     TUBAL LIGATION     TURBINATE REDUCTION Bilateral 05/28/2019   Procedure: TURBINATE REDUCTION;  Surgeon: [name], MD;  Location: [facility];  Service: ENT;  Laterality: Bilateral;   UVULOPALATOPHARYNGOPLASTY      Family History  Problem Relation Age of Onset   Heart disease Mother    Heart attack Mother    Hypertension Mother    Coronary artery disease Other        1st degree relatvie <50   Breast cancer Other        aunt- ? paternal or maternal   Asthma Sister    Colon polyps Sister    Stroke Neg Hx    Colon cancer Neg Hx    Esophageal cancer Neg Hx    Rectal cancer Neg Hx    Stomach cancer Neg Hx     Social History   Socioeconomic History   Marital status: Widowed    Spouse name: Not on file   Number of children: 2   Years of education: Not on file   Highest education level: Not on file  Occupational History   Occupation: Retired  Tobacco Use   Smoking status: Never   Smokeless tobacco: Never  Vaping Use   Vaping Use: Never used  Substance and Sexual Activity   Alcohol use: No   Drug use: No    Sexual activity: Not on file  Other Topics Concern   Not on file  Social History Narrative   Full-time student, Oceanographer.   Widowed, lives with son.   Social Determinants of Health   Financial Resource Strain: Not on file  Food Insecurity: Not on file  Transportation Needs: Not on file  Physical Activity: Not on file  Stress: Not on file  Social Connections: Not on file  Intimate Partner Violence: Not on file      Review of systems: All other review of systems negative except as mentioned in the HPI.   Physical Exam: Vitals:   02/13/21 1057  BP: (!) 142/80  Pulse: 60   Body mass index is 53.95 kg/m. Gen:      No acute distress HEENT:  sclera anicteric Abd:      soft, non-tender; no palpable masses, no distension Ext:    No edema Neuro: alert and oriented x 3 Psych: normal mood and affect  Data Reviewed:  Reviewed labs, radiology imaging, old records and pertinent past GI work up   Assessment and Plan/Recommendations:  61 year old very pleasant female with morbid obesity, hypertension, hyperlipidemia, COPD, OSA, diastolic dysfunction, recent Covid 19, pulmonary infiltrates treated with multiple antibiotics and prolonged hospitalization with complaints of worsening reflux symptoms  Switch to Dexilant 60 mg daily  Continue sucralfate before meals three times daily and at bedtime as needed Reflux measures  We will hold off endoscopic procedures for now given patient is recovering from recent hospitalization Return in 3 to 4 months or sooner if needed   The patient was provided an opportunity to ask questions and all were answered. The patient agreed with the plan and demonstrated an understanding of the instructions.  Damaris Hippo , MD    CC: Bartholome Bill, MD

## 2021-02-13 NOTE — Patient Instructions (Signed)
STOP Protonix  We will send in [place] to your pharmacy, add Pepcid 20 mg in the evening  Use Dicyclomine only as needed  Follow up in 4 months  Gastroesophageal Reflux Disease, Adult Gastroesophageal reflux (GER) happens when acid from the stomach flows up into the tube that connects the mouth and the stomach (esophagus). Normally, food travels down the esophagus and stays in the stomach to be digested. However, when a person has GER, food and stomach acid sometimes move back up into the esophagus. If this becomes a more serious problem, the person may be diagnosed with a disease called gastroesophageal reflux disease (GERD). GERD occurs when the reflux: Happens often. Causes frequent or severe symptoms. Causes problems such as damage to the esophagus. When stomach acid comes in contact with the esophagus, the acid may cause inflammation in the esophagus. Over time, GERD may create small holes (ulcers) in the lining of the esophagus. What are the causes? This condition is caused by a problem with the muscle between the esophagus and the stomach (lower esophageal sphincter, or LES). Normally, the LES muscle closes after food passes through the esophagus to the stomach. When the LES is weakened or abnormal, it does not close properly, and that allows food and stomach acid to go back up into the esophagus. The LES can be weakened by certain dietary substances, medicines, and medical conditions, including: Tobacco use. Pregnancy. Having a hiatal hernia. Alcohol use. Certain foods and beverages, such as coffee, chocolate, onions, and peppermint. What increases the risk? You are more likely to develop this condition if you: Have an increased body weight. Have a connective tissue disorder. Take NSAIDs, such as ibuprofen. What are the signs or symptoms? Symptoms of this condition include: Heartburn. Difficult or painful swallowing and the feeling of having a lump in the throat. A bitter  taste in the mouth. Bad breath and having a large amount of saliva. Having an upset or bloated stomach and belching. Chest pain. Different conditions can cause chest pain. Make sure you see your health care provider if you experience chest pain. Shortness of breath or wheezing. Ongoing (chronic) cough or a nighttime cough. Wearing away of tooth enamel. Weight loss. How is this diagnosed? This condition may be diagnosed based on a medical history and a physical exam. To determine if you have mild or severe GERD, your health care provider may also monitor how you respond to treatment. You may also have tests, including: A test to examine your stomach and esophagus with a small camera (endoscopy). A test that measures the acidity level in your esophagus. A test that measures how much pressure is on your esophagus. A barium swallow or modified barium swallow test to show the shape, size, and functioning of your esophagus. How is this treated? Treatment for this condition may vary depending on how severe your symptoms are. Your health care provider may recommend: Changes to your diet. Medicine. Surgery. The goal of treatment is to help relieve your symptoms and to prevent complications. Follow these instructions at home: Eating and drinking  Follow a diet as recommended by your health care provider. This may involve avoiding foods and drinks such as: Coffee and tea, with or without caffeine. Drinks that contain alcohol. Energy drinks and sports drinks. Carbonated drinks or sodas. Chocolate and cocoa. Peppermint and mint flavorings. Garlic and onions. Horseradish. Spicy and acidic foods, including peppers, chili powder, curry powder, vinegar, hot sauces, and barbecue sauce. Citrus fruit juices and citrus fruits, such as  oranges, lemons, and limes. Tomato-based foods, such as red sauce, chili, salsa, and pizza with red sauce. Fried and fatty foods, such as donuts, french fries, potato  chips, and high-fat dressings. High-fat meats, such as hot dogs and fatty cuts of red and white meats, such as rib eye steak, sausage, ham, and bacon. High-fat dairy items, such as whole milk, butter, and cream cheese. Eat small, frequent meals instead of large meals. Avoid drinking large amounts of liquid with your meals. Avoid eating meals during the 2-3 hours before bedtime. Avoid lying down right after you eat. Do not exercise right after you eat. Lifestyle  Do not use any products that contain nicotine or tobacco. These products include cigarettes, chewing tobacco, and vaping devices, such as e-cigarettes. If you need help quitting, ask your health care provider. Try to reduce your stress by using methods such as yoga or meditation. If you need help reducing stress, ask your health care provider. If you are overweight, reduce your weight to an amount that is healthy for you. Ask your health care provider for guidance about a safe weight loss goal. General instructions Pay attention to any changes in your symptoms. Take over-the-counter and prescription medicines only as told by your health care provider. Do not take aspirin, ibuprofen, or other NSAIDs unless your health care provider told you to take these medicines. Wear loose-fitting clothing. Do not wear anything tight around your waist that causes pressure on your abdomen. Raise (elevate) the head of your bed about 6 inches (15 cm). You can use a wedge to do this. Avoid bending over if this makes your symptoms worse. Keep all follow-up visits. This is important. Contact a health care provider if: You have: New symptoms. Unexplained weight loss. Difficulty swallowing or it hurts to swallow. Wheezing or a persistent cough. A hoarse voice. Your symptoms do not improve with treatment. Get help right away if: You have sudden pain in your arms, neck, jaw, teeth, or back. You suddenly feel sweaty, dizzy, or light-headed. You have  chest pain or shortness of breath. You vomit and the vomit is green, yellow, or black, or it looks like blood or coffee grounds. You faint. You have stool that is red, bloody, or black. You cannot swallow, drink, or eat. These symptoms may represent a serious problem that is an emergency. Do not wait to see if the symptoms will go away. Get medical help right away. Call your local emergency services (911 in the [place] Do not drive yourself to the hospital. Summary Gastroesophageal reflux happens when acid from the stomach flows up into the esophagus. GERD is a disease in which the reflux happens often, causes frequent or severe symptoms, or causes problems such as damage to the esophagus. Treatment for this condition may vary depending on how severe your symptoms are. Your health care provider may recommend diet and lifestyle changes, medicine, or surgery. Contact a health care provider if you have new or worsening symptoms. Take over-the-counter and prescription medicines only as told by your health care provider. Do not take aspirin, ibuprofen, or other NSAIDs unless your health care provider told you to do so. Keep all follow-up visits as told by your health care provider. This is important. This information is not intended to replace advice given to you by your health care provider. Make sure you discuss any questions you have with your health care provider. Document Revised: 07/19/2019 Document Reviewed: 07/19/2019 Elsevier Patient Education  2022 [employer].  If you are  age 10 or older, your body mass index should be between 23-30. Your Body mass index is 29.99 kg/m. If this is out of the aforementioned range listed, please consider follow up with your Primary Care Provider.  If you are age 59 or younger, your body mass index should be between 19-25. Your Body mass index is 29.99 kg/m. If this is out of the aformentioned range listed, please consider follow up with your Primary Care  Provider.   ________________________________________________________  The [facility] providers would like to encourage you to use [facility] to communicate with providers for non-urgent requests or questions.  Due to long hold times on the telephone, sending your provider a message by [facility] may be a faster and more efficient way to get a response.  Please allow 48 business hours for a response.  Please remember that this is for non-urgent requests.  _______________________________________________________   I appreciate the  opportunity to care for you  Thank You   Harl Bowie , MD

## 2021-02-19 NOTE — Progress Notes (Signed)
Cardiology Office Note   Date:  02/21/2021   ID:  [name], DOB 1960-07-16, MRN [id number]  PCP:  Bartholome Bill, MD  Cardiologist:  Skeet Latch, MD  Electrophysiologist:  None   Evaluation Performed:  Follow-Up Visit  Chief Complaint:  Shortness of breath  History of Present Illness:    [name] is a 61 y.o. female with chronic diastolic heart failure, hypertension, hyperlipidemia, aortic atherosclerosis, OSA on BiPAP, morbid obesity, and COPD who presents for follow up.  [name] was previously a patient of Dr. Percival Spanish.  She last saw him 02/2016 and was doing well.  June 2018 she developed hypotension and worsened renal function so her diuretics were held. She developed acute shortness of breath.  BNP was 31 and cardiac enzymes were negative.  She was diuresed with IV lasix. She followed up with her nephrologist and was started on torsemide 40mg  bid.  At her last appointment she reported palpitations.  She wore a 14-day event monitor 10/2016 that revealed an average heart rate of 87 bpm but episodes of palpitations at which time she was noted to be in sinus tachycardia at 120 bpm.  She also had PACs. She was treated for bronchitis while visiting [place] 01/2017.  Her breathing has improved somewhat since then but she continues to struggle.  Carvedilol was added due to poorly controlled blood pressure.  She also reported exertional dyspnea.  She was referred for an echocardiogram 03/03/2017 that revealed LVEF 65 to 70% with grade 1 diastolic dysfunction.  She also had a Lexiscan Myoview 03/2017 that revealed LVEF 77% and no ischemia.  She called our office with lightheadedness and dizziness.  Her blood pressure at the time was 121/56.  Hydralazine was decreased to 25 mg 3 times daily.  She followed up with Jory Sims, DNP, on 03/2017.  At that time her blood pressure was well-controlled.  She continued to report dizziness despite lack of orthostasis on exam.  She  recently returned from [place].  While there she had two flares of bronchitis while there.  She was treated with doxycycline and prednisone.  At her last appointment her blood pressure was running low when she had not yet taken her medication.  Therefore hydrochlorothiazide was reduced to 12.5 mg.  Since her last appointment she was diagnosed with COVID 09/2018.  She was in [place] and caught it from family members.  She was treated at home with antibiotics and inhalers and did not require hospitalization.  She still chest mucous and chest discomfort.  Her chest discomfort occurs when lying down.  It feels like a sharp apin that is better with deep breaths.  She has no exertional chest pain.  She also struggles with pain and swelling in her R LE.  She has chronic knee pain and is due to have an injection.  However this pain has been worse than usual.  She recently traveled from [place] by car.  She is unsure what her blood pressure has been running)  [name] had lower extremity Dopplers 02/2019 that were negative for DVT.  She noted shortness of breath.  She had a repeat echocardiogram that revealed LVEF greater than 75% with moderate LVH.  Diastolic function was indeterminate.  IVC was less than 1.2 cm and spontaneously collapsed, suggestive of volume depletion.  She was instructed to not use metolazone and use diuretics sparingly.   Last visit, she had chest tightness when coughing. She also reported exertional dyspnea. Hydralazine  was increased to 25mg . She had a coronary CT-A 10/2020 which showed normal coronary arteries and a calcium score of 0. She did have aortic atherosclerosis and a small hiatal hernia.   Today, she is doing well. Since she was infected with COVID twice, she has being feeling different. She has not been getting quality sleep recently and feeling fatigued. She will stay up at night with a productive cough. Due to lack of sleep, she feels fatigued throughout the day. She reports she  could fall asleep after sitting for too long. The fatigue has also affected her appetite. Rather than eating a meal, she has been drinking protein drinks. She endorses acid reflux. Her pulmonologist, Dr. [name], told her she was getting good oxygen and the symptoms could be related to cardiovascular issues. He advised her to take Mucinex and only take prednisone for emergencies. She takes 4 breathing treatments daily and Trelegy. She believes these treatments and the steroid treatments from COVID have caused her weight to fluctuate. Last Tuesday, she had a fall because her R knee buckled. She has been told she is not a candidate for knee replacement because of her weight and she has no mobility in that knee. She undergoes physical therapy to build strength and mobility in her knee. Her R knee occasionally swells. She does use her BiPAP. She denies any palpitations, lightheadedness, headaches, syncope, orthopnea, or PND.  Past Medical History:  Diagnosis Date   Acute on chronic diastolic CHF (congestive heart failure) ([place]) 04/11/2014   Acute suppurative otitis media without spontaneous rupture of eardrum 02/04/2007   Centricity Description: OTITIS MEDIA, SUPPURATIVE, ACUTE, BILATERAL Qualifier: Diagnosis of  By: [name] MD, [name]  Centricity Description: OTITIS MEDIA, SUPPURATIVE, ACUTE Qualifier: Diagnosis of  By: [name] MD, Sean A    Allergic rhinitis    Allergy    Anemia    Anxiety    Arthritis    Atherosclerosis of aorta ([place]) 02/21/2021   CAD (coronary artery disease)    LHC (11/29/1999):  EF 60%; no significant CAD.   Carpal tunnel syndrome    Carpal tunnel syndrome 06/09/2006   Qualifier: Diagnosis of  By: Marca Ancona RMA, Lucy     Chronic diastolic CHF (congestive heart failure) ([place])    a. Echo (01/21/11): Vigorous LVF, EF 65-70%, no RWMA, Gr 2 DD. b.  Echo (12/15): Moderate LVH, EF 58-09%, grade 2 diastolic dysfunction, mild LAE, normal RV function c. 01/2015: echo showing EF of 60-65% with mild  LVH   Common migraine    COPD (chronic obstructive pulmonary disease) ([facility])    [id number]    x 2   Diastolic heart failure ([facility])    Diverticulitis    Dyspnea    GERD (gastroesophageal reflux disease)    Hepatic steatosis    Hepatomegaly    Hiatal hernia    History of cardiomegaly    Hyperlipidemia    Hypertension    IBS (irritable bowel syndrome)    Internal hemorrhoids    Long COVID    Metabolic syndrome    Morbid obesity ([place])    OSA (obstructive sleep apnea)    CPAP dependent   PNEUMONIA, ORGAN UNSPECIFIED 03/01/2009   Qualifier: Diagnosis of  By: [name] CMA, Jennifer     Sinusitis, chronic 03/10/2012   CT sinuses 02/2012:  Chronic sinusitis with small A/F levels >> rx by ENT with prolonged augmentin F/u ct sinuses 04/2012:  Persistent sinus thickening >> rx with levaquin and clinda for 30 days.  Told to return  for sinus scan in 6 weeks >> no showed for followup visit.  CT sinuses 11/2013:  Acute and chronic sinusitis noted.     Sleep apnea    bi pap   Past Surgical History:  Procedure Laterality Date   ABDOMINAL HYSTERECTOMY     BIOPSY  06/22/2018   Procedure: BIOPSY;  Surgeon: Mauri Pole, MD;  Location: [facility];  Service: Endoscopy;;   BIOPSY  07/28/2018   Procedure: BIOPSY;  Surgeon: Mauri Pole, MD;  Location: [facility];  Service: Endoscopy;;   CESAREAN SECTION     COLONOSCOPY     COLONOSCOPY WITH PROPOFOL N/A 05/05/2017   Procedure: COLONOSCOPY WITH PROPOFOL;  Surgeon: Mauri Pole, MD;  Location: [facility];  Service: Endoscopy;  Laterality: N/A;   ESOPHAGOGASTRODUODENOSCOPY (EGD) WITH PROPOFOL N/A 06/22/2018   Procedure: ESOPHAGOGASTRODUODENOSCOPY (EGD) WITH PROPOFOL;  Surgeon: Mauri Pole, MD;  [facility]: [facility];  Service: Endoscopy;  Laterality: N/A;   ESOPHAGOGASTRODUODENOSCOPY (EGD) WITH PROPOFOL N/A 07/28/2018   Procedure: ESOPHAGOGASTRODUODENOSCOPY (EGD) WITH PROPOFOL;  Surgeon: Mauri Pole, MD;  [facility]: [facility];  Service: Endoscopy;  Laterality: N/A;   NASAL SINUS SURGERY     PARTIAL HYSTERECTOMY     SINUS ENDO WITH FUSION Left 05/28/2019   Procedure: SINUS ENDO WITH FUSION;  Surgeon: [name], MD;  Location: [facility];  Service: ENT;  Laterality: Left;   TONSILLECTOMY     TUBAL LIGATION     TURBINATE REDUCTION Bilateral 05/28/2019   Procedure: TURBINATE REDUCTION;  Surgeon: [name], MD;  Location: [facility];  Service: ENT;  Laterality: Bilateral;   UVULOPALATOPHARYNGOPLASTY       Current Meds  Medication Sig   acetaminophen (TYLENOL 8 HOUR) 650 MG CR tablet Take 1 tablet (650 mg total) by mouth every 8 (eight) hours as needed for pain.   Azelastine-Fluticasone 137-50 MCG/ACT SUSP Place 137 mcg into the nose in the morning, at noon, and at bedtime.   benzonatate (TESSALON) 100 MG capsule Take 100 mg by mouth 3 (three) times daily as needed for cough.   carvedilol (COREG) 12.5 MG tablet Take 1 tablet (12.5 mg total) by mouth 2 (two) times daily with a meal.   dexlansoprazole (DEXILANT) 60 MG capsule Take 1 capsule (60 mg total) by mouth daily. Daily in the morning   dicyclomine (BENTYL) 10 MG capsule Take 1 capsule (10 mg total) by mouth every 8 (eight) hours as needed for spasms.   EPINEPHrine 0.3 mg/0.3 mL IJ SOAJ injection Inject 0.3 mg into the muscle as needed for anaphylaxis.   ergocalciferol (VITAMIN D2) 1.25 MG (50000 UT) capsule Take 50,000 Units by mouth once a week. Monday's   FARXIGA 10 MG TABS tablet Take 10 mg by mouth daily.   fluticasone (FLONASE) 50 MCG/ACT nasal spray Place 1 spray into the nose daily.   hydrALAZINE (APRESOLINE) 25 MG tablet Take 25 mg by mouth 3 (three) times daily.   ipratropium (ATROVENT) 0.06 % nasal spray Place 2 sprays into both nostrils 3 (three) times daily.   ipratropium-albuterol (DUONEB) 0.5-2.5 (3) MG/3ML SOLN Take 3 mLs by nebulization in the morning, at noon, in the evening, and at bedtime.   isosorbide dinitrate (ISORDIL) 20 MG  tablet Take 1 tablet (20 mg total) by mouth 3 (three) times daily.   levocetirizine (XYZAL) 5 MG tablet Take 5 mg by mouth every evening.   LINZESS 290 MCG CAPS capsule TAKE 1 CAPSULE BY MOUTH EVERY DAY BEFORE BREAKFAST   losartan (COZAAR) 50  MG tablet TAKE 1 TABLET BY MOUTH EVERY DAY   meclizine (ANTIVERT) 25 MG tablet Take 25 mg by mouth 2 (two) times daily as needed for dizziness.   meloxicam (MOBIC) 15 MG tablet Take 15 mg by mouth daily.   metolazone (ZAROXOLYN) 2.5 MG tablet Take 1 tablet (2.5 mg total) by mouth daily as needed (FOR OVERNIGHT WEIGHT GAIN OF 3 lbs. or more).   montelukast (SINGULAIR) 10 MG tablet TAKE 1 TABLET(10 MG) BY MOUTH AT BEDTIME (Patient taking differently: Take 10 mg by mouth at bedtime.)   Potassium Chloride ER 20 MEQ TBCR TAKE 3 TABLETS BY MOUTH 3 TIMES A DAY   Respiratory Therapy Supplies (FLUTTER) DEVI 1 application by Does not apply route as directed.   rosuvastatin (CRESTOR) 20 MG tablet TAKE 1 TABLET BY MOUTH EVERY DAY   Semaglutide, 1 MG/DOSE, (OZEMPIC, 1 MG/DOSE,) 4 MG/3ML SOPN AFTER YOU FINISH FIRST 8 WEEKS INJECT 1 MG WEEKLY FOR 4 WEEKS THEN INCREASE TO 2 MG WEEKLY   Semaglutide,0.25 or 0.5MG /DOS, (OZEMPIC, 0.25 OR 0.5 MG/DOSE,) 2 MG/1.5ML SOPN INJECT 0.25 MG WEEKLY FOR 4 WEEKS THEN INCREASE TO 0.5 MG WEEKLY FOR 4 WEEKS   spironolactone (ALDACTONE) 25 MG tablet TAKE 1/2 TABLETS BY MOUTH 2 TIMES DAILY.   topiramate (TOPAMAX) 25 MG tablet Take 25 mg by mouth 2 (two) times daily as needed (for migraines).    torsemide (DEMADEX) 20 MG tablet TAKE 2 TABLETS BY MOUTH 2 TIMES DAILY. (Patient taking differently: Take 40 mg by mouth 2 (two) times daily.)   traMADol (ULTRAM) 50 MG tablet Take 1 tablet (50 mg total) by mouth 3 (three) times daily as needed for moderate pain.   TRELEGY ELLIPTA 200-62.5-25 MCG/INH AEPB Inhale 1 puff into the lungs daily.   vitamin C (ASCORBIC ACID) 250 MG tablet Take 250 mg by mouth daily.   [DISCONTINUED] hydrALAZINE (APRESOLINE) 25  MG tablet TAKE 1/2 OF A TABLET (12.5 MG TOTAL) BY MOUTH 3 TIMES A DAY     Allergies:   Sulfonamide derivatives, Doxycycline, Latex, Ciprofloxacin, Fish allergy, Hydrocortisone, and Neomycin   Social History   Tobacco Use   Smoking status: Never   Smokeless tobacco: Never  Vaping Use   Vaping Use: Never used  Substance Use Topics   Alcohol use: No   Drug use: No     Family Hx: The patient's family history includes Asthma in her sister; Breast cancer in an other family member; Colon polyps in her sister; Coronary artery disease in an other family member; Heart attack in her mother; Heart disease in her mother; Hypertension in her mother. There is no history of Stroke, Colon cancer, Esophageal cancer, Rectal cancer, or Stomach cancer.  ROS:   Please see the history of present illness.    (+) Insomnia (+) Productive cough (+) Malaise (+) Poor appetite (+) Weight fluctuations (+) Fall (+) Limited mobility (R knee) (+) RLE edema (R knee) All other systems reviewed and are negative.  Prior CV studies:   The following studies were reviewed today: CT Coronary 11/16/20 IMPRESSION: 1. Coronary calcium score of 0.   2. Normal coronary origin with right dominance.   3. Normal coronary arteries.   RECOMMENDATIONS: 1. No evidence of CAD (0%). Consider non-atherosclerotic causes of chest pain.  Echo 03/23/19  1. Left ventricular ejection fraction, by estimation, is >75%. The left  ventricle has hyperdynamic function. The left ventricle has no regional  wall motion abnormalities. There is moderate left ventricular hypertrophy.  Left ventricular diastolic  parameters are indeterminate.   2. Right ventricular systolic function was not well visualized. The right  ventricular size is not well visualized.   3. The mitral valve was not well visualized. No evidence of mitral valve  regurgitation.   4. The aortic valve was not well visualized. Aortic valve regurgitation  is not  visualized.   5. The inferior vena cava IVC measures <1.2 cm and spontaenously  collapses, suggesting volume depletion and a low RA pressure <3 mmHg.   Lower Venous DVT 02/24/19 RIGHT:  - No evidence of deep vein thrombosis in the lower extremity. No indirect  evidence of obstruction proximal to the inguinal ligament.  - No cystic structure found in the popliteal fossa.     LEFT:  - No evidence of common femoral vein obstruction.  Myoview 04/02/17 Nuclear stress EF: 77%. No wall motion abnormality. Increased LV wall thickness. The left ventricular ejection fraction is hyperdynamic (>65%). There was no ST segment deviation noted during stress. This is a low risk study. No ischemia identified.  14-day event Monitor 11/06/16:   Quality: Fair.  Baseline artifact. Predominant rhythm: Sinus rhythm Average heart rate: 87 bpm   Patient reported palpitations at which time sinus tachycardia 120 bpm was noted PACs also noted  Lexiscan Myoview 03/2017: Nuclear stress EF: 77%. No wall motion abnormality. Increased LV wall thickness. The left ventricular ejection fraction is hyperdynamic (>65%). There was no ST segment deviation noted during stress. This is a low risk study. No ischemia identified.   Echo 03/03/17: Study Conclusions - Left ventricle: The cavity size was normal. There was moderate   concentric hypertrophy. Systolic function was vigorous. The   estimated ejection fraction was in the range of 65% to 70%. Wall   motion was normal; there were no regional wall motion   abnormalities. Doppler parameters are consistent with abnormal   left ventricular relaxation (grade 1 diastolic dysfunction).   Doppler parameters are consistent with elevated ventricular   end-diastolic filling pressure. - Aortic valve: There was no regurgitation. - Mitral valve: There was no regurgitation. - Left atrium: The atrium was normal in size. - Right ventricle: Systolic function was normal. - Right  atrium: The atrium was normal in size. - Tricuspid valve: There was no regurgitation. - Pulmonary arteries: Systolic pressure could not be accurately   estimated. - Inferior vena cava: The vessel was normal in size. - Pericardium, extracardiac: There was no pericardial effusion.   Echo 03/23/19; IMPRESSIONS:  1. Left ventricular ejection fraction, by estimation, is >75%. The left  ventricle has hyperdynamic function. The left ventricle has no regional  wall motion abnormalities. There is moderate left ventricular hypertrophy.  Left ventricular diastolic  parameters are indeterminate.   2. Right ventricular systolic function was not well visualized. The right  ventricular size is not well visualized.   3. The mitral valve was not well visualized. No evidence of mitral valve  regurgitation.   4. The aortic valve was not well visualized. Aortic valve regurgitation  is not visualized.   5. The inferior vena cava IVC measures <1.2 cm and spontaenously  collapses, suggesting volume depletion and a low RA pressure <3 mmHg.    Labs/Other Tests and Data Reviewed:    EKG:  EKG was not ordered today 12/01/19: Sinus bradycardia.  Rate 54 bpm.  Low voltage.  Prior septal infarct. 02/24/19: Sinus bradycardia.  Rate 54 bpm.  Low voltage.  Cannot rule out prior septal infarct 02/18/18: Sinus rhythm.  Rate 67 bpm.  Low voltage. 10/21/16 sinus tachycardia.  Rate 104 bpm.    Recent Labs: 06/07/2020: ALT 32 10/22/2020: B Natriuretic Peptide 27.1; Hemoglobin 11.3; Magnesium 2.3; Platelets 250 11/10/2020: BUN 19; Creatinine, Ser 1.12; Potassium 4.9; Sodium 142   Recent Lipid Panel Lab Results  Component Value Date/Time   CHOL 120 05/23/2020 11:14 AM   TRIG 89 05/23/2020 11:14 AM   HDL 49 05/23/2020 11:14 AM   CHOLHDL 2.4 05/23/2020 11:14 AM   CHOLHDL 4.8 02/20/2015 04:40 AM   LDLCALC 54 05/23/2020 11:14 AM    Wt Readings from Last 3 Encounters:  02/21/21 295 lb (133.8 kg)  02/13/21 (!) 302 lb 2  oz (137 kg)  11/22/20 (!) 306 lb 9.6 oz (139.1 kg)     Objective:    VS:  BP 128/88 (BP Location: Right Arm, Patient Position: Sitting, Cuff Size: Large)    Pulse 63    Ht 5' 2.75" (1.594 m)    Wt 295 lb (133.8 kg)    SpO2 97%    BMI 52.67 kg/m  , BMI Body mass index is 52.67 kg/m. GENERAL:  Well appearing HEENT: Pupils equal round and reactive, fundi not visualized, oral mucosa unremarkable.   NECK:  No jugular venous distention, waveform within normal limits, carotid upstroke brisk and symmetric, no bruits LUNGS:  Clear to auscultation bilaterally HEART:  RRR.  PMI not displaced or sustained,S1 and S2 within normal limits, no S3, no S4, no clicks, no rubs, no murmurs ABD:  Flat, positive bowel sounds normal in frequency in pitch, no bruits, no rebound, no guarding, no midline pulsatile mass, no hepatomegaly, no splenomegaly EXT:  2 plus pulses throughout, no edema, no cyanosis no clubbing SKIN:  No rashes no nodules NEURO:  Cranial nerves II through XII grossly intact, motor grossly intact throughout PSYCH:  Cognitively intact, oriented to person place and time   ASSESSMENT & PLAN:   Chronic diastolic heart failure ([place]) She is stable and doing well.  Continue carvedilol, losartan, Isordil, and spironolactone.  BP is stable.   Hyperlipidemia Continue rosuvastatin.  Lipids are well controlled 05/2020.  Morbid obesity due to excess calories [facility]) She struggles with morbid obesity.  She also has several cardiovascular risk factors including diabetes, hypertension, and hyperlipidemia.  She is unable to exercise much due to her right knee giving out and her shortness of breath.  We will start Ozempic which should also help with her diabetes.  She will start with 0.25 mg once per week for 4 weeks, followed by 0.5 mg once a week for 4 weeks, followed by 1 mg once a week.  We discussed not eating quickly as this can cause nausea and upset stomach due to slow GI transit.   Essential  hypertension Blood pressure is well controlled on her regimen of carvedilol, hydralazine, Isordil, losartan, and spironolactone.  Discussed increasing her exercise, but she is unable to due to her right knee giving out.  We will work on weight loss.  Obstructive sleep apnea Continue using BiPAP.  Atherosclerosis of aorta ([place]) Noted on chest CT.  Lipids are at goal.  Her LDL needs to be less than 70.     Medication Adjustments/Labs and Tests Ordered: Current medicines are reviewed at length with the patient today.  Concerns regarding medicines are outlined above.   Tests Ordered: No orders of the defined types were placed in this encounter.    Medication Changes: Meds ordered this encounter  Medications   Semaglutide,0.25 or 0.5MG /DOS, (OZEMPIC, 0.25 OR 0.5  MG/DOSE,) 2 MG/1.5ML SOPN    Sig: INJECT 0.25 MG WEEKLY FOR 4 WEEKS THEN INCREASE TO 0.5 MG WEEKLY FOR 4 WEEKS    Dispense:  1 mL    Refill:  1   Semaglutide, 1 MG/DOSE, (OZEMPIC, 1 MG/DOSE,) 4 MG/3ML SOPN    Sig: AFTER YOU FINISH FIRST 8 WEEKS INJECT 1 MG WEEKLY FOR 4 WEEKS THEN INCREASE TO 2 MG WEEKLY    Dispense:  1 mL    Refill:  5    Disposition: FU with Jamiaya Bina C. Oval Linsey, MD, [facility] in 3-4 months  I,Mykaella Javier,acting as a scribe for Skeet Latch, MD.,have documented all relevant documentation on the behalf of Skeet Latch, MD,as directed by  Skeet Latch, MD while in the presence of Skeet Latch, MD.  I, [place] Oval Linsey, MD have reviewed all documentation for this visit.  The documentation of the exam, diagnosis, procedures, and orders on 02/21/2021 are all accurate and complete.  Time spent: 35 minutes-Greater than 50% of this time was spent in counseling, explanation of diagnosis, planning of further management, and coordination of care.   Signed, Skeet Latch, MD  02/21/2021 1:05 PM    [facility]

## 2021-02-21 ENCOUNTER — Encounter (HOSPITAL_BASED_OUTPATIENT_CLINIC_OR_DEPARTMENT_OTHER): Payer: Self-pay | Admitting: Cardiovascular Disease

## 2021-02-21 ENCOUNTER — Ambulatory Visit (INDEPENDENT_AMBULATORY_CARE_PROVIDER_SITE_OTHER): Payer: 59 | Admitting: Cardiovascular Disease

## 2021-02-21 ENCOUNTER — Other Ambulatory Visit: Payer: Self-pay

## 2021-02-21 DIAGNOSIS — I5032 Chronic diastolic (congestive) heart failure: Secondary | ICD-10-CM

## 2021-02-21 DIAGNOSIS — I700 Atherosclerosis of aorta: Secondary | ICD-10-CM | POA: Insufficient documentation

## 2021-02-21 DIAGNOSIS — I10 Essential (primary) hypertension: Secondary | ICD-10-CM

## 2021-02-21 DIAGNOSIS — G4733 Obstructive sleep apnea (adult) (pediatric): Secondary | ICD-10-CM

## 2021-02-21 DIAGNOSIS — E7800 Pure hypercholesterolemia, unspecified: Secondary | ICD-10-CM | POA: Diagnosis not present

## 2021-02-21 HISTORY — DX: Atherosclerosis of aorta: I70.0

## 2021-02-21 MED ORDER — OZEMPIC (1 MG/DOSE) 4 MG/3ML ~~LOC~~ SOPN: PEN_INJECTOR | SUBCUTANEOUS | 5 refills | Status: DC

## 2021-02-21 MED ORDER — OZEMPIC (0.25 OR 0.5 MG/DOSE) 2 MG/1.5ML ~~LOC~~ SOPN: PEN_INJECTOR | SUBCUTANEOUS | 1 refills | Status: DC

## 2021-02-21 NOTE — Patient Instructions (Addendum)
Medication Instructions:  START OZEMPIC  INJECT 0.25 MG WEEKLY FOR 4 WEEKS  INCREASE TO 0.5 MG WEEKLY FOR 4 WEEKS INCREASE TO 1 MG WEEKLY   *If you need a refill on your cardiac medications before your next appointment, please call your pharmacy*  Lab Work: NONE    Testing/Procedures: NONE  Follow-Up: At [facility], you and your health needs are our priority.  As part of our continuing mission to provide you with exceptional heart care, we have created designated Provider Care Teams.  These Care Teams include your primary Cardiologist (physician) and Advanced Practice Providers (APPs -  Physician Assistants and Nurse Practitioners) who all work together to provide you with the care you need, when you need it.  We recommend signing up for the patient portal called "MyChart".  Sign up information is provided on this After Visit Summary.  MyChart is used to connect with patients for Virtual Visits (Telemedicine).  Patients are able to view lab/test results, encounter notes, upcoming appointments, etc.  Non-urgent messages can be sent to your provider as well.   To learn more about what you can do with MyChart, go to [email or web address].    Your next appointment:   3 month(s)  The format for your next appointment:   In Person  Provider:   Skeet Latch, MD   Semaglutide Injection What is this medication? SEMAGLUTIDE (SEM a GLOO tide) treats type 2 diabetes. It works by increasing insulin levels in your body, which decreases your blood sugar (glucose). It also reduces the amount of sugar released into the blood and slows down your digestion. It can also be used to lower the risk of heart attack and stroke in people with type 2 diabetes. Changes to diet and exercise are often combined with this medication. This medicine may be used for other purposes; ask your health care provider or pharmacist if you have questions. COMMON BRAND NAME(S): OZEMPIC What should I tell my care team  before I take this medication? They need to know if you have any of these conditions: Endocrine tumors (MEN 2) or if someone in your family had these tumors Eye disease, vision problems History of pancreatitis Kidney disease Stomach problems Thyroid cancer or if someone in your family had thyroid cancer An unusual or allergic reaction to semaglutide, other medications, foods, dyes, or preservatives Pregnant or trying to get pregnant Breast-feeding How should I use this medication? This medication is for injection under the skin of your upper leg (thigh), stomach area, or upper arm. It is given once every week (every 7 days). You will be taught how to prepare and give this medication. Use exactly as directed. Take your medication at regular intervals. Do not take it more often than directed. If you use this medication with insulin, you should inject this medication and the insulin separately. Do not mix them together. Do not give the injections right next to each other. Change (rotate) injection sites with each injection. It is important that you put your used needles and syringes in a special sharps container. Do not put them in a trash can. If you do not have a sharps container, call your pharmacist or care team to get one. A special MedGuide will be given to you by the pharmacist with each prescription and refill. Be sure to read this information carefully each time. This medication comes with INSTRUCTIONS FOR USE. Ask your pharmacist for directions on how to use this medication. Read the information carefully. Talk to your pharmacist  or care team if you have questions. Talk to your care team about the use of this medication in children. Special care may be needed. Overdosage: If you think you have taken too much of this medicine contact a poison control center or emergency room at once. NOTE: This medicine is only for you. Do not share this medicine with others. What if I miss a dose? If you  miss a dose, take it as soon as you can within 5 days after the missed dose. Then take your next dose at your regular weekly time. If it has been longer than 5 days after the missed dose, do not take the missed dose. Take the next dose at your regular time. Do not take double or extra doses. If you have questions about a missed dose, contact your care team for advice. What may interact with this medication? Other medications for diabetes Many medications may cause changes in blood sugar, these include: Alcohol containing beverages Antiviral medications for HIV or AIDS Aspirin and aspirin-like medications Certain medications for blood pressure, heart disease, irregular heart beat Chromium Diuretics Female hormones, such as estrogens or progestins, birth control pills Fenofibrate Gemfibrozil Isoniazid Lanreotide Female hormones or anabolic steroids MAOIs like Carbex, Eldepryl, Marplan, Nardil, and Parnate Medications for weight loss Medications for allergies, asthma, cold, or cough Medications for depression, anxiety, or psychotic disturbances Niacin Nicotine NSAIDs, medications for pain and inflammation, like ibuprofen or naproxen Octreotide Pasireotide Pentamidine Phenytoin Probenecid Quinolone antibiotics such as ciprofloxacin, levofloxacin, ofloxacin Some herbal dietary supplements Steroid medications such as prednisone or cortisone Sulfamethoxazole; trimethoprim Thyroid hormones Some medications can hide the warning symptoms of low blood sugar (hypoglycemia). You may need to monitor your blood sugar more closely if you are taking one of these medications. These include: Beta-blockers, often used for high blood pressure or heart problems (examples include atenolol, metoprolol, propranolol) Clonidine Guanethidine Reserpine This list may not describe all possible interactions. Give your health care provider a list of all the medicines, herbs, non-prescription drugs, or dietary  supplements you use. Also tell them if you smoke, drink alcohol, or use illegal drugs. Some items may interact with your medicine. What should I watch for while using this medication? Visit your care team for regular checks on your progress. Drink plenty of fluids while taking this medication. Check with your care team if you get an attack of severe diarrhea, nausea, and vomiting. The loss of too much body fluid can make it dangerous for you to take this medication. A test called the HbA1C (A1C) will be monitored. This is a simple blood test. It measures your blood sugar control over the last 2 to 3 months. You will receive this test every 3 to 6 months. Learn how to check your blood sugar. Learn the symptoms of low and high blood sugar and how to manage them. Always carry a quick-source of sugar with you in case you have symptoms of low blood sugar. Examples include hard sugar candy or glucose tablets. Make sure others know that you can choke if you eat or drink when you develop serious symptoms of low blood sugar, such as seizures or unconsciousness. They must get medical help at once. Tell your care team if you have high blood sugar. You might need to change the dose of your medication. If you are sick or exercising more than usual, you might need to change the dose of your medication. Do not skip meals. Ask your care team if you should avoid alcohol.  Many nonprescription cough and cold products contain sugar or alcohol. These can affect blood sugar. Pens should never be shared. Even if the needle is changed, sharing may result in passing of viruses like hepatitis or HIV. Wear a medical ID bracelet or chain, and carry a card that describes your disease and details of your medication and dosage times. Do not become pregnant while taking this medication. Women should inform their care team if they wish to become pregnant or think they might be pregnant. There is a potential for serious side effects to an  unborn child. Talk to your care team for more information. What side effects may I notice from receiving this medication? Side effects that you should report to your care team as soon as possible: Allergic reactions--skin rash, itching, hives, swelling of the face, lips, tongue, or throat Change in vision Dehydration--increased thirst, dry mouth, feeling faint or lightheaded, headache, dark yellow or brown urine Gallbladder problems--severe stomach pain, nausea, vomiting, fever Heart palpitations--rapid, pounding, or irregular heartbeat Kidney injury--decrease in the amount of urine, swelling of the ankles, hands, or feet Pancreatitis--severe stomach pain that spreads to your back or gets worse after eating or when touched, fever, nausea, vomiting Thyroid cancer--new mass or lump in the neck, pain or trouble swallowing, trouble breathing, hoarseness Side effects that usually do not require medical attention (report to your care team if they continue or are bothersome): Diarrhea Loss of appetite Nausea Stomach pain Vomiting This list may not describe all possible side effects. Call your doctor for medical advice about side effects. You may report side effects to FDA at 1-800-FDA-1088. Where should I keep my medication? Keep out of the reach of children. Store unopened pens in a refrigerator between 2 and 8 degrees C (36 and 46 degrees F). Do not freeze. Protect from light and heat. After you first use the pen, it can be stored for 56 days at room temperature between 15 and 30 degrees C (59 and 86 degrees F) or in a refrigerator. Throw away your used pen after 56 days or after the expiration date, whichever comes first. Do not store your pen with the needle attached. If the needle is left on, medication may leak from the pen. NOTE: This sheet is a summary. It may not cover all possible information. If you have questions about this medicine, talk to your doctor, pharmacist, or health care  provider.  2022 Elsevier/Gold Standard (2020-04-13 00:00:00)

## 2021-02-21 NOTE — Assessment & Plan Note (Addendum)
She struggles with morbid obesity.  She also has several cardiovascular risk factors including diabetes, hypertension, and hyperlipidemia.  She is unable to exercise much due to her right knee giving out and her shortness of breath.  We will start Ozempic which should also help with her diabetes.  She will start with 0.25 mg once per week for 4 weeks, followed by 0.5 mg once a week for 4 weeks, followed by 1 mg once a week.  We discussed not eating quickly as this can cause nausea and upset stomach due to slow GI transit.

## 2021-02-21 NOTE — Assessment & Plan Note (Signed)
She is stable and doing well.  Continue carvedilol, losartan, Isordil, and spironolactone.  BP is stable.

## 2021-02-21 NOTE — Assessment & Plan Note (Signed)
Continue rosuvastatin.  Lipids are well controlled 05/2020.

## 2021-02-21 NOTE — Assessment & Plan Note (Signed)
Continue using BiPAP. 

## 2021-02-21 NOTE — Assessment & Plan Note (Signed)
Noted on chest CT.  Lipids are at goal.  Her LDL needs to be less than 70.

## 2021-02-21 NOTE — Assessment & Plan Note (Signed)
Blood pressure is well controlled on her regimen of carvedilol, hydralazine, Isordil, losartan, and spironolactone.  Discussed increasing her exercise, but she is unable to due to her right knee giving out.  We will work on weight loss.

## 2021-02-26 ENCOUNTER — Other Ambulatory Visit (HOSPITAL_BASED_OUTPATIENT_CLINIC_OR_DEPARTMENT_OTHER): Payer: Self-pay | Admitting: Cardiovascular Disease

## 2021-02-26 ENCOUNTER — Encounter: Payer: Self-pay | Admitting: Gastroenterology

## 2021-03-01 ENCOUNTER — Telehealth (HOSPITAL_BASED_OUTPATIENT_CLINIC_OR_DEPARTMENT_OTHER): Payer: Self-pay | Admitting: Cardiovascular Disease

## 2021-03-01 NOTE — Telephone Encounter (Signed)
-  Pt updated and state she is terrified to giver herself an injection. -Pt state she believe she was told by Dr. Oval Linsey that some [facility] will administer injections for pts. Pt is questioning if that's an option.

## 2021-03-01 NOTE — Telephone Encounter (Signed)
Discussed with Dr Oval Linsey and she does not recall discussion of [facility]  Spoke with patient and she will come tomorrow and meet with Marlan Palau D for to discuss

## 2021-03-01 NOTE — Telephone Encounter (Signed)
Nurse spoke to pt. She report she is very nervous about giving herself weekly injections and questioning if our office could either administer them regularly or be switched to something oral.  Will forward to Pharm D

## 2021-03-01 NOTE — Telephone Encounter (Signed)
No, we cannot administer medication in the office each week.  However if she wants to come to [facility] at 10 am or 3:45 pm tomorrow (Friday Feb 10), I would be happy to show her how to self inject.  She will need to bring her medication (whole box, with pen and needles)

## 2021-03-01 NOTE — Telephone Encounter (Signed)
New Message:    Patient said the pharmacist said since she is so nervous about giving herself the injection. He told her to , please ask Dr Oval Linsey to prescribe it , so it can be oral. She needs this before Saturday.

## 2021-03-02 ENCOUNTER — Other Ambulatory Visit: Payer: Self-pay

## 2021-03-02 ENCOUNTER — Ambulatory Visit (INDEPENDENT_AMBULATORY_CARE_PROVIDER_SITE_OTHER): Payer: 59 | Admitting: Pharmacist Clinician (PhC)/ Clinical Pharmacy Specialist

## 2021-03-02 NOTE — Progress Notes (Signed)
Patient came to office today for [facility] teaching.  Had tried dose based on information from pharmacy, but had fear of needles.  Taught injection technique and patient was able to successfully give dose of Ozempic in office.

## 2021-03-05 ENCOUNTER — Other Ambulatory Visit: Payer: Self-pay

## 2021-03-05 ENCOUNTER — Encounter (HOSPITAL_COMMUNITY): Payer: Self-pay

## 2021-03-05 ENCOUNTER — Emergency Department (HOSPITAL_COMMUNITY): Payer: 59

## 2021-03-05 ENCOUNTER — Telehealth: Payer: Self-pay | Admitting: Gastroenterology

## 2021-03-05 ENCOUNTER — Emergency Department (HOSPITAL_COMMUNITY)
Admission: EM | Admit: 2021-03-05 | Discharge: 2021-03-06 | Disposition: A | Discharge status: Home / Self Care | Payer: 59 | Attending: Emergency Medicine | Admitting: Emergency Medicine

## 2021-03-05 DIAGNOSIS — K219 Gastro-esophageal reflux disease without esophagitis: Secondary | ICD-10-CM | POA: Insufficient documentation

## 2021-03-05 DIAGNOSIS — R0789 Other chest pain: Secondary | ICD-10-CM | POA: Insufficient documentation

## 2021-03-05 DIAGNOSIS — J441 Chronic obstructive pulmonary disease with (acute) exacerbation: Secondary | ICD-10-CM | POA: Insufficient documentation

## 2021-03-05 DIAGNOSIS — Z20822 Contact with and (suspected) exposure to covid-19: Secondary | ICD-10-CM | POA: Diagnosis not present

## 2021-03-05 DIAGNOSIS — Z91040 Latex allergy status: Secondary | ICD-10-CM | POA: Insufficient documentation

## 2021-03-05 DIAGNOSIS — R052 Subacute cough: Secondary | ICD-10-CM | POA: Diagnosis not present

## 2021-03-05 DIAGNOSIS — R0602 Shortness of breath: Secondary | ICD-10-CM | POA: Diagnosis not present

## 2021-03-05 DIAGNOSIS — R1310 Dysphagia, unspecified: Secondary | ICD-10-CM | POA: Insufficient documentation

## 2021-03-05 DIAGNOSIS — D72829 Elevated white blood cell count, unspecified: Secondary | ICD-10-CM | POA: Diagnosis not present

## 2021-03-05 DIAGNOSIS — I110 Hypertensive heart disease with heart failure: Secondary | ICD-10-CM | POA: Diagnosis not present

## 2021-03-05 DIAGNOSIS — I5032 Chronic diastolic (congestive) heart failure: Secondary | ICD-10-CM | POA: Insufficient documentation

## 2021-03-05 DIAGNOSIS — Z79899 Other long term (current) drug therapy: Secondary | ICD-10-CM | POA: Diagnosis not present

## 2021-03-05 LAB — CBC
HCT: 39.6 % (ref 36.0–46.0)
Hemoglobin: 12.7 g/dL (ref 12.0–15.0)
MCH: 30.8 pg (ref 26.0–34.0)
MCHC: 32.1 g/dL (ref 30.0–36.0)
MCV: 96.1 fL (ref 80.0–100.0)
Platelets: 259 10*3/uL (ref 150–400)
RBC: 4.12 MIL/uL (ref 3.87–5.11)
RDW: 14.8 % (ref 11.5–15.5)
WBC: 13 10*3/uL — ABNORMAL HIGH (ref 4.0–10.5)
nRBC: 0 % (ref 0.0–0.2)

## 2021-03-05 LAB — BASIC METABOLIC PANEL
Anion gap: 9 (ref 5–15)
BUN: 15 mg/dL (ref 8–23)
CO2: 25 mmol/L (ref 22–32)
Calcium: 9 mg/dL (ref 8.9–10.3)
Chloride: 106 mmol/L (ref 98–111)
Creatinine, Ser: 1.01 mg/dL — ABNORMAL HIGH (ref 0.44–1.00)
GFR, Estimated: >60 mL/min (ref >60)
Glucose, Bld: 109 mg/dL — ABNORMAL HIGH (ref 70–99)
Potassium: 3.3 mmol/L — ABNORMAL LOW (ref 3.5–5.1)
Sodium: 140 mmol/L (ref 135–145)

## 2021-03-05 LAB — BRAIN NATRIURETIC PEPTIDE: B Natriuretic Peptide: 25.7 pg/mL (ref 0.0–100.0)

## 2021-03-05 NOTE — Telephone Encounter (Signed)
Please send prescription for Linzess 290 micrograms daily, advised patient to take 1 capsule daily before meals.  Stop MiraLAX.  Continue Dexilant in the morning and use Pepcid 20 mg daily in the evening after dinner.  Please schedule office follow-up visit in 3 to 4 weeks.  Thanks

## 2021-03-05 NOTE — Telephone Encounter (Signed)
Inbound call from patient states since she have been on Dexlansoprazole, her GERD has worsen. States it is causing her to vomit acid that is thick white causing her to be unable to sleep

## 2021-03-05 NOTE — ED Triage Notes (Signed)
Patient c/o a productive cough with thick white sputum, diarrhea, gastric reflux when she lays down, lightheadedness and difficulty swallowing when she tries to swallow water x 3 days.

## 2021-03-05 NOTE — Telephone Encounter (Signed)
Patient talked a little more this phone call. She said she has stopped Dexilant and resumed Pantoprazole 40 mg. She is taking Pepcid 20 mg at hs and Linzess 290 mcg. She is concerned that when she stops MiraLAX, she will become constipated.  She wanted you to know she started Ozempic Friday in an effort to begin losing weight. She has been assured it will not cause any issues with her other medications and it is not causing her any GI problems. "These problems were already there."

## 2021-03-05 NOTE — Telephone Encounter (Signed)
Spoke with the patient. She reports she is experiencing increased reflux, worse at night and also issues with increased flatus. She has been on Dexilant for about 3 weeks now. She is awakening with reflux, starts coughing and "vomits" white mucous. Her pulmonologist feels it is reflux issue. She complains also that she has had an increase of intestinal gas since starting Miralax. "Like someone shoot me up with gas." Reports diarrhea Friday. Saturday and Sunday very little stool, mostly gas. The stool she did pass was diarrhea.

## 2021-03-05 NOTE — ED Provider Triage Note (Signed)
,  Emergency Medicine Provider Triage Evaluation Note  LEONETTE TISCHER , a 61 y.o. female  was evaluated in triage.  Pt complains of shortness of breath, orthopnea and increased productivity of cough.  She states has been coughing up copious white phlegm.  Shortness of breath is worse with lying supine.  She also endorses bilateral lower leg swelling.  Review of Systems  Positive:  Negative: See above   Physical Exam  BP 122/83 (BP Location: Right Arm)    Pulse 100    Temp 98.7 F (37.1 C) (Oral)    Resp 18    Ht 5\' 3"  (1.6 m)    Wt 133.8 kg    SpO2 96%    BMI 52.26 kg/m  Gen:   Awake, no distress   Resp:  Normal effort  MSK:   Moves extremities without difficulty  Other:    Medical Decision Making  Medically screening exam initiated at 7:21 PM.  Appropriate orders placed.  TAZARIA DLUGOSZ was informed that the remainder of the evaluation will be completed by another provider, this initial triage assessment does not replace that evaluation, and the importance of remaining in the ED until their evaluation is complete.     Myna Bright [place], [place] 03/05/21 1921

## 2021-03-06 ENCOUNTER — Encounter (HOSPITAL_COMMUNITY): Payer: Self-pay

## 2021-03-06 ENCOUNTER — Emergency Department (HOSPITAL_COMMUNITY): Payer: 59

## 2021-03-06 LAB — D-DIMER, QUANTITATIVE: D-Dimer, Quant: 0.81 ug/mL-FEU — ABNORMAL HIGH (ref 0.00–0.50)

## 2021-03-06 LAB — RESP PANEL BY RT-PCR (FLU A&B, COVID) ARPGX2
Influenza A by PCR: NEGATIVE
Influenza B by PCR: NEGATIVE
SARS Coronavirus 2 by RT PCR: NEGATIVE

## 2021-03-06 MED ORDER — BENZONATATE 100 MG PO CAPS
200.0000 mg | ORAL_CAPSULE | Freq: Once | ORAL | Status: AC
  Administered 2021-03-06: 200 mg via ORAL
  Filled 2021-03-06: qty 2

## 2021-03-06 MED ORDER — SODIUM CHLORIDE (PF) 0.9 % IJ SOLN
INTRAMUSCULAR | Status: AC
  Filled 2021-03-06: qty 50

## 2021-03-06 MED ORDER — GUAIFENESIN-CODEINE 100-10 MG/5ML PO SYRP: 5.0000 mL | ORAL_SOLUTION | Freq: Three times a day (TID) | ORAL | 0 refills | Status: DC | PRN

## 2021-03-06 MED ORDER — IOHEXOL 350 MG/ML SOLN
100.0000 mL | Freq: Once | INTRAVENOUS | Status: AC | PRN
Start: 2021-03-06 — End: 2021-03-06
  Administered 2021-03-06: 100 mL via INTRAVENOUS

## 2021-03-06 MED ORDER — POTASSIUM CHLORIDE CRYS ER 20 MEQ PO TBCR
40.0000 meq | EXTENDED_RELEASE_TABLET | Freq: Once | ORAL | Status: AC
  Administered 2021-03-06: 40 meq via ORAL
  Filled 2021-03-06: qty 2

## 2021-03-06 MED ORDER — FUROSEMIDE 10 MG/ML IJ SOLN
40.0000 mg | Freq: Once | INTRAMUSCULAR | Status: AC
  Administered 2021-03-06: 40 mg via INTRAVENOUS
  Filled 2021-03-06: qty 4

## 2021-03-06 NOTE — ED Provider Notes (Signed)
[facility] DEPT Provider Note   CSN: [id number] Arrival date & time: 03/05/21  1819     History  Chief Complaint  Patient presents with   Cough   Dysphagia   Diarrhea   Gastroesophageal Reflux    [name] is a 61 y.o. female.  The history is provided by the patient and medical records.  Cough Diarrhea Gastroesophageal Reflux [name] is a 61 y.o. female who presents to the Emergency Department complaining of cough.  She has a history of chronic diastolic heart failure, hypertension, OSA, COPD, lung COVID here for evaluation of acute on chronic cough.  She states she does have a chronic cough but over the last several days that she has been coughing up more fluid than usual.  She has associated posttussive emesis.  Symptoms started worsening on Friday.  She started taking Delsym over-the-counter, which has only made her cough worse.  She also has been taking Mucinex DM, which was recommended by her ENT doctor.  Cough is productive of white sputum.  No associated fever.  She does get some shortness of breath.  No lower extremity edema.  She reports associated central chest pain when she coughs as well as sometimes when she breathes.  She states the CPAP seems to make her coughing worse.     Home Medications Prior to Admission medications   Medication Sig Start Date End Date Taking? Authorizing Provider  guaiFENesin-codeine (ROBITUSSIN AC) 100-10 MG/5ML syrup Take 5 mLs by mouth 3 (three) times daily as needed for cough. 03/06/21  Yes Quintella Reichert, MD  acetaminophen (TYLENOL 8 HOUR) 650 MG CR tablet Take 1 tablet (650 mg total) by mouth every 8 (eight) hours as needed for pain. 12/18/19   Suzy Bouchard, PA-C  Azelastine-Fluticasone 137-50 MCG/ACT SUSP Place 137 mcg into the nose in the morning, at noon, and at bedtime.    [provider]  benzonatate (TESSALON) 100 MG capsule Take 100 mg by mouth 3 (three) times daily as  needed for cough. 10/09/20   [provider]  carvedilol (COREG) 12.5 MG tablet Take 1 tablet (12.5 mg total) by mouth 2 (two) times daily with a meal. 04/19/20   Skeet Latch, MD  dexlansoprazole (DEXILANT) 60 MG capsule Take 1 capsule (60 mg total) by mouth daily. Daily in the morning 02/13/21   Nandigam, Venia Minks, MD  dicyclomine (BENTYL) 10 MG capsule Take 1 capsule (10 mg total) by mouth every 8 (eight) hours as needed for spasms. 04/24/20   Mauri Pole, MD  EPINEPHrine 0.3 mg/0.3 mL IJ SOAJ injection Inject 0.3 mg into the muscle as needed for anaphylaxis. 12/19/19   [provider]  ergocalciferol (VITAMIN D2) 1.25 MG (50000 UT) capsule Take 50,000 Units by mouth once a week. Monday's 05/18/20   [provider]  FARXIGA 10 MG TABS tablet Take 10 mg by mouth daily. 10/18/20   [provider]  fluticasone (FLONASE) 50 MCG/ACT nasal spray Place 1 spray into the nose daily. 03/03/20   [provider]  hydrALAZINE (APRESOLINE) 25 MG tablet TAKE 1/2 OF A TABLET (12.5 MG TOTAL) BY MOUTH 3 TIMES A DAY 02/26/21   Skeet Latch, MD  ipratropium (ATROVENT) 0.06 % nasal spray Place 2 sprays into both nostrils 3 (three) times daily.    [provider]  ipratropium-albuterol (DUONEB) 0.5-2.5 (3) MG/3ML SOLN Take 3 mLs by nebulization in the morning, at noon, in the evening, and at bedtime.  [provider]  isosorbide dinitrate (ISORDIL) 20 MG tablet Take 1 tablet (20 mg total) by mouth 3 (three) times daily. 04/19/20   Skeet Latch, MD  levocetirizine (XYZAL) 5 MG tablet Take 5 mg by mouth every evening.    [provider]  LINZESS 290 MCG CAPS capsule TAKE 1 CAPSULE BY MOUTH EVERY DAY BEFORE BREAKFAST 01/23/21   Zehr, Janett Billow D, PA-C  losartan (COZAAR) 50 MG tablet TAKE 1 TABLET BY MOUTH EVERY DAY 01/16/21   Skeet Latch, MD  meclizine (ANTIVERT) 25 MG tablet Take 25 mg by mouth 2 (two) times daily as needed for  dizziness.    [provider]  meloxicam (MOBIC) 15 MG tablet Take 15 mg by mouth daily. 11/20/20   [provider]  metolazone (ZAROXOLYN) 2.5 MG tablet Take 1 tablet (2.5 mg total) by mouth daily as needed (FOR OVERNIGHT WEIGHT GAIN OF 3 lbs. or more). 02/28/19   Skeet Latch, MD  montelukast (SINGULAIR) 10 MG tablet TAKE 1 TABLET(10 MG) BY MOUTH AT BEDTIME Patient taking differently: Take 10 mg by mouth at bedtime. 07/25/17   Tanda Rockers, MD  Potassium Chloride ER 20 MEQ TBCR TAKE 3 TABLETS BY MOUTH 3 TIMES A DAY 01/02/21   Skeet Latch, MD  Respiratory Therapy Supplies (FLUTTER) DEVI 1 application by Does not apply route as directed. 12/27/16   Tanda Rockers, MD  rosuvastatin (CRESTOR) 20 MG tablet TAKE 1 TABLET BY MOUTH EVERY DAY 01/23/21   Skeet Latch, MD  Semaglutide, 1 MG/DOSE, (OZEMPIC, 1 MG/DOSE,) 4 MG/3ML SOPN AFTER YOU FINISH FIRST 8 WEEKS INJECT 1 MG WEEKLY FOR 4 WEEKS THEN INCREASE TO 2 MG WEEKLY 02/21/21   Skeet Latch, MD  Semaglutide,0.25 or 0.5MG /DOS, (OZEMPIC, 0.25 OR 0.5 MG/DOSE,) 2 MG/1.5ML SOPN INJECT 0.25 MG WEEKLY FOR 4 WEEKS THEN INCREASE TO 0.5 MG WEEKLY FOR 4 WEEKS 02/21/21   Skeet Latch, MD  spironolactone (ALDACTONE) 25 MG tablet TAKE 1/2 TABLETS BY MOUTH 2 TIMES DAILY. 12/25/20   Skeet Latch, MD  topiramate (TOPAMAX) 25 MG tablet Take 25 mg by mouth 2 (two) times daily as needed (for migraines).     [provider]  torsemide (DEMADEX) 20 MG tablet TAKE 2 TABLETS BY MOUTH 2 TIMES DAILY. Patient taking differently: Take 40 mg by mouth 2 (two) times daily. 07/25/20   Skeet Latch, MD  traMADol (ULTRAM) 50 MG tablet Take 1 tablet (50 mg total) by mouth 3 (three) times daily as needed for moderate pain. 01/19/20   Deneise Lever, MD  TRELEGY ELLIPTA 200-62.5-25 MCG/INH AEPB Inhale 1 puff into the lungs daily. 05/23/20   [provider]  vitamin C (ASCORBIC ACID) 250 MG tablet Take 250 mg by mouth daily.     [provider]      Allergies    Sulfonamide derivatives, Doxycycline, Latex, Ciprofloxacin, Fish allergy, Hydrocortisone, and Neomycin    Review of Systems   Review of Systems  Respiratory:  Positive for cough.   Gastrointestinal:  Positive for diarrhea.  All other systems reviewed and are negative.  Physical Exam Updated Vital Signs BP (!) 149/96    Pulse 77    Temp 98.7 F (37.1 C) (Oral)    Resp 18    Ht 5\' 3"  (1.6 m)    Wt 133.8 kg    SpO2 93%    BMI 52.26 kg/m  Physical Exam Vitals and nursing note reviewed.  Constitutional:      Appearance: She is well-developed.  HENT:  Head: Normocephalic and atraumatic.  Cardiovascular:     Rate and Rhythm: Normal rate and regular rhythm.     Heart sounds: No murmur heard. Pulmonary:     Effort: Pulmonary effort is normal. No respiratory distress.     Breath sounds: Normal breath sounds.  Abdominal:     Palpations: Abdomen is soft.     Tenderness: There is no abdominal tenderness. There is no guarding or rebound.  Musculoskeletal:        General: No tenderness.     Comments: Trace pitting edema to BLE  Skin:    General: Skin is warm and dry.  Neurological:     Mental Status: She is alert and oriented to person, place, and time.  Psychiatric:        Behavior: Behavior normal.    ED Results / Procedures / Treatments   Labs (all labs ordered are listed, but only abnormal results are displayed) Labs Reviewed  BASIC METABOLIC PANEL - Abnormal; Notable for the following components:      Result Value   Potassium 3.3 (*)    Glucose, Bld 109 (*)    Creatinine, Ser 1.01 (*)    All other components within normal limits  CBC - Abnormal; Notable for the following components:   WBC 13.0 (*)    All other components within normal limits  D-DIMER, QUANTITATIVE - Abnormal; Notable for the following components:   D-Dimer, Quant 0.81 (*)    All other components within normal limits  RESP PANEL BY RT-PCR (FLU A&B, COVID)  ARPGX2  BRAIN NATRIURETIC PEPTIDE    EKG EKG Interpretation  Date/Time:  Tuesday March 06 2021 04:34:26 EST Ventricular Rate:  78 PR Interval:  215 QRS Duration: 91 QT Interval:  379 QTC Calculation: 432 R Axis:   92 Text Interpretation: Sinus rhythm Borderline prolonged PR interval Anterior infarct, old Confirmed by Quintella Reichert [phone]) on 03/06/2021 4:39:43 AM  Radiology DG Chest 2 View  Result Date: 03/05/2021 CLINICAL DATA:  Shortness of breath, productive cough EXAM: CHEST - 2 VIEW COMPARISON:  10/22/2020 FINDINGS: Frontal and lateral views of the chest demonstrate a stable enlarged cardiac silhouette. No acute airspace disease, effusion, or pneumothorax. No acute bony abnormalities. IMPRESSION: 1. Stable chest, no acute process. Electronically Signed   By: Randa Ngo M.D.   On: 03/05/2021 19:36   CT Angio Chest PE W/Cm &/Or Wo Cm  Result Date: 03/06/2021 CLINICAL DATA:  Pulmonary embolism suspected, high probability. Productive cough and gastric reflux. Elevated D-dimer. EXAM: CT ANGIOGRAPHY CHEST WITH CONTRAST TECHNIQUE: Multidetector CT imaging of the chest was performed using the standard protocol during bolus administration of intravenous contrast. Multiplanar CT image reconstructions and MIPs were obtained to evaluate the vascular anatomy. RADIATION DOSE REDUCTION: This exam was performed according to the departmental dose-optimization program which includes automated exposure control, adjustment of the mA and/or kV according to patient size and/or use of iterative reconstruction technique. CONTRAST:  119mL OMNIPAQUE IOHEXOL 350 MG/ML SOLN COMPARISON:  11/16/2020. FINDINGS: Cardiovascular: The heart is enlarged and there is no pericardial effusion. The aorta and pulmonary trunk are normal in caliber. No definite evidence of pulmonary embolism. Evaluation of the pulmonary arteries at the lung bases is limited due to respiratory motion artifact. Mediastinum/Nodes: No  mediastinal, hilar, or axillary lymphadenopathy. The right lobe of the thyroid gland is elongated and extends posterior to the trachea. The thyroid gland is slightly heterogeneous in attenuation. The trachea and esophagus are within normal limits. There is a small hiatal  hernia. Lungs/Pleura: Mild atelectasis is present bilaterally. No effusion or pneumothorax. Upper Abdomen: No acute abnormality. Musculoskeletal: Degenerative changes are present in the thoracic spine. No acute osseous abnormality. Review of the MIP images confirms the above findings. IMPRESSION: 1. No definite evidence of pulmonary embolism. Evaluation of the pulmonary arteries at the lung bases is limited due to respiratory motion artifact. 2. No acute intrathoracic process. 3. Cardiomegaly. 4. Small hiatal hernia. Electronically Signed   By: Brett Fairy M.D.   On: 03/06/2021 04:18    Procedures Procedures    Medications Ordered in ED Medications  sodium chloride (PF) 0.9 % injection (has no administration in time range)  benzonatate (TESSALON) capsule 200 mg (200 mg Oral Given 03/06/21 0256)  furosemide (LASIX) injection 40 mg (40 mg Intravenous Given 03/06/21 0256)  potassium chloride SA (KLOR-CON M) CR tablet 40 mEq (40 mEq Oral Given 03/06/21 0255)  iohexol (OMNIPAQUE) 350 MG/ML injection 100 mL (100 mLs Intravenous Contrast Given 03/06/21 0353)    ED Course/ Medical Decision Making/ A&P                           Medical Decision Making Amount and/or Complexity of Data Reviewed Labs: ordered. Radiology: ordered.  Risk OTC drugs. Prescription drug management.   Patient with history of COPD, OSA, CHF here for evaluation of acute on chronic cough.  On examination she does have occasional coughing but no respiratory distress.  Lungs are clear bilaterally.  She does have trace edema to bilateral lower extremities.  BNP is within normal limits.  CBC with mild leukocytosis but no evidence of acute infectious process at this  time.  D-dimer was obtained, which is mildly elevated and CTA was obtained.  CTA is negative for PE, pneumonia.  BMP with stable renal function.  Discussed with patient unclear source of cough, this may be multifactorial in nature.  She is currently treated for reflux.  She is on CPAP, which seems to contribute to her cough.  Her cough did not significantly improve with medications in the emergency department.  Will treat with antitussive for home with outpatient pulmonary follow-up as well as return precautions.   Current clinical picture is not consistent with ACS, decompensated CHF, pneumonia, COPD exacerbation.        Final Clinical Impression(s) / ED Diagnoses Final diagnoses:  Subacute cough    Rx / DC Orders ED Discharge Orders          Ordered    guaiFENesin-codeine [facility] AC) 100-10 MG/5ML syrup  3 times daily PRN        03/06/21 0458              Quintella Reichert, MD 03/06/21 0500

## 2021-03-06 NOTE — Telephone Encounter (Signed)
Ozempic can exacerbate symptoms of gastroparesis and GERD.  Recent worsening of symptoms likely related to it.  If she is experiencing a lot of GI related symptoms, it is best to discontinue Ozempic.

## 2021-03-08 ENCOUNTER — Other Ambulatory Visit: Payer: Self-pay

## 2021-03-08 NOTE — Telephone Encounter (Signed)
Spoke with the patient about this. She is aware and will keep this in mind.

## 2021-03-16 ENCOUNTER — Telehealth: Payer: Self-pay

## 2021-03-16 ENCOUNTER — Telehealth: Payer: Self-pay | Admitting: Pharmacist

## 2021-03-16 NOTE — Telephone Encounter (Signed)
Spoke with patient. She reports she prepared and ate a chicken last Sunday 03/11/21. She developed diarrhea and stomach cramps that same day. This has continued through the week. She was concerned she had food poisoning. She took 2 doses of Miralax to "flush" her system. She has tried eating bland and light such as jello and popsicles. She is having "pure water stool" " green and smells bad" with a feeling of being bloated. She is experiencing stomach cramps. Afebrile. No nausea or vomiting. She ate fries and part of a "[employer] today but could not finish it because of the stomach cramps. She is scheduled to inject her Ozempic today. Unsure if she should wait?  I have encouraged her to eat bland, focus on hydration to replace electrolytes. She will take her dicyclomine for her stomach cramps. She will seek recommendation on Ozempic from her prescribing provider. Please advise.

## 2021-03-16 NOTE — Telephone Encounter (Signed)
Patient believes she has food poisoning and is due for Ozempic injection today.  Advised to skip this week and administer next Friday if she is feeling better

## 2021-03-17 ENCOUNTER — Emergency Department (HOSPITAL_BASED_OUTPATIENT_CLINIC_OR_DEPARTMENT_OTHER): Payer: 59

## 2021-03-17 ENCOUNTER — Other Ambulatory Visit: Payer: Self-pay

## 2021-03-17 ENCOUNTER — Encounter (HOSPITAL_BASED_OUTPATIENT_CLINIC_OR_DEPARTMENT_OTHER): Payer: Self-pay

## 2021-03-17 ENCOUNTER — Emergency Department (HOSPITAL_BASED_OUTPATIENT_CLINIC_OR_DEPARTMENT_OTHER)
Admission: EM | Admit: 2021-03-17 | Discharge: 2021-03-17 | Disposition: A | Discharge status: Home / Self Care | Payer: 59 | Attending: Emergency Medicine | Admitting: Emergency Medicine

## 2021-03-17 DIAGNOSIS — I130 Hypertensive heart and chronic kidney disease with heart failure and stage 1 through stage 4 chronic kidney disease, or unspecified chronic kidney disease: Secondary | ICD-10-CM | POA: Insufficient documentation

## 2021-03-17 DIAGNOSIS — Z79899 Other long term (current) drug therapy: Secondary | ICD-10-CM | POA: Insufficient documentation

## 2021-03-17 DIAGNOSIS — R1084 Generalized abdominal pain: Secondary | ICD-10-CM | POA: Diagnosis not present

## 2021-03-17 DIAGNOSIS — I5030 Unspecified diastolic (congestive) heart failure: Secondary | ICD-10-CM | POA: Diagnosis not present

## 2021-03-17 DIAGNOSIS — R197 Diarrhea, unspecified: Secondary | ICD-10-CM | POA: Insufficient documentation

## 2021-03-17 DIAGNOSIS — R778 Other specified abnormalities of plasma proteins: Secondary | ICD-10-CM | POA: Insufficient documentation

## 2021-03-17 DIAGNOSIS — N183 Chronic kidney disease, stage 3 unspecified: Secondary | ICD-10-CM | POA: Insufficient documentation

## 2021-03-17 DIAGNOSIS — I2510 Atherosclerotic heart disease of native coronary artery without angina pectoris: Secondary | ICD-10-CM | POA: Diagnosis not present

## 2021-03-17 DIAGNOSIS — R7989 Other specified abnormal findings of blood chemistry: Secondary | ICD-10-CM | POA: Diagnosis not present

## 2021-03-17 DIAGNOSIS — J449 Chronic obstructive pulmonary disease, unspecified: Secondary | ICD-10-CM | POA: Diagnosis not present

## 2021-03-17 DIAGNOSIS — D72829 Elevated white blood cell count, unspecified: Secondary | ICD-10-CM | POA: Diagnosis not present

## 2021-03-17 DIAGNOSIS — Z91040 Latex allergy status: Secondary | ICD-10-CM | POA: Diagnosis not present

## 2021-03-17 DIAGNOSIS — Z7951 Long term (current) use of inhaled steroids: Secondary | ICD-10-CM | POA: Diagnosis not present

## 2021-03-17 LAB — COMPREHENSIVE METABOLIC PANEL
ALT: 19 U/L (ref 0–44)
AST: 38 U/L (ref 15–41)
Albumin: 3.8 g/dL (ref 3.5–5.0)
Alkaline Phosphatase: 71 U/L (ref 38–126)
Anion gap: 9 (ref 5–15)
BUN: 17 mg/dL (ref 8–23)
CO2: 24 mmol/L (ref 22–32)
Calcium: 9.5 mg/dL (ref 8.9–10.3)
Chloride: 103 mmol/L (ref 98–111)
Creatinine, Ser: 1.13 mg/dL — ABNORMAL HIGH (ref 0.44–1.00)
GFR, Estimated: 55 mL/min — ABNORMAL LOW (ref >60)
Glucose, Bld: 94 mg/dL (ref 70–99)
Potassium: 5.1 mmol/L (ref 3.5–5.1)
Sodium: 136 mmol/L (ref 135–145)
Total Bilirubin: 0.6 mg/dL (ref 0.3–1.2)
Total Protein: 8.4 g/dL — ABNORMAL HIGH (ref 6.5–8.1)

## 2021-03-17 LAB — LIPASE, BLOOD: Lipase: 38 U/L (ref 11–51)

## 2021-03-17 LAB — URINALYSIS, ROUTINE W REFLEX MICROSCOPIC
Bilirubin Urine: NEGATIVE
Glucose, UA: NEGATIVE mg/dL
Hgb urine dipstick: NEGATIVE
Ketones, ur: NEGATIVE mg/dL
Leukocytes,Ua: NEGATIVE
Nitrite: NEGATIVE
Protein, ur: NEGATIVE mg/dL
Specific Gravity, Urine: 1.016 (ref 1.005–1.030)
pH: 5.5 (ref 5.0–8.0)

## 2021-03-17 LAB — CBC
HCT: 42.5 % (ref 36.0–46.0)
Hemoglobin: 13.4 g/dL (ref 12.0–15.0)
MCH: 29.9 pg (ref 26.0–34.0)
MCHC: 31.5 g/dL (ref 30.0–36.0)
MCV: 94.9 fL (ref 80.0–100.0)
Platelets: 295 10*3/uL (ref 150–400)
RBC: 4.48 MIL/uL (ref 3.87–5.11)
RDW: 14.7 % (ref 11.5–15.5)
WBC: 11.4 10*3/uL — ABNORMAL HIGH (ref 4.0–10.5)
nRBC: 0 % (ref 0.0–0.2)

## 2021-03-17 MED ORDER — IOHEXOL 300 MG/ML  SOLN
100.0000 mL | Freq: Once | INTRAMUSCULAR | Status: AC | PRN
  Administered 2021-03-17: 100 mL via INTRAVENOUS

## 2021-03-17 MED ORDER — IOHEXOL 300 MG/ML  SOLN: 80.0000 mL | Freq: Once | INTRAMUSCULAR | Status: DC | PRN

## 2021-03-17 MED ORDER — LEVOFLOXACIN 500 MG PO TABS: 500.0000 mg | ORAL_TABLET | Freq: Every day | ORAL | 0 refills | Status: AC

## 2021-03-17 MED ORDER — SODIUM CHLORIDE 0.9 % IV BOLUS
1000.0000 mL | Freq: Once | INTRAVENOUS | Status: AC
  Administered 2021-03-17: 1000 mL via INTRAVENOUS

## 2021-03-17 NOTE — ED Provider Notes (Signed)
[facility] EMERGENCY DEPT Provider Note   CSN: [id number] Arrival date & time: 03/17/21  1252     History  Chief Complaint  Patient presents with   Diarrhea    [name] is a 61 y.o. female.  Past medical history of HLD, hypertension, coronary artery disease, OSA, diastolic heart failure, morbid obesity, COPD, CKD stage III, IBS, diverticulitis.   This patient presents emergency department with a chief complaint of diarrhea.  She states that on Sunday she was in her normal state of health when she ate a piece of chicken.  She states that at 30 minutes after this she was having diarrhea all night.  She states that on Monday she started to feel better, however she decided to eat the chicken again.  Immediately after this, she also started to have diarrhea.  She has since continued to have diarrhea for the rest of the week and the last time she did was this morning.  She states that originally the color of the diarrhea was yellowish, brownish but it is now black.  She has a gastroenterology doctor that she goes to who she is called.  She called them again today saying that she is continuing to have diarrhea and they referred her to the emergency department for consideration of antibiotics.  Patient states that she has been having diarrhea around 5-6 times a day.  She has not been eating or drinking a lot as every time she eats or drinks she has more diarrhea.  She feels a little bit weaker than normal.  She denies any chest pain or shortness of breath.  She denies fevers or chills.  She does have some generalized abdominal pain.   Diarrhea Associated symptoms: abdominal pain   Associated symptoms: no chills, no fever and no vomiting       Home Medications Prior to Admission medications   Medication Sig Start Date End Date Taking? Authorizing Provider  levofloxacin (LEVAQUIN) 500 MG tablet Take 1 tablet (500 mg total) by mouth daily for 5 days. 03/17/21 03/22/21 Yes  Akeylah Hendel, Adora Fridge, PA-C  acetaminophen (TYLENOL 8 HOUR) 650 MG CR tablet Take 1 tablet (650 mg total) by mouth every 8 (eight) hours as needed for pain. 12/18/19   Suzy Bouchard, PA-C  Azelastine-Fluticasone 137-50 MCG/ACT SUSP Place 137 mcg into the nose in the morning, at noon, and at bedtime.    [provider]  benzonatate (TESSALON) 100 MG capsule Take 100 mg by mouth 3 (three) times daily as needed for cough. 10/09/20   [provider]  carvedilol (COREG) 12.5 MG tablet Take 1 tablet (12.5 mg total) by mouth 2 (two) times daily with a meal. 04/19/20   Skeet Latch, MD  dexlansoprazole (DEXILANT) 60 MG capsule Take 1 capsule (60 mg total) by mouth daily. Daily in the morning 02/13/21   Nandigam, Venia Minks, MD  dicyclomine (BENTYL) 10 MG capsule Take 1 capsule (10 mg total) by mouth every 8 (eight) hours as needed for spasms. 04/24/20   Mauri Pole, MD  EPINEPHrine 0.3 mg/0.3 mL IJ SOAJ injection Inject 0.3 mg into the muscle as needed for anaphylaxis. 12/19/19   [provider]  ergocalciferol (VITAMIN D2) 1.25 MG (50000 UT) capsule Take 50,000 Units by mouth once a week. Monday's 05/18/20   [provider]  FARXIGA 10 MG TABS tablet Take 10 mg by mouth daily. 10/18/20   [provider]  fluticasone (FLONASE) 50 MCG/ACT nasal spray Place 1 spray  into the nose daily. 03/03/20   [provider]  guaiFENesin-codeine (ROBITUSSIN AC) 100-10 MG/5ML syrup Take 5 mLs by mouth 3 (three) times daily as needed for cough. 03/06/21   Quintella Reichert, MD  hydrALAZINE (APRESOLINE) 25 MG tablet TAKE 1/2 OF A TABLET (12.5 MG TOTAL) BY MOUTH 3 TIMES A DAY 02/26/21   Skeet Latch, MD  ipratropium (ATROVENT) 0.06 % nasal spray Place 2 sprays into both nostrils 3 (three) times daily.    [provider]  ipratropium-albuterol (DUONEB) 0.5-2.5 (3) MG/3ML SOLN Take 3 mLs by nebulization in the morning, at noon, in the evening, and at bedtime.     [provider]  isosorbide dinitrate (ISORDIL) 20 MG tablet Take 1 tablet (20 mg total) by mouth 3 (three) times daily. 04/19/20   Skeet Latch, MD  levocetirizine (XYZAL) 5 MG tablet Take 5 mg by mouth every evening.    [provider]  LINZESS 290 MCG CAPS capsule TAKE 1 CAPSULE BY MOUTH EVERY DAY BEFORE BREAKFAST 01/23/21   Zehr, Janett Billow D, PA-C  losartan (COZAAR) 50 MG tablet TAKE 1 TABLET BY MOUTH EVERY DAY 01/16/21   Skeet Latch, MD  meclizine (ANTIVERT) 25 MG tablet Take 25 mg by mouth 2 (two) times daily as needed for dizziness.    [provider]  meloxicam (MOBIC) 15 MG tablet Take 15 mg by mouth daily. 11/20/20   [provider]  metolazone (ZAROXOLYN) 2.5 MG tablet Take 1 tablet (2.5 mg total) by mouth daily as needed (FOR OVERNIGHT WEIGHT GAIN OF 3 lbs. or more). 02/28/19   Skeet Latch, MD  montelukast (SINGULAIR) 10 MG tablet TAKE 1 TABLET(10 MG) BY MOUTH AT BEDTIME Patient taking differently: Take 10 mg by mouth at bedtime. 07/25/17   Tanda Rockers, MD  Potassium Chloride ER 20 MEQ TBCR TAKE 3 TABLETS BY MOUTH 3 TIMES A DAY 01/02/21   Skeet Latch, MD  Respiratory Therapy Supplies (FLUTTER) DEVI 1 application by Does not apply route as directed. 12/27/16   Tanda Rockers, MD  rosuvastatin (CRESTOR) 20 MG tablet TAKE 1 TABLET BY MOUTH EVERY DAY 01/23/21   Skeet Latch, MD  Semaglutide, 1 MG/DOSE, (OZEMPIC, 1 MG/DOSE,) 4 MG/3ML SOPN AFTER YOU FINISH FIRST 8 WEEKS INJECT 1 MG WEEKLY FOR 4 WEEKS THEN INCREASE TO 2 MG WEEKLY 02/21/21   Skeet Latch, MD  Semaglutide,0.25 or 0.5MG /DOS, (OZEMPIC, 0.25 OR 0.5 MG/DOSE,) 2 MG/1.5ML SOPN INJECT 0.25 MG WEEKLY FOR 4 WEEKS THEN INCREASE TO 0.5 MG WEEKLY FOR 4 WEEKS 02/21/21   Skeet Latch, MD  spironolactone (ALDACTONE) 25 MG tablet TAKE 1/2 TABLETS BY MOUTH 2 TIMES DAILY. 12/25/20   Skeet Latch, MD  topiramate (TOPAMAX) 25 MG tablet Take 25 mg by mouth 2 (two) times daily as needed  (for migraines).     [provider]  torsemide (DEMADEX) 20 MG tablet TAKE 2 TABLETS BY MOUTH 2 TIMES DAILY. Patient taking differently: Take 40 mg by mouth 2 (two) times daily. 07/25/20   Skeet Latch, MD  traMADol (ULTRAM) 50 MG tablet Take 1 tablet (50 mg total) by mouth 3 (three) times daily as needed for moderate pain. 01/19/20   Deneise Lever, MD  TRELEGY ELLIPTA 200-62.5-25 MCG/INH AEPB Inhale 1 puff into the lungs daily. 05/23/20   [provider]  vitamin C (ASCORBIC ACID) 250 MG tablet Take 250 mg by mouth daily.    [provider]      Allergies    Sulfonamide derivatives, Doxycycline, Latex, Ciprofloxacin,  Fish allergy, Hydrocortisone, and Neomycin    Review of Systems   Review of Systems  Constitutional:  Positive for appetite change. Negative for chills and fever.  Respiratory:  Negative for shortness of breath.   Cardiovascular:  Negative for chest pain.  Gastrointestinal:  Positive for abdominal pain and diarrhea. Negative for abdominal distention, anal bleeding, blood in stool, constipation, nausea and vomiting.  Neurological:  Positive for weakness.  All other systems reviewed and are negative.  Physical Exam Updated Vital Signs BP 133/62 (BP Location: Right Wrist)    Pulse 82    Temp 98.3 F (36.8 C) (Oral)    Resp (!) 22    Ht 5\' 3"  (1.6 m)    Wt 133.8 kg    SpO2 100%    BMI 52.26 kg/m  Physical Exam Vitals and nursing note reviewed.  Constitutional:      General: She is not in acute distress.    Appearance: Normal appearance. She is not ill-appearing, toxic-appearing or diaphoretic.  HENT:     Head: Normocephalic and atraumatic.     Nose: No nasal deformity.     Mouth/Throat:     Lips: Pink. No lesions.     Mouth: Mucous membranes are moist. No injury, lacerations, oral lesions or angioedema.     Pharynx: Oropharynx is clear. Uvula midline. No pharyngeal swelling, oropharyngeal exudate, posterior oropharyngeal erythema or uvula  swelling.  Eyes:     General: Gaze aligned appropriately. No scleral icterus.       Right eye: No discharge.        Left eye: No discharge.     Conjunctiva/sclera: Conjunctivae normal.     Right eye: Right conjunctiva is not injected. No exudate or hemorrhage.    Left eye: Left conjunctiva is not injected. No exudate or hemorrhage.    Pupils: Pupils are equal, round, and reactive to light.  Cardiovascular:     Rate and Rhythm: Normal rate and regular rhythm.     Pulses: Normal pulses.          Radial pulses are 2+ on the right side and 2+ on the left side.       Dorsalis pedis pulses are 2+ on the right side and 2+ on the left side.     Heart sounds: Normal heart sounds, S1 normal and S2 normal. Heart sounds not distant. No murmur heard.   No friction rub. No gallop. No S3 or S4 sounds.  Pulmonary:     Effort: Pulmonary effort is normal. No accessory muscle usage or respiratory distress.     Breath sounds: Normal breath sounds. No stridor. No wheezing, rhonchi or rales.  Chest:     Chest wall: No tenderness.  Abdominal:     General: Abdomen is flat. Bowel sounds are normal. There is no distension.     Palpations: Abdomen is soft. There is no mass or pulsatile mass.     Tenderness: There is abdominal tenderness. There is no right CVA tenderness, left CVA tenderness, guarding or rebound.     Comments: There is tenderness in the umbilical and left side of her abdomen.  She does have some associated guarding that may be voluntary.  Right side of abdomen is soft and nontender.  There is no distention.  Musculoskeletal:     Right lower leg: No edema.     Left lower leg: No edema.  Skin:    General: Skin is warm and dry.     Coloration: Skin is not  jaundiced or pale.     Findings: No bruising, erythema, lesion or rash.  Neurological:     General: No focal deficit present.     Mental Status: She is alert and oriented to person, place, and time.     GCS: GCS eye subscore is 4. GCS verbal  subscore is 5. GCS motor subscore is 6.  Psychiatric:        Mood and Affect: Mood normal.        Behavior: Behavior normal. Behavior is cooperative.    ED Results / Procedures / Treatments   Labs (all labs ordered are listed, but only abnormal results are displayed) Labs Reviewed  COMPREHENSIVE METABOLIC PANEL - Abnormal; Notable for the following components:      Result Value   Creatinine, Ser 1.13 (*)    Total Protein 8.4 (*)    GFR, Estimated 55 (*)    All other components within normal limits  CBC - Abnormal; Notable for the following components:   WBC 11.4 (*)    All other components within normal limits  LIPASE, BLOOD  URINALYSIS, ROUTINE W REFLEX MICROSCOPIC    EKG None  Radiology CT Abdomen Pelvis W Contrast  Result Date: 03/17/2021 CLINICAL DATA:  Abdominal pain and diarrhea for 6 days. EXAM: CT ABDOMEN AND PELVIS WITH CONTRAST TECHNIQUE: Multidetector CT imaging of the abdomen and pelvis was performed using the standard protocol following bolus administration of intravenous contrast. RADIATION DOSE REDUCTION: This exam was performed according to the departmental dose-optimization program which includes automated exposure control, adjustment of the mA and/or kV according to patient size and/or use of iterative reconstruction technique. CONTRAST:  151mL OMNIPAQUE IOHEXOL 300 MG/ML  SOLN COMPARISON:  Noncontrast CT on 12/18/2019 FINDINGS: Lower Chest: No acute findings. Hepatobiliary: No hepatic masses identified. Gallbladder is unremarkable. No evidence of biliary ductal dilatation. Pancreas:  No mass or inflammatory changes. Spleen: Within normal limits in size and appearance. Adrenals/Urinary Tract: No masses identified. No evidence of ureteral calculi or hydronephrosis. Stomach/Bowel: Small hiatal hernia noted. Small umbilical hernia is also seen, which contains only fat. No evidence of obstruction, inflammatory process or abnormal fluid collections. Vascular/Lymphatic: No  pathologically enlarged lymph nodes. No acute vascular findings. Reproductive: Prior hysterectomy noted. Adnexal regions are unremarkable in appearance.3 Other:  None. Musculoskeletal:  No suspicious bone lesions identified. IMPRESSION: No acute findings within the abdomen or pelvis. Small hiatal hernia. Small umbilical hernia, which contains only fat. Electronically Signed   By: Marlaine Hind M.D.   On: 03/17/2021 15:02    Procedures Procedures  Patient is on telemetry Medications Ordered in ED Medications  sodium chloride 0.9 % bolus 1,000 mL (1,000 mLs Intravenous New Bag/Given 03/17/21 1420)  iohexol (OMNIPAQUE) 300 MG/ML solution 100 mL (100 mLs Intravenous Contrast Given 03/17/21 1437)    ED Course/ Medical Decision Making/ A&P                           Medical Decision Making Problems Addressed: Diarrhea of presumed infectious origin: acute illness or injury with systemic symptoms  Amount and/or Complexity of Data Reviewed External Data Reviewed: notes. Labs: ordered. Decision-making details documented in ED Course. Radiology: ordered and independent interpretation performed. Decision-making details documented in ED Course.  Risk Prescription drug management.   This is a 61 y.o. female with a pertinent PMH of HLD, hypertension, coronary artery disease, OSA, diastolic heart failure, morbid obesity, COPD, CKD stage III, IBS, diverticulitis who presents to the ED  with diarrhea for 6 days and associated abdominal pain.  Initial Impression: Patient has normal vitals and is afebrile here.  Exam only notable for some left sided abdominal tenderness with some voluntary guarding.  Patient does not appear overtly dehydrated.  Plan to obtain labs and urine to assess for dehydration status.  We will also obtain CT abdomen pelvis since patient has pretty significant tenderness to palpation and rule out diverticulitis as patient has a positive history of this.  Ddx: viral GI infection, bacterial  GI infection, Diverticulitis, Medication induced, Enteritis, etc.  Additional History:  Additional history obtained from n/a External records from outside source obtained and reviewed including recent telephone notes from GI Social Determinants of Health: n/a    I personally reviewed and interpreted all laboratory work and imaging . I agree with radiologist interpretation. Abnormal results outlined below. Creat 1.13 stable, wbc 11.4, urine normal, lipase normal CT:  No acute findings within the abdomen or pelvis.     Small hiatal hernia. Small umbilical hernia, which contains only  fat.    Medications:   I ordered medication including IVF  for hydration Reevaluation of the patient after these medicines showed that the patient improved I have reviewed the patients home medicines and have made adjustments as needed  My Impression: NO acute findings found on imaging. Labs are overall nonspecific. No severe dehydration is noted. Likely infectious diarrhea. After reading GI notes, and since patient has been having symptoms for about a week now, will do short course of abx and f/u with GI outpatient   Disposition:  After consideration of the diagnostic results and the patients response to treatment, I feel that the patent would benefit from outpatient f/u.  Portions of this note were generated with [employer]. Dictation errors may occur despite best attempts at proofreading.    Final Clinical Impression(s) / ED Diagnoses Final diagnoses:  Diarrhea of presumed infectious origin    Rx / DC Orders ED Discharge Orders          Ordered    levofloxacin (LEVAQUIN) 500 MG tablet  Daily        03/17/21 1538              Tenesha Garza, Adora Fridge, PA-C 38/75/64 [id number]    Lianne Cure, DO 51/88/41 [phone]

## 2021-03-17 NOTE — ED Notes (Signed)
Dc instructions reviewed with patient. Patient voiced understanding. Dc with belongings.  °

## 2021-03-17 NOTE — Discharge Instructions (Addendum)
Your CT image and labs were overall reassuring.  Please follow up with your gastrointestinal provider. I am going to treat you prophylactic with antibiotics since your symptoms have been going on for so long.   You should return to the ED if you develop worsening or concerning symptoms

## 2021-03-17 NOTE — Telephone Encounter (Signed)
Patient called today, stating that she is still having persistent diarrhea, at least 5 times per day, pure water with foul odor, no bleeding.  She is nauseated, not eating well but has not been vomiting.  Not certain without fever.  Mentions that her eyes are very bloodshot and red, feeling poorly in general History of congestive heart failure Advise she go to one of the emergency room's for evaluation she has been sick over the past 6 days with current symptoms and has not seen any improvement

## 2021-03-17 NOTE — ED Notes (Signed)
Patient transported to CT 

## 2021-03-17 NOTE — ED Triage Notes (Signed)
Pt arrives POV w/ c/o abdominal pain and diarrhea x6 days.  Pain and diarrhea started after eating chicken last Sunday.  Pt reports multiple episodes of diarrhea daily, after any eating or drinking.  States diarrhea is pure liquid and is black/green in color, with a foul odor.

## 2021-03-19 ENCOUNTER — Telehealth: Payer: Self-pay | Admitting: Gastroenterology

## 2021-03-19 NOTE — Telephone Encounter (Signed)
Diarrhea likely secondary to antibiotics. Continue antibiotics and supportive treatment. Call if symptoms worsen

## 2021-03-19 NOTE — Telephone Encounter (Signed)
Patient calls with an update from her ER visit this weekend. Contacting us was part of her discharge instructions.  She was given fluids and started on Levoquin 500 mg x 5 days. She is on day 3. She has had decrease in her stools to 2 so far today. States these are still watery diarrhea stools. Patient complains of abdominal pain "in the front" "near the belly button." Reports she is maintaining hydration. She was told by her prescribing provider to hold Ozempic 03/16/21. Due to inject this on 03/23/21.  Is there anything she needs to do?

## 2021-03-19 NOTE — Telephone Encounter (Signed)
Please call patient. Thank you.  

## 2021-03-26 ENCOUNTER — Telehealth: Payer: Self-pay | Admitting: Cardiovascular Disease

## 2021-03-26 NOTE — Telephone Encounter (Signed)
RN called and updated patient who verbalizes understanding  ? ?Per Rollen Sox,  ?"Recommend she inject two more weeks of 0.'25mg'$  dose and then increase to 0.'5mg'$ "  ?

## 2021-03-26 NOTE — Telephone Encounter (Signed)
Please advise 

## 2021-03-26 NOTE — Telephone Encounter (Signed)
Pt c/o medication issue: ? ?1. Name of Medication: Semaglutide,0.25 or 0.'5MG'$ /DOS, (OZEMPIC, 0.25 OR 0.5 MG/DOSE,) 2 MG/1.5ML SOPN ? ?2. How are you currently taking this medication (dosage and times per day)?  ? ?3. Are you having a reaction (difficulty breathing--STAT)? no ? ?4. What is your medication issue? Patient is not sure what dosage to give herself next. Patient missed one dose because she had food poisoning. Patient went to get back on track and the device did not have any medication. She got her new rx and was told to up her dosage to 0.5 mg but only had two doses at 0.25 mg. She is not sure what to do next  ?

## 2021-03-26 NOTE — Telephone Encounter (Signed)
Recommend she inject two more weeks of 0.'25mg'$  dose and then increase to 0.'5mg'$  ?

## 2021-04-05 ENCOUNTER — Other Ambulatory Visit (HOSPITAL_BASED_OUTPATIENT_CLINIC_OR_DEPARTMENT_OTHER): Payer: Self-pay | Admitting: Cardiovascular Disease

## 2021-04-05 NOTE — Telephone Encounter (Signed)
Rx(s) sent to pharmacy electronically.  

## 2021-04-09 ENCOUNTER — Ambulatory Visit (INDEPENDENT_AMBULATORY_CARE_PROVIDER_SITE_OTHER): Payer: 59 | Admitting: Gastroenterology

## 2021-04-09 ENCOUNTER — Encounter: Payer: Self-pay | Admitting: Gastroenterology

## 2021-04-09 ENCOUNTER — Telehealth: Payer: Self-pay | Admitting: Gastroenterology

## 2021-04-09 VITALS — BP 124/70 | HR 104 | Ht 63.0 in | Wt 289.0 lb

## 2021-04-09 DIAGNOSIS — R109 Unspecified abdominal pain: Secondary | ICD-10-CM

## 2021-04-09 DIAGNOSIS — K219 Gastro-esophageal reflux disease without esophagitis: Secondary | ICD-10-CM

## 2021-04-09 DIAGNOSIS — K581 Irritable bowel syndrome with constipation: Secondary | ICD-10-CM

## 2021-04-09 NOTE — Telephone Encounter (Signed)
Inbound call from patient. Would like to a call back to discuss if blood work is needed ?

## 2021-04-09 NOTE — Telephone Encounter (Signed)
Yes patient needed labs today  CBC and BMET. She will come back tomorrow.. She is doing her bowel purge this evening  ?

## 2021-04-09 NOTE — Patient Instructions (Signed)
Dr Silverio Decamp recommends that you complete a bowel purge (to clean out your bowels). Please do the following: ?Purchase a bottle of Miralax over the counter as well as a box of 5 mg dulcolax tablets. ?Take 4 dulcolax tablets. ?Wait 1 hour. ?You will then drink 6-8 capfuls of Miralax mixed in an adequate amount of water/juice/gatorade (you may choose which of these liquids to drink) over the next 2-3 hours. ?You should expect results within 1 to 6 hours after completing the bowel purge.  ? ?Continue Linzess 290 mcg daily, plus Miralax 1 capful daily ? ?Continue Dexilant  ? ?Due to recent changes in healthcare laws, you may see the results of your imaging and laboratory studies on MyChart before your provider has had a chance to review them.  We understand that in some cases there may be results that are confusing or concerning to you. Not all laboratory results come back in the same time frame and the provider may be waiting for multiple results in order to interpret others.  Please give [place] 48 hours in order for your provider to thoroughly review all the results before contacting the office for clarification of your results.   ? ?If you are age 36 or older, your body mass index should be between 23-30. Your Body mass index is 51.19 kg/m?[name] If this is out of the aforementioned range listed, please consider follow up with your Primary Care Provider. ? ?If you are age 27 or younger, your body mass index should be between 19-25. Your Body mass index is 51.19 kg/m?[name] If this is out of the aformentioned range listed, please consider follow up with your Primary Care Provider.  ? ?________________________________________________________ ? ?The [facility] providers would like to encourage you to use [facility] to communicate with providers for non-urgent requests or questions.  Due to long hold times on the telephone, sending your provider a message by [facility] may be a faster and more efficient way to get a response.  Please allow  48 business hours for a response.  Please remember that this is for non-urgent requests.  ?_______________________________________________________  ? ?I appreciate the  opportunity to care for you ? ?Thank You  ? ?Harl Bowie , MD  ?

## 2021-04-09 NOTE — Telephone Encounter (Signed)
[name] this pt was seen today by Dr Silverio Decamp.  I do not see where any blood work was ordered.  I will let you look to make sure nothing was mentioned at her office visit.  ?

## 2021-04-09 NOTE — Progress Notes (Signed)
? ?       ? ?CALAH GERSHMAN    [id number]    Mar 20, 1960 ? ?Primary Care Physician:Boyd, Dola Factor, MD ? ?Referring Physician: Bartholome Bill, MD ?[address] ?[address],  [place] [address] ? ? ?Chief complaint:  GERD, constipation ? ?HPI: ?61 year old very pleasant female with history of morbid obesity, obstructive sleep apnea, COPD here for follow-up visit for constipation and GERD. ? ?Patient complains of left-sided abdominal pain for the past week.  She has not had a bowel movement in the past 2 days ? ?She is taking Linzess 290 mcg daily with bowel movements every other day.  She is not taking MiraLAX because when she took her usual dose of MiraLAX 3 capfuls daily along with Linzess she was having diarrhea ? ?She was in the ER 2 weeks ago with complaints of abdominal pain and diarrhea, was thought to be due to food poisoning and was treated with levofloxacin ?  ?She restarted Ozempic last week, took a dose on Tuesday ? ?Relevant GI history: ?EGD 12/09/18 ?- The esophagus was normal. ?- A large amount of a phytobezoar was found in the gastric body with no evidence of gastric ?outlet obstruction. Limited visualization of the gastric mucosa ?- The examined duodenum was normal. ?  ?EGD July 28, 2018: Few superficial linear nonbleeding gastric ulcers in the antrum and gastritis.  Benign fundic gland polyps.  Esophagus and duodenum was normal. ?  ?EGD June 22, 2018 with moderate amount of residue food in the body and fundus of stomach with limited visualization of the lining, gastritis, biopsies negative for H. pylori ?  ?Gastric emptying scan July 08, 2018: Normal ?  ?CT abdomen pelvis March 30, 2018: ?No acute intra-abdominal or pelvic abnormality, moderate fat-containing umbilical hernia ?  ?Colonoscopy April 2019 with removal of 11 mm hamartomatous polyp in sigmoid colon ?  ?Colonoscopy November 04, 2003 normal with no polyps ?  ? ?Outpatient Encounter Medications as of 04/09/2021   ?Medication Sig  ? acetaminophen (TYLENOL 8 HOUR) 650 MG CR tablet Take 1 tablet (650 mg total) by mouth every 8 (eight) hours as needed for pain.  ? Azelastine-Fluticasone 137-50 MCG/ACT SUSP Place 137 mcg into the nose in the morning, at noon, and at bedtime.  ? benzonatate (TESSALON) 100 MG capsule Take 100 mg by mouth 3 (three) times daily as needed for cough.  ? carvedilol (COREG) 12.5 MG tablet TAKE 1 TABLET (12.5 MG TOTAL) BY MOUTH 2 (TWO) TIMES DAILY WITH A MEAL.  ? dexlansoprazole (DEXILANT) 60 MG capsule Take 1 capsule (60 mg total) by mouth daily. Daily in the morning  ? dicyclomine (BENTYL) 10 MG capsule Take 1 capsule (10 mg total) by mouth every 8 (eight) hours as needed for spasms.  ? EPINEPHrine 0.3 mg/0.3 mL IJ SOAJ injection Inject 0.3 mg into the muscle as needed for anaphylaxis.  ? ergocalciferol (VITAMIN D2) 1.25 MG (50000 UT) capsule Take 50,000 Units by mouth once a week. Monday's  ? FARXIGA 10 MG TABS tablet Take 10 mg by mouth daily.  ? fluticasone (FLONASE) 50 MCG/ACT nasal spray Place 1 spray into the nose daily.  ? guaiFENesin-codeine (ROBITUSSIN AC) 100-10 MG/5ML syrup Take 5 mLs by mouth 3 (three) times daily as needed for cough.  ? hydrALAZINE (APRESOLINE) 25 MG tablet TAKE 1/2 OF A TABLET (12.5 MG TOTAL) BY MOUTH 3 TIMES A DAY  ? ipratropium (ATROVENT) 0.06 % nasal spray Place 2 sprays into both nostrils 3 (  three) times daily.  ? ipratropium-albuterol (DUONEB) 0.5-2.5 (3) MG/3ML SOLN Take 3 mLs by nebulization in the morning, at noon, in the evening, and at bedtime.  ? isosorbide dinitrate (ISORDIL) 20 MG tablet Take 1 tablet (20 mg total) by mouth 3 (three) times daily.  ? levocetirizine (XYZAL) 5 MG tablet Take 5 mg by mouth every evening.  ? LINZESS 290 MCG CAPS capsule TAKE 1 CAPSULE BY MOUTH EVERY DAY BEFORE BREAKFAST  ? losartan (COZAAR) 50 MG tablet TAKE 1 TABLET BY MOUTH EVERY DAY  ? meclizine (ANTIVERT) 25 MG tablet Take 25 mg by mouth 2 (two) times daily as needed for  dizziness.  ? meloxicam (MOBIC) 15 MG tablet Take 15 mg by mouth daily.  ? metolazone (ZAROXOLYN) 2.5 MG tablet Take 1 tablet (2.5 mg total) by mouth daily as needed (FOR OVERNIGHT WEIGHT GAIN OF 3 lbs. or more).  ? montelukast (SINGULAIR) 10 MG tablet TAKE 1 TABLET(10 MG) BY MOUTH AT BEDTIME (Patient taking differently: Take 10 mg by mouth at bedtime.)  ? Potassium Chloride ER 20 MEQ TBCR TAKE 3 TABLETS BY MOUTH 3 TIMES A DAY  ? Respiratory Therapy Supplies (FLUTTER) DEVI 1 application by Does not apply route as directed.  ? rosuvastatin (CRESTOR) 20 MG tablet TAKE 1 TABLET BY MOUTH EVERY DAY  ? Semaglutide, 1 MG/DOSE, (OZEMPIC, 1 MG/DOSE,) 4 MG/3ML SOPN AFTER YOU FINISH FIRST 8 WEEKS INJECT 1 MG WEEKLY FOR 4 WEEKS THEN INCREASE TO 2 MG WEEKLY  ? Semaglutide,0.25 or 0.'5MG'$ /DOS, (OZEMPIC, 0.25 OR 0.5 MG/DOSE,) 2 MG/1.5ML SOPN INJECT 0.25 MG WEEKLY FOR 4 WEEKS THEN INCREASE TO 0.5 MG WEEKLY FOR 4 WEEKS  ? spironolactone (ALDACTONE) 25 MG tablet TAKE 1/2 TABLETS BY MOUTH 2 TIMES DAILY.  ? topiramate (TOPAMAX) 25 MG tablet Take 25 mg by mouth 2 (two) times daily as needed (for migraines).   ? torsemide (DEMADEX) 20 MG tablet TAKE 2 TABLETS BY MOUTH 2 TIMES DAILY. (Patient taking differently: Take 40 mg by mouth 2 (two) times daily.)  ? traMADol (ULTRAM) 50 MG tablet Take 1 tablet (50 mg total) by mouth 3 (three) times daily as needed for moderate pain.  ? TRELEGY ELLIPTA 200-62.5-25 MCG/INH AEPB Inhale 1 puff into the lungs daily.  ? vitamin C (ASCORBIC ACID) 250 MG tablet Take 250 mg by mouth daily.  ? ?No facility-administered encounter medications on file as of 04/09/2021.  ? ? ?Allergies as of 04/09/2021 - Review Complete 04/09/2021  ?Allergen Reaction Noted  ? Sulfonamide derivatives Anaphylaxis, Swelling, Rash, and Other (See Comments) 10/21/2006  ? Doxycycline Nausea And Vomiting 01/05/2008  ? Latex Hives 09/28/2011  ? Ciprofloxacin Rash 10/21/2006  ? Fish allergy Rash and Other (See Comments) 03/08/2012  ?  Hydrocortisone Rash   ? Neomycin Rash 10/21/2006  ? ? ?Past Medical History:  ?Diagnosis Date  ? Acute on chronic diastolic CHF (congestive heart failure) ([place]) 04/11/2014  ? Acute suppurative otitis media without spontaneous rupture of eardrum 02/04/2007  ? Centricity Description: OTITIS MEDIA, SUPPURATIVE, ACUTE, BILATERAL Qualifier: Diagnosis of  By: [name] MD, [name]  Centricity Description: OTITIS MEDIA, SUPPURATIVE, ACUTE Qualifier: Diagnosis of  By: [name] MD, Hilliard Clark A   ? Allergic rhinitis   ? Allergy   ? Anemia   ? Anxiety   ? Arthritis   ? Atherosclerosis of aorta ([place]) 02/21/2021  ? CAD (coronary artery disease)   ? LHC (11/29/1999):  EF 60%; no significant CAD.  ? Carpal tunnel syndrome   ? Carpal tunnel syndrome 06/09/2006  ?  Qualifier: Diagnosis of  By: Reatha Armour, Lucy    ? Chronic diastolic CHF (congestive heart failure) ([place])   ? a. Echo (01/21/11): Vigorous LVF, EF 65-70%, no RWMA, Gr 2 DD. b.  Echo (12/15): Moderate LVH, EF 68-03%, grade 2 diastolic dysfunction, mild LAE, normal RV function c. 01/2015: echo showing EF of 60-65% with mild LVH  ? Common migraine   ? COPD (chronic obstructive pulmonary disease) ([place])   ? COVID-19   ? x 2  ? Diastolic heart failure ([place])   ? Diverticulitis   ? Dyspnea   ? GERD (gastroesophageal reflux disease)   ? Hepatic steatosis   ? Hepatomegaly   ? Hiatal hernia   ? History of cardiomegaly   ? Hyperlipidemia   ? Hypertension   ? IBS (irritable bowel syndrome)   ? Internal hemorrhoids   ? Long COVID   ? Metabolic syndrome   ? Morbid obesity ([place])   ? OSA (obstructive sleep apnea)   ? CPAP dependent  ? PNEUMONIA, ORGAN UNSPECIFIED 03/01/2009  ? Qualifier: Diagnosis of  By: [name] CMA, [name]    ? Sinusitis, chronic 03/10/2012  ? CT sinuses 02/2012:  Chronic sinusitis with small A/F levels >> rx by ENT with prolonged augmentin F/u ct sinuses 04/2012:  Persistent sinus thickening >> rx with levaquin and clinda for 30 days.  Told to return for sinus scan in 6 weeks >> no  showed for followup visit.  CT sinuses 11/2013:  Acute and chronic sinusitis noted.    ? Sleep apnea   ? bi pap  ? ? ?Past Surgical History:  ?Procedure Laterality Date  ? ABDOMINAL HYSTERECTOMY    ? BIOPSY  06/22/2018

## 2021-04-10 ENCOUNTER — Other Ambulatory Visit (INDEPENDENT_AMBULATORY_CARE_PROVIDER_SITE_OTHER): Payer: 59

## 2021-04-10 ENCOUNTER — Telehealth: Payer: Self-pay | Admitting: Gastroenterology

## 2021-04-10 ENCOUNTER — Emergency Department (HOSPITAL_BASED_OUTPATIENT_CLINIC_OR_DEPARTMENT_OTHER): Payer: Medicare Other

## 2021-04-10 ENCOUNTER — Other Ambulatory Visit: Payer: Self-pay

## 2021-04-10 ENCOUNTER — Encounter (HOSPITAL_BASED_OUTPATIENT_CLINIC_OR_DEPARTMENT_OTHER): Payer: Self-pay | Admitting: Emergency Medicine

## 2021-04-10 ENCOUNTER — Inpatient Hospital Stay (HOSPITAL_BASED_OUTPATIENT_CLINIC_OR_DEPARTMENT_OTHER)
Admission: EM | Admit: 2021-04-10 | Discharge: 2021-04-13 | DRG: 683 | Disposition: A | Discharge status: Home / Self Care | Payer: Medicare Other | Attending: Internal Medicine | Admitting: Internal Medicine

## 2021-04-10 DIAGNOSIS — I110 Hypertensive heart disease with heart failure: Secondary | ICD-10-CM | POA: Diagnosis present

## 2021-04-10 DIAGNOSIS — K219 Gastro-esophageal reflux disease without esophagitis: Secondary | ICD-10-CM | POA: Diagnosis present

## 2021-04-10 DIAGNOSIS — Z803 Family history of malignant neoplasm of breast: Secondary | ICD-10-CM

## 2021-04-10 DIAGNOSIS — I5032 Chronic diastolic (congestive) heart failure: Secondary | ICD-10-CM | POA: Diagnosis not present

## 2021-04-10 DIAGNOSIS — Z825 Family history of asthma and other chronic lower respiratory diseases: Secondary | ICD-10-CM

## 2021-04-10 DIAGNOSIS — D72829 Elevated white blood cell count, unspecified: Secondary | ICD-10-CM | POA: Diagnosis not present

## 2021-04-10 DIAGNOSIS — Z882 Allergy status to sulfonamides status: Secondary | ICD-10-CM | POA: Diagnosis not present

## 2021-04-10 DIAGNOSIS — Z8371 Family history of colonic polyps: Secondary | ICD-10-CM | POA: Diagnosis not present

## 2021-04-10 DIAGNOSIS — E876 Hypokalemia: Secondary | ICD-10-CM | POA: Diagnosis not present

## 2021-04-10 DIAGNOSIS — Z8616 Personal history of COVID-19: Secondary | ICD-10-CM

## 2021-04-10 DIAGNOSIS — E860 Dehydration: Secondary | ICD-10-CM | POA: Diagnosis not present

## 2021-04-10 DIAGNOSIS — K5900 Constipation, unspecified: Secondary | ICD-10-CM | POA: Diagnosis present

## 2021-04-10 DIAGNOSIS — Z20822 Contact with and (suspected) exposure to covid-19: Secondary | ICD-10-CM | POA: Diagnosis present

## 2021-04-10 DIAGNOSIS — Z6841 Body Mass Index (BMI) 40.0 and over, adult: Secondary | ICD-10-CM

## 2021-04-10 DIAGNOSIS — Z91040 Latex allergy status: Secondary | ICD-10-CM | POA: Diagnosis not present

## 2021-04-10 DIAGNOSIS — Z79899 Other long term (current) drug therapy: Secondary | ICD-10-CM

## 2021-04-10 DIAGNOSIS — I10 Essential (primary) hypertension: Secondary | ICD-10-CM | POA: Diagnosis present

## 2021-04-10 DIAGNOSIS — K581 Irritable bowel syndrome with constipation: Secondary | ICD-10-CM

## 2021-04-10 DIAGNOSIS — Z9109 Other allergy status, other than to drugs and biological substances: Secondary | ICD-10-CM

## 2021-04-10 DIAGNOSIS — R197 Diarrhea, unspecified: Secondary | ICD-10-CM | POA: Diagnosis not present

## 2021-04-10 DIAGNOSIS — N179 Acute kidney failure, unspecified: Secondary | ICD-10-CM | POA: Diagnosis present

## 2021-04-10 DIAGNOSIS — J449 Chronic obstructive pulmonary disease, unspecified: Secondary | ICD-10-CM | POA: Diagnosis present

## 2021-04-10 DIAGNOSIS — Z888 Allergy status to other drugs, medicaments and biological substances status: Secondary | ICD-10-CM

## 2021-04-10 DIAGNOSIS — R109 Unspecified abdominal pain: Secondary | ICD-10-CM | POA: Diagnosis not present

## 2021-04-10 DIAGNOSIS — R112 Nausea with vomiting, unspecified: Secondary | ICD-10-CM | POA: Diagnosis present

## 2021-04-10 DIAGNOSIS — Z881 Allergy status to other antibiotic agents status: Secondary | ICD-10-CM | POA: Diagnosis not present

## 2021-04-10 DIAGNOSIS — E785 Hyperlipidemia, unspecified: Secondary | ICD-10-CM | POA: Diagnosis present

## 2021-04-10 DIAGNOSIS — Z7951 Long term (current) use of inhaled steroids: Secondary | ICD-10-CM

## 2021-04-10 DIAGNOSIS — Z8249 Family history of ischemic heart disease and other diseases of the circulatory system: Secondary | ICD-10-CM

## 2021-04-10 DIAGNOSIS — G4733 Obstructive sleep apnea (adult) (pediatric): Secondary | ICD-10-CM | POA: Diagnosis not present

## 2021-04-10 DIAGNOSIS — I959 Hypotension, unspecified: Secondary | ICD-10-CM | POA: Diagnosis present

## 2021-04-10 LAB — CBC WITH DIFFERENTIAL/PLATELET
Basophils Absolute: 0 10*3/uL (ref 0.0–0.1)
Basophils Relative: 0.2 % (ref 0.0–3.0)
Eosinophils Absolute: 0.2 10*3/uL (ref 0.0–0.7)
Eosinophils Relative: 1.5 % (ref 0.0–5.0)
HCT: 38.5 % (ref 36.0–46.0)
Hemoglobin: 12.9 g/dL (ref 12.0–15.0)
Lymphocytes Relative: 22 % (ref 12.0–46.0)
Lymphs Abs: 3 10*3/uL (ref 0.7–4.0)
MCHC: 33.6 g/dL (ref 30.0–36.0)
MCV: 91.8 fl (ref 78.0–100.0)
Monocytes Absolute: 0.9 10*3/uL (ref 0.1–1.0)
Monocytes Relative: 6.4 % (ref 3.0–12.0)
Neutro Abs: 9.6 10*3/uL — ABNORMAL HIGH (ref 1.4–7.7)
Neutrophils Relative %: 69.9 % (ref 43.0–77.0)
Platelets: 273 10*3/uL (ref 150.0–400.0)
RBC: 4.19 Mil/uL (ref 3.87–5.11)
RDW: 14.4 % (ref 11.5–15.5)
WBC: 13.7 10*3/uL — ABNORMAL HIGH (ref 4.0–10.5)

## 2021-04-10 LAB — COMPREHENSIVE METABOLIC PANEL
ALT: 15 U/L (ref 0–44)
AST: 18 U/L (ref 15–41)
Albumin: 4.1 g/dL (ref 3.5–5.0)
Alkaline Phosphatase: 69 U/L (ref 38–126)
Anion gap: 14 (ref 5–15)
BUN: 46 mg/dL — ABNORMAL HIGH (ref 8–23)
CO2: 28 mmol/L (ref 22–32)
Calcium: 9.5 mg/dL (ref 8.9–10.3)
Chloride: 91 mmol/L — ABNORMAL LOW (ref 98–111)
Creatinine, Ser: 2.06 mg/dL — ABNORMAL HIGH (ref 0.44–1.00)
GFR, Estimated: 27 mL/min — ABNORMAL LOW (ref >60)
Glucose, Bld: 130 mg/dL — ABNORMAL HIGH (ref 70–99)
Potassium: 3 mmol/L — ABNORMAL LOW (ref 3.5–5.1)
Sodium: 133 mmol/L — ABNORMAL LOW (ref 135–145)
Total Bilirubin: 1 mg/dL (ref 0.3–1.2)
Total Protein: 8.1 g/dL (ref 6.5–8.1)

## 2021-04-10 LAB — BASIC METABOLIC PANEL
BUN: 38 mg/dL — ABNORMAL HIGH (ref 6–23)
CO2: 29 mEq/L (ref 19–32)
Calcium: 10 mg/dL (ref 8.4–10.5)
Chloride: 88 mEq/L — ABNORMAL LOW (ref 96–112)
Creatinine, Ser: 1.89 mg/dL — ABNORMAL HIGH (ref 0.40–1.20)
GFR: 28.15 mL/min — ABNORMAL LOW (ref >60.00)
Glucose, Bld: 116 mg/dL — ABNORMAL HIGH (ref 70–99)
Potassium: 2.4 mEq/L — CL (ref 3.5–5.1)
Sodium: 132 mEq/L — ABNORMAL LOW (ref 135–145)

## 2021-04-10 LAB — MAGNESIUM: Magnesium: 2.2 mg/dL (ref 1.7–2.4)

## 2021-04-10 LAB — LIPASE, BLOOD: Lipase: 25 U/L (ref 11–51)

## 2021-04-10 MED ORDER — TRAMADOL HCL 50 MG PO TABS
50.0000 mg | ORAL_TABLET | Freq: Three times a day (TID) | ORAL | Status: DC | PRN
  Administered 2021-04-11 – 2021-04-13 (×5): 50 mg via ORAL
  Filled 2021-04-10 (×5): qty 1

## 2021-04-10 MED ORDER — ACETAMINOPHEN 650 MG RE SUPP: 650.0000 mg | Freq: Four times a day (QID) | RECTAL | Status: DC | PRN

## 2021-04-10 MED ORDER — HYDRALAZINE HCL 25 MG PO TABS
12.5000 mg | ORAL_TABLET | Freq: Three times a day (TID) | ORAL | Status: DC
  Administered 2021-04-11 – 2021-04-13 (×7): 12.5 mg via ORAL
  Filled 2021-04-10 (×7): qty 1

## 2021-04-10 MED ORDER — POTASSIUM CHLORIDE CRYS ER 20 MEQ PO TBCR
40.0000 meq | EXTENDED_RELEASE_TABLET | Freq: Once | ORAL | Status: AC
  Administered 2021-04-10: 40 meq via ORAL
  Filled 2021-04-10: qty 2

## 2021-04-10 MED ORDER — UMECLIDINIUM BROMIDE 62.5 MCG/ACT IN AEPB
1.0000 | INHALATION_SPRAY | Freq: Every day | RESPIRATORY_TRACT | Status: DC
Start: 2021-04-11 — End: 2021-04-13
  Administered 2021-04-11 – 2021-04-13 (×3): 1 via RESPIRATORY_TRACT
  Filled 2021-04-10: qty 7

## 2021-04-10 MED ORDER — ROSUVASTATIN CALCIUM 20 MG PO TABS
20.0000 mg | ORAL_TABLET | Freq: Every day | ORAL | Status: DC
  Administered 2021-04-11 – 2021-04-13 (×3): 20 mg via ORAL
  Filled 2021-04-10 (×3): qty 1

## 2021-04-10 MED ORDER — MONTELUKAST SODIUM 10 MG PO TABS
10.0000 mg | ORAL_TABLET | Freq: Every day | ORAL | Status: DC
  Administered 2021-04-11 – 2021-04-12 (×3): 10 mg via ORAL
  Filled 2021-04-10 (×3): qty 1

## 2021-04-10 MED ORDER — ENOXAPARIN SODIUM 40 MG/0.4ML IJ SOSY: 40.0000 mg | PREFILLED_SYRINGE | INTRAMUSCULAR | Status: DC

## 2021-04-10 MED ORDER — LACTATED RINGERS IV SOLN
INTRAVENOUS | Status: AC
Start: 2021-04-11 — End: 2021-04-12

## 2021-04-10 MED ORDER — SODIUM CHLORIDE 0.9 % IV SOLN: INTRAVENOUS | Status: DC

## 2021-04-10 MED ORDER — ISOSORBIDE DINITRATE 20 MG PO TABS
20.0000 mg | ORAL_TABLET | Freq: Three times a day (TID) | ORAL | Status: DC
  Administered 2021-04-11 – 2021-04-13 (×8): 20 mg via ORAL
  Filled 2021-04-10 (×9): qty 1

## 2021-04-10 MED ORDER — SODIUM CHLORIDE 0.9 % IV BOLUS
500.0000 mL | Freq: Once | INTRAVENOUS | Status: AC
  Administered 2021-04-10: 500 mL via INTRAVENOUS

## 2021-04-10 MED ORDER — FLUTICASONE FUROATE-VILANTEROL 200-25 MCG/ACT IN AEPB
1.0000 | INHALATION_SPRAY | Freq: Every day | RESPIRATORY_TRACT | Status: DC
  Administered 2021-04-11 – 2021-04-13 (×3): 1 via RESPIRATORY_TRACT
  Filled 2021-04-10: qty 28

## 2021-04-10 MED ORDER — LINACLOTIDE 145 MCG PO CAPS
290.0000 ug | ORAL_CAPSULE | Freq: Every day | ORAL | Status: DC
  Filled 2021-04-10: qty 2

## 2021-04-10 MED ORDER — POTASSIUM CHLORIDE 10 MEQ/100ML IV SOLN
10.0000 meq | INTRAVENOUS | Status: AC
  Administered 2021-04-10 (×3): 10 meq via INTRAVENOUS
  Filled 2021-04-10 (×3): qty 100

## 2021-04-10 MED ORDER — ACETAMINOPHEN 325 MG PO TABS
650.0000 mg | ORAL_TABLET | Freq: Four times a day (QID) | ORAL | Status: DC | PRN
  Administered 2021-04-11 – 2021-04-13 (×5): 650 mg via ORAL
  Filled 2021-04-10 (×5): qty 2

## 2021-04-10 MED ORDER — CARVEDILOL 12.5 MG PO TABS
12.5000 mg | ORAL_TABLET | Freq: Two times a day (BID) | ORAL | Status: DC
  Administered 2021-04-11 – 2021-04-13 (×6): 12.5 mg via ORAL
  Filled 2021-04-10 (×6): qty 1

## 2021-04-10 NOTE — ED Triage Notes (Signed)
Pt went to her GI doctor yesterday for a blockage. Pt received a call from the doctors office related to blood work taken today. Pt was advised to go to the ED for low K.   ?

## 2021-04-10 NOTE — Telephone Encounter (Signed)
Please send to Beth's box this is a Dr Silverio Decamp pt.  I was just helping her yesterday.  ?

## 2021-04-10 NOTE — ED Notes (Signed)
Report given to the RN on floor. ?

## 2021-04-10 NOTE — Telephone Encounter (Signed)
Chong Sicilian will you please see the note below and advise.  ?

## 2021-04-10 NOTE — ED Notes (Signed)
Report given to [facility] 

## 2021-04-10 NOTE — ED Notes (Signed)
Report attempted to the Floor RN. They stated that they were unable to take report because they were in another room. They were notified that [facility] may arrival to their facility before report could be given if they don't take it now. ?

## 2021-04-10 NOTE — ED Provider Notes (Signed)
?[facility] ?Provider Note ? ? ?CSN: [id number] ?Arrival date & time: 04/10/21  1656 ? ?  ? ?History ? ?Chief Complaint  ?Patient presents with  ? Flank Pain  ? ? ?[name] is a 61 y.o. female with history of morbid obesity, obstructive sleep apnea, COPD, GERD and constipation who presents to the ED at the referral of her GI doctor for hypokalemia of 2.4.  Patient follows with Dr. Rush Landmark and was seen at her office yesterday for left-sided abdominal pain x1 week and 2-day history of constipation.  Per chart review, abdominal pain was secondary to increase stool burden and started patient on a bowel purge with MiraLAX along with her usual medications of Linzess 290 mcg.  Patient started having significant diarrhea and also began vomiting.  Labs were checked and notable for a potassium of 2.4, prompting referral to the emergency department.  Currently, patient denies nausea, vomiting.  She does have bilateral cramping pain of her abdomen and admits to muscle fatigue/heaviness.  She denies palpitations, chest pain, numbness and tingling. ? ? ?Flank Pain ? ? ?  ? ?Home Medications ?Prior to Admission medications   ?Medication Sig Start Date End Date Taking? Authorizing Provider  ?acetaminophen (TYLENOL 8 HOUR) 650 MG CR tablet Take 1 tablet (650 mg total) by mouth every 8 (eight) hours as needed for pain. 12/18/19   Suzy Bouchard, PA-C  ?Azelastine-Fluticasone 137-50 MCG/ACT SUSP Place 137 mcg into the nose in the morning, at noon, and at bedtime.    [provider]  ?benzonatate (TESSALON) 100 MG capsule Take 100 mg by mouth 3 (three) times daily as needed for cough. 10/09/20   [provider]  ?carvedilol (COREG) 12.5 MG tablet TAKE 1 TABLET (12.5 MG TOTAL) BY MOUTH 2 (TWO) TIMES DAILY WITH A MEAL. 04/05/21   Skeet Latch, MD  ?dexlansoprazole (DEXILANT) 60 MG capsule Take 1 capsule (60 mg total) by mouth daily. Daily in the morning 02/13/21   Mauri Pole, MD  ?dicyclomine (BENTYL) 10 MG capsule Take 1 capsule (10 mg total) by mouth every 8 (eight) hours as needed for spasms. 04/24/20   Mauri Pole, MD  ?EPINEPHrine 0.3 mg/0.3 mL IJ SOAJ injection Inject 0.3 mg into the muscle as needed for anaphylaxis. 12/19/19   [provider]  ?ergocalciferol (VITAMIN D2) 1.25 MG (50000 UT) capsule Take 50,000 Units by mouth once a week. Monday's 05/18/20   [provider]  ?FARXIGA 10 MG TABS tablet Take 10 mg by mouth daily. 10/18/20   [provider]  ?fluticasone (FLONASE) 50 MCG/ACT nasal spray Place 1 spray into the nose daily. 03/03/20   [provider]  ?guaiFENesin-codeine (ROBITUSSIN AC) 100-10 MG/5ML syrup Take 5 mLs by mouth 3 (three) times daily as needed for cough. 03/06/21   Quintella Reichert, MD  ?hydrALAZINE (APRESOLINE) 25 MG tablet TAKE 1/2 OF A TABLET (12.5 MG TOTAL) BY MOUTH 3 TIMES A DAY 02/26/21   Skeet Latch, MD  ?ipratropium (ATROVENT) 0.06 % nasal spray Place 2 sprays into both nostrils 3 (three) times daily.    [provider]  ?ipratropium-albuterol (DUONEB) 0.5-2.5 (3) MG/3ML SOLN Take 3 mLs by nebulization in the morning, at noon, in the evening, and at bedtime.    [provider]  ?isosorbide dinitrate (ISORDIL) 20 MG tablet Take 1 tablet (20 mg total) by mouth 3 (three) times daily. 04/19/20   Skeet Latch, MD  ?levocetirizine (XYZAL) 5 MG tablet Take 5 mg by  mouth every evening.    [provider]  ?Rolan Lipa 290 MCG CAPS capsule TAKE 1 CAPSULE BY MOUTH EVERY DAY BEFORE BREAKFAST 01/23/21   Zehr, Janett Billow D, PA-C  ?losartan (COZAAR) 50 MG tablet TAKE 1 TABLET BY MOUTH EVERY DAY 01/16/21   Skeet Latch, MD  ?meclizine (ANTIVERT) 25 MG tablet Take 25 mg by mouth 2 (two) times daily as needed for dizziness.    [provider]  ?meloxicam (MOBIC) 15 MG tablet Take 15 mg by mouth daily. 11/20/20   [provider]  ?metolazone (ZAROXOLYN) 2.5 MG tablet  Take 1 tablet (2.5 mg total) by mouth daily as needed (FOR OVERNIGHT WEIGHT GAIN OF 3 lbs. or more). 02/28/19   Skeet Latch, MD  ?montelukast (SINGULAIR) 10 MG tablet TAKE 1 TABLET(10 MG) BY MOUTH AT BEDTIME ?Patient taking differently: Take 10 mg by mouth at bedtime. 07/25/17   Tanda Rockers, MD  ?Potassium Chloride ER 20 MEQ TBCR TAKE 3 TABLETS BY MOUTH 3 TIMES A DAY 01/02/21   Skeet Latch, MD  ?Respiratory Therapy Supplies (FLUTTER) DEVI 1 application by Does not apply route as directed. 12/27/16   Tanda Rockers, MD  ?rosuvastatin (CRESTOR) 20 MG tablet TAKE 1 TABLET BY MOUTH EVERY DAY 01/23/21   Skeet Latch, MD  ?Semaglutide, 1 MG/DOSE, (OZEMPIC, 1 MG/DOSE,) 4 MG/3ML SOPN AFTER YOU FINISH FIRST 8 WEEKS INJECT 1 MG WEEKLY FOR 4 WEEKS THEN INCREASE TO 2 MG WEEKLY 02/21/21   Skeet Latch, MD  ?Semaglutide,0.25 or 0.'5MG'$ /DOS, (OZEMPIC, 0.25 OR 0.5 MG/DOSE,) 2 MG/1.5ML SOPN INJECT 0.25 MG WEEKLY FOR 4 WEEKS THEN INCREASE TO 0.5 MG WEEKLY FOR 4 WEEKS 02/21/21   Skeet Latch, MD  ?spironolactone (ALDACTONE) 25 MG tablet TAKE 1/2 TABLETS BY MOUTH 2 TIMES DAILY. 12/25/20   Skeet Latch, MD  ?topiramate (TOPAMAX) 25 MG tablet Take 25 mg by mouth 2 (two) times daily as needed (for migraines).     [provider]  ?torsemide (DEMADEX) 20 MG tablet TAKE 2 TABLETS BY MOUTH 2 TIMES DAILY. ?Patient taking differently: Take 40 mg by mouth 2 (two) times daily. 07/25/20   Skeet Latch, MD  ?traMADol (ULTRAM) 50 MG tablet Take 1 tablet (50 mg total) by mouth 3 (three) times daily as needed for moderate pain. 01/19/20   Deneise Lever, MD  ?Donnal Debar 200-62.5-25 MCG/INH AEPB Inhale 1 puff into the lungs daily. 05/23/20   [provider]  ?vitamin C (ASCORBIC ACID) 250 MG tablet Take 250 mg by mouth daily.    [provider]  ?   ? ?Allergies    ?Sulfonamide derivatives, Doxycycline, Latex, Ciprofloxacin, Fish allergy, Hydrocortisone, and Neomycin   ? ?Review of Systems    ?Review of Systems  ?Genitourinary:  Positive for flank pain.  ? ?Physical Exam ?Updated Vital Signs ?BP 108/71   Pulse 91   Temp 98.5 ?F (36.9 ?C) (Oral)   Resp 15   Ht '5\' 3"'$  (1.6 m)   Wt 131.1 kg   SpO2 93%   BMI 51.19 kg/m?  ?Today's Vitals  ? 04/10/21 1845 04/10/21 1930 04/10/21 2000 04/10/21 2030  ?BP: 93/74 (!) 83/59 94/71 112/62  ?Pulse: 86 85 79 79  ?Resp: '13 13 12 16  '$ ?Temp:      ?TempSrc:      ?SpO2: 96% 97% 97% 95%  ?Weight:      ?Height:      ?PainSc:      ? ?Body mass index is 51.19 kg/m?. ? ?Physical Exam ?Vitals  and nursing note reviewed.  ?Constitutional:   ?   General: She is not in acute distress. ?   Appearance: She is obese. She is not ill-appearing.  ?HENT:  ?   Head: Atraumatic.  ?Eyes:  ?   Conjunctiva/sclera: Conjunctivae normal.  ?Cardiovascular:  ?   Rate and Rhythm: Normal rate and regular rhythm.  ?   Pulses: Normal pulses.     ?     Radial pulses are 2+ on the right side and 2+ on the left side.  ?     Dorsalis pedis pulses are 2+ on the right side and 2+ on the left side.  ?   Heart sounds: No murmur heard. ?Pulmonary:  ?   Effort: Pulmonary effort is normal. No respiratory distress.  ?   Breath sounds: Normal breath sounds.  ?Abdominal:  ?   General: Abdomen is flat and protuberant. There is no distension.  ?   Palpations: Abdomen is soft.  ?   Tenderness: There is no abdominal tenderness.  ?   Comments: Abdomen is soft, protuberant without tenderness  ?Musculoskeletal:     ?   General: Normal range of motion.  ?   Cervical back: Normal range of motion.  ?Skin: ?   General: Skin is warm and dry.  ?   Capillary Refill: Capillary refill takes less than 2 seconds.  ?Neurological:  ?   General: No focal deficit present.  ?   Mental Status: She is alert.  ?Psychiatric:     ?   Mood and Affect: Mood normal.  ? ? ?ED Results / Procedures / Treatments   ?Labs ?(all labs ordered are listed, but only abnormal results are displayed) ?Labs Reviewed  ?COMPREHENSIVE METABOLIC PANEL -  Abnormal; Notable for the following components:  ?    Result Value  ? Sodium 133 (*)   ? Potassium 3.0 (*)   ? Chloride 91 (*)   ? Glucose, Bld 130 (*)   ? BUN 46 (*)   ? Creatinine, Ser 2.06 (*)   ? GFR, Estimated 27 (*)

## 2021-04-10 NOTE — Telephone Encounter (Signed)
Spoke with pt and she is aware and knows to proceed to the ER. ?

## 2021-04-10 NOTE — Telephone Encounter (Signed)
Sick with abdominal pain is a Triage call  ?

## 2021-04-10 NOTE — Telephone Encounter (Signed)
Line busy

## 2021-04-10 NOTE — Plan of Care (Signed)
[facility] will assume care on arrival to accepting facility. Until arrival, care as per EDP. However, [facility] available 24/7 for questions and assistance.  Nursing staff, please page [facility] Admits and Consults ([phone]) as soon as the patient arrives to the hospital.   

## 2021-04-10 NOTE — ED Notes (Signed)
Floor called a second time and again the nurse was not available and no one else could take report. ?

## 2021-04-10 NOTE — Telephone Encounter (Signed)
See additional phone note. 

## 2021-04-10 NOTE — H&P (Addendum)
?History and Physical  ? ? ?[name] OZH:[id number] DOB: 1960/05/05 DOA: 04/10/2021 ? ?PCP: Bartholome Bill, MD  ?[name] coming from: Home. ? ?Chief Complaint: Abnormal labs. ? ?HPI: [name] is a 61 y.o. female with history of diastolic CHF, hypertension, sleep apnea, morbid obesity, COPD has been having issues with constipation and was given a laxative regimen by [name]'s gastroenterologist.  Following which [name] had multiple episodes of diarrhea and then start developing nausea vomiting.  [name] also was recently placed on Ozempic for weight loss.  Labs done showed acute renal failure with hypokalemia was instructed to come to the ER.  In addition [name] gastroenterologist also feels the [name]'s symptoms may be related to effects from [place] and advised [name] not to take.  Denies any chest pain or shortness of breath. ? ?ED Course: In the ER [name] initially was mildly hypotensive was given fluid bolus.  Labs show worsening creatinine from 1.1 last month it is around 2.06 with hypokalemia of 2.4.  EKG shows sinus tachycardia.  COVID test pending.  [name] was given fluids potassium replaced admitted for further work-up. ? ?Review of Systems: As per HPI, rest all negative. ? ? ?Past Medical History:  ?Diagnosis Date  ? Acute on chronic diastolic CHF (congestive heart failure) ([place]) 04/11/2014  ? Acute suppurative otitis media without spontaneous rupture of eardrum 02/04/2007  ? Centricity Description: OTITIS MEDIA, SUPPURATIVE, ACUTE, BILATERAL Qualifier: Diagnosis of  By: [name] MD, [name]  Centricity Description: OTITIS MEDIA, SUPPURATIVE, ACUTE Qualifier: Diagnosis of  By: [name] MD, Hilliard Clark A   ? Allergic rhinitis   ? Allergy   ? Anemia   ? Anxiety   ? Arthritis   ? Atherosclerosis of aorta ([place]) 02/21/2021  ? CAD (coronary artery disease)   ? LHC (11/29/1999):  EF 60%; no significant CAD.  ? Carpal tunnel syndrome   ? Carpal tunnel syndrome 06/09/2006  ? Qualifier: Diagnosis of  By:  Reatha Armour, Lucy    ? Chronic diastolic CHF (congestive heart failure) ([place])   ? a. Echo (01/21/11): Vigorous LVF, EF 65-70%, no RWMA, Gr 2 DD. b.  Echo (12/15): Moderate LVH, EF 62-95%, grade 2 diastolic dysfunction, mild LAE, normal RV function c. 01/2015: echo showing EF of 60-65% with mild LVH  ? Common migraine   ? COPD (chronic obstructive pulmonary disease) ([place])   ? COVID-19   ? x 2  ? Diastolic heart failure ([place])   ? Diverticulitis   ? Dyspnea   ? GERD (gastroesophageal reflux disease)   ? Hepatic steatosis   ? Hepatomegaly   ? Hiatal hernia   ? History of cardiomegaly   ? Hyperlipidemia   ? Hypertension   ? IBS (irritable bowel syndrome)   ? Internal hemorrhoids   ? Long COVID   ? Metabolic syndrome   ? Morbid obesity ([place])   ? OSA (obstructive sleep apnea)   ? CPAP dependent  ? PNEUMONIA, ORGAN UNSPECIFIED 03/01/2009  ? Qualifier: Diagnosis of  By: [name] CMA, [name]    ? Sinusitis, chronic 03/10/2012  ? CT sinuses 02/2012:  Chronic sinusitis with small A/F levels >> rx by ENT with prolonged augmentin F/u ct sinuses 04/2012:  Persistent sinus thickening >> rx with levaquin and clinda for 30 days.  Told to return for sinus scan in 6 weeks >> no showed for followup visit.  CT sinuses 11/2013:  Acute and chronic sinusitis noted.    ? Sleep apnea   ? bi pap  ? ? ?  Past Surgical History:  ?Procedure Laterality Date  ? ABDOMINAL HYSTERECTOMY    ? BIOPSY  06/22/2018  ? Procedure: BIOPSY;  Surgeon: Mauri Pole, MD;  Location: [facility];  Service: Endoscopy;;  ? BIOPSY  07/28/2018  ? Procedure: BIOPSY;  Surgeon: Mauri Pole, MD;  Location: [facility];  Service: Endoscopy;;  ? CESAREAN SECTION    ? COLONOSCOPY    ? COLONOSCOPY WITH PROPOFOL N/A 05/05/2017  ? Procedure: COLONOSCOPY WITH PROPOFOL;  Surgeon: Mauri Pole, MD;  Location: [facility];  Service: Endoscopy;  Laterality: N/A;  ? ESOPHAGOGASTRODUODENOSCOPY (EGD) WITH PROPOFOL N/A 06/22/2018  ? Procedure: ESOPHAGOGASTRODUODENOSCOPY  (EGD) WITH PROPOFOL;  Surgeon: Mauri Pole, MD;  Location: [facility];  Service: Endoscopy;  Laterality: N/A;  ? ESOPHAGOGASTRODUODENOSCOPY (EGD) WITH PROPOFOL N/A 07/28/2018  ? Procedure: ESOPHAGOGASTRODUODENOSCOPY (EGD) WITH PROPOFOL;  Surgeon: Mauri Pole, MD;  Location: [facility];  Service: Endoscopy;  Laterality: N/A;  ? NASAL SINUS SURGERY    ? PARTIAL HYSTERECTOMY    ? SINUS ENDO WITH FUSION Left 05/28/2019  ? Procedure: SINUS ENDO WITH FUSION;  Surgeon: [name], MD;  Location: [facility];  Service: ENT;  Laterality: Left;  ? TONSILLECTOMY    ? TUBAL LIGATION    ? TURBINATE REDUCTION Bilateral 05/28/2019  ? Procedure: TURBINATE REDUCTION;  Surgeon: [name], MD;  Location: [facility];  Service: ENT;  Laterality: Bilateral;  ? UVULOPALATOPHARYNGOPLASTY    ? ? ? reports that [name] has never smoked. [name] has never used smokeless tobacco. [name] reports that [name] does not drink alcohol and does not use drugs. ? ?Allergies  ?Allergen Reactions  ? Sulfonamide Derivatives Anaphylaxis, Swelling, Rash and Other (See Comments)  ?  Swelling tongue and ear   ? Doxycycline Nausea And Vomiting  ? Latex Hives  ? Ciprofloxacin Rash  ? Fish Allergy Rash and Other (See Comments)  ?  Only reaction to [place].  No other issues with any type of fish or shellfish currently   ? Hydrocortisone Rash  ? Neomycin Rash  ? ? ?Family History  ?Problem Relation Age of Onset  ? Heart disease Mother   ? Heart attack Mother   ? Hypertension Mother   ? Coronary artery disease Other   ?     1st degree relatvie <50  ? Breast cancer Other   ?     aunt- ? paternal or maternal  ? Asthma Sister   ? Colon polyps Sister   ? Stroke Neg Hx   ? Colon cancer Neg Hx   ? Esophageal cancer Neg Hx   ? Rectal cancer Neg Hx   ? Stomach cancer Neg Hx   ? ? ?Prior to Admission medications   ?Medication Sig Start Date End Date Taking? Authorizing Provider  ?acetaminophen (TYLENOL 8 HOUR) 650 MG CR tablet Take 1 tablet (650 mg total) by mouth every  8 (eight) hours as needed for pain. 12/18/19   Suzy Bouchard, PA-C  ?Azelastine-Fluticasone 137-50 MCG/ACT SUSP Place 137 mcg into the nose in the morning, at noon, and at bedtime.    [provider]  ?benzonatate (TESSALON) 100 MG capsule Take 100 mg by mouth 3 (three) times daily as needed for cough. 10/09/20   [provider]  ?carvedilol (COREG) 12.5 MG tablet TAKE 1 TABLET (12.5 MG TOTAL) BY MOUTH 2 (TWO) TIMES DAILY WITH A MEAL. 04/05/21   Skeet Latch, MD  ?dexlansoprazole (DEXILANT) 60 MG capsule Take 1 capsule (60 mg total) by mouth daily. Daily  in the morning 02/13/21   Mauri Pole, MD  ?dicyclomine (BENTYL) 10 MG capsule Take 1 capsule (10 mg total) by mouth every 8 (eight) hours as needed for spasms. 04/24/20   Mauri Pole, MD  ?EPINEPHrine 0.3 mg/0.3 mL IJ SOAJ injection Inject 0.3 mg into the muscle as needed for anaphylaxis. 12/19/19   [provider]  ?ergocalciferol (VITAMIN D2) 1.25 MG (50000 UT) capsule Take 50,000 Units by mouth once a week. Monday's 05/18/20   [provider]  ?FARXIGA 10 MG TABS tablet Take 10 mg by mouth daily. 10/18/20   [provider]  ?fluticasone (FLONASE) 50 MCG/ACT nasal spray Place 1 spray into the nose daily. 03/03/20   [provider]  ?guaiFENesin-codeine (ROBITUSSIN AC) 100-10 MG/5ML syrup Take 5 mLs by mouth 3 (three) times daily as needed for cough. 03/06/21   Quintella Reichert, MD  ?hydrALAZINE (APRESOLINE) 25 MG tablet TAKE 1/2 OF A TABLET (12.5 MG TOTAL) BY MOUTH 3 TIMES A DAY 02/26/21   Skeet Latch, MD  ?ipratropium (ATROVENT) 0.06 % nasal spray Place 2 sprays into both nostrils 3 (three) times daily.    [provider]  ?ipratropium-albuterol (DUONEB) 0.5-2.5 (3) MG/3ML SOLN Take 3 mLs by nebulization in the morning, at noon, in the evening, and at bedtime.    [provider]  ?isosorbide dinitrate (ISORDIL) 20 MG tablet Take 1 tablet (20 mg total) by mouth 3 (three)  times daily. 04/19/20   Skeet Latch, MD  ?levocetirizine (XYZAL) 5 MG tablet Take 5 mg by mouth every evening.    [provider]  ?LINZESS 290 MCG CAPS capsule TAKE 1 CAPSULE BY MOUTH EVERY

## 2021-04-10 NOTE — Telephone Encounter (Signed)
She recently got restarted on Ozempic last week, given recurrent episodes of nausea and vomiting resulting in severe hypokalemia. She needs to be off Ozempic as she is having potential side effect associated with it.  ?

## 2021-04-10 NOTE — Telephone Encounter (Signed)
Inbound call from patient stating that she did the bowel purge and she now having abd pain and she also got sick last night. Seeking advice, please advise.  ?

## 2021-04-10 NOTE — Telephone Encounter (Signed)
Potassium is critically low, please advise patient to come to ER for repletion and management ?

## 2021-04-10 NOTE — Telephone Encounter (Signed)
Pt reports she did the purge as she was instructed. She vomited afterwards and she is still having pain on her left side where Dr. Silverio Decamp told her the blockage is located. Reports after the purge she got lightheaded and had to lay down. Pt came in today for her lab work and wanted to let Dr. Silverio Decamp know. ?

## 2021-04-11 DIAGNOSIS — E876 Hypokalemia: Secondary | ICD-10-CM | POA: Diagnosis not present

## 2021-04-11 LAB — BASIC METABOLIC PANEL
Anion gap: 9 (ref 5–15)
BUN: 39 mg/dL — ABNORMAL HIGH (ref 8–23)
CO2: 26 mmol/L (ref 22–32)
Calcium: 8.7 mg/dL — ABNORMAL LOW (ref 8.9–10.3)
Chloride: 100 mmol/L (ref 98–111)
Creatinine, Ser: 1.26 mg/dL — ABNORMAL HIGH (ref 0.44–1.00)
GFR, Estimated: 48 mL/min — ABNORMAL LOW (ref >60)
Glucose, Bld: 150 mg/dL — ABNORMAL HIGH (ref 70–99)
Potassium: 3.2 mmol/L — ABNORMAL LOW (ref 3.5–5.1)
Sodium: 135 mmol/L (ref 135–145)

## 2021-04-11 LAB — COMPREHENSIVE METABOLIC PANEL
ALT: 18 U/L (ref 0–44)
AST: 20 U/L (ref 15–41)
Albumin: 3.2 g/dL — ABNORMAL LOW (ref 3.5–5.0)
Alkaline Phosphatase: 67 U/L (ref 38–126)
Anion gap: 12 (ref 5–15)
BUN: 44 mg/dL — ABNORMAL HIGH (ref 8–23)
CO2: 26 mmol/L (ref 22–32)
Calcium: 8.9 mg/dL (ref 8.9–10.3)
Chloride: 95 mmol/L — ABNORMAL LOW (ref 98–111)
Creatinine, Ser: 1.74 mg/dL — ABNORMAL HIGH (ref 0.44–1.00)
GFR, Estimated: 33 mL/min — ABNORMAL LOW (ref >60)
Glucose, Bld: 143 mg/dL — ABNORMAL HIGH (ref 70–99)
Potassium: 2.7 mmol/L — CL (ref 3.5–5.1)
Sodium: 133 mmol/L — ABNORMAL LOW (ref 135–145)
Total Bilirubin: 0.8 mg/dL (ref 0.3–1.2)
Total Protein: 7.4 g/dL (ref 6.5–8.1)

## 2021-04-11 LAB — URINALYSIS, ROUTINE W REFLEX MICROSCOPIC
Bilirubin Urine: NEGATIVE
Glucose, UA: >=500 mg/dL — AB
Hgb urine dipstick: NEGATIVE
Ketones, ur: NEGATIVE mg/dL
Leukocytes,Ua: NEGATIVE
Nitrite: NEGATIVE
Protein, ur: NEGATIVE mg/dL
Specific Gravity, Urine: 1.011 (ref 1.005–1.030)
pH: 5 (ref 5.0–8.0)

## 2021-04-11 LAB — RESP PANEL BY RT-PCR (FLU A&B, COVID) ARPGX2
Influenza A by PCR: NEGATIVE
Influenza B by PCR: NEGATIVE
SARS Coronavirus 2 by RT PCR: NEGATIVE

## 2021-04-11 LAB — HIV ANTIBODY (ROUTINE TESTING W REFLEX): HIV Screen 4th Generation wRfx: NONREACTIVE

## 2021-04-11 LAB — CBC
HCT: 36.1 % (ref 36.0–46.0)
Hemoglobin: 11.7 g/dL — ABNORMAL LOW (ref 12.0–15.0)
MCH: 30.4 pg (ref 26.0–34.0)
MCHC: 32.4 g/dL (ref 30.0–36.0)
MCV: 93.8 fL (ref 80.0–100.0)
Platelets: 249 10*3/uL (ref 150–400)
RBC: 3.85 MIL/uL — ABNORMAL LOW (ref 3.87–5.11)
RDW: 14.3 % (ref 11.5–15.5)
WBC: 12.1 10*3/uL — ABNORMAL HIGH (ref 4.0–10.5)
nRBC: 0 % (ref 0.0–0.2)

## 2021-04-11 LAB — MAGNESIUM: Magnesium: 2.4 mg/dL (ref 1.7–2.4)

## 2021-04-11 LAB — SODIUM, URINE, RANDOM: Sodium, Ur: <10 mmol/L

## 2021-04-11 LAB — CREATININE, URINE, RANDOM: Creatinine, Urine: 120.22 mg/dL

## 2021-04-11 MED ORDER — POTASSIUM CHLORIDE CRYS ER 20 MEQ PO TBCR
40.0000 meq | EXTENDED_RELEASE_TABLET | Freq: Once | ORAL | Status: AC
  Administered 2021-04-11: 40 meq via ORAL
  Filled 2021-04-11: qty 2

## 2021-04-11 MED ORDER — HYDRALAZINE HCL 20 MG/ML IJ SOLN: 10.0000 mg | INTRAMUSCULAR | Status: DC | PRN

## 2021-04-11 MED ORDER — BENZONATATE 100 MG PO CAPS
100.0000 mg | ORAL_CAPSULE | Freq: Three times a day (TID) | ORAL | Status: DC
  Administered 2021-04-11 – 2021-04-12 (×4): 100 mg via ORAL
  Filled 2021-04-11 (×4): qty 1

## 2021-04-11 MED ORDER — POTASSIUM CHLORIDE 10 MEQ/100ML IV SOLN
INTRAVENOUS | Status: AC
Start: 2021-04-11 — End: 2021-04-11
  Administered 2021-04-11: 10 meq via INTRAVENOUS
  Filled 2021-04-11: qty 100

## 2021-04-11 MED ORDER — POTASSIUM CHLORIDE 10 MEQ/100ML IV SOLN
10.0000 meq | INTRAVENOUS | Status: AC
  Administered 2021-04-11 (×2): 10 meq via INTRAVENOUS
  Filled 2021-04-11 (×3): qty 100

## 2021-04-11 MED ORDER — ENOXAPARIN SODIUM 60 MG/0.6ML IJ SOSY
60.0000 mg | PREFILLED_SYRINGE | INTRAMUSCULAR | Status: DC
  Administered 2021-04-11 – 2021-04-13 (×3): 60 mg via SUBCUTANEOUS
  Filled 2021-04-11 (×3): qty 0.6

## 2021-04-11 MED ORDER — POTASSIUM CHLORIDE CRYS ER 20 MEQ PO TBCR
40.0000 meq | EXTENDED_RELEASE_TABLET | Freq: Once | ORAL | Status: AC
  Administered 2021-04-11: 40 meq via ORAL
  Filled 2021-04-11 (×2): qty 2

## 2021-04-11 NOTE — TOC Initial Note (Signed)
Transition of Care (TOC) - Initial/Assessment Note  ? ? ?Patient Details  ?Name: [name] ?MRN: [id number] ?Date of Birth: 1960/07/20 ? ?Transition of Care (TOC) CM/SW Contact:    ?Tawanna Cooler, RN ?Phone Number: ?04/11/2021, 4:24 PM ? ?Clinical Narrative:                 ? ? ?Transition of Care Department (TOC) has reviewed patient and no TOC needs have been identified at this time. We will continue to monitor patient advancement through interdisciplinary progression rounds. If new patient transition needs arise, please place a TOC consult. ?  ? ? ?Expected Discharge Plan: Home/Self Care ?Barriers to Discharge: Continued Medical Work up ? ? ?Patient Goals and CMS Choice ?Patient states their goals for this hospitalization and ongoing recovery are:: return home ?  ?  ?Expected Discharge Plan and Services ?Expected Discharge Plan: Home/Self Care ?  ?   ?Living arrangements for the past 2 months: [place] ?                ?  ?Prior Living Arrangements/Services ?Living arrangements for the past 2 months: [place] ?Lives with:: Adult Children ?Patient language and need for interpreter reviewed:: Yes ?Do you feel safe going back to the place where you live?: Yes      ?Need for Family Participation in Patient Care: Yes (Comment) ?Care giver support system in place?: Yes (comment) ?  ?Criminal Activity/Legal Involvement Pertinent to Current Situation/Hospitalization: No - Comment as needed ? ?Activities of Daily Living ?Home Assistive Devices/Equipment: None ?ADL Screening (condition at time of admission) ?Patient's cognitive ability adequate to safely complete daily activities?: Yes ?Is the patient deaf or have difficulty hearing?: No ?Does the patient have difficulty seeing, even when wearing glasses/contacts?: No ?Does the patient have difficulty concentrating, remembering, or making decisions?: No ?Patient able to express need for assistance with ADLs?: Yes ?Does the patient have difficulty  dressing or bathing?: Yes ?Independently performs ADLs?: Yes (appropriate for developmental age) ?Does the patient have difficulty walking or climbing stairs?: No ?Weakness of Legs: Both ?Weakness of Arms/Hands: None ? ?  ?Orientation: : Oriented to Self, Oriented to Place, Oriented to  Time, Oriented to Situation ?Alcohol / Substance Use: Not Applicable ?Psych Involvement: No (comment) ? ?Admission diagnosis:  Dehydration [E86.0] ?Hypokalemia [E87.6] ?Hypokalemia due to excessive gastrointestinal loss of potassium [E87.6] ?ARF (acute renal failure) ([place]) [N17.9] ?Patient Active Problem List  ? Diagnosis Date Noted  ? ARF (acute renal failure) ([place]) 04/10/2021  ? Atherosclerosis of aorta ([place]) 02/21/2021  ? Constipation 05/10/2020  ? Gastroesophageal reflux disease 05/10/2020  ? CAP (community acquired pneumonia) 11/11/2018  ? Abdominal pain, chronic, epigastric   ? Abdominal pain   ? Gastritis and gastroduodenitis   ? Bronchitis 02/12/2018  ? Polyp of sigmoid colon   ? CKD (chronic kidney disease) stage 3, GFR 30-59 ml/min ([facility]) 07/08/2016  ? Cough variant asthma 07/31/2015  ? Upper airway cough syndrome 04/11/2015  ? Bronchitis, chronic obstructive w acute bronchitis ([place]) 04/11/2015  ? Dyspnea   ? Chronic diastolic heart failure ([place]) 03/27/2015  ? Morbid obesity due to excess calories ([place]) 03/27/2015  ? Dysphagia 03/27/2015  ? Hyponatremia 03/27/2015  ? Acute kidney injury ([place]) 03/27/2015  ? Hypokalemia 03/27/2015  ? Leukocytosis 03/27/2015  ? COPD exacerbation ([place]) 03/27/2015  ? Chest tightness 02/19/2015  ? Other irritable bowel syndrome 04/13/2014  ? Sinusitis, chronic 03/10/2012  ? Hyperlipidemia 10/02/2006  ? Obstructive sleep apnea 06/09/2006  ?  Essential hypertension 06/09/2006  ? ?PCP:  Bartholome Bill, MD ?Pharmacy:   ?[id number] [address] [address]?Phone: [phone]:  [phone]? ?[id number] [id number]: [phone]: [phone] ?

## 2021-04-11 NOTE — Progress Notes (Signed)
Critical lab value of 2.7 Potassium reported to floor coverage. ?

## 2021-04-11 NOTE — Progress Notes (Signed)
?PROGRESS NOTE ? ? ? ?[name]  ZOX:[id number] DOB: 03-Nov-1960 DOA: 04/10/2021 ?PCP: Bartholome Bill, MD  ?Brief Narrative: 61 year old obese female with history of sleep apnea, diastolic heart failure, hypertension was having constipation for which she was prescribed laxatives MiraLAX and Linzess by her gastroenterologist.  ?She has also been on Ozempic ? patient began having multiple episodes of diarrhea nausea and vomiting. ?Patient was noted to have AKI and hypokalemia and was asked to come to the ER by gi dr nandigam ? ?Assessment & Plan: ?  ?Principal Problem: ?  Hypokalemia ?Active Problems: ?  Acute kidney injury ([place]) ?  Essential hypertension ?  ARF (acute renal failure) ([place]) ? ? ?#1 AKI with hypokalemia due to dehydration from nausea and vomiting and diarrhea in the setting of diuretics ARB spironolactone and meloxicam.  Hold all the above and to continue IV hydration carefully in the setting of history of heart failure. ??  Possible Ozempic reaction hold Ozempic will not restart on discharge ?CT abdomen no acute findings ? ?#2 severe hypokalemia replete aggressively and recheck BMP this evening. ? ?#3 history of essential hypertension patient was mildly hypotensive on admission.  Continue Coreg hydralazine and Isordil. ? ?#4 history of diastolic heart failure currently stable holding all cardiac meds including ARB Aldactone and torsemide. ? ?#5 history of sleep apnea on BiPAP ? ?#6 hyperlipidemia on statins ? ?#7 leukocytosis mild likely referred  with no evidence of infection. ? ? ?Estimated body mass index is 51.19 kg/m? as calculated from the following: ?  Height as of this encounter: '5\' 3"'$  (1.6 m). ?  Weight as of this encounter: 131.1 kg. ? ?DVT prophylaxis: Lovenox  ?code Status: Full code ?Family Communication: None at bedside  ?disposition Plan:  Status is: Observation ? ?  ?Consultants:  ?None ? ?Procedures: None ?Antimicrobials none ? ?Subjective: ?Patient is resting in bed she is  requesting to have Protonix restarted she denies any chest pain or shortness of breath she continues to have diarrhea and nausea and unable to keep anything p.o. ? ?Objective: ?Vitals:  ? 04/11/21 0452 04/11/21 0500 04/11/21 0824 04/11/21 1030  ?BP: (!) 130/57   (!) 157/101  ?Pulse: 81   76  ?Resp: '16  18 18  '$ ?Temp: 99.1 ?F (37.3 ?C)   97.7 ?F (36.5 ?C)  ?TempSrc: Oral   Oral  ?SpO2: 95%  95% 97%  ?Weight:  131.1 kg    ?Height:      ? ? ?Intake/Output Summary (Last 24 hours) at 04/11/2021 1408 ?Last data filed at 04/11/2021 0600 ?Gross per 24 hour  ?Intake 1804.9 ml  ?Output 0 ml  ?Net 1804.9 ml  ? ?Filed Weights  ? 04/10/21 1706 04/11/21 0500  ?Weight: 131.1 kg 131.1 kg  ? ? ?Examination: ? ?General exam: Appears in mild distress due to nausea and diarrhea ?Respiratory system: Clear to auscultation. Respiratory effort normal. ?Cardiovascular system: S1 & S2 heard, RRR. No JVD, murmurs, rubs, gallops or clicks. No pedal edema. ?Gastrointestinal system: Abdomen is nondistended, soft and mild tender diffuse. No organomegaly or masses felt. Normal bowel sounds heard. ?Central nervous system: Alert and oriented. No focal neurological deficits. ?Extremities: Symmetric 5 x 5 power. ?Skin: No rashes, lesions or ulcers ?Psychiatry: Judgement and insight appear normal. Mood & affect appropriate.  ? ? ? ?Data Reviewed: I have personally reviewed following labs and imaging studies ? ?CBC: ?Recent Labs  ?Lab 04/10/21 ?1330 04/11/21 ?0442  ?WBC 13.7* 12.1*  ?NEUTROABS 9.6*  --   ?HGB  12.9 11.7*  ?HCT 38.5 36.1  ?MCV 91.8 93.8  ?PLT 273.0 249  ? ?Basic Metabolic Panel: ?Recent Labs  ?Lab 04/10/21 ?1330 04/10/21 ?1811 04/11/21 ?0442  ?NA 132* 133* 133*  ?K 2.4* 3.0* 2.7*  ?CL 88* 91* 95*  ?CO2 '29 28 26  '$ ?GLUCOSE 116* 130* 143*  ?BUN 38* 46* 44*  ?CREATININE 1.89* 2.06* 1.74*  ?CALCIUM 10.0 9.5 8.9  ?MG  --  2.2 2.4  ? ?GFR: ?Estimated Creatinine Clearance: 44.4 mL/min (A) (by C-G formula based on SCr of 1.74 mg/dL (H)). ?Liver  Function Tests: ?Recent Labs  ?Lab 04/10/21 ?1811 04/11/21 ?0442  ?AST 18 20  ?ALT 15 18  ?ALKPHOS 69 67  ?BILITOT 1.0 0.8  ?PROT 8.1 7.4  ?ALBUMIN 4.1 3.2*  ? ?Recent Labs  ?Lab 04/10/21 ?1811  ?LIPASE 25  ? ?No results for input(s): AMMONIA in the last 168 hours. ?Coagulation Profile: ?No results for input(s): INR, PROTIME in the last 168 hours. ?Cardiac Enzymes: ?No results for input(s): CKTOTAL, CKMB, CKMBINDEX, TROPONINI in the last 168 hours. ?BNP (last 3 results) ?No results for input(s): PROBNP in the last 8760 hours. ?HbA1C: ?No results for input(s): HGBA1C in the last 72 hours. ?CBG: ?No results for input(s): GLUCAP in the last 168 hours. ?Lipid Profile: ?No results for input(s): CHOL, HDL, LDLCALC, TRIG, CHOLHDL, LDLDIRECT in the last 72 hours. ?Thyroid Function Tests: ?No results for input(s): TSH, T4TOTAL, FREET4, T3FREE, THYROIDAB in the last 72 hours. ?Anemia Panel: ?No results for input(s): VITAMINB12, FOLATE, FERRITIN, TIBC, IRON, RETICCTPCT in the last 72 hours. ?Sepsis Labs: ?No results for input(s): PROCALCITON, LATICACIDVEN in the last 168 hours. ? ?No results found for this or any previous visit (from the past 240 hour(s)).  ? ? ? ? ? ?Radiology Studies: ?CT ABDOMEN PELVIS WO CONTRAST ? ?Result Date: 04/10/2021 ?CLINICAL DATA:  Abdominal pain, evaluate for bowel obstruction. EXAM: CT ABDOMEN AND PELVIS WITHOUT CONTRAST TECHNIQUE: Multidetector CT imaging of the abdomen and pelvis was performed following the standard protocol without IV contrast. RADIATION DOSE REDUCTION: This exam was performed according to the departmental dose-optimization program which includes automated exposure control, adjustment of the mA and/or kV according to patient size and/or use of iterative reconstruction technique. COMPARISON:  CT abdomen and pelvis 03/17/2021, CT abdomen and pelvis 06/26/2016, MR abdomen 07/23/2016. FINDINGS: Lower chest: No acute abnormality. Hepatobiliary: No focal liver abnormality is seen. No  gallstones, gallbladder wall thickening, or biliary dilatation. Pancreas: Unremarkable. No pancreatic ductal dilatation or surrounding inflammatory changes. Spleen: Normal in size without focal abnormality. Adrenals/Urinary Tract: Left adrenal nodularity is unchanged from prior examinations dating back to 2018. right adrenal gland unremarkable. Kidneys and bladder are within normal limits. Stomach/Bowel: Stable small hiatal hernia. Stomach is otherwise within normal limits. Appendix appears normal. No evidence of bowel wall thickening, distention, or inflammatory changes. Vascular/Lymphatic: No significant vascular findings are present. No enlarged abdominal or pelvic lymph nodes. Reproductive: Status post hysterectomy. No adnexal masses. Other: No ascites. Moderate-sized fat containing umbilical hernia appears unchanged from prior. Musculoskeletal: No acute or significant osseous findings. IMPRESSION: 1. No acute localizing process in the abdomen or pelvis. 2. Stable moderate-sized fat containing umbilical hernia. Electronically Signed   By: Ronney Asters M.D.   On: 04/10/2021 18:41   ? ? ? ? ? ?Scheduled Meds: ? benzonatate  100 mg Oral TID  ? carvedilol  12.5 mg Oral BID WC  ? enoxaparin (LOVENOX) injection  60 mg Subcutaneous Q24H  ? fluticasone furoate-vilanterol  1 puff Inhalation  Daily  ? And  ? umeclidinium bromide  1 puff Inhalation Daily  ? hydrALAZINE  12.5 mg Oral Q8H  ? isosorbide dinitrate  20 mg Oral TID  ? linaclotide  290 mcg Oral QAC breakfast  ? montelukast  10 mg Oral QHS  ? rosuvastatin  20 mg Oral Daily  ? ?Continuous Infusions: ? lactated ringers 100 mL/hr at 04/11/21 0003  ? potassium chloride 10 mEq (04/11/21 1310)  ? ? ? LOS: 0 days  ? ? ?Time spent: 39 min ? ?Georgette Shell, MD ?04/11/2021, 2:08 PM  ? ?

## 2021-04-11 NOTE — Telephone Encounter (Signed)
Pt aware.

## 2021-04-12 DIAGNOSIS — Z20822 Contact with and (suspected) exposure to covid-19: Secondary | ICD-10-CM | POA: Diagnosis present

## 2021-04-12 DIAGNOSIS — Z8249 Family history of ischemic heart disease and other diseases of the circulatory system: Secondary | ICD-10-CM | POA: Diagnosis not present

## 2021-04-12 DIAGNOSIS — I959 Hypotension, unspecified: Secondary | ICD-10-CM | POA: Diagnosis present

## 2021-04-12 DIAGNOSIS — Z881 Allergy status to other antibiotic agents status: Secondary | ICD-10-CM | POA: Diagnosis not present

## 2021-04-12 DIAGNOSIS — Z882 Allergy status to sulfonamides status: Secondary | ICD-10-CM | POA: Diagnosis not present

## 2021-04-12 DIAGNOSIS — D72829 Elevated white blood cell count, unspecified: Secondary | ICD-10-CM | POA: Diagnosis present

## 2021-04-12 DIAGNOSIS — Z8371 Family history of colonic polyps: Secondary | ICD-10-CM | POA: Diagnosis not present

## 2021-04-12 DIAGNOSIS — Z803 Family history of malignant neoplasm of breast: Secondary | ICD-10-CM | POA: Diagnosis not present

## 2021-04-12 DIAGNOSIS — Z8616 Personal history of COVID-19: Secondary | ICD-10-CM | POA: Diagnosis not present

## 2021-04-12 DIAGNOSIS — J449 Chronic obstructive pulmonary disease, unspecified: Secondary | ICD-10-CM | POA: Diagnosis present

## 2021-04-12 DIAGNOSIS — K5900 Constipation, unspecified: Secondary | ICD-10-CM | POA: Diagnosis present

## 2021-04-12 DIAGNOSIS — G4733 Obstructive sleep apnea (adult) (pediatric): Secondary | ICD-10-CM | POA: Diagnosis present

## 2021-04-12 DIAGNOSIS — E876 Hypokalemia: Secondary | ICD-10-CM | POA: Diagnosis present

## 2021-04-12 DIAGNOSIS — I5032 Chronic diastolic (congestive) heart failure: Secondary | ICD-10-CM | POA: Diagnosis present

## 2021-04-12 DIAGNOSIS — E785 Hyperlipidemia, unspecified: Secondary | ICD-10-CM | POA: Diagnosis present

## 2021-04-12 DIAGNOSIS — R112 Nausea with vomiting, unspecified: Secondary | ICD-10-CM | POA: Diagnosis present

## 2021-04-12 DIAGNOSIS — R197 Diarrhea, unspecified: Secondary | ICD-10-CM | POA: Diagnosis present

## 2021-04-12 DIAGNOSIS — Z6841 Body Mass Index (BMI) 40.0 and over, adult: Secondary | ICD-10-CM | POA: Diagnosis not present

## 2021-04-12 DIAGNOSIS — N179 Acute kidney failure, unspecified: Secondary | ICD-10-CM | POA: Diagnosis present

## 2021-04-12 DIAGNOSIS — Z825 Family history of asthma and other chronic lower respiratory diseases: Secondary | ICD-10-CM | POA: Diagnosis not present

## 2021-04-12 DIAGNOSIS — I110 Hypertensive heart disease with heart failure: Secondary | ICD-10-CM | POA: Diagnosis present

## 2021-04-12 DIAGNOSIS — Z91040 Latex allergy status: Secondary | ICD-10-CM | POA: Diagnosis not present

## 2021-04-12 DIAGNOSIS — E860 Dehydration: Secondary | ICD-10-CM | POA: Diagnosis present

## 2021-04-12 LAB — BASIC METABOLIC PANEL
Anion gap: 7 (ref 5–15)
BUN: 29 mg/dL — ABNORMAL HIGH (ref 8–23)
CO2: 25 mmol/L (ref 22–32)
Calcium: 8.9 mg/dL (ref 8.9–10.3)
Chloride: 104 mmol/L (ref 98–111)
Creatinine, Ser: 1 mg/dL (ref 0.44–1.00)
GFR, Estimated: >60 mL/min (ref >60)
Glucose, Bld: 113 mg/dL — ABNORMAL HIGH (ref 70–99)
Potassium: 2.9 mmol/L — ABNORMAL LOW (ref 3.5–5.1)
Sodium: 136 mmol/L (ref 135–145)

## 2021-04-12 LAB — CBC
HCT: 32.3 % — ABNORMAL LOW (ref 36.0–46.0)
Hemoglobin: 10.6 g/dL — ABNORMAL LOW (ref 12.0–15.0)
MCH: 31.4 pg (ref 26.0–34.0)
MCHC: 32.8 g/dL (ref 30.0–36.0)
MCV: 95.6 fL (ref 80.0–100.0)
Platelets: 211 10*3/uL (ref 150–400)
RBC: 3.38 MIL/uL — ABNORMAL LOW (ref 3.87–5.11)
RDW: 14.2 % (ref 11.5–15.5)
WBC: 11 10*3/uL — ABNORMAL HIGH (ref 4.0–10.5)
nRBC: 0 % (ref 0.0–0.2)

## 2021-04-12 LAB — MAGNESIUM: Magnesium: 2.4 mg/dL (ref 1.7–2.4)

## 2021-04-12 MED ORDER — POTASSIUM CHLORIDE 10 MEQ/100ML IV SOLN
10.0000 meq | INTRAVENOUS | Status: AC
  Administered 2021-04-12 (×4): 10 meq via INTRAVENOUS
  Filled 2021-04-12 (×4): qty 100

## 2021-04-12 MED ORDER — IPRATROPIUM BROMIDE 0.06 % NA SOLN
2.0000 | Freq: Three times a day (TID) | NASAL | Status: DC
  Administered 2021-04-12 – 2021-04-13 (×4): 2 via NASAL
  Filled 2021-04-12: qty 15

## 2021-04-12 MED ORDER — BENZONATATE 100 MG PO CAPS
200.0000 mg | ORAL_CAPSULE | Freq: Three times a day (TID) | ORAL | Status: DC
  Administered 2021-04-12 – 2021-04-13 (×4): 200 mg via ORAL
  Filled 2021-04-12 (×4): qty 2

## 2021-04-12 MED ORDER — DICYCLOMINE HCL 10 MG PO CAPS: 10.0000 mg | ORAL_CAPSULE | Freq: Three times a day (TID) | ORAL | Status: DC | PRN

## 2021-04-12 MED ORDER — DAPAGLIFLOZIN PROPANEDIOL 10 MG PO TABS
10.0000 mg | ORAL_TABLET | Freq: Every day | ORAL | Status: DC
  Administered 2021-04-12 – 2021-04-13 (×2): 10 mg via ORAL
  Filled 2021-04-12 (×2): qty 1

## 2021-04-12 MED ORDER — LEVOCETIRIZINE DIHYDROCHLORIDE 5 MG PO TABS: 5.0000 mg | ORAL_TABLET | Freq: Every evening | ORAL | Status: DC

## 2021-04-12 MED ORDER — POTASSIUM CHLORIDE CRYS ER 20 MEQ PO TBCR
40.0000 meq | EXTENDED_RELEASE_TABLET | Freq: Three times a day (TID) | ORAL | Status: AC
  Administered 2021-04-12 – 2021-04-13 (×2): 40 meq via ORAL
  Filled 2021-04-12 (×3): qty 2

## 2021-04-12 MED ORDER — LORATADINE 10 MG PO TABS
10.0000 mg | ORAL_TABLET | Freq: Every day | ORAL | Status: DC
  Administered 2021-04-12 – 2021-04-13 (×2): 10 mg via ORAL
  Filled 2021-04-12 (×2): qty 1

## 2021-04-12 MED ORDER — FLUTICASONE PROPIONATE 50 MCG/ACT NA SUSP
1.0000 | Freq: Every day | NASAL | Status: DC
  Administered 2021-04-12 – 2021-04-13 (×2): 1 via NASAL
  Filled 2021-04-12: qty 16

## 2021-04-12 MED ORDER — ONDANSETRON HCL 4 MG/2ML IJ SOLN: 4.0000 mg | Freq: Three times a day (TID) | INTRAMUSCULAR | Status: DC | PRN

## 2021-04-12 MED ORDER — TOPIRAMATE 25 MG PO TABS: 25.0000 mg | ORAL_TABLET | Freq: Two times a day (BID) | ORAL | Status: DC | PRN

## 2021-04-12 MED ORDER — POTASSIUM CHLORIDE CRYS ER 20 MEQ PO TBCR
40.0000 meq | EXTENDED_RELEASE_TABLET | Freq: Once | ORAL | Status: AC
  Administered 2021-04-12: 40 meq via ORAL
  Filled 2021-04-12: qty 2

## 2021-04-12 MED ORDER — PANTOPRAZOLE SODIUM 40 MG PO TBEC
40.0000 mg | DELAYED_RELEASE_TABLET | Freq: Every day | ORAL | Status: DC
  Administered 2021-04-12 – 2021-04-13 (×2): 40 mg via ORAL
  Filled 2021-04-12 (×2): qty 1

## 2021-04-12 MED ORDER — AZELASTINE-FLUTICASONE 137-50 MCG/ACT NA SUSP: 137.0000 ug | Freq: Three times a day (TID) | NASAL | Status: DC

## 2021-04-12 MED ORDER — IPRATROPIUM-ALBUTEROL 0.5-2.5 (3) MG/3ML IN SOLN
3.0000 mL | Freq: Four times a day (QID) | RESPIRATORY_TRACT | Status: DC | PRN
  Administered 2021-04-12 (×2): 3 mL via RESPIRATORY_TRACT
  Filled 2021-04-12 (×2): qty 3

## 2021-04-12 MED ORDER — IPRATROPIUM-ALBUTEROL 0.5-2.5 (3) MG/3ML IN SOLN
3.0000 mL | Freq: Four times a day (QID) | RESPIRATORY_TRACT | Status: DC
  Administered 2021-04-13 (×3): 3 mL via RESPIRATORY_TRACT
  Filled 2021-04-12 (×3): qty 3

## 2021-04-12 NOTE — Progress Notes (Signed)
?PROGRESS NOTE ? ? ? ?[name]  VZD:[id number] DOB: 1960/06/15 DOA: 04/10/2021 ?PCP: Bartholome Bill, MD  ?Brief Narrative: 61 year old obese female with history of sleep apnea, diastolic heart failure, hypertension was having constipation for which she was prescribed laxatives MiraLAX and Linzess by her gastroenterologist.  ?She has also been on Ozempic ? patient began having multiple episodes of diarrhea nausea and vomiting. ?Patient was noted to have AKI and hypokalemia and was asked to come to the ER by gi dr nandigam ? ?Assessment & Plan: ?  ?Principal Problem: ?  Hypokalemia ?Active Problems: ?  Acute kidney injury ([place]) ?  Essential hypertension ?  ARF (acute renal failure) ([place]) ? ? ?#1 AKI with hypokalemia due to dehydration from nausea and vomiting and diarrhea in the setting of diuretics ARB spironolactone and meloxicam.  Hold all the above and to continue IV hydration carefully in the setting of history of heart failure. ??  Possible Ozempic reaction hold Ozempic will not restart on discharge ?CT abdomen no acute findings ?Out of bed ambulate PT OT consult ? ?#2 severe hypokalemia replete aggressively and recheck BMP this evening.  She continues to be severely hypokalemic with a K of 2.9. ? ? ?#3 history of essential hypertension patient was mildly hypotensive on admission.  Continue Coreg hydralazine and Isordil. ? ?#4 history of diastolic heart failure currently stable holding all cardiac meds including ARB Aldactone and torsemide. ? ?#5 history of sleep apnea on BiPAP ? ?#6 hyperlipidemia on statins ? ?#7 leukocytosis mild likely referred  with no evidence of infection. ? ? ?Estimated body mass index is 52.92 kg/m? as calculated from the following: ?  Height as of this encounter: '5\' 3"'$  (1.6 m). ?  Weight as of this encounter: 135.5 kg. ? ?DVT prophylaxis: Lovenox  ?code Status: Full code ?Family Communication: None at bedside  ?disposition Plan:  Status is: Observation if able to keep  potassium stable will plan discharge 04/13/2021. ? ?  ?Consultants:  ?None ? ?Procedures: None ?Antimicrobials none ? ?Subjective: ?She reports having diarrhea no nausea vomiting reported has a headache. ? ?Objective: ?Vitals:  ? 04/11/21 2138 04/12/21 0504 04/12/21 0755 04/12/21 0939  ?BP: (!) 116/48 (!) 116/57  124/72  ?Pulse: 82 70  76  ?Resp: 15 15 (!) 26 17  ?Temp: 98 ?F (36.7 ?C) 97.9 ?F (36.6 ?C)  98.1 ?F (36.7 ?C)  ?TempSrc: Oral Oral  Oral  ?SpO2: 97% 96% 96% 97%  ?Weight:  135.5 kg    ?Height:      ? ? ?Intake/Output Summary (Last 24 hours) at 04/12/2021 1209 ?Last data filed at 04/12/2021 1046 ?Gross per 24 hour  ?Intake 2583.8 ml  ?Output 300 ml  ?Net 2283.8 ml  ? ? ?Filed Weights  ? 04/10/21 1706 04/11/21 0500 04/12/21 0504  ?Weight: 131.1 kg 131.1 kg 135.5 kg  ? ? ?Examination: ? ?General exam: Appears in mild distress due to nausea and diarrhea ?Respiratory system: Clear to auscultation. Respiratory effort normal. ?Cardiovascular system: S1 & S2 heard, RRR. No JVD, murmurs, rubs, gallops or clicks. No pedal edema. ?Gastrointestinal system: Abdomen is nondistended, soft and mild tender diffuse. No organomegaly or masses felt. Normal bowel sounds heard. ?Central nervous system: Alert and oriented. No focal neurological deficits. ?Extremities: Symmetric 5 x 5 power. ?Skin: No rashes, lesions or ulcers ?Psychiatry: Judgement and insight appear normal. Mood & affect appropriate.  ? ? ? ?Data Reviewed: I have personally reviewed following labs and imaging studies ? ?CBC: ?Recent Labs  ?Lab  04/10/21 ?1330 04/11/21 ?0442 04/12/21 ?0429  ?WBC 13.7* 12.1* 11.0*  ?NEUTROABS 9.6*  --   --   ?HGB 12.9 11.7* 10.6*  ?HCT 38.5 36.1 32.3*  ?MCV 91.8 93.8 95.6  ?PLT 273.0 249 211  ? ? ?Basic Metabolic Panel: ?Recent Labs  ?Lab 04/10/21 ?1330 04/10/21 ?1811 04/11/21 ?0442 04/11/21 ?1507 04/12/21 ?0429  ?NA 132* 133* 133* 135 136  ?K 2.4* 3.0* 2.7* 3.2* 2.9*  ?CL 88* 91* 95* 100 104  ?CO2 '29 28 26 26 25  '$ ?GLUCOSE 116* 130*  143* 150* 113*  ?BUN 38* 46* 44* 39* 29*  ?CREATININE 1.89* 2.06* 1.74* 1.26* 1.00  ?CALCIUM 10.0 9.5 8.9 8.7* 8.9  ?MG  --  2.2 2.4  --  2.4  ? ? ?GFR: ?Estimated Creatinine Clearance: 78.8 mL/min (by C-G formula based on SCr of 1 mg/dL). ?Liver Function Tests: ?Recent Labs  ?Lab 04/10/21 ?1811 04/11/21 ?0442  ?AST 18 20  ?ALT 15 18  ?ALKPHOS 69 67  ?BILITOT 1.0 0.8  ?PROT 8.1 7.4  ?ALBUMIN 4.1 3.2*  ? ? ?Recent Labs  ?Lab 04/10/21 ?1811  ?LIPASE 25  ? ? ?No results for input(s): AMMONIA in the last 168 hours. ?Coagulation Profile: ?No results for input(s): INR, PROTIME in the last 168 hours. ?Cardiac Enzymes: ?No results for input(s): CKTOTAL, CKMB, CKMBINDEX, TROPONINI in the last 168 hours. ?BNP (last 3 results) ?No results for input(s): PROBNP in the last 8760 hours. ?HbA1C: ?No results for input(s): HGBA1C in the last 72 hours. ?CBG: ?No results for input(s): GLUCAP in the last 168 hours. ?Lipid Profile: ?No results for input(s): CHOL, HDL, LDLCALC, TRIG, CHOLHDL, LDLDIRECT in the last 72 hours. ?Thyroid Function Tests: ?No results for input(s): TSH, T4TOTAL, FREET4, T3FREE, THYROIDAB in the last 72 hours. ?Anemia Panel: ?No results for input(s): VITAMINB12, FOLATE, FERRITIN, TIBC, IRON, RETICCTPCT in the last 72 hours. ?Sepsis Labs: ?No results for input(s): PROCALCITON, LATICACIDVEN in the last 168 hours. ? ?Recent Results (from the past 240 hour(s))  ?Resp Panel by RT-PCR (Flu A&B, Covid) Nasopharyngeal Swab     Status: None  ? Collection Time: 04/11/21  8:00 PM  ? Specimen: Nasopharyngeal Swab; Nasopharyngeal(NP) swabs in vial transport medium  ?Result Value Ref Range Status  ? SARS Coronavirus 2 by RT PCR NEGATIVE NEGATIVE Final  ?  Comment: (NOTE) ?SARS-CoV-2 target nucleic acids are NOT DETECTED. ? ?The SARS-CoV-2 RNA is generally detectable in upper respiratory ?specimens during the acute phase of infection. The lowest ?concentration of SARS-CoV-2 viral copies this assay can detect is ?138 copies/mL. A  negative result does not preclude SARS-Cov-2 ?infection and should not be used as the sole basis for treatment or ?other patient management decisions. A negative result may occur with  ?improper specimen collection/handling, submission of specimen other ?than nasopharyngeal swab, presence of viral mutation(s) within the ?areas targeted by this assay, and inadequate number of viral ?copies(<138 copies/mL). A negative result must be combined with ?clinical observations, patient history, and epidemiological ?information. The expected result is Negative. ? ?Fact Sheet for Patients:  ?EntrepreneurPulse.com.au ? ?Fact Sheet for Healthcare Providers:  ?IncredibleEmployment.be ? ?This test is no t yet approved or cleared by the [place] FDA and  ?has been authorized for detection and/or diagnosis of SARS-CoV-2 by ?FDA under an Emergency Use Authorization (EUA). This EUA will remain  ?in effect (meaning this test can be used) for the duration of the ?COVID-19 declaration under Section 564(b)(1) of the Act, 21 ?U.S.C.section 360bbb-3(b)(1), unless the authorization is terminated  ?  or revoked sooner.  ? ? ?  ? Influenza A by PCR NEGATIVE NEGATIVE Final  ? Influenza B by PCR NEGATIVE NEGATIVE Final  ?  Comment: (NOTE) ?The Xpert Xpress SARS-CoV-2/FLU/RSV plus assay is intended as an aid ?in the diagnosis of influenza from Nasopharyngeal swab specimens and ?should not be used as a sole basis for treatment. Nasal washings and ?aspirates are unacceptable for Xpert Xpress SARS-CoV-2/FLU/RSV ?testing. ? ?Fact Sheet for Patients: ?EntrepreneurPulse.com.au ? ?Fact Sheet for Healthcare Providers: ?IncredibleEmployment.be ? ?This test is not yet approved or cleared by the [place] FDA and ?has been authorized for detection and/or diagnosis of SARS-CoV-2 by ?FDA under an Emergency Use Authorization (EUA). This EUA will remain ?in effect (meaning this test can  be used) for the duration of the ?COVID-19 declaration under Section 564(b)(1) of the Act, 21 U.S.C. ?section 360bbb-3(b)(1), unless the authorization is terminated or ?revoked. ? ?Performed at [facility]

## 2021-04-12 NOTE — Plan of Care (Signed)
?  Problem: Education: ?Goal: Knowledge of General Education information will improve ?Description: Including pain rating scale, medication(s)/side effects and non-pharmacologic comfort measures ?Outcome: Progressing ?  ?Problem: Clinical Measurements: ?Goal: Will remain free from infection ?Outcome: Progressing ?Goal: Diagnostic test results will improve ?Outcome: Progressing ?Goal: Respiratory complications will improve ?Outcome: Progressing ?Goal: Cardiovascular complication will be avoided ?Outcome: Progressing ?  ?Problem: Activity: ?Goal: Risk for activity intolerance will decrease ?Outcome: Progressing ?  ?Problem: Elimination: ?Goal: Will not experience complications related to bowel motility ?Outcome: Progressing ?Goal: Will not experience complications related to urinary retention ?Outcome: Progressing ?  ?

## 2021-04-13 ENCOUNTER — Inpatient Hospital Stay (HOSPITAL_COMMUNITY): Payer: Medicare Other

## 2021-04-13 LAB — MAGNESIUM: Magnesium: 2.3 mg/dL (ref 1.7–2.4)

## 2021-04-13 LAB — CBC
HCT: 33.3 % — ABNORMAL LOW (ref 36.0–46.0)
Hemoglobin: 10.3 g/dL — ABNORMAL LOW (ref 12.0–15.0)
MCH: 30.7 pg (ref 26.0–34.0)
MCHC: 30.9 g/dL (ref 30.0–36.0)
MCV: 99.4 fL (ref 80.0–100.0)
Platelets: 206 10*3/uL (ref 150–400)
RBC: 3.35 MIL/uL — ABNORMAL LOW (ref 3.87–5.11)
RDW: 14.4 % (ref 11.5–15.5)
WBC: 11.2 10*3/uL — ABNORMAL HIGH (ref 4.0–10.5)
nRBC: 0 % (ref 0.0–0.2)

## 2021-04-13 LAB — BASIC METABOLIC PANEL
Anion gap: 4 — ABNORMAL LOW (ref 5–15)
BUN: 19 mg/dL (ref 8–23)
CO2: 24 mmol/L (ref 22–32)
Calcium: 8.8 mg/dL — ABNORMAL LOW (ref 8.9–10.3)
Chloride: 110 mmol/L (ref 98–111)
Creatinine, Ser: 0.8 mg/dL (ref 0.44–1.00)
GFR, Estimated: >60 mL/min (ref >60)
Glucose, Bld: 103 mg/dL — ABNORMAL HIGH (ref 70–99)
Potassium: 4.1 mmol/L (ref 3.5–5.1)
Sodium: 138 mmol/L (ref 135–145)

## 2021-04-13 MED ORDER — ISOSORBIDE DINITRATE 20 MG PO TABS: 20.0000 mg | ORAL_TABLET | Freq: Two times a day (BID) | ORAL | 3 refills | Status: DC

## 2021-04-13 MED ORDER — CARVEDILOL 12.5 MG PO TABS: 6.2500 mg | ORAL_TABLET | Freq: Every day | ORAL | 2 refills | Status: DC

## 2021-04-13 NOTE — Discharge Summary (Signed)
Physician Discharge Summary  ?[name] [id number]:[id number] DOB: 1960-12-09 DOA: 04/10/2021 ? ?PCP: Bartholome Bill, MD ? ?Admit date: 04/10/2021 ?Discharge date: 04/13/2021 ? ?Admitted From: home ?Disposition:  home ? ?Recommendations for Outpatient Follow-up:  ?Follow up with PCP in 1-2 weeks ?Please obtain BMP/CBC in one week  ?PCP please note that I have decreased the dose of Coreg and Isordil and stopped hydralazine and Cozaar due to soft blood pressure.  I have continued Demadex Aldactone metolazone the same doses. ? ?Home Health: None ?Equipment/Devices: None ?Discharge Condition: Stable ?CODE STATUS:Full code ?Diet recommendation: Cardiac ?Brief/Interim Summary: ?61 year old obese female with history of sleep apnea, diastolic heart failure, hypertension was having constipation for which she was prescribed laxatives MiraLAX and Linzess by her gastroenterologist.  ?She has also been on Ozempic ? patient began having multiple episodes of diarrhea nausea and vomiting. ?Patient was noted to have AKI and hypokalemia and was asked to come to the ER by gi dr nandigam ? ?Discharge Diagnoses:  ?Principal Problem: ?  Hypokalemia ?Active Problems: ?  Acute kidney injury ([place]) ?  Essential hypertension ?  ARF (acute renal failure) ([place]) ? ? ?  ?#1 AKI with hypokalemia due to dehydration from nausea and vomiting and diarrhea in the setting of diuretics ARB spironolactone and meloxicam.  These medications were on hold during the hospital stay and patient was hydrated with normal saline.  Concern for Ozempic reaction was high on the list so that was also held and was not restarted on discharge.  ?CT abdomen no acute findings ?  ?#2 severe hypokalemia her potassium was extremely low on admission at 2.7 this was aggressively replaced potassium was 4.1 on discharge.  This was thought to be due to severe diarrhea. ?  ?  ?#3 history of essential hypertension-her blood pressure was still soft on discharge.  She was on 7 different  antihypertensives.  All of them were not restarted on discharge.  Demadex Aldactone and metolazone were continued at the same doses.  Dose of Coreg and Isordil was decreased.  Hydralazine and Cozaar was stopped.  She is aware she needs to follow-up with PCP and cardiologist and may need to restart these medications as an outpatient once her blood pressure is able to tolerate.   ?  ?#4 history of diastolic heart failure currently stable restarted home medications  ? ?#5 history of sleep apnea on BiPAP ?  ?#6 hyperlipidemia on statins ?  ?#7 leukocytosis mild likely referred  with no evidence of infection ? ?Estimated body mass index is 53.62 kg/m? as calculated from the following: ?  Height as of this encounter: '5\' 3"'$  (1.6 m). ?  Weight as of this encounter: 137.3 kg. ? ?Discharge Instructions ? ?Discharge Instructions   ? ? Diet - low sodium heart healthy   Complete by: As directed ?  ? Increase activity slowly   Complete by: As directed ?  ? ?  ? ?Allergies as of 04/13/2021   ? ?   Reactions  ? Sulfonamide Derivatives Anaphylaxis, Swelling, Rash, Other (See Comments)  ? Swelling tongue and ear   ? Doxycycline Nausea And Vomiting  ? Latex Hives  ? Ciprofloxacin Rash  ? Fish Allergy Rash, Other (See Comments)  ? Only reaction to Mackerel.  No other issues with any type of fish or shellfish currently   ? Hydrocortisone Rash  ? Neomycin Rash  ? ?  ? ?  ?Medication List  ?  ? ?STOP taking these medications   ? ?hydrALAZINE  25 MG tablet ?Commonly known as: APRESOLINE ?  ?losartan 50 MG tablet ?Commonly known as: COZAAR ?  ?Ozempic (0.25 or 0.5 MG/DOSE) 2 MG/1.5ML Sopn ?Generic drug: Semaglutide(0.25 or 0.'5MG'$ /DOS) ?  ?Ozempic (1 MG/DOSE) 4 MG/3ML Sopn ?Generic drug: Semaglutide (1 MG/DOSE) ?  ? ?  ? ?TAKE these medications   ? ?acetaminophen 650 MG CR tablet ?Commonly known as: TYLENOL ?Take 650 mg by mouth every 8 (eight) hours as needed for pain (headache). ?What changed: Another medication with the same name was  removed. Continue taking this medication, and follow the directions you see here. ?  ?Azelastine-Fluticasone 137-50 MCG/ACT Susp ?Place 137 mcg into the nose in the morning, at noon, in the evening, and at bedtime. ?  ?benzonatate 200 MG capsule ?Commonly known as: TESSALON ?Take 200 mg by mouth 3 (three) times daily. ?  ?carvedilol 12.5 MG tablet ?Commonly known as: COREG ?Take 0.5 tablets (6.25 mg total) by mouth daily. ?What changed:  ?how much to take ?when to take this ?  ?dexlansoprazole 60 MG capsule ?Commonly known as: DEXILANT ?Take 60 mg by mouth daily. ?  ?dicyclomine 10 MG capsule ?Commonly known as: BENTYL ?Take 1 capsule (10 mg total) by mouth every 8 (eight) hours as needed for spasms. ?  ?EPINEPHrine 0.3 mg/0.3 mL Soaj injection ?Commonly known as: EPI-PEN ?Inject 0.3 mg into the muscle as needed for anaphylaxis. ?  ?ergocalciferol 1.25 MG (50000 UT) capsule ?Commonly known as: VITAMIN D2 ?Take 50,000 Units by mouth once a week. Fridays ?  ?Farxiga 10 MG Tabs tablet ?Generic drug: dapagliflozin propanediol ?Take 10 mg by mouth daily. ?  ?fluticasone 50 MCG/ACT nasal spray ?Commonly known as: FLONASE ?Place 1 spray into the nose in the morning, at noon, and at bedtime. ?  ?Flutter Devi ?1 application by Does not apply route as directed. ?  ?guaiFENesin-codeine 100-10 MG/5ML syrup ?Commonly known as: ROBITUSSIN AC ?Take 5 mLs by mouth 3 (three) times daily as needed for cough. ?  ?ipratropium 0.06 % nasal spray ?Commonly known as: ATROVENT ?Place 2 sprays into both nostrils 3 (three) times daily. ?  ?ipratropium-albuterol 0.5-2.5 (3) MG/3ML Soln ?Commonly known as: DUONEB ?Take 3 mLs by nebulization in the morning, at noon, in the evening, and at bedtime. ?  ?isosorbide dinitrate 20 MG tablet ?Commonly known as: ISORDIL ?Take 1 tablet (20 mg total) by mouth 2 (two) times daily. ?What changed: when to take this ?  ?levocetirizine 5 MG tablet ?Commonly known as: XYZAL ?Take 5 mg by mouth every evening. ?   ?Linzess 290 MCG Caps capsule ?Generic drug: linaclotide ?TAKE 1 CAPSULE BY MOUTH EVERY DAY BEFORE BREAKFAST ?What changed: See the new instructions. ?  ?meclizine 25 MG tablet ?Commonly known as: ANTIVERT ?Take 25 mg by mouth 2 (two) times daily as needed for dizziness. ?  ?meloxicam 15 MG tablet ?Commonly known as: MOBIC ?Take 15 mg by mouth at bedtime. ?  ?metolazone 2.5 MG tablet ?Commonly known as: ZAROXOLYN ?Take 1 tablet (2.5 mg total) by mouth daily as needed (FOR OVERNIGHT WEIGHT GAIN OF 3 lbs. or more). ?  ?montelukast 10 MG tablet ?Commonly known as: SINGULAIR ?TAKE 1 TABLET(10 MG) BY MOUTH AT BEDTIME ?What changed: See the new instructions. ?  ?pantoprazole 40 MG tablet ?Commonly known as: PROTONIX ?Take 40 mg by mouth daily. ?  ?Potassium Chloride ER 20 MEQ Tbcr ?TAKE 3 TABLETS BY MOUTH 3 TIMES A DAY ?What changed: See the new instructions. ?  ?rosuvastatin 20 MG tablet ?Commonly known  as: CRESTOR ?TAKE 1 TABLET BY MOUTH EVERY DAY ?  ?spironolactone 25 MG tablet ?Commonly known as: ALDACTONE ?TAKE 1/2 TABLETS BY MOUTH 2 TIMES DAILY. ?What changed: See the new instructions. ?  ?topiramate 25 MG tablet ?Commonly known as: TOPAMAX ?Take 25 mg by mouth 2 (two) times daily as needed (for migraines). ?  ?torsemide 20 MG tablet ?Commonly known as: DEMADEX ?TAKE 2 TABLETS BY MOUTH 2 TIMES DAILY. ?What changed: when to take this ?  ?traMADol 50 MG tablet ?Commonly known as: ULTRAM ?Take 1 tablet (50 mg total) by mouth 3 (three) times daily as needed for moderate pain. ?  ?Trelegy Ellipta 200-62.5-25 MCG/ACT Aepb ?Generic drug: Fluticasone-Umeclidin-Vilant ?Inhale 1 puff into the lungs daily. ?  ?vitamin C 250 MG tablet ?Commonly known as: ASCORBIC ACID ?Take 250 mg by mouth daily. ?  ? ?  ? ? ?Allergies  ?Allergen Reactions  ? Sulfonamide Derivatives Anaphylaxis, Swelling, Rash and Other (See Comments)  ?  Swelling tongue and ear   ? Doxycycline Nausea And Vomiting  ? Latex Hives  ? Ciprofloxacin Rash  ? Fish  Allergy Rash and Other (See Comments)  ?  Only reaction to Mackerel.  No other issues with any type of fish or shellfish currently   ? Hydrocortisone Rash  ? Neomycin Rash  ? ? ?Consultations: ?none ? ? ?Pr

## 2021-04-16 ENCOUNTER — Emergency Department (HOSPITAL_BASED_OUTPATIENT_CLINIC_OR_DEPARTMENT_OTHER)
Admission: EM | Admit: 2021-04-16 | Discharge: 2021-04-16 | Disposition: A | Discharge status: Home / Self Care | Payer: 59 | Attending: Emergency Medicine | Admitting: Emergency Medicine

## 2021-04-16 ENCOUNTER — Encounter (HOSPITAL_BASED_OUTPATIENT_CLINIC_OR_DEPARTMENT_OTHER): Payer: Self-pay | Admitting: Emergency Medicine

## 2021-04-16 ENCOUNTER — Telehealth (HOSPITAL_BASED_OUTPATIENT_CLINIC_OR_DEPARTMENT_OTHER): Payer: Self-pay | Admitting: Cardiovascular Disease

## 2021-04-16 ENCOUNTER — Emergency Department (HOSPITAL_BASED_OUTPATIENT_CLINIC_OR_DEPARTMENT_OTHER): Payer: 59 | Admitting: Radiology

## 2021-04-16 ENCOUNTER — Other Ambulatory Visit: Payer: Self-pay

## 2021-04-16 DIAGNOSIS — I2510 Atherosclerotic heart disease of native coronary artery without angina pectoris: Secondary | ICD-10-CM | POA: Insufficient documentation

## 2021-04-16 DIAGNOSIS — R0789 Other chest pain: Secondary | ICD-10-CM | POA: Insufficient documentation

## 2021-04-16 DIAGNOSIS — E876 Hypokalemia: Secondary | ICD-10-CM | POA: Diagnosis not present

## 2021-04-16 DIAGNOSIS — R079 Chest pain, unspecified: Secondary | ICD-10-CM | POA: Diagnosis present

## 2021-04-16 DIAGNOSIS — Z79899 Other long term (current) drug therapy: Secondary | ICD-10-CM | POA: Diagnosis not present

## 2021-04-16 DIAGNOSIS — Z8616 Personal history of COVID-19: Secondary | ICD-10-CM | POA: Insufficient documentation

## 2021-04-16 DIAGNOSIS — Z7951 Long term (current) use of inhaled steroids: Secondary | ICD-10-CM | POA: Insufficient documentation

## 2021-04-16 DIAGNOSIS — I5032 Chronic diastolic (congestive) heart failure: Secondary | ICD-10-CM | POA: Insufficient documentation

## 2021-04-16 DIAGNOSIS — I110 Hypertensive heart disease with heart failure: Secondary | ICD-10-CM | POA: Insufficient documentation

## 2021-04-16 DIAGNOSIS — Z91040 Latex allergy status: Secondary | ICD-10-CM | POA: Diagnosis not present

## 2021-04-16 DIAGNOSIS — J449 Chronic obstructive pulmonary disease, unspecified: Secondary | ICD-10-CM | POA: Insufficient documentation

## 2021-04-16 LAB — CBC
HCT: 37.7 % (ref 36.0–46.0)
Hemoglobin: 12.4 g/dL (ref 12.0–15.0)
MCH: 30.6 pg (ref 26.0–34.0)
MCHC: 32.9 g/dL (ref 30.0–36.0)
MCV: 93.1 fL (ref 80.0–100.0)
Platelets: 268 10*3/uL (ref 150–400)
RBC: 4.05 MIL/uL (ref 3.87–5.11)
RDW: 14.3 % (ref 11.5–15.5)
WBC: 11.5 10*3/uL — ABNORMAL HIGH (ref 4.0–10.5)
nRBC: 0 % (ref 0.0–0.2)

## 2021-04-16 LAB — BASIC METABOLIC PANEL
Anion gap: 12 (ref 5–15)
BUN: 25 mg/dL — ABNORMAL HIGH (ref 8–23)
CO2: 30 mmol/L (ref 22–32)
Calcium: 10.3 mg/dL (ref 8.9–10.3)
Chloride: 95 mmol/L — ABNORMAL LOW (ref 98–111)
Creatinine, Ser: 1.15 mg/dL — ABNORMAL HIGH (ref 0.44–1.00)
GFR, Estimated: 54 mL/min — ABNORMAL LOW (ref >60)
Glucose, Bld: 120 mg/dL — ABNORMAL HIGH (ref 70–99)
Potassium: 3.3 mmol/L — ABNORMAL LOW (ref 3.5–5.1)
Sodium: 137 mmol/L (ref 135–145)

## 2021-04-16 LAB — TROPONIN I (HIGH SENSITIVITY)
Troponin I (High Sensitivity): 3 ng/L (ref <18)
Troponin I (High Sensitivity): 3 ng/L (ref <18)

## 2021-04-16 MED ORDER — POTASSIUM CHLORIDE CRYS ER 20 MEQ PO TBCR
40.0000 meq | EXTENDED_RELEASE_TABLET | Freq: Once | ORAL | Status: AC
  Administered 2021-04-16: 40 meq via ORAL
  Filled 2021-04-16: qty 2

## 2021-04-16 MED ORDER — SODIUM CHLORIDE 0.9 % IV BOLUS: 500.0000 mL | Freq: Once | INTRAVENOUS | Status: DC

## 2021-04-16 NOTE — Telephone Encounter (Signed)
Triage Stat call for chest pain  ? ?BP now on the phone 146/84, 93  ? ?Patient states when she talks or moves her chest hurt. Continuous chest pain. Recent BP med change.  ? ?Carvedilol and imdur- am  ? ?Carvedilol and imdur- pm  ? ? ?Yesterday 129/74 ? ? ? ?Patient advised to report to the emergency department. Patient states that she was advised to follow up with Dr. Oval Linsey once she was out of the hospital. Advised patient that after she is seen in the ED, she can call back to schedule an appointment.  ? ? ? ?

## 2021-04-16 NOTE — ED Triage Notes (Signed)
Pt d/c'd from hospital on Friday following hypokalemia.  Pt concerned her BP was too low at home ("less than 110") - pt reports BP 154/94 at home and states she called her PMD to report low BP and advised to come to Ed for evaluation.  Pt d/c instructions reviewed and education provided to pt regarding "systolic BP" and reassurance offered that her systolic BP was not "too low."   ? ?Pt reports left side CP that started this morning.  Denies CP at present, reports pain comes and goes. ?

## 2021-04-16 NOTE — ED Notes (Signed)
Discharge instructions, medications, and follow up care reviewed and explained. Pt verbalized understanding. ?

## 2021-04-16 NOTE — ED Provider Notes (Signed)
?[facility] ?Provider Note ? ? ?CSN: [id number] ?Arrival date & time: 04/16/21  1230 ? ?  ? ?History ? ?Chief Complaint  ?Patient presents with  ? Chest Pain  ? ? ?[name] is a 61 y.o. female. ? ?This is a 61 y.o. female with significant medical history as below, including hyperlipidemia, hypertension, IBS, diastolic heart failure, COPD who presents to the ED with complaint of chest pain.  Patient reports this morning she had a brief episode of chest pain.  She took half of a Coreg and the chest pain resolved.  Pain is not resumed.  No dyspnea, no nausea, no lightheadedness, diaphoresis.  Not worse with exertion.  Described as tightness, pulling sensation to left side chest wall.  Not radiating. ? ?Patient was recently discharged, was there with hypokalemia, BP meds were adjusted.  Patient attempted to follow-up with her cardiologist office regarding blood pressure medications and advised to come to the ER secondary to ongoing chest pain.  She is worried about her blood pressure regimen.  ? ? ? ?Past Medical History: ?04/11/2014: Acute on chronic diastolic CHF (congestive heart failure)  ?(HCC) ?02/04/2007: Acute suppurative otitis media without spontaneous  ?rupture of eardrum ?    Comment:  Centricity Description: OTITIS MEDIA, SUPPURATIVE,  ?             ACUTE, BILATERAL Qualifier: Diagnosis of  By: [name] MD, ?             Hilliard Clark A  Centricity Description: OTITIS MEDIA,  ?             SUPPURATIVE, ACUTE Qualifier: Diagnosis of  By: [name]  ?             MD, Hilliard Clark A  ?No date: Allergic rhinitis ?No date: Allergy ?No date: Anemia ?No date: Anxiety ?No date: Arthritis ?02/21/2021: Atherosclerosis of aorta ([place]) ?No date: CAD (coronary artery disease) ?    Comment:  LHC (11/29/1999):  EF 60%; no significant CAD. ?No date: Carpal tunnel syndrome ?06/09/2006: Carpal tunnel syndrome ?    Comment:  Qualifier: Diagnosis of  By: Larose Kells   ?No date: Chronic diastolic CHF (congestive  heart failure) ([place]) ?    Comment:  a. Echo (01/21/11): Vigorous LVF, EF 65-70%, no RWMA, Gr ?             2 DD. b.  Echo (12/15): Moderate LVH, EF 60-65%, grade 2  ?             diastolic dysfunction, mild LAE, normal RV function c.  ?             01/2015: echo showing EF of 60-65% with mild LVH ?No date: Common migraine ?No date: COPD (chronic obstructive pulmonary disease) ([place]) ?No date: COVID-19 ?    Comment:  x 2 ?No date: Diastolic heart failure ([place]) ?No date: Diverticulitis ?No date: Dyspnea ?No date: GERD (gastroesophageal reflux disease) ?No date: Hepatic steatosis ?No date: Hepatomegaly ?No date: Hiatal hernia ?No date: History of cardiomegaly ?No date: Hyperlipidemia ?No date: Hypertension ?No date: IBS (irritable bowel syndrome) ?No date: Internal hemorrhoids ?No date: Long COVID ?No date: Metabolic syndrome ?No date: Morbid obesity ([place]) ?No date: OSA (obstructive sleep apnea) ?    Comment:  CPAP dependent ?03/01/2009: PNEUMONIA, ORGAN UNSPECIFIED ?    Comment:  Qualifier: Diagnosis of  By: [name] CMA, [name]   ?03/10/2012: Sinusitis, chronic ?    Comment:  CT sinuses 02/2012:  Chronic sinusitis with  small A/F  ?             levels >> rx by ENT with prolonged augmentin F/u ct  ?             sinuses 04/2012:  Persistent sinus thickening >> rx with  ?             levaquin and clinda for 30 days.  Told to return for  ?             sinus scan in 6 weeks >> no showed for followup visit.   ?             CT sinuses 11/2013:  Acute and chronic sinusitis noted.   ?No date: Sleep apnea ?    Comment:  bi pap ? ?Past Surgical History: ?No date: ABDOMINAL HYSTERECTOMY ?06/22/2018: BIOPSY ?    Comment:  Procedure: BIOPSY;  Surgeon: Mauri Pole, MD;   ?             Location: [facility];  Service: Endoscopy;; ?07/28/2018: BIOPSY ?    Comment:  Procedure: BIOPSY;  Surgeon: Mauri Pole, MD;   ?             Location: [facility];  Service: Endoscopy;; ?No date: CESAREAN SECTION ?No date:  COLONOSCOPY ?05/05/2017: COLONOSCOPY WITH PROPOFOL; N/A ?    Comment:  Procedure: COLONOSCOPY WITH PROPOFOL;  Surgeon:  ?             Mauri Pole, MD;  Location: [facility];   ?             Service: Endoscopy;  Laterality: N/A; ?06/22/2018: ESOPHAGOGASTRODUODENOSCOPY (EGD) WITH PROPOFOL; N/A ?    Comment:  Procedure: ESOPHAGOGASTRODUODENOSCOPY (EGD) WITH  ?             PROPOFOL;  Surgeon: Mauri Pole, MD;  Location:  ?             [facility];  Service: Endoscopy;  Laterality: N/A; ?07/28/2018: ESOPHAGOGASTRODUODENOSCOPY (EGD) WITH PROPOFOL; N/A ?    Comment:  Procedure: ESOPHAGOGASTRODUODENOSCOPY (EGD) WITH  ?             PROPOFOL;  Surgeon: Mauri Pole, MD;  Location:  ?             [facility];  Service: Endoscopy;  Laterality: N/A; ?No date: NASAL SINUS SURGERY ?No date: PARTIAL HYSTERECTOMY ?05/28/2019: SINUS ENDO WITH FUSION; Left ?    Comment:  Procedure: SINUS ENDO WITH FUSION;  Surgeon: Wilburn Cornelia,  ?             Shanon Brow, MD;  Location: [facility];  Service: ENT;  Laterality:  ?             Left; ?No date: TONSILLECTOMY ?No date: TUBAL LIGATION ?05/28/2019: TURBINATE REDUCTION; Bilateral ?    Comment:  Procedure: TURBINATE REDUCTION;  Surgeon: Wilburn Cornelia,  ?             Shanon Brow, MD;  Location: [facility];  Service: ENT;  Laterality:  ?             Bilateral; ?No date: UVULOPALATOPHARYNGOPLASTY  ? ? ? ?Chest Pain ?Associated symptoms: no abdominal pain, no cough, no dysphagia, no fever, no headache, no nausea, no palpitations, no shortness of breath and no vomiting   ? ?  ? ?Home Medications ?Prior to Admission medications   ?Medication Sig Start Date End Date Taking? Authorizing Provider  ?acetaminophen (TYLENOL) 650  MG CR tablet Take 650 mg by mouth every 8 (eight) hours as needed for pain (headache).    [provider]  ?Azelastine-Fluticasone 137-50 MCG/ACT SUSP Place 137 mcg into the nose in the morning, at noon, in the evening, and at bedtime.    [provider]  ?benzonatate  (TESSALON) 200 MG capsule Take 200 mg by mouth 3 (three) times daily. 03/31/21   [provider]  ?carvedilol (COREG) 12.5 MG tablet Take 0.5 tablets (6.25 mg total) by mouth daily. 04/13/21   Georgette Shell, MD  ?dexlansoprazole (DEXILANT) 60 MG capsule Take 60 mg by mouth daily.    [provider]  ?dicyclomine (BENTYL) 10 MG capsule Take 1 capsule (10 mg total) by mouth every 8 (eight) hours as needed for spasms. 04/24/20   Mauri Pole, MD  ?EPINEPHrine 0.3 mg/0.3 mL IJ SOAJ injection Inject 0.3 mg into the muscle as needed for anaphylaxis. 12/19/19   [provider]  ?ergocalciferol (VITAMIN D2) 1.25 MG (50000 UT) capsule Take 50,000 Units by mouth once a week. Fridays 05/18/20   [provider]  ?FARXIGA 10 MG TABS tablet Take 10 mg by mouth daily. 10/18/20   [provider]  ?fluticasone (FLONASE) 50 MCG/ACT nasal spray Place 1 spray into the nose in the morning, at noon, and at bedtime. 03/03/20   [provider]  ?guaiFENesin-codeine (ROBITUSSIN AC) 100-10 MG/5ML syrup Take 5 mLs by mouth 3 (three) times daily as needed for cough. 03/06/21   Quintella Reichert, MD  ?ipratropium (ATROVENT) 0.06 % nasal spray Place 2 sprays into both nostrils 3 (three) times daily.    [provider]  ?ipratropium-albuterol (DUONEB) 0.5-2.5 (3) MG/3ML SOLN Take 3 mLs by nebulization in the morning, at noon, in the evening, and at bedtime.    [provider]  ?isosorbide dinitrate (ISORDIL) 20 MG tablet Take 1 tablet (20 mg total) by mouth 2 (two) times daily. 04/13/21   Georgette Shell, MD  ?levocetirizine (XYZAL) 5 MG tablet Take 5 mg by mouth every evening.    [provider]  ?LINZESS 290 MCG CAPS capsule TAKE 1 CAPSULE BY MOUTH EVERY DAY BEFORE BREAKFAST ?Patient taking differently: Take 290 mcg by mouth daily before breakfast. 01/23/21   Zehr, Laban Emperor, PA-C  ?meclizine (ANTIVERT) 25 MG tablet Take 25 mg by mouth 2 (two) times daily as  needed for dizziness.    [provider]  ?meloxicam (MOBIC) 15 MG tablet Take 15 mg by mouth at bedtime. 11/20/20   [provider]  ?metolazone (ZAROXOLYN) 2.5 MG tablet Take 1 tablet (2.5 mg total) by

## 2021-04-16 NOTE — Discharge Instructions (Addendum)
Dr. Oval Linsey office will contact you regarding your blood pressure medication ? ?Please continue taking medications as prescribed ?Continue taking your potassium supplements ? ?It was a pleasure caring for you today in the emergency department. ? ?Please return to the emergency department for any worsening or worrisome symptoms. ? ? ? ?

## 2021-04-16 NOTE — Telephone Encounter (Signed)
Pt c/o of Chest Pain: STAT if CP now or developed within 24 hours ? ?1. Are you having CP right now? Yes  ? ?2. Are you experiencing any other symptoms (ex. SOB, nausea, vomiting, sweating)?  ? ?3. How long have you been experiencing CP? Started this morning ? ?4. Is your CP continuous or coming and going? continuous ? ?5. Have you taken Nitroglycerin? No. Patient does not have any  ??  ?

## 2021-04-20 ENCOUNTER — Other Ambulatory Visit: Payer: Self-pay | Admitting: Gastroenterology

## 2021-05-02 ENCOUNTER — Telehealth (HOSPITAL_COMMUNITY): Payer: Self-pay | Admitting: Vascular Surgery

## 2021-05-02 NOTE — Telephone Encounter (Signed)
Lvm giving pt new chf appt 05/31/21 @ 1120 , asked pt to call back to confirm appt ?

## 2021-05-03 ENCOUNTER — Other Ambulatory Visit (HOSPITAL_BASED_OUTPATIENT_CLINIC_OR_DEPARTMENT_OTHER): Payer: Self-pay | Admitting: Cardiovascular Disease

## 2021-05-21 ENCOUNTER — Other Ambulatory Visit: Payer: Self-pay | Admitting: Gastroenterology

## 2021-05-25 ENCOUNTER — Other Ambulatory Visit: Payer: Self-pay | Admitting: Endocrinology

## 2021-05-25 DIAGNOSIS — E041 Nontoxic single thyroid nodule: Secondary | ICD-10-CM

## 2021-05-31 ENCOUNTER — Encounter (HOSPITAL_COMMUNITY): Payer: Self-pay | Admitting: Internal Medicine

## 2021-05-31 ENCOUNTER — Ambulatory Visit (HOSPITAL_COMMUNITY)
Admission: RE | Admit: 2021-05-31 | Discharge: 2021-05-31 | Disposition: A | Discharge status: Home / Self Care | Payer: 59 | Source: Ambulatory Visit | Attending: Internal Medicine | Admitting: Internal Medicine

## 2021-05-31 VITALS — BP 130/84 | HR 78 | Wt 291.2 lb

## 2021-05-31 DIAGNOSIS — I110 Hypertensive heart disease with heart failure: Secondary | ICD-10-CM | POA: Diagnosis not present

## 2021-05-31 DIAGNOSIS — K449 Diaphragmatic hernia without obstruction or gangrene: Secondary | ICD-10-CM | POA: Insufficient documentation

## 2021-05-31 DIAGNOSIS — I5032 Chronic diastolic (congestive) heart failure: Secondary | ICD-10-CM | POA: Insufficient documentation

## 2021-05-31 DIAGNOSIS — G4733 Obstructive sleep apnea (adult) (pediatric): Secondary | ICD-10-CM | POA: Insufficient documentation

## 2021-05-31 NOTE — Progress Notes (Signed)
? ?ADVANCED HF CLINIC CONSULT NOTE ? ?Referring Physician: Bartholome Bill, MD ?Primary Care: Bartholome Bill, MD ?Primary Cardiologist:Tiffany Oval Linsey, MD ? ? ?HPI: ? ?[name] is a 61 y.o. morbidly obese woman with HFpEF, HTN, HL, OSA on BiPAP  who is referred by Dr. Luciana Axe for a second opinion regarding treatment of her HF. ? ?I reviewed recent notes (including Dr. Blenda Mounts excellent Cardiology note from 02/21/21 and the recent hospital d/c summary on 04/13/21) and the following clinic studies and medication list below.  ? ?Echo 2/19  EF 65 to 70% with grade 1 diastolic dysfunction.  ?Lexiscan Myoview 3/19  LVEF 77% and no ischemia.   ? ?Echo 3/21 EF > 75% RV normal. Diastolic dysfunction indeterminate ? ?CT-A 10/2020 which showed normal coronary arteries and a calcium score of 0. She did have aortic atherosclerosis and a small hiatal hernia.  ? ?Recently admitted 3/23 with AKI and severe hypokalemia felt to be related to her diuretics and possible Ozempic use  ? ?Current Meds  ?Medication Sig  ? acetaminophen (TYLENOL) 650 MG CR tablet Take 650 mg by mouth every 8 (eight) hours as needed for pain (headache).  ? Azelastine-Fluticasone 137-50 MCG/ACT SUSP Place 137 mcg into the nose in the morning, at noon, in the evening, and at bedtime.  ? benzonatate (TESSALON) 200 MG capsule Take 200 mg by mouth 3 (three) times daily.  ? carvedilol (COREG) 12.5 MG tablet Take 0.5 tablets (6.25 mg total) by mouth daily.  ? dexlansoprazole (DEXILANT) 60 MG capsule TAKE 1 CAPSULE (60 MG TOTAL) BY MOUTH DAILY. DAILY IN THE MORNING  ? dicyclomine (BENTYL) 10 MG capsule Take 1 capsule (10 mg total) by mouth every 8 (eight) hours as needed for spasms.  ? EPINEPHrine 0.3 mg/0.3 mL IJ SOAJ injection Inject 0.3 mg into the muscle as needed for anaphylaxis.  ? ergocalciferol (VITAMIN D2) 1.25 MG (50000 UT) capsule Take 50,000 Units by mouth once a week. Fridays  ? FARXIGA 10 MG TABS tablet Take 10 mg by mouth daily.  ?  fluticasone (FLONASE) 50 MCG/ACT nasal spray Place 1 spray into the nose in the morning, at noon, and at bedtime.  ? guaiFENesin-codeine (ROBITUSSIN AC) 100-10 MG/5ML syrup Take 5 mLs by mouth 3 (three) times daily as needed for cough.  ? ipratropium (ATROVENT) 0.06 % nasal spray Place 2 sprays into both nostrils 3 (three) times daily.  ? ipratropium-albuterol (DUONEB) 0.5-2.5 (3) MG/3ML SOLN Take 3 mLs by nebulization in the morning, at noon, in the evening, and at bedtime.  ? isosorbide dinitrate (ISORDIL) 20 MG tablet Take 1 tablet (20 mg total) by mouth 2 (two) times daily.  ? levocetirizine (XYZAL) 5 MG tablet Take 5 mg by mouth every evening.  ? LINZESS 290 MCG CAPS capsule TAKE 1 CAPSULE BY MOUTH EVERY DAY BEFORE BREAKFAST  ? meclizine (ANTIVERT) 25 MG tablet Take 25 mg by mouth 2 (two) times daily as needed for dizziness.  ? meloxicam (MOBIC) 15 MG tablet Take 15 mg by mouth at bedtime.  ? metolazone (ZAROXOLYN) 2.5 MG tablet Take 1 tablet (2.5 mg total) by mouth daily as needed (FOR OVERNIGHT WEIGHT GAIN OF 3 lbs. or more).  ? montelukast (SINGULAIR) 10 MG tablet TAKE 1 TABLET(10 MG) BY MOUTH AT BEDTIME  ? pantoprazole (PROTONIX) 40 MG tablet Take 40 mg by mouth daily.  ? Potassium Chloride ER 20 MEQ TBCR TAKE 3 TABLETS BY MOUTH 3 TIMES A DAY  ? Respiratory Therapy Supplies (FLUTTER) DEVI 1  application by Does not apply route as directed.  ? rosuvastatin (CRESTOR) 20 MG tablet TAKE 1 TABLET BY MOUTH EVERY DAY  ? spironolactone (ALDACTONE) 25 MG tablet TAKE 1/2 TABLETS BY MOUTH 2 TIMES DAILY.  ? topiramate (TOPAMAX) 25 MG tablet Take 25 mg by mouth 2 (two) times daily as needed (for migraines).   ? torsemide (DEMADEX) 20 MG tablet TAKE 2 TABLETS BY MOUTH 2 TIMES DAILY.  ? traMADol (ULTRAM) 50 MG tablet Take 1 tablet (50 mg total) by mouth 3 (three) times daily as needed for moderate pain.  ? TRELEGY ELLIPTA 200-62.5-25 MCG/INH AEPB Inhale 1 puff into the lungs daily.  ? vitamin C (ASCORBIC ACID) 250 MG  tablet Take 250 mg by mouth daily.  ?  ? ?She is here with her [name]. I introduced myself and asked her if there was a problem with her previous Cardiac care and why she changed physicians from Dr. Percival Spanish to Dr. Oval Linsey. She told me that "the Chief of the Cardiology staff took her away from Dr. Percival Spanish because he was using the wrong medications on her". When I tried to explain that Dr. Percival Spanish was the Chief of our Cardiology Section. Things became a bit contentious. She then asked me if she should go back to [place] to see doctors who knew what they were doing.  ? ?As the conversation progressed I told her that I had reviewed several of her previous encounters and the medications she has been on and felt her previous care was quite good but that patients with HF can have their BP go up and down and can have changes in renal function based on their diuretics and we have to be very careful to get things right but problems can't always be avoided.  ? ?She felt that I was protecting my partners and wasn't going to be impartial. I told her that I do support my partners because they are very good doctors and from what I saw had been doing a great job despite some of the problems she has had.  ? ?As the conversation continued, things got more contentious and I told her that it was probably best for both of [place] if she found a doctor that she had trust in and could build a good relationship with. I suggested that if she wanted an excellent cardiologist that was not affiliated with our practice she should contact Dr. Adrian Prows or one of his partners who were independent from [facility] and were very skilled.  ? ?We eventually agreed to this approach. Both her and her [name] thanked me and I told them she would not be charged for this visit.  ? ?At the end of the visit. It was noticed by one of our student observers that her goddaugther had taped the entire conversation on her phone without letting anyone know or asking  permission.  ? ?Glori Bickers, MD  ?2:15 PM ? ? ? ?

## 2021-05-31 NOTE — Patient Instructions (Signed)
Good to see you today! ? ?Follow up with Dr. Einar Gip ?

## 2021-05-31 NOTE — Progress Notes (Signed)
Per Dr Haroldine Laws pt will not be following up in [facility], he has given pt and family information for Dr Einar Gip at [facility]. No charge for facility fee or provider visit. ?

## 2021-06-01 ENCOUNTER — Ambulatory Visit
Admission: RE | Admit: 2021-06-01 | Discharge: 2021-06-01 | Disposition: A | Discharge status: Home / Self Care | Payer: 59 | Source: Ambulatory Visit | Attending: Endocrinology | Admitting: Endocrinology

## 2021-06-01 DIAGNOSIS — E041 Nontoxic single thyroid nodule: Secondary | ICD-10-CM

## 2021-06-06 ENCOUNTER — Telehealth: Payer: Self-pay | Admitting: Gastroenterology

## 2021-06-06 NOTE — Telephone Encounter (Signed)
Spoke with Ms Dejonge. Rescheduled her follow up appointment to 08/08/21. She accepts this appointment and thanks me for the assistance. ?

## 2021-06-06 NOTE — Telephone Encounter (Signed)
Patient called asking to speak with you.  Please call her at your convenience.  Thank you. ?

## 2021-06-07 ENCOUNTER — Telehealth: Payer: Self-pay | Admitting: Cardiovascular Disease

## 2021-06-07 NOTE — Telephone Encounter (Signed)
Patient states she is no longer under care of the office and does not need to schedule further appointments.

## 2021-06-08 ENCOUNTER — Encounter (HOSPITAL_BASED_OUTPATIENT_CLINIC_OR_DEPARTMENT_OTHER): Payer: Self-pay | Admitting: Emergency Medicine

## 2021-06-08 ENCOUNTER — Other Ambulatory Visit (HOSPITAL_BASED_OUTPATIENT_CLINIC_OR_DEPARTMENT_OTHER): Payer: Self-pay

## 2021-06-08 ENCOUNTER — Emergency Department (HOSPITAL_BASED_OUTPATIENT_CLINIC_OR_DEPARTMENT_OTHER)
Admission: EM | Admit: 2021-06-08 | Discharge: 2021-06-08 | Disposition: A | Discharge status: Home / Self Care | Payer: 59 | Attending: Emergency Medicine | Admitting: Emergency Medicine

## 2021-06-08 ENCOUNTER — Other Ambulatory Visit: Payer: Self-pay

## 2021-06-08 ENCOUNTER — Emergency Department (HOSPITAL_BASED_OUTPATIENT_CLINIC_OR_DEPARTMENT_OTHER): Payer: 59

## 2021-06-08 DIAGNOSIS — K580 Irritable bowel syndrome with diarrhea: Secondary | ICD-10-CM | POA: Insufficient documentation

## 2021-06-08 DIAGNOSIS — R110 Nausea: Secondary | ICD-10-CM | POA: Diagnosis not present

## 2021-06-08 DIAGNOSIS — K589 Irritable bowel syndrome without diarrhea: Secondary | ICD-10-CM

## 2021-06-08 DIAGNOSIS — R197 Diarrhea, unspecified: Secondary | ICD-10-CM

## 2021-06-08 DIAGNOSIS — Z91040 Latex allergy status: Secondary | ICD-10-CM | POA: Insufficient documentation

## 2021-06-08 DIAGNOSIS — R109 Unspecified abdominal pain: Secondary | ICD-10-CM | POA: Diagnosis present

## 2021-06-08 LAB — COMPREHENSIVE METABOLIC PANEL
ALT: 20 U/L (ref 0–44)
AST: 26 U/L (ref 15–41)
Albumin: 4.3 g/dL (ref 3.5–5.0)
Alkaline Phosphatase: 100 U/L (ref 38–126)
Anion gap: 17 — ABNORMAL HIGH (ref 5–15)
BUN: 35 mg/dL — ABNORMAL HIGH (ref 8–23)
CO2: 31 mmol/L (ref 22–32)
Calcium: 10.1 mg/dL (ref 8.9–10.3)
Chloride: 86 mmol/L — ABNORMAL LOW (ref 98–111)
Creatinine, Ser: 1.85 mg/dL — ABNORMAL HIGH (ref 0.44–1.00)
GFR, Estimated: 30 mL/min — ABNORMAL LOW (ref >60)
Glucose, Bld: 142 mg/dL — ABNORMAL HIGH (ref 70–99)
Potassium: 2.9 mmol/L — ABNORMAL LOW (ref 3.5–5.1)
Sodium: 134 mmol/L — ABNORMAL LOW (ref 135–145)
Total Bilirubin: 1 mg/dL (ref 0.3–1.2)
Total Protein: 9.1 g/dL — ABNORMAL HIGH (ref 6.5–8.1)

## 2021-06-08 LAB — CBC
HCT: 43.4 % (ref 36.0–46.0)
Hemoglobin: 14.4 g/dL (ref 12.0–15.0)
MCH: 30.2 pg (ref 26.0–34.0)
MCHC: 33.2 g/dL (ref 30.0–36.0)
MCV: 91 fL (ref 80.0–100.0)
Platelets: 309 10*3/uL (ref 150–400)
RBC: 4.77 MIL/uL (ref 3.87–5.11)
RDW: 13.4 % (ref 11.5–15.5)
WBC: 11.7 10*3/uL — ABNORMAL HIGH (ref 4.0–10.5)
nRBC: 0 % (ref 0.0–0.2)

## 2021-06-08 LAB — URINALYSIS, ROUTINE W REFLEX MICROSCOPIC
Bilirubin Urine: NEGATIVE
Glucose, UA: NEGATIVE mg/dL
Hgb urine dipstick: NEGATIVE
Ketones, ur: NEGATIVE mg/dL
Leukocytes,Ua: NEGATIVE
Nitrite: NEGATIVE
Protein, ur: NEGATIVE mg/dL
Specific Gravity, Urine: 1.015 (ref 1.005–1.030)
pH: 5.5 (ref 5.0–8.0)

## 2021-06-08 LAB — TROPONIN I (HIGH SENSITIVITY)
Troponin I (High Sensitivity): 13 ng/L (ref <18)
Troponin I (High Sensitivity): 11 ng/L (ref <18)

## 2021-06-08 LAB — LIPASE, BLOOD: Lipase: 42 U/L (ref 11–51)

## 2021-06-08 MED ORDER — FAMOTIDINE 20 MG PO TABS
20.0000 mg | ORAL_TABLET | Freq: Two times a day (BID) | ORAL | 0 refills | Status: DC
  Filled 2021-06-08: qty 30, 15d supply, fill #0

## 2021-06-08 MED ORDER — FAMOTIDINE IN NACL 20-0.9 MG/50ML-% IV SOLN
20.0000 mg | Freq: Once | INTRAVENOUS | Status: AC
  Administered 2021-06-08: 20 mg via INTRAVENOUS
  Filled 2021-06-08: qty 50

## 2021-06-08 MED ORDER — SUCRALFATE 1 GM/10ML PO SUSP
1.0000 g | Freq: Once | ORAL | Status: AC
  Administered 2021-06-08: 1 g via ORAL
  Filled 2021-06-08: qty 10

## 2021-06-08 MED ORDER — SODIUM CHLORIDE 0.9 % IV BOLUS
500.0000 mL | Freq: Once | INTRAVENOUS | Status: AC
  Administered 2021-06-08: 500 mL via INTRAVENOUS

## 2021-06-08 MED ORDER — SUCRALFATE 1 G PO TABS
1.0000 g | ORAL_TABLET | Freq: Three times a day (TID) | ORAL | 0 refills | Status: DC
  Filled 2021-06-08: qty 60, 15d supply, fill #0

## 2021-06-08 MED ORDER — KETOROLAC TROMETHAMINE 15 MG/ML IJ SOLN
15.0000 mg | Freq: Once | INTRAMUSCULAR | Status: AC
  Administered 2021-06-08: 15 mg via INTRAVENOUS
  Filled 2021-06-08: qty 1

## 2021-06-08 MED ORDER — POTASSIUM CHLORIDE CRYS ER 20 MEQ PO TBCR
40.0000 meq | EXTENDED_RELEASE_TABLET | Freq: Once | ORAL | Status: DC
Start: 2021-06-08 — End: 2021-06-08

## 2021-06-08 MED ORDER — ONDANSETRON HCL 4 MG/2ML IJ SOLN
4.0000 mg | Freq: Once | INTRAMUSCULAR | Status: AC
  Administered 2021-06-08: 4 mg via INTRAVENOUS
  Filled 2021-06-08: qty 2

## 2021-06-08 MED ORDER — POTASSIUM CHLORIDE 20 MEQ PO PACK
40.0000 meq | PACK | Freq: Once | ORAL | Status: AC
  Administered 2021-06-08: 40 meq via ORAL
  Filled 2021-06-08: qty 2

## 2021-06-08 NOTE — ED Notes (Signed)
Patient verbalizes understanding of discharge instructions. Opportunity for questioning and answers were provided. Patient discharged from ED.  °

## 2021-06-08 NOTE — ED Triage Notes (Signed)
Pt arrives to ED with c/o abdominal pain and flank pain. This started x2 days ago. The abd pain is located in the umbilical region and flank pain is right sided. The pain is described as sharp and constant. Associated symptoms include nausea, diarrhea, headache. She denies alleviating factors. Hx IBS-C, gastritis. Pt denies dysuria, hematuria, urinary frequency.

## 2021-06-08 NOTE — ED Provider Notes (Signed)
[facility] Provider Note   CSN: [id number] Arrival date & time: 06/08/21  6222     History  Chief Complaint  Patient presents with   Abdominal Pain   Flank Pain    [name] is a 61 y.o. female.  Patient is a 61 yo female with pmh of IBS, hiatal hernia, and azotemia presenting for complaints of abdominal pain. Pt admits to generalized abdominal pain with radiation right flank and chest, nausea without vomiting, decreased appetite, and 3 episodes of diarrhea x 2 days. Admits to associated fatigue and light headedness. States this feels different than her normal IBS flares stating "This is different, something is wrong". Denies chest pain or sob. Denies dysuria, hematuria, or increased frequency.   The history is provided by the patient. No language interpreter was used.  Abdominal Pain Associated symptoms: diarrhea and nausea   Associated symptoms: no chest pain, no chills, no cough, no dysuria, no fever, no hematuria, no shortness of breath, no sore throat and no vomiting   Flank Pain Associated symptoms include abdominal pain. Pertinent negatives include no chest pain and no shortness of breath.      Home Medications Prior to Admission medications   Medication Sig Start Date End Date Taking? Authorizing Provider  famotidine (PEPCID) 20 MG tablet Take 1 tablet (20 mg total) by mouth 2 (two) times daily. 9/79/89  Yes Campbell Stall P, DO  sucralfate (CARAFATE) 1 g tablet Take 1 tablet (1 g total) by mouth 4 (four) times daily -  with meals and at bedtime for 15 days. 06/08/21 02/21/17 Yes Campbell Stall P, DO  acetaminophen (TYLENOL) 650 MG CR tablet Take 650 mg by mouth every 8 (eight) hours as needed for pain (headache).    [provider]  Azelastine-Fluticasone 137-50 MCG/ACT SUSP Place 137 mcg into the nose in the morning, at noon, in the evening, and at bedtime.    [provider]  benzonatate (TESSALON) 200 MG capsule Take 200 mg  by mouth 3 (three) times daily. 03/31/21   [provider]  carvedilol (COREG) 12.5 MG tablet Take 0.5 tablets (6.25 mg total) by mouth daily. 04/13/21   Georgette Shell, MD  dexlansoprazole (DEXILANT) 60 MG capsule TAKE 1 CAPSULE (60 MG TOTAL) BY MOUTH DAILY. DAILY IN THE MORNING 05/21/21   Mauri Pole, MD  dicyclomine (BENTYL) 10 MG capsule Take 1 capsule (10 mg total) by mouth every 8 (eight) hours as needed for spasms. 04/24/20   Mauri Pole, MD  EPINEPHrine 0.3 mg/0.3 mL IJ SOAJ injection Inject 0.3 mg into the muscle as needed for anaphylaxis. 12/19/19   [provider]  ergocalciferol (VITAMIN D2) 1.25 MG (50000 UT) capsule Take 50,000 Units by mouth once a week. Fridays 05/18/20   [provider]  FARXIGA 10 MG TABS tablet Take 10 mg by mouth daily. 10/18/20   [provider]  fluticasone (FLONASE) 50 MCG/ACT nasal spray Place 1 spray into the nose in the morning, at noon, and at bedtime. 03/03/20   [provider]  guaiFENesin-codeine (ROBITUSSIN AC) 100-10 MG/5ML syrup Take 5 mLs by mouth 3 (three) times daily as needed for cough. 03/06/21   Quintella Reichert, MD  ipratropium (ATROVENT) 0.06 % nasal spray Place 2 sprays into both nostrils 3 (three) times daily.    [provider]  ipratropium-albuterol (DUONEB) 0.5-2.5 (3) MG/3ML SOLN Take 3 mLs by nebulization in the morning, at noon, in the evening, and at bedtime.  [provider]  isosorbide dinitrate (ISORDIL) 20 MG tablet Take 1 tablet (20 mg total) by mouth 2 (two) times daily. 04/13/21   Georgette Shell, MD  levocetirizine (XYZAL) 5 MG tablet Take 5 mg by mouth every evening.    [provider]  LINZESS 290 MCG CAPS capsule TAKE 1 CAPSULE BY MOUTH EVERY DAY BEFORE BREAKFAST 04/20/21   Zehr, Laban Emperor, PA-C  meclizine (ANTIVERT) 25 MG tablet Take 25 mg by mouth 2 (two) times daily as needed for dizziness.    [provider]  meloxicam  (MOBIC) 15 MG tablet Take 15 mg by mouth at bedtime. 11/20/20   [provider]  metolazone (ZAROXOLYN) 2.5 MG tablet Take 1 tablet (2.5 mg total) by mouth daily as needed (FOR OVERNIGHT WEIGHT GAIN OF 3 lbs. or more). 02/28/19   Skeet Latch, MD  montelukast (SINGULAIR) 10 MG tablet TAKE 1 TABLET(10 MG) BY MOUTH AT BEDTIME 07/25/17   Tanda Rockers, MD  pantoprazole (PROTONIX) 40 MG tablet Take 40 mg by mouth daily. 03/31/21   [provider]  Potassium Chloride ER 20 MEQ TBCR TAKE 3 TABLETS BY MOUTH 3 TIMES A DAY 01/02/21   Skeet Latch, MD  Respiratory Therapy Supplies (FLUTTER) DEVI 1 application by Does not apply route as directed. 12/27/16   Tanda Rockers, MD  rosuvastatin (CRESTOR) 20 MG tablet TAKE 1 TABLET BY MOUTH EVERY DAY 01/23/21   Skeet Latch, MD  spironolactone (ALDACTONE) 25 MG tablet TAKE 1/2 TABLETS BY MOUTH 2 TIMES DAILY. 12/25/20   Skeet Latch, MD  topiramate (TOPAMAX) 25 MG tablet Take 25 mg by mouth 2 (two) times daily as needed (for migraines).     [provider]  torsemide (DEMADEX) 20 MG tablet TAKE 2 TABLETS BY MOUTH 2 TIMES DAILY. 07/25/20   Skeet Latch, MD  traMADol (ULTRAM) 50 MG tablet Take 1 tablet (50 mg total) by mouth 3 (three) times daily as needed for moderate pain. 01/19/20   Deneise Lever, MD  TRELEGY ELLIPTA 200-62.5-25 MCG/INH AEPB Inhale 1 puff into the lungs daily. 05/23/20   [provider]  vitamin C (ASCORBIC ACID) 250 MG tablet Take 250 mg by mouth daily.    [provider]      Allergies    Sulfonamide derivatives, Doxycycline, Latex, Ciprofloxacin, Fish allergy, Hydrocortisone, and Neomycin    Review of Systems   Review of Systems  Constitutional:  Negative for chills and fever.  HENT:  Negative for ear pain and sore throat.   Eyes:  Negative for pain and visual disturbance.  Respiratory:  Negative for cough and shortness of breath.   Cardiovascular:  Negative for chest pain and  palpitations.  Gastrointestinal:  Positive for abdominal pain, diarrhea and nausea. Negative for vomiting.  Genitourinary:  Positive for flank pain. Negative for dysuria and hematuria.  Musculoskeletal:  Negative for arthralgias and back pain.  Skin:  Negative for color change and rash.  Neurological:  Negative for seizures and syncope.  All other systems reviewed and are negative.  Physical Exam Updated Vital Signs BP 131/80   Pulse 76   Temp 98.5 F (36.9 C) (Oral)   Resp 15   Ht '5\' 3"'$  (1.6 m)   Wt 129.3 kg   SpO2 98%   BMI 50.49 kg/m  Physical Exam Vitals and nursing note reviewed.  Constitutional:      General: She is not in acute distress.    Appearance: She is well-developed.  HENT:  Head: Normocephalic and atraumatic.  Eyes:     Conjunctiva/sclera: Conjunctivae normal.  Cardiovascular:     Rate and Rhythm: Normal rate and regular rhythm.     Heart sounds: No murmur heard. Pulmonary:     Effort: Pulmonary effort is normal. No respiratory distress.     Breath sounds: Normal breath sounds.  Abdominal:     Palpations: Abdomen is soft.     Tenderness: There is no abdominal tenderness.  Musculoskeletal:        General: No swelling.     Cervical back: Neck supple.  Skin:    General: Skin is warm and dry.     Capillary Refill: Capillary refill takes less than 2 seconds.  Neurological:     Mental Status: She is alert.  Psychiatric:        Mood and Affect: Mood normal.    ED Results / Procedures / Treatments   Labs (all labs ordered are listed, but only abnormal results are displayed) Labs Reviewed  COMPREHENSIVE METABOLIC PANEL - Abnormal; Notable for the following components:      Result Value   Sodium 134 (*)    Potassium 2.9 (*)    Chloride 86 (*)    Glucose, Bld 142 (*)    BUN 35 (*)    Creatinine, Ser 1.85 (*)    Total Protein 9.1 (*)    GFR, Estimated 30 (*)    Anion gap 17 (*)    All other components within normal limits  CBC - Abnormal;  Notable for the following components:   WBC 11.7 (*)    All other components within normal limits  URINALYSIS, ROUTINE W REFLEX MICROSCOPIC - Abnormal; Notable for the following components:   Color, Urine STRAW (*)    All other components within normal limits  LIPASE, BLOOD  TROPONIN I (HIGH SENSITIVITY)  TROPONIN I (HIGH SENSITIVITY)    EKG EKG Interpretation  Date/Time:  Friday Jun 08 2021 09:54:53 EDT Ventricular Rate:  70 PR Interval:  192 QRS Duration: 98 QT Interval:  449 QTC Calculation: 485 R Axis:   88 Text Interpretation: Sinus rhythm Borderline right axis deviation Low voltage, precordial leads Anteroseptal infarct, age indeterminate Confirmed by Addison Lank [phone]) on 06/09/2021 4:27:06 PM  Radiology No results found.  Procedures Procedures    Medications Ordered in ED Medications  ondansetron (ZOFRAN) injection 4 mg (4 mg Intravenous Given 06/08/21 0841)  sodium chloride 0.9 % bolus 500 mL (0 mLs Intravenous Stopped 06/08/21 1149)  famotidine (PEPCID) IVPB 20 mg premix (0 mg Intravenous Stopped 06/08/21 0922)  sucralfate (CARAFATE) 1 GM/10ML suspension 1 g (1 g Oral Given 06/08/21 0845)  potassium chloride (KLOR-CON) packet 40 mEq (40 mEq Oral Given 06/08/21 1028)  ketorolac (TORADOL) 15 MG/ML injection 15 mg (15 mg Intravenous Given 06/08/21 1148)    ED Course/ Medical Decision Making/ A&P                           Medical Decision Making Amount and/or Complexity of Data Reviewed Labs: ordered. Radiology: ordered. ECG/medicine tests: ordered.  Risk Prescription drug management.    60 yo female with pmh of IBS, hiatal hernia, and azotemia presenting for complaints of abdominal pain. Pt is Aox3, no acute distress, afebrile, with stable vitals. Physical exam demonstrates soft abdomen with generalized tenderness. Stable liver profile, lipase, and renal function. Hypokalemia at 2.9. Oral potassium given. UA demonstrates no UTI. CT abdomen demonstrates no  acute process. Patient's  symptoms possibly secondary to IBS. GI cocktail and IVF given with improvement of symptoms.   Patient in no distress and overall condition improved here in the ED. Detailed discussions were had with the patient regarding current findings, and need for close f/u with GI. The patient has been instructed to return immediately if the symptoms worsen in any way for re-evaluation. Patient verbalized understanding and is in agreement with current care plan. All questions answered prior to discharge.         Final Clinical Impression(s) / ED Diagnoses Final diagnoses:  Irritable bowel syndrome, unspecified type  Diarrhea, unspecified type  Nausea    Rx / DC Orders ED Discharge Orders          Ordered    famotidine (PEPCID) 20 MG tablet  2 times daily        06/08/21 1244    sucralfate (CARAFATE) 1 g tablet  3 times daily with meals & bedtime        06/08/21 1244              Lianne Cure, DO 09/98/33 1522

## 2021-06-11 ENCOUNTER — Telehealth: Payer: Self-pay | Admitting: Gastroenterology

## 2021-06-11 NOTE — Telephone Encounter (Signed)
[name] says she went to the ED for abdominal pain. Abdominal pain is upper and lower. Complains of excessive upper and lower gas.She feels full, nauseated and no appetite.  She was given Sucralfate and Pepcid. She is still taking Miralax two doses a day.  Reports good bowel movements. She is concerned about the Sucralfate affecting her kidneys. She is concerned because she is having an IBS flare.  Please advise.

## 2021-06-11 NOTE — Telephone Encounter (Signed)
Patient called stating she went to the ER this past Friday and needs to talk to you about seeing Dr. Silverio Decamp.  Please call patient and advise.  Thank you.

## 2021-06-11 NOTE — Telephone Encounter (Signed)
Patient advised of the recommendations.  

## 2021-06-11 NOTE — Telephone Encounter (Signed)
Please advise patient to use sucralfate only twice a day instead of 4 times a day and we worry about potential side effects in patients with end-stage renal disease.  It is fine to use it for 1 to 2 weeks in her case.  Thank you

## 2021-06-12 ENCOUNTER — Ambulatory Visit (HOSPITAL_BASED_OUTPATIENT_CLINIC_OR_DEPARTMENT_OTHER): Payer: 59 | Admitting: Cardiovascular Disease

## 2021-06-12 ENCOUNTER — Other Ambulatory Visit: Payer: Self-pay

## 2021-06-12 MED ORDER — LINACLOTIDE 290 MCG PO CAPS: ORAL_CAPSULE | ORAL | 6 refills | Status: DC

## 2021-06-14 ENCOUNTER — Ambulatory Visit: Payer: 59 | Admitting: Cardiology

## 2021-06-14 ENCOUNTER — Encounter: Payer: Self-pay | Admitting: Cardiology

## 2021-06-14 VITALS — BP 105/63 | HR 69 | Resp 16 | Ht 63.0 in | Wt 287.8 lb

## 2021-06-14 DIAGNOSIS — I10 Essential (primary) hypertension: Secondary | ICD-10-CM

## 2021-06-14 DIAGNOSIS — N1832 Chronic kidney disease, stage 3b: Secondary | ICD-10-CM

## 2021-06-14 DIAGNOSIS — I5032 Chronic diastolic (congestive) heart failure: Secondary | ICD-10-CM

## 2021-06-14 DIAGNOSIS — E7800 Pure hypercholesterolemia, unspecified: Secondary | ICD-10-CM

## 2021-06-14 DIAGNOSIS — E559 Vitamin D deficiency, unspecified: Secondary | ICD-10-CM

## 2021-06-14 NOTE — Progress Notes (Addendum)
Primary Physician/Referring:  Bartholome Bill, MD  Patient ID: [name], female    DOB: Apr 12, 1960, 61 y.o.   MRN: [id number]  Chief Complaint  Patient presents with   Congestive Heart Failure   Hypertension   HPI:    [name]  is a 61 y.o. African-American female patient with chronic diastolic heart failure, normal coronary arteries by remote catheterization and also by coronary CTA in September 2022, primary hypertension, hyperlipidemia, morbid obesity, hyperglycemia, chronic stage IIIa kidney disease, OSA on BiPAP, previously evaluated by multiple cardiologists at [facility], follows Dr. Silverio Decamp for IBS and GERD, Dr. Madelon Lips for management of renal disease presents to establish care with me.  Extensive review of her charts, she has been unhappy with management of heart failure, she wanted somebody to work with her and coordinate her care and hence wanted to establish with me.  States that finally she is on appropriate medical therapy and has started to feel better.  Recently was treated for CHF exacerbation by taking Zaroxolyn for 3 days, then developed abdominal discomfort and presented to the emergency room on 06/08/2021 where she was severely hypokalemic.  She also had acute on chronic renal failure.  As she is feeling well today, denies leg edema, dyspnea has remained stable, no PND or orthopnea.  Past Medical History:  Diagnosis Date   Acute on chronic diastolic CHF (congestive heart failure) ([place]) 04/11/2014   Acute suppurative otitis media without spontaneous rupture of eardrum 02/04/2007   Centricity Description: OTITIS MEDIA, SUPPURATIVE, ACUTE, BILATERAL Qualifier: Diagnosis of  By: [name] MD, [name]  Centricity Description: OTITIS MEDIA, SUPPURATIVE, ACUTE Qualifier: Diagnosis of  By: [name] MD, Sean A    Allergic rhinitis    Allergy    Anemia    Anxiety    Arthritis    Atherosclerosis of aorta ([place]) 02/21/2021   CAD (coronary artery disease)    LHC  (11/29/1999):  EF 60%; no significant CAD.   Carpal tunnel syndrome    Carpal tunnel syndrome 06/09/2006   Qualifier: Diagnosis of  By: Marca Ancona RMA, Lucy     Chronic diastolic CHF (congestive heart failure) ([place])    a. Echo (01/21/11): Vigorous LVF, EF 65-70%, no RWMA, Gr 2 DD. b.  Echo (12/15): Moderate LVH, EF 68-08%, grade 2 diastolic dysfunction, mild LAE, normal RV function c. 01/2015: echo showing EF of 60-65% with mild LVH   Common migraine    COPD (chronic obstructive pulmonary disease) (HCC)    [id number]    x 2   Diastolic heart failure (HCC)    Diverticulitis    Dyspnea    Hepatic steatosis    Hepatomegaly    Hiatal hernia    Hyperlipidemia    IBS (irritable bowel syndrome)    Internal hemorrhoids    Long COVID    Metabolic syndrome    Morbid obesity ([place])    PNEUMONIA, ORGAN UNSPECIFIED 03/01/2009   Qualifier: Diagnosis of  By: [name] CMA, Jennifer     Sinusitis, chronic 03/10/2012   CT sinuses 02/2012:  Chronic sinusitis with small A/F levels >> rx by ENT with prolonged augmentin F/u ct sinuses 04/2012:  Persistent sinus thickening >> rx with levaquin and clinda for 30 days.  Told to return for sinus scan in 6 weeks >> no showed for followup visit.  CT sinuses 11/2013:  Acute and chronic sinusitis noted.     Past Surgical History:  Procedure Laterality Date   ABDOMINAL HYSTERECTOMY     BIOPSY  06/22/2018   Procedure: BIOPSY;  Surgeon: Mauri Pole, MD;  Location: [facility];  Service: Endoscopy;;   BIOPSY  07/28/2018   Procedure: BIOPSY;  Surgeon: Mauri Pole, MD;  Location: [facility];  Service: Endoscopy;;   CESAREAN SECTION     COLONOSCOPY     COLONOSCOPY WITH PROPOFOL N/A 05/05/2017   Procedure: COLONOSCOPY WITH PROPOFOL;  Surgeon: Mauri Pole, MD;  Location: [facility];  Service: Endoscopy;  Laterality: N/A;   ESOPHAGOGASTRODUODENOSCOPY (EGD) WITH PROPOFOL N/A 06/22/2018   Procedure: ESOPHAGOGASTRODUODENOSCOPY (EGD) WITH PROPOFOL;  Surgeon:  Mauri Pole, MD;  Location: [facility];  Service: Endoscopy;  Laterality: N/A;   ESOPHAGOGASTRODUODENOSCOPY (EGD) WITH PROPOFOL N/A 07/28/2018   Procedure: ESOPHAGOGASTRODUODENOSCOPY (EGD) WITH PROPOFOL;  Surgeon: Mauri Pole, MD;  Location: [facility];  Service: Endoscopy;  Laterality: N/A;   NASAL SINUS SURGERY     PARTIAL HYSTERECTOMY     SINUS ENDO WITH FUSION Left 05/28/2019   Procedure: SINUS ENDO WITH FUSION;  Surgeon: [name], MD;  Location: [place];  Service: ENT;  Laterality: Left;   TONSILLECTOMY     TUBAL LIGATION     TURBINATE REDUCTION Bilateral 05/28/2019   Procedure: TURBINATE REDUCTION;  Surgeon: [name], MD;  Location: [place];  Service: ENT;  Laterality: Bilateral;   UVULOPALATOPHARYNGOPLASTY     Family History  Problem Relation Age of Onset   Heart attack Mother 56       1st one @ 55, 59nd one 50   Heart disease Mother    Hypertension Mother    Asthma Sister    Colon polyps Sister    Diabetes Sister    Diabetes Sister    Diabetes Brother    Diabetes Brother    Diabetes Brother    Diabetes Brother    Diabetes Half-Sister    Stroke Neg Hx    Colon cancer Neg Hx    Esophageal cancer Neg Hx    Rectal cancer Neg Hx    Stomach cancer Neg Hx     Social History   Tobacco Use   Smoking status: Never   Smokeless tobacco: Never  Substance Use Topics   Alcohol use: No   Marital Status: Widowed  ROS  Review of Systems  Constitutional: Positive for malaise/fatigue.  Cardiovascular:  Positive for dyspnea on exertion. Negative for chest pain and leg swelling.  Objective  Blood pressure 105/63, pulse 69, resp. rate 16, height '5\' 3"'$  (1.6 m), weight 287 lb 12.8 oz (130.5 kg), SpO2 94 %. Body mass index is 50.98 kg/m.     06/14/2021   11:29 AM 06/08/2021    1:26 PM 06/08/2021   11:30 AM  Vitals with BMI  Height '5\' 3"'$     Weight 287 lbs 13 oz    BMI 06.30    Systolic 160 109 323  Diastolic 63 80 67  Pulse 69 76 73    Physical  Exam Constitutional:      Comments: Morbidly obese in no acute distress.  Neck:     Vascular: No carotid bruit or JVD.  Cardiovascular:     Rate and Rhythm: Normal rate and regular rhythm.     Pulses: Intact distal pulses.     Heart sounds: Normal heart sounds. No murmur heard.   No gallop.  Pulmonary:     Effort: Pulmonary effort is normal.     Breath sounds: Normal breath sounds.  Abdominal:     General: Bowel sounds are normal.  Palpations: Abdomen is soft.     Comments: Obese. Pannus present  Musculoskeletal:     Right lower leg: No edema.     Left lower leg: No edema.    Medications and allergies   Allergies  Allergen Reactions   Sulfonamide Derivatives Anaphylaxis, Swelling, Rash and Other (See Comments)    Swelling tongue and ear    Doxycycline Nausea And Vomiting   Latex Hives   Ciprofloxacin Rash   Fish Allergy Rash and Other (See Comments)    Only reaction to Mackerel.  No other issues with any type of fish or shellfish currently    Hydrocortisone Rash   Neomycin Rash     Medication list after today's encounter   Current Outpatient Medications:    acetaminophen (TYLENOL) 650 MG CR tablet, Take 650 mg by mouth every 8 (eight) hours as needed for pain (headache)., Disp: , Rfl:    Azelastine-Fluticasone 137-50 MCG/ACT SUSP, Place 137 mcg into the nose in the morning, at noon, in the evening, and at bedtime., Disp: , Rfl:    benzonatate (TESSALON) 200 MG capsule, Take 200 mg by mouth 3 (three) times daily., Disp: , Rfl:    carvedilol (COREG) 12.5 MG tablet, Take 0.5 tablets (6.25 mg total) by mouth daily., Disp: 180 tablet, Rfl: 2   dexlansoprazole (DEXILANT) 60 MG capsule, TAKE 1 CAPSULE (60 MG TOTAL) BY MOUTH DAILY. DAILY IN THE MORNING, Disp: 90 capsule, Rfl: 1   dicyclomine (BENTYL) 10 MG capsule, Take 1 capsule (10 mg total) by mouth every 8 (eight) hours as needed for spasms., Disp: 60 capsule, Rfl: 1   EPINEPHrine 0.3 mg/0.3 mL IJ SOAJ injection, Inject  0.3 mg into the muscle as needed for anaphylaxis., Disp: , Rfl:    ergocalciferol (VITAMIN D2) 1.25 MG (50000 UT) capsule, Take 50,000 Units by mouth once a week. Fridays, Disp: , Rfl:    famotidine (PEPCID) 20 MG tablet, Take 1 tablet (20 mg total) by mouth 2 (two) times daily., Disp: 30 tablet, Rfl: 0   FARXIGA 10 MG TABS tablet, Take 10 mg by mouth daily., Disp: , Rfl:    fluticasone (FLONASE) 50 MCG/ACT nasal spray, Place 1 spray into the nose in the morning, at noon, and at bedtime., Disp: , Rfl:    guaiFENesin-codeine (ROBITUSSIN AC) 100-10 MG/5ML syrup, Take 5 mLs by mouth 3 (three) times daily as needed for cough., Disp: 120 mL, Rfl: 0   ipratropium (ATROVENT) 0.06 % nasal spray, Place 2 sprays into both nostrils 3 (three) times daily., Disp: , Rfl:    ipratropium-albuterol (DUONEB) 0.5-2.5 (3) MG/3ML SOLN, Take 3 mLs by nebulization in the morning, at noon, in the evening, and at bedtime., Disp: , Rfl:    isosorbide dinitrate (ISORDIL) 20 MG tablet, Take 1 tablet (20 mg total) by mouth 2 (two) times daily., Disp: 270 tablet, Rfl: 3   levocetirizine (XYZAL) 5 MG tablet, Take 5 mg by mouth every evening., Disp: , Rfl:    linaclotide (LINZESS) 290 MCG CAPS capsule, TAKE 1 CAPSULE BY MOUTH EVERY DAY BEFORE BREAKFAST, Disp: 30 capsule, Rfl: 6   meclizine (ANTIVERT) 25 MG tablet, Take 25 mg by mouth 2 (two) times daily as needed for dizziness., Disp: , Rfl:    meloxicam (MOBIC) 15 MG tablet, Take 15 mg by mouth at bedtime., Disp: , Rfl:    metolazone (ZAROXOLYN) 2.5 MG tablet, Take 1 tablet (2.5 mg total) by mouth daily as needed (FOR OVERNIGHT WEIGHT GAIN OF 3 lbs. or more).,  Disp: 90 tablet, Rfl: 3   montelukast (SINGULAIR) 10 MG tablet, TAKE 1 TABLET(10 MG) BY MOUTH AT BEDTIME, Disp: 30 tablet, Rfl: 1   pantoprazole (PROTONIX) 40 MG tablet, Take 40 mg by mouth daily., Disp: , Rfl:    Potassium Chloride ER 20 MEQ TBCR, TAKE 3 TABLETS BY MOUTH 3 TIMES A DAY, Disp: 540 tablet, Rfl: 2    Respiratory Therapy Supplies (FLUTTER) DEVI, 1 application by Does not apply route as directed., Disp: 1 each, Rfl: 0   rosuvastatin (CRESTOR) 20 MG tablet, TAKE 1 TABLET BY MOUTH EVERY DAY, Disp: 90 tablet, Rfl: 2   spironolactone (ALDACTONE) 25 MG tablet, TAKE 1/2 TABLETS BY MOUTH 2 TIMES DAILY., Disp: 90 tablet, Rfl: 1   sucralfate (CARAFATE) 1 g tablet, Take 1 tablet (1 g total) by mouth 4 (four) times daily -  with meals and at bedtime for 15 days. (Patient taking differently: Take 1 g by mouth 2 (two) times daily.), Disp: 60 tablet, Rfl: 0   topiramate (TOPAMAX) 25 MG tablet, Take 25 mg by mouth 2 (two) times daily as needed (for migraines). , Disp: , Rfl:    torsemide (DEMADEX) 20 MG tablet, TAKE 2 TABLETS BY MOUTH 2 TIMES DAILY., Disp: 360 tablet, Rfl: 0   traMADol (ULTRAM) 50 MG tablet, Take 1 tablet (50 mg total) by mouth 3 (three) times daily as needed for moderate pain., Disp: 90 tablet, Rfl: 1   TRELEGY ELLIPTA 200-62.5-25 MCG/INH AEPB, Inhale 1 puff into the lungs daily., Disp: , Rfl:    vitamin C (ASCORBIC ACID) 250 MG tablet, Take 250 mg by mouth daily., Disp: , Rfl:   Laboratory examination:   Recent Labs    04/13/21 0409 04/16/21 1258 06/08/21 0820 06/14/21 1250  NA 138 137 134* 140  K 4.1 3.3* 2.9* 4.6  CL 110 95* 86* 103  CO2 '24 30 31 22  '$ GLUCOSE 103* 120* 142* 129*  BUN 19 25* 35* 23  CREATININE 0.80 1.15* 1.85* 1.27*  CALCIUM 8.8* 10.3 10.1 9.8  GFRNONAA >60 54* 30*  --    estimated creatinine clearance is 60.6 mL/min (A) (by C-G formula based on SCr of 1.27 mg/dL (H)).      Latest Ref Rng & Units 06/14/2021   12:50 PM 06/08/2021    8:20 AM 04/16/2021   12:58 PM  CMP  Glucose 70 - 99 mg/dL 129   142   120    BUN 8 - 27 mg/dL 23   35   25    Creatinine 0.57 - 1.00 mg/dL 1.27   1.85   1.15    Sodium 134 - 144 mmol/L 140   134   137    Potassium 3.5 - 5.2 mmol/L 4.6   2.9   3.3    Chloride 96 - 106 mmol/L 103   86   95    CO2 20 - 29 mmol/L '22   31   30     '$ Calcium 8.7 - 10.3 mg/dL 9.8   10.1   10.3    Total Protein 6.5 - 8.1 g/dL  9.1     Total Bilirubin 0.3 - 1.2 mg/dL  1.0     Alkaline Phos 38 - 126 U/L  100     AST 15 - 41 U/L  26     ALT 0 - 44 U/L  20         Latest Ref Rng & Units 06/08/2021    8:20 AM 04/16/2021  12:58 PM 04/13/2021    4:09 AM  CBC  WBC 4.0 - 10.5 K/uL 11.7   11.5   11.2    Hemoglobin 12.0 - 15.0 g/dL 14.4   12.4   10.3    Hematocrit 36.0 - 46.0 % 43.4   37.7   33.3    Platelets 150 - 400 K/uL 309   268   206      Lipid Panel No results for input(s): CHOL, TRIG, LDLCALC, VLDL, HDL, CHOLHDL, LDLDIRECT in the last 8760 hours.  HEMOGLOBIN A1C Lab Results  Component Value Date   HGBA1C 5.8 (H) 02/19/2015   MPG 120 02/19/2015   TSH No results for input(s): TSH in the last 8760 hours.  BNP (last 3 results) Recent Labs    10/22/20 1300 03/05/21 2031  BNP 27.1 25.7    ProBNP (last 3 results) Recent Labs    06/14/21 1250  PROBNP 284   Radiology:    Cardiac Studies:   Extended outpatient telemetry 11/06/2016: Predominant rhythm is normal sinus rhythm.  Average heart rate is 87 bpm. Patient symptoms of palpitations correlated with sinus tachycardia at 120 bpm.  Occasional PACs.  Lexiscan nuclear stress test 04/01/2017: LVEF 77%, no wall motion abnormality.  Increased wall thickness. No evidence of ischemia.  Low risk study.  Echocardiogram 03/23/2019:  1. Left ventricular ejection fraction, by estimation, is >75%. The left ventricle has hyperdynamic function. The left ventricle has no regional wall motion abnormalities. There is moderate left ventricular hypertrophy. Left ventricular diastolic  parameters are indeterminate.  2. Right ventricular systolic function was not well visualized. The right ventricular size is not well visualized.  3. The mitral valve was not well visualized. No evidence of mitral valve regurgitation.  4. The aortic valve was not well visualized. Aortic valve regurgitation  is not visualized.  5. The inferior vena cava IVC measures <1.2 cm and spontaenously collapses, suggesting volume depletion and a low RA pressure <3 mmHg.  Coronary CTA 11/16/2020: Coronary calcium score of 0. Normal coronary origin with right dominance. Normal coronary arteries.  EKG:   EKG 06/14/2021: Normal sinus rhythm at rate of 63 bpm, normal axis, poor R wave progression, cannot exclude anteroseptal infarct old.  Low-voltage complexes.    Assessment     ICD-10-CM   1. Chronic diastolic heart failure (HCC)  I50.32 EKG [id number]    Basic metabolic panel    Pro b natriuretic peptide (BNP)    Pro b natriuretic peptide (BNP)    Basic metabolic panel    TSH    2. Essential hypertension  I10 TSH    3. Pure hypercholesterolemia  E78.00     4. Class 3 severe obesity due to excess calories without serious comorbidity with body mass index (BMI) of 50.0 to 59.9 in adult (HCC)  E66.01    Z68.43     5. Stage 3b chronic kidney disease (HCC)  L27.51 Basic metabolic panel    6. Hypovitaminosis D  E55.9 VITAMIN D 25 Hydroxy (Vit-D Deficiency, Fractures)      There are no discontinued medications.  No orders of the defined types were placed in this encounter.  Orders Placed This Encounter  Procedures   Basic metabolic panel   Pro b natriuretic peptide (BNP)   Pro b natriuretic peptide (BNP)    Standing Status:   Future    Standing Expiration Date:   7/00/1749   Basic metabolic panel    Standing Status:   Future    Standing  Expiration Date:   06/15/2022   TSH   VITAMIN D 25 Hydroxy (Vit-D Deficiency, Fractures)   EKG 12-Lead   Recommendations:   [name] is a 61 y.o.  African-American female patient with chronic diastolic heart failure, normal coronary arteries by remote catheterization and also by coronary CTA in September 2022, primary hypertension, hyperlipidemia, morbid obesity, hyperglycemia, chronic stage IIIa kidney disease, OSA on BiPAP, previously evaluated by  multiple cardiologists at [facility], follows Dr. Silverio Decamp for IBS and GERD, Dr. Madelon Lips for management of renal disease presents to establish care with me.  Extensive review of her charts, she has been unhappy with management of heart failure, she wanted somebody to work with her and coordinate her care and hence wanted to establish with me.  States that finally she is on appropriate medical therapy and has started to feel better.  Recently was treated for CHF exacerbation by taking Zaroxolyn for 3 days, then developed abdominal discomfort and presented to the emergency room on 06/08/2021 where she was severely hypokalemic.  She also had acute on chronic renal failure.  As she is feeling well, today there is no clinical evidence of heart failure, I did not make any changes to her medication but would like to follow-up on the renal function.  She will obtain proBNP and also BMP today and again repeat the same test in 2 weeks.  There is no TSH in a while, vitamin D needs to be evaluated as well as she is on high-dose supplements to exclude toxicity.  Patient is accompanied by her goddaughter, on questioning, she eats in fast food restaurants 50% of the times" other 50% of the times she cooks at home but again poor diet that includes frozen dinners, pizzas, fried foods as well.  I had a long discussion with the patient that if she were to go on mostly vegetarian diet with little bit of meat and to give up on eating outside even for just 4 to 6 weeks she would notice significant improvement in overall health.  I have discussed with her that precipitation of heart failure is related to her poor diet.  Renal failure is iatrogenic but unavoidable in view of acute decompensated heart failure.  This was clearly explained to the patient.  I would like to see her back in 4 weeks for follow-up.  She is presently on a statin, she needs lipid profile testing on a fasting state and will try to get this done at some point  in the future.  This was a 90-minute office visit encounter.  Addendum: Reviewed her labs, renal function has improved significantly.  proBNP is within normal limits however this is nonspecific in a morbidly obese and African-American female, clinically still suspect she has chronic diastolic heart failure.   Adrian Prows, MD, [facility] 06/15/2021, 6:36 AM Office: [phone]            CC: Drs. Precious Haws and Madelon Lips

## 2021-06-14 NOTE — Patient Instructions (Signed)
Please have

## 2021-06-15 LAB — BASIC METABOLIC PANEL
BUN/Creatinine Ratio: 18 (ref 12–28)
BUN: 23 mg/dL (ref 8–27)
CO2: 22 mmol/L (ref 20–29)
Calcium: 9.8 mg/dL (ref 8.7–10.3)
Chloride: 103 mmol/L (ref 96–106)
Creatinine, Ser: 1.27 mg/dL — ABNORMAL HIGH (ref 0.57–1.00)
Glucose: 129 mg/dL — ABNORMAL HIGH (ref 70–99)
Potassium: 4.6 mmol/L (ref 3.5–5.2)
Sodium: 140 mmol/L (ref 134–144)
eGFR: 48 mL/min/{1.73_m2} — ABNORMAL LOW (ref >59)

## 2021-06-15 LAB — TSH: TSH: 2.01 u[IU]/mL (ref 0.450–4.500)

## 2021-06-15 LAB — VITAMIN D 25 HYDROXY (VIT D DEFICIENCY, FRACTURES): Vit D, 25-Hydroxy: 34 ng/mL (ref 30.0–100.0)

## 2021-06-15 LAB — PRO B NATRIURETIC PEPTIDE: NT-Pro BNP: 284 pg/mL (ref 0–287)

## 2021-07-04 ENCOUNTER — Ambulatory Visit: Payer: 59 | Admitting: Gastroenterology

## 2021-07-05 ENCOUNTER — Other Ambulatory Visit: Payer: Self-pay | Admitting: Endocrinology

## 2021-07-05 DIAGNOSIS — E041 Nontoxic single thyroid nodule: Secondary | ICD-10-CM

## 2021-07-24 LAB — BASIC METABOLIC PANEL
BUN/Creatinine Ratio: 24 (ref 12–28)
BUN: 27 mg/dL (ref 8–27)
CO2: 24 mmol/L (ref 20–29)
Calcium: 9.7 mg/dL (ref 8.7–10.3)
Chloride: 104 mmol/L (ref 96–106)
Creatinine, Ser: 1.14 mg/dL — ABNORMAL HIGH (ref 0.57–1.00)
Glucose: 98 mg/dL (ref 70–99)
Potassium: 4.7 mmol/L (ref 3.5–5.2)
Sodium: 141 mmol/L (ref 134–144)
eGFR: 54 mL/min/{1.73_m2} — ABNORMAL LOW (ref >59)

## 2021-07-24 LAB — PRO B NATRIURETIC PEPTIDE: NT-Pro BNP: 239 pg/mL (ref 0–287)

## 2021-08-01 ENCOUNTER — Ambulatory Visit: Payer: 59 | Admitting: Cardiology

## 2021-08-01 ENCOUNTER — Encounter: Payer: Self-pay | Admitting: Cardiology

## 2021-08-01 VITALS — BP 107/75 | HR 90 | Temp 98.0°F | Resp 16 | Ht 63.0 in | Wt 271.0 lb

## 2021-08-01 DIAGNOSIS — E7800 Pure hypercholesterolemia, unspecified: Secondary | ICD-10-CM

## 2021-08-01 DIAGNOSIS — I5032 Chronic diastolic (congestive) heart failure: Secondary | ICD-10-CM

## 2021-08-01 DIAGNOSIS — I10 Essential (primary) hypertension: Secondary | ICD-10-CM

## 2021-08-01 MED ORDER — FARXIGA 10 MG PO TABS: 10.0000 mg | ORAL_TABLET | Freq: Every day | ORAL | 1 refills | Status: DC

## 2021-08-01 MED ORDER — ROSUVASTATIN CALCIUM 20 MG PO TABS: 20.0000 mg | ORAL_TABLET | Freq: Every day | ORAL | 3 refills | Status: DC

## 2021-08-01 MED ORDER — POTASSIUM CHLORIDE CRYS ER 20 MEQ PO TBCR: 20.0000 meq | EXTENDED_RELEASE_TABLET | ORAL | 3 refills | Status: DC

## 2021-08-01 MED ORDER — TORSEMIDE 20 MG PO TABS: 40.0000 mg | ORAL_TABLET | ORAL | 0 refills | Status: DC

## 2021-08-01 MED ORDER — ISOSORBIDE DINITRATE 20 MG PO TABS: 20.0000 mg | ORAL_TABLET | Freq: Two times a day (BID) | ORAL | 3 refills | Status: DC

## 2021-08-01 MED ORDER — SPIRONOLACTONE 25 MG PO TABS: 25.0000 mg | ORAL_TABLET | ORAL | 1 refills | Status: DC

## 2021-08-01 NOTE — Patient Instructions (Signed)
Isosorbide Dinitrate tablet can be stopped if you BP is well controlled and as long as it remiains < 130/80 mm Hg

## 2021-08-01 NOTE — Progress Notes (Signed)
Primary Physician/Referring:  Bartholome Bill, MD  Patient ID: [name], female    DOB: June 22, 1960, 61 y.o.   MRN: [id number]  Chief Complaint  Patient presents with   Chronic diastolic failure   Chronic Renal Failure   Follow-up    4 weeks   HPI:    DORE OQUIN  is a 61 y.o. African-American female patient with chronic diastolic heart failure, normal coronary arteries by remote catheterization and also by coronary CTA in September 2022, primary hypertension, hyperlipidemia, morbid obesity, hyperglycemia, chronic stage IIIa kidney disease, OSA on BiPAP, previously evaluated by multiple cardiologists at [facility], follows Dr. Silverio Decamp for IBS and GERD, Dr. Madelon Lips for management of renal disease presents to establish care with me.  I had seen her a month ago, I had extensive discussion regarding diet management and medication management, she has lost 17 pounds in weight.  Leg edema has completely resolved.  Blood pressure is also very well controlled.  She is feeling much improved with regard to her arthritis and also dyspnea.   Past Medical History:  Diagnosis Date   Acute on chronic diastolic CHF (congestive heart failure) ([place]) 04/11/2014   Acute suppurative otitis media without spontaneous rupture of eardrum 02/04/2007   Centricity Description: OTITIS MEDIA, SUPPURATIVE, ACUTE, BILATERAL Qualifier: Diagnosis of  By: [name] MD, [name]  Centricity Description: OTITIS MEDIA, SUPPURATIVE, ACUTE Qualifier: Diagnosis of  By: [name] MD, Sean A    Allergic rhinitis    Allergy    Anemia    Anxiety    Arthritis    Atherosclerosis of aorta ([place]) 02/21/2021   CAD (coronary artery disease)    LHC (11/29/1999):  EF 60%; no significant CAD.   Carpal tunnel syndrome    Carpal tunnel syndrome 06/09/2006   Qualifier: Diagnosis of  By: Marca Ancona RMA, Lucy     Chronic diastolic CHF (congestive heart failure) ([place])    a. Echo (01/21/11): Vigorous LVF, EF 65-70%, no RWMA, Gr 2 DD. b.   Echo (12/15): Moderate LVH, EF 58-52%, grade 2 diastolic dysfunction, mild LAE, normal RV function c. 01/2015: echo showing EF of 60-65% with mild LVH   Common migraine    COPD (chronic obstructive pulmonary disease) (HCC)    [id number]    x 2   Diastolic heart failure (HCC)    Diverticulitis    Dyspnea    Hepatic steatosis    Hepatomegaly    Hiatal hernia    Hyperlipidemia    IBS (irritable bowel syndrome)    Internal hemorrhoids    Long COVID    Metabolic syndrome    Morbid obesity ([place])    PNEUMONIA, ORGAN UNSPECIFIED 03/01/2009   Qualifier: Diagnosis of  By: [name] CMA, Jennifer     Sinusitis, chronic 03/10/2012   CT sinuses 02/2012:  Chronic sinusitis with small A/F levels >> rx by ENT with prolonged augmentin F/u ct sinuses 04/2012:  Persistent sinus thickening >> rx with levaquin and clinda for 30 days.  Told to return for sinus scan in 6 weeks >> no showed for followup visit.  CT sinuses 11/2013:  Acute and chronic sinusitis noted.     Social History   Tobacco Use   Smoking status: Never   Smokeless tobacco: Never  Substance Use Topics   Alcohol use: No   Marital Status: Widowed  ROS  Review of Systems  Constitutional: Positive for malaise/fatigue.  Cardiovascular:  Positive for dyspnea on exertion. Negative for chest pain and leg swelling.  Objective  Blood pressure 107/75, pulse 90, temperature 98 F (36.7 C), resp. rate 16, height '5\' 3"'$  (1.6 m), weight 271 lb (122.9 kg), SpO2 96 %. Body mass index is 48.01 kg/m.     08/01/2021    1:02 PM 06/14/2021   11:29 AM 06/08/2021    1:26 PM  Vitals with BMI  Height '5\' 3"'$  '5\' 3"'$    Weight 271 lbs 287 lbs 13 oz   BMI 27.78 24.23   Systolic 536 144 315  Diastolic 75 63 80  Pulse 90 69 76    Physical Exam Constitutional:      Comments: Morbidly obese in no acute distress.  Neck:     Vascular: No carotid bruit or JVD.  Cardiovascular:     Rate and Rhythm: Normal rate and regular rhythm.     Pulses: Intact distal  pulses.     Heart sounds: Normal heart sounds. No murmur heard.    No gallop.  Pulmonary:     Effort: Pulmonary effort is normal.     Breath sounds: Normal breath sounds.  Abdominal:     General: Bowel sounds are normal.     Palpations: Abdomen is soft.     Comments: Obese. Pannus present  Musculoskeletal:     Right lower leg: No edema.     Left lower leg: No edema.     Medications and allergies   Allergies  Allergen Reactions   Sulfonamide Derivatives Anaphylaxis, Swelling, Rash and Other (See Comments)    Swelling tongue and ear    Doxycycline Nausea And Vomiting   Latex Hives   Ciprofloxacin Rash   Fish Allergy Rash and Other (See Comments)    Only reaction to Mackerel.  No other issues with any type of fish or shellfish currently    Hydrocortisone Rash   Neomycin Rash     Medication list after today's encounter   Current Outpatient Medications:    acetaminophen (TYLENOL) 650 MG CR tablet, Take 650 mg by mouth every 8 (eight) hours as needed for pain (headache)., Disp: , Rfl:    Azelastine-Fluticasone 137-50 MCG/ACT SUSP, Place 137 mcg into the nose in the morning, at noon, in the evening, and at bedtime., Disp: , Rfl:    benzonatate (TESSALON) 200 MG capsule, Take 200 mg by mouth 3 (three) times daily., Disp: , Rfl:    carvedilol (COREG) 12.5 MG tablet, Take 0.5 tablets (6.25 mg total) by mouth daily., Disp: 180 tablet, Rfl: 2   dexlansoprazole (DEXILANT) 60 MG capsule, TAKE 1 CAPSULE (60 MG TOTAL) BY MOUTH DAILY. DAILY IN THE MORNING, Disp: 90 capsule, Rfl: 1   dicyclomine (BENTYL) 10 MG capsule, Take 1 capsule (10 mg total) by mouth every 8 (eight) hours as needed for spasms., Disp: 60 capsule, Rfl: 1   EPINEPHrine 0.3 mg/0.3 mL IJ SOAJ injection, Inject 0.3 mg into the muscle as needed for anaphylaxis., Disp: , Rfl:    ergocalciferol (VITAMIN D2) 1.25 MG (50000 UT) capsule, Take 50,000 Units by mouth once a week. Fridays, Disp: , Rfl:    famotidine (PEPCID) 20 MG  tablet, Take 1 tablet (20 mg total) by mouth 2 (two) times daily., Disp: 30 tablet, Rfl: 0   fluticasone (FLONASE) 50 MCG/ACT nasal spray, Place 1 spray into the nose in the morning, at noon, and at bedtime., Disp: , Rfl:    ipratropium (ATROVENT) 0.06 % nasal spray, Place 2 sprays into both nostrils 3 (three) times daily., Disp: , Rfl:    ipratropium-albuterol (DUONEB)  0.5-2.5 (3) MG/3ML SOLN, Take 3 mLs by nebulization in the morning, at noon, in the evening, and at bedtime., Disp: , Rfl:    levocetirizine (XYZAL) 5 MG tablet, Take 5 mg by mouth every evening., Disp: , Rfl:    linaclotide (LINZESS) 290 MCG CAPS capsule, TAKE 1 CAPSULE BY MOUTH EVERY DAY BEFORE BREAKFAST, Disp: 30 capsule, Rfl: 6   meclizine (ANTIVERT) 25 MG tablet, Take 25 mg by mouth 2 (two) times daily as needed for dizziness., Disp: , Rfl:    montelukast (SINGULAIR) 10 MG tablet, TAKE 1 TABLET(10 MG) BY MOUTH AT BEDTIME, Disp: 30 tablet, Rfl: 1   potassium chloride SA (KLOR-CON M20) 20 MEQ tablet, Take 1 tablet (20 mEq total) by mouth as directed. 1 tablet daily. Take extra tab with Torsemide extra dose, Disp: 90 tablet, Rfl: 3   Respiratory Therapy Supplies (FLUTTER) DEVI, 1 application by Does not apply route as directed., Disp: 1 each, Rfl: 0   topiramate (TOPAMAX) 25 MG tablet, Take 25 mg by mouth 2 (two) times daily as needed (for migraines). , Disp: , Rfl:    traMADol (ULTRAM) 50 MG tablet, Take 1 tablet (50 mg total) by mouth 3 (three) times daily as needed for moderate pain., Disp: 90 tablet, Rfl: 1   TRELEGY ELLIPTA 200-62.5-25 MCG/INH AEPB, Inhale 1 puff into the lungs daily., Disp: , Rfl:    vitamin C (ASCORBIC ACID) 250 MG tablet, Take 250 mg by mouth daily., Disp: , Rfl:    FARXIGA 10 MG TABS tablet, Take 1 tablet (10 mg total) by mouth daily., Disp: 90 tablet, Rfl: 1   isosorbide dinitrate (ISORDIL) 20 MG tablet, Take 1 tablet (20 mg total) by mouth 2 (two) times daily., Disp: 270 tablet, Rfl: 3   rosuvastatin  (CRESTOR) 20 MG tablet, Take 1 tablet (20 mg total) by mouth daily., Disp: 90 tablet, Rfl: 3   spironolactone (ALDACTONE) 25 MG tablet, Take 1 tablet (25 mg total) by mouth every morning., Disp: 90 tablet, Rfl: 1   sucralfate (CARAFATE) 1 g tablet, Take 1 tablet (1 g total) by mouth 4 (four) times daily -  with meals and at bedtime for 15 days. (Patient taking differently: Take 1 g by mouth 2 (two) times daily.), Disp: 60 tablet, Rfl: 0   torsemide (DEMADEX) 20 MG tablet, Take 2 tablets (40 mg total) by mouth as directed. Daily. Take extra dose for fluid as needed, Disp: 360 tablet, Rfl: 0  Laboratory examination:   Recent Labs    04/13/21 0409 04/16/21 1258 06/08/21 0820 06/14/21 1250 07/23/21 1312  NA 138 137 134* 140 141  K 4.1 3.3* 2.9* 4.6 4.7  CL 110 95* 86* 103 104  CO2 '24 30 31 22 24  '$ GLUCOSE 103* 120* 142* 129* 98  BUN 19 25* 35* 23 27  CREATININE 0.80 1.15* 1.85* 1.27* 1.14*  CALCIUM 8.8* 10.3 10.1 9.8 9.7  GFRNONAA >60 54* 30*  --   --    estimated creatinine clearance is 65.1 mL/min (A) (by C-G formula based on SCr of 1.14 mg/dL (H)).      Latest Ref Rng & Units 07/23/2021    1:12 PM 06/14/2021   12:50 PM 06/08/2021    8:20 AM  CMP  Glucose 70 - 99 mg/dL 98  129  142   BUN 8 - 27 mg/dL 27  23  35   Creatinine 0.57 - 1.00 mg/dL 1.14  1.27  1.85   Sodium 134 - 144 mmol/L 141  140  134   Potassium 3.5 - 5.2 mmol/L 4.7  4.6  2.9   Chloride 96 - 106 mmol/L 104  103  86   CO2 20 - 29 mmol/L '24  22  31   '$ Calcium 8.7 - 10.3 mg/dL 9.7  9.8  10.1   Total Protein 6.5 - 8.1 g/dL   9.1   Total Bilirubin 0.3 - 1.2 mg/dL   1.0   Alkaline Phos 38 - 126 U/L   100   AST 15 - 41 U/L   26   ALT 0 - 44 U/L   20       Latest Ref Rng & Units 06/08/2021    8:20 AM 04/16/2021   12:58 PM 04/13/2021    4:09 AM  CBC  WBC 4.0 - 10.5 K/uL 11.7  11.5  11.2   Hemoglobin 12.0 - 15.0 g/dL 14.4  12.4  10.3   Hematocrit 36.0 - 46.0 % 43.4  37.7  33.3   Platelets 150 - 400 K/uL 309  268  206      Lipid Panel No results for input(s): "CHOL", "TRIG", "[place]", "VLDL", "HDL", "CHOLHDL", "LDLDIRECT" in the last 8760 hours.  HEMOGLOBIN A1C Lab Results  Component Value Date   HGBA1C 5.8 (H) 02/19/2015   MPG 120 02/19/2015   TSH Recent Labs    06/14/21 1243  TSH 2.010    BNP (last 3 results) Recent Labs    10/22/20 1300 03/05/21 2031  BNP 27.1 25.7    ProBNP (last 3 results) Recent Labs    06/14/21 1250 07/23/21 1312  PROBNP 284 239   Radiology:    Cardiac Studies:   Extended outpatient telemetry 11/06/2016: Predominant rhythm is normal sinus rhythm.  Average heart rate is 87 bpm. Patient symptoms of palpitations correlated with sinus tachycardia at 120 bpm.  Occasional PACs.  Lexiscan nuclear stress test 04/01/2017: LVEF 77%, no wall motion abnormality.  Increased wall thickness. No evidence of ischemia.  Low risk study.  Echocardiogram 03/23/2019:  1. Left ventricular ejection fraction, by estimation, is >75%. The left ventricle has hyperdynamic function. The left ventricle has no regional wall motion abnormalities. There is moderate left ventricular hypertrophy. Left ventricular diastolic  parameters are indeterminate.  2. Right ventricular systolic function was not well visualized. The right ventricular size is not well visualized.  3. The mitral valve was not well visualized. No evidence of mitral valve regurgitation.  4. The aortic valve was not well visualized. Aortic valve regurgitation is not visualized.  5. The inferior vena cava IVC measures <1.2 cm and spontaenously collapses, suggesting volume depletion and a low RA pressure <3 mmHg.  Coronary CTA 11/16/2020: Coronary calcium score of 0. Normal coronary origin with right dominance. Normal coronary arteries.  EKG:   EKG 06/14/2021: Normal sinus rhythm at rate of 63 bpm, normal axis, poor R wave progression, cannot exclude anteroseptal infarct old.  Low-voltage complexes.    Assessment      ICD-10-CM   1. Chronic diastolic heart failure (HCC)  I50.32 torsemide (DEMADEX) 20 MG tablet    spironolactone (ALDACTONE) 25 MG tablet    isosorbide dinitrate (ISORDIL) 20 MG tablet    FARXIGA 10 MG TABS tablet    potassium chloride SA (KLOR-CON M20) 20 MEQ tablet    2. Pure hypercholesterolemia  E78.00 rosuvastatin (CRESTOR) 20 MG tablet    3. Essential hypertension  I10 spironolactone (ALDACTONE) 25 MG tablet    Basic metabolic panel    Basic metabolic panel  Medications Discontinued During This Encounter  Medication Reason   guaiFENesin-codeine (ROBITUSSIN AC) 100-10 MG/5ML syrup    meloxicam (MOBIC) 15 MG tablet    pantoprazole (PROTONIX) 40 MG tablet    metolazone (ZAROXOLYN) 2.5 MG tablet No longer needed (for PRN medications)   Potassium Chloride ER 20 MEQ TBCR Change in therapy   torsemide (DEMADEX) 20 MG tablet    FARXIGA 10 MG TABS tablet Reorder   spironolactone (ALDACTONE) 25 MG tablet Reorder   rosuvastatin (CRESTOR) 20 MG tablet Reorder   isosorbide dinitrate (ISORDIL) 20 MG tablet Reorder    Meds ordered this encounter  Medications   torsemide (DEMADEX) 20 MG tablet    Sig: Take 2 tablets (40 mg total) by mouth as directed. Daily. Take extra dose for fluid as needed    Dispense:  360 tablet    Refill:  0   spironolactone (ALDACTONE) 25 MG tablet    Sig: Take 1 tablet (25 mg total) by mouth every morning.    Dispense:  90 tablet    Refill:  1   rosuvastatin (CRESTOR) 20 MG tablet    Sig: Take 1 tablet (20 mg total) by mouth daily.    Dispense:  90 tablet    Refill:  3   isosorbide dinitrate (ISORDIL) 20 MG tablet    Sig: Take 1 tablet (20 mg total) by mouth 2 (two) times daily.    Dispense:  270 tablet    Refill:  3   FARXIGA 10 MG TABS tablet    Sig: Take 1 tablet (10 mg total) by mouth daily.    Dispense:  90 tablet    Refill:  1   potassium chloride SA (KLOR-CON M20) 20 MEQ tablet    Sig: Take 1 tablet (20 mEq total) by mouth as directed. 1  tablet daily. Take extra tab with Torsemide extra dose    Dispense:  90 tablet    Refill:  3   Orders Placed This Encounter  Procedures   Basic metabolic panel    Standing Status:   Future    Number of Occurrences:   1    Standing Expiration Date:   08/02/2022   Recommendations:   GUILLERMINA SHAFT is a 61 y.o.  African-American female patient with chronic diastolic heart failure, normal coronary arteries by remote catheterization and also by coronary CTA in September 2022, primary hypertension, hyperlipidemia, morbid obesity, hyperglycemia, chronic stage IIIa kidney disease, OSA on BiPAP, previously evaluated by multiple cardiologists at [facility], follows Dr. Silverio Decamp for IBS and GERD, Dr. Madelon Lips for management of renal disease presents to establish care with me.  I had seen her a month ago, I had extensive discussion regarding diet management and medication management, she has lost 17 pounds in weight.  Leg edema has completely resolved.  Blood pressure is also very well controlled.  She is feeling much improved with regard to her arthritis and also dyspnea.  I also spoke to her daughter over the telephone today.  I encouraged her to continue to manage her diet and medications.  As she has not been very strict with her diet, she is avoiding excess salt, I will discontinue metolazone, reduce torsemide from 40 mg twice daily to 40 mg once a day and she can certainly take on a as needed basis twice daily.  Change potassium supplements from 20 mEq 3 tablets twice daily to 1 tablet daily and to take extra if she takes extra doses of torsemide.  I will  also try to reduce the dose to torsemide on her next office visit if she continues to do well.  We will also change spironolactone from 1/2 tablet twice daily to once a day.  As the blood pressure is improved and normal and in fact is soft, advised her that she can monitor her blood pressure at home and if it persistently is <130/80 mmHg, she could  certainly discontinue isosorbide dinitrate as well.  For heart failure, I have refilled all the prescriptions.  Also refilled the prescription for hypercholesterolemia.  I would like to see her back in 1 month and will repeat BMP to follow-up on hypokalemia and renal function in 2 weeks.    Adrian Prows, MD, [facility] 08/01/2021, 1:52 PM Office: [phone]            CC: Drs. Precious Haws and Madelon Lips

## 2021-08-02 ENCOUNTER — Encounter: Payer: Self-pay | Admitting: Gastroenterology

## 2021-08-02 ENCOUNTER — Ambulatory Visit (INDEPENDENT_AMBULATORY_CARE_PROVIDER_SITE_OTHER): Payer: 59 | Admitting: Gastroenterology

## 2021-08-02 VITALS — BP 122/82 | HR 68 | Ht 63.0 in | Wt 270.0 lb

## 2021-08-02 DIAGNOSIS — K581 Irritable bowel syndrome with constipation: Secondary | ICD-10-CM | POA: Diagnosis not present

## 2021-08-02 DIAGNOSIS — K219 Gastro-esophageal reflux disease without esophagitis: Secondary | ICD-10-CM | POA: Diagnosis not present

## 2021-08-02 MED ORDER — DEXLANSOPRAZOLE 60 MG PO CPDR: DELAYED_RELEASE_CAPSULE | ORAL | 3 refills | Status: DC

## 2021-08-02 MED ORDER — LINACLOTIDE 290 MCG PO CAPS: ORAL_CAPSULE | ORAL | 6 refills | Status: DC

## 2021-08-02 NOTE — Progress Notes (Signed)
MACAILA TAHIR    [id number]    1960/02/20  Primary Care Physician:Boyd, Dola Factor, MD  Referring Physician: Bartholome Bill, MD [place],  [place] [address]   Chief complaint:  GERD  HPI: 61 year old very pleasant female with history of morbid obesity, obstructive sleep apnea, COPD here for follow-up visit for constipation and GERD.   Overall GERD symptoms are currently under control on Dexilant.  She is not having significant breakthrough symptoms.  She is losing weight with dietary changes.  She is not longer on Ozempic   Bowel habits are regular on Linzess.  Denies any rectal bleeding or melena.   Relevant GI history: EGD 12/09/18 - The esophagus was normal. - A large amount of a phytobezoar was found in the gastric body with no evidence of gastric outlet obstruction. Limited visualization of the gastric mucosa - The examined duodenum was normal.   EGD July 28, 2018: Few superficial linear nonbleeding gastric ulcers in the antrum and gastritis.  Benign fundic gland polyps.  Esophagus and duodenum was normal.   EGD June 22, 2018 with moderate amount of residue food in the body and fundus of stomach with limited visualization of the lining, gastritis, biopsies negative for H. pylori   Gastric emptying scan July 08, 2018: Normal   CT abdomen pelvis March 30, 2018: No acute intra-abdominal or pelvic abnormality, moderate fat-containing umbilical hernia   Colonoscopy April 2019 with removal of 11 mm hamartomatous polyp in sigmoid colon   Colonoscopy November 04, 2003 normal with no polyps    Outpatient Encounter Medications as of 08/02/2021  Medication Sig   acetaminophen (TYLENOL) 650 MG CR tablet Take 650 mg by mouth every 8 (eight) hours as needed for pain (headache).   Azelastine-Fluticasone 137-50 MCG/ACT SUSP Place 137 mcg into the nose in the morning, at noon, in the evening, and at bedtime.   benzonatate (TESSALON)  200 MG capsule Take 200 mg by mouth 3 (three) times daily.   carvedilol (COREG) 12.5 MG tablet Take 0.5 tablets (6.25 mg total) by mouth daily.   dexlansoprazole (DEXILANT) 60 MG capsule TAKE 1 CAPSULE (60 MG TOTAL) BY MOUTH DAILY. DAILY IN THE MORNING   dicyclomine (BENTYL) 10 MG capsule Take 1 capsule (10 mg total) by mouth every 8 (eight) hours as needed for spasms.   EPINEPHrine 0.3 mg/0.3 mL IJ SOAJ injection Inject 0.3 mg into the muscle as needed for anaphylaxis.   ergocalciferol (VITAMIN D2) 1.25 MG (50000 UT) capsule Take 50,000 Units by mouth once a week. Fridays   FARXIGA 10 MG TABS tablet Take 1 tablet (10 mg total) by mouth daily.   fluticasone (FLONASE) 50 MCG/ACT nasal spray Place 1 spray into the nose in the morning, at noon, and at bedtime.   ipratropium (ATROVENT) 0.06 % nasal spray Place 2 sprays into both nostrils 3 (three) times daily.   ipratropium-albuterol (DUONEB) 0.5-2.5 (3) MG/3ML SOLN Take 3 mLs by nebulization in the morning, at noon, in the evening, and at bedtime.   isosorbide dinitrate (ISORDIL) 20 MG tablet Take 1 tablet (20 mg total) by mouth 2 (two) times daily.   levocetirizine (XYZAL) 5 MG tablet Take 5 mg by mouth every evening.   linaclotide (LINZESS) 290 MCG CAPS capsule TAKE 1 CAPSULE BY MOUTH EVERY DAY BEFORE BREAKFAST   meclizine (ANTIVERT) 25 MG tablet Take 25 mg by mouth 2 (two) times daily as needed for dizziness.  montelukast (SINGULAIR) 10 MG tablet TAKE 1 TABLET(10 MG) BY MOUTH AT BEDTIME   potassium chloride SA (KLOR-CON M20) 20 MEQ tablet Take 1 tablet (20 mEq total) by mouth as directed. 1 tablet daily. Take extra tab with Torsemide extra dose   Respiratory Therapy Supplies (FLUTTER) DEVI 1 application by Does not apply route as directed.   rosuvastatin (CRESTOR) 20 MG tablet Take 1 tablet (20 mg total) by mouth daily.   spironolactone (ALDACTONE) 25 MG tablet Take 1 tablet (25 mg total) by mouth every morning.   topiramate (TOPAMAX) 25 MG  tablet Take 25 mg by mouth 2 (two) times daily as needed (for migraines).    torsemide (DEMADEX) 20 MG tablet Take 2 tablets (40 mg total) by mouth as directed. Daily. Take extra dose for fluid as needed   traMADol (ULTRAM) 50 MG tablet Take 1 tablet (50 mg total) by mouth 3 (three) times daily as needed for moderate pain.   TRELEGY ELLIPTA 200-62.5-25 MCG/INH AEPB Inhale 1 puff into the lungs daily.   vitamin C (ASCORBIC ACID) 250 MG tablet Take 250 mg by mouth daily.   famotidine (PEPCID) 20 MG tablet Take 1 tablet (20 mg total) by mouth 2 (two) times daily. (Patient not taking: Reported on 08/02/2021)   [DISCONTINUED] sucralfate (CARAFATE) 1 g tablet Take 1 tablet (1 g total) by mouth 4 (four) times daily -  with meals and at bedtime for 15 days. (Patient taking differently: Take 1 g by mouth 2 (two) times daily.)   No facility-administered encounter medications on file as of 08/02/2021.    Allergies as of 08/02/2021 - Review Complete 08/02/2021  Allergen Reaction Noted   Sulfonamide derivatives Anaphylaxis, Swelling, Rash, and Other (See Comments) 10/21/2006   Doxycycline Nausea And Vomiting 01/05/2008   Latex Hives 09/28/2011   Ciprofloxacin Rash 10/21/2006   Fish allergy Rash and Other (See Comments) 03/08/2012   Hydrocortisone Rash    Neomycin Rash 10/21/2006    Past Medical History:  Diagnosis Date   Acute on chronic diastolic CHF (congestive heart failure) ([facility]) 04/11/2014   Acute suppurative otitis media without spontaneous rupture of eardrum 02/04/2007   Centricity Description: OTITIS MEDIA, SUPPURATIVE, ACUTE, BILATERAL Qualifier: Diagnosis of  By: [name] MD, [name]  Centricity Description: OTITIS MEDIA, SUPPURATIVE, ACUTE Qualifier: Diagnosis of  By: [name] MD, Sean A    Allergic rhinitis    Allergy    Anemia    Anxiety    Arthritis    Atherosclerosis of aorta ([facility]) 02/21/2021   CAD (coronary artery disease)    LHC (11/29/1999):  EF 60%; no significant CAD.   Carpal  tunnel syndrome    Carpal tunnel syndrome 06/09/2006   Qualifier: Diagnosis of  By: Marca Ancona RMA, Lucy     Chronic diastolic CHF (congestive heart failure) ([facility])    a. Echo (01/21/11): Vigorous LVF, EF 65-70%, no RWMA, Gr 2 DD. b.  Echo (12/15): Moderate LVH, EF 82-50%, grade 2 diastolic dysfunction, mild LAE, normal RV function c. 01/2015: echo showing EF of 60-65% with mild LVH   Common migraine    COPD (chronic obstructive pulmonary disease) ([facility])    [id number]    x 2   Diastolic heart failure ([facility])    Diverticulitis    Dyspnea    Hepatic steatosis    Hepatomegaly    Hiatal hernia    Hyperlipidemia    IBS (irritable bowel syndrome)    Internal hemorrhoids    Long COVID    Metabolic syndrome  Morbid obesity ([facility])    PNEUMONIA, ORGAN UNSPECIFIED 03/01/2009   Qualifier: Diagnosis of  By: [name] CMA, Jennifer     Sinusitis, chronic 03/10/2012   CT sinuses 02/2012:  Chronic sinusitis with small A/F levels >> rx by ENT with prolonged augmentin F/u ct sinuses 04/2012:  Persistent sinus thickening >> rx with levaquin and clinda for 30 days.  Told to return for sinus scan in 6 weeks >> no showed for followup visit.  CT sinuses 11/2013:  Acute and chronic sinusitis noted.      Past Surgical History:  Procedure Laterality Date   ABDOMINAL HYSTERECTOMY     BIOPSY  06/22/2018   Procedure: BIOPSY;  Surgeon: Mauri Pole, MD;  Location: [facility];  Service: Endoscopy;;   BIOPSY  07/28/2018   Procedure: BIOPSY;  Surgeon: Mauri Pole, MD;  Location: [facility];  Service: Endoscopy;;   CESAREAN SECTION     COLONOSCOPY     COLONOSCOPY WITH PROPOFOL N/A 05/05/2017   Procedure: COLONOSCOPY WITH PROPOFOL;  Surgeon: Mauri Pole, MD;  Location: [facility];  Service: Endoscopy;  Laterality: N/A;   ESOPHAGOGASTRODUODENOSCOPY (EGD) WITH PROPOFOL N/A 06/22/2018   Procedure: ESOPHAGOGASTRODUODENOSCOPY (EGD) WITH PROPOFOL;  Surgeon: Mauri Pole, MD;  Location: [facility];   Service: Endoscopy;  Laterality: N/A;   ESOPHAGOGASTRODUODENOSCOPY (EGD) WITH PROPOFOL N/A 07/28/2018   Procedure: ESOPHAGOGASTRODUODENOSCOPY (EGD) WITH PROPOFOL;  Surgeon: Mauri Pole, MD;  Location: [facility];  Service: Endoscopy;  Laterality: N/A;   NASAL SINUS SURGERY     PARTIAL HYSTERECTOMY     SINUS ENDO WITH FUSION Left 05/28/2019   Procedure: SINUS ENDO WITH FUSION;  Surgeon: [name], MD;  Location: [facility];  Service: ENT;  Laterality: Left;   TONSILLECTOMY     TUBAL LIGATION     TURBINATE REDUCTION Bilateral 05/28/2019   Procedure: TURBINATE REDUCTION;  Surgeon: [name], MD;  Location: [facility];  Service: ENT;  Laterality: Bilateral;   UVULOPALATOPHARYNGOPLASTY      Family History  Problem Relation Age of Onset   Heart attack Mother 66       1st one @ 45, 50nd one 13   Heart disease Mother    Hypertension Mother    Asthma Sister    Colon polyps Sister    Diabetes Sister    Diabetes Sister    Diabetes Brother    Diabetes Brother    Diabetes Brother    Diabetes Brother    Diabetes Half-Sister    Stroke Neg Hx    Colon cancer Neg Hx    Esophageal cancer Neg Hx    Rectal cancer Neg Hx    Stomach cancer Neg Hx     Social History   Socioeconomic History   Marital status: Widowed    Spouse name: Not on file   Number of children: 2   Years of education: Not on file   Highest education level: Not on file  Occupational History   Occupation: Retired  Tobacco Use   Smoking status: Never   Smokeless tobacco: Never  Vaping Use   Vaping Use: Never used  Substance and Sexual Activity   Alcohol use: No   Drug use: No   Sexual activity: Not on file  Other Topics Concern   Not on file  Social History Narrative   Charity fundraiser, Oceanographer.   Widowed, lives with son.   Social Determinants of Health   Financial Resource Strain: Not on file  Food Insecurity: Not on file  Transportation Needs: Not on file  Physical Activity: Not on file   Stress: Not on file  Social Connections: Not on file  Intimate Partner Violence: Not on file      Review of systems: All other review of systems negative except as mentioned in the HPI.   Physical Exam: There were no vitals filed for this visit. Body mass index is 47.83 kg/m. Gen:      No acute distress HEENT:  sclera anicteric Abd:      soft, non-tender; no palpable masses, no distension Ext:    No edema Neuro: alert and oriented x 3 Psych: normal mood and affect  Data Reviewed:  Reviewed labs, radiology imaging, old records and pertinent past GI work up   Assessment and Plan/Recommendations:  61 year old very pleasant female with morbid obesity, hypertension, hyperlipidemia, COPD, OSA, diastolic dysfunction, recent Covid 19, pulmonary infiltrates treated with multiple antibiotics and prolonged hospitalization, here for follow-up for chronic GERD and constipation   IBS constipation: Use Linzess 290 mcg daily and add 1 capful of MiraLAX daily as needed Discussed high-fiber diet and increase water intake  GERD: Dexilant 60 mg daily  Anti-reflux measures  Morbid obesity: Encourage patient to continue with dietary changes and daily exercise as tolerated  Return in 3 months or sooner if needed     This visit required >40 minutes of patient care (this includes precharting, chart review, review of results, face-to-face time used for counseling as well as treatment plan and follow-up. The patient was provided an opportunity to ask questions and all were answered. The patient agreed with the plan and demonstrated an understanding of the instructions.  Damaris Hippo , MD    CC: Bartholome Bill, MD

## 2021-08-02 NOTE — Patient Instructions (Addendum)
Follow up in 3 months  We have sent the following medications to your pharmacy for you to pick up at your convenience:   Linzess  Dexilant    If you are age 63 or older, your body mass index should be between 23-30. Your Body mass index is 47.83 kg/m. If this is out of the aforementioned range listed, please consider follow up with your Primary Care Provider.  If you are age 87 or younger, your body mass index should be between 19-25. Your Body mass index is 47.83 kg/m. If this is out of the aformentioned range listed, please consider follow up with your Primary Care Provider.   ________________________________________________________  The [facility] providers would like to encourage you to use [facility] to communicate with providers for non-urgent requests or questions.  Due to long hold times on the telephone, sending your provider a message by [facility] may be a faster and more efficient way to get a response.  Please allow 48 business hours for a response.  Please remember that this is for non-urgent requests.  _______________________________________________________   Thank you for choosing [facility]  Kavitha Nandigam,MD

## 2021-08-03 ENCOUNTER — Other Ambulatory Visit (HOSPITAL_COMMUNITY)
Admission: RE | Admit: 2021-08-03 | Discharge: 2021-08-03 | Disposition: A | Discharge status: Home / Self Care | Payer: 59 | Source: Ambulatory Visit | Attending: Endocrinology | Admitting: Endocrinology

## 2021-08-03 ENCOUNTER — Ambulatory Visit
Admission: RE | Admit: 2021-08-03 | Discharge: 2021-08-03 | Disposition: A | Discharge status: Home / Self Care | Payer: 59 | Source: Ambulatory Visit | Attending: Endocrinology | Admitting: Endocrinology

## 2021-08-03 DIAGNOSIS — E041 Nontoxic single thyroid nodule: Secondary | ICD-10-CM | POA: Diagnosis present

## 2021-08-08 ENCOUNTER — Ambulatory Visit: Payer: 59 | Admitting: Gastroenterology

## 2021-08-09 LAB — CYTOLOGY - NON PAP

## 2021-08-15 ENCOUNTER — Encounter: Payer: Self-pay | Admitting: Gastroenterology

## 2021-08-28 LAB — BASIC METABOLIC PANEL
BUN/Creatinine Ratio: 17 (ref 12–28)
BUN: 20 mg/dL (ref 8–27)
CO2: 22 mmol/L (ref 20–29)
Calcium: 9.5 mg/dL (ref 8.7–10.3)
Chloride: 105 mmol/L (ref 96–106)
Creatinine, Ser: 1.18 mg/dL — ABNORMAL HIGH (ref 0.57–1.00)
Glucose: 109 mg/dL — ABNORMAL HIGH (ref 70–99)
Potassium: 3.7 mmol/L (ref 3.5–5.2)
Sodium: 142 mmol/L (ref 134–144)
eGFR: 52 mL/min/{1.73_m2} — ABNORMAL LOW (ref >59)

## 2021-08-30 ENCOUNTER — Ambulatory Visit: Payer: 59 | Admitting: Cardiology

## 2021-08-30 ENCOUNTER — Encounter: Payer: Self-pay | Admitting: Cardiology

## 2021-08-30 VITALS — BP 128/82 | HR 51 | Temp 97.8°F | Resp 16 | Ht 63.0 in | Wt 267.2 lb

## 2021-08-30 DIAGNOSIS — G4733 Obstructive sleep apnea (adult) (pediatric): Secondary | ICD-10-CM

## 2021-08-30 DIAGNOSIS — I10 Essential (primary) hypertension: Secondary | ICD-10-CM

## 2021-08-30 DIAGNOSIS — I5032 Chronic diastolic (congestive) heart failure: Secondary | ICD-10-CM

## 2021-08-30 MED ORDER — POTASSIUM CHLORIDE CRYS ER 20 MEQ PO TBCR: 20.0000 meq | EXTENDED_RELEASE_TABLET | ORAL | 3 refills | Status: DC

## 2021-08-30 MED ORDER — TORSEMIDE 20 MG PO TABS: 20.0000 mg | ORAL_TABLET | ORAL | 0 refills | Status: DC

## 2021-08-30 NOTE — Progress Notes (Signed)
Primary Physician/Referring:  Bartholome Bill, MD  Patient ID: [name], female    DOB: [birth year], 61 y.o.   MRN: [id number]  Chief Complaint  Patient presents with   Congestive Heart Failure   Hypertension   Follow-up    4 weeks   HPI:    [name]  is a 61 y.o. African-American female patient with chronic diastolic heart failure, normal coronary arteries by remote catheterization and also by coronary CTA in September 2022, primary hypertension, hyperlipidemia, morbid obesity, hyperglycemia, chronic stage IIIa kidney disease, OSA on BiPAP, previously evaluated by multiple cardiologists at [facility], follows Dr. Silverio Decamp for IBS and GERD, Dr. Madelon Lips for management of renal disease presents for follow-up of heart failure, hypertension management.  She is presently doing well and continues to lose weight and has been extremely careful with her diet, has been compliant with medications as well.  No specific complaints today, dyspnea has remained stable, no leg edema, no PND or orthopnea.  Past Medical History:  Diagnosis Date   Acute on chronic diastolic CHF (congestive heart failure) ([place]) 04/11/2014   Acute suppurative otitis media without spontaneous rupture of eardrum 02/04/2007   Centricity Description: OTITIS MEDIA, SUPPURATIVE, ACUTE, BILATERAL Qualifier: Diagnosis of  By: [name] MD, [name]  Centricity Description: OTITIS MEDIA, SUPPURATIVE, ACUTE Qualifier: Diagnosis of  By: [name] MD, Sean A    Allergic rhinitis    Allergy    Anemia    Anxiety    Arthritis    Atherosclerosis of aorta ([place]) 02/21/2021   CAD (coronary artery disease)    LHC (11/29/1999):  EF 60%; no significant CAD.   Carpal tunnel syndrome    Carpal tunnel syndrome 06/09/2006   Qualifier: Diagnosis of  By: Marca Ancona RMA, Lucy     Chronic diastolic CHF (congestive heart failure) ([place])    a. Echo (01/21/11): Vigorous LVF, EF 65-70%, no RWMA, Gr 2 DD. b.  Echo (12/15): Moderate LVH, EF 60-65%,  grade 2 diastolic dysfunction, mild LAE, normal RV function c. 01/2015: echo showing EF of 60-65% with mild LVH   Common migraine    COPD (chronic obstructive pulmonary disease) (HCC)    [id number]    x 2   Diastolic heart failure (HCC)    Diverticulitis    Dyspnea    Hepatic steatosis    Hepatomegaly    Hiatal hernia    Hyperlipidemia    IBS (irritable bowel syndrome)    Internal hemorrhoids    Long COVID    Metabolic syndrome    Morbid obesity ([place])    PNEUMONIA, ORGAN UNSPECIFIED 03/01/2009   Qualifier: Diagnosis of  By: [name] CMA, Jennifer     Sinusitis, chronic 03/10/2012   CT sinuses 02/2012:  Chronic sinusitis with small A/F levels >> rx by ENT with prolonged augmentin F/u ct sinuses 04/2012:  Persistent sinus thickening >> rx with levaquin and clinda for 30 days.  Told to return for sinus scan in 6 weeks >> no showed for followup visit.  CT sinuses 11/2013:  Acute and chronic sinusitis noted.     Social History   Tobacco Use   Smoking status: Never   Smokeless tobacco: Never  Substance Use Topics   Alcohol use: No   Marital Status: Widowed  ROS  Review of Systems  Cardiovascular:  Positive for dyspnea on exertion. Negative for chest pain and leg swelling.   Objective  Blood pressure 128/82, pulse (!) 51, temperature 97.8 F (36.6 C), temperature source Temporal, resp.  rate 16, height '5\' 3"'$  (1.6 m), weight 267 lb 3.2 oz (121.2 kg), SpO2 99 %. Body mass index is 47.33 kg/m.     08/30/2021    3:15 PM 08/02/2021    1:51 PM 08/01/2021    1:02 PM  Vitals with BMI  Height '5\' 3"'$  '5\' 3"'$  '5\' 3"'$   Weight 267 lbs 3 oz 270 lbs 271 lbs  BMI 47.34 44.03 47.42  Systolic 595 638 756  Diastolic 82 82 75  Pulse 51 68 90    Physical Exam Constitutional:      Comments: Morbidly obese in no acute distress.  Neck:     Vascular: No carotid bruit or JVD.  Cardiovascular:     Rate and Rhythm: Normal rate and regular rhythm.     Pulses: Intact distal pulses.     Heart sounds: Normal  heart sounds. No murmur heard.    No gallop.  Pulmonary:     Effort: Pulmonary effort is normal.     Breath sounds: Normal breath sounds.  Abdominal:     General: Bowel sounds are normal.     Palpations: Abdomen is soft.     Comments: Obese. Pannus present  Musculoskeletal:     Right lower leg: No edema.     Left lower leg: No edema.     Medications and allergies   Allergies  Allergen Reactions   Sulfonamide Derivatives Anaphylaxis, Swelling, Rash and Other (See Comments)    Swelling tongue and ear    Doxycycline Nausea And Vomiting   Latex Hives   Ciprofloxacin Rash   Fish Allergy Rash and Other (See Comments)    Only reaction to Mackerel.  No other issues with any type of fish or shellfish currently    Hydrocortisone Rash   Neomycin Rash     Medication list after today's encounter   Current Outpatient Medications:    acetaminophen (TYLENOL) 650 MG CR tablet, Take 650 mg by mouth every 8 (eight) hours as needed for pain (headache)., Disp: , Rfl:    Azelastine-Fluticasone 137-50 MCG/ACT SUSP, Place 137 mcg into the nose in the morning, at noon, in the evening, and at bedtime., Disp: , Rfl:    benzonatate (TESSALON) 200 MG capsule, Take 200 mg by mouth 3 (three) times daily., Disp: , Rfl:    carvedilol (COREG) 12.5 MG tablet, Take 0.5 tablets (6.25 mg total) by mouth daily., Disp: 180 tablet, Rfl: 2   dexlansoprazole (DEXILANT) 60 MG capsule, TAKE 1 CAPSULE (60 MG TOTAL) BY MOUTH DAILY. DAILY IN THE MORNING, Disp: 90 capsule, Rfl: 3   dicyclomine (BENTYL) 10 MG capsule, Take 1 capsule (10 mg total) by mouth every 8 (eight) hours as needed for spasms., Disp: 60 capsule, Rfl: 1   EPINEPHrine 0.3 mg/0.3 mL IJ SOAJ injection, Inject 0.3 mg into the muscle as needed for anaphylaxis., Disp: , Rfl:    ergocalciferol (VITAMIN D2) 1.25 MG (50000 UT) capsule, Take 50,000 Units by mouth once a week. Fridays, Disp: , Rfl:    famotidine (PEPCID) 20 MG tablet, Take 1 tablet (20 mg total) by  mouth 2 (two) times daily., Disp: 30 tablet, Rfl: 0   FARXIGA 10 MG TABS tablet, Take 1 tablet (10 mg total) by mouth daily., Disp: 90 tablet, Rfl: 1   fluticasone (FLONASE) 50 MCG/ACT nasal spray, Place 1 spray into the nose in the morning, at noon, and at bedtime., Disp: , Rfl:    ipratropium (ATROVENT) 0.06 % nasal spray, Place 2 sprays into both  nostrils 3 (three) times daily., Disp: , Rfl:    ipratropium-albuterol (DUONEB) 0.5-2.5 (3) MG/3ML SOLN, Take 3 mLs by nebulization in the morning, at noon, in the evening, and at bedtime., Disp: , Rfl:    isosorbide dinitrate (ISORDIL) 20 MG tablet, Take 1 tablet (20 mg total) by mouth 2 (two) times daily., Disp: 270 tablet, Rfl: 3   levocetirizine (XYZAL) 5 MG tablet, Take 5 mg by mouth every evening., Disp: , Rfl:    linaclotide (LINZESS) 290 MCG CAPS capsule, TAKE 1 CAPSULE BY MOUTH EVERY DAY BEFORE BREAKFAST, Disp: 30 capsule, Rfl: 6   meclizine (ANTIVERT) 25 MG tablet, Take 25 mg by mouth 2 (two) times daily as needed for dizziness., Disp: , Rfl:    montelukast (SINGULAIR) 10 MG tablet, TAKE 1 TABLET(10 MG) BY MOUTH AT BEDTIME, Disp: 30 tablet, Rfl: 1   Respiratory Therapy Supplies (FLUTTER) DEVI, 1 application by Does not apply route as directed., Disp: 1 each, Rfl: 0   rosuvastatin (CRESTOR) 20 MG tablet, Take 1 tablet (20 mg total) by mouth daily., Disp: 90 tablet, Rfl: 3   spironolactone (ALDACTONE) 25 MG tablet, Take 1 tablet (25 mg total) by mouth every morning., Disp: 90 tablet, Rfl: 1   topiramate (TOPAMAX) 25 MG tablet, Take 25 mg by mouth 2 (two) times daily as needed (for migraines). , Disp: , Rfl:    traMADol (ULTRAM) 50 MG tablet, Take 1 tablet (50 mg total) by mouth 3 (three) times daily as needed for moderate pain., Disp: 90 tablet, Rfl: 1   TRELEGY ELLIPTA 200-62.5-25 MCG/INH AEPB, Inhale 1 puff into the lungs daily., Disp: , Rfl:    vitamin C (ASCORBIC ACID) 250 MG tablet, Take 250 mg by mouth daily., Disp: , Rfl:    potassium  chloride SA (KLOR-CON M20) 20 MEQ tablet, Take 1 tablet (20 mEq total) by mouth as directed. Take 1 tablet every time you take torsemide, Disp: 90 tablet, Rfl: 3   torsemide (DEMADEX) 20 MG tablet, Take 1 tablet (20 mg total) by mouth as directed. Daily for 1 week. If no leg swelling or weight gain, take every other day for 1 week. If no swelling or weight gain, can stop and take only as needed for weight gain of 3 Lbs or leg swelling., Disp: 360 tablet, Rfl: 0  Laboratory examination:   Recent Labs    04/13/21 0409 04/16/21 1258 06/08/21 0820 06/14/21 1250 07/23/21 1312 08/27/21 1139  NA 138 137 134* 140 141 142  K 4.1 3.3* 2.9* 4.6 4.7 3.7  CL 110 95* 86* 103 104 105  CO2 '24 30 31 22 24 22  '$ GLUCOSE 103* 120* 142* 129* 98 109*  BUN 19 25* 35* '23 27 20  '$ CREATININE 0.80 1.15* 1.85* 1.27* 1.14* 1.18*  CALCIUM 8.8* 10.3 10.1 9.8 9.7 9.5  GFRNONAA >60 54* 30*  --   --   --    estimated creatinine clearance is 62.4 mL/min (A) (by C-G formula based on SCr of 1.18 mg/dL (H)).      Latest Ref Rng & Units 08/27/2021   11:39 AM 07/23/2021    1:12 PM 06/14/2021   12:50 PM  CMP  Glucose 70 - 99 mg/dL 109  98  129   BUN 8 - 27 mg/dL '20  27  23   '$ Creatinine 0.57 - 1.00 mg/dL 1.18  1.14  1.27   Sodium 134 - 144 mmol/L 142  141  140   Potassium 3.5 - 5.2 mmol/L 3.7  4.7  4.6   Chloride 96 - 106 mmol/L 105  104  103   CO2 20 - 29 mmol/L '22  24  22   '$ Calcium 8.7 - 10.3 mg/dL 9.5  9.7  9.8       Latest Ref Rng & Units 06/08/2021    8:20 AM 04/16/2021   12:58 PM 04/13/2021    4:09 AM  CBC  WBC 4.0 - 10.5 K/uL 11.7  11.5  11.2   Hemoglobin 12.0 - 15.0 g/dL 14.4  12.4  10.3   Hematocrit 36.0 - 46.0 % 43.4  37.7  33.3   Platelets 150 - 400 K/uL 309  268  206     Lipid Panel No results for input(s): "CHOL", "TRIG", "[place]", "VLDL", "HDL", "CHOLHDL", "LDLDIRECT" in the last 8760 hours.  HEMOGLOBIN A1C Lab Results  Component Value Date   HGBA1C 5.8 (H) 02/19/2015   MPG 120 02/19/2015    TSH Recent Labs    06/14/21 1243  TSH 2.010    BNP (last 3 results) Recent Labs    10/22/20 1300 03/05/21 2031  BNP 27.1 25.7    ProBNP (last 3 results) Recent Labs    06/14/21 1250 07/23/21 1312  PROBNP 284 239   Radiology:    Cardiac Studies:   Extended outpatient telemetry 11/06/2016: Predominant rhythm is normal sinus rhythm.  Average heart rate is 87 bpm. Patient symptoms of palpitations correlated with sinus tachycardia at 120 bpm.  Occasional PACs.  Lexiscan nuclear stress test 04/01/2017: LVEF 77%, no wall motion abnormality.  Increased wall thickness. No evidence of ischemia.  Low risk study.  Echocardiogram 03/23/2019:  1. Left ventricular ejection fraction, by estimation, is >75%. The left ventricle has hyperdynamic function. The left ventricle has no regional wall motion abnormalities. There is moderate left ventricular hypertrophy. Left ventricular diastolic  parameters are indeterminate.  2. Right ventricular systolic function was not well visualized. The right ventricular size is not well visualized.  3. The mitral valve was not well visualized. No evidence of mitral valve regurgitation.  4. The aortic valve was not well visualized. Aortic valve regurgitation is not visualized.  5. The inferior vena cava IVC measures <1.2 cm and spontaenously collapses, suggesting volume depletion and a low RA pressure <3 mmHg.  Coronary CTA 11/16/2020: Coronary calcium score of 0. Normal coronary origin with right dominance. Normal coronary arteries.  EKG:   EKG 06/14/2021: Normal sinus rhythm at rate of 63 bpm, normal axis, poor R wave progression, cannot exclude anteroseptal infarct old.  Low-voltage complexes.    Assessment     ICD-10-CM   1. Chronic diastolic heart failure (HCC)  I50.32 torsemide (DEMADEX) 20 MG tablet    potassium chloride SA (KLOR-CON M20) 20 MEQ tablet    2. Class 3 severe obesity due to excess calories without serious comorbidity with  body mass index (BMI) of 50.0 to 59.9 in adult (HCC)  E66.01    Z68.43     3. Obstructive sleep apnea  G47.33     4. Primary hypertension  I10       Medications Discontinued During This Encounter  Medication Reason   torsemide (DEMADEX) 20 MG tablet    potassium chloride SA (KLOR-CON M20) 20 MEQ tablet     Meds ordered this encounter  Medications   torsemide (DEMADEX) 20 MG tablet    Sig: Take 1 tablet (20 mg total) by mouth as directed. Daily for 1 week. If no leg swelling or weight gain, take every other  day for 1 week. If no swelling or weight gain, can stop and take only as needed for weight gain of 3 Lbs or leg swelling.    Dispense:  360 tablet    Refill:  0   potassium chloride SA (KLOR-CON M20) 20 MEQ tablet    Sig: Take 1 tablet (20 mEq total) by mouth as directed. Take 1 tablet every time you take torsemide    Dispense:  90 tablet    Refill:  3   No orders of the defined types were placed in this encounter.  Recommendations:   ANAIYA WISINSKI is a 61 y.o.  African-American female patient with chronic diastolic heart failure, normal coronary arteries by remote catheterization and also by coronary CTA in September 2022, primary hypertension, hyperlipidemia, morbid obesity, hyperglycemia, chronic stage IIIa kidney disease, OSA on BiPAP, previously evaluated by multiple cardiologists at [facility], follows Dr. Silverio Decamp for IBS and GERD, Dr. Madelon Lips for management of renal disease presents for follow-up of chronic diastolic heart failure and obesity management.  Since she has started seeing [place], she has lost about 37 pounds in weight, has been very compliant with medications and also with her diet.  Today she is not in acute decompensated heart failure.  She was taking high doses of torsemide which has slowly weaned her off.  Presently on 40 mg of torsemide daily, we will switch her to 20 mg daily for a week followed by every other day and then to be taken only on a as needed basis  for weight gain, dyspnea or leg edema.  Encouraged her to continue to exercise and also continue to watch her diet as she has been doing.  High calorie diet again discussed with the patient.  Overall I am very pleased with her progress.  She has been compliant with her sleep apnea CPAP.  Otherwise stable from cardiac standpoint, renal function is improved since diuretics were decreased.  Presently her creatinine clearance is >62 mL.  She is tolerating spironolactone and potassium levels have stabilized as well.  If she does well, we could certainly consider increasing the dose of the spironolactone both for hypertension and heart failure and avoid loop diuretics.   Adrian Prows, MD, [facility] 08/30/2021, 3:49 PM Office: [phone]            CC: Drs. Precious Haws and Madelon Lips

## 2021-08-30 NOTE — Patient Instructions (Signed)
   The main change we have done today for you include the following:  Take torsemide 20 mg every day for the next 1 week.  If you do not notice any weight gain of 2 to 3 pounds or leg swelling or shortness of breath, please start taking torsemide 20 mg every other day for a week.  If again do not see any change in your weight that is weight gain, no shortness of breath, no leg swelling, then he can only take the torsemide as needed for shortness of breath, weight gain of 3 pounds, leg swelling.  He will take potassium every time you take torsemide.

## 2021-09-03 ENCOUNTER — Ambulatory Visit: Payer: 59 | Admitting: Cardiology

## 2021-09-10 ENCOUNTER — Other Ambulatory Visit: Payer: Self-pay | Admitting: Family Medicine

## 2021-09-10 DIAGNOSIS — Z1231 Encounter for screening mammogram for malignant neoplasm of breast: Secondary | ICD-10-CM

## 2021-10-08 ENCOUNTER — Telehealth: Payer: Self-pay | Admitting: Gastroenterology

## 2021-10-08 NOTE — Telephone Encounter (Signed)
Inbound call from patient stating that she is having gas pains and they are going into her chest. Patient is requesting a call back to discuss. Please advise.

## 2021-10-08 NOTE — Telephone Encounter (Signed)
Complains of several days with gas pain in her chest. She gets relief by using baking soda and water. She will then belch and feel much better. This has been going on for several days.  She is on Linzess, 2 to 3 doses of Miralax daily , Pepcid and Dexilant. She is not eating spicy or fried foods. She no longer eats out, but cooks all her foods. She has lost about 20 lbs.  Please advise. Thanks

## 2021-10-09 NOTE — Telephone Encounter (Signed)
Please advise patient to use simethicone 1 capsule up to 3 times daily with meals, continue Dexilant.  Please schedule follow-up office visit, will consider breath test to exclude SIBO if continues to have persistent symptoms.  Thank you

## 2021-10-10 NOTE — Telephone Encounter (Signed)
Discussed with the patient. Appointment scheduled for 10/31/21 at 3:00 pm. Use simethicone 3 times daily PRN.

## 2021-10-17 ENCOUNTER — Other Ambulatory Visit: Payer: Self-pay

## 2021-10-17 ENCOUNTER — Other Ambulatory Visit: Payer: Self-pay | Admitting: Gastroenterology

## 2021-10-17 MED ORDER — FAMOTIDINE 20 MG PO TABS: ORAL_TABLET | ORAL | 3 refills | Status: DC

## 2021-10-19 ENCOUNTER — Other Ambulatory Visit: Payer: Self-pay | Admitting: Gastroenterology

## 2021-10-24 ENCOUNTER — Emergency Department (HOSPITAL_BASED_OUTPATIENT_CLINIC_OR_DEPARTMENT_OTHER): Payer: 59 | Admitting: Radiology

## 2021-10-24 ENCOUNTER — Encounter (HOSPITAL_BASED_OUTPATIENT_CLINIC_OR_DEPARTMENT_OTHER): Payer: Self-pay

## 2021-10-24 ENCOUNTER — Ambulatory Visit: Payer: 59

## 2021-10-24 ENCOUNTER — Emergency Department (HOSPITAL_BASED_OUTPATIENT_CLINIC_OR_DEPARTMENT_OTHER)
Admission: EM | Admit: 2021-10-24 | Discharge: 2021-10-24 | Disposition: A | Discharge status: Home / Self Care | Payer: 59 | Attending: Emergency Medicine | Admitting: Emergency Medicine

## 2021-10-24 ENCOUNTER — Other Ambulatory Visit: Payer: Self-pay

## 2021-10-24 ENCOUNTER — Emergency Department (HOSPITAL_BASED_OUTPATIENT_CLINIC_OR_DEPARTMENT_OTHER): Payer: 59

## 2021-10-24 DIAGNOSIS — R0789 Other chest pain: Secondary | ICD-10-CM | POA: Insufficient documentation

## 2021-10-24 DIAGNOSIS — R0602 Shortness of breath: Secondary | ICD-10-CM | POA: Diagnosis not present

## 2021-10-24 DIAGNOSIS — Z20822 Contact with and (suspected) exposure to covid-19: Secondary | ICD-10-CM | POA: Diagnosis not present

## 2021-10-24 DIAGNOSIS — Z91040 Latex allergy status: Secondary | ICD-10-CM | POA: Insufficient documentation

## 2021-10-24 DIAGNOSIS — R0981 Nasal congestion: Secondary | ICD-10-CM | POA: Diagnosis present

## 2021-10-24 DIAGNOSIS — Z8616 Personal history of COVID-19: Secondary | ICD-10-CM | POA: Insufficient documentation

## 2021-10-24 LAB — RESP PANEL BY RT-PCR (FLU A&B, COVID) ARPGX2
Influenza A by PCR: NEGATIVE
Influenza B by PCR: NEGATIVE
SARS Coronavirus 2 by RT PCR: NEGATIVE

## 2021-10-24 LAB — BRAIN NATRIURETIC PEPTIDE: B Natriuretic Peptide: 32.9 pg/mL (ref 0.0–100.0)

## 2021-10-24 LAB — D-DIMER, QUANTITATIVE: D-Dimer, Quant: 0.84 ug/mL-FEU — ABNORMAL HIGH (ref 0.00–0.50)

## 2021-10-24 LAB — CBC WITH DIFFERENTIAL/PLATELET
Abs Immature Granulocytes: 0.05 10*3/uL (ref 0.00–0.07)
Basophils Absolute: 0 10*3/uL (ref 0.0–0.1)
Basophils Relative: 0 %
Eosinophils Absolute: 0.4 10*3/uL (ref 0.0–0.5)
Eosinophils Relative: 3 %
HCT: 39.3 % (ref 36.0–46.0)
Hemoglobin: 12.5 g/dL (ref 12.0–15.0)
Immature Granulocytes: 0 %
Lymphocytes Relative: 24 %
Lymphs Abs: 3 10*3/uL (ref 0.7–4.0)
MCH: 30.1 pg (ref 26.0–34.0)
MCHC: 31.8 g/dL (ref 30.0–36.0)
MCV: 94.7 fL (ref 80.0–100.0)
Monocytes Absolute: 0.7 10*3/uL (ref 0.1–1.0)
Monocytes Relative: 6 %
Neutro Abs: 8.6 10*3/uL — ABNORMAL HIGH (ref 1.7–7.7)
Neutrophils Relative %: 67 %
Platelets: 253 10*3/uL (ref 150–400)
RBC: 4.15 MIL/uL (ref 3.87–5.11)
RDW: 14.6 % (ref 11.5–15.5)
WBC: 12.8 10*3/uL — ABNORMAL HIGH (ref 4.0–10.5)
nRBC: 0 % (ref 0.0–0.2)

## 2021-10-24 LAB — BASIC METABOLIC PANEL
Anion gap: 9 (ref 5–15)
BUN: 16 mg/dL (ref 8–23)
CO2: 25 mmol/L (ref 22–32)
Calcium: 9.5 mg/dL (ref 8.9–10.3)
Chloride: 107 mmol/L (ref 98–111)
Creatinine, Ser: 0.91 mg/dL (ref 0.44–1.00)
GFR, Estimated: >60 mL/min (ref >60)
Glucose, Bld: 99 mg/dL (ref 70–99)
Potassium: 3.2 mmol/L — ABNORMAL LOW (ref 3.5–5.1)
Sodium: 141 mmol/L (ref 135–145)

## 2021-10-24 LAB — TROPONIN I (HIGH SENSITIVITY)
Troponin I (High Sensitivity): 2 ng/L (ref <18)
Troponin I (High Sensitivity): 2 ng/L (ref <18)

## 2021-10-24 MED ORDER — IOHEXOL 350 MG/ML SOLN
100.0000 mL | Freq: Once | INTRAVENOUS | Status: AC | PRN
  Administered 2021-10-24: 100 mL via INTRAVENOUS

## 2021-10-24 NOTE — ED Provider Notes (Signed)
Care assumed from Dr. Kathrynn Humble.  Patient with chest congestion pending CT scan to rule out pulmonary embolism.  CT negative for pulmonary embolism or pneumonia.  Second troponin negative.  Low suspicion for ACS.  Stable for discharge per plan established by Dr. Kathrynn Humble.    Charlotte Essex, MD 10/24/21 1740

## 2021-10-24 NOTE — ED Notes (Signed)
Discharge paperwork given and verbally understood. 

## 2021-10-24 NOTE — ED Triage Notes (Signed)
Pt states that she had COVID in "Sept 2019" and 2020. Pt states that she has inflammation and congestion in her sinuses. Pt states that she has had increased phlegm. Pt is concerned for PNA.

## 2021-10-24 NOTE — ED Notes (Signed)
Pt's IV removed, per Dr. Wyvonnia Dusky.

## 2021-10-24 NOTE — Discharge Instructions (Signed)
There is no evidence of heart attack or blood clot in your lung.  No pneumonia.  Follow-up with your primary doctor and cardiologist.  Return to the ED with exertional chest pain, pain associate with shortness of breath, nausea, vomiting, sweating or other concerns

## 2021-10-31 ENCOUNTER — Encounter: Payer: Self-pay | Admitting: Gastroenterology

## 2021-10-31 ENCOUNTER — Ambulatory Visit (INDEPENDENT_AMBULATORY_CARE_PROVIDER_SITE_OTHER): Payer: 59 | Admitting: Gastroenterology

## 2021-10-31 VITALS — BP 138/82 | HR 65 | Ht 63.0 in | Wt 263.0 lb

## 2021-10-31 DIAGNOSIS — R1013 Epigastric pain: Secondary | ICD-10-CM | POA: Diagnosis not present

## 2021-10-31 DIAGNOSIS — R140 Abdominal distension (gaseous): Secondary | ICD-10-CM

## 2021-10-31 DIAGNOSIS — Z86010 Personal history of colonic polyps: Secondary | ICD-10-CM

## 2021-10-31 DIAGNOSIS — K219 Gastro-esophageal reflux disease without esophagitis: Secondary | ICD-10-CM

## 2021-10-31 MED ORDER — SUCRALFATE 1 G PO TABS: 1.0000 g | ORAL_TABLET | Freq: Two times a day (BID) | ORAL | 1 refills | Status: DC

## 2021-10-31 MED ORDER — NA SULFATE-K SULFATE-MG SULF 17.5-3.13-1.6 GM/177ML PO SOLN: 1.0000 | ORAL | 0 refills | Status: DC

## 2021-10-31 MED ORDER — DEXLANSOPRAZOLE 60 MG PO CPDR: DELAYED_RELEASE_CAPSULE | ORAL | 3 refills | Status: DC

## 2021-10-31 MED ORDER — LINACLOTIDE 290 MCG PO CAPS: ORAL_CAPSULE | ORAL | 6 refills | Status: DC

## 2021-10-31 NOTE — Patient Instructions (Addendum)
You have been scheduled for an endoscopy and colonoscopy. Please follow the written instructions given to you at your visit today. Please pick up your prep supplies at the pharmacy within the next 1-3 days. If you use inhalers (even only as needed), please bring them with you on the day of your procedure.  We have sent the following medications to your pharmacy for you to pick up at your convenience: Dexilant, Linzess, Carafate, Suprep  _______________________________________________________  If you are age 63 or older, your body mass index should be between 23-30. Your Body mass index is 46.59 kg/m. If this is out of the aforementioned range listed, please consider follow up with your Primary Care Provider.  If you are age 2 or younger, your body mass index should be between 19-25. Your Body mass index is 46.59 kg/m. If this is out of the aformentioned range listed, please consider follow up with your Primary Care Provider.   ________________________________________________________  The [facility] providers would like to encourage you to use [facility] to communicate with providers for non-urgent requests or questions.  Due to long hold times on the telephone, sending your provider a message by [facility] may be a faster and more efficient way to get a response.  Please allow 48 business hours for a response.  Please remember that this is for non-urgent requests.  _______________________________________________________  Thank you for choosing me and [facility].  Dr. Silverio Decamp

## 2021-10-31 NOTE — Progress Notes (Signed)
TAMU GOLZ    [id number]    09-24-1960  Primary Care Physician:Boyd, Dola Factor, MD  Referring Physician: Bartholome Bill, MD [address] [place],  [place] [address]   Chief complaint:  abdominal pain  HPI:  61 year old very pleasant female with history of morbid obesity, obstructive sleep apnea, COPD here for follow-up visit for constipation and GERD.  She is having intermittent generalized abdominal bloating, excess gas and abdominal discomfort   Overall GERD symptoms are currently under control on Dexilant.  She is not having significant breakthrough symptoms.  She is losing weight with dietary changes.     Bowel habits are regular on Linzess.  Denies any rectal bleeding or melena.   Relevant GI history: EGD 12/09/18 - The esophagus was normal. - A large amount of a phytobezoar was found in the gastric body with no evidence of gastric outlet obstruction. Limited visualization of the gastric mucosa - The examined duodenum was normal.   EGD July 28, 2018: Few superficial linear nonbleeding gastric ulcers in the antrum and gastritis.  Benign fundic gland polyps.  Esophagus and duodenum was normal.   EGD June 22, 2018 with moderate amount of residue food in the body and fundus of stomach with limited visualization of the lining, gastritis, biopsies negative for H. pylori   Gastric emptying scan July 08, 2018: Normal   CT abdomen pelvis March 30, 2018: No acute intra-abdominal or pelvic abnormality, moderate fat-containing umbilical hernia   Colonoscopy April 2019 with removal of 11 mm hamartomatous polyp in sigmoid colon   Colonoscopy November 04, 2003 normal with no polyps   Outpatient Encounter Medications as of 10/31/2021  Medication Sig   acetaminophen (TYLENOL) 650 MG CR tablet Take 650 mg by mouth every 8 (eight) hours as needed for pain (headache).   Azelastine-Fluticasone 137-50 MCG/ACT SUSP Place 137 mcg into the nose in  the morning, at noon, in the evening, and at bedtime.   benzonatate (TESSALON) 200 MG capsule Take 200 mg by mouth 3 (three) times daily.   carvedilol (COREG) 12.5 MG tablet Take 0.5 tablets (6.25 mg total) by mouth daily.   dexlansoprazole (DEXILANT) 60 MG capsule TAKE 1 CAPSULE (60 MG TOTAL) BY MOUTH DAILY. DAILY IN THE MORNING   dicyclomine (BENTYL) 10 MG capsule Take 1 capsule (10 mg total) by mouth every 8 (eight) hours as needed for spasms.   EPINEPHrine 0.3 mg/0.3 mL IJ SOAJ injection Inject 0.3 mg into the muscle as needed for anaphylaxis.   ergocalciferol (VITAMIN D2) 1.25 MG (50000 UT) capsule Take 50,000 Units by mouth once a week. Fridays   famotidine (PEPCID) 20 MG tablet TAKE 1 TABLET BY MOUTH DAILY AFTER SUPPER   FARXIGA 10 MG TABS tablet Take 1 tablet (10 mg total) by mouth daily.   fluticasone (FLONASE) 50 MCG/ACT nasal spray Place 1 spray into the nose in the morning, at noon, and at bedtime.   ipratropium (ATROVENT) 0.06 % nasal spray Place 2 sprays into both nostrils 3 (three) times daily.   ipratropium-albuterol (DUONEB) 0.5-2.5 (3) MG/3ML SOLN Take 3 mLs by nebulization in the morning, at noon, in the evening, and at bedtime.   isosorbide dinitrate (ISORDIL) 20 MG tablet Take 1 tablet (20 mg total) by mouth 2 (two) times daily.   levocetirizine (XYZAL) 5 MG tablet Take 5 mg by mouth every evening.   linaclotide (LINZESS) 290 MCG CAPS capsule TAKE 1 CAPSULE BY MOUTH  EVERY DAY BEFORE BREAKFAST   meclizine (ANTIVERT) 25 MG tablet Take 25 mg by mouth 2 (two) times daily as needed for dizziness.   montelukast (SINGULAIR) 10 MG tablet TAKE 1 TABLET(10 MG) BY MOUTH AT BEDTIME   potassium chloride SA (KLOR-CON M20) 20 MEQ tablet Take 1 tablet (20 mEq total) by mouth as directed. Take 1 tablet every time you take torsemide   Respiratory Therapy Supplies (FLUTTER) DEVI 1 application by Does not apply route as directed.   rosuvastatin (CRESTOR) 20 MG tablet Take 1 tablet (20 mg total)  by mouth daily.   spironolactone (ALDACTONE) 25 MG tablet Take 1 tablet (25 mg total) by mouth every morning.   topiramate (TOPAMAX) 25 MG tablet Take 25 mg by mouth 2 (two) times daily as needed (for migraines).    torsemide (DEMADEX) 20 MG tablet Take 1 tablet (20 mg total) by mouth as directed. Daily for 1 week. If no leg swelling or weight gain, take every other day for 1 week. If no swelling or weight gain, can stop and take only as needed for weight gain of 3 Lbs or leg swelling.   traMADol (ULTRAM) 50 MG tablet Take 1 tablet (50 mg total) by mouth 3 (three) times daily as needed for moderate pain.   TRELEGY ELLIPTA 200-62.5-25 MCG/INH AEPB Inhale 1 puff into the lungs daily.   vitamin C (ASCORBIC ACID) 250 MG tablet Take 250 mg by mouth daily.   No facility-administered encounter medications on file as of 10/31/2021.    Allergies as of 10/31/2021 - Review Complete 10/31/2021  Allergen Reaction Noted   Sulfonamide derivatives Anaphylaxis, Swelling, Rash, and Other (See Comments) 10/21/2006   Doxycycline Nausea And Vomiting 01/05/2008   Latex Hives 09/28/2011   Ciprofloxacin Rash 10/21/2006   Fish allergy Rash and Other (See Comments) 03/08/2012   Hydrocortisone Rash    Neomycin Rash 10/21/2006    Past Medical History:  Diagnosis Date   Acute on chronic diastolic CHF (congestive heart failure) ([facility]) 04/11/2014   Acute suppurative otitis media without spontaneous rupture of eardrum 02/04/2007   Centricity Description: OTITIS MEDIA, SUPPURATIVE, ACUTE, BILATERAL Qualifier: Diagnosis of  By: [name] MD, [name]  Centricity Description: OTITIS MEDIA, SUPPURATIVE, ACUTE Qualifier: Diagnosis of  By: [name] MD, Sean A    Allergic rhinitis    Allergy    Anemia    Anxiety    Arthritis    Atherosclerosis of aorta ([facility]) 02/21/2021   CAD (coronary artery disease)    LHC (11/29/1999):  EF 60%; no significant CAD.   Carpal tunnel syndrome    Carpal tunnel syndrome 06/09/2006   Qualifier:  Diagnosis of  By: Marca Ancona RMA, Lucy     Chronic diastolic CHF (congestive heart failure) ([facility])    a. Echo (01/21/11): Vigorous LVF, EF 65-70%, no RWMA, Gr 2 DD. b.  Echo (12/15): Moderate LVH, EF 81-01%, grade 2 diastolic dysfunction, mild LAE, normal RV function c. 01/2015: echo showing EF of 60-65% with mild LVH   Common migraine    COPD (chronic obstructive pulmonary disease) ([facility])    [id number]    x 2   Diastolic heart failure ([facility])    Diverticulitis    Dyspnea    Hepatic steatosis    Hepatomegaly    Hiatal hernia    Hyperlipidemia    IBS (irritable bowel syndrome)    Internal hemorrhoids    Long COVID    Metabolic syndrome    Morbid obesity ([facility])    PNEUMONIA, ORGAN UNSPECIFIED  03/01/2009   Qualifier: Diagnosis of  By: [name] CMA, Jennifer     Sinusitis, chronic 03/10/2012   CT sinuses 02/2012:  Chronic sinusitis with small A/F levels >> rx by ENT with prolonged augmentin F/u ct sinuses 04/2012:  Persistent sinus thickening >> rx with levaquin and clinda for 30 days.  Told to return for sinus scan in 6 weeks >> no showed for followup visit.  CT sinuses 11/2013:  Acute and chronic sinusitis noted.      Past Surgical History:  Procedure Laterality Date   ABDOMINAL HYSTERECTOMY     BIOPSY  06/22/2018   Procedure: BIOPSY;  Surgeon: Mauri Pole, MD;  Location: [facility];  Service: Endoscopy;;   BIOPSY  07/28/2018   Procedure: BIOPSY;  Surgeon: Mauri Pole, MD;  Location: [facility];  Service: Endoscopy;;   CESAREAN SECTION     COLONOSCOPY     COLONOSCOPY WITH PROPOFOL N/A 05/05/2017   Procedure: COLONOSCOPY WITH PROPOFOL;  Surgeon: Mauri Pole, MD;  Location: [facility];  Service: Endoscopy;  Laterality: N/A;   ESOPHAGOGASTRODUODENOSCOPY (EGD) WITH PROPOFOL N/A 06/22/2018   Procedure: ESOPHAGOGASTRODUODENOSCOPY (EGD) WITH PROPOFOL;  Surgeon: Mauri Pole, MD;  Location: [facility];  Service: Endoscopy;  Laterality: N/A;   ESOPHAGOGASTRODUODENOSCOPY  (EGD) WITH PROPOFOL N/A 07/28/2018   Procedure: ESOPHAGOGASTRODUODENOSCOPY (EGD) WITH PROPOFOL;  Surgeon: Mauri Pole, MD;  Location: [facility];  Service: Endoscopy;  Laterality: N/A;   NASAL SINUS SURGERY     PARTIAL HYSTERECTOMY     SINUS ENDO WITH FUSION Left 05/28/2019   Procedure: SINUS ENDO WITH FUSION;  Surgeon: [name], MD;  Location: [facility];  Service: ENT;  Laterality: Left;   TONSILLECTOMY     TUBAL LIGATION     TURBINATE REDUCTION Bilateral 05/28/2019   Procedure: TURBINATE REDUCTION;  Surgeon: [name], MD;  Location: [facility];  Service: ENT;  Laterality: Bilateral;   UVULOPALATOPHARYNGOPLASTY      Family History  Problem Relation Age of Onset   Heart attack Mother 63       1st one @ 17, 67nd one 32   Heart disease Mother    Hypertension Mother    Asthma Sister    Colon polyps Sister    Diabetes Sister    Diabetes Sister    Diabetes Brother    Diabetes Brother    Diabetes Brother    Diabetes Brother    Diabetes Half-Sister    Stroke Neg Hx    Colon cancer Neg Hx    Esophageal cancer Neg Hx    Rectal cancer Neg Hx    Stomach cancer Neg Hx     Social History   Socioeconomic History   Marital status: Widowed    Spouse name: Not on file   Number of children: 2   Years of education: Not on file   Highest education level: Not on file  Occupational History   Occupation: Retired  Tobacco Use   Smoking status: Never   Smokeless tobacco: Never  Vaping Use   Vaping Use: Never used  Substance and Sexual Activity   Alcohol use: No   Drug use: No   Sexual activity: Not on file  Other Topics Concern   Not on file  Social History Narrative   Charity fundraiser, Oceanographer.   Widowed, lives with son.   Social Determinants of Health   Financial Resource Strain: Not on file  Food Insecurity: Not on file  Transportation Needs: Not on file  Physical Activity:  Not on file  Stress: Not on file  Social Connections: Not on file  Intimate  Partner Violence: Not on file      Review of systems: All other review of systems negative except as mentioned in the HPI.   Physical Exam: Vitals:   10/31/21 1452  BP: 138/82  Pulse: 65   Body mass index is 46.59 kg/m. Gen:      No acute distress HEENT:  sclera anicteric Abd:      soft, non-tender; no palpable masses, no distension Ext:    No edema Neuro: alert and oriented x 3 Psych: normal mood and affect  Data Reviewed:  Reviewed labs, radiology imaging, old records and pertinent past GI work up   Assessment and Plan/Recommendations:  61 year old very pleasant female with morbid obesity, hypertension, hyperlipidemia, COPD, OSA, diastolic dysfunction,  here for follow-up for chronic GERD and constipation  She is experiencing generalized abdominal bloating, discomfort and excess gas.  She also has intermittent epigastric abdominal pain  Last EGD with limited is evaluation of gastric mucosa due to phytobezoar, has history of multiple gastric ulcers and due for surveillance colonoscopy, had a large hamartomatous polyp removed from sigmoid colon Schedule EGD and colonoscopy at [facility] The risks and benefits as well as alternatives of endoscopic procedure(s) have been discussed and reviewed. All questions answered. The patient agrees to proceed.    IBS constipation: Continue Linzess 290 mcg daily and add 1 capful of MiraLAX daily as needed Discussed high-fiber diet and increase water intake   GERD: Dexilant 60 mg daily  Use Carafate before meals and at bedtime as needed Anti-reflux measures     Return in 3 months or sooner if needed  This visit required 40 minutes of patient care (this includes precharting, chart review, review of results, face-to-face time used for counseling as well as treatment plan and follow-up. The patient was provided an opportunity to ask questions and all were answered. The patient agreed with the plan and demonstrated an  understanding of the instructions.  Damaris Hippo , MD    CC: Bartholome Bill, MD

## 2021-10-31 NOTE — ED Provider Notes (Signed)
[facility] EMERGENC[facility] DEPT Provider Note   CSN: [id number] Arrival date & time: 10/24/21  1006     History  Chief Complaint  Patient presents with   Nasal Congestion    [name] is a 61 y.o. female.  HPI    Patient comes in with chief complaint of nasal congestion.  She had COVID-19, and states that she suffers from [place] because of that.  She comes into the ER because of worsening shortness of breath, increased nasal congestion and yellow-green phlegm.  She is concerned for pneumonia.   Home Medications Prior to Admission medications   Medication Sig Start Date End Date Taking? Authorizing Provider  acetaminophen (TYLENOL) 650 MG CR tablet Take 650 mg by mouth every 8 (eight) hours as needed for pain (headache).    [provider]  Azelastine-Fluticasone 137-50 MCG/ACT SUSP Place 137 mcg into the nose in the morning, at noon, in the evening, and at bedtime.    [provider]  benzonatate (TESSALON) 200 MG capsule Take 200 mg by mouth 3 (three) times daily. 03/31/21   [provider]  carvedilol (COREG) 12.5 MG tablet Take 0.5 tablets (6.25 mg total) by mouth daily. 04/13/21   Georgette Shell, MD  dexlansoprazole (DEXILANT) 60 MG capsule TAKE 1 CAPSULE (60 MG TOTAL) BY MOUTH DAILY. DAILY IN THE MORNING 08/02/21   Mauri Pole, MD  dicyclomine (BENTYL) 10 MG capsule Take 1 capsule (10 mg total) by mouth every 8 (eight) hours as needed for spasms. 04/24/20   Mauri Pole, MD  EPINEPHrine 0.3 mg/0.3 mL IJ SOAJ injection Inject 0.3 mg into the muscle as needed for anaphylaxis. 12/19/19   [provider]  ergocalciferol (VITAMIN D2) 1.25 MG (50000 UT) capsule Take 50,000 Units by mouth once a week. Fridays 05/18/20   [provider]  famotidine (PEPCID) 20 MG tablet TAKE 1 TABLET BY MOUTH DAILY AFTER SUPPER 10/17/21   Nandigam, Venia Minks, MD  FARXIGA 10 MG TABS tablet Take 1 tablet (10 mg total) by mouth  daily. 08/01/21   Adrian Prows, MD  fluticasone (FLONASE) 50 MCG/ACT nasal spray Place 1 spray into the nose in the morning, at noon, and at bedtime. 03/03/20   [provider]  ipratropium (ATROVENT) 0.06 % nasal spray Place 2 sprays into both nostrils 3 (three) times daily.    [provider]  ipratropium-albuterol (DUONEB) 0.5-2.5 (3) MG/3ML SOLN Take 3 mLs by nebulization in the morning, at noon, in the evening, and at bedtime.    [provider]  isosorbide dinitrate (ISORDIL) 20 MG tablet Take 1 tablet (20 mg total) by mouth 2 (two) times daily. 08/01/21   Adrian Prows, MD  levocetirizine (XYZAL) 5 MG tablet Take 5 mg by mouth every evening.    [provider]  linaclotide (LINZESS) 290 MCG CAPS capsule TAKE 1 CAPSULE BY MOUTH EVERY DAY BEFORE BREAKFAST 08/02/21   Mauri Pole, MD  meclizine (ANTIVERT) 25 MG tablet Take 25 mg by mouth 2 (two) times daily as needed for dizziness.    [provider]  montelukast (SINGULAIR) 10 MG tablet TAKE 1 TABLET(10 MG) BY MOUTH AT BEDTIME 07/25/17   Tanda Rockers, MD  potassium chloride SA (KLOR-CON M20) 20 MEQ tablet Take 1 tablet (20 mEq total) by mouth as directed. Take 1 tablet every time you take torsemide 08/30/21 11/28/21  Adrian Prows, MD  Respiratory Therapy Supplies (FLUTTER) [place] 1 application by Does not apply route as directed.  12/27/16   Tanda Rockers, MD  rosuvastatin (CRESTOR) 20 MG tablet Take 1 tablet (20 mg total) by mouth daily. 08/01/21   Adrian Prows, MD  spironolactone (ALDACTONE) 25 MG tablet Take 1 tablet (25 mg total) by mouth every morning. 08/01/21   Adrian Prows, MD  topiramate (TOPAMAX) 25 MG tablet Take 25 mg by mouth 2 (two) times daily as needed (for migraines).     [provider]  torsemide (DEMADEX) 20 MG tablet Take 1 tablet (20 mg total) by mouth as directed. Daily for 1 week. If no leg swelling or weight gain, take every other day for 1 week. If no swelling or weight gain, can  stop and take only as needed for weight gain of 3 Lbs or leg swelling. 08/30/21   Adrian Prows, MD  traMADol (ULTRAM) 50 MG tablet Take 1 tablet (50 mg total) by mouth 3 (three) times daily as needed for moderate pain. 01/19/20   Deneise Lever, MD  TRELEGY ELLIPTA 200-62.5-25 MCG/INH AEPB Inhale 1 puff into the lungs daily. 05/23/20   [provider]  vitamin C (ASCORBIC ACID) 250 MG tablet Take 250 mg by mouth daily.    [provider]      Allergies    Sulfonamide derivatives, Doxycycline, Latex, Ciprofloxacin, Fish allergy, Hydrocortisone, and Neomycin    Review of Systems   Review of Systems  All other systems reviewed and are negative.   Physical Exam Updated Vital Signs BP (!) 140/83 (BP Location: Left Arm)   Pulse 87   Temp 98.3 F (36.8 C) (Oral)   Resp 19   Ht '5\' 3"'$  (1.6 m)   Wt 120.2 kg   SpO2 98%   BMI 46.94 kg/m  Physical Exam Vitals and nursing note reviewed.  Constitutional:      Appearance: She is well-developed.  HENT:     Head: Atraumatic.  Cardiovascular:     Rate and Rhythm: Normal rate.  Pulmonary:     Effort: Pulmonary effort is normal.  Musculoskeletal:     Cervical back: Normal range of motion and neck supple.  Skin:    General: Skin is warm and dry.  Neurological:     Mental Status: She is alert and oriented to person, place, and time.     ED Results / Procedures / Treatments   Labs (all labs ordered are listed, but only abnormal results are displayed) Labs Reviewed  BASIC METABOLIC PANEL - Abnormal; Notable for the following components:      Result Value   Potassium 3.2 (*)    All other components within normal limits  CBC WITH DIFFERENTIAL/PLATELET - Abnormal; Notable for the following components:   WBC 12.8 (*)    Neutro Abs 8.6 (*)    All other components within normal limits  D-DIMER, QUANTITATIVE - Abnormal; Notable for the following components:   D-Dimer, Quant 0.84 (*)    All other components within normal  limits  RESP PANEL BY RT-PCR (FLU A&B, COVID) [id number]  BRAIN NATRIURETIC PEPTIDE  TROPONIN I (HIGH SENSITIVITY)  TROPONIN I (HIGH SENSITIVITY)    EKG EKG Interpretation  Date/Time:  Wednesday October 24 2021 14:17:19 EDT Ventricular Rate:  74 PR Interval:  214 QRS Duration: 106 QT Interval:  398 QTC Calculation: 442 R Axis:   99 Text Interpretation: Sinus rhythm Borderline prolonged PR interval Right axis deviation Low voltage, extremity and precordial leads Probable anteroseptal infarct, old No significant change was found Confirmed by Ezequiel Essex [phone]) on 10/24/2021  5:41:19 PM  Radiology No results found.  Procedures Procedures    Medications Ordered in ED Medications  iohexol (OMNIPAQUE) 350 MG/ML injection 100 mL (100 mLs Intravenous Contrast Given 10/24/21 1600)    ED Course/ Medical Decision Making/ A&P                           Medical Decision Making Amount and/or Complexity of Data Reviewed Labs: ordered. Radiology: ordered.  Risk Prescription drug management.   61 year old female with history of long COVID-19, OSA, CKD, COPD comes in with chief complaint of nasal congestion, cough, shortness of breath.  Differential diagnosis includes URI, PE, pneumonia, viral illness.  I interpreted and ordered patient's labs.  BNP is reassuring.  High-sensitivity troponin is reassuring.  White count is normal.  Patient's D-dimer is elevated.  Patient's COVID-19 test is negative.  CT PE ordered.  Rule out PE, make sure there is no occult pneumonia.  Incoming team to follow-up on the results.  Final Clinical Impression(s) / ED Diagnoses Final diagnoses:  Atypical chest pain    Rx / DC Orders ED Discharge Orders     None         Varney Biles, MD 10/31/21 0020

## 2021-11-08 ENCOUNTER — Ambulatory Visit
Admission: RE | Admit: 2021-11-08 | Discharge: 2021-11-08 | Disposition: A | Discharge status: Home / Self Care | Payer: 59 | Source: Ambulatory Visit | Attending: Family Medicine | Admitting: Family Medicine

## 2021-11-08 DIAGNOSIS — Z1231 Encounter for screening mammogram for malignant neoplasm of breast: Secondary | ICD-10-CM

## 2021-11-19 ENCOUNTER — Telehealth: Payer: Self-pay

## 2021-11-19 NOTE — Telephone Encounter (Signed)
Patient had labs and her potassium was low. She has been taking 40 meq of potassium for 1 week potassium is still low. Also she said her weight is down to 256 lb.

## 2021-11-19 NOTE — Telephone Encounter (Signed)
So glad to see that she is losing weight.  Advised her to use torsemide only when needed for leg edema this way potassium level stays up.  We could also increase the dose of the spironolactone from 25 to 50 mg but will need monitoring of her kidney function and potassium levels in 2 to 3 weeks after increasing the dose if she wishes to do that which will normalize low potassium.  I reviewed her labs.  She could certainly follow-up with her PCP for increasing the dose of spironolactone to 50 mg daily with follow-up BMP in 2 to 3 weeks.

## 2021-11-20 ENCOUNTER — Encounter: Payer: Self-pay | Admitting: Cardiology

## 2021-11-20 NOTE — Telephone Encounter (Signed)
If she goes up on aldactone, no. If she does want to increase aldactone, I will place BMP orders to be done in 2-3 weeks after start

## 2021-11-20 NOTE — Telephone Encounter (Signed)
Patient is aware 

## 2021-11-20 NOTE — Telephone Encounter (Signed)
Can you call her?

## 2021-11-20 NOTE — Telephone Encounter (Signed)
Should she continue potassium daily?

## 2021-11-30 ENCOUNTER — Ambulatory Visit: Payer: 59 | Admitting: Cardiology

## 2021-12-04 ENCOUNTER — Encounter: Payer: Self-pay | Admitting: Gastroenterology

## 2021-12-10 ENCOUNTER — Telehealth: Payer: Self-pay | Admitting: Gastroenterology

## 2021-12-10 NOTE — Telephone Encounter (Signed)
Patient is calling in regards to her hospital surgery appt 12/18 Please advise

## 2021-12-10 NOTE — Telephone Encounter (Signed)
Patient called in stating she would like for procedure date to be moved to a sooner date because she needs refills for albuterol & trelegy with her pulmonologist, and he is requiring an OV prior to 01/05/22. His office is located in [place]. Patient has been made aware that Dr. Woodward Ku nurse is out of the office this week & will contact her next week when she returns to see if there is a sooner time available, however hospital dates are very limited. She states she was offered a date this week originally, but I made her aware that Dr. Silverio Decamp is off this week and is not performing procedures.

## 2021-12-11 LAB — BASIC METABOLIC PANEL
BUN/Creatinine Ratio: 14 (ref 12–28)
BUN: 13 mg/dL (ref 8–27)
CO2: 18 mmol/L — ABNORMAL LOW (ref 20–29)
Calcium: 9.2 mg/dL (ref 8.7–10.3)
Chloride: 108 mmol/L — ABNORMAL HIGH (ref 96–106)
Creatinine, Ser: 0.91 mg/dL (ref 0.57–1.00)
Glucose: 90 mg/dL (ref 70–99)
Potassium: 4.5 mmol/L (ref 3.5–5.2)
Sodium: 139 mmol/L (ref 134–144)
eGFR: 71 mL/min/{1.73_m2} (ref >59)

## 2021-12-15 ENCOUNTER — Other Ambulatory Visit: Payer: Self-pay | Admitting: Cardiology

## 2021-12-15 DIAGNOSIS — I5032 Chronic diastolic (congestive) heart failure: Secondary | ICD-10-CM

## 2021-12-17 ENCOUNTER — Encounter: Payer: Self-pay | Admitting: Cardiology

## 2021-12-17 ENCOUNTER — Ambulatory Visit: Payer: 59 | Admitting: Cardiology

## 2021-12-17 VITALS — BP 139/69 | HR 63 | Resp 16 | Ht 63.0 in | Wt 247.0 lb

## 2021-12-17 DIAGNOSIS — I10 Essential (primary) hypertension: Secondary | ICD-10-CM

## 2021-12-17 DIAGNOSIS — I5032 Chronic diastolic (congestive) heart failure: Secondary | ICD-10-CM

## 2021-12-17 NOTE — Patient Instructions (Signed)
You can stop the isosorbide dinitrate as you have been losing significant amount of weight.  Your blood pressure is also well-controlled.   I am very proud of you.  Your body mass index has come down from >50 to the present 43.75.  This is a big feat.  Please continue to lose more weight.  Please see me at any point if you feel you need to be seen.  Ask your doctors to continue present medications and asked them for refills as well.  With addition of spironolactone, your potassium levels are now well-maintained with 2 additional pills of potassium supplements.  Continue to hold diuretics as you do not have any evidence of heart failure.

## 2021-12-17 NOTE — Telephone Encounter (Signed)
Patient called again stating that she had never heard back from our office in regards to moving up her procedures. Patient is requesting a call back to discuss. Please advise.

## 2021-12-17 NOTE — Progress Notes (Signed)
Primary Physician/Referring:  Bartholome Bill, MD  Patient ID: [name], female    DOB: 06/28/1960, 61 y.o.   MRN: [id number]  Chief Complaint  Patient presents with   Chronic diastolic heart failure   Hypertension   Obesity   Follow-up    3 months   HPI:    [name]  is a 61 y.o. African-American female patient with chronic diastolic heart failure, normal coronary arteries by remote catheterization and also by coronary CTA in September 2022, primary hypertension, chronic hypokalemia, stage IIIa chronic kidney disease, hyperglycemia, hyperlipidemia, morbid obesity, OSA on BiPAP, previously evaluated by multiple cardiologists at [facility], follows Dr. Silverio Decamp for IBS and GERD, Dr. Madelon Lips for management of renal disease presents for follow-up of heart failure, hypertension management.  She is presently doing well and continues to lose weight and has been extremely careful with her diet, has been compliant with medications as well.  No specific complaints today, dyspnea has remained stable, no leg edema, no PND or orthopnea.  Past Medical History:  Diagnosis Date   Acute on chronic diastolic CHF (congestive heart failure) ([place]) 04/11/2014   Acute suppurative otitis media without spontaneous rupture of eardrum 02/04/2007   Centricity Description: OTITIS MEDIA, SUPPURATIVE, ACUTE, BILATERAL Qualifier: Diagnosis of  By: [name] MD, [name]  Centricity Description: OTITIS MEDIA, SUPPURATIVE, ACUTE Qualifier: Diagnosis of  By: [name] MD, Sean A    Allergic rhinitis    Allergy    Anemia    Anxiety    Arthritis    Atherosclerosis of aorta ([place]) 02/21/2021   CAD (coronary artery disease)    LHC (11/29/1999):  EF 60%; no significant CAD.   Carpal tunnel syndrome    Carpal tunnel syndrome 06/09/2006   Qualifier: Diagnosis of  By: Marca Ancona RMA, Lucy     Chronic diastolic CHF (congestive heart failure) ([place])    a. Echo (01/21/11): Vigorous LVF, EF 65-70%, no RWMA, Gr 2 DD. b.   Echo (12/15): Moderate LVH, EF 63-89%, grade 2 diastolic dysfunction, mild LAE, normal RV function c. 01/2015: echo showing EF of 60-65% with mild LVH   Common migraine    COPD (chronic obstructive pulmonary disease) (HCC)    [id number]    x 2   Diastolic heart failure (HCC)    Diverticulitis    Dyspnea    Hepatic steatosis    Hepatomegaly    Hiatal hernia    Hyperlipidemia    IBS (irritable bowel syndrome)    Internal hemorrhoids    Long COVID    Metabolic syndrome    Morbid obesity ([place])    PNEUMONIA, ORGAN UNSPECIFIED 03/01/2009   Qualifier: Diagnosis of  By: [name] CMA, Jennifer     Sinusitis, chronic 03/10/2012   CT sinuses 02/2012:  Chronic sinusitis with small A/F levels >> rx by ENT with prolonged augmentin F/u ct sinuses 04/2012:  Persistent sinus thickening >> rx with levaquin and clinda for 30 days.  Told to return for sinus scan in 6 weeks >> no showed for followup visit.  CT sinuses 11/2013:  Acute and chronic sinusitis noted.     Social History   Tobacco Use   Smoking status: Never   Smokeless tobacco: Never  Substance Use Topics   Alcohol use: No   Marital Status: Widowed  ROS  Review of Systems  Cardiovascular:  Positive for dyspnea on exertion. Negative for chest pain and leg swelling.   Objective  Blood pressure 139/69, pulse 63, resp. rate 16, height  _0  (1.6 m), weight 247 lb (112 kg), SpO2 95 %. Body mass index is 43.75 kg/m.     12/17/2021    3:35 PM 10/31/2021    2:52 PM 10/24/2021    5:07 PM  Vitals with BMI  Height _1  _2    Weight 247 lbs 263 lbs   BMI 21.19 41.7   Systolic 408 144 818  Diastolic 69 82 83  Pulse 63 65 87    Physical Exam Constitutional:      Comments: Morbidly obese in no acute distress.  Neck:     Vascular: No carotid bruit or JVD.  Cardiovascular:     Rate and Rhythm: Normal rate and regular rhythm.     Pulses: Intact distal pulses.     Heart sounds: Normal heart sounds. No murmur heard.    No gallop.   Pulmonary:     Effort: Pulmonary effort is normal.     Breath sounds: Normal breath sounds.  Abdominal:     General: Bowel sounds are normal.     Palpations: Abdomen is soft.     Comments: Obese. Pannus present  Musculoskeletal:     Right lower leg: No edema.     Left lower leg: No edema.    Medications and allergies   Allergies  Allergen Reactions   Sulfonamide Derivatives Anaphylaxis, Swelling, Rash and Other (See Comments)    Swelling tongue and ear    Doxycycline Nausea And Vomiting   Latex Hives   Ciprofloxacin Rash   Fish Allergy Rash and Other (See Comments)    Only reaction to Mackerel.  No other issues with any type of fish or shellfish currently    Hydrocortisone Rash   Neomycin Rash     Medication list after today's encounter   Current Outpatient Medications:    acetaminophen (TYLENOL) 650 MG CR tablet, Take 650 mg by mouth every 8 (eight) hours as needed for pain (headache)., Disp: , Rfl:    Azelastine-Fluticasone 137-50 MCG/ACT SUSP, Place 137 mcg into the nose in the morning, at noon, in the evening, and at bedtime., Disp: , Rfl:    benzonatate (TESSALON) 200 MG capsule, Take 200 mg by mouth 3 (three) times daily., Disp: , Rfl:    carvedilol (COREG) 12.5 MG tablet, Take 0.5 tablets (6.25 mg total) by mouth daily., Disp: 180 tablet, Rfl: 2   dexlansoprazole (DEXILANT) 60 MG capsule, TAKE 1 CAPSULE (60 MG TOTAL) BY MOUTH DAILY. DAILY IN THE MORNING, Disp: 90 capsule, Rfl: 3   dicyclomine (BENTYL) 10 MG capsule, Take 1 capsule (10 mg total) by mouth every 8 (eight) hours as needed for spasms., Disp: 60 capsule, Rfl: 1   EPINEPHrine 0.3 mg/0.3 mL IJ SOAJ injection, Inject 0.3 mg into the muscle as needed for anaphylaxis., Disp: , Rfl:    ergocalciferol (VITAMIN D2) 1.25 MG (50000 UT) capsule, Take 50,000 Units by mouth once a week. Fridays, Disp: , Rfl:    famotidine (PEPCID) 20 MG tablet, TAKE 1 TABLET BY MOUTH DAILY AFTER SUPPER, Disp: 90 tablet, Rfl: 3   FARXIGA  10 MG TABS tablet, Take 1 tablet (10 mg total) by mouth daily., Disp: 90 tablet, Rfl: 1   fluticasone (FLONASE) 50 MCG/ACT nasal spray, Place 1 spray into the nose in the morning, at noon, and at bedtime., Disp: , Rfl:    ipratropium (ATROVENT) 0.06 % nasal spray, Place 2 sprays into both nostrils 3 (three) times daily., Disp: , Rfl:    ipratropium-albuterol (DUONEB) 0.5-2.5 (  3) MG/3ML SOLN, Take 3 mLs by nebulization in the morning, at noon, in the evening, and at bedtime., Disp: , Rfl:    isosorbide dinitrate (ISORDIL) 20 MG tablet, Take 1 tablet (20 mg total) by mouth 2 (two) times daily., Disp: 270 tablet, Rfl: 3   levocetirizine (XYZAL) 5 MG tablet, Take 5 mg by mouth every evening., Disp: , Rfl:    linaclotide (LINZESS) 290 MCG CAPS capsule, TAKE 1 CAPSULE BY MOUTH EVERY DAY BEFORE BREAKFAST, Disp: 30 capsule, Rfl: 6   meclizine (ANTIVERT) 25 MG tablet, Take 25 mg by mouth 2 (two) times daily as needed for dizziness., Disp: , Rfl:    montelukast (SINGULAIR) 10 MG tablet, TAKE 1 TABLET(10 MG) BY MOUTH AT BEDTIME, Disp: 30 tablet, Rfl: 1   potassium chloride SA (KLOR-CON M20) 20 MEQ tablet, Take 1 tablet (20 mEq total) by mouth as directed. Take 1 tablet every time you take torsemide, Disp: 90 tablet, Rfl: 3   Respiratory Therapy Supplies (FLUTTER) DEVI, 1 application by Does not apply route as directed., Disp: 1 each, Rfl: 0   rosuvastatin (CRESTOR) 20 MG tablet, Take 1 tablet (20 mg total) by mouth daily., Disp: 90 tablet, Rfl: 3   spironolactone (ALDACTONE) 25 MG tablet, Take 1 tablet (25 mg total) by mouth every morning., Disp: 90 tablet, Rfl: 1   sucralfate (CARAFATE) 1 g tablet, Take 1 tablet (1 g total) by mouth 2 (two) times daily., Disp: 60 tablet, Rfl: 1   topiramate (TOPAMAX) 25 MG tablet, Take 25 mg by mouth 2 (two) times daily as needed (for migraines). , Disp: , Rfl:    traMADol (ULTRAM) 50 MG tablet, Take 1 tablet (50 mg total) by mouth 3 (three) times daily as needed for moderate  pain., Disp: 90 tablet, Rfl: 1   TRELEGY ELLIPTA 200-62.5-25 MCG/INH AEPB, Inhale 1 puff into the lungs daily., Disp: , Rfl:    vitamin C (ASCORBIC ACID) 250 MG tablet, Take 250 mg by mouth daily., Disp: , Rfl:   Laboratory examination:   Recent Labs    04/16/21 1258 06/08/21 0820 06/14/21 1250 08/27/21 1139 10/24/21 1430 12/10/21 1042  NA 137 134*   < > 142 141 139  K 3.3* 2.9*   < > 3.7 3.2* 4.5  CL 95* 86*   < > 105 107 108*  CO2 30 31   < > 22 25 18*  GLUCOSE 120* 142*   < > 109* 99 90  BUN 25* 35*   < > _0 CREATININE 1.15* 1.85*   < > 1.18* 0.91 0.91  CALCIUM 10.3 10.1   < > 9.5 9.5 9.2  GFRNONAA 54* 30*  --   --  >60  --    < > = values in this interval not displayed.       Latest Ref Rng & Units 12/10/2021   10:42 AM 10/24/2021    2:30 PM 08/27/2021   11:39 AM  CMP  Glucose 70 - 99 mg/dL 90  99  109   BUN 8 - 27 mg/dL _1 Creatinine 0.57 - 1.00 mg/dL 0.91  0.91  1.18   Sodium 134 - 144 mmol/L 139  141  142   Potassium 3.5 - 5.2 mmol/L 4.5  3.2  3.7   Chloride 96 - 106 mmol/L 108  107  105   CO2 20 - 29 mmol/L _2 Calcium 8.7 - 10.3 mg/dL 9.2  9.5  9.5       Latest Ref Rng & Units 10/24/2021    2:30 PM 06/08/2021    8:20 AM 04/16/2021   12:58 PM  CBC  WBC 4.0 - 10.5 K/uL 12.8  11.7  11.5   Hemoglobin 12.0 - 15.0 g/dL 12.5  14.4  12.4   Hematocrit 36.0 - 46.0 % 39.3  43.4  37.7   Platelets 150 - 400 K/uL 253  309  268    Lab Results  Component Value Date   CHOL 120 05/23/2020   HDL 49 05/23/2020   LDLCALC 54 05/23/2020   TRIG 89 05/23/2020   CHOLHDL 2.4 05/23/2020    HEMOGLOBIN A1C Lab Results  Component Value Date   HGBA1C 5.8 (H) 02/19/2015   MPG 120 02/19/2015   TSH Recent Labs    06/14/21 1243  TSH 2.010    BNP (last 3 results) Recent Labs    03/05/21 2031 10/24/21 1430  BNP 25.7 32.9    ProBNP (last 3 results) Recent Labs    06/14/21 1250 07/23/21 1312  PROBNP 284 239   Radiology:    Cardiac  Studies:   Extended outpatient telemetry 11/06/2016: Predominant rhythm is normal sinus rhythm.  Average heart rate is 87 bpm. Patient symptoms of palpitations correlated with sinus tachycardia at 120 bpm.  Occasional PACs.  Lexiscan nuclear stress test 04/01/2017: LVEF 77%, no wall motion abnormality.  Increased wall thickness. No evidence of ischemia.  Low risk study.  Echocardiogram 03/23/2019:  1. Left ventricular ejection fraction, by estimation, is >75%. The left ventricle has hyperdynamic function. The left ventricle has no regional wall motion abnormalities. There is moderate left ventricular hypertrophy. Left ventricular diastolic  parameters are indeterminate.  2. Right ventricular systolic function was not well visualized. The right ventricular size is not well visualized.  3. The mitral valve was not well visualized. No evidence of mitral valve regurgitation.  4. The aortic valve was not well visualized. Aortic valve regurgitation is not visualized.  5. The inferior vena cava IVC measures <1.2 cm and spontaenously collapses, suggesting volume depletion and a low RA pressure <3 mmHg.  Coronary CTA 11/16/2020: Coronary calcium score of 0. Normal coronary origin with right dominance. Normal coronary arteries.  EKG:   EKG 06/14/2021: Normal sinus rhythm at rate of 63 bpm, normal axis, poor R wave progression, cannot exclude anteroseptal infarct old.  Low-voltage complexes.    Assessment     ICD-10-CM   1. Chronic diastolic heart failure (HCC)  I50.32     2. Primary hypertension  I10       Medications Discontinued During This Encounter  Medication Reason   torsemide (DEMADEX) 20 MG tablet    Na Sulfate-K Sulfate-Mg Sulf (SUPREP BOWEL PREP KIT) 17.5-3.13-1.6 GM/177ML SOLN Completed Course    No orders of the defined types were placed in this encounter.  No orders of the defined types were placed in this encounter.  Recommendations:   [name] is a 61 y.o.   African-American female patient with chronic diastolic heart failure, normal coronary arteries by remote catheterization and also by coronary CTA in September 2022, primary hypertension, chronic hypokalemia, stage IIIa chronic kidney disease, hyperglycemia, hyperlipidemia, morbid obesity, OSA on BiPAP, previously evaluated by multiple cardiologists at [facility], follows Dr. Silverio Decamp for IBS and GERD, Dr. Madelon Lips for management of renal disease presents for follow-up of heart failure, hypertension management.  1. Chronic diastolic heart failure ([place]) She has lost significant amount of weight, approximately  her BMI is to be >50 and presently 43.  With this she has not had any clinical evidence of heart failure, no leg edema.  2. Primary hypertension Blood pressure is under excellent control, advised in view of weight loss, she could also discontinue isosorbide dinitrate and take it only if the blood pressure continues to rise beyond 130/80 mmHg. She is tolerating spironolactone and potassium levels have stabilized as well.  As she is stable from cardiac standpoint, I will see her back on a as needed basis.  I filled her handicap sticker for 6 months due to arthritis.  Hopefully she will not need this anymore when she loses more weight.   Adrian Prows, MD, [facility] 12/17/2021, 3:58 PM Office: [phone]

## 2021-12-17 NOTE — Telephone Encounter (Signed)
Patient called to follow up on previous call\message said she "would like to discuss what is going on".

## 2021-12-18 NOTE — Telephone Encounter (Signed)
Inbound call from patient stating she needs to have her hospital procedure moved up to a different date and time.   Advised patient I would make the nurse aware of the changes. Patient then states that she does not want to be placed on a wait list due to her going out of town to [place].  Patient then states she will call and speak with her heart doctor and then give [place] a call back to reschedule apt if needed.  I advised nurse not to cancel just yet. Patient states she will call back before 3 pm to let us know if she needs to be scheduled for the end of January 2024.   Please advise

## 2021-12-18 NOTE — Telephone Encounter (Signed)
Patient will keep upcoming apt at [facility]

## 2021-12-31 ENCOUNTER — Encounter (HOSPITAL_COMMUNITY): Payer: Self-pay | Admitting: Gastroenterology

## 2021-12-31 NOTE — Progress Notes (Signed)
Attempted to obtain medical history via telephone, unable to reach at this time. HIPAA compliant voicemail message left requesting return call to pre surgical testing department. 

## 2022-01-04 ENCOUNTER — Other Ambulatory Visit: Payer: Self-pay | Admitting: Gastroenterology

## 2022-01-06 NOTE — Anesthesia Preprocedure Evaluation (Signed)
Anesthesia Evaluation  Patient identified by MRN, date of birth, ID band Patient awake    Reviewed: Allergy & Precautions, NPO status , Patient's Chart, lab work & pertinent test results  Airway Mallampati: II  TM Distance: >3 FB Neck ROM: Full    Dental no notable dental hx.    Pulmonary asthma , sleep apnea , COPD   Pulmonary exam normal        Cardiovascular hypertension, + CAD and +CHF   Rhythm:Regular Rate:Normal     Neuro/Psych  Headaches  Anxiety        GI/Hepatic Neg liver ROS, hiatal hernia,GERD  Medicated,,polyps   Endo/Other    Morbid obesity  Renal/GU   negative genitourinary   Musculoskeletal  (+) Arthritis ,    Abdominal Normal abdominal exam  (+)   Peds  Hematology  (+) Blood dyscrasia, anemia   Anesthesia Other Findings   Reproductive/Obstetrics                             Anesthesia Physical Anesthesia Plan  ASA: 3  Anesthesia Plan: MAC   Post-op Pain Management:    Induction: Intravenous  PONV Risk Score and Plan: 2 and Propofol infusion and Treatment may vary due to age or medical condition  Airway Management Planned: Simple Face Mask, Natural Airway and Nasal Cannula  Additional Equipment: None  Intra-op Plan:   Post-operative Plan:   Informed Consent: I have reviewed the patients History and Physical, chart, labs and discussed the procedure including the risks, benefits and alternatives for the proposed anesthesia with the patient or authorized representative who has indicated his/her understanding and acceptance.     Dental advisory given  Plan Discussed with: CRNA  Anesthesia Plan Comments:        Anesthesia Quick Evaluation

## 2022-01-07 ENCOUNTER — Ambulatory Visit (HOSPITAL_BASED_OUTPATIENT_CLINIC_OR_DEPARTMENT_OTHER): Payer: 59 | Admitting: Anesthesiology

## 2022-01-07 ENCOUNTER — Ambulatory Visit (HOSPITAL_COMMUNITY): Payer: 59 | Admitting: Anesthesiology

## 2022-01-07 ENCOUNTER — Encounter (HOSPITAL_COMMUNITY)
Admission: RE | Disposition: A | Discharge status: Home / Self Care | Payer: Self-pay | Source: Home / Self Care | Attending: Gastroenterology

## 2022-01-07 ENCOUNTER — Encounter (HOSPITAL_COMMUNITY): Payer: Self-pay | Admitting: Gastroenterology

## 2022-01-07 ENCOUNTER — Ambulatory Visit (HOSPITAL_COMMUNITY)
Admission: RE | Admit: 2022-01-07 | Discharge: 2022-01-07 | Disposition: A | Discharge status: Home / Self Care | Payer: 59 | Attending: Gastroenterology | Admitting: Gastroenterology

## 2022-01-07 DIAGNOSIS — K449 Diaphragmatic hernia without obstruction or gangrene: Secondary | ICD-10-CM

## 2022-01-07 DIAGNOSIS — G4733 Obstructive sleep apnea (adult) (pediatric): Secondary | ICD-10-CM | POA: Diagnosis not present

## 2022-01-07 DIAGNOSIS — K648 Other hemorrhoids: Secondary | ICD-10-CM | POA: Insufficient documentation

## 2022-01-07 DIAGNOSIS — K3189 Other diseases of stomach and duodenum: Secondary | ICD-10-CM | POA: Diagnosis not present

## 2022-01-07 DIAGNOSIS — R1013 Epigastric pain: Secondary | ICD-10-CM | POA: Diagnosis not present

## 2022-01-07 DIAGNOSIS — D125 Benign neoplasm of sigmoid colon: Secondary | ICD-10-CM

## 2022-01-07 DIAGNOSIS — M1990 Unspecified osteoarthritis, unspecified site: Secondary | ICD-10-CM | POA: Diagnosis not present

## 2022-01-07 DIAGNOSIS — K635 Polyp of colon: Secondary | ICD-10-CM | POA: Insufficient documentation

## 2022-01-07 DIAGNOSIS — I2510 Atherosclerotic heart disease of native coronary artery without angina pectoris: Secondary | ICD-10-CM | POA: Insufficient documentation

## 2022-01-07 DIAGNOSIS — K2100 Gastro-esophageal reflux disease with esophagitis, without bleeding: Secondary | ICD-10-CM

## 2022-01-07 DIAGNOSIS — Z6841 Body Mass Index (BMI) 40.0 and over, adult: Secondary | ICD-10-CM | POA: Diagnosis not present

## 2022-01-07 DIAGNOSIS — D759 Disease of blood and blood-forming organs, unspecified: Secondary | ICD-10-CM | POA: Diagnosis not present

## 2022-01-07 DIAGNOSIS — R519 Headache, unspecified: Secondary | ICD-10-CM | POA: Diagnosis not present

## 2022-01-07 DIAGNOSIS — J449 Chronic obstructive pulmonary disease, unspecified: Secondary | ICD-10-CM | POA: Diagnosis not present

## 2022-01-07 DIAGNOSIS — K259 Gastric ulcer, unspecified as acute or chronic, without hemorrhage or perforation: Secondary | ICD-10-CM

## 2022-01-07 DIAGNOSIS — K5900 Constipation, unspecified: Secondary | ICD-10-CM | POA: Diagnosis not present

## 2022-01-07 DIAGNOSIS — K219 Gastro-esophageal reflux disease without esophagitis: Secondary | ICD-10-CM

## 2022-01-07 DIAGNOSIS — D649 Anemia, unspecified: Secondary | ICD-10-CM | POA: Insufficient documentation

## 2022-01-07 DIAGNOSIS — I110 Hypertensive heart disease with heart failure: Secondary | ICD-10-CM | POA: Insufficient documentation

## 2022-01-07 DIAGNOSIS — K644 Residual hemorrhoidal skin tags: Secondary | ICD-10-CM | POA: Insufficient documentation

## 2022-01-07 DIAGNOSIS — Z860100 Personal history of colon polyps, unspecified: Secondary | ICD-10-CM

## 2022-01-07 DIAGNOSIS — I5032 Chronic diastolic (congestive) heart failure: Secondary | ICD-10-CM | POA: Insufficient documentation

## 2022-01-07 DIAGNOSIS — E785 Hyperlipidemia, unspecified: Secondary | ICD-10-CM | POA: Diagnosis not present

## 2022-01-07 DIAGNOSIS — Z09 Encounter for follow-up examination after completed treatment for conditions other than malignant neoplasm: Secondary | ICD-10-CM | POA: Diagnosis not present

## 2022-01-07 HISTORY — PX: COLONOSCOPY WITH PROPOFOL: SHX5780

## 2022-01-07 HISTORY — PX: ESOPHAGOGASTRODUODENOSCOPY (EGD) WITH PROPOFOL: SHX5813

## 2022-01-07 HISTORY — PX: POLYPECTOMY: SHX5525

## 2022-01-07 SURGERY — COLONOSCOPY WITH PROPOFOL
Anesthesia: Monitor Anesthesia Care

## 2022-01-07 MED ORDER — PROPOFOL 500 MG/50ML IV EMUL
INTRAVENOUS | Status: DC | PRN
  Administered 2022-01-07: 60 mg via INTRAVENOUS
  Administered 2022-01-07: 100 ug/kg/min via INTRAVENOUS

## 2022-01-07 MED ORDER — PROPOFOL 500 MG/50ML IV EMUL
INTRAVENOUS | Status: AC
  Filled 2022-01-07: qty 50

## 2022-01-07 MED ORDER — PROPOFOL 1000 MG/100ML IV EMUL
INTRAVENOUS | Status: AC
  Filled 2022-01-07: qty 200

## 2022-01-07 MED ORDER — LACTATED RINGERS IV SOLN: INTRAVENOUS | Status: DC

## 2022-01-07 MED ORDER — DEXMEDETOMIDINE HCL IN NACL 80 MCG/20ML IV SOLN
INTRAVENOUS | Status: AC
  Filled 2022-01-07: qty 20

## 2022-01-07 MED ORDER — SODIUM CHLORIDE 0.9 % IV SOLN: INTRAVENOUS | Status: DC

## 2022-01-07 SURGICAL SUPPLY — 25 items

## 2022-01-07 NOTE — Op Note (Signed)
[facility] Patient Name: [name] Procedure Date: 01/07/2022 MRN: [id number] Attending MD: Mauri Pole , MD, [id number] Date of Birth: 1960/02/22 CSN: [id number] Age: 61 Admit Type: Outpatient Procedure:                Upper GI endoscopy Indications:              Epigastric abdominal pain, Follow-up of gastric                            ulcer Providers:                Mauri Pole, MD, Mikey College, RN, Cherylynn Ridges, Technician Referring MD:              Medicines:                Monitored Anesthesia Care Complications:            No immediate complications. Estimated Blood Loss:     Estimated blood loss was minimal. Procedure:                Pre-Anesthesia Assessment:                           - Prior to the procedure, a History and Physical                            was performed, and patient medications and                            allergies were reviewed. The patient's tolerance of                            previous anesthesia was also reviewed. The risks                            and benefits of the procedure and the sedation                            options and risks were discussed with the patient.                            All questions were answered, and informed consent                            was obtained. Prior Anticoagulants: The patient has                            taken no anticoagulant or antiplatelet agents. ASA                            Grade Assessment: III - A patient with severe  systemic disease. After reviewing the risks and                            benefits, the patient was deemed in satisfactory                            condition to undergo the procedure.                           After obtaining informed consent, the endoscope was                            passed under direct vision. Throughout the                            procedure, the  patient's blood pressure, pulse, and                            oxygen saturations were monitored continuously. The                            [id number]-[id number] ([id number] Olympus endoscope was introduced                            through the mouth, and advanced to the second part                            of duodenum. The upper GI endoscopy was                            accomplished without difficulty. The patient                            tolerated the procedure well. Scope In: Scope Out: Findings:      The Z-line was regular and was found 38 cm from the incisors.      LA Grade B (one or more mucosal breaks greater than 5 mm, not extending       between the tops of two mucosal folds) esophagitis with no bleeding was       found 37 to 38 cm from the incisors.      A 4 cm hiatal hernia was present.      A few dispersed less than 5 mm erosions with no bleeding and no stigmata       of recent bleeding were found in the gastric body, at the incisura and       in the gastric antrum.      The cardia and gastric fundus were normal on retroflexion.      The examined duodenum was normal. Impression:               - Z-line regular, 38 cm from the incisors.                           - LA Grade B reflux esophagitis with no bleeding.                           -  4 cm hiatal hernia.                           - Erosive gastropathy with no bleeding and no                            stigmata of recent bleeding.                           - Normal examined duodenum.                           - No specimens collected. Moderate Sedation:      N/A Recommendation:           - Patient has a contact number available for                            emergencies. The signs and symptoms of potential                            delayed complications were discussed with the                            patient. Return to normal activities tomorrow.                            Written discharge instructions were provided to  the                            patient.                           - Resume previous diet.                           - Continue present medications.                           - Follow an antireflux regimen.                           - Return to GI office at the next available                            appointment in 2 months. Procedure Code(s):        --- Professional ---                           [id number], Esophagogastroduodenoscopy, flexible,                            transoral; diagnostic, including collection of                            specimen(s) by brushing or washing, when performed                            (  separate procedure) Diagnosis Code(s):        --- Professional ---                           K21.00, Gastro-esophageal reflux disease with                            esophagitis, without bleeding                           K44.9, Diaphragmatic hernia without obstruction or                            gangrene                           K31.89, Other diseases of stomach and duodenum                           R10.13, Epigastric pain                           K25.9, Gastric ulcer, unspecified as acute or                            chronic, without hemorrhage or perforation CPT copyright 2022 American [employer] Association. All rights reserved. The codes documented in this report are preliminary and upon coder review may  be revised to meet current compliance requirements. Mauri Pole, MD 01/07/2022 8:33:05 AM This report has been signed electronically. Number of Addenda: 0

## 2022-01-07 NOTE — Anesthesia Postprocedure Evaluation (Signed)
Anesthesia Post Note  Patient: [name]  Procedure(s) Performed: COLONOSCOPY WITH PROPOFOL ESOPHAGOGASTRODUODENOSCOPY (EGD) WITH PROPOFOL POLYPECTOMY     Patient location during evaluation: PACU Anesthesia Type: MAC Level of consciousness: awake and alert Pain management: pain level controlled Vital Signs Assessment: post-procedure vital signs reviewed and stable Respiratory status: spontaneous breathing, nonlabored ventilation, respiratory function stable and patient connected to nasal cannula oxygen Cardiovascular status: stable and blood pressure returned to baseline Postop Assessment: no apparent nausea or vomiting Anesthetic complications: no   No notable events documented.  Last Vitals:  Vitals:   01/07/22 0830 01/07/22 0840  BP: 133/76 (!) 149/89  Pulse: 75 88  Resp: 15 (!) 22  Temp:    SpO2: 98% 99%    Last Pain:  Vitals:   01/07/22 0840  TempSrc:   PainSc: 0-No pain                 Belenda Cruise P Jurnie Garritano

## 2022-01-07 NOTE — Discharge Instructions (Signed)
YOU HAD AN ENDOSCOPIC PROCEDURE TODAY: Refer to the procedure report and other information in the discharge instructions given to you for any specific questions about what was found during the examination. If this information does not answer your questions, please call [place] office at [phone] to clarify.  ° °YOU SHOULD EXPECT: Some feelings of bloating in the abdomen. Passage of more gas than usual. Walking can help get rid of the air that was put into your GI tract during the procedure and reduce the bloating. If you had a lower endoscopy (such as a colonoscopy or flexible sigmoidoscopy) you may notice spotting of blood in your stool or on the toilet paper. Some abdominal soreness may be present for a day or two, also. ° °DIET: Your first meal following the procedure should be a light meal and then it is ok to progress to your normal diet. A half-sandwich or bowl of soup is an example of a good first meal. Heavy or fried foods are harder to digest and may make you feel nauseous or bloated. Drink plenty of fluids but you should avoid alcoholic beverages for 24 hours.  ° °ACTIVITY: Your care partner should take you home directly after the procedure. You should plan to take it easy, moving slowly for the rest of the day. You can resume normal activity the day after the procedure however YOU SHOULD NOT DRIVE, use power tools, machinery or perform tasks that involve climbing or major physical exertion for 24 hours (because of the sedation medicines used during the test).  ° °SYMPTOMS TO REPORT IMMEDIATELY: °A gastroenterologist can be reached at any hour. Please call [phone]  for any of the following symptoms:  °Following lower endoscopy (colonoscopy, flexible sigmoidoscopy) °Excessive amounts of blood in the stool  °Significant tenderness, worsening of abdominal pains  °Swelling of the abdomen that is new, acute  °Fever of 100° or higher  °Following upper endoscopy (EGD, EUS, ERCP, esophageal  dilation) °Vomiting of blood or coffee ground material  °New, significant abdominal pain  °New, significant chest pain or pain under the shoulder blades  °Painful or persistently difficult swallowing  °New shortness of breath  °Black, tarry-looking or red, bloody stools ° °FOLLOW UP:  °If any biopsies were taken you will be contacted by phone or by letter within the next 1-3 weeks. Call [phone]  if you have not heard about the biopsies in 3 weeks.  °Please also call with any specific questions about appointments or follow up tests.  °

## 2022-01-07 NOTE — Transfer of Care (Signed)
Immediate Anesthesia Transfer of Care Note  Patient: [name]  Procedure(s) Performed: Procedure(s): COLONOSCOPY WITH PROPOFOL (N/A) ESOPHAGOGASTRODUODENOSCOPY (EGD) WITH PROPOFOL (N/A) POLYPECTOMY  Patient Location: PACU and Endoscopy Unit  Anesthesia Type:MAC  Level of Consciousness: awake, alert  and oriented  Airway & Oxygen Therapy: Patient Spontanous Breathing and Patient connected to nasal cannula oxygen  Post-op Assessment: Report given to RN and Post -op Vital signs reviewed and stable  Post vital signs: Reviewed and stable  Last Vitals:  Vitals:   01/07/22 0651  BP: 122/66  Pulse: 72  Resp: 20  Temp: 36.6 C  SpO2: 51%    Complications: No apparent anesthesia complications

## 2022-01-07 NOTE — H&P (Signed)
[facility] History and Physical   Primary Care Physician:  Bartholome Bill, MD   Reason for Procedure:   Gastric ulcers, epigastric pain, colon polyps  Plan:    EGD and colonoscopy with possible interventions     HPI: [name] is a 61 y.o. female  very pleasant with morbid obesity, hypertension, hyperlipidemia, COPD, OSA, diastolic dysfunction,  here for follow-up for chronic GERD and constipation  She is experiencing generalized abdominal bloating, discomfort and excess gas.  She also has intermittent epigastric abdominal pain   Last EGD with limited is evaluation of gastric mucosa due to phytobezoar, has history of multiple gastric ulcers and due for surveillance colonoscopy, had a large hamartomatous polyp removed from sigmoid colon Schedule EGD and colonoscopy at [facility] The risks and benefits as well as alternatives of endoscopic procedure(s) have been discussed and reviewed. All questions answered. The patient agrees to proceed.   Past Medical History:  Diagnosis Date   Acute on chronic diastolic CHF (congestive heart failure) ([place]) 04/11/2014   Acute suppurative otitis media without spontaneous rupture of eardrum 02/04/2007   Centricity Description: OTITIS MEDIA, SUPPURATIVE, ACUTE, BILATERAL Qualifier: Diagnosis of  By: [name] MD, [name]  Centricity Description: OTITIS MEDIA, SUPPURATIVE, ACUTE Qualifier: Diagnosis of  By: [name] MD, Sean A    Allergic rhinitis    Allergy    Anemia    Anxiety    Arthritis    Atherosclerosis of aorta ([place]) 02/21/2021   CAD (coronary artery disease)    LHC (11/29/1999):  EF 60%; no significant CAD.   Carpal tunnel syndrome    Carpal tunnel syndrome 06/09/2006   Qualifier: Diagnosis of  By: Marca Ancona RMA, Lucy     Chronic diastolic CHF (congestive heart failure) ([place])    a. Echo (01/21/11): Vigorous LVF, EF 65-70%, no RWMA, Gr 2 DD. b.  Echo (12/15): Moderate LVH, EF 16-38%, grade 2 diastolic  dysfunction, mild LAE, normal RV function c. 01/2015: echo showing EF of 60-65% with mild LVH   Common migraine    COPD (chronic obstructive pulmonary disease) ([facility])    [id number]    x 2   Diastolic heart failure ([facility])    Diverticulitis    Dyspnea    Hepatic steatosis    Hepatomegaly    Hiatal hernia    Hyperlipidemia    IBS (irritable bowel syndrome)    Internal hemorrhoids    Long COVID    Metabolic syndrome    Morbid obesity ([place])    PNEUMONIA, ORGAN UNSPECIFIED 03/01/2009   Qualifier: Diagnosis of  By: [name] CMA, Jennifer     Sinusitis, chronic 03/10/2012   CT sinuses 02/2012:  Chronic sinusitis with small A/F levels >> rx by ENT with prolonged augmentin F/u ct sinuses 04/2012:  Persistent sinus thickening >> rx with levaquin and clinda for 30 days.  Told to return for sinus scan in 6 weeks >> no showed for followup visit.  CT sinuses 11/2013:  Acute and chronic sinusitis noted.      Past Surgical History:  Procedure Laterality Date   ABDOMINAL HYSTERECTOMY     BIOPSY  06/22/2018   Procedure: BIOPSY;  Surgeon: Mauri Pole, MD;  Location: [facility];  Service: Endoscopy;;   BIOPSY  07/28/2018   Procedure: BIOPSY;  Surgeon: Mauri Pole, MD;  Location: [facility];  Service: Endoscopy;;   CESAREAN SECTION     COLONOSCOPY     COLONOSCOPY WITH PROPOFOL N/A 05/05/2017   Procedure: COLONOSCOPY WITH  PROPOFOL;  Surgeon: Mauri Pole, MD;  Location: [facility];  Service: Endoscopy;  Laterality: N/A;   ESOPHAGOGASTRODUODENOSCOPY (EGD) WITH PROPOFOL N/A 06/22/2018   Procedure: ESOPHAGOGASTRODUODENOSCOPY (EGD) WITH PROPOFOL;  Surgeon: Mauri Pole, MD;  Location: [facility];  Service: Endoscopy;  Laterality: N/A;   ESOPHAGOGASTRODUODENOSCOPY (EGD) WITH PROPOFOL N/A 07/28/2018   Procedure: ESOPHAGOGASTRODUODENOSCOPY (EGD) WITH PROPOFOL;  Surgeon: Mauri Pole, MD;  Location: [facility];  Service: Endoscopy;  Laterality: N/A;   NASAL SINUS SURGERY      PARTIAL HYSTERECTOMY     SINUS ENDO WITH FUSION Left 05/28/2019   Procedure: SINUS ENDO WITH FUSION;  Surgeon: [name], MD;  Location: [place];  Service: ENT;  Laterality: Left;   TONSILLECTOMY     TUBAL LIGATION     TURBINATE REDUCTION Bilateral 05/28/2019   Procedure: TURBINATE REDUCTION;  Surgeon: [name], MD;  Location: [place];  Service: ENT;  Laterality: Bilateral;   UVULOPALATOPHARYNGOPLASTY      Prior to Admission medications   Medication Sig Start Date End Date Taking? Authorizing Provider  acetaminophen (TYLENOL) 650 MG CR tablet Take 650 mg by mouth every 8 (eight) hours as needed for pain (headache).   Yes [provider]  Azelastine-Fluticasone 137-50 MCG/ACT SUSP Place 137 mcg into the nose 2 (two) times daily.   Yes [provider]  benzonatate (TESSALON) 200 MG capsule Take 200 mg by mouth 3 (three) times daily. 03/31/21  Yes [provider]  carvedilol (COREG) 12.5 MG tablet Take 0.5 tablets (6.25 mg total) by mouth daily. 04/13/21  Yes Georgette Shell, MD  dexlansoprazole (DEXILANT) 60 MG capsule TAKE 1 CAPSULE (60 MG TOTAL) BY MOUTH DAILY. DAILY IN THE MORNING 10/31/21  Yes Silas Muff, Venia Minks, MD  dicyclomine (BENTYL) 10 MG capsule Take 1 capsule (10 mg total) by mouth every 8 (eight) hours as needed for spasms. Patient taking differently: Take 10 mg by mouth 4 (four) times daily as needed for spasms. And at bedtime as needed 04/24/20  Yes Yue Glasheen V, MD  EPINEPHrine 0.3 mg/0.3 mL IJ SOAJ injection Inject 0.3 mg into the muscle as needed for anaphylaxis. 12/19/19  Yes [provider]  ergocalciferol (VITAMIN D2) 1.25 MG (50000 UT) capsule Take 50,000 Units by mouth once a week. Fridays 05/18/20  Yes [provider]  famotidine (PEPCID) 20 MG tablet TAKE 1 TABLET BY MOUTH DAILY AFTER SUPPER 10/17/21  Yes Anterio Scheel, Venia Minks, MD  FARXIGA 10 MG TABS tablet Take 1 tablet (10 mg total) by mouth daily. 08/01/21  Yes  Adrian Prows, MD  fluticasone (FLONASE) 50 MCG/ACT nasal spray Place 2 sprays into the nose in the morning, at noon, and at bedtime. 03/03/20  Yes [provider]  ipratropium (ATROVENT) 0.06 % nasal spray Place 2 sprays into both nostrils 3 (three) times daily.   Yes [provider]  ipratropium-albuterol (DUONEB) 0.5-2.5 (3) MG/3ML SOLN Take 3 mLs by nebulization 4 (four) times daily as needed (Shortness of breath).   Yes [provider]  levocetirizine (XYZAL) 5 MG tablet Take 5 mg by mouth every evening.   Yes [provider]  linaclotide (LINZESS) 290 MCG CAPS capsule TAKE 1 CAPSULE BY MOUTH EVERY DAY BEFORE BREAKFAST 10/31/21  Yes Trimaine Maser, Venia Minks, MD  meclizine (ANTIVERT) 25 MG tablet Take 25 mg by mouth 2 (two) times daily as needed for dizziness.   Yes [provider]  metolazone (ZAROXOLYN) 2.5 MG tablet Take 2.5 mg by mouth daily as needed (gain  3 lbs or more).   Yes [provider]  montelukast (SINGULAIR) 10 MG tablet TAKE 1 TABLET(10 MG) BY MOUTH AT BEDTIME 07/25/17  Yes Tanda Rockers, MD  potassium chloride SA (KLOR-CON M20) 20 MEQ tablet Take 1 tablet (20 mEq total) by mouth as directed. Take 1 tablet every time you take torsemide Patient taking differently: Take 40 mEq by mouth daily. 08/30/21 01/07/22 Yes Adrian Prows, MD  Respiratory Therapy Supplies (FLUTTER) [place] 1 application by Does not apply route as directed. 12/27/16  Yes Tanda Rockers, MD  rosuvastatin (CRESTOR) 20 MG tablet Take 1 tablet (20 mg total) by mouth daily. 08/01/21  Yes Adrian Prows, MD  spironolactone (ALDACTONE) 25 MG tablet Take 1 tablet (25 mg total) by mouth every morning. Patient taking differently: Take 50 mg by mouth every morning. 08/01/21  Yes Adrian Prows, MD  sucralfate (CARAFATE) 1 g tablet Take 1 tablet (1 g total) by mouth 2 (two) times daily as needed (stomach issues). 01/04/22  Yes Jamisha Hoeschen, Venia Minks, MD  topiramate (TOPAMAX) 25 MG tablet Take 25 mg  by mouth 2 (two) times daily.   Yes [provider]  traMADol (ULTRAM) 50 MG tablet Take 1 tablet (50 mg total) by mouth 3 (three) times daily as needed for moderate pain. Patient taking differently: Take 50 mg by mouth every 8 (eight) hours as needed for moderate pain. 01/19/20  Yes Young, Tarri Fuller D, MD  TRELEGY ELLIPTA 200-62.5-25 MCG/INH AEPB Inhale 1 puff into the lungs every morning. 05/23/20  Yes [provider]  vitamin C (ASCORBIC ACID) 250 MG tablet Take 250 mg by mouth daily as needed (Winter).   Yes [provider]  isosorbide dinitrate (ISORDIL) 20 MG tablet Take 1 tablet (20 mg total) by mouth 2 (two) times daily. Patient not taking: Reported on 01/02/2022 08/01/21   Adrian Prows, MD    Current Facility-Administered Medications  Medication Dose Route Frequency Provider Last Rate Last Admin   0.9 %  sodium chloride infusion   Intravenous Continuous Keyuana Wank, Venia Minks, MD       lactated ringers infusion   Intravenous Continuous Mauri Pole, MD 50 mL/hr at 01/07/22 0657 New Bag at 01/07/22 0657    Allergies as of 10/31/2021 - Review Complete 10/31/2021  Allergen Reaction Noted   Sulfonamide derivatives Anaphylaxis, Swelling, Rash, and Other (See Comments) 10/21/2006   Doxycycline Nausea And Vomiting 01/05/2008   Latex Hives 09/28/2011   Ciprofloxacin Rash 10/21/2006   Fish allergy Rash and Other (See Comments) 03/08/2012   Hydrocortisone Rash    Neomycin Rash 10/21/2006    Family History  Problem Relation Age of Onset   Heart attack Mother 37       1st one @ 39, 67nd one 34   Heart disease Mother    Hypertension Mother    Asthma Sister    Colon polyps Sister    Diabetes Sister    Diabetes Sister    Diabetes Brother    Diabetes Brother    Diabetes Brother    Diabetes Brother    Diabetes Half-Sister    Stroke Neg Hx    Colon cancer Neg Hx    Esophageal cancer Neg Hx    Rectal cancer Neg Hx    Stomach cancer Neg Hx     Social  History   Socioeconomic History   Marital status: Widowed    Spouse name: Not on file   Number of children: 2   Years of education: Not on  file   Highest education level: Not on file  Occupational History   Occupation: Retired  Tobacco Use   Smoking status: Never   Smokeless tobacco: Never  Vaping Use   Vaping Use: Never used  Substance and Sexual Activity   Alcohol use: No   Drug use: No   Sexual activity: Not on file  Other Topics Concern   Not on file  Social History Narrative   Charity fundraiser, Oceanographer.   Widowed, lives with son.   Social Determinants of Health   Financial Resource Strain: Not on file  Food Insecurity: Not on file  Transportation Needs: Not on file  Physical Activity: Not on file  Stress: Not on file  Social Connections: Not on file  Intimate Partner Violence: Not on file    Review of Systems:  All other review of systems negative except as mentioned in the HPI.  Physical Exam: Vital signs in last 24 hours: Temp:  [97.9 F (36.6 C)] 97.9 F (36.6 C) (12/18 0651) Pulse Rate:  [72] 72 (12/18 0651) Resp:  [20] 20 (12/18 0651) BP: (122)/(66) 122/66 (12/18 0651) SpO2:  [98 %] 98 % (12/18 0651) Weight:  [108.9 kg] 108.9 kg (12/18 0636)   General:   Alert, NAD Lungs:  Clear .   Heart:  Regular rate and rhythm Abdomen:  Soft, nontender and nondistended. Neuro/Psych:  Alert and cooperative. Normal mood and affect. A and O x 3   K. Denzil Magnuson , MD [phone]

## 2022-01-07 NOTE — Op Note (Signed)
[facility] Patient Name: [name] Procedure Date: 01/07/2022 MRN: [id number] Attending MD: Mauri Pole , MD, [id number] Date of Birth: 01-27-1960 CSN: [id number] Age: 61 Admit Type: Outpatient Procedure:                Colonoscopy Indications:              High risk colon cancer surveillance: Personal                            history of colonic polyps, High risk colon cancer                            surveillance: Personal history of large                            hamartamatous polyp (10 mm or greater in size) Providers:                Mauri Pole, MD, Mikey College, RN, Cherylynn Ridges, Technician Referring MD:              Medicines:                Monitored Anesthesia Care Complications:            No immediate complications. Estimated Blood Loss:     Estimated blood loss was minimal. Procedure:                Pre-Anesthesia Assessment:                           - Prior to the procedure, a History and Physical                            was performed, and patient medications and                            allergies were reviewed. The patient's tolerance of                            previous anesthesia was also reviewed. The risks                            and benefits of the procedure and the sedation                            options and risks were discussed with the patient.                            All questions were answered, and informed consent                            was obtained. Prior Anticoagulants: The patient has  taken no anticoagulant or antiplatelet agents. ASA                            Grade Assessment: III - A patient with severe                            systemic disease. After reviewing the risks and                            benefits, the patient was deemed in satisfactory                            condition to undergo the procedure.                            After obtaining informed consent, the colonoscope                            was passed under direct vision. Throughout the                            procedure, the patient's blood pressure, pulse, and                            oxygen saturations were monitored continuously. The                            [id number]-HQ190L ([id number]) Olympus colonoscope was                            introduced through the anus and advanced to the the                            cecum, identified by appendiceal orifice and                            ileocecal valve. The colonoscopy was performed                            without difficulty. The patient tolerated the                            procedure well. The quality of the bowel                            preparation was good. The ileocecal valve,                            appendiceal orifice, and rectum were photographed. Scope In: 8:01:42 AM Scope Out: 8:19:01 AM Scope Withdrawal Time: 0 hours 11 minutes 31 seconds  Total Procedure Duration: 0 hours 17 minutes 19 seconds  Findings:      The perianal and digital rectal examinations were normal.      A 5 mm polyp was found in the sigmoid colon.  The polyp was sessile. The       polyp was removed with a cold snare. Resection and retrieval were       complete.      Non-bleeding external and internal hemorrhoids were found during       retroflexion. The hemorrhoids were medium-sized. Impression:               - One 5 mm polyp in the sigmoid colon, removed with                            a cold snare. Resected and retrieved.                           - Non-bleeding external and internal hemorrhoids. Moderate Sedation:      N/A Recommendation:           - Patient has a contact number available for                            emergencies. The signs and symptoms of potential                            delayed complications were discussed with the                            patient. Return to normal activities  tomorrow.                            Written discharge instructions were provided to the                            patient.                           - Resume previous diet.                           - Continue present medications.                           - Await pathology results.                           - Repeat colonoscopy in 5-10 years for surveillance                            based on pathology results. Procedure Code(s):        --- Professional ---                           [phone], Colonoscopy, flexible; with removal of                            tumor(s), polyp(s), or other lesion(s) by snare                            technique Diagnosis Code(s):        ---  Professional ---                           D12.5, Benign neoplasm of sigmoid colon                           K64.8, Other hemorrhoids                           Z86.010, Personal history of colonic polyps CPT copyright 2022 [employer]. All rights reserved. The codes documented in this report are preliminary and upon coder review may  be revised to meet current compliance requirements. Mauri Pole, MD 01/07/2022 8:30:00 AM This report has been signed electronically. Number of Addenda: 0

## 2022-01-09 LAB — SURGICAL PATHOLOGY

## 2022-01-10 ENCOUNTER — Encounter (HOSPITAL_COMMUNITY): Payer: Self-pay | Admitting: Gastroenterology

## 2022-01-16 ENCOUNTER — Encounter: Payer: Self-pay | Admitting: Gastroenterology

## 2022-01-19 ENCOUNTER — Other Ambulatory Visit: Payer: Self-pay | Admitting: Cardiology

## 2022-01-19 DIAGNOSIS — I5032 Chronic diastolic (congestive) heart failure: Secondary | ICD-10-CM

## 2022-01-20 ENCOUNTER — Other Ambulatory Visit: Payer: Self-pay | Admitting: Cardiovascular Disease

## 2022-01-20 DIAGNOSIS — E7800 Pure hypercholesterolemia, unspecified: Secondary | ICD-10-CM

## 2022-01-21 ENCOUNTER — Other Ambulatory Visit: Payer: Self-pay | Admitting: Cardiology

## 2022-01-21 DIAGNOSIS — I5032 Chronic diastolic (congestive) heart failure: Secondary | ICD-10-CM

## 2022-02-25 ENCOUNTER — Telehealth: Payer: Self-pay

## 2022-02-25 NOTE — Telephone Encounter (Signed)
She may be able to reduce to 10 mg Crestor, however she needs repeat cholesterol checked prior to doing this.   Her PCP can refill the meds if she requests. I have sent Dr. Luciana Axe my recommendations

## 2022-02-25 NOTE — Telephone Encounter (Signed)
Patient wants to know if she can stop taking Crestor '20mg'$  due to her weight now being 230lb.  Should her PCP be monitoring her A1C?  What dosage of Spironolactone should she be taking? She is currently taking '50mg'$ .  Patient wants to know if we can keep refilling her cardiac medications due to you seeing her on a as needed basis.

## 2022-02-27 NOTE — Telephone Encounter (Signed)
Called patient to inform about the message above. Patient understood

## 2022-03-14 ENCOUNTER — Other Ambulatory Visit: Payer: Self-pay

## 2022-03-14 ENCOUNTER — Telehealth: Payer: Self-pay

## 2022-03-14 DIAGNOSIS — E7800 Pure hypercholesterolemia, unspecified: Secondary | ICD-10-CM

## 2022-03-14 MED ORDER — ROSUVASTATIN CALCIUM 20 MG PO TABS: 10.0000 mg | ORAL_TABLET | Freq: Every day | ORAL | 3 refills | Status: DC

## 2022-03-14 NOTE — Telephone Encounter (Signed)
If she stops her cholesterol medications, the lipids will again increase.  I will cholesterol is now well-controlled I reviewed the labs but if she were to stop it it will certainly increase cholesterol back up to high levels again.  She can discuss this with her PCP.

## 2022-03-14 NOTE — Telephone Encounter (Signed)
Called patient to inform her about  the message above. Patient mention that we need to change the dose to 10 instead of 20 in order for her PCP to start refilling it again. Sent in 75m to pharmacy and PCP will now start refilling.

## 2022-03-14 NOTE — Telephone Encounter (Signed)
Patient wants to come off of all cholesterol medication. She had labs drawn yesterday.

## 2022-03-16 ENCOUNTER — Other Ambulatory Visit: Payer: Self-pay | Admitting: Cardiology

## 2022-03-16 DIAGNOSIS — I5032 Chronic diastolic (congestive) heart failure: Secondary | ICD-10-CM

## 2022-05-09 ENCOUNTER — Encounter: Payer: Self-pay | Admitting: Nurse Practitioner

## 2022-05-09 ENCOUNTER — Ambulatory Visit (INDEPENDENT_AMBULATORY_CARE_PROVIDER_SITE_OTHER): Payer: 59

## 2022-05-09 ENCOUNTER — Ambulatory Visit (INDEPENDENT_AMBULATORY_CARE_PROVIDER_SITE_OTHER): Payer: 59 | Admitting: Nurse Practitioner

## 2022-05-09 VITALS — BP 118/74 | HR 62 | Temp 98.4°F | Ht 63.0 in | Wt 229.2 lb

## 2022-05-09 DIAGNOSIS — J329 Chronic sinusitis, unspecified: Secondary | ICD-10-CM

## 2022-05-09 DIAGNOSIS — R0789 Other chest pain: Secondary | ICD-10-CM

## 2022-05-09 DIAGNOSIS — R058 Other specified cough: Secondary | ICD-10-CM | POA: Diagnosis not present

## 2022-05-09 DIAGNOSIS — G4733 Obstructive sleep apnea (adult) (pediatric): Secondary | ICD-10-CM

## 2022-05-09 DIAGNOSIS — R059 Cough, unspecified: Secondary | ICD-10-CM

## 2022-05-09 DIAGNOSIS — I5032 Chronic diastolic (congestive) heart failure: Secondary | ICD-10-CM

## 2022-05-09 MED ORDER — PREDNISONE 20 MG PO TABS: 20.0000 mg | ORAL_TABLET | Freq: Every day | ORAL | 0 refills | Status: AC

## 2022-05-09 NOTE — Assessment & Plan Note (Signed)
She has significant postnasal drainage and mucus production. She has nasal and throat clearing frequently during our visit. No actual cough during exam/time spent with pt. Reports symptoms are at baseline. She is on aggressive maintenance regimen, which she reports compliance with. She has been trialed on gabapentin in the past without significant response. I am going to get her re-established with a local ENT to see if they have any additional treatment recommendations. Previous spirometry has been normal but has never had full PFT. Will order today for further evaluation; although, she is on high dose triple therapy with Trelegy, so would expect if this was asthmatic in nature, she would have had better response. 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

## 2022-05-09 NOTE — Assessment & Plan Note (Signed)
Appears euvolemic on exam. CXR without evidence of volume overload. See above

## 2022-05-09 NOTE — Progress Notes (Signed)
@Patient  ID: [name], female    DOB: 1960/01/26, 62 y.o.   MRN: [id number]  Chief Complaint  Patient presents with   Follow-up    Dx with long term COVID.  Cough, wheeze and chest hurts with cough.  Thick mucus in sputum.  Sx x 2 years    Referring provider: Verlon Au, MD  HPI: 62 year old female, never smoker followed for OSA on BiPAP, chronic bronchitis, and upper airway cough syndrome. She is followed by Dr. Craige Cotta for sleep and Dr. Sherene Sires for UACS. Past medical history significant for HTN, CHF, IBS, CKD, HLD, chronic sinusitis with previous sinus surgery 05/2019.  TEST/EVENTS:   09/22/2019: Ov with Groce,NP.  Reports that she has been doing well.  Continues to complain of increased mucus drainage from her throat, which is worse at night and makes it difficult to wear her BiPAP machine at times.  She has been using Mucinex and alternating Zyrtec with Claritin.  She just taking chlorpheniramine.  She is doing saline irrigations in the morning and at night as well as using Flonase nasal spray.  She is also taking an acid reflux medication.  She had been referred to ENT for further evaluation.  She had left endoscopic sinus surgery and inferior turbinate reduction surgery 05/28/2019.  Had no improvement in her sinus drainage since the surgery, if anything feels like it is worse.  Dr. Annalee Genta had referred her back to pulmonary to address the continued postnasal drip and sinus issues.  She was noted to be gagging on her own secretions in the office.  Very frustrated that she continues to have this drainage.  States that she is compliant with the above aggressive maintenance medications.  She has been challenged with gabapentin in the past without any relief.  Cough is not her biggest complaint.  Advised to follow-up with allergy as scheduled on 9/13. Resolved COVID pna on previous imaging 02/2019. Encouraged to have COVID vaccine.   11/22/2020: OV with Dr. Craige Cotta. Using BiPAP nightly. No  issues. More trouble with fatigue and tiredness. Saw cardiology. Echo pending. Cardiac CT negative for CAD. Possibly related to prior COVID infection. Advised to f/u with PCP if cardiac workup unrevealing.   05/09/2022: Today - acute COVID in 2020>> long term COVID with persistent cough and sinus symptoms May 2021 >> sinus surgery with ENT with progressive PND post procedure COVID in December 2022 and hospitalized in [place] >> chronic symptoms unchanged and recovered to baseline  Patient presents today for acute visit. There was some initial confusion regarding her concerns. She had told the CMA that she was frustrated by her cough/mucus production and that she had not had much workup for this. Upon review of her chart, she has had extensive workup, sinus surgery, and challenged with numerous medications. She also was concerned about some chest discomfort she had been experiencing, which she had told the CMA she saw cardiology in November and nothing was found to be wrong.  After further discussion between the patient and myself, her chronic symptoms of cough and postnasal drainage are unchanged compared to previous visits, dating back years. She does have new chest discomfort, which started the beginning of this month. She was not sure what caused the discomfort but went to see her PCP on 4/11. She tells me she did not receive much of a workup, in her opinion. No EKG or chest imaging was obtained. She was advised to see [place] for further evaluation. She tells me  that the pain is intermittent and primarily dull but occasionally sharp, stabbing pains. It is located on the right side of her chest and does not radiate. She describes it as a 7-8/10 when it occurs. No injury or trauma. No correlation with activity. Cough is not significant increased or more violent. She denies any increased dyspnea, palpitations, dizziness/lightheadedness, orthopnea, leg swelling, calf pain, PND, N/V, wheezing, fevers. She has  lost weight since May 2023; down from around 295 lb to 229 lb. She is actively working on weight loss and changed her diet. Appetite is good. She does tell me that she was seeing an ENT up [place] but it has been around 1 year since she saw them last. She has not re-established with someone local.   Allergies  Allergen Reactions   Sulfonamide Derivatives Anaphylaxis, Swelling, Rash and Other (See Comments)    Swelling tongue and ear    Doxycycline Nausea And Vomiting   Latex Hives   Ciprofloxacin Rash   Fish Allergy Rash and Other (See Comments)    Only reaction to Mackerel.  No other issues with any type of fish or shellfish currently    Hydrocortisone Rash   Neomycin Rash    Immunization History  Administered Date(s) Administered   Covid-19, Mrna,Vaccine(Spikevax)43yrs and older 12/19/2021   Influenza Split 11/21/2013, 11/21/2015, 10/21/2016   Influenza,inj,Quad PF,6+ Mos 02/23/2015, 11/21/2015, 11/11/2018   Influenza-Unspecified 11/25/2013, 02/23/2015, 10/21/2016, 11/11/2018   PFIZER Comirnaty(Gray Top)Covid-19 Tri-Sucrose Vaccine 05/17/2020   PFIZER(Purple Top)SARS-COV-2 Vaccination 09/28/2019, 11/03/2019, 05/17/2020   Pfizer Covid-19 Vaccine Bivalent Booster 63yrs & up 12/06/2020   Pneumococcal Polysaccharide-23 10/30/2005, 11/23/2015   Td 08/30/2005    Past Medical History:  Diagnosis Date   Acute on chronic diastolic CHF (congestive heart failure) 04/11/2014   Acute suppurative otitis media without spontaneous rupture of eardrum 02/04/2007   Centricity Description: OTITIS MEDIA, SUPPURATIVE, ACUTE, BILATERAL Qualifier: Diagnosis of  By: Everardo All MD, Cleophas Dunker  Centricity Description: OTITIS MEDIA, SUPPURATIVE, ACUTE Qualifier: Diagnosis of  By: Everardo All MD, Sean A    Allergic rhinitis    Allergy    Anemia    Anxiety    Arthritis    Atherosclerosis of aorta 02/21/2021   CAD (coronary artery disease)    LHC (11/29/1999):  EF 60%; no significant CAD.   Carpal tunnel syndrome     Carpal tunnel syndrome 06/09/2006   Qualifier: Diagnosis of  By: Charlsie Quest RMA, Lucy     Chronic diastolic CHF (congestive heart failure)    a. Echo (01/21/11): Vigorous LVF, EF 65-70%, no RWMA, Gr 2 DD. b.  Echo (12/15): Moderate LVH, EF 60-65%, grade 2 diastolic dysfunction, mild LAE, normal RV function c. 01/2015: echo showing EF of 60-65% with mild LVH   Common migraine    COPD (chronic obstructive pulmonary disease)    COVID-19    x 2   Diastolic heart failure    Diverticulitis    Dyspnea    Hepatic steatosis    Hepatomegaly    Hiatal hernia    Hyperlipidemia    IBS (irritable bowel syndrome)    Internal hemorrhoids    Long COVID    Metabolic syndrome    Morbid obesity    PNEUMONIA, ORGAN UNSPECIFIED 03/01/2009   Qualifier: Diagnosis of  By: Yancey Flemings CMA, Jennifer     Sinusitis, chronic 03/10/2012   CT sinuses 02/2012:  Chronic sinusitis with small A/F levels >> rx by ENT with prolonged augmentin F/u ct sinuses 04/2012:  Persistent sinus thickening >> rx with levaquin  and clinda for 30 days.  Told to return for sinus scan in 6 weeks >> no showed for followup visit.  CT sinuses 11/2013:  Acute and chronic sinusitis noted.      Tobacco History: Social History   Tobacco Use  Smoking Status Never  Smokeless Tobacco Never   Counseling given: Not Answered   Outpatient Medications Prior to Visit  Medication Sig Dispense Refill   acetaminophen (TYLENOL) 650 MG CR tablet Take 650 mg by mouth every 8 (eight) hours as needed for pain (headache).     Azelastine-Fluticasone 137-50 MCG/ACT SUSP Place 137 mcg into the nose 2 (two) times daily.     benzonatate (TESSALON) 200 MG capsule Take 200 mg by mouth 3 (three) times daily.     carvedilol (COREG) 12.5 MG tablet Take 0.5 tablets (6.25 mg total) by mouth daily. 180 tablet 2   dexlansoprazole (DEXILANT) 60 MG capsule TAKE 1 CAPSULE (60 MG TOTAL) BY MOUTH DAILY. DAILY IN THE MORNING 90 capsule 3   dicyclomine (BENTYL) 10 MG capsule Take 1  capsule (10 mg total) by mouth every 8 (eight) hours as needed for spasms. (Patient taking differently: Take 10 mg by mouth 4 (four) times daily as needed for spasms. And at bedtime as needed) 60 capsule 1   EPINEPHrine 0.3 mg/0.3 mL IJ SOAJ injection Inject 0.3 mg into the muscle as needed for anaphylaxis.     ergocalciferol (VITAMIN D2) 1.25 MG (50000 UT) capsule Take 50,000 Units by mouth once a week. Fridays     famotidine (PEPCID) 20 MG tablet TAKE 1 TABLET BY MOUTH DAILY AFTER SUPPER 90 tablet 3   FARXIGA 10 MG TABS tablet TAKE 1 TABLET BY MOUTH EVERY DAY 90 tablet 1   fluticasone (FLONASE) 50 MCG/ACT nasal spray Place 2 sprays into the nose in the morning, at noon, and at bedtime.     ipratropium (ATROVENT) 0.06 % nasal spray Place 2 sprays into both nostrils 3 (three) times daily.     ipratropium-albuterol (DUONEB) 0.5-2.5 (3) MG/3ML SOLN Take 3 mLs by nebulization 4 (four) times daily as needed (Shortness of breath).     isosorbide dinitrate (ISORDIL) 20 MG tablet Take 1 tablet (20 mg total) by mouth 2 (two) times daily. 270 tablet 3   KLOR-CON M20 20 MEQ tablet TAKE 1 TABLET BY MOUTH AS DIRECTED. 1 TABLET DAILY. TAKE EXTRA TAB WITH TORSEMIDE EXTRA DOSE 180 tablet 1   levocetirizine (XYZAL) 5 MG tablet Take 5 mg by mouth every evening.     linaclotide (LINZESS) 290 MCG CAPS capsule TAKE 1 CAPSULE BY MOUTH EVERY DAY BEFORE BREAKFAST 30 capsule 6   meclizine (ANTIVERT) 25 MG tablet Take 25 mg by mouth 2 (two) times daily as needed for dizziness.     metolazone (ZAROXOLYN) 2.5 MG tablet Take 2.5 mg by mouth daily as needed (gain 3 lbs or more).     montelukast (SINGULAIR) 10 MG tablet TAKE 1 TABLET(10 MG) BY MOUTH AT BEDTIME 30 tablet 1   Respiratory Therapy Supplies (FLUTTER) DEVI 1 application by Does not apply route as directed. 1 each 0   rosuvastatin (CRESTOR) 20 MG tablet Take 0.5 tablets (10 mg total) by mouth daily. 90 tablet 3   spironolactone (ALDACTONE) 25 MG tablet Take 1 tablet  (25 mg total) by mouth every morning. (Patient taking differently: Take 50 mg by mouth every morning.) 90 tablet 1   sucralfate (CARAFATE) 1 g tablet Take 1 tablet (1 g total) by mouth 2 (two) times  daily as needed (stomach issues). 60 tablet 0   topiramate (TOPAMAX) 25 MG tablet Take 25 mg by mouth 2 (two) times daily.     traMADol (ULTRAM) 50 MG tablet Take 1 tablet (50 mg total) by mouth 3 (three) times daily as needed for moderate pain. (Patient taking differently: Take 50 mg by mouth every 8 (eight) hours as needed for moderate pain.) 90 tablet 1   TRELEGY ELLIPTA 200-62.5-25 MCG/INH AEPB Inhale 1 puff into the lungs every morning.     vitamin C (ASCORBIC ACID) 250 MG tablet Take 250 mg by mouth daily as needed (Winter).     No facility-administered medications prior to visit.     Review of Systems:   Constitutional: No night sweats, fevers, chills, or lassitude. +fatigue (baseline), intentional weight loss  HEENT: No headaches, difficulty swallowing, tooth/dental problems, or sore throat. No sneezing, itching, ear ache. +chronic nasal congestion, post nasal drip CV:  +intermittent CP. No orthopnea, PND, swelling in lower extremities, anasarca, dizziness, palpitations, syncope Resp: + shortness of breath with exertion (baseline); cough (baseline). No  change in color of mucus. No hemoptysis. No wheezing.  No chest wall deformity GI:  No heartburn, indigestion, abdominal pain, nausea, vomiting, diarrhea, change in bowel habits, loss of appetite, bloody stools.  GU: No dysuria, change in color of urine, urgency or frequency.   Skin: No rash, lesions, ulcerations MSK:  No joint pain or swelling.   Neuro: No dizziness or lightheadedness.  Psych: No depression or anxiety. Mood stable.     Physical Exam:  BP 118/74 (BP Location: Right Arm, Patient Position: Sitting, Cuff Size: Large)   Pulse 62   Temp 98.4 F (36.9 C) (Oral)   Ht 5\' 3"  (1.6 m)   Wt 229 lb 3.2 oz (104 kg)   SpO2 98%    BMI 40.60 kg/m   GEN: Pleasant, interactive, well-kempt; morbidly obese; in no acute distress. HEENT:  Normocephalic and atraumatic. PERRLA. Sclera white. Nasal turbinates pink, moist and patent bilaterally. No rhinorrhea present. Oropharynx pink and moist, without exudate or edema. No lesions, ulcerations, or postnasal drip.  NECK:  Supple w/ fair ROM. No JVD present. Normal carotid impulses w/o bruits. Thyroid symmetrical with no goiter or nodules palpated. No lymphadenopathy.   CV: RRR, no m/r/g, no peripheral edema. Pulses intact, +2 bilaterally. No cyanosis, pallor or clubbing. PULMONARY:  Unlabored, regular breathing. Clear bilaterally A&P w/o wheezes/rales/rhonchi. No accessory muscle use.  GI: BS present and normoactive. Soft, non-tender to palpation. No organomegaly or masses detected. MSK: No erythema, warmth. Mild tenderness upon palpation to anterior right chest wall. Cap refil <2 sec all extrem. No deformities or joint swelling noted.  Neuro: A/Ox3. No focal deficits noted.   Skin: Warm, no lesions or rashe Psych: Normal affect and behavior. Judgement and thought content appropriate.     Lab Results:  CBC    Component Value Date/Time   WBC 12.8 (H) 10/24/2021 1430   RBC 4.15 10/24/2021 1430   HGB 12.5 10/24/2021 1430   HCT 39.3 10/24/2021 1430   PLT 253 10/24/2021 1430   MCV 94.7 10/24/2021 1430   MCH 30.1 10/24/2021 1430   MCHC 31.8 10/24/2021 1430   RDW 14.6 10/24/2021 1430   LYMPHSABS 3.0 10/24/2021 1430   MONOABS 0.7 10/24/2021 1430   EOSABS 0.4 10/24/2021 1430   BASOSABS 0.0 10/24/2021 1430    BMET    Component Value Date/Time   NA 139 12/10/2021 1042   K 4.5 12/10/2021 1042  CL 108 (H) 12/10/2021 1042   CO2 18 (L) 12/10/2021 1042   GLUCOSE 90 12/10/2021 1042   GLUCOSE 99 10/24/2021 1430   BUN 13 12/10/2021 1042   CREATININE 0.91 12/10/2021 1042   CREATININE 0.81 08/25/2015 1050   CALCIUM 9.2 12/10/2021 1042   GFRNONAA >60 10/24/2021 1430   GFRAA  56 (L) 12/01/2019 1637    BNP    Component Value Date/Time   BNP 32.9 10/24/2021 1430     Imaging:  DG Chest 2 View  Result Date: 05/09/2022 CLINICAL DATA:  Cough.  New onset right-sided chest pain. EXAM: CHEST - 2 VIEW COMPARISON:  Chest radiographs 10/24/2021 and 04/16/2021 FINDINGS: Silhouette is again mildly enlarged. Mediastinal contours are within limits. The lungs are clear. No pleural effusion or pneumothorax. Mild dextrocurvature of the mid to upper thoracic spine. Moderate multilevel disc space narrowing and anterior bridging osteophytes of the thoracic spine. Moderate bilateral, there osteoarthritis. IMPRESSION: No active cardiopulmonary disease. Electronically Signed   By: Neita Garnet M.D.   On: 05/09/2022 13:03          No data to display          Lab Results  Component Value Date   NITRICOXIDE 8 06/01/2015        Assessment & Plan:   Costochondral chest pain Seems MSK in nature. No acute fracture or superimposed infection on imaging today. EKG was relatively unchanged from previous without any ST abnormalities. We will treat her with short prednisone burst to see if she has any perceived benefit from this. Advised on topical OTC regimens as well. Side effect profiles reviewed. Encouraged her to schedule f/u with Dr. Charleston Poot, cardiology, to ensure no cardiac component; however, less likely given presentation/exam. Low suspicion for PE based on Wells criteria and symptom presentation.  Patient Instructions  Continue azelastine-fluticasone nasal spray 1 spray each nostril Twice daily  Continue benzonatate 1 capsule Three times a day for cough  Continue dexilant 1 capsule daily Continue famotidine 1 tablet daily before supper  Continue atrovent nasal spray 2 sprays each nostril Three times a day for nasal congestion Continue levocetirizine 1 tab daily for allergies Continue montelukast 1 tab daily for allergies Continue Trelegy 1 puff daily. Brush tongue and  rinse mouth afterwards Continue BiPAP nightly  Prednisone 20 mg daily for 5 days. Take in AM with food.  Delsym 2 tsp Twice daily for cough Start saline nasal rinses 1-2 times a day. Use 20-30 minutes before your other nasal sprays. Use bottled, distilled water and sodium chloride packets   Upper airway cough syndrome: Suppress your cough to allow your larynx (voice box) to heal.  Limit talking for the next few days. Avoid throat clearing. Work on cough suppression with the above recommended suppressants.  Use sugar free hard candies or non-menthol cough drops during this time to soothe your throat.  Warm tea with honey and lemon.   Referral to ENT for your sinus symptoms and chronic throat clearing  Call cardiology for an appointment to discuss the chest discomfort   Follow up in 3 months with Dr. Sherene Sires (1st) after full PFT. If symptoms do not improve or worsen, please contact office for sooner follow up or seek emergency care.    Upper airway cough syndrome She has significant postnasal drainage and mucus production. She has nasal and throat clearing frequently during our visit. No actual cough during exam/time spent with pt. Reports symptoms are at baseline. She is on aggressive maintenance regimen, which she  reports compliance with. She has been trialed on gabapentin in the past without significant response. I am going to get her re-established with a local ENT to see if they have any additional treatment recommendations. Previous spirometry has been normal but has never had full PFT. Will order today for further evaluation; although, she is on high dose triple therapy with Trelegy, so would expect if this was asthmatic in nature, she would have had better response. 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   Obstructive sleep apnea Compliant with BiPAP most nights with good control. Receives benefit from use.    Chronic diastolic heart failure (HCC) Appears euvolemic on exam. CXR without evidence of volume overload. See above   I spent 45 minutes of dedicated to the care of this patient on the date of this encounter to include pre-visit review of records, face-to-face time with the patient discussing conditions above, post visit ordering of testing, clinical documentation with the electronic health record, making appropriate referrals as documented, and communicating necessary findings to members of the patients care team.  Noemi Chapel, NP 05/09/2022  Pt aware and understands NP's role.

## 2022-05-09 NOTE — Assessment & Plan Note (Signed)
Compliant with BiPAP most nights with good control. Receives benefit from use.

## 2022-05-09 NOTE — Patient Instructions (Addendum)
Continue azelastine-fluticasone nasal spray 1 spray each nostril Twice daily  Continue benzonatate 1 capsule Three times a day for cough  Continue dexilant 1 capsule daily Continue famotidine 1 tablet daily before supper  Continue atrovent nasal spray 2 sprays each nostril Three times a day for nasal congestion Continue levocetirizine 1 tab daily for allergies Continue montelukast 1 tab daily for allergies Continue Trelegy 1 puff daily. Brush tongue and rinse mouth afterwards Continue BiPAP nightly  Prednisone 20 mg daily for 5 days. Take in AM with food.  Delsym 2 tsp Twice daily for cough Start saline nasal rinses 1-2 times a day. Use 20-30 minutes before your other nasal sprays. Use bottled, distilled water and sodium chloride packets   Upper airway cough syndrome: Suppress your cough to allow your larynx (voice box) to heal.  Limit talking for the next few days. Avoid throat clearing. Work on cough suppression with the above recommended suppressants.  Use sugar free hard candies or non-menthol cough drops during this time to soothe your throat.  Warm tea with honey and lemon.   Referral to ENT for your sinus symptoms and chronic throat clearing  Call cardiology for an appointment to discuss the chest discomfort   Follow up in 3 months with [name] (1st) after full PFT. If symptoms do not improve or worsen, please contact office for sooner follow up or seek emergency care.

## 2022-05-09 NOTE — Assessment & Plan Note (Addendum)
Seems MSK in nature. No acute fracture or superimposed infection on imaging today. EKG was relatively unchanged from previous without any ST abnormalities. We will treat her with short prednisone burst to see if she has any perceived benefit from this. Advised on topical OTC regimens as well. Side effect profiles reviewed. Encouraged her to schedule f/u with Dr. Charleston Poot, cardiology, to ensure no cardiac component; however, less likely given presentation/exam. Low suspicion for PE based on Wells criteria and symptom presentation.  Patient Instructions  Continue azelastine-fluticasone nasal spray 1 spray each nostril Twice daily  Continue benzonatate 1 capsule Three times a day for cough  Continue dexilant 1 capsule daily Continue famotidine 1 tablet daily before supper  Continue atrovent nasal spray 2 sprays each nostril Three times a day for nasal congestion Continue levocetirizine 1 tab daily for allergies Continue montelukast 1 tab daily for allergies Continue Trelegy 1 puff daily. Brush tongue and rinse mouth afterwards Continue BiPAP nightly  Prednisone 20 mg daily for 5 days. Take in AM with food.  Delsym 2 tsp Twice daily for cough Start saline nasal rinses 1-2 times a day. Use 20-30 minutes before your other nasal sprays. Use bottled, distilled water and sodium chloride packets   Upper airway cough syndrome: Suppress your cough to allow your larynx (voice box) to heal.  Limit talking for the next few days. Avoid throat clearing. Work on cough suppression with the above recommended suppressants.  Use sugar free hard candies or non-menthol cough drops during this time to soothe your throat.  Warm tea with honey and lemon.   Referral to ENT for your sinus symptoms and chronic throat clearing  Call cardiology for an appointment to discuss the chest discomfort   Follow up in 3 months with Dr. Sherene Sires (1st) after full PFT. If symptoms do not improve or worsen, please contact office for sooner  follow up or seek emergency care.

## 2022-05-15 ENCOUNTER — Encounter (HOSPITAL_COMMUNITY): Payer: Self-pay

## 2022-05-15 ENCOUNTER — Telehealth: Payer: Self-pay | Admitting: Nurse Practitioner

## 2022-05-15 ENCOUNTER — Other Ambulatory Visit: Payer: Self-pay

## 2022-05-15 ENCOUNTER — Emergency Department (HOSPITAL_COMMUNITY): Payer: 59

## 2022-05-15 ENCOUNTER — Emergency Department (HOSPITAL_COMMUNITY)
Admission: EM | Admit: 2022-05-15 | Discharge: 2022-05-15 | Disposition: A | Discharge status: Home / Self Care | Payer: 59 | Attending: Emergency Medicine | Admitting: Emergency Medicine

## 2022-05-15 DIAGNOSIS — I509 Heart failure, unspecified: Secondary | ICD-10-CM | POA: Insufficient documentation

## 2022-05-15 DIAGNOSIS — R0789 Other chest pain: Secondary | ICD-10-CM | POA: Insufficient documentation

## 2022-05-15 DIAGNOSIS — J329 Chronic sinusitis, unspecified: Secondary | ICD-10-CM | POA: Insufficient documentation

## 2022-05-15 DIAGNOSIS — Z91040 Latex allergy status: Secondary | ICD-10-CM | POA: Insufficient documentation

## 2022-05-15 DIAGNOSIS — Z7951 Long term (current) use of inhaled steroids: Secondary | ICD-10-CM | POA: Insufficient documentation

## 2022-05-15 DIAGNOSIS — J449 Chronic obstructive pulmonary disease, unspecified: Secondary | ICD-10-CM | POA: Diagnosis not present

## 2022-05-15 LAB — BASIC METABOLIC PANEL
Anion gap: 8 (ref 5–15)
BUN: 23 mg/dL (ref 8–23)
CO2: 22 mmol/L (ref 22–32)
Calcium: 8.7 mg/dL — ABNORMAL LOW (ref 8.9–10.3)
Chloride: 107 mmol/L (ref 98–111)
Creatinine, Ser: 0.87 mg/dL (ref 0.44–1.00)
GFR, Estimated: >60 mL/min (ref >60)
Glucose, Bld: 93 mg/dL (ref 70–99)
Potassium: 4.1 mmol/L (ref 3.5–5.1)
Sodium: 137 mmol/L (ref 135–145)

## 2022-05-15 LAB — CBC
HCT: 41.7 % (ref 36.0–46.0)
Hemoglobin: 13.1 g/dL (ref 12.0–15.0)
MCH: 31 pg (ref 26.0–34.0)
MCHC: 31.4 g/dL (ref 30.0–36.0)
MCV: 98.6 fL (ref 80.0–100.0)
Platelets: 272 10*3/uL (ref 150–400)
RBC: 4.23 MIL/uL (ref 3.87–5.11)
RDW: 14 % (ref 11.5–15.5)
WBC: 13.1 10*3/uL — ABNORMAL HIGH (ref 4.0–10.5)
nRBC: 0 % (ref 0.0–0.2)

## 2022-05-15 LAB — TROPONIN I (HIGH SENSITIVITY)
Troponin I (High Sensitivity): 3 ng/L (ref <18)
Troponin I (High Sensitivity): 2 ng/L (ref <18)

## 2022-05-15 MED ORDER — AMOXICILLIN-POT CLAVULANATE 875-125 MG PO TABS: 1.0000 | ORAL_TABLET | Freq: Two times a day (BID) | ORAL | 0 refills | Status: DC

## 2022-05-15 MED ORDER — AZITHROMYCIN 250 MG PO TABS: 500.0000 mg | ORAL_TABLET | Freq: Once | ORAL | Status: DC

## 2022-05-15 MED ORDER — AMOXICILLIN-POT CLAVULANATE 875-125 MG PO TABS
1.0000 | ORAL_TABLET | Freq: Once | ORAL | Status: AC
  Administered 2022-05-15: 1 via ORAL
  Filled 2022-05-15: qty 1

## 2022-05-15 NOTE — ED Provider Notes (Signed)
EMERGENCY DEPARTMENT AT [facility] Provider Note   CSN: [id number] Arrival date & time: 05/15/22  7829     History  Chief Complaint  Patient presents with   Chest Pain    Runny nose     [name] is a 62 y.o. female.  HPI Patient presents with concern for right-sided chest pain. Patient has a history of CHF, chronic sinusitis, COPD, follows up with primary care and pulmonology.  She notes that she has had right-sided chest pain since a few days ago, as well as ongoing sinus drainage.  She was seen, evaluated in clinic last week, completed a course of steroids, notes that she continues to have symptoms. No fever, no vomiting, no abdominal pain, no left-sided chest pain.     Home Medications Prior to Admission medications   Medication Sig Start Date End Date Taking? Authorizing Provider  amoxicillin-clavulanate (AUGMENTIN) 875-125 MG tablet Take 1 tablet by mouth every 12 (twelve) hours. 05/15/22  Yes Gerhard Munch, MD  acetaminophen (TYLENOL) 650 MG CR tablet Take 650 mg by mouth every 8 (eight) hours as needed for pain (headache).    [provider]  Azelastine-Fluticasone 137-50 MCG/ACT SUSP Place 137 mcg into the nose 2 (two) times daily.    [provider]  benzonatate (TESSALON) 200 MG capsule Take 200 mg by mouth 3 (three) times daily. 03/31/21   [provider]  carvedilol (COREG) 12.5 MG tablet Take 0.5 tablets (6.25 mg total) by mouth daily. 04/13/21   Alwyn Ren, MD  dexlansoprazole (DEXILANT) 60 MG capsule TAKE 1 CAPSULE (60 MG TOTAL) BY MOUTH DAILY. DAILY IN THE MORNING 10/31/21   Napoleon Form, MD  dicyclomine (BENTYL) 10 MG capsule Take 1 capsule (10 mg total) by mouth every 8 (eight) hours as needed for spasms. Patient taking differently: Take 10 mg by mouth 4 (four) times daily as needed for spasms. And at bedtime as needed 04/24/20   Napoleon Form, MD  EPINEPHrine 0.3 mg/0.3 mL IJ SOAJ  injection Inject 0.3 mg into the muscle as needed for anaphylaxis. 12/19/19   [provider]  ergocalciferol (VITAMIN D2) 1.25 MG (50000 UT) capsule Take 50,000 Units by mouth once a week. Fridays 05/18/20   [provider]  famotidine (PEPCID) 20 MG tablet TAKE 1 TABLET BY MOUTH DAILY AFTER SUPPER 10/17/21   Nandigam, Eleonore Chiquito, MD  FARXIGA 10 MG TABS tablet TAKE 1 TABLET BY MOUTH EVERY DAY 01/22/22   Yates Decamp, MD  fluticasone (FLONASE) 50 MCG/ACT nasal spray Place 2 sprays into the nose in the morning, at noon, and at bedtime. 03/03/20   [provider]  ipratropium (ATROVENT) 0.06 % nasal spray Place 2 sprays into both nostrils 3 (three) times daily.    [provider]  ipratropium-albuterol (DUONEB) 0.5-2.5 (3) MG/3ML SOLN Take 3 mLs by nebulization 4 (four) times daily as needed (Shortness of breath).    [provider]  isosorbide dinitrate (ISORDIL) 20 MG tablet Take 1 tablet (20 mg total) by mouth 2 (two) times daily. 08/01/21   Yates Decamp, MD  KLOR-CON M20 20 MEQ tablet TAKE 1 TABLET BY MOUTH AS DIRECTED. 1 TABLET DAILY. TAKE EXTRA TAB WITH TORSEMIDE EXTRA DOSE 01/22/22   Yates Decamp, MD  levocetirizine (XYZAL) 5 MG tablet Take 5 mg by mouth every evening.    [provider]  linaclotide (LINZESS) 290 MCG CAPS capsule TAKE 1 CAPSULE BY MOUTH EVERY DAY BEFORE BREAKFAST 10/31/21  Napoleon Form, MD  meclizine (ANTIVERT) 25 MG tablet Take 25 mg by mouth 2 (two) times daily as needed for dizziness.    [provider]  metolazone (ZAROXOLYN) 2.5 MG tablet Take 2.5 mg by mouth daily as needed (gain 3 lbs or more).    [provider]  montelukast (SINGULAIR) 10 MG tablet TAKE 1 TABLET(10 MG) BY MOUTH AT BEDTIME 07/25/17   Nyoka Cowden, MD  Respiratory Therapy Supplies (FLUTTER) DEVI 1 application by Does not apply route as directed. 12/27/16   Nyoka Cowden, MD  rosuvastatin (CRESTOR) 20 MG tablet Take 0.5 tablets (10 mg  total) by mouth daily. 03/14/22   Yates Decamp, MD  spironolactone (ALDACTONE) 25 MG tablet Take 1 tablet (25 mg total) by mouth every morning. Patient taking differently: Take 50 mg by mouth every morning. 08/01/21   Yates Decamp, MD  sucralfate (CARAFATE) 1 g tablet Take 1 tablet (1 g total) by mouth 2 (two) times daily as needed (stomach issues). 01/04/22   Napoleon Form, MD  topiramate (TOPAMAX) 25 MG tablet Take 25 mg by mouth 2 (two) times daily.    [provider]  traMADol (ULTRAM) 50 MG tablet Take 1 tablet (50 mg total) by mouth 3 (three) times daily as needed for moderate pain. Patient taking differently: Take 50 mg by mouth every 8 (eight) hours as needed for moderate pain. 01/19/20   Jetty Duhamel D, MD  TRELEGY ELLIPTA 200-62.5-25 MCG/INH AEPB Inhale 1 puff into the lungs every morning. 05/23/20   [provider]  vitamin C (ASCORBIC ACID) 250 MG tablet Take 250 mg by mouth daily as needed (Winter).    [provider]      Allergies    Sulfonamide derivatives, Doxycycline, Latex, Ciprofloxacin, Fish allergy, Hydrocortisone, and Neomycin    Review of Systems   Review of Systems  All other systems reviewed and are negative.   Physical Exam Updated Vital Signs BP 117/74   Pulse (!) 46   Temp 98.2 F (36.8 C) (Oral)   Resp 17   Ht  (1.6 m)   Wt 99.8 kg   SpO2 90%   BMI 38.97 kg/m  Physical Exam Vitals and nursing note reviewed.  Constitutional:      General: She is not in acute distress.    Appearance: She is well-developed. She is obese.  HENT:     Head: Normocephalic and atraumatic.  Eyes:     Conjunctiva/sclera: Conjunctivae normal.  Cardiovascular:     Rate and Rhythm: Normal rate and regular rhythm.  Pulmonary:     Effort: Pulmonary effort is normal. No respiratory distress.     Breath sounds: Normal breath sounds. No stridor.  Abdominal:     General: There is no distension.  Skin:    General: Skin is warm and dry.   Neurological:     Mental Status: She is alert and oriented to person, place, and time.     Cranial Nerves: No cranial nerve deficit.  Psychiatric:        Mood and Affect: Mood normal.     ED Results / Procedures / Treatments   Labs (all labs ordered are listed, but only abnormal results are displayed) Labs Reviewed  BASIC METABOLIC PANEL - Abnormal; Notable for the following components:      Result Value   Calcium 8.7 (*)    All other components within normal limits  CBC - Abnormal; Notable for the following components:  WBC 13.1 (*)    All other components within normal limits  TROPONIN I (HIGH SENSITIVITY)  TROPONIN I (HIGH SENSITIVITY)    EKG EKG Interpretation  Date/Time:  Wednesday May 15 2022 09:46:21 EDT Ventricular Rate:  161 PR Interval:    QRS Duration: 128 QT Interval:  379 QTC Calculation: 501 R Axis:   65 Text Interpretation: Sinus rhythm Artifact Abnormal ECG Confirmed by Gerhard Munch [phone]) on 05/15/2022 1:31:01 PM  Radiology CT Chest Wo Contrast  Result Date: 05/15/2022 CLINICAL DATA:  Chest wall pain. Right-sided chest pain worsened by coughing or blowing nose. No history of trauma. Concern for occult infection. EXAM: CT CHEST WITHOUT CONTRAST TECHNIQUE: Multidetector CT imaging of the chest was performed following the standard protocol without IV contrast. RADIATION DOSE REDUCTION: This exam was performed according to the departmental dose-optimization program which includes automated exposure control, adjustment of the mA and/or kV according to patient size and/or use of iterative reconstruction technique. COMPARISON:  Chest CTA 10/24/2021 FINDINGS: Cardiovascular: The heart is enlarged. No substantial pericardial effusion. Mild atherosclerotic calcification is noted in the wall of the thoracic aorta. Mediastinum/Nodes: No mediastinal lymphadenopathy. No evidence for gross hilar lymphadenopathy although assessment is limited by the lack of intravenous  contrast on the current study. Tiny hiatal hernia. The esophagus has normal imaging features. There is no axillary lymphadenopathy. 2.1 cm right thyroid nodule evident. This has been evaluated on previous imaging. (ref: J Am Coll Radiol. 2015 Feb;12(2): 143-50). Lungs/Pleura: 5 mm right middle lobe nodule identified on image 70/4, new in the interval. Dependent atelectasis noted in both lung bases. No focal consolidation. No pulmonary edema. No pleural effusion. No pneumothorax. Upper Abdomen: Visualized portion of the upper abdomen is unremarkable. 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 No followup imaging is recommended. Musculoskeletal: No worrisome lytic or sclerotic osseous abnormality. No evidence for rib fracture. No soft tissue mass identified right chest wall. No worrisome lytic or sclerotic rib abnormality. IMPRESSION: 1. No acute findings in the chest. Specifically, no findings to explain the patient's history of right-sided chest wall pain. 2. 5 mm right middle lobe pulmonary nodule, new in the interval. No follow-up needed if patient is low-risk.This recommendation follows the consensus statement: Guidelines for Management of Incidental Pulmonary Nodules Detected on CT Images: From the Fleischner Society 2017; Radiology 2017; 284:228-243. 3.  Aortic Atherosclerosis (ICD10-I70.0). Electronically Signed   By: Kennith Center M.D.   On: 05/15/2022 12:01   DG Chest 2 View  Result Date: 05/15/2022 CLINICAL DATA:  Provided history: Chest pain. EXAM: CHEST - 2 VIEW COMPARISON:  Radiographs 05/09/2022 and earlier. FINDINGS: Mild cardiomegaly. No appreciable airspace consolidation or pulmonary edema. No evidence of pleural effusion or pneumothorax. No acute osseous abnormality identified. Dextrocurvature of the thoracic spine. IMPRESSION: 1.  No evidence of acute cardiopulmonary abnormality. 2. Mild cardiomegaly.  Electronically Signed   By: Jackey Loge D.O.   On: 05/15/2022 10:14    Procedures Procedures    Medications Ordered in ED Medications  amoxicillin-clavulanate (AUGMENTIN) 875-125 MG per tablet 1 tablet (has no administration in time range)    ED Course/ Medical Decision Making/ A&P                             Medical Decision Making Patient presents with concern for right-sided chest pain with ongoing sinus congestion. She is awake, alert, speaking  clearly, we given her history of recurrent sinusitis, CHF, infection and obesity, pneumonia, mass, bacteremia, sepsis were considerations. ACS considered, less likely. Cardiac 55 sinus bradycardia borderline Pulse ox 95% borderline   Amount and/or Complexity of Data Reviewed External Data Reviewed: notes. Labs: ordered. Decision-making details documented in ED Course. Radiology: ordered and independent interpretation performed. Decision-making details documented in ED Course.    Details: After initial x-ray was unremarkable, with consideration of occult pneumonia CT scan performed. ECG/medicine tests: ordered and independent interpretation performed. Decision-making details documented in ED Course.  Risk Prescription drug management. Decision regarding hospitalization.   Update: Patient in no distress. Labs reviewed, CT, x-ray reviewed. No evidence for pneumonia, though she does have a new small nodule on the right side, she can follow-up with her pulmonologist for repeat testing in a few months in this regard. She is aware of the finding. Troponin x 2 normal, EKG nonischemic, no evidence for bacteremia, sepsis either. Some suspicion for acute on chronic sinusitis contributing to her symptoms, patient will follow-up closely as an outpatient.         Final Clinical Impression(s) / ED Diagnoses Final diagnoses:  Atypical chest pain  Sinusitis, unspecified chronicity, unspecified location    Rx / DC Orders ED Discharge  Orders          Ordered    amoxicillin-clavulanate (AUGMENTIN) 875-125 MG tablet  Every 12 hours        05/15/22 1335              Gerhard Munch, MD 05/15/22 1335

## 2022-05-15 NOTE — Discharge Instructions (Addendum)
As discussed, your evaluation today has been largely reassuring.  But, it is important that you monitor your condition carefully, and do not hesitate to return to the ED if you develop new, or concerning changes in your condition.  Your CT scan today did demonstrate a pulmonary nodule of 5 mm.  This requires consideration of additional CT imaging in about 6 months.  Please discuss this with your physician.  Otherwise, please follow-up with your physician for appropriate ongoing care.

## 2022-05-15 NOTE — ED Triage Notes (Signed)
Pt to er, pt states that she has chronic sinus problems, states that last week she went to see her pulmonologist and said that she might have a sinus infection, states that she went to her pmd.  States that she is here today because she is having some R sided chest pain that is worse when she coughs or blows her nose

## 2022-05-15 NOTE — Telephone Encounter (Signed)
Patient is in the emergency room, completed a CT scan which 5 mm right middle lobe nodule identified on image 70/4, new in the interval- they are telling her the CT needs to be redone. Patient is upset that she was just seen in our office on 4/18. Please call patient to discuss next steps.

## 2022-05-17 ENCOUNTER — Telehealth: Payer: Self-pay | Admitting: Nurse Practitioner

## 2022-05-17 NOTE — Telephone Encounter (Signed)
Pt. Called saying she need to speak to a manger asap would not tell me the problem but wanted to speak to manger

## 2022-05-20 ENCOUNTER — Ambulatory Visit (INDEPENDENT_AMBULATORY_CARE_PROVIDER_SITE_OTHER): Payer: 59 | Admitting: Internal Medicine

## 2022-05-20 ENCOUNTER — Encounter: Payer: Self-pay | Admitting: Internal Medicine

## 2022-05-20 VITALS — BP 116/70 | HR 73 | Temp 98.4°F | Ht 63.0 in | Wt 234.0 lb

## 2022-05-20 DIAGNOSIS — R0609 Other forms of dyspnea: Secondary | ICD-10-CM | POA: Diagnosis not present

## 2022-05-20 DIAGNOSIS — J45991 Cough variant asthma: Secondary | ICD-10-CM | POA: Diagnosis not present

## 2022-05-20 DIAGNOSIS — R058 Other specified cough: Secondary | ICD-10-CM

## 2022-05-20 LAB — CBC WITH DIFFERENTIAL/PLATELET
Basophils Absolute: 0 10*3/uL (ref 0.0–0.1)
Basophils Relative: 0.4 % (ref 0.0–3.0)
Eosinophils Absolute: 0.5 10*3/uL (ref 0.0–0.7)
Eosinophils Relative: 4 % (ref 0.0–5.0)
HCT: 37.9 % (ref 36.0–46.0)
Hemoglobin: 12.7 g/dL (ref 12.0–15.0)
Lymphocytes Relative: 29 % (ref 12.0–46.0)
Lymphs Abs: 3.5 10*3/uL (ref 0.7–4.0)
MCHC: 33.7 g/dL (ref 30.0–36.0)
MCV: 94.9 fl (ref 78.0–100.0)
Monocytes Absolute: 0.8 10*3/uL (ref 0.1–1.0)
Monocytes Relative: 6.2 % (ref 3.0–12.0)
Neutro Abs: 7.4 10*3/uL (ref 1.4–7.7)
Neutrophils Relative %: 60.4 % (ref 43.0–77.0)
Platelets: 267 10*3/uL (ref 150.0–400.0)
RBC: 3.99 Mil/uL (ref 3.87–5.11)
RDW: 14 % (ref 11.5–15.5)
WBC: 12.2 10*3/uL — ABNORMAL HIGH (ref 4.0–10.5)

## 2022-05-20 LAB — SEDIMENTATION RATE: Sed Rate: 96 mm/hr — ABNORMAL HIGH (ref 0–30)

## 2022-05-20 LAB — TSH: TSH: 0.78 u[IU]/mL (ref 0.35–5.50)

## 2022-05-20 LAB — BRAIN NATRIURETIC PEPTIDE: Pro B Natriuretic peptide (BNP): 35 pg/mL (ref 0.0–100.0)

## 2022-05-20 LAB — D-DIMER, QUANTITATIVE: D-Dimer, Quant: 0.75 mcg/mL FEU — ABNORMAL HIGH (ref <0.50)

## 2022-05-20 MED ORDER — FAMOTIDINE 20 MG PO TABS: ORAL_TABLET | ORAL | 11 refills | Status: DC

## 2022-05-20 MED ORDER — AMOXICILLIN-POT CLAVULANATE 875-125 MG PO TABS
1.0000 | ORAL_TABLET | Freq: Two times a day (BID) | ORAL | 0 refills | Status: DC
Start: 2022-05-20 — End: 2022-06-19

## 2022-05-20 MED ORDER — METHYLPREDNISOLONE ACETATE 80 MG/ML IJ SUSP
120.0000 mg | Freq: Once | INTRAMUSCULAR | Status: AC
Start: 2022-05-20 — End: 2022-05-20
  Administered 2022-05-20: 120 mg via INTRAMUSCULAR

## 2022-05-20 NOTE — Telephone Encounter (Signed)
I have returned pt phone call. Pt stated she went to the ED 4/24  due to her continuous chest pains after her OV 4/18. ED did a CT which discovered a 5mm nodule on the lungs. Pt had  blood work drawn on 4/24 which came back abnormal with elevated WBC. Pt states chest pain has now moved to her rib cage and lower right side of her back. Pt was highly upset to have came into the office and be told she needed to see a ENT and not having labs done after complaining about her chest pain and cough. Dr. Sherene Sires please view CT and please advise, Thank you

## 2022-05-20 NOTE — Telephone Encounter (Signed)
Pt calling in bc she has been calling since 05/15/2022, Pt has been calling about her Chest still hurting, Sharp pains & that her CT scan shows lung nodule of 5. Pt is very upset because no one has called her back

## 2022-05-20 NOTE — Assessment & Plan Note (Addendum)
Onset in her 64s trigger by URI's sev times a year NO  06/01/2015   =  8 - Spirometry 06/01/2015  No obstruction/ poor effort/ reproducibility  Allergy profile 06/01/2015 >  Eos 0.4 /  IgE  35 neg RAST - trial of singulair 06/01/2015 >>> - Sinus CT 06/07/2015 > No definite sinusitis. Only a minimal amount of debris is noted within the right partition of the sphenoid sinus. - Re CT sinus 05/24/2016 > neg > try gabapentin 100 tid (did not purchase)  - added flutter valve 12/27/2016  - 02/07/2017 rechallenge with gabapentin > could not verify 05/23/2017 that she was taking it - 03/11/2018 added prn doxy x 10 d for flares with purulent sputum  - 05/28/19 sinus surgery / shoemaker  1.  Left endoscopic sinus surgery with intraoperative navigation (fusion) consisting of: Left total ethmoidectomy and left maxillary antrostomy with removal of diseased tissue  2.  Bilateral Inferior Turbinate Reduction   Flared May 02 2022 rx as AB with high dose trelegy augmentin no better then onset of positional > pleurtic R CP  likely mscp/ r/o PE   Rec  Rx as sinusitis/ gerd/ cylical cough no evidence asthma so stop Trelegy and contiue singulair and prn neb since trelegy can aggravate  cough   Of the three most common causes of  Sub-acute / recurrent or chronic cough, only one (GERD)  can actually contribute to/ trigger  the other two (asthma and post nasal drip syndrome)  and perpetuate the cylce of cough.  While not intuitively obvious, many patients with chronic low grade reflux do not cough until there is a primary insult that disturbs the protective epithelial barrier and exposes sensitive nerve endings.   This is typically viral but can due to PNDS and  either may apply here.   The point is that once this occurs, it is difficult to eliminate the cycle  using anything but a maximally effective acid suppression regimen at least in the short run, accompanied by an appropriate diet to address non acid GERD and control /  eliminate the cough itself for at least 3 days with tramadol plus >>> also depomedrol 120 mg IM  in case of component of Th-2 driven upper or lower airways inflammation (if cough responds short term only to relapse before return while will on full rx for uacs (as above), then  that would point to allergic rhinitis/ asthma or eos bronchitis as alternative dx)

## 2022-05-20 NOTE — Assessment & Plan Note (Signed)
Body mass index is 41.45 kg/m.  -  trending down/ congratulated but still a ways to go Lab Results  Component Value Date   TSH 0.78 05/20/2022      Contributing to doe and risk of GERD/PE (ddx for today) >>>   reviewed the need and the process to achieve and maintain neg calorie balance > defer f/u primary care including intermittently monitoring thyroid status            Each maintenance medication was reviewed in detail including emphasizing most importantly the difference between maintenance and prns and under what circumstances the prns are to be triggered using an action plan format where appropriate.  Total time for H and P, chart review, counseling,  and generating customized AVS unique to this office visit / same day charting > 40 min acute office eval

## 2022-05-20 NOTE — Telephone Encounter (Signed)
I am very sorry to hear that - I can see her this week if we have an opening or cancelation but she will need to bring all active meds to sort out the cause of her cough which may be leading to her chest pain as her CT looks fine and the 5 mm nodule is not the cause of any pain but may need f/u in a year

## 2022-05-20 NOTE — Patient Instructions (Addendum)
Stop trelegy and start tart albuterol 2.5 mg with budesonide 0.25 mg twice daily in nebulizer   Augmentin 875 mg take one pill twice daily  X 21 days(7 plus 14)  - take at breakfast and supper with large glass of water.  It would help reduce the usual side effects (diarrhea and yeast infections) if you ate cultured yogurt at lunch.    Take mucinex dm 1200 mg  every 12 hours and as much flutter as your can and supplement if needed with  tramadol 50 mg up to 2 every 4 hours to suppress the urge to cough. Swallowing water and/or using ice chips/non mint and menthol containing candies (such as lifesavers or sugarless jolly ranchers) are also effective.  You should rest your voice and avoid activities that you know make you cough.  Once you have eliminated the cough for 3 straight days try reducing the tramadol first,  then the delsym as tolerated.   Stop levocertasin and start For drainage / throat tickle/ allergies/ night time cough try take CHLORPHENIRAMINE  4 mg  ("Allergy Relief" 4mg   at [facility] [place] should be easiest to find in the blue box usually on bottom shelf)  take one every 4 hours as needed - extremely effective and inexpensive over the counter- may cause drowsiness so start with just a dose or two an hour before bedtime and see how you tolerate it before trying in daytime.  Add pepcid 20 mg about an hour before bedtime with the chlorpheniramine (called  allergy relief at [facility])    Depomedorl 120 mg IM today   Please remember to go to the lab department   for your tests - we will call you with the results when they are available.      Please schedule a follow up office visit in 4 weeks, sooner if needed with all respiratory medications/flutter valve

## 2022-05-20 NOTE — Assessment & Plan Note (Signed)
Onset p covid infection ? Year?   Symptoms are markedly disproportionate to objective findings and not clear to what extent this is actually a pulmonary  problem but pt does appear to have difficult to sort out respiratory symptoms of unknown origin for which  DDX  = almost all start with A and  include Adherence, Ace Inhibitors, Acid Reflux, Active Sinus Disease, Alpha 1 Antitripsin deficiency, Anxiety masquerading as Airways dz,  ABPA,  Allergy(esp in young), Aspiration (esp in elderly), Adverse effects of meds,  Active smoking or Vaping, A bunch of PE's/clot burden (a few small clots can't cause this syndrome unless there is already severe underlying pulm or vascular dz with poor reserve),  Anemia or thyroid disorder, plus two Bs  = Bronchiectasis and Beta blocker use..and one C= CHF    Most likely dx in bold, see also UACS   Chf/ thyroid dz ruled out and PE very unlikely  D dimer   high normal value (seen commonly in the elderly or chronically ill as is the case here)  may miss small peripheral pe, the clot burden with sob is moderately high and her  d dimer  has a very high neg pred value when used in this setting> no CTa indicated here   Adverse drug effects > d/c trelegy for now as may add to UACS symptoms   ? Beta blocker effecfs> unlikely with such low dose coreg.

## 2022-05-20 NOTE — Assessment & Plan Note (Signed)
Onset ? Her 40's 07/31/2015  After extensive coaching HFA effectiveness =    25% > try symbicort 80 2bid and add spacer  - pt off symbicort 05/21/2016 x one month s any discernible airflow obst on spirometry > leave off  - FENO 03/11/2018  =   Could not perform - Spirometry 03/11/2018  FEV1 1.9 (92%)  Ratio 0.93 with nonphysiologic f/v during flare of cough on maint singulair and coreg 12.5 mg bid   - try off trelegy 05/20/2022 as may be aggravating UACS

## 2022-05-20 NOTE — Telephone Encounter (Signed)
Spoke with the pt and added to be seen at 2:30 this afternoon with Dr Sherene Sires.
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
## 2022-05-20 NOTE — Telephone Encounter (Signed)
Pt calling in bc she has been calling since 05/15/2022, Pt has been calling about her Chest still hurting, Sharp pains & that her CT scan shows lung nodule of 5. Pt is very upset because no one has called her back     

## 2022-05-21 ENCOUNTER — Telehealth: Payer: Self-pay | Admitting: Internal Medicine

## 2022-05-21 LAB — IGE: IgE (Immunoglobulin E), Serum: 48 kU/L (ref <=114)

## 2022-05-21 MED ORDER — BUDESONIDE 0.25 MG/2ML IN SUSP: 0.2500 mg | Freq: Two times a day (BID) | RESPIRATORY_TRACT | 11 refills | Status: DC

## 2022-05-21 MED ORDER — ALBUTEROL SULFATE (2.5 MG/3ML) 0.083% IN NEBU: 2.5000 mg | INHALATION_SOLUTION | Freq: Two times a day (BID) | RESPIRATORY_TRACT | 11 refills | Status: DC

## 2022-05-21 NOTE — Telephone Encounter (Signed)
Patient is calling again regarding her medication.  Please update patient asap.  CB# [phone]

## 2022-05-21 NOTE — Telephone Encounter (Signed)
I have sent her albuterol and budesonide to [facility]  Called the pt and left her detailed msg letting her know that this was done

## 2022-05-21 NOTE — Telephone Encounter (Signed)
PT upset [facility] does not have the script for Albuterol.    AVS Notes state:Stop trelegy and start tart albuterol 2.5 mg with budesonide 0.25 mg twice daily in nebulizer   Pls call PT to advise. [phone]

## 2022-05-26 NOTE — Telephone Encounter (Signed)
Pt. Has new encounter this has been resolved

## 2022-05-28 ENCOUNTER — Other Ambulatory Visit: Payer: Self-pay | Admitting: Endocrinology

## 2022-05-28 DIAGNOSIS — E041 Nontoxic single thyroid nodule: Secondary | ICD-10-CM

## 2022-06-03 ENCOUNTER — Telehealth: Payer: Self-pay | Admitting: Internal Medicine

## 2022-06-03 ENCOUNTER — Ambulatory Visit (INDEPENDENT_AMBULATORY_CARE_PROVIDER_SITE_OTHER): Payer: 59 | Admitting: Internal Medicine

## 2022-06-03 ENCOUNTER — Encounter: Payer: Self-pay | Admitting: Internal Medicine

## 2022-06-03 VITALS — BP 130/80 | HR 80 | Ht 63.0 in | Wt 232.0 lb

## 2022-06-03 DIAGNOSIS — R053 Chronic cough: Secondary | ICD-10-CM

## 2022-06-03 DIAGNOSIS — R058 Other specified cough: Secondary | ICD-10-CM | POA: Diagnosis not present

## 2022-06-03 DIAGNOSIS — K219 Gastro-esophageal reflux disease without esophagitis: Secondary | ICD-10-CM

## 2022-06-03 DIAGNOSIS — J323 Chronic sphenoidal sinusitis: Secondary | ICD-10-CM

## 2022-06-03 NOTE — Progress Notes (Signed)
[name]    [id number]    1960-09-23  Primary Care Physician:Boyd, Leanora Cover, MD Date of Appointment: 06/03/2022 Established Patient Visit  Chief complaint:   Chief Complaint  Patient presents with   Acute Visit    Pt states she has had chest discomfort, coughing up phlegm that is white in color, and has some wheezing. Pt also states she has some SOB associated with it. Pt said she has had sharp pains in her chest which she said has been going on since her very first office visit with the office and has not stopped.     HPI: [name] is a 62 y.o. woman with past medical history of chronic cough.    Interval Updates: Here for persistent cough and chest tightness and chest burning.   Was given augmentin for cough. Without improvement.  Has also taken delsym cough syrup and mucinex DM and allergy pills (singulair and   Has been on budesonide nebulizer treatments for about a month and they don't seem to be helping.   Takes tramadol for pain but it makes her sleepy without improving her pain. Pain worse with coughing and breathing in.   Prednisone also has not helped her cough.   Was taken off nasal sprays, was making things worse.   Cough and chest tightness worse with laying down. Wakes her up from sleep. Takes dexilant in the morning and pepcid at night.   No fevers or chills, cough is non productive. No dysuria, hematuria.   I have reviewed the patient's family social and past medical history and updated as appropriate.   Past Medical History:  Diagnosis Date   Acute on chronic diastolic CHF (congestive heart failure) (HCC) 04/11/2014   Acute suppurative otitis media without spontaneous rupture of eardrum 02/04/2007   Centricity Description: OTITIS MEDIA, SUPPURATIVE, ACUTE, BILATERAL Qualifier: Diagnosis of  By: Everardo All MD, Cleophas Dunker  Centricity Description: OTITIS MEDIA, SUPPURATIVE, ACUTE Qualifier: Diagnosis of  By: Everardo All MD, Sean A     Allergic rhinitis    Allergy    Anemia    Anxiety    Arthritis    Atherosclerosis of aorta (HCC) 02/21/2021   CAD (coronary artery disease)    LHC (11/29/1999):  EF 60%; no significant CAD.   Carpal tunnel syndrome    Carpal tunnel syndrome 06/09/2006   Qualifier: Diagnosis of  By: Charlsie Quest RMA, Lucy     Chronic diastolic CHF (congestive heart failure) (HCC)    a. Echo (01/21/11): Vigorous LVF, EF 65-70%, no RWMA, Gr 2 DD. b.  Echo (12/15): Moderate LVH, EF 60-65%, grade 2 diastolic dysfunction, mild LAE, normal RV function c. 01/2015: echo showing EF of 60-65% with mild LVH   Common migraine    COPD (chronic obstructive pulmonary disease) (HCC)    COVID-19    x 2   Diastolic heart failure (HCC)    Diverticulitis    Dyspnea    Hepatic steatosis    Hepatomegaly    Hiatal hernia    Hyperlipidemia    IBS (irritable bowel syndrome)    Internal hemorrhoids    Long COVID    Metabolic syndrome    Morbid obesity (HCC)    PNEUMONIA, ORGAN UNSPECIFIED 03/01/2009   Qualifier: Diagnosis of  By: Yancey Flemings CMA, Jennifer     Sinusitis, chronic 03/10/2012   CT sinuses 02/2012:  Chronic sinusitis with small A/F levels >> rx by ENT with prolonged augmentin F/u ct sinuses 04/2012:  Persistent sinus thickening >> rx with levaquin and clinda for 30 days.  Told to return for sinus scan in 6 weeks >> no showed for followup visit.  CT sinuses 11/2013:  Acute and chronic sinusitis noted.      Past Surgical History:  Procedure Laterality Date   ABDOMINAL HYSTERECTOMY     BIOPSY  06/22/2018   Procedure: BIOPSY;  Surgeon: Napoleon Form, MD;  Location: [facility];  Service: Endoscopy;;   BIOPSY  07/28/2018   Procedure: BIOPSY;  Surgeon: Napoleon Form, MD;  Location: [facility];  Service: Endoscopy;;   CESAREAN SECTION     COLONOSCOPY     COLONOSCOPY WITH PROPOFOL N/A 05/05/2017   Procedure: COLONOSCOPY WITH PROPOFOL;  Surgeon: Napoleon Form, MD;  Location: [facility];  Service: Endoscopy;   Laterality: N/A;   COLONOSCOPY WITH PROPOFOL N/A 01/07/2022   Procedure: COLONOSCOPY WITH PROPOFOL;  Surgeon: Napoleon Form, MD;  Location: [facility];  Service: Gastroenterology;  Laterality: N/A;   ESOPHAGOGASTRODUODENOSCOPY (EGD) WITH PROPOFOL N/A 06/22/2018   Procedure: ESOPHAGOGASTRODUODENOSCOPY (EGD) WITH PROPOFOL;  Surgeon: Napoleon Form, MD;  Location: [facility];  Service: Endoscopy;  Laterality: N/A;   ESOPHAGOGASTRODUODENOSCOPY (EGD) WITH PROPOFOL N/A 07/28/2018   Procedure: ESOPHAGOGASTRODUODENOSCOPY (EGD) WITH PROPOFOL;  Surgeon: Napoleon Form, MD;  Location: [facility];  Service: Endoscopy;  Laterality: N/A;   ESOPHAGOGASTRODUODENOSCOPY (EGD) WITH PROPOFOL N/A 01/07/2022   Procedure: ESOPHAGOGASTRODUODENOSCOPY (EGD) WITH PROPOFOL;  Surgeon: Napoleon Form, MD;  Location: [facility];  Service: Gastroenterology;  Laterality: N/A;   NASAL SINUS SURGERY     PARTIAL HYSTERECTOMY     POLYPECTOMY  01/07/2022   Procedure: POLYPECTOMY;  Surgeon: Napoleon Form, MD;  Location: [facility];  Service: Gastroenterology;;   SINUS ENDO WITH FUSION Left 05/28/2019   Procedure: SINUS ENDO WITH FUSION;  Surgeon: Osborn Coho, MD;  Location: [facility] OR;  Service: ENT;  Laterality: Left;   TONSILLECTOMY     TUBAL LIGATION     TURBINATE REDUCTION Bilateral 05/28/2019   Procedure: TURBINATE REDUCTION;  Surgeon: Osborn Coho, MD;  Location: [facility] OR;  Service: ENT;  Laterality: Bilateral;   UVULOPALATOPHARYNGOPLASTY      Family History  Problem Relation Age of Onset   Heart attack Mother 60       1st one @ 32, 2nd one 58   Heart disease Mother    Hypertension Mother    Asthma Sister    Colon polyps Sister    Diabetes Sister    Diabetes Sister    Diabetes Brother    Diabetes Brother    Diabetes Brother    Diabetes Brother    Diabetes Half-Sister    Stroke Neg Hx    Colon cancer Neg Hx    Esophageal cancer Neg Hx    Rectal cancer Neg Hx    Stomach cancer Neg  Hx     Social History   Occupational History   Occupation: Retired  Tobacco Use   Smoking status: Never   Smokeless tobacco: Never  Vaping Use   Vaping Use: Never used  Substance and Sexual Activity   Alcohol use: No   Drug use: No   Sexual activity: Not on file     Physical Exam: Blood pressure 130/80, pulse 80, height 5\' 3"  (1.6 m), weight 232 lb (105.2 kg), SpO2 100 %.  Gen:      No acute distress, frequent dry cough ENT:  cobblestoning in oropharynx, mallampati IV, no nasal polyps, mucus membranes moist Lungs:  No increased respiratory effort, symmetric chest wall excursion, clear to auscultation bilaterally, no wheezes or crackles CV:         Regular rate and rhythm; no murmurs, rubs, or gallops.  No pedal edema   Data Reviewed: Imaging: I have personally reviewed the CTPE study and CT Chest without contrast April 2024 - no PE in 2023 and CT Chest in April 2024 shows 5mm nodule and no acute findings.  CT Sinus Feb 2021 shows phenoid and maxillary sinuses.   PFTs:   Labs: Lab Results  Component Value Date   NA 137 05/15/2022   K 4.1 05/15/2022   CO2 22 05/15/2022   GLUCOSE 93 05/15/2022   BUN 23 05/15/2022   CREATININE 0.87 05/15/2022   CALCIUM 8.7 (L) 05/15/2022   EGFR 71 12/10/2021   GFRNONAA >60 05/15/2022   Lab Results  Component Value Date   WBC 12.2 (H) 05/20/2022   HGB 12.7 05/20/2022   HCT 37.9 05/20/2022   MCV 94.9 05/20/2022   PLT 267.0 05/20/2022    Immunization status: Immunization History  Administered Date(s) Administered   Covid-19, Mrna,Vaccine(Spikevax)8yrs and older 12/19/2021   Influenza Split 11/21/2013, 11/21/2015, 10/21/2016   Influenza,inj,Quad PF,6+ Mos 02/23/2015, 11/21/2015, 11/11/2018   Influenza-Unspecified 11/25/2013, 02/23/2015, 10/21/2016, 11/11/2018   PFIZER Comirnaty(Gray Top)Covid-19 Tri-Sucrose Vaccine 05/17/2020   PFIZER(Purple Top)SARS-COV-2 Vaccination 09/28/2019, 11/03/2019, 05/17/2020   Pfizer Covid-19  Vaccine Bivalent Booster 29yrs & up 12/06/2020   Pneumococcal Polysaccharide-23 10/30/2005, 11/23/2015   Td 08/30/2005    External Records Personally Reviewed: pulmonary, ED  Assessment:  Chronic Cough UACS GERD Post nasal drainage 5mm pulmonary nodule - noted on CT Chest April 2024  Plan/Recommendations: I think your chronic cough 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  Keep taking dexilant and pepcid. Follow up your gastroenterologist. I recommend sleeping with a wedge pillow at night  Clinically no evidence of pneumonia.   I do not recommend additional antibiotics or inhalers as they have not been helpful to you.nasal sprays and anti-histmains like zyrtec/cetirizine can also be helpful. Chlorpheniramine is also available over the counter, twice daily, can help with the drainage contributing to cough.   You can try warm compress, massage, tylenol, salonpas patches, bengay or sports rubs for the chest pain related to coughing.   She is a non smoker and a low risk patient based on fleischner society criteria for nodules. No further follow up needed.   I spent a large amount of time explaining to her that I'm reassured by her work up thus far and my clinical suspicion for infection is low. She remained insistent on blood work to demonstrate if she had an infection.   I spent 30 minutes on 06/03/2022 in care of this patient including face to face time and non-face to face time spent charting, review of outside records, and coordination of care.  Return to Care: Return if symptoms worsen or fail to improve.   Durel Salts, MD Pulmonary and Critical Care Medicine [facility] [phone]

## 2022-06-03 NOTE — Telephone Encounter (Signed)
Spoke with patient she advises the Augmentin has not helped and there has been no improvement since last seeing Dr. Sherene Sires. Patient complains of coughing, chest pain that is worse when taking a deep breath.   Dr. Sherene Sires please advise?

## 2022-06-03 NOTE — Telephone Encounter (Signed)
States if she does not get an answer in 3 hours she is going to hospital. Chest pain is getting sharper w/deep breaths.

## 2022-06-03 NOTE — Patient Instructions (Addendum)
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  Keep taking dexilant and pepcid. Follow up your gastroenterologist. I recommend sleeping with a wedge pillow at night  Clinically no evidence of pneumonia.   I do not recommend additional antibiotics or inhalers as they have not been helpful to you.nasal sprays and anti-histmains like zyrtec/cetirizine can also be helpful. Chlorpheniramine is also available over the counter, twice daily, can help with the drainage contributing to cough.   You can try warm compress, massage, tylenol, salonpas patches, bengay or sports rubs for the chest pain related to coughing.     What is GERD? Gastroesophageal reflux disease (GERD) is gastroesophageal reflux diseasewhich occurs when the lower esophageal sphincter (LES) opens spontaneously, for varying periods of time, or does not close properly and stomach contents rise up into the esophagus. GER is also called acid reflux or acid regurgitation, because digestive juices--called acids--rise up with the food. The esophagus is the tube that carries food from the mouth to the stomach. The LES is a ring of muscle at the bottom of the esophagus that acts like a valve between the esophagus and stomach.  When acid reflux occurs, food or fluid can be tasted in the back of the mouth. When refluxed stomach acid touches the lining of the esophagus it may cause a burning sensation in the chest or throat called heartburn or acid indigestion. Occasional reflux is common. Persistent reflux that occurs more than twice a week is considered GERD, and it can eventually lead to more serious health problems. People of all ages can have GERD. Studies have shown that GERD may worsen or contribute to asthma, chronic cough, and pulmonary fibrosis.   What are the symptoms of GERD? The main symptom of GERD in adults is frequent heartburn, also called acid indigestion--burning-type pain in the lower part  of the mid-chest, behind the breast bone, and in the mid-abdomen.  Not all reflux is acidic in nature, and many patients don't have heart burn at all. Sometimes it feels like a cough (either dry or with mucus), choking sensation, asthma, shortness of breath, waking up at night, frequent throat clearing, or trouble swallowing.    What causes GERD? The reason some people develop GERD is still unclear. However, research shows that in people with GERD, the LES relaxes while the rest of the esophagus is working. Anatomical abnormalities such as a hiatal hernia may also contribute to GERD. A hiatal hernia occurs when the upper part of the stomach and the LES move above the diaphragm, the muscle wall that separates the stomach from the chest. Normally, the diaphragm helps the LES keep acid from rising up into the esophagus. When a hiatal hernia is present, acid reflux can occur more easily. A hiatal hernia can occur in people of any age and is most often a normal finding in otherwise healthy people over age 35. Most of the time, a hiatal hernia produces no symptoms.   Other factors that may contribute to GERD include - Obesity or recent weight gain - Pregnancy  - Smoking  - Diet - Certain medications  Common foods that can worsen reflux symptoms include: - carbonated beverages - artificial sweeteners - citrus fruits  - chocolate  - drinks with caffeine or alcohol  - fatty and fried foods  - garlic and onions  - mint flavorings  - spicy foods  - tomato-based foods, like spaghetti sauce, salsa, chili, and pizza   Lifestyle Changes  If you smoke, stop.  Avoid foods and beverages that worsen symptoms (see above.) Lose weight if needed.  Eat small, frequent meals.  Wear loose-fitting clothes.  Avoid lying down for 3 hours after a meal.  Raise the head of your bed 6 to 8 inches by securing wood blocks under the bedposts. Just using extra pillows will not help, but using a wedge-shaped pillow may be  helpful.  Medications  H2 blockers, such as cimetidine (Tagamet HB), famotidine (Pepcid AC), nizatidine (Axid AR), and ranitidine (Zantac 75), decrease acid production. They are available in prescription strength and over-the-counter strength. These drugs provide short-term relief and are effective for about half of those who have GERD symptoms.  Proton pump inhibitors include omeprazole (Prilosec, Zegerid), lansoprazole (Prevacid), pantoprazole (Protonix), rabeprazole (Aciphex), and esomeprazole (Nexium), which are available by prescription. Prilosec is also available in over-the-counter strength. Proton pump inhibitors are more effective than H2 blockers and can relieve symptoms and heal the esophageal lining in almost everyone who has GERD.  Because drugs work in different ways, combinations of medications may help control symptoms. People who get heartburn after eating may take both antacids and H2 blockers. The antacids work first to neutralize the acid in the stomach, and then the H2 blockers act on acid production. By the time the antacid stops working, the H2 blocker will have stopped acid production. Your health care provider is the best source of information about how to use medications for GERD.   Points to Remember 1. You can have GERD without having heartburn. Your symptoms could include a dry cough, asthma symptoms, or trouble swallowing.  2. Taking medications daily as prescribed is important in controlling you symptoms.  Sometimes it can take up to 8 weeks to fully achieve the effects of the medications prescribed.  3. Coughing related to GERD can be difficult to treat and is very frustrating!  However, it is important to stick with these medications and lifestyle modifications before pursuing more aggressive or invasive test and treatments.

## 2022-06-03 NOTE — Telephone Encounter (Signed)
Closing encounter pt has been scheduled with Dr. Celine Mans

## 2022-06-03 NOTE — Telephone Encounter (Signed)
PT states Dr. Sherene Sires wanted her to let him know if the Agumentin he RX'd was working. She states it is not and she feels like she is getting worse.  Pls call @ [phone]   TY

## 2022-06-03 NOTE — Telephone Encounter (Signed)
Sorry - I Can't help her s ov with all meds in hand

## 2022-06-04 ENCOUNTER — Other Ambulatory Visit: Payer: 59

## 2022-06-05 ENCOUNTER — Telehealth: Payer: Self-pay | Admitting: Gastroenterology

## 2022-06-05 NOTE — Telephone Encounter (Signed)
Inbound call from pt requesting a call back from a nurse.patient is looking to schedule an appt with Dr.Nandigam as soon as possible per her PCP indications.she said her medication for her acid reflux its causing her coughing more than use to and also a lot of mucus discharged from the throat.Please advise.

## 2022-06-05 NOTE — Telephone Encounter (Signed)
Patient has seen pulmonology and then ENT. Her understanding of her constant cough and sinus drainage is that her reflux remains uncontrolled. She states, "I am on the best medications for my reflux. The doctor mentioned that I may need surgery and close up that place" referring to the hernia.  Patient has continued her weight loss journey. She is asking about steps to take care of her reflux.

## 2022-06-06 NOTE — Telephone Encounter (Signed)
Patient notified. She states she has the information on reflux and she is following it. Appointment scheduled for 07/31/22 at 3:30 pm.

## 2022-06-06 NOTE — Telephone Encounter (Signed)
Please send her handout on antireflux measures and schedule follow up visit with me or APP next available appt. Thanks

## 2022-06-11 ENCOUNTER — Other Ambulatory Visit: Payer: Self-pay | Admitting: Gastroenterology

## 2022-06-16 ENCOUNTER — Other Ambulatory Visit: Payer: Self-pay | Admitting: Cardiology

## 2022-06-16 DIAGNOSIS — I5032 Chronic diastolic (congestive) heart failure: Secondary | ICD-10-CM

## 2022-06-16 DIAGNOSIS — I10 Essential (primary) hypertension: Secondary | ICD-10-CM

## 2022-06-19 ENCOUNTER — Encounter: Payer: Self-pay | Admitting: Internal Medicine

## 2022-06-19 ENCOUNTER — Ambulatory Visit (INDEPENDENT_AMBULATORY_CARE_PROVIDER_SITE_OTHER): Payer: 59 | Admitting: Internal Medicine

## 2022-06-19 ENCOUNTER — Ambulatory Visit: Payer: 59 | Admitting: Internal Medicine

## 2022-06-19 VITALS — BP 124/74 | HR 60 | Temp 98.1°F | Ht 63.0 in | Wt 228.0 lb

## 2022-06-19 DIAGNOSIS — R058 Other specified cough: Secondary | ICD-10-CM

## 2022-06-19 DIAGNOSIS — J45991 Cough variant asthma: Secondary | ICD-10-CM

## 2022-06-19 MED ORDER — PREDNISONE 10 MG PO TABS: ORAL_TABLET | ORAL | 0 refills | Status: DC

## 2022-06-19 NOTE — Addendum Note (Signed)
Addended byClyda Greener M on: 06/19/2022 02:41 PM   Modules accepted: Orders

## 2022-06-19 NOTE — Assessment & Plan Note (Signed)
Onset ? Her 40's 07/31/2015  After extensive coaching HFA effectiveness =    25% > try symbicort 80 2bid and add spacer  - pt off symbicort 05/21/2016 x one month s any discernible airflow obst on spirometry > leave off  - FENO 03/11/2018  =   Could not perform - Spirometry 03/11/2018  FEV1 1.9 (92%)  Ratio 0.93 with nonphysiologic f/v during flare of cough on maint singulair and coreg 12.5 mg bid   - try off trelegy 05/20/2022 as may be aggravating UACS  - 06/19/2022 reports pred helps cough/ breathing so rec bud 0.25/albuterol 2.5 mg qid and hold all inhalers as they may trigger UACS masquerading as asthma         Each maintenance medication was reviewed in detail including emphasizing most importantly the difference between maintenance and prns and under what circumstances the prns are to be triggered using an action plan format where appropriate.  Total time for H and P, chart review, counseling, reviewing neb device(s) and generating customized AVS unique to this office visit / same day charting => 30 min for multiple  refractory respiratory  symptoms of uncertain etiology
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
## 2022-06-19 NOTE — Assessment & Plan Note (Signed)
Onset in her 60s trigger by URI's sev times a year NO  06/01/2015   =  8 - Spirometry 06/01/2015  No obstruction/ poor effort/ reproducibility  Allergy profile 06/01/2015 >  Eos 0.4 /  IgE  35 neg RAST - trial of singulair 06/01/2015 >>> - Sinus CT 06/07/2015 > No definite sinusitis. Only a minimal amount of debris is noted within the right partition of the sphenoid sinus. - Re CT sinus 05/24/2016 > neg > try gabapentin 100 tid (did not purchase)  - added flutter valve 12/27/2016  - 02/07/2017 rechallenge with gabapentin > could not verify 05/23/2017 that she was taking it - 03/11/2018 added prn doxy x 10 d for flares with purulent sputum  - 05/28/19 sinus surgery / shoemaker  1.  Left endoscopic sinus surgery with intraoperative navigation (fusion) consisting of: Left total ethmoidectomy and left maxillary antrostomy with removal of diseased tissue  2.  Bilateral Inferior Turbinate Reduction  05/20/22  IgE 48/  Eos 0.5 - [facility] endoscopy 06/05/22  nl post op changes   She cannot tolerate gabapentin so nothing else to offer fro this office >>> Referred to [facility]/ Dr Delford Field due to voice fatigue/ globus 06/19/2022

## 2022-06-19 NOTE — Patient Instructions (Signed)
Increased the breathing treatments to 4 x daily and keep appt with DR Craige Cotta   Prednisone 10 mg take  4 each am x 2 days,   2 each am x 2 days,  1 each am x 2 days and stop   Tessalon can be used up to 4 x daily   My office will be contacting you by phone for referral to Dr Delford Field at [facility] / voice center  - if you don't hear back from my office within one week please call us back or notify us thru MyChart and we'll address it right away.   Follow up in this clinic is as needed

## 2022-06-20 ENCOUNTER — Telehealth: Payer: Self-pay | Admitting: Internal Medicine

## 2022-06-20 NOTE — Telephone Encounter (Signed)
Pt calling in because [facility] needs the script sent as a fax not throught the [facility]

## 2022-06-25 ENCOUNTER — Encounter: Payer: Self-pay | Admitting: *Deleted

## 2022-06-25 MED ORDER — BUDESONIDE 0.25 MG/2ML IN SUSP: 0.2500 mg | Freq: Four times a day (QID) | RESPIRATORY_TRACT | 3 refills | Status: DC

## 2022-06-25 MED ORDER — ALBUTEROL SULFATE (2.5 MG/3ML) 0.083% IN NEBU: 2.5000 mg | INHALATION_SOLUTION | Freq: Four times a day (QID) | RESPIRATORY_TRACT | 3 refills | Status: DC

## 2022-06-25 NOTE — Telephone Encounter (Signed)
Rxs sent to [facility] pharm  Mychart msg to the pt to let her know that this was done

## 2022-07-29 ENCOUNTER — Ambulatory Visit
Admission: RE | Admit: 2022-07-29 | Discharge: 2022-07-29 | Disposition: A | Discharge status: Home / Self Care | Payer: 59 | Source: Ambulatory Visit | Attending: Endocrinology | Admitting: Endocrinology

## 2022-07-29 DIAGNOSIS — E041 Nontoxic single thyroid nodule: Secondary | ICD-10-CM

## 2022-07-31 ENCOUNTER — Ambulatory Visit (INDEPENDENT_AMBULATORY_CARE_PROVIDER_SITE_OTHER): Payer: 59 | Admitting: Gastroenterology

## 2022-07-31 ENCOUNTER — Encounter: Payer: Self-pay | Admitting: Gastroenterology

## 2022-07-31 VITALS — BP 98/68 | HR 56 | Ht 63.0 in | Wt 227.5 lb

## 2022-07-31 DIAGNOSIS — K2100 Gastro-esophageal reflux disease with esophagitis, without bleeding: Secondary | ICD-10-CM | POA: Diagnosis not present

## 2022-07-31 DIAGNOSIS — R1013 Epigastric pain: Secondary | ICD-10-CM

## 2022-07-31 DIAGNOSIS — R0609 Other forms of dyspnea: Secondary | ICD-10-CM

## 2022-07-31 DIAGNOSIS — G8929 Other chronic pain: Secondary | ICD-10-CM

## 2022-07-31 MED ORDER — VOQUEZNA 20 MG PO TABS: 1.0000 | ORAL_TABLET | Freq: Every day | ORAL | 0 refills | Status: DC

## 2022-07-31 MED ORDER — FAMOTIDINE 20 MG PO TABS
ORAL_TABLET | ORAL | 11 refills | Status: DC
Start: 2022-07-31 — End: 2023-08-28

## 2022-07-31 NOTE — Progress Notes (Unsigned)
[name]    [id number]    1960-08-27  Primary Care Physician:Boyd, Leanora Cover, MD  Referring Physician: Verlon Au, MD [address] [place],  [place] [address]   Chief complaint:  GERD, cough, Abd pain  HPI:  62 year old very pleasant female with history of obesity, obstructive sleep apnea, COPD here for follow-up visit for worsening GERD symptoms.   She is having intermittent generalized abdominal bloating, excess gas and abdominal discomfort in the epigastric and left upper quadrant   She feels her GERD symptoms are not under control, she is having more nocturnal symptoms and also has excessive cough with regurgitation and mucus.  She is taking Dexilant 60 mg daily and also Pepcid at bedtime.  She has lost significant weight with dietary changes and exercise in the past 1 to 2 years.  Denies any dysphagia, vomiting,any rectal bleeding or melena.   Relevant GI history: EGD January 07, 2022 - Z- line regular, 38 cm from the incisors. - LA Grade B reflux esophagitis with no bleeding. - 4 cm hiatal hernia. - Erosive gastropathy  Colonoscopy January 07, 2022 - One 5 mm polyp in the sigmoid colon [hyperplastic], removed with a cold snare. Resected and retrieved. - Non- bleeding  EGD 12/09/18 - The esophagus was normal. - A large amount of a phytobezoar was found in the gastric body with no evidence of gastric outlet obstruction. Limited visualization of the gastric mucosa - The examined duodenum was normal.   EGD July 28, 2018: Few superficial linear nonbleeding gastric ulcers in the antrum and gastritis.  Benign fundic gland polyps.  Esophagus and duodenum was normal.   EGD June 22, 2018 with moderate amount of residue food in the body and fundus of stomach with limited visualization of the lining, gastritis, biopsies negative for H. pylori   Gastric emptying scan July 08, 2018: Normal   CT abdomen pelvis March 30, 2018: No acute  intra-abdominal or pelvic abnormality, moderate fat-containing umbilical hernia   Colonoscopy April 2019 with removal of 11 mm hamartomatous polyp in sigmoid colon   Colonoscopy November 04, 2003 normal with no polyps     Outpatient Encounter Medications as of 07/31/2022  Medication Sig   acetaminophen (TYLENOL) 650 MG CR tablet Take 650 mg by mouth every 8 (eight) hours as needed for pain (headache).   albuterol (PROVENTIL) (2.5 MG/3ML) 0.083% nebulizer solution Take 3 mLs (2.5 mg total) by nebulization in the morning, at noon, in the evening, and at bedtime.   benzonatate (TESSALON) 200 MG capsule Take 200 mg by mouth 3 (three) times daily.   budesonide (PULMICORT) 0.25 MG/2ML nebulizer solution Take 2 mLs (0.25 mg total) by nebulization in the morning, at noon, in the evening, and at bedtime.   carvedilol (COREG) 12.5 MG tablet Take 0.5 tablets (6.25 mg total) by mouth daily.   dexlansoprazole (DEXILANT) 60 MG capsule TAKE 1 CAPSULE (60 MG TOTAL) BY MOUTH DAILY. DAILY IN THE MORNING   Dextromethorphan-guaiFENesin (MUCINEX DM MAXIMUM STRENGTH) 60-1200 MG TB12 Take 1 tablet by mouth 2 (two) times daily as needed.   dicyclomine (BENTYL) 10 MG capsule Take 1 capsule (10 mg total) by mouth every 8 (eight) hours as needed for spasms. (Patient taking differently: Take 10 mg by mouth 4 (four) times daily as needed for spasms. And at bedtime as needed)   EPINEPHrine 0.3 mg/0.3 mL IJ SOAJ injection Inject 0.3 mg into the muscle as  needed for anaphylaxis.   ergocalciferol (VITAMIN D2) 1.25 MG (50000 UT) capsule Take 50,000 Units by mouth once a week. Fridays   famotidine (PEPCID) 20 MG tablet One after supper   KLOR-CON M20 20 MEQ tablet TAKE 1 TABLET BY MOUTH AS DIRECTED. 1 TABLET DAILY. TAKE EXTRA TAB WITH TORSEMIDE EXTRA DOSE   LINZESS 290 MCG CAPS capsule TAKE 1 CAPSULE BY MOUTH EVERY DAY BEFORE BREAKFAST   montelukast (SINGULAIR) 10 MG tablet TAKE 1 TABLET(10 MG) BY MOUTH AT BEDTIME   Respiratory  Therapy Supplies (FLUTTER) DEVI 1 application by Does not apply route as directed.   rosuvastatin (CRESTOR) 10 MG tablet Take 10 mg by mouth daily.   spironolactone (ALDACTONE) 25 MG tablet TAKE 1 TABLET BY MOUTH EVERY DAY IN THE MORNING   topiramate (TOPAMAX) 25 MG tablet Take 25 mg by mouth 2 (two) times daily.   [DISCONTINUED] albuterol (PROVENTIL) (2.5 MG/3ML) 0.083% nebulizer solution Take 3 mLs (2.5 mg total) by nebulization 2 (two) times daily. (Patient not taking: Reported on 07/31/2022)   [DISCONTINUED] budesonide (PULMICORT) 0.25 MG/2ML nebulizer solution Take 2 mLs (0.25 mg total) by nebulization in the morning and at bedtime. (Patient not taking: Reported on 07/31/2022)   [DISCONTINUED] predniSONE (DELTASONE) 10 MG tablet Take  4 each am x 2 days,   2 each am x 2 days,  1 each am x 2 days and stop (Patient not taking: Reported on 07/31/2022)   [DISCONTINUED] sucralfate (CARAFATE) 1 g tablet Take 1 tablet (1 g total) by mouth 2 (two) times daily as needed (stomach issues). (Patient not taking: Reported on 07/31/2022)   No facility-administered encounter medications on file as of 07/31/2022.    Allergies as of 07/31/2022 - Review Complete 07/31/2022  Allergen Reaction Noted   Sulfonamide derivatives Anaphylaxis, Swelling, Rash, and Other (See Comments) 10/21/2006   Doxycycline Nausea And Vomiting 01/05/2008   Latex Hives 09/28/2011   Ciprofloxacin Rash 10/21/2006   Fish allergy Rash and Other (See Comments) 03/08/2012   Hydrocortisone Rash    Neomycin Rash 10/21/2006    Past Medical History:  Diagnosis Date   Acute on chronic diastolic CHF (congestive heart failure) (HCC) 04/11/2014   Acute suppurative otitis media without spontaneous rupture of eardrum 02/04/2007   Centricity Description: OTITIS MEDIA, SUPPURATIVE, ACUTE, BILATERAL Qualifier: Diagnosis of  By: Everardo All MD, Cleophas Dunker  Centricity Description: OTITIS MEDIA, SUPPURATIVE, ACUTE Qualifier: Diagnosis of  By: Everardo All MD, Sean A     Allergic rhinitis    Allergy    Anemia    Anxiety    Arthritis    Atherosclerosis of aorta (HCC) 02/21/2021   CAD (coronary artery disease)    LHC (11/29/1999):  EF 60%; no significant CAD.   Carpal tunnel syndrome    Carpal tunnel syndrome 06/09/2006   Qualifier: Diagnosis of  By: Charlsie Quest RMA, Lucy     Chronic diastolic CHF (congestive heart failure) (HCC)    a. Echo (01/21/11): Vigorous LVF, EF 65-70%, no RWMA, Gr 2 DD. b.  Echo (12/15): Moderate LVH, EF 60-65%, grade 2 diastolic dysfunction, mild LAE, normal RV function c. 01/2015: echo showing EF of 60-65% with mild LVH   Common migraine    COPD (chronic obstructive pulmonary disease) (HCC)    COVID-19    x 2   Diastolic heart failure (HCC)    Diverticulitis    Dyspnea    Hepatic steatosis    Hepatomegaly    Hiatal hernia    Hyperlipidemia    IBS (irritable bowel  syndrome)    Internal hemorrhoids    Long COVID    Metabolic syndrome    Morbid obesity (HCC)    PNEUMONIA, ORGAN UNSPECIFIED 03/01/2009   Qualifier: Diagnosis of  By: Yancey Flemings CMA, Jennifer     Sinusitis, chronic 03/10/2012   CT sinuses 02/2012:  Chronic sinusitis with small A/F levels >> rx by ENT with prolonged augmentin F/u ct sinuses 04/2012:  Persistent sinus thickening >> rx with levaquin and clinda for 30 days.  Told to return for sinus scan in 6 weeks >> no showed for followup visit.  CT sinuses 11/2013:  Acute and chronic sinusitis noted.      Past Surgical History:  Procedure Laterality Date   ABDOMINAL HYSTERECTOMY     BIOPSY  06/22/2018   Procedure: BIOPSY;  Surgeon: Napoleon Form, MD;  Location: [facility];  Service: Endoscopy;;   BIOPSY  07/28/2018   Procedure: BIOPSY;  Surgeon: Napoleon Form, MD;  Location: [facility];  Service: Endoscopy;;   CESAREAN SECTION     COLONOSCOPY     COLONOSCOPY WITH PROPOFOL N/A 05/05/2017   Procedure: COLONOSCOPY WITH PROPOFOL;  Surgeon: Napoleon Form, MD;  Location: [facility];  Service:  Endoscopy;  Laterality: N/A;   COLONOSCOPY WITH PROPOFOL N/A 01/07/2022   Procedure: COLONOSCOPY WITH PROPOFOL;  Surgeon: Napoleon Form, MD;  Location: [facility];  Service: Gastroenterology;  Laterality: N/A;   ESOPHAGOGASTRODUODENOSCOPY (EGD) WITH PROPOFOL N/A 06/22/2018   Procedure: ESOPHAGOGASTRODUODENOSCOPY (EGD) WITH PROPOFOL;  Surgeon: Napoleon Form, MD;  Location: [facility];  Service: Endoscopy;  Laterality: N/A;   ESOPHAGOGASTRODUODENOSCOPY (EGD) WITH PROPOFOL N/A 07/28/2018   Procedure: ESOPHAGOGASTRODUODENOSCOPY (EGD) WITH PROPOFOL;  Surgeon: Napoleon Form, MD;  Location: [facility];  Service: Endoscopy;  Laterality: N/A;   ESOPHAGOGASTRODUODENOSCOPY (EGD) WITH PROPOFOL N/A 01/07/2022   Procedure: ESOPHAGOGASTRODUODENOSCOPY (EGD) WITH PROPOFOL;  Surgeon: Napoleon Form, MD;  Location: [facility];  Service: Gastroenterology;  Laterality: N/A;   NASAL SINUS SURGERY     PARTIAL HYSTERECTOMY     POLYPECTOMY  01/07/2022   Procedure: POLYPECTOMY;  Surgeon: Napoleon Form, MD;  Location: [facility];  Service: Gastroenterology;;   SINUS ENDO WITH FUSION Left 05/28/2019   Procedure: SINUS ENDO WITH FUSION;  Surgeon: Osborn Coho, MD;  Location: [facility] OR;  Service: ENT;  Laterality: Left;   TONSILLECTOMY     TUBAL LIGATION     TURBINATE REDUCTION Bilateral 05/28/2019   Procedure: TURBINATE REDUCTION;  Surgeon: Osborn Coho, MD;  Location: [facility] OR;  Service: ENT;  Laterality: Bilateral;   UVULOPALATOPHARYNGOPLASTY      Family History  Problem Relation Age of Onset   Heart attack Mother 54       1st one @ 55, 2nd one 95   Heart disease Mother    Hypertension Mother    Asthma Sister    Colon polyps Sister    Diabetes Sister    Diabetes Sister    Diabetes Brother    Lung cancer Brother    Diabetes Brother    Diabetes Brother    Diabetes Brother    Diabetes Half-Sister    Stroke Neg Hx    Colon cancer Neg Hx    Esophageal cancer Neg Hx    Rectal  cancer Neg Hx    Stomach cancer Neg Hx    Pancreatic cancer Neg Hx     Social History   Socioeconomic History   Marital status: Widowed    Spouse name: Not on file   Number  of children: 2   Years of education: Not on file   Highest education level: Not on file  Occupational History   Occupation: Retired  Tobacco Use   Smoking status: Never   Smokeless tobacco: Never  Vaping Use   Vaping Use: Never used  Substance and Sexual Activity   Alcohol use: No   Drug use: No   Sexual activity: Not on file  Other Topics Concern   Not on file  Social History Narrative   Physicist, medical, Lawyer.   Widowed, lives with son.   Social Determinants of Health   Financial Resource Strain: Not on file  Food Insecurity: Not on file  Transportation Needs: Not on file  Physical Activity: Not on file  Stress: Not on file  Social Connections: Not on file  Intimate Partner Violence: Not on file      Review of systems: All other review of systems negative except as mentioned in the HPI.   Physical Exam: Vitals:   07/31/22 1530  BP: 98/68  Pulse: (Abnormal) 56  SpO2: 95%   Body mass index is 40.3 kg/m. Gen:      No acute distress HEENT:  sclera anicteric Abd:      soft, non-tender; no palpable masses, no distension Ext:    No edema Neuro: alert and oriented x 3 Psych: normal mood and affect  Data Reviewed:  Reviewed labs, radiology imaging, old records and pertinent past GI work up   Assessment and Plan/Recommendations:  62 year old very pleasant female with morbid obesity, hypertension, hyperlipidemia, COPD, OSA, diastolic dysfunction,  here for follow-up for chronic GERD with recent exacerbation of symptoms She is experiencing intermittent epigastric and left upper quadrant abdominal pain   Loss of clinical response to PPI, she is having recurrent GERD symptoms despite high-dose PPI and H2 blocker Switch to oral Voquenza 20 mg daily and Pepcid 20 mg at  bedtime Continue lifestyle modifications and antireflux measures If continues to have persistent symptoms, will plan for repeat EGD for evaluation  Will refer to [facility] to evaluate for hiatal hernia repair and possible bariatric surgery    Return in 2 months or sooner if needed  This visit required 40 minutes of patient care (this includes precharting, chart review, review of results, face-to-face time used for counseling as well as treatment plan and follow-up. The patient was provided an opportunity to ask questions and all were answered. The patient agreed with the plan and demonstrated an understanding of the instructions.  Iona Beard , MD    CC: Verlon Au, MD

## 2022-07-31 NOTE — Patient Instructions (Addendum)
Start Voquenza samples taking 1 tablet 20 mg daily. We have sent a prescription to [facility] to mail you the prescription.  We have sent the following medications to your local pharmacy for you to pick up at your convenience: pepcid 20 mg daily at bedtime.   We have referred you [facility] surgery. They will contact you with an appt date and time.   _______________________________________________________  If your blood pressure at your visit was 140/90 or greater, please contact your primary care physician to follow up on this.  _______________________________________________________  If you are age 64 or older, your body mass index should be between 23-30. Your Body mass index is 40.3 kg/m. If this is out of the aforementioned range listed, please consider follow up with your Primary Care Provider.  If you are age 61 or younger, your body mass index should be between 19-25. Your Body mass index is 40.3 kg/m. If this is out of the aformentioned range listed, please consider follow up with your Primary Care Provider.   ________________________________________________________  The [place] [facility] providers would like to encourage you to use [facility] to communicate with providers for non-urgent requests or questions.  Due to long hold times on the telephone, sending your provider a message by [facility] may be a faster and more efficient way to get a response.  Please allow 48 business hours for a response.  Please remember that this is for non-urgent requests.  _______________________________________________________

## 2022-08-01 ENCOUNTER — Encounter: Payer: Self-pay | Admitting: Gastroenterology

## 2022-08-01 ENCOUNTER — Telehealth: Payer: Self-pay | Admitting: Gastroenterology

## 2022-08-01 NOTE — Telephone Encounter (Signed)
Inbound call from patient in regards to a referral for her to see a Careers adviser. Please advise.

## 2022-08-02 NOTE — Telephone Encounter (Signed)
, °

## 2022-08-02 NOTE — Telephone Encounter (Signed)
Called patient and informed her the referral had been faxed to [facility] and they will contact her with an appt but it could take 1-2 weeks. Patient verbalized understanding.

## 2022-08-02 NOTE — Telephone Encounter (Signed)
Marchelle Folks did the referral to [facility]

## 2022-08-09 ENCOUNTER — Encounter (HOSPITAL_BASED_OUTPATIENT_CLINIC_OR_DEPARTMENT_OTHER): Payer: Self-pay | Admitting: Pulmonary Disease

## 2022-08-09 ENCOUNTER — Ambulatory Visit (INDEPENDENT_AMBULATORY_CARE_PROVIDER_SITE_OTHER): Payer: 59 | Admitting: Pulmonary Disease

## 2022-08-09 VITALS — BP 118/64 | HR 51 | Temp 98.0°F | Ht 63.0 in | Wt 226.4 lb

## 2022-08-09 DIAGNOSIS — R634 Abnormal weight loss: Secondary | ICD-10-CM

## 2022-08-09 DIAGNOSIS — G4733 Obstructive sleep apnea (adult) (pediatric): Secondary | ICD-10-CM

## 2022-08-09 NOTE — Patient Instructions (Signed)
Will arrange for a home sleep study.  Don't wear your Bipap machine when you do the home sleep study.  Will call to arrange for follow up after sleep study reviewed.

## 2022-08-09 NOTE — Progress Notes (Signed)
[place] [facility], [facility], [facility] Sleep Medicine  Chief Complaint  Patient presents with   Follow-up    Lost 80 lbs.  May need new CPAP mask.  Discuss pressure settings    Constitutional:  BP 118/64 (BP Location: Left Arm, Patient Position: Sitting, Cuff Size: Large)   Pulse (!) 51   Temp 98 F (36.7 C) (Oral)   Ht 5\' 3"  (1.6 m)   Wt 226 lb 6.4 oz (102.7 kg)   SpO2 98%   BMI 40.10 kg/m   Past Medical History:  Diastolic CHF, Allergies, Anemia, Anxiety, CAD, Carpal tunnel syndrome, Migraine headaches, COPD, Diverticulitis, GERD, Fatty liver, Hiatal hernia, HLD, HTN, IBS, COVID 19 in December 2021  Past Surgical History:  She  has a past surgical history that includes Tubal ligation; Tonsillectomy; Uvulopalatopharyngoplasty; Cesarean section; Nasal sinus surgery; Partial hysterectomy; Abdominal hysterectomy; Colonoscopy; Colonoscopy with propofol (N/A, 05/05/2017); Esophagogastroduodenoscopy (egd) with propofol (N/A, 06/22/2018); biopsy (06/22/2018); Esophagogastroduodenoscopy (egd) with propofol (N/A, 07/28/2018); biopsy (07/28/2018); Turbinate reduction (Bilateral, 05/28/2019); Sinus endo with fusion (Left, 05/28/2019); Colonoscopy with propofol (N/A, 01/07/2022); Esophagogastroduodenoscopy (egd) with propofol (N/A, 01/07/2022); and polypectomy (01/07/2022).  Brief Summary:  [name] is a 62 y.o. female with dyspnea, chronic bronchitis, and obstructive sleep apnea.      Subjective:   I last saw her in November 2022.  Her weight at the time was 306 lbs.  She changed her diet and is exercising more.  She has been able to lose 80 lbs.  She feels much better, her breathing is better, and her knees don't hurt anymore.  She tries to use Bipap, but mask no longer fits well and pressure feels too high.  Her Bipap is more than 64 yrs old.  Physical Exam:   Appearance - well kempt   ENMT - no sinus tenderness, no oral exudate, no LAN, Mallampati 3 airway, no stridor  Respiratory -  equal breath sounds bilaterally, no wheezing or rales  CV - s1s2 regular rate and rhythm, no murmurs  Ext - no clubbing, no edema  Skin - no rashes  Psych - normal mood and affect    Sleep Tests:  ONO with Bipap 11/30/20 >> test time 7 hrs 12 min. Basal SpO2 94.8%, low SpO2 84%. Spent 2 min with SpO2 < 88%.  Bipap 05/10/22 to 08/07/22 >> used on 74 of 90 nights with average 4 hours.  Average AHI 14.9 with Bipap 13/9 cm H2O  Cardiac Tests:  Echo 03/23/19 >> EF greater than 75%, mod LVH  Social History:  She  reports that she has never smoked. She has never used smokeless tobacco. She reports that she does not drink alcohol and does not use drugs.  Family History:  Her family history includes Asthma in her sister; Colon polyps in her sister; Diabetes in her brother, brother, brother, brother, half-sister, sister, and sister; Heart attack (age of onset: 87) in her mother; Heart disease in her mother; Hypertension in her mother; Lung cancer in her brother.     Assessment/Plan:   Obstructive sleep apnea. - she has lost a significant amount of weight and might no longer need PAP therapy - will arrange for a home sleep study to assess current status of sleep apnea - if she still needs PAP therapy, then will need a new device from [employer] but might be able to switch to a CPAP device rather than a Bipap device - she has been compliant and reports benefit from Bipap otherwise  Obesity. - congratulated  her on her progress with weight loss and encouraged her to keep up these efforts  COPD, history of COVID 19 infection. - she will be followed by Dr. Sandrea Hughs in [place] - she will be followed by Dr. Avon Gully with [facility], [address], [place], [place] [address] (phone [phone], faxe [phone])  Time Spent Involved in Patient Care on Day of Examination:  28 minutes  Follow up:   Patient Instructions  Will arrange for a home sleep study.  Don't  wear your Bipap machine when you do the home sleep study.  Will call to arrange for follow up after sleep study reviewed.   Medication List:   Allergies as of 08/09/2022       Reactions   Sulfonamide Derivatives Anaphylaxis, Swelling, Rash, Other (See Comments)   Swelling tongue and ear    Doxycycline Nausea And Vomiting   Latex Hives   Ciprofloxacin Rash   Fish Allergy Rash, Other (See Comments)   Only reaction to Mackerel.  No other issues with any type of fish or shellfish currently    Hydrocortisone Rash   Neomycin Rash        Medication List        Accurate as of August 09, 2022 11:34 AM. If you have any questions, ask your nurse or doctor.          STOP taking these medications    benzonatate 200 MG capsule Commonly known as: TESSALON Stopped by: Nautika Cressey   Mucinex DM Maximum Strength 60-1200 MG Tb12 Stopped by: Mehlani Blankenburg       TAKE these medications    acetaminophen 650 MG CR tablet Commonly known as: TYLENOL Take 650 mg by mouth every 8 (eight) hours as needed for pain (headache).   albuterol (2.5 MG/3ML) 0.083% nebulizer solution Commonly known as: PROVENTIL Take 3 mLs (2.5 mg total) by nebulization in the morning, at noon, in the evening, and at bedtime.   budesonide 0.25 MG/2ML nebulizer solution Commonly known as: Pulmicort Take 2 mLs (0.25 mg total) by nebulization in the morning, at noon, in the evening, and at bedtime.   carvedilol 12.5 MG tablet Commonly known as: COREG Take 0.5 tablets (6.25 mg total) by mouth daily.   dicyclomine 10 MG capsule Commonly known as: BENTYL Take 1 capsule (10 mg total) by mouth every 8 (eight) hours as needed for spasms. What changed:  when to take this additional instructions   EPINEPHrine 0.3 mg/0.3 mL Soaj injection Commonly known as: EPI-PEN Inject 0.3 mg into the muscle as needed for anaphylaxis.   ergocalciferol 1.25 MG (50000 UT) capsule Commonly known as: VITAMIN D2 Take 50,000 Units  by mouth once a week. Fridays   famotidine 20 MG tablet Commonly known as: Pepcid One after supper   Flutter Devi 1 application by Does not apply route as directed.   Klor-Con M20 20 MEQ tablet Generic drug: potassium chloride SA TAKE 1 TABLET BY MOUTH AS DIRECTED. 1 TABLET DAILY. TAKE EXTRA TAB WITH TORSEMIDE EXTRA DOSE   Linzess 290 MCG Caps capsule Generic drug: linaclotide TAKE 1 CAPSULE BY MOUTH EVERY DAY BEFORE BREAKFAST   montelukast 10 MG tablet Commonly known as: SINGULAIR TAKE 1 TABLET(10 MG) BY MOUTH AT BEDTIME   rosuvastatin 10 MG tablet Commonly known as: CRESTOR Take 10 mg by mouth daily.   spironolactone 25 MG tablet Commonly known as: ALDACTONE TAKE 1 TABLET BY MOUTH EVERY DAY IN THE MORNING   topiramate 25 MG tablet  Commonly known as: TOPAMAX Take 25 mg by mouth 2 (two) times daily.   Voquezna 20 MG Tabs Generic drug: Vonoprazan Fumarate Take 1 tablet by mouth daily.        Signature:  Coralyn Helling, MD [facility] Pulmonary/Critical Care Pager - [phone] 08/09/2022, 11:34 AM

## 2022-08-14 NOTE — Telephone Encounter (Signed)
Called [facility] for status of referral. Patient has an appt with Dr. Sheliah Hatch on 08/15/22 and patient has been notified.

## 2022-08-15 NOTE — Telephone Encounter (Signed)
Informed patient I have sent the referral to [facility] bariatric and general surgery and will wait to hear back from them for an appt. Patient verbalized understanding.

## 2022-08-15 NOTE — Telephone Encounter (Addendum)
Patient called stated she was referred to [facility] and she would like to be sent else where was not pleased with them at all.

## 2022-08-15 NOTE — Telephone Encounter (Signed)
Ok, please send referral to [facility] Bariatric surgery. Thanks

## 2022-08-16 ENCOUNTER — Telehealth: Payer: Self-pay | Admitting: Gastroenterology

## 2022-08-16 NOTE — Telephone Encounter (Signed)
Pt said she needs her medication for Voquenza sent to pharmacy.  She also wants to know if we have any samples because she only has enough for one more day.

## 2022-08-16 NOTE — Telephone Encounter (Signed)
I returned patients call and she will come and pick up samples on Monday if she can't make it here today

## 2022-08-21 NOTE — Telephone Encounter (Signed)
Called [facility] bariatric surgery and general surgery center to check on status of referral. Appointment scheduler states her records are still in review and they will reach out to the patient once they have been scheduled. Notified patient of status. Patient verbalized understanding.

## 2022-08-28 MED ORDER — VOQUEZNA 20 MG PO TABS: 1.0000 | ORAL_TABLET | Freq: Every day | ORAL | 3 refills | Status: DC

## 2022-08-28 NOTE — Telephone Encounter (Signed)
Called [facility] to check status of referral. Patient has been scheduled for 09/04/22 at 11:45 am with Charlotte Easterly, NP at [address] Simmie [name], [place]. Informed patient of appt date and time. Patient states that there is a scheduling conflict and wants to call them or reschedule. Provided patient with phone number to office.

## 2022-08-28 NOTE — Telephone Encounter (Signed)
I tried to return patients call, Not sure what orders she is talking about I left her samples on 08/16/2022

## 2022-08-28 NOTE — Telephone Encounter (Signed)
I spoke to patient, she never picked up her samples and she is in [place].  She wants me to [facility] [address] in [place]   I sent rx electronically but explained to her she may not get the rx because we sent rx to [facility] for them to try to get approved in July. She wanted [facility].  She wanted to know why her referral said bariatric surgery she wants to be sure they know it is for Hiatal Hernia Repair . Marchelle Folks done the referral for hernia repair she is going to contact [facility] to make sure they know this.

## 2022-08-28 NOTE — Addendum Note (Signed)
Addended by: Marlowe Kays on: 08/28/2022 04:41 PM   Modules accepted: Orders

## 2022-08-28 NOTE — Telephone Encounter (Signed)
Inbound call from patient states her orders are incorrect. Please advise.

## 2022-08-29 NOTE — Telephone Encounter (Signed)
Spoke with Shanda Bumps at [facility] general surgery and explained that the patient was under the impression she was only scheduled for bariatric surgery and not hiatal hernia consultation. Shanda Bumps states the the appt was only scheduled for bariatric surgery consultation even though the referral is for both. Jessica scheduled patient with Dr. Cloretta Ned on 09/10/22 at 9:00am for consultation for hernia repair.  Patient state she cannot come that appt since she will still be in [place]. I called [facility] back and rescheduled her appt for 10/01/22 at 10:30 am. Patient is aware and verbalized understanding.

## 2022-09-02 ENCOUNTER — Telehealth: Payer: Self-pay

## 2022-09-02 NOTE — Telephone Encounter (Signed)
*  Gastro  Pharmacy Patient Advocate Encounter   Received notification from CoverMyMeds that prior authorization for Voquezna 20MG  tablets  is required/requested.   Insurance verification completed.   The patient is insured through [facility] .   Per test claim: PA required; PA submitted to [facility] via CoverMyMeds Key/confirmation #/EOC [id number] Status is pending

## 2022-09-09 NOTE — Telephone Encounter (Signed)
Pharmacy Patient Advocate Encounter  Received notification from [facility] that Prior Authorization for Voquenza has been APPROVED from 09/02/2022 to 09/01/2023

## 2022-10-10 ENCOUNTER — Ambulatory Visit: Payer: 59 | Admitting: Gastroenterology

## 2022-10-11 ENCOUNTER — Emergency Department (HOSPITAL_COMMUNITY): Payer: 59

## 2022-10-11 ENCOUNTER — Other Ambulatory Visit: Payer: Self-pay

## 2022-10-11 ENCOUNTER — Emergency Department (HOSPITAL_COMMUNITY)
Admission: EM | Admit: 2022-10-11 | Discharge: 2022-10-11 | Disposition: A | Discharge status: Home / Self Care | Payer: 59 | Attending: Emergency Medicine | Admitting: Emergency Medicine

## 2022-10-11 DIAGNOSIS — R519 Headache, unspecified: Secondary | ICD-10-CM | POA: Diagnosis present

## 2022-10-11 DIAGNOSIS — I129 Hypertensive chronic kidney disease with stage 1 through stage 4 chronic kidney disease, or unspecified chronic kidney disease: Secondary | ICD-10-CM | POA: Insufficient documentation

## 2022-10-11 DIAGNOSIS — R001 Bradycardia, unspecified: Secondary | ICD-10-CM | POA: Diagnosis not present

## 2022-10-11 DIAGNOSIS — R55 Syncope and collapse: Secondary | ICD-10-CM | POA: Diagnosis not present

## 2022-10-11 DIAGNOSIS — S0003XA Contusion of scalp, initial encounter: Secondary | ICD-10-CM | POA: Insufficient documentation

## 2022-10-11 DIAGNOSIS — S0990XA Unspecified injury of head, initial encounter: Secondary | ICD-10-CM

## 2022-10-11 DIAGNOSIS — Z91040 Latex allergy status: Secondary | ICD-10-CM | POA: Diagnosis not present

## 2022-10-11 DIAGNOSIS — Z79899 Other long term (current) drug therapy: Secondary | ICD-10-CM | POA: Insufficient documentation

## 2022-10-11 DIAGNOSIS — W0110XA Fall on same level from slipping, tripping and stumbling with subsequent striking against unspecified object, initial encounter: Secondary | ICD-10-CM | POA: Diagnosis not present

## 2022-10-11 DIAGNOSIS — E876 Hypokalemia: Secondary | ICD-10-CM | POA: Diagnosis not present

## 2022-10-11 DIAGNOSIS — N189 Chronic kidney disease, unspecified: Secondary | ICD-10-CM | POA: Insufficient documentation

## 2022-10-11 LAB — BASIC METABOLIC PANEL
Anion gap: 11 (ref 5–15)
BUN: 20 mg/dL (ref 8–23)
CO2: 27 mmol/L (ref 22–32)
Calcium: 8.8 mg/dL — ABNORMAL LOW (ref 8.9–10.3)
Chloride: 101 mmol/L (ref 98–111)
Creatinine, Ser: 0.7 mg/dL (ref 0.44–1.00)
GFR, Estimated: >60 mL/min (ref >60)
Glucose, Bld: 110 mg/dL — ABNORMAL HIGH (ref 70–99)
Potassium: 2.6 mmol/L — CL (ref 3.5–5.1)
Sodium: 139 mmol/L (ref 135–145)

## 2022-10-11 LAB — CBG MONITORING, ED: Glucose-Capillary: 122 mg/dL — ABNORMAL HIGH (ref 70–99)

## 2022-10-11 LAB — CBC
HCT: 36.7 % (ref 36.0–46.0)
Hemoglobin: 11.8 g/dL — ABNORMAL LOW (ref 12.0–15.0)
MCH: 30.7 pg (ref 26.0–34.0)
MCHC: 32.2 g/dL (ref 30.0–36.0)
MCV: 95.6 fL (ref 80.0–100.0)
Platelets: 221 10*3/uL (ref 150–400)
RBC: 3.84 MIL/uL — ABNORMAL LOW (ref 3.87–5.11)
RDW: 13.2 % (ref 11.5–15.5)
WBC: 8.4 10*3/uL (ref 4.0–10.5)
nRBC: 0 % (ref 0.0–0.2)

## 2022-10-11 LAB — MAGNESIUM: Magnesium: 2.1 mg/dL (ref 1.7–2.4)

## 2022-10-11 MED ORDER — POTASSIUM CHLORIDE CRYS ER 20 MEQ PO TBCR: 20.0000 meq | EXTENDED_RELEASE_TABLET | Freq: Two times a day (BID) | ORAL | 0 refills | Status: DC

## 2022-10-11 MED ORDER — POTASSIUM CHLORIDE 10 MEQ/100ML IV SOLN
10.0000 meq | INTRAVENOUS | Status: AC
  Administered 2022-10-11 (×2): 10 meq via INTRAVENOUS
  Filled 2022-10-11 (×2): qty 100

## 2022-10-11 MED ORDER — POTASSIUM CHLORIDE CRYS ER 20 MEQ PO TBCR
40.0000 meq | EXTENDED_RELEASE_TABLET | Freq: Once | ORAL | Status: AC
  Administered 2022-10-11: 40 meq via ORAL
  Filled 2022-10-11: qty 2

## 2022-10-11 MED ORDER — HYDROCODONE-ACETAMINOPHEN 5-325 MG PO TABS
1.0000 | ORAL_TABLET | Freq: Once | ORAL | Status: AC
  Administered 2022-10-11: 1 via ORAL
  Filled 2022-10-11: qty 1

## 2022-10-11 MED ORDER — TRAMADOL HCL 50 MG PO TABS
50.0000 mg | ORAL_TABLET | Freq: Four times a day (QID) | ORAL | 0 refills | Status: AC | PRN
Start: 2022-10-11 — End: ?

## 2022-10-11 NOTE — ED Triage Notes (Signed)
Pt arrived via POV. C/o occipital head pain following LOC yesterday at 1300. Pt was at [employer] funeral and lost consciousness after viewing the body, fell back and hit head.  AOx4

## 2022-10-11 NOTE — ED Provider Notes (Signed)
[place] EMERGENCY DEPARTMENT AT [facility] Provider Note   CSN: [id number] Arrival date & time: 10/11/22  0845     History  Chief Complaint  Patient presents with   Loss of Consciousness   Head Injury    [name] is a 62 y.o. female.   Loss of Consciousness Head Injury    Patient has a history of hyperlipidemia hypertension and sinusitis chronic kidney disease glaucoma, migraines.  Patient presents to the ED for evaluation of headache after a syncopal episode.  Patient was at her mother's funeral yesterday.  During the viewing of the body the patient became lightheaded and had a syncopal episode.  She fell back and struck the back of her head.  Patient has had trouble with a headache since that time.  She tried taking her medications.  When she woke up this morning continued to have pain in the back of her head as well as a pressure in her eyes.  She is not have any vomiting.  No numbness or weakness.  She is not in any neck pain or back pain.  No other injuries.  Patient is able to walk without difficulty  Home Medications Prior to Admission medications   Medication Sig Start Date End Date Taking? Authorizing Provider  potassium chloride SA (KLOR-CON M) 20 MEQ tablet Take 1 tablet (20 mEq total) by mouth 2 (two) times daily. 10/11/22  Yes Linwood Dibbles, MD  traMADol (ULTRAM) 50 MG tablet Take 1 tablet (50 mg total) by mouth every 6 (six) hours as needed. 10/11/22  Yes Linwood Dibbles, MD  acetaminophen (TYLENOL) 650 MG CR tablet Take 650 mg by mouth every 8 (eight) hours as needed for pain (headache).    [provider]  albuterol (PROVENTIL) (2.5 MG/3ML) 0.083% nebulizer solution Take 3 mLs (2.5 mg total) by nebulization in the morning, at noon, in the evening, and at bedtime. 06/25/22   Nyoka Cowden, MD  budesonide (PULMICORT) 0.25 MG/2ML nebulizer solution Take 2 mLs (0.25 mg total) by nebulization in the morning, at noon, in the evening, and at bedtime.  06/25/22   Nyoka Cowden, MD  carvedilol (COREG) 12.5 MG tablet Take 0.5 tablets (6.25 mg total) by mouth daily. 04/13/21   Alwyn Ren, MD  dicyclomine (BENTYL) 10 MG capsule Take 1 capsule (10 mg total) by mouth every 8 (eight) hours as needed for spasms. Patient taking differently: Take 10 mg by mouth 4 (four) times daily as needed for spasms. And at bedtime as needed 04/24/20   Napoleon Form, MD  EPINEPHrine 0.3 mg/0.3 mL IJ SOAJ injection Inject 0.3 mg into the muscle as needed for anaphylaxis. 12/19/19   [provider]  ergocalciferol (VITAMIN D2) 1.25 MG (50000 UT) capsule Take 50,000 Units by mouth once a week. Fridays 05/18/20   [provider]  famotidine (PEPCID) 20 MG tablet One after supper 07/31/22   Napoleon Form, MD  LINZESS 290 MCG CAPS capsule TAKE 1 CAPSULE BY MOUTH EVERY DAY BEFORE BREAKFAST 06/11/22   Napoleon Form, MD  montelukast (SINGULAIR) 10 MG tablet TAKE 1 TABLET(10 MG) BY MOUTH AT BEDTIME 07/25/17   Nyoka Cowden, MD  Respiratory Therapy Supplies (FLUTTER) DEVI 1 application by Does not apply route as directed. 12/27/16   Nyoka Cowden, MD  rosuvastatin (CRESTOR) 10 MG tablet Take 10 mg by mouth daily.    [provider]  spironolactone (ALDACTONE) 25 MG tablet TAKE 1 TABLET BY MOUTH EVERY  DAY IN THE MORNING 06/18/22   Yates Decamp, MD  topiramate (TOPAMAX) 25 MG tablet Take 25 mg by mouth 2 (two) times daily.    [provider]  Vonoprazan Fumarate (VOQUEZNA) 20 MG TABS Take 1 tablet by mouth daily. 08/28/22   Napoleon Form, MD      Allergies    Sulfonamide derivatives, Doxycycline, Latex, Ciprofloxacin, Fish allergy, Hydrocortisone, and Neomycin    Review of Systems   Review of Systems  Cardiovascular:  Positive for syncope.    Physical Exam Updated Vital Signs BP (!) 92/56 Comment: nurse notified  Pulse (!) 47   Temp 97.9 F (36.6 C) (Oral)   Resp 13   Ht 1.6 m (5\' 3" )   Wt 99.8 kg   SpO2 100%    BMI 38.97 kg/m  Physical Exam Vitals and nursing note reviewed.  Constitutional:      General: She is not in acute distress.    Appearance: She is well-developed.  HENT:     Head: Normocephalic.     Comments: Hematoma posterior occiput, tenderness to palpation    Right Ear: External ear normal.     Left Ear: External ear normal.  Eyes:     General: No scleral icterus.       Right eye: No discharge.        Left eye: No discharge.     Conjunctiva/sclera: Conjunctivae normal.  Neck:     Trachea: No tracheal deviation.  Cardiovascular:     Rate and Rhythm: Normal rate and regular rhythm.  Pulmonary:     Effort: Pulmonary effort is normal. No respiratory distress.     Breath sounds: Normal breath sounds. No stridor. No wheezing or rales.  Abdominal:     General: Bowel sounds are normal. There is no distension.     Palpations: Abdomen is soft.     Tenderness: There is no abdominal tenderness. There is no guarding or rebound.  Musculoskeletal:        General: No tenderness or deformity.     Cervical back: Normal and neck supple.     Thoracic back: Normal.     Lumbar back: Normal.     Comments: No tenderness palpation bilateral upper extremities and lower extremities, full range of motion  Skin:    General: Skin is warm and dry.     Findings: No rash.  Neurological:     General: No focal deficit present.     Mental Status: She is alert.     Cranial Nerves: No cranial nerve deficit, dysarthria or facial asymmetry.     Sensory: No sensory deficit.     Motor: No abnormal muscle tone or seizure activity.     Coordination: Coordination normal.  Psychiatric:        Mood and Affect: Mood normal.     ED Results / Procedures / Treatments   Labs (all labs ordered are listed, but only abnormal results are displayed) Labs Reviewed  BASIC METABOLIC PANEL - Abnormal; Notable for the following components:      Result Value   Potassium 2.6 (*)    Glucose, Bld 110 (*)    Calcium  8.8 (*)    All other components within normal limits  CBC - Abnormal; Notable for the following components:   RBC 3.84 (*)    Hemoglobin 11.8 (*)    All other components within normal limits  CBG MONITORING, ED - Abnormal; Notable for the following components:   Glucose-Capillary 122 (*)  All other components within normal limits  MAGNESIUM    EKG EKG Interpretation Date/Time:  Friday October 11 2022 09:09:48 EDT Ventricular Rate:  59 PR Interval:  199 QRS Duration:  96 QT Interval:  436 QTC Calculation: 432 R Axis:   57  Text Interpretation: Sinus rhythm Low voltage, precordial leads No significant change since last tracing Confirmed by Linwood Dibbles [phone]) on 10/11/2022 9:33:22 AM  Radiology CT Head Wo Contrast  Result Date: 10/11/2022 CLINICAL DATA:  61 year old female with pain after syncope and fall backwards yesterday with loss of consciousness. EXAM: CT HEAD WITHOUT CONTRAST TECHNIQUE: Contiguous axial images were obtained from the base of the skull through the vertex without intravenous contrast. RADIATION DOSE REDUCTION: This exam was performed according to the departmental dose-optimization program which includes automated exposure control, adjustment of the mA and/or kV according to patient size and/or use of iterative reconstruction technique. COMPARISON:  Head CT 04/13/2021. FINDINGS: Brain: Normal cerebral volume. No midline shift, ventriculomegaly, mass effect, evidence of mass lesion, intracranial hemorrhage or evidence of cortically based acute infarction. Gray-white matter differentiation is within normal limits throughout the brain. Vascular: No suspicious intracranial vascular hyperdensity. Dominant distal left vertebral artery, normal variant. Calcified atherosclerosis at the skull base. Skull: Hyperostosis, normal variant.  No skull fracture identified. Sinuses/Orbits: Visualized paranasal sinuses and mastoids are stable and well aerated. Other: No orbit or scalp  soft tissue injury identified. IMPRESSION: Normal for age noncontrast Head CT. No acute traumatic injury identified. Electronically Signed   By: Odessa Fleming M.D.   On: 10/11/2022 10:23    Procedures Procedures    Medications Ordered in ED Medications  potassium chloride 10 mEq in 100 mL IVPB (10 mEq Intravenous New Bag/Given 10/11/22 1332)  HYDROcodone-acetaminophen (NORCO/VICODIN) 5-325 MG per tablet 1 tablet (1 tablet Oral Given 10/11/22 1022)  potassium chloride SA (KLOR-CON M) CR tablet 40 mEq (40 mEq Oral Given 10/11/22 1223)    ED Course/ Medical Decision Making/ A&P Clinical Course as of 10/11/22 1421  Fri Oct 11, 2022  1047 Head CT without acute findings there [JK]  1202 Metabolic panel shows hypokalemia.  Will proceed with potassium replacement.  CBC normal [JK]    Clinical Course User Index [JK] Linwood Dibbles, MD                                 Medical Decision Making Problems Addressed: Hypokalemia: acute illness or injury that poses a threat to life or bodily functions Injury of head, initial encounter: acute illness or injury that poses a threat to life or bodily functions  Amount and/or Complexity of Data Reviewed Labs: ordered. Decision-making details documented in ED Course. Radiology: ordered and independent interpretation performed.  Risk Prescription drug management. Drug therapy requiring intensive monitoring for toxicity.   Patient presented to the ED for evaluation of a syncopal episode associated with emotional distress during a funeral.  Patient then hit the back of her head and presented to the ED with complaints of persistent headache.  ED workup overall reassuring.  No signs of cardiac dysrhythmia.  No anemia.  Patient's labs however did show evidence of hypokalemia.  She is not having any vomiting or diarrhea.  Patient was treated with IV and oral potassium.  Patient noted to have some bradycardia while in the ED.  This was at rest.  Asymptomatic during this  time but I do think it be reasonable to have her follow-up with  cardiology for outpatient evaluation for syncope.  No signs of any serious injury with her fall.  She did have a occipital hematoma but head CT does not show any acute abnormality.  Will discharge home with medications for potassium outpatient cardiology referral.        Final Clinical Impression(s) / ED Diagnoses Final diagnoses:  Injury of head, initial encounter  Hypokalemia  Syncope, unspecified syncope type  Bradycardia    Rx / DC Orders ED Discharge Orders          Ordered    traMADol (ULTRAM) 50 MG tablet  Every 6 hours PRN        10/11/22 1332    potassium chloride SA (KLOR-CON M) 20 MEQ tablet  2 times daily        10/11/22 1332    Ambulatory referral to Cardiology       Comments: If you have not heard from the Cardiology office within the next 72 hours please call [phone].   10/11/22 1337              Linwood Dibbles, MD 10/11/22 1421

## 2022-10-11 NOTE — Discharge Instructions (Addendum)
Take the medications as prescribed to help with your headache as well as your low potassium level.  Follow-up with your primary care doctor next week to have your potassium level rechecked.

## 2022-10-13 ENCOUNTER — Other Ambulatory Visit: Payer: Self-pay | Admitting: Cardiology

## 2022-10-13 DIAGNOSIS — I5032 Chronic diastolic (congestive) heart failure: Secondary | ICD-10-CM

## 2022-10-18 ENCOUNTER — Other Ambulatory Visit: Payer: Self-pay | Admitting: Family Medicine

## 2022-10-18 DIAGNOSIS — Z1231 Encounter for screening mammogram for malignant neoplasm of breast: Secondary | ICD-10-CM

## 2022-10-21 ENCOUNTER — Other Ambulatory Visit: Payer: Self-pay

## 2022-10-21 MED ORDER — POTASSIUM CHLORIDE CRYS ER 20 MEQ PO TBCR: 20.0000 meq | EXTENDED_RELEASE_TABLET | Freq: Two times a day (BID) | ORAL | 1 refills | Status: DC

## 2022-10-24 ENCOUNTER — Telehealth (HOSPITAL_BASED_OUTPATIENT_CLINIC_OR_DEPARTMENT_OTHER): Payer: Self-pay | Admitting: Pulmonary Disease

## 2022-10-24 NOTE — Telephone Encounter (Signed)
Order was placed with [facility] on 08/12/22 per their website patient delayed testing

## 2022-10-25 NOTE — Telephone Encounter (Signed)
Is the patient able to call [facility] and schedule with them or would she need a new order?

## 2022-10-25 NOTE — Telephone Encounter (Signed)
I spoke with Victorino Dike with [facility] and she is going to call the patient right now to see if they can get her scheduled. If the patient answers the phone they can have the machine mailed out today

## 2022-10-25 NOTE — Telephone Encounter (Signed)
This will need an auth to be done in the office

## 2022-11-04 ENCOUNTER — Ambulatory Visit: Payer: 59 | Admitting: Adult Health

## 2022-11-04 DIAGNOSIS — G4733 Obstructive sleep apnea (adult) (pediatric): Secondary | ICD-10-CM

## 2022-11-04 DIAGNOSIS — R634 Abnormal weight loss: Secondary | ICD-10-CM

## 2022-11-06 ENCOUNTER — Ambulatory Visit: Payer: 59 | Admitting: Physician Assistant

## 2022-11-12 ENCOUNTER — Encounter: Payer: Self-pay | Admitting: Gastroenterology

## 2022-11-12 ENCOUNTER — Ambulatory Visit: Payer: 59 | Admitting: Gastroenterology

## 2022-11-12 VITALS — BP 120/80 | HR 49 | Ht 63.0 in | Wt 228.0 lb

## 2022-11-12 DIAGNOSIS — K219 Gastro-esophageal reflux disease without esophagitis: Secondary | ICD-10-CM

## 2022-11-12 DIAGNOSIS — K449 Diaphragmatic hernia without obstruction or gangrene: Secondary | ICD-10-CM

## 2022-11-12 SURGERY — PH IMPEDANCE STUDY

## 2022-11-12 MED ORDER — DEXLANSOPRAZOLE 60 MG PO CPDR: 60.0000 mg | DELAYED_RELEASE_CAPSULE | Freq: Every day | ORAL | 3 refills | Status: DC

## 2022-11-12 NOTE — Progress Notes (Signed)
[name]    [id number]    1960-11-13  Primary Care Physician:Boyd, Leanora Cover, MD  Referring Physician: Verlon Au, MD [address] [place],  [place] [address]   Chief complaint:  Chief Complaint  Patient presents with   Gastroesophageal Reflux    Pt state she has no change in her reflux.    HPI: 62 year old very pleasant female with history of obesity, obstructive sleep apnea, COPD here for follow-up visit for worsening GERD symptoms.  Last seen on 07-31-22.  Today, she complains of reflux symptoms. She states that she prefers to take Dexilant. She states that she was seen by an otolaryngologist  yesterday and had undergone a video stroboscopy. They believed the chronic cough was most likely due to her previous Covid infection and reflux. We also discussed alternate options including Nissen fundoplication to help manage her reflux symptoms.   She has also been dealing with stress due to recently losing her mother.   Patient denies any diarrhea, constipation, nausea, blood in stool, black stool, vomiting, abdominal pain, bloating, unintentional weight loss.   GI history: EGD January 07, 2022 - Z- line regular, 38 cm from the incisors. - LA Grade B reflux esophagitis with no bleeding. - 4 cm hiatal hernia. - Erosive gastropathy  Colonoscopy January 07, 2022 - One 5 mm polyp in the sigmoid colon [hyperplastic], removed with a cold snare. Resected and retrieved. - Non- bleeding A. COLON, SIGMOID, POLYPECTOMY:  Colonic mucosa with benign lymphoid aggregate.  Multiple additional levels examined.   CT Abdomen Pelvis w contrast 06-08-21 1.  No CT evidence of acute abdominal/pelvic process. 2.  Fat containing large umbilical hernia, unchanged. 3.  Fatty infiltration of the head of the pancreas. 4. Degenerate disc disease of the lumbar spine. No acute osseous abnormality.  EGD 12/09/18 - The esophagus was normal. - A large  amount of a phytobezoar was found in the gastric body with no evidence of gastric outlet obstruction. Limited visualization of the gastric mucosa - The examined duodenum was normal.   EGD July 28, 2018: Few superficial linear nonbleeding gastric ulcers in the antrum and gastritis.  Benign fundic gland polyps.  Esophagus and duodenum was normal.   EGD June 22, 2018 with moderate amount of residue food in the body and fundus of stomach with limited visualization of the lining, gastritis, biopsies negative for H. pylori   Gastric emptying scan July 08, 2018: Normal   CT abdomen pelvis March 30, 2018: No acute intra-abdominal or pelvic abnormality, moderate fat-containing umbilical hernia   Colonoscopy April 2019 with removal of 11 mm hamartomatous polyp in sigmoid colon   Colonoscopy November 04, 2003 normal with no polyps     Current Outpatient Medications:    acetaminophen (TYLENOL) 650 MG CR tablet, Take 650 mg by mouth every 8 (eight) hours as needed for pain (headache)., Disp: , Rfl:    albuterol (PROVENTIL) (2.5 MG/3ML) 0.083% nebulizer solution, Take 3 mLs (2.5 mg total) by nebulization in the morning, at noon, in the evening, and at bedtime., Disp: 360 mL, Rfl: 3   benzonatate (TESSALON) 200 MG capsule, Take 200 mg by mouth 3 (three) times daily., Disp: , Rfl:    budesonide (PULMICORT) 0.25 MG/2ML nebulizer solution, Take 2 mLs (0.25 mg total) by nebulization in the morning, at noon, in the evening, and at bedtime., Disp: 360 mL, Rfl: 3   carvedilol (COREG) 12.5 MG tablet, Take  0.5 tablets (6.25 mg total) by mouth daily., Disp: 180 tablet, Rfl: 2   dicyclomine (BENTYL) 10 MG capsule, Take 1 capsule (10 mg total) by mouth every 8 (eight) hours as needed for spasms. (Patient taking differently: Take 10 mg by mouth 4 (four) times daily as needed for spasms. And at bedtime as needed), Disp: 60 capsule, Rfl: 1   EPINEPHrine 0.3 mg/0.3 mL IJ SOAJ injection, Inject 0.3 mg into the muscle as  needed for anaphylaxis., Disp: , Rfl:    ergocalciferol (VITAMIN D2) 1.25 MG (50000 UT) capsule, Take 50,000 Units by mouth once a week. Fridays, Disp: , Rfl:    famotidine (PEPCID) 20 MG tablet, One after supper, Disp: 30 tablet, Rfl: 11   LINZESS 290 MCG CAPS capsule, TAKE 1 CAPSULE BY MOUTH EVERY DAY BEFORE BREAKFAST, Disp: 30 capsule, Rfl: 3   montelukast (SINGULAIR) 10 MG tablet, TAKE 1 TABLET(10 MG) BY MOUTH AT BEDTIME, Disp: 30 tablet, Rfl: 1   potassium chloride SA (KLOR-CON M) 20 MEQ tablet, Take 1 tablet (20 mEq total) by mouth 2 (two) times daily., Disp: 60 tablet, Rfl: 1   Respiratory Therapy Supplies (FLUTTER) DEVI, 1 application by Does not apply route as directed., Disp: 1 each, Rfl: 0   rosuvastatin (CRESTOR) 10 MG tablet, Take 5 mg by mouth daily., Disp: , Rfl:    spironolactone (ALDACTONE) 25 MG tablet, TAKE 1 TABLET BY MOUTH EVERY DAY IN THE MORNING (Patient taking differently: 50 mg.), Disp: 90 tablet, Rfl: 1   topiramate (TOPAMAX) 25 MG tablet, Take 25 mg by mouth 2 (two) times daily., Disp: , Rfl:    traMADol (ULTRAM) 50 MG tablet, Take 1 tablet (50 mg total) by mouth every 6 (six) hours as needed., Disp: 15 tablet, Rfl: 0   Vonoprazan Fumarate (VOQUEZNA) 20 MG TABS, Take 1 tablet by mouth daily., Disp: 30 tablet, Rfl: 3    Allergies as of 11/12/2022 - Review Complete 11/12/2022  Allergen Reaction Noted   Sulfonamide derivatives Anaphylaxis, Swelling, Rash, and Other (See Comments) 10/21/2006   Doxycycline Nausea And Vomiting 01/05/2008   Latex Hives 09/28/2011   Ciprofloxacin Rash 10/21/2006   Fish allergy Rash and Other (See Comments) 03/08/2012   Hydrocortisone Rash    Neomycin Rash 10/21/2006    Past Medical History:  Diagnosis Date   Acute on chronic diastolic CHF (congestive heart failure) (HCC) 04/11/2014   Acute suppurative otitis media without spontaneous rupture of eardrum 02/04/2007   Centricity Description: OTITIS MEDIA, SUPPURATIVE, ACUTE, BILATERAL  Qualifier: Diagnosis of  By: Everardo All MD, Cleophas Dunker  Centricity Description: OTITIS MEDIA, SUPPURATIVE, ACUTE Qualifier: Diagnosis of  By: Everardo All MD, Sean A    Allergic rhinitis    Allergy    Anemia    Anxiety    Arthritis    Atherosclerosis of aorta (HCC) 02/21/2021   CAD (coronary artery disease)    LHC (11/29/1999):  EF 60%; no significant CAD.   Carpal tunnel syndrome    Carpal tunnel syndrome 06/09/2006   Qualifier: Diagnosis of  By: Charlsie Quest RMA, Lucy     Chronic diastolic CHF (congestive heart failure) (HCC)    a. Echo (01/21/11): Vigorous LVF, EF 65-70%, no RWMA, Gr 2 DD. b.  Echo (12/15): Moderate LVH, EF 60-65%, grade 2 diastolic dysfunction, mild LAE, normal RV function c. 01/2015: echo showing EF of 60-65% with mild LVH   Common migraine    COPD (chronic obstructive pulmonary disease) (HCC)    COVID-19    x 2   Diastolic  heart failure (HCC)    Diverticulitis    Dyspnea    Hepatic steatosis    Hepatomegaly    Hiatal hernia    Hyperlipidemia    IBS (irritable bowel syndrome)    Internal hemorrhoids    Long COVID    Metabolic syndrome    Morbid obesity (HCC)    PNEUMONIA, ORGAN UNSPECIFIED 03/01/2009   Qualifier: Diagnosis of  By: Yancey Flemings CMA, Jennifer     Sinusitis, chronic 03/10/2012   CT sinuses 02/2012:  Chronic sinusitis with small A/F levels >> rx by ENT with prolonged augmentin F/u ct sinuses 04/2012:  Persistent sinus thickening >> rx with levaquin and clinda for 30 days.  Told to return for sinus scan in 6 weeks >> no showed for followup visit.  CT sinuses 11/2013:  Acute and chronic sinusitis noted.      Past Surgical History:  Procedure Laterality Date   ABDOMINAL HYSTERECTOMY     BIOPSY  06/22/2018   Procedure: BIOPSY;  Surgeon: Napoleon Form, MD;  Location: [facility];  Service: Endoscopy;;   BIOPSY  07/28/2018   Procedure: BIOPSY;  Surgeon: Napoleon Form, MD;  Location: [facility];  Service: Endoscopy;;   CESAREAN SECTION     COLONOSCOPY      COLONOSCOPY WITH PROPOFOL N/A 05/05/2017   Procedure: COLONOSCOPY WITH PROPOFOL;  Surgeon: Napoleon Form, MD;  Location: [facility];  Service: Endoscopy;  Laterality: N/A;   COLONOSCOPY WITH PROPOFOL N/A 01/07/2022   Procedure: COLONOSCOPY WITH PROPOFOL;  Surgeon: Napoleon Form, MD;  Location: [facility];  Service: Gastroenterology;  Laterality: N/A;   ESOPHAGOGASTRODUODENOSCOPY (EGD) WITH PROPOFOL N/A 06/22/2018   Procedure: ESOPHAGOGASTRODUODENOSCOPY (EGD) WITH PROPOFOL;  Surgeon: Napoleon Form, MD;  Location: [facility];  Service: Endoscopy;  Laterality: N/A;   ESOPHAGOGASTRODUODENOSCOPY (EGD) WITH PROPOFOL N/A 07/28/2018   Procedure: ESOPHAGOGASTRODUODENOSCOPY (EGD) WITH PROPOFOL;  Surgeon: Napoleon Form, MD;  Location: [facility];  Service: Endoscopy;  Laterality: N/A;   ESOPHAGOGASTRODUODENOSCOPY (EGD) WITH PROPOFOL N/A 01/07/2022   Procedure: ESOPHAGOGASTRODUODENOSCOPY (EGD) WITH PROPOFOL;  Surgeon: Napoleon Form, MD;  Location: [facility];  Service: Gastroenterology;  Laterality: N/A;   NASAL SINUS SURGERY     PARTIAL HYSTERECTOMY     POLYPECTOMY  01/07/2022   Procedure: POLYPECTOMY;  Surgeon: Napoleon Form, MD;  Location: [facility];  Service: Gastroenterology;;   SINUS ENDO WITH FUSION Left 05/28/2019   Procedure: SINUS ENDO WITH FUSION;  Surgeon: Osborn Coho, MD;  Location[facility];  Service: ENT;  Laterality: Left;   TONSILLECTOMY     TUBAL LIGATION     TURBINATE REDUCTION Bilateral 05/28/2019   Procedure: TURBINATE REDUCTION;  Surgeon: Osborn Coho, MD;  Location: [facility] OR;  Service: ENT;  Laterality: Bilateral;   UVULOPALATOPHARYNGOPLASTY      Family History  Problem Relation Age of Onset   Heart attack Mother 28       1st one @ 64, 2nd one 24   Heart disease Mother    Hypertension Mother    Asthma Sister    Colon polyps Sister    Diabetes Sister    Diabetes Sister    Diabetes Brother    Lung cancer Brother    Diabetes  Brother    Diabetes Brother    Diabetes Brother    Diabetes Half-Sister    Stroke Neg Hx    Colon cancer Neg Hx    Esophageal cancer Neg Hx    Rectal cancer Neg Hx    Stomach cancer  Neg Hx    Pancreatic cancer Neg Hx     Social History   Socioeconomic History   Marital status: Widowed    Spouse name: Not on file   Number of children: 2   Years of education: Not on file   Highest education level: Not on file  Occupational History   Occupation: Retired  Tobacco Use   Smoking status: Never   Smokeless tobacco: Never  Vaping Use   Vaping status: Never Used  Substance and Sexual Activity   Alcohol use: No   Drug use: No   Sexual activity: Not on file  Other Topics Concern   Not on file  Social History Narrative   Physicist, medical, Lawyer.   Widowed, lives with son.   Social Determinants of Health   Financial Resource Strain: Low Risk  (06/02/2020)   Received from [facility] visits prior to 03/23/2022., [facility] visits prior to 03/23/2022.   Overall Financial Resource Strain (CARDIA)    Difficulty of Paying Living Expenses: Not hard at all  Food Insecurity: Low Risk  (10/08/2022)   [facility]   Hunger Vital Sign    Worried About Running Out of Food in the Last Year: Never true    Ran Out of Food in the Last Year: Never true  Transportation Needs: No Transportation Needs (10/08/2022)   Received from [employer]    In the past 12 months, has lack of reliable transportation kept you from medical appointments, meetings, work or from getting things needed for daily living? : No  Physical Activity: Inactive (06/02/2020)   Received from [facility] visits prior to 03/23/2022., [facility] visits prior to 03/23/2022.   Exercise Vital Sign    Days of Exercise per Week: 0 days    Minutes of Exercise per Session: 0 min  Stress: Stress Concern Present  (06/02/2020)   [facility] visits prior to 03/23/2022., [facility] visits prior to 03/23/2022.   [employer] of Occupational Health - Occupational Stress Questionnaire    Feeling of Stress : Very much  Social Connections: Moderately Integrated (06/02/2020)   Received from [facility] visits prior to 03/23/2022., [facility] visits prior to 03/23/2022.   Social Connection and Isolation Panel [NHANES]    Frequency of Communication with Friends and Family: Three times a week    Frequency of Social Gatherings with Friends and Family: Three times a week    Attends Religious Services: More than 4 times per year    Active Member of Clubs or Organizations: Yes    Attends Banker Meetings: More than 4 times per year    Marital Status: Widowed  Intimate Partner Violence: Not At Risk (06/02/2020)   Received from [facility] visits prior to 03/23/2022., [facility] visits prior to 03/23/2022.   Humiliation, Afraid, Rape, and Kick questionnaire    Fear of Current or Ex-Partner: No    Emotionally Abused: No    Physically Abused: No    Sexually Abused: No     Review of systems: Review of Systems  Constitutional:  Negative for unexpected weight change.  HENT:  Positive for trouble swallowing.   Gastrointestinal:  Negative for abdominal distention, abdominal pain, anal bleeding, blood in stool, constipation, diarrhea, nausea, rectal pain and vomiting.       +  reflux      Physical Exam: Vitals:   11/12/22 1510  BP: 120/80  Pulse: (!) 49    Body mass index is 40.39 kg/m. General: well-appearing   Eyes: sclera anicteric, no redness ENT: oral mucosa moist without lesions, no cervical or supraclavicular lymphadenopathy CV: RRR, no JVD, no peripheral edema Resp: clear to auscultation bilaterally, normal RR and effort noted GI: soft, no  tenderness, with active bowel sounds. No guarding or palpable organomegaly noted. Skin; warm and dry, no rash or jaundice noted Neuro: awake, alert and oriented x 3. Normal gross motor function and fluent speech   Data Reviewed:  Reviewed labs, radiology imaging, old records and pertinent past GI work up   Assessment and Plan/Recommendations:  62 year old very pleasant female with morbid obesity, hypertension, hyperlipidemia, COPD, OSA, diastolic dysfunction,  here for follow-up for chronic GERD with recent exacerbation of symptoms She is experiencing intermittent epigastric and left upper quadrant abdominal pain     Gastroesophageal Reflux Disease (GERD) Vocal cord swelling and white spots on esophagus suggestive of severe acid reflux per ENT. Patient prefers Dexilant over current medication. -Switch back to Dexilant 60mg  daily. Continue lifestyle modification and antireflux measures -Plan esophageal manometry and pH study to assess esophageal function and acid reflux severity.  Weight Loss Patient has made significant progress in weight loss and is encouraged to continue efforts. Discussed potential for liposuction but advised against due to potential complications and the fact that patient does not have saggy skin. -Encourage continuation of current diet and exercise regimen. -Recommend asking physical therapist for exercises targeting abdominal area.   Follow-up Plan to see patient in office after esophageal manometry and pH study.       This visit required 40 minutes of patient care (this includes precharting, chart review, review of results, face-to-face time used for counseling as well as treatment plan and follow-up. The patient was provided an opportunity to ask questions and all were answered. The patient agreed with the plan and demonstrated an understanding of the instructions.  Iona Beard , MD  Ladona Mow [employer] as a scribe for Marsa Aris, MD.,have  documented all relevant documentation on the behalf of Marsa Aris, MD,as directed by  Marsa Aris, MD while in the presence of Marsa Aris, MD.   I, Marsa Aris, MD, have reviewed all documentation for this visit. The documentation on 11/12/22 for the exam, diagnosis, procedures, and orders are all accurate and complete.    CC: Verlon Au, MD

## 2022-11-12 NOTE — Patient Instructions (Signed)
VISIT SUMMARY:  During your visit, we discussed your ongoing issues with acid reflux, vocal cord swelling, and persistent mucus production. We also talked about your recent weight loss and upcoming cataract surgery. I understand that you have been dealing with emotional challenges due to the recent loss of your mother, and I commend your commitment to continue your weight loss journey.  YOUR PLAN:  -ACID REFLUX: Acid reflux, also known as Gastroesophageal Reflux Disease (GERD), is a condition where stomach acid frequently flows back into the tube connecting your mouth and stomach (esophagus). This backwash (acid reflux) can irritate the lining of your esophagus. We will switch back to Dexilant 60mg  daily, which you prefer. We also plan to conduct an esophageal manometry and pH study to assess the function of your esophagus and the severity of your acid reflux.  -WEIGHT LOSS: You have made significant progress in your weight loss journey, and I encourage you to continue with your current diet and exercise regimen. I recommend asking your physical therapist for exercises that target the abdominal area.  -CATARACT SURGERY: You have an upcoming cataract surgery scheduled for November 21st. We will ensure that the scheduling of your esophageal manometry and pH study does not conflict with this surgery.  INSTRUCTIONS:  Please continue taking Dexilant 60mg  daily for your acid reflux. We will schedule an esophageal manometry and pH study to further evaluate your condition. Continue with your current diet and exercise regimen for weight loss, and consider asking your physical therapist for exercises that target the abdominal area. Ensure that the scheduling of your esophageal manometry and pH study does not conflict with your upcoming cataract surgery. We plan to see you in the office after your esophageal manometry and pH study.  You have been scheduled for an esophageal manometry at [facility] on  03/04/2022 at 12:30pm. Please arrive 30 minutes prior to your procedure for registration. You will need to go to outpatient registration (1st floor of the hospital) first. Make certain to bring your insurance cards as well as a complete list of medications.  Please remember the following:  1) Do not take any muscle relaxants, xanax (alprazolam) or ativan for 1 day prior to your test as well as the day of the test.  2) Nothing to eat or drink for 8 hours before your test.  3) Hold all diabetic medications/insulin the morning of the test. You may eat and take your medications after the test.  It will take at least 2 weeks to receive the results of this test from your physician. ------------------------------------------ ABOUT ESOPHAGEAL MANOMETRY Esophageal manometry (muh-NOM-uh-tree) is a test that gauges how well your esophagus works. Your esophagus is the long, muscular tube that connects your throat to your stomach. Esophageal manometry measures the rhythmic muscle contractions (peristalsis) that occur in your esophagus when you swallow. Esophageal manometry also measures the coordination and force exerted by the muscles of your esophagus.  During esophageal manometry, a thin, flexible tube (catheter) that contains sensors is passed through your nose, down your esophagus and into your stomach. Esophageal manometry can be helpful in diagnosing some mostly uncommon disorders that affect your esophagus.  Why it's done Esophageal manometry is used to evaluate the movement (motility) of food through the esophagus and into the stomach. The test measures how well the circular bands of muscle (sphincters) at the top and bottom of your esophagus open and close, as well as the pressure, strength and pattern of the wave of esophageal muscle contractions that  moves food along.  What you can expect Esophageal manometry is an outpatient procedure done without sedation. Most people tolerate it well. You may be  asked to change into a hospital gown before the test starts.  During esophageal manometry  While you are sitting up, a member of your health care team sprays your throat with a numbing medication or puts numbing gel in your nose or both.  A catheter is guided through your nose into your esophagus. The catheter may be sheathed in a water-filled sleeve. It doesn't interfere with your breathing. However, your eyes may water, and you may gag. You may have a slight nosebleed from irritation.  After the catheter is in place, you may be asked to lie on your back on an exam table, or you may be asked to remain seated.  You then swallow small sips of water. As you do, a computer connected to the catheter records the pressure, strength and pattern of your esophageal muscle contractions.  During the test, you'll be asked to breathe slowly and smoothly, remain as still as possible, and swallow only when you're asked to do so.  A member of your health care team may move the catheter down into your stomach while the catheter continues its measurements.  The catheter then is slowly withdrawn. The test usually lasts 20 to 30 minutes.  After esophageal manometry  When your esophageal manometry is complete, you may return to your normal activities  This test typically takes 30-45 minutes to complete. ________________________________________________________________________________  I appreciate the  opportunity to care for you  Thank You   Marsa Aris , MD

## 2022-11-15 ENCOUNTER — Encounter: Payer: Self-pay | Admitting: Gastroenterology

## 2022-11-15 NOTE — Progress Notes (Signed)
Moderate to severe OSA. Can you move her appt up (currently scheduled with [name] in December) to 11/8 or 11/22 in PM virtual visit slot? I'm in [place] that day but she won't have to travel with the video visit! Thanks!

## 2022-11-19 ENCOUNTER — Ambulatory Visit
Admission: RE | Admit: 2022-11-19 | Discharge: 2022-11-19 | Disposition: A | Discharge status: Home / Self Care | Payer: 59 | Source: Ambulatory Visit | Attending: Family Medicine | Admitting: Family Medicine

## 2022-11-19 DIAGNOSIS — Z1231 Encounter for screening mammogram for malignant neoplasm of breast: Secondary | ICD-10-CM

## 2022-11-29 ENCOUNTER — Encounter: Payer: Self-pay | Admitting: Nurse Practitioner

## 2022-11-29 ENCOUNTER — Telehealth (INDEPENDENT_AMBULATORY_CARE_PROVIDER_SITE_OTHER): Payer: 59 | Admitting: Nurse Practitioner

## 2022-11-29 DIAGNOSIS — R058 Other specified cough: Secondary | ICD-10-CM

## 2022-11-29 DIAGNOSIS — Z6841 Body Mass Index (BMI) 40.0 and over, adult: Secondary | ICD-10-CM | POA: Diagnosis not present

## 2022-11-29 DIAGNOSIS — G4739 Other sleep apnea: Secondary | ICD-10-CM

## 2022-11-29 DIAGNOSIS — G4733 Obstructive sleep apnea (adult) (pediatric): Secondary | ICD-10-CM

## 2022-11-29 NOTE — Progress Notes (Signed)
Patient ID: [name], female     DOB: 18-Sep-1960, 62 y.o.      MRN: [id number]  No chief complaint on file.   Virtual Visit via Video Note  I connected with [name] on 11/29/22 at  2:30 PM EST by a video enabled telemedicine application and verified that I am speaking with the correct person using two identifiers.  Location: Patient: Home Provider: Office   I discussed the limitations of evaluation and management by telemedicine and the availability of in person appointments. The patient expressed understanding and agreed to proceed.  History of Present Illness: 62 year old female, never smoker followed for OSA on BiPAP, chronic bronchitis, and upper airway cough syndrome. She is followed by [name] for sleep and [name] for UACS. Past medical history significant for HTN, CHF, IBS, CKD, HLD, chronic sinusitis with previous sinus surgery 05/2019.   TEST/EVENTS:  03/11/2018 [name]: FVC 82, FEV1 98, ratio 93 05/15/2022 CT chest without contrast: Heart is enlarged.  Mild atherosclerosis.  Tiny hiatal hernia.  2.1 cm right thyroid nodule, evaluated on previous imaging.  5 mm right middle lobe nodule, new in interval.  No need for follow-up if patient is low risk.  Dependent atelectasis noted in both lung bases.  10 mm left adrenal nodule is stable compared to 2021 and considered benign. 11/04/2022 HST: AHI 24/h. Mixed obstructive and central events. SpO2 low 82%   05/09/2022: Ov with [name] COVID in 2020>> long term COVID with persistent cough and sinus symptoms May 2021 >> sinus surgery with ENT with progressive PND post procedure COVID in December 2022 and hospitalized in [place] >> chronic symptoms unchanged and recovered to baseline   Patient presents today for acute visit. There was some initial confusion regarding her concerns. She had told the CMA that she was frustrated by her cough/mucus production and that she had not had much workup for this. Upon review of her  chart, she has had extensive workup, sinus surgery, and challenged with numerous medications. She also was concerned about some chest discomfort she had been experiencing, which she had told the CMA she saw cardiology in November and nothing was found to be wrong.  After further discussion between the patient and myself, her chronic symptoms of cough and postnasal drainage are unchanged compared to previous visits, dating back years. She does have new chest discomfort, which started the beginning of this month. She was not sure what caused the discomfort but went to see her PCP on 4/11. She tells me she did not receive much of a workup, in her opinion. No EKG or chest imaging was obtained. She was advised to see [place] for further evaluation. She tells me that the pain is intermittent and primarily dull but occasionally sharp, stabbing pains. It is located on the right side of her chest and does not radiate. She describes it as a 7-8/10 when it occurs. No injury or trauma. No correlation with activity. Cough is not significant increased or more violent. She denies any increased dyspnea, palpitations, dizziness/lightheadedness, orthopnea, leg swelling, calf pain, PND, N/V, wheezing, fevers. She has lost weight since May 2023; down from around 295 lb to 229 lb. She is actively working on weight loss and changed her diet. Appetite is good. She does tell me that she was seeing an ENT up [place] but it has been around 1 year since she saw them last. She has not re-established with someone local.   05/20/2022: OV  with [name].  Acute visit.  Having right pleuritic chest pain.  Having difficulties with shortness of breath and cough when lying down.  Trial of Trelegy.  May be aggravating upper airway cough syndrome.  CHF and thyroid disease ruled out.  PE very unlikely.  D-dimer high normal.  No need for CTA chest.  Depo 120 mg x 1.  06/03/2022: OV with Dr. Celine [name].  Presents for persistent cough, chest tightness and chest  burning.  Was previously given Augmentin for cough without improvement.  She does take Delsym or Mucinex DM.  Takes allergy pills.  Has been on budesonide nebs for about a month but do not seem to be helping.  Prednisone is also not helped cough.  Advised to continue with GERD control measures and follow-up with GI.  Clinically no evidence of pneumonia.  No recommendation for further antibiotics or inhalers as they have not been helpful in the past.  06/19/2022: OV with [name].  States evaluation of sinuses with nasal endoscopy.  Normal postop changes.  No evidence of infection or source for the cough.  Does feel like prednisone helped the cough.  Recommended budesonide twice daily nebs.  Hold other inhalers as they may exacerbate the cough.  Prednisone taper.  08/09/2022: OV with [name].  Changed her diet and is exercising more.  She has been able to lose 80 pounds.  Feels much better.  Breathing is better and her knees do not hurt anymore.  She tries to use BiPAP but mask no longer fits well and pressure feels too high.  Her BiPAP is more than 64 years old.  Plan to arrange for home sleep study.  11/29/2022: Today-follow-up Patient presents today for follow-up.  Breathing has been doing well since she was seen last.  She is not really having any issues with her cough.  Has not required any more steroids since her visit in May 2024 with [name].  She is using her neb treatments as needed.  Reflux feels well-controlled.  No wheezing, fevers, chills, lower extremity swelling, orthopnea. She had a sleep study that revealed moderate sleep apnea with mixed obstructive and central events.  She does feel tired during the day.  Does not tend to sleep very well at night.  She usually gets better on 11 PM and is up around 3 AM with the inability to go back to sleep.  Denies any issues with drowsy driving.  No sleep parasomnia/paralysis.  Open to resuming therapy but was very uncomfortable on the BiPAP last time.   She is going to be out of the state for the next month and a half and does not want to worry about things until she gets back.  Understands risks of untreated sleep apnea.  Allergies  Allergen Reactions   Sulfonamide Derivatives Anaphylaxis, Swelling, Rash and Other (See Comments)    Swelling tongue and ear    Doxycycline Nausea And Vomiting   Latex Hives   Ciprofloxacin Rash   Fish Allergy Rash and Other (See Comments)    Only reaction to Mackerel.  No other issues with any type of fish or shellfish currently    Hydrocortisone Rash   Neomycin Rash   Immunization History  Administered Date(s) Administered   Influenza Split 11/21/2013, 11/21/2015, 10/21/2016   Influenza,inj,Quad PF,6+ Mos 02/23/2015, 11/21/2015, 11/11/2018   Influenza-Unspecified 11/25/2013, 02/23/2015, 10/21/2016, 11/11/2018   Moderna Covid-19 Fall Seasonal Vaccine 31yrs & older 12/19/2021   PFIZER(Purple Top)SARS-COV-2 Vaccination 09/28/2019, 11/03/2019, 05/17/2020   Pfizer  Covid-19 Vaccine Bivalent Booster 67yrs & up 12/06/2020   Pneumococcal Polysaccharide-23 10/30/2005, 11/23/2015   Td 08/30/2005   Past Medical History:  Diagnosis Date   Acute on chronic diastolic CHF (congestive heart failure) (HCC) 04/11/2014   Acute suppurative otitis media without spontaneous rupture of eardrum 02/04/2007   Centricity Description: OTITIS MEDIA, SUPPURATIVE, ACUTE, BILATERAL Qualifier: Diagnosis of  By: Everardo All MD, Cleophas Dunker  Centricity Description: OTITIS MEDIA, SUPPURATIVE, ACUTE Qualifier: Diagnosis of  By: Everardo All MD, Sean A    Allergic rhinitis    Allergy    Anemia    Anxiety    Arthritis    Atherosclerosis of aorta (HCC) 02/21/2021   CAD (coronary artery disease)    LHC (11/29/1999):  EF 60%; no significant CAD.   Carpal tunnel syndrome    Carpal tunnel syndrome 06/09/2006   Qualifier: Diagnosis of  By: Charlsie Quest RMA, Lucy     Chronic diastolic CHF (congestive heart failure) (HCC)    a. Echo (01/21/11): Vigorous LVF, EF  65-70%, no RWMA, Gr 2 DD. b.  Echo (12/15): Moderate LVH, EF 60-65%, grade 2 diastolic dysfunction, mild LAE, normal RV function c. 01/2015: echo showing EF of 60-65% with mild LVH   Common migraine    COPD (chronic obstructive pulmonary disease) (HCC)    COVID-19    x 2   Diastolic heart failure (HCC)    Diverticulitis    Dyspnea    Hepatic steatosis    Hepatomegaly    Hiatal hernia    Hyperlipidemia    IBS (irritable bowel syndrome)    Internal hemorrhoids    Long COVID    Metabolic syndrome    Morbid obesity (HCC)    PNEUMONIA, ORGAN UNSPECIFIED 03/01/2009   Qualifier: Diagnosis of  By: Yancey Flemings CMA, Jennifer     Sinusitis, chronic 03/10/2012   CT sinuses 02/2012:  Chronic sinusitis with small A/F levels >> rx by ENT with prolonged augmentin F/u ct sinuses 04/2012:  Persistent sinus thickening >> rx with levaquin and clinda for 30 days.  Told to return for sinus scan in 6 weeks >> no showed for followup visit.  CT sinuses 11/2013:  Acute and chronic sinusitis noted.      Tobacco History: Social History   Tobacco Use  Smoking Status Never  Smokeless Tobacco Never   Counseling given: Not Answered   Outpatient Medications Prior to Visit  Medication Sig Dispense Refill   acetaminophen (TYLENOL) 650 MG CR tablet Take 650 mg by mouth every 8 (eight) hours as needed for pain (headache).     albuterol (PROVENTIL) (2.5 MG/3ML) 0.083% nebulizer solution Take 3 mLs (2.5 mg total) by nebulization in the morning, at noon, in the evening, and at bedtime. 360 mL 3   benzonatate (TESSALON) 200 MG capsule Take 200 mg by mouth 3 (three) times daily.     budesonide (PULMICORT) 0.25 MG/2ML nebulizer solution Take 2 mLs (0.25 mg total) by nebulization in the morning, at noon, in the evening, and at bedtime. 360 mL 3   carvedilol (COREG) 12.5 MG tablet Take 0.5 tablets (6.25 mg total) by mouth daily. 180 tablet 2   dexlansoprazole (DEXILANT) 60 MG capsule Take 1 capsule (60 mg total) by mouth  daily. 90 capsule 3   dicyclomine (BENTYL) 10 MG capsule Take 1 capsule (10 mg total) by mouth every 8 (eight) hours as needed for spasms. (Patient taking differently: Take 10 mg by mouth 4 (four) times daily as needed for spasms. And at bedtime as needed) 60  capsule 1   EPINEPHrine 0.3 mg/0.3 mL IJ SOAJ injection Inject 0.3 mg into the muscle as needed for anaphylaxis.     ergocalciferol (VITAMIN D2) 1.25 MG (50000 UT) capsule Take 50,000 Units by mouth once a week. Fridays     famotidine (PEPCID) 20 MG tablet One after supper 30 tablet 11   LINZESS 290 MCG CAPS capsule TAKE 1 CAPSULE BY MOUTH EVERY DAY BEFORE BREAKFAST 30 capsule 3   montelukast (SINGULAIR) 10 MG tablet TAKE 1 TABLET(10 MG) BY MOUTH AT BEDTIME 30 tablet 1   potassium chloride SA (KLOR-CON M) 20 MEQ tablet Take 1 tablet (20 mEq total) by mouth 2 (two) times daily. 60 tablet 1   Respiratory Therapy Supplies (FLUTTER) DEVI 1 application by Does not apply route as directed. 1 each 0   rosuvastatin (CRESTOR) 10 MG tablet Take 5 mg by mouth daily.     spironolactone (ALDACTONE) 25 MG tablet TAKE 1 TABLET BY MOUTH EVERY DAY IN THE MORNING (Patient taking differently: 50 mg.) 90 tablet 1   topiramate (TOPAMAX) 25 MG tablet Take 25 mg by mouth 2 (two) times daily.     traMADol (ULTRAM) 50 MG tablet Take 1 tablet (50 mg total) by mouth every 6 (six) hours as needed. 15 tablet 0   Vonoprazan Fumarate (VOQUEZNA) 20 MG TABS Take 1 tablet by mouth daily. 30 tablet 3   No facility-administered medications prior to visit.     Review of Systems:   Constitutional: No weight loss or gain, night sweats, fevers, chills, or lassitude. +fatigue  HEENT: No headaches, difficulty swallowing, tooth/dental problems, or sore throat. No sneezing, itching, ear ache. +chronic nasal congestion CV:  No chest pain, orthopnea, PND, swelling in lower extremities, anasarca, dizziness, palpitations, syncope Resp: +baseline shortness of breath with exertion. No  excess mucus or change in color of mucus. No productive or non-productive. No hemoptysis. No wheezing.  No chest wall deformity GI:  No heartburn, indigestion, abdominal pain, nausea, vomiting, diarrhea, change in bowel habits, loss of appetite, bloody stools.  GU: No dysuria, change in color of urine, urgency or frequency.  Skin: No rash, lesions, ulcerations MSK:  No joint pain or swelling.   Neuro: No dizziness or lightheadedness.  Psych: No depression or anxiety. Mood stable. +sleep disturbance  Observations/Objective: Patient is well-developed, well-nourished in no acute distress. A&Ox3. Resting comfortably at home. Unlabored breathing. Speech is clear and coherent with logical content.    Assessment and Plan: Obstructive sleep apnea Obstructive and central sleep apnea with AHI 24/h.  Given mixed events, recommend CPAP titration study to ensure proper control.  Concerned that initiating auto titrating CPAP at home could reduce more central emergence.  May need to continue BiPAP therapy with adjusted settings.  Shared decision to continue with PAP therapy; however, she wants to postpone until she gets back from her trip to [place] the beginning of January 2025.  Understands risks of untreated sleep apnea.  Advised to use caution when driving and pull over if she becomes sleepy.  Encouraged continued healthy weight loss measures.  Patient Instructions  Continue budesonide 2 ml Twice daily as needed for shortness of breath, wheezing, cough Continue Albuterol inhaler 2 puffs or 3 mL neb every 6 hours as needed for shortness of breath or wheezing. Notify if symptoms persist despite rescue inhaler/neb use. Continue Dexilant 1 tab daily Continue Singulair 1 tab nightly  Your sleep study showed mixed obstructive and central sleep apnea.  You will need a CPAP titration  study for further evaluation and to ensure that you are controlled well on therapy.  Someone will contact you for scheduling  this.  We discussed how untreated sleep apnea puts an individual at risk for cardiac arrhthymias, pulm HTN, DM, stroke and increases their risk for daytime accidents. We also briefly reviewed treatment options including weight loss, side sleeping position, oral appliance, CPAP therapy or referral to ENT for possible surgical options   Continue with healthy weight loss measures  Follow up in 12 weeks with Dr. Wynona Neat or Florentina Addison Phillp Dolores,[name] to review CPAP titration. Follow up with [name] as scheduled. If symptoms do not improve or worsen, please contact office for sooner follow up or seek emergency care.    Upper airway cough syndrome Clinically stable with improvement since May 2024.  No significant cough, dyspnea or wheezing.  Encouraged her to continue current regimen.  Action plan in place should symptoms worsen.  Avoid known triggers.  Obesity, Class III, BMI 40-49.9 (morbid obesity) (HCC) BMI 40. Continued healthy weight loss encouraged    I discussed the assessment and treatment plan with the patient. The patient was provided an opportunity to ask questions and all were answered. The patient agreed with the plan and demonstrated an understanding of the instructions.   The patient was advised to call back or seek an in-person evaluation if the symptoms worsen or if the condition fails to improve as anticipated.  I provided 31 minutes of non-face-to-face time during this encounter.   Noemi Chapel, [name]

## 2022-11-29 NOTE — Assessment & Plan Note (Signed)
Clinically stable with improvement since May 2024.  No significant cough, dyspnea or wheezing.  Encouraged her to continue current regimen.  Action plan in place should symptoms worsen.  Avoid known triggers.

## 2022-11-29 NOTE — Assessment & Plan Note (Signed)
Obstructive and central sleep apnea with AHI 24/h.  Given mixed events, recommend CPAP titration study to ensure proper control.  Concerned that initiating auto titrating CPAP at home could reduce more central emergence.  May need to continue BiPAP therapy with adjusted settings.  Shared decision to continue with PAP therapy; however, she wants to postpone until she gets back from her trip to [place] the beginning of January 2025.  Understands risks of untreated sleep apnea.  Advised to use caution when driving and pull over if she becomes sleepy.  Encouraged continued healthy weight loss measures.  Patient Instructions  Continue budesonide 2 ml Twice daily as needed for shortness of breath, wheezing, cough Continue Albuterol inhaler 2 puffs or 3 mL neb every 6 hours as needed for shortness of breath or wheezing. Notify if symptoms persist despite rescue inhaler/neb use. Continue Dexilant 1 tab daily Continue Singulair 1 tab nightly  Your sleep study showed mixed obstructive and central sleep apnea.  You will need a CPAP titration study for further evaluation and to ensure that you are controlled well on therapy.  Someone will contact you for scheduling this.  We discussed how untreated sleep apnea puts an individual at risk for cardiac arrhthymias, pulm HTN, DM, stroke and increases their risk for daytime accidents. We also briefly reviewed treatment options including weight loss, side sleeping position, oral appliance, CPAP therapy or referral to ENT for possible surgical options   Continue with healthy weight loss measures  Follow up in 12 weeks with Dr. Wynona Neat or Florentina Addison Jayley Hustead,NP to review CPAP titration. Follow up with Dr. Sherene Sires as scheduled. If symptoms do not improve or worsen, please contact office for sooner follow up or seek emergency care.

## 2022-11-29 NOTE — Assessment & Plan Note (Signed)
BMI 40. Continued healthy weight loss encouraged

## 2022-11-29 NOTE — Patient Instructions (Signed)
Continue budesonide 2 ml Twice daily as needed for shortness of breath, wheezing, cough Continue Albuterol inhaler 2 puffs or 3 mL neb every 6 hours as needed for shortness of breath or wheezing. Notify if symptoms persist despite rescue inhaler/neb use. Continue Dexilant 1 tab daily Continue Singulair 1 tab nightly  Your sleep study showed mixed obstructive and central sleep apnea.  You will need a CPAP titration study for further evaluation and to ensure that you are controlled well on therapy.  Someone will contact you for scheduling this.  We discussed how untreated sleep apnea puts an individual at risk for cardiac arrhthymias, pulm HTN, DM, stroke and increases their risk for daytime accidents. We also briefly reviewed treatment options including weight loss, side sleeping position, oral appliance, CPAP therapy or referral to ENT for possible surgical options   Continue with healthy weight loss measures  Follow up in 12 weeks with Dr. Wynona Neat or Florentina Addison Kenzleigh Sedam,NP to review CPAP titration. Follow up with Dr. Sherene Sires as scheduled. If symptoms do not improve or worsen, please contact office for sooner follow up or seek emergency care.

## 2022-12-12 ENCOUNTER — Telehealth: Payer: Self-pay | Admitting: Student

## 2022-12-12 NOTE — Telephone Encounter (Signed)
PT calling because [facility] sleep study we arranged is not a good date or location. She needs it in [place] because of transportation issues and after the 20th of January.  Please call to resched. Her # is [phone]  States when she was on the virtual visit NP had this information in her notes.

## 2022-12-24 NOTE — Telephone Encounter (Signed)
The PA will need to be done for [name] if the patient wants to schedule there

## 2022-12-25 NOTE — Telephone Encounter (Signed)
I have faxed the records trying to get the patient approved for [facility]

## 2022-12-26 NOTE — Telephone Encounter (Signed)
I have spoke with [name] and her in lab sleep study has been scheduled on 1/60/2025 at [facility] Auth # [id number] valid 12/25/2022 tin 06/25/2023. This is the date that she chose

## 2022-12-30 ENCOUNTER — Ambulatory Visit: Payer: 59 | Admitting: Adult Health

## 2023-01-01 ENCOUNTER — Telehealth: Payer: Self-pay | Admitting: Gastroenterology

## 2023-01-01 ENCOUNTER — Other Ambulatory Visit: Payer: Self-pay | Admitting: Gastroenterology

## 2023-01-01 NOTE — Telephone Encounter (Signed)
I received an electronic refill request from that pharmacy in [place] today. And refilled her Linzess 290 mcg electronically already

## 2023-01-01 NOTE — Telephone Encounter (Signed)
Inbound call from patient stating that she needs a refill for Linzess. Patient states she is in [place] and is requesting refill be sent to [facility] on [address] in [place]. Please advise.  Pharmacy number: [phone]

## 2023-01-08 ENCOUNTER — Encounter (HOSPITAL_BASED_OUTPATIENT_CLINIC_OR_DEPARTMENT_OTHER): Payer: 59 | Admitting: Internal Medicine

## 2023-02-15 ENCOUNTER — Other Ambulatory Visit: Payer: Self-pay | Admitting: Internal Medicine

## 2023-02-21 ENCOUNTER — Ambulatory Visit (HOSPITAL_BASED_OUTPATIENT_CLINIC_OR_DEPARTMENT_OTHER): Payer: 59 | Attending: Nurse Practitioner | Admitting: Internal Medicine

## 2023-02-21 ENCOUNTER — Encounter (HOSPITAL_BASED_OUTPATIENT_CLINIC_OR_DEPARTMENT_OTHER): Payer: 59 | Admitting: Internal Medicine

## 2023-02-21 VITALS — Ht 63.0 in | Wt 220.0 lb

## 2023-02-21 DIAGNOSIS — G4739 Other sleep apnea: Secondary | ICD-10-CM | POA: Insufficient documentation

## 2023-02-21 DIAGNOSIS — G4733 Obstructive sleep apnea (adult) (pediatric): Secondary | ICD-10-CM | POA: Diagnosis not present

## 2023-02-25 ENCOUNTER — Telehealth: Payer: Self-pay | Admitting: Gastroenterology

## 2023-02-25 NOTE — Progress Notes (Signed)
 Cardiology Office Note:  .   Date:  02/26/2023  ID:  [name], DOB 06-13-1960, MRN [id number] PCP: Jolee Madelin Patch, MD  [facility] Providers Cardiologist:  Gordy Bergamo, MD   History of Present Illness: .   [name] is a 63 y.o. African-American female patient with chronic diastolic heart failure, normal coronary arteries by remote catheterization and also by coronary CTA in September 2022, primary hypertension, chronic hypokalemia, stage IIIa chronic kidney disease, hyperglycemia, hyperlipidemia, morbid obesity, moderate to severe OSA on BiPAP, IBS and GERD, Dr. Almarie Bonine for management of renal disease presents for follow-up of heart failure, hypertension management.  Last cardiac presentation was in September 2024 when she presented to the ED with syncope after she hit her head during she also uses her mother and presented the following day to the ED for headache evaluation.  CT of the head was negative for any bleed.  Patient has been losing weight significantly from BMI of 50 has come down to 40.  She has no specific complaints today, essentially remains asymptomatic and she is very proud that she has lost weight and wants to have very large pannus operated upon and excised.  She has not had any further dizziness or syncope.  From cardiac standpoint she remains asymptomatic. GERD continues to be an issue and there are plans for esophageal manometry  Lab Results  Component Value Date   NA 145 (H) 02/26/2023   K 3.5 02/26/2023   CO2 24 02/26/2023   GLUCOSE 111 (H) 02/26/2023   BUN 21 02/26/2023   CREATININE 1.05 (H) 02/26/2023   CALCIUM  9.7 02/26/2023   GFR 28.15 (L) 04/10/2021   EGFR 59 (L) 02/26/2023   GFRNONAA >60 10/11/2022      Latest Ref Rng & Units 02/26/2023    9:20 AM 10/11/2022   10:30 AM 05/15/2022    9:40 AM  BMP  Glucose 70 - 99 mg/dL 888  889  93   BUN 8 - 27 mg/dL 21  20  23    Creatinine 0.57 - 1.00 mg/dL 8.94  9.29  9.12   BUN/Creat Ratio  12 - 28 20     Sodium 134 - 144 mmol/L 145  139  137   Potassium 3.5 - 5.2 mmol/L 3.5  2.6  4.1   Chloride 96 - 106 mmol/L 103  101  107   CO2 20 - 29 mmol/L 24  27  22    Calcium  8.7 - 10.3 mg/dL 9.7  8.8  8.7       Latest Ref Rng & Units 10/11/2022   10:30 AM 05/20/2022    3:14 PM 05/15/2022    9:40 AM  CBC  WBC 4.0 - 10.5 K/uL 8.4  12.2  13.1   Hemoglobin 12.0 - 15.0 g/dL 88.1  87.2  86.8   Hematocrit 36.0 - 46.0 % 36.7  37.9  41.7   Platelets 150 - 400 K/uL 221  267.0  272    External Labs:  Care Everywhere labs 10/08/2022:  A1c 6.1%.  Sodium 143, potassium 3.7, BUN 22, creatinine 0.85, EGFR 77 mL.  Lipid profile 03/13/2022:  Total cholesterol 123, triglycerides 91, HDL 46, LDL 59.  Review of Systems  Cardiovascular:  Negative for chest pain, dyspnea on exertion and leg swelling.    Physical Exam:   VS:  BP 110/82 (BP Location: Left Arm, Patient Position: Sitting, Cuff Size: Large)   Pulse 90   Resp 16   Ht 5' 3 (  1.6 m)   Wt 230 lb (104.3 kg)   SpO2 97%   BMI 40.74 kg/m    Wt Readings from Last 3 Encounters:  02/26/23 230 lb (104.3 kg)  02/21/23 220 lb (99.8 kg)  11/12/22 228 lb (103.4 kg)    Physical Exam Neck:     Vascular: No carotid bruit or JVD.  Cardiovascular:     Rate and Rhythm: Normal rate and regular rhythm.     Pulses: Intact distal pulses.     Heart sounds: Normal heart sounds. No murmur heard.    No gallop.  Pulmonary:     Effort: Pulmonary effort is normal.     Breath sounds: Normal breath sounds.  Abdominal:     General: Bowel sounds are normal.     Palpations: Abdomen is soft.     Comments: Large pannus present  Musculoskeletal:     Right lower leg: No edema.     Left lower leg: No edema.     Studies Reviewed: SABRA    Sleep study 11/04/2022: Moderate to severe OSA.   ECHOCARDIOGRAM COMPLETE 03/23/2019  1. Left ventricular ejection fraction, by estimation, is >75%. The left ventricle has hyperdynamic function. The left ventricle has  no regional wall motion abnormalities. There is moderate left ventricular hypertrophy. Left ventricular diastolic parameters are indeterminate. 2. Right ventricular systolic function was not well visualized. The right ventricular size is not well visualized.  EKG:    EKG Interpretation Date/Time:  Wednesday February 26 2023 08:37:22 EST Ventricular Rate:  80 PR Interval:  184 QRS Duration:  92 QT Interval:  390 QTC Calculation: 449 R Axis:   -27  Text Interpretation: EKG 02/26/2023: Normal sinus rhythm at rate of 80 bpm, anterior infarct old.  Low-voltage complexes.  Pulmonary disease pattern.  Compared to 10/11/2022, no change. Confirmed by Gisela Lea, Jagadeesh [phone]) on 02/26/2023 8:48:00 AM    EKG 06/14/2021: Normal sinus rhythm at rate of 63 bpm, normal axis, poor R wave progression, cannot exclude anteroseptal infarct old. Low-voltage complexes.   Medications and allergies    Allergies  Allergen Reactions   Sulfonamide Derivatives Anaphylaxis, Swelling, Rash and Other (See Comments)    Swelling tongue and ear    Doxycycline  Nausea And Vomiting   Latex Hives   Ciprofloxacin Rash   Fish Allergy  Rash and Other (See Comments)    Only reaction to Mackerel.  No other issues with any type of fish or shellfish currently    Hydrocortisone Rash   Neomycin Rash     Current Outpatient Medications:    acetaminophen  (TYLENOL ) 650 MG CR tablet, Take 650 mg by mouth every 8 (eight) hours as needed for pain (headache)., Disp: , Rfl:    acetaminophen -codeine  (TYLENOL  #3) 300-30 MG tablet, Take 1 tablet by mouth every 4 (four) hours as needed., Disp: , Rfl:    albuterol  (PROVENTIL ) (2.5 MG/3ML) 0.083% nebulizer solution, Take 3 mLs (2.5 mg total) by nebulization in the morning, at noon, in the evening, and at bedtime., Disp: 360 mL, Rfl: 3   ALPHAGAN P 0.1 % SOLN, Apply 1 drop to eye 3 (three) times daily., Disp: , Rfl:    benzonatate  (TESSALON ) 200 MG capsule, Take 200 mg by mouth 3 (three) times  daily., Disp: , Rfl:    dexlansoprazole  (DEXILANT ) 60 MG capsule, Take 1 capsule (60 mg total) by mouth daily., Disp: 90 capsule, Rfl: 3   dicyclomine  (BENTYL ) 10 MG capsule, Take 1 capsule (10 mg total) by mouth every 8 (eight) hours  as needed for spasms. (Patient taking differently: Take 10 mg by mouth 4 (four) times daily as needed for spasms. And at bedtime as needed), Disp: 60 capsule, Rfl: 1   docusate sodium  (COLACE) 100 MG capsule, Take 100 mg by mouth 2 (two) times daily., Disp: , Rfl:    EPINEPHrine  0.3 mg/0.3 mL IJ SOAJ injection, Inject 0.3 mg into the muscle as needed for anaphylaxis., Disp: , Rfl:    ergocalciferol  (VITAMIN D2) 1.25 MG (50000 UT) capsule, Take 50,000 Units by mouth once a week. Fridays, Disp: , Rfl:    famotidine  (PEPCID ) 20 MG tablet, One after supper, Disp: 30 tablet, Rfl: 11   FARXIGA  5 MG TABS tablet, Take 5 mg by mouth daily., Disp: , Rfl:    fluticasone  (FLONASE ) 50 MCG/ACT nasal spray, ADMINISTER 2 SPRAYS INTO EACH NOSTRIL 2 TIMES A DAY., Disp: 48 mL, Rfl: 1   LINZESS  290 MCG CAPS capsule, TAKE 1 CAPSULE BY MOUTH EVERY DAY BEFORE BREAKFAST, Disp: 30 capsule, Rfl: 3   montelukast  (SINGULAIR ) 10 MG tablet, TAKE 1 TABLET(10 MG) BY MOUTH AT BEDTIME, Disp: 30 tablet, Rfl: 1   potassium chloride  SA (KLOR-CON  M) 20 MEQ tablet, Take 1 tablet (20 mEq total) by mouth 2 (two) times daily., Disp: 60 tablet, Rfl: 1   Respiratory Therapy Supplies (FLUTTER) DEVI, 1 application by Does not apply route as directed., Disp: 1 each, Rfl: 0   rosuvastatin  (CRESTOR ) 10 MG tablet, Take 5 mg by mouth daily., Disp: , Rfl:    simethicone  (MYLICON) 125 MG chewable tablet, Chew 125 mg by mouth every 6 (six) hours as needed for flatulence., Disp: , Rfl:    spironolactone  (ALDACTONE ) 25 MG tablet, Take 1 tablet (25 mg total) by mouth daily., Disp: , Rfl:    topiramate  (TOPAMAX ) 25 MG tablet, Take 25 mg by mouth 2 (two) times daily., Disp: , Rfl:    traMADol  (ULTRAM ) 50 MG tablet, Take 1  tablet (50 mg total) by mouth every 6 (six) hours as needed., Disp: 15 tablet, Rfl: 0   ASSESSMENT AND PLAN: .      ICD-10-CM   1. Neurocardiogenic syncope  R55 EKG 12-Lead    Basic Metabolic Panel (BMET)    2. Primary hypertension  I10 Basic Metabolic Panel (BMET)    spironolactone  (ALDACTONE ) 25 MG tablet    3. Obstructive sleep apnea  G47.33 Basic Metabolic Panel (BMET)    4. Hypokalemia  E87.6 spironolactone  (ALDACTONE ) 25 MG tablet      1. Neurocardiogenic syncope Patient's presentation in September 2024 with syncope during her mother's funeral appears to be vasovagal syncope and not cardiogenic syncope.  Also her potassium was very low and she was dehydrated as well.  2. Primary hypertension Since weight loss her blood pressure has been very soft, presently she has reduced the dose of the carvedilol  from 12.5 mg twice daily to 6.25 mg daily, we will discontinue this as her blood pressure is 110/82 mmHg today and she continues to lose weight.  Will check BMP today to evaluate her potassium levels and also renal function, patient is presently on 50 mg of spironolactone  along with potassium 20 mEq twice daily.  She may not need the potassium supplements anymore.  3. Hypovitaminosis D She has had severe vitamin D  deficiency, presently on vitamin D  supplements, follow-up with her PCP for checking her levels of vitamin D   Okay to see her back in 6 months to further monitor her blood pressure, if she continues to lose weight  I suspect she may not tolerate spironolactone .  No clinical evidence of heart failure.  She remains stable from cardiac standpoint.  She has had negative nuclear stress test in 2019 and echocardiogram in 21 revealing normal LVEF with moderate LVH.  Addendum: BMP reviewed, serum creatinine is slightly worsened, will request patient to reduce spironolactone  to 25 mg daily, she will continue with potassium 20 mEq daily due to borderline low potassium levels especially  in view of decreasing spironolactone  from 50 to 25 mg daily.  Otherwise blood pressure is well-controlled.  Signed,  Gordy Bergamo, MD, [facility] 02/26/2023, 8:53 PM [facility] [address] [place], [place] [address] Phone: [phone]. Fax:  [phone]

## 2023-02-25 NOTE — Telephone Encounter (Signed)
 Patient called and stated that never received her instruction for her procedure and no one has called her to go over the procedure with her. Patient is also wanting to know that exactly location of where she would need to go for her hospital procedure. Patient procedure is feb 12. Please advise.

## 2023-02-25 NOTE — Telephone Encounter (Signed)
Spoke with the patient. She did get instructions in her visit. She is not certain where they are. She knows where to go and what time to be there. I have send a new copy to her through the My Chart.

## 2023-02-26 ENCOUNTER — Ambulatory Visit: Payer: 59 | Attending: Cardiology | Admitting: Cardiology

## 2023-02-26 ENCOUNTER — Encounter: Payer: Self-pay | Admitting: Cardiology

## 2023-02-26 VITALS — BP 110/82 | HR 90 | Resp 16 | Ht 63.0 in | Wt 230.0 lb

## 2023-02-26 DIAGNOSIS — E876 Hypokalemia: Secondary | ICD-10-CM | POA: Diagnosis not present

## 2023-02-26 DIAGNOSIS — R55 Syncope and collapse: Secondary | ICD-10-CM | POA: Diagnosis not present

## 2023-02-26 DIAGNOSIS — I10 Essential (primary) hypertension: Secondary | ICD-10-CM

## 2023-02-26 DIAGNOSIS — G4733 Obstructive sleep apnea (adult) (pediatric): Secondary | ICD-10-CM

## 2023-02-26 LAB — BASIC METABOLIC PANEL
BUN/Creatinine Ratio: 20 (ref 12–28)
BUN: 21 mg/dL (ref 8–27)
CO2: 24 mmol/L (ref 20–29)
Calcium: 9.7 mg/dL (ref 8.7–10.3)
Chloride: 103 mmol/L (ref 96–106)
Creatinine, Ser: 1.05 mg/dL — ABNORMAL HIGH (ref 0.57–1.00)
Glucose: 111 mg/dL — ABNORMAL HIGH (ref 70–99)
Potassium: 3.5 mmol/L (ref 3.5–5.2)
Sodium: 145 mmol/L — ABNORMAL HIGH (ref 134–144)
eGFR: 59 mL/min/{1.73_m2} — ABNORMAL LOW (ref >59)

## 2023-02-26 MED ORDER — SPIRONOLACTONE 25 MG PO TABS: 25.0000 mg | ORAL_TABLET | Freq: Every day | ORAL | Status: DC

## 2023-02-26 NOTE — Progress Notes (Signed)
 Addendum: BMP reviewed, serum creatinine is slightly worsened, will request patient to reduce spironolactone  to 25 mg daily, she will continue with potassium 20 mEq daily due to borderline low potassium levels especially in view of decreasing spironolactone  from 50 to 25 mg daily.  Otherwise blood pressure is well-controlled.  You can break the medication in half for now or ask us  for a new Rx for 25 mg. Because of your weight loss you have been requiring less BP medications  My Chart Message sent

## 2023-02-26 NOTE — Patient Instructions (Signed)
 Medication Instructions:  Your physician has recommended you make the following change in your medication: Stop Carvedilol   *If you need a refill on your cardiac medications before your next appointment, please call your pharmacy*   Lab Work: Have lab work done at [facility] on the first floor today--BMP If you have labs (blood work) drawn today and your tests are completely normal, you will receive your results only by: MyChart Message (if you have MyChart) OR A paper copy in the mail If you have any lab test that is abnormal or we need to change your treatment, we will call you to review the results.   Testing/Procedures: none   Follow-Up: At [facility], you and your health needs are our priority.  As part of our continuing mission to provide you with exceptional heart care, we have created designated Provider Care Teams.  These Care Teams include your primary Cardiologist (physician) and Advanced Practice Providers (APPs -  Physician Assistants and Nurse Practitioners) who all work together to provide you with the care you need, when you need it.  We recommend signing up for the patient portal called MyChart.  Sign up information is provided on this After Visit Summary.  MyChart is used to connect with patients for Virtual Visits (Telemedicine).  Patients are able to view lab/test results, encounter notes, upcoming appointments, etc.  Non-urgent messages can be sent to your provider as well.   To learn more about what you can do with MyChart, go to [email or web address].    Your next appointment:   6 month(s)  Provider:   Gordy Bergamo, MD     Other Instructions

## 2023-02-27 MED ORDER — SPIRONOLACTONE 25 MG PO TABS: 25.0000 mg | ORAL_TABLET | Freq: Every day | ORAL | 3 refills | Status: DC

## 2023-02-27 NOTE — Telephone Encounter (Addendum)
 The patient has been notified of the result and verbalized understanding.  All questions (if any) were answered. Alizza Sacra Chauvigne, RN 02/27/2023 3:33 PM  New prescription has been sent in.    ----- Message from Gordy Bergamo sent at 02/26/2023  8:55 PM EST ----- Addendum: BMP reviewed, serum creatinine is slightly worsened, will request patient to reduce spironolactone  to 25 mg daily, she will continue with potassium 20 mEq daily due to borderline low potassium levels especially in view of decreasing spironolactone  from 50 to 25 mg daily.  Otherwise blood pressure is well-controlled.  You can break the medication in half for now or ask us  for a new Rx for 25 mg. Because of your weight loss you have been requiring less BP medications  My Chart Message sent

## 2023-03-01 DIAGNOSIS — G4739 Other sleep apnea: Secondary | ICD-10-CM

## 2023-03-05 ENCOUNTER — Encounter (HOSPITAL_COMMUNITY)
Admission: RE | Disposition: A | Discharge status: Home / Self Care | Payer: Self-pay | Source: Home / Self Care | Attending: Gastroenterology

## 2023-03-05 ENCOUNTER — Ambulatory Visit (HOSPITAL_COMMUNITY)
Admission: RE | Admit: 2023-03-05 | Discharge: 2023-03-05 | Disposition: A | Discharge status: Home / Self Care | Payer: 59 | Attending: Gastroenterology | Admitting: Gastroenterology

## 2023-03-05 DIAGNOSIS — R053 Chronic cough: Secondary | ICD-10-CM | POA: Diagnosis not present

## 2023-03-05 DIAGNOSIS — R0989 Other specified symptoms and signs involving the circulatory and respiratory systems: Secondary | ICD-10-CM | POA: Diagnosis not present

## 2023-03-05 DIAGNOSIS — K224 Dyskinesia of esophagus: Secondary | ICD-10-CM

## 2023-03-05 DIAGNOSIS — K219 Gastro-esophageal reflux disease without esophagitis: Secondary | ICD-10-CM

## 2023-03-05 HISTORY — PX: 24 HOUR PH STUDY: SHX5419

## 2023-03-05 HISTORY — PX: ESOPHAGEAL MANOMETRY: SHX5429

## 2023-03-05 SURGERY — MANOMETRY, ESOPHAGUS

## 2023-03-05 MED ORDER — LIDOCAINE VISCOUS HCL 2 % MT SOLN
OROMUCOSAL | Status: AC
  Filled 2023-03-05: qty 15

## 2023-03-05 SURGICAL SUPPLY — 2 items
FACESHIELD LNG OPTICON STERILE (SAFETY) IMPLANT
GLOVE BIO SURGEON STRL SZ8 (GLOVE) ×2 IMPLANT

## 2023-03-05 NOTE — Progress Notes (Signed)
Esophageal Manometry done per protocol. Pt tolerated well with out complication. Ph with impedance done per protocol. Pt tolerated well. Instructions given regarding the study and monitor. Pt verbalized understand and return demonstrated use of monitor. Pt will return tomorrow to have probe removed and monitor downloaded.

## 2023-03-07 ENCOUNTER — Encounter (HOSPITAL_COMMUNITY): Payer: Self-pay | Admitting: Gastroenterology

## 2023-03-10 ENCOUNTER — Telehealth: Payer: Self-pay | Admitting: Nurse Practitioner

## 2023-03-10 DIAGNOSIS — G4733 Obstructive sleep apnea (adult) (pediatric): Secondary | ICD-10-CM

## 2023-03-10 NOTE — Telephone Encounter (Signed)
Patient would like results of sleep study. Patient phone number is [phone].

## 2023-03-11 NOTE — Telephone Encounter (Signed)
Did well on CPAP 11 cmH2O. Did not need BiPAP therapy, which she had trouble with previously. Please place orders for CPAP 11 cmH2O, Small size [employer] Full Face Simplus mask, heated humidity   Needs f/u in 8-12 weeks. Thanks

## 2023-03-21 NOTE — Telephone Encounter (Signed)
 Patient checking on message for sleep test results. Patient phone number is [phone].

## 2023-03-21 NOTE — Telephone Encounter (Signed)
 Pt notified and order placed

## 2023-03-24 ENCOUNTER — Telehealth: Payer: Self-pay | Admitting: Gastroenterology

## 2023-03-24 NOTE — Telephone Encounter (Signed)
 Inbound call from patient requesting a call to discuss 2/12 procedure results. Please advise, thank you.

## 2023-03-25 NOTE — Telephone Encounter (Signed)
 Patient advised that her results are not available yet.

## 2023-03-25 NOTE — Telephone Encounter (Signed)
 Patient had esophageal manometry 03/05/23. No results in chart yet.

## 2023-03-26 ENCOUNTER — Telehealth: Payer: Self-pay | Admitting: Cardiology

## 2023-03-26 MED ORDER — POTASSIUM CHLORIDE CRYS ER 20 MEQ PO TBCR: 20.0000 meq | EXTENDED_RELEASE_TABLET | Freq: Two times a day (BID) | ORAL | 3 refills | Status: DC

## 2023-03-26 NOTE — Telephone Encounter (Signed)
 Pt's medication was sent to pt's pharmacy as requested. Confirmation received.

## 2023-03-26 NOTE — Telephone Encounter (Signed)
*  STAT* If patient is at the pharmacy, call can be transferred to refill team.   1. Which medications need to be refilled? (please list name of each medication and dose if known)  potassium chloride SA (KLOR-CON M) 20 MEQ tablet  2. Which pharmacy/location (including street and city if local pharmacy) is medication to be sent to? [id number] [place], [place] - [address] Phone: [phone]  Fax: [phone]   3. Do they need a 30 day or 90 day supply? 90

## 2023-04-01 ENCOUNTER — Encounter: Payer: Self-pay | Admitting: Primary Care

## 2023-04-01 ENCOUNTER — Ambulatory Visit (INDEPENDENT_AMBULATORY_CARE_PROVIDER_SITE_OTHER): Admitting: Primary Care

## 2023-04-01 VITALS — BP 141/84 | HR 68 | Ht 63.0 in | Wt 230.0 lb

## 2023-04-01 DIAGNOSIS — G4733 Obstructive sleep apnea (adult) (pediatric): Secondary | ICD-10-CM | POA: Diagnosis not present

## 2023-04-01 MED ORDER — AMOXICILLIN-POT CLAVULANATE 875-125 MG PO TABS: 1.0000 | ORAL_TABLET | Freq: Two times a day (BID) | ORAL | 0 refills | Status: DC

## 2023-04-01 NOTE — Patient Instructions (Addendum)
 -  OBSTRUCTIVE SLEEP APNEA: Obstructive sleep apnea is a condition where your breathing repeatedly stops and starts during sleep due to blocked airways. We will adjust your CPAP machine pressure to 9 cm H2O with the ramp feature to help you tolerate it better. If you tolerate this well, we may consider reducing it to 8 cm H2O. Please continue your daily and weekly cleaning routine for the CPAP humidifier water chamber.  For a CPAP humidifier water chamber, it's recommended to clean it daily by emptying the water and wiping it clean, and weekly with a thorough wash in warm, soapy water, followed by a vinegar soak and thorough rinsing.   -SINOBRONCHITIS: Sinobronchitis is an inflammation of the sinuses and bronchial tubes, often causing increased mucus production and chest tightness. We will start you on Augmentin, and you should continue taking montelukast, budesonide, and albuterol nebulizers as prescribed. Also, continue using Mucinex. If your symptoms persist, please report back to [place], and we may consider adding prednisone to your treatment.  Orders: Change CPAP pressure 9cm h20 with Ramp Does not need replacement machine currently   Rx: Augmentin 1 tab twice daily x 7 days   INSTRUCTIONS:  Please follow up if your symptoms persist or worsen. Continue with the prescribed medications and CPAP adjustments. If you experience any new or worsening symptoms, contact our office immediately.  Follow-up  3 months with Waynetta Sandy NP

## 2023-04-01 NOTE — Telephone Encounter (Signed)
*  STAT* If patient is at the pharmacy, call can be transferred to refill team.   1. Which medications need to be refilled? (please list name of each medication and dose if known)   potassium chloride SA (KLOR-CON M) 20 MEQ tablet   2. Would you like to learn more about the convenience, safety, & potential cost savings by using the [facility]?   3. Are you open to using the [facility] (Type [facility]. ).  4. Which pharmacy/location (including street and city if local pharmacy) is medication to be sent to?  [id number] - [place], [place] - [address]   5. Do they need a 30 day or 90 day supply?  90 day  Patient stated her pharmacy only gave her 10 tablets.  Patient wants refill prescription re-sent to [id number] - [place], [place] - [address].

## 2023-04-01 NOTE — Progress Notes (Signed)
 @Patient  ID: Junius Finner, female    DOB: 27-Dec-1960, 63 y.o.   MRN: [id number]  No chief complaint on file.   Referring provider: Verlon Au, MD  HPI: 63 year old female, never smoked. PMH significant for HTN, chronic diastolic heart failure, cough variant asthma, chronic sinusitis, mixed sleep apnea, GERD, dysphagia, CKD, hyperlipidemia, obesity.   04/01/2023 Discussed the use of AI scribe software for clinical note transcription with the patient, who gave verbal consent to proceed.  History of Present Illness   The patient, with obstructive sleep apnea, presents with sleep apnea management concerns.  She previously underwent a titration study which indicated that a pressure of eleven was sufficient to eliminate apneas. Her current CPAP machine is set between 9-13cm h20 and she has difficulty tolerating the pressure, particularly at 11cm h20, due to mucus production in her throat. During the titration study, she experienced zero apneas at pressures of seven, eight, and nine, with increasing events at higher pressures.  She has ongoing issues with mucus production, which she attributes to a respiratory infection treated in [place]. Recently, there has been an increase in mucus production, accompanied by chest pain for the past four days. Her blood pressure is typically 116/70. She is currently taking Singulair (montelukast), budesonide, and albuterol via nebulizer, with the latter two being used every four hours, although she typically uses them twice daily. She also uses a flutter valve and takes Mucinex and benzonatate for congestion. The mucus is described as thick, with a change in its consistency, along with chest tightness for the past five days.  Her CPAP machine is still functional and does not require replacement. She follows a cleaning regimen for the CPAP humidifier water chamber, which includes daily emptying and weekly thorough washing with warm soapy water and a  vinegar soak.      [facility] compliance report 03/01/23-03/30/23 Usage days 7/30 days (23%) Average usage days used 3 hours 48 mins Pressure 13/9cm h20 Airleaks 18.9L/min (95%) AHI 3.2  Allergies  Allergen Reactions   Sulfonamide Derivatives Anaphylaxis, Swelling, Rash and Other (See Comments)    Swelling tongue and ear    Doxycycline Nausea And Vomiting   Latex Hives   Ciprofloxacin Rash   Fish Allergy Rash and Other (See Comments)    Only reaction to Mackerel.  No other issues with any type of fish or shellfish currently    Hydrocortisone Rash   Neomycin Rash    Immunization History  Administered Date(s) Administered   Influenza Split 11/21/2013, 11/21/2015, 10/21/2016   Influenza,inj,Quad PF,6+ Mos 02/23/2015, 11/21/2015, 11/11/2018   Influenza-Unspecified 11/25/2013, 02/23/2015, 10/21/2016, 11/11/2018   Moderna Covid-19 Fall Seasonal Vaccine 63yrs & older 12/19/2021   PFIZER(Purple Top)SARS-COV-2 Vaccination 09/28/2019, 11/03/2019, 05/17/2020   Pfizer Covid-19 Vaccine Bivalent Booster 39yrs & up 12/06/2020   Pneumococcal Polysaccharide-23 10/30/2005, 11/23/2015   Td 08/30/2005    Past Medical History:  Diagnosis Date   Acute on chronic diastolic CHF (congestive heart failure) (HCC) 04/11/2014   Acute suppurative otitis media without spontaneous rupture of eardrum 02/04/2007   Centricity Description: OTITIS MEDIA, SUPPURATIVE, ACUTE, BILATERAL Qualifier: Diagnosis of  By: Everardo All MD, Cleophas Dunker  Centricity Description: OTITIS MEDIA, SUPPURATIVE, ACUTE Qualifier: Diagnosis of  By: Everardo All MD, Sean A    Allergic rhinitis    Allergy    Anemia    Anxiety    Arthritis    Atherosclerosis of aorta (HCC) 02/21/2021   CAD (coronary artery disease)    LHC (11/29/1999):  EF 60%;  no significant CAD.   Carpal tunnel syndrome    Carpal tunnel syndrome 06/09/2006   Qualifier: Diagnosis of  By: Charlsie Quest RMA, Lucy     Chronic diastolic CHF (congestive heart failure) (HCC)    a. Echo (01/21/11):  Vigorous LVF, EF 65-70%, no RWMA, Gr 2 DD. b.  Echo (12/15): Moderate LVH, EF 60-65%, grade 2 diastolic dysfunction, mild LAE, normal RV function c. 01/2015: echo showing EF of 60-65% with mild LVH   Common migraine    COPD (chronic obstructive pulmonary disease) (HCC)    COVID-19    x 2   Diastolic heart failure (HCC)    Diverticulitis    Dyspnea    Hepatic steatosis    Hepatomegaly    Hiatal hernia    Hyperlipidemia    IBS (irritable bowel syndrome)    Internal hemorrhoids    Long COVID    Metabolic syndrome    Morbid obesity (HCC)    PNEUMONIA, ORGAN UNSPECIFIED 03/01/2009   Qualifier: Diagnosis of  By: Yancey Flemings CMA, Jennifer     Sinusitis, chronic 03/10/2012   CT sinuses 02/2012:  Chronic sinusitis with small A/F levels >> rx by ENT with prolonged augmentin F/u ct sinuses 04/2012:  Persistent sinus thickening >> rx with levaquin and clinda for 30 days.  Told to return for sinus scan in 6 weeks >> no showed for followup visit.  CT sinuses 11/2013:  Acute and chronic sinusitis noted.      Tobacco History: Social History   Tobacco Use  Smoking Status Never  Smokeless Tobacco Never   Counseling given: Not Answered   Outpatient Medications Prior to Visit  Medication Sig Dispense Refill   acetaminophen (TYLENOL) 650 MG CR tablet Take 650 mg by mouth every 8 (eight) hours as needed for pain (headache).     acetaminophen-codeine (TYLENOL #3) 300-30 MG tablet Take 1 tablet by mouth every 4 (four) hours as needed.     albuterol (PROVENTIL) (2.5 MG/3ML) 0.083% nebulizer solution Take 3 mLs (2.5 mg total) by nebulization in the morning, at noon, in the evening, and at bedtime. 360 mL 3   ALPHAGAN P 0.1 % SOLN Apply 1 drop to eye 3 (three) times daily.     benzonatate (TESSALON) 200 MG capsule Take 200 mg by mouth 3 (three) times daily.     dexlansoprazole (DEXILANT) 60 MG capsule Take 1 capsule (60 mg total) by mouth daily. 90 capsule 3   dicyclomine (BENTYL) 10 MG capsule Take 1  capsule (10 mg total) by mouth every 8 (eight) hours as needed for spasms. (Patient taking differently: Take 10 mg by mouth 4 (four) times daily as needed for spasms. And at bedtime as needed) 60 capsule 1   docusate sodium (COLACE) 100 MG capsule Take 100 mg by mouth 2 (two) times daily.     EPINEPHrine 0.3 mg/0.3 mL IJ SOAJ injection Inject 0.3 mg into the muscle as needed for anaphylaxis.     ergocalciferol (VITAMIN D2) 1.25 MG (50000 UT) capsule Take 50,000 Units by mouth once a week. Fridays     famotidine (PEPCID) 20 MG tablet One after supper 30 tablet 11   FARXIGA 5 MG TABS tablet Take 5 mg by mouth daily.     fluticasone (FLONASE) 50 MCG/ACT nasal spray ADMINISTER 2 SPRAYS INTO EACH NOSTRIL 2 TIMES A DAY. 48 mL 1   LINZESS 290 MCG CAPS capsule TAKE 1 CAPSULE BY MOUTH EVERY DAY BEFORE BREAKFAST 30 capsule 3   montelukast (SINGULAIR) 10 MG  tablet TAKE 1 TABLET(10 MG) BY MOUTH AT BEDTIME 30 tablet 1   potassium chloride SA (KLOR-CON M) 20 MEQ tablet Take 1 tablet (20 mEq total) by mouth 2 (two) times daily. 180 tablet 3   Respiratory Therapy Supplies (FLUTTER) DEVI 1 application by Does not apply route as directed. 1 each 0   rosuvastatin (CRESTOR) 10 MG tablet Take 5 mg by mouth daily.     simethicone (MYLICON) 125 MG chewable tablet Chew 125 mg by mouth every 6 (six) hours as needed for flatulence.     spironolactone (ALDACTONE) 25 MG tablet Take 1 tablet (25 mg total) by mouth daily. 90 tablet 3   topiramate (TOPAMAX) 25 MG tablet Take 25 mg by mouth 2 (two) times daily.     traMADol (ULTRAM) 50 MG tablet Take 1 tablet (50 mg total) by mouth every 6 (six) hours as needed. 15 tablet 0   No facility-administered medications prior to visit.   Review of Systems  Review of Systems  Constitutional: Negative.   HENT:  Positive for congestion.   Respiratory:  Positive for cough.      Physical Exam  There were no vitals taken for this visit. Physical Exam Constitutional:       Appearance: Normal appearance.  HENT:     Head: Normocephalic and atraumatic.  Cardiovascular:     Rate and Rhythm: Normal rate and regular rhythm.  Pulmonary:     Effort: Pulmonary effort is normal.     Breath sounds: Normal breath sounds.  Neurological:     General: No focal deficit present.     Mental Status: She is alert and oriented to person, place, and time. Mental status is at baseline.  Psychiatric:        Mood and Affect: Mood normal.        Behavior: Behavior normal.        Thought Content: Thought content normal.        Judgment: Judgment normal.      Lab Results:  CBC    Component Value Date/Time   WBC 8.4 10/11/2022 1030   RBC 3.84 (L) 10/11/2022 1030   HGB 11.8 (L) 10/11/2022 1030   HCT 36.7 10/11/2022 1030   PLT 221 10/11/2022 1030   MCV 95.6 10/11/2022 1030   MCH 30.7 10/11/2022 1030   MCHC 32.2 10/11/2022 1030   RDW 13.2 10/11/2022 1030   LYMPHSABS 3.5 05/20/2022 1514   MONOABS 0.8 05/20/2022 1514   EOSABS 0.5 05/20/2022 1514   BASOSABS 0.0 05/20/2022 1514    BMET    Component Value Date/Time   NA 145 (H) 02/26/2023 0920   K 3.5 02/26/2023 0920   CL 103 02/26/2023 0920   CO2 24 02/26/2023 0920   GLUCOSE 111 (H) 02/26/2023 0920   GLUCOSE 110 (H) 10/11/2022 1030   BUN 21 02/26/2023 0920   CREATININE 1.05 (H) 02/26/2023 0920   CREATININE 0.81 08/25/2015 1050   CALCIUM 9.7 02/26/2023 0920   GFRNONAA >60 10/11/2022 1030   GFRAA 56 (L) 12/01/2019 1637    BNP    Component Value Date/Time   BNP 32.9 10/24/2021 1430    ProBNP    Component Value Date/Time   PROBNP 35.0 05/20/2022 1514    Imaging: Sleep Study Documents Result Date: 03/11/2023 Ordered by an unspecified provider.    Assessment & Plan:   1. Obstructive sleep apnea (Primary) - Ambulatory Referral for DME   Assessment and Plan    Obstructive Sleep Apnea Patient is currently  on BIPAP 13/9cm h20 but reports difficulty tolerating higher pressure settings to due cough  and mucus production. CPAP titration study showed 11 cm H2O eliminates apneas, but she reports intolerance. Apneas appear to have been controlled during titration study with pressure between 7-9 cm H2O with zero events per hour. CPAP machine functioning well. She does not need BIPAP, switch to CPAP settings. Trial 9cm h20 with ramp feature. Instruct on daily and weekly cleaning of CPAP humidifier water chamber. Fu in 3 months.   Sinobronchitis Increased mucus, chest tightness, and pain suggest sinobronchitis. No wheezing. Responded well to Augmentin previously. - Prescribe Augmentin. - Continue montelukast, budesonide, and albuterol nebulizers. - Continue Mucinex. - Advise to report if symptoms persist; consider prednisone if chest tightness continues.      Glenford Bayley, NP 04/01/2023

## 2023-04-02 NOTE — Telephone Encounter (Signed)
 Called pt's pharmacy and they have the Rx that we sent in on 03/26/23 with a 90 day supply and 3 refills. Pharmacist stated that they would get pt's medication ready for pt to pick up. I also called the pt to inform her of this as well. I advised the pt that if she has any other problems, questions or concerns, to give our office a call back. Pt verbalized understanding.

## 2023-04-07 ENCOUNTER — Telehealth: Payer: Self-pay | Admitting: Primary Care

## 2023-04-07 MED ORDER — PREDNISONE 10 MG PO TABS: ORAL_TABLET | ORAL | 0 refills | Status: DC

## 2023-04-07 MED ORDER — AMOXICILLIN-POT CLAVULANATE 875-125 MG PO TABS: 1.0000 | ORAL_TABLET | Freq: Two times a day (BID) | ORAL | 0 refills | Status: DC

## 2023-04-07 NOTE — Telephone Encounter (Signed)
 Patient needs prednisone and an extension of her antibiotics. She would like 2 weeks worth of antibiotics, last week she was given only one week worth at her appointment. Patient can be reached at  [phone]   [facility] [address]

## 2023-04-07 NOTE — Telephone Encounter (Signed)
 Inbound call from patient requesting to know if results are in. Please advise, thank you.

## 2023-04-07 NOTE — Telephone Encounter (Signed)
 She has weak esophageal peristalsis, unfortunately we dont have any easy fix for it. She has good acid suppression on current regimen. Please advise her to stay on current meds and follow antireflux measures. I will discuss further with her in office visit, please schedule next available appointment with me. Thank you

## 2023-04-07 NOTE — Telephone Encounter (Signed)
 I called and spoke with pt. Pt states her head is till hurting on the frontal and temporal area. Pt states there is sinus pressure there. Pt states she is still taking her breathing treatments, tessalon pearles, and mucinex but the sinus congestion is still there. It is white mucous. Going on for 1.5 weeks now. Pt states a little bit of blood comes out. Pt states the pressure is a big issue and is requesting more prednisone/ antibiotics to help with this. Please advise.

## 2023-04-07 NOTE — Telephone Encounter (Signed)
 I will extend Augmentin course and prednisone taper. She should be using saline nasal rinses twice a day and flonase or nasacort   If not better shoulder be referred to ENT

## 2023-04-07 NOTE — Telephone Encounter (Signed)
 Do you have a copy of her report? Nothing has been scanned yet.

## 2023-04-07 NOTE — Telephone Encounter (Signed)
 I called and spoke to pt. Pt informed of Beth's note. Pt verbalized understanding. Nfn

## 2023-04-08 ENCOUNTER — Telehealth: Payer: Self-pay | Admitting: Primary Care

## 2023-04-08 ENCOUNTER — Other Ambulatory Visit: Payer: Self-pay

## 2023-04-08 DIAGNOSIS — G4733 Obstructive sleep apnea (adult) (pediatric): Secondary | ICD-10-CM

## 2023-04-08 NOTE — Telephone Encounter (Signed)
 Called and discussed results in details. Follow up in office visit as scheduled. Thanks

## 2023-04-08 NOTE — Telephone Encounter (Signed)
 Patient states that prescription needs to be sent to [facility] for her CPAP machine.The one that was sent was incorrect. She stared that it needed to be done by the end of the day (currently 4:53pm)Please ask for Arlys John.

## 2023-04-08 NOTE — Telephone Encounter (Signed)
 Spoke with patient. She is asking if the doctor would call her at her convenience? She had a couple of things she wanted to "run by her" before the appointment.

## 2023-04-08 NOTE — Telephone Encounter (Signed)
 Patient called asked if you could give a her a call back when you can.

## 2023-04-08 NOTE — Telephone Encounter (Signed)
 Spoke with the patient. Appointment scheduled for 07/15/23 at 9:50 am. Patient aware of her results. She states, "no, I am not going to stand for that. I know what [facility] is."

## 2023-04-09 NOTE — Telephone Encounter (Signed)
 Arlys John from [facility] calling today on behalf of this PT. He needs clarification on this order. Is the PT being changed to cpap therapy from Bipap? She has been calling us and him. His # is [phone]

## 2023-04-11 NOTE — Telephone Encounter (Signed)
 I have placed the CPAP order using template, indicating that this is a switch from BIPAP

## 2023-04-11 NOTE — Telephone Encounter (Signed)
CPAP order placed

## 2023-04-11 NOTE — Telephone Encounter (Signed)
 I spoke with Arlys John from [facility].  They are updating the levels.  Pt may need to bring device in to change the levels due to change from bipap to cpap.  New order not needed.  Pt made aware.  Nothing further needed at this time.

## 2023-04-11 NOTE — Telephone Encounter (Signed)
  Glenford Bayley, NP NPI: [id number]  Patient Information  Patient Name [name], [name] Legal Sex Female DOB Jun 03, 1960 SSN [id number]  Order Information  Order Date/Time Release Date/Time Start Date/Time End Date/Time 04/01/23 02:25 PM None 04/01/2023 None  Order History Outpatient Date/Time Action Taken User Additional Information 04/01/23 1305 Pend Glenford Bayley, NP  04/01/23 1419 Pend Glenford Bayley, NP  04/01/23 1425 Sign Glenford Bayley, NP   Comments  Change CPAP pressure 9cm h20 Does not currently need replacement machine Small fisher & paykey full face simplus mask

## 2023-04-11 NOTE — Telephone Encounter (Signed)
 I called and spoke to pt. Pt informed of Beth's note and verbalized understanding. Pt was currently on hold with Christian from [facility]. NFN

## 2023-04-11 NOTE — Telephone Encounter (Signed)
 Patient was on BIPAP 13/9cm h20 but reported that she had difficulty tolerating higher pressure settings to due cough and mucus production.  She had a previous CPAP titration study that showed pressure settings of 11 cm H2O eliminated apneas. Apneas appear to have been controlled during titration study with pressure between 7-9 cm H2O with zero events per hour. CPAP machine functioning well.   She does not need BIPAP, switch to CPAP settings. Trial lower pressure settings of 9cm h20 with ramp feature  What's the issue? I want her to be on CPAP and that is what she is asking for correct?

## 2023-04-11 NOTE — Telephone Encounter (Signed)
 I see this order.  CPAP has not been ordered for this patient.  The pt has a BiPap.

## 2023-04-11 NOTE — Telephone Encounter (Signed)
 Arlys John states he spoke to [place] a while ago and stated he was promised by [place] that this would be taken care of. Arlys John stated the pt was making statements that [facility] was sending "Illegal" order's and that she was "demanding" to be spoken too and demanding for CPAP to be sent now. Arlys John stated that she has a BIPAP and the order was send for CPAP. I informed Arlys John that a CPAP set up order was sent on 03-21-23. Arlys John thinks we may still be waiting on insurance to approve this, since he has not seen or received the CPAP order. I informed Arlys John that I would call the pt and have her call the insurance company. Arlys John verbalized understanding.   I called and spoke to pt. I informed pt that I spoke to [facility] who stated she was on BIPAP, and an order was already sent to start CPAP which [facility] has not received yet. I informed pt that she could call her insurance company to see what their hold up is. Pt stated she will "absolutely not be doing anything."  Pt states she was told she is on CPAP by [facility].  Pt is going out of town Saturday and will be gone for 3 weeks. Pt states she is hoping we will "get it together" by the time she comes back. Please advise what should be done further.

## 2023-04-11 NOTE — Telephone Encounter (Signed)
 I do not see an order for a CPAP only supplies on 2/28 & then for CPAP settings order on 3/11.    Pt is currently on a BiPap & will need a new CPAP order placed per [facility].    Spoke with pt to make her aware this will not be taken care of until Monday.

## 2023-04-11 NOTE — Telephone Encounter (Signed)
 [facility] stated she is on BIPAP. Pt stated she is on CPAP. When I spoke to [facility], they said she was on BIPAP and our office sent in CPAP setting orders.

## 2023-04-11 NOTE — Telephone Encounter (Signed)
 I placed an order for her to be switched to CPAP, so that is what I can and [facility] should have received that Let patient know we will take care of it

## 2023-04-18 ENCOUNTER — Other Ambulatory Visit: Payer: Self-pay | Admitting: Gastroenterology

## 2023-06-04 ENCOUNTER — Other Ambulatory Visit: Payer: Self-pay | Admitting: Gastroenterology

## 2023-06-05 ENCOUNTER — Telehealth: Payer: Self-pay | Admitting: Gastroenterology

## 2023-06-05 ENCOUNTER — Other Ambulatory Visit: Payer: Self-pay | Admitting: Gastroenterology

## 2023-06-05 NOTE — Telephone Encounter (Signed)
 Patient is requesting a refill on her medication Linzess . Patient stated that she does not have anymore refill or pills to take and is needing that sent over to the pharmacy today. Please advise.

## 2023-06-05 NOTE — Telephone Encounter (Signed)
 Patient called and stated that she is unable to come for her appointment with Dr. Leonia Raman on June the 24 th. Patient stated that she had already advise Beth of this and is need her to call her back to get that rescheduled. Patient seemed very frustrated. Please advise.

## 2023-06-05 NOTE — Telephone Encounter (Signed)
 This refill was already sent by electronic refill request from pts pharmacy

## 2023-06-05 NOTE — Telephone Encounter (Signed)
 Spoke with the patient. Rescheduled her appointment to fit her needs. She is in physical therapy and will be charged a fee if she cancels per patient.

## 2023-07-03 ENCOUNTER — Ambulatory Visit: Admitting: Primary Care

## 2023-07-15 ENCOUNTER — Ambulatory Visit: Admitting: Gastroenterology

## 2023-07-28 ENCOUNTER — Telehealth: Payer: Self-pay | Admitting: Gastroenterology

## 2023-07-28 MED ORDER — LINACLOTIDE 290 MCG PO CAPS
290.0000 ug | ORAL_CAPSULE | Freq: Every day | ORAL | 2 refills | Status: AC
Start: 2023-07-28 — End: ?

## 2023-07-28 NOTE — Telephone Encounter (Signed)
 Patient stated that she needs her medication to be sent per information below. Patient stated that if she does not hear back from us  she will be calling back at 4pm today. Please advise.

## 2023-07-28 NOTE — Telephone Encounter (Signed)
 I spoke with [name]. She will be home in July. She is going to call back to r/s her appointment. No schedule in currently for Dr Shila office. I have refilled her Linzess  so she won't run out.

## 2023-07-28 NOTE — Telephone Encounter (Signed)
 Inbound call from patient requesting a refill for Linzess  [facility] [place]  [facility] [address]. States she has 11 capsules left and will not be back until the end of January. Please advise, thank you.

## 2023-08-06 ENCOUNTER — Ambulatory Visit: Admitting: Gastroenterology

## 2023-08-13 ENCOUNTER — Ambulatory Visit: Payer: Self-pay | Admitting: Primary Care

## 2023-08-13 NOTE — Telephone Encounter (Signed)
 FYI Only or Action Required?: FYI only for provider.  Patient is followed in Pulmonology for asthma and OSA, last seen on 04/01/2023 by Hope Almarie ORN, NP.  Called Nurse Triage reporting Hospitalization Follow-up, Wheezing, Sore Throat, Cough, chest congestion, excessive throat clearing, and Shortness of Breath.  Symptoms began several weeks ago.  Interventions attempted: OTC medications: benzonatate , vitamin C drops, sinus rinses, Prescription medications: prednisone , Nebulizer treatments, and Other: ED and UC visits.  Symptoms are: persisting with no improvement but no worsening.  Triage Disposition: See HCP Within 4 Hours (Or PCP Triage)  Patient/caregiver understands and will follow disposition?: No, refuses disposition       Copied from CRM #[id number]. Topic: Clinical - Red Word Triage >> Aug 13, 2023  9:08 AM Russell PARAS wrote: Red Word that prompted transfer to Nurse Triage:   Was seen in [place] ER on 07/04  Diagnosed w/acute bronchitis Was given 7 days of steroids Still having symptoms Clearing large amounts of mucus Wheezing Sore throat from frequent throat clearing Wet cough   Reason for Disposition  [1] MILD difficulty breathing (e.g., minimal/no SOB at rest, SOB with walking, pulse < 100) AND [2] NEW-onset or WORSE than normal  Answer Assessment - Initial Assessment Questions E2C2 Pulmonary Triage - Initial Assessment Questions Chief Complaint (e.g., cough, sob, wheezing, fever, chills, sweat or additional symptoms) *Go to specific symptom protocol after initial questions. Didn't improve at all with steroids Told me to make follow up appt with pulm doc No antibx given, didn't want to take risk with heart hx Treated me in ER with different types meds to get pulse down Talking in full sentences Nighttime lay down, coughing up mucus all night and during day White clear sputum Still coughing after nebulizer but able to get things up easier after nebulizer Daughter  says can hear me wheezing Just a little bit SOB High BP when went in to ED, then came back down, taking BP once a week per cardio 118/78 Tired No chest pain or dizziness Clearing throat like this is making throat sore SOB intermittently with moving around and while sitting, not currently feeling SOB  How long have symptoms been present? Several weeks  MEDICINES:   Have you used any OTC meds to help with symptoms? Yes If yes, ask What medications? Benzonatate  Sinus rinses Vitamin C drops/lozenges  Have you used your inhalers/maintenance medication? Yes If yes, What medications? Albuterol  and budesonide  together in nebulizer 2x/day, just a little relief  OXYGEN: Do you wear supplemental oxygen? No  Do you monitor your oxygen levels? Yes If yes, What is your reading (oxygen level) today? Over 90%, when sick 90-91%  What is your usual oxygen saturation reading?  (Note: Pulmonary O2 sats should be 90% or greater) 95-96%  6. CARDIAC HISTORY: Do you have any history of heart disease? (e.g., heart attack, angina, bypass surgery, angioplasty)      significant  7. LUNG HISTORY: Do you have any history of lung disease?  (e.g., pulmonary embolus, asthma, emphysema)     Significant  Asking for appt Friday, since traveling back to [facility] tomorrow PCP has no availability until September UC told me not to come back but go to pulm    Advised pt be examined in next 4 hours, advised UC in meantime, pt refusing and stating UC told her not to return, advised pt call back with any worsening or new symptoms, go to ED if chest pain or dizziness or oxygen levels truly 90% or lower, scheduled  earliest appt for pt/pulm availability on 7/25.  Protocols used: Breathing Difficulty-A-AH

## 2023-08-14 NOTE — Telephone Encounter (Signed)
 APPT made, nfn

## 2023-08-15 ENCOUNTER — Ambulatory Visit (INDEPENDENT_AMBULATORY_CARE_PROVIDER_SITE_OTHER): Admitting: Emergency Medicine

## 2023-08-15 ENCOUNTER — Encounter: Payer: Self-pay | Admitting: Emergency Medicine

## 2023-08-15 ENCOUNTER — Other Ambulatory Visit (HOSPITAL_COMMUNITY): Payer: Self-pay

## 2023-08-15 ENCOUNTER — Telehealth: Payer: Self-pay | Admitting: Cardiology

## 2023-08-15 ENCOUNTER — Telehealth: Payer: Self-pay

## 2023-08-15 VITALS — BP 143/84 | HR 60 | Ht 63.0 in | Wt 242.0 lb

## 2023-08-15 DIAGNOSIS — R0609 Other forms of dyspnea: Secondary | ICD-10-CM | POA: Diagnosis not present

## 2023-08-15 MED ORDER — FUROSEMIDE 40 MG PO TABS: 40.0000 mg | ORAL_TABLET | Freq: Every day | ORAL | 0 refills | Status: DC

## 2023-08-15 MED ORDER — PREDNISONE 10 MG PO TABS: ORAL_TABLET | ORAL | 0 refills | Status: AC

## 2023-08-15 MED ORDER — AZITHROMYCIN 250 MG PO TABS
ORAL_TABLET | ORAL | 0 refills | Status: DC
Start: 2023-08-15 — End: 2023-08-22

## 2023-08-15 MED ORDER — ALBUTEROL SULFATE (2.5 MG/3ML) 0.083% IN NEBU: 2.5000 mg | INHALATION_SOLUTION | Freq: Four times a day (QID) | RESPIRATORY_TRACT | 3 refills | Status: DC

## 2023-08-15 NOTE — Telephone Encounter (Signed)
  Patient states fluid intake is off and Dr Shelah, her pulmonologist put her on lasix  for 3 days. Dr Shelah wanted her to let Dr Ladona know in case Ladona thinks she should be on it longer than 3 days. She will need a new prescription if Ganji wants her to take it longer.

## 2023-08-15 NOTE — Patient Instructions (Addendum)
 Continue budesonide  and albuterol  nebulizer treatments twice a day Please take azithromycin  as directed until completely gone Take prednisone  as directed until completely gone Take Lasix  40 mg once daily for 3 days and then stop Stay on your usual spironolactone  Please continue your usual Pepcid  and Dexilant  as you have been taking them Please continue your usual fluticasone  nasal spray and Singulair  as you have been taking them. Okay to continue to use your Tessalon  Perles for cough suppression You need to follow-up with Dr. Ladona when possible in case there need to be adjustments made to your diuretic medications Follow-up with Dr. Darlean or APP in our office in about 1 month

## 2023-08-15 NOTE — Telephone Encounter (Signed)
 Should be seen in the clinic to determine if she is okay in 1-2 weeks

## 2023-08-15 NOTE — Progress Notes (Signed)
 Subjective:    Patient ID: [name], female    DOB: Jun 25, 1960, 63 y.o.   MRN: [id number]  HPI  Acute office visit 08/15/2023 --   63 year old woman who is a never smoker followed in our office for episodic cough, obstructive sleep apnea, questionable asthma, GERD, chronic rhinitis.  She also has a history of hypertension with diastolic dysfunction and chronic HFpEF.  She has had some difficulty tolerating her CPAP in the past, currently reports Today she presents after a URI in early July (in [place] ), did have a sick contact. She was treated with pred and tessalon . She continues to have cough w clear mucous, has L rib pain from cough. She has a globus sensation. She has about 15-20 lbs wt gain, is on spironolactone . She is using albuterol  about 2x a day, does help with chest tightness. She believes that her gerd is active. Also active PND on her usual allergy  meds.   Currently managed on Pepcid  and Dexilant , Flonase  and Singulair .  She is on budesonide  and albuterol  nebs bid. Using tessalon  on a schedule.    Review of Systems As per HPI  Past Medical History:  Diagnosis Date   Acute on chronic diastolic CHF (congestive heart failure) (HCC) 04/11/2014   Acute suppurative otitis media without spontaneous rupture of eardrum 02/04/2007   Centricity Description: OTITIS MEDIA, SUPPURATIVE, ACUTE, BILATERAL Qualifier: Diagnosis of  By: Kassie MD, Alyce LABOR  Centricity Description: OTITIS MEDIA, SUPPURATIVE, ACUTE Qualifier: Diagnosis of  By: Kassie MD, Sean A    Allergic rhinitis    Allergy     Anemia    Anxiety    Arthritis    Atherosclerosis of aorta (HCC) 02/21/2021   CAD (coronary artery disease)    LHC (11/29/1999):  EF 60%; no significant CAD.   Carpal tunnel syndrome    Carpal tunnel syndrome 06/09/2006   Qualifier: Diagnosis of  By: Wilhemina RMA, Lucy     Chronic diastolic CHF (congestive heart failure) (HCC)    a. Echo (01/21/11): Vigorous LVF, EF 65-70%, no RWMA, Gr 2 DD. b.   Echo (12/15): Moderate LVH, EF 60-65%, grade 2 diastolic dysfunction, mild LAE, normal RV function c. 01/2015: echo showing EF of 60-65% with mild LVH   Common migraine    COPD (chronic obstructive pulmonary disease) (HCC)    COVID-19    x 2   Diastolic heart failure (HCC)    Diverticulitis    Dyspnea    Hepatic steatosis    Hepatomegaly    Hiatal hernia    Hyperlipidemia    IBS (irritable bowel syndrome)    Internal hemorrhoids    Long COVID    Metabolic syndrome    Morbid obesity (HCC)    PNEUMONIA, ORGAN UNSPECIFIED 03/01/2009   Qualifier: Diagnosis of  By: Thalia CMA, Jennifer     Sinusitis, chronic 03/10/2012   CT sinuses 02/2012:  Chronic sinusitis with small A/F levels >> rx by ENT with prolonged augmentin  F/u ct sinuses 04/2012:  Persistent sinus thickening >> rx with levaquin  and clinda for 30 days.  Told to return for sinus scan in 6 weeks >> no showed for followup visit.  CT sinuses 11/2013:  Acute and chronic sinusitis noted.      Family History  Problem Relation Age of Onset   Heart attack Mother 75       1st one @ 65, 2nd one 24   Heart disease Mother    Hypertension Mother    Asthma Sister  Colon polyps Sister    Diabetes Sister    Diabetes Sister    Diabetes Brother    Lung cancer Brother    Diabetes Brother    Diabetes Brother    Diabetes Brother    Diabetes Half-Sister    Stroke Neg Hx    Colon cancer Neg Hx    Esophageal cancer Neg Hx    Rectal cancer Neg Hx    Stomach cancer Neg Hx    Pancreatic cancer Neg Hx      Social History   Socioeconomic History   Marital status: Widowed    Spouse name: Not on file   Number of children: 2   Years of education: Not on file   Highest education level: Not on file  Occupational History   Occupation: Retired  Tobacco Use   Smoking status: Never   Smokeless tobacco: Never  Vaping Use   Vaping status: Never Used  Substance and Sexual Activity   Alcohol use: No   Drug use: No   Sexual activity: Not  on file  Other Topics Concern   Not on file  Social History Narrative   Physicist, medical, Lawyer.   Widowed, lives with son.   Social Drivers of Health   Financial Resource Strain: Low Risk  (06/02/2020)   Received from [facility] visits prior to 03/23/2022.   Overall Financial Resource Strain (CARDIA)    Difficulty of Paying Living Expenses: Not hard at all  Food Insecurity: Low Risk  (10/08/2022)   Received from [facility]   Hunger Vital Sign    Within the past 12 months, you worried that your food would run out before you got money to buy more: Never true    Within the past 12 months, the food you bought just didn't last and you didn't have money to get more. : Never true  Transportation Needs: No Transportation Needs (10/08/2022)   Received from [facility]    In the past 12 months, has lack of reliable transportation kept you from medical appointments, meetings, work or from getting things needed for daily living? : No  Physical Activity: Inactive (06/02/2020)   Received from [facility] visits prior to 03/23/2022.   Exercise Vital Sign    On average, how many days per week do you engage in moderate to strenuous exercise (like a brisk walk)?: 0 days    On average, how many minutes do you engage in exercise at this level?: 0 min  Stress: Stress Concern Present (06/02/2020)   Received from [facility] visits prior to 03/23/2022.   [employer] of Occupational Health - Occupational Stress Questionnaire    Feeling of Stress : Very much  Social Connections: Moderately Integrated (06/02/2020)   Received from [facility] visits prior to 03/23/2022.   Social Connection and Isolation Panel    In a typical week, how many times do you talk on the phone with family, friends, or neighbors?: Three times a week    How often do you get together with friends or relatives?: Three  times a week    How often do you attend church or religious services?: More than 4 times per year    Do you belong to any clubs or organizations such as church groups, unions, fraternal or athletic groups, or school groups?: Yes    How often do you attend meetings of the clubs or organizations you  belong to?: More than 4 times per year    Are you married, widowed, divorced, separated, never married, or living with a partner?: Widowed  Intimate Partner Violence: Not At Risk (06/02/2020)   [facility] visits prior to 03/23/2022.   Humiliation, Afraid, Rape, and Kick questionnaire    Within the last year, have you been afraid of your partner or ex-partner?: No    Within the last year, have you been humiliated or emotionally abused in other ways by your partner or ex-partner?: No    Within the last year, have you been kicked, hit, slapped, or otherwise physically hurt by your partner or ex-partner?: No    Within the last year, have you been raped or forced to have any kind of sexual activity by your partner or ex-partner?: No    Allergies  Allergen Reactions   Sulfonamide Derivatives Anaphylaxis, Swelling, Rash and Other (See Comments)    Swelling tongue and ear    Doxycycline  Nausea And Vomiting   Latex Hives   Ciprofloxacin Rash   Fish Allergy  Rash and Other (See Comments)    Only reaction to Mackerel.  No other issues with any type of fish or shellfish currently    Hydrocortisone Rash   Neomycin Rash    Current Outpatient Medications on File Prior to Visit  Medication Sig Dispense Refill   acetaminophen  (TYLENOL ) 650 MG CR tablet Take 650 mg by mouth every 8 (eight) hours as needed for pain (headache).     ALPHAGAN P 0.1 % SOLN Apply 1 drop to eye 3 (three) times daily.     amoxicillin -clavulanate (AUGMENTIN ) 875-125 MG tablet Take 1 tablet by mouth 2 (two) times daily. 14 tablet 0   amoxicillin -clavulanate (AUGMENTIN ) 875-125 MG tablet Take 1 tablet by  mouth 2 (two) times daily. 14 tablet 0   benzonatate  (TESSALON ) 200 MG capsule Take 200 mg by mouth 3 (three) times daily.     dexlansoprazole  (DEXILANT ) 60 MG capsule TAKE 1 CAPSULE BY MOUTH EVERY DAY 90 capsule 3   dicyclomine  (BENTYL ) 10 MG capsule Take 1 capsule (10 mg total) by mouth every 8 (eight) hours as needed for spasms. (Patient taking differently: Take 10 mg by mouth 4 (four) times daily as needed for spasms. And at bedtime as needed) 60 capsule 1   docusate sodium  (COLACE) 100 MG capsule Take 100 mg by mouth 2 (two) times daily.     EPINEPHrine  0.3 mg/0.3 mL IJ SOAJ injection Inject 0.3 mg into the muscle as needed for anaphylaxis.     ergocalciferol  (VITAMIN D2) 1.25 MG (50000 UT) capsule Take 50,000 Units by mouth once a week. Fridays     famotidine  (PEPCID ) 20 MG tablet One after supper 30 tablet 11   FARXIGA  5 MG TABS tablet Take 5 mg by mouth daily.     fluticasone  (FLONASE ) 50 MCG/ACT nasal spray ADMINISTER 2 SPRAYS INTO EACH NOSTRIL 2 TIMES A DAY. 48 mL 1   linaclotide  (LINZESS ) 290 MCG CAPS capsule Take 1 capsule (290 mcg total) by mouth daily before breakfast. 30 capsule 2   montelukast  (SINGULAIR ) 10 MG tablet TAKE 1 TABLET(10 MG) BY MOUTH AT BEDTIME 30 tablet 1   potassium chloride  SA (KLOR-CON  M) 20 MEQ tablet Take 1 tablet (20 mEq total) by mouth 2 (two) times daily. 180 tablet 3   Respiratory Therapy Supplies (FLUTTER) DEVI 1 application by Does not apply route as directed. 1 each 0   rosuvastatin  (CRESTOR ) 10 MG tablet  Take 5 mg by mouth daily.     rosuvastatin  (CRESTOR ) 5 MG tablet Take 5 mg by mouth daily.     spironolactone  (ALDACTONE ) 25 MG tablet Take 1 tablet (25 mg total) by mouth daily. 90 tablet 3   topiramate  (TOPAMAX ) 25 MG tablet Take 25 mg by mouth 2 (two) times daily.     traMADol  (ULTRAM ) 50 MG tablet Take 1 tablet (50 mg total) by mouth every 6 (six) hours as needed. 15 tablet 0   No current facility-administered medications on file prior to visit.          Objective:   Physical Exam  Vitals:   08/15/23 0857  BP: (!) 143/84  Pulse: 60  SpO2: 98%  Weight: 242 lb (109.8 kg)  Height: 5' 3 (1.6 m)   Gen: Pleasant, obese woman in no distress,  normal affect  ENT: No lesions,  mouth clear,  oropharynx clear, no postnasal drip  Neck: No JVD, no stridor  Lungs: No use of accessory muscles, she coughs on a deep inspiration.  No wheezing  Cardiovascular: RRR, heart sounds normal, no murmur or gallops, trace peripheral edema  Musculoskeletal: No deformities, no cyanosis or clubbing  Neuro: alert, awake, non focal  Skin: Warm, no lesions or rash     Assessment & Plan:  DOE (dyspnea on exertion) Persistent cough, dyspnea, chest discomfort since she had a URI several weeks ago.  Unclear whether she really responded to prednisone  and the symptoms have persisted.  Some of her presentation does sound like acute bronchitis or her usual upper airway irritation syndrome, but given persistence and her acute weight gain question whether there may be some volume overload.  I will treat her for a possible bronchitic flare but also give her 3 days of Lasix .  If she benefits from this then she probably will need to review her current regimen with Dr. Ladona in cardiology.  Plan to continue to treat her potential contributors to upper airway irritation including her GERD and her rhinitis (both seem to be active)   Continue budesonide  and albuterol  nebulizer treatments twice a day Please take azithromycin  as directed until completely gone Take prednisone  as directed until completely gone Take Lasix  40 mg once daily for 3 days and then stop Stay on your usual spironolactone  Please continue your usual Pepcid  and Dexilant  as you have been taking them Please continue your usual fluticasone  nasal spray and Singulair  as you have been taking them. Okay to continue to use your Tessalon  Perles for cough suppression You need to follow-up with Dr. Ladona  when possible in case there need to be adjustments made to your diuretic medications Follow-up with Dr. Darlean or APP in our office in about 1 month  Time spent 30 minutes   Lamar Chris, MD, PhD 08/15/2023, 10:32 AM [facility] [phone] or if no answer before 7:00PM call [phone] For any issues after 7:00PM please call eLink [phone]

## 2023-08-15 NOTE — Telephone Encounter (Signed)
*  Pulm  Pharmacy Patient Advocate Encounter   Received notification from CoverMyMeds that prior authorization for Albuterol  Sulfate (2.5 MG/3ML)0.083% nebulizer solution  is required/requested.   Insurance verification completed.   The patient is insured through [facility] .   Per test claim: Medication is not eligible for pharmacy benefits and must be billed through medical insurance. As our team only handles pharmacy related prior auths, medical PA's must be submitted by the clinic. Thank you

## 2023-08-15 NOTE — Assessment & Plan Note (Signed)
 Persistent cough, dyspnea, chest discomfort since she had a URI several weeks ago.  Unclear whether she really responded to prednisone  and the symptoms have persisted.  Some of her presentation does sound like acute bronchitis or her usual upper airway irritation syndrome, but given persistence and her acute weight gain question whether there may be some volume overload.  I will treat her for a possible bronchitic flare but also give her 3 days of Lasix .  If she benefits from this then she probably will need to review her current regimen with Dr. Ladona in cardiology.  Plan to continue to treat her potential contributors to upper airway irritation including her GERD and her rhinitis (both seem to be active)   Continue budesonide  and albuterol  nebulizer treatments twice a day Please take azithromycin  as directed until completely gone Take prednisone  as directed until completely gone Take Lasix  40 mg once daily for 3 days and then stop Stay on your usual spironolactone  Please continue your usual Pepcid  and Dexilant  as you have been taking them Please continue your usual fluticasone  nasal spray and Singulair  as you have been taking them. Okay to continue to use your Tessalon  Perles for cough suppression You need to follow-up with Dr. Ladona when possible in case there need to be adjustments made to your diuretic medications Follow-up with Dr. Darlean or APP in our office in about 1 month

## 2023-08-18 NOTE — Telephone Encounter (Signed)
 Called patient back about message. Informed her of Dr. Godfrey response. Scheduled patient this Friday. Patient verbalized understanding.

## 2023-08-19 MED ORDER — ALBUTEROL SULFATE (2.5 MG/3ML) 0.083% IN NEBU: 2.5000 mg | INHALATION_SOLUTION | Freq: Four times a day (QID) | RESPIRATORY_TRACT | 3 refills | Status: DC

## 2023-08-19 NOTE — Addendum Note (Signed)
 Addended byBETHA FRIES, Pa Tennant A on: 08/19/2023 10:44 AM   Modules accepted: Orders

## 2023-08-19 NOTE — Telephone Encounter (Signed)
 Neb rx has been sent with diagnosis code.

## 2023-08-22 ENCOUNTER — Ambulatory Visit: Attending: Cardiology | Admitting: Cardiology

## 2023-08-22 ENCOUNTER — Encounter: Payer: Self-pay | Admitting: Cardiology

## 2023-08-22 VITALS — BP 133/80 | HR 85 | Resp 16 | Ht 63.0 in | Wt 237.2 lb

## 2023-08-22 DIAGNOSIS — E876 Hypokalemia: Secondary | ICD-10-CM | POA: Diagnosis not present

## 2023-08-22 DIAGNOSIS — I5032 Chronic diastolic (congestive) heart failure: Secondary | ICD-10-CM | POA: Diagnosis not present

## 2023-08-22 DIAGNOSIS — I10 Essential (primary) hypertension: Secondary | ICD-10-CM

## 2023-08-22 NOTE — Patient Instructions (Signed)
 Medication Instructions:  Your physician recommends that you continue on your current medications as directed. Please refer to the Current Medication list given to you today.  *If you need a refill on your cardiac medications before your next appointment, please call your pharmacy*  Lab Work: none If you have labs (blood work) drawn today and your tests are completely normal, you will receive your results only by: MyChart Message (if you have MyChart) OR A paper copy in the mail If you have any lab test that is abnormal or we need to change your treatment, we will call you to review the results.  Testing/Procedures: none  Follow-Up: At [facility], you and your health needs are our priority.  As part of our continuing mission to provide you with exceptional heart care, our providers are all part of one team.  This team includes your primary Cardiologist (physician) and Advanced Practice Providers or APPs (Physician Assistants and Nurse Practitioners) who all work together to provide you with the care you need, when you need it.  Your next appointment:   As needed  Provider:   Knox Perl, MD    We recommend signing up for the patient portal called "MyChart".  Sign up information is provided on this After Visit Summary.  MyChart is used to connect with patients for Virtual Visits (Telemedicine).  Patients are able to view lab/test results, encounter notes, upcoming appointments, etc.  Non-urgent messages can be sent to your provider as well.   To learn more about what you can do with MyChart, go to [email or web address].   Other Instructions

## 2023-08-22 NOTE — Progress Notes (Signed)
 Cardiology Office Note:  .   Date:  08/22/2023  ID:  [name], DOB [birth year], MRN [id number] PCP: Jolee Madelin Patch, MD  [facility] Providers Cardiologist:  Gordy Bergamo, MD   History of Present Illness: .   [name] is a 63 y.o. African-American female patient with chronic diastolic heart failure, normal coronary arteries by remote catheterization and also by coronary CTA in September 2022, primary hypertension, chronic hypokalemia, hyperglycemia, hyperlipidemia, morbid obesity, moderate to severe OSA on BiPAP, IBS and GERD,  presents for follow-up of heart failure, hypertension management, last seen 6 months ago.    She has been losing weight and her BMI has reduced from 50 to around 42 over time.  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  However as her renal function was back to baseline she is back on 50 mg of spironolactone  with marked improvement in Hypokalemia and now taking only 2 tablets of potassium supplements.  Due to acute exacerbation of heart failure off of furosemide , she was started on torsemide  by her nephrologist with marked improvement in dyspnea, weight gain and overall wellbeing.  She is essentially asymptomatic today.  Echocardiogram 03/23/2019: Hyperdynamic LVEF.  Myocardial perfusion scan 04/02/2017: Normal LVEF, no ischemia.  Discussed the use of AI scribe software for clinical note transcription with the patient, who gave verbal consent to proceed.  History of Present Illness [name] is a 63 year old female with chronic diastolic heart failure who presents for evaluation of fluid retention and medication management. She was referred by Dr. Cheryal for evaluation of fluid retention and medication management.  Fluid retention worsened after an upper respiratory  infection in early July, with subsequent steroid use causing weight gain. Sporanox has been ineffective in managing fluid retention. Torsemide  was reintroduced at 20 mg daily, resulting in increased urination and some relief. Spironolactone  was increased to 50 mg daily, reducing her potassium intake from nine to two pills daily.  Blood pressure at home is generally 120/68 to 120/70, with occasional increases during headaches. Corvell effectively reduces blood pressure from 130 to 115 or 118. She experiences labored breathing and occasional light headaches.  Labs   Lab Results  Component Value Date   CHOL 120 05/23/2020   HDL 49 05/23/2020   LDLCALC 54 05/23/2020   TRIG 89 05/23/2020   CHOLHDL 2.4 05/23/2020   No results found for: LIPOA  Lab Results  Component Value Date   NA 145 (H) 02/26/2023   K 3.5 02/26/2023   CO2 24 02/26/2023   GLUCOSE 111 (H) 02/26/2023   BUN 21 02/26/2023   CREATININE 1.05 (H) 02/26/2023   CALCIUM  9.7 02/26/2023   GFR 28.15 (L) 04/10/2021   EGFR 59 (L) 02/26/2023   GFRNONAA >60 10/11/2022      Latest Ref Rng & Units 02/26/2023    9:20 AM 10/11/2022   10:30 AM 05/15/2022    9:40 AM  BMP  Glucose 70 - 99 mg/dL 888  889  93   BUN 8 - 27 mg/dL 21  20  23    Creatinine 0.57 - 1.00 mg/dL 8.94  9.29  9.12   BUN/Creat Ratio 12 - 28 20     Sodium 134 - 144 mmol/L 145  139  137   Potassium 3.5 - 5.2 mmol/L 3.5  2.6  4.1  Chloride 96 - 106 mmol/L 103  101  107   CO2 20 - 29 mmol/L 24  27  22    Calcium  8.7 - 10.3 mg/dL 9.7  8.8  8.7       Latest Ref Rng & Units 10/11/2022   10:30 AM 05/20/2022    3:14 PM 05/15/2022    9:40 AM  CBC  WBC 4.0 - 10.5 K/uL 8.4  12.2  13.1   Hemoglobin 12.0 - 15.0 g/dL 88.1  87.2  86.8   Hematocrit 36.0 - 46.0 % 36.7  37.9  41.7   Platelets 150 - 400 K/uL 221  267.0  272    Lab Results  Component Value Date   HGBA1C 5.8 (H) 02/19/2015    Lab Results  Component Value Date   TSH 0.78 05/20/2022    External  Labs:   Care everywhere labs   Renal function panel 08/21/2023:  BUN 24, creatinine 0.92, EGFR 70 mL.  Urinary albumin to creatinine ratio <5.  05/02/2022: Potassium 4.0, BUN 15, creatinine 0.94.   10/08/2022: BUN 22, creatinine 0.85.  ROS  Review of Systems  Cardiovascular:  Negative for chest pain, dyspnea on exertion and leg swelling.   Physical Exam:   VS:  BP 133/80 (BP Location: Left Arm, Patient Position: Sitting, Cuff Size: Large)   Pulse 85   Resp 16   Ht 5' 3 (1.6 m)   Wt 237 lb 3.2 oz (107.6 kg)   SpO2 96%   BMI 42.02 kg/m    Wt Readings from Last 3 Encounters:  08/22/23 237 lb 3.2 oz (107.6 kg)  08/15/23 242 lb (109.8 kg)  04/01/23 230 lb (104.3 kg)    Physical Exam Constitutional:      Appearance: She is obese.  Neck:     Vascular: No carotid bruit or JVD.  Cardiovascular:     Rate and Rhythm: Normal rate and regular rhythm.     Pulses: Intact distal pulses.     Heart sounds: Normal heart sounds. No murmur heard.    No gallop.  Pulmonary:     Effort: Pulmonary effort is normal.     Breath sounds: Normal breath sounds.  Abdominal:     General: Bowel sounds are normal.     Palpations: Abdomen is soft.  Musculoskeletal:     Right lower leg: No edema.     Left lower leg: No edema.    Studies Reviewed: SABRA     EKG:    EKG Interpretation Date/Time:  Friday August 22 2023 14:47:47 EDT Ventricular Rate:  80 PR Interval:  176 QRS Duration:  78 QT Interval:  380 QTC Calculation: 438 R Axis:   -56  Text Interpretation: EKG 08/22/2023: Normal sinus rhythm at rate of 80 bpm, left anterior fascicular block.  Poor R progression, cannot exclude anteroseptal infarct old. Compared to 02/26/2023, leftward axis is more prominent.  Otherwise no change. Confirmed by Sol Englert, Jagadeesh ([id number]) on 08/22/2023 5:40:39 PM    Medications ordered    No orders of the defined types were placed in this encounter.    ASSESSMENT AND PLAN: .      ICD-10-CM   1. Primary  hypertension  I10     2. Chronic diastolic heart failure (HCC)  P49.67 EKG 12-Lead    3. Hypokalemia  E87.6      Assessment & Plan Chronic diastolic heart failure Chronic diastolic heart failure is well-managed with no clinical evidence of heart failure. Heart function is normal based on  echocardiogram from 2021. Recent steroid use for an upper respiratory infection may have contributed to fluid retention, but torsemide  has been effective in alleviating this. - Continue torsemide  20 mg once a day - Continue spironolactone  50 mg once a day - Continue Farxiga  as prescribed - Release management to nephrologist, Dr. Gearline - I reviewed the renal function that was done earlier yesterday at nephrology office, renal function has remained very stable.  Hypertension Hypertension is well-controlled with current medication regimen. Blood pressure readings at home are approximately 120/68 to 120/70 mmHg. Spironolactone  and torsemide  are contributing to blood pressure management. Carvedilol  is not needed as blood pressure is stable. - Continue spironolactone  50 mg once a day - Continue torsemide  20 mg once a day - Monitor blood pressure at home  Hypokalemia Hypokalemia is well-managed with current treatment. Potassium levels have improved to 3.9 mmol/L with the use of spironolactone , reducing the need for potassium supplements from nine pills to two pills daily. - Continue spironolactone  50 mg once a day - Continue current potassium supplementation of two pills daily   CC: Almarie Gearline, MD (Nephro)  Signed,  Gordy Bergamo, MD, [facility] 08/22/2023, 5:40 PM [facility] [address] [place], [place] [address] Phone: [phone]. Fax:  [phone]

## 2023-08-28 ENCOUNTER — Other Ambulatory Visit: Payer: Self-pay | Admitting: Gastroenterology

## 2023-08-28 ENCOUNTER — Other Ambulatory Visit: Payer: Self-pay | Admitting: Internal Medicine

## 2023-08-28 DIAGNOSIS — R0609 Other forms of dyspnea: Secondary | ICD-10-CM

## 2023-09-01 DIAGNOSIS — R0989 Other specified symptoms and signs involving the circulatory and respiratory systems: Secondary | ICD-10-CM

## 2023-09-03 ENCOUNTER — Telehealth: Payer: Self-pay | Admitting: Cardiology

## 2023-09-03 MED ORDER — POTASSIUM CHLORIDE CRYS ER 20 MEQ PO TBCR
20.0000 meq | EXTENDED_RELEASE_TABLET | Freq: Two times a day (BID) | ORAL | 3 refills | Status: AC
Start: 2023-09-03 — End: ?

## 2023-09-03 NOTE — Telephone Encounter (Signed)
*  STAT* If patient is at the pharmacy, call can be transferred to refill team.   1. Which medications need to be refilled? (please list name of each medication and dose if known) potassium chloride  SA (KLOR-CON  M) 20 MEQ tablet    2. Would you like to learn more about the convenience, safety, & potential cost savings by using the [facility]? No   3. Are you open to using the [facility] (Type [facility]. ) No   4. Which pharmacy/location (including street and city if local pharmacy) is medication to be sent to?  [id number] [place], [place] - [address]    5. Do they need a 30 day or 90 day supply? 90 day

## 2023-09-03 NOTE — Telephone Encounter (Signed)
 RX sent in

## 2023-10-16 ENCOUNTER — Ambulatory Visit: Admitting: Cardiology

## 2023-10-20 ENCOUNTER — Ambulatory Visit: Admitting: Internal Medicine

## 2023-10-22 ENCOUNTER — Other Ambulatory Visit: Payer: Self-pay | Admitting: Family Medicine

## 2023-10-22 DIAGNOSIS — Z1231 Encounter for screening mammogram for malignant neoplasm of breast: Secondary | ICD-10-CM

## 2023-11-04 ENCOUNTER — Telehealth: Payer: Self-pay | Admitting: Internal Medicine

## 2023-11-04 NOTE — Telephone Encounter (Signed)
 CRM from E2C2 documented on 09/30/23 but was not routed to the clinic. Pt called to reschedule her appt from Dr Darlean to Dr Shelah.  She said her heart dr advised her to do so.  She said she likes Dr Shelah and he relates to her.  I rescheduled her appt, b/c this pt was not going to take anything less than an appt w/ Dr Shelah.  Will submit to [facility] supervisors.

## 2023-11-04 NOTE — Telephone Encounter (Signed)
 Attempted to call patient back, but no answer and left VM.   Sent patient a Radio broadcast assistant as well. At this time we were able to adjust her visit on 11/05/2023 to accommodate a transfer of care appt, in which is 30 min vs the original timeframe of that was scheduled in error.  In VM and MyChart message to patient, I notified that with this adjustment her appt start time would be 10am and an arrival at 9:45am.

## 2023-11-04 NOTE — Telephone Encounter (Signed)
 Pt is on Dr Lanny schedule for 08/15/23. She was asked to f/u with Dr Darlean or APP in a month. I called her to reschedule her to Dr. Darlean or APP. She refused, stating that she was told by the person who scheduled her appt tomorrow that it was fine for her to switch providers. She refused to reschedule visit with Byrum. I do not see any documentation of conversation about changing providers. Routing to Dr. Shelah as RICK.

## 2023-11-04 NOTE — Telephone Encounter (Signed)
 [name] from [employer] plan sponsor under [employer] we are calling in regards to a patient who has a appointmen tomorrow morning at 10:15 and she wants to see dr byrum and she is being told her appointment is not with dr byrum . Trying to see dr byrum is going to approve to see her . She is dr wert patient . Tried to relay to patient but she wouldnt listen and started going off on me. She is needing to speak with someone about the appointment . She say can't no one tell her who she can see . Patient says she heard the phone call earlier with cma leslie after she was suppose to hang up and patient heard every word they were saying about her . I am still on the phone with patient and insurance company  . When she received the notification of transfer of care with dr byrum tomorrow . So she knows her appointment for tomorrow is set .

## 2023-11-05 ENCOUNTER — Encounter: Payer: Self-pay | Admitting: Emergency Medicine

## 2023-11-05 ENCOUNTER — Ambulatory Visit: Admitting: Emergency Medicine

## 2023-11-05 VITALS — BP 120/68 | HR 58 | Ht 63.0 in | Wt 242.8 lb

## 2023-11-05 DIAGNOSIS — G4733 Obstructive sleep apnea (adult) (pediatric): Secondary | ICD-10-CM | POA: Diagnosis not present

## 2023-11-05 DIAGNOSIS — R0609 Other forms of dyspnea: Secondary | ICD-10-CM

## 2023-11-05 DIAGNOSIS — K219 Gastro-esophageal reflux disease without esophagitis: Secondary | ICD-10-CM | POA: Diagnosis not present

## 2023-11-05 DIAGNOSIS — J45991 Cough variant asthma: Secondary | ICD-10-CM

## 2023-11-05 DIAGNOSIS — J310 Chronic rhinitis: Secondary | ICD-10-CM | POA: Diagnosis not present

## 2023-11-05 NOTE — Progress Notes (Signed)
 Subjective:    Patient ID: [name], female    DOB: 1960-07-01, 63 y.o.   MRN: [id number]  HPI   ROV 11/05/2023 --63 year old woman, never smoker with OSA (not on CPAP), GERD, chronic rhinitis, chronic cough.  Also with hypertension and diastolic dysfunction and HFpEF.  I saw her with flaring cough in July 2025.  I treated her with azithromycin  and prednisone , Lasix  for 3 days.  Continued her on her spironolactone , budesonide /albuterol  nebs twice daily.  Continued her on Singulair  and fluticasone  nasal spray, Pepcid  and Dexilant . She reports today that she improved in July, less mucous and cough. Began to have increased frontal sinus fullness, eye sx beginning about 4 days ago. She was just traveling to [place] . She is having increased drainage to her throat. She is using flonase  but only as needed. On singulair  reliably. She has some cough w white mucous. Remains on nebs - unsure whether they help her.    Review of Systems As per HPI  Past Medical History:  Diagnosis Date   Acute on chronic diastolic CHF (congestive heart failure) (HCC) 04/11/2014   Acute suppurative otitis media without spontaneous rupture of eardrum 02/04/2007   Centricity Description: OTITIS MEDIA, SUPPURATIVE, ACUTE, BILATERAL Qualifier: Diagnosis of  By: Kassie MD, Alyce LABOR  Centricity Description: OTITIS MEDIA, SUPPURATIVE, ACUTE Qualifier: Diagnosis of  By: Kassie MD, Sean A    Allergic rhinitis    Allergy     Anemia    Anxiety    Arthritis    Atherosclerosis of aorta 02/21/2021   CAD (coronary artery disease)    LHC (11/29/1999):  EF 60%; no significant CAD.   Carpal tunnel syndrome    Carpal tunnel syndrome 06/09/2006   Qualifier: Diagnosis of  By: Wilhemina RMA, Lucy     Chronic diastolic CHF (congestive heart failure) (HCC)    a. Echo (01/21/11): Vigorous LVF, EF 65-70%, no RWMA, Gr 2 DD. b.  Echo (12/15): Moderate LVH, EF 60-65%, grade 2 diastolic dysfunction, mild LAE, normal RV function c. 01/2015:  echo showing EF of 60-65% with mild LVH   Common migraine    COPD (chronic obstructive pulmonary disease) (HCC)    COVID-19    x 2   Diastolic heart failure (HCC)    Diverticulitis    Dyspnea    Hepatic steatosis    Hepatomegaly    Hiatal hernia    Hyperlipidemia    IBS (irritable bowel syndrome)    Internal hemorrhoids    Long COVID    Metabolic syndrome    Morbid obesity (HCC)    PNEUMONIA, ORGAN UNSPECIFIED 03/01/2009   Qualifier: Diagnosis of  By: Thalia CMA, Jennifer     Sinusitis, chronic 03/10/2012   CT sinuses 02/2012:  Chronic sinusitis with small A/F levels >> rx by ENT with prolonged augmentin  F/u ct sinuses 04/2012:  Persistent sinus thickening >> rx with levaquin  and clinda for 30 days.  Told to return for sinus scan in 6 weeks >> no showed for followup visit.  CT sinuses 11/2013:  Acute and chronic sinusitis noted.      Family History  Problem Relation Age of Onset   Heart attack Mother 31       1st one @ 46, 2nd one 71   Heart disease Mother    Hypertension Mother    Asthma Sister    Colon polyps Sister    Diabetes Sister    Diabetes Sister    Diabetes Brother    Lung cancer Brother  Diabetes Brother    Diabetes Brother    Diabetes Brother    Diabetes Half-Sister    Stroke Neg Hx    Colon cancer Neg Hx    Esophageal cancer Neg Hx    Rectal cancer Neg Hx    Stomach cancer Neg Hx    Pancreatic cancer Neg Hx      Social History   Socioeconomic History   Marital status: Widowed    Spouse name: Not on file   Number of children: 2   Years of education: Not on file   Highest education level: Not on file  Occupational History   Occupation: Retired  Tobacco Use   Smoking status: Never   Smokeless tobacco: Never  Vaping Use   Vaping status: Never Used  Substance and Sexual Activity   Alcohol use: No   Drug use: No   Sexual activity: Not on file  Other Topics Concern   Not on file  Social History Narrative   Physicist, medical, Lawyer.   Widowed, lives with son.   Social Drivers of Corporate investment banker Strain: Low Risk (10/15/2023)   [facility] from [facility]   Financial Resource Strain    In the past 12 months has the electric, gas, oil, or water company threatened to shut off services in your home?: No    Financial Resource Strain Assistance: Not on file  Food Insecurity: Low Risk (10/15/2023)   [facility] from [facility]   Food Insecurity    In the last 12 months, did you ever eat less than you felt you should because there wasn't enough money for food?: Never true    Food Insecurity Assistance: Not on file  Transportation Needs: Low Risk (10/15/2023)   Received from [facility]   Transportation Needs    Do you put off or neglect going to the doctor because of distance or transportation?: No    Transportation Needs Assistance: Not on file  Physical Activity: Inactive (06/02/2020)   Received from [facility] visits prior to 03/23/2022.   Exercise Vital Sign    On average, how many days per week do you engage in moderate to strenuous exercise (like a brisk walk)?: 0 days    On average, how many minutes do you engage in exercise at this level?: 0 min  Stress: Stress Concern Present (06/02/2020)   Received from [facility] visits prior to 03/23/2022.   [employer] of Occupational Health - Occupational Stress Questionnaire    Feeling of Stress : Very much  Social Connections: Low Risk (10/15/2023)   [facility]   Social Connections    In the last 12 months, were you satisfied with the amount of time you were able to connect with family, friends, or neighbors?: Yes    Social Connections Assistance: Not on file  Intimate Partner Violence: Not At Risk (06/02/2020)   Received from [facility] visits prior to 03/23/2022.    Humiliation, Afraid, Rape, and Kick questionnaire    Within the last year, have you been afraid of your partner or ex-partner?: No    Within the last year, have you been humiliated or emotionally abused in other ways by your partner or ex-partner?: No    Within the last year, have you been kicked, hit, slapped, or otherwise physically hurt by your partner or ex-partner?: No  Within the last year, have you been raped or forced to have any kind of sexual activity by your partner or ex-partner?: No    Allergies  Allergen Reactions   Sulfonamide Derivatives Anaphylaxis, Swelling, Rash and Other (See Comments)    Swelling tongue and ear    Doxycycline  Nausea And Vomiting   Latex Hives   Ciprofloxacin Rash   Fish Allergy  Rash and Other (See Comments)    Only reaction to Mackerel.  No other issues with any type of fish or shellfish currently    Hydrocortisone Rash   Neomycin Rash    Current Outpatient Medications on File Prior to Visit  Medication Sig Dispense Refill   acetaminophen  (TYLENOL ) 650 MG CR tablet Take 650 mg by mouth every 8 (eight) hours as needed for pain (headache).     albuterol  (PROVENTIL ) (2.5 MG/3ML) 0.083% nebulizer solution Take 3 mLs (2.5 mg total) by nebulization in the morning, at noon, in the evening, and at bedtime. 360 mL 3   ALPHAGAN P 0.1 % SOLN Apply 1 drop to eye 3 (three) times daily.     benzonatate  (TESSALON ) 200 MG capsule Take 200 mg by mouth 3 (three) times daily.     budesonide  (PULMICORT ) 0.25 MG/2ML nebulizer solution Inhale 2 mLs into the lungs.     dexlansoprazole  (DEXILANT ) 60 MG capsule TAKE 1 CAPSULE BY MOUTH EVERY DAY 90 capsule 3   dicyclomine  (BENTYL ) 10 MG capsule Take 1 capsule (10 mg total) by mouth every 8 (eight) hours as needed for spasms. 60 capsule 1   docusate sodium  (COLACE) 100 MG capsule Take 100 mg by mouth 2 (two) times daily.     EPINEPHrine  0.3 mg/0.3 mL IJ SOAJ injection Inject 0.3 mg into the muscle as needed for anaphylaxis.      ergocalciferol  (VITAMIN D2) 1.25 MG (50000 UT) capsule Take 50,000 Units by mouth once a week. Fridays     famotidine  (PEPCID ) 20 MG tablet TAKE 1 TABLET BY MOUTH AFTER SUPPER EVERY DAY 90 tablet 3   FARXIGA  5 MG TABS tablet Take 5 mg by mouth daily.     linaclotide  (LINZESS ) 290 MCG CAPS capsule Take 1 capsule (290 mcg total) by mouth daily before breakfast. 30 capsule 2   montelukast  (SINGULAIR ) 10 MG tablet TAKE 1 TABLET(10 MG) BY MOUTH AT BEDTIME 30 tablet 1   potassium chloride  SA (KLOR-CON  M) 20 MEQ tablet Take 1 tablet (20 mEq total) by mouth 2 (two) times daily. 180 tablet 3   Respiratory Therapy Supplies (FLUTTER) DEVI 1 application by Does not apply route as directed. 1 each 0   rosuvastatin  (CRESTOR ) 10 MG tablet Take 10 mg by mouth daily.     spironolactone  (ALDACTONE ) 50 MG tablet Take 50 mg by mouth daily.     topiramate  (TOPAMAX ) 25 MG tablet Take 25 mg by mouth 2 (two) times daily.     torsemide  (DEMADEX ) 20 MG tablet Take 20 mg by mouth daily.     traMADol  (ULTRAM ) 50 MG tablet Take 1 tablet (50 mg total) by mouth every 6 (six) hours as needed. 15 tablet 0   fluticasone  (FLONASE ) 50 MCG/ACT nasal spray ADMINISTER 2 SPRAYS INTO EACH NOSTRIL 2 TIMES A DAY. (Patient not taking: Reported on 11/05/2023) 48 mL 1   predniSONE  (DELTASONE ) 10 MG tablet Take 40mg  daily for 3 days, then 30mg  daily for 3 days, then 20mg  daily for 3 days, then 10mg  daily for 3 days, then stop 30 tablet 0   No current  facility-administered medications on file prior to visit.         Objective:   Physical Exam  Vitals:   11/05/23 1002  BP: 120/68  Pulse: (!) 58  SpO2: 96%  Weight: 242 lb 12.8 oz (110.1 kg)  Height: 5' 3 (1.6 m)   Gen: Pleasant, obese woman in no distress,  normal affect  ENT: No lesions,  mouth clear,  oropharynx clear, no postnasal drip  Neck: No JVD, no stridor  Lungs: No use of accessory muscles, she coughs on a deep inspiration.  No wheezing  Cardiovascular: RRR,  heart sounds normal, no murmur or gallops, trace peripheral edema  Musculoskeletal: No deformities, no cyanosis or clubbing  Neuro: alert, awake, non focal  Skin: Warm, no lesions or rash     Assessment & Plan:  Cough variant asthma Not clear to me how much of this is upper airway disease versus true asthma.  We need to continue to work on treating the potential upper airway irritants including GERD, rhinitis.  She may be able to come off the budesonide  nebs.  Would likely be beneficial to optimize the above and obtain PFT to look for any evidence of significant obstruction.    Continue your Singulair  10 mg each evening We will continue your budesonide  and albuterol  nebulizer treatment for now.  Depending on how you are doing going forward we may be able to try stopping the budesonide . You would benefit from getting full pulmonary function testing at some point in the future.  We can talk about arranging this at your next visit. Please follow Dr. Shelah in about 3 months.  Call sooner if you are having problems.  Obstructive sleep apnea Not currently on CPAP.  Unable to tolerate  Gastroesophageal reflux disease Contributing to her cough.  Working to treat aggressively.  Please continue your Dexilant  and your Pepcid  as you have been taking them  Chronic rhinitis Continue your Singulair  10 mg each evening Please start loratadine  10 mg (generic Claritin ) once daily.  You can get this over-the-counter Start taking your fluticasone  nasal spray, 2 sprays each nostril once daily on a schedule (every day).  At some point we may be able to go back to using it on an as needed basis    Lamar Shelah, MD, PhD 11/07/2023, 1:13 PM [facility] [phone] or if no answer before 7:00PM call [phone] For any issues after 7:00PM please call eLink [phone]

## 2023-11-05 NOTE — Patient Instructions (Signed)
 Please continue your Dexilant  and your Pepcid  as you have been taking them Continue your Singulair  10 mg each evening Please start loratadine  10 mg (generic Claritin ) once daily.  You can get this over-the-counter Start taking your fluticasone  nasal spray, 2 sprays each nostril once daily on a schedule (every day).  At some point we may be able to go back to using it on an as needed basis We will continue your budesonide  and albuterol  nebulizer treatment for now.  Depending on how you are doing going forward we may be able to try stopping the budesonide . You would benefit from getting full pulmonary function testing at some point in the future.  We can talk about arranging this at your next visit. Please follow Dr. Shelah in about 3 months.  Call sooner if you are having problems.

## 2023-11-07 DIAGNOSIS — J310 Chronic rhinitis: Secondary | ICD-10-CM | POA: Insufficient documentation

## 2023-11-07 NOTE — Assessment & Plan Note (Signed)
 Not clear to me how much of this is upper airway disease versus true asthma.  We need to continue to work on treating the potential upper airway irritants including GERD, rhinitis.  She may be able to come off the budesonide  nebs.  Would likely be beneficial to optimize the above and obtain PFT to look for any evidence of significant obstruction.    Continue your Singulair  10 mg each evening We will continue your budesonide  and albuterol  nebulizer treatment for now.  Depending on how you are doing going forward we may be able to try stopping the budesonide . You would benefit from getting full pulmonary function testing at some point in the future.  We can talk about arranging this at your next visit. Please follow Dr. Shelah in about 3 months.  Call sooner if you are having problems.

## 2023-11-07 NOTE — Assessment & Plan Note (Signed)
 Contributing to her cough.  Working to treat aggressively.  Please continue your Dexilant  and your Pepcid  as you have been taking them

## 2023-11-07 NOTE — Assessment & Plan Note (Signed)
 Continue your Singulair  10 mg each evening Please start loratadine  10 mg (generic Claritin ) once daily.  You can get this over-the-counter Start taking your fluticasone  nasal spray, 2 sprays each nostril once daily on a schedule (every day).  At some point we may be able to go back to using it on an as needed basis

## 2023-11-07 NOTE — Assessment & Plan Note (Signed)
 Not currently on CPAP.  Unable to tolerate

## 2023-11-17 ENCOUNTER — Other Ambulatory Visit: Payer: Self-pay | Admitting: Gastroenterology

## 2023-11-18 ENCOUNTER — Ambulatory Visit: Payer: Self-pay | Admitting: Emergency Medicine

## 2023-11-18 NOTE — Telephone Encounter (Signed)
 Dr. Shelah, Do you want us  to advise this patient to be seen in the ED?  She can only do a video visit and I do not believe chest pain is appropriate for a video visit.  She has also vomited.  Please advise.  Thank you.

## 2023-11-18 NOTE — Telephone Encounter (Signed)
 FYI Only or Action Required?: Action required by provider: Pt was told to call if s/s remained - unsure if provider wants to see pt. PT will only be able to have a vv  Patient is followed in Pulmonology for DOE, last seen on 11/05/2023 by Shelah Lamar RAMAN, MD.  Called Nurse Triage reporting Chest Pain with deep breath. Pt vomited earlier and now feels better with less chest tightness with deep breath  Symptoms began monday.  Interventions attempted: OTC medications, also flutter and breathing treatment. Nebulizer treatments. Flutter  Symptoms are: gradually improving.  Triage Disposition: See PCP When Office is Open (Within 3 Days)  Patient/caregiver understands and will follow disposition?: Yes - unsure - pt waiting for response from provider regarding POC.              E2C2 Pulmonary Triage - Initial Assessment Questions "Chief Complaint (e.g., cough, sob, wheezing, fever, chills, sweat or additional symptoms) *Go to specific symptom protocol after initial questions. Chest pain with deep breath   "How long have symptoms been present?" 1 week  - from weds to Friday  Have you tested for COVID or Flu? Note: If not, ask patient if a home test can be taken. If so, instruct patient to call back for positive results. No  MEDICINES:   "Have you used any OTC meds to help with symptoms?" Yes If yes, ask "What medications?" Breathing treatments - otc medications  "Have you used your inhalers/maintenance medication?" No If yes, "What medications?" na  If inhaler, ask "How many puffs and how often?" Note: Review instructions on medication in the chart. na  OXYGEN: "Do you wear supplemental oxygen?" No If yes, "How many liters are you supposed to use?" na  "Do you monitor your oxygen levels?" No If yes, What is your reading (oxygen level) today? na  What is your usual oxygen saturation reading?  (Note: Pulmonary O2 sats should be 90% or greater) na             Copied from CRM #[id number]. Topic: Clinical - Red Word Triage >> Nov 18, 2023 10:01 AM [name] wrote: Red Word that prompted transfer to Nurse Triage: Patient [phone] states got a flu shot last Wednesday at [facility], feeling tired and sleepy on Wednesday, on Saturday started having headaches and toke Tylenol . Patient states last night, chest tightnesss and pain, patient did Mucinex , breathing and flutter treatments. Patient states today started throwing up and chest pain. Patient states Dr. Shelah advised to contact him if not any better. Patient denies fever, wheezing, nor dizziness. Please advise. Reason for Disposition  [1] Chest pain(s) lasting a few seconds from coughing AND [2] persists > 3 days  Answer Assessment - Initial Assessment Questions 1. LOCATION: Where does it hurt?       Chest - chest tightness with deep breath 2. RADIATION: Does the pain go anywhere else? (e.g., into neck, jaw, arms, back)     No - just chest 3. ONSET: When did the chest pain begin? (Minutes, hours or days)      Sunday 4. PATTERN: Does the pain come and go, or has it been constant since it started?  Does it get worse with exertion?      With a deep breath 5. DURATION: How long does it last (e.g., seconds, minutes, hours)     Just when she breathes 6. SEVERITY: How bad is the pain?  (e.g., Scale 1-10; mild, moderate, or severe)     Was  bad - better now that she has thrown up 7. CARDIAC RISK FACTORS: Do you have any history of heart problems or risk factors for heart disease? (e.g., angina, prior heart attack; diabetes, high blood pressure, high cholesterol, smoker, or strong family history of heart disease)     no 8. PULMONARY RISK FACTORS: Do you have any history of lung disease?  (e.g., blood clots in lung, asthma, emphysema, birth control pills)     Yes - long COVID 9. CAUSE: What do you think is causing the chest pain?     Long COVID - flu shot  reactions 10. OTHER SYMPTOMS: Do you have any other symptoms? (e.g., dizziness, nausea, vomiting, sweating, fever, difficulty breathing, cough)       Vomiting - congestion and HA  Protocols used: Chest Pain-A-AH

## 2023-11-18 NOTE — Telephone Encounter (Signed)
 I agree - if she is having chest pain she needs to be evaluated urgently. Thank you

## 2023-11-18 NOTE — Telephone Encounter (Signed)
 I called and spoke with patient, she verified she has had chest pain since Monday 10/27 and has now had vomiting.  I advised her that she needs to be seen in the ER that this is not something that we can treat in our office or over a video visit.  She could be having a cardiac problem like a heart attack.  She stated that she would go to the ER if it was not raining too bad because she cannot see to drive in the rain.  I advised her that this is a medical emergency and she should call ems if she is unable to drive herself.  She verbalized understanding.

## 2023-11-20 ENCOUNTER — Ambulatory Visit
Admission: RE | Admit: 2023-11-20 | Discharge: 2023-11-20 | Disposition: A | Discharge status: Home / Self Care | Source: Ambulatory Visit | Attending: Family Medicine | Admitting: Family Medicine

## 2023-11-20 DIAGNOSIS — Z1231 Encounter for screening mammogram for malignant neoplasm of breast: Secondary | ICD-10-CM

## 2023-11-27 ENCOUNTER — Other Ambulatory Visit: Payer: Self-pay | Admitting: Family Medicine

## 2023-11-27 DIAGNOSIS — Z1231 Encounter for screening mammogram for malignant neoplasm of breast: Secondary | ICD-10-CM

## 2023-11-28 ENCOUNTER — Other Ambulatory Visit: Payer: Self-pay

## 2023-11-28 MED ORDER — ALBUTEROL SULFATE (2.5 MG/3ML) 0.083% IN NEBU: 2.5000 mg | INHALATION_SOLUTION | Freq: Four times a day (QID) | RESPIRATORY_TRACT | 3 refills | Status: DC

## 2023-12-01 ENCOUNTER — Other Ambulatory Visit (HOSPITAL_COMMUNITY): Payer: Self-pay

## 2023-12-01 ENCOUNTER — Telehealth: Payer: Self-pay

## 2023-12-01 NOTE — Telephone Encounter (Signed)
*  Pulm  Pharmacy Patient Advocate Encounter   Received notification from CoverMyMeds that prior authorization for Albuterol  Sulfate (2.5 MG/3ML)0.083% nebulizer solution  is required/requested.   Insurance verification completed.   The patient is insured through [facility] .   Per test claim: Medication is not eligible for pharmacy benefits and must be billed through medical insurance. As our team only handles pharmacy related prior auths, medical PA's must be submitted by the clinic. Thank you

## 2023-12-10 MED ORDER — ALBUTEROL SULFATE (2.5 MG/3ML) 0.083% IN NEBU: 2.5000 mg | INHALATION_SOLUTION | Freq: Four times a day (QID) | RESPIRATORY_TRACT | 3 refills | Status: AC

## 2023-12-10 NOTE — Telephone Encounter (Signed)
 I spoke with our pharmacist, Aleck Puls and this needs to be submitted to Medicare Part B.  Dr. Shelah has signed the CMN and I have also sent a new prescription. CMN has been faxed and received confirmation.

## 2023-12-10 NOTE — Addendum Note (Signed)
 Addended byBETHA FRIES, Cristin Penaflor A on: 12/10/2023 04:59 PM   Modules accepted: Orders

## 2023-12-10 NOTE — Telephone Encounter (Signed)
 I called and spoke with the pharmacist at [facility], he states that the neb solution is requiring a PA.  I advised we had already sent it to our pharmacy team for a PA and it has to go through her medical plan not pharmacy.  Spoke with Caroline Herron Pharm D and she states she believes she needs a CMN from the pharmacy or a medical necessity form.  Routing to [place] as she has the CMN.  Thank you Marcus.

## 2023-12-29 ENCOUNTER — Other Ambulatory Visit: Payer: Self-pay | Admitting: Emergency Medicine

## 2024-02-18 ENCOUNTER — Ambulatory Visit: Admitting: Emergency Medicine

## 2024-03-17 ENCOUNTER — Ambulatory Visit: Admitting: Emergency Medicine
